# Patient Record
Sex: Female | Born: 1945 | Race: White | Hispanic: No | Marital: Married | Smoking: Former smoker
Health system: Southern US, Community
[De-identification: ages and names within clinical notes are randomized; demographics above are authoritative.]

## PROBLEM LIST (undated history)

## (undated) DIAGNOSIS — F419 Anxiety disorder, unspecified: Secondary | ICD-10-CM

## (undated) DIAGNOSIS — G4700 Insomnia, unspecified: Secondary | ICD-10-CM

## (undated) DIAGNOSIS — I214 Non-ST elevation (NSTEMI) myocardial infarction: Secondary | ICD-10-CM

## (undated) DIAGNOSIS — G4733 Obstructive sleep apnea (adult) (pediatric): Secondary | ICD-10-CM

## (undated) DIAGNOSIS — I472 Ventricular tachycardia: Secondary | ICD-10-CM

## (undated) DIAGNOSIS — Z87440 Personal history of urinary (tract) infections: Secondary | ICD-10-CM

## (undated) DIAGNOSIS — C50919 Malignant neoplasm of unspecified site of unspecified female breast: Secondary | ICD-10-CM

## (undated) DIAGNOSIS — R000 Tachycardia, unspecified: Secondary | ICD-10-CM

## (undated) DIAGNOSIS — R519 Headache, unspecified: Secondary | ICD-10-CM

## (undated) DIAGNOSIS — K5790 Diverticulosis of intestine, part unspecified, without perforation or abscess without bleeding: Secondary | ICD-10-CM

## (undated) DIAGNOSIS — I10 Essential (primary) hypertension: Secondary | ICD-10-CM

## (undated) DIAGNOSIS — M1990 Unspecified osteoarthritis, unspecified site: Secondary | ICD-10-CM

## (undated) DIAGNOSIS — R238 Other skin changes: Secondary | ICD-10-CM

## (undated) DIAGNOSIS — R112 Nausea with vomiting, unspecified: Secondary | ICD-10-CM

## (undated) DIAGNOSIS — R609 Edema, unspecified: Secondary | ICD-10-CM

## (undated) DIAGNOSIS — E785 Hyperlipidemia, unspecified: Secondary | ICD-10-CM

## (undated) DIAGNOSIS — Z8709 Personal history of other diseases of the respiratory system: Secondary | ICD-10-CM

## (undated) DIAGNOSIS — R600 Localized edema: Secondary | ICD-10-CM

## (undated) DIAGNOSIS — R0981 Nasal congestion: Secondary | ICD-10-CM

## (undated) DIAGNOSIS — Z79890 Hormone replacement therapy (postmenopausal): Secondary | ICD-10-CM

## (undated) DIAGNOSIS — K635 Polyp of colon: Secondary | ICD-10-CM

## (undated) DIAGNOSIS — J310 Chronic rhinitis: Secondary | ICD-10-CM

## (undated) DIAGNOSIS — R233 Spontaneous ecchymoses: Secondary | ICD-10-CM

## (undated) DIAGNOSIS — Z9889 Other specified postprocedural states: Secondary | ICD-10-CM

## (undated) DIAGNOSIS — T7840XA Allergy, unspecified, initial encounter: Secondary | ICD-10-CM

## (undated) DIAGNOSIS — K219 Gastro-esophageal reflux disease without esophagitis: Secondary | ICD-10-CM

## (undated) DIAGNOSIS — Z8719 Personal history of other diseases of the digestive system: Secondary | ICD-10-CM

## (undated) DIAGNOSIS — IMO0001 Reserved for inherently not codable concepts without codable children: Secondary | ICD-10-CM

## (undated) DIAGNOSIS — Z87448 Personal history of other diseases of urinary system: Secondary | ICD-10-CM

## (undated) DIAGNOSIS — J189 Pneumonia, unspecified organism: Secondary | ICD-10-CM

## (undated) DIAGNOSIS — Z9882 Breast implant status: Secondary | ICD-10-CM

## (undated) DIAGNOSIS — IMO0002 Reserved for concepts with insufficient information to code with codable children: Secondary | ICD-10-CM

## (undated) DIAGNOSIS — M109 Gout, unspecified: Secondary | ICD-10-CM

## (undated) DIAGNOSIS — I4729 Other ventricular tachycardia: Secondary | ICD-10-CM

## (undated) HISTORY — DX: Anxiety disorder, unspecified: F41.9

## (undated) HISTORY — DX: Gout, unspecified: M10.9

## (undated) HISTORY — DX: Ventricular tachycardia: I47.2

## (undated) HISTORY — PX: EXPLORATORY LAPAROTOMY: SUR591

## (undated) HISTORY — PX: ABDOMINAL HYSTERECTOMY: SHX81

## (undated) HISTORY — DX: Tachycardia, unspecified: R00.0

## (undated) HISTORY — PX: CHOLECYSTECTOMY: SHX55

## (undated) HISTORY — PX: OTHER SURGICAL HISTORY: SHX169

## (undated) HISTORY — PX: COSMETIC SURGERY: SHX468

## (undated) HISTORY — PX: APPENDECTOMY: SHX54

## (undated) HISTORY — PX: TONSILLECTOMY: SUR1361

## (undated) HISTORY — PX: CATARACT EXTRACTION, BILATERAL: SHX1313

## (undated) HISTORY — DX: Malignant neoplasm of unspecified site of unspecified female breast: C50.919

## (undated) HISTORY — DX: Essential (primary) hypertension: I10

## (undated) HISTORY — DX: Other ventricular tachycardia: I47.29

## (undated) HISTORY — DX: Reserved for inherently not codable concepts without codable children: IMO0001

## (undated) HISTORY — PX: COLONOSCOPY: SHX174

## (undated) HISTORY — DX: Breast implant status: Z98.82

## (undated) HISTORY — DX: Obstructive sleep apnea (adult) (pediatric): G47.33

## (undated) HISTORY — DX: Reserved for concepts with insufficient information to code with codable children: IMO0002

## (undated) HISTORY — DX: Polyp of colon: K63.5

## (undated) HISTORY — DX: Hyperlipidemia, unspecified: E78.5

## (undated) HISTORY — PX: GALLBLADDER SURGERY: SHX652

## (undated) HISTORY — DX: Unspecified osteoarthritis, unspecified site: M19.90

---

## 1997-08-17 HISTORY — PX: CARDIAC CATHETERIZATION: SHX172

## 2011-09-09 DIAGNOSIS — M719 Bursopathy, unspecified: Secondary | ICD-10-CM | POA: Diagnosis not present

## 2011-09-09 DIAGNOSIS — M67919 Unspecified disorder of synovium and tendon, unspecified shoulder: Secondary | ICD-10-CM | POA: Diagnosis not present

## 2011-09-09 DIAGNOSIS — M75120 Complete rotator cuff tear or rupture of unspecified shoulder, not specified as traumatic: Secondary | ICD-10-CM | POA: Diagnosis not present

## 2011-09-09 DIAGNOSIS — M79609 Pain in unspecified limb: Secondary | ICD-10-CM | POA: Diagnosis not present

## 2011-09-09 DIAGNOSIS — M25519 Pain in unspecified shoulder: Secondary | ICD-10-CM | POA: Diagnosis not present

## 2011-09-11 DIAGNOSIS — I472 Ventricular tachycardia: Secondary | ICD-10-CM | POA: Diagnosis not present

## 2011-09-11 DIAGNOSIS — J019 Acute sinusitis, unspecified: Secondary | ICD-10-CM | POA: Diagnosis not present

## 2011-09-11 DIAGNOSIS — G4733 Obstructive sleep apnea (adult) (pediatric): Secondary | ICD-10-CM | POA: Diagnosis not present

## 2011-09-11 DIAGNOSIS — I4729 Other ventricular tachycardia: Secondary | ICD-10-CM | POA: Diagnosis not present

## 2011-09-14 DIAGNOSIS — M25819 Other specified joint disorders, unspecified shoulder: Secondary | ICD-10-CM | POA: Diagnosis not present

## 2011-09-14 DIAGNOSIS — M6281 Muscle weakness (generalized): Secondary | ICD-10-CM | POA: Diagnosis not present

## 2011-09-14 DIAGNOSIS — M25619 Stiffness of unspecified shoulder, not elsewhere classified: Secondary | ICD-10-CM | POA: Diagnosis not present

## 2011-09-15 DIAGNOSIS — I472 Ventricular tachycardia: Secondary | ICD-10-CM | POA: Diagnosis not present

## 2011-09-15 DIAGNOSIS — I4729 Other ventricular tachycardia: Secondary | ICD-10-CM | POA: Diagnosis not present

## 2011-09-15 DIAGNOSIS — E7800 Pure hypercholesterolemia, unspecified: Secondary | ICD-10-CM | POA: Diagnosis not present

## 2011-09-15 DIAGNOSIS — G4733 Obstructive sleep apnea (adult) (pediatric): Secondary | ICD-10-CM | POA: Diagnosis not present

## 2011-09-15 DIAGNOSIS — R072 Precordial pain: Secondary | ICD-10-CM | POA: Diagnosis not present

## 2011-09-16 DIAGNOSIS — I472 Ventricular tachycardia: Secondary | ICD-10-CM | POA: Diagnosis not present

## 2011-09-16 DIAGNOSIS — M109 Gout, unspecified: Secondary | ICD-10-CM | POA: Diagnosis not present

## 2011-09-16 DIAGNOSIS — I4729 Other ventricular tachycardia: Secondary | ICD-10-CM | POA: Diagnosis not present

## 2011-09-16 DIAGNOSIS — R5381 Other malaise: Secondary | ICD-10-CM | POA: Diagnosis not present

## 2011-09-16 DIAGNOSIS — D510 Vitamin B12 deficiency anemia due to intrinsic factor deficiency: Secondary | ICD-10-CM | POA: Diagnosis not present

## 2011-09-16 DIAGNOSIS — R5383 Other fatigue: Secondary | ICD-10-CM | POA: Diagnosis not present

## 2011-09-16 DIAGNOSIS — E7800 Pure hypercholesterolemia, unspecified: Secondary | ICD-10-CM | POA: Diagnosis not present

## 2011-09-18 DIAGNOSIS — M6281 Muscle weakness (generalized): Secondary | ICD-10-CM | POA: Diagnosis not present

## 2011-09-18 DIAGNOSIS — M25819 Other specified joint disorders, unspecified shoulder: Secondary | ICD-10-CM | POA: Diagnosis not present

## 2011-09-18 DIAGNOSIS — G4733 Obstructive sleep apnea (adult) (pediatric): Secondary | ICD-10-CM | POA: Diagnosis not present

## 2011-09-18 DIAGNOSIS — I472 Ventricular tachycardia: Secondary | ICD-10-CM | POA: Diagnosis not present

## 2011-09-18 DIAGNOSIS — M25619 Stiffness of unspecified shoulder, not elsewhere classified: Secondary | ICD-10-CM | POA: Diagnosis not present

## 2011-09-18 DIAGNOSIS — R072 Precordial pain: Secondary | ICD-10-CM | POA: Diagnosis not present

## 2011-09-18 DIAGNOSIS — I4729 Other ventricular tachycardia: Secondary | ICD-10-CM | POA: Diagnosis not present

## 2011-09-20 DIAGNOSIS — I472 Ventricular tachycardia: Secondary | ICD-10-CM | POA: Diagnosis not present

## 2011-09-20 DIAGNOSIS — I4729 Other ventricular tachycardia: Secondary | ICD-10-CM | POA: Diagnosis not present

## 2011-09-21 DIAGNOSIS — M25619 Stiffness of unspecified shoulder, not elsewhere classified: Secondary | ICD-10-CM | POA: Diagnosis not present

## 2011-09-21 DIAGNOSIS — M25819 Other specified joint disorders, unspecified shoulder: Secondary | ICD-10-CM | POA: Diagnosis not present

## 2011-09-21 DIAGNOSIS — M6281 Muscle weakness (generalized): Secondary | ICD-10-CM | POA: Diagnosis not present

## 2011-09-22 DIAGNOSIS — I4729 Other ventricular tachycardia: Secondary | ICD-10-CM | POA: Diagnosis not present

## 2011-09-22 DIAGNOSIS — I472 Ventricular tachycardia: Secondary | ICD-10-CM | POA: Diagnosis not present

## 2011-09-22 DIAGNOSIS — R072 Precordial pain: Secondary | ICD-10-CM | POA: Diagnosis not present

## 2011-09-22 DIAGNOSIS — E7800 Pure hypercholesterolemia, unspecified: Secondary | ICD-10-CM | POA: Diagnosis not present

## 2011-09-25 DIAGNOSIS — M25619 Stiffness of unspecified shoulder, not elsewhere classified: Secondary | ICD-10-CM | POA: Diagnosis not present

## 2011-09-25 DIAGNOSIS — M6281 Muscle weakness (generalized): Secondary | ICD-10-CM | POA: Diagnosis not present

## 2011-09-25 DIAGNOSIS — M25819 Other specified joint disorders, unspecified shoulder: Secondary | ICD-10-CM | POA: Diagnosis not present

## 2011-09-30 DIAGNOSIS — M67919 Unspecified disorder of synovium and tendon, unspecified shoulder: Secondary | ICD-10-CM | POA: Diagnosis not present

## 2011-09-30 DIAGNOSIS — M719 Bursopathy, unspecified: Secondary | ICD-10-CM | POA: Diagnosis not present

## 2011-09-30 DIAGNOSIS — D485 Neoplasm of uncertain behavior of skin: Secondary | ICD-10-CM | POA: Diagnosis not present

## 2011-09-30 DIAGNOSIS — M25519 Pain in unspecified shoulder: Secondary | ICD-10-CM | POA: Diagnosis not present

## 2011-09-30 DIAGNOSIS — M25619 Stiffness of unspecified shoulder, not elsewhere classified: Secondary | ICD-10-CM | POA: Diagnosis not present

## 2011-09-30 DIAGNOSIS — M6281 Muscle weakness (generalized): Secondary | ICD-10-CM | POA: Diagnosis not present

## 2011-09-30 DIAGNOSIS — M25819 Other specified joint disorders, unspecified shoulder: Secondary | ICD-10-CM | POA: Diagnosis not present

## 2011-10-05 DIAGNOSIS — M25819 Other specified joint disorders, unspecified shoulder: Secondary | ICD-10-CM | POA: Diagnosis not present

## 2011-10-05 DIAGNOSIS — M25619 Stiffness of unspecified shoulder, not elsewhere classified: Secondary | ICD-10-CM | POA: Diagnosis not present

## 2011-10-05 DIAGNOSIS — M6281 Muscle weakness (generalized): Secondary | ICD-10-CM | POA: Diagnosis not present

## 2011-10-16 DIAGNOSIS — L905 Scar conditions and fibrosis of skin: Secondary | ICD-10-CM | POA: Diagnosis not present

## 2011-10-16 DIAGNOSIS — D485 Neoplasm of uncertain behavior of skin: Secondary | ICD-10-CM | POA: Diagnosis not present

## 2011-10-29 DIAGNOSIS — L723 Sebaceous cyst: Secondary | ICD-10-CM | POA: Diagnosis not present

## 2011-10-29 DIAGNOSIS — D485 Neoplasm of uncertain behavior of skin: Secondary | ICD-10-CM | POA: Diagnosis not present

## 2011-11-03 DIAGNOSIS — M25819 Other specified joint disorders, unspecified shoulder: Secondary | ICD-10-CM | POA: Diagnosis not present

## 2011-11-03 DIAGNOSIS — M25619 Stiffness of unspecified shoulder, not elsewhere classified: Secondary | ICD-10-CM | POA: Diagnosis not present

## 2011-11-03 DIAGNOSIS — M6281 Muscle weakness (generalized): Secondary | ICD-10-CM | POA: Diagnosis not present

## 2011-11-06 DIAGNOSIS — M6281 Muscle weakness (generalized): Secondary | ICD-10-CM | POA: Diagnosis not present

## 2011-11-06 DIAGNOSIS — M25819 Other specified joint disorders, unspecified shoulder: Secondary | ICD-10-CM | POA: Diagnosis not present

## 2011-11-06 DIAGNOSIS — M25619 Stiffness of unspecified shoulder, not elsewhere classified: Secondary | ICD-10-CM | POA: Diagnosis not present

## 2011-11-11 DIAGNOSIS — M25819 Other specified joint disorders, unspecified shoulder: Secondary | ICD-10-CM | POA: Diagnosis not present

## 2011-11-11 DIAGNOSIS — M25619 Stiffness of unspecified shoulder, not elsewhere classified: Secondary | ICD-10-CM | POA: Diagnosis not present

## 2011-11-11 DIAGNOSIS — M6281 Muscle weakness (generalized): Secondary | ICD-10-CM | POA: Diagnosis not present

## 2011-11-16 DIAGNOSIS — E7800 Pure hypercholesterolemia, unspecified: Secondary | ICD-10-CM | POA: Diagnosis not present

## 2011-11-16 DIAGNOSIS — I10 Essential (primary) hypertension: Secondary | ICD-10-CM | POA: Diagnosis not present

## 2011-11-16 DIAGNOSIS — M109 Gout, unspecified: Secondary | ICD-10-CM | POA: Diagnosis not present

## 2011-11-16 DIAGNOSIS — J019 Acute sinusitis, unspecified: Secondary | ICD-10-CM | POA: Diagnosis not present

## 2011-11-24 DIAGNOSIS — L820 Inflamed seborrheic keratosis: Secondary | ICD-10-CM | POA: Diagnosis not present

## 2011-11-26 DIAGNOSIS — M25819 Other specified joint disorders, unspecified shoulder: Secondary | ICD-10-CM | POA: Diagnosis not present

## 2011-11-26 DIAGNOSIS — M25619 Stiffness of unspecified shoulder, not elsewhere classified: Secondary | ICD-10-CM | POA: Diagnosis not present

## 2011-11-26 DIAGNOSIS — M6281 Muscle weakness (generalized): Secondary | ICD-10-CM | POA: Diagnosis not present

## 2011-11-27 DIAGNOSIS — M25819 Other specified joint disorders, unspecified shoulder: Secondary | ICD-10-CM | POA: Diagnosis not present

## 2011-11-27 DIAGNOSIS — M6281 Muscle weakness (generalized): Secondary | ICD-10-CM | POA: Diagnosis not present

## 2011-11-27 DIAGNOSIS — M25619 Stiffness of unspecified shoulder, not elsewhere classified: Secondary | ICD-10-CM | POA: Diagnosis not present

## 2011-12-01 DIAGNOSIS — R7401 Elevation of levels of liver transaminase levels: Secondary | ICD-10-CM | POA: Diagnosis not present

## 2011-12-01 DIAGNOSIS — R7402 Elevation of levels of lactic acid dehydrogenase (LDH): Secondary | ICD-10-CM | POA: Diagnosis not present

## 2012-01-19 DIAGNOSIS — Z7189 Other specified counseling: Secondary | ICD-10-CM | POA: Diagnosis not present

## 2012-01-19 DIAGNOSIS — E7800 Pure hypercholesterolemia, unspecified: Secondary | ICD-10-CM | POA: Diagnosis not present

## 2012-01-20 DIAGNOSIS — H26499 Other secondary cataract, unspecified eye: Secondary | ICD-10-CM | POA: Diagnosis not present

## 2012-01-20 DIAGNOSIS — H04129 Dry eye syndrome of unspecified lacrimal gland: Secondary | ICD-10-CM | POA: Diagnosis not present

## 2012-01-20 DIAGNOSIS — H43819 Vitreous degeneration, unspecified eye: Secondary | ICD-10-CM | POA: Diagnosis not present

## 2012-02-24 DIAGNOSIS — Z124 Encounter for screening for malignant neoplasm of cervix: Secondary | ICD-10-CM | POA: Diagnosis not present

## 2012-02-24 DIAGNOSIS — H26499 Other secondary cataract, unspecified eye: Secondary | ICD-10-CM | POA: Diagnosis not present

## 2012-02-24 DIAGNOSIS — Z1231 Encounter for screening mammogram for malignant neoplasm of breast: Secondary | ICD-10-CM | POA: Diagnosis not present

## 2012-02-24 DIAGNOSIS — M9981 Other biomechanical lesions of cervical region: Secondary | ICD-10-CM | POA: Diagnosis not present

## 2012-02-24 DIAGNOSIS — M542 Cervicalgia: Secondary | ICD-10-CM | POA: Diagnosis not present

## 2012-02-24 DIAGNOSIS — M25559 Pain in unspecified hip: Secondary | ICD-10-CM | POA: Diagnosis not present

## 2012-02-24 DIAGNOSIS — M999 Biomechanical lesion, unspecified: Secondary | ICD-10-CM | POA: Diagnosis not present

## 2012-02-28 DIAGNOSIS — IMO0001 Reserved for inherently not codable concepts without codable children: Secondary | ICD-10-CM | POA: Diagnosis not present

## 2012-04-11 DIAGNOSIS — H26499 Other secondary cataract, unspecified eye: Secondary | ICD-10-CM | POA: Diagnosis not present

## 2012-04-11 DIAGNOSIS — H43399 Other vitreous opacities, unspecified eye: Secondary | ICD-10-CM | POA: Diagnosis not present

## 2012-04-12 DIAGNOSIS — Z23 Encounter for immunization: Secondary | ICD-10-CM | POA: Diagnosis not present

## 2012-04-20 DIAGNOSIS — H04129 Dry eye syndrome of unspecified lacrimal gland: Secondary | ICD-10-CM | POA: Diagnosis not present

## 2012-06-09 ENCOUNTER — Ambulatory Visit (INDEPENDENT_AMBULATORY_CARE_PROVIDER_SITE_OTHER): Payer: Medicare Other | Admitting: Family Medicine

## 2012-06-09 VITALS — BP 136/74 | HR 73 | Temp 97.6°F | Resp 16

## 2012-06-09 DIAGNOSIS — J329 Chronic sinusitis, unspecified: Secondary | ICD-10-CM

## 2012-06-09 DIAGNOSIS — J310 Chronic rhinitis: Secondary | ICD-10-CM

## 2012-06-09 DIAGNOSIS — J029 Acute pharyngitis, unspecified: Secondary | ICD-10-CM | POA: Diagnosis not present

## 2012-06-09 DIAGNOSIS — M109 Gout, unspecified: Secondary | ICD-10-CM | POA: Insufficient documentation

## 2012-06-09 DIAGNOSIS — R000 Tachycardia, unspecified: Secondary | ICD-10-CM | POA: Insufficient documentation

## 2012-06-09 LAB — POCT RAPID STREP A (OFFICE): Rapid Strep A Screen: NEGATIVE

## 2012-06-09 MED ORDER — AMOXICILLIN 875 MG PO TABS: 875.0000 mg | ORAL_TABLET | Freq: Two times a day (BID) | ORAL | Status: DC

## 2012-06-09 MED ORDER — FLUTICASONE PROPIONATE 50 MCG/ACT NA SUSP: 2.0000 | Freq: Every day | NASAL | Status: DC

## 2012-06-09 NOTE — Progress Notes (Signed)
[facility]:  Office Visit  Chief Complaint:  Chief Complaint  Patient presents with  . Sore Throat    x 1 month  grandchildren have strep throat  . Adenopathy  . Headache    HPI: Tracey [name] is a 66 y.o. female who complains of  1 month h/o of sore throat, sinus tenderness, nasal congestion and HA. She ahs had strep exposure from grandchildren. Denies allergies or asthma. Recently moved here from [place]. They are living half time in [place] and also [place] so they can see their granchildren who are here.   Past Medical History  Diagnosis Date  . Arthritis   . Cataract   . Gout   . Tachycardia   . Hyperlipidemia   . Gout    Past Surgical History  Procedure Date  . Appendectomy   . Cosmetic surgery   . Gallbladder surgery   . Abdominal hysterectomy    History   Social History  . Marital Status: Married    Spouse Name: N/A    Number of Children: N/A  . Years of Education: N/A   Social History Main Topics  . Smoking status: Former Smoker    Start date: 06/09/1992  . Smokeless tobacco: None  . Alcohol Use: 3.0 oz/week    5 Glasses of wine per week  . Drug Use: No  . Sexually Active: Yes    Birth Control/ Protection: None   Other Topics Concern  . None   Social History Narrative  . None   Family History  Problem Relation Age of Onset  . Breast cancer Mother   . Cancer Mother   . Heart attack Father   . Heart disease Father   . ALS Sister   . Breast cancer Maternal Grandmother   . Heart attack Maternal Grandfather   . Heart attack Paternal Grandfather    Allergies  Allergen Reactions  . Ivp Dye (Iodinated Diagnostic Agents) Hives  . Sulfa Antibiotics Swelling   Prior to Admission medications   Medication Sig Start Date End Date Taking? Authorizing Provider  ALLOPURINOL PO Take 1 tablet by mouth daily.   Yes Historical Provider, MD  celecoxib (CELEBREX) 50 MG capsule Take 50 mg by mouth daily.   Yes Historical  Provider, MD  METOPROLOL TARTRATE PO Take 1 tablet by mouth daily.   Yes Historical Provider, MD  PRAVASTATIN SODIUM PO Take 1 tablet by mouth daily.   Yes Historical Provider, MD     ROS: The patient denies fevers, chills, night sweats, unintentional weight loss, chest pain, palpitations, wheezing, dyspnea on exertion, nausea, vomiting, abdominal pain, dysuria, hematuria, melena, numbness, weakness, or tingling.   All other systems have been reviewed and were otherwise negative with the exception of those mentioned in the HPI and as above.    PHYSICAL EXAM: Filed Vitals:   06/09/12 1120  BP: 136/74  Pulse: 73  Temp: 97.6 F (36.4 C)  Resp: 16   There were no vitals filed for this visit. There is no height or weight on file to calculate BMI.  General: Alert, no acute distress HEENT:  Normocephalic, atraumatic, oropharynx patent. Erythematous throat, + righ frontal sinus tenderness, Tm nl. No exudates Cardiovascular:  Regular rate and rhythm, no rubs murmurs or gallops.  No Carotid bruits, radial pulse intact. No pedal edema.  Respiratory: Clear to auscultation bilaterally.  No wheezes, rales, or rhonchi.  No cyanosis, no use of accessory musculature GI: No organomegaly, abdomen is soft  and non-tender, positive bowel sounds.  No masses. Skin: No rashes. Neurologic: Facial musculature symmetric. Psychiatric: Patient is appropriate throughout our interaction. Lymphatic: No cervical lymphadenopathy Musculoskeletal: Gait intact.   LABS: Results for orders placed in visit on 06/09/12  POCT RAPID STREP A (OFFICE)      Component Value Range   Rapid Strep A Screen Negative  Negative     EKG/XRAY:   Primary read interpreted by Dr. Conley Rolls at [facility].   ASSESSMENT/PLAN: Encounter Diagnoses  Name Primary?  . Pharyngitis Yes  . Sinusitis   . Rhinitis    Rx Amoxacillin 875 mg BID x 10 days for strep exposure and sinusitis Rx Flonase Rx Hydromet syrup F/u prn    Aneliz Carbary PHUONG,  DO 06/09/2012 12:54 PM

## 2012-07-22 DIAGNOSIS — G4733 Obstructive sleep apnea (adult) (pediatric): Secondary | ICD-10-CM | POA: Diagnosis not present

## 2012-07-22 DIAGNOSIS — K2900 Acute gastritis without bleeding: Secondary | ICD-10-CM | POA: Diagnosis not present

## 2012-07-22 DIAGNOSIS — M159 Polyosteoarthritis, unspecified: Secondary | ICD-10-CM | POA: Diagnosis not present

## 2012-07-22 DIAGNOSIS — I10 Essential (primary) hypertension: Secondary | ICD-10-CM | POA: Diagnosis not present

## 2012-07-22 DIAGNOSIS — E7800 Pure hypercholesterolemia, unspecified: Secondary | ICD-10-CM | POA: Diagnosis not present

## 2012-07-22 DIAGNOSIS — J019 Acute sinusitis, unspecified: Secondary | ICD-10-CM | POA: Diagnosis not present

## 2012-07-22 DIAGNOSIS — M109 Gout, unspecified: Secondary | ICD-10-CM | POA: Diagnosis not present

## 2012-07-28 DIAGNOSIS — J301 Allergic rhinitis due to pollen: Secondary | ICD-10-CM | POA: Diagnosis not present

## 2012-07-28 DIAGNOSIS — I10 Essential (primary) hypertension: Secondary | ICD-10-CM | POA: Diagnosis not present

## 2012-07-28 DIAGNOSIS — R7402 Elevation of levels of lactic acid dehydrogenase (LDH): Secondary | ICD-10-CM | POA: Diagnosis not present

## 2012-07-28 DIAGNOSIS — G4733 Obstructive sleep apnea (adult) (pediatric): Secondary | ICD-10-CM | POA: Diagnosis not present

## 2012-07-28 DIAGNOSIS — R7401 Elevation of levels of liver transaminase levels: Secondary | ICD-10-CM | POA: Diagnosis not present

## 2012-08-15 DIAGNOSIS — M659 Synovitis and tenosynovitis, unspecified: Secondary | ICD-10-CM | POA: Diagnosis not present

## 2012-08-15 DIAGNOSIS — M6530 Trigger finger, unspecified finger: Secondary | ICD-10-CM | POA: Diagnosis not present

## 2012-08-17 DIAGNOSIS — C50919 Malignant neoplasm of unspecified site of unspecified female breast: Secondary | ICD-10-CM

## 2012-08-17 HISTORY — DX: Malignant neoplasm of unspecified site of unspecified female breast: C50.919

## 2012-08-22 DIAGNOSIS — J019 Acute sinusitis, unspecified: Secondary | ICD-10-CM | POA: Diagnosis not present

## 2012-08-29 DIAGNOSIS — I789 Disease of capillaries, unspecified: Secondary | ICD-10-CM | POA: Diagnosis not present

## 2012-08-29 DIAGNOSIS — L659 Nonscarring hair loss, unspecified: Secondary | ICD-10-CM | POA: Diagnosis not present

## 2012-08-29 DIAGNOSIS — D485 Neoplasm of uncertain behavior of skin: Secondary | ICD-10-CM | POA: Diagnosis not present

## 2012-09-01 DIAGNOSIS — M25559 Pain in unspecified hip: Secondary | ICD-10-CM | POA: Diagnosis not present

## 2012-09-01 DIAGNOSIS — M25569 Pain in unspecified knee: Secondary | ICD-10-CM | POA: Diagnosis not present

## 2012-09-02 DIAGNOSIS — M25569 Pain in unspecified knee: Secondary | ICD-10-CM | POA: Diagnosis not present

## 2012-09-02 DIAGNOSIS — M1710 Unilateral primary osteoarthritis, unspecified knee: Secondary | ICD-10-CM | POA: Diagnosis not present

## 2012-09-02 DIAGNOSIS — M25469 Effusion, unspecified knee: Secondary | ICD-10-CM | POA: Diagnosis not present

## 2012-09-02 DIAGNOSIS — IMO0002 Reserved for concepts with insufficient information to code with codable children: Secondary | ICD-10-CM | POA: Diagnosis not present

## 2012-09-14 DIAGNOSIS — I789 Disease of capillaries, unspecified: Secondary | ICD-10-CM | POA: Diagnosis not present

## 2012-09-14 DIAGNOSIS — L678 Other hair color and hair shaft abnormalities: Secondary | ICD-10-CM | POA: Diagnosis not present

## 2012-09-14 DIAGNOSIS — D485 Neoplasm of uncertain behavior of skin: Secondary | ICD-10-CM | POA: Diagnosis not present

## 2012-09-14 DIAGNOSIS — L738 Other specified follicular disorders: Secondary | ICD-10-CM | POA: Diagnosis not present

## 2012-09-14 DIAGNOSIS — D2330 Other benign neoplasm of skin of unspecified part of face: Secondary | ICD-10-CM | POA: Diagnosis not present

## 2012-09-14 DIAGNOSIS — L259 Unspecified contact dermatitis, unspecified cause: Secondary | ICD-10-CM | POA: Diagnosis not present

## 2012-09-14 DIAGNOSIS — L650 Telogen effluvium: Secondary | ICD-10-CM | POA: Diagnosis not present

## 2012-11-10 DIAGNOSIS — L719 Rosacea, unspecified: Secondary | ICD-10-CM | POA: Diagnosis not present

## 2012-11-10 DIAGNOSIS — L820 Inflamed seborrheic keratosis: Secondary | ICD-10-CM | POA: Diagnosis not present

## 2012-11-10 DIAGNOSIS — D485 Neoplasm of uncertain behavior of skin: Secondary | ICD-10-CM | POA: Diagnosis not present

## 2012-11-25 DIAGNOSIS — R5382 Chronic fatigue, unspecified: Secondary | ICD-10-CM | POA: Diagnosis not present

## 2012-11-25 DIAGNOSIS — G9332 Myalgic encephalomyelitis/chronic fatigue syndrome: Secondary | ICD-10-CM | POA: Diagnosis not present

## 2013-01-25 ENCOUNTER — Ambulatory Visit: Payer: Medicare Other

## 2013-01-25 ENCOUNTER — Ambulatory Visit (INDEPENDENT_AMBULATORY_CARE_PROVIDER_SITE_OTHER): Payer: Medicare Other | Admitting: Family Medicine

## 2013-01-25 VITALS — BP 144/76 | HR 72 | Temp 98.0°F | Resp 17 | Ht 63.0 in | Wt 149.0 lb

## 2013-01-25 DIAGNOSIS — M19049 Primary osteoarthritis, unspecified hand: Secondary | ICD-10-CM | POA: Diagnosis not present

## 2013-01-25 DIAGNOSIS — M79609 Pain in unspecified limb: Secondary | ICD-10-CM

## 2013-01-25 DIAGNOSIS — M79641 Pain in right hand: Secondary | ICD-10-CM

## 2013-01-25 IMAGING — CR DG HAND COMPLETE 3+V*R*
3 series · 3 of 3 positions shown · non-contrast
Comparison: None

CLINICAL DATA: Right hand pain

RIGHT HAND - COMPLETE 3+ VIEW

[PA]
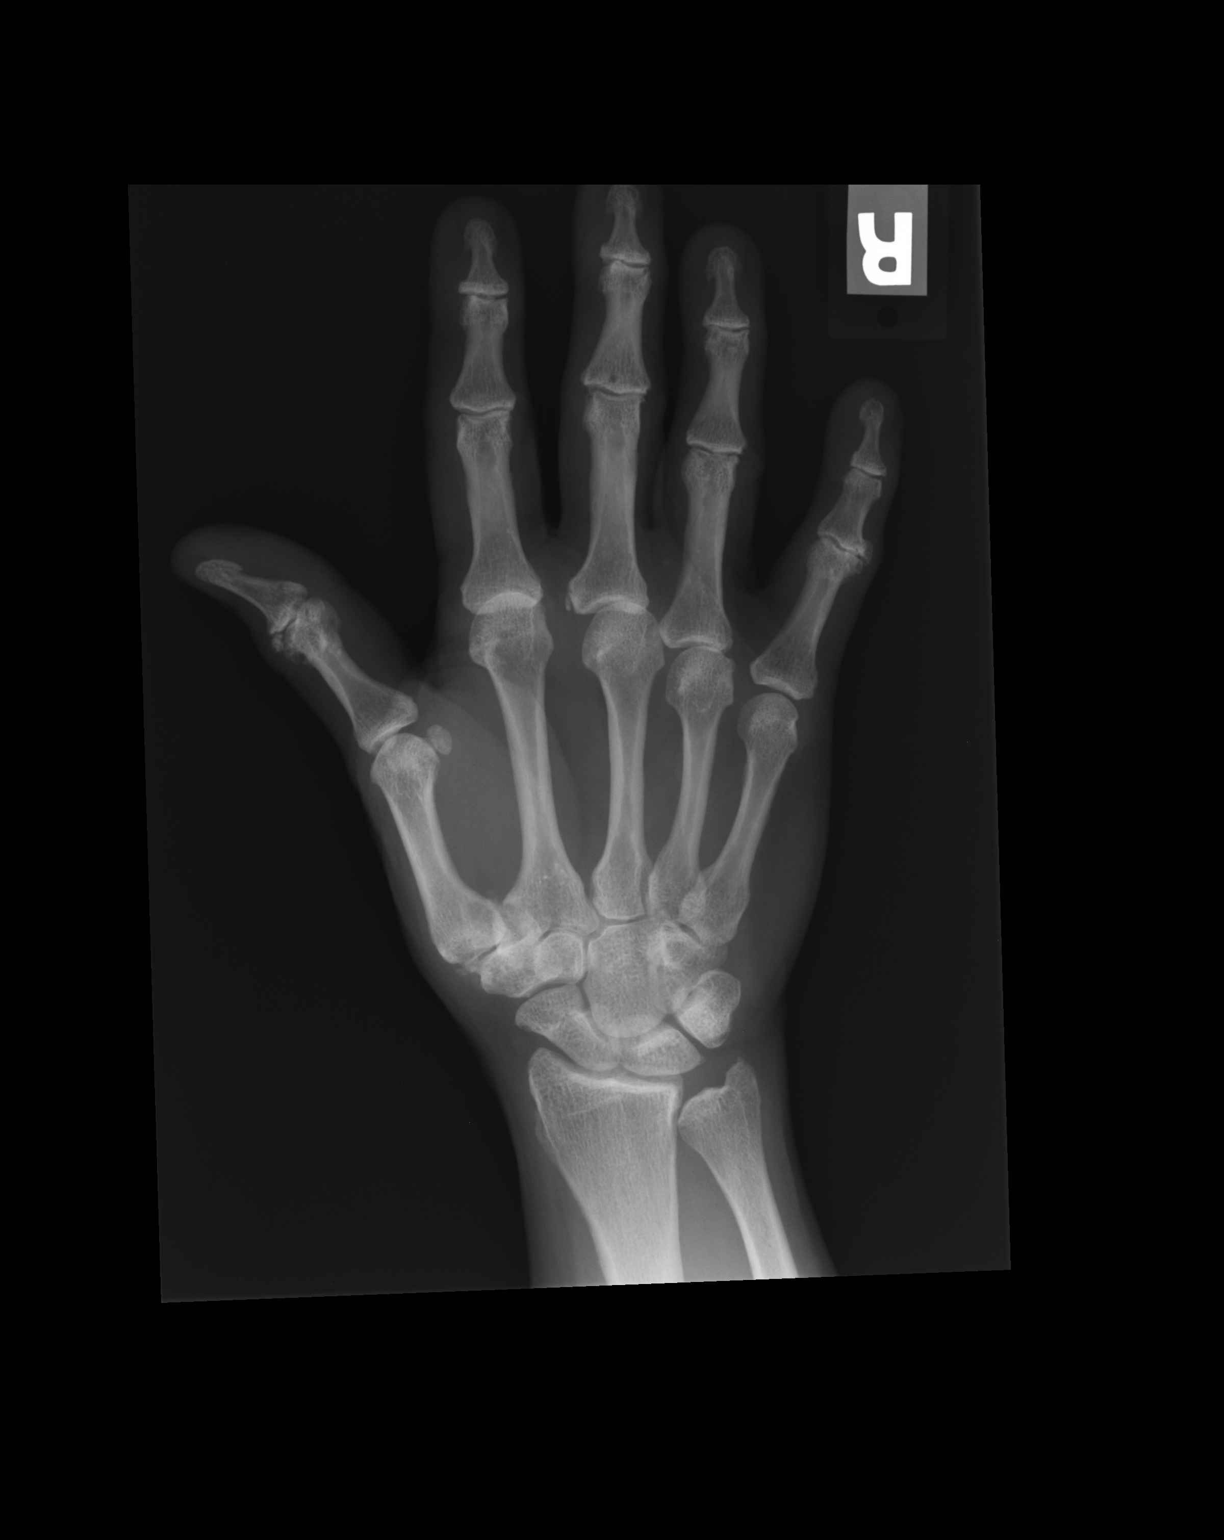

[lateral]
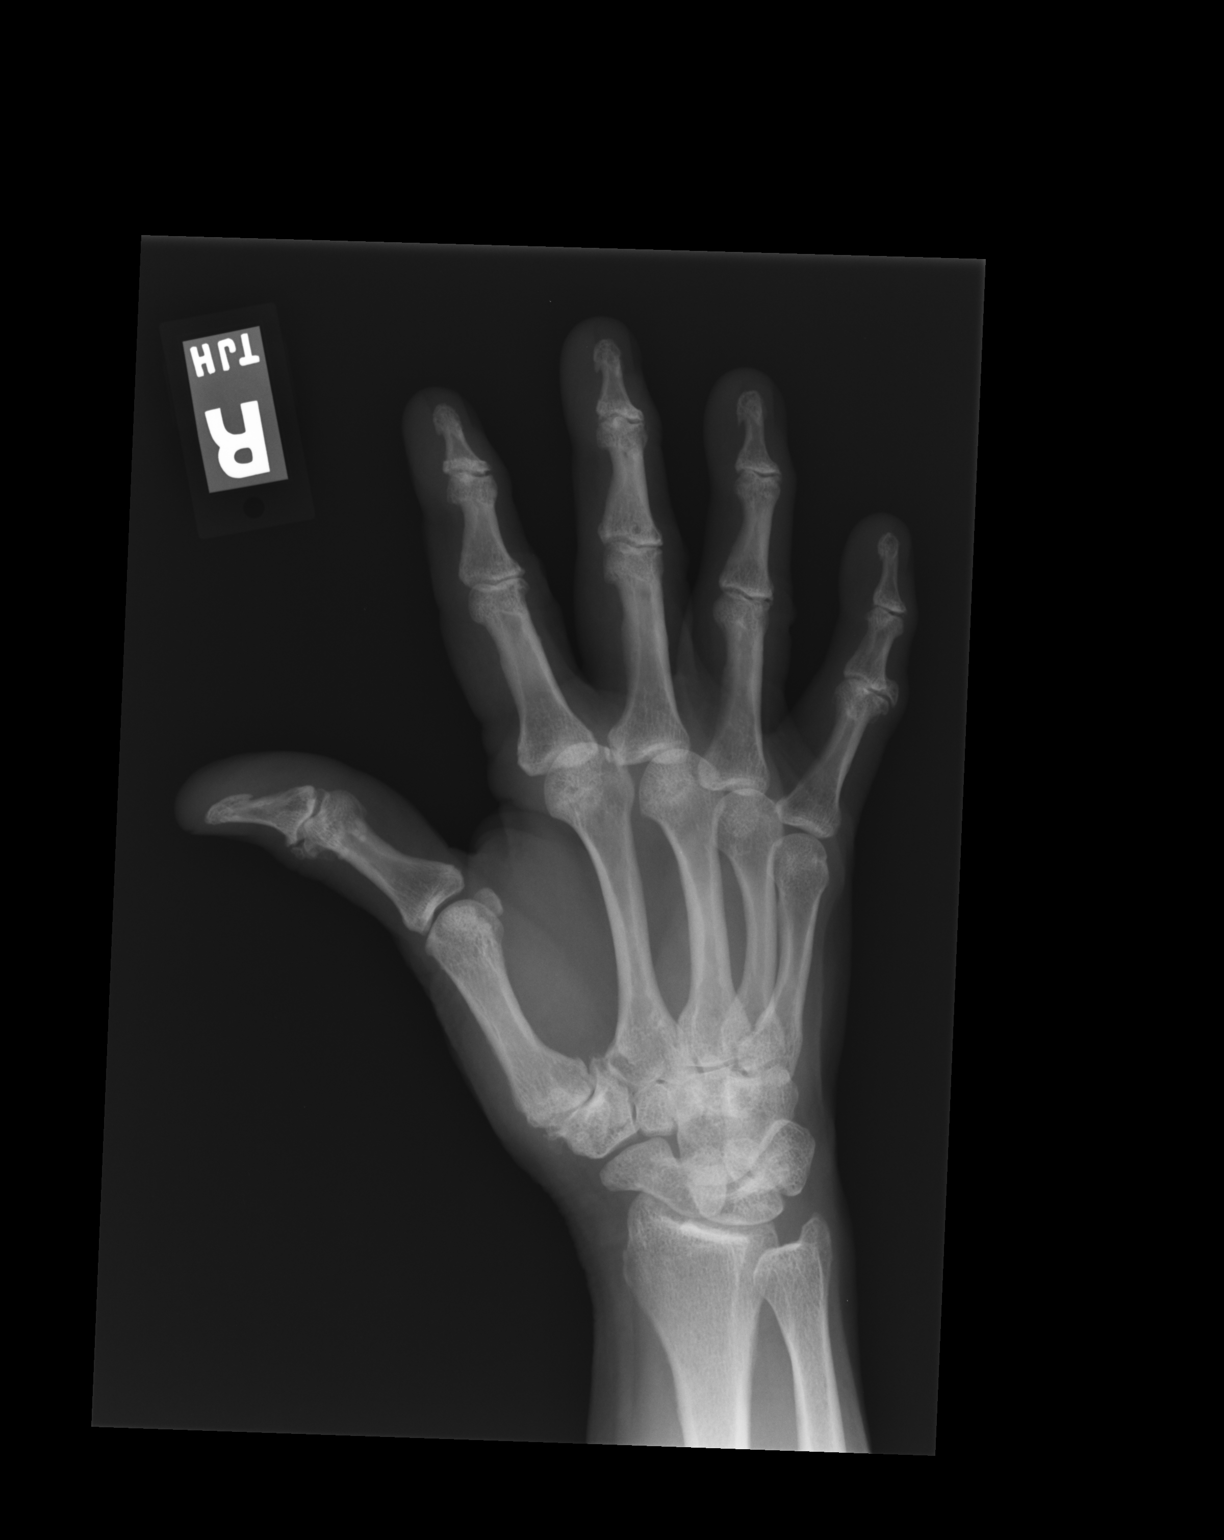

[pa obl]
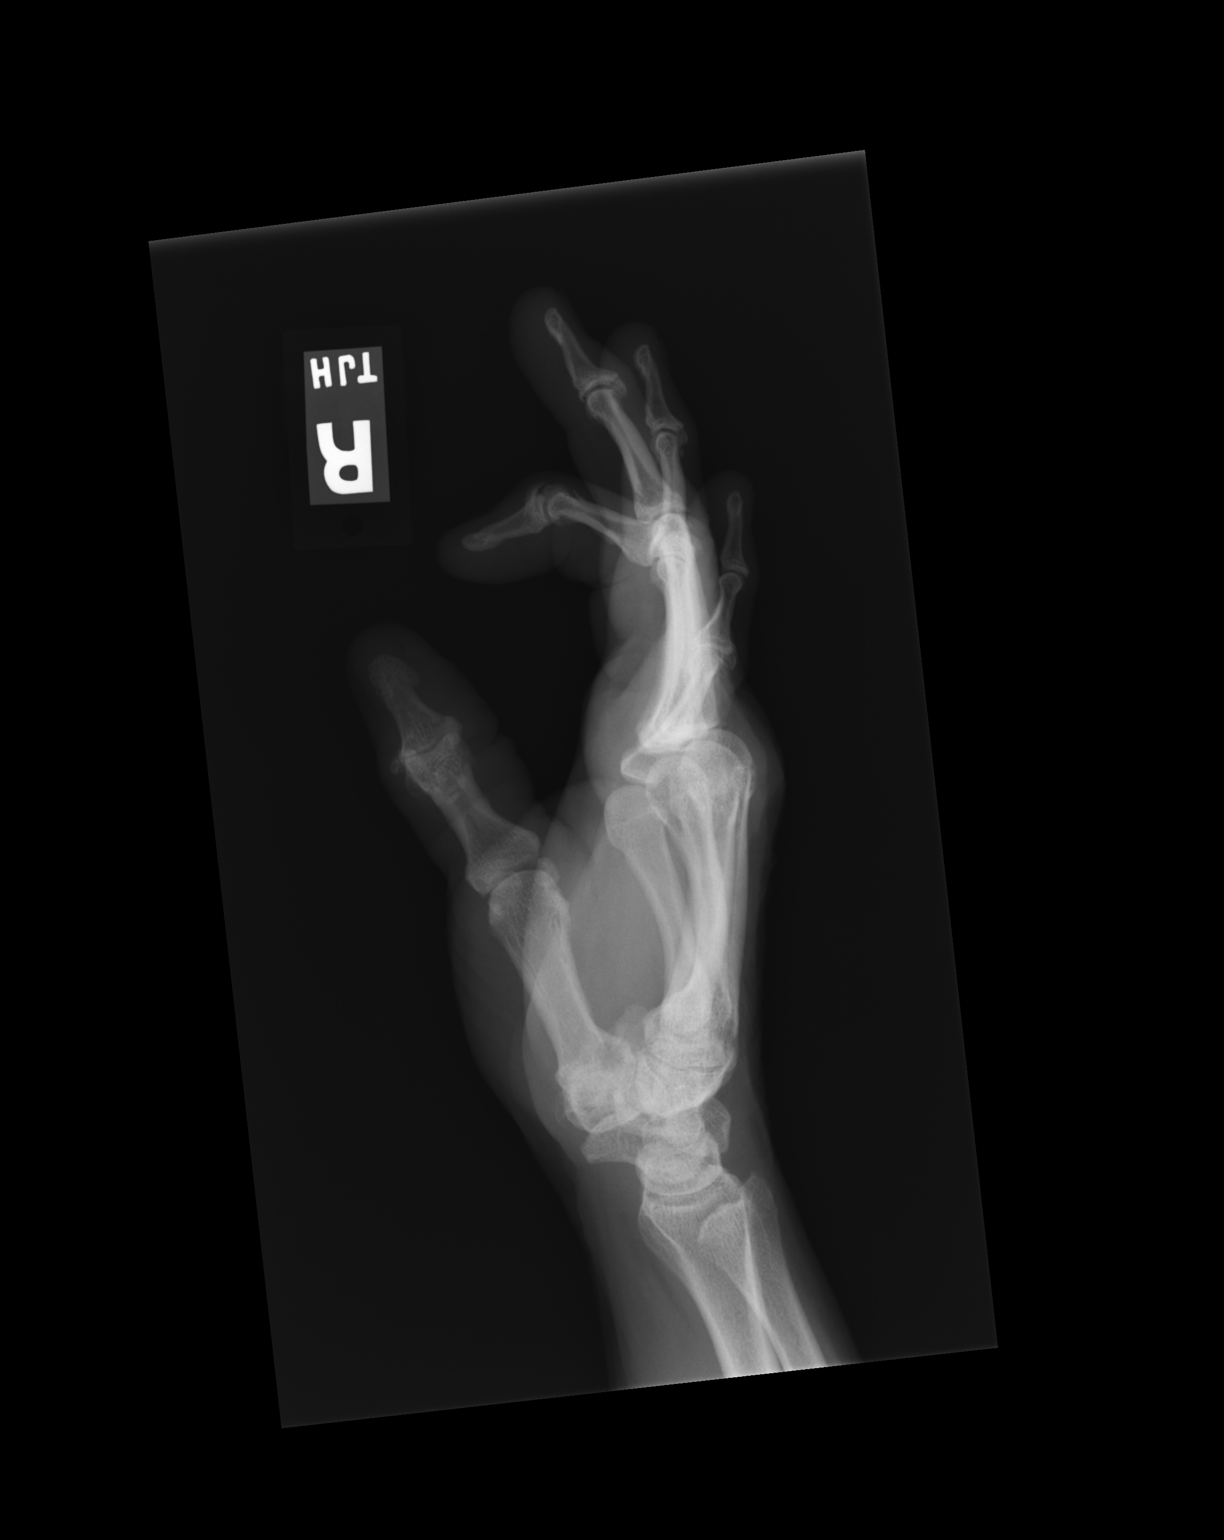

[3 of 3 positions shown; findings below may reference images not displayed]

FINDINGS: The soft tissues appear normal.  Bone mineralization is
within normal limits.  Mild osteoarthritis is noted involving the
DIP joints as well as the fifth PIP joint and the third and fourth
MCP joints.  There is also mild narrowing of the radiocarpal joint.
No acute fractures or subluxations identified.  No radiopaque
foreign bodies or soft tissue calcifications.  No focal bony
erosions.
IMPRESSION: 1.  Osteoarthritis.
2.  No acute findings.

Clinically significant discrepancy from primary report, if
provided: None

## 2013-01-25 NOTE — Progress Notes (Signed)
[facility] [address], [place] [address] [phone]  Date:  01/25/2013   Name:  Tracey [name]   DOB:  Jan 25, 1946   MRN:  [id number]  PCP:  No primary provider on file.    Chief Complaint: Hand Pain   History of Present Illness:  Tracey [name] is a 67 y.o. very pleasant female patient who presents with the following:  She is here today to evaluate right hand pain.   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    She notes pain in her right first MCP and right thumb- this seemed to occur after her husband helped her up from the floor by pulling on her right hand about one week ago.  It reminds her of when she had skier's thumb in the past.  It is difficult for her to turn a doorknob or otherwise rotate the wrist.   The hand will swell up and throb- the swelling comes and goes, generally worst over the thenar eminence and 2nd MCP joints.   She will be on a golf trip next week and is concerned that her hand pain will not allow her to pl She first had this problem in December of last year- she was given a cortisone shot in her hand.    Patient Active Problem List   Diagnosis Date Noted  . Gout   . Tachycardia     Past Medical History  Diagnosis Date  . Arthritis   . Cataract   . Gout   . Tachycardia   . Hyperlipidemia   . Gout     Past Surgical History  Procedure Laterality Date  . Appendectomy    . Cosmetic surgery    . Gallbladder surgery    . Abdominal hysterectomy    . Cholecystectomy      History  Substance Use Topics  . Smoking status: Former Smoker    Start date: 06/09/1992  . Smokeless tobacco: Not on file  . Alcohol Use: 3.0 oz/week    5 Glasses of wine per week    Family History  Problem Relation Age of Onset  . Breast cancer Mother   . Cancer Mother   . Heart attack Father   . Heart disease Father   . ALS Sister   . Breast cancer Maternal Grandmother   . Heart attack  Maternal Grandfather   . Heart attack Paternal Grandfather     Allergies  Allergen Reactions  . Ivp Dye (Iodinated Diagnostic Agents) Hives  . Sulfa Antibiotics Swelling    Medication list has been reviewed and updated.  Current Outpatient Prescriptions on File Prior to Visit  Medication Sig Dispense Refill  . ALLOPURINOL PO Take 1 tablet by mouth daily.      . celecoxib (CELEBREX) 50 MG capsule Take 50 mg by mouth daily.      . fluticasone (FLONASE) 50 MCG/ACT nasal spray Place 2 sprays into the nose daily.  16 g  0  . METOPROLOL TARTRATE PO Take 1 tablet by mouth daily.      Marland Kitchen PRAVASTATIN SODIUM PO Take 1 tablet by mouth daily.       No current facility-administered medications on file prior to visit.    Review of Systems:  As per HPI- otherwise negative.   Physical Examination: Filed Vitals:   01/25/13 0920  BP: 144/76  Pulse: 72  Temp: 98 F (36.7 C)  Resp:  17   Filed Vitals:   01/25/13 0920  Height: 5\' 3"  (1.6 m)  Weight: 149 lb (67.586 kg)   Body mass index is 26.4 kg/(m^2). Ideal Body Weight: Weight in (lb) to have BMI = 25: 140.8  GEN: WDWN, NAD, Non-toxic, A & O x 3, looks well HEENT: Atraumatic, Normocephalic. Neck supple. No masses, No LAD. Ears and Nose: No external deformity. CV: RRR, No M/G/R. No JVD. No thrill. No extra heart sounds. PULM: CTA B, no wheezes, crackles, rhonchi. No retractions. No resp. distress. No accessory muscle use. EXTR: No c/c/e NEURO Normal gait.  PSYCH: Normally interactive. Conversant. Not depressed or anxious appearing.  Calm demeanor.  Right hand: she has hypertrophy of the 2nd MCP joint. OA changes of the DIP joints diffusely with reduced flexion.  Slight ulnar deviation of the fingers.  Wrist is normal.    [facility] reading (PRIMARY) by  Dr. Patsy Lager. Right hand: degenerative changes at the 2nd MCP, and the IP joints especailly at the thumb Assessment and Plan: Hand pain, right - Plan: DG Hand Complete Right  Tracey [name] is  here with right hand pain today.  Suspect she would improve with rest and splinting, but she is eager to be able to play golf next week if possible. She will be seen by Dr. Merlyn Lot this afternoon at the [facility]- appreciate his kind consultation regarding this patient today.    Signed Abbe Amsterdam, MD

## 2013-01-25 NOTE — Patient Instructions (Addendum)
You will be seen at the [facility]- please arrive at 12:30 for a 1pm appt  [facility] Address: [address] [place], [place] [address] [phone]   You will see Dr. Betha Loa

## 2013-03-24 DIAGNOSIS — R928 Other abnormal and inconclusive findings on diagnostic imaging of breast: Secondary | ICD-10-CM | POA: Diagnosis not present

## 2013-03-24 DIAGNOSIS — Z124 Encounter for screening for malignant neoplasm of cervix: Secondary | ICD-10-CM | POA: Diagnosis not present

## 2013-03-24 DIAGNOSIS — Z1231 Encounter for screening mammogram for malignant neoplasm of breast: Secondary | ICD-10-CM | POA: Diagnosis not present

## 2013-03-24 DIAGNOSIS — C50919 Malignant neoplasm of unspecified site of unspecified female breast: Secondary | ICD-10-CM | POA: Diagnosis not present

## 2013-03-27 DIAGNOSIS — C50919 Malignant neoplasm of unspecified site of unspecified female breast: Secondary | ICD-10-CM | POA: Diagnosis not present

## 2013-03-29 DIAGNOSIS — C50919 Malignant neoplasm of unspecified site of unspecified female breast: Secondary | ICD-10-CM | POA: Diagnosis not present

## 2013-03-30 ENCOUNTER — Telehealth: Payer: Self-pay | Admitting: *Deleted

## 2013-03-30 DIAGNOSIS — C50111 Malignant neoplasm of central portion of right female breast: Secondary | ICD-10-CM

## 2013-03-30 DIAGNOSIS — C50119 Malignant neoplasm of central portion of unspecified female breast: Secondary | ICD-10-CM | POA: Insufficient documentation

## 2013-03-30 NOTE — Telephone Encounter (Signed)
Received message from patient stating to call her daughter [name] with her appt.  Called and spoke with [name] and confirmed [facility] appt.for 04/05/13 at 0800.  Instructions and contact information given.  Emailed intake form and before appt. Letter to her daughter [name].  I also have requested pathology slides and imaging from [facility] in [place].

## 2013-04-03 ENCOUNTER — Other Ambulatory Visit: Payer: Self-pay

## 2013-04-03 DIAGNOSIS — C50919 Malignant neoplasm of unspecified site of unspecified female breast: Secondary | ICD-10-CM | POA: Diagnosis not present

## 2013-04-05 ENCOUNTER — Other Ambulatory Visit (HOSPITAL_BASED_OUTPATIENT_CLINIC_OR_DEPARTMENT_OTHER): Payer: Medicare Other | Admitting: Lab

## 2013-04-05 ENCOUNTER — Ambulatory Visit
Admission: RE | Admit: 2013-04-05 | Discharge: 2013-04-05 | Disposition: A | Discharge status: Home / Self Care | Payer: Medicare Other | Source: Ambulatory Visit | Attending: Radiation Oncology | Admitting: Radiation Oncology

## 2013-04-05 ENCOUNTER — Encounter: Payer: Self-pay | Admitting: Oncology

## 2013-04-05 ENCOUNTER — Ambulatory Visit (HOSPITAL_BASED_OUTPATIENT_CLINIC_OR_DEPARTMENT_OTHER): Payer: Medicare Other | Admitting: General Surgery

## 2013-04-05 ENCOUNTER — Telehealth: Payer: Self-pay | Admitting: Oncology

## 2013-04-05 ENCOUNTER — Encounter: Payer: Self-pay | Admitting: *Deleted

## 2013-04-05 ENCOUNTER — Encounter (INDEPENDENT_AMBULATORY_CARE_PROVIDER_SITE_OTHER): Payer: Self-pay | Admitting: General Surgery

## 2013-04-05 ENCOUNTER — Ambulatory Visit: Payer: Medicare Other | Attending: General Surgery | Admitting: Physical Therapy

## 2013-04-05 ENCOUNTER — Ambulatory Visit (HOSPITAL_BASED_OUTPATIENT_CLINIC_OR_DEPARTMENT_OTHER): Payer: Medicare Other | Admitting: Oncology

## 2013-04-05 ENCOUNTER — Ambulatory Visit: Payer: Medicare Other

## 2013-04-05 VITALS — BP 147/80 | HR 76 | Temp 97.6°F | Resp 20 | Ht 63.0 in | Wt 152.1 lb

## 2013-04-05 DIAGNOSIS — C50919 Malignant neoplasm of unspecified site of unspecified female breast: Secondary | ICD-10-CM | POA: Diagnosis not present

## 2013-04-05 DIAGNOSIS — Z170 Estrogen receptor positive status [ER+]: Secondary | ICD-10-CM | POA: Diagnosis not present

## 2013-04-05 DIAGNOSIS — C50911 Malignant neoplasm of unspecified site of right female breast: Secondary | ICD-10-CM

## 2013-04-05 DIAGNOSIS — C50419 Malignant neoplasm of upper-outer quadrant of unspecified female breast: Secondary | ICD-10-CM

## 2013-04-05 DIAGNOSIS — IMO0001 Reserved for inherently not codable concepts without codable children: Secondary | ICD-10-CM | POA: Insufficient documentation

## 2013-04-05 DIAGNOSIS — R293 Abnormal posture: Secondary | ICD-10-CM | POA: Diagnosis not present

## 2013-04-05 DIAGNOSIS — C50111 Malignant neoplasm of central portion of right female breast: Secondary | ICD-10-CM

## 2013-04-05 DIAGNOSIS — C50119 Malignant neoplasm of central portion of unspecified female breast: Secondary | ICD-10-CM

## 2013-04-05 LAB — COMPREHENSIVE METABOLIC PANEL (CC13)
Albumin: 3.9 g/dL (ref 3.5–5.0)
CO2: 23 mEq/L (ref 22–29)
Calcium: 9.5 mg/dL (ref 8.4–10.4)
Glucose: 115 mg/dl (ref 70–140)
Potassium: 4.1 mEq/L (ref 3.5–5.1)
Sodium: 141 mEq/L (ref 136–145)
Total Bilirubin: 0.5 mg/dL (ref 0.20–1.20)
Total Protein: 7.4 g/dL (ref 6.4–8.3)

## 2013-04-05 LAB — CBC WITH DIFFERENTIAL/PLATELET
Eosinophils Absolute: 0.1 10*3/uL (ref 0.0–0.5)
LYMPH%: 39.3 % (ref 14.0–49.7)
MONO#: 0.4 10*3/uL (ref 0.1–0.9)
NEUT#: 2.4 10*3/uL (ref 1.5–6.5)
Platelets: 181 10*3/uL (ref 145–400)
RBC: 5.07 10*6/uL (ref 3.70–5.45)
WBC: 4.9 10*3/uL (ref 3.9–10.3)

## 2013-04-05 NOTE — Progress Notes (Signed)
Checked in new pt with no financial concerns. °

## 2013-04-05 NOTE — Progress Notes (Signed)
I met with the patient and her family today. She is interested in bilateral mastectomies. She understands the role of radiation after mastectomy is reserved for patients with tumor larger than 5 cm or positive lymph nodes none of which he has. She has no indications for radiation at this time. We'll be happy to meet with her postsurgically with her pathologic stage changes. It would be fine for her to proceed forward with immediate reconstruction.

## 2013-04-05 NOTE — Progress Notes (Signed)
Patient ID: Tracey [name], female   DOB: 1946/06/02, 67 y.o.   MRN: [id number]  No chief complaint on file.   HPI Tracey [name] is a 67 y.o. female.   HPI  She is self-referred. She has a new diagnosis of invasive ductal carcinoma of the right breast. She had screening detected abnormality in the right breast. She normally gets her mammograms in [place] and she was there for her mammogram. Biopsy was performed under image guidance and demonstrated invasive ductal carcinoma.  It is estrogen receptor positive. It is a grade 1. It is in the upper outer quadrant of the right breast it measures approximately 4 mm by ultrasound. She is a family history of breast cancer in her mother, grandmother, great grandmother, and maternal aunt. She does have some intermittent left breast pain and no nipple discharge. She has been on hormone replacement therapy for over 30 years. Age at first mesh. 12. Age of first rubber 24. She had a hysterectomy at age 16.  Past Medical History  Diagnosis Date  . Arthritis   . Cataract   . Gout   . Tachycardia   . Hyperlipidemia   . Gout     Past Surgical History  Procedure Laterality Date  . Appendectomy    . Gallbladder surgery    . Abdominal hysterectomy    . Cholecystectomy    . Cosmetic surgery      Family History  Problem Relation Age of Onset  . Breast cancer Mother   . Cancer Mother   . Heart attack Father   . Heart disease Father   . ALS Sister   . Breast cancer Maternal Grandmother   . Heart attack Maternal Grandfather   . Heart attack Paternal Grandfather     Social History History  Substance Use Topics  . Smoking status: Former Smoker    Start date: 06/09/1992  . Smokeless tobacco: Not on file  . Alcohol Use: 3.0 oz/week    5 Glasses of wine per week    Allergies  Allergen Reactions  . Ivp Dye [Iodinated Diagnostic Agents] Hives  . Sulfa Antibiotics Swelling    Current Outpatient Prescriptions  Medication Sig Dispense  Refill  . ALLOPURINOL PO Take 1 tablet by mouth daily.      . fluticasone (FLONASE) 50 MCG/ACT nasal spray Place 2 sprays into the nose daily.  16 g  0  . METOPROLOL TARTRATE PO Take 1 tablet by mouth daily.       No current facility-administered medications for this visit.    Review of Systems Review of Systems  Constitutional: Negative.   HENT: Negative.   Eyes: Negative.   Respiratory: Negative.   Cardiovascular: Positive for palpitations and leg swelling.  Gastrointestinal:       Heartburn.  Genitourinary: Negative.   Musculoskeletal: Positive for arthralgias.  Skin: Negative.        Bruise easily.  Neurological: Negative.   Hematological: Bruises/bleeds easily.  Psychiatric/Behavioral:       Anxious.    There were no vitals taken for this visit.  Physical Exam Physical Exam  Constitutional: No distress.  Overweight.  HENT:  Head: Normocephalic and atraumatic.  Eyes: No scleral icterus.  Neck: Neck supple.  No supraclavicular adenopathy.  Cardiovascular: Normal rate and regular rhythm.   Pulmonary/Chest: Effort normal and breath sounds normal.  Small healing wound upper-outer quadrant right breast. No palpable masses in the right breast. No palpable masses in the left breast. No nipple  discharge present.  Abdominal: Soft. She exhibits no distension and no mass. There is no tenderness.  Lower midline scar.  Musculoskeletal: She exhibits no edema.  Lymphadenopathy:    She has no cervical adenopathy.  Neurological: She is alert.  Skin: Skin is warm and dry.    Data Reviewed Mammogram, [place], Pathology.  Assessment    Invasive right breast cancer, T1 lesion. We discussed surgical options including breast conservation as well as mastectomy.  These operations were discussed with her as well as need for a sentinel lymph node biopsy.   She had discussed this previously with her family. She is leaning toward having bilateral mastectomies and is interested in  reconstruction. I explained to her she would need a right axilla sentinel lymph node biopsy as well.     Plan    Bilateral mastectomies, right axillary sentinel lymph node biopsy. She's also interested in reconstruction. We'll make a referral to a Engineer, petroleum.   I have explained the procedure, risks, and aftercare to her.  Risks include but are not limited to bleeding, infection, wound problems, anesthesia, chronic chest wall pain, nerve injury, seroma formation, lymphedema.  She seems to understand and agrees with the plan.       Kawthar Ennen J 04/05/2013, 10:06 AM

## 2013-04-05 NOTE — Progress Notes (Signed)
ID: Tracey [name] OB: 28-Jun-1946  MR#: [id number]  WUX#:[id number]  PCP: No PCP Per Patient GYN:   SU: Avel Peace OTHER MD: Lurline Hare, Larwance Sachs (FAX [phone])   HISTORY OF PRESENT ILLNESS: [name] had routine screening mammography July of 2014 showing a suspicious mass in her right breast. Additional views confirmed a mass in the upper outer quadrant of the right breast, which by ultrasound measured 4 mm. Biopsy of this mass was obtained in [place], at [facility]. It showed (accession number [id number]) an invasive ductal carcinoma measuring 6 mm on the biopsy, grade 1, strongly estrogen and progesterone receptor positive, HER-2 nonamplified.  The patient's subsequent history is as detailed below  INTERVAL HISTORY: Tracey [name] was seen at the multidisciplinary breast cancer clinic 04/05/2013 accompanied by her husband Tracey [name] and her daughter Tracey [name]  REVIEW OF SYSTEMS: There were no specific symptoms leading to the initial mammogram, which was routine. The patient plays golf twice a week has remained formal exercise. She does have a history of palpitations, including a round of V. tach which was noted during a sleep apnea study. She also gets palpitations with anxiety. She has had no further problems since starting metoprolol. She has heartburn issues, feels bloated, has occasional loose bowel movements, as scattered arthritic pains which are not more frequent or intense than usual, and has a history of gout him a controlled on allopurinol. Otherwise it detailed review of systems today was noncontributory  PAST MEDICAL HISTORY: Past Medical History  Diagnosis Date  . Arthritis   . Cataract   . Gout   . Tachycardia   . Hyperlipidemia   . Gout   . Anxiety     PAST SURGICAL HISTORY: Past Surgical History  Procedure Laterality Date  . Appendectomy    . Gallbladder surgery    . Abdominal hysterectomy    . Cholecystectomy    . Cosmetic surgery      FAMILY  HISTORY Family History  Problem Relation Age of Onset  . Breast cancer Mother   . Cancer Mother   . Heart attack Father   . Heart disease Father   . ALS Sister   . Breast cancer Maternal Grandmother   . Heart attack Maternal Grandfather   . Heart attack Paternal Grandfather    the patient's father died from heart disease at the age of 6. The patient's mother died from breast cancer at the age of 14. It is not clear when she was diagnosed since she "dictated and". The patient had no brothers. One sister died from ALS at the age of 85 in addition, one of the patient's mother is sisters was diagnosed with breast cancer in her 68s. The patient's mother is mother, was also diagnosed with breast cancer, at the age of 98. The patient's daughter, Tracey [name], has been tested for the BRCA genes and was negative.  GYNECOLOGIC HISTORY:  Menarche age 75, first live birth age 81, the patient is GX P2. She underwent total abdominal hysterectomy and bilateral salpingo-oophorectomy at the age of 60 for endometriosis. She took hormone replacement until August of 2014.  SOCIAL HISTORY:  Tracey [name] recently moved to [facility] and is planning to make this area her "home-based", although they also have a home in [place] where to go for the winter to ski. 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 They are now enjoying her retirement. Daughter Tracey [name] lives in [place] where  she is a younger in structure daughter Tracey [name] is a homemaker in [place]. The patient has 3 grandchildren. She is a Investment banker, operational.    ADVANCED DIRECTIVES: In place   HEALTH MAINTENANCE: History  Substance Use Topics  . Smoking status: Former Smoker    Start date: 06/09/1992  . Smokeless tobacco: Not on file  . Alcohol Use: 3.0 oz/week    5 Glasses of wine per week     Colonoscopy: 2010  PAP: August 2014  Bone density: 2010, normal per patient  Lipid  panel:  Allergies  Allergen Reactions  . Ivp Dye [Iodinated Diagnostic Agents] Hives  . Sulfa Antibiotics Swelling    Current Outpatient Prescriptions  Medication Sig Dispense Refill  . allopurinol (ZYLOPRIM) 100 MG tablet Take 100 mg by mouth daily.      . celecoxib (CELEBREX) 200 MG capsule Take 200 mg by mouth daily.      . colchicine (COLCRYS) 0.6 MG tablet Take 0.6 mg by mouth as needed.      . Cyanocobalamin (VITAMIN B-12 IJ) Inject as directed every 30 (thirty) days.      . Esomeprazole Magnesium (NEXIUM PO) Take by mouth as needed.      Marland Kitchen LORazepam (ATIVAN) 0.5 MG tablet Take 0.5 mg by mouth every 8 (eight) hours as needed for anxiety.      . metoprolol succinate (TOPROL-XL) 25 MG 24 hr tablet Take 25 mg by mouth daily.      . pravastatin (PRAVACHOL) 40 MG tablet Take 40 mg by mouth daily.      Marland Kitchen zolpidem (AMBIEN) 5 MG tablet Take 5 mg by mouth at bedtime as needed for sleep.       No current facility-administered medications for this visit.    OBJECTIVE: Middle-aged white woman in no acute distress Filed Vitals:   04/05/13 0845  BP: 147/80  Pulse: 76  Temp: 97.6 F (36.4 C)  Resp: 20     Body mass index is 26.95 kg/(m^2).    ECOG FS:0 - Asymptomatic  Sclerae unicteric Oropharynx clear No cervical or supraclavicular adenopathy Lungs no rales or rhonchi Heart regular rate and rhythm Abd obese, benign MSK kyphosis but no focal spinal tenderness, no peripheral edema Neuro: non-focal, well-oriented, appropriate affect Breasts: The right breast does not show a palpable mass, or any skin change or nipple retraction of concern. The right axilla is benign.   LAB RESULTS:  CMP     Component Value Date/Time   NA 141 04/05/2013 0803   K 4.1 04/05/2013 0803   CO2 23 04/05/2013 0803   GLUCOSE 115 04/05/2013 0803   BUN 18.8 04/05/2013 0803   CREATININE 1.0 04/05/2013 0803   CALCIUM 9.5 04/05/2013 0803   PROT 7.4 04/05/2013 0803   ALBUMIN 3.9 04/05/2013 0803   AST 21  04/05/2013 0803   ALT 25 04/05/2013 0803   ALKPHOS 67 04/05/2013 0803   BILITOT 0.50 04/05/2013 0803    I No results found for this basename: SPEP, UPEP,  kappa and lambda light chains    Lab Results  Component Value Date   WBC 4.9 04/05/2013   NEUTROABS 2.4 04/05/2013   HGB 15.5 04/05/2013   HCT 46.1 04/05/2013   MCV 91.1 04/05/2013   PLT 181 04/05/2013      Chemistry      Component Value Date/Time   NA 141 04/05/2013 0803   K 4.1 04/05/2013 0803   CO2 23 04/05/2013 0803   BUN 18.8 04/05/2013 0803   CREATININE  1.0 04/05/2013 0803      Component Value Date/Time   CALCIUM 9.5 04/05/2013 0803   ALKPHOS 67 04/05/2013 0803   AST 21 04/05/2013 0803   ALT 25 04/05/2013 0803   BILITOT 0.50 04/05/2013 0803       No results found for this basename: LABCA2    No components found with this basename: LABCA125    No results found for this basename: INR,  in the last 168 hours  Urinalysis No results found for this basename: colorurine, appearanceur, labspec, phurine, glucoseu, hgbur, bilirubinur, ketonesur, proteinur, urobilinogen, nitrite, leukocytesur    STUDIES: No results found.  ASSESSMENT: 67 y.o. [place] woman status post right breast upper outer quadrant biopsy 03/24/2013 for a pT1b cN0, stage IA invasive ductal carcinoma, grade 1, strongly estrogen and progesterone receptor positive, HER-2 nonamplified  PLAN: We spent the better part of today's hour-long visit discussing the biology of breast cancer in general and the specifics of the patient's tumor in particular. Suriah understands that there is no survival benefit to mastectomy as compared to lumpectomy. Nevertheless, she is very motivated to undergo bilateral mastectomies because she does not want to deal with future breast cancers and she does not want the anxiety and attention of future mammographies, and other radiologic studies. She understands she will benefit from genetic counseling and this is being  operationalized.  Depending on the size of her tumor, she may benefit from antiestrogens sufficiently to warrant her receiving them. She is unlikely to benefit from chemotherapy, and even though her tumor is 6 mm on the original core, I am not sure we will need an Oncotype to make the chemotherapy decision, although that is what would be suggested by a mechanical application of NCCN guidelines. We will discuss that further once we have the final pathology results.  Estephani has a very good understanding of the fact that she has an excellent prognosis and that this tumor is very unlikely to take her life. She will return to see me late October, by which time she will have completed her radiation treatments. She knows to call for any problems that may develop before next visit here.  Lowella Dell, MD   04/05/2013 1:31 PM

## 2013-04-05 NOTE — Progress Notes (Signed)
[facility] Psychosocial Distress Screening Clinical Social Work  Patient completed distress screening protocol, and scored a 8 on the Psychosocial Distress Thermometer which indicates high distress. Clinical Social Worker met with pt in [facility] to assess for distress and other psychosocial needs.  Pt expressed feeling overwhelmed, but felt "better" having more information and a treatment plan.  CSW provided pt with information on the support team and resources at [facility].  Pt was open to support services and agreeable an alight guide referral.  CSW provided pt with a patient and family support calendar and encouraged her to call with any questions or concerns.    Tamala Julian, MSW, LCSW Clinical Social Worker [facility] [phone]

## 2013-04-05 NOTE — Patient Instructions (Addendum)
Will schedule your surgery after he had seen a Engineer, petroleum.

## 2013-04-07 ENCOUNTER — Other Ambulatory Visit: Payer: Self-pay | Admitting: Physician Assistant

## 2013-04-07 ENCOUNTER — Telehealth (INDEPENDENT_AMBULATORY_CARE_PROVIDER_SITE_OTHER): Payer: Self-pay | Admitting: *Deleted

## 2013-04-07 DIAGNOSIS — C50919 Malignant neoplasm of unspecified site of unspecified female breast: Secondary | ICD-10-CM | POA: Diagnosis not present

## 2013-04-07 NOTE — Telephone Encounter (Signed)
Patient's husband called to state that they meet with Dr. Maxcine Ham this morning to discuss the reconstruction.  Husband wants to know if patient would be a candidate for Nipple Sparing Mastectomy.  They are hoping to schedule the surgery as soon after September 16th as possible.

## 2013-04-07 NOTE — Telephone Encounter (Signed)
Pt would like surgery scheduled after 05/02/13.

## 2013-04-07 NOTE — Telephone Encounter (Signed)
Noted  

## 2013-04-07 NOTE — Telephone Encounter (Signed)
LMOV pt to call to discuss her questions.  Please ask for Tracey [name].

## 2013-04-07 NOTE — Telephone Encounter (Signed)
I spoke with the Tracey [name] and her husband.  I explained that Dr. Abbey Chatters does not perform nipple sparing surgery.  I offered to refer the Tracey [name] to Dr. Carolynne Edouard for another consult.  They both expressed the desire to remain with Dr. Abbey Chatters.  They will wait to hear from our surgery schedulers.

## 2013-04-10 ENCOUNTER — Other Ambulatory Visit: Payer: Medicare Other | Admitting: Lab

## 2013-04-10 ENCOUNTER — Ambulatory Visit (HOSPITAL_BASED_OUTPATIENT_CLINIC_OR_DEPARTMENT_OTHER): Payer: Medicare Other | Admitting: Genetic Counselor

## 2013-04-10 ENCOUNTER — Encounter: Payer: Self-pay | Admitting: Genetic Counselor

## 2013-04-10 DIAGNOSIS — C50419 Malignant neoplasm of upper-outer quadrant of unspecified female breast: Secondary | ICD-10-CM | POA: Diagnosis not present

## 2013-04-10 DIAGNOSIS — IMO0002 Reserved for concepts with insufficient information to code with codable children: Secondary | ICD-10-CM | POA: Diagnosis not present

## 2013-04-10 DIAGNOSIS — C50111 Malignant neoplasm of central portion of right female breast: Secondary | ICD-10-CM

## 2013-04-10 NOTE — Progress Notes (Signed)
Dr.  Raymond Gurney Magrinat requested a consultation for genetic counseling and risk assessment for Tracey [name], a 67 y.o. female, for discussion of her personal and fmaily history of breast cancer.  She presents to clinic today to discuss the possibility of a genetic predisposition to cancer, and to further clarify her risks, as well as her family members' risks for cancer.   HISTORY OF PRESENT ILLNESS: In August 2014, at the age of 79, Tracey [name] was diagnosed with invasive ductal carcinoma of the right breast. This will be treated with bilateral mastectomy in September and possible radiation.  The tumor is ER+/PR+/Her2-.  The patient has had a colonoscopy in the past and was found to have 2 polyps.  Her daughter has been tested for BRCA mutations and was negative.    Past Medical History  Diagnosis Date  . Arthritis   . Cataract   . Gout   . Tachycardia   . Hyperlipidemia   . Gout   . Anxiety   . Breast cancer 2014    ER+/PR+/HEr2-,   . Colon polyps     2 polyps by report    Past Surgical History  Procedure Laterality Date  . Appendectomy    . Gallbladder surgery    . Abdominal hysterectomy    . Cholecystectomy    . Cosmetic surgery      History   Social History  . Marital Status: Married    Spouse Name: N/A    Number of Children: 2  . Years of Education: N/A   Social History Main Topics  . Smoking status: Former Smoker    Start date: 06/09/1992  . Smokeless tobacco: None  . Alcohol Use: 3.0 oz/week    5 Glasses of wine per week  . Drug Use: No  . Sexual Activity: Yes    Birth Control/ Protection: None   Other Topics Concern  . None   Social History Narrative  . None    REPRODUCTIVE HISTORY AND PERSONAL RISK ASSESSMENT FACTORS: Menarche was at age 56.   postmenopausal Uterus Intact: no, the patient had a complete hysterectomy in her 45s because of severe endometriosis. Ovaries Intact: no G2P2A0, first live birth at age 19  She has not previously  undergone treatment for infertility.   Oral Contraceptive use: 0 years   She has not used HRT in the past.    FAMILY HISTORY:  We obtained a detailed, 4-generation family history.  Significant diagnoses are listed below: Family History  Problem Relation Age of Onset  . Breast cancer Mother     possible inflammatory breast cancer  . Heart attack Father   . Heart disease Father   . ALS Sister   . Breast cancer Maternal Grandmother 68  . Heart attack Maternal Grandfather   . Heart attack Paternal Grandfather   . Breast cancer Maternal Aunt     dx in her 77s  . Breast cancer Other     maternal great grandmother; dx in her 62s   Patient's maternal ancestors are of [place] and [place] descent, and paternal ancestors are of [place] descent. There is no reported Ashkenazi Jewish ancestry. There is no known consanguinity.  GENETIC COUNSELING ASSESSMENT: Tracey [name] is a 67 y.o. female with a personal history of breast cancer and a family history of breast cancer which somewhat suggestive of a hereditary breast cancer syndrome and predisposition to cancer. We, therefore, discussed and recommended the following at today's visit.   DISCUSSION: We reviewed  the characteristics, features and inheritance patterns of hereditary cancer syndromes. We also discussed genetic testing, including the appropriate family members to test, the process of testing, insurance coverage and turn-around-time for results.   In order to estimate her chance of having a BRCA mutation, we used statistical models (Penn II) and laboratory data that take into account her personal medical history, family history and ancestry.  Because each model is different, there can be a lot of variability in the risks they give.  Therefore, these numbers must be considered a rough range and not a precise risk of having a BRCA mutation.  These models estimate that she has approximately a 9% chance of having a mutation. Based on this  assessment of her family and personal history, genetic testing is recommended.  PLAN: After considering the risks, benefits, and limitations, Tracey [name] provided informed consent to pursue genetic testing and the blood sample will be sent to [facility] for analysis of the Breast/Ovarian cancer panel. We discussed the implications of a positive, negative and/ or variant of uncertain significance genetic test result. Results should be available within approximately 3-4 weeks' time, at which point they will be disclosed by telephone to Tracey [name], as will any additional recommendations warranted by these results. Tracey [name] will receive a summary of her genetic counseling visit and a copy of her results once available. This information will also be available in Epic. We encouraged Tracey [name] to remain in contact with cancer genetics annually so that we can continuously update the family history and inform her of any changes in cancer genetics and testing that may be of benefit for her family. Tracey [name]'s questions were answered to her satisfaction today. Our contact information was provided should additional questions or concerns arise.  The patient was seen for a total of 60 minutes, greater than 50% of which was spent face-to-face counseling.  This plan is being carried out per Dr. Feliz Beam recommendations.  This note will also be sent to the referring provider via the electronic medical record. The patient will be supplied with a summary of this genetic counseling discussion as well as educational information on the discussed hereditary cancer syndromes following the conclusion of their visit.   Patient was discussed with Dr. Drue Second.   _______________________________________________________________________ For Office Staff:  Number of people involved in session: 2 Was an Intern/ student involved with case: yes

## 2013-04-11 DIAGNOSIS — C50919 Malignant neoplasm of unspecified site of unspecified female breast: Secondary | ICD-10-CM | POA: Diagnosis not present

## 2013-04-13 ENCOUNTER — Other Ambulatory Visit: Payer: Self-pay | Admitting: *Deleted

## 2013-04-13 DIAGNOSIS — C50911 Malignant neoplasm of unspecified site of right female breast: Secondary | ICD-10-CM

## 2013-04-13 MED ORDER — VENLAFAXINE HCL ER 37.5 MG PO CP24: 37.5000 mg | ORAL_CAPSULE | Freq: Every day | ORAL | Status: DC

## 2013-04-13 MED ORDER — LORAZEPAM 0.5 MG PO TABS: ORAL_TABLET | ORAL | Status: DC

## 2013-04-13 NOTE — Telephone Encounter (Signed)
Received call from daughter [name] who is concerned since her mother has stopped her estrogen, she has been having a lot of hot flashes and also anxiety about her diagnosis, surgery, etc.  She was wandering if she could take some type of estrogen.  I informed her that she should not but I could talk with Dr. Darnelle Catalan about starting some Effexor XR and maybe taking some Lorazepam.  Per Dr. Darnelle Catalan this is ok but to schedule a follow up with Amy Berry,PA to see how Effexor is working for her.  Patient's daughter does state she has some Lorazepam at home but her mother is not taking it.  Informed she could try it at night to see if this will relax her and let her get some sleep.  Informed her to call if she needed anything.

## 2013-04-25 ENCOUNTER — Encounter: Payer: Self-pay | Admitting: Physician Assistant

## 2013-04-25 ENCOUNTER — Telehealth: Payer: Self-pay | Admitting: Oncology

## 2013-04-25 ENCOUNTER — Ambulatory Visit (HOSPITAL_BASED_OUTPATIENT_CLINIC_OR_DEPARTMENT_OTHER): Payer: Medicare Other | Admitting: Physician Assistant

## 2013-04-25 ENCOUNTER — Telehealth: Payer: Self-pay | Admitting: Genetic Counselor

## 2013-04-25 ENCOUNTER — Encounter: Payer: Self-pay | Admitting: Genetic Counselor

## 2013-04-25 VITALS — BP 157/74 | HR 63 | Temp 98.1°F | Resp 20 | Ht 63.0 in | Wt 148.5 lb

## 2013-04-25 DIAGNOSIS — Z170 Estrogen receptor positive status [ER+]: Secondary | ICD-10-CM

## 2013-04-25 DIAGNOSIS — C50419 Malignant neoplasm of upper-outer quadrant of unspecified female breast: Secondary | ICD-10-CM

## 2013-04-25 DIAGNOSIS — C50111 Malignant neoplasm of central portion of right female breast: Secondary | ICD-10-CM

## 2013-04-25 MED ORDER — ANASTROZOLE 1 MG PO TABS: 1.0000 mg | ORAL_TABLET | Freq: Every day | ORAL | Status: DC

## 2013-04-25 NOTE — Progress Notes (Signed)
ID: [name] OB: 10/23/1945  MR#: [id number]  JYN#:[id number]  PCP: No PCP Per Patient GYN:   SU: Avel Peace OTHER MD: Lurline Hare, Larwance Sachs (FAX [phone])   HISTORY OF PRESENT ILLNESS: [name] had routine screening mammography July of 2014 showing a suspicious mass in her right breast. Additional views confirmed a mass in the upper outer quadrant of the right breast, which by ultrasound measured 4 mm. Biopsy of this mass [place] at [facility]. It showed (accession number [id number]) an invasive ductal carcinoma measuring 6 mm on the biopsy, grade 1, strongly estrogen and progesterone receptor positive, HER-2 nonamplified.  The patient's subsequent history is as detailed below  INTERVAL HISTORY: Tracey [name] returns alone today for followup of her right breast carcinoma. She was seen here during our clinic on 04/05/2013. The original plan was to proceed with surgery, but that has not yet been done, and she is not scheduled for bilateral mastectomies until 05/23/2013.   Overall, Tracey [name] is feeling well. She was recently treated for sinusitis with a Z-Pak, now resolved. She's getting ready to leave tomorrow to go to [place] for 5 days with her grandchildren which she is looking forward to.  REVIEW OF SYSTEMS: Tracey [name] has had no fevers or chills. She continues to have hot flashes, and was recently started on venlafaxine, 37.5 mg daily. She's been taking the medication now for approximately one week with good tolerance. She's eating and drinking well denies any nausea, emesis, or change in bowel or bladder habits. She's had no cough, shortness of breath, or chest pain. No abnormal headaches, change in vision, or dizziness. She has some arthritic joint pain which is chronic, but she has no new or unusual myalgias, arthralgias, or bony pain.  A detailed review of systems is otherwise noncontributory.   PAST MEDICAL HISTORY: Past Medical History  Diagnosis  Date  . Arthritis   . Cataract   . Gout   . Tachycardia   . Hyperlipidemia   . Gout   . Anxiety   . Breast cancer 2014    ER+/PR+/HEr2-,   . Colon polyps     2 polyps by report    PAST SURGICAL HISTORY: Past Surgical History  Procedure Laterality Date  . Appendectomy    . Gallbladder surgery    . Abdominal hysterectomy    . Cholecystectomy    . Cosmetic surgery      FAMILY HISTORY Family History  Problem Relation Age of Onset  . Breast cancer Mother     possible inflammatory breast cancer  . Heart attack Father   . Heart disease Father   . ALS Sister   . Breast cancer Maternal Grandmother 30  . Heart attack Maternal Grandfather   . Heart attack Paternal Grandfather   . Breast cancer Maternal Aunt     dx in her 47s  . Breast cancer Other     maternal great grandmother; dx in her 45s   the patient's father died from heart disease at the age of 31. The patient's mother died from breast cancer at the age of 34. It is not clear when she was diagnosed since she "dictated and". The patient had no brothers. One sister died from ALS at the age of 60 in addition, one of the patient's mother is sisters was diagnosed with breast cancer in her 45s. The patient's mother is mother, was also diagnosed with breast cancer, at the age of 52. The patient's daughter, Tracey [name], has  been tested for the BRCA genes and was negative.  GYNECOLOGIC HISTORY:  Menarche age 47, first live birth age 6, the patient is GX P2. She underwent total abdominal hysterectomy and bilateral salpingo-oophorectomy at the age of 28 for endometriosis. She took hormone replacement until August of 2014.  SOCIAL HISTORY:  Tracey [name] recently moved to [facility] and is planning to make this area her "home-based", although they also have a home in [place] where to go for the winter to ski. 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 They are now enjoying her retirement.  Daughter Tracey [name] lives in [place] where she is a younger in structure daughter Tracey [name] is a homemaker in [place]. The patient has 3 grandchildren. She is a Investment banker, operational.    ADVANCED DIRECTIVES: In place   HEALTH MAINTENANCE: History  Substance Use Topics  . Smoking status: Former Smoker    Start date: 06/09/1992  . Smokeless tobacco: Never Used  . Alcohol Use: 3.0 oz/week    5 Glasses of wine per week     Colonoscopy: 2010  PAP: August 2014  Bone density: 2010, normal per patient  Lipid panel:  Allergies  Allergen Reactions  . Amoxicillin Nausea And Vomiting  . Ivp Dye [Iodinated Diagnostic Agents] Hives  . Sulfa Antibiotics Swelling    Current Outpatient Prescriptions  Medication Sig Dispense Refill  . allopurinol (ZYLOPRIM) 100 MG tablet Take 100 mg by mouth daily.      . celecoxib (CELEBREX) 200 MG capsule Take 200 mg by mouth daily.      . fluticasone (FLONASE) 50 MCG/ACT nasal spray       . metoprolol succinate (TOPROL-XL) 25 MG 24 hr tablet Take 25 mg by mouth daily.      . pravastatin (PRAVACHOL) 40 MG tablet Take 40 mg by mouth daily.      Marland Kitchen venlafaxine XR (EFFEXOR-XR) 37.5 MG 24 hr capsule Take 1 capsule (37.5 mg total) by mouth daily.  30 capsule  4  . VOLTAREN 1 % GEL       . anastrozole (ARIMIDEX) 1 MG tablet Take 1 tablet (1 mg total) by mouth daily.  30 tablet  5  . colchicine (COLCRYS) 0.6 MG tablet Take 0.6 mg by mouth as needed.      . Cyanocobalamin (VITAMIN B-12 IJ) Inject as directed every 30 (thirty) days.      . furosemide (LASIX) 40 MG tablet       . LORazepam (ATIVAN) 0.5 MG tablet Take 1 tablet by mouth at night as needed.  20 tablet  1  . zolpidem (AMBIEN) 5 MG tablet Take 5 mg by mouth at bedtime as needed for sleep.       No current facility-administered medications for this visit.    OBJECTIVE: Middle-aged white woman in no acute distress Filed Vitals:   04/25/13 1610  BP: 157/74  Pulse: 63  Temp: 98.1 F (36.7  C)  Resp: 20     Body mass index is 26.31 kg/(m^2).    ECOG FS:0 - Asymptomatic Filed Weights   04/25/13 1610  Weight: 148 lb 8 oz (67.359 kg)   Sclerae unicteric Oropharynx clear No cervical or supraclavicular adenopathy Lungs clear to auscultation bilaterally, no wheezes, no rales or rhonchi Heart regular rate and rhythm Abd soft, nontender to palpation, positive bowel sounds MSK mild kyphosis but no focal spinal tenderness, no peripheral edema Neuro: non-focal, well-oriented, appropriate affect Breasts:  No palpable mass noted in the right breast. No skin changes, nipple inversion, or nipple discharge. Left breast is unremarkable. Axillae are benign bilaterally, no palpable adenopathy noted.   LAB RESULTS:   Lab Results  Component Value Date   WBC 4.9 04/05/2013   NEUTROABS 2.4 04/05/2013   HGB 15.5 04/05/2013   HCT 46.1 04/05/2013   MCV 91.1 04/05/2013   PLT 181 04/05/2013      Chemistry      Component Value Date/Time   NA 141 04/05/2013 0803   K 4.1 04/05/2013 0803   CO2 23 04/05/2013 0803   BUN 18.8 04/05/2013 0803   CREATININE 1.0 04/05/2013 0803      Component Value Date/Time   CALCIUM 9.5 04/05/2013 0803   ALKPHOS 67 04/05/2013 0803   AST 21 04/05/2013 0803   ALT 25 04/05/2013 0803   BILITOT 0.50 04/05/2013 0803       STUDIES: No results found.    ASSESSMENT: 67 y.o. BRCA 1 & 2 negative [place] woman   (1)  status post right breast upper outer quadrant biopsy 03/24/2013 for a pT1b cN0, stage IA invasive ductal carcinoma, grade 1, strongly estrogen and progesterone receptor positive, HER-2 negative  (2)  pending bilateral mastectomies on 05/23/2013  (3)  Being started on anastrozole, mid September 2014, prior to definitive surgery.    PLAN: Over half of our 40 minute appointment today was spent reviewing Tracey [name]'s diagnosis, discussing treatment options along with her upcoming surgery, and coordinating care.  Since it will be will be approximately 3 months  between her diagnosis in July and her surgery in October, Dr. Darnelle Catalan feels it would be helpful to go ahead and start Tracey [name] on her antiestrogen therapy, specifically anastrozole. Hopefully this will prevent any further growth of the mass in the right breast pending surgery. She can continue the anastrozole right through surgery, and our plan will be to treat for total of 5 years.  We reviewed all possible side effects and toxicities associated with the anastrozole and she was given all this information in writing today. Of course she is already having hot flashes and we may need to eventually increase her venlafaxine to 75 mg daily.  She will start the anastrozole in 5 days when she returns from her trip to [place]. We'll plan on seeing her back in late October, approximately 2-3 weeks after her bilateral mastectomies which are scheduled for 05/23/2013. At that time we will assess her tolerance of the anastrozole and discuss long-term followup. I will try to obtain a baseline bone density prior to that appointment. She voices understanding and agreement with this plan, and will call any changes or problems.    Demitria Hay, PA-C   04/25/2013 4:11 PM

## 2013-04-25 NOTE — Telephone Encounter (Signed)
, °

## 2013-04-25 NOTE — Telephone Encounter (Signed)
Revealed negative genetic test results 

## 2013-05-12 ENCOUNTER — Encounter (HOSPITAL_COMMUNITY): Payer: Self-pay | Admitting: Pharmacy Technician

## 2013-05-16 ENCOUNTER — Encounter (INDEPENDENT_AMBULATORY_CARE_PROVIDER_SITE_OTHER): Payer: Self-pay

## 2013-05-16 ENCOUNTER — Encounter (HOSPITAL_COMMUNITY)
Admission: RE | Admit: 2013-05-16 | Discharge: 2013-05-16 | Disposition: A | Discharge status: Home / Self Care | Payer: Medicare Other | Source: Ambulatory Visit | Attending: General Surgery | Admitting: General Surgery

## 2013-05-16 ENCOUNTER — Encounter (HOSPITAL_COMMUNITY): Payer: Self-pay

## 2013-05-16 DIAGNOSIS — Z01812 Encounter for preprocedural laboratory examination: Secondary | ICD-10-CM | POA: Insufficient documentation

## 2013-05-16 DIAGNOSIS — Z01810 Encounter for preprocedural cardiovascular examination: Secondary | ICD-10-CM | POA: Diagnosis not present

## 2013-05-16 DIAGNOSIS — Z01818 Encounter for other preprocedural examination: Secondary | ICD-10-CM | POA: Diagnosis not present

## 2013-05-16 DIAGNOSIS — J40 Bronchitis, not specified as acute or chronic: Secondary | ICD-10-CM | POA: Diagnosis not present

## 2013-05-16 HISTORY — DX: Personal history of other diseases of the respiratory system: Z87.09

## 2013-05-16 HISTORY — DX: Unspecified osteoarthritis, unspecified site: M19.90

## 2013-05-16 HISTORY — DX: Other skin changes: R23.8

## 2013-05-16 HISTORY — DX: Insomnia, unspecified: G47.00

## 2013-05-16 HISTORY — DX: Gastro-esophageal reflux disease without esophagitis: K21.9

## 2013-05-16 HISTORY — DX: Chronic rhinitis: J31.0

## 2013-05-16 HISTORY — DX: Localized edema: R60.0

## 2013-05-16 HISTORY — DX: Diverticulosis of intestine, part unspecified, without perforation or abscess without bleeding: K57.90

## 2013-05-16 HISTORY — DX: Spontaneous ecchymoses: R23.3

## 2013-05-16 HISTORY — DX: Nausea with vomiting, unspecified: R11.2

## 2013-05-16 HISTORY — DX: Edema, unspecified: R60.9

## 2013-05-16 HISTORY — DX: Personal history of urinary (tract) infections: Z87.440

## 2013-05-16 HISTORY — DX: Other specified postprocedural states: Z98.890

## 2013-05-16 LAB — COMPREHENSIVE METABOLIC PANEL
ALT: 22 U/L (ref 0–35)
AST: 23 U/L (ref 0–37)
Albumin: 4.3 g/dL (ref 3.5–5.2)
Alkaline Phosphatase: 71 U/L (ref 39–117)
BUN: 21 mg/dL (ref 6–23)
Chloride: 101 mEq/L (ref 96–112)
GFR calc non Af Amer: 55 mL/min — ABNORMAL LOW (ref >90)
Potassium: 4.3 mEq/L (ref 3.5–5.1)
Sodium: 137 mEq/L (ref 135–145)
Total Bilirubin: 0.4 mg/dL (ref 0.3–1.2)

## 2013-05-16 LAB — TYPE AND SCREEN: Antibody Screen: NEGATIVE

## 2013-05-16 LAB — CBC WITH DIFFERENTIAL/PLATELET
Basophils Absolute: 0 10*3/uL (ref 0.0–0.1)
Basophils Relative: 0 % (ref 0–1)
Hemoglobin: 15.4 g/dL — ABNORMAL HIGH (ref 12.0–15.0)
MCHC: 35.2 g/dL (ref 30.0–36.0)
MCV: 89 fL (ref 78.0–100.0)
Monocytes Relative: 8 % (ref 3–12)
Neutro Abs: 3 10*3/uL (ref 1.7–7.7)
Neutrophils Relative %: 50 % (ref 43–77)
Platelets: 174 10*3/uL (ref 150–400)
RDW: 14.2 % (ref 11.5–15.5)

## 2013-05-16 LAB — PROTIME-INR: Prothrombin Time: 11.9 seconds (ref 11.6–15.2)

## 2013-05-16 LAB — ABO/RH: ABO/RH(D): B POS

## 2013-05-16 IMAGING — CR DG CHEST 2V
2 series · 2 of 2 positions shown · non-contrast
Comparison: [DATE]

CLINICAL DATA: Right breast cancer. Pre operative respiratory exam.

EXAM:
CHEST  2 VIEW

[w chest pa]
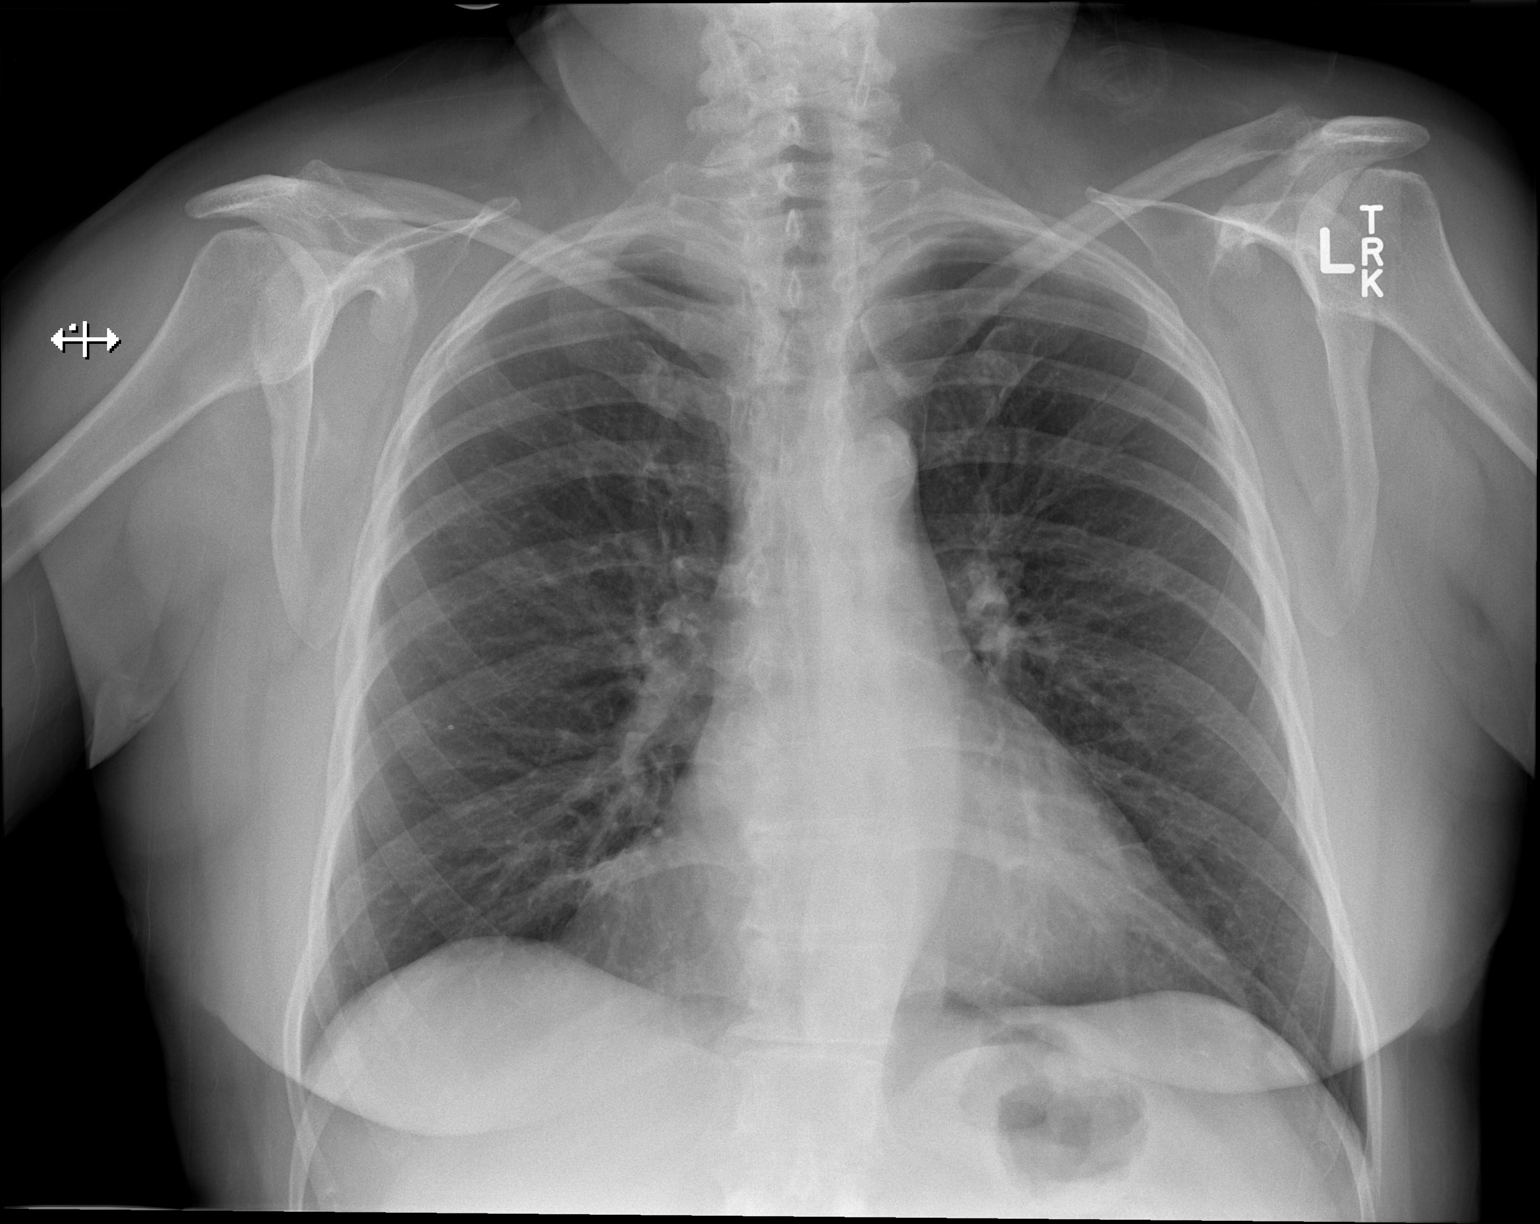

[w chest lat]
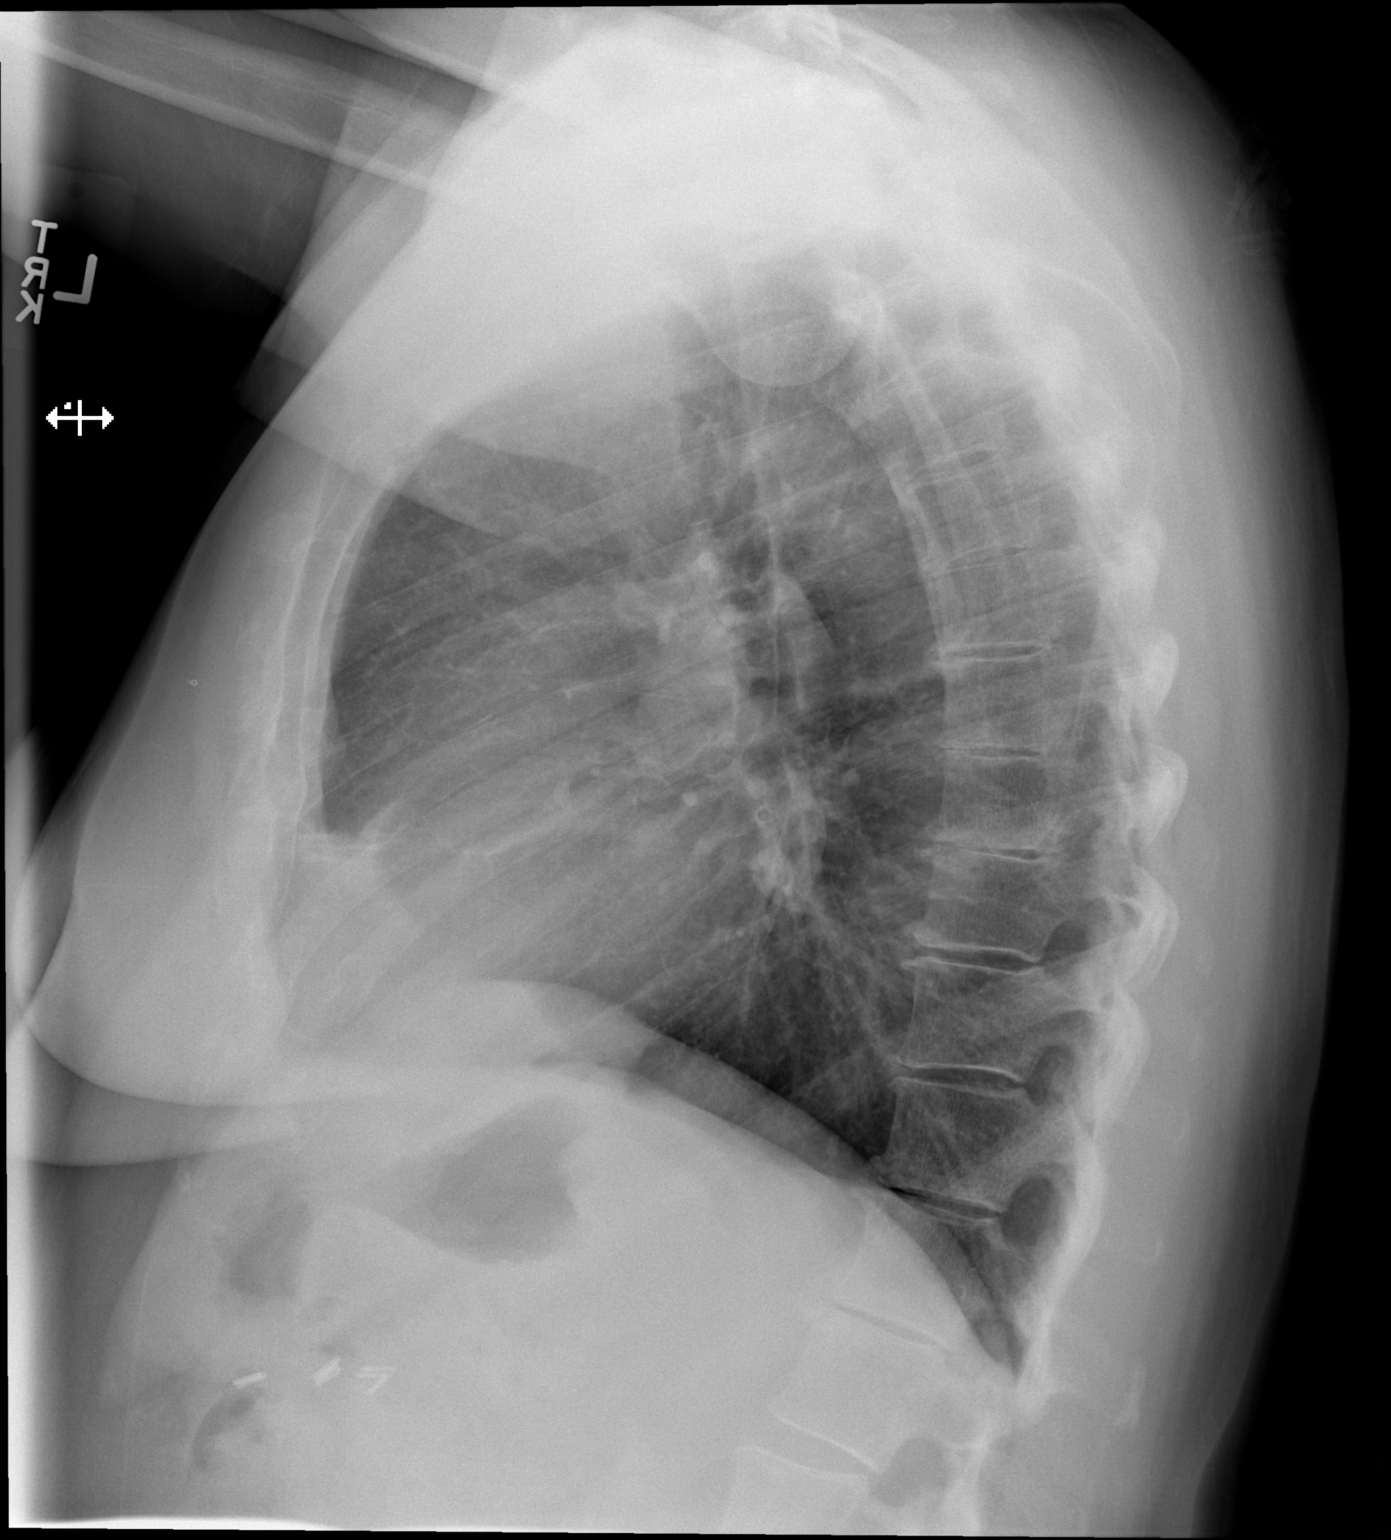

[2 of 2 positions shown; findings below may reference images not displayed]

FINDINGS: Heart size and pulmonary vascularity are normal the lungs are clear
except for slight peribronchial thickening which could be seen with
acute or chronic bronchitis. No infiltrates or effusions. No acute
osseous abnormality.
IMPRESSION: Slight bronchitic changes which could be acute or chronic.

## 2013-05-16 NOTE — Progress Notes (Signed)
Pt doesn't have a cardiologist  Heart cath in epic from 1999  Echo and stress test done around 2000  Sleep study report in epic from 2009   Denies ekg or cxr in past yr-really not sure how longs its been

## 2013-05-16 NOTE — Pre-Procedure Instructions (Signed)
Tracey [name]  05/16/2013   Your procedure is scheduled on:  Tues, Oct 7 @ 7:30 AM  Report to [facility]  2 * 3 at 5:30 AM.  Call this number if you have problems the morning of surgery: [phone]   Remember:   Do not eat food or drink liquids after midnight.   Take these medicines the morning of surgery with A SIP OF WATER: Allopurinol(Zyloprim),Arimidex(Anastrozole),Colchicine(Colcrys),Fluticasone(Flonae-if needed),Claritin(if needed),Ativan(Lorazepam),Metoprolol(Toprol),and Effexor(Venlafaxine)               Stop taking your Celebrex.No Goody's,BC's,Aleve,Ibuprofen,Aspirin,Fish Oil,or any Herbal Medications   Do not wear jewelry, make-up or nail polish.  Do not wear lotions, powders, or perfumes. You may wear deodorant.  Do not shave 48 hours prior to surgery.   Do not bring valuables to the hospital.  [facility] is not responsible                  for any belongings or valuables.               Contacts, dentures or bridgework may not be worn into surgery.  Leave suitcase in the car. After surgery it may be brought to your room.  For patients admitted to the hospital, discharge time is determined by your                treatment team.              Special Instructions: Shower using CHG 2 nights before surgery and the night before surgery.  If you shower the day of surgery use CHG.  Use special wash - you have one bottle of CHG for all showers.  You should use approximately 1/3 of the bottle for each shower.   Please read over the following fact sheets that you were given: Pain Booklet, Coughing and Deep Breathing, Blood Transfusion Information and Surgical Site Infection Prevention

## 2013-05-17 ENCOUNTER — Other Ambulatory Visit: Payer: Self-pay | Admitting: Plastic Surgery

## 2013-05-17 DIAGNOSIS — C50919 Malignant neoplasm of unspecified site of unspecified female breast: Secondary | ICD-10-CM | POA: Diagnosis not present

## 2013-05-17 NOTE — Op Note (Signed)
NAME:  [name], Tracey                 ACCOUNT NO.:  [id number]  MEDICAL RECORD NO.:  [id number]  LOCATION:                               FACILITY:  [facility]  PHYSICIAN:  Amantha Sklar G. Dorla Guizar, M.D.DATE OF BIRTH:  06/11/1959  DATE OF PROCEDURE:  05/16/2013 DATE OF DISCHARGE:  05/16/2013                              OPERATIVE REPORT   PREOPERATIVE DIAGNOSIS:  Left shoulder adhesive capsulitis.  POSTOPERATIVE DIAGNOSIS:  Left shoulder adhesive capsulitis.  PROCEDURES: 1. Left shoulder arthroscopic debridement. 2. Left shoulder manipulation.  ANESTHESIA:  General.  ATTENDING SURGEON:  Katryna Tschirhart G. Earsel Shouse, M.D.  ASSISTANT:  Michael Carnaghi, P.A.  INDICATION FOR PROCEDURE:  The patient is a 67-year-old man who had 2 arthroscopies of the shoulder about 2 years ago.  He did well, but over the last 6 months he has experienced recurrent stiffness and terrible pain.  This has persisted despite various injections and therapies.  He is offered a repeat manipulation and debridement.  Informed operative consent was obtained after discussion of possible complications including reaction to anesthesia and infection.  SUMMARY OF FINDINGS AND PROCEDURE:  Under general anesthesia through his old portals, a left shoulder arthroscopy was performed.  Glenohumeral joint showed no degenerative change and the biceps tendon looked normal. Had complete scar tissue obliteration of the interval and this was addressed with resection of the scar.  The subscapularis tendon was freed completely and we did improve his external rotation.  In the subacromial space, he had some adhesions between the cuff and the acromion and resection was done here as well, but no tear worthy of repair was found.  He was manipulated and closed and discharged home.  DESCRIPTION OF PROCEDURE:  The patient was taken to the operating suite where general anesthetic was applied without difficulty.  He was positioned in a beach-chair position  and prepped and draped in normal sterile fashion.  After the administration of IV vancomycin and appropriate time-out, an arthroscopy of the left shoulder was performed through total of 3 old portals.  Findings were as noted above and procedure consisted of resection of scar tissue, and release of adhesions followed by manipulation.  We did improve his forward flexion to full and external rotation from neutral to about 30 degrees at least. The shoulder was irrigated followed by reapproximation of portals loosely with nylon.  Adaptic was applied followed by dry gauze and tape.  Estimated blood loss and intraoperative fluids can be obtained from anesthesia records.  DISPOSITION:  The patient was extubated in the operating room and taken to recovery room in stable condition.  Plans were for him to go home same-day and follow up in the office in one week.  I will contact him by phone tonight.     Amberrose Friebel G. Robyn Galati, M.D.     PGD/MEDQ  D:  05/16/2013  T:  05/17/2013  Job:  [id number] 

## 2013-05-22 MED ORDER — CEFAZOLIN SODIUM-DEXTROSE 2-3 GM-% IV SOLR
2.0000 g | INTRAVENOUS | Status: AC
  Administered 2013-05-23: 2 g via INTRAVENOUS
  Filled 2013-05-22: qty 50

## 2013-05-23 ENCOUNTER — Encounter (HOSPITAL_COMMUNITY): Payer: Self-pay | Admitting: *Deleted

## 2013-05-23 ENCOUNTER — Inpatient Hospital Stay (HOSPITAL_COMMUNITY)
Admission: RE | Admit: 2013-05-23 | Discharge: 2013-05-26 | DRG: 581 | Disposition: A | Discharge status: Home / Self Care | Payer: Medicare Other | Source: Ambulatory Visit | Attending: General Surgery | Admitting: General Surgery

## 2013-05-23 ENCOUNTER — Encounter (HOSPITAL_COMMUNITY)
Admission: RE | Admit: 2013-05-23 | Discharge: 2013-05-23 | Disposition: A | Discharge status: Home / Self Care | Payer: Medicare Other | Source: Ambulatory Visit | Attending: General Surgery | Admitting: General Surgery

## 2013-05-23 ENCOUNTER — Encounter (HOSPITAL_COMMUNITY): Payer: Self-pay | Admitting: Critical Care Medicine

## 2013-05-23 ENCOUNTER — Encounter (HOSPITAL_COMMUNITY)
Admission: RE | Disposition: A | Discharge status: Home / Self Care | Payer: Self-pay | Source: Ambulatory Visit | Attending: General Surgery

## 2013-05-23 ENCOUNTER — Inpatient Hospital Stay (HOSPITAL_COMMUNITY): Payer: Medicare Other | Admitting: Critical Care Medicine

## 2013-05-23 DIAGNOSIS — N6019 Diffuse cystic mastopathy of unspecified breast: Secondary | ICD-10-CM | POA: Diagnosis not present

## 2013-05-23 DIAGNOSIS — C50119 Malignant neoplasm of central portion of unspecified female breast: Secondary | ICD-10-CM | POA: Diagnosis present

## 2013-05-23 DIAGNOSIS — Z882 Allergy status to sulfonamides status: Secondary | ICD-10-CM | POA: Diagnosis not present

## 2013-05-23 DIAGNOSIS — C50919 Malignant neoplasm of unspecified site of unspecified female breast: Secondary | ICD-10-CM

## 2013-05-23 DIAGNOSIS — M1990 Unspecified osteoarthritis, unspecified site: Secondary | ICD-10-CM | POA: Diagnosis not present

## 2013-05-23 DIAGNOSIS — Z853 Personal history of malignant neoplasm of breast: Secondary | ICD-10-CM

## 2013-05-23 DIAGNOSIS — M109 Gout, unspecified: Secondary | ICD-10-CM | POA: Diagnosis present

## 2013-05-23 DIAGNOSIS — F411 Generalized anxiety disorder: Secondary | ICD-10-CM | POA: Diagnosis present

## 2013-05-23 DIAGNOSIS — G8918 Other acute postprocedural pain: Secondary | ICD-10-CM | POA: Diagnosis not present

## 2013-05-23 DIAGNOSIS — Z87891 Personal history of nicotine dependence: Secondary | ICD-10-CM | POA: Diagnosis not present

## 2013-05-23 DIAGNOSIS — D126 Benign neoplasm of colon, unspecified: Secondary | ICD-10-CM | POA: Diagnosis not present

## 2013-05-23 DIAGNOSIS — E785 Hyperlipidemia, unspecified: Secondary | ICD-10-CM | POA: Diagnosis not present

## 2013-05-23 DIAGNOSIS — R609 Edema, unspecified: Secondary | ICD-10-CM | POA: Diagnosis present

## 2013-05-23 DIAGNOSIS — R000 Tachycardia, unspecified: Secondary | ICD-10-CM | POA: Diagnosis present

## 2013-05-23 DIAGNOSIS — K219 Gastro-esophageal reflux disease without esophagitis: Secondary | ICD-10-CM | POA: Diagnosis present

## 2013-05-23 DIAGNOSIS — Z881 Allergy status to other antibiotic agents status: Secondary | ICD-10-CM | POA: Diagnosis not present

## 2013-05-23 DIAGNOSIS — F329 Major depressive disorder, single episode, unspecified: Secondary | ICD-10-CM | POA: Diagnosis present

## 2013-05-23 DIAGNOSIS — F3289 Other specified depressive episodes: Secondary | ICD-10-CM | POA: Diagnosis present

## 2013-05-23 DIAGNOSIS — C50419 Malignant neoplasm of upper-outer quadrant of unspecified female breast: Principal | ICD-10-CM | POA: Diagnosis present

## 2013-05-23 DIAGNOSIS — Z170 Estrogen receptor positive status [ER+]: Secondary | ICD-10-CM

## 2013-05-23 DIAGNOSIS — C50911 Malignant neoplasm of unspecified site of right female breast: Secondary | ICD-10-CM

## 2013-05-23 HISTORY — PX: AXILLARY SENTINEL NODE BIOPSY: SHX5738

## 2013-05-23 HISTORY — PX: BREAST RECONSTRUCTION WITH PLACEMENT OF TISSUE EXPANDER AND FLEX HD (ACELLULAR HYDRATED DERMIS): SHX6295

## 2013-05-23 HISTORY — PX: TOTAL MASTECTOMY: SHX6129

## 2013-05-23 SURGERY — MASTECTOMY, SIMPLE
Anesthesia: General | Site: Chest | Laterality: Right | Wound class: Clean

## 2013-05-23 MED ORDER — ALLOPURINOL 100 MG PO TABS
100.0000 mg | ORAL_TABLET | Freq: Every day | ORAL | Status: DC
  Administered 2013-05-24 – 2013-05-26 (×3): 100 mg via ORAL
  Filled 2013-05-23 (×5): qty 1

## 2013-05-23 MED ORDER — SODIUM CHLORIDE 0.9 % IJ SOLN
INTRAMUSCULAR | Status: AC
  Filled 2013-05-23: qty 10

## 2013-05-23 MED ORDER — FLUTICASONE PROPIONATE 50 MCG/ACT NA SUSP: 1.0000 | Freq: Every day | NASAL | Status: DC | PRN

## 2013-05-23 MED ORDER — SUCCINYLCHOLINE CHLORIDE 20 MG/ML IJ SOLN
INTRAMUSCULAR | Status: DC | PRN
  Administered 2013-05-23: 100 mg via INTRAVENOUS

## 2013-05-23 MED ORDER — VENLAFAXINE HCL ER 37.5 MG PO CP24
37.5000 mg | ORAL_CAPSULE | Freq: Every day | ORAL | Status: DC
  Administered 2013-05-24 – 2013-05-26 (×3): 37.5 mg via ORAL
  Filled 2013-05-23 (×3): qty 1

## 2013-05-23 MED ORDER — DEXTROSE-NACL 5-0.45 % IV SOLN
INTRAVENOUS | Status: DC
  Administered 2013-05-23 – 2013-05-26 (×6): via INTRAVENOUS

## 2013-05-23 MED ORDER — NEOSTIGMINE METHYLSULFATE 1 MG/ML IJ SOLN
INTRAMUSCULAR | Status: DC | PRN
  Administered 2013-05-23: 4 mg via INTRAVENOUS

## 2013-05-23 MED ORDER — DIPHENHYDRAMINE HCL 12.5 MG/5ML PO ELIX: 12.5000 mg | ORAL_SOLUTION | Freq: Four times a day (QID) | ORAL | Status: DC | PRN

## 2013-05-23 MED ORDER — FENTANYL CITRATE 0.05 MG/ML IJ SOLN
INTRAMUSCULAR | Status: DC | PRN
  Administered 2013-05-23 (×8): 50 ug via INTRAVENOUS

## 2013-05-23 MED ORDER — 0.9 % SODIUM CHLORIDE (POUR BTL) OPTIME
TOPICAL | Status: DC | PRN
  Administered 2013-05-23: 4000 mL

## 2013-05-23 MED ORDER — HYDROMORPHONE 0.3 MG/ML IV SOLN
INTRAVENOUS | Status: DC
  Administered 2013-05-23: 14:00:00 via INTRAVENOUS
  Administered 2013-05-23: 2.1 mg via INTRAVENOUS
  Administered 2013-05-24: 0.799 mg via INTRAVENOUS
  Administered 2013-05-24: 1.2 mg via INTRAVENOUS
  Administered 2013-05-24: 1.07 mg via INTRAVENOUS
  Filled 2013-05-23: qty 25

## 2013-05-23 MED ORDER — INFLUENZA VAC SPLIT QUAD 0.5 ML IM SUSP
0.5000 mL | INTRAMUSCULAR | Status: AC
  Administered 2013-05-24: 0.5 mL via INTRAMUSCULAR
  Filled 2013-05-23: qty 0.5

## 2013-05-23 MED ORDER — HEPARIN SODIUM (PORCINE) 5000 UNIT/ML IJ SOLN
5000.0000 [IU] | Freq: Once | INTRAMUSCULAR | Status: AC
  Administered 2013-05-23: 5000 [IU] via SUBCUTANEOUS

## 2013-05-23 MED ORDER — DIPHENHYDRAMINE HCL 50 MG/ML IJ SOLN
12.5000 mg | Freq: Four times a day (QID) | INTRAMUSCULAR | Status: DC | PRN
  Administered 2013-05-23 – 2013-05-24 (×2): 12.5 mg via INTRAVENOUS
  Filled 2013-05-23: qty 1

## 2013-05-23 MED ORDER — HEPARIN SODIUM (PORCINE) 5000 UNIT/ML IJ SOLN
5000.0000 [IU] | Freq: Three times a day (TID) | INTRAMUSCULAR | Status: DC
  Administered 2013-05-24 – 2013-05-26 (×6): 5000 [IU] via SUBCUTANEOUS
  Filled 2013-05-23 (×9): qty 1

## 2013-05-23 MED ORDER — LIDOCAINE HCL (CARDIAC) 20 MG/ML IV SOLN
INTRAVENOUS | Status: DC | PRN
  Administered 2013-05-23: 70 mg via INTRAVENOUS

## 2013-05-23 MED ORDER — DEXAMETHASONE SODIUM PHOSPHATE 4 MG/ML IJ SOLN
INTRAMUSCULAR | Status: DC | PRN
  Administered 2013-05-23: 4 mg via INTRAVENOUS

## 2013-05-23 MED ORDER — ANASTROZOLE 1 MG PO TABS
1.0000 mg | ORAL_TABLET | Freq: Every day | ORAL | Status: DC
  Administered 2013-05-23 – 2013-05-26 (×4): 1 mg via ORAL
  Filled 2013-05-23 (×4): qty 1

## 2013-05-23 MED ORDER — HEPARIN SODIUM (PORCINE) 5000 UNIT/ML IJ SOLN
INTRAMUSCULAR | Status: AC
  Filled 2013-05-23: qty 1

## 2013-05-23 MED ORDER — LORAZEPAM 0.5 MG PO TABS
0.5000 mg | ORAL_TABLET | Freq: Every evening | ORAL | Status: DC | PRN
  Administered 2013-05-24: 0.5 mg via ORAL
  Filled 2013-05-23: qty 1

## 2013-05-23 MED ORDER — TECHNETIUM TC 99M SULFUR COLLOID FILTERED
1.0000 | Freq: Once | INTRAVENOUS | Status: AC | PRN
  Administered 2013-05-23: 1 via INTRADERMAL

## 2013-05-23 MED ORDER — METHYLENE BLUE 1 % INJ SOLN
INTRAMUSCULAR | Status: AC
  Filled 2013-05-23: qty 10

## 2013-05-23 MED ORDER — HYDROMORPHONE 0.3 MG/ML IV SOLN
INTRAVENOUS | Status: AC
  Filled 2013-05-23: qty 25

## 2013-05-23 MED ORDER — SODIUM CHLORIDE 0.9 % IR SOLN
Status: DC | PRN
  Administered 2013-05-23 (×2)

## 2013-05-23 MED ORDER — DOCUSATE SODIUM 100 MG PO CAPS
100.0000 mg | ORAL_CAPSULE | Freq: Every day | ORAL | Status: DC
  Administered 2013-05-23 – 2013-05-26 (×4): 100 mg via ORAL
  Filled 2013-05-23 (×4): qty 1

## 2013-05-23 MED ORDER — CEFAZOLIN SODIUM-DEXTROSE 2-3 GM-% IV SOLR
2.0000 g | Freq: Four times a day (QID) | INTRAVENOUS | Status: DC
  Administered 2013-05-23 – 2013-05-26 (×11): 2 g via INTRAVENOUS
  Filled 2013-05-23 (×14): qty 50

## 2013-05-23 MED ORDER — COLCHICINE 0.6 MG PO TABS: 0.6000 mg | ORAL_TABLET | ORAL | Status: DC | PRN

## 2013-05-23 MED ORDER — MIDAZOLAM HCL 5 MG/5ML IJ SOLN
INTRAMUSCULAR | Status: DC | PRN
  Administered 2013-05-23: 1 mg via INTRAVENOUS

## 2013-05-23 MED ORDER — BUPIVACAINE-EPINEPHRINE PF 0.25-1:200000 % IJ SOLN
INTRAMUSCULAR | Status: AC
  Filled 2013-05-23: qty 30

## 2013-05-23 MED ORDER — NALOXONE HCL 0.4 MG/ML IJ SOLN: 0.4000 mg | INTRAMUSCULAR | Status: DC | PRN

## 2013-05-23 MED ORDER — METOPROLOL SUCCINATE ER 25 MG PO TB24
25.0000 mg | ORAL_TABLET | Freq: Every day | ORAL | Status: DC
  Administered 2013-05-24 – 2013-05-26 (×3): 25 mg via ORAL
  Filled 2013-05-23 (×5): qty 1

## 2013-05-23 MED ORDER — FUROSEMIDE 40 MG PO TABS: 40.0000 mg | ORAL_TABLET | Freq: Every day | ORAL | Status: DC | PRN

## 2013-05-23 MED ORDER — GLYCOPYRROLATE 0.2 MG/ML IJ SOLN
INTRAMUSCULAR | Status: DC | PRN
  Administered 2013-05-23: .6 mg via INTRAVENOUS

## 2013-05-23 MED ORDER — METHOCARBAMOL 500 MG PO TABS: 500.0000 mg | ORAL_TABLET | Freq: Four times a day (QID) | ORAL | Status: DC | PRN

## 2013-05-23 MED ORDER — LACTATED RINGERS IV SOLN
INTRAVENOUS | Status: DC | PRN
  Administered 2013-05-23 (×3): via INTRAVENOUS

## 2013-05-23 MED ORDER — EPHEDRINE SULFATE 50 MG/ML IJ SOLN
INTRAMUSCULAR | Status: DC | PRN
  Administered 2013-05-23 (×5): 5 mg via INTRAVENOUS

## 2013-05-23 MED ORDER — ONDANSETRON HCL 4 MG/2ML IJ SOLN: 4.0000 mg | Freq: Four times a day (QID) | INTRAMUSCULAR | Status: DC | PRN

## 2013-05-23 MED ORDER — ONDANSETRON HCL 4 MG/2ML IJ SOLN
INTRAMUSCULAR | Status: DC | PRN
  Administered 2013-05-23: 4 mg via INTRAMUSCULAR

## 2013-05-23 MED ORDER — PROPOFOL 10 MG/ML IV BOLUS
INTRAVENOUS | Status: DC | PRN
  Administered 2013-05-23: 150 mg via INTRAVENOUS

## 2013-05-23 MED ORDER — SIMVASTATIN 20 MG PO TABS
20.0000 mg | ORAL_TABLET | Freq: Every day | ORAL | Status: DC
  Administered 2013-05-23 – 2013-05-25 (×3): 20 mg via ORAL
  Filled 2013-05-23 (×4): qty 1

## 2013-05-23 MED ORDER — ONDANSETRON HCL 4 MG/2ML IJ SOLN: 4.0000 mg | Freq: Once | INTRAMUSCULAR | Status: DC | PRN

## 2013-05-23 MED ORDER — HYDROMORPHONE HCL PF 1 MG/ML IJ SOLN
0.2500 mg | INTRAMUSCULAR | Status: DC | PRN
  Administered 2013-05-23 (×2): 0.5 mg via INTRAVENOUS

## 2013-05-23 MED ORDER — ROCURONIUM BROMIDE 100 MG/10ML IV SOLN
INTRAVENOUS | Status: DC | PRN
  Administered 2013-05-23: 20 mg via INTRAVENOUS
  Administered 2013-05-23: 5 mg via INTRAVENOUS
  Administered 2013-05-23 (×4): 10 mg via INTRAVENOUS
  Administered 2013-05-23 (×2): 5 mg via INTRAVENOUS

## 2013-05-23 MED ORDER — SODIUM CHLORIDE 0.9 % IJ SOLN: 9.0000 mL | INTRAMUSCULAR | Status: DC | PRN

## 2013-05-23 MED ORDER — HYDROMORPHONE HCL PF 1 MG/ML IJ SOLN
INTRAMUSCULAR | Status: AC
  Filled 2013-05-23: qty 1

## 2013-05-23 MED ORDER — PROMETHAZINE HCL 25 MG/ML IJ SOLN: 6.2500 mg | INTRAMUSCULAR | Status: DC | PRN

## 2013-05-23 SURGICAL SUPPLY — 76 items
APPLIER CLIP 9.375 MED OPEN (MISCELLANEOUS)
ATCH SMKEVC FLXB CAUT HNDSWH (FILTER) ×3 IMPLANT
BAG DECANTER FOR FLEXI CONT (MISCELLANEOUS) ×4 IMPLANT
BENZOIN TINCTURE PRP APPL 2/3 (GAUZE/BANDAGES/DRESSINGS) IMPLANT
BINDER BREAST LRG (GAUZE/BANDAGES/DRESSINGS) ×4 IMPLANT
BINDER BREAST XLRG (GAUZE/BANDAGES/DRESSINGS) ×4 IMPLANT
BIOPATCH RED 1 DISK 7.0 (GAUZE/BANDAGES/DRESSINGS) ×16 IMPLANT
BLADE SURG 15 STRL LF DISP TIS (BLADE) ×3 IMPLANT
BLADE SURG 15 STRL SS (BLADE) ×1
CANISTER SUCTION 2500CC (MISCELLANEOUS) ×4 IMPLANT
CHLORAPREP W/TINT 26ML (MISCELLANEOUS) ×4 IMPLANT
CLIP APPLIE 9.375 MED OPEN (MISCELLANEOUS) IMPLANT
CLOTH BEACON ORANGE TIMEOUT ST (SAFETY) ×8 IMPLANT
CONT SPEC 4OZ CLIKSEAL STRL BL (MISCELLANEOUS) ×16 IMPLANT
COVER PROBE W GEL 5X96 (DRAPES) ×4 IMPLANT
COVER SURGICAL LIGHT HANDLE (MISCELLANEOUS) ×8 IMPLANT
DECANTER SPIKE VIAL GLASS SM (MISCELLANEOUS) ×4 IMPLANT
DERMABOND ADVANCED (GAUZE/BANDAGES/DRESSINGS) ×3
DERMABOND ADVANCED .7 DNX12 (GAUZE/BANDAGES/DRESSINGS) ×9 IMPLANT
DRAIN CHANNEL 19F RND (DRAIN) ×12 IMPLANT
DRAPE CHEST BREAST 15X10 FENES (DRAPES) ×4 IMPLANT
DRAPE ORTHO SPLIT 77X108 STRL (DRAPES) ×2
DRAPE PROXIMA HALF (DRAPES) ×12 IMPLANT
DRAPE SURG 17X23 STRL (DRAPES) ×8 IMPLANT
DRAPE SURG ORHT 6 SPLT 77X108 (DRAPES) ×6 IMPLANT
DRAPE UTILITY 15X26 W/TAPE STR (DRAPE) ×8 IMPLANT
DRAPE WARM FLUID 44X44 (DRAPE) ×4 IMPLANT
DRSG OPSITE 4X5.5 SM (GAUZE/BANDAGES/DRESSINGS) IMPLANT
DRSG PAD ABDOMINAL 8X10 ST (GAUZE/BANDAGES/DRESSINGS) ×16 IMPLANT
DRSG TEGADERM 4X4.75 (GAUZE/BANDAGES/DRESSINGS) ×16 IMPLANT
ELECT BLADE 6.5 EXT (BLADE) IMPLANT
ELECT CAUTERY BLADE 6.4 (BLADE) ×8 IMPLANT
ELECT REM PT RETURN 9FT ADLT (ELECTROSURGICAL) ×8
ELECTRODE REM PT RTRN 9FT ADLT (ELECTROSURGICAL) ×6 IMPLANT
EVACUATOR SILICONE 100CC (DRAIN) ×12 IMPLANT
EVACUATOR SMOKE ACCUVAC VALLEY (FILTER) ×1
GLOVE BIO SURGEON STRL SZ7.5 (GLOVE) ×4 IMPLANT
GLOVE BIOGEL PI IND STRL 8 (GLOVE) ×6 IMPLANT
GLOVE BIOGEL PI INDICATOR 8 (GLOVE) ×2
GLOVE ECLIPSE 8.0 STRL XLNG CF (GLOVE) ×4 IMPLANT
GOWN STRL NON-REIN LRG LVL3 (GOWN DISPOSABLE) ×24 IMPLANT
GOWN STRL REIN XL XLG (GOWN DISPOSABLE) ×4 IMPLANT
GRAFT FLEX HD 12X12 THICK (Tissue Mesh) ×4 IMPLANT
IMPL BREAST TIS EXP M 450CC (Breast) ×6 IMPLANT
IMPLANT BREAST TIS EXP M 450CC (Breast) ×8 IMPLANT
KIT BASIN OR (CUSTOM PROCEDURE TRAY) ×8 IMPLANT
KIT ROOM TURNOVER OR (KITS) ×8 IMPLANT
MARKER SKIN DUAL TIP RULER LAB (MISCELLANEOUS) ×4 IMPLANT
NEEDLE 18GX1X1/2 (RX/OR ONLY) (NEEDLE) ×4 IMPLANT
NEEDLE 21 GA WING INFUSION (NEEDLE) ×4 IMPLANT
NEEDLE HYPO 25GX1X1/2 BEV (NEEDLE) ×4 IMPLANT
NS IRRIG 1000ML POUR BTL (IV SOLUTION) ×16 IMPLANT
PACK GENERAL/GYN (CUSTOM PROCEDURE TRAY) ×8 IMPLANT
PACK SURGICAL SETUP 50X90 (CUSTOM PROCEDURE TRAY) ×4 IMPLANT
PAD ARMBOARD 7.5X6 YLW CONV (MISCELLANEOUS) ×8 IMPLANT
PENCIL BUTTON HOLSTER BLD 10FT (ELECTRODE) ×4 IMPLANT
PREFILTER EVAC NS 1 1/3-3/8IN (MISCELLANEOUS) ×4 IMPLANT
SET ASEPTIC TRANSFER (MISCELLANEOUS) ×4 IMPLANT
SPONGE GAUZE 4X4 12PLY (GAUZE/BANDAGES/DRESSINGS) ×8 IMPLANT
SPONGE LAP 18X18 X RAY DECT (DISPOSABLE) ×8 IMPLANT
SPONGE LAP 4X18 X RAY DECT (DISPOSABLE) ×4 IMPLANT
STRIP CLOSURE SKIN 1/2X4 (GAUZE/BANDAGES/DRESSINGS) IMPLANT
SUT ETHILON 2 0 FS 18 (SUTURE) ×4 IMPLANT
SUT MNCRL AB 3-0 PS2 18 (SUTURE) ×16 IMPLANT
SUT MNCRL AB 4-0 PS2 18 (SUTURE) ×8 IMPLANT
SUT MON AB 4-0 PC3 18 (SUTURE) ×4 IMPLANT
SUT PDS AB 3-0 SH 27 (SUTURE) ×8 IMPLANT
SUT PROLENE 3 0 PS 2 (SUTURE) ×8 IMPLANT
SUT VIC AB 3-0 SH 18 (SUTURE) ×12 IMPLANT
SYR BULB IRRIGATION 50ML (SYRINGE) ×4 IMPLANT
SYR CONTROL 10ML LL (SYRINGE) ×4 IMPLANT
TOWEL OR 17X24 6PK STRL BLUE (TOWEL DISPOSABLE) ×8 IMPLANT
TOWEL OR 17X26 10 PK STRL BLUE (TOWEL DISPOSABLE) ×8 IMPLANT
TRAY FOLEY CATH 14FRSI W/METER (CATHETERS) ×4 IMPLANT
TUBE CONNECTING 12X1/4 (SUCTIONS) ×8 IMPLANT
YANKAUER SUCT BULB TIP NO VENT (SUCTIONS) ×4 IMPLANT

## 2013-05-23 NOTE — Progress Notes (Signed)
UR completed.  Jaray Boliver, RN BSN MHA CCM Trauma/Neuro ICU Case Manager [phone]  

## 2013-05-23 NOTE — Anesthesia Procedure Notes (Addendum)
Procedure Name: Intubation Date/Time: 05/23/2013 7:47 AM Performed by: Elon Alas Pre-anesthesia Checklist: Patient identified, Emergency Drugs available, Suction available and Patient being monitored Patient Re-evaluated:Patient Re-evaluated prior to inductionOxygen Delivery Method: Circle system utilized Preoxygenation: Pre-oxygenation with 100% oxygen Intubation Type: IV induction Ventilation: Mask ventilation without difficulty Laryngoscope Size: Miller and 2 Grade View: Grade III Tube type: Oral Tube size: 7.0 mm Number of attempts: 2 Airway Equipment and Method: Bougie stylet,  Stylet and LTA kit utilized Placement Confirmation: ETT inserted through vocal cords under direct vision,  positive ETCO2 and breath sounds checked- equal and bilateral Secured at: 22 cm Tube secured with: Tape Dental Injury: Teeth and Oropharynx as per pre-operative assessment  Comments: Intubation by Vita Barley CRNA

## 2013-05-23 NOTE — Transfer of Care (Signed)
Immediate Anesthesia Transfer of Care Note  Patient: Tracey [name]  Procedure(s) Performed: Procedure(s) with comments: TOTAL MASTECTOMY (Bilateral) AXILLARY SENTINEL NODE BIOPSY (Right) - nuc med injection 7:00 BILATERAL BREAST RECONSTRUCTION WITH PLACEMENT OF TISSUE EXPANDER AND FLEX HD (Bilateral)  Patient Location: PACU  Anesthesia Type:General  Level of Consciousness: awake and alert   Airway & Oxygen Therapy: Patient Spontanous Breathing and Patient connected to nasal cannula oxygen  Post-op Assessment: Report given to PACU RN, Post -op Vital signs reviewed and stable and Patient moving all extremities X 4  Post vital signs: Reviewed and stable  Complications: No apparent anesthesia complications

## 2013-05-23 NOTE — Addendum Note (Signed)
Addendum created 05/23/13 1324 by Rivka Barbara, MD   Modules edited: Anesthesia Events

## 2013-05-23 NOTE — Brief Op Note (Signed)
05/23/2013  12:55 PM  PATIENT:  Tracey [name]  67 y.o. female  PRE-OPERATIVE DIAGNOSIS:  right breast cancer   POST-OPERATIVE DIAGNOSIS:  right breast cancer   PROCEDURE:  Procedure(s) with comments: TOTAL MASTECTOMY (Bilateral) AXILLARY SENTINEL NODE BIOPSY (Right) - nuc med injection 7:00 BILATERAL BREAST RECONSTRUCTION WITH PLACEMENT OF TISSUE EXPANDER AND FLEX HD (Bilateral)  SURGEON:  Surgeon(s) and Role: Panel 1:    * Adolph Pollack, MD - Primary  Panel 2:    * Etter Sjogren, MD - Primary  PHYSICIAN ASSISTANT:   ASSISTANTS: none   ANESTHESIA:   general  EBL:  Total I/O In: 2400 [I.V.:2400] Out: 615 [Urine:465; Blood:150]  BLOOD ADMINISTERED:none  DRAINS: (4) Jackson-Pratt drain(s) with closed bulb suction in the left chest (2) and right chest (2)   LOCAL MEDICATIONS USED:  NONE  SPECIMEN:  No Specimen  DISPOSITION OF SPECIMEN:  N/A  COUNTS:  YES  TOURNIQUET:  * No tourniquets in log *  DICTATION: .Other Dictation: Dictation Number [id number]  PLAN OF CARE: Admit to inpatient   PATIENT DISPOSITION:  PACU - hemodynamically stable.   Delay start of Pharmacological VTE agent (>24hrs) due to surgical blood loss or risk of bleeding: no

## 2013-05-23 NOTE — Brief Op Note (Signed)
05/23/2013  9:41 AM  PATIENT:  [name]  67 y.o. female  PRE-OPERATIVE DIAGNOSIS:  right breast cancer   POST-OPERATIVE DIAGNOSIS:  right breast cancer   PROCEDURE:  Procedure(s) with comments: TOTAL MASTECTOMY (Bilateral) AXILLARY SENTINEL NODE BIOPSY (Right) - nuc med injection 7:00 BILATERAL BREAST RECONSTRUCTION WITH PLACEMENT OF TISSUE EXPANDER/POSSIBLE USE OF FLEX HD (Bilateral)  SURGEON:  Surgeon(s) and Role: Panel 1:    * Adolph Pollack, MD - Primary  Panel 2:    * Etter Sjogren, MD - Primary  PHYSICIAN ASSISTANT:   ASSISTANTS: none   ANESTHESIA:   general  EBL:  Total I/O In: 1000 [I.V.:1000] Out: 300 [Urine:225; Blood:75]  BLOOD ADMINISTERED:none  DRAINS: Per Dr. Odis Luster   LOCAL MEDICATIONS USED:  NONE  SPECIMEN:  Simple Mastectomy bilateral with right axillary sentinel lymph nodes  DISPOSITION OF SPECIMEN:  PATHOLOGY   TOURNIQUET:  * No tourniquets in log *  DICTATION: .Reubin Milan Dictation

## 2013-05-23 NOTE — Op Note (Signed)
Operative Note  Tracey [name] female 67 y.o. 05/23/2013  PREOPERATIVE DX:  Invasive right breast cancer  POSTOPERATIVE DX:  Same  PROCEDURE:  Right axillary lymphatic mapping. Right axillary sentinel lymph node biopsy. Bilateral mastectomies.         Surgeon: Adolph Pollack   Assistants: None  Anesthesia: General endotracheal anesthesia  Indications:  This is a 67 year old female with recently diagnosed invasive right breast cancer. After being given all the options, she has chosen bilateral mastectomies with reconstruction as well as right axillary sentinel lymph node biopsy.     Procedure Detail:  She was seen in the holding area in the right axillary area marked my initials. Injection of radioactive material was performed in the right breast. She is brought to the operating room placed supine on the operating table and a general anesthetic was given. A Foley catheter was inserted. The chest upper arms and axillary areas were widely sterilely prepped and draped.  Beginning on the left side, an elliptical incision was made in the skin of the left breast to include the nipple areolar complex. Subcutaneous flaps were then raised superiorly to the clavicle, medially to the lateral border of the sternum, inferiorly to the anterior rectus sheath, and laterally to the latissimus dorsi muscle using electrocautery. Bleeding was controlled with cautery. The left breast was then dissected free from the underlying pectoralis major muscle fascia using electrocautery. The medial aspect was marked and it was sent to pathology. The wound is irrigated and hemostasis was adequate. Warm laparotomy pads were then placed in the balloon and the skin was closed over these with staples.  Next, the right axilla was approached. Using the neoprobe axillary mapping was performed and an area of increased counts was noted in the inferior right axillary area. A transverse incision was made through the skin and  subcutaneous tissue. Using the neoprobe I identified a lymph node with increased counts. This was in the 4000 range. This lymph node was removed as sentinel lymph node #1. I subsequently identified 3 other lymph nodes with counts greater than or equal to 400 and removed these as well. Following this no other significant counts were noted in the right axilla. These lymph nodes were sent to pathology. The wound was inspected and hemostasis was adequate. The right axillary wound was then closed by approximating the subcutaneous tissue with interrupted 3-0 Vicryl sutures. The skin was closed with a 4 Monocryl subcuticular stitch.  Next, an elliptical incision was made in the skin of the right breast to include the nipple areolar complex. Subcutaneous flaps were superiorly to the clavicle, medially to the lateral border of the sternum, inferiorly to the anterior rectus sheath, and laterally to the latissimus dorsi muscle. The breast tissues and dissected free from the pectoralis major muscle and fascia using electrocautery. The medial aspect of the breast was marked with a suture and was handed off the field. The was inspected and bleeding was controlled electrocautery and hemoclips. Once hemostasis was adequate the wound is irrigated. Warm moist laparotomy pads were then placed in the wound and the skin was closed with staples.  Dr. Odis Luster is going to follow with reconstruction. She tolerated this part of the procedure well without any apparent complications.   Estimated Blood Loss:  300 mL  Blood Given: none          Specimens: Right axillary sentinel lymph nodes. Right breast. The left breast.        Complications:  * No complications entered  in OR log *

## 2013-05-23 NOTE — H&P (Signed)
Tracey [name] is an 67 y.o. female.   Chief Complaint:   Here for elective surgery for breast cancer HPI:   She has a new diagnosis of invasive ductal carcinoma of the right breast. She had screening detected abnormality in the right breast. She normally gets her mammograms in [place] and she was there for her mammogram. Biopsy was performed under image guidance and demonstrated invasive ductal carcinoma. It is estrogen receptor positive. It is a grade 1. It is in the upper outer quadrant of the right breast and it measures approximately 4 mm by ultrasound.    Past Medical History  Diagnosis Date  . Arthritis   . Gout     takes Allopurinol daily and Colchicine daily prn;last attack 19yrs ago  . Tachycardia     takes Metoprolol daily  . Hyperlipidemia     takes Pravastatin daily  . Gout   . Breast cancer 2014    ER+/PR+/HEr2-,   . Colon polyps     2 polyps by report  . Rhinitis     uses Flonase prn  . Insomnia     takes Ambien nightly prn  . Anxiety     takes Ativan daily prn  . Peripheral edema     takes Furosemide daily prn  . PONV (postoperative nausea and vomiting)   . History of bronchitis     last time at least 62yrs ago  . Osteoarthritis   . Bruises easily   . GERD (gastroesophageal reflux disease)     occasionally takes Nexium   . Diverticulosis   . History of bladder infections     many yrs ago  . Depression     takes Effexor daily    Past Surgical History  Procedure Laterality Date  . Appendectomy    . Gallbladder surgery    . Cholecystectomy    . Cosmetic surgery    . Tonsillectomy    . Abdominal hysterectomy    . Cardiac catheterization  1999  . Colonoscopy    . Bilateral cataract surgery    . Urinary tightening      d/t urinary incontinence    Family History  Problem Relation Age of Onset  . Breast cancer Mother     possible inflammatory breast cancer  . Heart attack Father   . Heart disease Father   . ALS Sister   . Breast cancer  Maternal Grandmother 67  . Heart attack Maternal Grandfather   . Heart attack Paternal Grandfather   . Breast cancer Maternal Aunt     dx in her 41s  . Breast cancer Other     maternal great grandmother; dx in her 64s   Social History:  reports that she has quit smoking. She started smoking about 20 years ago. She has never used smokeless tobacco. She reports that she drinks about 3.0 ounces of alcohol per week. She reports that she does not use illicit drugs.  Allergies:  Allergies  Allergen Reactions  . Amoxicillin Nausea And Vomiting  . Ivp Dye [Iodinated Diagnostic Agents] Hives  . Sulfa Antibiotics Swelling    Medications Prior to Admission  Medication Sig Dispense Refill  . allopurinol (ZYLOPRIM) 100 MG tablet Take 100 mg by mouth daily.      Marland Kitchen anastrozole (ARIMIDEX) 1 MG tablet Take 1 tablet (1 mg total) by mouth daily.  30 tablet  5  . celecoxib (CELEBREX) 200 MG capsule Take 200 mg by mouth daily.      Marland Kitchen  colchicine (COLCRYS) 0.6 MG tablet Take 0.6 mg by mouth as needed (gout).       . Cyanocobalamin (VITAMIN B-12 IJ) Inject as directed every 30 (thirty) days.      . fluticasone (FLONASE) 50 MCG/ACT nasal spray Place 1 spray into the nose daily as needed for rhinitis.       Marland Kitchen loratadine-pseudoephedrine (CLARITIN-D 12-HOUR) 5-120 MG per tablet Take 1 tablet by mouth 2 (two) times daily as needed for allergies.      . metoprolol succinate (TOPROL-XL) 25 MG 24 hr tablet Take 25 mg by mouth daily.      . pravastatin (PRAVACHOL) 40 MG tablet Take 40 mg by mouth daily.      Marland Kitchen venlafaxine XR (EFFEXOR-XR) 37.5 MG 24 hr capsule Take 1 capsule (37.5 mg total) by mouth daily.  30 capsule  4  . zolpidem (AMBIEN) 5 MG tablet Take 5 mg by mouth at bedtime as needed for sleep.      . furosemide (LASIX) 40 MG tablet Take 40 mg by mouth daily as needed for fluid.       Marland Kitchen LORazepam (ATIVAN) 0.5 MG tablet Take 0.5 mg by mouth at bedtime as needed for anxiety (or sleep).        No results  found for this or any previous visit (from the past 48 hour(s)). No results found.  Review of Systems  Constitutional: Negative for fever and chills.  Gastrointestinal: Negative for nausea, vomiting and abdominal pain.    Blood pressure 162/66, pulse 62, temperature 97.1 F (36.2 C), temperature source Oral, resp. rate 20, SpO2 99.00%. Physical Exam  Constitutional: She appears well-developed and well-nourished. No distress.  HENT:  Head: Normocephalic and atraumatic.  Neck: Neck supple.  Cardiovascular: Normal rate and regular rhythm.   Respiratory: Effort normal and breath sounds normal.  No palpable breast masses.  GI: Soft. There is no tenderness.  Musculoskeletal: She exhibits no edema.  Lymphadenopathy:    She has no cervical adenopathy.  Neurological: She is alert.  Skin: Skin is warm and dry.     Assessment/Plan Right breast cancer  Plan:  Bilateral mastectomies, right axillary sentinel LN biopsy, reconstruction.  Beniah Magnan J 05/23/2013, 7:24 AM

## 2013-05-23 NOTE — Anesthesia Postprocedure Evaluation (Signed)
  Anesthesia Post-op Note  Patient: Tracey [name]  Procedure(s) Performed: Procedure(s) with comments: TOTAL MASTECTOMY (Bilateral) AXILLARY SENTINEL NODE BIOPSY (Right) - nuc med injection 7:00 BILATERAL BREAST RECONSTRUCTION WITH PLACEMENT OF TISSUE EXPANDER AND FLEX HD (Bilateral)  Patient Location: PACU  Anesthesia Type:General  Level of Consciousness: awake, oriented, sedated and patient cooperative  Airway and Oxygen Therapy: Patient Spontanous Breathing  Post-op Pain: mild  Post-op Assessment: Post-op Vital signs reviewed, Patient's Cardiovascular Status Stable, Respiratory Function Stable, Patent Airway, No signs of Nausea or vomiting and Pain level controlled  Post-op Vital Signs: stable  Complications: No apparent anesthesia complications

## 2013-05-23 NOTE — Preoperative (Signed)
Beta Blockers   Reason not to administer Beta Blockers:Not Applicable, patient took metoprolol 05/23/13.

## 2013-05-23 NOTE — Anesthesia Preprocedure Evaluation (Addendum)
Anesthesia Evaluation  Patient identified by MRN, date of birth, ID band Patient awake    Reviewed: Allergy & Precautions, H&P , NPO status , Patient's Chart, lab work & pertinent test results, reviewed documented beta blocker date and time   History of Anesthesia Complications (+) PONV  Airway Mallampati: II TM Distance: >3 FB Neck ROM: Full  Mouth opening: Limited Mouth Opening  Dental  (+) Teeth Intact and Dental Advisory Given   Pulmonary sleep apnea and Continuous Positive Airway Pressure Ventilation ,          Cardiovascular     Neuro/Psych PSYCHIATRIC DISORDERS Anxiety Depression    GI/Hepatic GERD-  ,  Endo/Other    Renal/GU      Musculoskeletal  (+) Arthritis -,   Abdominal   Peds  Hematology   Anesthesia Other Findings   Reproductive/Obstetrics                         Anesthesia Physical Anesthesia Plan  ASA: II  Anesthesia Plan: General   Post-op Pain Management:    Induction: Intravenous  Airway Management Planned: Oral ETT  Additional Equipment:   Intra-op Plan:   Post-operative Plan: Extubation in OR  Informed Consent: I have reviewed the patients History and Physical, chart, labs and discussed the procedure including the risks, benefits and alternatives for the proposed anesthesia with the patient or authorized representative who has indicated his/her understanding and acceptance.     Plan Discussed with: CRNA, Anesthesiologist and Surgeon  Anesthesia Plan Comments:         Anesthesia Quick Evaluation

## 2013-05-24 MED ORDER — HYDROMORPHONE HCL PF 1 MG/ML IJ SOLN
0.5000 mg | INTRAMUSCULAR | Status: DC | PRN
  Administered 2013-05-24 (×2): 1 mg via INTRAVENOUS
  Filled 2013-05-24: qty 1

## 2013-05-24 MED ORDER — HYDROMORPHONE HCL 2 MG PO TABS
2.0000 mg | ORAL_TABLET | ORAL | Status: DC | PRN
  Administered 2013-05-24: 2 mg via ORAL
  Filled 2013-05-24: qty 1

## 2013-05-24 MED ORDER — HYDROMORPHONE HCL PF 1 MG/ML IJ SOLN
INTRAMUSCULAR | Status: AC
  Administered 2013-05-24: 1 mg via INTRAVENOUS
  Filled 2013-05-24: qty 1

## 2013-05-24 MED ORDER — METHOCARBAMOL 500 MG PO TABS
500.0000 mg | ORAL_TABLET | Freq: Three times a day (TID) | ORAL | Status: DC
  Administered 2013-05-24 – 2013-05-26 (×9): 500 mg via ORAL
  Filled 2013-05-24 (×14): qty 1

## 2013-05-24 MED ORDER — HYDROMORPHONE HCL 2 MG PO TABS
4.0000 mg | ORAL_TABLET | ORAL | Status: DC | PRN
  Administered 2013-05-24: 4 mg via ORAL
  Filled 2013-05-24: qty 2

## 2013-05-24 MED ORDER — HYDROMORPHONE HCL 2 MG PO TABS
2.0000 mg | ORAL_TABLET | ORAL | Status: DC | PRN
  Administered 2013-05-24 – 2013-05-25 (×3): 4 mg via ORAL
  Filled 2013-05-24 (×4): qty 2

## 2013-05-24 NOTE — Op Note (Signed)
NAMEELODIE, PANAMENO NO.:  [id number]  MEDICAL RECORD NO.:  [id number]  LOCATION:                                 FACILITY:  PHYSICIAN:  Etter Sjogren, M.D.     DATE OF BIRTH:  Feb 17, 1946  DATE OF PROCEDURE:  05/23/2013 DATE OF DISCHARGE:  05/23/2013                              OPERATIVE REPORT   PREOPERATIVE DIAGNOSIS:  Breast cancer.  POSTOPERATIVE DIAGNOSIS:  Breast cancer.  PROCEDURE PERFORMED: 1. Left breast reconstruction with tissue expander. 2. Right breast reconstruction with tissue expander. 3. Right chest wall reconstruction with placement of acellular dermal     matrix 50 cm2 for inadequate muscle and setting of inframammary     fold.  SURGEON:  Etter Sjogren, M.D.  ANESTHESIA:  General.  ESTIMATED BLOOD LOSS:  50 mL.  DRAINS:  Two 19-French on each side.  CLINICAL NOTE:  A 67 year old woman who has breast cancer and is having bilateral mastectomy and desires reconstruction.  Options were discussed and she selected placement of tissue expanders immediate as a planned stage procedure for eventual placement of her implants.  The nature of the procedure and risks plus complications were discussed with her in detail.  These risks include, but not limited to, bleeding, infection, healing problems, scarring, loss of sensation, fluid accumulations, anesthesia complications, asymmetry, contour deformities, contour deformities at the periphery of the reconstruction, pneumothorax, DVT, pulmonary embolism, failure of device, capsular contracture, displacement device, wrinkles, ripples, and overall disappointment as well as chronic pain.  She understood all this and wished to proceed. She also understood the possible use of acellular dermal matrix and the rationale for its use.  DESCRIPTION OF PROCEDURE:  The patient was in the operating room and the bilateral mastectomy completed.  The skin flaps were inspected and found to have excellent color  and it was felt that they were viable.  Thorough irrigation with saline and meticulous hemostasis with electrocautery. The dissection then carried deep to the pectoralis major muscles and submuscular space was developed.  The serratus anterior was elevated lateral to very, very short distance.  Dissection was continued down to the inframammary crease.  On the right side, there was a little bit of inadequacy of the muscle and was felt that this area would have to be supplemented by acellular dermal matrix (flex HD).  Thorough irrigation with saline and antibiotic solution and then hemostasis with electrocautery.  After thoroughly cleaning gloves, the expanders were prepared.  These were mentor 450 mL moderate height tissue expanders.  A 100 mL sterile saline placed using a closed filling system and all the air removed.  The expanders were soaked in the antibiotic solution as was the wound itself.  After soaking for 5 minutes, the expanders were positioned.  On the left side, the muscle closed very nicely using the 3- 0 Vicryl interrupted figure-of-eight sutures with great care taken to avoid underlying tissue expander which was kept under direct vision all times. On the right side, there was an area inferolateral that did not have adequate muscle twitch and it was felt that this area would need to be septum and side of the acellular dermal matrix.  The flex HD was brought into the field and it was soaked in saline for greater than 10 minutes and then antibiotic solution for greater than 10 minutes.  Care was taken to make sure that it was positioned with the dermal side up and facing the overlying mastectomy flaps with the epidermal side down towards the tissue expander.  This was secured in place with running 3-0 PDS around the periphery.  Again, great care taken to avoid damage to the underlying tissue expander which was kept under direct vision all times.  The superolateral muscle  closure with 3-0 Vicryl interrupted figure-of-eight sutures.  Antibiotic solution was again placed in the spaces.  Meticulous hemostasis was achieved using electrocautery.  Two 19-French drains were positioned on each side, brought through separate stab wounds inferiorly and secured with a 3-0 Prolene suture.  Care was taken to make sure that the subcutaneous tunnel for the drains was greater than 5 cm.  The skin closure with 3-0 Monocryl interrupted inverted deep dermal sutures, and a short run of 4-0 Monocryl running subcuticular suture only were needed in order to avoiding any devascularization of the skin flaps.  The skin flaps had excellent color and bright red bleeding all the way at the very periphery consistent with viability.  An additional 80 mL was placed on the left side and the skin continued to have excellent color.  On the right side, it was felt that no further fill should be performed at this time.  Dermabond was used to seal the wounds and Biopatch with Tegaderm around the drains and the dry sterile dressings placed in the chest vest, and she was transferred to the recovery room in stable, having tolerated the procedure well.     Etter Sjogren, M.D.   ______________________________ Etter Sjogren, M.D.    DB/MEDQ  D:  05/23/2013  T:  05/24/2013  Job:  [id number]

## 2013-05-24 NOTE — Progress Notes (Signed)
1 Day Post-Op  Subjective: Sore.  Eating breakfast.  Objective: Vital signs in last 24 hours: Temp:  [97.7 F (36.5 C)-98.7 F (37.1 C)] 98.2 F (36.8 C) (10/08 0532) Pulse Rate:  [71-90] 82 (10/08 0532) Resp:  [11-20] 16 (10/08 0538) BP: (99-159)/(44-75) 127/47 mmHg (10/08 0532) SpO2:  [98 %-100 %] 100 % (10/08 0538) FiO2 (%):  [38 %-40 %] 40 % (10/08 0538) Weight:  [161 lb 13.1 oz (73.4 kg)] 161 lb 13.1 oz (73.4 kg) (10/07 1524) Last BM Date: 05/22/13  Intake/Output from previous day: 10/07 0701 - 10/08 0700 In: 4000 [I.V.:4000] Out: 2800 [Urine:2315; Drains:335; Blood:150] Intake/Output this shift:    PE: General- In NAD Chest- incisions are clean, dry and intact  Lab Results:  No results found for this basename: WBC, HGB, HCT, PLT,  in the last 72 hours BMET No results found for this basename: NA, K, CL, CO2, GLUCOSE, BUN, CREATININE, CALCIUM,  in the last 72 hours PT/INR No results found for this basename: LABPROT, INR,  in the last 72 hours Comprehensive Metabolic Panel:    Component Value Date/Time   NA 137 05/16/2013 1540   NA 141 04/05/2013 0803   K 4.3 05/16/2013 1540   K 4.1 04/05/2013 0803   CL 101 05/16/2013 1540   CO2 24 05/16/2013 1540   CO2 23 04/05/2013 0803   BUN 21 05/16/2013 1540   BUN 18.8 04/05/2013 0803   CREATININE 1.03 05/16/2013 1540   CREATININE 1.0 04/05/2013 0803   GLUCOSE 90 05/16/2013 1540   GLUCOSE 115 04/05/2013 0803   CALCIUM 9.9 05/16/2013 1540   CALCIUM 9.5 04/05/2013 0803   AST 23 05/16/2013 1540   AST 21 04/05/2013 0803   ALT 22 05/16/2013 1540   ALT 25 04/05/2013 0803   ALKPHOS 71 05/16/2013 1540   ALKPHOS 67 04/05/2013 0803   BILITOT 0.4 05/16/2013 1540   BILITOT 0.50 04/05/2013 0803   PROT 7.5 05/16/2013 1540   PROT 7.4 04/05/2013 0803   ALBUMIN 4.3 05/16/2013 1540   ALBUMIN 3.9 04/05/2013 0803     Studies/Results: Nm Sentinel Node Inj-no Rpt (breast)  05/23/2013   CLINICAL DATA: Right breast cancer   Sulfur colloid was injected  intradermally by the nuclear medicine  technologist for breast cancer sentinel node localization.     Anti-infectives: Anti-infectives   Start     Dose/Rate Route Frequency Ordered Stop   05/23/13 1600  ceFAZolin (ANCEF) IVPB 2 g/50 mL premix     2 g 100 mL/hr over 30 Minutes Intravenous 4 times per day 05/23/13 1515     05/23/13 1152  polymyxin B 500,000 Units, bacitracin 50,000 Units in sodium chloride irrigation 0.9 % 500 mL irrigation  Status:  Discontinued       As needed 05/23/13 1152 05/23/13 1242   05/23/13 0600  ceFAZolin (ANCEF) IVPB 2 g/50 mL premix     2 g 100 mL/hr over 30 Minutes Intravenous On call to O.R. 05/22/13 1316 05/23/13 0748      Assessment Active Problems:   Cancer of central portion of female breast s/p bilateral mastectomies and right axillary SLNBx with reconstruction on 05/23/13-doing well thus far.    LOS: 1 day   Plan: Hopefully home tomorrow.   Tracey [name] J 05/24/2013

## 2013-05-24 NOTE — Progress Notes (Signed)
Subjective: Sore. Good pain control. Tolerating pos.  Objective: Vital signs in last 24 hours: Temp:  [97.7 F (36.5 C)-98.7 F (37.1 C)] 98.2 F (36.8 C) (10/08 0532) Pulse Rate:  [71-90] 82 (10/08 0532) Resp:  [11-20] 16 (10/08 0538) BP: (99-159)/(44-75) 127/47 mmHg (10/08 0532) SpO2:  [98 %-100 %] 100 % (10/08 0538) FiO2 (%):  [38 %-40 %] 40 % (10/08 0538) Weight:  [161 lb 13.1 oz (73.4 kg)] 161 lb 13.1 oz (73.4 kg) (10/07 1524)  Intake/Output from previous day: 10/07 0701 - 10/08 0700 In: 4000 [I.V.:4000] Out: 2800 [Urine:2315; Drains:335; Blood:150] Intake/Output this shift:    Operative sites: Mastectomy flaps viable. Excellent color throughout. Tissue expanders appear in good position. Drains functioning. Drainage thin.  No results found for this basename: WBC, HGB, HCT, PLATELETS, NA, K, CL, CO2, BUN, CREATININE, GLU,  in the last 72 hours  Studies/Results: Nm Sentinel Node Inj-no Rpt (breast)  05/23/2013   CLINICAL DATA: Right breast cancer   Sulfur colloid was injected intradermally by the nuclear medicine  technologist for breast cancer sentinel node localization.     Assessment/Plan: Ambulate. Advance diet. Encourage spirometry. Continue antibiotic.   LOS: 1 day    Etter Sjogren M 05/24/2013 7:39 AM

## 2013-05-25 MED ORDER — ACETAMINOPHEN 325 MG PO TABS
650.0000 mg | ORAL_TABLET | Freq: Four times a day (QID) | ORAL | Status: DC
  Administered 2013-05-25 – 2013-05-26 (×5): 650 mg via ORAL
  Filled 2013-05-25 (×5): qty 2

## 2013-05-25 MED ORDER — HYDROMORPHONE HCL 2 MG PO TABS
2.0000 mg | ORAL_TABLET | ORAL | Status: DC | PRN
  Administered 2013-05-25: 2 mg via ORAL
  Administered 2013-05-25 – 2013-05-26 (×4): 4 mg via ORAL
  Filled 2013-05-25 (×2): qty 2
  Filled 2013-05-25 (×2): qty 1
  Filled 2013-05-25 (×2): qty 2
  Filled 2013-05-25: qty 1

## 2013-05-25 MED ORDER — HYDROMORPHONE HCL PF 1 MG/ML IJ SOLN
0.5000 mg | INTRAMUSCULAR | Status: DC | PRN
  Administered 2013-05-25: 1 mg via INTRAVENOUS
  Filled 2013-05-25 (×2): qty 1

## 2013-05-25 NOTE — Progress Notes (Signed)
2 Days Post-Op  Subjective: Very sore.  Not able to get out of bed without assistance.  Voiding a lot.  Objective: Vital signs in last 24 hours: Temp:  [97.4 F (36.3 C)-98 F (36.7 C)] 97.4 F (36.3 C) (10/08 1330) Pulse Rate:  [84-88] 88 (10/08 1330) Resp:  [16-18] 16 (10/08 1330) BP: (123-124)/(52-56) 123/56 mmHg (10/08 1330) SpO2:  [98 %-100 %] 98 % (10/08 1330) Last BM Date: 05/22/13  Intake/Output from previous day: 10/08 0701 - 10/09 0700 In: 2886 [P.O.:240; I.V.:2546] Out: 365 [Drains:365] Intake/Output this shift:    PE: General- In NAD Chest- incisions are clean, dry and intact  Lab Results:  No results found for this basename: WBC, HGB, HCT, PLT,  in the last 72 hours BMET No results found for this basename: NA, K, CL, CO2, GLUCOSE, BUN, CREATININE, CALCIUM,  in the last 72 hours PT/INR No results found for this basename: LABPROT, INR,  in the last 72 hours Comprehensive Metabolic Panel:    Component Value Date/Time   NA 137 05/16/2013 1540   NA 141 04/05/2013 0803   K 4.3 05/16/2013 1540   K 4.1 04/05/2013 0803   CL 101 05/16/2013 1540   CO2 24 05/16/2013 1540   CO2 23 04/05/2013 0803   BUN 21 05/16/2013 1540   BUN 18.8 04/05/2013 0803   CREATININE 1.03 05/16/2013 1540   CREATININE 1.0 04/05/2013 0803   GLUCOSE 90 05/16/2013 1540   GLUCOSE 115 04/05/2013 0803   CALCIUM 9.9 05/16/2013 1540   CALCIUM 9.5 04/05/2013 0803   AST 23 05/16/2013 1540   AST 21 04/05/2013 0803   ALT 22 05/16/2013 1540   ALT 25 04/05/2013 0803   ALKPHOS 71 05/16/2013 1540   ALKPHOS 67 04/05/2013 0803   BILITOT 0.4 05/16/2013 1540   BILITOT 0.50 04/05/2013 0803   PROT 7.5 05/16/2013 1540   PROT 7.4 04/05/2013 0803   ALBUMIN 4.3 05/16/2013 1540   ALBUMIN 3.9 04/05/2013 0803     Studies/Results: No results found.  Anti-infectives: Anti-infectives   Start     Dose/Rate Route Frequency Ordered Stop   05/23/13 1600  ceFAZolin (ANCEF) IVPB 2 g/50 mL premix     2 g 100 mL/hr over 30 Minutes  Intravenous 4 times per day 05/23/13 1515     05/23/13 1152  polymyxin B 500,000 Units, bacitracin 50,000 Units in sodium chloride irrigation 0.9 % 500 mL irrigation  Status:  Discontinued       As needed 05/23/13 1152 05/23/13 1242   05/23/13 0600  ceFAZolin (ANCEF) IVPB 2 g/50 mL premix     2 g 100 mL/hr over 30 Minutes Intravenous On call to O.R. 05/22/13 1316 05/23/13 0748      Assessment Active Problems:   Cancer of central portion of female breast s/p bilateral mastectomies and right axillary SLNBx with reconstruction on 05/23/13-poor pain control at this time; not able to get OOB without assistance    LOS: 2 days   Plan: Heplock IV.  Add Tylenol.  Change frequency of pain meds.  Home when she is more independent and pain is under better control.   Tracey [name] 05/25/2013

## 2013-05-25 NOTE — Progress Notes (Signed)
Subjective: Continues to be very sore. Having difficulty ambulating. Tolerating diet.  Objective: Vital signs in last 24 hours: Temp:  [97.4 F (36.3 C)-98.4 F (36.9 C)] 98.4 F (36.9 C) (10/09 0926) Pulse Rate:  [87-88] 87 (10/09 0926) Resp:  [16-18] 17 (10/09 0926) BP: (123-132)/(56-64) 132/64 mmHg (10/09 0926) SpO2:  [98 %-99 %] 98 % (10/09 0926)  Intake/Output from previous day: 10/08 0701 - 10/09 0700 In: 2886 [P.O.:240; I.V.:2546] Out: 365 [Drains:365] Intake/Output this shift: Total I/O In: 648.3 [P.O.:240; I.V.:408.3] Out: -   Operative sites: Mastectomy flaps viable. Narrow (2 mm wide) area epidermolysis bilateral. Tissue expanders appear in good position. Drains functioning. Drainage thin. No evidence of bleeding or infection either side..  No results found for this basename: WBC, HGB, HCT, PLATELETS, NA, K, CL, CO2, BUN, CREATININE, GLU,  in the last 72 hours  Studies/Results: No results found.  Assessment/Plan: Encourage ambulation. I have asked her to try to rely more on the PO pain med as opposed to the IV form.   LOS: 2 days    Odis Luster, Lorrena Goranson M 05/25/2013 11:46 AM

## 2013-05-26 ENCOUNTER — Encounter (HOSPITAL_COMMUNITY): Payer: Self-pay | Admitting: General Surgery

## 2013-05-26 ENCOUNTER — Other Ambulatory Visit: Payer: Self-pay | Admitting: Oncology

## 2013-05-26 MED ORDER — METHOCARBAMOL 500 MG PO TABS: 500.0000 mg | ORAL_TABLET | Freq: Three times a day (TID) | ORAL | Status: DC

## 2013-05-26 MED ORDER — ENOXAPARIN SODIUM 40 MG/0.4ML ~~LOC~~ SOLN: 40.0000 mg | SUBCUTANEOUS | Status: DC

## 2013-05-26 MED ORDER — ENOXAPARIN SODIUM 40 MG/0.4ML ~~LOC~~ SOLN: 40.0000 mg | Freq: Every day | SUBCUTANEOUS | Status: DC

## 2013-05-26 MED ORDER — HYDROMORPHONE HCL 2 MG PO TABS: 2.0000 mg | ORAL_TABLET | ORAL | Status: DC | PRN

## 2013-05-26 MED ORDER — DSS 100 MG PO CAPS: 100.0000 mg | ORAL_CAPSULE | Freq: Every day | ORAL | Status: DC

## 2013-05-26 MED ORDER — CEPHALEXIN 500 MG PO CAPS: 500.0000 mg | ORAL_CAPSULE | Freq: Two times a day (BID) | ORAL | Status: DC

## 2013-05-26 NOTE — Progress Notes (Signed)
3 Days Post-Op  Subjective: Feeling much better.  Moving independently.  Objective: Vital signs in last 24 hours: Temp:  [98 F (36.7 C)-99 F (37.2 C)] 98.7 F (37.1 C) (10/10 0614) Pulse Rate:  [63-87] 73 (10/10 0614) Resp:  [17-19] 17 (10/10 0614) BP: (111-142)/(50-64) 139/55 mmHg (10/10 0614) SpO2:  [92 %-98 %] 95 % (10/10 0614) Last BM Date: 05/22/13  Intake/Output from previous day: 10/09 0701 - 10/10 0700 In: 798.3 [P.O.:240; I.V.:408.3; IV Piggyback:150] Out: 295 [Drains:295] Intake/Output this shift:    PE: General- In NAD Chest- incisions are clean, dry and intact  Lab Results:  No results found for this basename: WBC, HGB, HCT, PLT,  in the last 72 hours BMET No results found for this basename: NA, K, CL, CO2, GLUCOSE, BUN, CREATININE, CALCIUM,  in the last 72 hours PT/INR No results found for this basename: LABPROT, INR,  in the last 72 hours Comprehensive Metabolic Panel:    Component Value Date/Time   NA 137 05/16/2013 1540   NA 141 04/05/2013 0803   K 4.3 05/16/2013 1540   K 4.1 04/05/2013 0803   CL 101 05/16/2013 1540   CO2 24 05/16/2013 1540   CO2 23 04/05/2013 0803   BUN 21 05/16/2013 1540   BUN 18.8 04/05/2013 0803   CREATININE 1.03 05/16/2013 1540   CREATININE 1.0 04/05/2013 0803   GLUCOSE 90 05/16/2013 1540   GLUCOSE 115 04/05/2013 0803   CALCIUM 9.9 05/16/2013 1540   CALCIUM 9.5 04/05/2013 0803   AST 23 05/16/2013 1540   AST 21 04/05/2013 0803   ALT 22 05/16/2013 1540   ALT 25 04/05/2013 0803   ALKPHOS 71 05/16/2013 1540   ALKPHOS 67 04/05/2013 0803   BILITOT 0.4 05/16/2013 1540   BILITOT 0.50 04/05/2013 0803   PROT 7.5 05/16/2013 1540   PROT 7.4 04/05/2013 0803   ALBUMIN 4.3 05/16/2013 1540   ALBUMIN 3.9 04/05/2013 0803   1. Breast, simple mastectomy, Left - FIBROCYSTIC CHANGES. - NO EVIDENCE OF MALIGNANCY. 2. Lymph node, sentinel, biopsy, Right axillary #1 - ONE BENIGN LYMPH NODE (0/1). 3. Lymph node, sentinel, biopsy, Right axillary #1 - ONE BENIGN  LYMPH NODE (0/1). 4. Lymph node, sentinel, biopsy, Right axillary #2 - ONE BENIGN LYMPH NODE (0/1). 5. Lymph node, sentinel, biopsy, Right axillary #3 - ONE BENIGN LYMPH NODE (0/1). 6. Lymph node, sentinel, biopsy, Right axillary #4 - ONE BENIGN LYMPH NODE (0/1). 7. Breast, simple mastectomy, Right - INVASIVE DUCTAL CARCINOMA, 0.8 CM. - ANTERIOR SOFT TISSUE MARGINS SUSPICIOUS FOR INVOLVEMENT. - DEEP MARGIN FREE OF TUMOR. Microscopic Comment  Studies/Results: No results found.  Anti-infectives: Anti-infectives   Start     Dose/Rate Route Frequency Ordered Stop   05/26/13 0000  cephALEXin (KEFLEX) 500 MG capsule     500 mg Oral 2 times daily 05/26/13 0844     05/23/13 1600  ceFAZolin (ANCEF) IVPB 2 g/50 mL premix     2 g 100 mL/hr over 30 Minutes Intravenous 4 times per day 05/23/13 1515     05/23/13 1152  polymyxin B 500,000 Units, bacitracin 50,000 Units in sodium chloride irrigation 0.9 % 500 mL irrigation  Status:  Discontinued       As needed 05/23/13 1152 05/23/13 1242   05/23/13 0600  ceFAZolin (ANCEF) IVPB 2 g/50 mL premix     2 g 100 mL/hr over 30 Minutes Intravenous On call to O.R. 05/22/13 1316 05/23/13 0748      Assessment Active Problems:   Cancer of central  portion of female breast s/p bilateral mastectomies and right axillary SLNBx with reconstruction on 05/23/13-she is doing much better; pathology (Stage 1) discussed with them.  I spoke with Dr. Luisa Hart, the pathologist about the suspicious margin which is in the upper central portion.  There appears to be a microscopic focus of tumor that extends up to the cauterize edge.  The thickness of the cauterized area is 2mm (which wound be similar to the thickness of the cauterized area on the flap).  He cannot definitely say the margin is involved nor can he say it is clear.    LOS: 3 days   Plan: Home today.  Will see her in office later this month.  Will forward the pathology report and Dr. Rosezena Sensor comment to Drs.  Magrinat and Michell Heinrich.  Suella Cogar J 05/26/2013

## 2013-05-26 NOTE — Discharge Summary (Signed)
Physician Discharge Summary  Patient ID: Tracey [name] MRN: [id number] DOB/AGE: March 13, 1946 67 y.o.  Admit date: 05/23/2013 Discharge date: 05/26/2013  Admission Diagnoses:Breast cancer  Discharge Diagnoses: Same Active Problems:   Cancer of central portion of female breast s/p bilateral mastectomies and right axillary SLNBx with reconstruction on 05/23/13.   Discharged Condition: good  Hospital Course: On the day of admission the patient was taken to surgery and had bilateral mastectomy and reconstruction tissue expandersd. The patient tolerated the procedures well. Postoperatively, the flap maintained excellent color and capillary refill. The patient was ambulatory and tolerating diet on the first postoperative day. She was slow to ambulate due to pain but has made remarkable progress since yesterday. She did receive DVT prophylaxis pre-op and post-op.  Treatments: antibiotics: Ancef, anticoagulation: heparin and surgery: bilateral mastectomy and reconstruction  Discharge Exam: Blood pressure 139/55, pulse 73, temperature 98.7 F (37.1 C), temperature source Oral, resp. rate 17, height 5\' 3"  (1.6 m), weight 161 lb 13.1 oz (73.4 kg), SpO2 95.00%.  Operative sites: Mastectomy flaps viable. Thin line of epidermolysis (2mm) bilateral. Tissue expanders appear to be OK. Drains functioning. Drainage thin.  Disposition: Discharge home. Patient states she is ready. Follow-up with Dr. Odis Luster next week.   Future Appointments Provider Department Dept Phone   06/09/2013 8:30 AM Dava Najjar [facility]elle [facility] [phone]   06/09/2013 9:00 AM Lowella Dell, MD [facility] MEDICAL ONCOLOGY [phone]       Medication List    STOP taking these medications       celecoxib 200 MG capsule  Commonly known as:  CELEBREX     COLCRYS 0.6 MG tablet  Generic drug:  colchicine     loratadine-pseudoephedrine 5-120 MG per tablet  Commonly known as:   CLARITIN-D 12-hour     zolpidem 5 MG tablet  Commonly known as:  AMBIEN      TAKE these medications       allopurinol 100 MG tablet  Commonly known as:  ZYLOPRIM  Take 100 mg by mouth daily.     anastrozole 1 MG tablet  Commonly known as:  ARIMIDEX  Take 1 tablet (1 mg total) by mouth daily.     cephALEXin 500 MG capsule  Commonly known as:  KEFLEX  Take 1 capsule (500 mg total) by mouth 2 (two) times daily.     DSS 100 MG Caps  Take 100 mg by mouth daily.     enoxaparin 40 MG/0.4ML injection  Commonly known as:  LOVENOX  Inject 0.4 mLs (40 mg total) into the skin daily.  Start taking on:  05/27/2013     fluticasone 50 MCG/ACT nasal spray  Commonly known as:  FLONASE  Place 1 spray into the nose daily as needed for rhinitis.     furosemide 40 MG tablet  Commonly known as:  LASIX  Take 40 mg by mouth daily as needed for fluid.     HYDROmorphone 2 MG tablet  Commonly known as:  DILAUDID  Take 1-2 tablets (2-4 mg total) by mouth every 3 (three) hours as needed for pain.     LORazepam 0.5 MG tablet  Commonly known as:  ATIVAN  Take 0.5 mg by mouth at bedtime as needed for anxiety (or sleep).     methocarbamol 500 MG tablet  Commonly known as:  ROBAXIN  Take 1 tablet (500 mg total) by mouth 4 (four) times daily -  before meals and at bedtime.  metoprolol succinate 25 MG 24 hr tablet  Commonly known as:  TOPROL-XL  Take 25 mg by mouth daily.     pravastatin 40 MG tablet  Commonly known as:  PRAVACHOL  Take 40 mg by mouth daily.     venlafaxine XR 37.5 MG 24 hr capsule  Commonly known as:  EFFEXOR-XR  Take 1 capsule (37.5 mg total) by mouth daily.     VITAMIN B-12 IJ  Inject as directed every 30 (thirty) days.         SignedOdis Luster, Lulubelle Simcoe M 05/26/2013, 8:48 AM

## 2013-05-29 ENCOUNTER — Telehealth: Payer: Self-pay | Admitting: *Deleted

## 2013-05-29 NOTE — Telephone Encounter (Signed)
Spoke to pt about oncotype testing.  Discussed Dr. Darrall Dears reasoning and when we will be sending it out.  R/s pt f/u appt.  Confirmed new appt date and time for 06/28/13 at 1030/1100.  Pt request to be called after discussion of case at tumor board on Wed.  She is in question when she should get reconstruction.  She is concerned about small focus of invasion at margin.  Informed pt that she will receive call from one of her physicians to discuss her next steps.  Pt denies further needs or concerns.  Contact information given.

## 2013-05-30 ENCOUNTER — Other Ambulatory Visit: Payer: Self-pay | Admitting: Oncology

## 2013-06-02 ENCOUNTER — Ambulatory Visit (INDEPENDENT_AMBULATORY_CARE_PROVIDER_SITE_OTHER): Payer: Medicare Other | Admitting: General Surgery

## 2013-06-02 VITALS — Ht 63.0 in | Wt 147.4 lb

## 2013-06-02 DIAGNOSIS — Z4889 Encounter for other specified surgical aftercare: Secondary | ICD-10-CM | POA: Insufficient documentation

## 2013-06-02 NOTE — Patient Instructions (Signed)
We'll see you back in about 3-4 weeks.

## 2013-06-02 NOTE — Progress Notes (Signed)
Procedure:  Bilateral mastectomies, right axillary sentinel lymph node biopsy, reconstruction with tissue expanders.  Date:  05/23/2013  Pathology:  Invasive right breast cancer extending to one of the anterior tissue margins in the upper flap region, precise site unable to be determined. Sentinel lymph nodes negative for tumor. Estrogen receptor positive.  History:  She is here for her first postoperative visit and is getting better. Dr. Odis Luster has removed all but one of her drains.  The positive margin in the superior flap on the right side was discussed at the multidisciplinary breast cancer conference. Radiation was discussed as a high likelihood for this. An oncotype has been sent and is pending.  Exam: General- Is in NAD. Chest wall incisions are clean and intact. Drain present on the right side.  Assessment:  Wounds are healing well. She is aware of the pathology report and need for potential further treatment.  Plan:  Return visit 4 weeks.

## 2013-06-06 ENCOUNTER — Other Ambulatory Visit: Payer: Self-pay | Admitting: Oncology

## 2013-06-08 ENCOUNTER — Encounter: Payer: Self-pay | Admitting: *Deleted

## 2013-06-08 NOTE — Progress Notes (Signed)
Late entry 06/07/13- Oncotype ordered per Dr. Darnelle Catalan.  Order sent to pathology-received by Beverely Low.

## 2013-06-09 ENCOUNTER — Ambulatory Visit: Payer: Medicare Other | Admitting: Oncology

## 2013-06-09 ENCOUNTER — Other Ambulatory Visit: Payer: Medicare Other | Admitting: Lab

## 2013-06-12 ENCOUNTER — Ambulatory Visit: Payer: Medicare Other | Attending: Plastic Surgery | Admitting: Physical Therapy

## 2013-06-12 DIAGNOSIS — R293 Abnormal posture: Secondary | ICD-10-CM | POA: Diagnosis not present

## 2013-06-12 DIAGNOSIS — C50919 Malignant neoplasm of unspecified site of unspecified female breast: Secondary | ICD-10-CM | POA: Diagnosis not present

## 2013-06-12 DIAGNOSIS — IMO0001 Reserved for inherently not codable concepts without codable children: Secondary | ICD-10-CM | POA: Diagnosis not present

## 2013-06-12 NOTE — Progress Notes (Signed)
Location of Breast Cancer:Right upper outer quadrant mass measuring 6 mm. Grade 1  Histology per Pathology Report: Diagnosis 1. Breast, simple mastectomy, Left - FIBROCYSTIC CHANGES. - NO EVIDENCE OF MALIGNANCY. 2. Lymph node, sentinel, biopsy, Right axillary #1 - ONE BENIGN LYMPH NODE (0/1). 3. Lymph node, sentinel, biopsy, Right axillary #1 - ONE BENIGN LYMPH NODE (0/1). 4. Lymph node, sentinel, biopsy, Right axillary #2 - ONE BENIGN LYMPH NODE (0/1). 5. Lymph node, sentinel, biopsy, Right axillary #3 - ONE BENIGN LYMPH NODE (0/1). 6. Lymph node, sentinel, biopsy, Right axillary #4 - ONE BENIGN LYMPH NODE (0/1). 7. Breast, simple mastectomy, Right - INVASIVE DUCTAL CARCINOMA, 0.8 CM. - ANTERIOR SOFT TISSUE MARGINS SUSPICIOUS FOR INVOLVEMENT. - DEEP MARGIN FREE OF TUMOR.  Receptor Status: ER(+), PR (+), Her2-neu no amplification.  Did patient present with symptoms:Suspicious mass of right breast found on screening mammogram July 2014. Biopsy of 6 mm mass found to be invasive ductal carcinoma  Was obtained at [facility] at [facility].  Past/Anticipated interventions by surgeon, if ZOX:[id number] mastectomies, right  axillary sentinel lymph node reconstruction with tissue expanders on 05/23/13 by Dr.Rosenbower.  Past/Anticipated interventions by medical oncology, if any: Chemotherapy:Oncotype ordered by Dr.Magrinat.Chemotherapy not reccommended.May benefit from antiestrogen.  Lymphedema issues, if any:    SAFETY ISSUES:  Prior radiation? No  Pacemaker/ICD: No  Possible current pregnancy?No  Is the patient on methotrexate:No  Current Complaints / other details:Patient is married and has 2 daughters and 3 grandchildren.Menarche at age 70, first full-term birth at age 29.Took HRT until August 2014.    Tessa Lerner, RN 06/12/2013,2:53 PM

## 2013-06-13 ENCOUNTER — Other Ambulatory Visit (INDEPENDENT_AMBULATORY_CARE_PROVIDER_SITE_OTHER): Payer: Self-pay | Admitting: General Surgery

## 2013-06-13 DIAGNOSIS — C50911 Malignant neoplasm of unspecified site of right female breast: Secondary | ICD-10-CM

## 2013-06-15 ENCOUNTER — Ambulatory Visit
Admission: RE | Admit: 2013-06-15 | Discharge: 2013-06-15 | Disposition: A | Discharge status: Home / Self Care | Payer: Medicare Other | Source: Ambulatory Visit | Attending: Radiation Oncology | Admitting: Radiation Oncology

## 2013-06-15 ENCOUNTER — Encounter: Payer: Self-pay | Admitting: *Deleted

## 2013-06-15 ENCOUNTER — Encounter: Payer: Self-pay | Admitting: Radiation Oncology

## 2013-06-15 VITALS — BP 122/71 | HR 76 | Temp 98.1°F | Resp 20 | Wt 147.4 lb

## 2013-06-15 DIAGNOSIS — C50919 Malignant neoplasm of unspecified site of unspecified female breast: Secondary | ICD-10-CM | POA: Diagnosis not present

## 2013-06-15 DIAGNOSIS — C50419 Malignant neoplasm of upper-outer quadrant of unspecified female breast: Secondary | ICD-10-CM | POA: Diagnosis not present

## 2013-06-15 DIAGNOSIS — Z9010 Acquired absence of unspecified breast and nipple: Secondary | ICD-10-CM | POA: Diagnosis not present

## 2013-06-15 DIAGNOSIS — C50111 Malignant neoplasm of central portion of right female breast: Secondary | ICD-10-CM

## 2013-06-15 DIAGNOSIS — Z170 Estrogen receptor positive status [ER+]: Secondary | ICD-10-CM | POA: Insufficient documentation

## 2013-06-15 HISTORY — DX: Allergy, unspecified, initial encounter: T78.40XA

## 2013-06-15 NOTE — Progress Notes (Signed)
[facility] Clinical Social Work  Pt was referred by radiation oncologist for assessment of psychosocial needs.  Clinical Social Worker phoned patient to offer support and assess for needs. Pt appears to have a good understanding of her current condition and need for treatment. Pt trying to process possible treatment options and may do her radiation out in [place]. Pt seemed in a place to be able to make these decisions and reports to have family members that can assist her with her decision making. CSW provided supportive listening, support and Pt is aware and agrees to seek out further assistance as needed.     Doreen Salvage, LCSW Clinical Social Worker [facility] Wednesday, Thursday and Friday Phone: [phone] Fax: [phone]

## 2013-06-15 NOTE — Progress Notes (Signed)
Radiation Oncology         [phone]) [phone] ________________________________  Initial outpatient Consultation - Date: 06/15/2013   Name: Tracey [name] MRN: [id number]   DOB: Sep 16, 1945  REFERRING PHYSICIAN: Adolph Pollack, MD  DIAGNOSIS:  1. 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 female  whom I briefly saw in multidisciplinary breast clinic in August. She had undergone routine screening mammograms in July showing a suspicious mass in the right breast. Additional views confirmed a mass in the upper outer quadrant which by ultrasound measured 4 mm. This workup was completed in [place] at [facility]. 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 She then moved to [facility] to be closer to her children and was seen in multidisciplinary clinic. At that point she had already decided on bilateral mastectomies.  She had her surgery on October 7. The left breast was negative for malignancy. The right breast showed a 0.8 cm invasive ductal carcinoma 5 lymph nodes were negative for malignancy. Unfortunately the anterior soft tissue margin showed a microscopic focus which was altered by cautery effect and suspicious for a positive margin. After review at our multidisciplinary tumor Board it was felt she would benefit from adjuvant radiation to this area due to the uncertainty of her margin status. She was therefore referred to me for consideration of adjuvant radiation. An Oncotype has been set by medical oncology to determine her need for chemotherapy. Estrogen and progesterone receptors were not repeated on her surgical specimen but were strongly positive from her previous biopsy. HER-2 was negative. Her Ki-67 was 11%. She had immediate reconstruction and expander placement at the time of her surgery. She has expanders in place and a sore from this. He has been referred to physical  therapy. Her drains are out. She is tearful today. She is accompanied by her husband and daughter. She and her husband have a second home in [place] and usually leave to spend the winter there in November. She is interested in pursuing radiation there if at all possible. From a gynecological standpoint she had menses at 51 with her first full-term birth at 14. She is GX P2. She was on hormone replacement until August of this year and has recently discontinued that.  PREVIOUS RADIATION THERAPY: No  PAST MEDICAL HISTORY:  has a past medical history of Arthritis; Gout; Tachycardia; Hyperlipidemia; Gout; Breast cancer (2014); Colon polyps; Rhinitis; Insomnia; Anxiety; Peripheral edema; PONV (postoperative nausea and vomiting); History of bronchitis; Osteoarthritis; Bruises easily; GERD (gastroesophageal reflux disease); Diverticulosis; History of bladder infections; Depression; and Allergy.    PAST SURGICAL HISTORY: Past Surgical History  Procedure Laterality Date  . Appendectomy    . Gallbladder surgery    . Cholecystectomy    . Cosmetic surgery    . Tonsillectomy    . Abdominal hysterectomy    . Cardiac catheterization  1999  . Colonoscopy    . Bilateral cataract surgery    . Urinary tightening      d/t urinary incontinence  . Total mastectomy Bilateral 05/23/2013    WITH RECONSTRUCTION  . Total mastectomy Bilateral 05/23/2013    Procedure: TOTAL MASTECTOMY;  Surgeon: Adolph Pollack, MD;  Location: [facility] OR;  Service: General;  Laterality: Bilateral;  . Axillary sentinel node biopsy Right 05/23/2013    Procedure: AXILLARY SENTINEL NODE BIOPSY;  Surgeon: Adolph Pollack, MD;  Location: [facility]  OR;  Service: General;  Laterality: Right;  nuc med injection 7:00  . Breast reconstruction with placement of tissue expander and flex hd (acellular hydrated dermis) Bilateral 05/23/2013    Procedure: BILATERAL BREAST RECONSTRUCTION WITH PLACEMENT OF TISSUE EXPANDER AND FLEX HD;  Surgeon: Etter Sjogren, MD;  Location: [facility] OR;  Service: Plastics;  Laterality: Bilateral;    FAMILY HISTORY:  Family History  Problem Relation Age of Onset  . Breast cancer Mother     possible inflammatory breast cancer  . Heart attack Father   . Heart disease Father   . ALS Sister   . Breast cancer Maternal Grandmother 57  . Heart attack Maternal Grandfather   . Heart attack Paternal Grandfather   . Breast cancer Maternal Aunt     dx in her 72s  . Breast cancer Other     maternal great grandmother; dx in her 15s    SOCIAL HISTORY:  History  Substance Use Topics  . Smoking status: Former Smoker -- 1.00 packs/day for 20 years    Start date: 06/09/1992  . Smokeless tobacco: Never Used     Comment: quit at age 49  . Alcohol Use: 3.0 oz/week    5 Glasses of wine per week     Comment: nightly    ALLERGIES: Amoxicillin; Ivp dye; and Sulfa antibiotics  MEDICATIONS:  Current Outpatient Prescriptions  Medication Sig Dispense Refill  . allopurinol (ZYLOPRIM) 100 MG tablet Take 100 mg by mouth daily.      Marland Kitchen anastrozole (ARIMIDEX) 1 MG tablet Take 1 tablet (1 mg total) by mouth daily.  30 tablet  5  . calcium carbonate 200 MG capsule Take 250 mg by mouth 2 (two) times daily with a meal.      . Cyanocobalamin (VITAMIN B-12 IJ) Inject as directed every 30 (thirty) days.      Marland Kitchen docusate sodium 100 MG CAPS Take 100 mg by mouth daily.  10 capsule  0  . fluticasone (FLONASE) 50 MCG/ACT nasal spray Place 1 spray into the nose daily as needed for rhinitis.       . furosemide (LASIX) 40 MG tablet Take 40 mg by mouth daily as needed for fluid.       Marland Kitchen LORazepam (ATIVAN) 0.5 MG tablet Take 0.5 mg by mouth at bedtime as needed for anxiety (or sleep).      . metoprolol succinate (TOPROL-XL) 25 MG 24 hr tablet Take 25 mg by mouth daily.      . pravastatin (PRAVACHOL) 40 MG tablet Take 40 mg by mouth daily.      Marland Kitchen venlafaxine XR (EFFEXOR-XR) 37.5 MG 24 hr capsule Take 1 capsule (37.5 mg total) by mouth daily.   30 capsule  4  . methocarbamol (ROBAXIN) 500 MG tablet Take 1 tablet (500 mg total) by mouth 4 (four) times daily -  before meals and at bedtime.  30 tablet  1   No current facility-administered medications for this encounter.    REVIEW OF SYSTEMS:  A 15 point review of systems is documented in the electronic medical record. This was obtained by the nursing staff. However, I reviewed this with the patient to discuss relevant findings and make appropriate changes.  Pertinent items are noted in HPI.  PHYSICAL EXAM:  Filed Vitals:   06/15/13 0809  BP: 122/71  Pulse: 76  Temp: 98.1 F (36.7 C)  Resp: 20  .147 lb 6.4 oz (66.86 kg). She is a pleasant female in no distress sitting comfortably  on examining room table. She has no palpable cervical or supra-particular adenopathy. She has bilateral expanders in place. Her scars are healing well. She is alert and oriented x3. She has good range of motion of both arms. She is alert and oriented x3. ECoG performance status 1.  LABORATORY DATA:  Lab Results  Component Value Date   WBC 5.9 05/16/2013   HGB 15.4* 05/16/2013   HCT 43.8 05/16/2013   MCV 89.0 05/16/2013   PLT 174 05/16/2013   Lab Results  Component Value Date   NA 137 05/16/2013   K 4.3 05/16/2013   CL 101 05/16/2013   CO2 24 05/16/2013   Lab Results  Component Value Date   ALT 22 05/16/2013   AST 23 05/16/2013   ALKPHOS 71 05/16/2013   BILITOT 0.4 05/16/2013     RADIOGRAPHY: Nm Sentinel Node Inj-no Rpt (breast)  05/23/2013   CLINICAL DATA: Right breast cancer   Sulfur colloid was injected intradermally by the nuclear medicine  technologist for breast cancer sentinel node localization.       IMPRESSION: T1N0 Right Breast Cancer   PLAN: I spoke to the is wearing family today regarding her diagnosis and options for treatment. We discussed that the usual indications for post mastectomy radiation include positive margins, multiple positive lymph nodes, and tumor size greater than 5 cm. We  discussed the historical data looking at high recurrence rate in patients with positive margins were with women who had a larger tumors and did not receive neoadjuvant chemotherapy prior to mastectomy. The uncertainty at this margin does Bonita Quin some risk for local recurrence and as she is otherwise healthy and the morbidity of radiation is relatively low I think it would be reasonable to proceed on with adjuvant radiation. Her greatest risk of recurrence is in the area where her tumor was and therefore I don't believe she would benefit from full chest wall radiation and certainly not regional nodal radiation. She is currently on [place].6 and Dr. Odis Luster, her plastic surgeon, would like to fully expand her before proceeding on with radiation if at all possible. He believes this will be complete somewhere at the beginning of January. That point she could return to be of her treatments for radiation. Her husband is contacted the breast cancer navigator at the cancer Center at [facility] and I will contact the radiation oncologist there to explain this case and see if he or she would be willing to accept Tracey [name] as a patient. I would like to see her before she leaves and when she is fully expanded to evaluate her expanders status and determine whether her contralateral expander will need to be deflated some in order to facilitate radiation treatments. I also think would be beneficial for Dr. Abbey Chatters to draw the area at risk on her skin and we could send a picture of that with her to [place]. This in combination with the mammogram and MRI will give a good estimate of where she remains at risk for local recurrence. We discussed the process of simulation and the placement tattoos. We discussed 6 weeks of treatment as an outpatient. We discussed skin redness and fatigue as the most common side effects. We discussed a symptomatic lung damage. We discussed the decreased cosmetic outcome and implant failure as possible  side effects of treatment. I will get in touch with Dr. Abbey Chatters and Dr. Odis Luster as well as the radiation oncologist in [place] and contact her. I spent 60 minutes  face to face  with the patient and more than 50% of that time was spent in counseling and/or coordination of care.   ------------------------------------------------  Lurline Hare, MD

## 2013-06-15 NOTE — Progress Notes (Signed)
Please see the Nurse Progress Note in the MD Initial Consult Encounter for this patient. 

## 2013-06-16 ENCOUNTER — Telehealth: Payer: Self-pay | Admitting: *Deleted

## 2013-06-16 ENCOUNTER — Telehealth: Payer: Self-pay | Admitting: Radiation Oncology

## 2013-06-16 DIAGNOSIS — C50911 Malignant neoplasm of unspecified site of right female breast: Secondary | ICD-10-CM

## 2013-06-16 MED ORDER — VENLAFAXINE HCL ER 37.5 MG PO CP24: 75.0000 mg | ORAL_CAPSULE | Freq: Every day | ORAL | Status: DC

## 2013-06-16 NOTE — Telephone Encounter (Signed)
Per SW, faxed chart to [facility], [phone].  Received confirmation.

## 2013-06-16 NOTE — Telephone Encounter (Signed)
This RN received request to contact pt's daughter due to concern with noted depressive symptoms in pt and inquiring per her mother's request to increase the effexor to 75mg  a day.  Per discussion with pt's daughter, Tracey [name], with mother present- states overall mother is having increased crying spells and frustration with decision making- feeling overwhelmed at times. [name] stated with mother present that patient was not suicideal.  Plan per discussion is to increase her dose of effexor XR to 75mg  daily. Pt is scheduled for follow up with MD on 11/12 and above can be further discussed at that time.  Pt and mother understand increase dose may take up to a week for notable improvement and to call if symptoms worsen.

## 2013-06-17 ENCOUNTER — Other Ambulatory Visit: Payer: Self-pay | Admitting: Oncology

## 2013-06-19 ENCOUNTER — Ambulatory Visit: Payer: Medicare Other | Attending: Plastic Surgery | Admitting: Physical Therapy

## 2013-06-19 DIAGNOSIS — R293 Abnormal posture: Secondary | ICD-10-CM | POA: Diagnosis not present

## 2013-06-19 DIAGNOSIS — IMO0001 Reserved for inherently not codable concepts without codable children: Secondary | ICD-10-CM | POA: Insufficient documentation

## 2013-06-19 DIAGNOSIS — C50919 Malignant neoplasm of unspecified site of unspecified female breast: Secondary | ICD-10-CM | POA: Insufficient documentation

## 2013-06-20 DIAGNOSIS — C50919 Malignant neoplasm of unspecified site of unspecified female breast: Secondary | ICD-10-CM | POA: Diagnosis not present

## 2013-06-21 ENCOUNTER — Encounter: Payer: Self-pay | Admitting: *Deleted

## 2013-06-21 NOTE — Progress Notes (Signed)
Received Oncotype Dx results of 20.  Gave copy to MD. Emailed results to MD.  Rochele Pages copy to Med Rec to scan.

## 2013-06-23 ENCOUNTER — Telehealth: Payer: Self-pay | Admitting: *Deleted

## 2013-06-23 NOTE — Telephone Encounter (Signed)
This RN called pt per MD review of Oncotype with noted result of 13%- which with AI lowers more.  Informed pt and her husband.  Questions answered and pt is scheduled for MD appt next Wednesday.

## 2013-06-26 ENCOUNTER — Ambulatory Visit: Payer: Medicare Other | Admitting: Physical Therapy

## 2013-06-28 ENCOUNTER — Encounter (INDEPENDENT_AMBULATORY_CARE_PROVIDER_SITE_OTHER): Payer: Self-pay | Admitting: General Surgery

## 2013-06-28 ENCOUNTER — Ambulatory Visit (INDEPENDENT_AMBULATORY_CARE_PROVIDER_SITE_OTHER): Payer: Medicare Other | Admitting: General Surgery

## 2013-06-28 ENCOUNTER — Other Ambulatory Visit (HOSPITAL_BASED_OUTPATIENT_CLINIC_OR_DEPARTMENT_OTHER): Payer: Medicare Other | Admitting: Lab

## 2013-06-28 ENCOUNTER — Ambulatory Visit (HOSPITAL_BASED_OUTPATIENT_CLINIC_OR_DEPARTMENT_OTHER): Payer: Medicare Other | Admitting: Oncology

## 2013-06-28 VITALS — BP 122/80 | HR 74 | Temp 97.8°F | Resp 16 | Ht 63.0 in | Wt 148.8 lb

## 2013-06-28 VITALS — BP 132/80 | HR 66 | Temp 98.2°F | Resp 18 | Ht 63.0 in | Wt 149.1 lb

## 2013-06-28 DIAGNOSIS — Z4889 Encounter for other specified surgical aftercare: Secondary | ICD-10-CM

## 2013-06-28 DIAGNOSIS — C50411 Malignant neoplasm of upper-outer quadrant of right female breast: Secondary | ICD-10-CM | POA: Insufficient documentation

## 2013-06-28 DIAGNOSIS — Z170 Estrogen receptor positive status [ER+]: Secondary | ICD-10-CM

## 2013-06-28 DIAGNOSIS — C50111 Malignant neoplasm of central portion of right female breast: Secondary | ICD-10-CM

## 2013-06-28 DIAGNOSIS — M8580 Other specified disorders of bone density and structure, unspecified site: Secondary | ICD-10-CM

## 2013-06-28 DIAGNOSIS — C50419 Malignant neoplasm of upper-outer quadrant of unspecified female breast: Secondary | ICD-10-CM | POA: Diagnosis not present

## 2013-06-28 LAB — CBC WITH DIFFERENTIAL/PLATELET
BASO%: 0.6 % (ref 0.0–2.0)
EOS%: 8 % — ABNORMAL HIGH (ref 0.0–7.0)
Eosinophils Absolute: 0.4 10*3/uL (ref 0.0–0.5)
LYMPH%: 35.3 % (ref 14.0–49.7)
MCH: 30.1 pg (ref 25.1–34.0)
MCHC: 33.1 g/dL (ref 31.5–36.0)
MCV: 90.8 fL (ref 79.5–101.0)
MONO#: 0.3 10*3/uL (ref 0.1–0.9)
MONO%: 6.9 % (ref 0.0–14.0)
NEUT#: 2.5 10*3/uL (ref 1.5–6.5)
Platelets: 173 10*3/uL (ref 145–400)
RBC: 4.48 10*6/uL (ref 3.70–5.45)
RDW: 14.7 % — ABNORMAL HIGH (ref 11.2–14.5)

## 2013-06-28 LAB — COMPREHENSIVE METABOLIC PANEL (CC13)
ALT: 18 U/L (ref 0–55)
AST: 20 U/L (ref 5–34)
Alkaline Phosphatase: 87 U/L (ref 40–150)
BUN: 16.8 mg/dL (ref 7.0–26.0)
CO2: 22 mEq/L (ref 22–29)
Calcium: 10 mg/dL (ref 8.4–10.4)
Glucose: 91 mg/dl (ref 70–140)
Potassium: 4.2 mEq/L (ref 3.5–5.1)
Sodium: 139 mEq/L (ref 136–145)
Total Bilirubin: 0.49 mg/dL (ref 0.20–1.20)
Total Protein: 7.2 g/dL (ref 6.4–8.3)

## 2013-06-28 NOTE — Telephone Encounter (Signed)
appts made and printed...td 

## 2013-06-28 NOTE — Progress Notes (Signed)
Procedure:  Bilateral mastectomies, right axillary sentinel lymph node biopsy, reconstruction with tissue expanders.  Date:  05/23/2013  Pathology:  Invasive right breast cancer extending to one of the anterior tissue margins in the upper flap region, precise site unable to be determined. Sentinel lymph nodes negative for tumor. Estrogen receptor positive.  History:  She is here for her second postoperative visit.  She's undergone an another expansion of her tissue expanders. She has seen radiation oncology. She is going to [place] and is going to have her radiation treatments there. Exam: General- Is in NAD. Chest wall incisions are clean and intact. Right axillary incision is clean and intact.  Assessment: Invasive right breast cancer with close or positive superior skin flap margin. Wounds are healing well.  Plan:  Radiation should be directed to the superior skin flap above the incision. Since she's going to [place] for the winter, I will see her back in April 2015.

## 2013-06-28 NOTE — Progress Notes (Signed)
ID: Tracey [name] OB: 12/25/1945  MR#: [id number]  WGN#:[id number]  PCP: No PCP Per Patient GYN:   SU: Avel Peace OTHER MD: Luanna Cole (FAX [phone])  PCP: No PCP Per Patient GYN: SU: Avel Peace OTHER MD: Etter Sjogren    HISTORY OF PRESENT ILLNESS: [name] had routine screening mammography July of 2014 showing a suspicious mass in her right breast. Additional views confirmed a mass in the upper outer quadrant of the right breast, which by ultrasound measured 4 mm. Biopsy of this mass was obtained in [place], at [facility]. 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  The patient's subsequent history is as detailed below  INTERVAL HISTORY: Tracey [name] returns today for followup of her breast cancer accompanied by her husband Tracey [name] and their daughter Tracey [name]. Since her last visit here she had bilateral mastectomies and right sentinel lymph node sampling. This showed ([phone]) no malignancy in the left breast. On the right she had an 8 mm invasive ductal carcinoma, grade 1, with all 5 sentinel lymph nodes clear. Unfortunately in the anterior soft tissue resection margin there was a microscopic focus at the margin altered by cautery artifact. This was felt to be suspicious for local margin involvement. The patient underwent expander placement at the time of her surgery.  REVIEW OF SYSTEMS: Tracey [name] had significant pain after the surgery but that has now cleared and she has been off pain medication at least a week. She has not had any bleeding or fever problems. There has not been any dehiscence. She is very dissatisfied with the way the reconstruction looks at present, because it is not symmetrical. She has discussed this with Dr. Odis Luster. She sleeps poorly. She has mild sinus problems. She has a history of palpitations. She has  some arthritis particularly involving the hands. None of these symptoms are more frequent or intense than before. None of them are new. She does have more hot flashes than before. The Effexor has helped to "quiet those down". Otherwise a detailed review of systems today was noncontributory  PAST MEDICAL HISTORY: Past Medical History  Diagnosis Date  . Arthritis   . Gout     takes Allopurinol daily and Colchicine daily prn;last attack 65yrs ago  . Tachycardia     takes Metoprolol daily  . Hyperlipidemia     takes Pravastatin daily  . Gout   . Breast cancer 2014    ER+/PR+/HEr2-,   . Colon polyps     2 polyps by report  . Rhinitis     uses Flonase prn  . Insomnia     takes Ambien nightly prn  . Anxiety     takes Ativan daily prn  . Peripheral edema     takes Furosemide daily prn  . PONV (postoperative nausea and vomiting)   . History of bronchitis     last time at least 26yrs ago  . Osteoarthritis   . Bruises easily   . GERD (gastroesophageal reflux disease)     occasionally takes Nexium   . Diverticulosis   . History of bladder infections     many yrs ago  . Depression     takes Effexor daily  . Allergy     PAST SURGICAL HISTORY: Past Surgical History  Procedure Laterality Date  . Appendectomy    . Gallbladder surgery    . Cholecystectomy    .  Cosmetic surgery    . Tonsillectomy    . Abdominal hysterectomy    . Cardiac catheterization  1999  . Colonoscopy    . Bilateral cataract surgery    . Urinary tightening      d/t urinary incontinence  . Total mastectomy Bilateral 05/23/2013    WITH RECONSTRUCTION  . Total mastectomy Bilateral 05/23/2013    Procedure: TOTAL MASTECTOMY;  Surgeon: Adolph Pollack, MD;  Location: [facility] OR;  Service: General;  Laterality: Bilateral;  . Axillary sentinel node biopsy Right 05/23/2013    Procedure: AXILLARY SENTINEL NODE BIOPSY;  Surgeon: Adolph Pollack, MD;  Location: [facility] OR;  Service: General;  Laterality: Right;  nuc med  injection 7:00  . Breast reconstruction with placement of tissue expander and flex hd (acellular hydrated dermis) Bilateral 05/23/2013    Procedure: BILATERAL BREAST RECONSTRUCTION WITH PLACEMENT OF TISSUE EXPANDER AND FLEX HD;  Surgeon: Etter Sjogren, MD;  Location: [facility] OR;  Service: Plastics;  Laterality: Bilateral;    FAMILY HISTORY Family History  Problem Relation Age of Onset  . Breast cancer Mother     possible inflammatory breast cancer  . Heart attack Father   . Heart disease Father   . ALS Sister   . Breast cancer Maternal Grandmother 37  . Heart attack Maternal Grandfather   . Heart attack Paternal Grandfather   . Breast cancer Maternal Aunt     dx in her 35s  . Breast cancer Other     maternal great grandmother; dx in her 76s   the patient's father died from heart disease at the age of 78. The patient's mother died from breast cancer at the age of 40. It is not clear when she was diagnosed since she "dictated and". The patient had no brothers. One sister died from ALS at the age of 69 in addition, one of the patient's mother is sisters was diagnosed with breast cancer in her 29s. The patient's mother is mother, was also diagnosed with breast cancer, at the age of 64. The patient's daughter, Tracey [name], has been tested for the BRCA genes and was negative.  GYNECOLOGIC HISTORY:  Menarche age 18, first live birth age 42, the patient is GX P2. She underwent total abdominal hysterectomy and bilateral salpingo-oophorectomy at the age of 6 for endometriosis. She took hormone replacement until August of 2014.  SOCIAL HISTORY:  Tracey [name] recently moved to [facility] and is planning to make this area her "home-base", although they also have a home in [place] where they go for the winter to ski. 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 They are now enjoying their retirement. Daughter Tracey [name] lives in [place]. A  younger daughter Tracey [name] is a homemaker in [place]. The patient has 3 grandchildren. She is a Investment banker, operational.    ADVANCED DIRECTIVES: In place   HEALTH MAINTENANCE: History  Substance Use Topics  . Smoking status: Former Smoker -- 1.00 packs/day for 20 years    Start date: 06/09/1992  . Smokeless tobacco: Never Used     Comment: quit at age 65  . Alcohol Use: 3.0 oz/week    5 Glasses of wine per week     Comment: nightly     Colonoscopy: 2010  PAP: August 2014  Bone density: 2010, normal per patient  Lipid panel:  Allergies  Allergen Reactions  . Amoxicillin Nausea And Vomiting  . Ivp Dye [Iodinated Diagnostic Agents] Hives  .  Sulfa Antibiotics Swelling    Current Outpatient Prescriptions  Medication Sig Dispense Refill  . allopurinol (ZYLOPRIM) 100 MG tablet Take 100 mg by mouth daily.      Marland Kitchen anastrozole (ARIMIDEX) 1 MG tablet Take 1 tablet (1 mg total) by mouth daily.  30 tablet  5  . calcium carbonate 200 MG capsule Take 250 mg by mouth 2 (two) times daily with a meal.      . Cyanocobalamin (VITAMIN B-12 IJ) Inject as directed every 30 (thirty) days.      Marland Kitchen docusate sodium 100 MG CAPS Take 100 mg by mouth daily.  10 capsule  0  . fluticasone (FLONASE) 50 MCG/ACT nasal spray Place 1 spray into the nose daily as needed for rhinitis.       . furosemide (LASIX) 40 MG tablet Take 40 mg by mouth daily as needed for fluid.       Marland Kitchen LORazepam (ATIVAN) 0.5 MG tablet Take 0.5 mg by mouth at bedtime as needed for anxiety (or sleep).      . methocarbamol (ROBAXIN) 500 MG tablet Take 1 tablet (500 mg total) by mouth 4 (four) times daily -  before meals and at bedtime.  30 tablet  1  . metoprolol succinate (TOPROL-XL) 25 MG 24 hr tablet Take 25 mg by mouth daily.      . pravastatin (PRAVACHOL) 40 MG tablet Take 40 mg by mouth daily.      Marland Kitchen venlafaxine XR (EFFEXOR-XR) 37.5 MG 24 hr capsule Take 2 capsules (75 mg total) by mouth daily.  60 capsule  4   No current  facility-administered medications for this visit.    OBJECTIVE: Middle-aged white woman in no acute distress Filed Vitals:   06/28/13 1040  BP: 132/80  Pulse: 66  Temp: 98.2 F (36.8 C)  Resp: 18     Body mass index is 26.42 kg/(m^2).    ECOG FS:1 - Symptomatic but completely ambulatory Filed Weights   06/28/13 1040  Weight: 149 lb 2 oz (67.643 kg)   Sclerae unicteric, status post remote cataract surgery Oropharynx clear, good dentition No cervical or supraclavicular adenopathy Lungs clear to auscultation, good excursion bilaterally Heart regular rate and rhythm Abd soft, nontender, positive bowel sounds MSK mild kyphosis but no focal spinal tenderness, no upper extremity lymphedema Neuro: non-focal, well-oriented, appropriate affect Breasts: Status post bilateral mastectomies with expander placement. The left breast area is considerably more superior than the right and very different in shape. The incisions are healing nicely. Both axillae are benign  LAB RESULTS:   Lab Results  Component Value Date   WBC 5.0 06/28/2013   NEUTROABS 2.5 06/28/2013   HGB 13.5 06/28/2013   HCT 40.7 06/28/2013   MCV 90.8 06/28/2013   PLT 173 06/28/2013      Chemistry      Component Value Date/Time   NA 139 06/28/2013 1012   NA 137 05/16/2013 1540   K 4.2 06/28/2013 1012   K 4.3 05/16/2013 1540   CL 101 05/16/2013 1540   CO2 22 06/28/2013 1012   CO2 24 05/16/2013 1540   BUN 16.8 06/28/2013 1012   BUN 21 05/16/2013 1540   CREATININE 0.9 06/28/2013 1012   CREATININE 1.03 05/16/2013 1540      Component Value Date/Time   CALCIUM 10.0 06/28/2013 1012   CALCIUM 9.9 05/16/2013 1540   ALKPHOS 87 06/28/2013 1012   ALKPHOS 71 05/16/2013 1540   AST 20 06/28/2013 1012   AST 23 05/16/2013 1540  ALT 18 06/28/2013 1012   ALT 22 05/16/2013 1540   BILITOT 0.49 06/28/2013 1012   BILITOT 0.4 05/16/2013 1540       STUDIES: No results found.  ASSESSMENT: 67 y.o. BRCA 1 & 2 negative [place]  woman   (1)  status post right breast upper outer quadrant biopsy 03/24/2013 for a pT1b cN0, stage IA invasive ductal carcinoma, grade 1, strongly estrogen and progesterone receptor positive, HER-2 negative  (2) status post bilateral mastectomies 05/23/2013 showing:  (a) on the right, a pT1b pN0, stage invasive ductal carcinoma, grade 1, again HER-2 negative  (b) on the left, no evidence of malignancy  (3) Oncotype DX score of 20 predicted a rate of distant recurrence within 10 years of 13% if the patient's only systemic treatment was tamoxifen for 5 years.  (4) a positive margin will require limited radiation, which is being planned in [place]  (5) she is undergoing bilateral expander stretching with a view to definitive implant placement sometime January 20 15th.  (6)  started on anastrozole, mid September 2014, prior to definitive surgery; continued postop    PLAN: From a systemic point of view, Deeanna is doing very well, and is tolerating the anastrozole much better with the help of venlafaxine. The plan is to continue anastrozole for 5 years counting from the time of her surgery. She has not had a bone density for several years, and we will obtain one prior to her return visit here.  Currently she is very dissatisfied with the way her reconstructed breasts are looking, and she is discussing this further with Dr. Odis Luster. She understands and wants to definitive implants were placed, thinks may look considerably better.  She is going to be moving to [place] in the late winter and plans to receive her radiation treatments there. This is being arranged through Dr. Michell Heinrich.  Audria will return to see me in 6 months. She knows to call for any problems that may develop before next visit here.   Lowella Dell, MD   06/28/2013 11:19 AM

## 2013-06-28 NOTE — Patient Instructions (Signed)
Call if you find a new hard nodule in the chest wall or under your arm.

## 2013-06-29 ENCOUNTER — Encounter (HOSPITAL_COMMUNITY): Payer: Self-pay

## 2013-06-29 ENCOUNTER — Ambulatory Visit: Payer: Medicare Other | Admitting: Physical Therapy

## 2013-07-03 ENCOUNTER — Ambulatory Visit: Payer: Medicare Other | Admitting: Physical Therapy

## 2013-07-05 ENCOUNTER — Ambulatory Visit: Payer: Medicare Other

## 2013-07-06 NOTE — Addendum Note (Signed)
Addended by: Etter Sjogren on: 07/06/2013 08:21 AM   Modules accepted: Orders

## 2013-07-10 ENCOUNTER — Ambulatory Visit: Payer: Medicare Other | Admitting: Physical Therapy

## 2013-07-12 ENCOUNTER — Encounter: Payer: Self-pay | Admitting: Radiation Oncology

## 2013-07-12 NOTE — Progress Notes (Signed)
Location of Breast Cancer: Invasive Ductal Carcinoma of the Right Upper outer Quadrant  Histology per Pathology Report:  Diagnosis 04/03/13 Consult Slide , Right breast - INVASIVE DUCTAL CARCINOMA  05/23/13 1. Breast, simple mastectomy, Left - FIBROCYSTIC CHANGES. - NO EVIDENCE OF MALIGNANCY. 2. Lymph node, sentinel, biopsy, Right axillary #1 - ONE BENIGN LYMPH NODE (0/1). 3. Lymph node, sentinel, biopsy, Right axillary #1 - ONE BENIGN LYMPH NODE (0/1). 4. Lymph node, sentinel, biopsy, Right axillary #2 - ONE BENIGN LYMPH NODE (0/1). 5. Lymph node, sentinel, biopsy, Right axillary #3 - ONE BENIGN LYMPH NODE (0/1). 6. Lymph node, sentinel, biopsy, Right axillary #4 - ONE BENIGN LYMPH NODE (0/1). 7. Breast, simple mastectomy, Right - INVASIVE DUCTAL CARCINOMA, 0.8 CM. - ANTERIOR SOFT TISSUE MARGINS SUSPICIOUS FOR INVOLVEMENT. - DEEP MARGIN FREE OF TUMOR. Microscopic Comment  Receptor Status: ER(Pos), PR (Pos), Her2-neu (No Amplification), Ki-67 (11%), Oncotype Recurrence sore (20)  Did patient present with symptoms (if so, please note symptoms) or was this found on screening mammography?:   "routine screening mammography July of 2014 showing a suspicious mass in her right breast. Additional views confirmed a mass in the upper outer quadrant of the right breast, which by ultrasound measured 4 mm. Biopsy of this mass was obtained in [place], at [facility]. 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  No  Pacemaker/ICD? no  Possible current pregnancy? No  Is the patient on methotrexate? no  Current Complaints / other details:  Dr Odis Luster did final tissue expander on right chest wall side and deflated left chest wall expander on 07/19/13.

## 2013-07-17 ENCOUNTER — Ambulatory Visit: Payer: Medicare Other | Attending: Plastic Surgery | Admitting: Physical Therapy

## 2013-07-17 DIAGNOSIS — C50919 Malignant neoplasm of unspecified site of unspecified female breast: Secondary | ICD-10-CM | POA: Diagnosis not present

## 2013-07-17 DIAGNOSIS — IMO0001 Reserved for inherently not codable concepts without codable children: Secondary | ICD-10-CM | POA: Diagnosis not present

## 2013-07-17 DIAGNOSIS — R293 Abnormal posture: Secondary | ICD-10-CM | POA: Diagnosis not present

## 2013-07-20 ENCOUNTER — Encounter: Payer: Self-pay | Admitting: Radiation Oncology

## 2013-07-20 ENCOUNTER — Ambulatory Visit
Admission: RE | Admit: 2013-07-20 | Discharge: 2013-07-20 | Disposition: A | Discharge status: Home / Self Care | Payer: Medicare Other | Source: Ambulatory Visit | Attending: Radiation Oncology | Admitting: Radiation Oncology

## 2013-07-20 ENCOUNTER — Ambulatory Visit: Payer: Medicare Other | Admitting: Physical Therapy

## 2013-07-20 VITALS — BP 127/75 | HR 76 | Temp 98.1°F | Resp 20 | Wt 148.0 lb

## 2013-07-20 DIAGNOSIS — C50919 Malignant neoplasm of unspecified site of unspecified female breast: Secondary | ICD-10-CM | POA: Diagnosis not present

## 2013-07-20 DIAGNOSIS — L259 Unspecified contact dermatitis, unspecified cause: Secondary | ICD-10-CM | POA: Insufficient documentation

## 2013-07-20 DIAGNOSIS — Z510 Encounter for antineoplastic radiation therapy: Secondary | ICD-10-CM | POA: Insufficient documentation

## 2013-07-20 DIAGNOSIS — C50411 Malignant neoplasm of upper-outer quadrant of right female breast: Secondary | ICD-10-CM

## 2013-07-20 DIAGNOSIS — C50119 Malignant neoplasm of central portion of unspecified female breast: Secondary | ICD-10-CM | POA: Diagnosis not present

## 2013-07-20 DIAGNOSIS — Z9010 Acquired absence of unspecified breast and nipple: Secondary | ICD-10-CM | POA: Insufficient documentation

## 2013-07-20 DIAGNOSIS — C50419 Malignant neoplasm of upper-outer quadrant of unspecified female breast: Secondary | ICD-10-CM | POA: Diagnosis not present

## 2013-07-20 NOTE — Progress Notes (Signed)
Name: Tracey [name]   MRN: [id number]  Date:  07/20/2013  DOB: 04/27/1946  Status:outpatient    DIAGNOSIS: Breast cancer.  CONSENT VERIFIED: yes   SET UP: Patient is setup supine   IMMOBILIZATION:  The following immobilization was used:Custom Moldable Pillow, breast board.   NARRATIVE: [name] was brought to the CT Simulation planning suite.  Identity was confirmed.  All relevant records and images related to the planned course of therapy were reviewed.  Then, the patient was positioned in a stable reproducible clinical set-up for radiation therapy.  Wires were placed to delineate the clinical are at risk.  After discussion with Dr. Abbey Chatters we determined that the area most at risk for recurrence is superior to her scar. A wire was placed on the scar.  CT images were obtained.  An isocenter was placed. Skin markings were placed.  The CT images were loaded into the planning software where the target and avoidance structures were contoured.  The radiation prescription was entered and confirmed. The patient was discharged in stable condition and tolerated simulation well.    TREATMENT PLANNING NOTE:  Treatment planning then occurred. I have requested : MLC's, isodose plan, basic dose calculation  I personally designed and supervised the construction of 3 medically necessary complex treatment devices for the protection of critical normal structures including the lungs and contralateral breast as well as the immobilization device which is necessary for set up certainty.

## 2013-07-20 NOTE — Addendum Note (Signed)
Encounter addended by: Delynn Flavin, RN on: 07/20/2013 12:05 PM<BR>     Documentation filed: Charges VN

## 2013-07-20 NOTE — Progress Notes (Signed)
   Department of Radiation Oncology  Phone:  [phone] Fax:        [phone]   Name: Tracey [name] MRN: [id number]  DOB: [birth year]  Date: 07/20/2013  Follow Up Visit Note  Diagnosis: T1bN0 Invasive Ductal Carcinoma of the right breast  Interval History: Tracey [name] presents today for routine followup.  And fully expanded. She saw Dr. Odis Luster yesterday who deflated her left side and fully expanded her right side. She has decided to pursue radiation here in [place]. She is accompanied by her daughter.  Allergies:  Allergies  Allergen Reactions  . Amoxicillin Nausea And Vomiting  . Ivp Dye [Iodinated Diagnostic Agents] Hives  . Sulfa Antibiotics Swelling    Medications:  Current Outpatient Prescriptions  Medication Sig Dispense Refill  . allopurinol (ZYLOPRIM) 100 MG tablet Take 100 mg by mouth daily.      Marland Kitchen anastrozole (ARIMIDEX) 1 MG tablet Take 1 tablet (1 mg total) by mouth daily.  30 tablet  5  . calcium carbonate 200 MG capsule Take 250 mg by mouth 2 (two) times daily with a meal.      . Cyanocobalamin (VITAMIN B-12 IJ) Inject as directed every 30 (thirty) days.      Marland Kitchen docusate sodium 100 MG CAPS Take 100 mg by mouth daily.  10 capsule  0  . fluticasone (FLONASE) 50 MCG/ACT nasal spray Place 1 spray into the nose daily as needed for rhinitis.       . furosemide (LASIX) 40 MG tablet Take 40 mg by mouth daily as needed for fluid.       Marland Kitchen LORazepam (ATIVAN) 0.5 MG tablet Take 0.5 mg by mouth at bedtime as needed for anxiety (or sleep).      . methocarbamol (ROBAXIN) 500 MG tablet Take 1 tablet (500 mg total) by mouth 4 (four) times daily -  before meals and at bedtime.  30 tablet  1  . metoprolol succinate (TOPROL-XL) 25 MG 24 hr tablet Take 25 mg by mouth daily.      . pravastatin (PRAVACHOL) 40 MG tablet Take 40 mg by mouth daily.      Marland Kitchen venlafaxine XR (EFFEXOR-XR) 37.5 MG 24 hr capsule Take 2 capsules (75 mg total) by mouth daily.  60 capsule  4   No current  facility-administered medications for this encounter.    Physical Exam:  Filed Vitals:   07/20/13 0804  BP: 127/75  Pulse: 76  Temp: 98.1 F (36.7 C)  Resp: 20   she has well-healed bilateral mastectomy scars with an expander in place on the right. No palpable or visible signs of tumor recurrence.  IMPRESSION: Tracey [name] is a 67 y.o. female status post bilateral mastectomies with a unknown/close/possibly positive margin status on the right.  PLAN:  I spoke with Erskine Squibb and her daughter again today about radiation. She would like to proceed forward. We discussed the process of simulation the placement tattoos. We discussed 5 and half weeks of treatment as an outpatient. We discussed skin redness and fatigue as possible side effects. We discussed the use of 3-D planning to decrease lung dose. She has signed informed consent and agree to proceed forward. I was able to schedule her for simulation later today.    Lurline Hare, MD

## 2013-07-21 DIAGNOSIS — C50119 Malignant neoplasm of central portion of unspecified female breast: Secondary | ICD-10-CM | POA: Diagnosis not present

## 2013-07-24 ENCOUNTER — Ambulatory Visit: Payer: Medicare Other

## 2013-07-26 ENCOUNTER — Ambulatory Visit
Admission: RE | Admit: 2013-07-26 | Discharge: 2013-07-26 | Disposition: A | Discharge status: Home / Self Care | Payer: Medicare Other | Source: Ambulatory Visit | Attending: Radiation Oncology | Admitting: Radiation Oncology

## 2013-07-26 DIAGNOSIS — C50411 Malignant neoplasm of upper-outer quadrant of right female breast: Secondary | ICD-10-CM

## 2013-07-26 DIAGNOSIS — C50119 Malignant neoplasm of central portion of unspecified female breast: Secondary | ICD-10-CM | POA: Diagnosis not present

## 2013-07-26 NOTE — Progress Notes (Signed)
  Radiation Oncology         [phone] ________________________________  Name: Tracey [name] MRN: [id number]  Date: 07/26/2013  DOB: [birth year]  Simulation Verification Note  Status: outpatient  NARRATIVE: The patient was brought to the treatment unit and placed in the planned treatment position. The clinical setup was verified. Then port films were obtained and uploaded to the radiation oncology medical record software.  The treatment beams were carefully compared against the planned radiation fields. The position location and shape of the radiation fields was reviewed. The targeted volume of tissue appears appropriately covered by the radiation beams. Organs at risk appear to be excluded as planned.  Based on my personal review, I approved the simulation verification. The patient's treatment will proceed as planned.  ------------------------------------------------  Lurline Hare, MD

## 2013-07-27 ENCOUNTER — Ambulatory Visit: Payer: Medicare Other | Admitting: Physical Therapy

## 2013-07-27 ENCOUNTER — Ambulatory Visit
Admission: RE | Admit: 2013-07-27 | Discharge: 2013-07-27 | Disposition: A | Discharge status: Home / Self Care | Payer: Medicare Other | Source: Ambulatory Visit | Attending: Radiation Oncology | Admitting: Radiation Oncology

## 2013-07-27 DIAGNOSIS — C50119 Malignant neoplasm of central portion of unspecified female breast: Secondary | ICD-10-CM | POA: Diagnosis not present

## 2013-07-28 ENCOUNTER — Ambulatory Visit
Admission: RE | Admit: 2013-07-28 | Discharge: 2013-07-28 | Disposition: A | Discharge status: Home / Self Care | Payer: Medicare Other | Source: Ambulatory Visit | Attending: Radiation Oncology | Admitting: Radiation Oncology

## 2013-07-31 ENCOUNTER — Ambulatory Visit
Admission: RE | Admit: 2013-07-31 | Discharge: 2013-07-31 | Disposition: A | Discharge status: Home / Self Care | Payer: Medicare Other | Source: Ambulatory Visit | Attending: Radiation Oncology | Admitting: Radiation Oncology

## 2013-07-31 ENCOUNTER — Ambulatory Visit
Admission: RE | Admit: 2013-07-31 | Discharge: 2013-07-31 | Disposition: A | Discharge status: Home / Self Care | Payer: Medicare Other | Source: Ambulatory Visit | Attending: Oncology | Admitting: Oncology

## 2013-07-31 ENCOUNTER — Ambulatory Visit: Payer: Medicare Other | Admitting: Physical Therapy

## 2013-07-31 DIAGNOSIS — C50411 Malignant neoplasm of upper-outer quadrant of right female breast: Secondary | ICD-10-CM

## 2013-07-31 DIAGNOSIS — Z780 Asymptomatic menopausal state: Secondary | ICD-10-CM | POA: Diagnosis not present

## 2013-07-31 DIAGNOSIS — M8580 Other specified disorders of bone density and structure, unspecified site: Secondary | ICD-10-CM

## 2013-08-01 ENCOUNTER — Ambulatory Visit
Admission: RE | Admit: 2013-08-01 | Discharge: 2013-08-01 | Disposition: A | Discharge status: Home / Self Care | Payer: Medicare Other | Source: Ambulatory Visit | Attending: Radiation Oncology | Admitting: Radiation Oncology

## 2013-08-01 ENCOUNTER — Encounter: Payer: Self-pay | Admitting: Radiation Oncology

## 2013-08-01 VITALS — BP 143/80 | HR 84 | Temp 97.9°F | Ht 63.0 in | Wt 149.3 lb

## 2013-08-01 DIAGNOSIS — C50411 Malignant neoplasm of upper-outer quadrant of right female breast: Secondary | ICD-10-CM

## 2013-08-01 MED ORDER — RADIAPLEXRX EX GEL
Freq: Once | CUTANEOUS | Status: AC
  Administered 2013-08-01: 14:00:00 via TOPICAL

## 2013-08-01 MED ORDER — ALRA NON-METALLIC DEODORANT (RAD-ONC)
1.0000 "application " | Freq: Once | TOPICAL | Status: AC
  Administered 2013-08-01: 1 via TOPICAL

## 2013-08-01 NOTE — Addendum Note (Signed)
Encounter addended by: Delynn Flavin, RN on: 08/01/2013  2:35 PM<BR>     Documentation filed: Inpatient MAR

## 2013-08-01 NOTE — Addendum Note (Signed)
Encounter addended by: Delynn Flavin, RN on: 08/01/2013  2:43 PM<BR>     Documentation filed: Inpatient Patient Education, Inpatient MAR, Notes Section

## 2013-08-01 NOTE — Progress Notes (Addendum)
Tracey [name] has received 4 fractions to her right breast.  She has not voiced concern, but admits to being somewhat nervous and having difficulty remembering information.  She reported rash in the upper inner chest area, but without any itching.  Education today with Ms. Din and her daughter regarding side-effect management to include, skin care, fatigue and pain. Given Radiaplex Gel with instructions to apply BID, after treatment and at bedtime.  Encouraged her to inform Dr. Michell Heinrich and or the  nurses of any concerns, inclusive of pain during the treatment phase.  Given the Radiation Therapy and You booklet with appropriate pages marked.  Informed that Dr. Michell Heinrich sees her patients every Tuesday following her treatment, but if this changes she will be notified in advance.

## 2013-08-01 NOTE — Addendum Note (Signed)
Encounter addended by: Delynn Flavin, RN on: 08/01/2013  2:38 PM<BR>     Documentation filed: Notes Section

## 2013-08-02 ENCOUNTER — Ambulatory Visit
Admission: RE | Admit: 2013-08-02 | Discharge: 2013-08-02 | Disposition: A | Discharge status: Home / Self Care | Payer: Medicare Other | Source: Ambulatory Visit | Attending: Radiation Oncology | Admitting: Radiation Oncology

## 2013-08-03 ENCOUNTER — Ambulatory Visit
Admission: RE | Admit: 2013-08-03 | Discharge: 2013-08-03 | Disposition: A | Discharge status: Home / Self Care | Payer: Medicare Other | Source: Ambulatory Visit | Attending: Radiation Oncology | Admitting: Radiation Oncology

## 2013-08-03 ENCOUNTER — Ambulatory Visit: Payer: Medicare Other | Admitting: Physical Therapy

## 2013-08-03 DIAGNOSIS — C50119 Malignant neoplasm of central portion of unspecified female breast: Secondary | ICD-10-CM | POA: Diagnosis not present

## 2013-08-04 ENCOUNTER — Ambulatory Visit
Admission: RE | Admit: 2013-08-04 | Discharge: 2013-08-04 | Disposition: A | Discharge status: Home / Self Care | Payer: Medicare Other | Source: Ambulatory Visit | Attending: Radiation Oncology | Admitting: Radiation Oncology

## 2013-08-04 NOTE — Addendum Note (Signed)
Encounter addended by: Delynn Flavin, RN on: 08/04/2013  3:12 PM<BR>     Documentation filed: Charges VN

## 2013-08-07 ENCOUNTER — Ambulatory Visit
Admission: RE | Admit: 2013-08-07 | Discharge: 2013-08-07 | Disposition: A | Discharge status: Home / Self Care | Payer: Medicare Other | Source: Ambulatory Visit | Attending: Radiation Oncology | Admitting: Radiation Oncology

## 2013-08-07 ENCOUNTER — Ambulatory Visit: Payer: Medicare Other | Admitting: Physical Therapy

## 2013-08-08 ENCOUNTER — Ambulatory Visit
Admission: RE | Admit: 2013-08-08 | Discharge: 2013-08-08 | Disposition: A | Discharge status: Home / Self Care | Payer: Medicare Other | Source: Ambulatory Visit | Attending: Radiation Oncology | Admitting: Radiation Oncology

## 2013-08-09 ENCOUNTER — Ambulatory Visit
Admission: RE | Admit: 2013-08-09 | Discharge: 2013-08-09 | Disposition: A | Discharge status: Home / Self Care | Payer: Medicare Other | Source: Ambulatory Visit | Attending: Radiation Oncology | Admitting: Radiation Oncology

## 2013-08-09 ENCOUNTER — Encounter: Payer: Self-pay | Admitting: Radiation Oncology

## 2013-08-09 VITALS — BP 124/78 | HR 109 | Resp 16 | Wt 151.1 lb

## 2013-08-09 DIAGNOSIS — C50411 Malignant neoplasm of upper-outer quadrant of right female breast: Secondary | ICD-10-CM

## 2013-08-09 NOTE — Progress Notes (Signed)
Denies pain at this time. Denies skin changes within treatment. Reports using radiaplex as directed. Denies fatigue.

## 2013-08-09 NOTE — Progress Notes (Signed)
Weekly Management Note Current Dose:  18 Gy  Projected Dose: 60.4 Gy   Narrative:  The patient presents for routine under treatment assessment.  CBCT/MVCT images/Port film x-rays were reviewed.  The chart was checked. Doing well. In good spirits.   Physical Findings: Weight: 151 lb 1.6 oz (68.539 kg). Pink left chest wall.  Impression:  The patient is tolerating radiation.  Plan:  Continue treatment as planned. Continue radiaplex.

## 2013-08-11 ENCOUNTER — Ambulatory Visit
Admission: RE | Admit: 2013-08-11 | Discharge: 2013-08-11 | Disposition: A | Discharge status: Home / Self Care | Payer: Medicare Other | Source: Ambulatory Visit | Attending: Radiation Oncology | Admitting: Radiation Oncology

## 2013-08-11 DIAGNOSIS — C50119 Malignant neoplasm of central portion of unspecified female breast: Secondary | ICD-10-CM | POA: Diagnosis not present

## 2013-08-14 ENCOUNTER — Ambulatory Visit
Admission: RE | Admit: 2013-08-14 | Discharge: 2013-08-14 | Disposition: A | Discharge status: Home / Self Care | Payer: Medicare Other | Source: Ambulatory Visit | Attending: Radiation Oncology | Admitting: Radiation Oncology

## 2013-08-14 ENCOUNTER — Encounter: Payer: Medicare Other | Admitting: Physical Therapy

## 2013-08-15 ENCOUNTER — Ambulatory Visit
Admission: RE | Admit: 2013-08-15 | Discharge: 2013-08-15 | Disposition: A | Discharge status: Home / Self Care | Payer: Medicare Other | Source: Ambulatory Visit | Attending: Radiation Oncology | Admitting: Radiation Oncology

## 2013-08-15 ENCOUNTER — Encounter: Payer: Self-pay | Admitting: Radiation Oncology

## 2013-08-15 VITALS — BP 129/66 | HR 66 | Temp 98.7°F | Resp 16 | Wt 152.3 lb

## 2013-08-15 DIAGNOSIS — C50411 Malignant neoplasm of upper-outer quadrant of right female breast: Secondary | ICD-10-CM

## 2013-08-15 NOTE — Progress Notes (Signed)
Weekly Management Note Current Dose:  23.4 Gy  Projected Dose:60.4  Gy   Narrative:  The patient presents for routine under treatment assessment.  CBCT/MVCT images/Port film x-rays were reviewed.  The chart was checked. Doing well. Using radiaplex.  Physical Findings: Weight: 152 lb 4.8 oz (69.083 kg). Unchanged  Impression:  The patient is tolerating radiation.  Plan:  Continue treatment as planned. Continue RT. Needs to finish by 1/23

## 2013-08-15 NOTE — Progress Notes (Signed)
Weekly rad txs, 13 completed so far, erythema ,skinintact,using radiap;lex tid, c/o itchiness no pain, eating and drinking well, slight fatigue,:I get real sleepy" 12:44 PM

## 2013-08-16 ENCOUNTER — Ambulatory Visit
Admission: RE | Admit: 2013-08-16 | Discharge: 2013-08-16 | Disposition: A | Discharge status: Home / Self Care | Payer: Medicare Other | Source: Ambulatory Visit | Attending: Radiation Oncology | Admitting: Radiation Oncology

## 2013-08-18 ENCOUNTER — Ambulatory Visit
Admission: RE | Admit: 2013-08-18 | Discharge: 2013-08-18 | Disposition: A | Discharge status: Home / Self Care | Payer: Medicare Other | Source: Ambulatory Visit | Attending: Radiation Oncology | Admitting: Radiation Oncology

## 2013-08-18 DIAGNOSIS — Z510 Encounter for antineoplastic radiation therapy: Secondary | ICD-10-CM | POA: Diagnosis not present

## 2013-08-18 DIAGNOSIS — C50919 Malignant neoplasm of unspecified site of unspecified female breast: Secondary | ICD-10-CM | POA: Diagnosis not present

## 2013-08-18 DIAGNOSIS — L259 Unspecified contact dermatitis, unspecified cause: Secondary | ICD-10-CM | POA: Diagnosis not present

## 2013-08-21 ENCOUNTER — Ambulatory Visit
Admission: RE | Admit: 2013-08-21 | Discharge: 2013-08-21 | Disposition: A | Discharge status: Home / Self Care | Payer: Medicare Other | Source: Ambulatory Visit | Attending: Radiation Oncology | Admitting: Radiation Oncology

## 2013-08-21 ENCOUNTER — Ambulatory Visit: Payer: Medicare Other | Attending: General Surgery | Admitting: Physical Therapy

## 2013-08-21 DIAGNOSIS — R293 Abnormal posture: Secondary | ICD-10-CM | POA: Insufficient documentation

## 2013-08-21 DIAGNOSIS — L259 Unspecified contact dermatitis, unspecified cause: Secondary | ICD-10-CM | POA: Diagnosis not present

## 2013-08-21 DIAGNOSIS — C50919 Malignant neoplasm of unspecified site of unspecified female breast: Secondary | ICD-10-CM | POA: Insufficient documentation

## 2013-08-21 DIAGNOSIS — C50119 Malignant neoplasm of central portion of unspecified female breast: Secondary | ICD-10-CM | POA: Diagnosis not present

## 2013-08-21 DIAGNOSIS — Z510 Encounter for antineoplastic radiation therapy: Secondary | ICD-10-CM | POA: Diagnosis not present

## 2013-08-21 DIAGNOSIS — IMO0001 Reserved for inherently not codable concepts without codable children: Secondary | ICD-10-CM | POA: Diagnosis not present

## 2013-08-22 ENCOUNTER — Ambulatory Visit
Admission: RE | Admit: 2013-08-22 | Discharge: 2013-08-22 | Disposition: A | Discharge status: Home / Self Care | Payer: Medicare Other | Source: Ambulatory Visit | Attending: Radiation Oncology | Admitting: Radiation Oncology

## 2013-08-22 ENCOUNTER — Ambulatory Visit: Admission: RE | Admit: 2013-08-22 | Payer: Medicare Other | Source: Ambulatory Visit | Admitting: Radiation Oncology

## 2013-08-22 VITALS — BP 126/65 | HR 69 | Temp 98.1°F | Wt 151.3 lb

## 2013-08-22 DIAGNOSIS — Z510 Encounter for antineoplastic radiation therapy: Secondary | ICD-10-CM | POA: Diagnosis not present

## 2013-08-22 DIAGNOSIS — L259 Unspecified contact dermatitis, unspecified cause: Secondary | ICD-10-CM | POA: Diagnosis not present

## 2013-08-22 DIAGNOSIS — C50411 Malignant neoplasm of upper-outer quadrant of right female breast: Secondary | ICD-10-CM

## 2013-08-22 DIAGNOSIS — C50919 Malignant neoplasm of unspecified site of unspecified female breast: Secondary | ICD-10-CM | POA: Diagnosis not present

## 2013-08-22 MED ORDER — RADIAPLEXRX EX GEL
Freq: Once | CUTANEOUS | Status: AC
  Administered 2013-08-22: 1 via TOPICAL

## 2013-08-22 NOTE — Progress Notes (Signed)
Weekly Management Note Current Dose: 30.6  Gy  Projected Dose:  50.4 Gy   Narrative:  The patient presents for routine under treatment assessment.  CBCT/MVCT images/Port film x-rays were reviewed.  The chart was checked. Doing well. Attempted ebeam markout but her expander will not allow [place] to have an enface direction.  Physical Findings: Weight: 151 lb 4.8 oz (68.629 kg). Dermatitis medially.  Impression:  The patient is tolerating radiation.  Plan:  Continue treatment as planned. Continue radiaplex/hydrocortisone.

## 2013-08-22 NOTE — Progress Notes (Signed)
Weekly assessment of radiation to right breast.Skin red with follicular rash.Using hydrocortisone 1% and given another tube of radiaplex.Generalized fatigue.

## 2013-08-23 ENCOUNTER — Ambulatory Visit
Admission: RE | Admit: 2013-08-23 | Discharge: 2013-08-23 | Disposition: A | Discharge status: Home / Self Care | Payer: Medicare Other | Source: Ambulatory Visit | Attending: Radiation Oncology | Admitting: Radiation Oncology

## 2013-08-23 DIAGNOSIS — L259 Unspecified contact dermatitis, unspecified cause: Secondary | ICD-10-CM | POA: Diagnosis not present

## 2013-08-23 DIAGNOSIS — Z510 Encounter for antineoplastic radiation therapy: Secondary | ICD-10-CM | POA: Diagnosis not present

## 2013-08-23 DIAGNOSIS — C50919 Malignant neoplasm of unspecified site of unspecified female breast: Secondary | ICD-10-CM | POA: Diagnosis not present

## 2013-08-24 ENCOUNTER — Ambulatory Visit
Admission: RE | Admit: 2013-08-24 | Discharge: 2013-08-24 | Disposition: A | Discharge status: Home / Self Care | Payer: Medicare Other | Source: Ambulatory Visit | Attending: Radiation Oncology | Admitting: Radiation Oncology

## 2013-08-24 DIAGNOSIS — Z510 Encounter for antineoplastic radiation therapy: Secondary | ICD-10-CM | POA: Diagnosis not present

## 2013-08-24 DIAGNOSIS — L259 Unspecified contact dermatitis, unspecified cause: Secondary | ICD-10-CM | POA: Diagnosis not present

## 2013-08-24 DIAGNOSIS — C50919 Malignant neoplasm of unspecified site of unspecified female breast: Secondary | ICD-10-CM | POA: Diagnosis not present

## 2013-08-25 ENCOUNTER — Ambulatory Visit
Admission: RE | Admit: 2013-08-25 | Discharge: 2013-08-25 | Disposition: A | Discharge status: Home / Self Care | Payer: Medicare Other | Source: Ambulatory Visit | Attending: Radiation Oncology | Admitting: Radiation Oncology

## 2013-08-25 DIAGNOSIS — Z510 Encounter for antineoplastic radiation therapy: Secondary | ICD-10-CM | POA: Diagnosis not present

## 2013-08-25 DIAGNOSIS — L259 Unspecified contact dermatitis, unspecified cause: Secondary | ICD-10-CM | POA: Diagnosis not present

## 2013-08-25 DIAGNOSIS — C50919 Malignant neoplasm of unspecified site of unspecified female breast: Secondary | ICD-10-CM | POA: Diagnosis not present

## 2013-08-28 ENCOUNTER — Encounter: Payer: Medicare Other | Admitting: Physical Therapy

## 2013-08-28 ENCOUNTER — Ambulatory Visit
Admission: RE | Admit: 2013-08-28 | Discharge: 2013-08-28 | Disposition: A | Discharge status: Home / Self Care | Payer: Medicare Other | Source: Ambulatory Visit | Attending: Radiation Oncology | Admitting: Radiation Oncology

## 2013-08-28 DIAGNOSIS — L259 Unspecified contact dermatitis, unspecified cause: Secondary | ICD-10-CM | POA: Diagnosis not present

## 2013-08-28 DIAGNOSIS — C50119 Malignant neoplasm of central portion of unspecified female breast: Secondary | ICD-10-CM | POA: Diagnosis not present

## 2013-08-28 DIAGNOSIS — Z510 Encounter for antineoplastic radiation therapy: Secondary | ICD-10-CM | POA: Diagnosis not present

## 2013-08-28 DIAGNOSIS — C50919 Malignant neoplasm of unspecified site of unspecified female breast: Secondary | ICD-10-CM | POA: Diagnosis not present

## 2013-08-29 ENCOUNTER — Ambulatory Visit
Admission: RE | Admit: 2013-08-29 | Discharge: 2013-08-29 | Disposition: A | Discharge status: Home / Self Care | Payer: Medicare Other | Source: Ambulatory Visit | Attending: Radiation Oncology | Admitting: Radiation Oncology

## 2013-08-29 ENCOUNTER — Encounter: Payer: Self-pay | Admitting: Radiation Oncology

## 2013-08-29 VITALS — BP 138/77 | HR 68 | Temp 97.8°F | Ht 63.0 in | Wt 152.8 lb

## 2013-08-29 DIAGNOSIS — L259 Unspecified contact dermatitis, unspecified cause: Secondary | ICD-10-CM | POA: Diagnosis not present

## 2013-08-29 DIAGNOSIS — C50411 Malignant neoplasm of upper-outer quadrant of right female breast: Secondary | ICD-10-CM

## 2013-08-29 DIAGNOSIS — C50919 Malignant neoplasm of unspecified site of unspecified female breast: Secondary | ICD-10-CM | POA: Diagnosis not present

## 2013-08-29 DIAGNOSIS — Z510 Encounter for antineoplastic radiation therapy: Secondary | ICD-10-CM | POA: Diagnosis not present

## 2013-08-29 MED ORDER — BIAFINE EX EMUL
CUTANEOUS | Status: DC | PRN
  Administered 2013-08-29: 10:00:00 via TOPICAL

## 2013-08-29 NOTE — Progress Notes (Signed)
Weekly Management Note Current Dose: 19.8  Gy  Projected Dose: 60.4 Gy   Narrative:  The patient presents for routine under treatment assessment.  CBCT/MVCT images/Port film x-rays were reviewed.  The chart was checked. Doing well. Minor irritation in medial aspect of right chest wall.  Physical Findings: Weight: 152 lb 12.8 oz (69.31 kg). Dermatitis medially. Bright pink breast skin around expander.   Impression:  The patient is tolerating radiation.  Plan:  Continue treatment as planned. Switch to biafene. OK to BID boost if needed. Pt has contacted Dr. Harlow Mares.

## 2013-08-29 NOTE — Addendum Note (Signed)
Encounter addended by: Deirdre Evener, RN on: 08/29/2013 11:36 AM<BR>     Documentation filed: Inpatient MAR

## 2013-08-29 NOTE — Progress Notes (Addendum)
Ms. Ende has bright erythema on the right breast with rash-like appearance in the upper inner portion of her tx field with itching, but skin remains intact  She c/o soreness in the right axilla.   She notes increased tightness of the right breast.  She reports feeling more "sleepy than fatigued" and is taking naps during the day.

## 2013-08-29 NOTE — Addendum Note (Signed)
Encounter addended by: Deirdre Evener, RN on: 08/29/2013 11:26 AM<BR>     Documentation filed: Orders

## 2013-08-29 NOTE — Addendum Note (Signed)
Encounter addended by: Deirdre Evener, RN on: 08/29/2013 11:40 AM<BR>     Documentation filed: Notes Section

## 2013-08-29 NOTE — Progress Notes (Signed)
0945 Given Biafine to replace RAdiaplex Gel as ordered by Dr. Pablo Ledger for the presentation of 'radiation dermatitis to the right upper, inner portion of the right breast.  Instructions to use BID, after treatment and at bedtime.

## 2013-08-30 ENCOUNTER — Ambulatory Visit
Admission: RE | Admit: 2013-08-30 | Discharge: 2013-08-30 | Disposition: A | Discharge status: Home / Self Care | Payer: Medicare Other | Source: Ambulatory Visit | Attending: Radiation Oncology | Admitting: Radiation Oncology

## 2013-08-30 DIAGNOSIS — Z510 Encounter for antineoplastic radiation therapy: Secondary | ICD-10-CM | POA: Diagnosis not present

## 2013-08-30 DIAGNOSIS — L259 Unspecified contact dermatitis, unspecified cause: Secondary | ICD-10-CM | POA: Diagnosis not present

## 2013-08-30 DIAGNOSIS — C50919 Malignant neoplasm of unspecified site of unspecified female breast: Secondary | ICD-10-CM | POA: Diagnosis not present

## 2013-08-31 ENCOUNTER — Ambulatory Visit
Admission: RE | Admit: 2013-08-31 | Discharge: 2013-08-31 | Disposition: A | Discharge status: Home / Self Care | Payer: Medicare Other | Source: Ambulatory Visit | Attending: Radiation Oncology | Admitting: Radiation Oncology

## 2013-08-31 DIAGNOSIS — Z510 Encounter for antineoplastic radiation therapy: Secondary | ICD-10-CM | POA: Diagnosis not present

## 2013-08-31 DIAGNOSIS — C50919 Malignant neoplasm of unspecified site of unspecified female breast: Secondary | ICD-10-CM | POA: Diagnosis not present

## 2013-08-31 DIAGNOSIS — L259 Unspecified contact dermatitis, unspecified cause: Secondary | ICD-10-CM | POA: Diagnosis not present

## 2013-09-01 ENCOUNTER — Telehealth: Payer: Self-pay | Admitting: Oncology

## 2013-09-01 ENCOUNTER — Ambulatory Visit
Admission: RE | Admit: 2013-09-01 | Discharge: 2013-09-01 | Disposition: A | Discharge status: Home / Self Care | Payer: Medicare Other | Source: Ambulatory Visit | Attending: Radiation Oncology | Admitting: Radiation Oncology

## 2013-09-01 ENCOUNTER — Ambulatory Visit (HOSPITAL_BASED_OUTPATIENT_CLINIC_OR_DEPARTMENT_OTHER): Payer: Medicare Other | Admitting: Oncology

## 2013-09-01 VITALS — BP 133/76 | HR 79 | Temp 97.8°F | Resp 18 | Ht 63.0 in | Wt 152.3 lb

## 2013-09-01 DIAGNOSIS — C50119 Malignant neoplasm of central portion of unspecified female breast: Secondary | ICD-10-CM | POA: Diagnosis not present

## 2013-09-01 DIAGNOSIS — Z170 Estrogen receptor positive status [ER+]: Secondary | ICD-10-CM

## 2013-09-01 DIAGNOSIS — C50411 Malignant neoplasm of upper-outer quadrant of right female breast: Secondary | ICD-10-CM

## 2013-09-01 DIAGNOSIS — Z510 Encounter for antineoplastic radiation therapy: Secondary | ICD-10-CM | POA: Diagnosis not present

## 2013-09-01 DIAGNOSIS — N899 Noninflammatory disorder of vagina, unspecified: Secondary | ICD-10-CM

## 2013-09-01 DIAGNOSIS — C50919 Malignant neoplasm of unspecified site of unspecified female breast: Secondary | ICD-10-CM | POA: Diagnosis not present

## 2013-09-01 DIAGNOSIS — L259 Unspecified contact dermatitis, unspecified cause: Secondary | ICD-10-CM | POA: Diagnosis not present

## 2013-09-01 DIAGNOSIS — C50419 Malignant neoplasm of upper-outer quadrant of unspecified female breast: Secondary | ICD-10-CM

## 2013-09-01 MED ORDER — PRAVASTATIN SODIUM 40 MG PO TABS: 40.0000 mg | ORAL_TABLET | Freq: Every day | ORAL | Status: DC

## 2013-09-01 MED ORDER — ALLOPURINOL 100 MG PO TABS: 100.0000 mg | ORAL_TABLET | Freq: Every day | ORAL | Status: DC

## 2013-09-01 NOTE — Progress Notes (Signed)
ID: [name] OB: 03-Nov-1945  MR#: [id number]  RDE#:[id number]  PCP: No PCP Per Patient GYN:   SU: [name] OTHER MD: [name] [phone])  PCP: No PCP Per Patient GYN: SU: [name] OTHER MD: [name]    HISTORY OF PRESENT ILLNESS: [name] had routine screening mammography July of 2014 showing a suspicious mass in her right breast. Additional views confirmed a mass in the upper outer quadrant of the right breast, which by ultrasound measured 4 mm. Biopsy of this mass was obtained in [place], at [facility]. 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  The patient's subsequent history is as detailed below  INTERVAL HISTORY: Tracey [name] returns today for followup of her breast cancer accompanied by her husband [name] and their daughter [name]. After her last visit here she decided against going to [place] to get her radiation and instead has been receiving her radiation treatments here in [place]. She will complete those next week. She has continued on anastrozole. She also participated in the "pelvic health" physical therapy program here. She is planning to go back to [place] later this month  REVIEW OF SYSTEMS: Tracey [name] is tolerating the radiation well, with some skin itching but no peeling. She does feel a little bit more fatigued, in that she goes to bed earlier at night and gets up a little later in the morning, but otherwise her activities are within normal activities for her this time of year. She is tolerating the anastrozole well, with vaginal dryness as the main problem. At the class she learned about lubricants and is using Replens and another lubricant with success. There is no dyspareunia. As part of that program she found something the size of a "pea" in her in [place] this and she wanted me to  examine that today. She has had a mild left-sided headache, with mild sinus symptoms. She has been sticking cotton in her right ear and wanted me to take a look in there to make sure no clubbing was left behind. She is concerned about how to examine her breasts at present. A detailed review of systems otherwise was noncontributory  PAST MEDICAL HISTORY: Past Medical History  Diagnosis Date  . Arthritis   . Gout     takes Allopurinol daily and Colchicine daily prn;last attack 72yr ago  . Tachycardia     takes Metoprolol daily  . Hyperlipidemia     takes Pravastatin daily  . Gout   . Breast cancer 2014    ER+/PR+/HEr2-,   . Colon polyps     2 polyps by report  . Rhinitis     uses Flonase prn  . Insomnia     takes Ambien nightly prn  . Anxiety     takes Ativan daily prn  . Peripheral edema     takes Furosemide daily prn  . PONV (postoperative nausea and vomiting)   . History of bronchitis     last time at least 847yrago  . Osteoarthritis   . Bruises easily   . GERD (gastroesophageal reflux disease)     occasionally takes Nexium   . Diverticulosis   . History of bladder infections     many yrs ago  . Depression     takes Effexor daily  . Allergy     PAST SURGICAL HISTORY: Past Surgical History  Procedure Laterality Date  .  Appendectomy    . Gallbladder surgery    . Cholecystectomy    . Cosmetic surgery    . Tonsillectomy    . Abdominal hysterectomy    . Cardiac catheterization  1999  . Colonoscopy    . Bilateral cataract surgery    . Urinary tightening      d/t urinary incontinence  . Total mastectomy Bilateral 05/23/2013    WITH RECONSTRUCTION  . Total mastectomy Bilateral 05/23/2013    Procedure: TOTAL MASTECTOMY;  Surgeon: [name], MD;  Location: [facility];  Service: General;  Laterality: Bilateral;  . Axillary sentinel node biopsy Right 05/23/2013    Procedure: AXILLARY SENTINEL NODE BIOPSY;  Surgeon: [name], MD;  Location: [facility];  Service:  General;  Laterality: Right;  nuc med injection 7:00  . Breast reconstruction with placement of tissue expander and flex hd (acellular hydrated dermis) Bilateral 05/23/2013    Procedure: BILATERAL BREAST RECONSTRUCTION WITH PLACEMENT OF TISSUE EXPANDER AND FLEX HD;  Surgeon: [name], MD;  Location: [facility];  Service: Plastics;  Laterality: Bilateral;    FAMILY HISTORY Family History  Problem Relation Age of Onset  . Breast cancer Mother     possible inflammatory breast cancer  . Heart attack Father   . Heart disease Father   . ALS Sister   . Breast cancer Maternal Grandmother 88  . Heart attack Maternal Grandfather   . Heart attack Paternal Grandfather   . Breast cancer Maternal Aunt     dx in her 55s  . Breast cancer Other     maternal great grandmother; dx in her 66s   the patient's father died from heart disease at the age of 60. The patient's mother died from breast cancer at the age of 79. It is not clear when she was diagnosed since she "dictated and". The patient had no brothers. One sister died from [place] at the age of 22 in addition, one of the patient's mother is sisters was diagnosed with breast cancer in her 28s. The patient's mother is mother, was also diagnosed with breast cancer, at the age of 44. The patient's daughter, Tracey [name], has been tested for the BRCA genes and was negative.  GYNECOLOGIC HISTORY:  Menarche age 49, first live birth age 46, the patient is Tracey [name] P2. She underwent total abdominal hysterectomy and bilateral salpingo-oophorectomy at the age of 39 for endometriosis. She took hormone replacement until August of 2014.  SOCIAL HISTORY:  Tracey [name] recently moved to [facility] and is planning to make this area her "home-base", although they also have a home in [place] where they go for the winter to ski. 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 They are now enjoying their retirement. Daughter [name] lives in [place]. A younger daughter [name] is a homemaker in [place]. The patient has 3 grandchildren. She is a Furniture conservator/restorer.    ADVANCED DIRECTIVES: In place   HEALTH MAINTENANCE: History  Substance Use Topics  . Smoking status: Former Smoker -- 1.00 packs/day for 20 years    Start date: 06/09/1992  . Smokeless tobacco: Never Used     Comment: quit at age 40  . Alcohol Use: 3.0 oz/week    5 Glasses of wine per week     Comment: nightly     Colonoscopy: 2010  PAP: August 2014  Bone density: 2010, normal per patient  Lipid panel:  Allergies  Allergen Reactions  . Amoxicillin Nausea And Vomiting  . Ivp Dye [Iodinated Diagnostic Agents] Hives  . Sulfa Antibiotics Swelling    Current Outpatient Prescriptions  Medication Sig Dispense Refill  . allopurinol (ZYLOPRIM) 100 MG tablet Take 100 mg by mouth daily.      Marland Kitchen anastrozole (ARIMIDEX) 1 MG tablet Take 1 tablet (1 mg total) by mouth daily.  30 tablet  5  . calcium carbonate 200 MG capsule Take 250 mg by mouth 2 (two) times daily with a meal.      . Cyanocobalamin (VITAMIN B-12 IJ) Inject as directed every 30 (thirty) days.      Marland Kitchen docusate sodium 100 MG CAPS Take 100 mg by mouth daily.  10 capsule  0  . fluticasone (FLONASE) 50 MCG/ACT nasal spray Place 1 spray into the nose daily as needed for rhinitis.       . furosemide (LASIX) 40 MG tablet Take 40 mg by mouth daily as needed for fluid.       . hyaluronate sodium (RADIAPLEXRX) GEL Apply 1 application topically 2 (two) times daily.      Marland Kitchen LORazepam (ATIVAN) 0.5 MG tablet Take 0.5 mg by mouth at bedtime as needed for anxiety (or sleep).      . methocarbamol (ROBAXIN) 500 MG tablet Take 1 tablet (500 mg total) by mouth 4 (four) times daily -  before meals and at bedtime.  30 tablet  1  . metoprolol succinate (TOPROL-XL) 25 MG 24 hr tablet Take 25 mg by mouth daily.      . non-metallic deodorant Jethro Poling) MISC Apply 1 application topically daily as  needed.      . pravastatin (PRAVACHOL) 40 MG tablet Take 40 mg by mouth daily.      Marland Kitchen venlafaxine XR (EFFEXOR-XR) 37.5 MG 24 hr capsule Take 2 capsules (75 mg total) by mouth daily.  60 capsule  4   No current facility-administered medications for this visit.    OBJECTIVE: Middle-aged white woman who appears well Filed Vitals:   09/01/13 1026  BP: 133/76  Pulse: 79  Temp: 97.8 F (36.6 C)  Resp: 18     Body mass index is 26.99 kg/(m^2).    ECOG FS:1 - Symptomatic but completely ambulatory Filed Weights   09/01/13 1026  Weight: 152 lb 4.8 oz (69.083 kg)   Sclerae unicteric, status post remote cataract surgery Oropharynx clear, good dentition Right external uric canal is clear, tympanic membrane is pearly with normal light reflex No cervical or supraclavicular adenopathy Lungs clear to auscultation, good excursion bilaterally Heart regular rate and rhythm Abd soft, nontender, positive bowel sounds Pelvic exam shows no abnormality to inspection. By palpation there is a minimal irregularity at the introitus measuring perhaps 2 mm, with no erosion, erythema, or tenderness. This is likely to be scar tissue. It only warrants followup. MSK mild kyphosis but no focal spinal tenderness, no upper extremity lymphedema Neuro: non-focal, well-oriented, appropriate affect Breasts: Status post bilateral mastectomies with expander placement. The left breast area is considerably more superior than the right and very different in shape. The incisions are healing nicely. Both axillae are benign  LAB RESULTS:   Lab Results  Component Value Date   WBC 5.0 06/28/2013   NEUTROABS 2.5 06/28/2013   HGB 13.5 06/28/2013   HCT 40.7 06/28/2013   MCV 90.8 06/28/2013   PLT 173 06/28/2013      Chemistry      Component Value Date/Time   NA 139 06/28/2013 1012  NA 137 05/16/2013 1540   K 4.2 06/28/2013 1012   K 4.3 05/16/2013 1540   CL 101 05/16/2013 1540   CO2 22 06/28/2013 1012   CO2 24 05/16/2013  1540   BUN 16.8 06/28/2013 1012   BUN 21 05/16/2013 1540   CREATININE 0.9 06/28/2013 1012   CREATININE 1.03 05/16/2013 1540      Component Value Date/Time   CALCIUM 10.0 06/28/2013 1012   CALCIUM 9.9 05/16/2013 1540   ALKPHOS 87 06/28/2013 1012   ALKPHOS 71 05/16/2013 1540   AST 20 06/28/2013 1012   AST 23 05/16/2013 1540   ALT 18 06/28/2013 1012   ALT 22 05/16/2013 1540   BILITOT 0.49 06/28/2013 1012   BILITOT 0.4 05/16/2013 1540       STUDIES: CLINICAL DATA: 68 year old postmenopausal female and with history  of breast cancer. Patient takes calcium with vitamin D and a mass  resolve. Patient stopped taking hormones in October 2014.  EXAM:  DUAL X-RAY ABSORPTIOMETRY (DXA) FOR BONE MINERAL DENSITY  COMPARISON: None.  FINDINGS:  AP LUMBAR SPINE (L1-L4)  Bone Mineral Density (BMD): 1.139 g/cm2  Young Adult T Score: 0.8  Z Score: 2.9  LEFT FEMUR (NECK)  Bone Mineral Density (BMD): 0.808 g/cm2  Young Adult T Score: -0.4  Z Score: 1.3  ASSESSMENT: Patient's diagnostic category is NORMAL by WHO Criteria.  FRACTURE RISK: NOT INCREASED   ASSESSMENT: 68 y.o. BRCA 1 & 2 negative [place] woman   (1)  status post right breast upper outer quadrant biopsy 03/24/2013 for a pT1b cN0, stage IA invasive ductal carcinoma, grade 1, strongly estrogen and progesterone receptor positive, HER-2 negative  (2) status post bilateral mastectomies 05/23/2013 showing:  (a) on the right, a pT1b pN0, stage invasive ductal carcinoma, grade 1, again HER-2 negative  (b) on the left, no evidence of malignancy  (3) Oncotype DX score of 20 predicted a rate of distant recurrence within 10 years of 13% if the patient's only systemic treatment was tamoxifen for 5 years.  (4) a positive margin will require limited radiation, which is being planned in [place]  (5) she is undergoing bilateral expander stretching with a view to definitive implant placement sometime January 20 15th.  (6)  started on  anastrozole, mid September 2014, prior to definitive surgery; continued postop; bone density 07/31/2013 was normal    PLAN: We spent the better part on our today going over Cayleigh's concerns. I demonstrated how to self exam and her breast and I suggested that she make a drawing of the places where she has a couple of Ross spots. If these were to change of course she would want to let us know. However right now this is simply due to irregularities in her expander and these are likely to resolve when she has the final implant placed.  The "pea" size spot that she notices in her introitus seems to me benign. This can be followed by future exams, but she does not have a gynecologist. When she returns to [facility] in April we will refer her to a gynecologist for routine examination, Pap, etc. In addition she and her husband will need an internist here in town. Today we discussed several possibilities and she will make a phone call for an appointment in May. She will let me know she is a difficulty obtaining that.  I refilled her allopurinol and Pravachol. She will let us know if she needs any other medications refilled before she returns in April.  Overall Rudene is  doing very well. She greatly appreciated the "pelvic health" program. The plan is for 2 years of anastrozole to be followed by 3 years of tamoxifen, and as soon as we start on the tamoxifen she can start Vagifem suppositories, which will likely take care of the issue.   Deeanne is a good understanding of the overall plan. She agrees with it. She knows to call for any problems that may develop before next visit.  Chauncey Cruel, MD   09/01/2013 10:33 AM

## 2013-09-04 ENCOUNTER — Ambulatory Visit
Admission: RE | Admit: 2013-09-04 | Discharge: 2013-09-04 | Disposition: A | Discharge status: Home / Self Care | Payer: Medicare Other | Source: Ambulatory Visit | Attending: Radiation Oncology | Admitting: Radiation Oncology

## 2013-09-04 DIAGNOSIS — C50919 Malignant neoplasm of unspecified site of unspecified female breast: Secondary | ICD-10-CM | POA: Diagnosis not present

## 2013-09-04 DIAGNOSIS — Z510 Encounter for antineoplastic radiation therapy: Secondary | ICD-10-CM | POA: Diagnosis not present

## 2013-09-04 DIAGNOSIS — C50119 Malignant neoplasm of central portion of unspecified female breast: Secondary | ICD-10-CM | POA: Diagnosis not present

## 2013-09-04 DIAGNOSIS — L259 Unspecified contact dermatitis, unspecified cause: Secondary | ICD-10-CM | POA: Diagnosis not present

## 2013-09-04 NOTE — Addendum Note (Signed)
Addended by: Laureen Abrahams on: 09/04/2013 05:58 PM   Modules accepted: Orders, Medications

## 2013-09-05 ENCOUNTER — Ambulatory Visit
Admission: RE | Admit: 2013-09-05 | Discharge: 2013-09-05 | Disposition: A | Discharge status: Home / Self Care | Payer: Medicare Other | Source: Ambulatory Visit | Attending: Radiation Oncology | Admitting: Radiation Oncology

## 2013-09-05 ENCOUNTER — Encounter: Payer: Self-pay | Admitting: Radiation Oncology

## 2013-09-05 VITALS — BP 143/81 | HR 86 | Resp 16 | Wt 150.6 lb

## 2013-09-05 DIAGNOSIS — L259 Unspecified contact dermatitis, unspecified cause: Secondary | ICD-10-CM | POA: Diagnosis not present

## 2013-09-05 DIAGNOSIS — C50411 Malignant neoplasm of upper-outer quadrant of right female breast: Secondary | ICD-10-CM

## 2013-09-05 DIAGNOSIS — Z510 Encounter for antineoplastic radiation therapy: Secondary | ICD-10-CM | POA: Diagnosis not present

## 2013-09-05 DIAGNOSIS — C50919 Malignant neoplasm of unspecified site of unspecified female breast: Secondary | ICD-10-CM | POA: Diagnosis not present

## 2013-09-05 NOTE — Progress Notes (Signed)
Hyperpigmentation of right axilla noted without breaks in the skin. Reports fatigue. Patient states, "I am weepy today." Reports using radiaplex and biafine bid as directed. Provided non adherent dressings to act as a barrier under right axilla. Also, encouraged patient to put a small pillow under her right arm for relief. Patient verbalized understanding of all reviewed.

## 2013-09-05 NOTE — Progress Notes (Signed)
Weekly Management Note Current Dose: 48.6  Gy  Projected Dose: 60.4 Gy   Narrative:  The patient presents for routine under treatment assessment.  CBCT/MVCT images/Port film x-rays were reviewed.  The chart was checked. More irritation medially and under arm. Itching medially. Discomfort under arm. Seeing [employer].  Physical Findings: Weight: 150 lb 9.6 oz (68.312 kg). Dermatitis medially. Pink skin over chest wall and under arm  Impression:  The patient is tolerating radiation.  Plan:  Continue treatment as planned. Continue biafene. Recheck skin on Thursday.

## 2013-09-06 ENCOUNTER — Ambulatory Visit
Admission: RE | Admit: 2013-09-06 | Discharge: 2013-09-06 | Disposition: A | Discharge status: Home / Self Care | Payer: Medicare Other | Source: Ambulatory Visit | Attending: Radiation Oncology | Admitting: Radiation Oncology

## 2013-09-06 ENCOUNTER — Encounter: Payer: Self-pay | Admitting: Radiation Oncology

## 2013-09-06 DIAGNOSIS — C50919 Malignant neoplasm of unspecified site of unspecified female breast: Secondary | ICD-10-CM | POA: Diagnosis not present

## 2013-09-06 DIAGNOSIS — C50119 Malignant neoplasm of central portion of unspecified female breast: Secondary | ICD-10-CM | POA: Diagnosis not present

## 2013-09-06 DIAGNOSIS — L259 Unspecified contact dermatitis, unspecified cause: Secondary | ICD-10-CM | POA: Diagnosis not present

## 2013-09-06 DIAGNOSIS — Z510 Encounter for antineoplastic radiation therapy: Secondary | ICD-10-CM | POA: Diagnosis not present

## 2013-09-07 ENCOUNTER — Ambulatory Visit
Admission: RE | Admit: 2013-09-07 | Discharge: 2013-09-07 | Disposition: A | Discharge status: Home / Self Care | Payer: Medicare Other | Source: Ambulatory Visit | Attending: Radiation Oncology | Admitting: Radiation Oncology

## 2013-09-07 ENCOUNTER — Encounter: Payer: Self-pay | Admitting: Radiation Oncology

## 2013-09-07 ENCOUNTER — Ambulatory Visit: Payer: Medicare Other

## 2013-09-07 VITALS — BP 134/71 | HR 79 | Temp 98.4°F

## 2013-09-07 DIAGNOSIS — C50919 Malignant neoplasm of unspecified site of unspecified female breast: Secondary | ICD-10-CM | POA: Diagnosis not present

## 2013-09-07 DIAGNOSIS — C50411 Malignant neoplasm of upper-outer quadrant of right female breast: Secondary | ICD-10-CM

## 2013-09-07 DIAGNOSIS — L259 Unspecified contact dermatitis, unspecified cause: Secondary | ICD-10-CM | POA: Diagnosis not present

## 2013-09-07 DIAGNOSIS — Z510 Encounter for antineoplastic radiation therapy: Secondary | ICD-10-CM | POA: Diagnosis not present

## 2013-09-07 NOTE — Progress Notes (Signed)
Patient for skin check to determine whether to continue with radiation.Skin is red.Discomfort of right axilla.

## 2013-09-07 NOTE — Progress Notes (Signed)
Weekly Management Note Current Dose: 54.4  Gy  Projected Dose: 56.4 Gy   Narrative:  The patient presents for routine under treatment assessment.  CBCT/MVCT images/Port film x-rays were reviewed.  The chart was checked. Doing well. Skin itching and red. Under right axilla is sore.   Physical Findings: Bright pink, dry desquamation over right chest wall. Skin intact. No moist desquamation.   Impression:  The patient is tolerating radiation.  Plan:  Continue treatment as planned this afternoon. No treatments tomorrow. Continue biafene. Then start lotion with vit e and massage. Add neosporin if needed for blistering.

## 2013-09-08 ENCOUNTER — Ambulatory Visit: Payer: Medicare Other

## 2013-09-08 NOTE — Progress Notes (Signed)
°  Radiation Oncology         (336) [phone] ________________________________  Name: Tracey [name] MRN: [id number]  Date: 09/07/2013  DOB: [birth year]  End of Treatment Note  Diagnosis:   T1bN0 Invasive Ductal Carcinoma of the Right breast (s/p bilateral mastectomies with positive margin on the right)     Indication for treatment:  Curative       Radiation treatment dates:   07/27/2013-09/07/2013  Site/dose:   Upper right chest wall - 50.4 Gy at 1.8 Gray per fraction x 28 fractions followed by a boost to 6 Gy at 2 Gy per fraction x 3 fractions  Beams/energy:   Opposed tangents with reduced fields with 6 MV photons were used for both fields. A modification of the MLCs of the tangent fields to cone down on the particular area of concern was made for the boost.   Narrative: The patient tolerated radiation treatment relatively well.   She had the expected skin dermatitis and irritation.  We had planned for 2 more boost treatments but she had reached maximum dry desquamation after 56.4 Gy so I cancelled her final 2 treatments.  She was treated BID for her boost at her request so that she could leave and meet her family in [place].  Plan: The patient has completed radiation treatment. The patient will return to radiation oncology clinic for routine followup in March when she is back in [place]. She has follow up with medical oncology then and plastic surgery.  She knows she can call if her skin worsens while she is out of town.   ------------------------------------------------  [name], MD

## 2013-09-20 NOTE — Progress Notes (Deleted)
Name: Tracey [name]   MRN: [id number]  Date:  07/21/14   DOB: Mar 20, 1946  Status:outpatient    DIAGNOSIS: Breast cancer.  CONSENT VERIFIED: yes   SET UP: Patient is setup supine   IMMOBILIZATION:  The following immobilization was used:Custom Moldable Pillow, breast board.   NARRATIVE: Tracey [name] underwent complex simulation and treatment planning for her boost treatment today.  Her tumor volume was outlined on the planning CT scan.  Due to the complexity of her contour with the expander in place, electrons were not appropriate and a photon plan of "mini tangents was developed  I personally supervised and approved the creation of 2 unique MLCs comprising 2 treatment devices.

## 2013-09-21 NOTE — Progress Notes (Signed)
   Name: MARIAHA ELLINGTON MRN: [id number]  Date: 09/01/13 DOB: [birth year]  Status:outpatient  DIAGNOSIS: Breast cancer.  CONSENT VERIFIED: yes  SET UP: Patient is setup supine  IMMOBILIZATION: The following immobilization was used:Custom Moldable Pillow, breast board.  NARRATIVE: Ranae Pila Renshaw underwent complex simulation and treatment planning for her boost treatment today. Her tumor volume was outlined on the planning CT scan. Due to the complexity of her contour with the expander in place, electrons were not appropriate and a photon plan of "mini tangents was developed  I personally supervised and approved the creation of 2 unique MLCs comprising 2 treatment devices.

## 2013-09-21 NOTE — Progress Notes (Signed)
  Radiation Oncology         (336) [phone] ________________________________  Name: Tracey [name] MRN: [id number]  Date: 09/06/2013  DOB: 07/03/1946  Simulation Verification Note  Status: outpatient  NARRATIVE: The patient was brought to the treatment unit and placed in the planned treatment position. The clinical setup was verified. Then port films were obtained and uploaded to the radiation oncology medical record software.  The treatment beams were carefully compared against the planned radiation fields. The position location and shape of the radiation fields was reviewed. The targeted volume of tissue appears appropriately covered by the radiation beams. Organs at risk appear to be excluded as planned.  Based on my personal review, I approved the simulation verification. The patient's treatment will proceed as planned.  ------------------------------------------------  [name], MD

## 2013-09-25 ENCOUNTER — Telehealth: Payer: Self-pay | Admitting: *Deleted

## 2013-09-25 NOTE — Telephone Encounter (Signed)
Pt called stating " I am crying a lot and just feel overall weak- is this normal ?"  Per further discussion and inquiry by RNMarcie [name] states she cries " all day and at night, when I am alone I think about my mother and my sister ( both passed away ),   Tracey [name] states she is able to " go out and do things but I really have to be careful not to cry- no one wants to hear about this, when I get home I just cry".  Pt states she uses benadryl for sleep " about every night ".  Tracey [name] is presently in [place]-   This RN discussed with pt above is normal for her be processing life situations but need for additional support and interventions. Discussed probable need for serontonin replacement with use of antidepressants, exercise and support groups.  " it would be easier to talk about these things with people I don't know "  Pt states she is presently on effexor for hot flashes at low dose of 37.5mg  bid. She has ativan 0.5mg  in her home for history of " panic " feelings post deaths of her mother and sister ( occurred within 2 years of each other ).  Per conversation, Tracey [name] states " they were who I would talk with about these kind of things and now they are not here " " I am usually the happy person around people ".  Plan per call is this RN will discuss above with MD for additional recommendation for antidepressants. Pt will continue with exercise per this discussion.  Per support groups pt states there is a cancer center in [facility] " and  Maybe I can call them"  This RN agreed with above except offered to contact the [facility] for her- which pt was very appreciative.  Pt uses the [facility] in [place].  Return call number given as [phone].

## 2013-09-27 ENCOUNTER — Other Ambulatory Visit: Payer: Self-pay | Admitting: *Deleted

## 2013-09-27 ENCOUNTER — Telehealth: Payer: Self-pay | Admitting: *Deleted

## 2013-09-27 NOTE — Progress Notes (Signed)
This RN called to [employer] [facility] per MD to make a referral for primary care with Dr Lutricia Feil.  Was transferred to a VM for Mechele Claude - new pt scheduler and message left with pt information, this RN contact numbers and MD request.

## 2013-09-27 NOTE — Telephone Encounter (Signed)
Per MD review this RN spoke with pt per his recommendation to increase current effexor of 37.5mg  bid to 2 tablets bid. This RN also informed pt that message was left with the SW at [facility] regarding referring pt for support services.  Discussed with Tracey [name]- per website noted surviviorship clinic and this could be of benefit for her. Pt gave verbal ok for this RN to make referral and give her information to above medical facility.  [name] verbalized understanding of medication increase and will call this RN for refill for appropriate change in dispense amount. She was also very appreciative of discussion stating " just talking with you helps me so much ".  No other needs at this time.

## 2013-10-09 DIAGNOSIS — M6530 Trigger finger, unspecified finger: Secondary | ICD-10-CM | POA: Diagnosis not present

## 2013-10-16 ENCOUNTER — Other Ambulatory Visit: Payer: Self-pay | Admitting: *Deleted

## 2013-10-16 DIAGNOSIS — C50911 Malignant neoplasm of unspecified site of right female breast: Secondary | ICD-10-CM

## 2013-10-16 MED ORDER — VENLAFAXINE HCL ER 37.5 MG PO CP24: ORAL_CAPSULE | ORAL | Status: DC

## 2013-10-20 ENCOUNTER — Ambulatory Visit
Admission: RE | Admit: 2013-10-20 | Discharge: 2013-10-20 | Disposition: A | Discharge status: Home / Self Care | Payer: Medicare Other | Source: Ambulatory Visit | Attending: Radiation Oncology | Admitting: Radiation Oncology

## 2013-10-20 VITALS — BP 137/69 | HR 81 | Temp 98.3°F | Wt 153.4 lb

## 2013-10-20 DIAGNOSIS — C50919 Malignant neoplasm of unspecified site of unspecified female breast: Secondary | ICD-10-CM | POA: Diagnosis not present

## 2013-10-20 DIAGNOSIS — C50411 Malignant neoplasm of upper-outer quadrant of right female breast: Secondary | ICD-10-CM

## 2013-10-20 MED ORDER — CEPHALEXIN 500 MG PO CAPS: 500.0000 mg | ORAL_CAPSULE | Freq: Two times a day (BID) | ORAL | Status: DC

## 2013-10-20 NOTE — Progress Notes (Signed)
Patient for routine one month follow up completion of radiation to right upper chest wall on 09/07/2013.Takes effexor for crying which has helped.has new onset of pain and swelling of right axilla and arm started about one week ago, pain level "4".Continues on arimidex.

## 2013-10-23 NOTE — Progress Notes (Signed)
Department of Radiation Oncology  Phone:  [phone] Fax:        [phone]   Name: Tracey [name] MRN: [id number]  DOB: 1946/03/06  Date: 10/20/2013  Follow Up Visit Note  Diagnosis: T1bN0 Invasive Ductal Carcinoma of the Right breast (s/p bilateral mastectomies with positive margin on the right)   Summary and Interval since last radiation: 2 months from 56.4 Gy to the right chest completed 09/07/13  Interval History: Tracey [name] presents today for routine followup.  She is feeling well and doing well. She has enjoyed her time in [place]. She is tolerating her Arimidex well and Effexor has helped her labile emotions.  She has an appointment with Dr. Harlow Mares and Dr. Zella Richer while she is here.  She saw physical therapy who gave her a pad to place under her arm to help press some of the fluid out but she has not started using that yet today. She is concerned about a "bump" she felt on her expander on the right and some swelling under her arm.  Her feeling is returning under her arm and across her chest on the right. She is massaging her expander and using lotion with vitamin E.   Allergies:  Allergies  Allergen Reactions  . Amoxicillin Nausea And Vomiting  . Ivp Dye [Iodinated Diagnostic Agents] Hives  . Sulfa Antibiotics Swelling    Medications:  Current Outpatient Prescriptions  Medication Sig Dispense Refill  . anastrozole (ARIMIDEX) 1 MG tablet Take 1 tablet (1 mg total) by mouth daily.  30 tablet  5  . calcium-vitamin D (OSCAL WITH D) 250-125 MG-UNIT per tablet Take 1 tablet by mouth daily.      . Cyanocobalamin (VITAMIN B-12 IJ) Inject as directed every 30 (thirty) days.      Marland Kitchen docusate sodium 100 MG CAPS Take 100 mg by mouth daily.  10 capsule  0  . emollient (BIAFINE) cream Apply topically as needed.      . fluticasone (FLONASE) 50 MCG/ACT nasal spray Place 1 spray into the nose daily as needed for rhinitis.       . furosemide (LASIX) 40 MG tablet Take 40 mg by mouth daily  as needed for fluid.       . hyaluronate sodium (RADIAPLEXRX) GEL Apply 1 application topically 2 (two) times daily.      Marland Kitchen LORazepam (ATIVAN) 0.5 MG tablet Take 0.5 mg by mouth at bedtime as needed for anxiety (or sleep).      . methocarbamol (ROBAXIN) 500 MG tablet Take 1 tablet (500 mg total) by mouth 4 (four) times daily -  before meals and at bedtime.  30 tablet  1  . metoprolol succinate (TOPROL-XL) 25 MG 24 hr tablet Take 25 mg by mouth daily.      . non-metallic deodorant Jethro Poling) MISC Apply 1 application topically daily as needed.      . pravastatin (PRAVACHOL) 40 MG tablet Take 1 tablet (40 mg total) by mouth daily.  90 tablet  4  . venlafaxine XR (EFFEXOR-XR) 37.5 MG 24 hr capsule Take 2 tablets twice daily  120 capsule  1  . cephALEXin (KEFLEX) 500 MG capsule Take 1 capsule (500 mg total) by mouth 2 (two) times daily.  14 capsule  0   No current facility-administered medications for this encounter.    Physical Exam:  Filed Vitals:   10/20/13 1506  BP: 137/69  Pulse: 81  Temp: 98.3 F (36.8 C)  Weight: 153 lb 6.4 oz (69.582 kg)  Some hyperpigmentation laterally. The palpable areas of concern are a portion of the exapner and then some scar tissue. I do not feel anything along the scar concerning for recurrent disease.Her axilla looks and feels normal to me.  The area she is concerned about is directly below her SLNBx scar.   IMPRESSION: Tracey [name] is a 68 y.o. female s/p mastectomies and RT with resolving acute effects of treatment  PLAN:  Doing well. I do not feel a recurrence but encouraged her to continue monitoring this area.  She is going to point it out to Dr. Zella Richer as well. She will continue on her Arimidex.  I think the "fullness" she is feeling is actually just the sensation coming back underneath her arm.  She will use the pillow from PT and I will see her back prn. She has follow up with Dr. Zella Richer, Dr. Jana Hakim and Dr. Harlow Mares.     [name], MD

## 2013-10-24 ENCOUNTER — Encounter (INDEPENDENT_AMBULATORY_CARE_PROVIDER_SITE_OTHER): Payer: Self-pay | Admitting: General Surgery

## 2013-10-24 ENCOUNTER — Ambulatory Visit (INDEPENDENT_AMBULATORY_CARE_PROVIDER_SITE_OTHER): Payer: Medicare Other | Admitting: General Surgery

## 2013-10-24 VITALS — BP 118/70 | HR 92 | Resp 14 | Ht 63.0 in | Wt 150.2 lb

## 2013-10-24 DIAGNOSIS — C50919 Malignant neoplasm of unspecified site of unspecified female breast: Secondary | ICD-10-CM | POA: Diagnosis not present

## 2013-10-24 NOTE — Progress Notes (Signed)
Procedure:  Bilateral mastectomies, right axillary sentinel lymph node biopsy, reconstruction with tissue expanders.  Date:  05/23/2013  Pathology:  Invasive right breast cancer extending to one of the anterior tissue margins in the upper flap region, precise site unable to be determined. Sentinel lymph nodes negative for tumor. Estrogen receptor positive.  History:  She is back from [place]. She noticed a little "bump" in the right upper outer quadrant area at the start of her radiation. This has not changed. She thinks she may also feel another area just superior to her incision. She otherwise is doing well. Exam: General- Is in NAD. Chest wall-Unless side there are no chest wall nodularities. On the right side there is a 0.8 cm very smooth nodule in upper-outer quadrant. There is a 5 mm area of irregularity just superior to the incision which smooths out with pressure.  Assessment: Invasive right breast cancer with close or positive superior skin flap margin-She has completed her radiation therapy. She is noted the area in the upper-outer quadrant at the beginning of radiation therapy. It has not changed. He feels very smooth and well circumscribed. The other area feels like an irregularity of the expander.  Plan: Currently, I do not feel this area and the upper-outer quadrant is consistent with recurrence. We'll see her back again in one month for short interval followup.

## 2013-10-24 NOTE — Patient Instructions (Signed)
Call if he either of the areas starts getting larger.

## 2013-11-23 ENCOUNTER — Other Ambulatory Visit: Payer: Self-pay | Admitting: Physician Assistant

## 2013-11-23 DIAGNOSIS — C50411 Malignant neoplasm of upper-outer quadrant of right female breast: Secondary | ICD-10-CM

## 2013-11-23 DIAGNOSIS — C50919 Malignant neoplasm of unspecified site of unspecified female breast: Secondary | ICD-10-CM | POA: Diagnosis not present

## 2013-11-27 ENCOUNTER — Other Ambulatory Visit: Payer: Medicare Other

## 2013-11-27 ENCOUNTER — Ambulatory Visit: Payer: Medicare Other | Admitting: Oncology

## 2013-12-05 ENCOUNTER — Encounter (INDEPENDENT_AMBULATORY_CARE_PROVIDER_SITE_OTHER): Payer: Self-pay | Admitting: General Surgery

## 2013-12-05 ENCOUNTER — Ambulatory Visit (INDEPENDENT_AMBULATORY_CARE_PROVIDER_SITE_OTHER): Payer: Medicare Other | Admitting: General Surgery

## 2013-12-05 VITALS — BP 114/70 | HR 82 | Temp 97.3°F | Resp 16 | Wt 151.2 lb

## 2013-12-05 DIAGNOSIS — C50919 Malignant neoplasm of unspecified site of unspecified female breast: Secondary | ICD-10-CM

## 2013-12-05 NOTE — Patient Instructions (Signed)
Call if you find any new lumps in your breasts or chest wall. 

## 2013-12-05 NOTE — Progress Notes (Signed)
Procedure:  Bilateral mastectomies, right axillary sentinel lymph node biopsy, reconstruction with tissue expanders.  Date:  05/23/2013  Pathology:  Invasive right breast cancer extending to one of the anterior tissue margins in the upper flap region, precise site unable to be determined. Sentinel lymph nodes negative for tumor. Estrogen receptor positive.  History:  She is here for a followup visit for breast cancer. She notices a little asymmetry with respect to her expanders. She wants to get permanent implants placed after golf season. No chest wall nodules. No adenopathy. She has some sensitivity of the skin on the left side. Exam: General- Is in NAD. Chest wall-Back no palpable chest wall nodules on the right or left. Tissue expanders are in place. There is a small amount of asymmetry between the left and right side.  Lymph nodes-no palpable cervical, supraclavicular, or axillary adenopathy  Assessment: Invasive right breast cancer with close or positive superior skin flap margin-There is no clinical evidence of recurrence. No chest wall nodules  Plan: Return visit in 3 months.  I told her I could alternate with Dr. Jana Hakim and he could see her in October and then she wouldn't need to be seen by me in January 2016.

## 2013-12-11 ENCOUNTER — Encounter: Payer: Self-pay | Admitting: Gastroenterology

## 2013-12-11 DIAGNOSIS — Z9010 Acquired absence of unspecified breast and nipple: Secondary | ICD-10-CM | POA: Diagnosis not present

## 2013-12-11 DIAGNOSIS — R0789 Other chest pain: Secondary | ICD-10-CM | POA: Diagnosis not present

## 2013-12-11 DIAGNOSIS — I4729 Other ventricular tachycardia: Secondary | ICD-10-CM | POA: Diagnosis not present

## 2013-12-11 DIAGNOSIS — M109 Gout, unspecified: Secondary | ICD-10-CM | POA: Diagnosis not present

## 2013-12-11 DIAGNOSIS — F411 Generalized anxiety disorder: Secondary | ICD-10-CM | POA: Diagnosis not present

## 2013-12-11 DIAGNOSIS — E538 Deficiency of other specified B group vitamins: Secondary | ICD-10-CM | POA: Diagnosis not present

## 2013-12-11 DIAGNOSIS — I10 Essential (primary) hypertension: Secondary | ICD-10-CM | POA: Diagnosis not present

## 2013-12-11 DIAGNOSIS — I472 Ventricular tachycardia: Secondary | ICD-10-CM | POA: Diagnosis not present

## 2013-12-11 DIAGNOSIS — E785 Hyperlipidemia, unspecified: Secondary | ICD-10-CM | POA: Diagnosis not present

## 2013-12-11 DIAGNOSIS — C50919 Malignant neoplasm of unspecified site of unspecified female breast: Secondary | ICD-10-CM | POA: Diagnosis not present

## 2013-12-14 ENCOUNTER — Other Ambulatory Visit (HOSPITAL_BASED_OUTPATIENT_CLINIC_OR_DEPARTMENT_OTHER): Payer: Medicare Other

## 2013-12-14 ENCOUNTER — Ambulatory Visit (HOSPITAL_BASED_OUTPATIENT_CLINIC_OR_DEPARTMENT_OTHER): Payer: Medicare Other | Admitting: Oncology

## 2013-12-14 ENCOUNTER — Telehealth: Payer: Self-pay | Admitting: Oncology

## 2013-12-14 VITALS — BP 146/79 | HR 77 | Temp 98.3°F | Resp 20 | Ht 63.0 in | Wt 152.4 lb

## 2013-12-14 DIAGNOSIS — Z170 Estrogen receptor positive status [ER+]: Secondary | ICD-10-CM

## 2013-12-14 DIAGNOSIS — M8580 Other specified disorders of bone density and structure, unspecified site: Secondary | ICD-10-CM

## 2013-12-14 DIAGNOSIS — C50419 Malignant neoplasm of upper-outer quadrant of unspecified female breast: Secondary | ICD-10-CM

## 2013-12-14 DIAGNOSIS — C50411 Malignant neoplasm of upper-outer quadrant of right female breast: Secondary | ICD-10-CM

## 2013-12-14 LAB — COMPREHENSIVE METABOLIC PANEL (CC13)
ALK PHOS: 75 U/L (ref 40–150)
ALT: 25 U/L (ref 0–55)
AST: 22 U/L (ref 5–34)
Albumin: 3.8 g/dL (ref 3.5–5.0)
Anion Gap: 12 mEq/L — ABNORMAL HIGH (ref 3–11)
BUN: 16.4 mg/dL (ref 7.0–26.0)
CO2: 21 mEq/L — ABNORMAL LOW (ref 22–29)
Calcium: 9.7 mg/dL (ref 8.4–10.4)
Chloride: 108 mEq/L (ref 98–109)
Creatinine: 1 mg/dL (ref 0.6–1.1)
Glucose: 110 mg/dl (ref 70–140)
Potassium: 4.2 mEq/L (ref 3.5–5.1)
SODIUM: 141 meq/L (ref 136–145)
Total Bilirubin: 0.46 mg/dL (ref 0.20–1.20)
Total Protein: 6.7 g/dL (ref 6.4–8.3)

## 2013-12-14 LAB — CBC WITH DIFFERENTIAL/PLATELET
BASO%: 0.3 % (ref 0.0–2.0)
BASOS ABS: 0 10*3/uL (ref 0.0–0.1)
EOS ABS: 0.1 10*3/uL (ref 0.0–0.5)
EOS%: 3.1 % (ref 0.0–7.0)
HCT: 40.1 % (ref 34.8–46.6)
HEMOGLOBIN: 13.1 g/dL (ref 11.6–15.9)
LYMPH#: 1 10*3/uL (ref 0.9–3.3)
LYMPH%: 28.9 % (ref 14.0–49.7)
MCH: 29.8 pg (ref 25.1–34.0)
MCHC: 32.7 g/dL (ref 31.5–36.0)
MCV: 91.1 fL (ref 79.5–101.0)
MONO#: 0.2 10*3/uL (ref 0.1–0.9)
MONO%: 6.4 % (ref 0.0–14.0)
NEUT#: 2.2 10*3/uL (ref 1.5–6.5)
NEUT%: 61.3 % (ref 38.4–76.8)
PLATELETS: 156 10*3/uL (ref 145–400)
RBC: 4.4 10*6/uL (ref 3.70–5.45)
RDW: 14.3 % (ref 11.2–14.5)
WBC: 3.6 10*3/uL — AB (ref 3.9–10.3)

## 2013-12-14 MED ORDER — VENLAFAXINE HCL ER 150 MG PO CP24: 150.0000 mg | ORAL_CAPSULE | Freq: Every day | ORAL | Status: DC

## 2013-12-14 NOTE — Telephone Encounter (Signed)
, °

## 2013-12-14 NOTE — Progress Notes (Signed)
ID: Tracey [name] OB: 1945-12-18  MR#: [id number]  PIR#:[id number]  PCP: No PCP Per Patient/ [name] GYN:   SU: [name] OTHER MD: [name] [phone])  PCP: No PCP Per Patient GYN: SU: [name] OTHER MD: [name]    HISTORY OF PRESENT ILLNESS: An Kynnedi had routine screening mammography July of 2014 showing a suspicious mass in her right breast. Additional views confirmed a mass in the upper outer quadrant of the right breast, which by ultrasound measured 4 mm. Biopsy of this mass was obtained in [place], at [facility]. It showed (accession number [id number]) an invasive ductal carcinoma measuring 6 mm on the biopsy, grade 1, strongly estrogen and progesterone receptor positive, HER-2 nonamplified.  The patient's subsequent history is as detailed below  INTERVAL HISTORY: Tracey [name] returns today for followup of her breast cancer. Since her last visit here she completed her radiation treatments (in [place], 6-1/2 weeks, to January of this year) and also had her definitive implants placed. She continues on tamoxifen which she is tolerating well, specifically with some hot flashes but no significant vaginal dryness and no problems with arthralgias/myalgias as can occur with this agent. Since her last visit here she is also establish yourself with Dr. Almon Register. she tells me he is setting her up with cardiology, GI, an ophthalmology to "get my health up to date".  REVIEW OF SYSTEMS: Ricquel does complain of a dry mouth. She feels tired. She describes that problem is mild. Sometimes her vision is blurred. She has seasonal allergies including a runny nose and a little bit of difficulty swallowing. She can feel short of breath but only when exerting herself is some climbing stairs or walking up a slope. Her heartburn is well controlled. She feels forgetful, but admits to a great deal of stress and she had a "melt down" while in the grocery store a  few months ago, at which time we suggested she increase her venlafaxine. She is now taking 37.5 tablets, 4 tablets daily. That is "really helped a lot". A detailed review of systems was otherwise noncontributory  PAST MEDICAL HISTORY: Past Medical History  Diagnosis Date  . Arthritis   . Gout     takes Allopurinol daily and Colchicine daily prn;last attack 87yr ago  . Tachycardia     takes Metoprolol daily  . Hyperlipidemia     takes Pravastatin daily  . Gout   . Breast cancer 2014    ER+/PR+/HEr2-,   . Colon polyps     2 polyps by report  . Rhinitis     uses Flonase prn  . Insomnia     takes Ambien nightly prn  . Anxiety     takes Ativan daily prn  . Peripheral edema     takes Furosemide daily prn  . PONV (postoperative nausea and vomiting)   . History of bronchitis     last time at least 879yrago  . Osteoarthritis   . Bruises easily   . GERD (gastroesophageal reflux disease)     occasionally takes Nexium   . Diverticulosis   . History of bladder infections     many yrs ago  . Depression     takes Effexor daily  . Allergy   . Radiation 07/27/13-09/07/13    Right Breast x 31 treatments    PAST SURGICAL HISTORY: Past Surgical History  Procedure Laterality Date  . Appendectomy    . Gallbladder surgery    . Cholecystectomy    .  Cosmetic surgery    . Tonsillectomy    . Abdominal hysterectomy    . Cardiac catheterization  1999  . Colonoscopy    . Bilateral cataract surgery    . Urinary tightening      d/t urinary incontinence  . Total mastectomy Bilateral 05/23/2013    WITH RECONSTRUCTION  . Total mastectomy Bilateral 05/23/2013    Procedure: TOTAL MASTECTOMY;  Surgeon: [name], MD;  Location: [place] [facility];  Service: General;  Laterality: Bilateral;  . Axillary sentinel node biopsy Right 05/23/2013    Procedure: AXILLARY SENTINEL NODE BIOPSY;  Surgeon: [name], MD;  Location: [place];  Service: General;  Laterality: Right;  nuc med injection 7:00  .  Breast reconstruction with placement of tissue expander and flex hd (acellular hydrated dermis) Bilateral 05/23/2013    Procedure: BILATERAL BREAST RECONSTRUCTION WITH PLACEMENT OF TISSUE EXPANDER AND FLEX HD;  Surgeon: [name], MD;  Location: [facility];  Service: Plastics;  Laterality: Bilateral;    FAMILY HISTORY Family History  Problem Relation Age of Onset  . Breast cancer Mother     possible inflammatory breast cancer  . Heart attack Father   . Heart disease Father   . ALS Sister   . Breast cancer Maternal Grandmother 33  . Heart attack Maternal Grandfather   . Heart attack Paternal Grandfather   . Breast cancer Maternal Aunt     dx in her 59s  . Breast cancer Other     maternal great grandmother; dx in her 46s   the patient's father died from heart disease at the age of 63. The patient's mother died from breast cancer at the age of 69. It is not clear when she was diagnosed since she "dictated and". The patient had no brothers. One sister died from [place] at the age of 43 in addition, one of the patient's mother is sisters was diagnosed with breast cancer in her 23s. The patient's mother is mother, was also diagnosed with breast cancer, at the age of 55. The patient's daughter, Tracey [name], has been tested for the BRCA genes and was negative.  GYNECOLOGIC HISTORY:  Menarche age 42, first live birth age 19, the patient is Van P2. She underwent total abdominal hysterectomy and bilateral salpingo-oophorectomy at the age of 54 for endometriosis. She took hormone replacement until August of 2014.  SOCIAL HISTORY:  Tracey [name] recently moved to [facility] and is planning to make this area her "home-base", although they also have a home in [place] where they go for the winter to ski. 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 They are now enjoying their retirement. Daughter Tracey [name] lives in [place]. A younger daughter Tracey [name] is a homemaker in [place]. The patient has 3 grandchildren. She is a Furniture conservator/restorer.    ADVANCED DIRECTIVES: In place   HEALTH MAINTENANCE: History  Substance Use Topics  . Smoking status: Former Smoker -- 1.00 packs/day for 20 years    Start date: 06/09/1992  . Smokeless tobacco: Never Used     Comment: quit at age 34  . Alcohol Use: 3.0 oz/week    5 Glasses of wine per week     Comment: nightly     Colonoscopy: 2010  PAP: August 2014  Bone density: 2010, normal per patient  Lipid panel:  Allergies  Allergen Reactions  . Amoxicillin Nausea And Vomiting  . Ivp Dye [Iodinated Diagnostic Agents] Hives  .  Sulfa Antibiotics Swelling    Current Outpatient Prescriptions  Medication Sig Dispense Refill  . allopurinol (ZYLOPRIM) 100 MG tablet       . anastrozole (ARIMIDEX) 1 MG tablet TAKE 1 TABLET BY MOUTH EVERY DAY  30 tablet  5  . calcium-vitamin D (OSCAL WITH D) 250-125 MG-UNIT per tablet Take 1 tablet by mouth daily.      . celecoxib (CELEBREX) 200 MG capsule       . Cyanocobalamin (VITAMIN B-12 IJ) Inject as directed every 30 (thirty) days.      Marland Kitchen docusate sodium 100 MG CAPS Take 100 mg by mouth daily.  10 capsule  0  . emollient (BIAFINE) cream Apply topically as needed.      . fluticasone (FLONASE) 50 MCG/ACT nasal spray Place 1 spray into the nose daily as needed for rhinitis.       . furosemide (LASIX) 40 MG tablet Take 40 mg by mouth daily as needed for fluid.       . hyaluronate sodium (RADIAPLEXRX) GEL Apply 1 application topically 2 (two) times daily.      Marland Kitchen LORazepam (ATIVAN) 0.5 MG tablet Take 0.5 mg by mouth at bedtime as needed for anxiety (or sleep).      . methocarbamol (ROBAXIN) 500 MG tablet Take 1 tablet (500 mg total) by mouth 4 (four) times daily -  before meals and at bedtime.  30 tablet  1  . metoprolol succinate (TOPROL-XL) 25 MG 24 hr tablet Take 25 mg by mouth daily.      . non-metallic deodorant Jethro Poling) MISC Apply 1 application topically  daily as needed.      . pravastatin (PRAVACHOL) 40 MG tablet Take 1 tablet (40 mg total) by mouth daily.  90 tablet  4  . testosterone cypionate (DEPOTESTOTERONE CYPIONATE) 200 MG/ML injection       . venlafaxine XR (EFFEXOR-XR) 37.5 MG 24 hr capsule Take 2 tablets twice daily  120 capsule  1   No current facility-administered medications for this visit.    OBJECTIVE: Middle-aged white woman who appears  stated age  7 Vitals:   12/14/13 1147  BP: 146/79  Pulse: 77  Temp: 98.3 F (36.8 C)  Resp: 20     Body mass index is 27 kg/(m^2).    ECOG FS:1 - Symptomatic but completely ambulatory Filed Weights   12/14/13 1147  Weight: 152 lb 6.4 oz (69.128 kg)   Sclerae unicteric, status post remote cataract surgery, EOMs intact  Oropharynx clear,  teeth in good repair  No cervical or supraclavicular adenopathy Lungs clear bilaterally Heart regular rate and rhythm Abd soft, nontender, positive bowel sounds MSK mild kyphosis but no focal spinal tenderness, no upper extremity lymphedema Neuro: non-focal, well-oriented, appropriate affect Breasts: Status post bilateral mastectomies with  implants in place.  there is no evidence of local recurrence.  Both axillae are benign  LAB RESULTS:   Lab Results  Component Value Date   WBC 3.6* 12/14/2013   NEUTROABS 2.2 12/14/2013   HGB 13.1 12/14/2013   HCT 40.1 12/14/2013   MCV 91.1 12/14/2013   PLT 156 12/14/2013      Chemistry      Component Value Date/Time   NA 139 06/28/2013 1012   NA 137 05/16/2013 1540   K 4.2 06/28/2013 1012   K 4.3 05/16/2013 1540   CL 101 05/16/2013 1540   CO2 22 06/28/2013 1012   CO2 24 05/16/2013 1540   BUN 16.8 06/28/2013 1012   BUN  21 05/16/2013 1540   CREATININE 0.9 06/28/2013 1012   CREATININE 1.03 05/16/2013 1540      Component Value Date/Time   CALCIUM 10.0 06/28/2013 1012   CALCIUM 9.9 05/16/2013 1540   ALKPHOS 87 06/28/2013 1012   ALKPHOS 71 05/16/2013 1540   AST 20 06/28/2013 1012   AST 23 05/16/2013  1540   ALT 18 06/28/2013 1012   ALT 22 05/16/2013 1540   BILITOT 0.49 06/28/2013 1012   BILITOT 0.4 05/16/2013 1540       STUDIES: CLINICAL DATA: 68 year old postmenopausal female and with history  of breast cancer. Patient takes calcium with vitamin D and a mass  resolve. Patient stopped taking hormones in October 2014.  EXAM:  DUAL X-RAY ABSORPTIOMETRY (DXA) FOR BONE MINERAL DENSITY DECEMBER 2014  COMPARISON: None.  FINDINGS:  AP LUMBAR SPINE (L1-L4)  Bone Mineral Density (BMD): 1.139 g/cm2  Young Adult T Score: 0.8  Z Score: 2.9  LEFT FEMUR (NECK)  Bone Mineral Density (BMD): 0.808 g/cm2  Young Adult T Score: -0.4  Z Score: 1.3  ASSESSMENT: Patient's diagnostic category is NORMAL by WHO Criteria.  FRACTURE RISK: NOT INCREASED   ASSESSMENT: 68 y.o. BRCA 1 & 2 negative [place] woman   (1)  status post right breast upper outer quadrant biopsy 03/24/2013 for a pT1b cN0, stage IA invasive ductal carcinoma, grade 1, strongly estrogen and progesterone receptor positive, HER-2 negative  (2) status post bilateral mastectomies 05/23/2013 showing:  (a) on the right, a pT1b pN0, stage invasive ductal carcinoma, grade 1, again HER-2 negative  (b) on the left, no evidence of malignancy  (3) Oncotype DX score of 20 predicted a rate of distant recurrence within 10 years of 13% if the patient's only systemic treatment was tamoxifen for 5 years.  (4) a positive margin required adjuvant radiation, completed January 2015 in [place]  (5) status post bilateral implant placement January 2015  (6)  started on anastrozole, mid September 2014, prior to definitive surgery; continued postop; bone density 07/31/2013 was normal    PLAN: Raizel is tolerating the anastrozole without any apparent side effects. She had a normal bone density December 2014. The plan is going to be to continue the anastrozole likely for 5 years, but we will obtain a repeat bone density December of 2016 to make sure  there has not been significant bone loss in the interim. Of course she knows she needs to continue calcium and vitamin D supplementation and continue a walking program.   She understands after implant reconstruction physical exam is a sensitive as mammography and no imaging is planned. If there is a recurrence it should be visible and palpable early in its course, but of course we do not anticipate that.   She is going to be seeing a lot of physicians (cardiology, GI, ophthalmology) in the next few months as Dr. Ardeth Perfect updates her health maintenance. Accordingly she will return to see me in October. She usually returns to [place] in November . She is already scheduled, she tells me, to see Dr. Meyer Cory January of next year.  Today I changed her venlafaxine to 150 mg (this is the dose she has been taking as 437.5 tablets daily). Possibly we may be able to drop the dose to 75 at the next visit. I reassured her that her forgetfulness is not a sign of Alzheimer's. It is very common with posttraumatic stress as well as related to some of the medications she is on, including the venlafaxine.  Kaleea has a  good understanding of the overall plan. She agrees with it. She knows the goal of treatment in her cases cure. She will call with any problems that may develop before next visit here.  Chauncey Cruel, MD   12/14/2013 12:01 PM

## 2013-12-14 NOTE — Addendum Note (Signed)
Addended by: Laureen Abrahams on: 12/14/2013 05:34 PM   Modules accepted: Orders

## 2013-12-18 ENCOUNTER — Telehealth: Payer: Self-pay | Admitting: Oncology

## 2013-12-18 NOTE — Telephone Encounter (Signed)
Faxed pt medical records to [facility] °

## 2013-12-21 DIAGNOSIS — Z961 Presence of intraocular lens: Secondary | ICD-10-CM | POA: Diagnosis not present

## 2014-01-01 ENCOUNTER — Encounter: Payer: Self-pay | Admitting: General Surgery

## 2014-01-01 ENCOUNTER — Ambulatory Visit (INDEPENDENT_AMBULATORY_CARE_PROVIDER_SITE_OTHER): Payer: Medicare Other | Admitting: Cardiology

## 2014-01-01 ENCOUNTER — Encounter: Payer: Self-pay | Admitting: Cardiology

## 2014-01-01 VITALS — BP 124/70 | HR 70 | Ht 63.0 in | Wt 150.0 lb

## 2014-01-01 DIAGNOSIS — I25119 Atherosclerotic heart disease of native coronary artery with unspecified angina pectoris: Secondary | ICD-10-CM | POA: Insufficient documentation

## 2014-01-01 DIAGNOSIS — I10 Essential (primary) hypertension: Secondary | ICD-10-CM | POA: Insufficient documentation

## 2014-01-01 DIAGNOSIS — R000 Tachycardia, unspecified: Secondary | ICD-10-CM | POA: Diagnosis not present

## 2014-01-01 DIAGNOSIS — I4729 Other ventricular tachycardia: Secondary | ICD-10-CM | POA: Insufficient documentation

## 2014-01-01 DIAGNOSIS — R079 Chest pain, unspecified: Secondary | ICD-10-CM | POA: Insufficient documentation

## 2014-01-01 DIAGNOSIS — I2510 Atherosclerotic heart disease of native coronary artery without angina pectoris: Secondary | ICD-10-CM | POA: Diagnosis not present

## 2014-01-01 DIAGNOSIS — G4733 Obstructive sleep apnea (adult) (pediatric): Secondary | ICD-10-CM | POA: Insufficient documentation

## 2014-01-01 DIAGNOSIS — I472 Ventricular tachycardia: Secondary | ICD-10-CM | POA: Insufficient documentation

## 2014-01-01 HISTORY — DX: Atherosclerotic heart disease of native coronary artery without angina pectoris: I25.10

## 2014-01-01 MED ORDER — ESOMEPRAZOLE MAGNESIUM 40 MG PO CPDR: 40.0000 mg | DELAYED_RELEASE_CAPSULE | Freq: Every day | ORAL | Status: DC

## 2014-01-01 NOTE — Patient Instructions (Signed)
Your physician has recommended you make the following change in your medication:   1. Start Nexium 40 MG 1 tablet daily  Your physician has requested that you have an echocardiogram. Echocardiography is a painless test that uses sound waves to create images of your heart. It provides your doctor with information about the size and shape of your heart and how well your heart's chambers and valves are working. This procedure takes approximately one hour. There are no restrictions for this procedure.  Your physician has requested that you have an exercise stress myoview. For further information please visit [email or web address]. Please follow instruction sheet, as given.  Your physician recommends that you schedule a follow-up appointment 2 weeks after studies are done with Dr Radford Pax

## 2014-01-01 NOTE — Progress Notes (Addendum)
[place], [place], [place]  [address] Phone: [phone] Fax:  [phone]  Date:  01/01/2014   ID:  Tracey [name], DOB 04-17-1946, MRN [id number]  PCP:  [name], MD  Cardiologist:  Fransico Him, MD     History of Present Illness: Tracey [name] is a 68 y.o. female with a history of tachycardia and recent diagnosis of breast cancer and underwent bilateral mastectomy who present with complaints of chest pain.  She has a history of 27 beats of ventricular tachycardia during a sleep study in 2009 and cardiac workup was normal.  She has a history of 40% diagonal on a cath in 1999 with normal LVF that was done for SOB. Recently she has noticed pain that starts in her left wrist and travels up her arm or it starts in her shoulder and travels down her arm and sometimes into her chest.  This mainly occurs while she is in bed in the am.  She has a lot of belching and abdominal bloating.  She takes Nexium PRN.  She describes the chest discomfort as very bad heartburn but cannot describe what the pain feels like.  She has not tried any antacids for it.  It is mainly nonexertional but occasionally she will get discomfort in her arm when walking.  She goes to [facility] for a few months an year and the last trip she noticed SOB that she has not had before.  She has a feeling that she is off balance with the room at times.  She has also noticed that she is sleepy some.  She continues to use CPAP.  Of note she has had this CP and SOB for several years with same symptoms as now and stress test was normal in the past.  Wt Readings from Last 3 Encounters:  01/01/14 150 lb (68.04 kg)  12/14/13 152 lb 6.4 oz (69.128 kg)  12/05/13 151 lb 3.2 oz (68.584 kg)     Past Medical History  Diagnosis Date  . Arthritis   . Gout     takes Allopurinol daily and Colchicine daily prn;last attack 4yr ago  . Tachycardia     takes Metoprolol daily  . Hyperlipidemia     takes Pravastatin daily  . Gout     . Breast cancer 2014    ER+/PR+/HEr2-,   . Colon polyps     2 polyps by report  . Rhinitis     uses Flonase prn  . Insomnia     takes Ambien nightly prn  . Anxiety     takes Ativan daily prn  . Peripheral edema     takes Furosemide daily prn  . PONV (postoperative nausea and vomiting)   . History of bronchitis     last time at least 84yrago  . Osteoarthritis   . Bruises easily   . GERD (gastroesophageal reflux disease)     occasionally takes Nexium   . Diverticulosis   . History of bladder infections     many yrs ago  . Depression     takes Effexor daily  . Allergy   . Radiation 07/27/13-09/07/13    Right Breast x 31 treatments    Current Outpatient Prescriptions  Medication Sig Dispense Refill  . allopurinol (ZYLOPRIM) 100 MG tablet       . anastrozole (ARIMIDEX) 1 MG tablet TAKE 1 TABLET BY MOUTH EVERY DAY  30 tablet  5  . calcium-vitamin D (OSCAL WITH D) 250-125 MG-UNIT  per tablet Take 1 tablet by mouth daily.      . celecoxib (CELEBREX) 200 MG capsule       . Cyanocobalamin (VITAMIN B-12 IJ) Inject as directed every 30 (thirty) days.      Marland Kitchen docusate sodium 100 MG CAPS Take 100 mg by mouth daily.  10 capsule  0  . fluticasone (FLONASE) 50 MCG/ACT nasal spray Place 1 spray into the nose daily as needed for rhinitis.       . furosemide (LASIX) 40 MG tablet Take 40 mg by mouth daily as needed for fluid.       Marland Kitchen LORazepam (ATIVAN) 0.5 MG tablet Take 0.5 mg by mouth at bedtime as needed for anxiety (or sleep).      . metoprolol succinate (TOPROL-XL) 25 MG 24 hr tablet Take 25 mg by mouth daily.      . pravastatin (PRAVACHOL) 40 MG tablet Take 1 tablet (40 mg total) by mouth daily.  90 tablet  4  . testosterone cypionate (DEPOTESTOTERONE CYPIONATE) 200 MG/ML injection       . venlafaxine XR (EFFEXOR-XR) 150 MG 24 hr capsule Take 1 capsule (150 mg total) by mouth daily with breakfast.  90 capsule  4   No current facility-administered medications for this visit.     Allergies:    Allergies  Allergen Reactions  . Amoxicillin Nausea And Vomiting  . Ivp Dye [Iodinated Diagnostic Agents] Hives  . Sulfa Antibiotics Swelling    Social History:  The patient  reports that she has quit smoking. She started smoking about 21 years ago. She has never used smokeless tobacco. She reports that she drinks about 3 ounces of alcohol per week. She reports that she does not use illicit drugs.   Family History:  The patient's family history includes ALS in her sister; Breast cancer in her maternal aunt, mother, and other; Breast cancer (age of onset: 63) in her maternal grandmother; Heart attack in her father, maternal grandfather, and paternal grandfather; Heart disease in her father.   ROS:  Please see the history of present illness.      All other systems reviewed and negative.   PHYSICAL EXAM: VS:  BP 124/70  Pulse 70  Ht 5' 3"  (1.6 m)  Wt 150 lb (68.04 kg)  BMI 26.58 kg/m2 Well nourished, well developed, in no acute distress HEENT: normal Neck: no JVD Cardiac:  normal S1, S2; RRR; no murmur Lungs:  clear to auscultation bilaterally, no wheezing, rhonchi or rales Abd: soft, nontender, no hepatomegaly Ext: no edema Skin: warm and dry Neuro:  CNs 2-12 intact, no focal abnormalities noted  EKG:  NSR with no ST changes     ASSESSMENT AND PLAN:  1. 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 Dyslipidemia - continue Pravastatin 3. Remote history of nonsustained ventricular tachycardia in 2009 at time of sleep study with normal cardiac workup 4. GERD 5. Tachycardia with panic attacks- rate controlled on beta blockers 6. Chest pain - she has typical and atypical symptoms some of which may be due to GERD with esophageal spasm.  I have asked her to start taking her Nexium daily and try mylanta or Maalox.  She has several CRF and known branch vessel CAD by cath in 1999. - increase Nexium to 75m daily - Stress myoview to rule out ischemia - 2D  echo to assess LVF 7.  DOE  Followup 1-2 weeks after studies are completed  Signed, TFransico Him MD 01/01/2014 1:32 PM

## 2014-01-15 DIAGNOSIS — E785 Hyperlipidemia, unspecified: Secondary | ICD-10-CM | POA: Diagnosis not present

## 2014-01-15 DIAGNOSIS — M109 Gout, unspecified: Secondary | ICD-10-CM | POA: Diagnosis not present

## 2014-01-15 DIAGNOSIS — E538 Deficiency of other specified B group vitamins: Secondary | ICD-10-CM | POA: Diagnosis not present

## 2014-01-16 ENCOUNTER — Ambulatory Visit (HOSPITAL_BASED_OUTPATIENT_CLINIC_OR_DEPARTMENT_OTHER): Payer: Medicare Other | Admitting: Radiology

## 2014-01-16 ENCOUNTER — Ambulatory Visit (HOSPITAL_COMMUNITY): Payer: Medicare Other | Attending: Cardiology | Admitting: Radiology

## 2014-01-16 VITALS — BP 145/72 | Ht 63.0 in | Wt 150.0 lb

## 2014-01-16 DIAGNOSIS — I2510 Atherosclerotic heart disease of native coronary artery without angina pectoris: Secondary | ICD-10-CM | POA: Diagnosis not present

## 2014-01-16 DIAGNOSIS — R079 Chest pain, unspecified: Secondary | ICD-10-CM | POA: Diagnosis not present

## 2014-01-16 DIAGNOSIS — R002 Palpitations: Secondary | ICD-10-CM | POA: Diagnosis not present

## 2014-01-16 DIAGNOSIS — R5381 Other malaise: Secondary | ICD-10-CM | POA: Diagnosis not present

## 2014-01-16 DIAGNOSIS — R0602 Shortness of breath: Secondary | ICD-10-CM | POA: Diagnosis not present

## 2014-01-16 DIAGNOSIS — R5383 Other fatigue: Secondary | ICD-10-CM | POA: Insufficient documentation

## 2014-01-16 DIAGNOSIS — R072 Precordial pain: Secondary | ICD-10-CM

## 2014-01-16 DIAGNOSIS — I4891 Unspecified atrial fibrillation: Secondary | ICD-10-CM | POA: Diagnosis not present

## 2014-01-16 DIAGNOSIS — R42 Dizziness and giddiness: Secondary | ICD-10-CM | POA: Diagnosis not present

## 2014-01-16 MED ORDER — TECHNETIUM TC 99M SESTAMIBI GENERIC - CARDIOLITE
30.0000 | Freq: Once | INTRAVENOUS | Status: AC | PRN
  Administered 2014-01-16: 30 via INTRAVENOUS

## 2014-01-16 MED ORDER — TECHNETIUM TC 99M SESTAMIBI GENERIC - CARDIOLITE
10.0000 | Freq: Once | INTRAVENOUS | Status: AC | PRN
  Administered 2014-01-16: 10 via INTRAVENOUS

## 2014-01-16 NOTE — Progress Notes (Signed)
Called patient and reviewed findings of nuclear stress test with patient and reassured her that her echo was fine with normal LVF and mild stiffness of heart muscle which was unlikely to cause fatigue.  Will await results of stress test

## 2014-01-16 NOTE — Progress Notes (Signed)
Echocardiogram performed.  

## 2014-01-16 NOTE — Progress Notes (Signed)
Tracey [name] [address] [place], [place] [address] [phone]    Cardiology Nuclear Med Study  Tracey [name] is a 68 y.o. female     MRN : [id number]     DOB: 03/11/1946  Procedure Date: 01/16/2014  Nuclear Med Background Indication for Stress Test:  Evaluation for Ischemia History:  CAD-CATH-AFIB-Breast CA Cardiac Risk Factors: Family History - CAD, History of Smoking and Lipids  Symptoms:  Chest Pain, Dizziness, DOE, Fatigue and Palpitations   Nuclear Pre-Procedure Caffeine/Decaff Intake:  None NPO After: 8:00pm   Lungs:  clear O2 Sat: 98% on room air. IV 0.9% NS with Angio Cath:  24g  IV Site: L Hand , tolerated well IV Started by:  Irven Baltimore, RN  Chest Size (in):  38 Cup Size: B, Bilateral Mastectomy with expanders  Height: 5\' 3"  (1.6 m)  Weight:  150 lb (68.04 kg)  BMI:  Body mass index is 26.58 kg/(m^2). Tech Comments:  Patient held Toprol x 24 hrs    Nuclear Med Study 1 or 2 day study: 1 day  Stress Test Type:  Stress  Reading MD: N/A  Order Authorizing Provider:  Fransico Him, MD  Resting Radionuclide: Technetium 22m Sestamibi  Resting Radionuclide Dose: 11.0 mCi   Stress Radionuclide:  Technetium 50m Sestamibi  Stress Radionuclide Dose: 33.0 mCi           Stress Protocol Rest HR: 67 Stress HR: 139  Rest BP: 145/72 Stress BP: 187/72  Exercise Time (min): 4:32 METS: 6.40   Predicted Max HR: 151 bpm % Max HR: 92.05 bpm Rate Pressure Product: [phone]   Dose of Adenosine (mg):  n/a Dose of Lexiscan: n/a mg  Dose of Atropine (mg): n/a Dose of Dobutamine: n/a mcg/kg/min (at max HR)  Stress Test Technologist: Ileene Hutchinson, EMT-P  Nuclear Technologist:  Vedia Pereyra, CNMT     Rest Procedure:  Myocardial perfusion imaging was performed at rest 45 minutes following the intravenous administration of Technetium 59m Sestamibi. Rest ECG: NSR anterolateral T wave changes  Stress Procedure:  The patient received IV Lexiscan 0.4  mg over 15-seconds.  Technetium 77m Sestamibi injected at 30-seconds. This patient had sob and chest tightness with exercise. Quantitative spect images were obtained after a 45 minute delay. Stress ECG: No significant change from baseline ECG  QPS Raw Data Images:  Normal; no motion artifact; normal heart/lung ratio. Stress Images:  Normal homogeneous uptake in all areas of the myocardium. Rest Images:  Normal homogeneous uptake in all areas of the myocardium. Subtraction (SDS):  Normal Transient Ischemic Dilatation (Normal <1.22):  0.90 Lung/Heart Ratio (Normal <0.45):  0.42  Quantitative Gated Spect Images QGS EDV:  50 ml QGS ESV:  14 ml  Impression Exercise Capacity:  Poor exercise capacity. BP Response:  Normal blood pressure response. Clinical Symptoms:  Mild chest pain/dyspnea. ECG Impression:  Nondiagnostic due to baseline changes Comparison with Prior Nuclear Study: No images to compare  Overall Impression:  Normal stress nuclear study.  LV Ejection Fraction: 72%.  LV Wall Motion:  NL LV Function; NL Wall Motion   Tracey [name]

## 2014-01-16 NOTE — Progress Notes (Signed)
Quick Note:  Patient notified of ECHO results. Education provided regarding results. She verbalized understanding and indicated that she would like to discuss results directly with Dr. Radford Pax at her earliest convenience. Routed to Dr. Radford Pax. ______

## 2014-01-18 DIAGNOSIS — Z0000 Encounter for general adult medical examination without abnormal findings: Secondary | ICD-10-CM | POA: Diagnosis not present

## 2014-01-18 DIAGNOSIS — R141 Gas pain: Secondary | ICD-10-CM | POA: Diagnosis not present

## 2014-01-18 DIAGNOSIS — E538 Deficiency of other specified B group vitamins: Secondary | ICD-10-CM | POA: Diagnosis not present

## 2014-01-18 DIAGNOSIS — M109 Gout, unspecified: Secondary | ICD-10-CM | POA: Diagnosis not present

## 2014-01-18 DIAGNOSIS — R0789 Other chest pain: Secondary | ICD-10-CM | POA: Diagnosis not present

## 2014-01-18 DIAGNOSIS — I10 Essential (primary) hypertension: Secondary | ICD-10-CM | POA: Diagnosis not present

## 2014-01-18 DIAGNOSIS — E785 Hyperlipidemia, unspecified: Secondary | ICD-10-CM | POA: Diagnosis not present

## 2014-01-18 DIAGNOSIS — Z23 Encounter for immunization: Secondary | ICD-10-CM | POA: Diagnosis not present

## 2014-01-18 DIAGNOSIS — F411 Generalized anxiety disorder: Secondary | ICD-10-CM | POA: Diagnosis not present

## 2014-01-18 NOTE — Progress Notes (Signed)
Quick Note:  LMTCB 6/4 ______ 

## 2014-01-19 ENCOUNTER — Telehealth: Payer: Self-pay | Admitting: Cardiology

## 2014-01-19 NOTE — Telephone Encounter (Signed)
Follow up    Pt returning this office's call please call her back.

## 2014-01-19 NOTE — Telephone Encounter (Signed)
Calling back re: stress test.  Advised that study was normal.

## 2014-01-31 ENCOUNTER — Ambulatory Visit (INDEPENDENT_AMBULATORY_CARE_PROVIDER_SITE_OTHER): Payer: Medicare Other | Admitting: Gastroenterology

## 2014-01-31 ENCOUNTER — Encounter: Payer: Self-pay | Admitting: Gastroenterology

## 2014-01-31 VITALS — BP 122/72 | HR 83 | Ht 63.5 in | Wt 147.0 lb

## 2014-01-31 DIAGNOSIS — R141 Gas pain: Secondary | ICD-10-CM

## 2014-01-31 DIAGNOSIS — R143 Flatulence: Secondary | ICD-10-CM

## 2014-01-31 DIAGNOSIS — I2510 Atherosclerotic heart disease of native coronary artery without angina pectoris: Secondary | ICD-10-CM

## 2014-01-31 DIAGNOSIS — R140 Abdominal distension (gaseous): Secondary | ICD-10-CM

## 2014-01-31 DIAGNOSIS — R142 Eructation: Secondary | ICD-10-CM

## 2014-01-31 DIAGNOSIS — Z86010 Personal history of colonic polyps: Secondary | ICD-10-CM

## 2014-01-31 NOTE — Progress Notes (Signed)
HPI: This is a    very pleasant 69 year old woman whom I am meeting for the first time today.  New in town: Colonoscopy in [place] 3-4 years ago, was told she needed repeat colonoscopy in 3 years.  She has had several colonoscopies before that as well.  She has problems with bloating, upper abdomen.  Usually after eating.  Most days this occurs. This has been going on for many years.  She isn't sure if abdominal girth is from "fat".  Underwent a breath test for this same symptom, told she had what sounds like H. Pylori and was put on antibiotic.  She thinks she felt better after the antibiotic.  Occasionally pyrosis.  Had a chest pain episode, chest pressure in past few months, worked up by cardiology.  Her father had a hiatal hernia.  She has been taking nexium, previously PRN. Started taking it every day for about 2 weeks.    Review of systems: Pertinent positive and negative review of systems were noted in the above HPI section. Complete review of systems was performed and was otherwise normal.    Past Medical History  Diagnosis Date  . Arthritis   . Gout     takes Allopurinol daily and Colchicine daily prn;last attack 65yr ago  . Tachycardia     takes Metoprolol daily  . Hyperlipidemia     takes Pravastatin daily  . Gout   . Breast cancer 2014    ER+/PR+/HEr2-,   . Colon polyps     2 polyps by report  . Rhinitis     uses Flonase prn  . Insomnia     takes Ambien nightly prn  . Anxiety     takes Ativan daily prn  . Peripheral edema     takes Furosemide daily prn  . PONV (postoperative nausea and vomiting)   . History of bronchitis     last time at least 813yrago  . Osteoarthritis   . Bruises easily   . GERD (gastroesophageal reflux disease)     occasionally takes Nexium   . Diverticulosis   . History of bladder infections     many yrs ago  . Depression     takes Effexor daily  . Allergy   . Radiation 07/27/13-09/07/13    Right Breast x 31 treatments  .  Hypertension   . Ventricular tachycardia, non-sustained     during sleep study 2009 with normal cardiac workup  . OSA (obstructive sleep apnea)     on CPAP    Past Surgical History  Procedure Laterality Date  . Appendectomy    . Gallbladder surgery    . Cholecystectomy    . Cosmetic surgery    . Tonsillectomy    . Abdominal hysterectomy    . Cardiac catheterization  1999  . Colonoscopy    . Bilateral cataract surgery    . Urinary tightening      d/t urinary incontinence  . Total mastectomy Bilateral 05/23/2013    WITH RECONSTRUCTION  . Total mastectomy Bilateral 05/23/2013    Procedure: TOTAL MASTECTOMY;  Surgeon: ToOdis HollingsheadMD;  [facility];  Laterality: Bilateral;  . Axillary sentinel node biopsy Right 05/23/2013    Procedure: AXILLARY SENTINEL NODE BIOPSY;  Surgeon: ToOdis HollingsheadMD;  [facility] Service: General;  Laterality: Right;  nuc med injection 7:00  . Breast reconstruction with placement of tissue expander and flex hd (acellular hydrated dermis) Bilateral 05/23/2013  Procedure: BILATERAL BREAST RECONSTRUCTION WITH PLACEMENT OF TISSUE EXPANDER AND FLEX HD;  Surgeon: [name], MD;  Location: [facility];  Service: Plastics;  Laterality: Bilateral;    Current Outpatient Prescriptions  Medication Sig Dispense Refill  . allopurinol (ZYLOPRIM) 100 MG tablet       . anastrozole (ARIMIDEX) 1 MG tablet TAKE 1 TABLET BY MOUTH EVERY DAY  30 tablet  5  . calcium-vitamin D (OSCAL WITH D) 250-125 MG-UNIT per tablet Take 1 tablet by mouth daily.      . celecoxib (CELEBREX) 200 MG capsule       . Cyanocobalamin (VITAMIN B-12 IJ) Inject as directed every 30 (thirty) days.      Marland Kitchen esomeprazole (NEXIUM) 40 MG capsule Take 1 capsule (40 mg total) by mouth daily at 12 noon.  30 capsule  6  . fluticasone (FLONASE) 50 MCG/ACT nasal spray Place 1 spray into the nose daily as needed for rhinitis.       . furosemide (LASIX) 40 MG tablet Take 40 mg by  mouth daily as needed for fluid.       Marland Kitchen LORazepam (ATIVAN) 0.5 MG tablet Take 0.5 mg by mouth at bedtime as needed for anxiety (or sleep).      . metoprolol succinate (TOPROL-XL) 25 MG 24 hr tablet Take 25 mg by mouth daily.      . pravastatin (PRAVACHOL) 40 MG tablet Take 1 tablet (40 mg total) by mouth daily.  90 tablet  4  . testosterone cypionate (DEPOTESTOTERONE CYPIONATE) 200 MG/ML injection       . venlafaxine XR (EFFEXOR-XR) 150 MG 24 hr capsule Take 1 capsule (150 mg total) by mouth daily with breakfast.  90 capsule  4   No current facility-administered medications for this visit.    Allergies as of 01/31/2014 - Review Complete 01/31/2014  Allergen Reaction Noted  . Amoxicillin Nausea And Vomiting 04/25/2013  . Ivp dye [iodinated diagnostic agents] Hives 06/09/2012  . Sulfa antibiotics Swelling 06/09/2012    Family History  Problem Relation Age of Onset  . Breast cancer Mother     possible inflammatory breast cancer  . Heart attack Father   . Heart disease Father   . ALS Sister   . Breast cancer Maternal Grandmother 59  . Heart attack Maternal Grandfather   . Heart attack Paternal Grandfather   . Breast cancer Maternal Aunt     dx in her 40s  . Breast cancer Other     maternal great grandmother; dx in her 14s    History   Social History  . Marital Status: Married    Spouse Name: N/A    Number of Children: 2  . Years of Education: N/A   Occupational History  . Not on file.   Social History Main Topics  . Smoking status: Former Smoker -- 1.00 packs/day for 20 years    Start date: 06/09/1992  . Smokeless tobacco: Never Used     Comment: quit at age 57  . Alcohol Use: 3.0 oz/week    5 Glasses of wine per week     Comment: nightly  . Drug Use: No     Comment: quit 24 years ago  . Sexual Activity: Yes    Birth Control/ Protection: Surgical   Other Topics Concern  . Not on file   Social History Narrative  . No narrative on file       Physical  Exam: BP 122/72  Pulse 83  Ht 5' 3.5" (1.613  m)  Wt 147 lb (66.679 kg)  BMI 25.63 kg/m2  SpO2 99% Constitutional: generally well-appearing Psychiatric: alert and oriented x3 Eyes: extraocular movements intact Mouth: oral pharynx moist, no lesions Neck: supple no lymphadenopathy Cardiovascular: heart regular rate and rhythm Lungs: clear to auscultation bilaterally Abdomen: soft, nontender, nondistended, no obvious ascites, no peritoneal signs, normal bowel sounds Extremities: no lower extremity edema bilaterally Skin: no lesions on visible extremities    Assessment and plan: 68 y.o. female with  personal history of colon polyps, bloating, epigastric discomfort  I do not have records of her previous colonoscopies done in [place]. We will track down those records to see exactly when she is due for surveillance examinations. She has bloating, epigastric discomfort. A lot of this has improved since she started taking proton pump inhibitor on a daily basis rather than when necessary and so I suspect this is somewhat related to GERD or acid. She will continue on her Nexium once daily and she will start taking Gas-X pill with every meal. She will need an EGD but I want to wait before scheduling that until we decide on the timing of her next surveillance colonoscopy.

## 2014-01-31 NOTE — Patient Instructions (Addendum)
We will get records sent from your previous gastroenterologist (3-4 years ago [place] Dr. Frutoso Chase, at [facility]) for review.  This will include any endoscopic (colonoscopies, she has had many of them) procedures and any associated pathology reports.   You should change the way you are taking your antiacid medicine (nexium) so that you are taking it 20-30 minutes prior to a decent meal as that is the way the pill is designed to work most effectively. Start taking one gas ex before every meal. You will need EGD, will decide on timing of next colonoscopy when records reviewed.

## 2014-02-07 ENCOUNTER — Ambulatory Visit (INDEPENDENT_AMBULATORY_CARE_PROVIDER_SITE_OTHER): Payer: Medicare Other | Admitting: Cardiology

## 2014-02-07 VITALS — BP 138/80 | HR 82 | Ht 63.0 in | Wt 148.8 lb

## 2014-02-07 DIAGNOSIS — I10 Essential (primary) hypertension: Secondary | ICD-10-CM

## 2014-02-07 DIAGNOSIS — R079 Chest pain, unspecified: Secondary | ICD-10-CM

## 2014-02-07 DIAGNOSIS — I2510 Atherosclerotic heart disease of native coronary artery without angina pectoris: Secondary | ICD-10-CM | POA: Diagnosis not present

## 2014-02-07 NOTE — Progress Notes (Signed)
[place], [address] [place], [place]  [address] Phone: [phone] Fax:  [phone]  Date:  02/07/2014   ID:  Tracey [name], DOB December 07, 1945, MRN [id number]  PCP:  [name], MD  Cardiologist:  Fransico Him, MD   History of Present Illness:  Tracey [name] is a 68 y.o. female with a history of tachycardia and recent diagnosis of breast cancer and underwent bilateral mastectomy who present with complaints of chest pain. She has a history of 27 beats of ventricular tachycardia during a sleep study in 2009 and cardiac workup was normal. She has a history of 40% diagonal on a cath in 1999 with normal LVF that was done for SOB. Recently she has noticed pain that starts in her left wrist and travels up her arm or it starts in her shoulder and travels down her arm and sometimes into her chest. This mainly occurs while she is in bed in the am. She has a lot of belching and abdominal bloating. She takes Nexium PRN. She describes the chest discomfort as very bad heartburn but cannot describe what the pain feels like.  It is mainly nonexertional but occasionally she will get discomfort in her arm when walking. She goes to [facility] for a few months a year and the last trip she noticed SOB that she has not had before. She has a feeling that she is off balance with the room at times. Of note she has had this CP and SOB for several years with same symptoms as now and stress test was normal in the past.  She recently underwent nuclear stress test showing no ischemia and 2D echo showed normal LVF.  She says that since starting the Nexium daily and taking Mylanta PRN, her chest pain has resolved.  She is now seeing a GI MD and is going to have an endoscopy.  She stopped taking her metoprolol which she had taken for tachycardia with panic attacks when her sister was diagnosed with ALS and her fatigue has improved.    Wt Readings from Last 3 Encounters:  02/07/14 148 lb 12.8 oz (67.495 kg)  01/31/14 147 lb  (66.679 kg)  01/16/14 150 lb (68.04 kg)     Past Medical History  Diagnosis Date  . Arthritis   . Gout     takes Allopurinol daily and Colchicine daily prn;last attack 24yr ago  . Tachycardia     takes Metoprolol daily  . Hyperlipidemia     takes Pravastatin daily  . Gout   . Breast cancer 2014    ER+/PR+/HEr2-,   . Colon polyps     2 polyps by report  . Rhinitis     uses Flonase prn  . Insomnia     takes Ambien nightly prn  . Anxiety     takes Ativan daily prn  . Peripheral edema     takes Furosemide daily prn  . PONV (postoperative nausea and vomiting)   . History of bronchitis     last time at least 851yrago  . Osteoarthritis   . Bruises easily   . GERD (gastroesophageal reflux disease)     occasionally takes Nexium   . Diverticulosis   . History of bladder infections     many yrs ago  . Depression     takes Effexor daily  . Allergy   . Radiation 07/27/13-09/07/13    Right Breast x 31 treatments  . Hypertension   . Ventricular tachycardia, non-sustained  during sleep study 2009 with normal cardiac workup  . OSA (obstructive sleep apnea)     on CPAP    Current Outpatient Prescriptions  Medication Sig Dispense Refill  . allopurinol (ZYLOPRIM) 100 MG tablet       . anastrozole (ARIMIDEX) 1 MG tablet TAKE 1 TABLET BY MOUTH EVERY DAY  30 tablet  5  . calcium-vitamin D (OSCAL WITH D) 250-125 MG-UNIT per tablet Take 1 tablet by mouth daily.      . celecoxib (CELEBREX) 200 MG capsule       . Cyanocobalamin (VITAMIN B-12 IJ) Inject as directed every 30 (thirty) days.      Marland Kitchen esomeprazole (NEXIUM) 40 MG capsule Take 1 capsule (40 mg total) by mouth daily at 12 noon.  30 capsule  6  . fluticasone (FLONASE) 50 MCG/ACT nasal spray Place 1 spray into the nose daily as needed for rhinitis.       . furosemide (LASIX) 40 MG tablet Take 40 mg by mouth daily as needed for fluid.       Marland Kitchen LORazepam (ATIVAN) 0.5 MG tablet Take 0.5 mg by mouth at bedtime as needed for anxiety  (or sleep).      . pravastatin (PRAVACHOL) 40 MG tablet Take 1 tablet (40 mg total) by mouth daily.  90 tablet  4  . testosterone cypionate (DEPOTESTOTERONE CYPIONATE) 200 MG/ML injection       . venlafaxine XR (EFFEXOR-XR) 150 MG 24 hr capsule Take 1 capsule (150 mg total) by mouth daily with breakfast.  90 capsule  4   No current facility-administered medications for this visit.    Allergies:    Allergies  Allergen Reactions  . Amoxicillin Nausea And Vomiting  . Ivp Dye [Iodinated Diagnostic Agents] Hives  . Sulfa Antibiotics Swelling    Social History:  The patient  reports that she has quit smoking. She started smoking about 21 years ago. She has never used smokeless tobacco. She reports that she drinks about 3 ounces of alcohol per week. She reports that she does not use illicit drugs.   Family History:  The patient's family history includes ALS in her sister; Breast cancer in her maternal aunt, mother, and other; Breast cancer (age of onset: 44) in her maternal grandmother; Heart attack in her father, maternal grandfather, and paternal grandfather; Heart disease in her father.   ROS:  Please see the history of present illness.      All other systems reviewed and negative.   PHYSICAL EXAM: VS:  BP 138/80  Pulse 82  Ht 5' 3"  (1.6 m)  Wt 148 lb 12.8 oz (67.495 kg)  BMI 26.37 kg/m2 Well nourished, well developed, in no acute distress HEENT: normal Neck: no JVD Cardiac:  normal S1, S2; RRR; no murmur Lungs:  clear to auscultation bilaterally, no wheezing, rhonchi or rales Abd: soft, nontender, no hepatomegaly Ext: no edema Skin: warm and dry Neuro:  CNs 2-12 intact, no focal abnormalities noted  ASSESSMENT AND PLAN:  1. 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 Dyslipidemia - continue Pravastatin  3. Remote history of nonsustained ventricular tachycardia in 2009 at time of sleep study with normal cardiac workup 4. GERD 5. Chest pain - no ischemia on recent  nuclear stress test and most likely related to GERD with esophageal spasm. - continue Nexium to 29m daily   Followup with me in 1 year  Signed, TFransico Him MD 02/07/2014 8:07 AM

## 2014-02-07 NOTE — Patient Instructions (Signed)
Your physician recommends that you continue on your current medications as directed. Please refer to the Current Medication list given to you today.  Your physician wants you to follow-up in: 1 year with Dr. Turner. You will receive a reminder letter in the mail two months in advance. If you don't receive a letter, please call our office to schedule the follow-up appointment.  

## 2014-02-14 ENCOUNTER — Encounter (INDEPENDENT_AMBULATORY_CARE_PROVIDER_SITE_OTHER): Payer: Self-pay | Admitting: General Surgery

## 2014-02-14 ENCOUNTER — Ambulatory Visit (INDEPENDENT_AMBULATORY_CARE_PROVIDER_SITE_OTHER): Payer: Medicare Other | Admitting: General Surgery

## 2014-02-14 VITALS — BP 128/72 | HR 68 | Temp 97.1°F | Ht 63.0 in | Wt 148.0 lb

## 2014-02-14 DIAGNOSIS — C50919 Malignant neoplasm of unspecified site of unspecified female breast: Secondary | ICD-10-CM

## 2014-02-14 DIAGNOSIS — C50911 Malignant neoplasm of unspecified site of right female breast: Secondary | ICD-10-CM

## 2014-02-14 NOTE — Progress Notes (Signed)
Procedure:  Bilateral mastectomies, right axillary sentinel lymph node biopsy, reconstruction with tissue expanders.  Date:  05/23/2013  Pathology:  Invasive right breast cancer extending to one of the anterior tissue margins in the upper flap region, precise site unable to be determined. Sentinel lymph nodes negative for tumor. Estrogen receptor positive.  History:  She is here for a followup visit for breast cancer. Radiation treatments are complete. She has some tightness in her right pectoralis muscle tendon area. No chest wall nodules. She is due to see Dr. Harlow Mares this afternoon to talk about her second stage of the reconstructive surgery.  She is taking Anastrozole.  She has an appointment with Dr. Jana Hakim in October. Exam: General- Is in NAD. Chest wall-Back no palpable chest wall nodules on the right or left. Tissue expanders are in place. There is a small amount of asymmetry between the left and right side.  Lymph nodes-no palpable cervical, supraclavicular, or axillary adenopathy  Assessment: Invasive right breast cancer with close or positive superior skin flap margin-There is no clinical evidence of recurrence. No chest wall nodules  Plan: Return visit in 6 months.

## 2014-02-14 NOTE — Patient Instructions (Signed)
Call if you find any new lumps in your breasts or chest wall. 

## 2014-03-12 ENCOUNTER — Telehealth: Payer: Self-pay | Admitting: Gastroenterology

## 2014-03-12 NOTE — Telephone Encounter (Signed)
The pt will call the previous office and try and have the records sent to [place] I refaxed the release as well

## 2014-03-12 NOTE — Telephone Encounter (Signed)
Left message on machine to call back  

## 2014-03-24 ENCOUNTER — Ambulatory Visit (INDEPENDENT_AMBULATORY_CARE_PROVIDER_SITE_OTHER): Payer: Medicare Other | Admitting: Emergency Medicine

## 2014-03-24 VITALS — BP 110/58 | HR 97 | Temp 98.3°F | Resp 20 | Ht 63.0 in | Wt 151.4 lb

## 2014-03-24 DIAGNOSIS — J018 Other acute sinusitis: Secondary | ICD-10-CM

## 2014-03-24 DIAGNOSIS — I2510 Atherosclerotic heart disease of native coronary artery without angina pectoris: Secondary | ICD-10-CM | POA: Diagnosis not present

## 2014-03-24 DIAGNOSIS — K120 Recurrent oral aphthae: Secondary | ICD-10-CM

## 2014-03-24 MED ORDER — AMOXICILLIN 875 MG PO TABS: 875.0000 mg | ORAL_TABLET | Freq: Two times a day (BID) | ORAL | Status: DC

## 2014-03-24 NOTE — Progress Notes (Signed)
[facility] [address], [place] [address] [phone]  Date:  03/24/2014   Name:  Tracey [name]   DOB:  [birth year]   MRN:  [id number]  PCP:  [name], MD    Chief Complaint: Sore Throat   History of Present Illness:  Tracey [name] is a 68 y.o. very pleasant female patient who presents with the following:  Has a sore throat.  Nasal congestion and post nasal drainage.  Exposed to strep  No cough or coryza.  No nausea or vomiting.  Exposed to child with strep. No rash No improvement with over the counter medications or other home remedies. Denies other complaint or health concern today.   Patient Active Problem List   Diagnosis Date Noted  . CAD (coronary artery disease), native coronary artery 01/01/2014  . Chest pain 01/01/2014  . Hypertension   . Ventricular tachycardia, non-sustained   . OSA (obstructive sleep apnea)   . Breast cancer of upper-outer quadrant of right female breast 06/28/2013  . Postoperative visit 06/02/2013  . Gout   . Tachycardia     Past Medical History  Diagnosis Date  . Arthritis   . Gout     takes Allopurinol daily and Colchicine daily prn;last attack 34yr ago  . Tachycardia     takes Metoprolol daily  . Hyperlipidemia     takes Pravastatin daily  . Gout   . Breast cancer 2014    ER+/PR+/HEr2-,   . Colon polyps     2 polyps by report  . Rhinitis     uses Flonase prn  . Insomnia     takes Ambien nightly prn  . Anxiety     takes Ativan daily prn  . Peripheral edema     takes Furosemide daily prn  . PONV (postoperative nausea and vomiting)   . History of bronchitis     last time at least 872yrago  . Osteoarthritis   . Bruises easily   . GERD (gastroesophageal reflux disease)     occasionally takes Nexium   . Diverticulosis   . History of bladder infections     many yrs ago  . Depression     takes Effexor daily  . Allergy   . Radiation 07/27/13-09/07/13    Right Breast x 31 treatments  .  Hypertension   . Ventricular tachycardia, non-sustained     during sleep study 2009 with normal cardiac workup  . OSA (obstructive sleep apnea)     on CPAP    Past Surgical History  Procedure Laterality Date  . Appendectomy    . Gallbladder surgery    . Cholecystectomy    . Cosmetic surgery    . Tonsillectomy    . Abdominal hysterectomy    . Cardiac catheterization  1999  . Colonoscopy    . Bilateral cataract surgery    . Urinary tightening      d/t urinary incontinence  . Total mastectomy Bilateral 05/23/2013    WITH RECONSTRUCTION  . Total mastectomy Bilateral 05/23/2013    Procedure: TOTAL MASTECTOMY;  Surgeon: ToOdis HollingsheadMD;  [facility] Service: General;  Laterality: Bilateral;  . Axillary sentinel node biopsy Right 05/23/2013    Procedure: AXILLARY SENTINEL NODE BIOPSY;  Surgeon: ToOdis HollingsheadMD;  [facility] Service: General;  Laterality: Right;  nuc med injection 7:00  . Breast reconstruction with placement of tissue expander and flex hd (acellular hydrated dermis) Bilateral 05/23/2013  Procedure: BILATERAL BREAST RECONSTRUCTION WITH PLACEMENT OF TISSUE EXPANDER AND FLEX HD;  Surgeon: [name], MD;  Location: [facility];  Service: Plastics;  Laterality: Bilateral;    History  Substance Use Topics  . Smoking status: Former Smoker -- 1.00 packs/day for 20 years    Start date: 06/09/1992  . Smokeless tobacco: Never Used     Comment: quit at age 44  . Alcohol Use: 3.0 oz/week    5 Glasses of wine per week     Comment: nightly    Family History  Problem Relation Age of Onset  . Breast cancer Mother     possible inflammatory breast cancer  . Heart attack Father   . Heart disease Father   . ALS Sister   . Breast cancer Maternal Grandmother 5  . Heart attack Maternal Grandfather   . Heart attack Paternal Grandfather   . Breast cancer Maternal Aunt     dx in her 6s  . Breast cancer Other     maternal great grandmother; dx in her 90s     Allergies  Allergen Reactions  . Amoxicillin Nausea And Vomiting  . Ivp Dye [Iodinated Diagnostic Agents] Hives  . Sulfa Antibiotics Swelling    Medication list has been reviewed and updated.  Current Outpatient Prescriptions on File Prior to Visit  Medication Sig Dispense Refill  . allopurinol (ZYLOPRIM) 100 MG tablet       . anastrozole (ARIMIDEX) 1 MG tablet TAKE 1 TABLET BY MOUTH EVERY DAY  30 tablet  5  . calcium-vitamin D (OSCAL WITH D) 250-125 MG-UNIT per tablet Take 1 tablet by mouth daily.      . celecoxib (CELEBREX) 200 MG capsule       . Cyanocobalamin (VITAMIN B-12 IJ) Inject as directed every 30 (thirty) days.      Marland Kitchen esomeprazole (NEXIUM) 40 MG capsule Take 1 capsule (40 mg total) by mouth daily at 12 noon.  30 capsule  6  . fluticasone (FLONASE) 50 MCG/ACT nasal spray Place 1 spray into the nose daily as needed for rhinitis.       . furosemide (LASIX) 40 MG tablet Take 40 mg by mouth daily as needed for fluid.       Marland Kitchen LORazepam (ATIVAN) 0.5 MG tablet Take 0.5 mg by mouth at bedtime as needed for anxiety (or sleep).      . pravastatin (PRAVACHOL) 40 MG tablet Take 1 tablet (40 mg total) by mouth daily.  90 tablet  4  . testosterone cypionate (DEPOTESTOTERONE CYPIONATE) 200 MG/ML injection       . venlafaxine XR (EFFEXOR-XR) 150 MG 24 hr capsule Take 1 capsule (150 mg total) by mouth daily with breakfast.  90 capsule  4   No current facility-administered medications on file prior to visit.    Review of Systems:  As per HPI, otherwise negative.    Physical Examination: Filed Vitals:   03/24/14 1310  BP: 110/58  Pulse: 97  Temp: 98.3 F (36.8 C)  Resp: 20   Filed Vitals:   03/24/14 1310  Height: _0  (1.6 m)  Weight: 151 lb 6.4 oz (68.675 kg)   Body mass index is 26.83 kg/(m^2). Ideal Body Weight: Weight in (lb) to have BMI = 25: 140.8  GEN: WDWN, NAD, Non-toxic, A & O x 3 HEENT: Atraumatic, Normocephalic. Neck supple. No masses, No LAD.  Aphthous  ulcer palate Ears and Nose: No external deformity.  Purulent nasal drainage CV: RRR, No M/G/R. No JVD. No  thrill. No extra heart sounds. PULM: CTA B, no wheezes, crackles, rhonchi. No retractions. No resp. distress. No accessory muscle use. ABD: S, NT, ND, +BS. No rebound. No HSM. EXTR: No c/c/e NEURO Normal gait.  PSYCH: Normally interactive. Conversant. Not depressed or anxious appearing.  Calm demeanor.    Assessment and Plan: Sinusitis Aphthous ulceration Amoxicillin mucinex   Signed,  Ellison Carwin, MD

## 2014-03-24 NOTE — Patient Instructions (Signed)

## 2014-03-27 ENCOUNTER — Telehealth: Payer: Self-pay | Admitting: Gastroenterology

## 2014-03-27 NOTE — Telephone Encounter (Signed)
Left message on machine to call back we need pt to have records sent to Dr Ardis Hughs for review, message for pt to call back if she can not have the records sent to our office

## 2014-03-28 ENCOUNTER — Telehealth: Payer: Self-pay | Admitting: Gastroenterology

## 2014-03-28 NOTE — Telephone Encounter (Signed)
Colonoscopy 04/2010 Dr. Margart Sickles, [facility], done for "prior history of colonic adenomas: findings 1cm of slightly irregular mucosa at hep flexure, biopsied and then obliterated with hot forceps, polyps in rectum; were sessile serated polyps (adenomas).  Please call her.  I have reviewed the 2011 colonoscopy.  She IS due for repeat colonoscopy around now.  Also needs EGD for the bloating, dyspepsia that I saw her for ([facility] colonoscopy and EGD)  Thanks

## 2014-03-28 NOTE — Telephone Encounter (Signed)
The pt will call her surgeon who is doing her upcoming breast reconstruction and see how long she needs to wait to schedule the procedures and call back to schedule

## 2014-04-04 ENCOUNTER — Telehealth: Payer: Self-pay | Admitting: Gastroenterology

## 2014-04-04 NOTE — Telephone Encounter (Signed)
Needs Endo colon

## 2014-04-04 NOTE — Telephone Encounter (Signed)
Pt aware that she is on the wait list and will be called if a cx comes up.

## 2014-04-25 DIAGNOSIS — C50919 Malignant neoplasm of unspecified site of unspecified female breast: Secondary | ICD-10-CM | POA: Diagnosis not present

## 2014-04-27 ENCOUNTER — Other Ambulatory Visit: Payer: Self-pay | Admitting: Plastic Surgery

## 2014-05-09 ENCOUNTER — Encounter (HOSPITAL_COMMUNITY): Payer: Self-pay | Admitting: Pharmacy Technician

## 2014-05-09 ENCOUNTER — Encounter (HOSPITAL_COMMUNITY)
Admission: RE | Admit: 2014-05-09 | Discharge: 2014-05-09 | Disposition: A | Discharge status: Home / Self Care | Payer: Medicare Other | Source: Ambulatory Visit | Attending: Plastic Surgery | Admitting: Plastic Surgery

## 2014-05-09 ENCOUNTER — Encounter (HOSPITAL_COMMUNITY): Payer: Self-pay

## 2014-05-09 DIAGNOSIS — Z01812 Encounter for preprocedural laboratory examination: Secondary | ICD-10-CM | POA: Diagnosis not present

## 2014-05-09 DIAGNOSIS — C50419 Malignant neoplasm of upper-outer quadrant of unspecified female breast: Secondary | ICD-10-CM | POA: Diagnosis not present

## 2014-05-09 HISTORY — DX: Hormone replacement therapy: Z79.890

## 2014-05-09 HISTORY — DX: Personal history of other diseases of urinary system: Z87.448

## 2014-05-09 HISTORY — DX: Nasal congestion: R09.81

## 2014-05-09 LAB — CBC
HEMATOCRIT: 40.9 % (ref 36.0–46.0)
Hemoglobin: 13.9 g/dL (ref 12.0–15.0)
MCH: 30.7 pg (ref 26.0–34.0)
MCHC: 34 g/dL (ref 30.0–36.0)
MCV: 90.3 fL (ref 78.0–100.0)
Platelets: 168 10*3/uL (ref 150–400)
RBC: 4.53 MIL/uL (ref 3.87–5.11)
RDW: 13.9 % (ref 11.5–15.5)
WBC: 3.6 10*3/uL — ABNORMAL LOW (ref 4.0–10.5)

## 2014-05-09 LAB — BASIC METABOLIC PANEL
ANION GAP: 18 — AB (ref 5–15)
BUN: 14 mg/dL (ref 6–23)
CO2: 20 mEq/L (ref 19–32)
CREATININE: 0.82 mg/dL (ref 0.50–1.10)
Calcium: 9.5 mg/dL (ref 8.4–10.5)
Chloride: 99 mEq/L (ref 96–112)
GFR calc non Af Amer: 71 mL/min — ABNORMAL LOW (ref >90)
GFR, EST AFRICAN AMERICAN: 83 mL/min — AB (ref >90)
Glucose, Bld: 131 mg/dL — ABNORMAL HIGH (ref 70–99)
Potassium: 4 mEq/L (ref 3.7–5.3)
Sodium: 137 mEq/L (ref 137–147)

## 2014-05-09 NOTE — Pre-Procedure Instructions (Signed)
Tracey [name]  05/09/2014   Your procedure is scheduled on:  Thursday, October 1  Report to [facility] Admitting at 0530 AM.  Call this number if you have problems the morning of surgery: [phone]   Remember:   Do not eat food or drink liquids after midnight.Wednesday night   Take these medicines the morning of surgery with A SIP OF WATER: Nexium, allopurinol, anastrozole, venlafaxine (Effexor)   Do not wear jewelry, make-up or nail polish.  Do not wear lotions, powders, or perfumes. You may wear deodorant.  Do not shave 48 hours prior to surgery. Men may shave face and neck.  Do not bring valuables to the hospital.  [facility] is not responsible   for any belongings or valuables.               Contacts, dentures or bridgework may not be worn into surgery.  Leave suitcase in the car. After surgery it may be brought to your room.  For patients admitted to the hospital, discharge time is determined by your   treatment team.                 Special Instructions: [place] - Preparing for Surgery  Before surgery, you can play an important role.  Because skin is not sterile, your skin needs to be as free of germs as possible.  You can reduce the number of germs on you skin by washing with CHG (chlorahexidine gluconate) soap before surgery.  CHG is an antiseptic cleaner which kills germs and bonds with the skin to continue killing germs even after washing.  Please DO NOT use if you have an allergy to CHG or antibacterial soaps.  If your skin becomes reddened/irritated stop using the CHG and inform your nurse when you arrive at Short Stay.  Do not shave (including legs and underarms) for at least 48 hours prior to the first CHG shower.  You may shave your face.  Please follow these instructions carefully:   1.  Shower with CHG Soap the night before surgery and the   morning of Surgery.  2.  If you choose to wash your hair, wash your hair first as usual with your   normal  shampoo.  3.  After you shampoo, rinse your hair and body thoroughly to remove the   Shampoo.  4.  Use CHG as you would any other liquid soap.  You can apply chg directly   to the skin and wash gently with scrungie or a clean washcloth.  5.  Apply the CHG Soap to your body ONLY FROM THE NECK DOWN.     Do not use on open wounds or open sores.  Avoid contact with your eyes,   ears, mouth and genitals (private parts).  Wash genitals (private parts)  with your normal soap.  6.  Wash thoroughly, paying special attention to the area where your surgery   will be performed.  7.  Thoroughly rinse your body with warm water from the neck down.  8.  DO NOT shower/wash with your normal soap after using and rinsing off  the CHG Soap.  9.  Pat yourself dry with a clean towel.            10.  Wear clean pajamas.            11.  Place clean sheets on your bed the night of your first shower and do not   sleep with pets.  Day of Surgery  Do not apply any lotions/deoderants the morning of surgery.  Please wear clean clothes to the hospital/surgery center.     Please read over the following fact sheets that you were given: Pain Booklet, Coughing and Deep Breathing and Surgical Site Infection Prevention

## 2014-05-17 ENCOUNTER — Ambulatory Visit (HOSPITAL_COMMUNITY): Payer: Medicare Other | Admitting: Anesthesiology

## 2014-05-17 ENCOUNTER — Ambulatory Visit (HOSPITAL_COMMUNITY)
Admission: RE | Admit: 2014-05-17 | Discharge: 2014-05-18 | Disposition: A | Discharge status: Home / Self Care | Payer: Medicare Other | Source: Ambulatory Visit | Attending: Plastic Surgery | Admitting: Plastic Surgery

## 2014-05-17 ENCOUNTER — Encounter (HOSPITAL_COMMUNITY): Payer: Self-pay | Admitting: Surgery

## 2014-05-17 ENCOUNTER — Encounter (HOSPITAL_COMMUNITY)
Admission: RE | Disposition: A | Discharge status: Home / Self Care | Payer: Self-pay | Source: Ambulatory Visit | Attending: Plastic Surgery

## 2014-05-17 ENCOUNTER — Encounter (HOSPITAL_COMMUNITY): Payer: Medicare Other | Admitting: Anesthesiology

## 2014-05-17 DIAGNOSIS — C50919 Malignant neoplasm of unspecified site of unspecified female breast: Secondary | ICD-10-CM | POA: Diagnosis not present

## 2014-05-17 DIAGNOSIS — G4730 Sleep apnea, unspecified: Secondary | ICD-10-CM | POA: Insufficient documentation

## 2014-05-17 DIAGNOSIS — Z87891 Personal history of nicotine dependence: Secondary | ICD-10-CM | POA: Insufficient documentation

## 2014-05-17 DIAGNOSIS — Z421 Encounter for breast reconstruction following mastectomy: Secondary | ICD-10-CM | POA: Diagnosis not present

## 2014-05-17 DIAGNOSIS — Z9013 Acquired absence of bilateral breasts and nipples: Secondary | ICD-10-CM | POA: Diagnosis not present

## 2014-05-17 DIAGNOSIS — Z853 Personal history of malignant neoplasm of breast: Secondary | ICD-10-CM | POA: Diagnosis not present

## 2014-05-17 DIAGNOSIS — Z9989 Dependence on other enabling machines and devices: Secondary | ICD-10-CM | POA: Insufficient documentation

## 2014-05-17 DIAGNOSIS — C50411 Malignant neoplasm of upper-outer quadrant of right female breast: Secondary | ICD-10-CM | POA: Diagnosis not present

## 2014-05-17 DIAGNOSIS — I10 Essential (primary) hypertension: Secondary | ICD-10-CM | POA: Diagnosis not present

## 2014-05-17 DIAGNOSIS — K219 Gastro-esophageal reflux disease without esophagitis: Secondary | ICD-10-CM | POA: Diagnosis not present

## 2014-05-17 HISTORY — PX: REMOVAL OF BILATERAL TISSUE EXPANDERS WITH PLACEMENT OF BILATERAL BREAST IMPLANTS: SHX6431

## 2014-05-17 HISTORY — PX: BREAST RECONSTRUCTION: SHX9

## 2014-05-17 SURGERY — REMOVAL, TISSUE EXPANDER, BREAST, BILATERAL, WITH BILATERAL IMPLANT IMPLANT INSERTION
Anesthesia: General | Site: Breast | Laterality: Bilateral

## 2014-05-17 MED ORDER — SODIUM CHLORIDE 0.9 % IV SOLN
INTRAVENOUS | Status: DC
  Filled 2014-05-17: qty 1

## 2014-05-17 MED ORDER — HEPARIN SODIUM (PORCINE) 5000 UNIT/ML IJ SOLN
5000.0000 [IU] | Freq: Three times a day (TID) | INTRAMUSCULAR | Status: DC
  Administered 2014-05-17 – 2014-05-18 (×3): 5000 [IU] via SUBCUTANEOUS
  Filled 2014-05-17 (×4): qty 1

## 2014-05-17 MED ORDER — MIDAZOLAM HCL 5 MG/5ML IJ SOLN
INTRAMUSCULAR | Status: DC | PRN
  Administered 2014-05-17: 2 mg via INTRAVENOUS

## 2014-05-17 MED ORDER — FENTANYL CITRATE 0.05 MG/ML IJ SOLN
INTRAMUSCULAR | Status: DC | PRN
  Administered 2014-05-17: 50 ug via INTRAVENOUS
  Administered 2014-05-17: 100 ug via INTRAVENOUS
  Administered 2014-05-17 (×2): 50 ug via INTRAVENOUS

## 2014-05-17 MED ORDER — VECURONIUM BROMIDE 10 MG IV SOLR
INTRAVENOUS | Status: DC | PRN
  Administered 2014-05-17: 2 mg via INTRAVENOUS
  Administered 2014-05-17: 3 mg via INTRAVENOUS

## 2014-05-17 MED ORDER — MIDAZOLAM HCL 2 MG/2ML IJ SOLN
INTRAMUSCULAR | Status: AC
  Filled 2014-05-17: qty 2

## 2014-05-17 MED ORDER — PROMETHAZINE HCL 25 MG/ML IJ SOLN: 6.2500 mg | INTRAMUSCULAR | Status: DC | PRN

## 2014-05-17 MED ORDER — DEXTROSE-NACL 5-0.45 % IV SOLN
INTRAVENOUS | Status: DC
  Administered 2014-05-17 – 2014-05-18 (×2): via INTRAVENOUS

## 2014-05-17 MED ORDER — CEFAZOLIN SODIUM 1-5 GM-% IV SOLN
1.0000 g | Freq: Four times a day (QID) | INTRAVENOUS | Status: DC
  Administered 2014-05-17 – 2014-05-18 (×3): 1 g via INTRAVENOUS
  Filled 2014-05-17 (×5): qty 50

## 2014-05-17 MED ORDER — FENTANYL CITRATE 0.05 MG/ML IJ SOLN
INTRAMUSCULAR | Status: AC
  Filled 2014-05-17: qty 5

## 2014-05-17 MED ORDER — EPHEDRINE SULFATE 50 MG/ML IJ SOLN
INTRAMUSCULAR | Status: AC
  Filled 2014-05-17: qty 1

## 2014-05-17 MED ORDER — PANTOPRAZOLE SODIUM 40 MG PO TBEC: 80.0000 mg | DELAYED_RELEASE_TABLET | Freq: Every day | ORAL | Status: DC

## 2014-05-17 MED ORDER — ONDANSETRON HCL 4 MG/2ML IJ SOLN
INTRAMUSCULAR | Status: AC
  Filled 2014-05-17: qty 4

## 2014-05-17 MED ORDER — SIMVASTATIN 20 MG PO TABS
20.0000 mg | ORAL_TABLET | Freq: Every day | ORAL | Status: DC
  Administered 2014-05-17: 20 mg via ORAL
  Filled 2014-05-17 (×2): qty 1

## 2014-05-17 MED ORDER — MEPERIDINE HCL 25 MG/ML IJ SOLN: 6.2500 mg | INTRAMUSCULAR | Status: DC | PRN

## 2014-05-17 MED ORDER — SODIUM CHLORIDE 0.9 % IR SOLN
Status: DC | PRN
  Administered 2014-05-17: 07:00:00

## 2014-05-17 MED ORDER — OXYCODONE HCL 5 MG PO TABS: 5.0000 mg | ORAL_TABLET | Freq: Once | ORAL | Status: DC | PRN

## 2014-05-17 MED ORDER — ONDANSETRON HCL 4 MG/2ML IJ SOLN: 4.0000 mg | Freq: Once | INTRAMUSCULAR | Status: DC | PRN

## 2014-05-17 MED ORDER — HYDROMORPHONE HCL 1 MG/ML IJ SOLN: 0.2500 mg | INTRAMUSCULAR | Status: DC | PRN

## 2014-05-17 MED ORDER — PROPOFOL 10 MG/ML IV BOLUS
INTRAVENOUS | Status: AC
  Filled 2014-05-17: qty 20

## 2014-05-17 MED ORDER — NEOSTIGMINE METHYLSULFATE 10 MG/10ML IV SOLN
INTRAVENOUS | Status: AC
  Filled 2014-05-17: qty 1

## 2014-05-17 MED ORDER — CEFAZOLIN SODIUM-DEXTROSE 2-3 GM-% IV SOLR
2.0000 g | Freq: Once | INTRAVENOUS | Status: AC
  Administered 2014-05-17: 2 g via INTRAVENOUS

## 2014-05-17 MED ORDER — METHOCARBAMOL 500 MG PO TABS
500.0000 mg | ORAL_TABLET | Freq: Four times a day (QID) | ORAL | Status: DC | PRN
  Administered 2014-05-17 – 2014-05-18 (×2): 500 mg via ORAL
  Filled 2014-05-17 (×2): qty 1

## 2014-05-17 MED ORDER — CHLORHEXIDINE GLUCONATE 4 % EX LIQD
1.0000 | Freq: Once | CUTANEOUS | Status: DC
Start: 2014-05-17 — End: 2014-05-17
  Filled 2014-05-17: qty 15

## 2014-05-17 MED ORDER — ROCURONIUM BROMIDE 100 MG/10ML IV SOLN
INTRAVENOUS | Status: DC | PRN
  Administered 2014-05-17: 50 mg via INTRAVENOUS

## 2014-05-17 MED ORDER — GLYCOPYRROLATE 0.2 MG/ML IJ SOLN
INTRAMUSCULAR | Status: AC
  Filled 2014-05-17: qty 1

## 2014-05-17 MED ORDER — 0.9 % SODIUM CHLORIDE (POUR BTL) OPTIME
TOPICAL | Status: DC | PRN
  Administered 2014-05-17: 1000 mL

## 2014-05-17 MED ORDER — DOCUSATE SODIUM 100 MG PO CAPS
100.0000 mg | ORAL_CAPSULE | Freq: Every day | ORAL | Status: DC
  Administered 2014-05-17: 100 mg via ORAL
  Filled 2014-05-17 (×2): qty 1

## 2014-05-17 MED ORDER — PHENYLEPHRINE 40 MCG/ML (10ML) SYRINGE FOR IV PUSH (FOR BLOOD PRESSURE SUPPORT)
PREFILLED_SYRINGE | INTRAVENOUS | Status: AC
  Filled 2014-05-17: qty 10

## 2014-05-17 MED ORDER — VENLAFAXINE HCL ER 150 MG PO CP24
150.0000 mg | ORAL_CAPSULE | Freq: Every day | ORAL | Status: DC
  Administered 2014-05-18: 150 mg via ORAL
  Filled 2014-05-17 (×2): qty 1

## 2014-05-17 MED ORDER — GLYCOPYRROLATE 0.2 MG/ML IJ SOLN
INTRAMUSCULAR | Status: AC
  Filled 2014-05-17: qty 3

## 2014-05-17 MED ORDER — LIDOCAINE HCL (CARDIAC) 20 MG/ML IV SOLN
INTRAVENOUS | Status: AC
Start: 2014-05-17 — End: 2014-05-17
  Filled 2014-05-17: qty 5

## 2014-05-17 MED ORDER — LIDOCAINE HCL (CARDIAC) 20 MG/ML IV SOLN
INTRAVENOUS | Status: AC
  Filled 2014-05-17: qty 5

## 2014-05-17 MED ORDER — OXYCODONE HCL 5 MG/5ML PO SOLN: 5.0000 mg | Freq: Once | ORAL | Status: DC | PRN

## 2014-05-17 MED ORDER — GLYCOPYRROLATE 0.2 MG/ML IJ SOLN
INTRAMUSCULAR | Status: DC | PRN
  Administered 2014-05-17: 0.6 mg via INTRAVENOUS

## 2014-05-17 MED ORDER — LACTATED RINGERS IV SOLN
INTRAVENOUS | Status: DC | PRN
  Administered 2014-05-17 (×2): via INTRAVENOUS

## 2014-05-17 MED ORDER — DEXAMETHASONE SODIUM PHOSPHATE 4 MG/ML IJ SOLN
INTRAMUSCULAR | Status: DC | PRN
  Administered 2014-05-17: 8 mg via INTRAVENOUS

## 2014-05-17 MED ORDER — STERILE WATER FOR INJECTION IJ SOLN
INTRAMUSCULAR | Status: AC
  Filled 2014-05-17: qty 10

## 2014-05-17 MED ORDER — NEOSTIGMINE METHYLSULFATE 10 MG/10ML IV SOLN
INTRAVENOUS | Status: DC | PRN
  Administered 2014-05-17: 5 mg via INTRAVENOUS

## 2014-05-17 MED ORDER — LIDOCAINE HCL (CARDIAC) 20 MG/ML IV SOLN
INTRAVENOUS | Status: DC | PRN
  Administered 2014-05-17: 100 mg via INTRAVENOUS
  Administered 2014-05-17: 80 mg via INTRAVENOUS

## 2014-05-17 MED ORDER — LORAZEPAM 0.5 MG PO TABS: 0.5000 mg | ORAL_TABLET | Freq: Every evening | ORAL | Status: DC | PRN

## 2014-05-17 MED ORDER — FUROSEMIDE 40 MG PO TABS: 40.0000 mg | ORAL_TABLET | Freq: Every day | ORAL | Status: DC | PRN

## 2014-05-17 MED ORDER — BACITRACIN ZINC 500 UNIT/GM EX OINT
TOPICAL_OINTMENT | CUTANEOUS | Status: AC
  Filled 2014-05-17: qty 15

## 2014-05-17 MED ORDER — HYDROMORPHONE HCL 2 MG PO TABS
2.0000 mg | ORAL_TABLET | ORAL | Status: DC | PRN
  Administered 2014-05-17 – 2014-05-18 (×4): 2 mg via ORAL
  Filled 2014-05-17 (×4): qty 1

## 2014-05-17 MED ORDER — SUCCINYLCHOLINE CHLORIDE 20 MG/ML IJ SOLN
INTRAMUSCULAR | Status: AC
  Filled 2014-05-17: qty 1

## 2014-05-17 MED ORDER — SODIUM CHLORIDE 0.9 % IJ SOLN
INTRAMUSCULAR | Status: AC
  Filled 2014-05-17: qty 10

## 2014-05-17 MED ORDER — INFLUENZA VAC SPLIT QUAD 0.5 ML IM SUSY
0.5000 mL | PREFILLED_SYRINGE | INTRAMUSCULAR | Status: DC
  Filled 2014-05-17: qty 0.5

## 2014-05-17 MED ORDER — DEXAMETHASONE SODIUM PHOSPHATE 4 MG/ML IJ SOLN
INTRAMUSCULAR | Status: AC
  Filled 2014-05-17: qty 2

## 2014-05-17 MED ORDER — ALLOPURINOL 100 MG PO TABS
100.0000 mg | ORAL_TABLET | Freq: Every day | ORAL | Status: DC
  Filled 2014-05-17: qty 1

## 2014-05-17 MED ORDER — PROPOFOL 10 MG/ML IV BOLUS
INTRAVENOUS | Status: DC | PRN
  Administered 2014-05-17: 150 mg via INTRAVENOUS

## 2014-05-17 MED ORDER — VECURONIUM BROMIDE 10 MG IV SOLR
INTRAVENOUS | Status: AC
  Filled 2014-05-17: qty 10

## 2014-05-17 MED ORDER — MENTHOL 3 MG MT LOZG
1.0000 | LOZENGE | OROMUCOSAL | Status: DC | PRN
  Administered 2014-05-17: 3 mg via ORAL
  Filled 2014-05-17: qty 9

## 2014-05-17 MED ORDER — ROCURONIUM BROMIDE 50 MG/5ML IV SOLN
INTRAVENOUS | Status: AC
  Filled 2014-05-17: qty 1

## 2014-05-17 MED ORDER — BACITRACIN ZINC 500 UNIT/GM EX OINT
TOPICAL_OINTMENT | CUTANEOUS | Status: DC | PRN
  Administered 2014-05-17: 1 via TOPICAL

## 2014-05-17 MED ORDER — ONDANSETRON HCL 4 MG/2ML IJ SOLN
INTRAMUSCULAR | Status: DC | PRN
  Administered 2014-05-17 (×2): 4 mg via INTRAVENOUS

## 2014-05-17 MED ORDER — EPHEDRINE SULFATE 50 MG/ML IJ SOLN
INTRAMUSCULAR | Status: DC | PRN
  Administered 2014-05-17: 5 mg via INTRAVENOUS
  Administered 2014-05-17: 10 mg via INTRAVENOUS

## 2014-05-17 SURGICAL SUPPLY — 55 items
ATCH SMKEVC FLXB CAUT HNDSWH (FILTER) ×1 IMPLANT
BAG DECANTER FOR FLEXI CONT (MISCELLANEOUS) ×2 IMPLANT
BANDAGE ELASTIC 6 VELCRO ST LF (GAUZE/BANDAGES/DRESSINGS) ×4 IMPLANT
BINDER BREAST LRG (GAUZE/BANDAGES/DRESSINGS) ×2 IMPLANT
BIOPATCH RED 1 DISK 7.0 (GAUZE/BANDAGES/DRESSINGS) ×4 IMPLANT
BLADE SURG 10 STRL SS (BLADE) ×2 IMPLANT
CANISTER SUCTION 2500CC (MISCELLANEOUS) ×2 IMPLANT
CHLORAPREP W/TINT 26ML (MISCELLANEOUS) ×2 IMPLANT
COVER SURGICAL LIGHT HANDLE (MISCELLANEOUS) ×2 IMPLANT
DERMABOND ADVANCED (GAUZE/BANDAGES/DRESSINGS)
DERMABOND ADVANCED .7 DNX12 (GAUZE/BANDAGES/DRESSINGS) IMPLANT
DRAIN CHANNEL 19F RND (DRAIN) ×4 IMPLANT
DRAPE ORTHO SPLIT 77X108 STRL (DRAPES) ×2
DRAPE PROXIMA HALF (DRAPES) ×4 IMPLANT
DRAPE SURG 17X23 STRL (DRAPES) IMPLANT
DRAPE SURG ORHT 6 SPLT 77X108 (DRAPES) ×2 IMPLANT
DRAPE WARM FLUID 44X44 (DRAPE) ×2 IMPLANT
DRSG PAD ABDOMINAL 8X10 ST (GAUZE/BANDAGES/DRESSINGS) ×2 IMPLANT
DRSG SORBAVIEW 3.5X5-5/16 MED (GAUZE/BANDAGES/DRESSINGS) ×4 IMPLANT
DRSG TEGADERM 4X4.75 (GAUZE/BANDAGES/DRESSINGS) ×2 IMPLANT
DRSG TELFA 3X8 NADH (GAUZE/BANDAGES/DRESSINGS) ×4 IMPLANT
ELECT BLADE 6.5 EXT (BLADE) ×2 IMPLANT
ELECT CAUTERY BLADE 6.4 (BLADE) ×2 IMPLANT
ELECT REM PT RETURN 9FT ADLT (ELECTROSURGICAL) ×2
ELECTRODE REM PT RTRN 9FT ADLT (ELECTROSURGICAL) ×1 IMPLANT
EVACUATOR SILICONE 100CC (DRAIN) ×4 IMPLANT
EVACUATOR SMOKE ACCUVAC VALLEY (FILTER) ×1
GLOVE BIO SURGEON STRL SZ7 (GLOVE) ×2 IMPLANT
GLOVE BIO SURGEON STRL SZ7.5 (GLOVE) ×2 IMPLANT
GLOVE BIOGEL PI IND STRL 7.0 (GLOVE) ×1 IMPLANT
GLOVE BIOGEL PI IND STRL 8 (GLOVE) ×1 IMPLANT
GLOVE BIOGEL PI INDICATOR 7.0 (GLOVE) ×1
GLOVE BIOGEL PI INDICATOR 8 (GLOVE) ×1
GLOVE SURG SS PI 7.0 STRL IVOR (GLOVE) ×2 IMPLANT
GOWN STRL REUS W/ TWL LRG LVL3 (GOWN DISPOSABLE) ×1 IMPLANT
GOWN STRL REUS W/ TWL XL LVL3 (GOWN DISPOSABLE) ×1 IMPLANT
GOWN STRL REUS W/TWL LRG LVL3 (GOWN DISPOSABLE) ×1
GOWN STRL REUS W/TWL XL LVL3 (GOWN DISPOSABLE) ×1
IMPLANT BREAST GEL 440CC (Breast) ×4 IMPLANT
KIT BASIN OR (CUSTOM PROCEDURE TRAY) ×2 IMPLANT
KIT ROOM TURNOVER OR (KITS) ×2 IMPLANT
MARKER SKIN DUAL TIP RULER LAB (MISCELLANEOUS) ×2 IMPLANT
NS IRRIG 1000ML POUR BTL (IV SOLUTION) ×4 IMPLANT
PACK GENERAL/GYN (CUSTOM PROCEDURE TRAY) ×2 IMPLANT
PAD ABD 8X10 STRL (GAUZE/BANDAGES/DRESSINGS) ×2 IMPLANT
PAD ARMBOARD 7.5X6 YLW CONV (MISCELLANEOUS) ×2 IMPLANT
PREFILTER EVAC NS 1 1/3-3/8IN (MISCELLANEOUS) ×2 IMPLANT
SUT MNCRL AB 3-0 PS2 18 (SUTURE) ×8 IMPLANT
SUT PROLENE 3 0 PS 2 (SUTURE) ×10 IMPLANT
SUT VIC AB 2-0 SH 27 (SUTURE) ×2
SUT VIC AB 2-0 SH 27X BRD (SUTURE) ×2 IMPLANT
SUT VIC AB 3-0 SH 18 (SUTURE) ×2 IMPLANT
SYR BULB IRRIGATION 50ML (SYRINGE) ×2 IMPLANT
TOWEL OR 17X26 10 PK STRL BLUE (TOWEL DISPOSABLE) ×2 IMPLANT
TUBE CONNECTING 12X1/4 (SUCTIONS) ×2 IMPLANT

## 2014-05-17 NOTE — Op Note (Signed)
Tracey [name], Tracey [name] NO.:  [id number]  MEDICAL RECORD NO.:  [id number]  LOCATION:  [facility]                         FACILITY:  [facility]  PHYSICIAN:  [name], M.D.     DATE OF BIRTH:  04-02-1946  DATE OF PROCEDURE:  05/17/2014 DATE OF DISCHARGE:                              OPERATIVE REPORT   PREOPERATIVE DIAGNOSES: 1. Right side breast cancer. 2. Left side personal history of breast cancer.  POSTOPERATIVE DIAGNOSES: 1. Right side breast cancer. 2. Left side personal history of breast cancer.  PROCEDURE:  Bilateral removal of tissue expanders and delayed breast reconstruction with mammary shaped silicone gel implants.  SURGEON:  [name], MD  ANESTHESIA:  General.  ESTIMATED BLOOD LOSS:  10 mL.  DRAINS:  One 19-French on each side.  CLINICAL NOTE:  This is a 68 year old woman who has had breast cancer and has had bilateral mastectomy and had placement of tissue expanders. Unfortunately, she had had radiation on the right side and has had significant discrepancy between the 2 sides.  She presents for her delayed breast reconstruction with removal of tissue expanders and placement of implants.  She selected silicone implants and specifically preferred a mammary shaped implant rather than a round implant.  The nature of procedure and risks plus complications were discussed with her in detail.  She understood the FDA recommendation for an MRI every 2 years with silicone gel in place.  She was also given the FDA recommended silicone gel booklet.  Risks were discussed with her and they include but not limited to bleeding, infection, healing problems, scarring, loss of sensation, fluid accumulations, anesthesia complications pneumothorax, DVT, PE, failure of device, capsular contracture, displacement of device, wrinkles and ripples, asymmetry (there would certainly be asymmetry between the 2 sides given the previous radiation on the right side), chronic  pain and contour deformities at the periphery of the reconstruction and overall disappointment.  I had discussed all this, she understood these risks, plus complications and wished to proceed.  DESCRIPTION OF PROCEDURE:  The patient was taken to the operating room after having been marked in the holding area in a full upright position. She had noted in the holding area during the marking that she wanted to have the left side lowered somewhat in a similar position to the right side if possible.  She was taken to the operating room and placed supine.  After successful induction of general anesthesia, she was prepped with ChloraPrep, after waiting for 3 minutes for drying, she was draped with sterile drapes including impervious strip drapes (utility drapes).  The old mastectomy scars were utilized.  Dissection carried through subcutaneous tissue and the underlying tissue expanders were then identified and deflated.  The spaces were inspected and found to be in good condition.  The capsulotomies were performed as indicated first on the left side  inferiorly in order to achieve a more inferior position for her implant.  After thorough irrigation with saline, antibiotic solution was placed and allowed to dwell in the spaces.  A sizer 628 mL silicone gel sizer of mammary shaped was used on the right side that being the radiated side and the more difficult  side 4 in terms of spacing for the implant.  The positions felt that this would permit closure that would be a sound closure.  It was therefore removed and again antibiotic solution placed in the space.  The implants were 440 mL of medium height, high-profile silicone gel implants.  These were soaked in antibiotic solution.  19-French drains were positioned one on each side and brought through separate stab wounds inferiorly and secured with 3-0 Prolene suture.  After thorough cleaning gloves and Betadine prep to the skin, the implants were  positioned and great care was taken to make sure that the orientation stripe was located at the 6 o'clock position on each side.  Antibiotic solution was placed in the space just prior to placement of implants and then placed on the implant prior to muscle closure.  The muscle closures with 3-0 and 2-0 Vicryl simple interrupted sutures with great care taken to avoid damage to underlying implants which were kept under direct vision all times.  The wound irrigated with antibiotic solution and then 3-0 Monocryl interrupted inverted deep dermal sutures, and 3-0 Prolene simple interrupted sutures for reinforcement.  Antibiotic ointment, Telfa, and dry sterile dressings applied.  Biopatch with occlusive sterile dressings applied around the drains.  A circumferential Ace wrap was placed and wrapped in superiorly in order to stabilize the superior poles of the implants. There was a minimal wrap inferiorly.  She was transferred to recovery room stable having tolerated the procedure well.  DISPOSITION:  She will be observed overnight as an outpatient.     [name], M.D.     DB/MEDQ  D:  05/17/2014  T:  05/17/2014  Job:  [id number]

## 2014-05-17 NOTE — Anesthesia Postprocedure Evaluation (Signed)
Anesthesia Post Note  Patient: Tracey [name]  Procedure(s) Performed: Procedure(s) (LRB): REMOVAL OF BILATERAL TISSUE EXPANDERS WITH PLACEMENT OF BILATERAL BREAST IMPLANTS FOR RECONSTRUCTION (Bilateral)  Anesthesia type: general  Patient location: PACU  Post pain: Pain level controlled  Post assessment: Patient's Cardiovascular Status Stable  Last Vitals:  Filed Vitals:   05/17/14 1200  BP: 143/78  Pulse:   Temp:   Resp:     Post vital signs: Reviewed and stable  Level of consciousness: sedated  Complications: No apparent anesthesia complications

## 2014-05-17 NOTE — Anesthesia Procedure Notes (Signed)
Procedure Name: Intubation Date/Time: 05/17/2014 7:54 AM Performed by: Jenne Campus Pre-anesthesia Checklist: Patient identified, Emergency Drugs available, Suction available, Patient being monitored and Timeout performed Patient Re-evaluated:Patient Re-evaluated prior to inductionOxygen Delivery Method: Circle system utilized Preoxygenation: Pre-oxygenation with 100% oxygen Intubation Type: IV induction Ventilation: Mask ventilation without difficulty Laryngoscope Size: Miller and 2 Grade View: Grade III Tube type: Oral Tube size: 7.0 mm Number of attempts: 2 Airway Equipment and Method: Stylet and Video-laryngoscopy Placement Confirmation: positive ETCO2,  CO2 detector and breath sounds checked- equal and bilateral Secured at: 21 cm Tube secured with: Tape Dental Injury: Teeth and Oropharynx as per pre-operative assessment  Difficulty Due To: Difficulty was anticipated, Difficult Airway- due to reduced neck mobility and Difficult Airway- due to limited oral opening Comments: Smooth IV induction. EZ mask. DL x 1 Mil 2 by [facility] CRNA. Grade 3 view. LTA placed with ease. Unable to pass ETT through cords. DL x 1 Dr. Conrad Coeburn. Esophageal intubation immediately recognized. DLx1 Dr. Conrad Soudan video-glide. Grade 2 view. 7.0 ETT passed easily. Atrau OI. +ETCO2 bbse.

## 2014-05-17 NOTE — Transfer of Care (Signed)
Immediate Anesthesia Transfer of Care Note  Patient: Tracey [name]  Procedure(s) Performed: Procedure(s): REMOVAL OF BILATERAL TISSUE EXPANDERS WITH PLACEMENT OF BILATERAL BREAST IMPLANTS FOR RECONSTRUCTION (Bilateral)  Patient Location: PACU  Anesthesia Type:General  Level of Consciousness: awake, oriented and patient cooperative  Airway & Oxygen Therapy: Patient Spontanous Breathing and Patient connected to face mask oxygen  Post-op Assessment: Report given to PACU RN and Post -op Vital signs reviewed and stable  Post vital signs: Reviewed  Complications: No apparent anesthesia complications

## 2014-05-17 NOTE — Brief Op Note (Signed)
05/17/2014  10:00 AM  PATIENT:  Tracey [name]  68 y.o. female  PRE-OPERATIVE DIAGNOSIS:  BREAST CANCER  POST-OPERATIVE DIAGNOSIS:  BREAST CANCER  PROCEDURE:  Procedure(s): REMOVAL OF BILATERAL TISSUE EXPANDERS WITH PLACEMENT OF BILATERAL BREAST IMPLANTS FOR RECONSTRUCTION (Bilateral)  SURGEON:  Surgeon(s) and Role:    * [name], MD - Primary  PHYSICIAN ASSISTANT:   ASSISTANTS: none   ANESTHESIA:   general  EBL:  Total I/O In: 1400 [I.V.:1400] Out: -   BLOOD ADMINISTERED:none  DRAINS: (2) Jackson-Pratt drain(s) with closed bulb suction in the right chest (1) and left chest (1)   LOCAL MEDICATIONS USED:  NONE  SPECIMEN:  No Specimen  DISPOSITION OF SPECIMEN:  N/A  COUNTS:  YES  TOURNIQUET:  * No tourniquets in log *  DICTATION: .Other Dictation: Dictation Number [id number]  PLAN OF CARE: Admit for overnight observation  PATIENT DISPOSITION:  PACU - hemodynamically stable.   Delay start of Pharmacological VTE agent (>24hrs) due to surgical blood loss or risk of bleeding: no

## 2014-05-17 NOTE — H&P (Signed)
I have re-examined and re-evaluated the patient and there are no changes.   See office notes in paper chart for H&P.  Planned procedure: Bilateral removal of tissue expanders and delayed breast reconstruction with silicone gel implants.

## 2014-05-17 NOTE — Anesthesia Preprocedure Evaluation (Addendum)
Anesthesia Evaluation  Patient identified by MRN, date of birth, ID band Patient awake    Reviewed: Allergy & Precautions, H&P , NPO status , Patient's Chart, lab work & pertinent test results  History of Anesthesia Complications (+) PONV and history of anesthetic complications  Airway Mallampati: III TM Distance: <3 FB Neck ROM: Limited    Dental  (+) Teeth Intact, Dental Advisory Given   Pulmonary sleep apnea and Continuous Positive Airway Pressure Ventilation , former smoker,  breath sounds clear to auscultation        Cardiovascular hypertension, Rhythm:Regular Rate:Normal     Neuro/Psych negative neurological ROS     GI/Hepatic Neg liver ROS, GERD-  Medicated and Controlled,  Endo/Other  negative endocrine ROS  Renal/GU negative Renal ROS     Musculoskeletal   Abdominal   Peds  Hematology negative hematology ROS (+)   Anesthesia Other Findings   Reproductive/Obstetrics negative OB ROS                         Anesthesia Physical Anesthesia Plan  ASA: II  Anesthesia Plan: General   Post-op Pain Management:    Induction: Intravenous  Airway Management Planned: Oral ETT  Additional Equipment:   Intra-op Plan:   Post-operative Plan: Extubation in OR  Informed Consent: I have reviewed the patients History and Physical, chart, labs and discussed the procedure including the risks, benefits and alternatives for the proposed anesthesia with the patient or authorized representative who has indicated his/her understanding and acceptance.   Dental advisory given  Plan Discussed with: CRNA, Surgeon and Anesthesiologist  Anesthesia Plan Comments:         Anesthesia Quick Evaluation

## 2014-05-18 DIAGNOSIS — Z421 Encounter for breast reconstruction following mastectomy: Secondary | ICD-10-CM | POA: Diagnosis not present

## 2014-05-18 MED ORDER — METHOCARBAMOL 500 MG PO TABS: 500.0000 mg | ORAL_TABLET | Freq: Four times a day (QID) | ORAL | Status: DC | PRN

## 2014-05-18 MED ORDER — MENTHOL 3 MG MT LOZG: 1.0000 | LOZENGE | OROMUCOSAL | Status: DC | PRN

## 2014-05-18 MED ORDER — HYDROMORPHONE HCL 2 MG PO TABS: 2.0000 mg | ORAL_TABLET | ORAL | Status: DC | PRN

## 2014-05-18 MED ORDER — CEPHALEXIN 500 MG PO CAPS
500.0000 mg | ORAL_CAPSULE | Freq: Three times a day (TID) | ORAL | Status: DC
  Administered 2014-05-18: 500 mg via ORAL
  Filled 2014-05-18: qty 1

## 2014-05-18 MED ORDER — ENOXAPARIN SODIUM 40 MG/0.4ML ~~LOC~~ SOLN: 40.0000 mg | SUBCUTANEOUS | Status: DC

## 2014-05-18 MED ORDER — CEPHALEXIN 500 MG PO CAPS: 500.0000 mg | ORAL_CAPSULE | Freq: Three times a day (TID) | ORAL | Status: DC

## 2014-05-18 NOTE — Discharge Instructions (Addendum)
No lifting for 6 weeks °No vigorous activity for 6 weeks (including outdoor walks) °No driving for 4 weeks °OK to walk up stairs slowly °Stay propped up °Use incentive spirometer at home every hour while awake °No shower while drains are in place °Empty drains at least three times a day and record the amounts separately °Change drain dressings every third day if instructed to do so by Dr. Ladonte Verstraete ° Apply Bacitracin antibiotic ointment to the drain sites ° Place gauze dressing over drains ° Secure the gauze with tape °Take an over-the-counter Probiotic while on antibiotics °Take an over-the-counter stool softener (such as Colace) while on pain medication °See Dr. Itzael Liptak in office next week °For questions call [phone] or [phone] °

## 2014-05-18 NOTE — Discharge Summary (Signed)
Physician Discharge Summary  Patient ID: Tracey [name] MRN: [id number] DOB/AGE: 1945-10-07 68 y.o.  Admit date: 05/17/2014 Discharge date: 05/18/2014  Admission Diagnoses: Right breast cancer and left breast personal history of breast cancer  Discharge Diagnoses: Same Active Problems:   Breast cancer   Discharged Condition: good  Hospital Course: On the day of admission the patient was taken to surgery and had bilateral breast reconstruction with silicone implants. The patient tolerated the procedures well. Postoperatively, the patient was ambulatory and tolerating diet on the first postoperative day. She had prophylactic antibiotics IV and DVT prophylaxis.  Treatments: antibiotics: Ancef, anticoagulation: heparin and surgery: bilateral breast reconstruction with implants  Discharge Exam: Blood pressure 123/63, pulse 72, temperature 98 F (36.7 C), temperature source Oral, resp. rate 17, height 5\' 3"  (1.6 m), weight 149 lb 11.2 oz (67.903 kg), SpO2 98.00%.  Operative sites: Mastectomy flaps viable. Implants in good position at inframammary creases. Left side has a little more fulness than right in the superior pole due to no radiation that side . Drains functioning. Drainage thin.  Disposition: 01-Home or Self Care     Medication List    STOP taking these medications       celecoxib 200 MG capsule  Commonly known as:  CELEBREX     phentermine 37.5 MG capsule      TAKE these medications       allopurinol 100 MG tablet  Commonly known as:  ZYLOPRIM  Take 100 mg by mouth daily.     anastrozole 1 MG tablet  Commonly known as:  ARIMIDEX  TAKE 1 TABLET BY MOUTH EVERY DAY     calcium-vitamin D 250-125 MG-UNIT per tablet  Commonly known as:  OSCAL WITH D  Take 2 tablets by mouth at bedtime.     cephALEXin 500 MG capsule  Commonly known as:  KEFLEX  Take 1 capsule (500 mg total) by mouth every 8 (eight) hours.     enoxaparin 40 MG/0.4ML injection  Commonly known as:   LOVENOX  Inject 0.4 mLs (40 mg total) into the skin daily.     esomeprazole 40 MG capsule  Commonly known as:  NEXIUM  Take 1 capsule (40 mg total) by mouth daily at 12 noon.     fluticasone 50 MCG/ACT nasal spray  Commonly known as:  FLONASE  Place 1 spray into the nose daily as needed for rhinitis.     furosemide 40 MG tablet  Commonly known as:  LASIX  Take 40 mg by mouth daily as needed for fluid.     HYDROmorphone 2 MG tablet  Commonly known as:  DILAUDID  Take 1 tablet (2 mg total) by mouth every 4 (four) hours as needed for moderate pain.     LORazepam 0.5 MG tablet  Commonly known as:  ATIVAN  Take 0.5 mg by mouth at bedtime as needed for anxiety (or sleep).     menthol-cetylpyridinium 3 MG lozenge  Commonly known as:  CEPACOL  Take 1 lozenge (3 mg total) by mouth as needed for sore throat.     methocarbamol 500 MG tablet  Commonly known as:  ROBAXIN  Take 1 tablet (500 mg total) by mouth every 6 (six) hours as needed for muscle spasms.     pravastatin 40 MG tablet  Commonly known as:  PRAVACHOL  Take 1 tablet (40 mg total) by mouth daily.     testosterone cypionate 200 MG/ML injection  Commonly known as:  DEPOTESTOTERONE CYPIONATE  Inject 200  mg into the muscle every 30 (thirty) days. Normally 1st of the month     venlafaxine XR 150 MG 24 hr capsule  Commonly known as:  EFFEXOR-XR  Take 1 capsule (150 mg total) by mouth daily with breakfast.     VITAMIN B-12 IJ  Inject 1,000 mcg as directed every 30 (thirty) days. Normally 1st of the month         Signed: Parris Signer M 05/18/2014, 8:18 AM

## 2014-05-18 NOTE — Progress Notes (Signed)
D/c to home with husband.  Patient and husband demonstrated understanding of instruction and given rx's.  Pain denied breathing regular and unlabored on room air.

## 2014-05-22 ENCOUNTER — Encounter (HOSPITAL_COMMUNITY): Payer: Self-pay | Admitting: Plastic Surgery

## 2014-05-29 NOTE — Telephone Encounter (Signed)
NONE 

## 2014-06-01 ENCOUNTER — Other Ambulatory Visit (HOSPITAL_BASED_OUTPATIENT_CLINIC_OR_DEPARTMENT_OTHER): Payer: Medicare Other

## 2014-06-01 DIAGNOSIS — C50411 Malignant neoplasm of upper-outer quadrant of right female breast: Secondary | ICD-10-CM

## 2014-06-01 LAB — CBC WITH DIFFERENTIAL/PLATELET
BASO%: 0.2 % (ref 0.0–2.0)
Basophils Absolute: 0 10*3/uL (ref 0.0–0.1)
EOS%: 4 % (ref 0.0–7.0)
Eosinophils Absolute: 0.2 10*3/uL (ref 0.0–0.5)
HCT: 40.2 % (ref 34.8–46.6)
HGB: 13.6 g/dL (ref 11.6–15.9)
LYMPH%: 24.2 % (ref 14.0–49.7)
MCH: 30.4 pg (ref 25.1–34.0)
MCHC: 33.8 g/dL (ref 31.5–36.0)
MCV: 89.7 fL (ref 79.5–101.0)
MONO#: 0.2 10*3/uL (ref 0.1–0.9)
MONO%: 4.7 % (ref 0.0–14.0)
NEUT%: 66.9 % (ref 38.4–76.8)
NEUTROS ABS: 3.2 10*3/uL (ref 1.5–6.5)
NRBC: 0 % (ref 0–0)
PLATELETS: 212 10*3/uL (ref 145–400)
RBC: 4.48 10*6/uL (ref 3.70–5.45)
RDW: 13.9 % (ref 11.2–14.5)
WBC: 4.7 10*3/uL (ref 3.9–10.3)
lymph#: 1.1 10*3/uL (ref 0.9–3.3)

## 2014-06-01 LAB — COMPREHENSIVE METABOLIC PANEL (CC13)
ALK PHOS: 109 U/L (ref 40–150)
ALT: 52 U/L (ref 0–55)
ANION GAP: 12 meq/L — AB (ref 3–11)
AST: 32 U/L (ref 5–34)
Albumin: 3.7 g/dL (ref 3.5–5.0)
BILIRUBIN TOTAL: 0.32 mg/dL (ref 0.20–1.20)
BUN: 16.3 mg/dL (ref 7.0–26.0)
CO2: 23 mEq/L (ref 22–29)
Calcium: 9.9 mg/dL (ref 8.4–10.4)
Chloride: 105 mEq/L (ref 98–109)
Creatinine: 1 mg/dL (ref 0.6–1.1)
Glucose: 113 mg/dl (ref 70–140)
Potassium: 3.9 mEq/L (ref 3.5–5.1)
SODIUM: 140 meq/L (ref 136–145)
TOTAL PROTEIN: 6.7 g/dL (ref 6.4–8.3)

## 2014-06-04 ENCOUNTER — Telehealth: Payer: Self-pay | Admitting: Oncology

## 2014-06-04 ENCOUNTER — Ambulatory Visit (HOSPITAL_BASED_OUTPATIENT_CLINIC_OR_DEPARTMENT_OTHER): Payer: Medicare Other | Admitting: Oncology

## 2014-06-04 VITALS — BP 134/64 | HR 98 | Temp 98.0°F | Resp 18 | Ht 63.0 in | Wt 153.0 lb

## 2014-06-04 DIAGNOSIS — Z170 Estrogen receptor positive status [ER+]: Secondary | ICD-10-CM

## 2014-06-04 DIAGNOSIS — C50411 Malignant neoplasm of upper-outer quadrant of right female breast: Secondary | ICD-10-CM | POA: Diagnosis not present

## 2014-06-04 MED ORDER — KETOCONAZOLE 2 % EX CREA
1.0000 | TOPICAL_CREAM | Freq: Every day | CUTANEOUS | Status: DC
Start: 2014-06-04 — End: 2015-04-02

## 2014-06-04 NOTE — Addendum Note (Signed)
Addended by: Renford Dills on: 06/04/2014 03:00 PM   Modules accepted: Medications

## 2014-06-04 NOTE — Telephone Encounter (Signed)
per pof to sch pt appt-gave pt copy of sch °

## 2014-06-04 NOTE — Progress Notes (Signed)
ID: Tracey [name] OB: 02-15-1946  MR#: [id number]  XBD#:[id number]  PCP: Tracey Hatchet, MD/ Tracey [name] GYN:   SU: Tracey [name] OTHER MD: Tracey [name], Tracey [name] [phone])  PCP: Tracey Hatchet, MD GYN: SU: Tracey [name] OTHER MD: Tracey [name]    HISTORY OF PRESENT ILLNESS: From the original intent note:  Tracey [name] had routine screening mammography July of 2014 showing a suspicious mass in her right breast. Additional views confirmed a mass in the upper outer quadrant of the right breast, which by ultrasound measured 4 mm. Biopsy of this mass was obtained in [place], at [facility]. 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  The patient's subsequent history is as detailed below  INTERVAL HISTORY: Tracey [name] returns today for followup of her breast cancer Accompanied by her husband and daughter. the interval history is significant for her having had her permanent implants placed October 1 under Dr. Harlow [name]. She still has her drains in place and they are really bothering her. On the plus side, she got 2 dogs (she had not told her husband about them but he has grown to light them) and they have really "turned her life around."   REVIEW OF SYSTEMS: Tracey [name] wants to know why all her friends say that they are cancer free and wants to know she is cancer free. She wonders if she can use Tracey estrogen cream for vaginal dryness issues. She wonders if calcium would be helpful for various aches and pains she has Tracey area in the right axilla that is painful and she wonders if that could be cancer. She feels forgetful. Hot flashes are a little bit better than before. Otherwise a detailed review of systems today was stable   PAST MEDICAL HISTORY: Past Medical History  Diagnosis Date  . Arthritis   . Gout     takes Allopurinol daily and Colchicine  daily prn;last attack 21yr ago  . Tachycardia     takes Metoprolol daily  . Hyperlipidemia     takes Pravastatin daily  . Gout   . Breast cancer 2014    ER+/PR+/HEr2-,   . Colon polyps     2 polyps by report  . Rhinitis     uses Flonase prn  . Insomnia     takes Ambien nightly prn  . Anxiety     takes Ativan daily prn  . Peripheral edema     takes Furosemide daily prn  . PONV (postoperative nausea and vomiting)   . History of bronchitis     last time at least 845yrago  . Osteoarthritis   . Bruises easily   . GERD (gastroesophageal reflux disease)     occasionally takes Nexium   . Diverticulosis   . History of bladder infections     many yrs ago  . Allergy   . Radiation 07/27/13-09/07/13    Right Breast x 31 treatments  . Ventricular tachycardia, non-sustained     during sleep study 2009 with normal cardiac workup  . OSA (obstructive sleep apnea)     on CPAP  . Sinus congestion   . Postmenopausal hormone therapy   . History of stress incontinence     PAST SURGICAL HISTORY: Past Surgical History  Procedure Laterality Date  . Appendectomy    . Gallbladder surgery    . Cholecystectomy    . Cosmetic surgery    . Tonsillectomy    .  Abdominal hysterectomy    . Cardiac catheterization  1999  . Colonoscopy    . Bilateral cataract surgery    . Urinary tightening      d/t urinary incontinence  . Total mastectomy Bilateral 05/23/2013    WITH RECONSTRUCTION  . Total mastectomy Bilateral 05/23/2013    Procedure: TOTAL MASTECTOMY;  Surgeon: Tracey Hollingshead, MD;  Location: [place];  Service: General;  Laterality: Bilateral;  . Axillary sentinel node biopsy Right 05/23/2013    Procedure: AXILLARY SENTINEL NODE BIOPSY;  Surgeon: Tracey Hollingshead, MD;  Location: [place];  Service: General;  Laterality: Right;  nuc med injection 7:00  . Breast reconstruction with placement of tissue expander and flex hd (acellular hydrated dermis) Bilateral 05/23/2013    Procedure: BILATERAL  BREAST RECONSTRUCTION WITH PLACEMENT OF TISSUE EXPANDER AND FLEX HD;  Surgeon: Tracey Reese, MD;  Location: [place];  Service: Plastics;  Laterality: Bilateral;  . Breast reconstruction Bilateral 05/17/2014    W  . Removal of bilateral tissue expanders with placement of bilateral breast implants Bilateral 05/17/2014    Procedure: REMOVAL OF BILATERAL TISSUE EXPANDERS WITH PLACEMENT OF BILATERAL BREAST IMPLANTS FOR RECONSTRUCTION;  Surgeon: Tracey Reese, MD;  Location: [place];  Service: Plastics;  Laterality: Bilateral;    FAMILY HISTORY Family History  Problem Relation Age of Onset  . Breast cancer Mother     possible inflammatory breast cancer  . Heart attack Father   . Heart disease Father   . ALS Sister   . Breast cancer Maternal Grandmother 101  . Heart attack Maternal Grandfather   . Heart attack Paternal Grandfather   . Breast cancer Maternal Aunt     dx in her 63s  . Breast cancer Other     maternal great grandmother; dx in her 33s   the patient's father died from heart disease at the age of 19. The patient's mother died from breast cancer at the age of 73. It is not clear when she was diagnosed since she "dictated and". The patient had no brothers. One sister died from [place] at the age of 4 in addition, one of the patient's mother is sisters was diagnosed with breast cancer in her 57s. The patient's mother is mother, was also diagnosed with breast cancer, at the age of 15. The patient's daughter, Tracey [name], has been tested for the BRCA genes and was negative.  GYNECOLOGIC HISTORY:  Menarche age 89, first live birth age 52, the patient is [place] P2. She underwent total abdominal hysterectomy and bilateral salpingo-oophorectomy at the age of 26 for endometriosis. She took hormone replacement until August of 2014.  SOCIAL HISTORY:  Tracey [name] recently moved to [facility] and is planning to make this area her "home-base", although they also have a home in [place] where they go for the winter to ski. 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 They are now enjoying their retirement. Daughter Tracey [name] lives in [place]. A younger daughter Tracey [name] is a homemaker in [place]. The patient has 3 grandchildren. She is a Furniture conservator/restorer.    ADVANCED DIRECTIVES: In place   HEALTH MAINTENANCE: History  Substance Use Topics  . Smoking status: Former Smoker -- 1.00 packs/day for 20 years    Start date: 06/09/1992  . Smokeless tobacco: Never Used     Comment: quit at age 66  . Alcohol Use: 3.0 oz/week    5 Glasses of wine per  week     Comment: nightly     Colonoscopy: 2010  PAP: August 2014  Bone density: 2010, normal per patient  Lipid panel:  Allergies  Allergen Reactions  . Ivp Dye [Iodinated Diagnostic Agents] Hives  . Sulfa Antibiotics Swelling    Current Outpatient Prescriptions  Medication Sig Dispense Refill  . allopurinol (ZYLOPRIM) 100 MG tablet Take 100 mg by mouth daily.       Marland Kitchen anastrozole (ARIMIDEX) 1 MG tablet TAKE 1 TABLET BY MOUTH EVERY DAY  30 tablet  5  . calcium-vitamin D (OSCAL WITH D) 250-125 MG-UNIT per tablet Take 2 tablets by mouth at bedtime.       . cephALEXin (KEFLEX) 500 MG capsule Take 1 capsule (500 mg total) by mouth every 8 (eight) hours.  30 capsule  0  . Cyanocobalamin (VITAMIN B-12 IJ) Inject 1,000 mcg as directed every 30 (thirty) days. Normally 1st of the month      . enoxaparin (LOVENOX) 40 MG/0.4ML injection Inject 0.4 mLs (40 mg total) into the skin daily.  12 Syringe  0  . esomeprazole (NEXIUM) 40 MG capsule Take 1 capsule (40 mg total) by mouth daily at 12 noon.  30 capsule  6  . fluticasone (FLONASE) 50 MCG/ACT nasal spray Place 1 spray into the nose daily as needed for rhinitis.       . furosemide (LASIX) 40 MG tablet Take 40 mg by mouth daily as needed for fluid.       Marland Kitchen HYDROmorphone (DILAUDID) 2 MG tablet Take 1 tablet (2 mg total) by mouth every 4 (four)  hours as needed for moderate pain.  30 tablet  0  . LORazepam (ATIVAN) 0.5 MG tablet Take 0.5 mg by mouth at bedtime as needed for anxiety (or sleep).      . menthol-cetylpyridinium (CEPACOL) 3 MG lozenge Take 1 lozenge (3 mg total) by mouth as needed for sore throat.  100 tablet  12  . methocarbamol (ROBAXIN) 500 MG tablet Take 1 tablet (500 mg total) by mouth every 6 (six) hours as needed for muscle spasms.  30 tablet  1  . pravastatin (PRAVACHOL) 40 MG tablet Take 1 tablet (40 mg total) by mouth daily.  90 tablet  4  . testosterone cypionate (DEPOTESTOTERONE CYPIONATE) 200 MG/ML injection Inject 200 mg into the muscle every 30 (thirty) days. Normally 1st of the month      . venlafaxine XR (EFFEXOR-XR) 150 MG 24 hr capsule Take 1 capsule (150 mg total) by mouth daily with breakfast.  90 capsule  4   No current facility-administered medications for this visit.    OBJECTIVE: Middle-aged white woman  who appears uncomfortable  Filed Vitals:   06/04/14 1207  BP: 134/64  Pulse: 98  Temp: 98 F (36.7 C)  Resp: 18     Body mass index is 27.11 kg/(m^2).    ECOG FS:2 - Symptomatic, <50% confined to bed [facility] Weights   06/04/14 1207  Weight: 153 lb (69.4 kg)   Sclerae unicteric,  EOMs intact  Oropharynx clear and moist No cervical or supraclavicular adenopathy Lungs  no rales or rhonchi  Heart regular rate and rhythm Abd soft, nontender, positive bowel sounds MSK no focal spinal tenderness, no upper extremity lymphedema Neuro: non-focal, well-oriented,  positive  affect Breasts: Status post bilateral mastectomies with  implants in place.  the cosmetic result is good. She is concerned because the right breast is a little flatter and smaller than the left. The area in  the right axillae that she is concerned about is very likely a dilated lymph vessel. It is elongated and painful to palpation. It is it is clearly not a mass. This requires only followup.   LAB RESULTS:   Lab Results   Component Value Date   WBC 4.7 06/01/2014   NEUTROABS 3.2 06/01/2014   HGB 13.6 06/01/2014   HCT 40.2 06/01/2014   MCV 89.7 06/01/2014   PLT 212 06/01/2014      Chemistry      Component Value Date/Time   NA 140 06/01/2014 1107   NA 137 05/09/2014 1231   K 3.9 06/01/2014 1107   K 4.0 05/09/2014 1231   CL 99 05/09/2014 1231   CO2 23 06/01/2014 1107   CO2 20 05/09/2014 1231   BUN 16.3 06/01/2014 1107   BUN 14 05/09/2014 1231   CREATININE 1.0 06/01/2014 1107   CREATININE 0.82 05/09/2014 1231      Component Value Date/Time   CALCIUM 9.9 06/01/2014 1107   CALCIUM 9.5 05/09/2014 1231   ALKPHOS 109 06/01/2014 1107   ALKPHOS 71 05/16/2013 1540   AST 32 06/01/2014 1107   AST 23 05/16/2013 1540   ALT 52 06/01/2014 1107   ALT 22 05/16/2013 1540   BILITOT 0.32 06/01/2014 1107   BILITOT 0.4 05/16/2013 1540       STUDIES: No results found.  ASSESSMENT: 68 y.o. BRCA 1 & 2 negative [place] woman   (1)  status post right breast upper outer quadrant biopsy 03/24/2013 for a pT1b cN0, stage IA invasive ductal carcinoma, grade 1, strongly estrogen and progesterone receptor positive, HER-2 negative  (2) status post bilateral mastectomies 05/23/2013 showing:  (a) on the right, a pT1b pN0, stage invasive ductal carcinoma, grade 1, again HER-2 negative  (b) on the left, no evidence of malignancy  (3) Oncotype DX score of 20 predicted a rate of distant recurrence within 10 years of 13% if the patient's only systemic treatment was tamoxifen for 5 years.  (4) a positive margin required adjuvant radiation, completed January 2015 in [place]  (5) status post bilateral implant placement January 2015  (6)  started on anastrozole, mid September 2014, prior to definitive surgery; continued postop; bone density 07/31/2013 was normal    PLAN: Tracey [name] is doing fine as far as her anastrozole is concerned and the plan will be to continue that for 5 years.  She gets a little confused, as so many people  do, with the phrase "cancer free". If all that means is that we don't have evidence of active cancer then that's fine. If it means there is no cancer cells in her body then obviously no one can know that as there is no test for that. If I knew that she was cancer free of course we will be treating her. We clarify that term and also to term survivor and remission, and she understands that she is a cancer survivor and she is in remission. Hopefully this will get the "numbing plate your" cleared up.  She is not satisfied with her reconstruction but she hasn't quite finished it yet and that can be "touched up" afterwards so I recommended patience. In the meantime she cannot wait to get those drains out and now that she is a little bit more compliant and is not so active the drainage appears to be a rapidly decreasing and hopefully she can get the drains out this week.   Otherwise she is going to be leaving for [place] shortly. She  will be back in 6 months. I will see her again in April. I will check a vitamin D level together with her baseline labs at that time but I explained to her that the current data suggests taking a calcium is not really helpful in terms of osteoporosis prevention and since she is having significant side effects from those medications she can stop it.  She knows to call for any problems that may develop before her next visit here.  Chauncey Cruel, MD   06/04/2014 12:18 PM

## 2014-06-12 ENCOUNTER — Telehealth: Payer: Self-pay | Admitting: Gastroenterology

## 2014-06-12 NOTE — Telephone Encounter (Signed)
Left message on machine to call back  

## 2014-06-13 NOTE — Telephone Encounter (Signed)
The pt returned call and was offered all available appt spots and declined any opening.  She states she goes to [place] for the winter and will not be back until April.  I have sent a reminder to call the pt to set that up in Feb.

## 2014-06-13 NOTE — Telephone Encounter (Signed)
Left message on machine to call back  

## 2014-06-18 ENCOUNTER — Encounter (HOSPITAL_COMMUNITY): Payer: Self-pay | Admitting: Plastic Surgery

## 2014-06-18 DIAGNOSIS — Z86010 Personal history of colonic polyps: Secondary | ICD-10-CM | POA: Diagnosis not present

## 2014-06-18 DIAGNOSIS — K219 Gastro-esophageal reflux disease without esophagitis: Secondary | ICD-10-CM | POA: Diagnosis not present

## 2014-06-18 DIAGNOSIS — R140 Abdominal distension (gaseous): Secondary | ICD-10-CM | POA: Diagnosis not present

## 2014-06-18 DIAGNOSIS — Z853 Personal history of malignant neoplasm of breast: Secondary | ICD-10-CM | POA: Diagnosis not present

## 2014-06-22 ENCOUNTER — Other Ambulatory Visit: Payer: Self-pay | Admitting: *Deleted

## 2014-06-22 DIAGNOSIS — C50411 Malignant neoplasm of upper-outer quadrant of right female breast: Secondary | ICD-10-CM

## 2014-06-22 MED ORDER — ANASTROZOLE 1 MG PO TABS: ORAL_TABLET | ORAL | Status: DC

## 2014-06-28 ENCOUNTER — Other Ambulatory Visit: Payer: Self-pay | Admitting: *Deleted

## 2014-06-28 DIAGNOSIS — C50411 Malignant neoplasm of upper-outer quadrant of right female breast: Secondary | ICD-10-CM

## 2014-06-28 MED ORDER — COLCHICINE 0.6 MG PO TABS: 0.6000 mg | ORAL_TABLET | ORAL | Status: DC | PRN

## 2014-06-28 MED ORDER — ANASTROZOLE 1 MG PO TABS: ORAL_TABLET | ORAL | Status: DC

## 2014-07-05 DIAGNOSIS — K2970 Gastritis, unspecified, without bleeding: Secondary | ICD-10-CM | POA: Diagnosis not present

## 2014-07-05 DIAGNOSIS — K449 Diaphragmatic hernia without obstruction or gangrene: Secondary | ICD-10-CM | POA: Diagnosis not present

## 2014-07-05 DIAGNOSIS — K219 Gastro-esophageal reflux disease without esophagitis: Secondary | ICD-10-CM | POA: Diagnosis not present

## 2014-07-05 DIAGNOSIS — Z1211 Encounter for screening for malignant neoplasm of colon: Secondary | ICD-10-CM | POA: Diagnosis not present

## 2014-07-05 DIAGNOSIS — D120 Benign neoplasm of cecum: Secondary | ICD-10-CM | POA: Diagnosis not present

## 2014-07-05 DIAGNOSIS — K319 Disease of stomach and duodenum, unspecified: Secondary | ICD-10-CM | POA: Diagnosis not present

## 2014-07-05 DIAGNOSIS — K635 Polyp of colon: Secondary | ICD-10-CM | POA: Diagnosis not present

## 2014-07-05 DIAGNOSIS — K5730 Diverticulosis of large intestine without perforation or abscess without bleeding: Secondary | ICD-10-CM | POA: Diagnosis not present

## 2014-07-05 DIAGNOSIS — Z86010 Personal history of colonic polyps: Secondary | ICD-10-CM | POA: Diagnosis not present

## 2014-07-05 DIAGNOSIS — D122 Benign neoplasm of ascending colon: Secondary | ICD-10-CM | POA: Diagnosis not present

## 2014-07-05 DIAGNOSIS — K2980 Duodenitis without bleeding: Secondary | ICD-10-CM | POA: Diagnosis not present

## 2014-07-16 DIAGNOSIS — R5382 Chronic fatigue, unspecified: Secondary | ICD-10-CM | POA: Diagnosis not present

## 2014-07-25 DIAGNOSIS — Z23 Encounter for immunization: Secondary | ICD-10-CM | POA: Diagnosis not present

## 2014-07-28 NOTE — Telephone Encounter (Signed)
none

## 2014-09-06 DIAGNOSIS — J40 Bronchitis, not specified as acute or chronic: Secondary | ICD-10-CM | POA: Diagnosis not present

## 2014-09-06 DIAGNOSIS — J329 Chronic sinusitis, unspecified: Secondary | ICD-10-CM | POA: Diagnosis not present

## 2014-09-20 ENCOUNTER — Other Ambulatory Visit: Payer: Self-pay | Admitting: Cardiology

## 2014-09-24 DIAGNOSIS — Z9010 Acquired absence of unspecified breast and nipple: Secondary | ICD-10-CM | POA: Diagnosis not present

## 2014-09-24 DIAGNOSIS — Z853 Personal history of malignant neoplasm of breast: Secondary | ICD-10-CM | POA: Diagnosis not present

## 2014-09-24 DIAGNOSIS — M954 Acquired deformity of chest and rib: Secondary | ICD-10-CM | POA: Diagnosis not present

## 2014-09-24 DIAGNOSIS — T8544XA Capsular contracture of breast implant, initial encounter: Secondary | ICD-10-CM | POA: Diagnosis not present

## 2014-09-25 DIAGNOSIS — Z9011 Acquired absence of right breast and nipple: Secondary | ICD-10-CM | POA: Diagnosis not present

## 2014-09-26 ENCOUNTER — Other Ambulatory Visit: Payer: Self-pay | Admitting: Oncology

## 2014-09-27 ENCOUNTER — Other Ambulatory Visit: Payer: Self-pay | Admitting: *Deleted

## 2014-09-27 DIAGNOSIS — C50411 Malignant neoplasm of upper-outer quadrant of right female breast: Secondary | ICD-10-CM

## 2014-09-27 DIAGNOSIS — M109 Gout, unspecified: Secondary | ICD-10-CM

## 2014-09-27 MED ORDER — ALLOPURINOL 100 MG PO TABS: 100.0000 mg | ORAL_TABLET | Freq: Every day | ORAL | Status: DC

## 2014-09-28 DIAGNOSIS — Z01811 Encounter for preprocedural respiratory examination: Secondary | ICD-10-CM | POA: Diagnosis not present

## 2014-09-28 DIAGNOSIS — C50919 Malignant neoplasm of unspecified site of unspecified female breast: Secondary | ICD-10-CM | POA: Diagnosis not present

## 2014-09-28 DIAGNOSIS — K760 Fatty (change of) liver, not elsewhere classified: Secondary | ICD-10-CM | POA: Diagnosis not present

## 2014-09-28 DIAGNOSIS — Z421 Encounter for breast reconstruction following mastectomy: Secondary | ICD-10-CM | POA: Diagnosis not present

## 2014-09-28 DIAGNOSIS — Z01818 Encounter for other preprocedural examination: Secondary | ICD-10-CM | POA: Diagnosis not present

## 2014-09-28 DIAGNOSIS — Z853 Personal history of malignant neoplasm of breast: Secondary | ICD-10-CM | POA: Diagnosis not present

## 2014-09-28 DIAGNOSIS — Z9013 Acquired absence of bilateral breasts and nipples: Secondary | ICD-10-CM | POA: Diagnosis not present

## 2014-10-10 DIAGNOSIS — Z9011 Acquired absence of right breast and nipple: Secondary | ICD-10-CM | POA: Diagnosis not present

## 2014-10-15 ENCOUNTER — Telehealth: Payer: Self-pay

## 2014-10-15 DIAGNOSIS — N651 Disproportion of reconstructed breast: Secondary | ICD-10-CM | POA: Diagnosis not present

## 2014-10-15 DIAGNOSIS — Z90710 Acquired absence of both cervix and uterus: Secondary | ICD-10-CM | POA: Diagnosis not present

## 2014-10-15 DIAGNOSIS — Z9889 Other specified postprocedural states: Secondary | ICD-10-CM

## 2014-10-15 DIAGNOSIS — T8589XA Other specified complication of internal prosthetic devices, implants and grafts, not elsewhere classified, initial encounter: Secondary | ICD-10-CM | POA: Diagnosis present

## 2014-10-15 DIAGNOSIS — G4730 Sleep apnea, unspecified: Secondary | ICD-10-CM | POA: Diagnosis present

## 2014-10-15 DIAGNOSIS — N650 Deformity of reconstructed breast: Secondary | ICD-10-CM | POA: Diagnosis not present

## 2014-10-15 DIAGNOSIS — Z9011 Acquired absence of right breast and nipple: Secondary | ICD-10-CM | POA: Diagnosis not present

## 2014-10-15 DIAGNOSIS — Z888 Allergy status to other drugs, medicaments and biological substances status: Secondary | ICD-10-CM | POA: Diagnosis not present

## 2014-10-15 DIAGNOSIS — K219 Gastro-esophageal reflux disease without esophagitis: Secondary | ICD-10-CM | POA: Diagnosis present

## 2014-10-15 DIAGNOSIS — Z853 Personal history of malignant neoplasm of breast: Secondary | ICD-10-CM | POA: Diagnosis not present

## 2014-10-15 DIAGNOSIS — Z9013 Acquired absence of bilateral breasts and nipples: Secondary | ICD-10-CM | POA: Diagnosis not present

## 2014-10-15 DIAGNOSIS — Z882 Allergy status to sulfonamides status: Secondary | ICD-10-CM | POA: Diagnosis not present

## 2014-10-15 DIAGNOSIS — Z87891 Personal history of nicotine dependence: Secondary | ICD-10-CM | POA: Diagnosis not present

## 2014-10-15 HISTORY — DX: Other specified postprocedural states: Z98.890

## 2014-10-15 NOTE — Telephone Encounter (Signed)
-----   Message from Barron Alvine, [place] sent at 06/13/2014  9:55 AM EDT ----- Pt needs endo colon, would like to schedule in April

## 2014-10-16 NOTE — Telephone Encounter (Signed)
Pt states she had the procedure done at another facility.

## 2014-10-29 ENCOUNTER — Encounter: Payer: Self-pay | Admitting: Cardiology

## 2014-11-01 ENCOUNTER — Other Ambulatory Visit: Payer: Self-pay | Admitting: Oncology

## 2014-11-02 NOTE — Telephone Encounter (Signed)
Please send future refills to primary MD - Dr [name] at [facility]- thank you

## 2014-11-21 DIAGNOSIS — Z9013 Acquired absence of bilateral breasts and nipples: Secondary | ICD-10-CM | POA: Diagnosis not present

## 2014-11-21 DIAGNOSIS — Z853 Personal history of malignant neoplasm of breast: Secondary | ICD-10-CM | POA: Diagnosis not present

## 2014-11-21 DIAGNOSIS — N650 Deformity of reconstructed breast: Secondary | ICD-10-CM | POA: Diagnosis not present

## 2014-11-27 ENCOUNTER — Telehealth: Payer: Self-pay | Admitting: *Deleted

## 2014-11-27 NOTE — Telephone Encounter (Signed)
Patient called reporting she is in [place] and will not return until after Dec 18, 2014.  Needs to reschedule lab and F/U visit.  Message sent to scheduler.  Patient's return mobile number is [phone].

## 2014-11-28 ENCOUNTER — Telehealth: Payer: Self-pay | Admitting: Oncology

## 2014-11-28 NOTE — Telephone Encounter (Signed)
Left message to confirm appointment for May. Mailed calendar.

## 2014-11-30 DIAGNOSIS — M71341 Other bursal cyst, right hand: Secondary | ICD-10-CM | POA: Diagnosis not present

## 2014-11-30 DIAGNOSIS — L570 Actinic keratosis: Secondary | ICD-10-CM | POA: Diagnosis not present

## 2014-11-30 DIAGNOSIS — D225 Melanocytic nevi of trunk: Secondary | ICD-10-CM | POA: Diagnosis not present

## 2014-11-30 DIAGNOSIS — L578 Other skin changes due to chronic exposure to nonionizing radiation: Secondary | ICD-10-CM | POA: Diagnosis not present

## 2014-11-30 DIAGNOSIS — L821 Other seborrheic keratosis: Secondary | ICD-10-CM | POA: Diagnosis not present

## 2014-12-04 ENCOUNTER — Other Ambulatory Visit: Payer: Medicare Other

## 2014-12-06 DIAGNOSIS — Z853 Personal history of malignant neoplasm of breast: Secondary | ICD-10-CM | POA: Diagnosis not present

## 2014-12-06 DIAGNOSIS — Z9013 Acquired absence of bilateral breasts and nipples: Secondary | ICD-10-CM | POA: Diagnosis not present

## 2014-12-06 DIAGNOSIS — N650 Deformity of reconstructed breast: Secondary | ICD-10-CM | POA: Diagnosis not present

## 2014-12-06 DIAGNOSIS — Z923 Personal history of irradiation: Secondary | ICD-10-CM | POA: Diagnosis not present

## 2014-12-06 DIAGNOSIS — N651 Disproportion of reconstructed breast: Secondary | ICD-10-CM | POA: Diagnosis not present

## 2014-12-06 DIAGNOSIS — Z87891 Personal history of nicotine dependence: Secondary | ICD-10-CM | POA: Diagnosis not present

## 2014-12-07 DIAGNOSIS — L570 Actinic keratosis: Secondary | ICD-10-CM | POA: Diagnosis not present

## 2014-12-07 DIAGNOSIS — D229 Melanocytic nevi, unspecified: Secondary | ICD-10-CM | POA: Diagnosis not present

## 2014-12-07 DIAGNOSIS — L821 Other seborrheic keratosis: Secondary | ICD-10-CM | POA: Diagnosis not present

## 2014-12-10 ENCOUNTER — Ambulatory Visit: Payer: Medicare Other | Admitting: Oncology

## 2014-12-20 ENCOUNTER — Ambulatory Visit: Payer: Medicare Other | Attending: Plastic and Reconstructive Surgery | Admitting: Physical Therapy

## 2014-12-20 ENCOUNTER — Encounter: Payer: Self-pay | Admitting: Physical Therapy

## 2014-12-20 DIAGNOSIS — M25511 Pain in right shoulder: Secondary | ICD-10-CM | POA: Diagnosis not present

## 2014-12-20 DIAGNOSIS — Z9889 Other specified postprocedural states: Secondary | ICD-10-CM

## 2014-12-20 DIAGNOSIS — R222 Localized swelling, mass and lump, trunk: Secondary | ICD-10-CM | POA: Diagnosis not present

## 2014-12-20 DIAGNOSIS — M25611 Stiffness of right shoulder, not elsewhere classified: Secondary | ICD-10-CM

## 2014-12-20 NOTE — Therapy (Signed)
[place], [place], [address] Phone: [phone]   Fax:  [phone]  Physical Therapy Evaluation  Patient Details  Name: Tracey [name] MRN: [id number] Date of Birth: September 08, 1945 Referring Provider:  Chauncey Cruel, MD  Encounter Date: 12/20/2014      PT End of Session - 12/20/14 1058    Visit Number 1   Number of Visits 16   Date for PT Re-Evaluation 02/14/15   PT Start Time 1018   PT Stop Time 1100   PT Time Calculation (min) 42 min   Activity Tolerance Patient tolerated treatment well   Behavior During Therapy [facility] for tasks assessed/performed      Past Medical History  Diagnosis Date  . Arthritis   . Gout     takes Allopurinol daily and Colchicine daily prn;last attack 69yr ago  . Tachycardia     takes Metoprolol daily  . Hyperlipidemia     takes Pravastatin daily  . Gout   . Breast cancer 2014    ER+/PR+/HEr2-,   . Colon polyps     2 polyps by report  . Rhinitis     uses Flonase prn  . Insomnia     takes Ambien nightly prn  . Anxiety     takes Ativan daily prn  . Peripheral edema     takes Furosemide daily prn  . PONV (postoperative nausea and vomiting)   . History of bronchitis     last time at least 870yrago  . Osteoarthritis   . Bruises easily   . GERD (gastroesophageal reflux disease)     occasionally takes Nexium   . Diverticulosis   . History of bladder infections     many yrs ago  . Allergy   . Radiation 07/27/13-09/07/13    Right Breast x 31 treatments  . Ventricular tachycardia, non-sustained     during sleep study 2009 with normal cardiac workup  . OSA (obstructive sleep apnea)     on CPAP  . Sinus congestion   . Postmenopausal hormone therapy   . History of stress incontinence   . Status post breast reconstruction 10/15/14    Bilateral implant removal and DIEP performed in [place], [place]    Past Surgical History  Procedure Laterality Date  . Appendectomy     . Gallbladder surgery    . Cholecystectomy    . Cosmetic surgery    . Tonsillectomy    . Abdominal hysterectomy    . Cardiac catheterization  1999  . Colonoscopy    . Bilateral cataract surgery    . Urinary tightening      d/t urinary incontinence  . Total mastectomy Bilateral 05/23/2013    WITH RECONSTRUCTION  . Total mastectomy Bilateral 05/23/2013    Procedure: TOTAL MASTECTOMY;  Surgeon: ToOdis HollingsheadMD;  [facility]: General;  Laterality: Bilateral;  . Axillary sentinel node biopsy Right 05/23/2013    Procedure: AXILLARY SENTINEL NODE BIOPSY;  Surgeon: ToOdis HollingsheadMD;  [facility] Service: General;  Laterality: Right;  nuc med injection 7:00  . Breast reconstruction with placement of tissue expander and flex hd (acellular hydrated dermis) Bilateral 05/23/2013    Procedure: BILATERAL BREAST RECONSTRUCTION WITH PLACEMENT OF TISSUE EXPANDER AND FLEX HD;  Surgeon: DaCrissie ReeseMD;  [facility] Service: Plastics;  Laterality: Bilateral;  . Breast reconstruction Bilateral 05/17/2014    W  . Removal of bilateral tissue expanders with placement  of bilateral breast implants Bilateral 05/17/2014    Procedure: REMOVAL OF BILATERAL TISSUE EXPANDERS WITH PLACEMENT OF BILATERAL BREAST IMPLANTS FOR RECONSTRUCTION;  Surgeon: [name], MD;  Location: [place];  Service: Plastics;  Laterality: Bilateral;    There were no vitals filed for this visit.  Visit Diagnosis:  Status post bilateral breast reconstruction  Decreased right shoulder range of motion  Swelling in chest      Subjective Assessment - 12/20/14 1025    Subjective Patient underwent a bilateral mastectomy for right breast cancer on 05/23/13 with expanders placed for immediate reconstruction.  She had bilateral implants placed 1 year later after radiation.  She had multiple problems with that reconstruction including poor cosmetic outcomes and right implant was "coming out" and skin was  seperating.  She underwent a DIEP TRAM reconstruction bilaterally in February 2016.  She reports since that time, she has had difficulty moving her right shoulder.  She had nipple reconstruction on 12/11/14 and she reports some fat was added around her breasts.  She will have nipple tatooing done in November 2016.   Pertinent History See above.   Patient Stated Goals Be able to move her right arm with greater ease.  Return to full normal golf game.   Currently in Pain? No/denies            [facility] PT Assessment - 12/20/14 0001    Assessment   Medical Diagnosis s/p bilateral breast reconstruction with DIEP   Onset Date 10/15/14   Next MD Visit 12/24/14  Dr. Zella Richer surgeon in [place] & Dr. Jana Hakim 01/09/15   Precautions   Precautions Other (comment)  Prior history of right breast cancer; rt UE lymphedema risk   Restrictions   Weight Bearing Restrictions No   Balance Screen   Has the patient fallen in the past 6 months No   Has the patient had a decrease in activity level because of a fear of falling?  No   Is the patient reluctant to leave their home because of a fear of falling?  No   Home Environment   Living Enviornment Private residence   Living Arrangements Spouse/significant other   Available Help at Discharge Family   Prior Function   Level of Independence Independent with basic ADLs   Vocation Retired   Leisure Has not returned t exercise   Cognition   Overall Cognitive Status Within Functional Limits for tasks assessed   Posture/Postural Control   Posture/Postural Control Postural limitations   Postural Limitations Rounded Shoulders;Forward head   ROM / Strength   AROM / PROM / Strength AROM;Strength   AROM   AROM Assessment Site Shoulder   Right/Left Shoulder Right;Left   Right Shoulder Extension 43 Degrees   Right Shoulder Flexion 140 Degrees   Right Shoulder ABduction 153 Degrees   Right Shoulder Internal Rotation 26 Degrees   Right Shoulder External  Rotation 64 Degrees   Left Shoulder Extension 65 Degrees   Left Shoulder Flexion 152 Degrees   Left Shoulder ABduction 160 Degrees   Left Shoulder Internal Rotation 70 Degrees   Left Shoulder External Rotation 90 Degrees   Strength   Strength Assessment Site Shoulder   Right/Left Shoulder Right;Left   Right Shoulder Flexion 4/5  painful   Right Shoulder Extension 5/5   Right Shoulder ABduction 5/5   Right Shoulder Internal Rotation 5/5   Right Shoulder External Rotation 4/5  Painful   Left Shoulder Flexion 5/5   Left Shoulder Extension 5/5   Left Shoulder  ABduction 5/5   Left Shoulder Internal Rotation 5/5   Left Shoulder External Rotation 5/5   Palpation   Palpation Tender to palpation right upper chest at lateral region; fluid pocket visible near right pectoralis insertion           LYMPHEDEMA/ONCOLOGY QUESTIONNAIRE - 12/20/14 1049    Type   Cancer Type Right breast   Surgeries   Mastectomy Date 05/23/13   Tram Date 10/15/14   Saline Implant Reconstruction Date 05/23/14   Sentinel Lymph Node Biopsy Date 05/23/13   Number Lymph Nodes Removed 5   Treatment   Past Radiation Treatment Yes  completed on this date   Date 09/08/13   Body Site right breast   Current Hormone Treatment Yes   Drug Name Arimidex   What other symptoms do you have   Are you Having Heaviness or Tightness Yes   Are you having Pain No  Some tenderness in right pectoralis insertion region   Are you having pitting edema No   Is it Hard or Difficult finding clothes that fit No   Do you have infections No   Is there Decreased scar mobility Yes   Stemmer Sign No   Lymphedema Assessments   Lymphedema Assessments Upper extremities   Right Upper Extremity Lymphedema   10 cm Proximal to Olecranon Process 25.1 cm   Olecranon Process 23.1 cm   10 cm Proximal to Ulnar Styloid Process 21.5 cm   Just Proximal to Ulnar Styloid Process 13.9 cm   Across Hand at [employer] 18.3 cm   At [place] of 2nd  Digit 6.9 cm   Left Upper Extremity Lymphedema   10 cm Proximal to Olecranon Process 25.8 cm   Olecranon Process 22.8 cm   10 cm Proximal to Ulnar Styloid Process 20.5 cm   Just Proximal to Ulnar Styloid Process 13.4 cm   Across Hand at [employer] 17.4 cm   At [place] of 2nd Digit 6 cm           Quick Dash - 12/20/14 0001    Open a tight or new jar Severe difficulty   Do heavy household chores (wash walls, wash floors) Moderate difficulty   Carry a shopping bag or briefcase Mild difficulty   Wash your back Mild difficulty   Use a knife to cut food No difficulty   Recreational activities in which you take some force or impact through your arm, shoulder, or hand (golf, hammering, tennis) Mild difficulty   During the past week, to what extent has your arm, shoulder or hand problem interfered with your normal social activities with family, friends, neighbors, or groups? Slightly   During the past week, to what extent has your arm, shoulder or hand problem limited your work or other regular daily activities Slightly   Arm, shoulder, or hand pain. Moderate   Tingling (pins and needles) in your arm, shoulder, or hand Moderate   Difficulty Sleeping Moderate difficulty   DASH Score 36.36 %            PT Education - 12/20/14 1200    Education provided Yes   Education Details Shoulder ROM HEP   Person(s) Educated Patient   Methods Explanation;Handout   Comprehension Verbalized understanding           Short Term Clinic Goals - 12/20/14 1211    CC Short Term Goal  #1   Title Patient will be able to demonstrate initial home exercise program safely  Time 4   Period Weeks   Status New   CC Short Term Goal  #2   Title Patient will be able to increase right shoulder active flexion to >/= 145 degrees for increased ease with reaching   Time 4   Period Weeks   Status New   CC Short Term Goal  #3   Title Patient will be able to increase right shoulder active internal rotation  to >/= 50 degrees for increased ease scratching her back   Time 4   Period Weeks   Status New   CC Short Term Goal  #4   Title Patient will be able to report >/= 25% improvement in right chest swelling for increased comfort and improved cosmesis   Time 4   Period Weeks   Status New             Long Term Clinic Goals - 2015-01-16 1214    CC Long Term Goal  #1   Title Patient will be able to increase right shoulder active flexion to >/= 155 degrees for increased ease withr reaching   Time 8   Period Weeks   Status New   CC Long Term Goal  #2   Title Patient will be able to increase right shoulder active internal rotation to >/= 60 degrees for increased ease scratching her back   Time 8   Period Weeks   Status New   CC Long Term Goal  #3   Title Patient will be able to report >/= 50% less swelling present in her right chest for incerased comfort and improved cosmesis.   Time 8   Period Weeks   Status New            Plan - Jan 16, 2015 1200/10/13    Clinical Impression Statement Patient is a pleasant 69 y.o. woman who was treated here previously s/p bilalteral mastectomies with reconstruction.  She was displeased with her cosmetic outcome and seeked a second opinion in [place].  They decided to remove the implants (she reports the left one was put in upside down and the right one was coming through her skin in the front of her breast).  She had DIEP flap reconstructions performed bilaterally and is very pleased with her outcomes.  Her primary complaints at this point are difficulty moving her right shoulder and a pocket of fluid present in her right pectoralis insertion area.    Pt will benefit from skilled therapeutic intervention in order to improve on the following deficits Decreased strength;Impaired UE functional use;Increased edema;Decreased range of motion   Rehab Potential Excellent   Clinical Impairments Affecting Rehab Potential none   PT Frequency 3x / week   PT Duration 8  weeks   PT Next Visit Plan PROM right shoulder; manual lymph drainage to address right upper chest swelling   Consulted and Agree with Plan of Care Patient          G-Codes - 2015/01/16 1215-10-14    Functional Assessment Tool Used Quick DASH - scored 36.36   Functional Limitation Carrying, moving and handling objects   Carrying, Moving and Handling Objects Current Status (Z3007) At least 20 percent but less than 40 percent impaired, limited or restricted   Carrying, Moving and Handling Objects Goal Status (M2263) 0 percent impaired, limited or restricted       Problem List Patient Active Problem List   Diagnosis Date Noted  . CAD (coronary artery disease), native coronary artery 01/01/2014  . Chest  pain 01/01/2014  . Hypertension   . Ventricular tachycardia, non-sustained   . OSA (obstructive sleep apnea)   . Breast cancer of upper-outer quadrant of right female breast 06/28/2013  . Postoperative visit 06/02/2013  . Gout   . Tachycardia     ,MARTI COOPER, PT 12/20/2014, 12:20 PM  [place], [place], [address] Phone: [phone]   Fax:  [phone]

## 2014-12-20 NOTE — Patient Instructions (Signed)
Reissued original HEP given for post-op after mastectomy since pt reported losing the exercises (bilateral AAROM shoulder flexion, scapular retraction, walking walking into abduction, ER hands behind head.

## 2014-12-24 ENCOUNTER — Encounter: Payer: Self-pay | Admitting: General Surgery

## 2014-12-24 ENCOUNTER — Ambulatory Visit: Payer: Medicare Other

## 2014-12-24 DIAGNOSIS — R222 Localized swelling, mass and lump, trunk: Secondary | ICD-10-CM | POA: Diagnosis not present

## 2014-12-24 DIAGNOSIS — M25611 Stiffness of right shoulder, not elsewhere classified: Secondary | ICD-10-CM

## 2014-12-24 DIAGNOSIS — M25511 Pain in right shoulder: Secondary | ICD-10-CM | POA: Diagnosis not present

## 2014-12-24 DIAGNOSIS — Z9889 Other specified postprocedural states: Secondary | ICD-10-CM | POA: Diagnosis not present

## 2014-12-24 DIAGNOSIS — Z853 Personal history of malignant neoplasm of breast: Secondary | ICD-10-CM | POA: Diagnosis not present

## 2014-12-24 NOTE — Progress Notes (Unsigned)
Tracey [name] 12/24/2014 12:33 PM Location: [facility] Patient #: [id number] DOB: 1945/12/27 Married / Language: Tracey [name] / Race: White Female History of Present Illness Tracey [name]; 12/24/2014 3:04 PM) Patient words: 6 mo f/u breast cancer.  The patient is a 69 year old female    Note:Procedure: Bilateral mastectomies, right axillary sentinel lymph node biopsy, reconstruction with tissue expanders.  Date: 05/23/2013  Pathology: Invasive right breast cancer extending to one of the anterior tissue margins in the upper flap region, precise site unable to be determined. Sentinel lymph nodes negative for tumor. Estrogen receptor positive.  History: She is here for a followup visit for breast cancer. Since I saw her last she had her tissue expanders removed and silicon implants placed in October 2015. She had problems with these and underwent surgery in [facility] 10/15/14. The operation included bilateral deep inferior epigastric artery perforator flap, bilateral implant removal, bilateral capsulectomy, bilateral shoulder muscle advancement for a threatened implant exposure on the right side. She also reports that she had asymmetry of her left implant. I have a copy of the operative notes. Reconstruction was done with autologous abdominal wall tissue. She's had nipple and areola reconstruction as well. She is very happy with this result thus far. Exam: General- Is in NAD.  Chest wall- no palpable chest wall nodules on the right or left. Right-sided nipple reconstruction looks good. Left side has a little bit of scab inferiorly but has no drainage or redness.  Abdomen-soft, 1 cm subcutaneous nodule in the right mid abdomen. Lower transverse incision is clean.  Lymph nodes-no palpable cervical, supraclavicular, or axillary adenopathy    Other Problems Tracey [name], CMA; 12/24/2014 12:33 PM) Arthritis Breast Cancer Gastroesophageal Reflux  Disease General anesthesia - complications Sleep Apnea  Past Surgical History Tracey [name], [place]; 12/24/2014 12:33 PM) Appendectomy Breast Reconstruction Bilateral. Cataract Surgery Bilateral. Gallbladder Surgery - Laparoscopic Hysterectomy (not due to cancer) - Complete Mastectomy Bilateral. Resection of Stomach Sentinel Lymph Node Biopsy Tonsillectomy  Diagnostic Studies History Tracey [name], CMA; 12/24/2014 12:33 PM) Colonoscopy within last year Pap Smear 1-5 years ago  Allergies Tracey [name], CMA; 12/24/2014 1:42 PM) Sulfabenzamide *CHEMICALS* Swelling.  Medication History Tracey [name], CMA; 12/24/2014 1:32 PM) Zyloprim (100MG  Tablet, 1 Oral daily) Active. Arimidex (1MG  Tablet, 1 Oral daily) Active. Keflex (500MG  Capsule, Oral as directed) Active. Colchicine (0.6MG  Capsule, Oral prn) Active. NexIUM (40MG  Capsule DR, Oral prn) Active. Flonase (50MCG/DOSE Inhaler, Nasal as directed) Active. Ativan (0.5MG  Tablet, Oral as directed) Active. Pravachol (40MG  Tablet, Oral as directed) Active. Testosterone Cypionate (200MG /ML Solution, Intramuscular as directed) Active. Lasix (40MG  Tablet, Oral as directed) Active.  Social History Tracey [name], [place]; 12/24/2014 12:33 PM) Alcohol use Occasional alcohol use. Caffeine use Coffee. No drug use Tobacco use Former smoker.  Family History Tracey [name], [place]; 12/24/2014 12:33 PM) Breast Cancer Mother. Heart Disease Father. Thyroid problems Daughter.  Pregnancy / Birth History Tracey [name], [place]; 12/24/2014 12:33 PM) Age at menarche 2 years. Gravida 2 Maternal age 70-25 Para 2     Review of Systems (Tracey [name]; 12/24/2014 12:33 PM) General Not Present- Appetite Loss, Chills, Fatigue, Fever, Night Sweats, Weight Gain and Weight Loss. Skin Not Present- Change in Wart/Mole, Dryness, Hives, Jaundice, New Lesions, Non-Healing Wounds, Rash and Ulcer. HEENT Not Present- Earache, Hearing Loss,  Hoarseness, Nose Bleed, Oral Ulcers, Ringing in the Ears, Seasonal Allergies, Sinus Pain, Sore Throat, Visual Disturbances, Wears glasses/contact lenses and Yellow Eyes. Respiratory Not Present- Bloody sputum, Chronic Cough, Difficulty Breathing, Snoring and  Wheezing. Gastrointestinal Not Present- Abdominal Pain, Bloating, Bloody Stool, Change in Bowel Habits, Chronic diarrhea, Constipation, Difficulty Swallowing, Excessive gas, Gets full quickly at meals, Hemorrhoids, Indigestion, Nausea, Rectal Pain and Vomiting. Female Genitourinary Not Present- Frequency, Nocturia, Painful Urination, Pelvic Pain and Urgency. Musculoskeletal Present- Joint Stiffness. Not Present- Back Pain, Joint Pain, Muscle Pain, Muscle Weakness and Swelling of Extremities. Neurological Not Present- Decreased Memory, Fainting, Headaches, Numbness, Seizures, Tingling, Tremor, Trouble walking and Weakness. Psychiatric Not Present- Anxiety, Bipolar, Change in Sleep Pattern, Depression, Fearful and Frequent crying. Endocrine Not Present- Cold Intolerance, Excessive Hunger, Hair Changes, Heat Intolerance, Hot flashes and New Diabetes. Hematology Not Present- Easy Bruising, Excessive bleeding, Gland problems, HIV and Persistent Infections.  Vitals Tracey [name] CMA; 12/24/2014 1:42 PM) 12/24/2014 1:42 PM Weight: 141.4 lb Height: 63in Body Surface Area: 1.69 m Body Mass Index: 25.05 kg/m Temp.: 97.67F(Oral)  Pulse: 84 (Regular)  Resp.: 18 (Unlabored)  BP: 162/90 (Sitting, Left Arm, Standard)     Assessment & Plan Tracey [name]; 12/24/2014 3:05 PM)  HISTORY OF CANCER OF RIGHT BREAST (V10.3  Z85.3) Impression: Assessment: Invasive right breast cancer with close or positive superior skin flap margin status post bilateral mastectomies and XRT-There is no clinical evidence of recurrence. No chest wall nodules. She has a 1 cm abdominal wall subcutaneous nodule but this may be related to scar tissue.  Plan:  Return visit in 6 months. Call if the abdominal wall nodule gets larger.  Tracey [name]

## 2014-12-24 NOTE — Therapy (Signed)
[place], [place], [address] Phone: [phone]   Fax:  [phone]  Physical Therapy Treatment  Patient Details  Name: Tracey [name] MRN: [id number] Date of Birth: 04-Oct-1945 Referring Provider:  [name], MD  Encounter Date: 12/24/2014      PT End of Session - 12/24/14 1105    PT Start Time 1028   PT Stop Time 1107   PT Time Calculation (min) 39 min      Past Medical History  Diagnosis Date  . Arthritis   . Gout     takes Allopurinol daily and Colchicine daily prn;last attack 54yr ago  . Tachycardia     takes Metoprolol daily  . Hyperlipidemia     takes Pravastatin daily  . Gout   . Breast cancer 2014    ER+/PR+/HEr2-,   . Colon polyps     2 polyps by report  . Rhinitis     uses Flonase prn  . Insomnia     takes Ambien nightly prn  . Anxiety     takes Ativan daily prn  . Peripheral edema     takes Furosemide daily prn  . PONV (postoperative nausea and vomiting)   . History of bronchitis     last time at least 89yrago  . Osteoarthritis   . Bruises easily   . GERD (gastroesophageal reflux disease)     occasionally takes Nexium   . Diverticulosis   . History of bladder infections     many yrs ago  . Allergy   . Radiation 07/27/13-09/07/13    Right Breast x 31 treatments  . Ventricular tachycardia, non-sustained     during sleep study 2009 with normal cardiac workup  . OSA (obstructive sleep apnea)     on CPAP  . Sinus congestion   . Postmenopausal hormone therapy   . History of stress incontinence   . Status post breast reconstruction 10/15/14    Bilateral implant removal and DIEP performed in [place]    Past Surgical History  Procedure Laterality Date  . Appendectomy    . Gallbladder surgery    . Cholecystectomy    . Cosmetic surgery    . Tonsillectomy    . Abdominal hysterectomy    . Cardiac catheterization  1999  . Colonoscopy    . Bilateral cataract  surgery    . Urinary tightening      d/t urinary incontinence  . Total mastectomy Bilateral 05/23/2013    WITH RECONSTRUCTION  . Total mastectomy Bilateral 05/23/2013    Procedure: TOTAL MASTECTOMY;  Surgeon: ToOdis HollingsheadMD;  [facility] [facility];  Laterality: Bilateral;  . Axillary sentinel node biopsy Right 05/23/2013    Procedure: AXILLARY SENTINEL NODE BIOPSY;  Surgeon: ToOdis HollingsheadMD;  [facility]: General;  Laterality: Right;  nuc med injection 7:00  . Breast reconstruction with placement of tissue expander and flex hd (acellular hydrated dermis) Bilateral 05/23/2013    Procedure: BILATERAL BREAST RECONSTRUCTION WITH PLACEMENT OF TISSUE EXPANDER AND FLEX HD;  Surgeon: DaCrissie ReeseMD;  [facility]: [facility];  Laterality: Bilateral;  . Breast reconstruction Bilateral 05/17/2014    W  . Removal of bilateral tissue expanders with placement of bilateral breast implants Bilateral 05/17/2014    Procedure: REMOVAL OF BILATERAL TISSUE EXPANDERS WITH PLACEMENT OF BILATERAL BREAST IMPLANTS FOR RECONSTRUCTION;  Surgeon: DaCrissie ReeseMD;  [facility] Service: Plastics;  Laterality: Bilateral;    There were no vitals filed for this visit.  Visit Diagnosis:  Status post bilateral breast reconstruction  Decreased right shoulder range of motion  Swelling in chest      Subjective Assessment - 12/24/14 1031    Subjective Been doing the HEP from last time and I've been gardening more. The swelling feels the same, hasnt gotten worse with my increased activity.    Currently in Pain? No/denies                         [facility] Adult PT Treatment/Exercise - 12/24/14 0001    Manual Therapy   Myofascial Release To axilla and UE pulling both throughout PROM.   Manual Lymphatic Drainage (MLD) In Supine: Short neck, diphragmatic breathing, Rtinguinal and Lt axilla nodes, Rt axillo-inguinal and anterior inter-anastomosis and then  focused on Rt upper chest swelling instructing pt in this throuhgout.   Passive ROM In Supine to Rt shoulder into flexion and abduction all to pts tolerance.                PT Education - 12/24/14 1105    Education provided Yes   Education Details Self manual lymph drainage   Person(s) Educated Patient   Methods Explanation;Demonstration;Handout   Comprehension Verbalized understanding;Returned demonstration;Need further instruction           Short Term Clinic Goals - 12/20/14 1211    CC Short Term Goal  #1   Title Patient will be able to demonstrate initial home exercise program safely    Time 4   Period Weeks   Status New   CC Short Term Goal  #2   Title Patient will be able to increase right shoulder active flexion to >/= 145 degrees for increased ease with reaching   Time 4   Period Weeks   Status New   CC Short Term Goal  #3   Title Patient will be able to increase right shoulder active internal rotation to >/= 50 degrees for increased ease scratching her back   Time 4   Period Weeks   Status New   CC Short Term Goal  #4   Title Patient will be able to report >/= 25% improvement in right chest swelling for increased comfort and improved cosmesis   Time 4   Period Weeks   Status New             Long Term Clinic Goals - 12/20/14 1214    CC Long Term Goal  #1   Title Patient will be able to increase right shoulder active flexion to >/= 155 degrees for increased ease withr reaching   Time 8   Period Weeks   Status New   CC Long Term Goal  #2   Title Patient will be able to increase right shoulder active internal rotation to >/= 60 degrees for increased ease scratching her back   Time 8   Period Weeks   Status New   CC Long Term Goal  #3   Title Patient will be able to report >/= 50% less swelling present in her right chest for incerased comfort and improved cosmesis.   Time 8   Period Weeks   Status New            Plan - 12/24/14 1108     Clinical Impression Statement Pt tolerated stretching really well today and she reported feeling good after. Pt had a good understanding  of manual lymph drainage after instructing pt today.    Pt will benefit from skilled therapeutic intervention in order to improve on the following deficits Decreased strength;Impaired UE functional use;Increased edema;Decreased range of motion   Rehab Potential Excellent   Clinical Impairments Affecting Rehab Potential none   PT Frequency 3x / week   PT Duration 8 weeks   PT Treatment/Interventions Manual techniques;Passive range of motion;Therapeutic exercise;Patient/family education;Manual lymph drainage   PT Next Visit Plan Continue PROM right shoulder and add neural tension stretch; cont manual lymph drainage to address right upper chest swelling/review with pt.   PT Home Exercise Plan Self manual lymph drainage and myofascial release at axilla   Consulted and Agree with Plan of Care Patient        Problem List Patient Active Problem List   Diagnosis Date Noted  . CAD (coronary artery disease), native coronary artery 01/01/2014  . Chest pain 01/01/2014  . Hypertension   . Ventricular tachycardia, non-sustained   . OSA (obstructive sleep apnea)   . Breast cancer of upper-outer quadrant of right female breast 06/28/2013  . Postoperative visit 06/02/2013  . Gout   . Tachycardia     Otelia Limes, PTA 12/24/2014, 11:17 AM  [place], [place], [address] Phone: [phone]   Fax:  [phone]

## 2014-12-24 NOTE — Patient Instructions (Signed)

## 2014-12-26 ENCOUNTER — Ambulatory Visit: Payer: Medicare Other

## 2014-12-26 DIAGNOSIS — M25611 Stiffness of right shoulder, not elsewhere classified: Secondary | ICD-10-CM

## 2014-12-26 DIAGNOSIS — Z9889 Other specified postprocedural states: Secondary | ICD-10-CM | POA: Diagnosis not present

## 2014-12-26 DIAGNOSIS — M25511 Pain in right shoulder: Secondary | ICD-10-CM | POA: Diagnosis not present

## 2014-12-26 DIAGNOSIS — Z961 Presence of intraocular lens: Secondary | ICD-10-CM | POA: Diagnosis not present

## 2014-12-26 DIAGNOSIS — R222 Localized swelling, mass and lump, trunk: Secondary | ICD-10-CM | POA: Diagnosis not present

## 2014-12-26 NOTE — Therapy (Signed)
[place], [place], [address] Phone: [phone]   Fax:  [phone]  Physical Therapy Treatment  Patient Details  Name: Tracey [name] MRN: [id number] Date of Birth: 18-Jan-1946 Referring Provider:  Chauncey Cruel, MD  Encounter Date: 12/26/2014      PT End of Session - 12/26/14 1434    Visit Number 3   Number of Visits 16   Date for PT Re-Evaluation 02/14/15   PT Start Time 5400   PT Stop Time 1433   PT Time Calculation (min) 40 min      Past Medical History  Diagnosis Date  . Arthritis   . Gout     takes Allopurinol daily and Colchicine daily prn;last attack 23yr ago  . Tachycardia     takes Metoprolol daily  . Hyperlipidemia     takes Pravastatin daily  . Gout   . Breast cancer 2014    ER+/PR+/HEr2-,   . Colon polyps     2 polyps by report  . Rhinitis     uses Flonase prn  . Insomnia     takes Ambien nightly prn  . Anxiety     takes Ativan daily prn  . Peripheral edema     takes Furosemide daily prn  . PONV (postoperative nausea and vomiting)   . History of bronchitis     last time at least 872yrago  . Osteoarthritis   . Bruises easily   . GERD (gastroesophageal reflux disease)     occasionally takes Nexium   . Diverticulosis   . History of bladder infections     many yrs ago  . Allergy   . Radiation 07/27/13-09/07/13    Right Breast x 31 treatments  . Ventricular tachycardia, non-sustained     during sleep study 2009 with normal cardiac workup  . OSA (obstructive sleep apnea)     on CPAP  . Sinus congestion   . Postmenopausal hormone therapy   . History of stress incontinence   . Status post breast reconstruction 10/15/14    Bilateral implant removal and DIEP performed in [place]    Past Surgical History  Procedure Laterality Date  . Appendectomy    . Gallbladder surgery    . Cholecystectomy    . Cosmetic surgery    . Tonsillectomy    . Abdominal  hysterectomy    . Cardiac catheterization  1999  . Colonoscopy    . Bilateral cataract surgery    . Urinary tightening      d/t urinary incontinence  . Total mastectomy Bilateral 05/23/2013    WITH RECONSTRUCTION  . Total mastectomy Bilateral 05/23/2013    Procedure: TOTAL MASTECTOMY;  Surgeon: ToOdis HollingsheadMD;  [facility]: General;  Laterality: Bilateral;  . Axillary sentinel node biopsy Right 05/23/2013    Procedure: AXILLARY SENTINEL NODE BIOPSY;  Surgeon: ToOdis HollingsheadMD;  [facility] Service: General;  Laterality: Right;  nuc med injection 7:00  . Breast reconstruction with placement of tissue expander and flex hd (acellular hydrated dermis) Bilateral 05/23/2013    Procedure: BILATERAL BREAST RECONSTRUCTION WITH PLACEMENT OF TISSUE EXPANDER AND FLEX HD;  Surgeon: DaCrissie ReeseMD;  [facility] Service: Plastics;  Laterality: Bilateral;  . Breast reconstruction Bilateral 05/17/2014    W  . Removal of bilateral tissue expanders with placement of bilateral breast implants Bilateral 05/17/2014    Procedure: REMOVAL OF BILATERAL TISSUE EXPANDERS WITH PLACEMENT  OF BILATERAL BREAST IMPLANTS FOR RECONSTRUCTION;  Surgeon: David Bowers, MD;  [facility];  Service: Plastics;  Laterality: Bilateral;    There were no vitals filed for this visit.  Visit Diagnosis:  Status post bilateral breast reconstruction  Decreased right shoulder range of motion  Swelling in chest      Subjective Assessment - 12/26/14 1355    Subjective Felt great after last visit, did have some throbbing that woke me up that night in my Rt upper arm and that was new, but it was gone in the morning. Want yo uto review with me what you taught me last visit.    Currently in Pain? No/denies                         [facility] Adult PT Treatment/Exercise - 12/26/14 0001    Manual Therapy   Myofascial Release To axilla and UE pulling both throughout PROM.   Manual  Lymphatic Drainage (MLD) In Supine: Short neck, diphragmatic breathing 5 reps, Rtinguinal and Lt axilla nodes, Rt axillo-inguinal and anterior inter-anastomosis and then focused on Rt upper chest swelling reviewing with pt in this throuhgout.   Passive ROM In Supine to Rt shoulder into flexion and abduction and neural tension stretch all to pts tolerance.      Soft tissue work to Rt upper arm where pt was c/o intermittent sharp pain.          PT Education - 12/26/14 1436    Education provided Yes   Education Details Reviewed manual lymph drainage   Person(s) Educated Patient   Methods Explanation;Demonstration;Tactile cues   Comprehension Verbalized understanding;Returned demonstration;Need further instruction           Short Term Clinic Goals - 12/20/14 1211    CC Short Term Goal  #1   Title Patient will be able to demonstrate initial home exercise program safely    Time 4   Period Weeks   Status New   CC Short Term Goal  #2   Title Patient will be able to increase right shoulder active flexion to >/= 145 degrees for increased ease with reaching   Time 4   Period Weeks   Status New   CC Short Term Goal  #3   Title Patient will be able to increase right shoulder active internal rotation to >/= 50 degrees for increased ease scratching her back   Time 4   Period Weeks   Status New   CC Short Term Goal  #4   Title Patient will be able to report >/= 25% improvement in right chest swelling for increased comfort and improved cosmesis   Time 4   Period Weeks   Status New             Long Term Clinic Goals - 12/20/14 1214    CC Long Term Goal  #1   Title Patient will be able to increase right shoulder active flexion to >/= 155 degrees for increased ease withr reaching   Time 8   Period Weeks   Status New   CC Long Term Goal  #2   Title Patient will be able to increase right shoulder active internal rotation to >/= 60 degrees for increased ease scratching her back    Time 8   Period Weeks   Status New   CC Long Term Goal  #3   Title Patient will be able to report >/= 50% less swelling present in   her right chest for incerased comfort and improved cosmesis.   Time 8   Period Weeks   Status New            Plan - 12/26/14 1435    Clinical Impression Statement Noticeable softening of fluid at anterior Rt chest wall today and pts PROM seemed improved as well as her tolerance to it, though she was experiencing intermittent sharp pains at upper arm that eased off throughout treatment.    Pt will benefit from skilled therapeutic intervention in order to improve on the following deficits Decreased strength;Impaired UE functional use;Increased edema;Decreased range of motion   Rehab Potential Excellent   Clinical Impairments Affecting Rehab Potential none   PT Frequency 3x / week   PT Duration 8 weeks   PT Treatment/Interventions Manual techniques;Passive range of motion;Therapeutic exercise;Patient/family education;Manual lymph drainage   PT Next Visit Plan Continue PROM right shoulder and add neural tension stretch to HEP; cont manual lymph drainage to address right upper chest swelling/review with pt.   Consulted and Agree with Plan of Care Patient        Problem List Patient Active Problem List   Diagnosis Date Noted  . CAD (coronary artery disease), native coronary artery 01/01/2014  . Chest pain 01/01/2014  . Hypertension   . Ventricular tachycardia, non-sustained   . OSA (obstructive sleep apnea)   . Breast cancer of upper-outer quadrant of right female breast 06/28/2013  . Postoperative visit 06/02/2013  . Gout   . Tachycardia     Otelia Limes, PTA 12/26/2014, 2:38 PM  [place], [place], [address] Phone: [phone]   Fax:  [phone]

## 2014-12-27 ENCOUNTER — Encounter: Payer: Self-pay | Admitting: Physical Therapy

## 2014-12-28 ENCOUNTER — Ambulatory Visit: Payer: Medicare Other | Admitting: Physical Therapy

## 2014-12-28 DIAGNOSIS — R222 Localized swelling, mass and lump, trunk: Secondary | ICD-10-CM

## 2014-12-28 DIAGNOSIS — M25511 Pain in right shoulder: Secondary | ICD-10-CM | POA: Diagnosis not present

## 2014-12-28 DIAGNOSIS — M25611 Stiffness of right shoulder, not elsewhere classified: Secondary | ICD-10-CM

## 2014-12-28 DIAGNOSIS — Z9889 Other specified postprocedural states: Secondary | ICD-10-CM | POA: Diagnosis not present

## 2014-12-28 NOTE — Therapy (Signed)
[place], [place], [address] Phone: [phone]   Fax:  [phone]  Physical Therapy Treatment  Patient Details  Name: Tracey [name] MRN: [id number] Date of Birth: 05/03/1946 Referring Provider:  [name], MD  Encounter Date: 12/28/2014      PT End of Session - 12/28/14 1050    Visit Number 4   Number of Visits 16   Date for PT Re-Evaluation 02/14/15   PT Start Time 0854   PT Stop Time 0939   PT Time Calculation (min) 45 min   Activity Tolerance Patient tolerated treatment well   Behavior During Therapy [facility] for tasks assessed/performed      Past Medical History  Diagnosis Date  . Arthritis   . Gout     takes Allopurinol daily and Colchicine daily prn;last attack 75yr ago  . Tachycardia     takes Metoprolol daily  . Hyperlipidemia     takes Pravastatin daily  . Gout   . Breast cancer 2014    ER+/PR+/HEr2-,   . Colon polyps     2 polyps by report  . Rhinitis     uses Flonase prn  . Insomnia     takes Ambien nightly prn  . Anxiety     takes Ativan daily prn  . Peripheral edema     takes Furosemide daily prn  . PONV (postoperative nausea and vomiting)   . History of bronchitis     last time at least 857yrago  . Osteoarthritis   . Bruises easily   . GERD (gastroesophageal reflux disease)     occasionally takes Nexium   . Diverticulosis   . History of bladder infections     many yrs ago  . Allergy   . Radiation 07/27/13-09/07/13    Right Breast x 31 treatments  . Ventricular tachycardia, non-sustained     during sleep study 2009 with normal cardiac workup  . OSA (obstructive sleep apnea)     on CPAP  . Sinus congestion   . Postmenopausal hormone therapy   . History of stress incontinence   . Status post breast reconstruction 10/15/14    Bilateral implant removal and DIEP performed in [place]    Past Surgical History  Procedure Laterality Date  . Appendectomy     . Gallbladder surgery    . Cholecystectomy    . Cosmetic surgery    . Tonsillectomy    . Abdominal hysterectomy    . Cardiac catheterization  1999  . Colonoscopy    . Bilateral cataract surgery    . Urinary tightening      d/t urinary incontinence  . Total mastectomy Bilateral 05/23/2013    WITH RECONSTRUCTION  . Total mastectomy Bilateral 05/23/2013    Procedure: TOTAL MASTECTOMY;  Surgeon: ToOdis HollingsheadMD;  [facility] [facility]: General;  Laterality: Bilateral;  . Axillary sentinel node biopsy Right 05/23/2013    Procedure: AXILLARY SENTINEL NODE BIOPSY;  Surgeon: ToOdis HollingsheadMD;  [facility]: General;  Laterality: Right;  nuc med injection 7:00  . Breast reconstruction with placement of tissue expander and flex hd (acellular hydrated dermis) Bilateral 05/23/2013    Procedure: BILATERAL BREAST RECONSTRUCTION WITH PLACEMENT OF TISSUE EXPANDER AND FLEX HD;  Surgeon: DaCrissie ReeseMD;  [place] [facility] Service: Plastics;  Laterality: Bilateral;  . Breast reconstruction Bilateral 05/17/2014    W  . Removal of bilateral tissue expanders with placement of  bilateral breast implants Bilateral 05/17/2014    Procedure: REMOVAL OF BILATERAL TISSUE EXPANDERS WITH PLACEMENT OF BILATERAL BREAST IMPLANTS FOR RECONSTRUCTION;  Surgeon: [name], MD;  Location: [place];  Service: Plastics;  Laterality: Bilateral;    There were no vitals filed for this visit.  Visit Diagnosis:  Decreased right shoulder range of motion  Swelling in chest      Subjective Assessment - 12/28/14 0855    Subjective Swollen part at anterior right chest near shoulder has really come down.   Currently in Pain? Yes   Pain Score 6    Pain Location Arm   Pain Orientation Right;Upper   Aggravating Factors  raising arm in to abduction, taking top off   Pain Relieving Factors rubbing it            [facility] PT Assessment - 12/28/14 0001    Special Tests    Special Tests Rotator Cuff  Impingement   Rotator Cuff Impingment tests Painful Arc of Motion;Empty Can test;Neer impingement test   Neer Impingement test    Findings Negative   Empty Can test   Findings Positive   Side Right   Comment painful   Painful Arc of Motion   Findings Negative   Comments painful with elevating right arm in abduction with elbow bent                     [facility] Adult PT Treatment/Exercise - 12/28/14 0001    Exercises   Exercises Shoulder   Shoulder Exercises: Standing   External Rotation AAROM  door jamb stretch instructed   Manual Therapy   Manual Therapy Other (comment)   Manual Lymphatic Drainage (MLD) In Supine: Short neck, diphragmatic breathing 5 reps, Rtinguinal and Lt axilla nodes, Rt axillo-inguinal and anterior inter-anastomosis and then focused on Rt upper chest swelling reviewing with pt in this throuhgout.   Passive ROM In supine to tolerance for ER, abduction and flexion.     Other Manual Therapy cross friction massage to right bicep tendon in supine; soft tissue work with Biotone to right upper arm in supine                PT Education - 12/28/14 1048    Education provided Yes   Education Details Do right shoulder ER stretch with hand on door jamb, elbow bent, and turn body away to feel a stretch at shoulder.   Person(s) Educated Patient   Methods Explanation;Demonstration   Comprehension Returned demonstration           Short Term Clinic Goals - 12/20/14 1211    CC Short Term Goal  #1   Title Patient will be able to demonstrate initial home exercise program safely    Time 4   Period Weeks   Status New   CC Short Term Goal  #2   Title Patient will be able to increase right shoulder active flexion to >/= 145 degrees for increased ease with reaching   Time 4   Period Weeks   Status New   CC Short Term Goal  #3   Title Patient will be able to increase right shoulder active internal rotation to >/= 50 degrees for increased ease scratching  her back   Time 4   Period Weeks   Status New   CC Short Term Goal  #4   Title Patient will be able to report >/= 25% improvement in right chest swelling for increased comfort and improved cosmesis  Time 4   Period Weeks   Status New             Long Term Clinic Goals - 12/20/14 1214    CC Long Term Goal  #1   Title Patient will be able to increase right shoulder active flexion to >/= 155 degrees for increased ease withr reaching   Time 8   Period Weeks   Status New   CC Long Term Goal  #2   Title Patient will be able to increase right shoulder active internal rotation to >/= 60 degrees for increased ease scratching her back   Time 8   Period Weeks   Status New   CC Long Term Goal  #3   Title Patient will be able to report >/= 50% less swelling present in her right chest for incerased comfort and improved cosmesis.   Time 8   Period Weeks   Status New            Plan - 12/28/14 1050    Clinical Impression Statement Patient c/o right upper arm soreness today; felt better after treatment including soft tissue work to that area (deltoid) as well as cross friction massage, manual lymph drainage, and stretching.  Is benefiting from therapy, but still with complaints of discomfort and still with tightness that needs further stretching. Patient likes explanation of why specific treatments are done.                                               Pt will benefit from skilled therapeutic intervention in order to improve on the following deficits Decreased strength;Impaired UE functional use;Increased edema;Decreased range of motion   PT Treatment/Interventions Manual techniques;Passive range of motion;Therapeutic exercise;Patient/family education;Manual lymph drainage   PT Next Visit Plan Remeasure ROM.  Continue soft tissue work with Biotone to right upper arm; continue PROM and manual lymph drainage; add neural tension stretch to HEP.   PT Home Exercise Plan Add shoulder ER  stretch at door jamb.   Consulted and Agree with Plan of Care Patient        Problem List Patient Active Problem List   Diagnosis Date Noted  . CAD (coronary artery disease), native coronary artery 01/01/2014  . Chest pain 01/01/2014  . Hypertension   . Ventricular tachycardia, non-sustained   . OSA (obstructive sleep apnea)   . Breast cancer of upper-outer quadrant of right female breast 06/28/2013  . Postoperative visit 06/02/2013  . Gout   . Tachycardia     Margrett Kalb 12/28/2014, 10:56 AM  [place], [place], [address] Phone: [phone]   Fax:  [place], PT 12/28/2014 10:56 AM

## 2014-12-31 ENCOUNTER — Ambulatory Visit: Payer: Medicare Other | Admitting: Physical Therapy

## 2014-12-31 ENCOUNTER — Other Ambulatory Visit: Payer: Self-pay | Admitting: *Deleted

## 2014-12-31 DIAGNOSIS — M25611 Stiffness of right shoulder, not elsewhere classified: Secondary | ICD-10-CM

## 2014-12-31 DIAGNOSIS — M25511 Pain in right shoulder: Secondary | ICD-10-CM | POA: Diagnosis not present

## 2014-12-31 DIAGNOSIS — Z9889 Other specified postprocedural states: Secondary | ICD-10-CM | POA: Diagnosis not present

## 2014-12-31 DIAGNOSIS — R222 Localized swelling, mass and lump, trunk: Secondary | ICD-10-CM | POA: Diagnosis not present

## 2014-12-31 DIAGNOSIS — C50411 Malignant neoplasm of upper-outer quadrant of right female breast: Secondary | ICD-10-CM

## 2014-12-31 NOTE — Therapy (Signed)
[place], [place], [address] Phone: [phone]   Fax:  [phone]  Physical Therapy Treatment  Patient Details  Name: Tracey [name] MRN: [id number] Date of Birth: 11/13/1945 Referring Provider:  Kandyce Rud, MD  Encounter Date: 12/31/2014      PT End of Session - 12/31/14 1125    Visit Number 5   Number of Visits 16   Date for PT Re-Evaluation 02/14/15   PT Start Time 0940   PT Stop Time 1020   PT Time Calculation (min) 40 min      Past Medical History  Diagnosis Date  . Arthritis   . Gout     takes Allopurinol daily and Colchicine daily prn;last attack 59yr ago  . Tachycardia     takes Metoprolol daily  . Hyperlipidemia     takes Pravastatin daily  . Gout   . Breast cancer 2014    ER+/PR+/HEr2-,   . Colon polyps     2 polyps by report  . Rhinitis     uses Flonase prn  . Insomnia     takes Ambien nightly prn  . Anxiety     takes Ativan daily prn  . Peripheral edema     takes Furosemide daily prn  . PONV (postoperative nausea and vomiting)   . History of bronchitis     last time at least 835yrago  . Osteoarthritis   . Bruises easily   . GERD (gastroesophageal reflux disease)     occasionally takes Nexium   . Diverticulosis   . History of bladder infections     many yrs ago  . Allergy   . Radiation 07/27/13-09/07/13    Right Breast x 31 treatments  . Ventricular tachycardia, non-sustained     during sleep study 2009 with normal cardiac workup  . OSA (obstructive sleep apnea)     on CPAP  . Sinus congestion   . Postmenopausal hormone therapy   . History of stress incontinence   . Status post breast reconstruction 10/15/14    Bilateral implant removal and DIEP performed in [place]    Past Surgical History  Procedure Laterality Date  . Appendectomy    . Gallbladder surgery    . Cholecystectomy    . Cosmetic surgery    . Tonsillectomy    . Abdominal  hysterectomy    . Cardiac catheterization  1999  . Colonoscopy    . Bilateral cataract surgery    . Urinary tightening      d/t urinary incontinence  . Total mastectomy Bilateral 05/23/2013    WITH RECONSTRUCTION  . Total mastectomy Bilateral 05/23/2013    Procedure: TOTAL MASTECTOMY;  Surgeon: ToOdis HollingsheadMD;  [facility]: [facility];  Laterality: Bilateral;  . Axillary sentinel node biopsy Right 05/23/2013    Procedure: AXILLARY SENTINEL NODE BIOPSY;  Surgeon: ToOdis HollingsheadMD;  [facility]: General;  Laterality: Right;  nuc med injection 7:00  . Breast reconstruction with placement of tissue expander and flex hd (acellular hydrated dermis) Bilateral 05/23/2013    Procedure: BILATERAL BREAST RECONSTRUCTION WITH PLACEMENT OF TISSUE EXPANDER AND FLEX HD;  Surgeon: DaCrissie ReeseMD;  [facility] Service: Plastics;  Laterality: Bilateral;  . Breast reconstruction Bilateral 05/17/2014    W  . Removal of bilateral tissue expanders with placement of bilateral breast implants Bilateral 05/17/2014    Procedure: REMOVAL OF BILATERAL TISSUE EXPANDERS WITH PLACEMENT OF  BILATERAL BREAST IMPLANTS FOR RECONSTRUCTION;  Surgeon: [name], MD;  Location: [facility];  Service: Plastics;  Laterality: Bilateral;    There were no vitals filed for this visit.  Visit Diagnosis:  Decreased right shoulder range of motion - Plan: PT plan of care cert/re-cert  Swelling in chest - Plan: PT plan of care cert/re-cert  Status post bilateral breast reconstruction - Plan: PT plan of care cert/re-cert      Subjective Assessment - 12/31/14 0943    Subjective pt wants to get back to golfing and just be done with everything   Currently in Pain? Yes  pain depends on the position of shoulder   Pain Score 4    Pain Location Arm   Pain Orientation Right;Upper   Pain Descriptors / Indicators Aching                         [facility] Adult PT Treatment/Exercise -  12/31/14 0001    Manual Therapy   Myofascial Release right axilla around scar   Scapular Mobilization in left sidelying to right scapula especially in inferior glide    Manual Lymphatic Drainage (MLD) In Supine: Short neck, diphragmatic breathing 5 reps, Rtinguinal and Lt axilla nodes, Rt axillo-inguinal and anterior inter-anastomosis right chest shoulder to hand and return with extra time spent around axiallary scar   Passive ROM PROM/ AAROM, AROM to rigfht shouler felxion in supine and sidelying, with stretching to lateral  trunk and axilla   Other Manual Therapy cross friction massage to right bicep tendon in supine; soft tissue work with Biotone to right upper arm in supine                   Short Term Clinic Goals - 12/31/14 1225    CC Short Term Goal  #1   Title Patient will be able to demonstrate initial home exercise program safely    Status On-going   CC Short Term Goal  #2   Title Patient will be able to increase right shoulder active flexion to >/= 145 degrees for increased ease with reaching   Status On-going   CC Short Term Goal  #3   Title Patient will be able to increase right shoulder active internal rotation to >/= 50 degrees for increased ease scratching her back   Status On-going   CC Short Term Goal  #4   Title Patient will be able to report >/= 25% improvement in right chest swelling for increased comfort and improved cosmesis   Status On-going             [facility] Goals - 12/31/14 1225    CC Long Term Goal  #1   Title Patient will be able to increase right shoulder active flexion to >/= 155 degrees for increased ease withr reaching   Status On-going   CC Long Term Goal  #2   Title Patient will be able to increase right shoulder active internal rotation to >/= 60 degrees for increased ease scratching her back   Status On-going   CC Long Term Goal  #3   Title Patient will be able to report >/= 50% less swelling present in her right chest  for incerased comfort and improved cosmesis.   Status On-going            Plan - 12/31/14 1125    Clinical Impression Statement pt continues to have ache in right upper arm near area of deltiod  insertion.  She asked about  a "patch" to help with pain control.  She responded well to stretching and manual lymph draiange today.   PT Next Visit Plan Remeasure ROM.  Continue soft tissue work with Biotone to right upper arm; continue PROM and manual lymph drainage; add neural tension stretch to HEP. check for into order. Check to see if ionto order is back, if so, apply it.        Problem List Patient Active Problem List   Diagnosis Date Noted  . CAD (coronary artery disease), native coronary artery 01/01/2014  . Chest pain 01/01/2014  . Hypertension   . Ventricular tachycardia, non-sustained   . OSA (obstructive sleep apnea)   . Breast cancer of upper-outer quadrant of right female breast 06/28/2013  . Postoperative visit 06/02/2013  . Gout   . Tachycardia    Donato Heinz. Owens Shark, PT  12/31/2014, 12:26 PM  [place], [place], [address] Phone: [phone]   Fax:  [phone]

## 2014-12-31 NOTE — Patient Instructions (Signed)
  Cane Overhead - Supine  Hold cane at thighs with both hands, extend arms straight over head. Hold ___ seconds. Repeat ___ times. Do ___ times per day.  External Rotation (Eccentric), Active-Assist - Supine (Cane)  Lie on back, affected arm out from side, elbow at 90, forearm forward. Use cane to assist in lifting forearm of affected arm to neutral. Slowly lower for 3-5 seconds. ___ reps per set, ___ sets per day, ___ days per week. Add ___ lbs when you achieve ___ repetitions.  Copyright  VHI. All rights reserved.

## 2015-01-01 ENCOUNTER — Other Ambulatory Visit (HOSPITAL_BASED_OUTPATIENT_CLINIC_OR_DEPARTMENT_OTHER): Payer: Medicare Other

## 2015-01-01 ENCOUNTER — Ambulatory Visit: Payer: Medicare Other

## 2015-01-01 DIAGNOSIS — Z9889 Other specified postprocedural states: Secondary | ICD-10-CM

## 2015-01-01 DIAGNOSIS — C50411 Malignant neoplasm of upper-outer quadrant of right female breast: Secondary | ICD-10-CM | POA: Diagnosis present

## 2015-01-01 DIAGNOSIS — M25611 Stiffness of right shoulder, not elsewhere classified: Secondary | ICD-10-CM

## 2015-01-01 DIAGNOSIS — E559 Vitamin D deficiency, unspecified: Secondary | ICD-10-CM | POA: Diagnosis not present

## 2015-01-01 DIAGNOSIS — R222 Localized swelling, mass and lump, trunk: Secondary | ICD-10-CM | POA: Diagnosis not present

## 2015-01-01 DIAGNOSIS — M25511 Pain in right shoulder: Secondary | ICD-10-CM | POA: Diagnosis not present

## 2015-01-01 LAB — COMPREHENSIVE METABOLIC PANEL (CC13)
ALT: 22 U/L (ref 0–55)
AST: 21 U/L (ref 5–34)
Albumin: 3.9 g/dL (ref 3.5–5.0)
Alkaline Phosphatase: 94 U/L (ref 40–150)
Anion Gap: 11 mEq/L (ref 3–11)
BUN: 20.6 mg/dL (ref 7.0–26.0)
CALCIUM: 9.8 mg/dL (ref 8.4–10.4)
CHLORIDE: 105 meq/L (ref 98–109)
CO2: 26 meq/L (ref 22–29)
CREATININE: 1 mg/dL (ref 0.6–1.1)
EGFR: 56 mL/min/{1.73_m2} — ABNORMAL LOW (ref >90)
Glucose: 116 mg/dl (ref 70–140)
Potassium: 4.5 mEq/L (ref 3.5–5.1)
Sodium: 142 mEq/L (ref 136–145)
Total Bilirubin: 0.4 mg/dL (ref 0.20–1.20)
Total Protein: 6.8 g/dL (ref 6.4–8.3)

## 2015-01-01 LAB — CBC WITH DIFFERENTIAL/PLATELET
BASO%: 0.4 % (ref 0.0–2.0)
Basophils Absolute: 0 10*3/uL (ref 0.0–0.1)
EOS%: 4.6 % (ref 0.0–7.0)
Eosinophils Absolute: 0.2 10*3/uL (ref 0.0–0.5)
HCT: 40.5 % (ref 34.8–46.6)
HGB: 13.4 g/dL (ref 11.6–15.9)
LYMPH%: 33.3 % (ref 14.0–49.7)
MCH: 29.5 pg (ref 25.1–34.0)
MCHC: 33.1 g/dL (ref 31.5–36.0)
MCV: 89 fL (ref 79.5–101.0)
MONO#: 0.3 10*3/uL (ref 0.1–0.9)
MONO%: 6.1 % (ref 0.0–14.0)
NEUT%: 55.6 % (ref 38.4–76.8)
NEUTROS ABS: 2.8 10*3/uL (ref 1.5–6.5)
Platelets: 206 10*3/uL (ref 145–400)
RBC: 4.55 10*6/uL (ref 3.70–5.45)
RDW: 14.3 % (ref 11.2–14.5)
WBC: 5.1 10*3/uL (ref 3.9–10.3)
lymph#: 1.7 10*3/uL (ref 0.9–3.3)

## 2015-01-01 NOTE — Therapy (Signed)
[place], [place], [address] Phone: [phone]   Fax:  [phone]  Physical Therapy Treatment  Patient Details  Name: Tracey [name] MRN: [id number] Date of Birth: [birth year] Referring Provider:  Kandyce Rud, MD  Encounter Date: 01/01/2015      PT End of Session - 01/01/15 1125    Visit Number 6   Number of Visits 16   Date for PT Re-Evaluation 02/14/15   PT Start Time 1027   PT Stop Time 1121   PT Time Calculation (min) 54 min      Past Medical History  Diagnosis Date  . Arthritis   . Gout     takes Allopurinol daily and Colchicine daily prn;last attack 70yr ago  . Tachycardia     takes Metoprolol daily  . Hyperlipidemia     takes Pravastatin daily  . Gout   . Breast cancer 2014    ER+/PR+/HEr2-,   . Colon polyps     2 polyps by report  . Rhinitis     uses Flonase prn  . Insomnia     takes Ambien nightly prn  . Anxiety     takes Ativan daily prn  . Peripheral edema     takes Furosemide daily prn  . PONV (postoperative nausea and vomiting)   . History of bronchitis     last time at least 862yrago  . Osteoarthritis   . Bruises easily   . GERD (gastroesophageal reflux disease)     occasionally takes Nexium   . Diverticulosis   . History of bladder infections     many yrs ago  . Allergy   . Radiation 07/27/13-09/07/13    Right Breast x 31 treatments  . Ventricular tachycardia, non-sustained     during sleep study 2009 with normal cardiac workup  . OSA (obstructive sleep apnea)     on CPAP  . Sinus congestion   . Postmenopausal hormone therapy   . History of stress incontinence   . Status post breast reconstruction 10/15/14    Bilateral implant removal and DIEP performed in [place]    Past Surgical History  Procedure Laterality Date  . Appendectomy    . Gallbladder surgery    . Cholecystectomy    . Cosmetic surgery    . Tonsillectomy    . Abdominal  hysterectomy    . Cardiac catheterization  1999  . Colonoscopy    . Bilateral cataract surgery    . Urinary tightening      d/t urinary incontinence  . Total mastectomy Bilateral 05/23/2013    WITH RECONSTRUCTION  . Total mastectomy Bilateral 05/23/2013    Procedure: TOTAL MASTECTOMY;  Surgeon: ToOdis HollingsheadMD;  [facility] Service: General;  Laterality: Bilateral;  . Axillary sentinel node biopsy Right 05/23/2013    Procedure: AXILLARY SENTINEL NODE BIOPSY;  Surgeon: ToOdis HollingsheadMD;  [facility]: [facility];  Laterality: Right;  nuc med injection 7:00  . Breast reconstruction with placement of tissue expander and flex hd (acellular hydrated dermis) Bilateral 05/23/2013    Procedure: BILATERAL BREAST RECONSTRUCTION WITH PLACEMENT OF TISSUE EXPANDER AND FLEX HD;  Surgeon: DaCrissie ReeseMD;  [facility] Service: Plastics;  Laterality: Bilateral;  . Breast reconstruction Bilateral 05/17/2014    W  . Removal of bilateral tissue expanders with placement of bilateral breast implants Bilateral 05/17/2014    Procedure: REMOVAL OF BILATERAL TISSUE EXPANDERS WITH PLACEMENT OF  BILATERAL BREAST IMPLANTS FOR RECONSTRUCTION;  Surgeon: [name], MD;  Location: [facility];  Service: Plastics;  Laterality: Bilateral;    There were no vitals filed for this visit.  Visit Diagnosis:  Decreased right shoulder range of motion  Swelling in chest  Status post bilateral breast reconstruction      Subjective Assessment - 01/01/15 1033    Subjective The work you guys have been doing on my upper arm has really been helping, and the "lump" near my axilla is finally softening too.    Currently in Pain? No/denies                         [facility] Adult PT Treatment/Exercise - 01/01/15 0001    Manual Therapy   Myofascial Release right axilla around scar   Scapular Mobilization in left sidelying to right scapula especially in inferior glide    Manual Lymphatic  Drainage (MLD) In Supine: Short neck, diphragmatic breathing 5 reps, Rtinguinal and Lt axilla nodes, Rt axillo-inguinal and anterior inter-anastomosis right chest shoulder to hand and return with extra time spent around axiallary scar   Passive ROM PROM to right shouler flexion in supine and sidelying, with stretching to lateral  trunk and axilla   Other Manual Therapy cross friction massage to right bicep tendon in supine; soft tissue work with Biotone to right upper arm in supine                   Short Term Clinic Goals - 12/31/14 1225    CC Short Term Goal  #1   Title Patient will be able to demonstrate initial home exercise program safely    Status On-going   CC Short Term Goal  #2   Title Patient will be able to increase right shoulder active flexion to >/= 145 degrees for increased ease with reaching   Status On-going   CC Short Term Goal  #3   Title Patient will be able to increase right shoulder active internal rotation to >/= 50 degrees for increased ease scratching her back   Status On-going   CC Short Term Goal  #4   Title Patient will be able to report >/= 25% improvement in right chest swelling for increased comfort and improved cosmesis   Status On-going             [facility] Goals - 12/31/14 1225    CC Long Term Goal  #1   Title Patient will be able to increase right shoulder active flexion to >/= 155 degrees for increased ease withr reaching   Status On-going   CC Long Term Goal  #2   Title Patient will be able to increase right shoulder active internal rotation to >/= 60 degrees for increased ease scratching her back   Status On-going   CC Long Term Goal  #3   Title Patient will be able to report >/= 50% less swelling present in her right chest for incerased comfort and improved cosmesis.   Status On-going            Plan - 01/01/15 1126    Clinical Impression Statement Pt has had definite decrease of fluid at Rt chest near axilla and  PROM has increased. Pt also continues to report benefit of treatment noticing some increase AROM with ADLs and less pain/tightness.    Pt will benefit from skilled therapeutic intervention in order to improve on the following deficits Decreased strength;Impaired UE  functional use;Increased edema;Decreased range of motion   Rehab Potential Excellent   Clinical Impairments Affecting Rehab Potential none   PT Frequency 3x / week   PT Duration 8 weeks   PT Treatment/Interventions Manual techniques;Passive range of motion;Therapeutic exercise;Patient/family education;Manual lymph drainage   PT Next Visit Plan Remeasure ROM.  Continue soft tissue work with Biotone to right upper arm; continue PROM and manual lymph drainage; add neural tension stretch to HEP. Check to see if ionto order is back, if so, apply it.   Consulted and Agree with Plan of Care Patient        Problem List Patient Active Problem List   Diagnosis Date Noted  . CAD (coronary artery disease), native coronary artery 01/01/2014  . Chest pain 01/01/2014  . Hypertension   . Ventricular tachycardia, non-sustained   . OSA (obstructive sleep apnea)   . Breast cancer of upper-outer quadrant of right female breast 06/28/2013  . Postoperative visit 06/02/2013  . Gout   . Tachycardia     Otelia Limes, PTA 01/01/2015, 11:33 AM  [place], [place], [address] Phone: [phone]   Fax:  [phone]

## 2015-01-02 ENCOUNTER — Ambulatory Visit: Payer: Medicare Other

## 2015-01-02 DIAGNOSIS — M25511 Pain in right shoulder: Secondary | ICD-10-CM | POA: Diagnosis not present

## 2015-01-02 DIAGNOSIS — Z9889 Other specified postprocedural states: Secondary | ICD-10-CM | POA: Diagnosis not present

## 2015-01-02 DIAGNOSIS — R222 Localized swelling, mass and lump, trunk: Secondary | ICD-10-CM

## 2015-01-02 DIAGNOSIS — M25611 Stiffness of right shoulder, not elsewhere classified: Secondary | ICD-10-CM

## 2015-01-02 LAB — VITAMIN D 25 HYDROXY (VIT D DEFICIENCY, FRACTURES): VIT D 25 HYDROXY: 97 ng/mL (ref 30–100)

## 2015-01-02 NOTE — Therapy (Signed)
[place], [place], [address] Phone: [phone]   Fax:  [phone]  Physical Therapy Treatment  Patient Details  Name: Tracey [name] MRN: [id number] Date of Birth: 08-01-1946 Referring Provider:  Kandyce Rud, MD  Encounter Date: 01/02/2015      PT End of Session - 01/02/15 1528    Visit Number 7   Number of Visits 16   Date for PT Re-Evaluation 02/14/15   PT Start Time 2355   PT Stop Time 1525   PT Time Calculation (min) 42 min      Past Medical History  Diagnosis Date  . Arthritis   . Gout     takes Allopurinol daily and Colchicine daily prn;last attack 51yr ago  . Tachycardia     takes Metoprolol daily  . Hyperlipidemia     takes Pravastatin daily  . Gout   . Breast cancer 2014    ER+/PR+/HEr2-,   . Colon polyps     2 polyps by report  . Rhinitis     uses Flonase prn  . Insomnia     takes Ambien nightly prn  . Anxiety     takes Ativan daily prn  . Peripheral edema     takes Furosemide daily prn  . PONV (postoperative nausea and vomiting)   . History of bronchitis     last time at least 85yrago  . Osteoarthritis   . Bruises easily   . GERD (gastroesophageal reflux disease)     occasionally takes Nexium   . Diverticulosis   . History of bladder infections     many yrs ago  . Allergy   . Radiation 07/27/13-09/07/13    Right Breast x 31 treatments  . Ventricular tachycardia, non-sustained     during sleep study 2009 with normal cardiac workup  . OSA (obstructive sleep apnea)     on CPAP  . Sinus congestion   . Postmenopausal hormone therapy   . History of stress incontinence   . Status post breast reconstruction 10/15/14    Bilateral implant removal and DIEP performed in [place]    Past Surgical History  Procedure Laterality Date  . Appendectomy    . Gallbladder surgery    . Cholecystectomy    . Cosmetic surgery    . Tonsillectomy    . Abdominal  hysterectomy    . Cardiac catheterization  1999  . Colonoscopy    . Bilateral cataract surgery    . Urinary tightening      d/t urinary incontinence  . Total mastectomy Bilateral 05/23/2013    WITH RECONSTRUCTION  . Total mastectomy Bilateral 05/23/2013    Procedure: TOTAL MASTECTOMY;  Surgeon: ToOdis HollingsheadMD;  [facility] Service: General;  Laterality: Bilateral;  . Axillary sentinel node biopsy Right 05/23/2013    Procedure: AXILLARY SENTINEL NODE BIOPSY;  Surgeon: ToOdis HollingsheadMD;  [facility] Service: General;  Laterality: Right;  nuc med injection 7:00  . Breast reconstruction with placement of tissue expander and flex hd (acellular hydrated dermis) Bilateral 05/23/2013    Procedure: BILATERAL BREAST RECONSTRUCTION WITH PLACEMENT OF TISSUE EXPANDER AND FLEX HD;  Surgeon: DaCrissie ReeseMD;  [facility] Service: Plastics;  Laterality: Bilateral;  . Breast reconstruction Bilateral 05/17/2014    W  . Removal of bilateral tissue expanders with placement of bilateral breast implants Bilateral 05/17/2014    Procedure: REMOVAL OF BILATERAL TISSUE EXPANDERS WITH PLACEMENT OF  BILATERAL BREAST IMPLANTS FOR RECONSTRUCTION;  Surgeon: [name], MD;  Location: [facility];  Service: Plastics;  Laterality: Bilateral;    There were no vitals filed for this visit.  Visit Diagnosis:  Decreased right shoulder range of motion  Swelling in chest  Status post bilateral breast reconstruction      Subjective Assessment - 01/02/15 1513    Subjective My arm is feeling a little better every day.    Currently in Pain? No/denies                         [facility] Adult PT Treatment/Exercise - 01/02/15 0001    Manual Therapy   Myofascial Release right axilla around scar   Manual Lymphatic Drainage (MLD) In Supine: Short neck, diphragmatic breathing 5 reps, Rtinguinal and Lt axilla nodes, Rt axillo-inguinal and anterior inter-anastomosis right chest shoulder to hand  and return with extra time spent around axiallary scar   Passive ROM PROM to right shouler flexion in supine with stretching to lateral  trunk and axilla   Other Manual Therapy cross friction massage to right bicep tendon in supine; soft tissue work with Biotone to right upper arm in supine                   Short Term Clinic Goals - 12/31/14 1225    CC Short Term Goal  #1   Title Patient will be able to demonstrate initial home exercise program safely    Status On-going   CC Short Term Goal  #2   Title Patient will be able to increase right shoulder active flexion to >/= 145 degrees for increased ease with reaching   Status On-going   CC Short Term Goal  #3   Title Patient will be able to increase right shoulder active internal rotation to >/= 50 degrees for increased ease scratching her back   Status On-going   CC Short Term Goal  #4   Title Patient will be able to report >/= 25% improvement in right chest swelling for increased comfort and improved cosmesis   Status On-going             [facility] Goals - 12/31/14 1225    CC Long Term Goal  #1   Title Patient will be able to increase right shoulder active flexion to >/= 155 degrees for increased ease withr reaching   Status On-going   CC Long Term Goal  #2   Title Patient will be able to increase right shoulder active internal rotation to >/= 60 degrees for increased ease scratching her back   Status On-going   CC Long Term Goal  #3   Title Patient will be able to report >/= 50% less swelling present in her right chest for incerased comfort and improved cosmesis.   Status On-going            Plan - 01/02/15 1530    Clinical Impression Statement Pt continues to notice benefit from treatment and increase ROM noticed with ADLs. Pt is making good progress with goals.    Pt will benefit from skilled therapeutic intervention in order to improve on the following deficits Decreased strength;Impaired UE  functional use;Increased edema;Decreased range of motion   Rehab Potential Excellent   Clinical Impairments Affecting Rehab Potential none   PT Frequency 3x / week   PT Duration 8 weeks   PT Treatment/Interventions Manual techniques;Passive range of motion;Therapeutic exercise;Patient/family education;Manual lymph drainage  PT Next Visit Plan Remeasure ROM.  Continue soft tissue work with Biotone to right upper arm; continue PROM and manual lymph drainage; add neural tension stretch to HEP. Check to see if ionto order is back, if so, apply it.   Consulted and Agree with Plan of Care Patient        Problem List Patient Active Problem List   Diagnosis Date Noted  . CAD (coronary artery disease), native coronary artery 01/01/2014  . Chest pain 01/01/2014  . Hypertension   . Ventricular tachycardia, non-sustained   . OSA (obstructive sleep apnea)   . Breast cancer of upper-outer quadrant of right female breast 06/28/2013  . Postoperative visit 06/02/2013  . Gout   . Tachycardia     Otelia Limes, PTA 01/02/2015, 3:35 PM  [place], [place], [address] Phone: [phone]   Fax:  [phone]

## 2015-01-07 ENCOUNTER — Ambulatory Visit: Payer: Medicare Other | Admitting: Physical Therapy

## 2015-01-07 DIAGNOSIS — Z9889 Other specified postprocedural states: Secondary | ICD-10-CM | POA: Diagnosis not present

## 2015-01-07 DIAGNOSIS — M25511 Pain in right shoulder: Secondary | ICD-10-CM

## 2015-01-07 DIAGNOSIS — M25611 Stiffness of right shoulder, not elsewhere classified: Secondary | ICD-10-CM

## 2015-01-07 DIAGNOSIS — R222 Localized swelling, mass and lump, trunk: Secondary | ICD-10-CM

## 2015-01-07 NOTE — Therapy (Signed)
[place], [place], [address] Phone: [phone]   Fax:  [phone]  Physical Therapy Treatment  Patient Details  Name: Tracey [name] MRN: [id number] Date of Birth: 1946-08-13 Referring Provider:  Kandyce Rud, MD  Encounter Date: 01/07/2015      PT End of Session - 01/07/15 1248    Visit Number 8   Number of Visits 16   Date for PT Re-Evaluation 02/14/15   PT Start Time 0938   PT Stop Time 1022   PT Time Calculation (min) 44 min   Activity Tolerance Patient tolerated treatment well   Behavior During Therapy [facility] for tasks assessed/performed      Past Medical History  Diagnosis Date  . Arthritis   . Gout     takes Allopurinol daily and Colchicine daily prn;last attack 36yr ago  . Tachycardia     takes Metoprolol daily  . Hyperlipidemia     takes Pravastatin daily  . Gout   . Breast cancer 2014    ER+/PR+/HEr2-,   . Colon polyps     2 polyps by report  . Rhinitis     uses Flonase prn  . Insomnia     takes Ambien nightly prn  . Anxiety     takes Ativan daily prn  . Peripheral edema     takes Furosemide daily prn  . PONV (postoperative nausea and vomiting)   . History of bronchitis     last time at least 858yrago  . Osteoarthritis   . Bruises easily   . GERD (gastroesophageal reflux disease)     occasionally takes Nexium   . Diverticulosis   . History of bladder infections     many yrs ago  . Allergy   . Radiation 07/27/13-09/07/13    Right Breast x 31 treatments  . Ventricular tachycardia, non-sustained     during sleep study 2009 with normal cardiac workup  . OSA (obstructive sleep apnea)     on CPAP  . Sinus congestion   . Postmenopausal hormone therapy   . History of stress incontinence   . Status post breast reconstruction 10/15/14    Bilateral implant removal and DIEP performed in [place], [place]    Past Surgical History  Procedure Laterality Date  . Appendectomy     . Gallbladder surgery    . Cholecystectomy    . Cosmetic surgery    . Tonsillectomy    . Abdominal hysterectomy    . Cardiac catheterization  1999  . Colonoscopy    . Bilateral cataract surgery    . Urinary tightening      d/t urinary incontinence  . Total mastectomy Bilateral 05/23/2013    WITH RECONSTRUCTION  . Total mastectomy Bilateral 05/23/2013    Procedure: TOTAL MASTECTOMY;  Surgeon: ToOdis HollingsheadMD;  [facility] Service: General;  Laterality: Bilateral;  . Axillary sentinel node biopsy Right 05/23/2013    Procedure: AXILLARY SENTINEL NODE BIOPSY;  Surgeon: ToOdis HollingsheadMD;  [facility] Service: General;  Laterality: Right;  nuc med injection 7:00  . Breast reconstruction with placement of tissue expander and flex hd (acellular hydrated dermis) Bilateral 05/23/2013    Procedure: BILATERAL BREAST RECONSTRUCTION WITH PLACEMENT OF TISSUE EXPANDER AND FLEX HD;  Surgeon: DaCrissie ReeseMD;  [facility] Service: Plastics;  Laterality: Bilateral;  . Breast reconstruction Bilateral 05/17/2014    W  . Removal of bilateral tissue expanders with placement of  bilateral breast implants Bilateral 05/17/2014    Procedure: REMOVAL OF BILATERAL TISSUE EXPANDERS WITH PLACEMENT OF BILATERAL BREAST IMPLANTS FOR RECONSTRUCTION;  Surgeon: [name], MD;  Location: [facility];  Service: Plastics;  Laterality: Bilateral;    There were no vitals filed for this visit.  Visit Diagnosis:  Decreased right shoulder range of motion  Swelling in chest  Pain in joint, shoulder region, right      Subjective Assessment - 01/07/15 0937    Subjective "My battery went dead."  "I'm loving that this part is going down (at right lateral chest).'  Upper arm ache is not preventing me from doing anything, but it aches. Did yardwork all day on Saturday and didn't bother me until afterwards.   Currently in Pain? Yes   Pain Score 3    Pain Location Arm   Pain Orientation Right;Upper   Pain  Descriptors / Indicators Aching   Aggravating Factors  certain positions   Pain Relieving Factors rubbing it                         [facility] Adult PT Treatment/Exercise - 01/07/15 0001    Shoulder Exercises: ROM/Strengthening   Other ROM/Strengthening Exercises Rockwood 4 exercises vs. yellow Theraband x 10 each   Modalities   Modalities Iontophoresis   Iontophoresis   Type of Iontophoresis Dexamethasone   Location right shoulder from subacromial space toward biceps tendon insertion   Dose 1 ml   Time STAT patch x 6 hours   Manual Therapy   Manual Lymphatic Drainage (MLD) In supine, short neck, left axilla and anterior interaxillary anastomosis, right groin and axillo-inguinal anastomosis; in sidelying, posterior interaxillary anastomosis and right axillo-inguinal anastomosis.   Passive ROM right shoulder in supine for flexion, abduction, and ER                PT Education - 01/07/15 1248    Education provided Yes   Education Details Rockwood 4 with yellow Theraband for right shoulder; iontophoresis wear instructions and what to expect; also advised to avoid activity that aggravates right shoulder pain.   Person(s) Educated Patient   Methods Explanation;Demonstration;Handout   Comprehension Verbalized understanding;Returned demonstration           Short Term Clinic Goals - 12/31/14 1225    CC Short Term Goal  #1   Title Patient will be able to demonstrate initial home exercise program safely    Status On-going   CC Short Term Goal  #2   Title Patient will be able to increase right shoulder active flexion to >/= 145 degrees for increased ease with reaching   Status On-going   CC Short Term Goal  #3   Title Patient will be able to increase right shoulder active internal rotation to >/= 50 degrees for increased ease scratching her back   Status On-going   CC Short Term Goal  #4   Title Patient will be able to report >/= 25% improvement in right chest  swelling for increased comfort and improved cosmesis   Status On-going             Long Term Clinic Goals - 12/31/14 1225    CC Long Term Goal  #1   Title Patient will be able to increase right shoulder active flexion to >/= 155 degrees for increased ease withr reaching   Status On-going   CC Long Term Goal  #2   Title Patient will be able to increase  right shoulder active internal rotation to >/= 60 degrees for increased ease scratching her back   Status On-going   CC Long Term Goal  #3   Title Patient will be able to report >/= 50% less swelling present in her right chest for incerased comfort and improved cosmesis.   Status On-going            Plan - 01/07/15 1249    Clinical Impression Statement Patient pleased with reduction in right axillary swelling but reporting ongoing discomfort at right shoulder/upper arm, so iontophoresis was started today as order has been received.    Pt will benefit from skilled therapeutic intervention in order to improve on the following deficits Decreased strength;Impaired UE functional use;Increased edema;Decreased range of motion   PT Treatment/Interventions Manual techniques;Passive range of motion;Therapeutic exercise;Patient/family education;Manual lymph drainage   PT Next Visit Plan Remeasure ROM.  Continue manual, PROM, exercise instruction, iontophoresis.   Consulted and Agree with Plan of Care Patient        Problem List Patient Active Problem List   Diagnosis Date Noted  . CAD (coronary artery disease), native coronary artery 01/01/2014  . Chest pain 01/01/2014  . Hypertension   . Ventricular tachycardia, non-sustained   . OSA (obstructive sleep apnea)   . Breast cancer of upper-outer quadrant of right female breast 06/28/2013  . Postoperative visit 06/02/2013  . Gout   . Tachycardia     SALIS[place]Y,DONNA 01/07/2015, 12:52 PM  [place], [place], [address] Phone: [phone]   Fax:  [place], PT 01/07/2015 12:52 PM

## 2015-01-07 NOTE — Patient Instructions (Addendum)
Do these daily.  Do in a range of motion and position that does NOT CAUSE PAIN!! Start with yellow Theraband and progress to red when yellow is easy at 20 repetitions.  Strengthening: Resisted Internal Rotation   Hold tubing in left hand, elbow at side and forearm out. Rotate forearm in across body. Repeat __10-20__ times per set. Do __1__ sets per session. Do ___1_ sessions per day.  http://orth.exer.us/830   Copyright  VHI. All rights reserved.  Strengthening: Resisted External Rotation   Hold tubing in right hand, elbow at side and forearm across body. Rotate forearm out. Repeat __10-20__ times per set. Do __1__ sets per session. Do __1__ sessions per day.  http://orth.exer.us/828   Copyright  VHI. All rights reserved.  Strengthening: Resisted Flexion   Hold tubing with left arm at side. Pull forward and up. Move shoulder through pain-free range of motion. Repeat __10-20__ times per set. Do _1___ sets per session. Do ___1_ sessions per day.  http://orth.exer.us/824   Copyright  VHI. All rights reserved.  Strengthening: Resisted Extension   Hold tubing in right hand, arm forward. Pull arm back, elbow straight. Repeat _10-20___ times per set. Do __1__ sets per session. Do ___1_ sessions per day.  http://orth.exer.us/832   Copyright  VHI. All rights reserved.   IONTOPHORESIS PATIENT PRECAUTIONS & CONTRAINDICATIONS:  . Redness under one or both electrodes can occur.  This characterized by a uniform redness that usually disappears within 12 hours of treatment. . Small pinhead size blisters may result in response to the drug.  Contact your physician if the problem persists more than 24 hours. . On rare occasions, iontophoresis therapy can result in temporary skin reactions such as rash, inflammation, irritation or burns.  The skin reactions may be the result of individual sensitivity to the ionic solution used, the condition of the skin at the start of treatment, reaction  to the materials in the electrodes, allergies or sensitivity to dexamethasone, or a poor connection between the patch and your skin.  Discontinue using iontophoresis if you have any of these reactions and report to your therapist. . Remove the Patch or electrodes if you have any undue sensation of pain or burning during the treatment and report discomfort to your therapist. . Tell your Therapist if you have had known adverse reactions to the application of electrical current. . If using the Patch, the LED light will turn off when treatment is complete and the patch can be removed.  Approximate treatment time is 1-3 hours.  Remove the patch when light goes off or after 6 hours. . The Patch can be worn during normal activity, however excessive motion where the electrodes have been placed can cause poor contact between the skin and the electrode or uneven electrical current resulting in greater risk of skin irritation. Marland Kitchen Keep out of the reach of children.   . DO NOT use if you have a cardiac pacemaker or any other electrically sensitive implanted device. . DO NOT use if you have a known sensitivity to dexamethasone. . DO NOT use during Magnetic Resonance Imaging (MRI). . DO NOT use over broken or compromised skin (e.g. sunburn, cuts, or acne) due to the increased risk of skin reaction. . DO NOT SHAVE over the area to be treated:  To establish good contact between the Patch and the skin, excessive hair may be clipped. . DO NOT place the Patch or electrodes on or over your eyes, directly over your heart, or brain. . DO NOT reuse the  Patch or electrodes as this may cause burns to occur.

## 2015-01-09 ENCOUNTER — Telehealth: Payer: Self-pay | Admitting: Oncology

## 2015-01-09 ENCOUNTER — Ambulatory Visit: Payer: Medicare Other

## 2015-01-09 ENCOUNTER — Ambulatory Visit (HOSPITAL_BASED_OUTPATIENT_CLINIC_OR_DEPARTMENT_OTHER): Payer: Medicare Other | Admitting: Oncology

## 2015-01-09 ENCOUNTER — Other Ambulatory Visit: Payer: Self-pay | Admitting: Oncology

## 2015-01-09 ENCOUNTER — Other Ambulatory Visit: Payer: Self-pay | Admitting: *Deleted

## 2015-01-09 VITALS — BP 145/66 | HR 96 | Temp 97.5°F | Resp 18 | Ht 63.0 in | Wt 142.3 lb

## 2015-01-09 DIAGNOSIS — C50411 Malignant neoplasm of upper-outer quadrant of right female breast: Secondary | ICD-10-CM

## 2015-01-09 DIAGNOSIS — R222 Localized swelling, mass and lump, trunk: Secondary | ICD-10-CM | POA: Diagnosis not present

## 2015-01-09 DIAGNOSIS — M25611 Stiffness of right shoulder, not elsewhere classified: Secondary | ICD-10-CM

## 2015-01-09 DIAGNOSIS — Z79811 Long term (current) use of aromatase inhibitors: Secondary | ICD-10-CM

## 2015-01-09 DIAGNOSIS — M25511 Pain in right shoulder: Secondary | ICD-10-CM

## 2015-01-09 DIAGNOSIS — M810 Age-related osteoporosis without current pathological fracture: Secondary | ICD-10-CM

## 2015-01-09 DIAGNOSIS — E2839 Other primary ovarian failure: Secondary | ICD-10-CM

## 2015-01-09 DIAGNOSIS — Z9889 Other specified postprocedural states: Secondary | ICD-10-CM

## 2015-01-09 MED ORDER — CHOLECALCIFEROL 125 MCG (5000 UT) PO TABS: 1000.0000 [IU] | ORAL_TABLET | Freq: Every day | ORAL | Status: DC

## 2015-01-09 NOTE — Progress Notes (Signed)
ID: [name] OB: 03/22/1946  MR#: [id number]  PPJ#:[id number]  PCP: [name], MD/ [name] GYN:   SU: [name] OTHER MD: [name], William Hamburger [phone])  PCP: [name], MD GYN: SU: [name] OTHER MD: [name], Kandyce Rud    HISTORY OF PRESENT ILLNESS: From the original intent note:  An Earlyne had routine screening mammography July of 2014 showing a suspicious mass in her right breast. Additional views confirmed a mass in the upper outer quadrant of the right breast, which by ultrasound measured 4 mm. Biopsy of this mass was obtained in [place], at [facility]. 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  The patient's subsequent history is as detailed below  INTERVAL HISTORY: Tityana returns today for followup of her breast cancer accompanied by her daughter. Tawanna tells me she developed a "line" across her right breast sometime in February of this year while in [place]. She brought it to the attention of a surgeon there (actually what she wanted to do was to have the left side redone) and he felt this was an urgent problem. He set her up with Drs. Jimmye Norman and Arnold and she proceeded to have both her implants removed (she had never been satisfied with the left side) and proceeded to bilateral Diep flaps, which she tolerated well. She had nipple reconstruction in March. She is very satisfied with the cosmetic result at present. She had very little pain from the surgery, no significant problems with bleeding, or fever.  She continues on anastrozole. Hot flashes are not a major issue. She is managing vaginal dryness concerns. She has some "gel phenomenon" type aches and pains, but these are not more persistent or intense than prior. She is paying approximately $100 a month for the  anastrozole (the more common cost is approximately $12 a month).  REVIEW OF SYSTEMS: Rhonna feels a little bit forgetful. It is true she has had 3 surgeries in the last few months. She has some right upper extremity range of motion limitations that she is working on with physical therapy. She has mild gastritis. She is concerned about her right nipple area and once been to look at that. She has a spot on the upper back that she also want to look at. Otherwise a detailed review of systems today was stable  PAST MEDICAL HISTORY: Past Medical History  Diagnosis Date  . Arthritis   . Gout     takes Allopurinol daily and Colchicine daily prn;last attack 23yr ago  . Tachycardia     takes Metoprolol daily  . Hyperlipidemia     takes Pravastatin daily  . Gout   . Breast cancer 2014    ER+/PR+/HEr2-,   . Colon polyps     2 polyps by report  . Rhinitis     uses Flonase prn  . Insomnia     takes Ambien nightly prn  . Anxiety     takes Ativan daily prn  . Peripheral edema     takes Furosemide daily prn  . PONV (postoperative nausea and vomiting)   . History of bronchitis     last time at least 854yrago  . Osteoarthritis   . Bruises easily   . GERD (gastroesophageal reflux disease)     occasionally takes Nexium   . Diverticulosis   . History of bladder infections  many yrs ago  . Allergy   . Radiation 07/27/13-09/07/13    Right Breast x 31 treatments  . Ventricular tachycardia, non-sustained     during sleep study 2009 with normal cardiac workup  . OSA (obstructive sleep apnea)     on CPAP  . Sinus congestion   . Postmenopausal hormone therapy   . History of stress incontinence   . Status post breast reconstruction 10/15/14    Bilateral implant removal and DIEP performed in [place], [place]    PAST SURGICAL HISTORY: Past Surgical History  Procedure Laterality Date  . Appendectomy    . Gallbladder surgery    . Cholecystectomy    . Cosmetic surgery    . Tonsillectomy    .  Abdominal hysterectomy    . Cardiac catheterization  1999  . Colonoscopy    . Bilateral cataract surgery    . Urinary tightening      d/t urinary incontinence  . Total mastectomy Bilateral 05/23/2013    WITH RECONSTRUCTION  . Total mastectomy Bilateral 05/23/2013    Procedure: TOTAL MASTECTOMY;  Surgeon: [name], MD;  Location: [place];  Service: General;  Laterality: Bilateral;  . Axillary sentinel node biopsy Right 05/23/2013    Procedure: AXILLARY SENTINEL NODE BIOPSY;  Surgeon: [name], MD;  Location: [place];  Service: General;  Laterality: Right;  nuc med injection 7:00  . Breast reconstruction with placement of tissue expander and flex hd (acellular hydrated dermis) Bilateral 05/23/2013    Procedure: BILATERAL BREAST RECONSTRUCTION WITH PLACEMENT OF TISSUE EXPANDER AND FLEX HD;  Surgeon: [name], MD;  Location: [place];  Service: Plastics;  Laterality: Bilateral;  . Breast reconstruction Bilateral 05/17/2014    W  . Removal of bilateral tissue expanders with placement of bilateral breast implants Bilateral 05/17/2014    Procedure: REMOVAL OF BILATERAL TISSUE EXPANDERS WITH PLACEMENT OF BILATERAL BREAST IMPLANTS FOR RECONSTRUCTION;  Surgeon: [name], MD;  Location: [place];  Service: Plastics;  Laterality: Bilateral;    FAMILY HISTORY Family History  Problem Relation Age of Onset  . Breast cancer Mother     possible inflammatory breast cancer  . Heart attack Father   . Heart disease Father   . ALS Sister   . Breast cancer Maternal Grandmother 8  . Heart attack Maternal Grandfather   . Heart attack Paternal Grandfather   . Breast cancer Maternal Aunt     dx in her 65s  . Breast cancer Other     maternal great grandmother; dx in her 77s   the patient's father died from heart disease at the age of 20. The patient's mother died from breast cancer at the age of 30. It is not clear when she was diagnosed since she "dictated and". The patient had no brothers. One  sister died from [place] at the age of 59 in addition, one of the patient's mother is sisters was diagnosed with breast cancer in her 65s. The patient's mother is mother, was also diagnosed with breast cancer, at the age of 48. The patient's daughter, [name], has been tested for the BRCA genes and was negative.  GYNECOLOGIC HISTORY:  Menarche age 54, first live birth age 80, the patient is [place] P2. She underwent total abdominal hysterectomy and bilateral salpingo-oophorectomy at the age of 69 for endometriosis. She took hormone replacement until August of 2014.  SOCIAL HISTORY:  Gayatri recently moved to [facility] and is planning to make this area her "home-base", although they also have a  home in [place] where they go for the winter to ski. 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 They are now enjoying their retirement. Daughter [name] lives in [place]. A younger daughter [name] is a homemaker in [place]. The patient has 3 grandchildren. She is a Furniture conservator/restorer.    ADVANCED DIRECTIVES: In place   HEALTH MAINTENANCE: History  Substance Use Topics  . Smoking status: Former Smoker -- 1.00 packs/day for 20 years    Start date: 06/09/1992  . Smokeless tobacco: Never Used     Comment: quit at age 30  . Alcohol Use: 3.0 oz/week    5 Glasses of wine per week     Comment: nightly     Colonoscopy: 2010  PAP: August 2014  Bone density: 2010, normal per patient  Lipid panel:  Allergies  Allergen Reactions  . Ivp Dye [Iodinated Diagnostic Agents] Hives  . Sulfa Antibiotics Swelling    Current Outpatient Prescriptions  Medication Sig Dispense Refill  . allopurinol (ZYLOPRIM) 100 MG tablet TAKE 1 TABLET BY MOUTH DAILY 90 tablet 0  . allopurinol (ZYLOPRIM) 100 MG tablet Take 1 tablet (100 mg total) by mouth daily. 90 tablet 3  . anastrozole (ARIMIDEX) 1 MG tablet TAKE 1 TABLET BY MOUTH EVERY DAY 90 tablet  3  . calcium-vitamin D (OSCAL WITH D) 250-125 MG-UNIT per tablet Take 2 tablets by mouth at bedtime.     . cephALEXin (KEFLEX) 500 MG capsule Take 1 capsule (500 mg total) by mouth every 8 (eight) hours. 30 capsule 0  . colchicine 0.6 MG tablet Take 1 tablet (0.6 mg total) by mouth as needed. 30 tablet 0  . Cyanocobalamin (VITAMIN B-12 IJ) Inject 1,000 mcg as directed every 30 (thirty) days. Normally 1st of the month    . enoxaparin (LOVENOX) 40 MG/0.4ML injection Inject 0.4 mLs (40 mg total) into the skin daily. 12 Syringe 0  . esomeprazole (NEXIUM) 40 MG capsule TAKE 1 CAPSULE BY MOUTH EVERY DAY AT 12 NOON 30 capsule 6  . fluticasone (FLONASE) 50 MCG/ACT nasal spray Place 1 spray into the nose daily as needed for rhinitis.     . furosemide (LASIX) 40 MG tablet Take 40 mg by mouth daily as needed for fluid.     Marland Kitchen HYDROmorphone (DILAUDID) 2 MG tablet Take 1 tablet (2 mg total) by mouth every 4 (four) hours as needed for moderate pain. 30 tablet 0  . ketoconazole (NIZORAL) 2 % cream Apply 1 application topically daily. 15 g 0  . LORazepam (ATIVAN) 0.5 MG tablet Take 0.5 mg by mouth at bedtime as needed for anxiety (or sleep).    . menthol-cetylpyridinium (CEPACOL) 3 MG lozenge Take 1 lozenge (3 mg total) by mouth as needed for sore throat. 100 tablet 12  . methocarbamol (ROBAXIN) 500 MG tablet Take 1 tablet (500 mg total) by mouth every 6 (six) hours as needed for muscle spasms. 30 tablet 1  . pravastatin (PRAVACHOL) 40 MG tablet TAKE 1 TABLET BY MOUTH DAILY 90 tablet 0  . testosterone cypionate (DEPOTESTOTERONE CYPIONATE) 200 MG/ML injection Inject 200 mg into the muscle every 30 (thirty) days. Normally 1st of the month    . venlafaxine XR (EFFEXOR-XR) 150 MG 24 hr capsule Take 1 capsule (150 mg total) by mouth daily with breakfast. 90 capsule 4   No current facility-administered medications for this visit.    OBJECTIVE: Middle-aged white woman who appears  stated age 69 Vitals:   01/09/15  1402  BP: 145/66  Pulse: 96  Temp: 97.5 F (36.4 C)  Resp: 18     Body mass index is 25.21 kg/(m^2).    ECOG FS:1 - Symptomatic but completely ambulatory Filed Weights   01/09/15 1402  Weight: 142 lb 4.8 oz (64.547 kg)   Sclerae unicteric, EOMs intact Oropharynx clear, dentition in good repair No cervical or supraclavicular adenopathy Lungs no rales or rhonchi Heart regular rate and rhythm Abd soft, nontender, positive bowel sounds MSK no focal spinal tenderness, no upper extremity lymphedema Neuro: nonfocal, well oriented, appropriate affect Breasts: Status post bilateral DI AP reconstruction. The cosmetic result is excellent. The incisions are healing very nicely, with some eschar in the underside of the left nipple, photographed below. There is no evidence of disease recurrence. Both axillae are benign Skin: There is a 3 mm hard subcutaneous mass in the right medial posterior shoulder. There is no associated erythema tenderness or swelling  Photo 01/09/2015, head to R of photo    LAB RESULTS:   Lab Results  Component Value Date   WBC 5.1 01/01/2015   NEUTROABS 2.8 01/01/2015   HGB 13.4 01/01/2015   HCT 40.5 01/01/2015   MCV 89.0 01/01/2015   PLT 206 01/01/2015      Chemistry      Component Value Date/Time   NA 142 01/01/2015 1422   NA 137 05/09/2014 1231   K 4.5 01/01/2015 1422   K 4.0 05/09/2014 1231   CL 99 05/09/2014 1231   CO2 26 01/01/2015 1422   CO2 20 05/09/2014 1231   BUN 20.6 01/01/2015 1422   BUN 14 05/09/2014 1231   CREATININE 1.0 01/01/2015 1422   CREATININE 0.82 05/09/2014 1231      Component Value Date/Time   CALCIUM 9.8 01/01/2015 1422   CALCIUM 9.5 05/09/2014 1231   ALKPHOS 94 01/01/2015 1422   ALKPHOS 71 05/16/2013 1540   AST 21 01/01/2015 1422   AST 23 05/16/2013 1540   ALT 22 01/01/2015 1422   ALT 22 05/16/2013 1540   BILITOT 0.40 01/01/2015 1422   BILITOT 0.4 05/16/2013 1540       STUDIES: No results found.   ASSESSMENT:  69 y.o. BRCA 1 & 2 negative  woman   (1)  status post right breast upper outer quadrant biopsy 03/24/2013 for a pT1b cN0, stage IA invasive ductal carcinoma, grade 1, strongly estrogen and progesterone receptor positive, HER-2 negative  (2) status post bilateral mastectomies 05/23/2013 showing:  (a) on the right, a pT1b pN0, stage invasive ductal carcinoma, grade 1, again HER-2 negative  (b) on the left, no evidence of malignancy  (3) Oncotype DX score of 20 predicted a rate of distant recurrence within 10 years of 13% if the patient's only systemic treatment was tamoxifen for 5 years.  (4) a positive margin required adjuvant radiation, completed January 2015 in [place]  (5) status post bilateral implant placement January 2015  (a) with impending right implant rupture February 2016 underwent bilateral DIEP reconstruction under Dr. Kandyce Rud in [place] (faxed [phone])  (6)  started on anastrozole, mid September 2014, prior to definitive surgery; continued postop; bone density 07/31/2013 was normal    PLAN: Kaiden is very pleased with her Diep flap results. She has an area of incomplete healing in the inferior aspect of the left nipple. This does not look devascularized but it is struggling a little. Possibly with a little bit more time this will heal over,  but we are sending a photo (with this dictation) to Dr. Jimmye Norman in [place] for his comments  I think the small subcutaneous 3 mm lesion in the upper right posterior back is likely a benign acanthoma, but I am sending Su to Dr. Clarise Cruz for further evaluation and possible excision.  We normally would repeat a bone density this December but she will be in [place] at the time and she would prefer to have it done here. Accordingly she'll have her next bone density in May 2017 and I will see her at that time.  We discussed the cost of anastrozole and it should be approximately 1/10 of what she currently has been  painting. I went ahead and wrote her a handwritten prescription so she can shop it. She will let me know she has difficulty finding this drug for 10 or $12 a month at most. The plan continues to be to continue anastrozole for a total of 5 years  Otherwise she will see me again next may. She knows to call for any problems that may develop before that She knows to call for any problems that may develop before her next visit here.  Chauncey Cruel, MD   01/09/2015 2:12 PM

## 2015-01-09 NOTE — Therapy (Signed)
[place], [place], [address] Phone: [phone]   Fax:  [phone]  Physical Therapy Treatment  Patient Details  Name: Tracey [name] MRN: [id number] Date of Birth: 1946/03/05 Referring Provider:  Kandyce Rud, MD  Encounter Date: 01/09/2015      PT End of Session - 01/09/15 1710    Visit Number 9   Number of Visits 16   Date for PT Re-Evaluation 02/14/15   PT Start Time 1532   PT Stop Time 1606   PT Time Calculation (min) 34 min      Past Medical History  Diagnosis Date  . Arthritis   . Gout     takes Allopurinol daily and Colchicine daily prn;last attack 75yr ago  . Tachycardia     takes Metoprolol daily  . Hyperlipidemia     takes Pravastatin daily  . Gout   . Breast cancer 2014    ER+/PR+/HEr2-,   . Colon polyps     2 polyps by report  . Rhinitis     uses Flonase prn  . Insomnia     takes Ambien nightly prn  . Anxiety     takes Ativan daily prn  . Peripheral edema     takes Furosemide daily prn  . PONV (postoperative nausea and vomiting)   . History of bronchitis     last time at least 842yrago  . Osteoarthritis   . Bruises easily   . GERD (gastroesophageal reflux disease)     occasionally takes Nexium   . Diverticulosis   . History of bladder infections     many yrs ago  . Allergy   . Radiation 07/27/13-09/07/13    Right Breast x 31 treatments  . Ventricular tachycardia, non-sustained     during sleep study 2009 with normal cardiac workup  . OSA (obstructive sleep apnea)     on CPAP  . Sinus congestion   . Postmenopausal hormone therapy   . History of stress incontinence   . Status post breast reconstruction 10/15/14    Bilateral implant removal and DIEP performed in [place]    Past Surgical History  Procedure Laterality Date  . Appendectomy    . Gallbladder surgery    . Cholecystectomy    . Cosmetic surgery    . Tonsillectomy    . Abdominal  hysterectomy    . Cardiac catheterization  1999  . Colonoscopy    . Bilateral cataract surgery    . Urinary tightening      d/t urinary incontinence  . Total mastectomy Bilateral 05/23/2013    WITH RECONSTRUCTION  . Total mastectomy Bilateral 05/23/2013    Procedure: TOTAL MASTECTOMY;  Surgeon: ToOdis HollingsheadMD;  [facility] Service: General;  Laterality: Bilateral;  . Axillary sentinel node biopsy Right 05/23/2013    Procedure: AXILLARY SENTINEL NODE BIOPSY;  Surgeon: ToOdis HollingsheadMD;  [facility] Service: General;  Laterality: Right;  nuc med injection 7:00  . Breast reconstruction with placement of tissue expander and flex hd (acellular hydrated dermis) Bilateral 05/23/2013    Procedure: BILATERAL BREAST RECONSTRUCTION WITH PLACEMENT OF TISSUE EXPANDER AND FLEX HD;  Surgeon: DaCrissie ReeseMD;  [facility] Service: Plastics;  Laterality: Bilateral;  . Breast reconstruction Bilateral 05/17/2014    W  . Removal of bilateral tissue expanders with placement of bilateral breast implants Bilateral 05/17/2014    Procedure: REMOVAL OF BILATERAL TISSUE EXPANDERS WITH PLACEMENT OF  BILATERAL BREAST IMPLANTS FOR RECONSTRUCTION;  Surgeon: [name], MD;  Location: [place];  Service: Plastics;  Laterality: Bilateral;    There were no vitals filed for this visit.  Visit Diagnosis:  Decreased right shoulder range of motion  Swelling in chest  Pain in joint, shoulder region, right  Status post bilateral breast reconstruction      Subjective Assessment - 01/09/15 1632    Subjective Running late from 6 month check up with Dr. Jana Hakim, everything looks great and dont have to go back for a year! The patch really helped my pain, took it off yesterday morning.    Currently in Pain? Yes   Pain Score 5    Pain Location Shoulder   Pain Orientation Right   Pain Descriptors / Indicators Aching   Aggravating Factors  laying on it; certain positions   Pain Relieving Factors  iontophoresis                         [facility] Adult PT Treatment/Exercise - 01/09/15 0001    Iontophoresis   Type of Iontophoresis Dexamethasone   Location right shoulder from subacromial space toward biceps tendon insertion   Dose 1 ml   Time STAT patch x 6 hours   Manual Therapy   Myofascial Release At Rt axilla during PROM and UE pulling at end PROM's   Manual Lymphatic Drainage (MLD) In supine, short neck, left axilla and anterior interaxillary anastomosis, right groin and axillo-inguinal anastomosis focusing near axilla at area of most swelling.   Passive ROM right shoulder in supine for flexion, abduction, and ER                   Short Term Clinic Goals - 12/31/14 1225    CC Short Term Goal  #1   Title Patient will be able to demonstrate initial home exercise program safely    Status On-going   CC Short Term Goal  #2   Title Patient will be able to increase right shoulder active flexion to >/= 145 degrees for increased ease with reaching   Status On-going   CC Short Term Goal  #3   Title Patient will be able to increase right shoulder active internal rotation to >/= 50 degrees for increased ease scratching her back   Status On-going   CC Short Term Goal  #4   Title Patient will be able to report >/= 25% improvement in right chest swelling for increased comfort and improved cosmesis   Status On-going             Long Term Clinic Goals - 12/31/14 1225    CC Long Term Goal  #1   Title Patient will be able to increase right shoulder active flexion to >/= 155 degrees for increased ease withr reaching   Status On-going   CC Long Term Goal  #2   Title Patient will be able to increase right shoulder active internal rotation to >/= 60 degrees for increased ease scratching her back   Status On-going   CC Long Term Goal  #3   Title Patient will be able to report >/= 50% less swelling present in her right chest for incerased comfort and improved  cosmesis.   Status On-going            Plan - 01/09/15 1711    Clinical Impression Statement Pt was pleased with initial results of iontophoresis and would like to continue use of patch.  She was running late from her doctor appointment today so did not get to assess goals, though pts reports of swelling at Rt axilla/chest swelling continue to improve as does her functional ROM.   Pt will benefit from skilled therapeutic intervention in order to improve on the following deficits Decreased strength;Impaired UE functional use;Increased edema;Decreased range of motion   Rehab Potential Excellent   Clinical Impairments Affecting Rehab Potential none   PT Frequency 3x / week   PT Duration 8 weeks   PT Treatment/Interventions Manual techniques;Passive range of motion;Therapeutic exercise;Patient/family education;Manual lymph drainage   PT Next Visit Plan Remeasure ROM.  Continue manual, PROM, exercise instruction, iontophoresis.   Consulted and Agree with Plan of Care Patient        Problem List Patient Active Problem List   Diagnosis Date Noted  . CAD (coronary artery disease), native coronary artery 01/01/2014  . Chest pain 01/01/2014  . Hypertension   . Ventricular tachycardia, non-sustained   . OSA (obstructive sleep apnea)   . Breast cancer of upper-outer quadrant of right female breast 06/28/2013  . Postoperative visit 06/02/2013  . Gout   . Tachycardia     Otelia Limes, PTA 01/09/2015, 5:14 PM  [place], [place], [address] Phone: [phone]   Fax:  [phone]

## 2015-01-09 NOTE — Telephone Encounter (Signed)
Mailed calendar for December 2016 & May 2017.

## 2015-01-11 ENCOUNTER — Ambulatory Visit: Payer: Medicare Other | Admitting: Physical Therapy

## 2015-01-11 DIAGNOSIS — R222 Localized swelling, mass and lump, trunk: Secondary | ICD-10-CM

## 2015-01-11 DIAGNOSIS — M25511 Pain in right shoulder: Secondary | ICD-10-CM

## 2015-01-11 DIAGNOSIS — M25611 Stiffness of right shoulder, not elsewhere classified: Secondary | ICD-10-CM

## 2015-01-11 DIAGNOSIS — Z9889 Other specified postprocedural states: Secondary | ICD-10-CM | POA: Diagnosis not present

## 2015-01-11 NOTE — Therapy (Signed)
[place], [place], [address] Phone: [phone]   Fax:  [phone]  Physical Therapy Treatment  Patient Details  Name: Tracey [name] MRN: [id number] Date of Birth: 12-11-1945 Referring Provider:  Kandyce Rud, MD  Encounter Date: 01/11/2015      PT End of Session - 01/11/15 1253    Visit Number 10   Number of Visits 16   Date for PT Re-Evaluation 02/14/15   PT Start Time 0932   PT Stop Time 1015   PT Time Calculation (min) 43 min      Past Medical History  Diagnosis Date  . Arthritis   . Gout     takes Allopurinol daily and Colchicine daily prn;last attack 3yr ago  . Tachycardia     takes Metoprolol daily  . Hyperlipidemia     takes Pravastatin daily  . Gout   . Breast cancer 2014    ER+/PR+/HEr2-,   . Colon polyps     2 polyps by report  . Rhinitis     uses Flonase prn  . Insomnia     takes Ambien nightly prn  . Anxiety     takes Ativan daily prn  . Peripheral edema     takes Furosemide daily prn  . PONV (postoperative nausea and vomiting)   . History of bronchitis     last time at least 864yrago  . Osteoarthritis   . Bruises easily   . GERD (gastroesophageal reflux disease)     occasionally takes Nexium   . Diverticulosis   . History of bladder infections     many yrs ago  . Allergy   . Radiation 07/27/13-09/07/13    Right Breast x 31 treatments  . Ventricular tachycardia, non-sustained     during sleep study 2009 with normal cardiac workup  . OSA (obstructive sleep apnea)     on CPAP  . Sinus congestion   . Postmenopausal hormone therapy   . History of stress incontinence   . Status post breast reconstruction 10/15/14    Bilateral implant removal and DIEP performed in [place]    Past Surgical History  Procedure Laterality Date  . Appendectomy    . Gallbladder surgery    . Cholecystectomy    . Cosmetic surgery    . Tonsillectomy    . Abdominal  hysterectomy    . Cardiac catheterization  1999  . Colonoscopy    . Bilateral cataract surgery    . Urinary tightening      d/t urinary incontinence  . Total mastectomy Bilateral 05/23/2013    WITH RECONSTRUCTION  . Total mastectomy Bilateral 05/23/2013    Procedure: TOTAL MASTECTOMY;  Surgeon: ToOdis HollingsheadMD;  [facility]: General;  Laterality: Bilateral;  . Axillary sentinel node biopsy Right 05/23/2013    Procedure: AXILLARY SENTINEL NODE BIOPSY;  Surgeon: ToOdis HollingsheadMD;  [facility] Service: General;  Laterality: Right;  nuc med injection 7:00  . Breast reconstruction with placement of tissue expander and flex hd (acellular hydrated dermis) Bilateral 05/23/2013    Procedure: BILATERAL BREAST RECONSTRUCTION WITH PLACEMENT OF TISSUE EXPANDER AND FLEX HD;  Surgeon: DaCrissie ReeseMD;  [facility] Service: Plastics;  Laterality: Bilateral;  . Breast reconstruction Bilateral 05/17/2014    W  . Removal of bilateral tissue expanders with placement of bilateral breast implants Bilateral 05/17/2014    Procedure: REMOVAL OF BILATERAL TISSUE EXPANDERS WITH PLACEMENT OF  BILATERAL BREAST IMPLANTS FOR RECONSTRUCTION;  Surgeon: [name], MD;  Location: [facility];  Service: Plastics;  Laterality: Bilateral;    There were no vitals filed for this visit.  Visit Diagnosis:  Decreased right shoulder range of motion  Swelling in chest  Pain in joint, shoulder region, right  Status post bilateral breast reconstruction      Subjective Assessment - 01/11/15 1251    Subjective pt reports she played golf yesterday and her shoulder did not bother her. she had trouble when she was shopping taking clothes up and down from a rack   Currently in Pain? Yes   Pain Score 5   with palpation   Pain Location Shoulder   Pain Orientation Right   Pain Descriptors / Indicators Aching                         [facility] Adult PT Treatment/Exercise - 01/11/15 0001     Shoulder Exercises: Seated   Other Seated Exercises neck and upper thoractic lymphatic flow series    Iontophoresis   Type of Iontophoresis Dexamethasone   Location right shoulder at tender spot at deltoid bursa   Dose 1 ml   Time STAT patch x 6 hours   Manual Therapy   Manual therapy comments cross friction massage at deltoid bursa   Manual Lymphatic Drainage (MLD) In supine, short neck, left axilla and anterior interaxillary anastomosis, right groin and axillo-inguinal anastomosis focusing near axilla at area of most swelling. Also in left sidelying to lateral chest and posterior interaxillary anastamosis                PT Education - 01/11/15 1252    Education provided Yes   Education Details lympmhatic flow series [email or web address]   Person(s) Educated Patient   Methods Explanation;Handout;Demonstration   Comprehension Verbalized understanding;Returned demonstration           Short Term Clinic Goals - 01/11/15 1255    CC Short Term Goal  #1   Title Patient will be able to demonstrate initial home exercise program safely    Status On-going   CC Short Term Goal  #2   Title Patient will be able to increase right shoulder active flexion to >/= 145 degrees for increased ease with reaching   Status On-going   CC Short Term Goal  #3   Title Patient will be able to increase right shoulder active internal rotation to >/= 50 degrees for increased ease scratching her back   Status On-going   CC Short Term Goal  #4   Title Patient will be able to report >/= 25% improvement in right chest swelling for increased comfort and improved cosmesis   Status On-going             Long Term Clinic Goals - 12/31/14 1225    CC Long Term Goal  #1   Title Patient will be able to increase right shoulder active flexion to >/= 155 degrees for increased ease withr reaching   Status On-going   CC Long Term Goal  #2   Title Patient will be able to increase right shoulder active internal  rotation to >/= 60 degrees for increased ease scratching her back   Status On-going   CC Long Term Goal  #3   Title Patient will be able to report >/= 50% less swelling present in her right chest for incerased comfort and improved cosmesis.   Status On-going  Plan - 01/23/15 1253    Clinical Impression Statement Pt pleased with her progress and that she is able to play golf, but she is still has tender area at shoulder with pain upon elevation. Edema at axilla appears better.     PT Next Visit Plan  Remeasure sholder ROM UE ranger next session and progress shoulder exercsie.  continue with ionto.  manual lymph drainage if needed          G-Codes - 2015/01/23 1256    Functional Assessment Tool Used clincial judgement    Functional Limitation Carrying, moving and handling objects   Carrying, Moving and Handling Objects Current Status [phone]) At least 1 percent but less than 20 percent impaired, limited or restricted   Carrying, Moving and Handling Objects Goal Status ([id number]) 0 percent impaired, limited or restricted      Problem List Patient Active Problem List   Diagnosis Date Noted  . CAD (coronary artery disease), native coronary artery 01/01/2014  . Chest pain 01/01/2014  . Hypertension   . Ventricular tachycardia, non-sustained   . OSA (obstructive sleep apnea)   . Breast cancer of upper-outer quadrant of right female breast 06/28/2013  . Postoperative visit 06/02/2013  . Gout   . Tachycardia    Donato Heinz. Owens Shark, PT  01/23/2015, 12:58 PM  [place], [place], [address] Phone: [phone]   Fax:  [phone]

## 2015-01-11 NOTE — Patient Instructions (Signed)
[email or web address] Lymphatic flow series with shoosh lettick crotzer

## 2015-01-15 ENCOUNTER — Ambulatory Visit: Payer: Medicare Other | Admitting: Physical Therapy

## 2015-01-15 DIAGNOSIS — R222 Localized swelling, mass and lump, trunk: Secondary | ICD-10-CM | POA: Diagnosis not present

## 2015-01-15 DIAGNOSIS — Z9889 Other specified postprocedural states: Secondary | ICD-10-CM

## 2015-01-15 DIAGNOSIS — M25511 Pain in right shoulder: Secondary | ICD-10-CM | POA: Diagnosis not present

## 2015-01-15 DIAGNOSIS — M25611 Stiffness of right shoulder, not elsewhere classified: Secondary | ICD-10-CM

## 2015-01-15 NOTE — Therapy (Signed)
[place], [place], [address] Phone: [phone]   Fax:  [phone]  Physical Therapy Treatment  Patient Details  Name: Tracey [name] MRN: [id number] Date of Birth: 07/28/1946 Referring Provider:  Kandyce Rud, MD  Encounter Date: 01/15/2015      PT End of Session - 01/15/15 1743    Visit Number 11   Number of Visits 16   Date for PT Re-Evaluation 02/14/15   PT Start Time 1430   PT Stop Time 1515   PT Time Calculation (min) 45 min      Past Medical History  Diagnosis Date  . Arthritis   . Gout     takes Allopurinol daily and Colchicine daily prn;last attack 57yr ago  . Tachycardia     takes Metoprolol daily  . Hyperlipidemia     takes Pravastatin daily  . Gout   . Breast cancer 2014    ER+/PR+/HEr2-,   . Colon polyps     2 polyps by report  . Rhinitis     uses Flonase prn  . Insomnia     takes Ambien nightly prn  . Anxiety     takes Ativan daily prn  . Peripheral edema     takes Furosemide daily prn  . PONV (postoperative nausea and vomiting)   . History of bronchitis     last time at least 868yrago  . Osteoarthritis   . Bruises easily   . GERD (gastroesophageal reflux disease)     occasionally takes Nexium   . Diverticulosis   . History of bladder infections     many yrs ago  . Allergy   . Radiation 07/27/13-09/07/13    Right Breast x 31 treatments  . Ventricular tachycardia, non-sustained     during sleep study 2009 with normal cardiac workup  . OSA (obstructive sleep apnea)     on CPAP  . Sinus congestion   . Postmenopausal hormone therapy   . History of stress incontinence   . Status post breast reconstruction 10/15/14    Bilateral implant removal and DIEP performed in [place]    Past Surgical History  Procedure Laterality Date  . Appendectomy    . Gallbladder surgery    . Cholecystectomy    . Cosmetic surgery    . Tonsillectomy    . Abdominal  hysterectomy    . Cardiac catheterization  1999  . Colonoscopy    . Bilateral cataract surgery    . Urinary tightening      d/t urinary incontinence  . Total mastectomy Bilateral 05/23/2013    WITH RECONSTRUCTION  . Total mastectomy Bilateral 05/23/2013    Procedure: TOTAL MASTECTOMY;  Surgeon: ToOdis HollingsheadMD;  [facility] Service: General;  Laterality: Bilateral;  . Axillary sentinel node biopsy Right 05/23/2013    Procedure: AXILLARY SENTINEL NODE BIOPSY;  Surgeon: ToOdis HollingsheadMD;  [facility] Service: General;  Laterality: Right;  nuc med injection 7:00  . Breast reconstruction with placement of tissue expander and flex hd (acellular hydrated dermis) Bilateral 05/23/2013    Procedure: BILATERAL BREAST RECONSTRUCTION WITH PLACEMENT OF TISSUE EXPANDER AND FLEX HD;  Surgeon: DaCrissie ReeseMD;  [facility] Service: Plastics;  Laterality: Bilateral;  . Breast reconstruction Bilateral 05/17/2014    W  . Removal of bilateral tissue expanders with placement of bilateral breast implants Bilateral 05/17/2014    Procedure: REMOVAL OF BILATERAL TISSUE EXPANDERS WITH PLACEMENT OF  BILATERAL BREAST IMPLANTS FOR RECONSTRUCTION;  Surgeon: [name], MD;  Location: [facility];  Service: Plastics;  Laterality: Bilateral;    There were no vitals filed for this visit.  Visit Diagnosis:  Decreased right shoulder range of motion  Swelling in chest  Pain in joint, shoulder region, right  Status post bilateral breast reconstruction      Subjective Assessment - 01/15/15 1438    Subjective only had one bad day .Marland KitchenMarland KitchenI just couldn't get in a comfortable  position,. She feels that her swelling is better. She is doing self cross friction massage with some relief   Patient Stated Goals Be able to move her right arm with greater ease.  Return to full normal golf game.                         [facility] Adult PT Treatment/Exercise - 01/15/15 0001    Shoulder Exercises:  Seated   Other Seated Exercises neck and upper thoractic lymphatic flow series    Other Seated Exercises UE ranger.  Pt was able to do exercise below shoulder level, but had pain with shoulder flexion,so treatment was stopped   Cryotherapy   Number Minutes Cryotherapy 10 Minutes   Cryotherapy Location Upper arm  right deltoid bursa   Type of Cryotherapy Ice massage   Iontophoresis   Type of Iontophoresis Dexamethasone   Location right shoulder at tender spot at deltoid bursa   Dose 1 ml   Time STAT patch x 6 hours   Manual Therapy   Manual therapy comments cross friction massage at deltoid bursa                   Short Term Clinic Goals - 01/15/15 1751    CC Short Term Goal  #1   Title Patient will be able to demonstrate initial home exercise program safely    Status On-going   CC Short Term Goal  #2   Title Patient will be able to increase right shoulder active flexion to >/= 145 degrees for increased ease with reaching   Status On-going   CC Short Term Goal  #3   Title Patient will be able to increase right shoulder active internal rotation to >/= 50 degrees for increased ease scratching her back   Status On-going   CC Short Term Goal  #4   Title Patient will be able to report >/= 25% improvement in right chest swelling for increased comfort and improved cosmesis   Status On-going             Long Term Clinic Goals - 01/15/15 1751    CC Long Term Goal  #1   Title Patient will be able to increase right shoulder active flexion to >/= 155 degrees for increased ease withr reaching   Status On-going   CC Long Term Goal  #2   Title Patient will be able to increase right shoulder active internal rotation to >/= 60 degrees for increased ease scratching her back   Status On-going   CC Long Term Goal  #3   Title Patient will be able to report >/= 50% less swelling present in her right chest for incerased comfort and improved cosmesis.   Status On-going             Plan - 01/15/15 1748    Clinical Impression Statement  pt feels that edema is improved, but she is distressed that she has pain in her arm  that keeps her from reaching overhead and interferes with activities Painful arc is present    PT Next Visit Plan  Remeasure sholder ROM progress shoulder closed chain exercsie.  continue with ionto.  manual lymph drainage if needed        Problem List Patient Active Problem List   Diagnosis Date Noted  . CAD (coronary artery disease), native coronary artery 01/01/2014  . Chest pain 01/01/2014  . Hypertension   . Ventricular tachycardia, non-sustained   . OSA (obstructive sleep apnea)   . Breast cancer of upper-outer quadrant of right female breast 06/28/2013  . Postoperative visit 06/02/2013  . Gout   . Tachycardia    Donato Heinz. Owens Shark, PT   01/15/2015, 5:53 PM  [place], [place], [address] Phone: [phone]   Fax:  [phone]

## 2015-01-16 ENCOUNTER — Other Ambulatory Visit: Payer: Self-pay | Admitting: Oncology

## 2015-01-16 NOTE — Telephone Encounter (Signed)
Last OV 01/09/15.  Next OV 01/15/16.  Chart reviewed.

## 2015-01-17 ENCOUNTER — Ambulatory Visit: Payer: Medicare Other | Attending: Oncology | Admitting: Physical Therapy

## 2015-01-17 DIAGNOSIS — M25611 Stiffness of right shoulder, not elsewhere classified: Secondary | ICD-10-CM

## 2015-01-17 DIAGNOSIS — Z9889 Other specified postprocedural states: Secondary | ICD-10-CM | POA: Insufficient documentation

## 2015-01-17 DIAGNOSIS — M25511 Pain in right shoulder: Secondary | ICD-10-CM | POA: Diagnosis not present

## 2015-01-17 DIAGNOSIS — R222 Localized swelling, mass and lump, trunk: Secondary | ICD-10-CM

## 2015-01-17 NOTE — Therapy (Signed)
[place], [place], [address] Phone: [phone]   Fax:  [phone]  Physical Therapy Treatment  Patient Details  Name: Tracey [name] MRN: [id number] Date of Birth: 08-Jan-1946 Referring Provider:  [name], MD  Encounter Date: 01/17/2015      PT End of Session - 01/17/15 1437    Visit Number 12   Number of Visits 16   Date for PT Re-Evaluation 02/14/15   PT Start Time 1346   PT Stop Time 1434   PT Time Calculation (min) 48 min      Past Medical History  Diagnosis Date  . Arthritis   . Gout     takes Allopurinol daily and Colchicine daily prn;last attack 20yr ago  . Tachycardia     takes Metoprolol daily  . Hyperlipidemia     takes Pravastatin daily  . Gout   . Breast cancer 2014    ER+/PR+/HEr2-,   . Colon polyps     2 polyps by report  . Rhinitis     uses Flonase prn  . Insomnia     takes Ambien nightly prn  . Anxiety     takes Ativan daily prn  . Peripheral edema     takes Furosemide daily prn  . PONV (postoperative nausea and vomiting)   . History of bronchitis     last time at least 833yrago  . Osteoarthritis   . Bruises easily   . GERD (gastroesophageal reflux disease)     occasionally takes Nexium   . Diverticulosis   . History of bladder infections     many yrs ago  . Allergy   . Radiation 07/27/13-09/07/13    Right Breast x 31 treatments  . Ventricular tachycardia, non-sustained     during sleep study 2009 with normal cardiac workup  . OSA (obstructive sleep apnea)     on CPAP  . Sinus congestion   . Postmenopausal hormone therapy   . History of stress incontinence   . Status post breast reconstruction 10/15/14    Bilateral implant removal and DIEP performed in [place]    Past Surgical History  Procedure Laterality Date  . Appendectomy    . Gallbladder surgery    . Cholecystectomy    . Cosmetic surgery    . Tonsillectomy    . Abdominal  hysterectomy    . Cardiac catheterization  1999  . Colonoscopy    . Bilateral cataract surgery    . Urinary tightening      d/t urinary incontinence  . Total mastectomy Bilateral 05/23/2013    WITH RECONSTRUCTION  . Total mastectomy Bilateral 05/23/2013    Procedure: TOTAL MASTECTOMY;  Surgeon: ToOdis HollingsheadMD;  [facility] Service: General;  Laterality: Bilateral;  . Axillary sentinel node biopsy Right 05/23/2013    Procedure: AXILLARY SENTINEL NODE BIOPSY;  Surgeon: ToOdis HollingsheadMD;  [facility] Service: General;  Laterality: Right;  nuc med injection 7:00  . Breast reconstruction with placement of tissue expander and flex hd (acellular hydrated dermis) Bilateral 05/23/2013    Procedure: BILATERAL BREAST RECONSTRUCTION WITH PLACEMENT OF TISSUE EXPANDER AND FLEX HD;  Surgeon: DaCrissie ReeseMD;  [facility] Service: Plastics;  Laterality: Bilateral;  . Breast reconstruction Bilateral 05/17/2014    W  . Removal of bilateral tissue expanders with placement of bilateral breast implants Bilateral 05/17/2014    Procedure: REMOVAL OF BILATERAL TISSUE EXPANDERS WITH PLACEMENT OF  BILATERAL BREAST IMPLANTS FOR RECONSTRUCTION;  Surgeon: [name], MD;  Location: [facility];  Service: Plastics;  Laterality: Bilateral;    There were no vitals filed for this visit.  Visit Diagnosis:  Decreased right shoulder range of motion  Swelling in chest  Pain in joint, shoulder region, right      Subjective Assessment - 01/17/15 1430    Subjective It getting worse  It feels "inflamed"    Currently in Pain? Yes   Pain Score 8    Pain Location Shoulder   Pain Orientation Right   Pain Descriptors / Indicators Aching   Aggravating Factors  hanging up something and taking it down, sometimes at rest                          [facility] Adult PT Treatment/Exercise - 01/17/15 0001    Iontophoresis   Type of Iontophoresis Dexamethasone   Location right shoulder at  subacomium   Dose 1 ml   Time STAT patch x 6 hours   Manual Therapy   Manual Lymphatic Drainage (MLD) In supine, short neck, left axilla and anterior interaxillary anastomosis, right groin and axillo-inguinal anastomosis focusing near axilla at area of most swelling.    Passive ROM right shoulder in supine for flexion, abduction, and ER                   Short Term Clinic Goals - 01/15/15 1751    CC Short Term Goal  #1   Title Patient will be able to demonstrate initial home exercise program safely    Status On-going   CC Short Term Goal  #2   Title Patient will be able to increase right shoulder active flexion to >/= 145 degrees for increased ease with reaching   Status On-going   CC Short Term Goal  #3   Title Patient will be able to increase right shoulder active internal rotation to >/= 50 degrees for increased ease scratching her back   Status On-going   CC Short Term Goal  #4   Title Patient will be able to report >/= 25% improvement in right chest swelling for increased comfort and improved cosmesis   Status On-going             Long Term Clinic Goals - 01/15/15 1751    CC Long Term Goal  #1   Title Patient will be able to increase right shoulder active flexion to >/= 155 degrees for increased ease withr reaching   Status On-going   CC Long Term Goal  #2   Title Patient will be able to increase right shoulder active internal rotation to >/= 60 degrees for increased ease scratching her back   Status On-going   CC Long Term Goal  #3   Title Patient will be able to report >/= 50% less swelling present in her right chest for incerased comfort and improved cosmesis.   Status On-going            Plan - 01/17/15 1438    Clinical Impression Statement Shoulder pain is much worse.  Pt planning to call her MD for [facility] and possibly ortho consult today.  Held off on exercise as this seems to have been exacerbating her pain. She feels relieft with stretching  and felt better at end of session   PT Next Visit Plan  Remeasure sholder ROM progress with manual stretching  continue with one more ionto manual lymph  drainage if needed        Problem List Patient Active Problem List   Diagnosis Date Noted  . CAD (coronary artery disease), native coronary artery 01/01/2014  . Chest pain 01/01/2014  . Hypertension   . Ventricular tachycardia, non-sustained   . OSA (obstructive sleep apnea)   . Breast cancer of upper-outer quadrant of right female breast 06/28/2013  . Postoperative visit 06/02/2013  . Gout   . Tachycardia    Donato Heinz. Owens Shark, PT  01/17/2015, 2:42 PM  [place], [place], [address] Phone: [phone]   Fax:  [phone]

## 2015-01-21 DIAGNOSIS — E785 Hyperlipidemia, unspecified: Secondary | ICD-10-CM | POA: Diagnosis not present

## 2015-01-21 DIAGNOSIS — M109 Gout, unspecified: Secondary | ICD-10-CM | POA: Diagnosis not present

## 2015-01-21 DIAGNOSIS — M7541 Impingement syndrome of right shoulder: Secondary | ICD-10-CM | POA: Diagnosis not present

## 2015-01-21 DIAGNOSIS — R8290 Unspecified abnormal findings in urine: Secondary | ICD-10-CM | POA: Diagnosis not present

## 2015-01-21 DIAGNOSIS — M7551 Bursitis of right shoulder: Secondary | ICD-10-CM | POA: Diagnosis not present

## 2015-01-21 DIAGNOSIS — E538 Deficiency of other specified B group vitamins: Secondary | ICD-10-CM | POA: Diagnosis not present

## 2015-01-21 DIAGNOSIS — I10 Essential (primary) hypertension: Secondary | ICD-10-CM | POA: Diagnosis not present

## 2015-01-23 ENCOUNTER — Ambulatory Visit: Payer: Medicare Other

## 2015-01-23 DIAGNOSIS — M25511 Pain in right shoulder: Secondary | ICD-10-CM

## 2015-01-23 DIAGNOSIS — R222 Localized swelling, mass and lump, trunk: Secondary | ICD-10-CM

## 2015-01-23 DIAGNOSIS — M25611 Stiffness of right shoulder, not elsewhere classified: Secondary | ICD-10-CM

## 2015-01-23 DIAGNOSIS — Z9889 Other specified postprocedural states: Secondary | ICD-10-CM

## 2015-01-23 NOTE — Therapy (Signed)
[place], [place], [address] Phone: [phone]   Fax:  [phone]  Physical Therapy Treatment  Patient Details  Name: Tracey [name] MRN: [id number] Date of Birth: 11-07-1945 Referring Provider:  Kandyce Rud, MD  Encounter Date: 01/23/2015      PT End of Session - 01/23/15 1156    Visit Number 13   Number of Visits 16   Date for PT Re-Evaluation 02/14/15   PT Start Time 1108   PT Stop Time 1202   PT Time Calculation (min) 54 min   Activity Tolerance Patient tolerated treatment well   Behavior During Therapy [facility] for tasks assessed/performed      Past Medical History  Diagnosis Date  . Arthritis   . Gout     takes Allopurinol daily and Colchicine daily prn;last attack 3yr ago  . Tachycardia     takes Metoprolol daily  . Hyperlipidemia     takes Pravastatin daily  . Gout   . Breast cancer 2014    ER+/PR+/HEr2-,   . Colon polyps     2 polyps by report  . Rhinitis     uses Flonase prn  . Insomnia     takes Ambien nightly prn  . Anxiety     takes Ativan daily prn  . Peripheral edema     takes Furosemide daily prn  . PONV (postoperative nausea and vomiting)   . History of bronchitis     last time at least 880yrago  . Osteoarthritis   . Bruises easily   . GERD (gastroesophageal reflux disease)     occasionally takes Nexium   . Diverticulosis   . History of bladder infections     many yrs ago  . Allergy   . Radiation 07/27/13-09/07/13    Right Breast x 31 treatments  . Ventricular tachycardia, non-sustained     during sleep study 2009 with normal cardiac workup  . OSA (obstructive sleep apnea)     on CPAP  . Sinus congestion   . Postmenopausal hormone therapy   . History of stress incontinence   . Status post breast reconstruction 10/15/14    Bilateral implant removal and DIEP performed in [place]    Past Surgical History  Procedure Laterality Date  . Appendectomy     . Gallbladder surgery    . Cholecystectomy    . Cosmetic surgery    . Tonsillectomy    . Abdominal hysterectomy    . Cardiac catheterization  1999  . Colonoscopy    . Bilateral cataract surgery    . Urinary tightening      d/t urinary incontinence  . Total mastectomy Bilateral 05/23/2013    WITH RECONSTRUCTION  . Total mastectomy Bilateral 05/23/2013    Procedure: TOTAL MASTECTOMY;  Surgeon: ToOdis HollingsheadMD;  [facility] Service: General;  Laterality: Bilateral;  . Axillary sentinel node biopsy Right 05/23/2013    Procedure: AXILLARY SENTINEL NODE BIOPSY;  Surgeon: ToOdis HollingsheadMD;  [facility] Service: General;  Laterality: Right;  nuc med injection 7:00  . Breast reconstruction with placement of tissue expander and flex hd (acellular hydrated dermis) Bilateral 05/23/2013    Procedure: BILATERAL BREAST RECONSTRUCTION WITH PLACEMENT OF TISSUE EXPANDER AND FLEX HD;  Surgeon: DaCrissie ReeseMD;  [facility] [facility] Service: Plastics;  Laterality: Bilateral;  . Breast reconstruction Bilateral 05/17/2014    W  . Removal of bilateral tissue expanders with placement of  bilateral breast implants Bilateral 05/17/2014    Procedure: REMOVAL OF BILATERAL TISSUE EXPANDERS WITH PLACEMENT OF BILATERAL BREAST IMPLANTS FOR RECONSTRUCTION;  Surgeon: [name], MD;  Location: [place];  Service: Plastics;  Laterality: Bilateral;    There were no vitals filed for this visit.  Visit Diagnosis:  Decreased right shoulder range of motion  Swelling in chest  Pain in joint, shoulder region, right  Status post bilateral breast reconstruction      Subjective Assessment - 01/23/15 1113    Subjective Saw Dr. Onnie Graham for my Rt shoulder on Monday and he gave me an injection and it totally helped! I can lift my arm up now and we felt a pop when he was doing the injection. He wants me to also try electrical stimulation.    Currently in Pain? No/denies   Pain Score 4    Pain Location Shoulder    Pain Orientation Right   Pain Descriptors / Indicators Aching   Aggravating Factors  nothing in the last 2 days   Pain Relieving Factors The shot            [facility] PT Assessment - 01/23/15 0001    AROM   Right Shoulder Flexion 150 Degrees   Right Shoulder ABduction 174 Degrees                     [facility] Adult PT Treatment/Exercise - 01/23/15 0001    Modalities   Modalities Iontophoresis;Retail buyer Location Rt anterior and posterior shoulder   Electrical Stimulation Action Interferential x 15 minutes   Electrical Stimulation Parameters 80-150 Hz   Electrical Stimulation Goals Pain   Manual Therapy   Myofascial Release Rt UE pulling throughout PROM and at axilla    Manual Lymphatic Drainage (MLD) In supine, short neck, left axilla and anterior interaxillary anastomosis, right groin and axillo-inguinal anastomosis focusing near axilla at area of most swelling.    Passive ROM right shoulder in supine for flexion, abduction, and ER                   Short Term Clinic Goals - 01/15/15 1751    CC Short Term Goal  #1   Title Patient will be able to demonstrate initial home exercise program safely    Status On-going   CC Short Term Goal  #2   Title Patient will be able to increase right shoulder active flexion to >/= 145 degrees for increased ease with reaching   Status On-going   CC Short Term Goal  #3   Title Patient will be able to increase right shoulder active internal rotation to >/= 50 degrees for increased ease scratching her back   Status On-going   CC Short Term Goal  #4   Title Patient will be able to report >/= 25% improvement in right chest swelling for increased comfort and improved cosmesis   Status On-going             Long Term Clinic Goals - 01/23/15 1215    CC Long Term Goal  #1   Title Patient will be able to increase right shoulder active flexion to >/= 155 degrees  for increased ease withr reaching  150 degrees attained 01/23/15   Status On-going   CC Long Term Goal  #2   Title Patient will be able to increase right shoulder active internal rotation to >/= 60 degrees for increased ease scratching her back  Status On-going   CC Long Term Goal  #3   Title Patient will be able to report >/= 50% less swelling present in her right chest for incerased comfort and improved cosmesis.   Status On-going            Plan - 01/23/15 1212    Clinical Impression Statement Rt shoulder pain is much improived and pts AROM wsa close to pain free today. Did not do iontophoresis ass eh had a cortisone injection on Monday and isnt having any pain today. Pt tolerated PROM well without any pain today and noticeable increase of end ROM.   Pt will benefit from skilled therapeutic intervention in order to improve on the following deficits Decreased strength;Impaired UE functional use;Increased edema;Decreased range of motion   Rehab Potential Excellent   Clinical Impairments Affecting Rehab Potential none   PT Frequency 3x / week   PT Duration 8 weeks   PT Treatment/Interventions Manual techniques;Passive range of motion;Therapeutic exercise;Patient/family education;Manual lymph drainage;Electrical Stimulation   PT Next Visit Plan  Continue with manual stretching and manual lymph drainage if needed.    Consulted and Agree with Plan of Care Patient        Problem List Patient Active Problem List   Diagnosis Date Noted  . CAD (coronary artery disease), native coronary artery 01/01/2014  . Chest pain 01/01/2014  . Hypertension   . Ventricular tachycardia, non-sustained   . OSA (obstructive sleep apnea)   . Breast cancer of upper-outer quadrant of right female breast 06/28/2013  . Postoperative visit 06/02/2013  . Gout   . Tachycardia     Otelia Limes, PTA 01/23/2015, 12:17 PM  [place], [place], [address] Phone: [phone]   Fax:  [phone]

## 2015-01-23 NOTE — Therapy (Signed)
[place], [place], [address] Phone: [phone]   Fax:  [phone]  Physical Therapy Treatment  Patient Details  Name: Tracey [name] MRN: [id number] Date of Birth: [birth year] Referring Provider:  Kandyce Rud, MD  Encounter Date: 01/23/2015      PT End of Session - 01/23/15 1156    Visit Number 13   Number of Visits 16   Date for PT Re-Evaluation 02/14/15   PT Start Time 1108   PT Stop Time 1202   PT Time Calculation (min) 54 min   Activity Tolerance Patient tolerated treatment well   Behavior During Therapy [facility] for tasks assessed/performed      Past Medical History  Diagnosis Date  . Arthritis   . Gout     takes Allopurinol daily and Colchicine daily prn;last attack 31yr ago  . Tachycardia     takes Metoprolol daily  . Hyperlipidemia     takes Pravastatin daily  . Gout   . Breast cancer 2014    ER+/PR+/HEr2-,   . Colon polyps     2 polyps by report  . Rhinitis     uses Flonase prn  . Insomnia     takes Ambien nightly prn  . Anxiety     takes Ativan daily prn  . Peripheral edema     takes Furosemide daily prn  . PONV (postoperative nausea and vomiting)   . History of bronchitis     last time at least 878yrago  . Osteoarthritis   . Bruises easily   . GERD (gastroesophageal reflux disease)     occasionally takes Nexium   . Diverticulosis   . History of bladder infections     many yrs ago  . Allergy   . Radiation 07/27/13-09/07/13    Right Breast x 31 treatments  . Ventricular tachycardia, non-sustained     during sleep study 2009 with normal cardiac workup  . OSA (obstructive sleep apnea)     on CPAP  . Sinus congestion   . Postmenopausal hormone therapy   . History of stress incontinence   . Status post breast reconstruction 10/15/14    Bilateral implant removal and DIEP performed in [place]    Past Surgical History  Procedure Laterality Date  . Appendectomy     . Gallbladder surgery    . Cholecystectomy    . Cosmetic surgery    . Tonsillectomy    . Abdominal hysterectomy    . Cardiac catheterization  1999  . Colonoscopy    . Bilateral cataract surgery    . Urinary tightening      d/t urinary incontinence  . Total mastectomy Bilateral 05/23/2013    WITH RECONSTRUCTION  . Total mastectomy Bilateral 05/23/2013    Procedure: TOTAL MASTECTOMY;  Surgeon: ToOdis HollingsheadMD;  [facility] Service: General;  Laterality: Bilateral;  . Axillary sentinel node biopsy Right 05/23/2013    Procedure: AXILLARY SENTINEL NODE BIOPSY;  Surgeon: ToOdis HollingsheadMD;  [place] [facility] Service: General;  Laterality: Right;  nuc med injection 7:00  . Breast reconstruction with placement of tissue expander and flex hd (acellular hydrated dermis) Bilateral 05/23/2013    Procedure: BILATERAL BREAST RECONSTRUCTION WITH PLACEMENT OF TISSUE EXPANDER AND FLEX HD;  Surgeon: DaCrissie ReeseMD;  [place] [facility]: Plastics;  Laterality: Bilateral;  . Breast reconstruction Bilateral 05/17/2014    W  . Removal of bilateral tissue expanders with placement of  bilateral breast implants Bilateral 05/17/2014    Procedure: REMOVAL OF BILATERAL TISSUE EXPANDERS WITH PLACEMENT OF BILATERAL BREAST IMPLANTS FOR RECONSTRUCTION;  Surgeon: [name], MD;  Location: [place];  Service: Plastics;  Laterality: Bilateral;    There were no vitals filed for this visit.  Visit Diagnosis:  Decreased right shoulder range of motion - Plan: PT plan of care cert/re-cert  Swelling in chest - Plan: PT plan of care cert/re-cert  Pain in joint, shoulder region, right - Plan: PT plan of care cert/re-cert  Status post bilateral breast reconstruction - Plan: PT plan of care cert/re-cert      Subjective Assessment - 01/23/15 1113    Subjective Saw Dr. Onnie Graham for my Rt shoulder on Monday and he gave me an injection and it totally helped! I can lift my arm up now and we felt a pop when he  was doing the injection. He wants me to also try electrical stimulation.    Currently in Pain? No/denies   Pain Score 4    Pain Location Shoulder   Pain Orientation Right   Pain Descriptors / Indicators Aching   Aggravating Factors  nothing in the last 2 days   Pain Relieving Factors The shot            [facility] PT Assessment - 01/23/15 0001    AROM   Right Shoulder Flexion 150 Degrees   Right Shoulder ABduction 174 Degrees                     [facility] Adult PT Treatment/Exercise - 01/23/15 0001    Modalities   Modalities Iontophoresis;Retail buyer Location Rt anterior and posterior shoulder   Electrical Stimulation Action Interferential x 15 minutes   Electrical Stimulation Parameters 80-150 Hz   Electrical Stimulation Goals Pain   Manual Therapy   Myofascial Release Rt UE pulling throughout PROM and at axilla    Manual Lymphatic Drainage (MLD) In supine, short neck, left axilla and anterior interaxillary anastomosis, right groin and axillo-inguinal anastomosis focusing near axilla at area of most swelling.    Passive ROM right shoulder in supine for flexion, abduction, and ER                   Short Term Clinic Goals - 01/15/15 1751    CC Short Term Goal  #1   Title Patient will be able to demonstrate initial home exercise program safely    Status On-going   CC Short Term Goal  #2   Title Patient will be able to increase right shoulder active flexion to >/= 145 degrees for increased ease with reaching   Status On-going   CC Short Term Goal  #3   Title Patient will be able to increase right shoulder active internal rotation to >/= 50 degrees for increased ease scratching her back   Status On-going   CC Short Term Goal  #4   Title Patient will be able to report >/= 25% improvement in right chest swelling for increased comfort and improved cosmesis   Status On-going             Long  Term Clinic Goals - 01/23/15 1215    CC Long Term Goal  #1   Title Patient will be able to increase right shoulder active flexion to >/= 155 degrees for increased ease withr reaching  150 degrees attained 01/23/15   Status On-going   CC Long Term  Goal  #2   Title Patient will be able to increase right shoulder active internal rotation to >/= 60 degrees for increased ease scratching her back   Status On-going   CC Long Term Goal  #3   Title Patient will be able to report >/= 50% less swelling present in her right chest for incerased comfort and improved cosmesis.   Status On-going            Plan - 01/23/15 1212    Clinical Impression Statement Rt shoulder pain is much improived and pts AROM wsa close to pain free today. Did not do iontophoresis ass eh had a cortisone injection on Monday and isnt having any pain today. Pt tolerated PROM well without any pain today and noticeable increase of end ROM.   Pt will benefit from skilled therapeutic intervention in order to improve on the following deficits Decreased strength;Impaired UE functional use;Increased edema;Decreased range of motion   Rehab Potential Excellent   Clinical Impairments Affecting Rehab Potential none   PT Frequency 3x / week   PT Duration 8 weeks   PT Treatment/Interventions Manual techniques;Passive range of motion;Therapeutic exercise;Patient/family education;Manual lymph drainage;Electrical Stimulation   PT Next Visit Plan  Continue with manual stretching and manual lymph drainage if needed.    Consulted and Agree with Plan of Care Patient        Problem List Patient Active Problem List   Diagnosis Date Noted  . CAD (coronary artery disease), native coronary artery 01/01/2014  . Chest pain 01/01/2014  . Hypertension   . Ventricular tachycardia, non-sustained   . OSA (obstructive sleep apnea)   . Breast cancer of upper-outer quadrant of right female breast 06/28/2013  . Postoperative visit 06/02/2013  . Gout    . Tachycardia     Luddie Boghosian 01/23/2015, 12:30 PM  [place], [place], [address] Phone: [phone]   Fax:  [place], PT 01/23/2015 12:31 PM

## 2015-01-25 ENCOUNTER — Ambulatory Visit: Payer: Medicare Other | Admitting: Physical Therapy

## 2015-01-28 DIAGNOSIS — I890 Lymphedema, not elsewhere classified: Secondary | ICD-10-CM | POA: Diagnosis not present

## 2015-01-28 DIAGNOSIS — Z0000 Encounter for general adult medical examination without abnormal findings: Secondary | ICD-10-CM | POA: Diagnosis not present

## 2015-01-28 DIAGNOSIS — D179 Benign lipomatous neoplasm, unspecified: Secondary | ICD-10-CM | POA: Diagnosis not present

## 2015-01-28 DIAGNOSIS — I10 Essential (primary) hypertension: Secondary | ICD-10-CM | POA: Diagnosis not present

## 2015-01-28 DIAGNOSIS — Z1389 Encounter for screening for other disorder: Secondary | ICD-10-CM | POA: Diagnosis not present

## 2015-01-28 DIAGNOSIS — I2510 Atherosclerotic heart disease of native coronary artery without angina pectoris: Secondary | ICD-10-CM | POA: Diagnosis not present

## 2015-01-28 DIAGNOSIS — E785 Hyperlipidemia, unspecified: Secondary | ICD-10-CM | POA: Diagnosis not present

## 2015-01-28 DIAGNOSIS — G4733 Obstructive sleep apnea (adult) (pediatric): Secondary | ICD-10-CM | POA: Diagnosis not present

## 2015-01-28 DIAGNOSIS — Z23 Encounter for immunization: Secondary | ICD-10-CM | POA: Diagnosis not present

## 2015-01-30 ENCOUNTER — Ambulatory Visit: Payer: Medicare Other

## 2015-01-30 DIAGNOSIS — M25611 Stiffness of right shoulder, not elsewhere classified: Secondary | ICD-10-CM

## 2015-01-30 DIAGNOSIS — Z9889 Other specified postprocedural states: Secondary | ICD-10-CM

## 2015-01-30 DIAGNOSIS — M25511 Pain in right shoulder: Secondary | ICD-10-CM | POA: Diagnosis not present

## 2015-01-30 DIAGNOSIS — R222 Localized swelling, mass and lump, trunk: Secondary | ICD-10-CM

## 2015-01-30 NOTE — Therapy (Signed)
[place], [place], [address] Phone: [phone]   Fax:  [phone]  Physical Therapy Treatment  Patient Details  Name: Tracey [name] MRN: [id number] Date of Birth: [birth year] Referring Provider:  Kandyce Rud, MD  Encounter Date: 01/30/2015      PT End of Session - 01/30/15 1054    Visit Number 14   Number of Visits 16   Date for PT Re-Evaluation 02/14/15   PT Start Time 1020   PT Stop Time 1100   PT Time Calculation (min) 40 min   Activity Tolerance Patient tolerated treatment well   Behavior During Therapy [facility] for tasks assessed/performed      Past Medical History  Diagnosis Date  . Arthritis   . Gout     takes Allopurinol daily and Colchicine daily prn;last attack 63yr ago  . Tachycardia     takes Metoprolol daily  . Hyperlipidemia     takes Pravastatin daily  . Gout   . Breast cancer 2014    ER+/PR+/HEr2-,   . Colon polyps     2 polyps by report  . Rhinitis     uses Flonase prn  . Insomnia     takes Ambien nightly prn  . Anxiety     takes Ativan daily prn  . Peripheral edema     takes Furosemide daily prn  . PONV (postoperative nausea and vomiting)   . History of bronchitis     last time at least 815yrago  . Osteoarthritis   . Bruises easily   . GERD (gastroesophageal reflux disease)     occasionally takes Nexium   . Diverticulosis   . History of bladder infections     many yrs ago  . Allergy   . Radiation 07/27/13-09/07/13    Right Breast x 31 treatments  . Ventricular tachycardia, non-sustained     during sleep study 2009 with normal cardiac workup  . OSA (obstructive sleep apnea)     on CPAP  . Sinus congestion   . Postmenopausal hormone therapy   . History of stress incontinence   . Status post breast reconstruction 10/15/14    Bilateral implant removal and DIEP performed in [place]    Past Surgical History  Procedure Laterality Date  . Appendectomy     . Gallbladder surgery    . Cholecystectomy    . Cosmetic surgery    . Tonsillectomy    . Abdominal hysterectomy    . Cardiac catheterization  1999  . Colonoscopy    . Bilateral cataract surgery    . Urinary tightening      d/t urinary incontinence  . Total mastectomy Bilateral 05/23/2013    WITH RECONSTRUCTION  . Total mastectomy Bilateral 05/23/2013    Procedure: TOTAL MASTECTOMY;  Surgeon: ToOdis HollingsheadMD;  [facility] Service: General;  Laterality: Bilateral;  . Axillary sentinel node biopsy Right 05/23/2013    Procedure: AXILLARY SENTINEL NODE BIOPSY;  Surgeon: ToOdis HollingsheadMD;  [place] [facility] Service: General;  Laterality: Right;  nuc med injection 7:00  . Breast reconstruction with placement of tissue expander and flex hd (acellular hydrated dermis) Bilateral 05/23/2013    Procedure: BILATERAL BREAST RECONSTRUCTION WITH PLACEMENT OF TISSUE EXPANDER AND FLEX HD;  Surgeon: DaCrissie ReeseMD;  [place] [facility] Service: Plastics;  Laterality: Bilateral;  . Breast reconstruction Bilateral 05/17/2014    W  . Removal of bilateral tissue expanders with placement of  bilateral breast implants Bilateral 05/17/2014    Procedure: REMOVAL OF BILATERAL TISSUE EXPANDERS WITH PLACEMENT OF BILATERAL BREAST IMPLANTS FOR RECONSTRUCTION;  Surgeon: [name], MD;  Location: [place];  Service: Plastics;  Laterality: Bilateral;    There were no vitals filed for this visit.  Visit Diagnosis:  Decreased right shoulder range of motion  Swelling in chest  Pain in joint, shoulder region, right  Status post bilateral breast reconstruction      Subjective Assessment - 01/30/15 1026    Subjective I feel great!! The shot really helped, I've been golfing with no problems. The only time I have a little pain is when I reach up high with weight like putting away dishes. I need to leave right at 11 today.   Currently in Pain? No/denies                         [facility]  Adult PT Treatment/Exercise - 01/30/15 0001    Electrical Stimulation   Electrical Stimulation Location Rt anterior and posterior shoulder/upper arm at point of tenderness   Electrical Stimulation Action Interferential x15 minutes   Electrical Stimulation Parameters 80-150 Hz   Electrical Stimulation Goals Pain   Manual Therapy   Myofascial Release Rt UE pulling throughout PROM and at axilla    Manual Lymphatic Drainage (MLD) In supine, short neck, left axilla and anterior interaxillary anastomosis, right groin and axillo-inguinal anastomosis focusing near axilla at area of most swelling.    Passive ROM right shoulder in supine for flexion, abduction, and ER                   Short Term Clinic Goals - 01/15/15 1751    CC Short Term Goal  #1   Title Patient will be able to demonstrate initial home exercise program safely    Status On-going   CC Short Term Goal  #2   Title Patient will be able to increase right shoulder active flexion to >/= 145 degrees for increased ease with reaching   Status On-going   CC Short Term Goal  #3   Title Patient will be able to increase right shoulder active internal rotation to >/= 50 degrees for increased ease scratching her back   Status On-going   CC Short Term Goal  #4   Title Patient will be able to report >/= 25% improvement in right chest swelling for increased comfort and improved cosmesis   Status On-going             Long Term Clinic Goals - 01/23/15 1215    CC Long Term Goal  #1   Title Patient will be able to increase right shoulder active flexion to >/= 155 degrees for increased ease withr reaching  150 degrees attained 01/23/15   Status On-going   CC Long Term Goal  #2   Title Patient will be able to increase right shoulder active internal rotation to >/= 60 degrees for increased ease scratching her back   Status On-going   CC Long Term Goal  #3   Title Patient will be able to report >/= 50% less swelling present in her  right chest for incerased comfort and improved cosmesis.   Status On-going            Plan - 01/30/15 1055    Clinical Impression Statement Pts pain continues to improve and reports increase functional gains at home with reaching to higher shelves with overall less pain,  though still does have some.    Pt will benefit from skilled therapeutic intervention in order to improve on the following deficits Decreased strength;Impaired UE functional use;Increased edema;Decreased range of motion   Rehab Potential Excellent   Clinical Impairments Affecting Rehab Potential none   PT Frequency 3x / week   PT Duration 8 weeks   PT Treatment/Interventions Manual techniques;Passive range of motion;Therapeutic exercise;Patient/family education;Manual lymph drainage;Electrical Stimulation   PT Next Visit Plan Measure ROM/assess goals. Continue with manual stretching and manual lymph drainage prn, use pts 6th/last iontophoresis tomorrow per her request for golf tournament this weekend she has to help with decreasing pain at end ROM's   Consulted and Agree with Plan of Care Patient        Problem List Patient Active Problem List   Diagnosis Date Noted  . CAD (coronary artery disease), native coronary artery 01/01/2014  . Chest pain 01/01/2014  . Hypertension   . Ventricular tachycardia, non-sustained   . OSA (obstructive sleep apnea)   . Breast cancer of upper-outer quadrant of right female breast 06/28/2013  . Postoperative visit 06/02/2013  . Gout   . Tachycardia     Otelia Limes, PTA 01/30/2015, 10:59 AM  [place], [place], [address] Phone: [phone]   Fax:  [phone]

## 2015-01-31 ENCOUNTER — Ambulatory Visit: Payer: Medicare Other

## 2015-01-31 DIAGNOSIS — M25511 Pain in right shoulder: Secondary | ICD-10-CM

## 2015-01-31 DIAGNOSIS — Z9889 Other specified postprocedural states: Secondary | ICD-10-CM

## 2015-01-31 DIAGNOSIS — R222 Localized swelling, mass and lump, trunk: Secondary | ICD-10-CM

## 2015-01-31 DIAGNOSIS — M25611 Stiffness of right shoulder, not elsewhere classified: Secondary | ICD-10-CM

## 2015-01-31 NOTE — Therapy (Signed)
[place], [place], [address] Phone: [phone]   Fax:  [phone]  Physical Therapy Treatment  Patient Details  Name: Tracey [name] MRN: [id number] Date of Birth: August 30, 1945 Referring Provider:  Justice Britain, MD  Encounter Date: 01/31/2015      PT End of Session - 01/31/15 1055    Visit Number 15   Number of Visits 16   Date for PT Re-Evaluation 02/14/15   PT Start Time 1020   PT Stop Time 1107   PT Time Calculation (min) 47 min   Activity Tolerance Patient tolerated treatment well   Behavior During Therapy [facility] for tasks assessed/performed      Past Medical History  Diagnosis Date  . Arthritis   . Gout     takes Allopurinol daily and Colchicine daily prn;last attack 29yr ago  . Tachycardia     takes Metoprolol daily  . Hyperlipidemia     takes Pravastatin daily  . Gout   . Breast cancer 2014    ER+/PR+/HEr2-,   . Colon polyps     2 polyps by report  . Rhinitis     uses Flonase prn  . Insomnia     takes Ambien nightly prn  . Anxiety     takes Ativan daily prn  . Peripheral edema     takes Furosemide daily prn  . PONV (postoperative nausea and vomiting)   . History of bronchitis     last time at least 818yrago  . Osteoarthritis   . Bruises easily   . GERD (gastroesophageal reflux disease)     occasionally takes Nexium   . Diverticulosis   . History of bladder infections     many yrs ago  . Allergy   . Radiation 07/27/13-09/07/13    Right Breast x 31 treatments  . Ventricular tachycardia, non-sustained     during sleep study 2009 with normal cardiac workup  . OSA (obstructive sleep apnea)     on CPAP  . Sinus congestion   . Postmenopausal hormone therapy   . History of stress incontinence   . Status post breast reconstruction 10/15/14    Bilateral implant removal and DIEP performed in [place]    Past Surgical History  Procedure Laterality Date  . Appendectomy     . Gallbladder surgery    . Cholecystectomy    . Cosmetic surgery    . Tonsillectomy    . Abdominal hysterectomy    . Cardiac catheterization  1999  . Colonoscopy    . Bilateral cataract surgery    . Urinary tightening      d/t urinary incontinence  . Total mastectomy Bilateral 05/23/2013    WITH RECONSTRUCTION  . Total mastectomy Bilateral 05/23/2013    Procedure: TOTAL MASTECTOMY;  Surgeon: ToOdis HollingsheadMD;  [facility] Service: General;  Laterality: Bilateral;  . Axillary sentinel node biopsy Right 05/23/2013    Procedure: AXILLARY SENTINEL NODE BIOPSY;  Surgeon: ToOdis HollingsheadMD;  [facility] Service: General;  Laterality: Right;  nuc med injection 7:00  . Breast reconstruction with placement of tissue expander and flex hd (acellular hydrated dermis) Bilateral 05/23/2013    Procedure: BILATERAL BREAST RECONSTRUCTION WITH PLACEMENT OF TISSUE EXPANDER AND FLEX HD;  Surgeon: DaCrissie ReeseMD;  [facility] Service: Plastics;  Laterality: Bilateral;  . Breast reconstruction Bilateral 05/17/2014    W  . Removal of bilateral tissue expanders with placement of  bilateral breast implants Bilateral 05/17/2014    Procedure: REMOVAL OF BILATERAL TISSUE EXPANDERS WITH PLACEMENT OF BILATERAL BREAST IMPLANTS FOR RECONSTRUCTION;  Surgeon: [name], MD;  Location: [facility];  Service: Plastics;  Laterality: Bilateral;    There were no vitals filed for this visit.  Visit Diagnosis:  Decreased right shoulder range of motion  Swelling in chest  Pain in joint, shoulder region, right  Status post bilateral breast reconstruction      Subjective Assessment - 01/31/15 1027    Subjective Felt great after last visit. Do what you did last time! Leaving for my golf trip tomorrow.    Currently in Pain? No/denies            [facility] PT Assessment - 01/31/15 0001    AROM   Right Shoulder Flexion 159 Degrees  Supine   Right Shoulder Internal Rotation 71 Degrees                      [facility] Adult PT Treatment/Exercise - 01/31/15 0001    Electrical Stimulation   Electrical Stimulation Location Rt anterior and posterior shoulder/upper arm at point of tenderness   Electrical Stimulation Action Interferential x15 minutes   Electrical Stimulation Parameters 80-150 Hz   Electrical Stimulation Goals Pain   Iontophoresis   Type of Iontophoresis Dexamethasone   Location right shoulder at subacomium   Dose 1 ml   Time STAT patch x 6 hours   Manual Therapy   Myofascial Release Rt UE pulling throughout PROM and at axilla    Manual Lymphatic Drainage (MLD) In supine, short neck, left axilla and anterior interaxillary anastomosis, right groin and axillo-inguinal anastomosis focusing near axilla at area of most swelling.    Passive ROM right shoulder in supine for flexion, abduction, and ER                   Short Term Clinic Goals - 01/15/15 1751    CC Short Term Goal  #1   Title Patient will be able to demonstrate initial home exercise program safely    Status On-going   CC Short Term Goal  #2   Title Patient will be able to increase right shoulder active flexion to >/= 145 degrees for increased ease with reaching   Status On-going   CC Short Term Goal  #3   Title Patient will be able to increase right shoulder active internal rotation to >/= 50 degrees for increased ease scratching her back   Status On-going   CC Short Term Goal  #4   Title Patient will be able to report >/= 25% improvement in right chest swelling for increased comfort and improved cosmesis   Status On-going             Long Term Clinic Goals - 01/31/15 1100    CC Long Term Goal  #1   Title Patient will be able to increase right shoulder active flexion to >/= 155 degrees for increased ease withr reaching  159 degrees 01/31/15   Status Achieved   CC Long Term Goal  #2   Title Patient will be able to increase right shoulder active internal rotation to >/=  60 degrees for increased ease scratching her back  71 degees 01/31/15   Status Achieved   CC Long Term Goal  #3   Title Patient will be able to report >/= 50% less swelling present in her right chest for incerased comfort and improved cosmesis.  Pt reports  75% improvement 01/31/15   Status Achieved            Plan - 01/31/15 1105    Clinical Impression Statement Pts AROM has improved and she continues to do well and progress her ADLs and golf which she reports is helping with her recovery. She is ready for dishcarge in next visit or 2 per primary poc.   Pt will benefit from skilled therapeutic intervention in order to improve on the following deficits Decreased strength;Impaired UE functional use;Increased edema;Decreased range of motion   Rehab Potential Excellent   Clinical Impairments Affecting Rehab Potential none   PT Frequency 3x / week   PT Duration 8 weeks   PT Treatment/Interventions Manual techniques;Passive range of motion;Therapeutic exercise;Patient/family education;Manual lymph drainage;Electrical Stimulation   PT Next Visit Plan Pt on hold until after her trip. May discharge after that if no flare up after golfing multiple games, but will call and let us know.    Consulted and Agree with Plan of Care Patient        Problem List Patient Active Problem List   Diagnosis Date Noted  . CAD (coronary artery disease), native coronary artery 01/01/2014  . Chest pain 01/01/2014  . Hypertension   . Ventricular tachycardia, non-sustained   . OSA (obstructive sleep apnea)   . Breast cancer of upper-outer quadrant of right female breast 06/28/2013  . Postoperative visit 06/02/2013  . Gout   . Tachycardia     Otelia Limes, PTA 01/31/2015, 11:10 AM  [place], [place], [address] Phone: [phone]   Fax:  [phone]

## 2015-02-10 ENCOUNTER — Ambulatory Visit (INDEPENDENT_AMBULATORY_CARE_PROVIDER_SITE_OTHER): Payer: Medicare Other | Admitting: Internal Medicine

## 2015-02-10 VITALS — BP 124/62 | HR 64 | Temp 97.6°F | Resp 16 | Ht 62.0 in | Wt 142.0 lb

## 2015-02-10 DIAGNOSIS — L03818 Cellulitis of other sites: Secondary | ICD-10-CM | POA: Diagnosis not present

## 2015-02-10 MED ORDER — DOXYCYCLINE HYCLATE 100 MG PO TABS: 100.0000 mg | ORAL_TABLET | Freq: Two times a day (BID) | ORAL | Status: DC

## 2015-02-10 NOTE — Progress Notes (Signed)
   Subjective:    Patient ID: Tracey [name], female    DOB: 02-08-1946, 69 y.o.   MRN: [id number] This chart was scribed for Tami Lin, MD by Zola Button, Medical Scribe. This patient was seen in Room 13 and the patient's care was started at 12:40 PM.   HPI  HPI Comments: Tracey [name] is a 69 y.o. female with a hx of breast cancer who presents to the [facility] complaining of an area of redness and swelling to her right lower leg that started 2 weeks ago. The area started as a circular area with a white, central area. Last night, her daughter tried to lance the area, not finding pus, but did not go very deep. The area has not bothered her much. Patient notes that she walks in the woods every day with her dog and may have been bit by some kind of bug. She does not remember scratching at the area. Patient has been taking Z-pak for a sinus infection.  Patient has been going through treatment for breast cancer for the past 2 years.  PCP: Dr. Ardeth Perfect  Review of Systems Noncontributory    Objective:   Physical Exam  Constitutional: She is oriented to person, place, and time. She appears well-developed and well-nourished. No distress.  HENT:  Head: Normocephalic and atraumatic.  Eyes: Pupils are equal, round, and reactive to light.  Neck: Neck supple.  Cardiovascular: Normal rate.   Pulmonary/Chest: Effort normal.  Musculoskeletal: She exhibits no edema.  Neurological: She is alert and oriented to person, place, and time. No cranial nerve deficit.  Skin: Skin is warm and dry.  There is an area on her right anterior shin of redness 2 cm with central scabbing. There is no fluctuance to suggest abscess formation. There is some irritation of the skin above this wound in a linear fashion like a scrape or a resolving contact-as it is palpable and rough rather than cellulitic. This area is not very tender. 20-gauge needle was used to lancets and only blood was present.  This was cultured.  Psychiatric: She has a normal mood and affect. Her behavior is normal.  Vitals reviewed.      Assessment & Plan:  ? Insect bite with secondary infection and partial response to Zithromax which she is on for sinusitis  Change to doxycycline and if this does not resolve the skin rash/lesion then I would suggest biopsy  I have completed the patient encounter in its entirety as documented by the scribe, with editing by me where necessary. Indie Nickerson P. Laney Pastor, M.D.

## 2015-02-13 LAB — WOUND CULTURE
Gram Stain: NONE SEEN
Organism ID, Bacteria: NO GROWTH

## 2015-02-15 ENCOUNTER — Ambulatory Visit: Payer: Medicare Other | Admitting: Physical Therapy

## 2015-02-15 ENCOUNTER — Telehealth: Payer: Self-pay | Admitting: Radiology

## 2015-02-15 NOTE — Telephone Encounter (Signed)
Pt called about lab results. lmom of nl labs.

## 2015-02-25 DIAGNOSIS — M7551 Bursitis of right shoulder: Secondary | ICD-10-CM | POA: Diagnosis not present

## 2015-02-25 DIAGNOSIS — M7541 Impingement syndrome of right shoulder: Secondary | ICD-10-CM | POA: Diagnosis not present

## 2015-02-27 ENCOUNTER — Ambulatory Visit: Payer: Medicare Other | Admitting: Physical Therapy

## 2015-03-07 DIAGNOSIS — M7541 Impingement syndrome of right shoulder: Secondary | ICD-10-CM | POA: Diagnosis not present

## 2015-03-25 DIAGNOSIS — M7541 Impingement syndrome of right shoulder: Secondary | ICD-10-CM | POA: Diagnosis not present

## 2015-04-01 ENCOUNTER — Other Ambulatory Visit: Payer: Self-pay | Admitting: Oncology

## 2015-04-01 ENCOUNTER — Other Ambulatory Visit: Payer: Self-pay | Admitting: Cardiology

## 2015-04-01 NOTE — Telephone Encounter (Signed)
Further refills to be obtained by primary MD- per chart Dr [name]

## 2015-04-02 ENCOUNTER — Encounter: Payer: Self-pay | Admitting: Neurology

## 2015-04-02 ENCOUNTER — Ambulatory Visit (INDEPENDENT_AMBULATORY_CARE_PROVIDER_SITE_OTHER): Payer: Medicare Other | Admitting: Neurology

## 2015-04-02 ENCOUNTER — Other Ambulatory Visit: Payer: Self-pay | Admitting: Cardiology

## 2015-04-02 VITALS — BP 132/78 | HR 94 | Resp 20 | Ht 62.0 in | Wt 148.0 lb

## 2015-04-02 DIAGNOSIS — G4733 Obstructive sleep apnea (adult) (pediatric): Secondary | ICD-10-CM

## 2015-04-02 DIAGNOSIS — Z1501 Genetic susceptibility to malignant neoplasm of breast: Secondary | ICD-10-CM | POA: Diagnosis not present

## 2015-04-02 DIAGNOSIS — G4737 Central sleep apnea in conditions classified elsewhere: Secondary | ICD-10-CM | POA: Diagnosis not present

## 2015-04-02 DIAGNOSIS — G4731 Primary central sleep apnea: Secondary | ICD-10-CM

## 2015-04-02 DIAGNOSIS — Z9989 Dependence on other enabling machines and devices: Secondary | ICD-10-CM

## 2015-04-02 NOTE — Progress Notes (Addendum)
SLEEP MEDICINE CLINIC   Provider:  Larey Seat, M D  Referring Provider: [name], MD Primary Care Physician:  [name], MD  Chief Complaint  Patient presents with  . sleep consult    rm 10, alone, already on cpap    HPI:  Tracey [name] is a 69 y.o. female , seen here as a referral from Tracey [name] for a transfer of sleep care.  Chief complaint according to patient : " re evaluate my apnea and care."  The patient reports that she moved to the [place] area about 3 years ago and still gets supplies for her established CPAP per Mail. She was diagnosed with obstructive sleep apnea in [place], in [place]. The [facility] posted the sleep center. The patient was diagnosed on 05/02/2008 the study revealed an AHI of 23 was mild decreases in oxygen level as well as continuous snoring. Then CPAP was used for the second night of the split night polysomnography and her oxygen level improved to normal her snoring resolved and the breathing was totally regulated according to Dr. Shelton Silvas note. Concerning was that there was a ventricular tachycardia as of 27 beats. The patient was evaluated by cardiology afterwards the finding could never be reciprocated. She continued to use CPAP for the last 7 years compliantly but she is a little tired of using a nasal mask that has become uncomfortable and his pressure marks on her face. She is also not sure to what degree apnea still present at this time. The patient stays about 7 months of the year here in [place], and 5 months in [place]. Usually they travel from Thanksgiving to Easter. Her husband witnesses snoring when she naps, but not while on CPAP. Her insomnia improved drastically after CPAP.     Sleep habits are as follows: The patient goes to bed between 9.30 and 10 PM, usually falls asleep promptly. She prefers the left side to sleep , but wakes sometimes up on her back.  She learned to sleep on her back  after breast cancer surgery ( diagnosed Aug 8th 2014 ). The bedroom is described as core, quiet and dark. The patient shares a bedroom with her husband. They're also 2 dogs in the bedroom on the bed. She rises usually once to go to the bathroom sometimes twice. She can fall asleep again fairly promptly. She is not a restless sleeper not kicking or moving or not excessively at night. She rises in the morning at 5 sometimes even 8. She usually wakes spontaneously not based on an alarm setting. She averages 7-8 hours of nocturnal sleep.  She reports vivid dreams, "repetitive and in full color".  Some mornings she feels refreshed and restored but not all the time. She is still recovering from her breast reconstruction surgery. She endorsed some snoring, joint pain, aching muscles and even some dizziness. She also has right shoulder bursitis which limits her ability to sleep on the right. She naps 3 out of 7 days, once  daily, a nap will last  2 hours unless she sets an alarm.   [name] this past medical history includes gout, hyperlipidemia, hypertension, palpitations probably due to nonsustained ventricular tachycardia. In addition breast cancer of which her mother maternal aunt maternal grandmother and maternal great-grandmother were also affected. She is a history of allergic rhinitis her sister died in 10-14-11 of ALS.   Sleep medical history and family sleep history: Her father was diagnosed with OSA never used CPAP ,  had an MI at age 24.  Social history: non smoker, quit at age 23. ETOH , 1-2 at night. Caffeine use : none. Decaffeinated tea and coffee.  Average and regular golf player. She likes winter sports, especially skiing but uses the left dangerous slope these days.  Review of Systems: Out of a complete 14 system review, the patient complains of only the following symptoms, and all other reviewed systems are negative. Snoring.   Epworth score 9 , Fatigue severity score 41  , depression score  3    Social History   Social History  . Marital Status: Married    Spouse Name: N/A  . Number of Children: 2  . Years of Education: N/A   Occupational History  . Not on file.   Social History Main Topics  . Smoking status: Former Smoker -- 1.00 packs/day for 20 years    Start date: 06/09/1992  . Smokeless tobacco: Never Used     Comment: quit at age 56  . Alcohol Use: 3.0 oz/week    5 Glasses of wine per week     Comment: nightly  . Drug Use: No     Comment: quit 24 years ago  . Sexual Activity: Yes    Birth Control/ Protection: Surgical   Other Topics Concern  . Not on file   Social History Narrative    Family History  Problem Relation Age of Onset  . Breast cancer Mother     possible inflammatory breast cancer  . Heart attack Father   . Heart disease Father   . ALS Sister   . Breast cancer Maternal Grandmother 47  . Heart attack Maternal Grandfather   . Heart attack Paternal Grandfather   . Breast cancer Maternal Aunt     dx in her 19s  . Breast cancer Other     maternal great grandmother; dx in her 7s    Past Medical History  Diagnosis Date  . Arthritis   . Gout     takes Allopurinol daily and Colchicine daily prn;last attack 71yr ago  . Tachycardia     takes Metoprolol daily  . Hyperlipidemia     takes Pravastatin daily  . Gout   . Breast cancer 2014    ER+/PR+/HEr2-,   . Colon polyps     2 polyps by report  . Rhinitis     uses Flonase prn  . Insomnia     takes Ambien nightly prn  . Anxiety     takes Ativan daily prn  . Peripheral edema     takes Furosemide daily prn  . PONV (postoperative nausea and vomiting)   . History of bronchitis     last time at least 891yrago  . Osteoarthritis   . Bruises easily   . GERD (gastroesophageal reflux disease)     occasionally takes Nexium   . Diverticulosis   . History of bladder infections     many yrs ago  . Allergy   . Radiation 07/27/13-09/07/13    Right Breast x 31 treatments  .  Ventricular tachycardia, non-sustained     during sleep study 2009 with normal cardiac workup  . OSA (obstructive sleep apnea)     on CPAP  . Sinus congestion   . Postmenopausal hormone therapy   . History of stress incontinence   . Status post breast reconstruction 10/15/14    Bilateral implant removal and DIEP performed in [place]    Past Surgical History  Procedure Laterality  Date  . Appendectomy    . Gallbladder surgery    . Cholecystectomy    . Cosmetic surgery    . Tonsillectomy    . Abdominal hysterectomy    . Cardiac catheterization  1999  . Colonoscopy    . Bilateral cataract surgery    . Urinary tightening      d/t urinary incontinence  . Total mastectomy Bilateral 05/23/2013    WITH RECONSTRUCTION  . Total mastectomy Bilateral 05/23/2013    Procedure: TOTAL MASTECTOMY;  Surgeon: [name], MD;  Location: [facility];  Service: General;  Laterality: Bilateral;  . Axillary sentinel node biopsy Right 05/23/2013    Procedure: AXILLARY SENTINEL NODE BIOPSY;  Surgeon: [name], MD;  Location: [place] [facility];  Service: General;  Laterality: Right;  nuc med injection 7:00  . Breast reconstruction with placement of tissue expander and flex hd (acellular hydrated dermis) Bilateral 05/23/2013    Procedure: BILATERAL BREAST RECONSTRUCTION WITH PLACEMENT OF TISSUE EXPANDER AND FLEX HD;  Surgeon: [name], MD;  Location: [facility];  Service: Plastics;  Laterality: Bilateral;  . Breast reconstruction Bilateral 05/17/2014    W  . Removal of bilateral tissue expanders with placement of bilateral breast implants Bilateral 05/17/2014    Procedure: REMOVAL OF BILATERAL TISSUE EXPANDERS WITH PLACEMENT OF BILATERAL BREAST IMPLANTS FOR RECONSTRUCTION;  Surgeon: [name], MD;  Location: [facility];  Service: Plastics;  Laterality: Bilateral;    Current Outpatient Prescriptions  Medication Sig Dispense Refill  . allopurinol (ZYLOPRIM) 100 MG tablet TAKE 1 TABLET BY MOUTH DAILY 90 tablet 0    . anastrozole (ARIMIDEX) 1 MG tablet TAKE 1 TABLET BY MOUTH EVERY DAY 90 tablet 3  . cholecalciferol 5000 UNITS TABS Take 0.2 tablets (1,000 Units total) by mouth daily. 100 tablet 3  . colchicine 0.6 MG tablet TAKE 1 TABLET BY MOUTH EVERY DAY OR AS NEEDED 30 tablet 0  . Cyanocobalamin (VITAMIN B-12 IJ) Inject 1,000 mcg as directed every 30 (thirty) days. Normally 1st of the month    . esomeprazole (NEXIUM) 40 MG capsule TAKE 1 CAPSULE BY MOUTH EVERY DAY AT NOON 30 capsule 0  . fluticasone (FLONASE) 50 MCG/ACT nasal spray Place 1 spray into the nose daily as needed for rhinitis.     . furosemide (LASIX) 40 MG tablet Take 40 mg by mouth daily as needed for fluid.     Marland Kitchen LORazepam (ATIVAN) 0.5 MG tablet Take 0.5 mg by mouth at bedtime as needed for anxiety (or sleep).    . menthol-cetylpyridinium (CEPACOL) 3 MG lozenge Take 1 lozenge (3 mg total) by mouth as needed for sore throat. 100 tablet 12  . pravastatin (PRAVACHOL) 40 MG tablet TAKE 1 TABLET BY MOUTH DAILY 90 tablet 0  . testosterone cypionate (DEPOTESTOTERONE CYPIONATE) 200 MG/ML injection Inject 200 mg into the muscle every 30 (thirty) days. Normally 1st of the month    . venlafaxine XR (EFFEXOR-XR) 150 MG 24 hr capsule TAKE ONE CAPSULE BY MOUTH DAILY WITH BREAKFAST 90 capsule 3   No current facility-administered medications for this visit.    Allergies as of 04/02/2015 - Review Complete 04/02/2015  Allergen Reaction Noted  . Ivp dye [iodinated diagnostic agents] Hives 06/09/2012  . Sulfa antibiotics Swelling 06/09/2012    Vitals: BP 132/78 mmHg  Pulse 94  Resp 20  Ht 5' 2"  (1.575 m)  Wt 148 lb (67.132 kg)  BMI 27.06 kg/m2 Last Weight:  Wt Readings from Last 1 Encounters:  04/02/15 148 lb (67.132  kg)   ZOX:WRUE mass index is 27.06 kg/(m^2).     Last Height:   Ht Readings from Last 1 Encounters:  04/02/15 5' 2"  (1.575 m)    Physical exam:  General: The patient is awake, alert and appears not in acute distress. The  patient is well groomed. Head: Normocephalic, atraumatic. Neck is supple. Mallampati 4,  neck circumference: 16.25   Nasal airflow unrestricted , TMJ is not  evident. Retrognathia is not seen. She has a crowded lower jar. Cardiovascular:  Regular rate and rhythm , without  murmurs or carotid bruit, and without distended neck veins. Respiratory: Lungs are clear to auscultation. Skin:  Without evidence of edema, or rash Trunk: BMI is 27. The patient's posture is erect.   Neurologic exam : The patient is awake and alert, oriented to place and time.   Memory subjective described as intact.     Attention span & concentration ability appears normal.  Speech is fluent,  without  dysarthria, dysphonia or aphasia.  Mood and affect are appropriate.  Cranial nerves: Pupils are equal and briskly reactive to light. Funduscopic exam without  evidence of pallor or edema. Extraocular movements  in vertical and horizontal planes intact and without nystagmus. Visual fields by finger perimetry are intact. Hearing to finger rub intact.   Facial sensation intact to fine touch.  Facial motor strength is symmetric and tongue and uvula move midline. Shoulder shrug was symmetrical. She is very tense over the shoulder area.   Motor exam:   Normal tone, muscle bulk and symmetric strength in all extremities.  Sensory:  Fine touch, pinprick and vibration were tested in all extremities. Proprioception tested in the upper extremities was normal.  Coordination: Rapid alternating movements in the fingers/hands was normal. Finger-to-nose maneuver  normal without evidence of ataxia, dysmetria or tremor.  Gait and station: Patient walks without assistive device and is able unassisted to climb up to the exam table. Strength within normal limits.  Stance is stable and normal.  Toe and hell stand were tested .Tandem gait is unfragmented. Turns with 4 Steps. Romberg testing is  negative.  Deep tendon reflexes: in the upper  and lower extremities are symmetric and intact. Babinski maneuver response is downgoing.  The patient was advised of the nature of the diagnosed sleep disorder , the treatment options and risks for general a health and wellness arising from not treating the condition.  I spent more than 45 minutes of face to face time with the patient. Greater than 50% of time was spent in counseling and coordination of care. We have discussed the diagnosis and differential and I answered the patient's questions.     Assessment:  After physical and neurologic examination, review of laboratory studies,  Personal review of imaging studies, reports of other /same  Imaging studies, results of polysomnography/ neurophysiology testing and pre-existing records as far as provided in visit,  My Assessment is that of a patient with oxygen need when travelling to her winter home in [place]. She was diagnosed with OSA in [place] , in 2009. She snores when she naps and doesn't use CPAP.  I was able to obtain a compliance report during her office visit. This is dated from 04-01-15, and shows that the patient has used the machine 87% of all nights with over 4 hours of continued use. And 90% of all nights. She has a CPAP EPR setting of 1 cm with a baseline setting of 8 cm water a ramp time of 30 minutes  and a ramp start pressure of only 4 cm water unfortunately it does not allow me to look at an AHI of residual apnea count and this is why I will order a sleep study to reassess the degree of apnea and type of apnea the patient has.  1) likely continues to have OSA. I will order a test to establish her current degree and type of apnea. She may need a special setting that would help her in [place], with altitude induced CSA.Marland Kitchen    2) chest wall pain and tenderness, but not longer on narcotic pain medications. May breath shallow.   3) weight is not a factor - 148 pounds.     Plan:  Treatment plan and additional workup :  SPLIT night  polysomnography, split at [facility] 20 and score at 4 % no CO2 Needed . Follow up with me and we will discuss her memory complaint at that time MOCA  30 minutes.     Asencion Partridge Mckinleigh Schuchart MD  04/02/2015   CC: [name], [address] [place], [place] [address]

## 2015-04-02 NOTE — Patient Instructions (Signed)
Place sleep apnea patient instructions here.

## 2015-04-05 DIAGNOSIS — Z853 Personal history of malignant neoplasm of breast: Secondary | ICD-10-CM | POA: Diagnosis not present

## 2015-04-05 DIAGNOSIS — R221 Localized swelling, mass and lump, neck: Secondary | ICD-10-CM | POA: Diagnosis not present

## 2015-04-09 ENCOUNTER — Ambulatory Visit (INDEPENDENT_AMBULATORY_CARE_PROVIDER_SITE_OTHER): Payer: Medicare Other | Admitting: Neurology

## 2015-04-09 DIAGNOSIS — G4737 Central sleep apnea in conditions classified elsewhere: Secondary | ICD-10-CM | POA: Diagnosis not present

## 2015-04-09 DIAGNOSIS — G4733 Obstructive sleep apnea (adult) (pediatric): Secondary | ICD-10-CM | POA: Diagnosis not present

## 2015-04-09 DIAGNOSIS — G4731 Primary central sleep apnea: Secondary | ICD-10-CM

## 2015-04-09 NOTE — Sleep Study (Signed)
Please see the scanned sleep study interpretation located in the procedure tab in the chart view section.  

## 2015-04-11 ENCOUNTER — Telehealth: Payer: Self-pay

## 2015-04-11 DIAGNOSIS — G4733 Obstructive sleep apnea (adult) (pediatric): Secondary | ICD-10-CM

## 2015-04-11 DIAGNOSIS — Z9989 Dependence on other enabling machines and devices: Secondary | ICD-10-CM

## 2015-04-11 NOTE — Telephone Encounter (Signed)
Called pt with results of sleep study. Advised pt that he study revealed such a low degree of osa that cpap treatment is not the only option. We could treat snoring and mild apnea with a dental device and that the study did not reveal significant PLMS. Pt said she just wants to continue using her cpap. She just wants an order for a dreamwear mask sent to [facility] [facility]. Pt wishes to have Dr. Brett Fairy order a study in the future to test her oxygen in [place]. I advised the pt that this might not be possible for Dr. Brett Fairy to order since it is in [facility]. Pt said she would call back in the winter and discuss with Dr. Brett Fairy. Pt declined a f/u appt at this time.

## 2015-04-25 ENCOUNTER — Telehealth: Payer: Self-pay

## 2015-04-25 NOTE — Addendum Note (Signed)
Addended by: Larey Seat on: 04/25/2015 05:34 PM   Modules accepted: Orders

## 2015-04-25 NOTE — Telephone Encounter (Signed)
Patient called inquiring when the order for dreamwear mask will be placed. Please call and advise. Patient can be reached at [phone].

## 2015-04-25 NOTE — Telephone Encounter (Signed)
The order is for several days  the system - check please why her dream wear mask is not yet issued. Needs to give [place] the name of her DME to check. CD

## 2015-04-26 ENCOUNTER — Telehealth: Payer: Self-pay

## 2015-04-26 NOTE — Telephone Encounter (Signed)
She gets supplies by mail, needs a local DME or the order can be send to her mail order supplier-

## 2015-04-26 NOTE — Telephone Encounter (Signed)
See previous note

## 2015-04-26 NOTE — Telephone Encounter (Signed)
Rn left vm for patient. Rn explain on vm we need to know what company she is using for DME. Rn gave number for [facility] to call back on Monday.Rn left message stating order is in for mask.

## 2015-04-26 NOTE — Telephone Encounter (Signed)
Rn left vm for patient. Rn explain on vm we need to know what company she is using for  DME. Rn gave number for [facility] to call back on Monday.Rn left message stating order is in for mask.

## 2015-04-29 ENCOUNTER — Other Ambulatory Visit: Payer: Self-pay | Admitting: General Surgery

## 2015-04-29 DIAGNOSIS — D170 Benign lipomatous neoplasm of skin and subcutaneous tissue of head, face and neck: Secondary | ICD-10-CM | POA: Diagnosis not present

## 2015-04-29 DIAGNOSIS — M7989 Other specified soft tissue disorders: Secondary | ICD-10-CM | POA: Diagnosis not present

## 2015-04-29 DIAGNOSIS — D1739 Benign lipomatous neoplasm of skin and subcutaneous tissue of other sites: Secondary | ICD-10-CM | POA: Diagnosis not present

## 2015-04-29 NOTE — Telephone Encounter (Signed)
I sent to order to the [facility] on 04/11/15 and she acknowledged then that the order was received and being processed.   I spoke to pt and advised her of this and that I would contact [place] again to see what the hold up is. Pt verbalized understanding.

## 2015-04-29 NOTE — Telephone Encounter (Signed)
Called pt to tell her that [place] informed me that they did not have the mask in stock so they had to order it. They apologized for not informing the pt and they would try and expedite the order.  No answer, left a message asking her to call me back.

## 2015-04-29 NOTE — Telephone Encounter (Signed)
Patient returned call. Per Cyril Mourning, I advised patient that [employer] and is having her mask expedited. Cyril Mourning will have [facility] give patient a call.

## 2015-05-17 ENCOUNTER — Telehealth: Payer: Self-pay | Admitting: Nurse Practitioner

## 2015-05-17 ENCOUNTER — Encounter: Payer: Self-pay | Admitting: Nurse Practitioner

## 2015-05-17 ENCOUNTER — Telehealth: Payer: Self-pay | Admitting: *Deleted

## 2015-05-17 ENCOUNTER — Ambulatory Visit (HOSPITAL_COMMUNITY)
Admission: RE | Admit: 2015-05-17 | Discharge: 2015-05-17 | Disposition: A | Discharge status: Home / Self Care | Payer: Medicare Other | Source: Ambulatory Visit | Attending: Nurse Practitioner | Admitting: Nurse Practitioner

## 2015-05-17 ENCOUNTER — Ambulatory Visit (HOSPITAL_BASED_OUTPATIENT_CLINIC_OR_DEPARTMENT_OTHER): Payer: Medicare Other | Admitting: Nurse Practitioner

## 2015-05-17 VITALS — BP 145/71 | HR 84 | Temp 98.0°F | Resp 18 | Ht 63.0 in | Wt 149.0 lb

## 2015-05-17 DIAGNOSIS — C50919 Malignant neoplasm of unspecified site of unspecified female breast: Secondary | ICD-10-CM | POA: Insufficient documentation

## 2015-05-17 DIAGNOSIS — Z79811 Long term (current) use of aromatase inhibitors: Secondary | ICD-10-CM

## 2015-05-17 DIAGNOSIS — R609 Edema, unspecified: Secondary | ICD-10-CM | POA: Diagnosis not present

## 2015-05-17 DIAGNOSIS — I890 Lymphedema, not elsewhere classified: Secondary | ICD-10-CM | POA: Insufficient documentation

## 2015-05-17 DIAGNOSIS — C50411 Malignant neoplasm of upper-outer quadrant of right female breast: Secondary | ICD-10-CM | POA: Diagnosis not present

## 2015-05-17 DIAGNOSIS — M79601 Pain in right arm: Secondary | ICD-10-CM | POA: Insufficient documentation

## 2015-05-17 DIAGNOSIS — M7989 Other specified soft tissue disorders: Secondary | ICD-10-CM | POA: Diagnosis not present

## 2015-05-17 MED ORDER — HYDROCODONE-ACETAMINOPHEN 5-325 MG PO TABS: 1.0000 | ORAL_TABLET | Freq: Four times a day (QID) | ORAL | Status: DC | PRN

## 2015-05-17 NOTE — Telephone Encounter (Signed)
Patient called and stated,"my arm is swollen and I'm having pain. This happened overnight. It's on the side where I had my mastectomy." Instructed her to come in today to see Selena Lesser, NP, at 10:30 am. POF sent to scheduling.

## 2015-05-17 NOTE — Telephone Encounter (Signed)
Tracey [name] was informed while in radiology receiving her Doppler ultrasound of her right upper extremity that she was negative for DVT.  Attempted to call Tracey [name] to review these results with her as well.  There was no answer; and this provider left a message with the Tracey [name] stating that would proceed with right breast/axilla diagnostic ultrasound.  Advised Tracey [name] someone would be calling her to schedule this appointment.

## 2015-05-17 NOTE — Progress Notes (Signed)
SYMPTOM MANAGEMENT CLINIC   HPI: Tracey [name] 69 y.o. female diagnosed with breast cancer. Patient is status post bilateral mastectomy and reconstruction; as well as radiation therapy.  Patient is currently undergoing anastrozole oral therapy.  Patient has a history of intermittent, chronic lymphedema to her right upper extremity.  She typically does not wear her compression sleeve on a regular basis.  However, patient reports developing acute onset right axilla region pain and worsening edema to her right upper extremity within the past 24 hours.  Patient denies any known injury or trauma to the area.  Patient also denies any chest pain, chest pressure, shortness of breath, or pain with inspiration.  Patient denies any recent fevers or chills.   HPI  ROS  Past Medical History  Diagnosis Date  . Arthritis   . Gout     takes Allopurinol daily and Colchicine daily prn;last attack 33yr ago  . Tachycardia     takes Metoprolol daily  . Hyperlipidemia     takes Pravastatin daily  . Gout   . Breast cancer 2014    ER+/PR+/HEr2-,   . Colon polyps     2 polyps by report  . Rhinitis     uses Flonase prn  . Insomnia     takes Ambien nightly prn  . Anxiety     takes Ativan daily prn  . Peripheral edema     takes Furosemide daily prn  . PONV (postoperative nausea and vomiting)   . History of bronchitis     last time at least 865yrago  . Osteoarthritis   . Bruises easily   . GERD (gastroesophageal reflux disease)     occasionally takes Nexium   . Diverticulosis   . History of bladder infections     many yrs ago  . Allergy   . Radiation 07/27/13-09/07/13    Right Breast x 31 treatments  . Ventricular tachycardia, non-sustained     during sleep study 2009 with normal cardiac workup  . OSA (obstructive sleep apnea)     on CPAP  . Sinus congestion   . Postmenopausal hormone therapy   . History of stress incontinence   . Status post breast reconstruction 10/15/14    Bilateral  implant removal and DIEP performed in [place], [place]    Past Surgical History  Procedure Laterality Date  . Appendectomy    . Gallbladder surgery    . Cholecystectomy    . Cosmetic surgery    . Tonsillectomy    . Abdominal hysterectomy    . Cardiac catheterization  1999  . Colonoscopy    . Bilateral cataract surgery    . Urinary tightening      d/t urinary incontinence  . Total mastectomy Bilateral 05/23/2013    WITH RECONSTRUCTION  . Total mastectomy Bilateral 05/23/2013    Procedure: TOTAL MASTECTOMY;  Surgeon: ToOdis HollingsheadMD;  [facility];  Laterality: Bilateral;  . Axillary sentinel node biopsy Right 05/23/2013    Procedure: AXILLARY SENTINEL NODE BIOPSY;  Surgeon: ToOdis HollingsheadMD;  [facility];  Laterality: Right;  nuc med injection 7:00  . Breast reconstruction with placement of tissue expander and flex hd (acellular hydrated dermis) Bilateral 05/23/2013    Procedure: BILATERAL BREAST RECONSTRUCTION WITH PLACEMENT OF TISSUE EXPANDER AND FLEX HD;  Surgeon: DaCrissie ReeseMD;  [facility]: Pl[facility];  Laterality: Bilateral;  . Breast reconstruction Bilateral 05/17/2014    W  .  Removal of bilateral tissue expanders with placement of bilateral breast implants Bilateral 05/17/2014    Procedure: REMOVAL OF BILATERAL TISSUE EXPANDERS WITH PLACEMENT OF BILATERAL BREAST IMPLANTS FOR RECONSTRUCTION;  Surgeon: [name], MD;  Location: [facility];  Service: Plastics;  Laterality: Bilateral;    has Gout; Tachycardia; Postoperative visit; Breast cancer of upper-outer quadrant of right female breast; CAD (coronary artery disease), native coronary artery; Chest pain; Hypertension; Ventricular tachycardia, non-sustained; OSA (obstructive sleep apnea); Complex sleep apnea syndrome; Breast cancer genetic susceptibility; OSA on CPAP; and Edema on her problem list.    is allergic to ivp dye and sulfa antibiotics.    Medication List         This list is accurate as of: 05/17/15  5:30 PM.  Always use your most recent med list.               allopurinol 100 MG tablet  Commonly known as:  ZYLOPRIM  TAKE 1 TABLET BY MOUTH DAILY     anastrozole 1 MG tablet  Commonly known as:  ARIMIDEX  TAKE 1 TABLET BY MOUTH EVERY DAY     Cholecalciferol 5000 UNITS Tabs  Take 0.2 tablets (1,000 Units total) by mouth daily.     colchicine 0.6 MG tablet  TAKE 1 TABLET BY MOUTH EVERY DAY OR AS NEEDED     cyclobenzaprine 10 MG tablet  Commonly known as:  FLEXERIL  TK 1 T PO Q 8 H PRF MUSCLE SPASMS     esomeprazole 40 MG capsule  Commonly known as:  NEXIUM  TAKE 1 CAPSULE BY MOUTH EVERY DAY AT NOON     fluticasone 50 MCG/ACT nasal spray  Commonly known as:  FLONASE  Place 1 spray into the nose daily as needed for rhinitis.     furosemide 40 MG tablet  Commonly known as:  LASIX  Take 40 mg by mouth daily as needed for fluid.     HYDROcodone-acetaminophen 5-325 MG tablet  Commonly known as:  NORCO/VICODIN  Take 1-2 tablets by mouth every 6 (six) hours as needed for moderate pain.     LORazepam 0.5 MG tablet  Commonly known as:  ATIVAN  Take 0.5 mg by mouth at bedtime as needed for anxiety (or sleep).     menthol-cetylpyridinium 3 MG lozenge  Commonly known as:  CEPACOL  Take 1 lozenge (3 mg total) by mouth as needed for sore throat.     pravastatin 40 MG tablet  Commonly known as:  PRAVACHOL  TAKE 1 TABLET BY MOUTH DAILY     testosterone cypionate 200 MG/ML injection  Commonly known as:  DEPOTESTOSTERONE CYPIONATE  Inject 200 mg into the muscle every 30 (thirty) days. Normally 1st of the month     venlafaxine XR 150 MG 24 hr capsule  Commonly known as:  EFFEXOR-XR  TAKE ONE CAPSULE BY MOUTH DAILY WITH BREAKFAST     VITAMIN B-12 IJ  Inject 1,000 mcg as directed every 30 (thirty) days. Normally 1st of the month         PHYSICAL EXAMINATION  Oncology Vitals 05/17/2015 04/02/2015 02/10/2015 01/09/2015 06/04/2014  05/18/2014 05/18/2014  Height 160 cm 158 cm 158 cm 160 cm 160 cm - -  Weight 67.586 kg 67.132 kg 64.411 kg 64.547 kg 69.4 kg - -  Weight (lbs) 149 lbs 148 lbs 142 lbs 142 lbs 5 oz 153 lbs - -  BMI (kg/m2) 26.39 kg/m2 27.07 kg/m2 25.97 kg/m2 25.21 kg/m2 27.1 kg/m2 - -  Temp 98 -  97.6 97.5 98 98 98.2  Pulse 84 94 64 96 98 72 76  Resp 18 20 16 18 18 17 18   SpO2 100 - 99 100 - 98 99  BSA (m2) 1.73 m2 1.71 m2 1.68 m2 1.69 m2 1.76 m2 - -   BP Readings from Last 3 Encounters:  05/17/15 145/71  04/02/15 132/78  02/10/15 124/62    Physical Exam  Constitutional: She is oriented to person, place, and time and well-developed, well-nourished, and in no distress.  HENT:  Head: Normocephalic and atraumatic.  Eyes: Conjunctivae and EOM are normal. Pupils are equal, round, and reactive to light. Right eye exhibits no discharge. Left eye exhibits no discharge. No scleral icterus.  Neck: Normal range of motion. Neck supple.  Pulmonary/Chest: Effort normal. No respiratory distress.  Musculoskeletal: Normal range of motion. She exhibits edema. She exhibits no tenderness.  Patient with bilateral mastectomy and reconstruction.  Surgical scars well-healed; with no evidence of infection.  Patient is noted to have some mild lymphedema to the right upper extremity; with specific increased edema to the right axilla region in her right hand.  Patient was wearing a compression sleeve today.  Patient had no specific tenderness with palpation to the right axilla region.  Also, there was no palpable masses to the right axilla region.  No evidence of active infection.  Patient was observed with full range of motion.  Neurological: She is alert and oriented to person, place, and time. Gait normal.  Skin: Skin is warm and dry. No rash noted. No erythema. No pallor.  Psychiatric:  Patient appeared anxious and tearful  Nursing note and vitals reviewed.   LABORATORY DATA:. No visits with results within 3 Day(s) from  this visit. Latest known visit with results is:  Office Visit on 02/10/2015  Component Date Value Ref Range Status  . Gram Stain 02/10/2015 Rare   Final  . Gram Stain 02/10/2015 WBC present-predominately Mononuclear   Final  . Gram Stain 02/10/2015 Rare Squamous Epithelial Cells Present   Final  . Gram Stain 02/10/2015 No Organisms Seen   Final  . Organism ID, Bacteria 02/10/2015 NO GROWTH 2 DAYS   Final     RADIOGRAPHIC STUDIES: No results found.  ASSESSMENT/PLAN:    Edema Patient has a history of intermittent, chronic lymphedema to her right upper extremity.  She typically does not wear her compression sleeve on a regular basis.  However, patient reports developing acute onset right axilla region pain and worsening edema to her right upper extremity within the past 24 hours.  Patient denies any known injury or trauma to the area.  Patient also denies any chest pain, chest pressure, shortness of breath, or pain with inspiration.  On exam.-It does appear the patient has some mild, obvious lymphedema to her right upper extremity; specifically to the right axilla region and her right hand.  Patient is wearing her lymphedema team a compression sleeve today.  Patient observed with full range of motion and no tenderness to arm on exam.  There is also no erythema, warmth, or red streaks to the arm.  Doppler ultrasound of the right upper extremity obtained at this afternoon was negative for DVT.  Advice patient would order a right breast/axilla ultrasound for further evaluation.  Also, gave patient a refill of her pain medication per her request.  Advised patient to go directly to the emergency department over the weekend if she develops any worsening symptoms whatsoever.  Breast cancer of upper-outer quadrant of right female  breast Patient is status post radiation therapy in January 2015; and underwent her last breast surgery in March 2016.  She currently is undergoing anastrozole  therapy.  Patient is scheduled to return on 01/08/2016 for labs and a follow-up visit.  Patient stated understanding of all instructions; and was in agreement with this plan of care. The patient knows to call the clinic with any problems, questions or concerns.   Review/collaboration with Dr. Jana Hakim regarding all aspects of patient's visit today.   Total time spent with patient was 40 minutes;  with greater than 75 percent of that time spent in face to face counseling regarding patient's symptoms,  and coordination of care and follow up.  Disclaimer:This dictation was prepared with Dragon/digital dictation along with [employer]. Any transcriptional errors that result from this process are unintentional.  Drue Second, NP 05/17/2015

## 2015-05-17 NOTE — Assessment & Plan Note (Signed)
Patient is status post radiation therapy in January 2015; and underwent her last breast surgery in March 2016.  She currently is undergoing anastrozole therapy.  Patient is scheduled to return on 01/08/2016 for labs and a follow-up visit.

## 2015-05-17 NOTE — Assessment & Plan Note (Signed)
Patient has a history of intermittent, chronic lymphedema to her right upper extremity.  She typically does not wear her compression sleeve on a regular basis.  However, patient reports developing acute onset right axilla region pain and worsening edema to her right upper extremity within the past 24 hours.  Patient denies any known injury or trauma to the area.  Patient also denies any chest pain, chest pressure, shortness of breath, or pain with inspiration.  On exam.-It does appear the patient has some mild, obvious lymphedema to her right upper extremity; specifically to the right axilla region and her right hand.  Patient is wearing her lymphedema team a compression sleeve today.  Patient observed with full range of motion and no tenderness to arm on exam.  There is also no erythema, warmth, or red streaks to the arm.  Doppler ultrasound of the right upper extremity obtained at this afternoon was negative for DVT.  Advice patient would order a right breast/axilla ultrasound for further evaluation.  Also, gave patient a refill of her pain medication per her request.  Advised patient to go directly to the emergency department over the weekend if she develops any worsening symptoms whatsoever.

## 2015-05-17 NOTE — Progress Notes (Signed)
*  Preliminary Results* Right upper extremity venous duplex completed. Right upper extremity is negative for deep and superficial vein thrombosis.  Preliminary results discussed with Drue Second, NP.  05/17/2015 12:39 PM  Maudry Mayhew, RVT, RDCS, RDMS

## 2015-05-20 ENCOUNTER — Encounter: Payer: Self-pay | Admitting: Neurology

## 2015-05-20 ENCOUNTER — Ambulatory Visit (INDEPENDENT_AMBULATORY_CARE_PROVIDER_SITE_OTHER): Payer: Medicare Other | Admitting: Neurology

## 2015-05-20 VITALS — BP 110/60 | HR 78 | Resp 20 | Ht 62.0 in | Wt 148.0 lb

## 2015-05-20 DIAGNOSIS — G4733 Obstructive sleep apnea (adult) (pediatric): Secondary | ICD-10-CM

## 2015-05-20 DIAGNOSIS — Z9989 Dependence on other enabling machines and devices: Secondary | ICD-10-CM

## 2015-05-20 DIAGNOSIS — T466X5A Adverse effect of antihyperlipidemic and antiarteriosclerotic drugs, initial encounter: Secondary | ICD-10-CM

## 2015-05-20 DIAGNOSIS — G3184 Mild cognitive impairment, so stated: Secondary | ICD-10-CM

## 2015-05-20 DIAGNOSIS — G720 Drug-induced myopathy: Secondary | ICD-10-CM | POA: Diagnosis not present

## 2015-05-20 NOTE — Patient Instructions (Signed)

## 2015-05-20 NOTE — Progress Notes (Signed)
SLEEP MEDICINE CLINIC   Provider:  Larey Seat, M D  Referring Provider: [name], MD Primary Care Physician:  [name], MD  Chief Complaint  Patient presents with  . Follow-up    follow up sleep study results, PT REFUSED MOCA, rm 11 alone    HPI:  Tracey [name] is a 69 y.o. female , seen here as a referral from Dr. Ardeth Perfect for a transfer of sleep care.  Chief complaint according to patient : " re evaluate my apnea and care."  The patient reports that she moved to the [place] area about 3 years ago and still gets supplies for her established CPAP per Mail. She was diagnosed with obstructive sleep apnea in [place], in [place]. The [facility] posted the sleep center. The patient was diagnosed on 05/02/2008 the study revealed an AHI of 23 was mild decreases in oxygen level as well as continuous snoring. Then CPAP was used for the second night of the split night polysomnography and her oxygen level improved to normal her snoring resolved and the breathing was totally regulated according to Dr. Shelton Silvas note. Concerning was that there was a ventricular tachycardia as of 27 beats. The patient was evaluated by cardiology afterwards the finding could never be reciprocated. She continued to use CPAP for the last 7 years compliantly but she is a little tired of using a nasal mask that has become uncomfortable and his pressure marks on her face. She is also not sure to what degree apnea still present at this time. The patient stays about 7 months of the year here in [place], and 5 months in [place]. Usually they travel from Thanksgiving to Easter. Her husband witnesses snoring when she naps, but not while on CPAP. Her insomnia improved drastically after CPAP.   Sleep habits are as follows: The patient goes to bed between 9.30 and 10 PM, usually falls asleep promptly. She prefers the left side to sleep , but wakes sometimes up on her back.  She learned to  sleep on her back after breast cancer surgery ( diagnosed Aug 8th 2014 ). The bedroom is described as core, quiet and dark. The patient shares a bedroom with her husband. They're also 2 dogs in the bedroom on the bed. She rises usually once to go to the bathroom sometimes twice. She can fall asleep again fairly promptly. She is not a restless sleeper not kicking or moving or not excessively at night. She rises in the morning at 5 sometimes even 8. She usually wakes spontaneously not based on an alarm setting. She averages 7-8 hours of nocturnal sleep.  She reports vivid dreams, "repetitive and in full color".  Some mornings she feels refreshed and restored but not all the time. She is still recovering from her breast reconstruction surgery. She endorsed some snoring, joint pain, aching muscles and even some dizziness. She also has right shoulder bursitis which limits her ability to sleep on the right. She naps 3 out of 7 days, once  daily, a nap will last  2 hours unless she sets an alarm.   Tracey [name] this past medical history includes gout, hyperlipidemia, hypertension, palpitations probably due to nonsustained ventricular tachycardia. In addition breast cancer of which her mother maternal aunt maternal grandmother and maternal great-grandmother were also affected. She is a history of allergic rhinitis her sister died in 11-08-2011 of ALS.  Sleep medical history and family sleep history:  Her father was diagnosed with OSA never used CPAP ,  had an MI at age 70.  Social history: non smoker, quit at age 32. ETOH , 1-2 at night. Caffeine use : none. Decaffeinated tea and coffee.  Average and regular golf player. She likes winter sports, especially skiing but uses the left dangerous slope these days.  Interval history from 05-20-15. Hearing has been willing to undergo a new sleep study and attended nocturnal polysomnography on 04-09-15. She was diagnosed with a very mild AHI of 5.2 but in supine sleep her AHI  became 12.4 and it was further evident that the patient did not get into  REM sleep. For this reason I feel it is much more important that the patient stays on CPAP given that her apnea was mild and she was fitted with a new mask. The new mass does not cause pressure marks around her nose and does not entangle her hair. She is using a dream wear interface, which [employer] had to explicitly order for her.  Review of Systems: Out of a complete 14 system review, the patient complains of only the following symptoms, and all other reviewed systems are negative. Snoring.   Epworth score  6 from 9 , Fatigue severity score 32 from 41  , geriatric depression score 3 ,    Social History   Social History  . Marital Status: Married    Spouse Name: N/A  . Number of Children: 2  . Years of Education: N/A   Occupational History  . Not on file.   Social History Main Topics  . Smoking status: Former Smoker -- 1.00 packs/day for 20 years    Start date: 06/09/1992  . Smokeless tobacco: Never Used     Comment: quit at age 89  . Alcohol Use: 3.0 oz/week    5 Glasses of wine per week     Comment: nightly  . Drug Use: No     Comment: quit 24 years ago  . Sexual Activity: Yes    Birth Control/ Protection: Surgical   Other Topics Concern  . Not on file   Social History Narrative    Family History  Problem Relation Age of Onset  . Breast cancer Mother     possible inflammatory breast cancer  . Heart attack Father   . Heart disease Father   . ALS Sister   . Breast cancer Maternal Grandmother 55  . Heart attack Maternal Grandfather   . Heart attack Paternal Grandfather   . Breast cancer Maternal Aunt     dx in her 110s  . Breast cancer Other     maternal great grandmother; dx in her 40s    Past Medical History  Diagnosis Date  . Arthritis   . Gout     takes Allopurinol daily and Colchicine daily prn;last attack 42yr ago  . Tachycardia     takes Metoprolol daily  . Hyperlipidemia      takes Pravastatin daily  . Gout   . Breast cancer ([place] 2014    ER+/PR+/HEr2-,   . Colon polyps     2 polyps by report  . Rhinitis     uses Flonase prn  . Insomnia     takes Ambien nightly prn  . Anxiety     takes Ativan daily prn  . Peripheral edema     takes Furosemide daily prn  . PONV (postoperative nausea and vomiting)   . History of bronchitis     last time at least 82yrago  . Osteoarthritis   . Bruises  easily   . GERD (gastroesophageal reflux disease)     occasionally takes Nexium   . Diverticulosis   . History of bladder infections     many yrs ago  . Allergy   . Radiation 07/27/13-09/07/13    Right Breast x 31 treatments  . Ventricular tachycardia, non-sustained ([place])     during sleep study 2009 with normal cardiac workup  . OSA (obstructive sleep apnea)     on CPAP  . Sinus congestion   . Postmenopausal hormone therapy   . History of stress incontinence   . Status post breast reconstruction 10/15/14    Bilateral implant removal and DIEP performed in [place], [place]    Past Surgical History  Procedure Laterality Date  . Appendectomy    . Gallbladder surgery    . Cholecystectomy    . Cosmetic surgery    . Tonsillectomy    . Abdominal hysterectomy    . Cardiac catheterization  1999  . Colonoscopy    . Bilateral cataract surgery    . Urinary tightening      d/t urinary incontinence  . Total mastectomy Bilateral 05/23/2013    WITH RECONSTRUCTION  . Total mastectomy Bilateral 05/23/2013    Procedure: TOTAL MASTECTOMY;  Surgeon: [name], MD;  Location: [facility];  Service: General;  Laterality: Bilateral;  . Axillary sentinel node biopsy Right 05/23/2013    Procedure: AXILLARY SENTINEL NODE BIOPSY;  Surgeon: [name], MD;  Location: [facility];  Service: General;  Laterality: Right;  nuc med injection 7:00  . Breast reconstruction with placement of tissue expander and flex hd (acellular hydrated dermis) Bilateral 05/23/2013    Procedure: BILATERAL BREAST  RECONSTRUCTION WITH PLACEMENT OF TISSUE EXPANDER AND FLEX HD;  Surgeon: [name], MD;  Location: [facility];  Service: Plastics;  Laterality: Bilateral;  . Breast reconstruction Bilateral 05/17/2014    W  . Removal of bilateral tissue expanders with placement of bilateral breast implants Bilateral 05/17/2014    Procedure: REMOVAL OF BILATERAL TISSUE EXPANDERS WITH PLACEMENT OF BILATERAL BREAST IMPLANTS FOR RECONSTRUCTION;  Surgeon: [name], MD;  Location: [facility];  Service: Plastics;  Laterality: Bilateral;    Current Outpatient Prescriptions  Medication Sig Dispense Refill  . allopurinol (ZYLOPRIM) 100 MG tablet TAKE 1 TABLET BY MOUTH DAILY 90 tablet 0  . anastrozole (ARIMIDEX) 1 MG tablet TAKE 1 TABLET BY MOUTH EVERY DAY 90 tablet 3  . cholecalciferol 5000 UNITS TABS Take 0.2 tablets (1,000 Units total) by mouth daily. 100 tablet 3  . colchicine 0.6 MG tablet TAKE 1 TABLET BY MOUTH EVERY DAY OR AS NEEDED 30 tablet 0  . Cyanocobalamin (VITAMIN B-12 IJ) Inject 1,000 mcg as directed every 30 (thirty) days. Normally 1st of the month    . cyclobenzaprine (FLEXERIL) 10 MG tablet TK 1 T PO Q 8 H PRF MUSCLE SPASMS  0  . esomeprazole (NEXIUM) 40 MG capsule TAKE 1 CAPSULE BY MOUTH EVERY DAY AT NOON 30 capsule 0  . fluticasone (FLONASE) 50 MCG/ACT nasal spray Place 1 spray into the nose daily as needed for rhinitis.     . furosemide (LASIX) 40 MG tablet Take 40 mg by mouth daily as needed for fluid.     Marland Kitchen HYDROcodone-acetaminophen (NORCO/VICODIN) 5-325 MG tablet Take 1-2 tablets by mouth every 6 (six) hours as needed for moderate pain. 30 tablet 0  . LORazepam (ATIVAN) 0.5 MG tablet Take 0.5 mg by mouth at bedtime as needed for anxiety (or sleep).    Marland Kitchen  menthol-cetylpyridinium (CEPACOL) 3 MG lozenge Take 1 lozenge (3 mg total) by mouth as needed for sore throat. 100 tablet 12  . pravastatin (PRAVACHOL) 40 MG tablet TAKE 1 TABLET BY MOUTH DAILY 90 tablet 0  . testosterone cypionate (DEPOTESTOTERONE  CYPIONATE) 200 MG/ML injection Inject 200 mg into the muscle every 30 (thirty) days. Normally 1st of the month    . venlafaxine XR (EFFEXOR-XR) 150 MG 24 hr capsule TAKE ONE CAPSULE BY MOUTH DAILY WITH BREAKFAST 90 capsule 3   No current facility-administered medications for this visit.    Allergies as of 05/20/2015 - Review Complete 05/20/2015  Allergen Reaction Noted  . Ivp dye [iodinated diagnostic agents] Hives 06/09/2012  . Sulfa antibiotics Swelling 06/09/2012    Vitals: BP 110/60 mmHg  Pulse 78  Resp 20  Ht 5' 2"  (1.575 m)  Wt 148 lb (67.132 kg)  BMI 27.06 kg/m2 Last Weight:  Wt Readings from Last 1 Encounters:  05/20/15 148 lb (67.132 kg)   KAJ:GOTL mass index is 27.06 kg/(m^2).     Last Height:   Ht Readings from Last 1 Encounters:  05/20/15 5' 2"  (1.575 m)    Physical exam:  General: The patient is awake, alert and appears not in acute distress. The patient is well groomed. Head: Normocephalic, atraumatic. Neck is supple. Mallampati 4,  neck circumference: 16.25   Nasal airflow unrestricted , TMJ is not  evident. Retrognathia is not seen. She has a crowded lower jar. Cardiovascular:  Regular rate and rhythm , without  murmurs or carotid bruit, and without distended neck veins. Respiratory: Lungs are clear to auscultation. Skin:  Without evidence of edema, or rash Trunk: BMI is 27. The patient's posture is erect.   Neurologic exam : The patient is awake and alert, oriented to place and time.   Memory subjective described as intact.     Attention span & concentration ability appears normal.  Speech is fluent,  without  dysarthria, dysphonia or aphasia.  Mood and affect are appropriate.  Cranial nerves: Pupils are equal and briskly reactive to light. Funduscopic exam without  evidence of pallor or edema. Extraocular movements  in vertical and horizontal planes intact and without nystagmus. Visual fields by finger perimetry are intact. Hearing to finger rub  intact.   Facial sensation intact to fine touch.  Facial motor strength is symmetric and tongue and uvula move midline. Shoulder shrug was symmetrical. She is very tense over the shoulder area.    The patient was advised of the nature of the diagnosed sleep disorder , the treatment options and risks for general a health and wellness arising from not treating the condition.  I spent more than 15 minutes of face to face time with the patient. Greater than 50% of time was spent in counseling and coordination of care. We have discussed the diagnosis and differential and I answered the patient's questions.     Assessment:  After physical and neurologic examination, review of laboratory studies,  Personal review of imaging studies, reports of other /same  Imaging studies, results of polysomnography/ neurophysiology testing and pre-existing records as far as provided in visit,  My Assessment is that of a patient with oxygen need when travelling to her winter home in [place]. She was diagnosed with OSA in [place] , in 2009. She snores when she naps and doesn't use CPAP.  I was able to obtain a compliance report during her office visit. This is dated from 04-01-15, and shows that the patient has used  the machine 87% of all nights with over 4 hours of continued use. And 90% of all nights.  She has a CPAP and should now with a new interface  Continue using it.  EPR setting of 1 cm with a baseline setting of 8 cm water a ramp time of 30 minutes and a ramp start pressure of only 4 cm water . 1)  continues to have mild OSA. weight is not a factor - 148 pounds.  2) MCI,  per subjective report.  Patient refuses MOCA/    Plan:  Treatment plan and additional workup :  Follow up with me in 6 moth with CPAP download and we will discuss her memory complaint at that time by a MOCA ,  30 minutes.     Asencion Partridge Latonia Conrow MD  05/20/2015   CC: [name], [address], [place]  [id number]

## 2015-05-22 ENCOUNTER — Other Ambulatory Visit: Payer: Self-pay | Admitting: Nurse Practitioner

## 2015-05-22 ENCOUNTER — Other Ambulatory Visit: Payer: Self-pay

## 2015-05-22 ENCOUNTER — Ambulatory Visit
Admission: RE | Admit: 2015-05-22 | Discharge: 2015-05-22 | Disposition: A | Discharge status: Home / Self Care | Payer: Medicare Other | Source: Ambulatory Visit | Attending: Nurse Practitioner | Admitting: Nurse Practitioner

## 2015-05-22 ENCOUNTER — Telehealth: Payer: Self-pay | Admitting: *Deleted

## 2015-05-22 DIAGNOSIS — C50411 Malignant neoplasm of upper-outer quadrant of right female breast: Secondary | ICD-10-CM

## 2015-05-22 DIAGNOSIS — N644 Mastodynia: Secondary | ICD-10-CM

## 2015-05-22 DIAGNOSIS — R600 Localized edema: Secondary | ICD-10-CM

## 2015-05-22 DIAGNOSIS — R928 Other abnormal and inconclusive findings on diagnostic imaging of breast: Secondary | ICD-10-CM | POA: Diagnosis not present

## 2015-05-22 DIAGNOSIS — I890 Lymphedema, not elsewhere classified: Secondary | ICD-10-CM

## 2015-05-22 DIAGNOSIS — M79621 Pain in right upper arm: Secondary | ICD-10-CM | POA: Diagnosis not present

## 2015-05-22 DIAGNOSIS — N6489 Other specified disorders of breast: Secondary | ICD-10-CM | POA: Diagnosis not present

## 2015-05-22 DIAGNOSIS — Z853 Personal history of malignant neoplasm of breast: Secondary | ICD-10-CM | POA: Diagnosis not present

## 2015-05-22 NOTE — Telephone Encounter (Addendum)
Writer spoke with [facility] regarding patient's mammo and US of the breast scheduled for Monday.  Patient does not need to have previous 2014 mammo images just the report from [facility] for the tests on Monday however she will need to get them sent here at some point for comparison. LVM for patient to call back.  Writer will speak with her about obtaining the 2014 scan. Spoke with patient finally.  She will call [facility] and have them fax the 2014 report  to [facility] directly to [employer] this morning.  Patient will also request a disc to be sent of the mammo images. Patient to have mammo and [place] today at 1:30.

## 2015-05-22 NOTE — Telephone Encounter (Signed)
FYI Forwarded the following voicemail: "I had an appointment Friday with [facility] and was to receive a call Friday or Monday.  No call received.  I'm calling today to schedule a deep tissue sonogram.  I am in pain.  Please call [phone]."

## 2015-05-22 NOTE — Telephone Encounter (Signed)
Called [name] to schedule US Breast. Advised pt needs to have a Diagnostic mammo before [place] can be completed. Appt would be cancelled if the previous scans were not received. Diagnostic Mammo ordered. Advised Dr. Virgie Dad colab nurse of situation. Pt is tentatively scheduled for mammo, [place] on Monday 10/10 at 0730.

## 2015-05-27 ENCOUNTER — Other Ambulatory Visit: Payer: Self-pay

## 2015-05-29 ENCOUNTER — Ambulatory Visit: Payer: Medicare Other | Attending: Nurse Practitioner | Admitting: Physical Therapy

## 2015-05-29 ENCOUNTER — Ambulatory Visit: Payer: Medicare Other | Admitting: Physical Therapy

## 2015-05-29 DIAGNOSIS — I890 Lymphedema, not elsewhere classified: Secondary | ICD-10-CM | POA: Diagnosis not present

## 2015-05-29 DIAGNOSIS — R222 Localized swelling, mass and lump, trunk: Secondary | ICD-10-CM | POA: Diagnosis not present

## 2015-05-29 NOTE — Patient Instructions (Signed)

## 2015-05-29 NOTE — Therapy (Signed)
[place], [place], [address] Phone: [phone]   Fax:  [phone]  Physical Therapy Evaluation  Patient Details  Name: Tracey [name] MRN: [id number] Date of Birth: 1946/06/04 Referring Provider:  Laurie Panda, NP  Encounter Date: 05/29/2015    Past Medical History  Diagnosis Date  . Arthritis   . Gout     takes Allopurinol daily and Colchicine daily prn;last attack 57yr ago  . Tachycardia     takes Metoprolol daily  . Hyperlipidemia     takes Pravastatin daily  . Gout   . Breast cancer ([facility] 2014    ER+/PR+/HEr2-,   . Colon polyps     2 polyps by report  . Rhinitis     uses Flonase prn  . Insomnia     takes Ambien nightly prn  . Anxiety     takes Ativan daily prn  . Peripheral edema     takes Furosemide daily prn  . PONV (postoperative nausea and vomiting)   . History of bronchitis     last time at least 875yrago  . Osteoarthritis   . Bruises easily   . GERD (gastroesophageal reflux disease)     occasionally takes Nexium   . Diverticulosis   . History of bladder infections     many yrs ago  . Allergy   . Radiation 07/27/13-09/07/13    Right Breast x 31 treatments  . Ventricular tachycardia, non-sustained ([facility]    during sleep study 2009 with normal cardiac workup  . OSA (obstructive sleep apnea)     on CPAP  . Sinus congestion   . Postmenopausal hormone therapy   . History of stress incontinence   . Status post breast reconstruction 10/15/14    Bilateral implant removal and DIEP performed in [place]    Past Surgical History  Procedure Laterality Date  . Appendectomy    . Gallbladder surgery    . Cholecystectomy    . Cosmetic surgery    . Tonsillectomy    . Abdominal hysterectomy    . Cardiac catheterization  1999  . Colonoscopy    . Bilateral cataract surgery    . Urinary tightening      d/t urinary incontinence  . Total mastectomy Bilateral 05/23/2013     WITH RECONSTRUCTION  . Total mastectomy Bilateral 05/23/2013    Procedure: TOTAL MASTECTOMY;  Surgeon: ToOdis HollingsheadMD;  Location: [facility] Service: General;  Laterality: Bilateral;  . Axillary sentinel node biopsy Right 05/23/2013    Procedure: AXILLARY SENTINEL NODE BIOPSY;  Surgeon: ToOdis HollingsheadMD;  Location: [facility] Service: General;  Laterality: Right;  nuc med injection 7:00  . Breast reconstruction with placement of tissue expander and flex hd (acellular hydrated dermis) Bilateral 05/23/2013    Procedure: BILATERAL BREAST RECONSTRUCTION WITH PLACEMENT OF TISSUE EXPANDER AND FLEX HD;  Surgeon: DaCrissie ReeseMD;  [facility]: [facility] Service: Plastics;  Laterality: Bilateral;  . Breast reconstruction Bilateral 05/17/2014    W  . Removal of bilateral tissue expanders with placement of bilateral breast implants Bilateral 05/17/2014    Procedure: REMOVAL OF BILATERAL TISSUE EXPANDERS WITH PLACEMENT OF BILATERAL BREAST IMPLANTS FOR RECONSTRUCTION;  Surgeon: DaCrissie ReeseMD;  Location: [facility] Service: Plastics;  Laterality: Bilateral;    There were no vitals filed for this visit.  Visit Diagnosis:  Swelling in chest - Plan: PT plan of care cert/re-cert  Lymphedema of upper extremity -  Plan: PT plan of care cert/re-cert      Subjective Assessment - 05/29/15 1617    Subjective Started with right axillary discomfort a couple weeks ago, then hand puffed way up.     Pertinent History Had Doppler for right arm, which was negative.  Right breast cancer with double mastectomy 05/23/13 with 5 lymph nodes removed, then radiation (no chemo).  Later had reconstruction in October 2015 and in January it started to come out (the implant); and saw a doctor in [place] and had TRAM flap reconstruction.  h/o right shoulder bursitis in the last year or so.                                                              Patient Stated Goals make sure I know how to self-drain with written instructions    Currently in Pain? Yes   Pain Score 5    Pain Location Leg   Pain Orientation Right;Left   Pain Descriptors / Indicators Aching;Other (Comment)  osteoarthritis   Aggravating Factors  opening a bottle or can   Pain Relieving Factors celebrex            [facility] PT Assessment - 05/29/15 0001    Assessment   Medical Diagnosis lymphedema of right chest and arm   Precautions   Precautions Other (comment)   Precaution Comments cancer precautions   Restrictions   Weight Bearing Restrictions No   Balance Screen   Has the patient fallen in the past 6 months No   Has the patient had a decrease in activity level because of a fear of falling?  No   Is the patient reluctant to leave their home because of a fear of falling?  No   Home Environment   Living Environment Private residence   Living Arrangements Spouse/significant other   Type of [place] Two level   Additional Comments difficulty with stairs first thing in the morning   Prior Function   Level of Independence Independent   Vocation Retired   Associate Professor   Overall Cognitive Status Within Functional Limits for tasks assessed   Observation/Other Assessments   Observations well-looking woman of 69   Posture/Postural Control   Postural Limitations Rounded Shoulders;Forward head   AROM   Overall AROM  --  both shoulders slightly limited but functional, right tight           LYMPHEDEMA/ONCOLOGY QUESTIONNAIRE - 05/29/15 2253    Type   Cancer Type Right breast   Surgeries   Mastectomy Date 05/23/13   Tram Date 10/15/14   Saline Implant Reconstruction Date 05/23/14   Sentinel Lymph Node Biopsy Date 05/23/13   Number Lymph Nodes Removed 5   Treatment   Past Radiation Treatment Yes  completed on this date   Date 09/08/13   Body Site right breast   Current Hormone Treatment Yes   Right Upper Extremity Lymphedema   10 cm Proximal to Olecranon Process 26.8 cm   Olecranon Process 24 cm   10 cm Proximal to  Ulnar Styloid Process 22 cm   Just Proximal to Ulnar Styloid Process 14.7 cm   Across Hand at [employer] 18.8 cm   At [place] of 2nd Digit 6.5 cm   Left Upper Extremity  Lymphedema   10 cm Proximal to Olecranon Process 27.3 cm   Olecranon Process 23.8 cm   10 cm Proximal to Ulnar Styloid Process 21.4 cm   Just Proximal to Ulnar Styloid Process 14.1 cm   Across Hand at [employer] 18.1 cm   At [place] of 2nd Digit 5.9 cm           Quick Dash - 05/29/15 0001    Open a tight or new jar Severe difficulty   Do heavy household chores (wash walls, wash floors) Moderate difficulty   Carry a shopping bag or briefcase Moderate difficulty   Wash your back Moderate difficulty   Use a knife to cut food Mild difficulty   Recreational activities in which you take some force or impact through your arm, shoulder, or hand (golf, hammering, tennis) Mild difficulty   During the past week, to what extent has your arm, shoulder or hand problem interfered with your normal social activities with family, friends, neighbors, or groups? Slightly   During the past week, to what extent has your arm, shoulder or hand problem limited your work or other regular daily activities Modererately   Arm, shoulder, or hand pain. Moderate   Tingling (pins and needles) in your arm, shoulder, or hand Moderate   Difficulty Sleeping Moderate difficulty   DASH Score 45.45 %             [facility] Adult PT Treatment/Exercise - 05/29/15 0001    Manual Therapy   Manual Lymphatic Drainage (MLD) Instructed patient in the following as it was performed, patient in supine:  diaphragmatic breathing, short neck, left axilary nodes and anterior interaxillary anastomosis, right groin and axillo-inguinal anastomosis, and right UE from fingers to hand.                   Short Term Clinic Goals - 01/15/15 1751    CC Short Term Goal  #1   Title Patient will be able to demonstrate initial home exercise program safely     Status On-going   CC Short Term Goal  #2   Title Patient will be able to increase right shoulder active flexion to >/= 145 degrees for increased ease with reaching   Status On-going   CC Short Term Goal  #3   Title Patient will be able to increase right shoulder active internal rotation to >/= 50 degrees for increased ease scratching her back   Status On-going   CC Short Term Goal  #4   Title Patient will be able to report >/= 25% improvement in right chest swelling for increased comfort and improved cosmesis   Status On-going             Long Term Clinic Goals - 05/29/15 2304    CC Long Term Goal  #1   Title Patient will be independent in performing self-manual lymph drainage, or patient's husband will be.   Time 2   Period Weeks   Status New   CC Long Term Goal  #2   Title Patient will know where and how to obtain a compression garment for nighttime use, if desired.   Time 4   Period Weeks   Status New            Plan - 05/29/15 2252    Clinical Impression Statement Patient known to this clinic returns now with c/o right axilla/chest/arm swelling.  Measurements show increases in circumferences on both sides compared to May.  Right forearm  is slightly bigger than left today; upper arm is smaller on right.   Pt will benefit from skilled therapeutic intervention in order to improve on the following deficits Increased edema;Decreased knowledge of use of DME   Rehab Potential Good   PT Frequency --  1-2 additional visits   PT Duration 4 weeks   PT Treatment/Interventions Manual lymph drainage;Patient/family education   PT Next Visit Plan Review self-manual lymph drainage further and/or teach patient's husband.  Discuss nighttime compression garments further.   Consulted and Agree with Plan of Care Patient          G-Codes - 06-27-2015 10-21-04    Functional Assessment Tool Used quick DASH   Functional Limitation Carrying, moving and handling objects   Carrying, Moving and  Handling Objects Current Status [phone]) At least 40 percent but less than 60 percent impaired, limited or restricted   Carrying, Moving and Handling Objects Goal Status ([id number]) At least 1 percent but less than 20 percent impaired, limited or restricted       Problem List Patient Active Problem List   Diagnosis Date Noted  . Statin myopathy 05/20/2015  . MCI (mild cognitive impairment) 05/20/2015  . Edema 05/17/2015  . 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 Breast cancer genetic susceptibility 04/02/2015  . OSA on CPAP 04/02/2015  . CAD (coronary artery disease), native coronary artery 01/01/2014  . Chest pain 01/01/2014  . Hypertension   . Ventricular tachycardia, non-sustained ([place])   . OSA (obstructive sleep apnea)   . Breast cancer of upper-outer quadrant of right female breast ([place]) 06/28/2013  . Postoperative visit 06/02/2013  . Gout   . Tachycardia     Rahul Malinak 2015/06/27, 11:12 PM  [place], [place], [address] Phone: [phone]   Fax:  [place], PT 2015/06/27 11:12 PM

## 2015-06-17 ENCOUNTER — Ambulatory Visit: Payer: Medicare Other | Admitting: Physical Therapy

## 2015-06-17 DIAGNOSIS — R222 Localized swelling, mass and lump, trunk: Secondary | ICD-10-CM | POA: Diagnosis not present

## 2015-06-17 DIAGNOSIS — I890 Lymphedema, not elsewhere classified: Secondary | ICD-10-CM

## 2015-06-17 NOTE — Therapy (Signed)
[place], [place], [address] Phone: [phone]   Fax:  [phone]  Physical Therapy Treatment  Patient Details  Name: Tracey [name] MRN: [id number] Date of Birth: 01/01/1946 No Data Recorded  Encounter Date: 06/17/2015      PT End of Session - 06/17/15 1736    Visit Number 2   Number of Visits 2   PT Start Time 1350   PT Stop Time 1440   PT Time Calculation (min) 50 min   Activity Tolerance Patient tolerated treatment well   Behavior During Therapy [facility] for tasks assessed/performed      Past Medical History  Diagnosis Date  . Arthritis   . Gout     takes Allopurinol daily and Colchicine daily prn;last attack 8yr ago  . Tachycardia     takes Metoprolol daily  . Hyperlipidemia     takes Pravastatin daily  . Gout   . Breast cancer ([facility] 2014    ER+/PR+/HEr2-,   . Colon polyps     2 polyps by report  . Rhinitis     uses Flonase prn  . Insomnia     takes Ambien nightly prn  . Anxiety     takes Ativan daily prn  . Peripheral edema     takes Furosemide daily prn  . PONV (postoperative nausea and vomiting)   . History of bronchitis     last time at least 868yrago  . Osteoarthritis   . Bruises easily   . GERD (gastroesophageal reflux disease)     occasionally takes Nexium   . Diverticulosis   . History of bladder infections     many yrs ago  . Allergy   . Radiation 07/27/13-09/07/13    Right Breast x 31 treatments  . Ventricular tachycardia, non-sustained ([facility]    during sleep study 2009 with normal cardiac workup  . OSA (obstructive sleep apnea)     on CPAP  . Sinus congestion   . Postmenopausal hormone therapy   . History of stress incontinence   . Status post breast reconstruction 10/15/14    Bilateral implant removal and DIEP performed in [place]    Past Surgical History  Procedure Laterality Date  . Appendectomy    . Gallbladder surgery    . Cholecystectomy     . Cosmetic surgery    . Tonsillectomy    . Abdominal hysterectomy    . Cardiac catheterization  1999  . Colonoscopy    . Bilateral cataract surgery    . Urinary tightening      d/t urinary incontinence  . Total mastectomy Bilateral 05/23/2013    WITH RECONSTRUCTION  . Total mastectomy Bilateral 05/23/2013    Procedure: TOTAL MASTECTOMY;  Surgeon: ToOdis HollingsheadMD;  Location: [facility] Service: General;  Laterality: Bilateral;  . Axillary sentinel node biopsy Right 05/23/2013    Procedure: AXILLARY SENTINEL NODE BIOPSY;  Surgeon: ToOdis HollingsheadMD;  Location: [facility] Service: General;  Laterality: Right;  nuc med injection 7:00  . Breast reconstruction with placement of tissue expander and flex hd (acellular hydrated dermis) Bilateral 05/23/2013    Procedure: BILATERAL BREAST RECONSTRUCTION WITH PLACEMENT OF TISSUE EXPANDER AND FLEX HD;  Surgeon: DaCrissie ReeseMD;  Location: [facility] Service: Plastics;  Laterality: Bilateral;  . Breast reconstruction Bilateral 05/17/2014    W  . Removal of bilateral tissue expanders with placement of bilateral breast implants Bilateral 05/17/2014  Procedure: REMOVAL OF BILATERAL TISSUE EXPANDERS WITH PLACEMENT OF BILATERAL BREAST IMPLANTS FOR RECONSTRUCTION;  Surgeon: [name], MD;  Location: [place];  Service: Plastics;  Laterality: Bilateral;    There were no vitals filed for this visit.  Visit Diagnosis:  Swelling in chest  Lymphedema of upper extremity      Subjective Assessment - 2015/07/17 1352    Subjective Nothing new.                         [place] Adult PT Treatment/Exercise - 17-Jul-2015 0001    Self-Care   Self-Care Other Self-Care Comments   Other Self-Care Comments  showed patient nighttime compression garment samples that we have, discussing options.  Wrote down brand names and gave a website for her to look at to investigate online.  Gave patient info about obtaining a new compression sleeve.  She has class I  currently so she can get this without a prescription; she has a Medi, which fits okay, though it is a little loose right at the wrist.  Suggested that vendor needs to remeasure her, and to consider a [employer], for medium containment.   Manual Therapy   Manual Lymphatic Drainage (MLD) patient in supine:  diaphragmatic breathing, short neck, left axilary nodes and anterior interaxillary anastomosis, right groin and axillo-inguinal anastomosis, and right UE from fingers to hand.                        [place] [facility] Goals - 07-17-2015 1738    CC Long Term Goal  #1   Title Patient will be independent in performing self-manual lymph drainage, or patient's husband will be.   Status Achieved   CC Long Term Goal  #2   Title Patient will know where and how to obtain a compression garment for nighttime use, if desired.   Status Achieved            Plan - Jul 17, 2015 1736    Clinical Impression Statement Patient is doing well and today got the additional information she came in for and is ready for discharge.  She was given info about nighttime compression sleeves; she may feel a need to call back for more input from [place] if she does not feel able to make a decision about this independently.   Pt will benefit from skilled therapeutic intervention in order to improve on the following deficits Increased edema;Decreased knowledge of use of DME   Rehab Potential Good   PT Treatment/Interventions Manual lymph drainage;Patient/family education;ADLs/Self Care Home Management   PT Next Visit Plan None; discharge today.   Recommended Other Services vendor for daytime and nighttime compression garments fittings; patient may go to [facility] but was also given a business card for [facility], particularly for the nighttime sleeve.   Consulted and Agree with Plan of Care Patient          G-Codes - 2015/07/17 1739    Functional Assessment Tool Used clnical judgement   Functional  Limitation Carrying, moving and handling objects   Carrying, Moving and Handling Objects Goal Status ([id number]) At least 1 percent but less than 20 percent impaired, limited or restricted   Carrying, Moving and Handling Objects Discharge Status [phone]) At least 20 percent but less than 40 percent impaired, limited or restricted      Problem List Patient Active Problem List   Diagnosis Date Noted  . Statin myopathy 05/20/2015  .  MCI (mild cognitive impairment) 05/20/2015  . Edema 05/17/2015  . 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 Breast cancer genetic susceptibility 04/02/2015  . OSA on CPAP 04/02/2015  . CAD (coronary artery disease), native coronary artery 01/01/2014  . Chest pain 01/01/2014  . Hypertension   . Ventricular tachycardia, non-sustained ([place])   . OSA (obstructive sleep apnea)   . Breast cancer of upper-outer quadrant of right female breast ([place]) 06/28/2013  . Postoperative visit 06/02/2013  . Gout   . Tachycardia     Xander Jutras 06/17/2015, 5:41 PM  [place], [place], [address] Phone: [phone]   Fax:  [phone]  Name: Tracey [name] MRN: [id number] Date of Birth: 1945-09-01    PHYSICAL THERAPY DISCHARGE SUMMARY  Visits from Start of Care: 2  Current functional level related to goals / functional outcomes: See above.   Remaining deficits: Swelling will continue to require management. Patient has info about how to obtain a new daytime and a nighttime compression sleeve, but hasn't obtained these yet.   Education / Equipment: Self-manual lymph drainage review; obtaining new garments. Plan: Patient agrees to discharge.  Patient goals were met. Patient is being discharged due to meeting the stated rehab goals.  ?????  Serafina Royals, PT 06/17/2015 5:41 PM

## 2015-06-21 ENCOUNTER — Telehealth: Payer: Self-pay | Admitting: Family Medicine

## 2015-06-21 NOTE — Telephone Encounter (Signed)
Spoke with patient and her pcp is Dr. [name].

## 2015-07-12 ENCOUNTER — Other Ambulatory Visit: Payer: Self-pay | Admitting: Cardiology

## 2015-07-15 ENCOUNTER — Other Ambulatory Visit: Payer: Self-pay | Admitting: *Deleted

## 2015-07-17 DIAGNOSIS — Z124 Encounter for screening for malignant neoplasm of cervix: Secondary | ICD-10-CM | POA: Diagnosis not present

## 2015-07-17 DIAGNOSIS — Z1289 Encounter for screening for malignant neoplasm of other sites: Secondary | ICD-10-CM | POA: Diagnosis not present

## 2015-07-24 DIAGNOSIS — L820 Inflamed seborrheic keratosis: Secondary | ICD-10-CM | POA: Diagnosis not present

## 2015-07-24 DIAGNOSIS — L821 Other seborrheic keratosis: Secondary | ICD-10-CM | POA: Diagnosis not present

## 2015-07-24 DIAGNOSIS — D225 Melanocytic nevi of trunk: Secondary | ICD-10-CM | POA: Diagnosis not present

## 2015-07-24 DIAGNOSIS — L578 Other skin changes due to chronic exposure to nonionizing radiation: Secondary | ICD-10-CM | POA: Diagnosis not present

## 2015-08-02 ENCOUNTER — Inpatient Hospital Stay: Admission: RE | Admit: 2015-08-02 | Payer: Self-pay | Source: Ambulatory Visit

## 2015-08-02 ENCOUNTER — Other Ambulatory Visit: Payer: Self-pay | Admitting: Cardiology

## 2015-08-02 ENCOUNTER — Other Ambulatory Visit: Payer: Self-pay | Admitting: Oncology

## 2015-08-02 DIAGNOSIS — Z23 Encounter for immunization: Secondary | ICD-10-CM | POA: Diagnosis not present

## 2015-08-22 DIAGNOSIS — J209 Acute bronchitis, unspecified: Secondary | ICD-10-CM | POA: Diagnosis not present

## 2015-08-22 DIAGNOSIS — N951 Menopausal and female climacteric states: Secondary | ICD-10-CM | POA: Diagnosis not present

## 2015-08-22 DIAGNOSIS — R05 Cough: Secondary | ICD-10-CM | POA: Diagnosis not present

## 2015-09-03 DIAGNOSIS — Z9109 Other allergy status, other than to drugs and biological substances: Secondary | ICD-10-CM | POA: Diagnosis not present

## 2015-09-03 DIAGNOSIS — J45909 Unspecified asthma, uncomplicated: Secondary | ICD-10-CM | POA: Diagnosis not present

## 2015-09-03 DIAGNOSIS — R05 Cough: Secondary | ICD-10-CM | POA: Diagnosis not present

## 2015-09-12 ENCOUNTER — Other Ambulatory Visit: Payer: Self-pay | Admitting: Oncology

## 2015-09-13 NOTE — Telephone Encounter (Signed)
Chart reviewed.

## 2015-10-15 DIAGNOSIS — Z9013 Acquired absence of bilateral breasts and nipples: Secondary | ICD-10-CM | POA: Diagnosis not present

## 2015-10-15 DIAGNOSIS — Z853 Personal history of malignant neoplasm of breast: Secondary | ICD-10-CM | POA: Diagnosis not present

## 2015-10-18 ENCOUNTER — Other Ambulatory Visit: Payer: Self-pay | Admitting: Oncology

## 2015-10-18 DIAGNOSIS — Z9013 Acquired absence of bilateral breasts and nipples: Secondary | ICD-10-CM | POA: Diagnosis not present

## 2015-10-18 DIAGNOSIS — Z853 Personal history of malignant neoplasm of breast: Secondary | ICD-10-CM | POA: Diagnosis not present

## 2015-10-18 DIAGNOSIS — L818 Other specified disorders of pigmentation: Secondary | ICD-10-CM | POA: Diagnosis not present

## 2015-10-22 ENCOUNTER — Other Ambulatory Visit: Payer: Self-pay | Admitting: *Deleted

## 2015-10-22 MED ORDER — COLCHICINE 0.6 MG PO TABS: ORAL_TABLET | ORAL | Status: DC

## 2015-10-30 ENCOUNTER — Telehealth: Payer: Self-pay | Admitting: *Deleted

## 2015-10-30 NOTE — Telephone Encounter (Signed)
Pt left VM stating " I am having periods of crying and am wondering if we could increase my dose of venlafaxine ?"  Return call number given as [phone].  This RN returned call to above and obtained identified VM - message left to return call to this RN.  Note per med list pt is on venlafaxine 150 mg daily- need to discuss moving appointment to earlier date to discuss.

## 2015-10-31 ENCOUNTER — Other Ambulatory Visit: Payer: Self-pay | Admitting: *Deleted

## 2015-10-31 MED ORDER — VENLAFAXINE HCL ER 37.5 MG PO CP24: 37.5000 mg | ORAL_CAPSULE | Freq: Every day | ORAL | Status: DC

## 2015-10-31 MED ORDER — VENLAFAXINE HCL ER 37.5 MG PO CP24: ORAL_CAPSULE | ORAL | Status: DC

## 2015-10-31 NOTE — Telephone Encounter (Signed)
This RN spoke with pt per her call stating she has had increased episodes of crying.  " just kinda happens - and it is embarrassing ".  Per discussion, Tracey [name] states she is currently in her winter home in [place]. She denies any feelings of being overwhelmed nor does she have suicidal ideas.  Pt is requesting an increase in dose of venlafaxine.  " I think I just think about the loss of my mom and my sister and then I miss my daughter and the grand kids back in [place]"  Pt did begin to cry during conversation but was able to maintain discussion and focus.  This RN discussed above issues - including emotional impact. Ideally it would be best to see the patient so we could evaluate her to increase dose. Pt is willing to come in upon return to [facility] in April.  Per review with HB/NP 37.5mg  dose sent to her local [facility] in [place].  Pt aware of above and will call with any further concerns.

## 2015-11-03 DIAGNOSIS — Z9013 Acquired absence of bilateral breasts and nipples: Secondary | ICD-10-CM | POA: Diagnosis not present

## 2015-11-03 DIAGNOSIS — Z853 Personal history of malignant neoplasm of breast: Secondary | ICD-10-CM | POA: Diagnosis not present

## 2015-11-03 DIAGNOSIS — Z01812 Encounter for preprocedural laboratory examination: Secondary | ICD-10-CM | POA: Diagnosis not present

## 2015-11-07 ENCOUNTER — Other Ambulatory Visit: Payer: Self-pay | Admitting: Cardiology

## 2015-11-07 ENCOUNTER — Other Ambulatory Visit: Payer: Self-pay | Admitting: Oncology

## 2015-11-20 DIAGNOSIS — Z853 Personal history of malignant neoplasm of breast: Secondary | ICD-10-CM | POA: Diagnosis not present

## 2015-11-20 DIAGNOSIS — Z9013 Acquired absence of bilateral breasts and nipples: Secondary | ICD-10-CM | POA: Diagnosis not present

## 2015-11-21 ENCOUNTER — Telehealth: Payer: Self-pay | Admitting: Oncology

## 2015-11-21 DIAGNOSIS — Z9013 Acquired absence of bilateral breasts and nipples: Secondary | ICD-10-CM | POA: Diagnosis not present

## 2015-11-21 DIAGNOSIS — N650 Deformity of reconstructed breast: Secondary | ICD-10-CM | POA: Diagnosis not present

## 2015-11-21 DIAGNOSIS — Z853 Personal history of malignant neoplasm of breast: Secondary | ICD-10-CM | POA: Diagnosis not present

## 2015-11-21 DIAGNOSIS — Z9011 Acquired absence of right breast and nipple: Secondary | ICD-10-CM | POA: Diagnosis not present

## 2015-11-21 NOTE — Telephone Encounter (Signed)
Lvm advising appt chg on 5/31 from GM at 2.30 to HB at 2.15.

## 2015-12-11 DIAGNOSIS — Z853 Personal history of malignant neoplasm of breast: Secondary | ICD-10-CM | POA: Diagnosis not present

## 2015-12-23 ENCOUNTER — Encounter: Payer: Self-pay | Admitting: Neurology

## 2015-12-23 ENCOUNTER — Ambulatory Visit (INDEPENDENT_AMBULATORY_CARE_PROVIDER_SITE_OTHER): Payer: Medicare Other | Admitting: Neurology

## 2015-12-23 VITALS — BP 128/84 | HR 88 | Resp 20 | Ht 62.0 in | Wt 144.0 lb

## 2015-12-23 DIAGNOSIS — G4733 Obstructive sleep apnea (adult) (pediatric): Secondary | ICD-10-CM

## 2015-12-23 DIAGNOSIS — Z9989 Dependence on other enabling machines and devices: Secondary | ICD-10-CM

## 2015-12-23 DIAGNOSIS — R0902 Hypoxemia: Secondary | ICD-10-CM

## 2015-12-23 NOTE — Progress Notes (Signed)
SLEEP MEDICINE CLINIC   Provider:  Larey Seat, M D  Referring Provider: [name], MD Primary Care Physician:  [name], MD  Chief Complaint  Patient presents with  . Follow-up    PT REFUSED MOCA, cpap, rm 1, alone    HPI:  Tracey [name] is a 70 y.o. female , seen here as a referral from Dr. Ardeth Perfect for a transfer of sleep care.  Chief complaint according to patient : " re evaluate my apnea and care."  The patient reports that she moved to the [place] area about 3 years ago and still gets supplies for her established CPAP per Mail. She was diagnosed with obstructive sleep apnea in [place], in [place]. [facility] posted the sleep center. The patient was diagnosed on 05/02/2008 the study revealed an AHI of 23 was mild decreases in oxygen level as well as continuous snoring. Then CPAP was used for the second night of the split night polysomnography and her oxygen level improved to normal her snoring resolved and the breathing was totally regulated according to Dr. Shelton Silvas note. Concerning was that there was a ventricular tachycardia as of 27 beats. The patient was evaluated by cardiology afterwards the finding could never be reciprocated. She continued to use CPAP for the last 7 years compliantly but she is a little tired of using a nasal mask that has become uncomfortable and his pressure marks on her face. She is also not sure to what degree apnea still present at this time. The patient stays about 7 months of the year here in [place], and 5 months in [place]. Usually they travel from Thanksgiving to Easter. Her husband witnesses snoring when she naps, but not while on CPAP. Her insomnia improved drastically after CPAP.   Sleep habits are as follows: The patient goes to bed between 9.30 and 10 PM, usually falls asleep promptly. She prefers the left side to sleep , but wakes sometimes up on her back.  She learned to sleep on her back after  breast cancer surgery ( diagnosed Aug 8th 2014 ). The bedroom is described as core, quiet and dark. The patient shares a bedroom with her husband. They're also 2 dogs in the bedroom on the bed. She rises usually once to go to the bathroom sometimes twice. She can fall asleep again fairly promptly. She is not a restless sleeper not kicking or moving or not excessively at night. She rises in the morning at 5 sometimes even 8. She usually wakes spontaneously not based on an alarm setting. She averages 7-8 hours of nocturnal sleep.  She reports vivid dreams, "repetitive and in full color".  Some mornings she feels refreshed and restored but not all the time. She is still recovering from her breast reconstruction surgery. She endorsed some snoring, joint pain, aching muscles and even some dizziness. She also has right shoulder bursitis which limits her ability to sleep on the right. She naps 3 out of 7 days, once  daily, a nap will last  2 hours unless she sets an alarm.   Tracey [name] this past medical history includes gout, hyperlipidemia, hypertension, palpitations probably due to nonsustained ventricular tachycardia. In addition breast cancer of which her mother maternal aunt maternal grandmother and maternal great-grandmother were also affected. She is a history of allergic rhinitis her sister died in 16-Oct-2011 of ALS. Sleep medical history and family sleep history: Her father was diagnosed with OSA never used CPAP ,  had an MI at  age 3. Social history: non smoker, quit at age 24. ETOH , 1-2 at night. Caffeine use : none. Decaffeinated tea and coffee.  Average and regular golf player. She likes winter sports, especially skiing but uses the left dangerous slope these days.  Interval history from 05-20-15. Hearing has been willing to undergo a new sleep study and attended nocturnal polysomnography on 04-09-15. She was diagnosed with a very mild AHI of 5.2 , supine sleep  AHI became 12.4  and it was further evident  that the patient did not get into REM sleep. For this reason I feel it is much more important that the patient stays on CPAP given that her apnea was mild and she was fitted with a new mask. The new mass does not cause pressure marks around her nose and does not entangle her hair. She is using a dream wear interface, which [facility] had to explicitly order for her.  Interval history from 12/23/2015. Tracey [name] has happily returned from [place], I have the pleasure today to see her CPAP download which confirms and 96.7% compliance average user time 6 hours and 39 minutes of CPAP.  The device is a C flex set at 8 cm water pressure with a ramp time of 30 minutes. The REM time is a little too long and we will reduce it to 10 minutes. Her husband has made her aware that she still snores when she doesn't use a CPAP. She has become more compliant she endorsed only 2 points today on the geriatric depression store, 7 points on the Epworth sleepiness score and the fatigue severity at 29.   Review of Systems: Out of a complete 14 system review, the patient complains of only the following symptoms, and all other reviewed systems are negative. Snoring.   Epworth score  7 , Fatigue severity score 29 from 41  , geriatric depression score 3 , her machine is 71 years old , she needs a new one.    Social History   Social History  . Marital Status: Married    Spouse Name: N/A  . Number of Children: 2  . Years of Education: N/A   Occupational History  . Not on file.   Social History Main Topics  . Smoking status: Former Smoker -- 1.00 packs/day for 20 years    Start date: 06/09/1992  . Smokeless tobacco: Never Used     Comment: quit at age 5  . Alcohol Use: 3.0 oz/week    5 Glasses of wine per week     Comment: nightly  . Drug Use: No     Comment: quit 24 years ago  . Sexual Activity: Yes    Birth Control/ Protection: Surgical   Other Topics Concern  . Not on file   Social History Narrative      Family History  Problem Relation Age of Onset  . Breast cancer Mother     possible inflammatory breast cancer  . Heart attack Father   . Heart disease Father   . ALS Sister   . Breast cancer Maternal Grandmother 65  . Heart attack Maternal Grandfather   . Heart attack Paternal Grandfather   . Breast cancer Maternal Aunt     dx in her 16s  . Breast cancer Other     maternal great grandmother; dx in her 23s    Past Medical History  Diagnosis Date  . Arthritis   . Gout     takes Allopurinol daily and Colchicine daily prn;last  attack 50yr ago  . Tachycardia     takes Metoprolol daily  . Hyperlipidemia     takes Pravastatin daily  . Gout   . Breast cancer ([facility] 2014    ER+/PR+/HEr2-,   . Colon polyps     2 polyps by report  . Rhinitis     uses Flonase prn  . Insomnia     takes Ambien nightly prn  . Anxiety     takes Ativan daily prn  . Peripheral edema     takes Furosemide daily prn  . PONV (postoperative nausea and vomiting)   . History of bronchitis     last time at least 832yrago  . Osteoarthritis   . Bruises easily   . GERD (gastroesophageal reflux disease)     occasionally takes Nexium   . Diverticulosis   . History of bladder infections     many yrs ago  . Allergy   . Radiation 07/27/13-09/07/13    Right Breast x 31 treatments  . Ventricular tachycardia, non-sustained ([facility]    during sleep study 2009 with normal cardiac workup  . OSA (obstructive sleep apnea)     on CPAP  . Sinus congestion   . Postmenopausal hormone therapy   . History of stress incontinence   . Status post breast reconstruction 10/15/14    Bilateral implant removal and DIEP performed in [place], [place]    Past Surgical History  Procedure Laterality Date  . Appendectomy    . Gallbladder surgery    . Cholecystectomy    . Cosmetic surgery    . Tonsillectomy    . Abdominal hysterectomy    . Cardiac catheterization  1999  . Colonoscopy    . Bilateral cataract surgery    . Urinary  tightening      d/t urinary incontinence  . Total mastectomy Bilateral 05/23/2013    WITH RECONSTRUCTION  . Total mastectomy Bilateral 05/23/2013    Procedure: TOTAL MASTECTOMY;  Surgeon: ToOdis HollingsheadMD;  [facility]: General;  Laterality: Bilateral;  . Axillary sentinel node biopsy Right 05/23/2013    Procedure: AXILLARY SENTINEL NODE BIOPSY;  Surgeon: ToOdis HollingsheadMD;  [facility]: General;  Laterality: Right;  nuc med injection 7:00  . Breast reconstruction with placement of tissue expander and flex hd (acellular hydrated dermis) Bilateral 05/23/2013    Procedure: BILATERAL BREAST RECONSTRUCTION WITH PLACEMENT OF TISSUE EXPANDER AND FLEX HD;  Surgeon: DaCrissie ReeseMD;  [facility] Service: Plastics;  Laterality: Bilateral;  . Breast reconstruction Bilateral 05/17/2014    W  . Removal of bilateral tissue expanders with placement of bilateral breast implants Bilateral 05/17/2014    Procedure: REMOVAL OF BILATERAL TISSUE EXPANDERS WITH PLACEMENT OF BILATERAL BREAST IMPLANTS FOR RECONSTRUCTION;  Surgeon: DaCrissie ReeseMD;  [facility]: Pl[facility];  Laterality: Bilateral;    Current Outpatient Prescriptions  Medication Sig Dispense Refill  . allopurinol (ZYLOPRIM) 100 MG tablet TAKE 1 TABLET BY MOUTH DAILY 90 tablet 0  . anastrozole (ARIMIDEX) 1 MG tablet TAKE 1 TABLET BY MOUTH EVERY DAY 90 tablet 3  . celecoxib (CELEBREX) 200 MG capsule TK ONE C PO QD  3  . cholecalciferol 5000 UNITS TABS Take 0.2 tablets (1,000 Units total) by mouth daily. 100 tablet 3  . colchicine 0.6 MG tablet TAKE 1 TABLET BY MOUTH EVERY DAY OR AS NEEDED 30 tablet 0  . Cyanocobalamin (VITAMIN B-12 IJ) Inject 1,000 mcg as directed  every 30 (thirty) days. Normally 1st of the month    . cyclobenzaprine (FLEXERIL) 10 MG tablet TK 1 T PO Q 8 H PRF MUSCLE SPASMS  0  . esomeprazole (NEXIUM) 40 MG capsule TAKE ONE CAPSULE BY MOUTH EVERY DAY AT NOON 14 capsule 0  .  fluticasone (FLONASE) 50 MCG/ACT nasal spray Place 1 spray into the nose daily as needed for rhinitis.     . furosemide (LASIX) 40 MG tablet Take 40 mg by mouth daily as needed for fluid.     Marland Kitchen LORazepam (ATIVAN) 0.5 MG tablet Take 0.5 mg by mouth at bedtime as needed for anxiety (or sleep).    . menthol-cetylpyridinium (CEPACOL) 3 MG lozenge Take 1 lozenge (3 mg total) by mouth as needed for sore throat. 100 tablet 12  . pravastatin (PRAVACHOL) 40 MG tablet TAKE 1 TABLET BY MOUTH ONCE DAILY 90 tablet 0  . venlafaxine XR (EFFEXOR-XR) 37.5 MG 24 hr capsule Take in addition to current dose of 150 mg 30 capsule 0   No current facility-administered medications for this visit.    Allergies as of 12/23/2015 - Review Complete 12/23/2015  Allergen Reaction Noted  . Ivp dye [iodinated diagnostic agents] Hives 06/09/2012  . Sulfa antibiotics Swelling 06/09/2012    Vitals: BP 128/84 mmHg  Pulse 88  Resp 20  Ht 5' 2"  (1.575 m)  Wt 144 lb (65.318 kg)  BMI 26.33 kg/m2 Last Weight:  Wt Readings from Last 1 Encounters:  12/23/15 144 lb (65.318 kg)   IZT:IWPY mass index is 26.33 kg/(m^2).     Last Height:   Ht Readings from Last 1 Encounters:  12/23/15 5' 2"  (1.575 m)    Physical exam:  General: The patient is awake, alert and appears not in acute distress. The patient is well groomed. Head: Normocephalic, atraumatic. Neck is supple. Mallampati 4,  neck circumference: 16.25   Nasal airflow unrestricted , TMJ is not  evident. Retrognathia is not seen. She has a crowded lower jar. Cardiovascular:  Regular rate and rhythm , without  murmurs or carotid bruit, and without distended neck veins. Respiratory: Lungs are clear to auscultation. Skin:  Without evidence of edema, or rash Trunk: BMI is 27. The patient's posture is erect.   Neurologic exam : Speech is fluent,  without  dysarthria, dysphonia or aphasia.  Mood and affect are appropriate.  Cranial nerves: Pupils are equal and briskly  reactive to light. Funduscopic exam without  evidence of pallor or edema. Extraocular movements  in vertical and horizontal planes intact and without nystagmus. Visual fields by finger perimetry are intact. Hearing to finger rub intact.   Facial sensation intact to fine touch.  Facial motor strength is symmetric and tongue and uvula move midline. Shoulder shrug was symmetrical. She is very tense over the shoulder area.    The patient was advised of the nature of the diagnosed sleep disorder , the treatment options and risks for general a health and wellness arising from not treating the condition.  I spent more than 25 minutes of face to face time with the patient. Greater than 50% of time was spent in counseling and coordination of care. We have discussed the diagnosis and differential and I answered the patient's questions.   The patient had suffered 1 run of ventricular tachycardia documented and captured in her sleep study. She has not been placed on any medications after she had a cardiology follow-up. She is no longer excessively fatigued or sleepy and when she uses  her machine, she doesn't snore which was confirmed by her husband. She did best with a dream wear nasal pillow, but it still sometimes dislodges at night. She uses a small size and I will investigate if there is an extra small or perhaps a small or headgear that would help her. The headgear is sized medium I don't think that fits her very well. I'm most happy with her CPAP compliance but I will change the ramp time from 30 minutes down. She does not have trouble sleeping and she does not have trouble tolerating higher pressures so I will reduce the ramp time to 10 minutes. She will continue to use CPAP and return in 12 month.    Assessment:  After physical and neurologic examination, review of laboratory studies,  Personal review of imaging studies, reports of other /same  Imaging studies, results of polysomnography/ neurophysiology  testing and pre-existing records as far as provided in visit,  My Assessment is that of a patient with oxygen need when travelling to her winter home in [place]. She was diagnosed with OSA in [place] , in 2009.  She snores when she naps and doesn't use CPAP.  I was able to obtain a compliance report during her office visit. This is dated from 04-01-15, and shows that the patient has used the machine 87% of all nights with over 4 hours of continued use. And 90% of all nights.  She has a CPAP and should now with a new interface  Continue using it.  EPR setting of 1 cm with a baseline setting of 8 cm water a ramp time of 30 minutes and a ramp start pressure of only 4 cm water . She now has gotten used to the CPAP and wishes to reduce the RAMP time.  1)  continues to have mild OSA. weight is not a factor - 148 pounds.  2)  [facility],  per subjective report.  Patient refused [place] last visit. 3) She had a re-do of her breast implant after breast cancer. Had a flap and cosmetic results are very satisfying. [place], [place]- Dr. Jimmye Norman.      Plan:  Treatment plan and additional workup : No MOCA test, she is not willing.  Continued Compliance. Needs a newer machine. DME is [facility].      Asencion Partridge Hanae Waiters MD  12/23/2015   CC: [name], [address], [place] [id number]

## 2015-12-23 NOTE — Patient Instructions (Signed)
I instructed Mrs. Tracey [name] but if she spends the next winter again in [place] and feels that the CPAP is not providing enough pressure or oxygen, we can ask local [place] DME to perform a pulse oximetry while on CPAP. If she remains hypoxic for over 30 minutes or reaches a nadir of 84%, she should be qualifying  for oxygen to be bled into her CPAP.

## 2016-01-01 ENCOUNTER — Telehealth: Payer: Self-pay | Admitting: Neurology

## 2016-01-01 NOTE — Telephone Encounter (Signed)
Pt has an appt tomorrow with [facility] to get her cpap. Pt needs a follow up per Medicare policy. I spoke to pt and she is agreeable to 02/26/16 at 9:00 appt. Pt was reminded to arrive 15 mins early and to bring her cpap. Pt verbalized understanding.

## 2016-01-01 NOTE — Telephone Encounter (Signed)
Spoke to [employer] at [employer]. She has tried contacting the pt but the pt never calls her back, but she will try again this afternoon. I called pt to advise her of this. Pt says she has not gotten a call. I gave her Heather's work number and asked her to call that number if she has not heard anything by this afternoon. Pt verbalized understanding.

## 2016-01-01 NOTE — Telephone Encounter (Signed)
Pt called said at last OV she thought CPAP order was to be faxed to DME [place]. Said she has not heard back from clinic. Please call

## 2016-01-02 ENCOUNTER — Other Ambulatory Visit: Payer: Self-pay | Admitting: Nurse Practitioner

## 2016-01-02 ENCOUNTER — Other Ambulatory Visit: Payer: Self-pay | Admitting: Oncology

## 2016-01-02 DIAGNOSIS — C50411 Malignant neoplasm of upper-outer quadrant of right female breast: Secondary | ICD-10-CM

## 2016-01-05 ENCOUNTER — Other Ambulatory Visit: Payer: Self-pay | Admitting: Oncology

## 2016-01-07 ENCOUNTER — Other Ambulatory Visit: Payer: Self-pay | Admitting: *Deleted

## 2016-01-07 DIAGNOSIS — C50411 Malignant neoplasm of upper-outer quadrant of right female breast: Secondary | ICD-10-CM

## 2016-01-08 ENCOUNTER — Other Ambulatory Visit (HOSPITAL_BASED_OUTPATIENT_CLINIC_OR_DEPARTMENT_OTHER): Payer: Medicare Other

## 2016-01-08 DIAGNOSIS — C50411 Malignant neoplasm of upper-outer quadrant of right female breast: Secondary | ICD-10-CM

## 2016-01-08 LAB — COMPREHENSIVE METABOLIC PANEL
ALT: 30 U/L (ref 0–55)
AST: 24 U/L (ref 5–34)
Albumin: 4.1 g/dL (ref 3.5–5.0)
Alkaline Phosphatase: 100 U/L (ref 40–150)
Anion Gap: 9 mEq/L (ref 3–11)
BUN: 18.1 mg/dL (ref 7.0–26.0)
CHLORIDE: 105 meq/L (ref 98–109)
CO2: 27 meq/L (ref 22–29)
CREATININE: 0.9 mg/dL (ref 0.6–1.1)
Calcium: 9.6 mg/dL (ref 8.4–10.4)
EGFR: 62 mL/min/{1.73_m2} — ABNORMAL LOW (ref >90)
Glucose: 69 mg/dl — ABNORMAL LOW (ref 70–140)
POTASSIUM: 4 meq/L (ref 3.5–5.1)
Sodium: 141 mEq/L (ref 136–145)
Total Bilirubin: 0.3 mg/dL (ref 0.20–1.20)
Total Protein: 7.1 g/dL (ref 6.4–8.3)

## 2016-01-08 LAB — CBC WITH DIFFERENTIAL/PLATELET
BASO%: 0.7 % (ref 0.0–2.0)
Basophils Absolute: 0 10*3/uL (ref 0.0–0.1)
EOS%: 2.5 % (ref 0.0–7.0)
Eosinophils Absolute: 0.1 10*3/uL (ref 0.0–0.5)
HCT: 39.5 % (ref 34.8–46.6)
HGB: 13 g/dL (ref 11.6–15.9)
LYMPH%: 33.1 % (ref 14.0–49.7)
MCH: 29.8 pg (ref 25.1–34.0)
MCHC: 33 g/dL (ref 31.5–36.0)
MCV: 90.3 fL (ref 79.5–101.0)
MONO#: 0.5 10*3/uL (ref 0.1–0.9)
MONO%: 11 % (ref 0.0–14.0)
NEUT#: 2.4 10*3/uL (ref 1.5–6.5)
NEUT%: 52.7 % (ref 38.4–76.8)
Platelets: 189 10*3/uL (ref 145–400)
RBC: 4.37 10*6/uL (ref 3.70–5.45)
RDW: 14.5 % (ref 11.2–14.5)
WBC: 4.6 10*3/uL (ref 3.9–10.3)
lymph#: 1.5 10*3/uL (ref 0.9–3.3)

## 2016-01-15 ENCOUNTER — Telehealth: Payer: Self-pay | Admitting: Nurse Practitioner

## 2016-01-15 ENCOUNTER — Ambulatory Visit (HOSPITAL_BASED_OUTPATIENT_CLINIC_OR_DEPARTMENT_OTHER): Payer: Medicare Other | Admitting: Nurse Practitioner

## 2016-01-15 ENCOUNTER — Encounter: Payer: Self-pay | Admitting: Nurse Practitioner

## 2016-01-15 VITALS — BP 133/72 | HR 94 | Temp 98.3°F | Resp 18 | Ht 62.0 in | Wt 146.8 lb

## 2016-01-15 DIAGNOSIS — C50411 Malignant neoplasm of upper-outer quadrant of right female breast: Secondary | ICD-10-CM | POA: Diagnosis not present

## 2016-01-15 DIAGNOSIS — Z79811 Long term (current) use of aromatase inhibitors: Secondary | ICD-10-CM | POA: Diagnosis not present

## 2016-01-15 DIAGNOSIS — E2839 Other primary ovarian failure: Secondary | ICD-10-CM

## 2016-01-15 NOTE — Progress Notes (Signed)
ID: Tracey [name] OB: [birth year]  MR#: [id number]  WVP#:[id number]  PCP: [name], MD/ [name] GYN:   SU: Tracey [name] OTHER MD: [name], William Hamburger [phone])  PCP: [name], MD GYN: SU: Tracey [name] OTHER MD: Tracey [name], Tracey [name]  CHIEF COMPLAINT:right breast cancer  CURRENT TREATMENT: anastrozole daily.  From the original intent note:  [name] had routine screening mammography July of 2014 showing a suspicious mass in her right breast. Additional views confirmed a mass in the upper outer quadrant of the right breast, which by ultrasound measured 4 mm. Biopsy of this mass was obtained in Tracey [name], at [facility]. 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  The patient's subsequent history is as detailed below  INTERVAL HISTORY: Tracey [name] returns today for followup of her breast cancer, alone. She continues on anastrozole daily and tolerates this well with no side effects that she is aware of. The interval history is generally unremarkable.   REVIEW OF SYSTEMS: Tracey [name] has few physical complaints today. She has right upper extremity lymphedema, but does not wear her sleeve during the day. She sleeps with a cpap at night. She continues to golf, despite a slight decreased range of motion to the right arm. She is in no pain. She had crying spells for a time, but venlafaxine seems to manage these well. She has some heartburn. A detailed review of systems is otherwise stable.   PAST MEDICAL HISTORY: Past Medical History  Diagnosis Date  . Arthritis   . Gout     takes Allopurinol daily and Colchicine daily prn;last attack 35yr ago  . Tachycardia     takes Metoprolol daily  . Hyperlipidemia     takes Pravastatin daily  . Gout   . Breast cancer ([facility] 2014    ER+/PR+/HEr2-,   . Colon polyps    2 polyps by report  . Rhinitis     uses Flonase prn  . Insomnia     takes Ambien nightly prn  . Anxiety     takes Ativan daily prn  . Peripheral edema     takes Furosemide daily prn  . PONV (postoperative nausea and vomiting)   . History of bronchitis     last time at least 825yrago  . Osteoarthritis   . Bruises easily   . GERD (gastroesophageal reflux disease)     occasionally takes Nexium   . Diverticulosis   . History of bladder infections     many yrs ago  . Allergy   . Radiation 07/27/13-09/07/13    Right Breast x 31 treatments  . Ventricular tachycardia, non-sustained ([facility]    during sleep study 2009 with normal cardiac workup  . OSA (obstructive sleep apnea)     on CPAP  . Sinus congestion   . Postmenopausal hormone therapy   . History of stress incontinence   . Status post breast reconstruction 10/15/14    Bilateral implant removal and DIEP performed in [place], [place]    PAST SURGICAL HISTORY: Past Surgical History  Procedure Laterality Date  . Appendectomy    . Gallbladder surgery    . Cholecystectomy    . Cosmetic surgery    . Tonsillectomy    . Abdominal hysterectomy    . Cardiac catheterization  1999  . Colonoscopy    . Bilateral cataract surgery    . Urinary tightening  d/t urinary incontinence  . Total mastectomy Bilateral 05/23/2013    WITH RECONSTRUCTION  . Total mastectomy Bilateral 05/23/2013    Procedure: TOTAL MASTECTOMY;  Surgeon: [name], MD;  Location: [facility];  Service: General;  Laterality: Bilateral;  . Axillary sentinel node biopsy Right 05/23/2013    Procedure: AXILLARY SENTINEL NODE BIOPSY;  Surgeon: [name], MD;  Location: [facility];  Service: General;  Laterality: Right;  nuc med injection 7:00  . Breast reconstruction with placement of tissue expander and flex hd (acellular hydrated dermis) Bilateral 05/23/2013    Procedure: BILATERAL BREAST RECONSTRUCTION WITH PLACEMENT OF TISSUE EXPANDER AND FLEX HD;  Surgeon: Tracey Reese, MD;  Location: [facility];  Service: Plastics;  Laterality: Bilateral;  . Breast reconstruction Bilateral 05/17/2014    W  . Removal of bilateral tissue expanders with placement of bilateral breast implants Bilateral 05/17/2014    Procedure: REMOVAL OF BILATERAL TISSUE EXPANDERS WITH PLACEMENT OF BILATERAL BREAST IMPLANTS FOR RECONSTRUCTION;  Surgeon: Tracey Reese, MD;  Location: [facility];  Service: Plastics;  Laterality: Bilateral;    FAMILY HISTORY Family History  Problem Relation Age of Onset  . Breast cancer Mother     possible inflammatory breast cancer  . Heart attack Father   . Heart disease Father   . ALS Sister   . Breast cancer Maternal Grandmother 13  . Heart attack Maternal Grandfather   . Heart attack Paternal Grandfather   . Breast cancer Maternal Aunt     dx in her 7s  . Breast cancer Other     maternal great grandmother; dx in her 71s   the patient's father died from heart disease at the age of 5. The patient's mother died from breast cancer at the age of 34. It is not clear when she was diagnosed since she "dictated and". The patient had no brothers. One sister died from [place] at the age of 89 in addition, one of the patient's mother is sisters was diagnosed with breast cancer in her 64s. The patient's mother is mother, was also diagnosed with breast cancer, at the age of 51. The patient's daughter, Tracey [name], has been tested for the BRCA genes and was negative.  GYNECOLOGIC HISTORY:  Menarche age 45, first live birth age 31, the patient is [place] P2. She underwent total abdominal hysterectomy and bilateral salpingo-oophorectomy at the age of 15 for endometriosis. She took hormone replacement until August of 2014.  SOCIAL HISTORY:  [name] recently moved to [facility] and is planning to make this area her "home-base", although they also have a home in [place] where they go for the winter to ski. 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 They are now enjoying their retirement. Daughter Tracey [name] lives in [place]. A younger daughter Tracey [name] is a homemaker in [place]. The patient has 3 grandchildren. She is a Furniture conservator/restorer.    ADVANCED DIRECTIVES: In place   HEALTH MAINTENANCE: Social History  Substance Use Topics  . Smoking status: Former Smoker -- 1.00 packs/day for 20 years    Start date: 06/09/1992  . Smokeless tobacco: Never Used     Comment: quit at age 54  . Alcohol Use: 3.0 oz/week    5 Glasses of wine per week     Comment: nightly     Colonoscopy: 2010  PAP: August 2014  Bone density: 2010, normal per patient  Lipid panel:  Allergies  Allergen Reactions  . Ivp Dye [Iodinated Diagnostic Agents] Hives  . Sulfa Antibiotics Swelling    Current Outpatient Prescriptions  Medication Sig Dispense Refill  . allopurinol (ZYLOPRIM) 100 MG tablet TAKE 1 TABLET BY MOUTH DAILY 90 tablet 0  . anastrozole (ARIMIDEX) 1 MG tablet TAKE 1 TABLET BY MOUTH EVERY DAY 30 tablet 0  . celecoxib (CELEBREX) 200 MG capsule TK ONE C PO QD  3  . cholecalciferol 5000 UNITS TABS Take 0.2 tablets (1,000 Units total) by mouth daily. 100 tablet 3  . colchicine 0.6 MG tablet TAKE 1 TABLET BY MOUTH EVERY DAY OR AS NEEDED 30 tablet 0  . Cyanocobalamin (VITAMIN B-12 IJ) Inject 1,000 mcg as directed every 30 (thirty) days. Normally 1st of the month    . cyclobenzaprine (FLEXERIL) 10 MG tablet TK 1 T PO Q 8 H PRF MUSCLE SPASMS  0  . esomeprazole (NEXIUM) 40 MG capsule TAKE ONE CAPSULE BY MOUTH EVERY DAY AT NOON 14 capsule 0  . fluticasone (FLONASE) 50 MCG/ACT nasal spray Place 1 spray into the nose daily as needed for rhinitis.     . furosemide (LASIX) 40 MG tablet Take 40 mg by mouth daily as needed for fluid.     . LORazepam (ATIVAN) 0.5 MG tablet Take 0.5 mg by mouth at bedtime as needed for anxiety (or sleep).    . menthol-cetylpyridinium (CEPACOL) 3 MG lozenge Take 1 lozenge (3 mg total) by  mouth as needed for sore throat. 100 tablet 12  . pravastatin (PRAVACHOL) 40 MG tablet TAKE 1 TABLET BY MOUTH ONCE DAILY 90 tablet 0  . venlafaxine XR (EFFEXOR-XR) 37.5 MG 24 hr capsule TAKE 1 CAPSULE BY MOUTH ONCE DAILY IN ADDITION TO CURRENT DOSE OF 150MG 30 capsule 1   No current facility-administered medications for this visit.    OBJECTIVE: Middle-aged white woman who appears stated age Filed Vitals:   01/15/16 1417  BP: 133/72  Pulse: 94  Temp: 98.3 F (36.8 C)  Resp: 18     Body mass index is 26.84 kg/(m^2).    ECOG FS:1 - Symptomatic but completely ambulatory Filed Weights   01/15/16 1417  Weight: 146 lb 12.8 oz (66.588 kg)    Skin: warm, dry  HEENT: sclerae anicteric, conjunctivae pink, oropharynx clear. No thrush or mucositis.  Lymph Nodes: No cervical or supraclavicular lymphadenopathy  Lungs: clear to auscultation bilaterally, no rales, wheezes, or rhonci  Heart: regular rate and rhythm  Abdomen: round, soft, non tender, positive bowel sounds  Musculoskeletal: No focal spinal tenderness, no peripheral edema  Neuro: non focal, well oriented, positive affect  Breasts: bilateral breasts status post DIAP reconstruction. No evidence of recurrent disease. Bilateral axillae benign.   LAB RESULTS:   Lab Results  Component Value Date   WBC 4.6 01/08/2016   NEUTROABS 2.4 01/08/2016   HGB 13.0 01/08/2016   HCT 39.5 01/08/2016   MCV 90.3 01/08/2016   PLT 189 01/08/2016      Chemistry      Component Value Date/Time   NA 141 01/08/2016 1425   NA 137 05/09/2014 1231   K 4.0 01/08/2016 1425   K 4.0 05/09/2014 1231   CL 99 05/09/2014 1231   CO2 27 01/08/2016 1425   CO2 20 05/09/2014 1231   BUN 18.1 01/08/2016 1425   BUN 14 05/09/2014 1231   CREATININE 0.9 01/08/2016 1425   CREATININE 0.82 05/09/2014 1231      Component Value Date/Time   CALCIUM 9.6 01/08/2016 1425     CALCIUM 9.5 05/09/2014 1231   ALKPHOS 100 01/08/2016 1425   ALKPHOS 71 05/16/2013 1540   AST  24 01/08/2016 1425   AST 23 05/16/2013 1540   ALT 30 01/08/2016 1425   ALT 22 05/16/2013 1540   BILITOT 0.30 01/08/2016 1425   BILITOT 0.4 05/16/2013 1540       STUDIES: No results found.   ASSESSMENT: 70 y.o. BRCA 1 & 2 negative [place] woman   (1)  status post right breast upper outer quadrant biopsy 03/24/2013 for a pT1b cN0, stage IA invasive ductal carcinoma, grade 1, strongly estrogen and progesterone receptor positive, HER-2 negative  (2) status post bilateral mastectomies 05/23/2013 showing:  (a) on the right, a pT1b pN0, stage invasive ductal carcinoma, grade 1, again HER-2 negative  (b) on the left, no evidence of malignancy  (3) Oncotype DX score of 20 predicted a rate of distant recurrence within 10 years of 13% if the patient's only systemic treatment was tamoxifen for 5 years.  (4) a positive margin required adjuvant radiation, completed January 2015 in [place]  (5) status post bilateral implant placement January 2015  (a) with impending right implant rupture February 2016 underwent bilateral DIEP reconstruction under Dr. Kandyce [name] in [place] (faxed [phone])  (6)  started on anastrozole, mid September 2014, prior to definitive surgery; continued postop; bone density 07/31/2013 was normal    PLAN: Ndidi is doing well as far as her breast cancer is concerned. She is now 2.5 years out from her bilateral mastectomies with no evidence of recurrent disease. The labs were reviewed in detail and were stable. She is tolerating the anastrozole well and will continue this drug for 5 years of antiestrogen therapy.   She is overdue for a repeat bone density scan. [facility]   Chela will return in 1 year for labs and a follow up visit. She understands and agrees with this plan. She knows the goal of treatment in her case is cure. She has been encouraged to call with any issues that might arise  before her next visit here.   Laurie Panda, NP   01/15/2016 2:43 PM

## 2016-01-15 NOTE — Telephone Encounter (Signed)
appt made and avs printed °

## 2016-01-24 DIAGNOSIS — M109 Gout, unspecified: Secondary | ICD-10-CM | POA: Diagnosis not present

## 2016-01-24 DIAGNOSIS — I10 Essential (primary) hypertension: Secondary | ICD-10-CM | POA: Diagnosis not present

## 2016-01-24 DIAGNOSIS — E784 Other hyperlipidemia: Secondary | ICD-10-CM | POA: Diagnosis not present

## 2016-01-24 DIAGNOSIS — E538 Deficiency of other specified B group vitamins: Secondary | ICD-10-CM | POA: Diagnosis not present

## 2016-02-03 ENCOUNTER — Ambulatory Visit
Admission: RE | Admit: 2016-02-03 | Discharge: 2016-02-03 | Disposition: A | Discharge status: Home / Self Care | Payer: Medicare Other | Source: Ambulatory Visit | Attending: Nurse Practitioner | Admitting: Nurse Practitioner

## 2016-02-03 DIAGNOSIS — Z13820 Encounter for screening for osteoporosis: Secondary | ICD-10-CM | POA: Diagnosis not present

## 2016-02-03 DIAGNOSIS — M25562 Pain in left knee: Secondary | ICD-10-CM | POA: Diagnosis not present

## 2016-02-03 DIAGNOSIS — Z1389 Encounter for screening for other disorder: Secondary | ICD-10-CM | POA: Diagnosis not present

## 2016-02-03 DIAGNOSIS — E2839 Other primary ovarian failure: Secondary | ICD-10-CM

## 2016-02-03 DIAGNOSIS — C50911 Malignant neoplasm of unspecified site of right female breast: Secondary | ICD-10-CM | POA: Diagnosis not present

## 2016-02-03 DIAGNOSIS — I25118 Atherosclerotic heart disease of native coronary artery with other forms of angina pectoris: Secondary | ICD-10-CM | POA: Diagnosis not present

## 2016-02-03 DIAGNOSIS — I10 Essential (primary) hypertension: Secondary | ICD-10-CM | POA: Diagnosis not present

## 2016-02-03 DIAGNOSIS — T148 Other injury of unspecified body region: Secondary | ICD-10-CM | POA: Diagnosis not present

## 2016-02-03 DIAGNOSIS — Z0000 Encounter for general adult medical examination without abnormal findings: Secondary | ICD-10-CM | POA: Diagnosis not present

## 2016-02-03 DIAGNOSIS — J329 Chronic sinusitis, unspecified: Secondary | ICD-10-CM | POA: Diagnosis not present

## 2016-02-03 DIAGNOSIS — Z23 Encounter for immunization: Secondary | ICD-10-CM | POA: Diagnosis not present

## 2016-02-03 DIAGNOSIS — F418 Other specified anxiety disorders: Secondary | ICD-10-CM | POA: Diagnosis not present

## 2016-02-13 ENCOUNTER — Other Ambulatory Visit: Payer: Self-pay | Admitting: Oncology

## 2016-02-26 ENCOUNTER — Ambulatory Visit (INDEPENDENT_AMBULATORY_CARE_PROVIDER_SITE_OTHER): Payer: Medicare Other | Admitting: Neurology

## 2016-02-26 ENCOUNTER — Encounter: Payer: Self-pay | Admitting: Neurology

## 2016-02-26 VITALS — BP 142/82 | HR 86 | Resp 20 | Ht 62.0 in | Wt 150.0 lb

## 2016-02-26 DIAGNOSIS — G4733 Obstructive sleep apnea (adult) (pediatric): Secondary | ICD-10-CM | POA: Diagnosis not present

## 2016-02-26 DIAGNOSIS — Z9989 Dependence on other enabling machines and devices: Secondary | ICD-10-CM

## 2016-02-26 NOTE — Progress Notes (Signed)
SLEEP MEDICINE CLINIC   Provider:  Larey Seat, M D  Referring Provider: [name], MD Primary Care Physician:  [name], MD  Chief Complaint  Patient presents with  . Follow-up    recevied new cpap and sleeps a lot better    HPI:  Tracey [name] is a 70 y.o. female , seen here as a referral from Dr. Ardeth Perfect for a transfer of sleep care.  Chief complaint according to patient : " re evaluate my apnea and care."  The patient reports that she moved to the [place] area about 3 years ago and still gets supplies for her established CPAP per Mail. She was diagnosed with obstructive sleep apnea in [place], in [place]. The [facility] posted the sleep center. The patient was diagnosed on 05/02/2008 the study revealed an AHI of 23 was mild decreases in oxygen level as well as continuous snoring. Then CPAP was used for the second night of the split night polysomnography and her oxygen level improved to normal her snoring resolved and the breathing was totally regulated according to Dr. Shelton Silvas note. Concerning was that there was a ventricular tachycardia as of 27 beats. The patient was evaluated by cardiology afterwards the finding could never be reciprocated. She continued to use CPAP for the last 7 years compliantly but she is a little tired of using a nasal mask that has become uncomfortable and his pressure marks on her face. She is also not sure to what degree apnea still present at this time. The patient stays about 7 months of the year here in [place], and 5 months in [place]. Usually they travel from Thanksgiving to Easter. Her husband witnesses snoring when she naps, but not while on CPAP. Her insomnia improved drastically after CPAP.   Sleep habits are as follows: The patient goes to bed between 9.30 and 10 PM, usually falls asleep promptly. She prefers the left side to sleep , but wakes sometimes up on her back.  She learned to sleep on her back  after breast cancer surgery ( diagnosed Aug 8th 2014 ). The bedroom is described as core, quiet and dark. The patient shares a bedroom with her husband. They're also 2 dogs in the bedroom on the bed. She rises usually once to go to the bathroom sometimes twice. She can fall asleep again fairly promptly. She is not a restless sleeper not kicking or moving or not excessively at night. She rises in the morning at 5 sometimes even 8. She usually wakes spontaneously not based on an alarm setting. She averages 7-8 hours of nocturnal sleep.  She reports vivid dreams, "repetitive and in full color".  Some mornings she feels refreshed and restored but not all the time. She is still recovering from her breast reconstruction surgery. She endorsed some snoring, joint pain, aching muscles and even some dizziness. She also has right shoulder bursitis which limits her ability to sleep on the right. She naps 3 out of 7 days, once  daily, a nap will last  2 hours unless she sets an alarm.   [name] this past medical history includes gout, hyperlipidemia, hypertension, palpitations probably due to nonsustained ventricular tachycardia. In addition breast cancer of which her mother maternal aunt maternal grandmother and maternal great-grandmother were also affected. She is a history of allergic rhinitis her sister died in 09-29-2011 of ALS. Sleep medical history and family sleep history: Her father was diagnosed with OSA never used CPAP ,  had an MI  at age 21. Social history: non smoker, quit at age 56. ETOH , 1-2 at night. Caffeine use : none. Decaffeinated tea and coffee.  Average and regular golf player. She likes winter sports, especially skiing but uses the left dangerous slope these days.  Interval history from 05-20-15. Hearing has been willing to undergo a new sleep study and attended nocturnal polysomnography on 04-09-15. She was diagnosed with a very mild AHI of 5.2 , supine sleep  AHI became 12.4  and it was further  evident that the patient did not get into REM sleep. For this reason I feel it is much more important that the patient stays on CPAP given that her apnea was mild and she was fitted with a new mask. The new mass does not cause pressure marks around her nose and does not entangle her hair. She is using a dream wear interface, which [employer] had to explicitly order for her.  Interval history from 12/23/2015. [name] has happily returned from [place], I have the pleasure today to see her CPAP download which confirms and 96.7% compliance average user time 6 hours and 39 minutes of CPAP. The device is a C flex set at 8 cm water pressure with a ramp time of 30 minutes. The REM time is a little too long and we will reduce it to 10 minutes. Her husband has made her aware that she still snores when she doesn't use a CPAP. She has become more compliant she endorsed only 2 points today on the geriatric depression store, 7 points on the Epworth sleepiness score and the fatigue severity at 29.  Interval history from 02/26/2016. [name] is here today relaxed after a week vacation. She reports sleeping so much better with the new CPAP machine. Her sleep is deeper but she does not recall having nightmares she has fallen out of bed 1 night it was actually the third night she used the new CPAP. She feels her sleep is less fragmented, more sound, and more refreshing and restorative. Her download revealed 100% compliance over the last 30 days was 97% compliance 4 hours of use, average user time is 7 hours and 41 minutes. This is excellent compliance with CPAP is set at 8 cm water we did  use a 10 minute ramp function. Her snoring has of course been alleviated using CPAP, which pleases her husband. She has a residual AHI of 8.1 but given her decrease and sleepiness/ fatigue I would not want to change the settings.   Review of Systems: Out of a complete 14 system review, the patient complains of only the following  symptoms, and all other reviewed systems are negative. Snoring.   Epworth score  6 , Fatigue severity score 29 from 41  , geriatric depression score 2 , her machine is new. Nasal pillow. No air-leak data .    Social History   Social History  . Marital Status: Married    Spouse Name: N/A  . Number of Children: 2  . Years of Education: N/A   Occupational History  . Not on file.   Social History Main Topics  . Smoking status: Former Smoker -- 1.00 packs/day for 20 years    Start date: 06/09/1992  . Smokeless tobacco: Never Used     Comment: quit at age 38  . Alcohol Use: 3.0 oz/week    5 Glasses of wine per week     Comment: nightly  . Drug Use: No     Comment: quit  24 years ago  . Sexual Activity: Yes    Birth Control/ Protection: Surgical   Other Topics Concern  . Not on file   Social History Narrative    Family History  Problem Relation Age of Onset  . Breast cancer Mother     possible inflammatory breast cancer  . Heart attack Father   . Heart disease Father   . ALS Sister   . Breast cancer Maternal Grandmother 12  . Heart attack Maternal Grandfather   . Heart attack Paternal Grandfather   . Breast cancer Maternal Aunt     dx in her 40s  . Breast cancer Other     maternal great grandmother; dx in her 18s    Past Medical History  Diagnosis Date  . Arthritis   . Gout     takes Allopurinol daily and Colchicine daily prn;last attack 35yr ago  . Tachycardia     takes Metoprolol daily  . Hyperlipidemia     takes Pravastatin daily  . Gout   . Breast cancer ([facility] 2014    ER+/PR+/HEr2-,   . Colon polyps     2 polyps by report  . Rhinitis     uses Flonase prn  . Insomnia     takes Ambien nightly prn  . Anxiety     takes Ativan daily prn  . Peripheral edema     takes Furosemide daily prn  . PONV (postoperative nausea and vomiting)   . History of bronchitis     last time at least 838yrago  . Osteoarthritis   . Bruises easily   . GERD  (gastroesophageal reflux disease)     occasionally takes Nexium   . Diverticulosis   . History of bladder infections     many yrs ago  . Allergy   . Radiation 07/27/13-09/07/13    Right Breast x 31 treatments  . Ventricular tachycardia, non-sustained ([facility]    during sleep study 2009 with normal cardiac workup  . OSA (obstructive sleep apnea)     on CPAP  . Sinus congestion   . Postmenopausal hormone therapy   . History of stress incontinence   . Status post breast reconstruction 10/15/14    Bilateral implant removal and DIEP performed in [place], [place]    Past Surgical History  Procedure Laterality Date  . Appendectomy    . Gallbladder surgery    . Cholecystectomy    . Cosmetic surgery    . Tonsillectomy    . Abdominal hysterectomy    . Cardiac catheterization  1999  . Colonoscopy    . Bilateral cataract surgery    . Urinary tightening      d/t urinary incontinence  . Total mastectomy Bilateral 05/23/2013    WITH RECONSTRUCTION  . Total mastectomy Bilateral 05/23/2013    Procedure: TOTAL MASTECTOMY;  Surgeon: ToOdis HollingsheadMD;  [facility] Service: General;  Laterality: Bilateral;  . Axillary sentinel node biopsy Right 05/23/2013    Procedure: AXILLARY SENTINEL NODE BIOPSY;  Surgeon: ToOdis HollingsheadMD;  [facility] Service: General;  Laterality: Right;  nuc med injection 7:00  . Breast reconstruction with placement of tissue expander and flex hd (acellular hydrated dermis) Bilateral 05/23/2013    Procedure: BILATERAL BREAST RECONSTRUCTION WITH PLACEMENT OF TISSUE EXPANDER AND FLEX HD;  Surgeon: DaCrissie ReeseMD;  [facility] Service: Plastics;  Laterality: Bilateral;  . Breast reconstruction Bilateral 05/17/2014    W  . Removal of  bilateral tissue expanders with placement of bilateral breast implants Bilateral 05/17/2014    Procedure: REMOVAL OF BILATERAL TISSUE EXPANDERS WITH PLACEMENT OF BILATERAL BREAST IMPLANTS FOR RECONSTRUCTION;  Surgeon: [name], MD;  Location: [facility];  Service: Plastics;  Laterality: Bilateral;    Current Outpatient Prescriptions  Medication Sig Dispense Refill  . allopurinol (ZYLOPRIM) 100 MG tablet TAKE 1 TABLET BY MOUTH DAILY 90 tablet 0  . anastrozole (ARIMIDEX) 1 MG tablet TAKE 1 TABLET BY MOUTH EVERY DAY 30 tablet 0  . celecoxib (CELEBREX) 200 MG capsule TK ONE C PO QD  3  . cholecalciferol 5000 UNITS TABS Take 0.2 tablets (1,000 Units total) by mouth daily. 100 tablet 3  . colchicine 0.6 MG tablet TAKE 1 TABLET BY MOUTH EVERY DAY OR AS NEEDED 30 tablet 0  . Cyanocobalamin (VITAMIN B-12 IJ) Inject 1,000 mcg as directed every 30 (thirty) days. Normally 1st of the month    . cyclobenzaprine (FLEXERIL) 10 MG tablet TK 1 T PO Q 8 H PRF MUSCLE SPASMS  0  . esomeprazole (NEXIUM) 40 MG capsule TAKE ONE CAPSULE BY MOUTH EVERY DAY AT NOON 14 capsule 0  . fluticasone (FLONASE) 50 MCG/ACT nasal spray Place 1 spray into the nose daily as needed for rhinitis.     . furosemide (LASIX) 40 MG tablet Take 40 mg by mouth daily as needed for fluid.     Marland Kitchen LORazepam (ATIVAN) 0.5 MG tablet Take 0.5 mg by mouth at bedtime as needed for anxiety (or sleep).    . menthol-cetylpyridinium (CEPACOL) 3 MG lozenge Take 1 lozenge (3 mg total) by mouth as needed for sore throat. 100 tablet 12  . pravastatin (PRAVACHOL) 40 MG tablet TAKE 1 TABLET BY MOUTH ONCE DAILY 90 tablet 0  . venlafaxine XR (EFFEXOR-XR) 150 MG 24 hr capsule TAKE 1 CAPSULE BY MOUTH DAILY WITH BREAKFAST 90 capsule 0  . venlafaxine XR (EFFEXOR-XR) 37.5 MG 24 hr capsule TAKE 1 CAPSULE BY MOUTH ONCE DAILY IN ADDITION TO CURRENT DOSE OF 150MG 30 capsule 1   No current facility-administered medications for this visit.    Allergies as of 02/26/2016 - Review Complete 02/26/2016  Allergen Reaction Noted  . Ivp dye [iodinated diagnostic agents] Hives 06/09/2012  . Sulfa antibiotics Swelling 06/09/2012    Vitals: BP 142/82 mmHg  Pulse 86  Resp 20  Ht 5' 2" (1.575 m)   Wt 150 lb (68.04 kg)  BMI 27.43 kg/m2 Last Weight:  Wt Readings from Last 1 Encounters:  02/26/16 150 lb (68.04 kg)   JOA:CZYS mass index is 27.43 kg/(m^2).     Last Height:   Ht Readings from Last 1 Encounters:  02/26/16 5' 2" (1.575 m)    Physical exam:  General: The patient is awake, alert and appears not in acute distress. The patient is well groomed. Head: Normocephalic, atraumatic. Neck is supple. Mallampati 4,  neck circumference: 16.25   Nasal airflow unrestricted , TMJ is not  evident. Retrognathia is not seen. She has a crowded lower jar. Cardiovascular:  Regular rate and rhythm , without  murmurs or carotid bruit, and without distended neck veins. Respiratory: Lungs are clear to auscultation. Skin:  Without evidence of edema, or rash Trunk: BMI is 27. The patient's posture is erect.   Neurologic exam : Speech is fluent,  without  dysarthria, dysphonia or aphasia.  Mood and affect are appropriate.  Cranial nerves: Pupils are equal and briskly reactive to light. Extraocular movement are intact. Hearing to finger rub  intact. Facial sensation intact to fine touch.Facial motor strength is symmetric and tongue and uvula move midline. Shoulder shrug was symmetrical. She is very tense over the shoulder area.   The patient was advised of the nature of the diagnosed sleep disorder , the treatment options and risks for general a health and wellness arising from not treating the condition.  I spent more than 25 minutes of face to face time with the patient. Greater than 50% of time was spent in counseling and coordination of care. We have discussed the diagnosis and differential and I answered the patient's questions.   The patient had suffered 1 run of ventricular tachycardia documented and captured in her sleep study. She has not been placed on any medications after she had a cardiology follow-up. She is no longer excessively fatigued or sleepy and when she uses her machine, she  doesn't snore which was confirmed by her husband. She did best with a dream wear nasal pillow, but it still sometimes dislodges at night. She uses a small size and I will investigate if there is an extra small or perhaps a smaller headgear that would help her. The headgear is sized medium I don't think that fits her very well. She has used the dream ware under theinterface. She has woken up from air leaks. I'm most happy with her CPAP compliance ,the ramp time went from 30 minutes down to 10 minutes.  She does not have trouble sleeping and she does not have trouble tolerating higher pressures so I will reduce the ramp time further to 5 minutes. She will continue to use CPAP and return in 12 month.    Assessment:  After physical and neurologic examination, review of laboratory studies,  Personal review of imaging studies, reports of other /same  Imaging studies, results of polysomnography/ neurophysiology testing and pre-existing records as far as provided in visit,  My Assessment is that of a patient with oxygen need when travelling to her winter home in [place]. She was diagnosed with OSA in [place] , in 2009.  She snores when she naps and doesn't use CPAP.  I was able to obtain a compliance report during her office visit. This is dated from 04-01-15, and shows that the patient has used the machine 87% of all nights with over 4 hours of continued use. And 90% of all nights.    Dear Dr. Ardeth Perfect,  Mrs. Lemaster reports being very happy with the  new durable medical equipment Company ( [employer] ) which has served her well. I would like for her to reduce the ramp time of 5 minutes, she is still complaining of some air leakage and I will have Melissa at our [facility]ero care office evaluated if the headgear is too large. In addition I will ask if he can further specify a residual AHI by central or obstructive apneas and also for documentation of the air leaks could be part of the download. I will see the patient  in 12 months.    Asencion Partridge Brondon Wann MD  02/26/2016   CC: [name], [place], [place] [address]

## 2016-02-27 ENCOUNTER — Other Ambulatory Visit: Payer: Self-pay | Admitting: Internal Medicine

## 2016-02-27 DIAGNOSIS — M26602 Left temporomandibular joint disorder, unspecified: Secondary | ICD-10-CM

## 2016-02-28 ENCOUNTER — Telehealth: Payer: Self-pay

## 2016-02-28 DIAGNOSIS — Z1212 Encounter for screening for malignant neoplasm of rectum: Secondary | ICD-10-CM | POA: Diagnosis not present

## 2016-02-28 NOTE — Telephone Encounter (Signed)
LMOVM - identified voice mail.  Bone density results normal.  Pt to call clinic if she has any questions.

## 2016-03-16 DIAGNOSIS — R509 Fever, unspecified: Secondary | ICD-10-CM | POA: Diagnosis not present

## 2016-03-16 DIAGNOSIS — J329 Chronic sinusitis, unspecified: Secondary | ICD-10-CM | POA: Diagnosis not present

## 2016-03-16 DIAGNOSIS — R591 Generalized enlarged lymph nodes: Secondary | ICD-10-CM | POA: Diagnosis not present

## 2016-03-16 DIAGNOSIS — R350 Frequency of micturition: Secondary | ICD-10-CM | POA: Diagnosis not present

## 2016-03-16 DIAGNOSIS — J029 Acute pharyngitis, unspecified: Secondary | ICD-10-CM | POA: Diagnosis not present

## 2016-03-16 DIAGNOSIS — Z6826 Body mass index (BMI) 26.0-26.9, adult: Secondary | ICD-10-CM | POA: Diagnosis not present

## 2016-03-17 ENCOUNTER — Other Ambulatory Visit: Payer: Self-pay | Admitting: Internal Medicine

## 2016-03-17 DIAGNOSIS — J329 Chronic sinusitis, unspecified: Secondary | ICD-10-CM

## 2016-03-17 DIAGNOSIS — R591 Generalized enlarged lymph nodes: Secondary | ICD-10-CM

## 2016-03-20 ENCOUNTER — Other Ambulatory Visit: Payer: Self-pay | Admitting: Oncology

## 2016-03-23 ENCOUNTER — Other Ambulatory Visit: Payer: Self-pay | Admitting: *Deleted

## 2016-03-23 ENCOUNTER — Ambulatory Visit
Admission: RE | Admit: 2016-03-23 | Discharge: 2016-03-23 | Disposition: A | Discharge status: Home / Self Care | Payer: Medicare Other | Source: Ambulatory Visit | Attending: Internal Medicine | Admitting: Internal Medicine

## 2016-03-23 DIAGNOSIS — J329 Chronic sinusitis, unspecified: Secondary | ICD-10-CM

## 2016-03-23 DIAGNOSIS — J0101 Acute recurrent maxillary sinusitis: Secondary | ICD-10-CM | POA: Diagnosis not present

## 2016-03-23 DIAGNOSIS — R591 Generalized enlarged lymph nodes: Secondary | ICD-10-CM

## 2016-03-23 DIAGNOSIS — R599 Enlarged lymph nodes, unspecified: Secondary | ICD-10-CM | POA: Diagnosis not present

## 2016-03-23 IMAGING — CT CT MAXILLOFACIAL W/O CM
3 of 4 series · 14 of 47 positions shown, 17 images · non-contrast
Comparison: None.

CLINICAL DATA: 70-year-old female with recurrent sinusitis, fever
and headaches with difficulty swallowing for 2 months. Breast cancer
post radiation therapy and mastectomy [WR]. Initial encounter.

EXAM:
CT MAXILLOFACIAL WITHOUT CONTRAST
TECHNIQUE: Multidetector CT imaging of the maxillofacial structures was
performed. Multiplanar CT image reconstructions were also generated.
A small metallic BB was placed on the right temple in order to
reliably differentiate right from left.

[Series 3: maxofacial soft · axial · 0.32mm/px · z∈[-128,-44]mm · 10 of 34 slices shown, 13 images]
[im 3/34  brain]
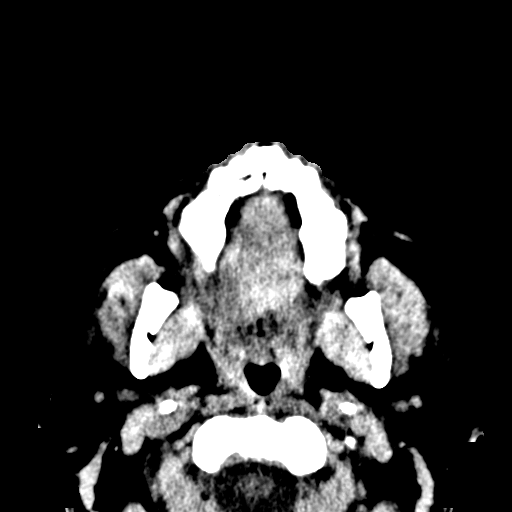
[im 3/34  bone]
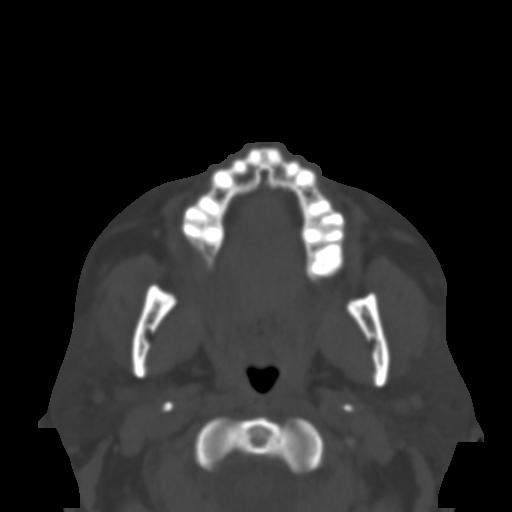
[im 6/34  bone]
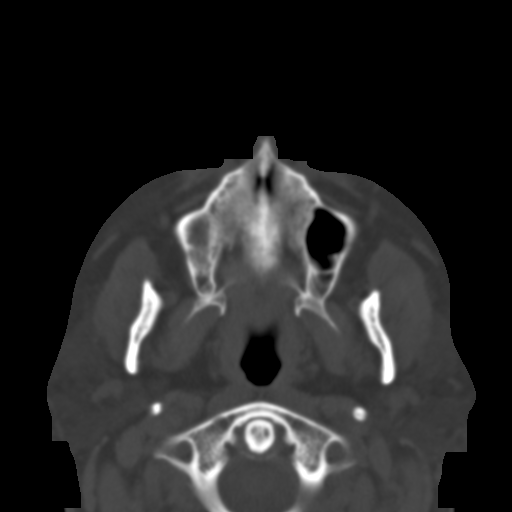
[im 10/34  bone]
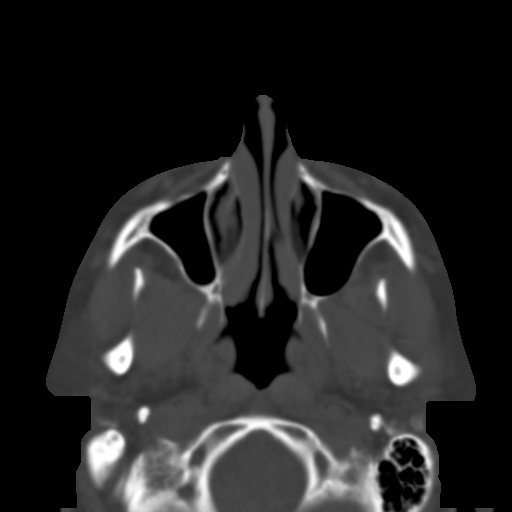
[im 12/34  bone]
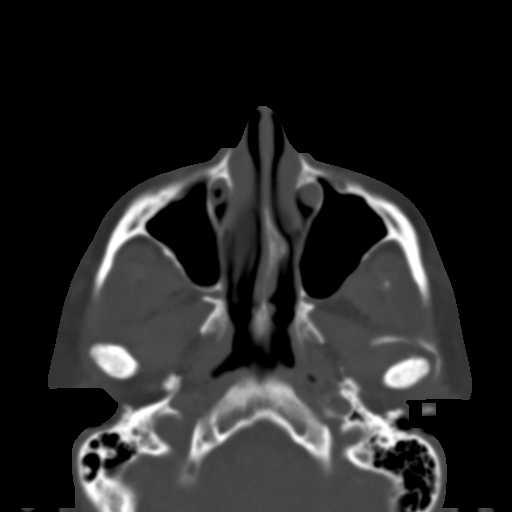
[im 15/34  brain]
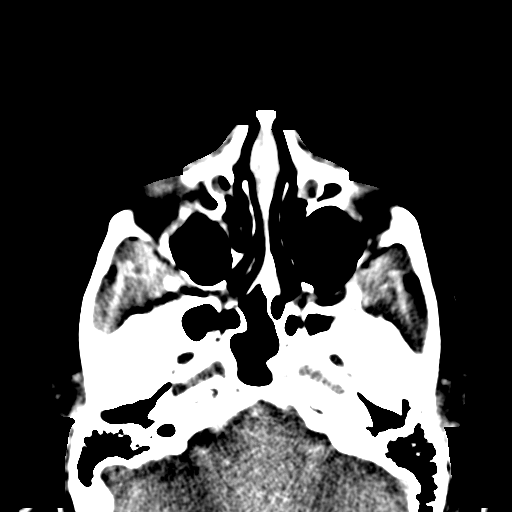
[im 15/34  bone]
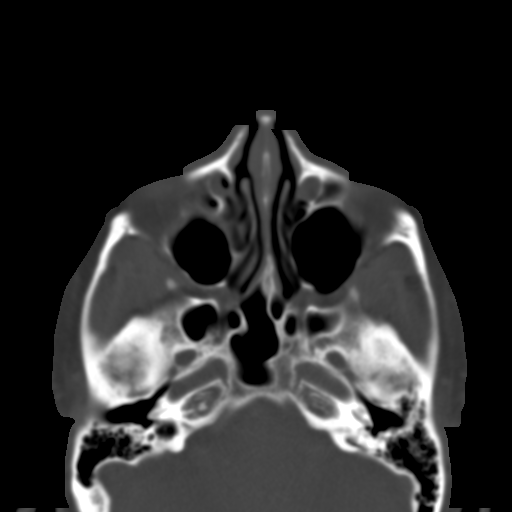
[im 19/34  bone]
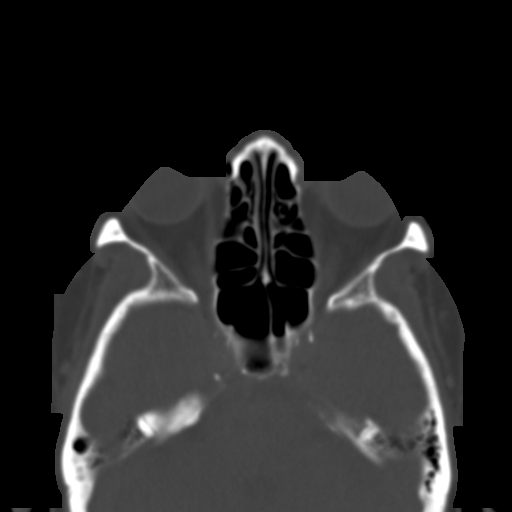
[im 22/34  bone]
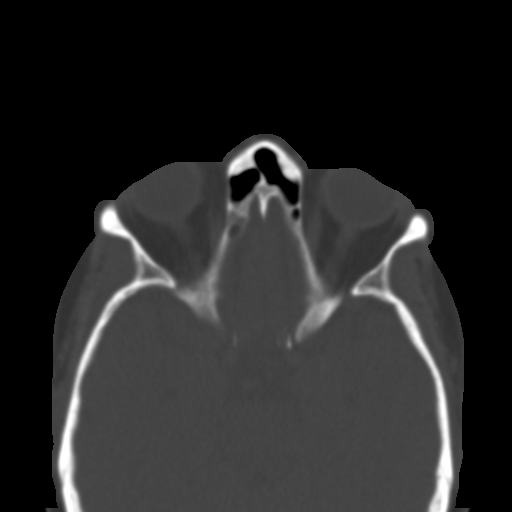
[im 26/34  bone]
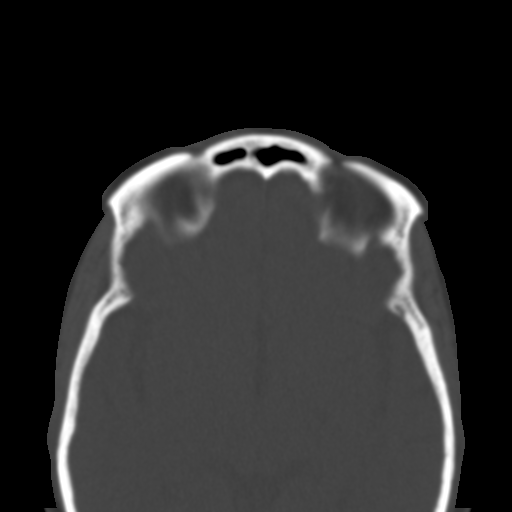
[im 28/34  brain]
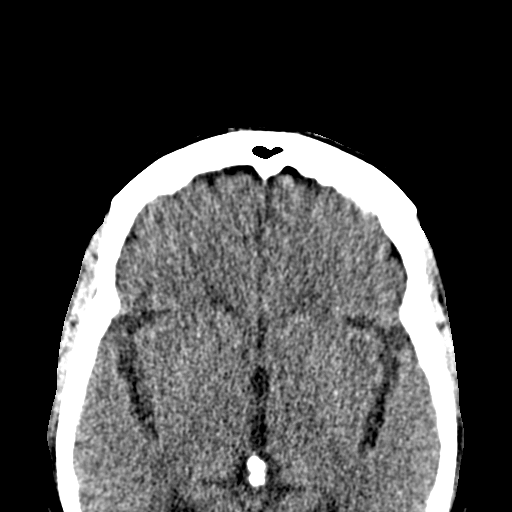
[im 28/34  bone]
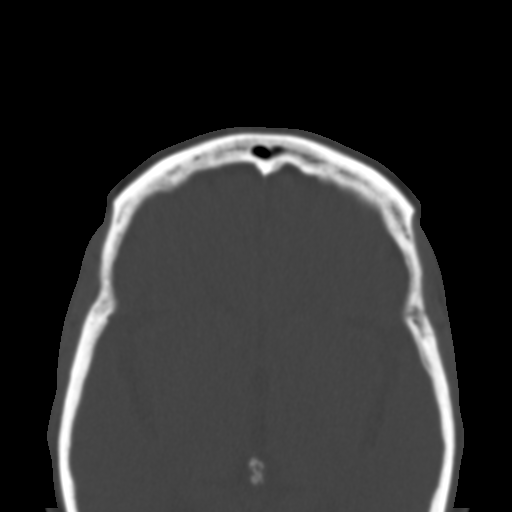
[im 31/34  bone]
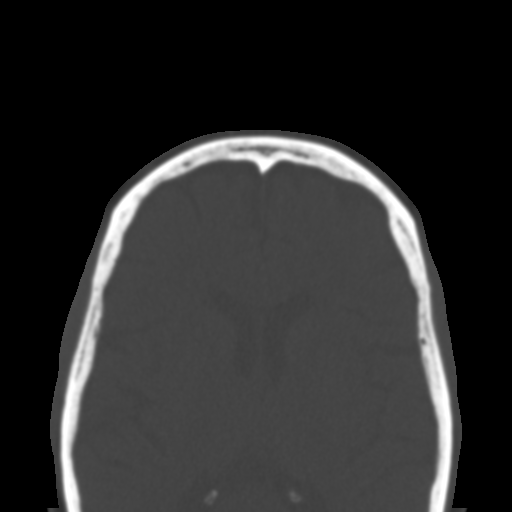

[Series 5: sag bone · sagittal · 0.21mm/px · 1 of 81 slices shown]
[im 41/81  bone]
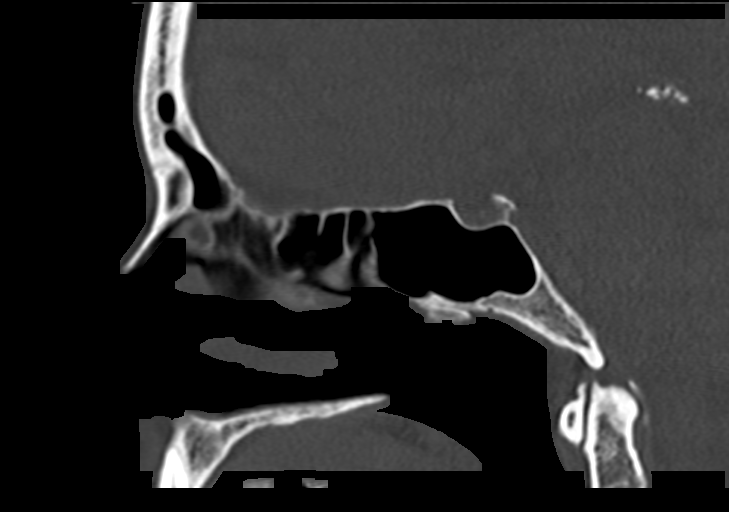

[Series 6: cor soft · coronal · 0.20mm/px · 3 of 72 slices shown]
[im 24/72  bone]
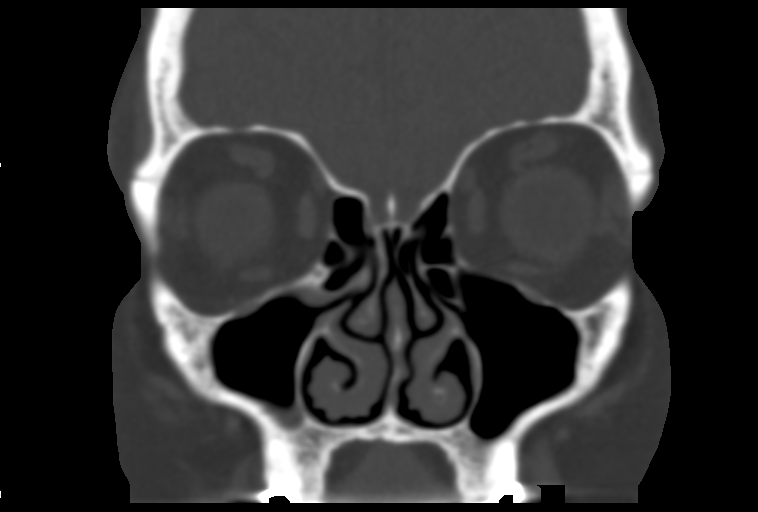
[im 32/72  bone]
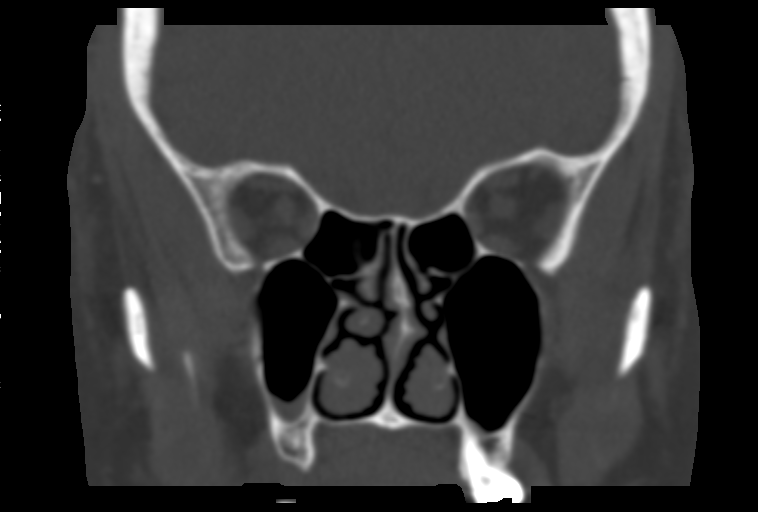
[im 40/72  bone]
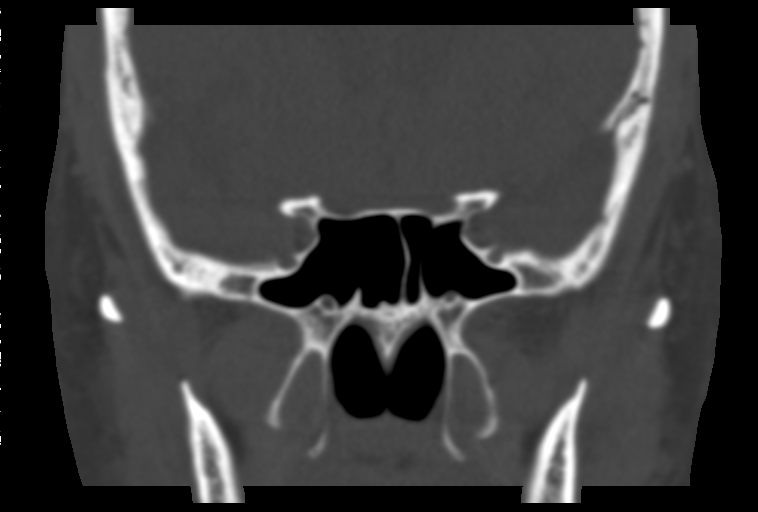

[14 of 47 positions shown; findings below may reference images not displayed]

FINDINGS: Frontal sinuses:  Clear.

Ethmoid sinus air cells:  Clear.  Keros 2 configuration bilaterally.

Sphenoid sinus air cells: Clear. Posterior lateral and lateral wall
partially formed by carotid canal bilateral. Optic nerve courses
along the superior lateral aspect of the sphenoid sinus with bony
cover thin or dehiscent particularly on the right. Aeration extends
into the upper aspect of the pterygoid plates bilaterally.

Maxillary sinuses: Mucosal thickening right maxillary sinus
measuring up to 4.3 mm inferiorly. Mucosal thickening with narrowed
but patent right infundibulum. Left maxillary sinus with minimal
mucosal thickening posterior aspect measuring up to 2.2 mm. Left
infundibulum is patent.

Nasal vault:  Septal spur to left.

Partial opacification anterior superior right mastoid air cells.
Left mastoid air cells are clear. Middle ear cavity clear
bilaterally.

Post lens replacement without acute orbital abnormality.

Intracranial atrophy.

Cavernous segment internal carotid artery calcification.
IMPRESSION: Mucosal thickening right maxillary sinus measuring up to 4.3 mm
inferiorly. Mucosal thickening with narrowed but patent right
infundibulum.

Left maxillary sinus with minimal mucosal thickening posterior
aspect measuring up to 2.2 mm. Left infundibulum is patent.

Remainder of paranasal sinuses are clear.

Partial opacification anterior superior right mastoid air cells.

Important bony landmarks as noted above.

## 2016-03-23 IMAGING — CT CT NECK W/ CM
4 of 5 series · 15 of 33 positions shown, 18 images · IV contrast (iopamidol)
Comparison: None.

CLINICAL DATA: Lymphadenopathy

Creatinine was obtained on site at [HOSPITAL] at [HOSPITAL].Results: Creatinine 0.8 mg/dL.
EXAM:
CT NECK WITH CONTRAST
TECHNIQUE: Multidetector CT imaging of the neck was performed using the
standard protocol following the bolus administration of intravenous
contrast.
CONTRAST:  75mL [3Z] IOPAMIDOL ([3Z]) INJECTION 61%

[Series 2: neck · axial · 0.42mm/px · z∈[-250,-208]mm · 2 of 73 slices shown]
[im 15/73  bone]
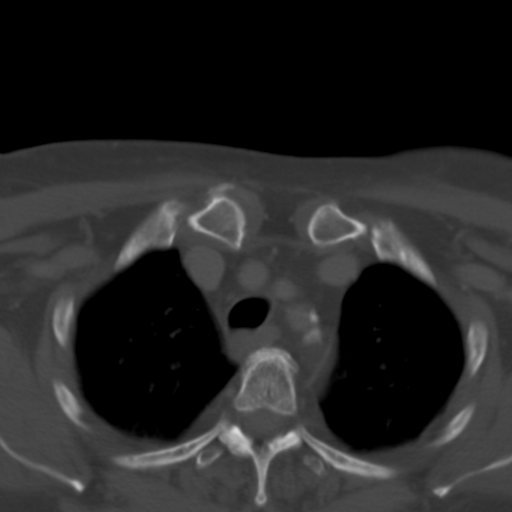
[im 29/73  bone]
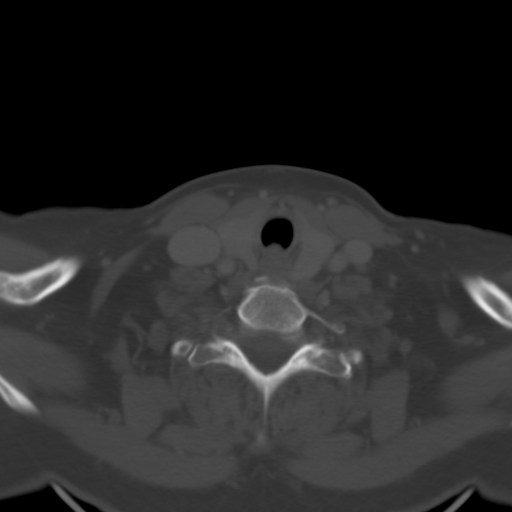

[Series 4: cor neck · coronal · 0.42mm/px · 3 of 70 slices shown]
[im 17/70  bone]
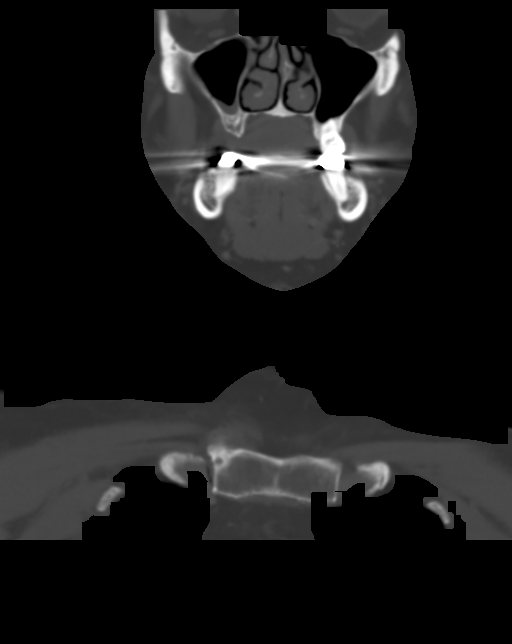
[im 29/70  bone]
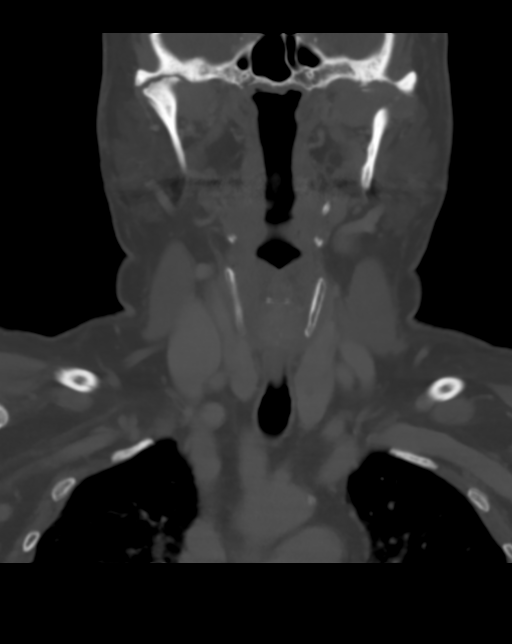
[im 41/70  bone]
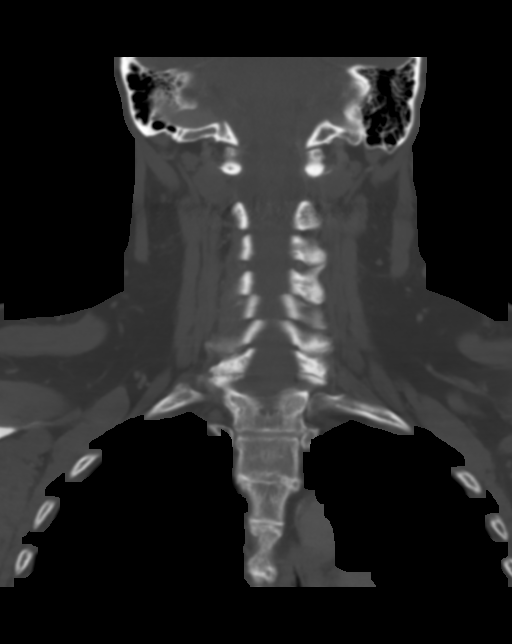

[Series 5: sag neck · sagittal · 0.41mm/px · 5 of 70 slices shown, 6 images]
[im 24/70  bone]
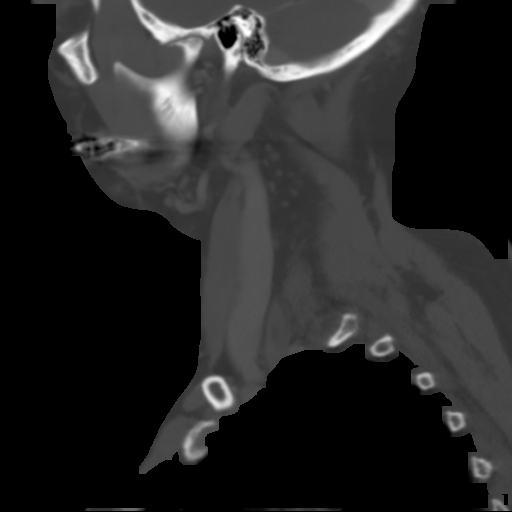
[im 29/70  bone]
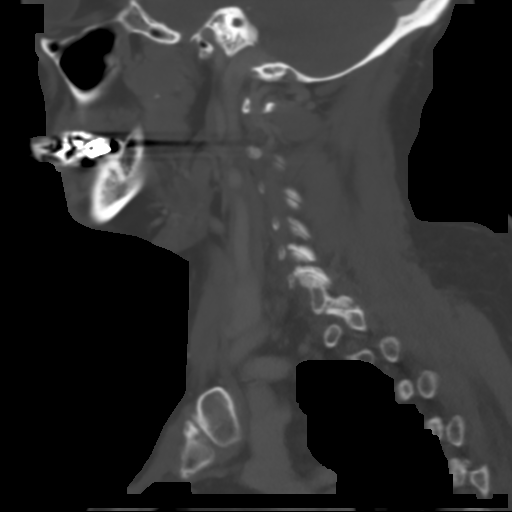
[im 35/70  soft-tissue]
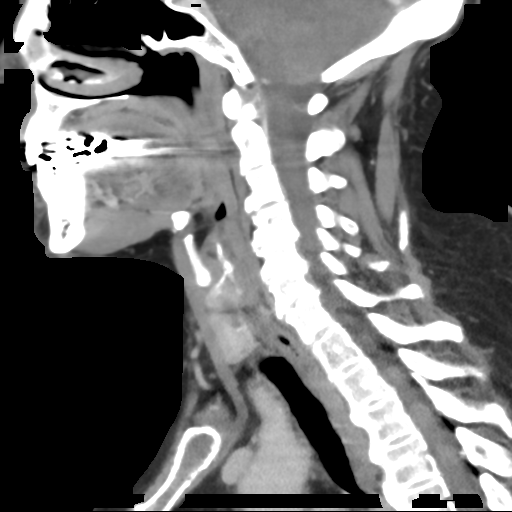
[im 35/70  bone]
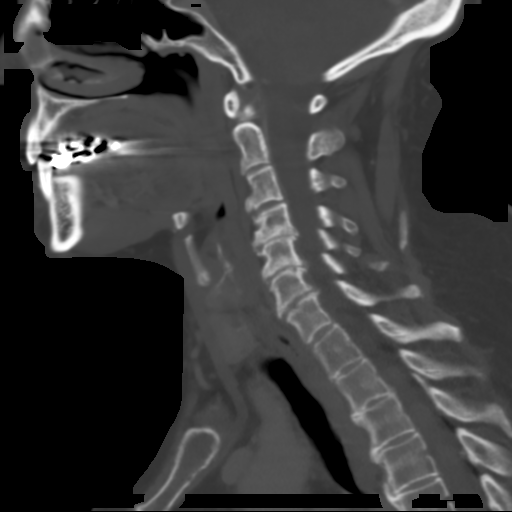
[im 41/70  bone]
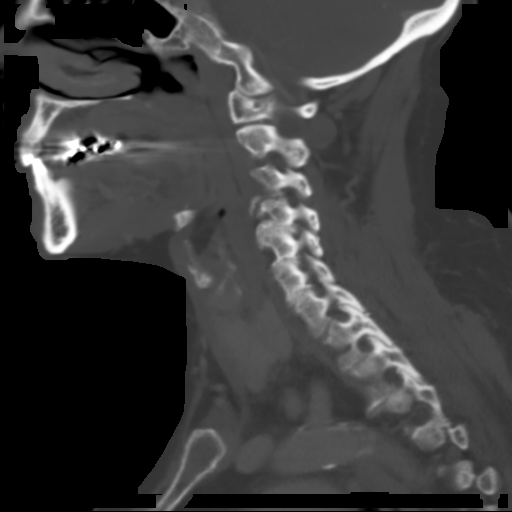
[im 47/70  bone]
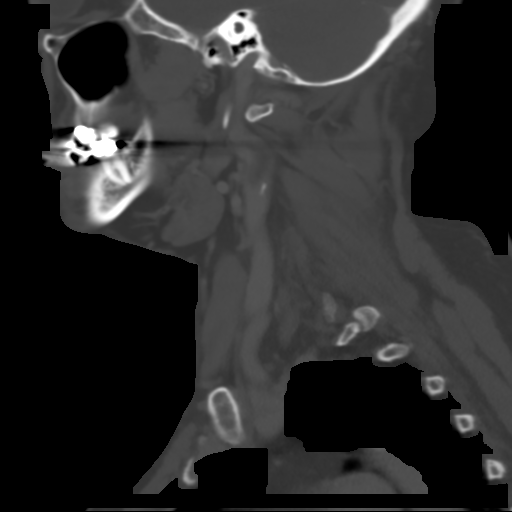

[Series 6: angled axial neck · axial · 0.39mm/px · z∈[-291,-137]mm · 5 of 82 slices shown, 7 images]
[im 14/82  soft-tissue]
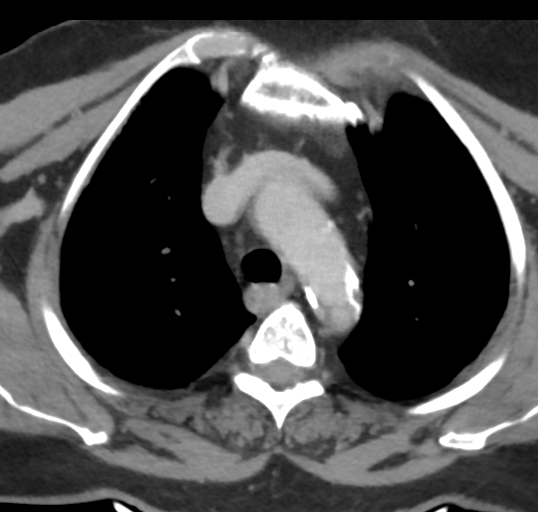
[im 14/82  bone]
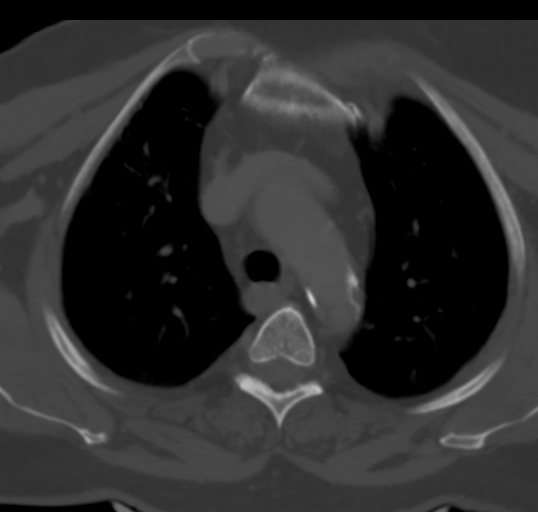
[im 28/82  bone]
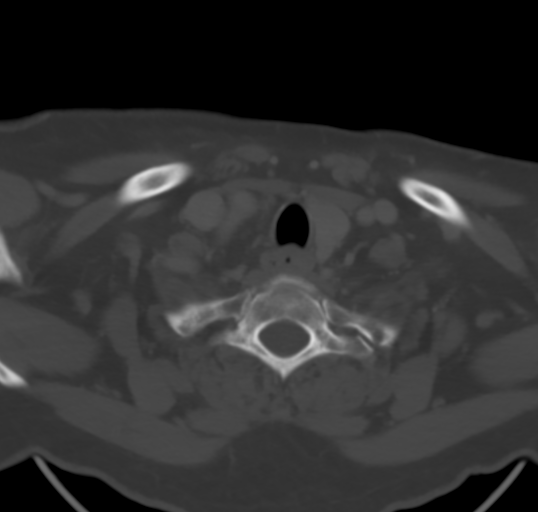
[im 41/82  bone]
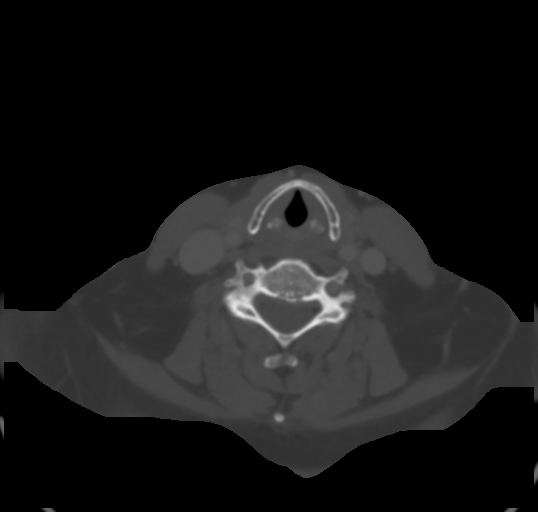
[im 55/82  bone]
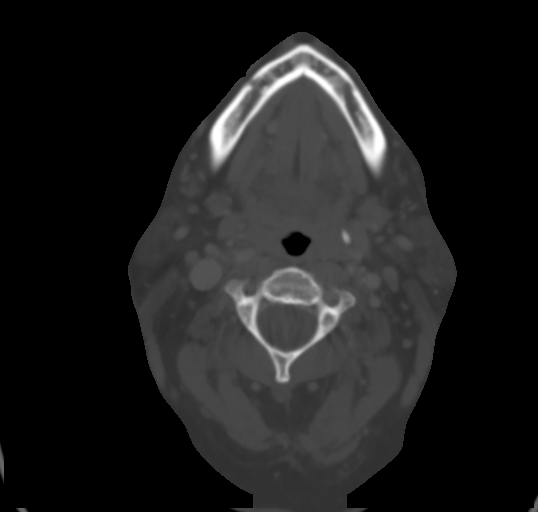
[im 68/82  soft-tissue]
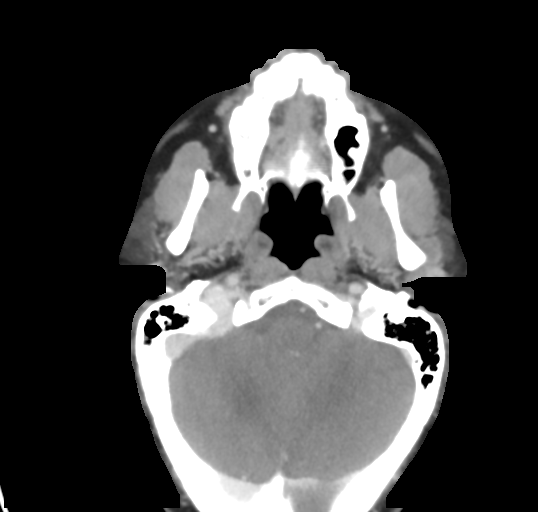
[im 68/82  bone]
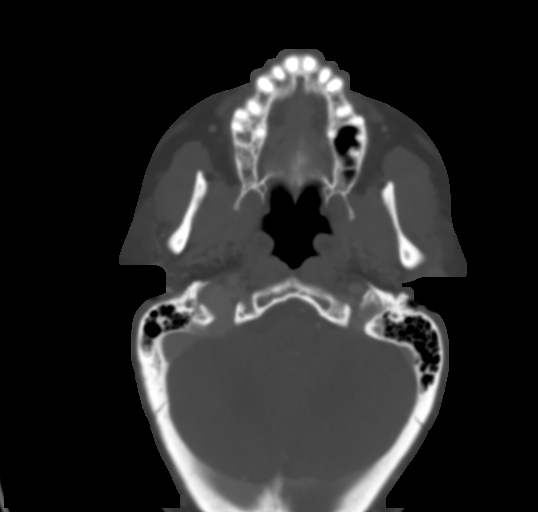

[15 of 33 positions shown; findings below may reference images not displayed]

FINDINGS: Pharynx and larynx: Normal pharynx. Tongue and tonsils normal.
Epiglottis and vocal cords normal.

Salivary glands: Normal submandibular and parotid glands bilaterally

Thyroid: 5 mm left thyroid nodule.  Otherwise negative.

Lymph nodes: No enlarged lymph nodes in the neck.

Vascular: Mild atherosclerotic disease at the carotid bifurcation.
Carotid artery and jugular vein patent bilaterally.

Limited intracranial: Negative

Visualized orbits: Bilateral cataract extraction.  No orbital mass.

Mastoids and visualized paranasal sinuses: Negative

Skeleton: Cervical disc degeneration and spurring. Advanced
degenerative change in the TMJ bilaterally. No fracture and no acute
bony abnormality.

Upper chest: Negative
IMPRESSION: Negative for mass or adenopathy in the neck

5 mm left thyroid nodule

## 2016-03-23 MED ORDER — IOPAMIDOL (ISOVUE-300) INJECTION 61%
75.0000 mL | Freq: Once | INTRAVENOUS | Status: AC | PRN
  Administered 2016-03-23: 75 mL via INTRAVENOUS

## 2016-03-24 ENCOUNTER — Other Ambulatory Visit: Payer: Self-pay | Admitting: *Deleted

## 2016-03-24 ENCOUNTER — Encounter: Payer: Self-pay | Admitting: *Deleted

## 2016-03-24 DIAGNOSIS — C50411 Malignant neoplasm of upper-outer quadrant of right female breast: Secondary | ICD-10-CM

## 2016-03-24 MED ORDER — VENLAFAXINE HCL ER 37.5 MG PO CP24: 37.5000 mg | ORAL_CAPSULE | Freq: Every day | ORAL | 3 refills | Status: DC

## 2016-03-30 ENCOUNTER — Ambulatory Visit (INDEPENDENT_AMBULATORY_CARE_PROVIDER_SITE_OTHER): Payer: Medicare Other | Admitting: Family Medicine

## 2016-03-30 VITALS — BP 122/70 | HR 91 | Temp 97.4°F | Resp 16 | Ht 62.75 in | Wt 150.0 lb

## 2016-03-30 DIAGNOSIS — L309 Dermatitis, unspecified: Secondary | ICD-10-CM

## 2016-03-30 MED ORDER — HYDROXYZINE HCL 25 MG PO TABS: 12.5000 mg | ORAL_TABLET | Freq: Three times a day (TID) | ORAL | 0 refills | Status: DC | PRN

## 2016-03-30 MED ORDER — TRIAMCINOLONE 0.1 % CREAM:EUCERIN CREAM 1:1: 1.0000 "application " | TOPICAL_CREAM | Freq: Three times a day (TID) | CUTANEOUS | 1 refills | Status: DC

## 2016-03-30 MED ORDER — PREDNISONE 20 MG PO TABS: ORAL_TABLET | ORAL | 0 refills | Status: DC

## 2016-03-30 NOTE — Progress Notes (Signed)
Patient ID: Tracey [name], female    DOB: [birth year], 70 y.o.   MRN: [id number]  PCP: [name], MD  Chief Complaint  Patient presents with  . Rash    Subjective:   HPI Presents for evaluation of rash with itching x 1 days in morning.  She initially thought she was bitten by insect on her lower pelvic region while walking her dog early Monday morning.  She began to itch and noticed red large bumps on her lower pelvic region, and right inner thigh.  Today, she notice diffuse closed papules on her abdomen, right hip, and upper chest. Patient describes lesions as severely itchy.  She feels that she possibly came in contact with poison ivy as she was walking the dog in a [place]ed area.  She hasn't taken any medication.    . Social History   Social History  . Marital status: Married    Spouse name: N/A  . Number of children: 2  . Years of education: N/A   Occupational History  . Not on file.   Social History Main Topics  . Smoking status: Former Smoker    Packs/day: 1.00    Years: 20.00    Start date: 06/09/1992  . Smokeless tobacco: Never Used     Comment: quit at age 62  . Alcohol use 3.0 oz/week    5 Glasses of wine per week     Comment: nightly  . Drug use: No     Comment: quit 24 years ago  . Sexual activity: Yes    Birth control/ protection: Surgical   Other Topics Concern  . Not on file   Social History Narrative  . No narrative on file   . Family History  Problem Relation Age of Onset  . Breast cancer Mother     possible inflammatory breast cancer  . Heart attack Father   . Heart disease Father   . ALS Sister   . Breast cancer Maternal Grandmother 78  . Heart attack Maternal Grandfather   . Heart attack Paternal Grandfather   . Breast cancer Maternal Aunt     dx in her 72s  . Breast cancer Other     maternal great grandmother; dx in her 41s    Review of Systems  Constitutional: Negative.   HENT: Negative.   Respiratory: Negative.     Cardiovascular: Negative.   Skin: Positive for color change and rash.       See HPI   Patient Active Problem List   Diagnosis Date Noted  . Hypoxemia 12/23/2015  . Statin myopathy 05/20/2015  . MCI (mild cognitive impairment) 05/20/2015  . Edema 05/17/2015  . 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 Breast cancer genetic susceptibility 04/02/2015  . OSA on CPAP 04/02/2015  . CAD (coronary artery disease), native coronary artery 01/01/2014  . Chest pain 01/01/2014  . Hypertension   . Ventricular tachycardia, non-sustained ([place])   . OSA (obstructive sleep apnea)   . Breast cancer of upper-outer quadrant of right female breast ([place]) 06/28/2013  . Postoperative visit 06/02/2013  . Gout   . Tachycardia      Prior to Admission medications   Medication Sig Start Date End Date Taking? Authorizing Provider  allopurinol (ZYLOPRIM) 100 MG tablet TAKE 1 TABLET BY MOUTH DAILY 09/27/14  Yes Chauncey Cruel, MD  allopurinol (ZYLOPRIM) 100 MG tablet TAKE 1 TABLET BY MOUTH ONCE DAILY 03/23/16  Yes Chauncey Cruel, MD  anastrozole (ARIMIDEX) 1 MG tablet  TAKE 1 TABLET BY MOUTH EVERY DAY 01/02/16  Yes Chauncey Cruel, MD  celecoxib (CELEBREX) 200 MG capsule TK ONE C PO QD 12/16/15  Yes Historical Provider, MD  cholecalciferol 5000 UNITS TABS Take 0.2 tablets (1,000 Units total) by mouth daily. 01/09/15  Yes Chauncey Cruel, MD  colchicine 0.6 MG tablet TAKE 1 TABLET BY MOUTH EVERY DAY OR AS NEEDED 10/22/15  Yes Chauncey Cruel, MD  Cyanocobalamin (VITAMIN B-12 IJ) Inject 1,000 mcg as directed every 30 (thirty) days. Normally 1st of the month   Yes Historical Provider, MD  cyclobenzaprine (FLEXERIL) 10 MG tablet TK 1 T PO Q 8 H PRF MUSCLE SPASMS 04/25/15  Yes Historical Provider, MD  esomeprazole (NEXIUM) 40 MG capsule TAKE ONE CAPSULE BY MOUTH EVERY DAY AT NOON 08/02/15  Yes Sueanne Margarita, MD  fluticasone (FLONASE) 50 MCG/ACT nasal spray Place 1 spray into the nose daily as needed for rhinitis.   01/18/13  Yes Historical Provider, MD  furosemide (LASIX) 40 MG tablet Take 40 mg by mouth daily as needed for fluid.  04/01/13  Yes Historical Provider, MD  LORazepam (ATIVAN) 0.5 MG tablet Take 0.5 mg by mouth at bedtime as needed for anxiety (or sleep).   Yes Historical Provider, MD  menthol-cetylpyridinium (CEPACOL) 3 MG lozenge Take 1 lozenge (3 mg total) by mouth as needed for sore throat. 05/18/14  Yes [name], MD  pravastatin (PRAVACHOL) 40 MG tablet TAKE 1 TABLET BY MOUTH ONCE DAILY 02/13/16  Yes Chauncey Cruel, MD  venlafaxine XR (EFFEXOR-XR) 150 MG 24 hr capsule TAKE 1 CAPSULE BY MOUTH DAILY WITH BREAKFAST 02/13/16  Yes Chauncey Cruel, MD  venlafaxine XR (EFFEXOR-XR) 37.5 MG 24 hr capsule Take 1 capsule (37.5 mg total) by mouth daily. 03/24/16  Yes Chauncey Cruel, MD     Allergies  Allergen Reactions  . Ivp Dye [Iodinated Diagnostic Agents] Hives  . Sulfa Antibiotics Swelling       Objective:  Physical Exam  Constitutional: She is oriented to person, place, and time. She appears well-developed and well-nourished.  HENT:  Head: Normocephalic and atraumatic.  Right Ear: External ear normal.  Left Ear: External ear normal.  Eyes: Pupils are equal, round, and reactive to light.  Neck: Normal range of motion.  Cardiovascular: Normal rate, regular rhythm, normal heart sounds and intact distal pulses.   Pulmonary/Chest: Effort normal and breath sounds normal.  Musculoskeletal: Normal range of motion.  Neurological: She is alert and oriented to person, place, and time.  Skin: Rash noted. There is erythema.  Closed papules on her abdomen, patch containing multiple papules on right hip, pelvic region three distinct small closed papules,  Diffuse red papules on lower abdomen and upper chest immediate below the neck.  Two papules on left lower forearm. Skin temperature warm bilaterally.  No exudate draining from any lesions.  Psychiatric: She has a normal mood and affect. Her  behavior is normal. Judgment and thought content normal.     Vitals:   03/30/16 1530  BP: 122/70  Pulse: 91  Resp: 16  Temp: 97.4 F (36.3 C)   Assessment & Plan:  1. Dermatitis, likely contact. Start Prednisone 20 mg 3 PO QAM x3days, 2 PO QAM x3days, 1 PO QAM  Take hydroxyzine 12.5-25 mg, every 8 hours, as needed. Triamcinolone/Eucerin 1:1 Cream 1 application 3 times daily as needed to affected areas.  Follow-up as needed.  Carroll Sage. Kenton Kingfisher, MSN, FNP-C [facility]

## 2016-03-30 NOTE — Patient Instructions (Addendum)
Take hydroxyzine 12.5-25 mg  as needed for itching every 8 hours as needed.  Start Prednisone 03/31/16 Take as follows:   Take 3 pill in morning x3days, 2 pill morning x3days, 1 pill morning  X3days.  If rash worsens or doesn't improve, return for follow-up.  IF you received an x-ray today, you will receive an invoice from [facility]. Please contact [facility] at [phone] with questions or concerns regarding your invoice.   IF you received labwork today, you will receive an invoice from [employer]. Please contact [facility] at [phone] with questions or concerns regarding your invoice.   Our billing staff will not be able to assist you with questions regarding bills from these companies.  You will be contacted with the lab results as soon as they are available. The fastest way to get your results is to activate your My Chart account. Instructions are located on the last page of this paperwork. If you have not heard from [place] regarding the results in 2 weeks, please contact this office.      Contact Dermatitis Dermatitis is redness, soreness, and swelling (inflammation) of the skin. Contact dermatitis is a reaction to certain substances that touch the skin. You either touched something that irritated your skin, or you have allergies to something you touched.  HOME CARE  Skin Care  Moisturize your skin as needed.  Apply cool compresses to the affected areas.   Try taking a bath with:   Epsom salts. Follow the instructions on the package. You can get these at a pharmacy or grocery store.   Baking soda. Pour a small amount into the bath as told by your doctor.   Colloidal oatmeal. Follow the instructions on the package. You can get this at a pharmacy or grocery store.   Try applying baking soda paste to your skin. Stir water into baking soda until it looks like paste.  Do not scratch your skin.   Bathe less often.  Bathe in  lukewarm water. Avoid using hot water.  Medicines  Take or apply over-the-counter and prescription medicines only as told by your doctor.   If you were prescribed an antibiotic medicine, take or apply your antibiotic as told by your doctor. Do not stop taking the antibiotic even if your condition starts to get better. General Instructions  Keep all follow-up visits as told by your doctor. This is important.   Avoid the substance that caused your reaction. If you do not know what caused it, keep a journal to try to track what caused it. Write down:   What you eat.   What cosmetic products you use.   What you drink.   What you wear in the affected area. This includes jewelry.   If you were given a bandage (dressing), take care of it as told by your doctor. This includes when to change and remove it.  GET HELP IF:   You do not get better with treatment.   Your condition gets worse.   You have signs of infection such as:  Swelling.  Tenderness.  Redness.  Soreness.  Warmth.   You have a fever.   You have new symptoms.  GET HELP RIGHT AWAY IF:   You have a very bad headache.  You have neck pain.  Your neck is stiff.   You throw up (vomit).   You feel very sleepy.   You see red streaks coming from the affected area.   Your bone or joint  underneath the affected area becomes painful after the skin has healed.   The affected area turns darker.   You have trouble breathing.    This information is not intended to replace advice given to you by your health care provider. Make sure you discuss any questions you have with your health care provider.   Document Released: 05/31/2009 Document Revised: 04/24/2015 Document Reviewed: 12/19/2014 Elsevier Interactive Patient Education [employer].

## 2016-04-21 ENCOUNTER — Other Ambulatory Visit: Payer: Self-pay | Admitting: Oncology

## 2016-04-23 DIAGNOSIS — R0982 Postnasal drip: Secondary | ICD-10-CM | POA: Diagnosis not present

## 2016-04-23 DIAGNOSIS — J329 Chronic sinusitis, unspecified: Secondary | ICD-10-CM | POA: Diagnosis not present

## 2016-05-05 ENCOUNTER — Other Ambulatory Visit: Payer: Self-pay | Admitting: Oncology

## 2016-05-18 ENCOUNTER — Other Ambulatory Visit: Payer: Self-pay | Admitting: Oncology

## 2016-06-03 ENCOUNTER — Other Ambulatory Visit: Payer: Self-pay | Admitting: Oncology

## 2016-06-05 DIAGNOSIS — R51 Headache: Secondary | ICD-10-CM | POA: Diagnosis not present

## 2016-06-05 DIAGNOSIS — J329 Chronic sinusitis, unspecified: Secondary | ICD-10-CM | POA: Diagnosis not present

## 2016-06-24 DIAGNOSIS — Z853 Personal history of malignant neoplasm of breast: Secondary | ICD-10-CM | POA: Diagnosis not present

## 2016-06-25 ENCOUNTER — Other Ambulatory Visit: Payer: Self-pay | Admitting: Oncology

## 2016-07-04 ENCOUNTER — Other Ambulatory Visit: Payer: Self-pay | Admitting: Oncology

## 2016-07-06 ENCOUNTER — Other Ambulatory Visit: Payer: Self-pay | Admitting: *Deleted

## 2016-07-06 DIAGNOSIS — C50411 Malignant neoplasm of upper-outer quadrant of right female breast: Secondary | ICD-10-CM

## 2016-07-06 DIAGNOSIS — Z170 Estrogen receptor positive status [ER+]: Secondary | ICD-10-CM

## 2016-07-06 MED ORDER — ANASTROZOLE 1 MG PO TABS: 1.0000 mg | ORAL_TABLET | Freq: Every day | ORAL | 3 refills | Status: DC

## 2016-07-06 MED ORDER — COLCHICINE 0.6 MG PO TABS: ORAL_TABLET | ORAL | 0 refills | Status: DC

## 2016-07-15 DIAGNOSIS — Z1289 Encounter for screening for malignant neoplasm of other sites: Secondary | ICD-10-CM | POA: Diagnosis not present

## 2016-07-22 DIAGNOSIS — L570 Actinic keratosis: Secondary | ICD-10-CM | POA: Diagnosis not present

## 2016-07-22 DIAGNOSIS — R202 Paresthesia of skin: Secondary | ICD-10-CM | POA: Diagnosis not present

## 2016-07-22 DIAGNOSIS — D225 Melanocytic nevi of trunk: Secondary | ICD-10-CM | POA: Diagnosis not present

## 2016-07-22 DIAGNOSIS — L821 Other seborrheic keratosis: Secondary | ICD-10-CM | POA: Diagnosis not present

## 2016-07-22 DIAGNOSIS — Z23 Encounter for immunization: Secondary | ICD-10-CM | POA: Diagnosis not present

## 2016-07-22 DIAGNOSIS — L578 Other skin changes due to chronic exposure to nonionizing radiation: Secondary | ICD-10-CM | POA: Diagnosis not present

## 2016-07-22 DIAGNOSIS — I788 Other diseases of capillaries: Secondary | ICD-10-CM | POA: Diagnosis not present

## 2016-07-22 DIAGNOSIS — D485 Neoplasm of uncertain behavior of skin: Secondary | ICD-10-CM | POA: Diagnosis not present

## 2016-07-28 DIAGNOSIS — G4733 Obstructive sleep apnea (adult) (pediatric): Secondary | ICD-10-CM | POA: Diagnosis not present

## 2016-07-28 DIAGNOSIS — R52 Pain, unspecified: Secondary | ICD-10-CM | POA: Diagnosis not present

## 2016-07-28 DIAGNOSIS — R030 Elevated blood-pressure reading, without diagnosis of hypertension: Secondary | ICD-10-CM | POA: Diagnosis not present

## 2016-07-28 DIAGNOSIS — R42 Dizziness and giddiness: Secondary | ICD-10-CM | POA: Diagnosis not present

## 2016-07-28 DIAGNOSIS — Z743 Need for continuous supervision: Secondary | ICD-10-CM | POA: Diagnosis not present

## 2016-07-28 DIAGNOSIS — Z87891 Personal history of nicotine dependence: Secondary | ICD-10-CM | POA: Diagnosis not present

## 2016-07-28 DIAGNOSIS — R7989 Other specified abnormal findings of blood chemistry: Secondary | ICD-10-CM | POA: Diagnosis not present

## 2016-07-28 DIAGNOSIS — R4589 Other symptoms and signs involving emotional state: Secondary | ICD-10-CM | POA: Diagnosis not present

## 2016-07-28 DIAGNOSIS — R079 Chest pain, unspecified: Secondary | ICD-10-CM | POA: Diagnosis not present

## 2016-07-28 DIAGNOSIS — E785 Hyperlipidemia, unspecified: Secondary | ICD-10-CM | POA: Diagnosis not present

## 2016-07-28 DIAGNOSIS — Z853 Personal history of malignant neoplasm of breast: Secondary | ICD-10-CM | POA: Diagnosis not present

## 2016-07-28 DIAGNOSIS — M109 Gout, unspecified: Secondary | ICD-10-CM | POA: Diagnosis not present

## 2016-07-28 DIAGNOSIS — R197 Diarrhea, unspecified: Secondary | ICD-10-CM | POA: Diagnosis not present

## 2016-07-28 DIAGNOSIS — R072 Precordial pain: Secondary | ICD-10-CM | POA: Diagnosis not present

## 2016-07-29 DIAGNOSIS — R030 Elevated blood-pressure reading, without diagnosis of hypertension: Secondary | ICD-10-CM | POA: Diagnosis not present

## 2016-07-29 DIAGNOSIS — M109 Gout, unspecified: Secondary | ICD-10-CM | POA: Diagnosis not present

## 2016-07-29 DIAGNOSIS — R079 Chest pain, unspecified: Secondary | ICD-10-CM | POA: Diagnosis not present

## 2016-07-29 DIAGNOSIS — Z853 Personal history of malignant neoplasm of breast: Secondary | ICD-10-CM | POA: Diagnosis not present

## 2016-07-29 DIAGNOSIS — E785 Hyperlipidemia, unspecified: Secondary | ICD-10-CM | POA: Diagnosis not present

## 2016-07-29 DIAGNOSIS — R197 Diarrhea, unspecified: Secondary | ICD-10-CM | POA: Diagnosis not present

## 2016-07-29 DIAGNOSIS — G4733 Obstructive sleep apnea (adult) (pediatric): Secondary | ICD-10-CM | POA: Diagnosis not present

## 2016-07-29 DIAGNOSIS — Z87891 Personal history of nicotine dependence: Secondary | ICD-10-CM | POA: Diagnosis not present

## 2016-08-03 DIAGNOSIS — K219 Gastro-esophageal reflux disease without esophagitis: Secondary | ICD-10-CM | POA: Diagnosis not present

## 2016-08-03 DIAGNOSIS — I10 Essential (primary) hypertension: Secondary | ICD-10-CM | POA: Diagnosis not present

## 2016-08-03 DIAGNOSIS — R0789 Other chest pain: Secondary | ICD-10-CM | POA: Diagnosis not present

## 2016-08-24 DIAGNOSIS — I10 Essential (primary) hypertension: Secondary | ICD-10-CM | POA: Diagnosis not present

## 2016-08-24 DIAGNOSIS — J209 Acute bronchitis, unspecified: Secondary | ICD-10-CM | POA: Diagnosis not present

## 2016-08-24 DIAGNOSIS — R05 Cough: Secondary | ICD-10-CM | POA: Diagnosis not present

## 2016-09-18 ENCOUNTER — Other Ambulatory Visit: Payer: Self-pay | Admitting: Oncology

## 2016-09-21 DIAGNOSIS — I10 Essential (primary) hypertension: Secondary | ICD-10-CM | POA: Diagnosis not present

## 2016-09-21 DIAGNOSIS — J018 Other acute sinusitis: Secondary | ICD-10-CM | POA: Diagnosis not present

## 2016-09-21 DIAGNOSIS — J029 Acute pharyngitis, unspecified: Secondary | ICD-10-CM | POA: Diagnosis not present

## 2016-09-21 DIAGNOSIS — H579 Unspecified disorder of eye and adnexa: Secondary | ICD-10-CM | POA: Diagnosis not present

## 2016-09-21 DIAGNOSIS — R509 Fever, unspecified: Secondary | ICD-10-CM | POA: Diagnosis not present

## 2016-09-22 DIAGNOSIS — H43813 Vitreous degeneration, bilateral: Secondary | ICD-10-CM | POA: Diagnosis not present

## 2016-09-22 DIAGNOSIS — G43101 Migraine with aura, not intractable, with status migrainosus: Secondary | ICD-10-CM | POA: Diagnosis not present

## 2016-09-22 DIAGNOSIS — Z961 Presence of intraocular lens: Secondary | ICD-10-CM | POA: Diagnosis not present

## 2016-10-05 DIAGNOSIS — J209 Acute bronchitis, unspecified: Secondary | ICD-10-CM | POA: Diagnosis not present

## 2016-10-05 DIAGNOSIS — J018 Other acute sinusitis: Secondary | ICD-10-CM | POA: Diagnosis not present

## 2016-10-05 DIAGNOSIS — I10 Essential (primary) hypertension: Secondary | ICD-10-CM | POA: Diagnosis not present

## 2016-10-05 DIAGNOSIS — R0602 Shortness of breath: Secondary | ICD-10-CM | POA: Diagnosis not present

## 2016-10-09 DIAGNOSIS — J018 Other acute sinusitis: Secondary | ICD-10-CM | POA: Diagnosis not present

## 2016-10-09 DIAGNOSIS — M2669 Other specified disorders of temporomandibular joint: Secondary | ICD-10-CM | POA: Diagnosis not present

## 2016-10-09 DIAGNOSIS — J342 Deviated nasal septum: Secondary | ICD-10-CM | POA: Diagnosis not present

## 2016-10-14 DIAGNOSIS — D485 Neoplasm of uncertain behavior of skin: Secondary | ICD-10-CM | POA: Diagnosis not present

## 2016-10-14 DIAGNOSIS — L570 Actinic keratosis: Secondary | ICD-10-CM | POA: Diagnosis not present

## 2016-10-14 DIAGNOSIS — D2271 Melanocytic nevi of right lower limb, including hip: Secondary | ICD-10-CM | POA: Diagnosis not present

## 2016-10-14 DIAGNOSIS — L905 Scar conditions and fibrosis of skin: Secondary | ICD-10-CM | POA: Diagnosis not present

## 2016-10-19 DIAGNOSIS — R05 Cough: Secondary | ICD-10-CM | POA: Diagnosis not present

## 2016-10-19 DIAGNOSIS — I10 Essential (primary) hypertension: Secondary | ICD-10-CM | POA: Diagnosis not present

## 2016-10-19 DIAGNOSIS — K219 Gastro-esophageal reflux disease without esophagitis: Secondary | ICD-10-CM | POA: Diagnosis not present

## 2016-10-19 DIAGNOSIS — J018 Other acute sinusitis: Secondary | ICD-10-CM | POA: Diagnosis not present

## 2016-10-30 DIAGNOSIS — R05 Cough: Secondary | ICD-10-CM | POA: Diagnosis not present

## 2016-10-30 DIAGNOSIS — R0602 Shortness of breath: Secondary | ICD-10-CM | POA: Diagnosis not present

## 2016-10-30 DIAGNOSIS — G4733 Obstructive sleep apnea (adult) (pediatric): Secondary | ICD-10-CM | POA: Diagnosis not present

## 2016-11-13 DIAGNOSIS — Z01812 Encounter for preprocedural laboratory examination: Secondary | ICD-10-CM | POA: Diagnosis not present

## 2016-11-13 DIAGNOSIS — R05 Cough: Secondary | ICD-10-CM | POA: Diagnosis not present

## 2016-11-13 DIAGNOSIS — R0602 Shortness of breath: Secondary | ICD-10-CM | POA: Diagnosis not present

## 2016-11-16 DIAGNOSIS — R0602 Shortness of breath: Secondary | ICD-10-CM | POA: Diagnosis not present

## 2016-11-16 DIAGNOSIS — G4733 Obstructive sleep apnea (adult) (pediatric): Secondary | ICD-10-CM | POA: Diagnosis not present

## 2016-11-16 DIAGNOSIS — R05 Cough: Secondary | ICD-10-CM | POA: Diagnosis not present

## 2016-12-09 DIAGNOSIS — R05 Cough: Secondary | ICD-10-CM | POA: Diagnosis not present

## 2016-12-21 ENCOUNTER — Ambulatory Visit (INDEPENDENT_AMBULATORY_CARE_PROVIDER_SITE_OTHER): Payer: Medicare Other | Admitting: Neurology

## 2016-12-21 ENCOUNTER — Encounter: Payer: Self-pay | Admitting: Neurology

## 2016-12-21 VITALS — BP 141/79 | HR 91 | Ht 62.0 in | Wt 151.0 lb

## 2016-12-21 DIAGNOSIS — Z9989 Dependence on other enabling machines and devices: Secondary | ICD-10-CM | POA: Diagnosis not present

## 2016-12-21 DIAGNOSIS — G4733 Obstructive sleep apnea (adult) (pediatric): Secondary | ICD-10-CM

## 2016-12-21 DIAGNOSIS — J441 Chronic obstructive pulmonary disease with (acute) exacerbation: Secondary | ICD-10-CM | POA: Diagnosis not present

## 2016-12-21 DIAGNOSIS — J101 Influenza due to other identified influenza virus with other respiratory manifestations: Secondary | ICD-10-CM | POA: Diagnosis not present

## 2016-12-21 NOTE — Progress Notes (Signed)
CPAP and ONO orders sent to [facility].

## 2016-12-21 NOTE — Patient Instructions (Signed)
Chronic Obstructive Pulmonary Disease Exacerbation  Chronic obstructive pulmonary disease (COPD) is a common lung problem. In COPD, the flow of air from the lungs is limited. COPD exacerbations are times that breathing gets worse and you need extra treatment. Without treatment they can be life threatening. If they happen often, your lungs can become more damaged. If your COPD gets worse, your doctor may treat you with:  ? Medicines.  ? Oxygen.  ? Different ways to clear your airway, such as using a mask.    Follow these instructions at home:  ? Do not smoke.  ? Avoid tobacco smoke and other things that bother your lungs.  ? If given, take your antibiotic medicine as told. Finish the medicine even if you start to feel better.  ? Only take medicines as told by your doctor.  ? Drink enough fluids to keep your pee (urine) clear or pale yellow (unless your doctor has told you not to).  ? Use a cool mist machine (vaporizer).  ? If you use oxygen or a machine that turns liquid medicine into a mist (nebulizer), continue to use them as told.  ? Keep up with shots (vaccinations) as told by your doctor.  ? Exercise regularly.  ? Eat healthy foods.  ? Keep all doctor visits as told.  Get help right away if:  ? You are very short of breath and it gets worse.  ? You have trouble talking.  ? You have bad chest pain.  ? You have blood in your spit (sputum).  ? You have a fever.  ? You keep throwing up (vomiting).  ? You feel weak, or you pass out (faint).  ? You feel confused.  ? You keep getting worse.  This information is not intended to replace advice given to you by your health care provider. Make sure you discuss any questions you have with your health care provider.  Document Released: 07/23/2011 Document Revised: 01/09/2016 Document Reviewed: 04/07/2013  Elsevier Interactive Patient Education ? 2017 [employer]

## 2016-12-21 NOTE — Progress Notes (Signed)
SLEEP MEDICINE CLINIC   Provider:  Larey [name], M D  Referring Provider: [name], MD Primary Care Physician:  Tracey Hatchet, MD  Chief Complaint  Patient presents with  . Follow-up    cpap    HPI:  Tracey [name] is a 71 y.o. female , seen here as a referral from Dr. Ardeth [name] for a transfer of sleep care.  Chief complaint according to patient : " re evaluate my apnea and care."  The patient reports that she moved to the [place] area about 3 years ago and still gets supplies for her established CPAP per Mail. She was diagnosed with obstructive sleep apnea in [place], in [place]. The [facility] posted the sleep center. The patient was diagnosed on 05/02/2008 the study revealed an AHI of 23 was mild decreases in oxygen level as well as continuous snoring. Then CPAP was used for the second night of the split night polysomnography and her oxygen level improved to normal her snoring resolved and the breathing was totally regulated according to Dr. Shelton [name] note. Concerning was that there was a ventricular tachycardia as of 27 beats. The patient was evaluated by cardiology afterwards the finding could never be reciprocated. She continued to use CPAP for the last 7 years compliantly but she is a little tired of using a nasal mask that has become uncomfortable and his pressure marks on her face. She is also not sure to what degree apnea still present at this time. The patient stays about 7 months of the year here in [place], and 5 months in [place]. Usually they travel from Thanksgiving to Easter. Her husband witnesses snoring when she naps, but not while on CPAP. Her insomnia improved drastically after CPAP.   Sleep habits are as follows: The patient goes to bed between 9.30 and 10 PM, usually falls asleep promptly. She prefers the left side to sleep , but wakes sometimes up on her back.  She learned to sleep on her back after breast cancer surgery (  diagnosed Aug 8th 2014 ). The bedroom is described as core, quiet and dark. The patient shares a bedroom with her husband. They're also 2 dogs in the bedroom on the bed. She rises usually once to go to the bathroom sometimes twice. She can fall asleep again fairly promptly. She is not a restless sleeper not kicking or moving or not excessively at night. She rises in the morning at 5 sometimes even 8. She usually wakes spontaneously not based on an alarm setting. She averages 7-8 hours of nocturnal sleep.  She reports vivid dreams, "repetitive and in full color".  Some mornings she feels refreshed and restored but not all the time. She is still recovering from her breast reconstruction surgery. She endorsed some snoring, joint pain, aching muscles and even some dizziness. She also has right shoulder bursitis which limits her ability to sleep on the right. She naps 3 out of 7 days, once  daily, a nap will last  2 hours unless she sets an alarm.   Mrs. Tracey [name] this past medical history includes gout, hyperlipidemia, hypertension, palpitations probably due to nonsustained ventricular tachycardia. In addition breast cancer of which her mother maternal aunt maternal grandmother and maternal great-grandmother were also affected. She is a history of allergic rhinitis her sister died in 2011-11-07 of ALS. Sleep medical history and family sleep history: Her father was diagnosed with OSA never used CPAP ,  had an MI at age 50. Social history: non smoker,  quit at age 50. ETOH , 1-2 at night. Caffeine use : none. Decaffeinated tea and coffee.  Average and regular golf player. She likes winter sports, especially skiing but uses the left dangerous slope these days.  Interval history from 05-20-15. Hearing has been willing to undergo a new sleep study and attended nocturnal polysomnography on 04-09-15. She was diagnosed with a very mild AHI of 5.2 , supine sleep  AHI became 12.4  and it was further evident that the patient did not  get into REM sleep. For this reason I feel it is much more important that the patient stays on CPAP given that her apnea was mild and she was fitted with a new mask. The new mass does not cause pressure marks around her nose and does not entangle her hair. She is using a dream wear interface, which [facility] had to explicitly order for her.  Interval history from 12/23/2015. Tracey [name] has happily returned from [place], I have the pleasure today to see her CPAP download which confirms and 96.7% compliance average user time 6 hours and 39 minutes of CPAP.The device is a C flex set at 8 cm water pressure with a ramp time of 30 minutes. The REM time is a little too long and we will reduce it to 10 minutes. Her husband has made her aware that she still snores when she doesn't use a CPAP. She has become more compliant she endorsed only 2 points today on the geriatric depression store, 7 points on the Epworth sleepiness score and the fatigue severity at 29.  Interval history from 02/26/2016. Mrs. Tracey [name] is here today relaxed after a week vacation. She reports sleeping so much better with the new CPAP machine. Her sleep is deeper but she does not recall having nightmares she has fallen out of bed 1 night it was actually the third night she used the new CPAP. She feels her sleep is less fragmented, more sound, and more refreshing and restorative. Her download revealed 100% compliance over the last 30 days was 97% compliance 4 hours of use, average user time is 7 hours and 41 minutes. This is excellent compliance with CPAP is set at 8 cm water we did  use a 10 minute ramp function. Her snoring has of course been alleviated using CPAP, which pleases her husband. She has a residual AHI of 8.1 but given her decrease and sleepiness/ fatigue I would not want to change the settings.  Interval history from 12/21/2016, Mrs. Tracey [name] has been to winter in [place] and had a additional medication in [place]. She is now  doing well but during her winter stay in [place] she developed shortness of breath and apparently more severe hypoxemia and apnea. She saw a pulmonologist at the location would tested her oxygen levels while walking and also diagnosed her with type a flu. She had been coughing for over 6 weeks when the flu finally converted to bronchitis and then resolved. She remained air hungry and short of breath for much of her winter time. I'm able to state today that she feels back to her baseline. But we need to discuss how we could prevent altitude related apneas which can often be central in nature and if she should use an AutoPap with an automatic pressure window. The patient is a very highly compliant CPAP user her compliance rate was 93.3% with an average user time of 7 hours and 6 minutes. She has some air leaks but not significant once CPAP is set  at 8 cm water pressure with a residual AHI of 17.4. She has a short ramp time. Most of the AHI is related to shallow breathing. Her average central apnea index is 1.1 and obstructive apnea index is 3.3. Our goal is is to have an AHI of below 5. I will ask [facility] to change her to an auto titration from 5-10 cm water. She continues to use a dream wear interface. She would like the non-prong version of the dream wear interface. We will also meet again in late September or October to discuss if she needs additional oxygen at night during her stays at high altitudes.   Review of Systems: Out of a complete 14 system review, the patient complains of only the following symptoms, and all other reviewed systems are negative. Snoring.  Grieving - lost mother and sister to breast cancer  And was diagnosed with breast cancer herself 2 years ago.  Epworth score 10 , Fatigue severity score 31 from 41  , geriatric depression score 2 , her machine is newish. Nasal pillow. No air-leak data.    Social History   Social History  . Marital status: Married    Spouse name: N/A  .  Number of children: 2  . Years of education: N/A   Occupational History  . Not on file.   Social History Main Topics  . Smoking status: Former Smoker    Packs/day: 1.00    Years: 20.00    Start date: 06/09/1992  . Smokeless tobacco: Never Used     Comment: quit at age 9  . Alcohol use 3.0 oz/week    5 Glasses of wine per week     Comment: nightly  . Drug use: No     Comment: quit 24 years ago  . Sexual activity: Yes    Birth control/ protection: Surgical   Other Topics Concern  . Not on file   Social History Narrative  . No narrative on file    Family History  Problem Relation Age of Onset  . Breast cancer Mother     possible inflammatory breast cancer  . Heart attack Father   . Heart disease Father   . ALS Sister   . Breast cancer Maternal Grandmother 50  . Heart attack Maternal Grandfather   . Heart attack Paternal Grandfather   . Breast cancer Maternal Aunt     dx in her 61s  . Breast cancer Other     maternal great grandmother; dx in her 56s    Past Medical History:  Diagnosis Date  . Allergy   . Anxiety    takes Ativan daily prn  . Arthritis   . Breast cancer ([place]) 2014   ER+/PR+/HEr2-,   . Bruises easily   . Colon polyps    2 polyps by report  . Diverticulosis   . GERD (gastroesophageal reflux disease)    occasionally takes Nexium   . Gout    takes Allopurinol daily and Colchicine daily prn;last attack 16yr ago  . Gout   . History of bladder infections    many yrs ago  . History of bronchitis    last time at least 862yrago  . History of stress incontinence   . Hyperlipidemia    takes Pravastatin daily  . Insomnia    takes Ambien nightly prn  . OSA (obstructive sleep apnea)    on CPAP  . Osteoarthritis   . Peripheral edema    takes Furosemide daily prn  .  PONV (postoperative nausea and vomiting)   . Postmenopausal hormone therapy   . Radiation 07/27/13-09/07/13   Right Breast x 31 treatments  . Rhinitis    uses Flonase prn  . Sinus  congestion   . Status post breast reconstruction 10/15/14   Bilateral implant removal and DIEP performed in [place], [place]  . Tachycardia    takes Metoprolol daily  . Ventricular tachycardia, non-sustained ([place])    during sleep study 2009 with normal cardiac workup    Past Surgical History:  Procedure Laterality Date  . ABDOMINAL HYSTERECTOMY    . APPENDECTOMY    . AXILLARY SENTINEL NODE BIOPSY Right 05/23/2013   Procedure: AXILLARY SENTINEL NODE BIOPSY;  Surgeon: [name], MD;  Location: [facility];  Service: General;  Laterality: Right;  nuc med injection 7:00  . bilateral cataract surgery    . BREAST RECONSTRUCTION Bilateral 05/17/2014   W  . BREAST RECONSTRUCTION WITH PLACEMENT OF TISSUE EXPANDER AND FLEX HD (ACELLULAR HYDRATED DERMIS) Bilateral 05/23/2013   Procedure: BILATERAL BREAST RECONSTRUCTION WITH PLACEMENT OF TISSUE EXPANDER AND FLEX HD;  Surgeon: [name], MD;  Location: [facility];  Service: Plastics;  Laterality: Bilateral;  . CARDIAC CATHETERIZATION  1999  . CHOLECYSTECTOMY    . COLONOSCOPY    . COSMETIC SURGERY    . GALLBLADDER SURGERY    . REMOVAL OF BILATERAL TISSUE EXPANDERS WITH PLACEMENT OF BILATERAL BREAST IMPLANTS Bilateral 05/17/2014   Procedure: REMOVAL OF BILATERAL TISSUE EXPANDERS WITH PLACEMENT OF BILATERAL BREAST IMPLANTS FOR RECONSTRUCTION;  Surgeon: [name], MD;  Location: [facility];  Service: Plastics;  Laterality: Bilateral;  . TONSILLECTOMY    . TOTAL MASTECTOMY Bilateral 05/23/2013   WITH RECONSTRUCTION  . TOTAL MASTECTOMY Bilateral 05/23/2013   Procedure: TOTAL MASTECTOMY;  Surgeon: [name], MD;  Location: [facility];  Service: General;  Laterality: Bilateral;  . urinary tightening     d/t urinary incontinence    Current Outpatient Prescriptions  Medication Sig Dispense Refill  . allopurinol (ZYLOPRIM) 100 MG tablet TAKE 1 TABLET BY MOUTH DAILY 90 tablet 0  . allopurinol (ZYLOPRIM) 100 MG tablet TAKE 1 TABLET BY MOUTH ONCE DAILY 90 tablet 0   . anastrozole (ARIMIDEX) 1 MG tablet Take 1 tablet (1 mg total) by mouth daily. 90 tablet 3  . celecoxib (CELEBREX) 200 MG capsule TK ONE C PO QD  3  . cholecalciferol 5000 UNITS TABS Take 0.2 tablets (1,000 Units total) by mouth daily. 100 tablet 3  . colchicine 0.6 MG tablet TAKE 1 TABLET BY MOUTH EVERY DAY OR AS NEEDED 30 tablet 0  . Cyanocobalamin (VITAMIN B-12 IJ) Inject 1,000 mcg as directed every 30 (thirty) days. Normally 1st of the month    . cyclobenzaprine (FLEXERIL) 10 MG tablet TK 1 T PO Q 8 H PRF MUSCLE SPASMS  0  . esomeprazole (NEXIUM) 40 MG capsule TAKE ONE CAPSULE BY MOUTH EVERY DAY AT NOON 14 capsule 0  . fluticasone (FLONASE) 50 MCG/ACT nasal spray Place 1 spray into the nose daily as needed for rhinitis.     . furosemide (LASIX) 40 MG tablet Take 40 mg by mouth daily as needed for fluid.     . hydrOXYzine (ATARAX/VISTARIL) 25 MG tablet Take 0.5-1 tablets (12.5-25 mg total) by mouth every 8 (eight) hours as needed for itching. 30 tablet 0  . LORazepam (ATIVAN) 0.5 MG tablet Take 0.5 mg by mouth at bedtime as needed for anxiety (or sleep).    . menthol-cetylpyridinium (CEPACOL) 3 MG lozenge  Take 1 lozenge (3 mg total) by mouth as needed for sore throat. 100 tablet 12  . pravastatin (PRAVACHOL) 40 MG tablet TAKE 1 TABLET BY MOUTH ONCE DAILY 90 tablet 0  . Triamcinolone Acetonide (TRIAMCINOLONE 0.1 % CREAM : EUCERIN) CREA Apply 1 application topically 3 (three) times daily. 1 each 1  . venlafaxine XR (EFFEXOR-XR) 150 MG 24 hr capsule TAKE 1 CAPSULE BY MOUTH DAILY WITH BREAKFAST 90 capsule 0  . venlafaxine XR (EFFEXOR-XR) 37.5 MG 24 hr capsule Take 1 capsule (37.5 mg total) by mouth daily. 90 capsule 3   No current facility-administered medications for this visit.     Allergies as of 12/21/2016 - Review Complete 12/21/2016  Allergen Reaction Noted  . Ivp dye [iodinated diagnostic agents] Hives 06/09/2012  . Sulfa antibiotics Swelling 06/09/2012    Vitals: BP (!) 141/79    Pulse 91   Ht 5' 2"  (1.575 m)   Wt 151 lb (68.5 kg)   BMI 27.62 kg/m  Last Weight:  Wt Readings from Last 1 Encounters:  12/21/16 151 lb (68.5 kg)   YJE:HUDJ mass index is 27.62 kg/m.     Last Height:   Ht Readings from Last 1 Encounters:  12/21/16 5' 2"  (1.575 m)    Physical exam:  General: The patient is awake, alert and appears not in acute distress. The patient is well groomed. Head: Normocephalic, atraumatic. Neck is supple. Mallampati 4,  neck circumference: 16.25  Nasal airflow unrestricted , TMJ is not  evident. Retrognathia is not seen. She has a crowded lower jar. Cardiovascular:  Regular rate and rhythm , without  murmurs or carotid bruit, and without distended neck veins. Respiratory: Lungs are clear to auscultation. Skin:  Without evidence of edema, or rash Trunk: BMI is 28. The patient's posture is erect.   Neurologic exam : Speech is fluent,  without  dysarthria, dysphonia or aphasia.  Mood and affect are appropriate.  Cranial nerves: Pupils are equal and briskly reactive to light. Extraocular movement are intact. Hearing to finger rub intact. Facial sensation intact to fine touch.Facial motor strength is symmetric and tongue and uvula moves midline. Shoulder shrug was symmetrical. She is very tense over the shoulder area.  The patient was advised of the nature of the diagnosed sleep disorder , the treatment options and risks for general a health and wellness arising from not treating the condition.   I spent more than 25 minutes of face to face time with the patient. Greater than 50% of time was spent in counseling and coordination of care. We have discussed the diagnosis and differential and I answered the patient's questions.   The patient had suffered 1 run of ventricular tachycardia documented and captured in her sleep study. She has not been placed on any medications after she had a cardiology follow-up.She is no longer excessively fatigued or sleepy and when  she uses her machine, she doesn't snore which was confirmed by her husband. She did best with a dream wear nasal pillow, but it still sometimes dislodges at night. She uses a small size and I will investigate if there is an extra small or perhaps a smaller headgear that would help her. The headgear is sized medium I don't think that fits her very well. She has used the dream ware interface.I'm most happy with her CPAP compliance ,the ramp time went from 30 minutes down to 10 minutes.  She does not have trouble sleeping and she does not have trouble tolerating higher pressures so  I will reduce the ramp time further to 5 minutes. She will continue to use CPAP now on Auto 5-10 cm water and will need 02 during her snow birding in [place], [place]  and return in 12 month.    Assessment:  After physical and neurologic examination, review of laboratory studies,  Personal review of imaging studies, reports of other /same  Imaging studies, results of polysomnography/ neurophysiology testing and pre-existing records as far as provided in visit,  My Assessment is that of a patient with oxygen need when travelling to her winter home in [place]. She was diagnosed with OSA in [place] , in 2009.  She snores when she naps and doesn't use CPAP.  I was able to obtain a compliance report during her office visit. This is dated from 04-01-15, and shows that the patient has used the machine 87% of all nights with over 4 hours of continued use. And 90% of all nights.    Dear Dr. Ardeth [name],  Mrs. Defrank has returned from her extensive travels, and during her stay in [place], [place] , she has seen a pulmonologist . She had 6 weeks of coughing and was diagnosed with type a flu, which let to bronchitis superinfection and shortness of breath. She will need oxygen in addition to CPAP, bled into the machine at night.  I will change the setting from 8 cm to an auto-setting for whenever she is at higher altitudes.   I send the order to [facility].      Asencion Partridge Zakyla Tonche MD  12/21/2016   CC: Tracey [name], [place], [place] [address]

## 2017-01-05 ENCOUNTER — Telehealth: Payer: Self-pay

## 2017-01-05 ENCOUNTER — Other Ambulatory Visit: Payer: Self-pay | Admitting: *Deleted

## 2017-01-05 DIAGNOSIS — C50411 Malignant neoplasm of upper-outer quadrant of right female breast: Secondary | ICD-10-CM

## 2017-01-05 NOTE — Telephone Encounter (Signed)
Called and left a message with a new appt due to bmdc

## 2017-01-06 ENCOUNTER — Ambulatory Visit (INDEPENDENT_AMBULATORY_CARE_PROVIDER_SITE_OTHER): Payer: Medicare Other | Admitting: Emergency Medicine

## 2017-01-06 ENCOUNTER — Ambulatory Visit (INDEPENDENT_AMBULATORY_CARE_PROVIDER_SITE_OTHER): Payer: Medicare Other

## 2017-01-06 ENCOUNTER — Encounter: Payer: Self-pay | Admitting: Emergency Medicine

## 2017-01-06 ENCOUNTER — Other Ambulatory Visit (HOSPITAL_BASED_OUTPATIENT_CLINIC_OR_DEPARTMENT_OTHER): Payer: Medicare Other

## 2017-01-06 VITALS — BP 148/72 | HR 73 | Temp 97.6°F | Resp 16 | Ht 62.0 in | Wt 148.0 lb

## 2017-01-06 DIAGNOSIS — Z23 Encounter for immunization: Secondary | ICD-10-CM | POA: Diagnosis not present

## 2017-01-06 DIAGNOSIS — S8011XA Contusion of right lower leg, initial encounter: Secondary | ICD-10-CM | POA: Insufficient documentation

## 2017-01-06 DIAGNOSIS — S80811A Abrasion, right lower leg, initial encounter: Secondary | ICD-10-CM

## 2017-01-06 DIAGNOSIS — C50411 Malignant neoplasm of upper-outer quadrant of right female breast: Secondary | ICD-10-CM

## 2017-01-06 DIAGNOSIS — S8991XA Unspecified injury of right lower leg, initial encounter: Secondary | ICD-10-CM

## 2017-01-06 DIAGNOSIS — M79604 Pain in right leg: Secondary | ICD-10-CM | POA: Diagnosis not present

## 2017-01-06 LAB — CBC WITH DIFFERENTIAL/PLATELET
BASO%: 0.2 % (ref 0.0–2.0)
Basophils Absolute: 0 10*3/uL (ref 0.0–0.1)
EOS ABS: 0.1 10*3/uL (ref 0.0–0.5)
EOS%: 2.1 % (ref 0.0–7.0)
HCT: 41.4 % (ref 34.8–46.6)
HEMOGLOBIN: 13.5 g/dL (ref 11.6–15.9)
LYMPH%: 32.1 % (ref 14.0–49.7)
MCH: 30.5 pg (ref 25.1–34.0)
MCHC: 32.6 g/dL (ref 31.5–36.0)
MCV: 93.7 fL (ref 79.5–101.0)
MONO#: 0.4 10*3/uL (ref 0.1–0.9)
MONO%: 7.7 % (ref 0.0–14.0)
NEUT%: 57.9 % (ref 38.4–76.8)
NEUTROS ABS: 3.1 10*3/uL (ref 1.5–6.5)
PLATELETS: 162 10*3/uL (ref 145–400)
RBC: 4.42 10*6/uL (ref 3.70–5.45)
RDW: 14 % (ref 11.2–14.5)
WBC: 5.3 10*3/uL (ref 3.9–10.3)
lymph#: 1.7 10*3/uL (ref 0.9–3.3)

## 2017-01-06 LAB — COMPREHENSIVE METABOLIC PANEL
ALT: 40 U/L (ref 0–55)
AST: 27 U/L (ref 5–34)
Albumin: 4.3 g/dL (ref 3.5–5.0)
Alkaline Phosphatase: 85 U/L (ref 40–150)
Anion Gap: 11 mEq/L (ref 3–11)
BILIRUBIN TOTAL: 0.47 mg/dL (ref 0.20–1.20)
BUN: 23.3 mg/dL (ref 7.0–26.0)
CO2: 23 meq/L (ref 22–29)
CREATININE: 0.9 mg/dL (ref 0.6–1.1)
Calcium: 10 mg/dL (ref 8.4–10.4)
Chloride: 107 mEq/L (ref 98–109)
EGFR: 66 mL/min/{1.73_m2} — AB (ref >90)
GLUCOSE: 84 mg/dL (ref 70–140)
Potassium: 4.4 mEq/L (ref 3.5–5.1)
SODIUM: 141 meq/L (ref 136–145)
TOTAL PROTEIN: 7.2 g/dL (ref 6.4–8.3)

## 2017-01-06 IMAGING — DX DG TIBIA/FIBULA 2V*R*
2 series · 2 of 2 positions shown · non-contrast
Comparison: None.

CLINICAL DATA: Right leg pain, initial encounter

EXAM:
RIGHT TIBIA AND FIBULA - 2 VIEW

[tibia ap]
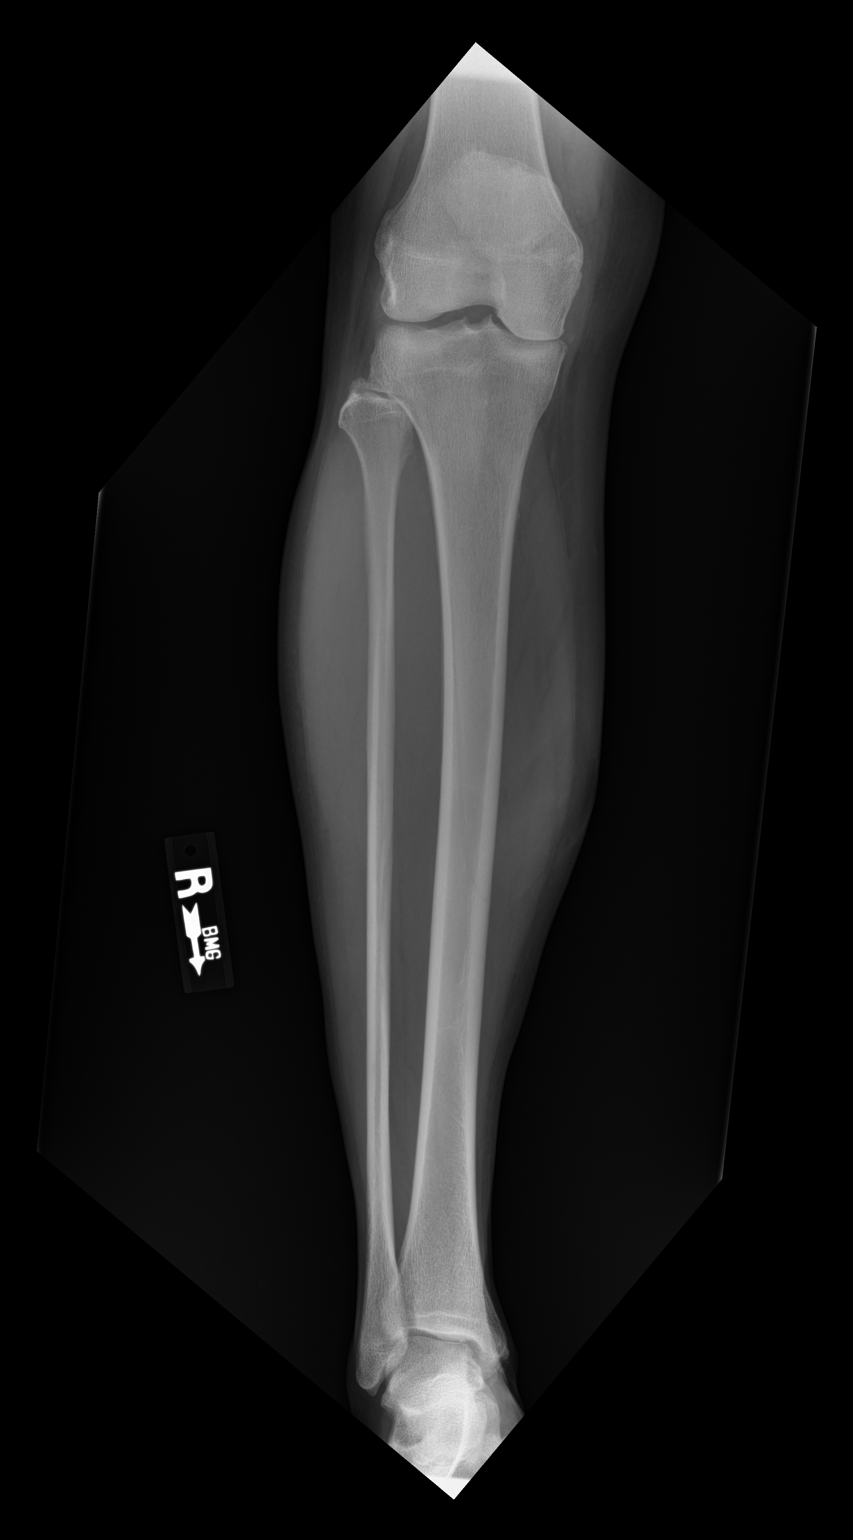

[tibia lat]
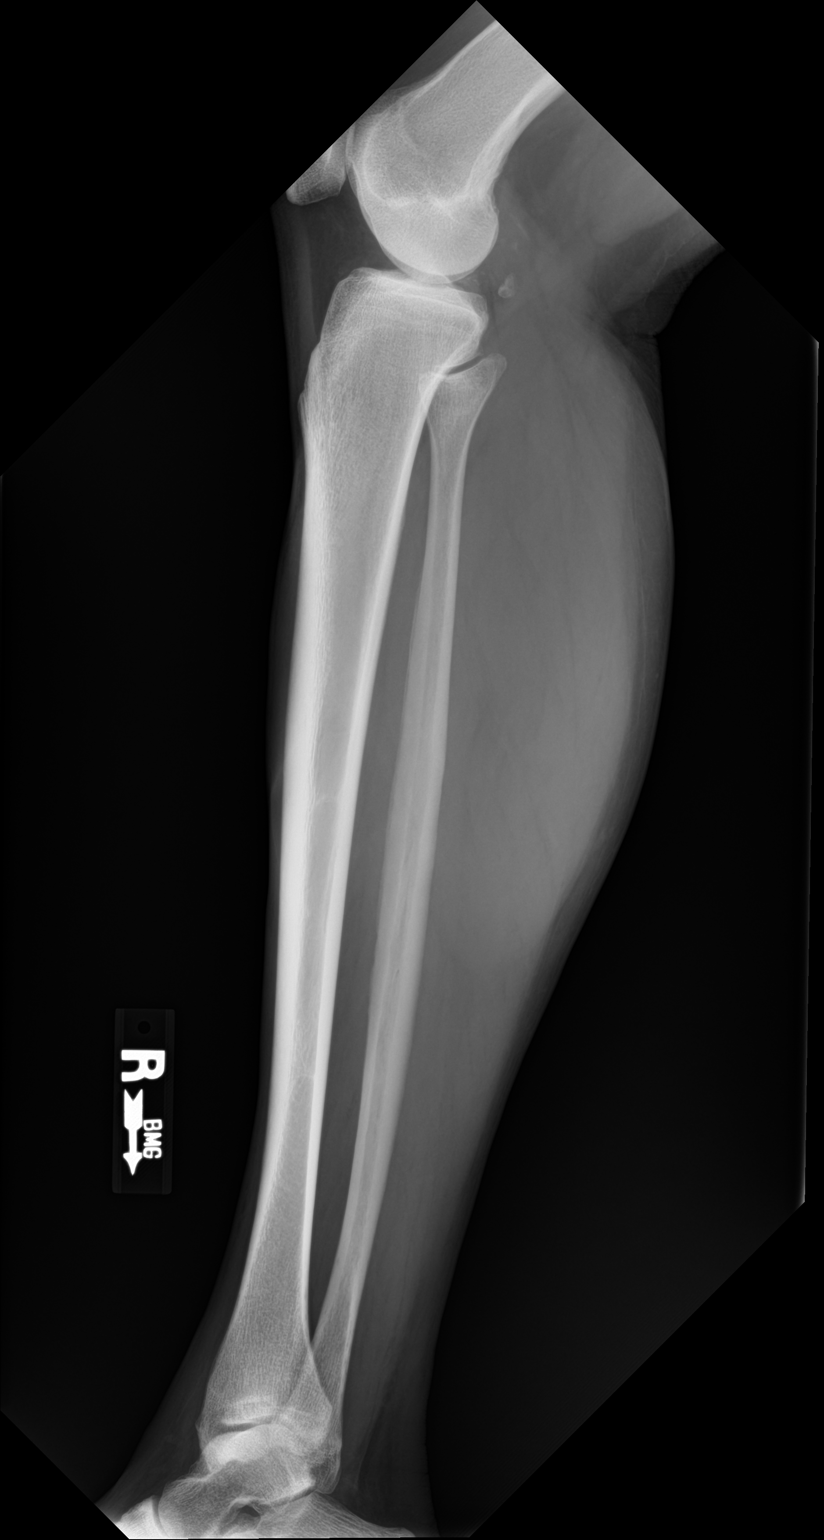

[2 of 2 positions shown; findings below may reference images not displayed]

FINDINGS: There is no evidence of fracture or other focal bone lesions. Soft
tissues are unremarkable.
IMPRESSION: No acute abnormality noted.

## 2017-01-06 NOTE — Patient Instructions (Addendum)
   IF you received an x-ray today, you will receive an invoice from [facility]. Please contact [facility] at [phone] with questions or concerns regarding your invoice.   IF you received labwork today, you will receive an invoice from [facility]. Please contact [facility] at [phone] with questions or concerns regarding your invoice.   Our billing staff will not be able to assist you with questions regarding bills from these companies.  You will be contacted with the lab results as soon as they are available. The fastest way to get your results is to activate your My Chart account. Instructions are located on the last page of this paperwork. If you have not heard from us regarding the results in 2 weeks, please contact this office.     Wound Care, Adult Taking care of your wound properly can help to prevent pain and infection. It can also help your wound to heal more quickly. How is this treated? Wound care  Follow instructions from your health care provider about how to take care of your wound. Make sure you: ? Wash your hands with soap and water before you change the bandage (dressing). If soap and water are not available, use hand sanitizer. ? Change your dressing as told by your health care provider. ? Leave stitches (sutures), skin glue, or adhesive strips in place. These skin closures may need to stay in place for 2 weeks or longer. If adhesive strip edges start to loosen and curl up, you may trim the loose edges. Do not remove adhesive strips completely unless your health care provider tells you to do that.  Check your wound area every day for signs of infection. Check for: ? More redness, swelling, or pain. ? More fluid or blood. ? Warmth. ? Pus or a bad smell.  Ask your health care provider if you should clean the wound with mild soap and water. Doing this may include: ? Using a clean towel to pat the wound dry after cleaning it. Do not rub or scrub  the wound. ? Applying a cream or ointment. Do this only as told by your health care provider. ? Covering the incision with a clean dressing.  Ask your health care provider when you can leave the wound uncovered. Medicines   If you were prescribed an antibiotic medicine, cream, or ointment, take or use the antibiotic as told by your health care provider. Do not stop taking or using the antibiotic even if your condition improves.  Take over-the-counter and prescription medicines only as told by your health care provider. If you were prescribed pain medicine, take it at least 30 minutes before doing any wound care or as told by your health care provider. General instructions  Return to your normal activities as told by your health care provider. Ask your health care provider what activities are safe.  Do not scratch or pick at the wound.  Keep all follow-up visits as told by your health care provider. This is important.  Eat a diet that includes protein, vitamin A, vitamin C, and other nutrient-rich foods. These help the wound heal: ? Protein-rich foods include meat, dairy, beans, nuts, and other sources. ? Vitamin A-rich foods include carrots and dark green, leafy vegetables. ? Vitamin C-rich foods include citrus, tomatoes, and other fruits and vegetables. ? Nutrient-rich foods have protein, carbohydrates, fat, vitamins, or minerals. Eat a variety of healthy foods including vegetables, fruits, and whole grains. Contact a health care provider if:  You received a   You received a tetanus shot and you have swelling, severe pain, redness, or bleeding at the injection site.  Your pain is not controlled with medicine.  You have more redness, swelling, or pain around the wound.  You have more fluid or blood coming from the wound.  Your wound feels warm to the touch.  You have pus or a bad smell coming from the wound.  You have a fever or chills.  You are nauseous or you vomit.  You are dizzy. Get  help right away if:  You have a red streak going away from your wound.  The edges of the wound open up and separate.  Your wound is bleeding and the bleeding does not stop with gentle pressure.  You have a rash.  You faint.  You have trouble breathing. This information is not intended to replace advice given to you by your health care provider. Make sure you discuss any questions you have with your health care provider. Document Released: 05/12/2008 Document Revised: 04/01/2016 Document Reviewed: 02/18/2016 Elsevier Interactive Patient Education  2017 [place] A contusion is a deep bruise. Contusions happen when an injury causes bleeding under the skin. Symptoms of bruising include pain, swelling, and discolored skin. The skin may turn blue, purple, or yellow. Follow these instructions at home:  Rest the injured area.  If told, put ice on the injured area.  Put ice in a plastic bag.  Place a towel between your skin and the bag.  Leave the ice on for 20 minutes, 2-3 times per day.  If told, put light pressure (compression) on the injured area using an elastic bandage. Make sure the bandage is not too tight. Remove it and put it back on as told by your doctor.  If possible, raise (elevate) the injured area above the level of your heart while you are sitting or lying down.  Take over-the-counter and prescription medicines only as told by your doctor. Contact a doctor if:  Your symptoms do not get better after several days of treatment.  Your symptoms get worse.  You have trouble moving the injured area. Get help right away if:  You have very bad pain.  You have a loss of feeling (numbness) in a hand or foot.  Your hand or foot turns pale or cold. This information is not intended to replace advice given to you by your health care provider. Make sure you discuss any questions you have with your health care provider. Document Released: 01/20/2008 Document  Revised: 01/09/2016 Document Reviewed: 12/19/2014 Elsevier Interactive Patient Education  2017 [employer].

## 2017-01-06 NOTE — Progress Notes (Signed)
Tracey [name] 71 y.o.   Chief Complaint  Patient presents with  . Leg Injury    tripped and fell in pool right leg hurts and is cut x1day     HISTORY OF PRESENT ILLNESS: This is a 71 y.o. female complaining of right leg injury sustained yesterday; sustained abrasion to right inner mid lower leg. HPI   Prior to Admission medications   Medication Sig Start Date End Date Taking? Authorizing Provider  allopurinol (ZYLOPRIM) 100 MG tablet TAKE 1 TABLET BY MOUTH DAILY 09/27/14  Yes Magrinat, Virgie Dad, MD  allopurinol (ZYLOPRIM) 100 MG tablet TAKE 1 TABLET BY MOUTH ONCE DAILY 06/25/16  Yes Magrinat, Virgie Dad, MD  anastrozole (ARIMIDEX) 1 MG tablet Take 1 tablet (1 mg total) by mouth daily. 07/06/16  Yes Magrinat, Virgie Dad, MD  celecoxib (CELEBREX) 200 MG capsule TK ONE C PO QD 12/16/15  Yes [provider]  cholecalciferol 5000 UNITS TABS Take 0.2 tablets (1,000 Units total) by mouth daily. 01/09/15  Yes Magrinat, Virgie Dad, MD  colchicine 0.6 MG tablet TAKE 1 TABLET BY MOUTH EVERY DAY OR AS NEEDED 07/06/16  Yes Magrinat, Virgie Dad, MD  Cyanocobalamin (VITAMIN B-12 IJ) Inject 1,000 mcg as directed every 30 (thirty) days. Normally 1st of the month   Yes [provider]  cyclobenzaprine (FLEXERIL) 10 MG tablet TK 1 T PO Q 8 H PRF MUSCLE SPASMS 04/25/15  Yes [provider]  esomeprazole (NEXIUM) 40 MG capsule TAKE ONE CAPSULE BY MOUTH EVERY DAY AT NOON 08/02/15  Yes Turner, Eber Hong, MD  fluticasone (FLONASE) 50 MCG/ACT nasal spray Place 1 spray into the nose daily as needed for rhinitis.  01/18/13  Yes [provider]  furosemide (LASIX) 40 MG tablet Take 40 mg by mouth daily as needed for fluid.  04/01/13  Yes [provider]  hydrOXYzine (ATARAX/VISTARIL) 25 MG tablet Take 0.5-1 tablets (12.5-25 mg total) by mouth every 8 (eight) hours as needed for itching. 03/30/16  Yes Scot Jun, FNP  LORazepam (ATIVAN) 0.5 MG tablet Take 0.5 mg by mouth at bedtime  as needed for anxiety (or sleep).   Yes [provider]  menthol-cetylpyridinium (CEPACOL) 3 MG lozenge Take 1 lozenge (3 mg total) by mouth as needed for sore throat. 05/18/14  Yes [name], MD  pravastatin (PRAVACHOL) 40 MG tablet TAKE 1 TABLET BY MOUTH ONCE DAILY 05/19/16  Yes Magrinat, Virgie Dad, MD  Triamcinolone Acetonide (TRIAMCINOLONE 0.1 % CREAM : EUCERIN) CREA Apply 1 application topically 3 (three) times daily. 03/30/16  Yes Scot Jun, FNP  venlafaxine XR (EFFEXOR-XR) 150 MG 24 hr capsule TAKE 1 CAPSULE BY MOUTH DAILY WITH BREAKFAST 09/22/16  Yes Magrinat, Virgie Dad, MD  venlafaxine XR (EFFEXOR-XR) 37.5 MG 24 hr capsule Take 1 capsule (37.5 mg total) by mouth daily. 03/24/16  Yes Magrinat, Virgie Dad, MD    Allergies  Allergen Reactions  . Ivp Dye [Iodinated Diagnostic Agents] Hives  . Sulfa Antibiotics Swelling    Patient Active Problem List   Diagnosis Date Noted  . Hypoxemia 12/23/2015  . Statin myopathy 05/20/2015  . MCI (mild cognitive impairment) 05/20/2015  . Edema 05/17/2015  . 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 Breast cancer genetic susceptibility 04/02/2015  . OSA on CPAP 04/02/2015  . CAD (coronary artery disease), native coronary artery 01/01/2014  . Chest pain 01/01/2014  . Hypertension   . Ventricular tachycardia, non-sustained ([place])   . OSA (obstructive sleep apnea)   . Breast cancer  of upper-outer quadrant of right female breast ([place]) 06/28/2013  . Postoperative visit 06/02/2013  . Gout   . Tachycardia     Past Medical History:  Diagnosis Date  . Allergy   . Anxiety    takes Ativan daily prn  . Arthritis   . Breast cancer ([place]) 2014   ER+/PR+/HEr2-,   . Bruises easily   . Colon polyps    2 polyps by report  . Diverticulosis   . GERD (gastroesophageal reflux disease)    occasionally takes Nexium   . Gout    takes Allopurinol daily and Colchicine daily prn;last attack 75yr ago  . Gout   . History of bladder infections     many yrs ago  . History of bronchitis    last time at least 89yrago  . History of stress incontinence   . Hyperlipidemia    takes Pravastatin daily  . Insomnia    takes Ambien nightly prn  . OSA (obstructive sleep apnea)    on CPAP  . Osteoarthritis   . Peripheral edema    takes Furosemide daily prn  . PONV (postoperative nausea and vomiting)   . Postmenopausal hormone therapy   . Radiation 07/27/13-09/07/13   Right Breast x 31 treatments  . Rhinitis    uses Flonase prn  . Sinus congestion   . Status post breast reconstruction 10/15/14   [place]  . Tachycardia    takes Metoprolol daily  . Ventricular tachycardia, non-sustained ([facility]   during sleep study 2009 with normal cardiac workup    Past Surgical History:  Procedure Laterality Date  . ABDOMINAL HYSTERECTOMY    . APPENDECTOMY    . AXILLARY SENTINEL NODE BIOPSY Right 05/23/2013   Procedure: AXILLARY SENTINEL NODE BIOPSY;  Surgeon: ToOdis HollingsheadMD;  [facility];  Laterality: Right;  nuc med injection 7:00  . bilateral cataract surgery    . BREAST RECONSTRUCTION Bilateral 05/17/2014   W  . BREAST RECONSTRUCTION WITH PLACEMENT OF TISSUE EXPANDER AND FLEX HD (ACELLULAR HYDRATED DERMIS) Bilateral 05/23/2013   Procedure: BILATERAL BREAST RECONSTRUCTION WITH PLACEMENT OF TISSUE EXPANDER AND FLEX HD;  Surgeon: DaCrissie ReeseMD;  [facility];  Laterality: Bilateral;  . CARDIAC CATHETERIZATION  1999  . CHOLECYSTECTOMY    . COLONOSCOPY    . COSMETIC SURGERY    . GALLBLADDER SURGERY    . REMOVAL OF BILATERAL TISSUE EXPANDERS WITH PLACEMENT OF BILATERAL BREAST IMPLANTS Bilateral 05/17/2014   Procedure: REMOVAL OF BILATERAL TISSUE EXPANDERS WITH PLACEMENT OF BILATERAL BREAST IMPLANTS FOR RECONSTRUCTION;  Surgeon: DaCrissie ReeseMD;  [facility] Service: [facility];  Laterality: Bilateral;  . TONSILLECTOMY    . TOTAL MASTECTOMY  Bilateral 05/23/2013   WITH RECONSTRUCTION  . TOTAL MASTECTOMY Bilateral 05/23/2013   Procedure: TOTAL MASTECTOMY;  Surgeon: ToOdis HollingsheadMD;  [facility] Service: General;  Laterality: Bilateral;  . urinary tightening     d/t urinary incontinence    Social History   Social History  . Marital status: Married    Spouse name: N/A  . Number of children: 2  . Years of education: N/A   Occupational History  . Not on file.   Social History Main Topics  . Smoking status: Former Smoker    Packs/day: 1.00    Years: 20.00    Start date: 06/09/1992  . Smokeless tobacco: Never Used  Comment: quit at age 4  . Alcohol use 3.0 oz/week    5 Glasses of wine per week     Comment: nightly  . Drug use: No     Comment: quit 24 years ago  . Sexual activity: Yes    Birth control/ protection: Surgical   Other Topics Concern  . Not on file   Social History Narrative  . No narrative on file    Family History  Problem Relation Age of Onset  . Breast cancer Mother        possible inflammatory breast cancer  . Heart attack Father   . Heart disease Father   . ALS Sister   . Breast cancer Maternal Grandmother 82  . Heart attack Maternal Grandfather   . Heart attack Paternal Grandfather   . Breast cancer Maternal Aunt        dx in her 47s  . Breast cancer Other        maternal great grandmother; dx in her 14s     Review of Systems  Constitutional: Negative.  Negative for chills and fever.  HENT: Negative.   Eyes: Negative.   Respiratory: Negative for cough and shortness of breath.   Cardiovascular: Negative.   Gastrointestinal: Negative for abdominal pain, diarrhea, nausea and vomiting.  Musculoskeletal:       Right lower leg pain  Skin: Negative for rash.  Neurological: Negative.  Negative for dizziness, sensory change and headaches.  Endo/Heme/Allergies: Negative.   All other systems reviewed and are negative.  Vitals:   01/06/17 1130  BP: (!) 148/72    Pulse: 73  Resp: 16  Temp: 97.6 F (36.4 C)     Physical Exam  Constitutional: She is oriented to person, place, and time. She appears well-developed and well-nourished.  HENT:  Head: Normocephalic and atraumatic.  Mouth/Throat: No oropharyngeal exudate.  Eyes: EOM are normal. Pupils are equal, round, and reactive to light.  Neck: Normal range of motion. Neck supple.  Cardiovascular: Normal rate and regular rhythm.   Pulmonary/Chest: Effort normal.  Musculoskeletal:  RLE: mid lower inner shin shows +abrasion with surrounding tender and swollen tissue  Neurological: She is alert and oriented to person, place, and time. No sensory deficit. She exhibits normal muscle tone.  Skin: Skin is warm and dry. Capillary refill takes less than 2 seconds. No rash noted.  Psychiatric: She has a normal mood and affect. Her behavior is normal.  Vitals reviewed.  Dg Tibia/fibula Right  Result Date: 01/06/2017 CLINICAL DATA:  Right leg pain, initial encounter EXAM: RIGHT TIBIA AND FIBULA - 2 VIEW COMPARISON:  None. FINDINGS: There is no evidence of fracture or other focal bone lesions. Soft tissues are unremarkable. IMPRESSION: No acute abnormality noted. Electronically Signed   By: Inez Catalina M.D.   On: 01/06/2017 12:22     ASSESSMENT & PLAN: Tracey [name] was seen today for leg injury.  Diagnoses and all orders for this visit:  Leg injury, right, initial encounter -     DG Tibia/Fibula Right; Future  Contusion of right lower leg, initial encounter  Abrasion of right lower extremity, initial encounter  Other orders -     Tdap vaccine greater than or equal to 7yo IM    Patient Instructions       IF you received an x-ray today, you will receive an invoice from [facility]. Please contact [facility] at [phone] with questions or concerns regarding your invoice.   IF you received labwork today, you will  receive an invoice from [facility]. Please contact [facility] at  [phone] with questions or concerns regarding your invoice.   Our billing staff will not be able to assist you with questions regarding bills from these companies.  You will be contacted with the lab results as soon as they are available. The fastest way to get your results is to activate your My Chart account. Instructions are located on the last page of this paperwork. If you have not heard from [place] regarding the results in 2 weeks, please contact this office.      Wound Care, Adult Taking care of your wound properly can help to prevent pain and infection. It can also help your wound to heal more quickly. How is this treated? Wound care   Follow instructions from your health care provider about how to take care of your wound. Make sure you:  Wash your hands with soap and water before you change the bandage (dressing). If soap and water are not available, use hand sanitizer.  Change your dressing as told by your health care provider.  Leave stitches (sutures), skin glue, or adhesive strips in place. These skin closures may need to stay in place for 2 weeks or longer. If adhesive strip edges start to loosen and curl up, you may trim the loose edges. Do not remove adhesive strips completely unless your health care provider tells you to do that.  Check your wound area every day for signs of infection. Check for:  More redness, swelling, or pain.  More fluid or blood.  Warmth.  Pus or a bad smell.  Ask your health care provider if you should clean the wound with mild soap and water. Doing this may include:  Using a clean towel to pat the wound dry after cleaning it. Do not rub or scrub the wound.  Applying a cream or ointment. Do this only as told by your health care provider.  Covering the incision with a clean dressing.  Ask your health care provider when you can leave the wound uncovered. Medicines    If you were prescribed an antibiotic medicine, cream, or ointment,  take or use the antibiotic as told by your health care provider. Do not stop taking or using the antibiotic even if your condition improves.  Take over-the-counter and prescription medicines only as told by your health care provider. If you were prescribed pain medicine, take it at least 30 minutes before doing any wound care or as told by your health care provider. General instructions   Return to your normal activities as told by your health care provider. Ask your health care provider what activities are safe.  Do not scratch or pick at the wound.  Keep all follow-up visits as told by your health care provider. This is important.  Eat a diet that includes protein, vitamin A, vitamin C, and other nutrient-rich foods. These help the wound heal:  Protein-rich foods include meat, dairy, beans, nuts, and other sources.  Vitamin A-rich foods include carrots and dark green, leafy vegetables.  Vitamin C-rich foods include citrus, tomatoes, and other fruits and vegetables.  Nutrient-rich foods have protein, carbohydrates, fat, vitamins, or minerals. Eat a variety of healthy foods including vegetables, fruits, and whole grains. Contact a health care provider if:  You received a tetanus shot and you have swelling, severe pain, redness, or bleeding at the injection site.  Your pain is not controlled with medicine.  You have more redness, swelling, or pain around the wound.  You have more fluid or blood coming from the wound.  Your wound feels warm to the touch.  You have pus or a bad smell coming from the wound.  You have a fever or chills.  You are nauseous or you vomit.  You are dizzy. Get help right away if:  You have a red streak going away from your wound.  The edges of the wound open up and separate.  Your wound is bleeding and the bleeding does not stop with gentle pressure.  You have a rash.  You faint.  You have trouble breathing. This information is not intended  to replace advice given to you by your health care provider. Make sure you discuss any questions you have with your health care provider. Document Released: 05/12/2008 Document Revised: 04/01/2016 Document Reviewed: 02/18/2016 Elsevier Interactive Patient Education  2017 [place] A contusion is a deep bruise. Contusions happen when an injury causes bleeding under the skin. Symptoms of bruising include pain, swelling, and discolored skin. The skin may turn blue, purple, or yellow. Follow these instructions at home:  Rest the injured area.  If told, put ice on the injured area.  Put ice in a plastic bag.  Place a towel between your skin and the bag.  Leave the ice on for 20 minutes, 2-3 times per day.  If told, put light pressure (compression) on the injured area using an elastic bandage. Make sure the bandage is not too tight. Remove it and put it back on as told by your doctor.  If possible, raise (elevate) the injured area above the level of your heart while you are sitting or lying down.  Take over-the-counter and prescription medicines only as told by your doctor. Contact a doctor if:  Your symptoms do not get better after several days of treatment.  Your symptoms get worse.  You have trouble moving the injured area. Get help right away if:  You have very bad pain.  You have a loss of feeling (numbness) in a hand or foot.  Your hand or foot turns pale or cold. This information is not intended to replace advice given to you by your health care provider. Make sure you discuss any questions you have with your health care provider. Document Released: 01/20/2008 Document Revised: 01/09/2016 Document Reviewed: 12/19/2014 Elsevier Interactive Patient Education  2017 [employer]      Agustina Caroli, MD [facility]

## 2017-01-13 ENCOUNTER — Ambulatory Visit: Payer: Self-pay | Admitting: Oncology

## 2017-01-14 ENCOUNTER — Telehealth: Payer: Self-pay | Admitting: Oncology

## 2017-01-14 NOTE — Telephone Encounter (Signed)
Done

## 2017-01-14 NOTE — Telephone Encounter (Signed)
Can you please reschedule her 1230pm with Dr.Magrinat 6/25. Thank you.

## 2017-01-14 NOTE — Telephone Encounter (Signed)
Patient called and cancelled her appointment on 6/11 she will be out of town the next available is 6/25 at 12 noon.

## 2017-01-19 ENCOUNTER — Other Ambulatory Visit: Payer: Self-pay | Admitting: Oncology

## 2017-01-20 DIAGNOSIS — Z853 Personal history of malignant neoplasm of breast: Secondary | ICD-10-CM | POA: Diagnosis not present

## 2017-01-22 DIAGNOSIS — E538 Deficiency of other specified B group vitamins: Secondary | ICD-10-CM | POA: Diagnosis not present

## 2017-01-22 DIAGNOSIS — I10 Essential (primary) hypertension: Secondary | ICD-10-CM | POA: Diagnosis not present

## 2017-01-22 DIAGNOSIS — M109 Gout, unspecified: Secondary | ICD-10-CM | POA: Diagnosis not present

## 2017-01-22 DIAGNOSIS — E784 Other hyperlipidemia: Secondary | ICD-10-CM | POA: Diagnosis not present

## 2017-01-25 ENCOUNTER — Ambulatory Visit: Payer: Self-pay | Admitting: Oncology

## 2017-02-03 DIAGNOSIS — M109 Gout, unspecified: Secondary | ICD-10-CM | POA: Diagnosis not present

## 2017-02-03 DIAGNOSIS — R591 Generalized enlarged lymph nodes: Secondary | ICD-10-CM | POA: Diagnosis not present

## 2017-02-03 DIAGNOSIS — C50911 Malignant neoplasm of unspecified site of right female breast: Secondary | ICD-10-CM | POA: Diagnosis not present

## 2017-02-03 DIAGNOSIS — I25118 Atherosclerotic heart disease of native coronary artery with other forms of angina pectoris: Secondary | ICD-10-CM | POA: Diagnosis not present

## 2017-02-03 DIAGNOSIS — E538 Deficiency of other specified B group vitamins: Secondary | ICD-10-CM | POA: Diagnosis not present

## 2017-02-03 DIAGNOSIS — F418 Other specified anxiety disorders: Secondary | ICD-10-CM | POA: Diagnosis not present

## 2017-02-03 DIAGNOSIS — G43109 Migraine with aura, not intractable, without status migrainosus: Secondary | ICD-10-CM | POA: Diagnosis not present

## 2017-02-03 DIAGNOSIS — I10 Essential (primary) hypertension: Secondary | ICD-10-CM | POA: Diagnosis not present

## 2017-02-03 DIAGNOSIS — Z0000 Encounter for general adult medical examination without abnormal findings: Secondary | ICD-10-CM | POA: Diagnosis not present

## 2017-02-03 DIAGNOSIS — Z6826 Body mass index (BMI) 26.0-26.9, adult: Secondary | ICD-10-CM | POA: Diagnosis not present

## 2017-02-03 DIAGNOSIS — Z1389 Encounter for screening for other disorder: Secondary | ICD-10-CM | POA: Diagnosis not present

## 2017-02-03 DIAGNOSIS — E784 Other hyperlipidemia: Secondary | ICD-10-CM | POA: Diagnosis not present

## 2017-02-08 ENCOUNTER — Ambulatory Visit: Payer: Self-pay | Admitting: Oncology

## 2017-02-08 ENCOUNTER — Ambulatory Visit (HOSPITAL_BASED_OUTPATIENT_CLINIC_OR_DEPARTMENT_OTHER): Payer: Medicare Other | Admitting: Oncology

## 2017-02-08 VITALS — BP 147/52 | HR 63 | Temp 98.0°F | Resp 18 | Ht 62.0 in | Wt 149.9 lb

## 2017-02-08 DIAGNOSIS — C50411 Malignant neoplasm of upper-outer quadrant of right female breast: Secondary | ICD-10-CM

## 2017-02-08 DIAGNOSIS — Z79811 Long term (current) use of aromatase inhibitors: Secondary | ICD-10-CM | POA: Diagnosis not present

## 2017-02-08 DIAGNOSIS — Z170 Estrogen receptor positive status [ER+]: Secondary | ICD-10-CM

## 2017-02-08 NOTE — Progress Notes (Signed)
ID: THELIA TANKSLEY OB: [birth year]  MR#: [id number]  TGG#:[id number]  PCP: [name], MD/ [name] GYN:   SU: [name] OTHER MD: [name], William Hamburger [phone])  PCP: [name], MD GYN: SU: [name] OTHER MD: [name], Kandyce Rud  CHIEF COMPLAINT:right breast cancer  CURRENT TREATMENT: anastrozole daily.  From the original intakenote:  Tracey [name] had routine screening mammography July of 2014 showing a suspicious mass in her right breast. Additional views confirmed a mass in the upper outer quadrant of the right breast, which by ultrasound measured 4 mm. Biopsy of this mass was obtained in [place], at [facility]. 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  The patient's subsequent history is as detailed below  INTERVAL HISTORY: Tracey [name] returns today for follow-up and treatment of her estrogen receptor positive breast cancer. She continues on anastrozole, generally with good tolerance. Hot flashes have diminished. Vaginal dryness is not a major issue. She is having more aches and pains. She is not sure whether this is due to anastrozole, to a recent tick bite for which she is being treated with doxycycline, or to her statin.   REVIEW OF SYSTEMS: Tracey [name] is not exercising but "is going to start". She has a large family reunion coming up next week which she greatly looks forward 2 every year. Generally she feels things are going well for her and she is very thankful. A detailed review of systems was stable except as noted above  PAST MEDICAL HISTORY: Past Medical History:  Diagnosis Date  . Allergy   . Anxiety    takes Ativan daily prn  . Arthritis   . Breast cancer ([place]) 2014   ER+/PR+/HEr2-,   . Bruises easily   . Colon polyps    2 polyps by report  . Diverticulosis   . GERD  (gastroesophageal reflux disease)    occasionally takes Nexium   . Gout    takes Allopurinol daily and Colchicine daily prn;last attack 55yr ago  . Gout   . History of bladder infections    many yrs ago  . History of bronchitis    last time at least 822yrago  . History of stress incontinence   . Hyperlipidemia    takes Pravastatin daily  . Insomnia    takes Ambien nightly prn  . OSA (obstructive sleep apnea)    on CPAP  . Osteoarthritis   . Peripheral edema    takes Furosemide daily prn  . PONV (postoperative nausea and vomiting)   . Postmenopausal hormone therapy   . Radiation 07/27/13-09/07/13   Right Breast x 31 treatments  . Rhinitis    uses Flonase prn  . Sinus congestion   . Status post breast reconstruction 10/15/14   [place]  . Tachycardia    takes Metoprolol daily  . Ventricular tachycardia, non-sustained ([place]   during sleep study 2009 with normal cardiac workup    PAST SURGICAL HISTORY: Past Surgical History:  Procedure Laterality Date  . ABDOMINAL HYSTERECTOMY    . APPENDECTOMY    . AXILLARY SENTINEL NODE BIOPSY Right 05/23/2013   Procedure: AXILLARY SENTINEL NODE BIOPSY;  Surgeon: ToOdis HollingsheadMD;  [facility] Service: General;  Laterality: Right;  nuc med injection 7:00  . bilateral cataract surgery    . BREAST RECONSTRUCTION Bilateral 05/17/2014  W  . BREAST RECONSTRUCTION WITH PLACEMENT OF TISSUE EXPANDER AND FLEX HD (ACELLULAR HYDRATED DERMIS) Bilateral 05/23/2013   Procedure: BILATERAL BREAST RECONSTRUCTION WITH PLACEMENT OF TISSUE EXPANDER AND FLEX HD;  Surgeon: [name], MD;  Location: [facility];  Service: Plastics;  Laterality: Bilateral;  . CARDIAC CATHETERIZATION  1999  . CHOLECYSTECTOMY    . COLONOSCOPY    . COSMETIC SURGERY    . GALLBLADDER SURGERY    . REMOVAL OF BILATERAL TISSUE EXPANDERS WITH PLACEMENT OF BILATERAL BREAST IMPLANTS Bilateral 05/17/2014   Procedure: REMOVAL OF  BILATERAL TISSUE EXPANDERS WITH PLACEMENT OF BILATERAL BREAST IMPLANTS FOR RECONSTRUCTION;  Surgeon: [name], MD;  Location: [facility];  Service: Plastics;  Laterality: Bilateral;  . TONSILLECTOMY    . TOTAL MASTECTOMY Bilateral 05/23/2013   WITH RECONSTRUCTION  . TOTAL MASTECTOMY Bilateral 05/23/2013   Procedure: TOTAL MASTECTOMY;  Surgeon: [name], MD;  Location: [facility];  Service: General;  Laterality: Bilateral;  . urinary tightening     d/t urinary incontinence    FAMILY HISTORY Family History  Problem Relation Age of Onset  . Breast cancer Mother        possible inflammatory breast cancer  . Heart attack Father   . Heart disease Father   . ALS Sister   . Breast cancer Maternal Grandmother 50  . Heart attack Maternal Grandfather   . Heart attack Paternal Grandfather   . Breast cancer Maternal Aunt        dx in her 8s  . Breast cancer Other        maternal great grandmother; dx in her 75s   the patient's father died from heart disease at the age of 76. The patient's mother died from breast cancer at the age of 29. It is not clear when she was diagnosed since she "dictated and". The patient had no brothers. One sister died from [place] at the age of 49 in addition, one of the patient's mother is sisters was diagnosed with breast cancer in her 42s. The patient's mother is mother, was also diagnosed with breast cancer, at the age of 58. The patient's daughter, [name], has been tested for the BRCA genes and was negative.  GYNECOLOGIC HISTORY:  Menarche age 69, first live birth age 38, the patient is [place] P2. She underwent total abdominal hysterectomy and bilateral salpingo-oophorectomy at the age of 45 for endometriosis. She took hormone replacement until August of 2014.  SOCIAL HISTORY:  Amarra moved to [place] 4 years ago and is planning to make this area her "home-base", although they also have a home in [place] where they go for the winter to ski. 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 They are now enjoying their retirement. Daughter [name] lives in [place]. A younger daughter [name] is a homemaker in [place]. The patient has 3 grandchildren. She is a Furniture conservator/restorer.    ADVANCED DIRECTIVES: In place   HEALTH MAINTENANCE: Social History  Substance Use Topics  . Smoking status: Former Smoker    Packs/day: 1.00    Years: 20.00    Start date: 06/09/1992  . Smokeless tobacco: Never Used     Comment: quit at age 24  . Alcohol use 3.0 oz/week    5 Glasses of wine per week     Comment: nightly     Colonoscopy: 2010  PAP: August 2014  Bone density: 2010, normal per patient  Lipid panel:  Allergies  Allergen Reactions  . Ivp Dye [Iodinated Diagnostic Agents] Hives  . Sulfa Antibiotics Swelling    Current Outpatient Prescriptions  Medication Sig Dispense Refill  . allopurinol (ZYLOPRIM) 100 MG tablet TAKE 1 TABLET BY MOUTH DAILY 90 tablet 0  . allopurinol (ZYLOPRIM) 100 MG tablet TAKE 1 TABLET BY MOUTH ONCE DAILY 90 tablet 0  . anastrozole (ARIMIDEX) 1 MG tablet Take 1 tablet (1 mg total) by mouth daily. 90 tablet 3  . celecoxib (CELEBREX) 200 MG capsule TK ONE C PO QD  3  . cholecalciferol 5000 UNITS TABS Take 0.2 tablets (1,000 Units total) by mouth daily. 100 tablet 3  . colchicine 0.6 MG tablet TAKE 1 TABLET BY MOUTH EVERY DAY OR AS NEEDED 30 tablet 0  . Cyanocobalamin (VITAMIN B-12 IJ) Inject 1,000 mcg as directed every 30 (thirty) days. Normally 1st of the month    . cyclobenzaprine (FLEXERIL) 10 MG tablet TK 1 T PO Q 8 H PRF MUSCLE SPASMS  0  . esomeprazole (NEXIUM) 40 MG capsule TAKE ONE CAPSULE BY MOUTH EVERY DAY AT NOON 14 capsule 0  . fluticasone (FLONASE) 50 MCG/ACT nasal spray Place 1 spray into the nose daily as needed for rhinitis.     . furosemide (LASIX) 40 MG tablet Take 40 mg by mouth daily as needed for fluid.     . hydrOXYzine  (ATARAX/VISTARIL) 25 MG tablet Take 0.5-1 tablets (12.5-25 mg total) by mouth every 8 (eight) hours as needed for itching. 30 tablet 0  . LORazepam (ATIVAN) 0.5 MG tablet Take 0.5 mg by mouth at bedtime as needed for anxiety (or sleep).    . menthol-cetylpyridinium (CEPACOL) 3 MG lozenge Take 1 lozenge (3 mg total) by mouth as needed for sore throat. 100 tablet 12  . pravastatin (PRAVACHOL) 40 MG tablet TAKE 1 TABLET BY MOUTH ONCE DAILY 90 tablet 0  . Triamcinolone Acetonide (TRIAMCINOLONE 0.1 % CREAM : EUCERIN) CREA Apply 1 application topically 3 (three) times daily. 1 each 1  . venlafaxine XR (EFFEXOR-XR) 150 MG 24 hr capsule TAKE 1 CAPSULE BY MOUTH DAILY WITH BREAKFAST 90 capsule 0  . venlafaxine XR (EFFEXOR-XR) 37.5 MG 24 hr capsule Take 1 capsule (37.5 mg total) by mouth daily. 90 capsule 3   No current facility-administered medications for this visit.     OBJECTIVE: Middle-aged white woman In no acute distress Vitals:   02/08/17 1251  BP: (!) 147/52  Pulse: 63  Resp: 18  Temp: 98 F (36.7 C)     Body mass index is 27.42 kg/m.    ECOG FS:1 - Symptomatic but completely ambulatory Filed Weights   02/08/17 1251  Weight: 149 lb 14.4 oz (68 kg)    Sclerae unicteric, pupils round and equal Oropharynx clear and moist No cervical or supraclavicular adenopathy Lungs no rales or rhonchi Heart regular rate and rhythm Abd soft, nontender, positive bowel sounds MSK no focal spinal tenderness, no upper extremity lymphedema Neuro: nonfocal, well oriented, appropriate affect Breasts: Status post bilateral mastectomies with bilateral D IEP reconstruction. The cosmetic result is good. There is no evidence of local recurrence. Both axillae are benign.    LAB RESULTS:   Lab Results  Component Value Date   WBC 5.3 01/06/2017   NEUTROABS 3.1 01/06/2017   HGB 13.5 01/06/2017   HCT 41.4 01/06/2017   MCV 93.7 01/06/2017   PLT 162 01/06/2017      Chemistry      Component Value  Date/Time  NA 141 01/06/2017 1316   K 4.4 01/06/2017 1316   CL 99 05/09/2014 1231   CO2 23 01/06/2017 1316   BUN 23.3 01/06/2017 1316   CREATININE 0.9 01/06/2017 1316      Component Value Date/Time   CALCIUM 10.0 01/06/2017 1316   ALKPHOS 85 01/06/2017 1316   AST 27 01/06/2017 1316   ALT 40 01/06/2017 1316   BILITOT 0.47 01/06/2017 1316       STUDIES: Plain films of the right tibia and fibula 01/06/2017 showed no acute abnormality  ASSESSMENT: 71 y.o. BRCA 1 & 2 negative [place] woman   (1)  status post right breast upper outer quadrant biopsy 03/24/2013 for a pT1b cN0, stage IA invasive ductal carcinoma, grade 1, strongly estrogen and progesterone receptor positive, HER-2 negative  (2) status post bilateral mastectomies 05/23/2013 showing:  (a) on the right, a pT1b pN0, stage invasive ductal carcinoma, grade 1, again HER-2 negative  (b) on the left, no evidence of malignancy  (3) Oncotype DX score of 20 predicted a rate of distant recurrence within 10 years of 13% if the patient's only systemic treatment was tamoxifen for 5 years.  (4) a positive margin required adjuvant radiation, completed January 2015 in [place]  (5) status post bilateral implant placement January 2015  (a) with impending right implant rupture February 2016 underwent bilateral DIEP reconstruction under Dr. Kandyce Rud in [place] (fax [phone])  (6)  started on anastrozole, mid September 2014, prior to definitive surgery; continued postop; bone density 07/31/2013 was normal  (a) repeat bone density 02/03/2016 showed a T score of -0.1 (normal  PLAN: Lorien is now approximately 3-1/2 years out from definitive surgery with no evidence of disease recurrence. This is very favorable.  She is having some arthralgias and myalgias of unclear etiology. She is currently being treated for a tick bite. If that doesn't clear the problem she tells me she will be stopping her statin for some weeks to see if  that helps or not.  Accordingly she will call me in about 2 months. If those 2 maneuvers did not take care of the problem we will stop the anastrozole for 2 months and reassess. I reassured her that I would not expect this to place her in any major risk of breast cancer recurrence. It takes about 2 months in any case for anastrozole to be completely cleared from the system.  Assuming all goes well, she will have a repeat bone density after June 2019 and she will see me again in July of that year. At this point she is considering staying on anastrozole for the full 7 years which are currently considered the optimal duration of treatment  She knows to call for any other issues that may develop before her next visit.   Chauncey Cruel, MD   02/08/2017 6:27 PM

## 2017-02-24 ENCOUNTER — Other Ambulatory Visit: Payer: Self-pay | Admitting: Oncology

## 2017-03-01 ENCOUNTER — Ambulatory Visit: Payer: Medicare Other | Admitting: Neurology

## 2017-04-07 DIAGNOSIS — Z961 Presence of intraocular lens: Secondary | ICD-10-CM | POA: Diagnosis not present

## 2017-04-07 DIAGNOSIS — H524 Presbyopia: Secondary | ICD-10-CM | POA: Diagnosis not present

## 2017-04-07 DIAGNOSIS — G43909 Migraine, unspecified, not intractable, without status migrainosus: Secondary | ICD-10-CM | POA: Diagnosis not present

## 2017-04-19 ENCOUNTER — Other Ambulatory Visit: Payer: Self-pay | Admitting: Oncology

## 2017-04-23 DIAGNOSIS — Z419 Encounter for procedure for purposes other than remedying health state, unspecified: Secondary | ICD-10-CM | POA: Diagnosis not present

## 2017-04-23 DIAGNOSIS — D225 Melanocytic nevi of trunk: Secondary | ICD-10-CM | POA: Diagnosis not present

## 2017-04-23 DIAGNOSIS — D485 Neoplasm of uncertain behavior of skin: Secondary | ICD-10-CM | POA: Diagnosis not present

## 2017-05-24 ENCOUNTER — Encounter: Payer: Self-pay | Admitting: Neurology

## 2017-05-24 ENCOUNTER — Ambulatory Visit (INDEPENDENT_AMBULATORY_CARE_PROVIDER_SITE_OTHER): Payer: Medicare Other | Admitting: Neurology

## 2017-05-24 VITALS — BP 138/84 | HR 70 | Ht 62.0 in | Wt 152.0 lb

## 2017-05-24 DIAGNOSIS — Z9989 Dependence on other enabling machines and devices: Secondary | ICD-10-CM

## 2017-05-24 DIAGNOSIS — G4731 Primary central sleep apnea: Secondary | ICD-10-CM

## 2017-05-24 DIAGNOSIS — G4733 Obstructive sleep apnea (adult) (pediatric): Secondary | ICD-10-CM

## 2017-05-24 NOTE — Progress Notes (Signed)
SLEEP MEDICINE CLINIC   Provider:  Larey Seat, M D  Referring Provider: [name], MD Primary Care Physician:  [name], MD  Chief Complaint  Patient presents with  . Follow-up    pt alone, rm 10. cpap is working well    HPI:  Tracey [name] is a 71 y.o. female , seen here as a referral from Dr. Ardeth Perfect for a transfer of sleep care.  Chief complaint according to patient : " re evaluate my apnea and care."  The patient reports that she moved to the [place] area about 3 years ago and still gets supplies for her established CPAP per Mail. She was diagnosed with obstructive sleep apnea in [place], in [place]. The [facility] posted the sleep center. The patient was diagnosed on 05/02/2008 the study revealed an AHI of 23 was mild decreases in oxygen level as well as continuous snoring. Then CPAP was used for the second night of the split night polysomnography and her oxygen level improved to normal her snoring resolved and the breathing was totally regulated according to Dr. Shelton Silvas note. Concerning was that there was a ventricular tachycardia as of 27 beats. The patient was evaluated by cardiology afterwards the finding could never be reciprocated. She continued to use CPAP for the last 7 years compliantly but she is a little tired of using a nasal mask that has become uncomfortable and his pressure marks on her face. She is also not sure to what degree apnea still present at this time. The patient stays about 7 months of the year here in [place], and 5 months in [place]. Usually they travel from Thanksgiving to Easter. Her husband witnesses snoring when she naps, but not while on CPAP. Her insomnia improved drastically after CPAP.   Sleep habits are as follows: The patient goes to bed between 9.30 and 10 PM, usually falls asleep promptly. She prefers the left side to sleep , but wakes sometimes up on her back.  She learned to sleep on her back  after breast cancer surgery ( diagnosed Aug 8th 2014 ). The bedroom is described as core, quiet and dark. The patient shares a bedroom with her husband. They're also 2 dogs in the bedroom on the bed. She rises usually once to go to the bathroom sometimes twice. She can fall asleep again fairly promptly. She is not a restless sleeper not kicking or moving or not excessively at night. She rises in the morning at 5 sometimes even 8. She usually wakes spontaneously not based on an alarm setting. She averages 7-8 hours of nocturnal sleep.  She reports vivid dreams, "repetitive and in full color".  Some mornings she feels refreshed and restored but not all the time. She is still recovering from her breast reconstruction surgery. She endorsed some snoring, joint pain, aching muscles and even some dizziness. She also has right shoulder bursitis which limits her ability to sleep on the right. She naps 3 out of 7 days, once  daily, a nap will last  2 hours unless she sets an alarm.   Tracey [name] this past medical history includes gout, hyperlipidemia, hypertension, palpitations probably due to nonsustained ventricular tachycardia. In addition breast cancer of which her mother maternal aunt maternal grandmother and maternal great-grandmother were also affected. She is a history of allergic rhinitis her sister died in 10-24-11 of ALS. Sleep medical history and family sleep history: Her father was diagnosed with OSA never used CPAP ,  had an MI  at age 90. Social history: non smoker, quit at age 61. ETOH , 1-2 at night. Caffeine use : none. Decaffeinated tea and coffee.  Average and regular golf player. She likes winter sports, especially skiing but uses the left dangerous slope these days.  Interval history from 05-20-15. Hearing has been willing to undergo a new sleep study and attended nocturnal polysomnography on 04-09-15. She was diagnosed with a very mild AHI of 5.2 , supine sleep  AHI became 12.4  and it was further  evident that the patient did not get into REM sleep. For this reason I feel it is much more important that the patient stays on CPAP given that her apnea was mild and she was fitted with a new mask. The new mass does not cause pressure marks around her nose and does not entangle her hair. She is using a dream wear interface, which [employer] had to explicitly order for her.  Interval history from 12/23/2015. Tracey [name] has happily returned from [place], I have the pleasure today to see her CPAP download which confirms and 96.7% compliance average user time 6 hours and 39 minutes of CPAP.The device is a C flex set at 8 cm water pressure with a ramp time of 30 minutes. The REM time is a little too long and we will reduce it to 10 minutes. Her husband has made her aware that she still snores when she doesn't use a CPAP. She has become more compliant she endorsed only 2 points today on the geriatric depression store, 7 points on the Epworth sleepiness score and the fatigue severity at 29.  Interval history from 02/26/2016. Tracey [name] is here today relaxed after a week vacation. She reports sleeping so much better with the new CPAP machine. Her sleep is deeper but she does not recall having nightmares she has fallen out of bed 1 night it was actually the third night she used the new CPAP. She feels her sleep is less fragmented, more sound, and more refreshing and restorative. Her download revealed 100% compliance over the last 30 days was 97% compliance 4 hours of use, average user time is 7 hours and 41 minutes. This is excellent compliance with CPAP is set at 8 cm water we did  use a 10 minute ramp function. Her snoring has of course been alleviated using CPAP, which pleases her husband. She has a residual AHI of 8.1 but given her decrease and sleepiness/ fatigue I would not want to change the settings.  Interval history from 12/21/2016, Tracey [name] has been to winter in [place] and had a additional  medication in [place]. She is now doing well but during her winter stay in [place] she developed shortness of breath and apparently more severe hypoxemia and apnea. She saw a pulmonologist at the location would tested her oxygen levels while walking and also diagnosed her with type a flu. She had been coughing for over 6 weeks when the flu finally converted to bronchitis and then resolved. She remained air hungry and short of breath for much of her winter time. I'm able to state today that she feels back to her baseline. But we need to discuss how we could prevent altitude related apneas which can often be central in nature and if she should use an AutoPap with an automatic pressure window. The patient is a very highly compliant CPAP user her compliance rate was 93.3% with an average user time of 7 hours and 6 minutes. She has some air leaks  but not significant once CPAP is set at 8 cm water pressure with a residual AHI of 17.4. She has a short ramp time. Most of the AHI is related to shallow breathing. Her average central apnea index is 1.1 and obstructive apnea index is 3.3. Our goal is is to have an AHI of below 5. I will ask [employer] to change her to an auto titration from 5-10 cm water. She continues to use a dream wear interface. She would like the non-prong version of the dream wear interface. We will also meet again in late September or October to discuss if she needs additional oxygen at night during her stays at high altitudes.  05-24-2017, Tracey [name] is planning again to spend the winter in [place]. She asked me to make sure that she will have oxygen available for nightly use in the upper inner air. Last year she stated she was miserable on CPAP alone. She is a very compliant CPAP use of his 100% compliance and an average user time of 9 hours and 2 minutes, uses the machine as an auto CPAP between 5 and 15 cm water, average pressure is 9.9 cm water and the average residual AHI is 5.1. The  machine allows between 5 and 10 cm water pressure only, given that the patient does not have central apneas I will increase the upper limits to 12 cm water. I would also investigate is able care can help [place] with oxygen in [place] ( she knows an oxygen supplier in [place]- "[employer]" ) .   Review of Systems: Out of a complete 14 system review, the patient complains of only the following symptoms, and all other reviewed systems are negative. Snoring.  Grieving - lost mother and sister to breast cancer in a short time and was diagnosed with breast cancer herself 3.5 years ago.  On antihormone therapy. She takes statins.  Epworth score 10 , Fatigue severity score 31 from 41, geriatric depression score 2 , her machine is newish. Nasal pillow. No air-leak data.    Social History   Social History  . Marital status: Married    Spouse name: N/A  . Number of children: 2  . Years of education: N/A   Occupational History  . Not on file.   Social History Main Topics  . Smoking status: Former Smoker    Packs/day: 1.00    Years: 20.00    Start date: 06/09/1992  . Smokeless tobacco: Never Used     Comment: quit at age 31  . Alcohol use 3.0 oz/week    5 Glasses of wine per week     Comment: nightly  . Drug use: No     Comment: quit 24 years ago  . Sexual activity: Yes    Birth control/ protection: Surgical   Other Topics Concern  . Not on file   Social History Narrative  . No narrative on file    Family History  Problem Relation Age of Onset  . Breast cancer Mother        possible inflammatory breast cancer  . Heart attack Father   . Heart disease Father   . ALS Sister   . Breast cancer Maternal Grandmother 65  . Heart attack Maternal Grandfather   . Heart attack Paternal Grandfather   . Breast cancer Maternal Aunt        dx in her 18s  . Breast cancer Other        maternal great grandmother; dx  in her 8s    Past Medical History:  Diagnosis Date  . Allergy   .  Anxiety    takes Ativan daily prn  . Arthritis   . Breast cancer ([place]) 2014   ER+/PR+/HEr2-,   . Bruises easily   . Colon polyps    2 polyps by report  . Diverticulosis   . GERD (gastroesophageal reflux disease)    occasionally takes Nexium   . Gout    takes Allopurinol daily and Colchicine daily prn;last attack 18yr ago  . Gout   . History of bladder infections    many yrs ago  . History of bronchitis    last time at least [id number]  . History of stress incontinence   . Hyperlipidemia    takes Pravastatin daily  . Insomnia    takes Ambien nightly prn  . OSA (obstructive sleep apnea)    on CPAP  . Osteoarthritis   . Peripheral edema    takes Furosemide daily prn  . PONV (postoperative nausea and vomiting)   . Postmenopausal hormone therapy   . Radiation 07/27/13-09/07/13   Right Breast x 31 treatments  . Rhinitis    uses Flonase prn  . Sinus congestion   . Status post breast reconstruction 10/15/14   [place]  . Tachycardia    takes Metoprolol daily  . Ventricular tachycardia, non-sustained ([place]   during sleep study 2009 with normal cardiac workup    Past Surgical History:  Procedure Laterality Date  . ABDOMINAL HYSTERECTOMY    . APPENDECTOMY    . AXILLARY SENTINEL NODE BIOPSY Right 05/23/2013   Procedure: AXILLARY SENTINEL NODE BIOPSY;  Surgeon: ToOdis HollingsheadMD;  [facility]: [facility];  Laterality: Right;  nuc med injection 7:00  . bilateral cataract surgery    . BREAST RECONSTRUCTION Bilateral 05/17/2014   W  . BREAST RECONSTRUCTION WITH PLACEMENT OF TISSUE EXPANDER AND FLEX HD (ACELLULAR HYDRATED DERMIS) Bilateral 05/23/2013   Procedure: BILATERAL BREAST RECONSTRUCTION WITH PLACEMENT OF TISSUE EXPANDER AND FLEX HD;  Surgeon: DaCrissie ReeseMD;  [facility]: [facility];  Laterality: Bilateral;  . CARDIAC CATHETERIZATION  1999  . CHOLECYSTECTOMY    . COLONOSCOPY    . COSMETIC SURGERY     . GALLBLADDER SURGERY    . REMOVAL OF BILATERAL TISSUE EXPANDERS WITH PLACEMENT OF BILATERAL BREAST IMPLANTS Bilateral 05/17/2014   Procedure: REMOVAL OF BILATERAL TISSUE EXPANDERS WITH PLACEMENT OF BILATERAL BREAST IMPLANTS FOR RECONSTRUCTION;  Surgeon: DaCrissie ReeseMD;  [facility];  Laterality: Bilateral;  . TONSILLECTOMY    . TOTAL MASTECTOMY Bilateral 05/23/2013   WITH RECONSTRUCTION  . TOTAL MASTECTOMY Bilateral 05/23/2013   Procedure: TOTAL MASTECTOMY;  Surgeon: ToOdis HollingsheadMD;  [facility]: [facility];  Laterality: Bilateral;  . urinary tightening     d/t urinary incontinence    Current Outpatient Prescriptions  Medication Sig Dispense Refill  . allopurinol (ZYLOPRIM) 100 MG tablet TAKE 1 TABLET BY MOUTH DAILY 90 tablet 0  . allopurinol (ZYLOPRIM) 100 MG tablet TAKE 1 TABLET BY MOUTH ONCE DAILY 90 tablet 0  . anastrozole (ARIMIDEX) 1 MG tablet Take 1 tablet (1 mg total) by mouth daily. 90 tablet 3  . celecoxib (CELEBREX) 200 MG capsule TK ONE C PO QD  3  . cholecalciferol 5000 UNITS TABS Take 0.2 tablets (1,000 Units total) by mouth daily. 100 tablet 3  . colchicine  0.6 MG tablet TAKE 1 TABLET BY MOUTH EVERY DAY OR AS NEEDED 30 tablet 0  . Cyanocobalamin (VITAMIN B-12 IJ) Inject 1,000 mcg as directed every 30 (thirty) days. Normally 1st of the month    . cyclobenzaprine (FLEXERIL) 10 MG tablet TK 1 T PO Q 8 H PRF MUSCLE SPASMS  0  . esomeprazole (NEXIUM) 40 MG capsule TAKE ONE CAPSULE BY MOUTH EVERY DAY AT NOON 14 capsule 0  . fluticasone (FLONASE) 50 MCG/ACT nasal spray Place 1 spray into the nose daily as needed for rhinitis.     . furosemide (LASIX) 40 MG tablet Take 40 mg by mouth daily as needed for fluid.     Marland Kitchen LORazepam (ATIVAN) 0.5 MG tablet Take 0.5 mg by mouth at bedtime as needed for anxiety (or sleep).    . menthol-cetylpyridinium (CEPACOL) 3 MG lozenge Take 1 lozenge (3 mg total) by mouth as needed for sore throat. 100 tablet  12  . pravastatin (PRAVACHOL) 40 MG tablet TAKE 1 TABLET BY MOUTH ONCE DAILY 90 tablet 0  . Triamcinolone Acetonide (TRIAMCINOLONE 0.1 % CREAM : EUCERIN) CREA Apply 1 application topically 3 (three) times daily. 1 each 1  . venlafaxine XR (EFFEXOR-XR) 150 MG 24 hr capsule TAKE 1 CAPSULE BY MOUTH DAILY WITH BREAKFAST 90 capsule 0  . venlafaxine XR (EFFEXOR-XR) 37.5 MG 24 hr capsule Take 1 capsule (37.5 mg total) by mouth daily. 90 capsule 3   No current facility-administered medications for this visit.     Allergies as of 05/24/2017 - Review Complete 05/24/2017  Allergen Reaction Noted  . Ivp dye [iodinated diagnostic agents] Hives 06/09/2012  . Sulfa antibiotics Swelling 06/09/2012    Vitals: BP 138/84   Pulse 70   Ht 5' 2"  (1.575 m)   Wt 152 lb (68.9 kg)   BMI 27.80 kg/m  Last Weight:  Wt Readings from Last 1 Encounters:  05/24/17 152 lb (68.9 kg)   MEB:RAXE mass index is 27.8 kg/m.     Last Height:   Ht Readings from Last 1 Encounters:  05/24/17 5' 2"  (1.575 m)    Physical exam:  General: The patient is awake, alert and appears not in acute distress. The patient is well groomed. She reports sweating a lot.  Head: Normocephalic, atraumatic. Neck is supple. Mallampati 4,  neck circumference: 16.25  Nasal airflow unrestricted, TMJ is not evident. Retrognathia is not seen. She has a crowded lower jar. Cardiovascular:  Regular rate and rhythm , without  murmurs or carotid bruit, and without distended neck veins. Respiratory: Lungs are clear to auscultation.Skin:  Without evidence of edema, or rashTrunk: BMI is 28. The patient's posture is erect.   Neurologic exam :Speech is fluent,  without  dysarthria, dysphonia or aphasia. Mood and affect are appropriate.  Cranial nerves:Pupils are equal and briskly reactive to light. Extraocular movement are intact. Hearing to finger rub intact. Facial sensation intact to fine touch.Facial motor strength is symmetric and tongue and uvula  moves midline. Shoulder shrug was symmetrical. She is very tense over the shoulder area. The patient was advised of the nature of the diagnosed sleep disorder , the treatment options and risks for general a health and wellness arising from not treating the condition.   I spent more than 25 minutes of face to face time with the patient. Greater than 50% of time was spent in counseling and coordination of care. We have discussed the diagnosis and differential and I answered the patient's questions.   She would  like to have oxygen in [place].   She is not longer excessively fatigued or sleepy and when she uses her machine, she doesn't snore- which was confirmed by her husband. She does not have trouble sleeping and she does not have trouble tolerating higher pressures She will continue to use CPAP now to Auto 5-12 cm water and will need 02 during her snow birding in [place], [place]  and return in 12 month. My Assessment is that of a patient with oxygen need when travelling to her winter home in [place]. She was diagnosed with OSA in [place] , in 2009.    Asencion Partridge Ry Moody MD  05/24/2017   CC: [name], [address] [place], [place] [address]

## 2017-05-26 ENCOUNTER — Telehealth: Payer: Self-pay | Admitting: *Deleted

## 2017-05-26 NOTE — Telephone Encounter (Signed)
This RN spoke with pt per her call stating she is following up with Dr Jannifer Rodney per her last visit and issues of arthralgias -   " he had me stop the pravachol and told me to call with an update in 2 months "  Merridith states arthralgias are no better " so it's not the pravachol "  Kalah also states " it seems my hot flashes are more intense and I don't understand why and what I can do for them "  This RN discussed hot flashes with pt and that during antiestrogen use hot flashes can wax and wan- and may also be affected by other issues ( stress, diet, etc ).  This RN verified pt is continuing on venlafaxine ( verified ).  Informed pt that there are other medications for hot flashes - but these too have side effects which may not be beneficial. Discussed with pt known benefit with yoga and " paced breathing ( respirations )- Shatarra seemed interested in these choices.  This RN informed pt above would be discussed with MD for further recommendations.  Per MD dictation from last visit he stated plan to stop the pravachol - monitor and if symptoms did not improve to proceed with stopping the anastrazole for 2 months.  This RN returned call and obtained an identified VM- above stated as well as appointment for MD follow up will be made for mid December so MD can discuss medication changes.

## 2017-05-27 ENCOUNTER — Other Ambulatory Visit: Payer: Self-pay | Admitting: Oncology

## 2017-05-28 ENCOUNTER — Telehealth: Payer: Self-pay | Admitting: Oncology

## 2017-05-28 DIAGNOSIS — Z23 Encounter for immunization: Secondary | ICD-10-CM | POA: Diagnosis not present

## 2017-05-28 NOTE — Telephone Encounter (Signed)
Scheduled appt per 10/10 sch message - unable to leave message - sent reminder letter in the mail. F/u mid December

## 2017-06-02 ENCOUNTER — Telehealth: Payer: Self-pay

## 2017-06-02 ENCOUNTER — Other Ambulatory Visit: Payer: Self-pay

## 2017-06-02 DIAGNOSIS — C50411 Malignant neoplasm of upper-outer quadrant of right female breast: Secondary | ICD-10-CM

## 2017-06-02 DIAGNOSIS — Z170 Estrogen receptor positive status [ER+]: Secondary | ICD-10-CM

## 2017-06-02 NOTE — Telephone Encounter (Signed)
Pt called stating she is going out of town for 6 months after Thanksgiving and wants to reschedule her Dec appt with Dr Jana Hakim.  Per Dr Jana Hakim, [place] for pt to be seen by Wilber Bihari, NP.  Appt has been rescheduled for 07/02/17 at 10:30am.  Pt is aware of new date/time.    Pt will also need a breast MRI prior to appt.  Order has been placed for MRI at [facility].  Pt states she prefers to  Make the appt herself.

## 2017-06-22 ENCOUNTER — Ambulatory Visit
Admission: RE | Admit: 2017-06-22 | Discharge: 2017-06-22 | Disposition: A | Discharge status: Home / Self Care | Payer: Medicare Other | Source: Ambulatory Visit | Attending: Oncology | Admitting: Oncology

## 2017-06-22 DIAGNOSIS — Z170 Estrogen receptor positive status [ER+]: Secondary | ICD-10-CM

## 2017-06-22 DIAGNOSIS — N6489 Other specified disorders of breast: Secondary | ICD-10-CM | POA: Diagnosis not present

## 2017-06-22 DIAGNOSIS — C50411 Malignant neoplasm of upper-outer quadrant of right female breast: Secondary | ICD-10-CM

## 2017-06-22 MED ORDER — GADOBENATE DIMEGLUMINE 529 MG/ML IV SOLN
14.0000 mL | Freq: Once | INTRAVENOUS | Status: AC | PRN
  Administered 2017-06-22: 14 mL via INTRAVENOUS

## 2017-07-02 ENCOUNTER — Ambulatory Visit (HOSPITAL_BASED_OUTPATIENT_CLINIC_OR_DEPARTMENT_OTHER): Payer: Medicare Other | Admitting: Adult Health

## 2017-07-02 ENCOUNTER — Encounter: Payer: Self-pay | Admitting: Adult Health

## 2017-07-02 ENCOUNTER — Telehealth: Payer: Self-pay | Admitting: Oncology

## 2017-07-02 VITALS — BP 154/77 | HR 100 | Temp 98.0°F | Resp 18 | Ht 62.0 in | Wt 154.3 lb

## 2017-07-02 DIAGNOSIS — C50411 Malignant neoplasm of upper-outer quadrant of right female breast: Secondary | ICD-10-CM

## 2017-07-02 DIAGNOSIS — Z170 Estrogen receptor positive status [ER+]: Secondary | ICD-10-CM

## 2017-07-02 DIAGNOSIS — Z923 Personal history of irradiation: Secondary | ICD-10-CM | POA: Diagnosis not present

## 2017-07-02 DIAGNOSIS — Z79811 Long term (current) use of aromatase inhibitors: Secondary | ICD-10-CM | POA: Diagnosis not present

## 2017-07-02 MED ORDER — TAMOXIFEN CITRATE 20 MG PO TABS: 20.0000 mg | ORAL_TABLET | Freq: Every day | ORAL | 5 refills | Status: DC

## 2017-07-02 NOTE — Progress Notes (Signed)
CLINIC:  Survivorship   REASON FOR VISIT:  Routine follow-up for history of breast cancer.   BRIEF ONCOLOGIC HISTORY:  Per Dr. Virgie [name] note from 02/08/2017   BRCA 1 & 2 negative [name] woman   (1)  status post right breast upper outer quadrant biopsy 03/24/2013 for a pT1b cN0, stage IA invasive ductal carcinoma, grade 1, strongly estrogen and progesterone receptor positive, HER-2 negative  (2) status post bilateral mastectomies 05/23/2013 showing:             (a) on the right, a pT1b pN0, stage invasive ductal carcinoma, grade 1, again HER-2 negative             (b) on the left, no evidence of malignancy  (3) Oncotype DX score of 20 predicted a rate of distant recurrence within 10 years of 13% if the patient's only systemic treatment was tamoxifen for 5 years.  (4) a positive margin required adjuvant radiation, completed January 2015 in [place]  (5) status post bilateral implant placement January 2015             (a) with impending right implant rupture February 2016 underwent bilateral DIEP reconstruction under Dr. Kandyce [name] in [place] (fax [phone])  (6)  started on anastrozole, mid September 2014, prior to definitive surgery; continued postop; bone density 07/31/2013 was normal             (a) repeat bone density 02/03/2016 showed a T score of -0.1 (normal  INTERVAL HISTORY:  Tracey [name] presents to the [facility] today for routine follow-up for her history of breast cancer.  Overall, she reports feeling quite well. She was last seen by Dr. Jana [name] in 01/2017 and was having aches and pains.  Due to these issues she stopped Pravachol to see if that would help.  It did not, so she was instructed to stop the anastrozole x 2 months.  She says she feels tremendously better.  She is no longer sweating, her nightmares are gone, she says she is overall a much happier person.  She says she didn't realize how poorly she felt on the Anastrozole until she  stopped it.      REVIEW OF SYSTEMS:  Review of Systems  Constitutional: Negative for appetite change, chills, fatigue, fever and unexpected weight change.  HENT:   Negative for hearing loss and lump/mass.   Eyes: Negative for eye problems and icterus.  Respiratory: Positive for cough (related to post nasal drip). Negative for chest tightness and shortness of breath.   Cardiovascular: Negative for chest pain, leg swelling and palpitations.  Gastrointestinal: Negative for abdominal distention, abdominal pain, constipation, diarrhea, nausea and vomiting.  Endocrine: Negative for hot flashes.  Genitourinary: Negative for difficulty urinating.   Musculoskeletal: Negative for arthralgias.  Skin: Negative for itching and rash.  Neurological: Negative for dizziness, extremity weakness, headaches and numbness.  Hematological: Negative for adenopathy. Does not bruise/bleed easily.  Psychiatric/Behavioral: Negative for depression. The patient is not nervous/anxious.   Breast: Denies any new nodularity, masses, tenderness, nipple changes, or nipple discharge.       PAST MEDICAL/SURGICAL HISTORY:  Past Medical History:  Diagnosis Date  . Allergy   . Anxiety    takes Ativan daily prn  . Arthritis   . Breast cancer ([place]) 2014   ER+/PR+/HEr2-,   . Bruises easily   . Colon polyps    2 polyps by report  . Diverticulosis   . GERD (gastroesophageal reflux disease)    occasionally takes Nexium   .  Gout    takes Allopurinol daily and Colchicine daily prn;last attack 91yr ago  . Gout   . History of bladder infections    many yrs ago  . History of bronchitis    last time at least 881yrago  . History of stress incontinence   . Hyperlipidemia    takes Pravastatin daily  . Insomnia    takes Ambien nightly prn  . OSA (obstructive sleep apnea)    on CPAP  . Osteoarthritis   . Peripheral edema    takes Furosemide daily prn  . PONV (postoperative nausea and vomiting)   . Postmenopausal  hormone therapy   . Radiation 07/27/13-09/07/13   Right Breast x 31 treatments  . Rhinitis    uses Flonase prn  . Sinus congestion   . Status post breast reconstruction 10/15/14   [place]  . Tachycardia    takes Metoprolol daily  . Ventricular tachycardia, non-sustained ([facility]   during sleep study 2009 with normal cardiac workup   Past Surgical History:  Procedure Laterality Date  . ABDOMINAL HYSTERECTOMY    . APPENDECTOMY    . AXILLARY SENTINEL NODE BIOPSY Right 05/23/2013   [facility] BILATERAL BREAST RECONSTRUCTION WITH PLACEMENT OF TISSUE EXPANDER AND FLEX HD Bilateral 05/23/2013   [facility]. bilateral [facility]    . BREAST RECONSTRUCTION Bilateral 05/17/2014   W  . CARDIAC CATHETERIZATION  1999  . CHOLECYSTECTOMY    . COLONOSCOPY    . COSMETIC SURGERY    . GALLBLADDER SURGERY    . REMOVAL OF BILATERAL TISSUE EXPANDERS WITH PLACEMENT OF BILATERAL BREAST IMPLANTS FOR RECONSTRUCTION Bilateral 05/17/2014   [facility] TONSILLECTOMY    . TOTAL MASTECTOMY Bilateral 05/23/2013   WITH RECONSTRUCTION  . TOTAL MASTECTOMY Bilateral 05/23/2013   [facility]  . urinary tightening     d/t urinary incontinence     ALLERGIES:  Allergies  Allergen Reactions  . Ivp Dye [Iodinated Diagnostic Agents] Hives  . Sulfa Antibiotics Swelling     CURRENT MEDICATIONS:  Outpatient Encounter Medications as of 07/02/2017  Medication Sig Note  . allopurinol (ZYLOPRIM) 100 MG tablet TAKE 1 TABLET BY MOUTH DAILY   . allopurinol (ZYLOPRIM) 100 MG tablet TAKE 1 TABLET BY MOUTH ONCE DAILY   . anastrozole (ARIMIDEX) 1 MG tablet Take 1 tablet (1 mg total) by mouth daily.   . celecoxib (CELEBREX) 200 MG capsule TK ONE C PO QD 12/23/2015: Received from: External Pharmacy  . cholecalciferol 5000 UNITS TABS Take 0.2  tablets (1,000 Units total) by mouth daily.   . colchicine 0.6 MG tablet TAKE 1 TABLET BY MOUTH EVERY DAY OR AS NEEDED   . Cyanocobalamin (VITAMIN B-12 IJ) Inject 1,000 mcg as directed every 30 (thirty) days. Normally 1st of the month   . cyclobenzaprine (FLEXERIL) 10 MG tablet TK 1 T PO Q 8 H PRF MUSCLE SPASMS 05/17/2015: Received from: External Pharmacy  . esomeprazole (NEXIUM) 40 MG capsule TAKE ONE CAPSULE BY MOUTH EVERY DAY AT NOON   . fluticasone (FLONASE) 50 MCG/ACT nasal spray Place 1 spray into the nose daily as needed for rhinitis.    . furosemide (LASIX) 40 MG tablet Take 40 mg by mouth daily as needed for fluid.    . Marland KitchenORazepam (ATIVAN) 0.5 MG tablet  Take 0.5 mg by mouth at bedtime as needed for anxiety (or sleep).   . menthol-cetylpyridinium (CEPACOL) 3 MG lozenge Take 1 lozenge (3 mg total) by mouth as needed for sore throat.   . pravastatin (PRAVACHOL) 40 MG tablet TAKE 1 TABLET BY MOUTH ONCE DAILY   . Triamcinolone Acetonide (TRIAMCINOLONE 0.1 % CREAM : EUCERIN) CREA Apply 1 application topically 3 (three) times daily.   Marland Kitchen venlafaxine XR (EFFEXOR-XR) 150 MG 24 hr capsule TAKE 1 CAPSULE BY MOUTH DAILY WITH BREAKFAST   . venlafaxine XR (EFFEXOR-XR) 37.5 MG 24 hr capsule Take 1 capsule (37.5 mg total) by mouth daily.   . tamoxifen (NOLVADEX) 20 MG tablet Take 1 tablet (20 mg total) daily by mouth.    No facility-administered encounter medications on file as of 07/02/2017.      ONCOLOGIC FAMILY HISTORY:  Family History  Problem Relation Age of Onset  . Breast cancer Mother        possible inflammatory breast cancer  . Heart attack Father   . Heart disease Father   . ALS Sister   . Breast cancer Maternal Grandmother 1  . Heart attack Maternal Grandfather   . Heart attack Paternal Grandfather   . Breast cancer Maternal Aunt        dx in her 46s  . Breast cancer Other        maternal great grandmother; dx in her 62s    GENETIC COUNSELING/TESTING: BRCA 1 and 2  negative    PHYSICAL EXAMINATION:  Vital Signs: Vitals:   07/02/17 1026  BP: (!) 154/77  Pulse: 100  Resp: 18  Temp: 98 F (36.7 C)  SpO2: 100%   Filed Weights   07/02/17 1026  Weight: 154 lb 4.8 oz (70 kg)   General: Well-nourished, well-appearing female in no acute distress.  Unaccompanied today.   HEENT: Head is normocephalic.  Pupils equal and reactive to light. Conjunctivae clear without exudate.  Sclerae anicteric. Oral mucosa is pink, moist.  Oropharynx is pink without lesions or erythema.  Lymph: No cervical, supraclavicular, or infraclavicular lymphadenopathy noted on palpation.  Cardiovascular: Regular rate and rhythm.Marland Kitchen Respiratory: Clear to auscultation bilaterally. Chest expansion symmetric; breathing non-labored.  Breast Exam:  S/p bilateral mastectomy and reconstruction, no swelling nodules masses noted, just above the right breast there is a slight difference in fullness, recommended massage and to notify me if it gets worse and we will refer to PT -Axilla: No axillary adenopathy bilaterally.  GI: Abdomen soft and round; non-tender, non-distended. Bowel sounds normoactive. No hepatosplenomegaly.   GU: Deferred.  Neuro: No focal deficits. Steady gait.  Psych: Mood and affect normal and appropriate for situation.  MSK: No focal spinal tenderness to palpation, full range of motion in bilateral upper extremities Extremities: No edema. Skin: Warm and dry.  LABORATORY DATA:  None for this visit   DIAGNOSTIC IMAGING:  Breast MRI:     ASSESSMENT AND PLAN:  Tracey [name] is a pleasant 71 y.o. female with history of Stage IA right breast invasive ductal carcinoma, ER+/PR+/HER2-, diagnosed in 2014, treated with mastectomies, adjuvant radiation therapy, and anti-estrogen therapy with Anastrozole x 4 years.  She presents to the [facility] for surveillance and routine follow-up.   1. History of breast cancer:  Tracey [name] is currently clinically and  radiographically without evidence of disease or recurrence of breast cancer.  Her MRI on 11/7 showed no concern for recurrence.  She will return to the cancer center to see her medical oncologist, Dr.  Magrinat in 6 months.  She will be in [place] until 11/2017.  I encouraged her to call me with any questions or concerns before her next visit at the cancer center, and I would be happy to see her sooner, if needed.    2. Difficulty with Anastrozole: She has had a tremendous difficulty with Anastrozole, due to this she stopped and was much better.  I reviewed her case with Dr.Magrinat, and talked through risks and benefits of Tamoxifen and Letrozole.  We all agreed that Tamoxifen is the best choice for her.  I discussed this with her and sent in a months supply.    3. Bone health:  Given Tracey [name] age, history of breast cancer, and her current anti-estrogen therapy with Anastrozole, she is at risk for bone demineralization. Her last DEXA scan was on 02/03/2016 and was normal.  She will be due for repeat bone density in 2 years. She was given education on specific food and activities to promote bone health.  4. Cancer screening:  Due to Tracey [name] history and her age, she should receive screening for skin cancers, colon cancer, and gynecologic cancers. She was encouraged to follow-up with her PCP for appropriate cancer screenings.   5. Health maintenance and wellness promotion: Tracey [name] was encouraged to consume 5-7 servings of fruits and vegetables per day. She was also encouraged to engage in moderate to vigorous exercise for 30 minutes per day most days of the week. She was instructed to limit her alcohol consumption and continue to abstain from tobacco use.      Dispo:  -Return to cancer center in 6 months for follow up with Dr. Jana [name]   A total of (30) minutes of face-to-face time was spent with this patient with greater than 50% of that time in counseling and care-coordination.   Gardenia Phlegm, NP Survivorship Program [facility] [phone]   Note: PRIMARY CARE PROVIDER [name], [place] [phone]

## 2017-07-02 NOTE — Telephone Encounter (Signed)
Gave patient calendar with appts per 11/16 los.

## 2017-07-14 DIAGNOSIS — Z853 Personal history of malignant neoplasm of breast: Secondary | ICD-10-CM | POA: Diagnosis not present

## 2017-07-14 DIAGNOSIS — Z79890 Hormone replacement therapy (postmenopausal): Secondary | ICD-10-CM | POA: Diagnosis not present

## 2017-07-19 ENCOUNTER — Telehealth: Payer: Self-pay | Admitting: Neurology

## 2017-07-19 DIAGNOSIS — R0902 Hypoxemia: Secondary | ICD-10-CM

## 2017-07-19 NOTE — Telephone Encounter (Signed)
Called the patient to make her aware that Dr Brett Fairy is personally reaching out to [place] to make sure this is getting handled.  After speaking with the [employer] they have instructed [place] to place orders and they will take of this for her. Pt verbalized understanding.

## 2017-07-19 NOTE — Telephone Encounter (Signed)
Pt called said she now in [facility]. She said last OV Dr D discussed adding oxygen to CPAP since elevation is higher there. She said some other cities close by are [place] and [place], [place] is 100 miles away so she would not want anything called there. She had it before thru a place called [facility], [place] maybe around there. Please call to advise.

## 2017-07-28 ENCOUNTER — Other Ambulatory Visit: Payer: Self-pay | Admitting: Oncology

## 2017-07-28 NOTE — Telephone Encounter (Signed)
I have contacted Jonni Sanger with [employer] to get this set up for the patient. I have forwarded this message with the contact people she mentioned to help with getting her set up

## 2017-07-28 NOTE — Telephone Encounter (Signed)
Pt called she has not heard from [place]. She has used [employer], [place] (p) [phone] in the past. She has contacted them and they are requesting an order to be sent to (f) [phone]. When calling please ask for Aldona Bar or Corene Cornea, they are her contacts.

## 2017-08-02 NOTE — Telephone Encounter (Signed)
Called the patient and made her aware that I have contacted Corene Cornea he is with [employer]. This is the number the patient has provided for [place] and the people that she has dealt with in the past that have helped her. I have sent the orders to them and they will contact her. Pt verbalized understanding and was appreciative.

## 2017-08-02 NOTE — Telephone Encounter (Signed)
Pt has called to inform that she has not heard from anyone at [facility] nor has [facility] been contacted by anyone.  Pt states they ([facility] )are just waiting to be contacted so that they can take over for her.  Pt is asking to be called please

## 2017-08-03 ENCOUNTER — Ambulatory Visit: Payer: Self-pay | Admitting: Oncology

## 2017-08-17 DIAGNOSIS — I214 Non-ST elevation (NSTEMI) myocardial infarction: Secondary | ICD-10-CM

## 2017-08-17 HISTORY — DX: Non-ST elevation (NSTEMI) myocardial infarction: I21.4

## 2017-08-17 HISTORY — PX: PERCUTANEOUS CORONARY STENT INTERVENTION (PCI-S): SHX6016

## 2017-09-06 DIAGNOSIS — M1711 Unilateral primary osteoarthritis, right knee: Secondary | ICD-10-CM | POA: Diagnosis not present

## 2017-09-06 DIAGNOSIS — M25562 Pain in left knee: Secondary | ICD-10-CM | POA: Diagnosis not present

## 2017-09-06 DIAGNOSIS — M5441 Lumbago with sciatica, right side: Secondary | ICD-10-CM | POA: Diagnosis not present

## 2017-09-06 DIAGNOSIS — M4726 Other spondylosis with radiculopathy, lumbar region: Secondary | ICD-10-CM | POA: Diagnosis not present

## 2017-09-06 DIAGNOSIS — M1712 Unilateral primary osteoarthritis, left knee: Secondary | ICD-10-CM | POA: Diagnosis not present

## 2017-09-06 DIAGNOSIS — M4316 Spondylolisthesis, lumbar region: Secondary | ICD-10-CM | POA: Diagnosis not present

## 2017-09-06 DIAGNOSIS — M25561 Pain in right knee: Secondary | ICD-10-CM | POA: Diagnosis not present

## 2017-09-08 ENCOUNTER — Other Ambulatory Visit: Payer: Self-pay | Admitting: Oncology

## 2017-09-08 DIAGNOSIS — M5116 Intervertebral disc disorders with radiculopathy, lumbar region: Secondary | ICD-10-CM | POA: Diagnosis not present

## 2017-09-08 DIAGNOSIS — M4316 Spondylolisthesis, lumbar region: Secondary | ICD-10-CM | POA: Diagnosis not present

## 2017-09-08 DIAGNOSIS — M1711 Unilateral primary osteoarthritis, right knee: Secondary | ICD-10-CM | POA: Diagnosis not present

## 2017-09-08 DIAGNOSIS — M1712 Unilateral primary osteoarthritis, left knee: Secondary | ICD-10-CM | POA: Diagnosis not present

## 2017-09-08 DIAGNOSIS — R262 Difficulty in walking, not elsewhere classified: Secondary | ICD-10-CM | POA: Diagnosis not present

## 2017-09-09 DIAGNOSIS — H02403 Unspecified ptosis of bilateral eyelids: Secondary | ICD-10-CM | POA: Diagnosis not present

## 2017-09-09 DIAGNOSIS — L578 Other skin changes due to chronic exposure to nonionizing radiation: Secondary | ICD-10-CM | POA: Diagnosis not present

## 2017-09-09 DIAGNOSIS — L738 Other specified follicular disorders: Secondary | ICD-10-CM | POA: Diagnosis not present

## 2017-09-09 DIAGNOSIS — D492 Neoplasm of unspecified behavior of bone, soft tissue, and skin: Secondary | ICD-10-CM | POA: Diagnosis not present

## 2017-09-09 DIAGNOSIS — D225 Melanocytic nevi of trunk: Secondary | ICD-10-CM | POA: Diagnosis not present

## 2017-09-10 DIAGNOSIS — M1712 Unilateral primary osteoarthritis, left knee: Secondary | ICD-10-CM | POA: Diagnosis not present

## 2017-09-10 DIAGNOSIS — M1711 Unilateral primary osteoarthritis, right knee: Secondary | ICD-10-CM | POA: Diagnosis not present

## 2017-09-10 DIAGNOSIS — R262 Difficulty in walking, not elsewhere classified: Secondary | ICD-10-CM | POA: Diagnosis not present

## 2017-09-13 DIAGNOSIS — R262 Difficulty in walking, not elsewhere classified: Secondary | ICD-10-CM | POA: Diagnosis not present

## 2017-09-13 DIAGNOSIS — M1712 Unilateral primary osteoarthritis, left knee: Secondary | ICD-10-CM | POA: Diagnosis not present

## 2017-09-13 DIAGNOSIS — M1711 Unilateral primary osteoarthritis, right knee: Secondary | ICD-10-CM | POA: Diagnosis not present

## 2017-09-15 DIAGNOSIS — Z79811 Long term (current) use of aromatase inhibitors: Secondary | ICD-10-CM | POA: Diagnosis not present

## 2017-09-15 DIAGNOSIS — I213 ST elevation (STEMI) myocardial infarction of unspecified site: Secondary | ICD-10-CM | POA: Diagnosis not present

## 2017-09-15 DIAGNOSIS — M109 Gout, unspecified: Secondary | ICD-10-CM | POA: Diagnosis not present

## 2017-09-15 DIAGNOSIS — Z87891 Personal history of nicotine dependence: Secondary | ICD-10-CM | POA: Diagnosis not present

## 2017-09-15 DIAGNOSIS — G4733 Obstructive sleep apnea (adult) (pediatric): Secondary | ICD-10-CM | POA: Diagnosis not present

## 2017-09-15 DIAGNOSIS — I25119 Atherosclerotic heart disease of native coronary artery with unspecified angina pectoris: Secondary | ICD-10-CM | POA: Diagnosis not present

## 2017-09-15 DIAGNOSIS — J069 Acute upper respiratory infection, unspecified: Secondary | ICD-10-CM | POA: Diagnosis not present

## 2017-09-15 DIAGNOSIS — R0789 Other chest pain: Secondary | ICD-10-CM | POA: Diagnosis not present

## 2017-09-15 DIAGNOSIS — I2510 Atherosclerotic heart disease of native coronary artery without angina pectoris: Secondary | ICD-10-CM | POA: Diagnosis not present

## 2017-09-15 DIAGNOSIS — I10 Essential (primary) hypertension: Secondary | ICD-10-CM | POA: Diagnosis not present

## 2017-09-15 DIAGNOSIS — Z743 Need for continuous supervision: Secondary | ICD-10-CM | POA: Diagnosis not present

## 2017-09-15 DIAGNOSIS — I4891 Unspecified atrial fibrillation: Secondary | ICD-10-CM | POA: Diagnosis not present

## 2017-09-15 DIAGNOSIS — Z8249 Family history of ischemic heart disease and other diseases of the circulatory system: Secondary | ICD-10-CM | POA: Diagnosis not present

## 2017-09-15 DIAGNOSIS — E870 Hyperosmolality and hypernatremia: Secondary | ICD-10-CM | POA: Diagnosis not present

## 2017-09-15 DIAGNOSIS — R079 Chest pain, unspecified: Secondary | ICD-10-CM | POA: Diagnosis not present

## 2017-09-15 DIAGNOSIS — M1990 Unspecified osteoarthritis, unspecified site: Secondary | ICD-10-CM | POA: Diagnosis not present

## 2017-09-15 DIAGNOSIS — I2102 ST elevation (STEMI) myocardial infarction involving left anterior descending coronary artery: Secondary | ICD-10-CM | POA: Diagnosis not present

## 2017-09-15 DIAGNOSIS — K210 Gastro-esophageal reflux disease with esophagitis: Secondary | ICD-10-CM | POA: Diagnosis not present

## 2017-09-15 DIAGNOSIS — R51 Headache: Secondary | ICD-10-CM | POA: Diagnosis not present

## 2017-09-15 DIAGNOSIS — I499 Cardiac arrhythmia, unspecified: Secondary | ICD-10-CM | POA: Diagnosis not present

## 2017-09-15 DIAGNOSIS — Z9013 Acquired absence of bilateral breasts and nipples: Secondary | ICD-10-CM | POA: Diagnosis not present

## 2017-09-15 DIAGNOSIS — Z923 Personal history of irradiation: Secondary | ICD-10-CM | POA: Diagnosis not present

## 2017-09-15 DIAGNOSIS — R110 Nausea: Secondary | ICD-10-CM | POA: Diagnosis not present

## 2017-09-15 DIAGNOSIS — I214 Non-ST elevation (NSTEMI) myocardial infarction: Secondary | ICD-10-CM | POA: Diagnosis not present

## 2017-09-15 DIAGNOSIS — K219 Gastro-esophageal reflux disease without esophagitis: Secondary | ICD-10-CM | POA: Diagnosis not present

## 2017-09-15 DIAGNOSIS — E785 Hyperlipidemia, unspecified: Secondary | ICD-10-CM | POA: Diagnosis not present

## 2017-09-15 DIAGNOSIS — Z853 Personal history of malignant neoplasm of breast: Secondary | ICD-10-CM | POA: Diagnosis not present

## 2017-09-15 DIAGNOSIS — R05 Cough: Secondary | ICD-10-CM | POA: Diagnosis not present

## 2017-09-15 DIAGNOSIS — Z79899 Other long term (current) drug therapy: Secondary | ICD-10-CM | POA: Diagnosis not present

## 2017-09-16 DIAGNOSIS — M109 Gout, unspecified: Secondary | ICD-10-CM | POA: Diagnosis not present

## 2017-09-16 DIAGNOSIS — Z9013 Acquired absence of bilateral breasts and nipples: Secondary | ICD-10-CM | POA: Diagnosis not present

## 2017-09-16 DIAGNOSIS — Z853 Personal history of malignant neoplasm of breast: Secondary | ICD-10-CM | POA: Diagnosis not present

## 2017-09-16 DIAGNOSIS — E785 Hyperlipidemia, unspecified: Secondary | ICD-10-CM | POA: Diagnosis not present

## 2017-09-16 DIAGNOSIS — I2510 Atherosclerotic heart disease of native coronary artery without angina pectoris: Secondary | ICD-10-CM | POA: Diagnosis present

## 2017-09-16 DIAGNOSIS — Z882 Allergy status to sulfonamides status: Secondary | ICD-10-CM | POA: Diagnosis not present

## 2017-09-16 DIAGNOSIS — Z91041 Radiographic dye allergy status: Secondary | ICD-10-CM | POA: Diagnosis not present

## 2017-09-16 DIAGNOSIS — E870 Hyperosmolality and hypernatremia: Secondary | ICD-10-CM | POA: Diagnosis not present

## 2017-09-16 DIAGNOSIS — R0600 Dyspnea, unspecified: Secondary | ICD-10-CM | POA: Diagnosis not present

## 2017-09-16 DIAGNOSIS — R600 Localized edema: Secondary | ICD-10-CM | POA: Diagnosis present

## 2017-09-16 DIAGNOSIS — M1990 Unspecified osteoarthritis, unspecified site: Secondary | ICD-10-CM | POA: Diagnosis present

## 2017-09-16 DIAGNOSIS — K219 Gastro-esophageal reflux disease without esophagitis: Secondary | ICD-10-CM | POA: Diagnosis present

## 2017-09-16 DIAGNOSIS — E876 Hypokalemia: Secondary | ICD-10-CM | POA: Diagnosis present

## 2017-09-16 DIAGNOSIS — I213 ST elevation (STEMI) myocardial infarction of unspecified site: Secondary | ICD-10-CM | POA: Diagnosis not present

## 2017-09-16 DIAGNOSIS — G4733 Obstructive sleep apnea (adult) (pediatric): Secondary | ICD-10-CM | POA: Diagnosis present

## 2017-09-16 DIAGNOSIS — F419 Anxiety disorder, unspecified: Secondary | ICD-10-CM | POA: Diagnosis present

## 2017-09-16 DIAGNOSIS — I214 Non-ST elevation (NSTEMI) myocardial infarction: Secondary | ICD-10-CM | POA: Diagnosis not present

## 2017-09-16 DIAGNOSIS — F329 Major depressive disorder, single episode, unspecified: Secondary | ICD-10-CM | POA: Diagnosis present

## 2017-09-17 HISTORY — PX: OTHER SURGICAL HISTORY: SHX169

## 2017-09-17 HISTORY — PX: CYSTECTOMY: SUR359

## 2017-09-23 DIAGNOSIS — I214 Non-ST elevation (NSTEMI) myocardial infarction: Secondary | ICD-10-CM | POA: Diagnosis not present

## 2017-09-24 DIAGNOSIS — Z01810 Encounter for preprocedural cardiovascular examination: Secondary | ICD-10-CM | POA: Diagnosis not present

## 2017-09-24 DIAGNOSIS — Z7982 Long term (current) use of aspirin: Secondary | ICD-10-CM | POA: Diagnosis not present

## 2017-09-24 DIAGNOSIS — Z87891 Personal history of nicotine dependence: Secondary | ICD-10-CM | POA: Diagnosis not present

## 2017-09-24 DIAGNOSIS — I10 Essential (primary) hypertension: Secondary | ICD-10-CM | POA: Diagnosis not present

## 2017-09-24 DIAGNOSIS — I2510 Atherosclerotic heart disease of native coronary artery without angina pectoris: Secondary | ICD-10-CM | POA: Diagnosis not present

## 2017-09-24 DIAGNOSIS — E785 Hyperlipidemia, unspecified: Secondary | ICD-10-CM | POA: Diagnosis not present

## 2017-09-24 DIAGNOSIS — Z79899 Other long term (current) drug therapy: Secondary | ICD-10-CM | POA: Diagnosis not present

## 2017-09-24 DIAGNOSIS — G4733 Obstructive sleep apnea (adult) (pediatric): Secondary | ICD-10-CM | POA: Diagnosis not present

## 2017-09-24 DIAGNOSIS — I4891 Unspecified atrial fibrillation: Secondary | ICD-10-CM | POA: Diagnosis not present

## 2017-09-27 DIAGNOSIS — J029 Acute pharyngitis, unspecified: Secondary | ICD-10-CM | POA: Diagnosis not present

## 2017-09-27 DIAGNOSIS — R05 Cough: Secondary | ICD-10-CM | POA: Diagnosis not present

## 2017-09-27 DIAGNOSIS — R000 Tachycardia, unspecified: Secondary | ICD-10-CM | POA: Diagnosis not present

## 2017-09-27 DIAGNOSIS — R509 Fever, unspecified: Secondary | ICD-10-CM | POA: Diagnosis not present

## 2017-09-27 DIAGNOSIS — F329 Major depressive disorder, single episode, unspecified: Secondary | ICD-10-CM | POA: Diagnosis not present

## 2017-09-27 DIAGNOSIS — R51 Headache: Secondary | ICD-10-CM | POA: Diagnosis not present

## 2017-09-27 DIAGNOSIS — J018 Other acute sinusitis: Secondary | ICD-10-CM | POA: Diagnosis not present

## 2017-09-30 DIAGNOSIS — Z79899 Other long term (current) drug therapy: Secondary | ICD-10-CM | POA: Diagnosis not present

## 2017-09-30 DIAGNOSIS — Z87891 Personal history of nicotine dependence: Secondary | ICD-10-CM | POA: Diagnosis not present

## 2017-09-30 DIAGNOSIS — Z7982 Long term (current) use of aspirin: Secondary | ICD-10-CM | POA: Diagnosis not present

## 2017-09-30 DIAGNOSIS — Z7902 Long term (current) use of antithrombotics/antiplatelets: Secondary | ICD-10-CM | POA: Diagnosis not present

## 2017-09-30 DIAGNOSIS — I214 Non-ST elevation (NSTEMI) myocardial infarction: Secondary | ICD-10-CM | POA: Diagnosis not present

## 2017-09-30 DIAGNOSIS — E785 Hyperlipidemia, unspecified: Secondary | ICD-10-CM | POA: Diagnosis not present

## 2017-10-12 DIAGNOSIS — F329 Major depressive disorder, single episode, unspecified: Secondary | ICD-10-CM | POA: Diagnosis not present

## 2017-10-12 DIAGNOSIS — M5416 Radiculopathy, lumbar region: Secondary | ICD-10-CM | POA: Diagnosis not present

## 2017-10-12 DIAGNOSIS — K219 Gastro-esophageal reflux disease without esophagitis: Secondary | ICD-10-CM | POA: Diagnosis not present

## 2017-10-13 DIAGNOSIS — I214 Non-ST elevation (NSTEMI) myocardial infarction: Secondary | ICD-10-CM | POA: Diagnosis not present

## 2017-10-13 DIAGNOSIS — M109 Gout, unspecified: Secondary | ICD-10-CM | POA: Diagnosis not present

## 2017-10-15 DIAGNOSIS — M109 Gout, unspecified: Secondary | ICD-10-CM | POA: Diagnosis not present

## 2017-10-15 DIAGNOSIS — M5116 Intervertebral disc disorders with radiculopathy, lumbar region: Secondary | ICD-10-CM | POA: Diagnosis not present

## 2017-10-15 DIAGNOSIS — I214 Non-ST elevation (NSTEMI) myocardial infarction: Secondary | ICD-10-CM | POA: Diagnosis not present

## 2017-10-15 DIAGNOSIS — M48061 Spinal stenosis, lumbar region without neurogenic claudication: Secondary | ICD-10-CM | POA: Diagnosis not present

## 2017-10-18 DIAGNOSIS — Z79899 Other long term (current) drug therapy: Secondary | ICD-10-CM | POA: Diagnosis not present

## 2017-10-18 DIAGNOSIS — Z7982 Long term (current) use of aspirin: Secondary | ICD-10-CM | POA: Diagnosis not present

## 2017-10-18 DIAGNOSIS — Z7902 Long term (current) use of antithrombotics/antiplatelets: Secondary | ICD-10-CM | POA: Diagnosis not present

## 2017-10-18 DIAGNOSIS — I2510 Atherosclerotic heart disease of native coronary artery without angina pectoris: Secondary | ICD-10-CM | POA: Diagnosis not present

## 2017-10-18 DIAGNOSIS — E785 Hyperlipidemia, unspecified: Secondary | ICD-10-CM | POA: Diagnosis not present

## 2017-10-18 DIAGNOSIS — I252 Old myocardial infarction: Secondary | ICD-10-CM | POA: Diagnosis not present

## 2017-10-18 DIAGNOSIS — I10 Essential (primary) hypertension: Secondary | ICD-10-CM | POA: Diagnosis not present

## 2017-10-18 DIAGNOSIS — Z87891 Personal history of nicotine dependence: Secondary | ICD-10-CM | POA: Diagnosis not present

## 2017-10-20 DIAGNOSIS — I214 Non-ST elevation (NSTEMI) myocardial infarction: Secondary | ICD-10-CM | POA: Diagnosis not present

## 2017-10-20 DIAGNOSIS — M109 Gout, unspecified: Secondary | ICD-10-CM | POA: Diagnosis not present

## 2017-10-21 DIAGNOSIS — M5416 Radiculopathy, lumbar region: Secondary | ICD-10-CM | POA: Diagnosis not present

## 2017-10-21 DIAGNOSIS — M5441 Lumbago with sciatica, right side: Secondary | ICD-10-CM | POA: Diagnosis not present

## 2017-10-21 DIAGNOSIS — M4316 Spondylolisthesis, lumbar region: Secondary | ICD-10-CM | POA: Diagnosis not present

## 2017-10-21 DIAGNOSIS — M47817 Spondylosis without myelopathy or radiculopathy, lumbosacral region: Secondary | ICD-10-CM | POA: Diagnosis not present

## 2017-10-21 DIAGNOSIS — M48061 Spinal stenosis, lumbar region without neurogenic claudication: Secondary | ICD-10-CM | POA: Diagnosis not present

## 2017-10-22 DIAGNOSIS — I214 Non-ST elevation (NSTEMI) myocardial infarction: Secondary | ICD-10-CM | POA: Diagnosis not present

## 2017-10-22 DIAGNOSIS — M109 Gout, unspecified: Secondary | ICD-10-CM | POA: Diagnosis not present

## 2017-10-25 DIAGNOSIS — M48061 Spinal stenosis, lumbar region without neurogenic claudication: Secondary | ICD-10-CM | POA: Diagnosis not present

## 2017-10-25 DIAGNOSIS — I10 Essential (primary) hypertension: Secondary | ICD-10-CM | POA: Diagnosis not present

## 2017-10-25 DIAGNOSIS — M5416 Radiculopathy, lumbar region: Secondary | ICD-10-CM | POA: Diagnosis not present

## 2017-10-25 DIAGNOSIS — M1990 Unspecified osteoarthritis, unspecified site: Secondary | ICD-10-CM | POA: Diagnosis not present

## 2017-10-25 DIAGNOSIS — M5417 Radiculopathy, lumbosacral region: Secondary | ICD-10-CM | POA: Diagnosis not present

## 2017-10-25 DIAGNOSIS — M4316 Spondylolisthesis, lumbar region: Secondary | ICD-10-CM | POA: Diagnosis not present

## 2017-10-25 DIAGNOSIS — M4727 Other spondylosis with radiculopathy, lumbosacral region: Secondary | ICD-10-CM | POA: Diagnosis not present

## 2017-10-25 DIAGNOSIS — M7138 Other bursal cyst, other site: Secondary | ICD-10-CM | POA: Diagnosis not present

## 2017-10-25 DIAGNOSIS — E7800 Pure hypercholesterolemia, unspecified: Secondary | ICD-10-CM | POA: Diagnosis not present

## 2017-10-25 DIAGNOSIS — M47817 Spondylosis without myelopathy or radiculopathy, lumbosacral region: Secondary | ICD-10-CM | POA: Diagnosis not present

## 2017-11-01 DIAGNOSIS — R079 Chest pain, unspecified: Secondary | ICD-10-CM | POA: Diagnosis not present

## 2017-11-03 DIAGNOSIS — I214 Non-ST elevation (NSTEMI) myocardial infarction: Secondary | ICD-10-CM | POA: Diagnosis not present

## 2017-11-03 DIAGNOSIS — M109 Gout, unspecified: Secondary | ICD-10-CM | POA: Diagnosis not present

## 2017-11-05 DIAGNOSIS — M109 Gout, unspecified: Secondary | ICD-10-CM | POA: Diagnosis not present

## 2017-11-05 DIAGNOSIS — I214 Non-ST elevation (NSTEMI) myocardial infarction: Secondary | ICD-10-CM | POA: Diagnosis not present

## 2017-11-08 DIAGNOSIS — I214 Non-ST elevation (NSTEMI) myocardial infarction: Secondary | ICD-10-CM | POA: Diagnosis not present

## 2017-11-08 DIAGNOSIS — M109 Gout, unspecified: Secondary | ICD-10-CM | POA: Diagnosis not present

## 2017-11-10 ENCOUNTER — Telehealth (HOSPITAL_COMMUNITY): Payer: Self-pay

## 2017-11-10 DIAGNOSIS — J069 Acute upper respiratory infection, unspecified: Secondary | ICD-10-CM | POA: Diagnosis not present

## 2017-11-10 DIAGNOSIS — R197 Diarrhea, unspecified: Secondary | ICD-10-CM | POA: Diagnosis not present

## 2017-11-10 NOTE — Telephone Encounter (Signed)
Patients insurance is active and benefits verified through Medicare A/B - No co-pay, deductible amount of $185.00/$185.00 has been met, no out of pocket, 20% co-insurance, and no pre-authorization is required. Passport/reference [id number]  Patients insurance is active and benefits verified through [employer] [place] - No co-pay, no deductible, no out of pocket, no co-insurance, and no pre-authorization is required. Passport/reference [id number]

## 2017-11-11 DIAGNOSIS — M47817 Spondylosis without myelopathy or radiculopathy, lumbosacral region: Secondary | ICD-10-CM | POA: Diagnosis not present

## 2017-11-11 DIAGNOSIS — M4316 Spondylolisthesis, lumbar region: Secondary | ICD-10-CM | POA: Diagnosis not present

## 2017-11-11 DIAGNOSIS — M5441 Lumbago with sciatica, right side: Secondary | ICD-10-CM | POA: Diagnosis not present

## 2017-11-11 DIAGNOSIS — M5416 Radiculopathy, lumbar region: Secondary | ICD-10-CM | POA: Diagnosis not present

## 2017-11-11 DIAGNOSIS — M48061 Spinal stenosis, lumbar region without neurogenic claudication: Secondary | ICD-10-CM | POA: Diagnosis not present

## 2017-11-15 DIAGNOSIS — M109 Gout, unspecified: Secondary | ICD-10-CM | POA: Diagnosis not present

## 2017-11-15 DIAGNOSIS — I214 Non-ST elevation (NSTEMI) myocardial infarction: Secondary | ICD-10-CM | POA: Diagnosis not present

## 2017-11-17 DIAGNOSIS — I214 Non-ST elevation (NSTEMI) myocardial infarction: Secondary | ICD-10-CM | POA: Diagnosis not present

## 2017-11-17 DIAGNOSIS — L239 Allergic contact dermatitis, unspecified cause: Secondary | ICD-10-CM | POA: Diagnosis not present

## 2017-11-17 DIAGNOSIS — M109 Gout, unspecified: Secondary | ICD-10-CM | POA: Diagnosis not present

## 2017-11-18 DIAGNOSIS — I10 Essential (primary) hypertension: Secondary | ICD-10-CM | POA: Diagnosis not present

## 2017-11-18 DIAGNOSIS — Z79899 Other long term (current) drug therapy: Secondary | ICD-10-CM | POA: Diagnosis not present

## 2017-11-18 DIAGNOSIS — M1712 Unilateral primary osteoarthritis, left knee: Secondary | ICD-10-CM | POA: Diagnosis not present

## 2017-11-18 DIAGNOSIS — M1711 Unilateral primary osteoarthritis, right knee: Secondary | ICD-10-CM | POA: Diagnosis not present

## 2017-11-18 DIAGNOSIS — E785 Hyperlipidemia, unspecified: Secondary | ICD-10-CM | POA: Diagnosis not present

## 2017-11-18 DIAGNOSIS — Z7982 Long term (current) use of aspirin: Secondary | ICD-10-CM | POA: Diagnosis not present

## 2017-11-18 DIAGNOSIS — I2510 Atherosclerotic heart disease of native coronary artery without angina pectoris: Secondary | ICD-10-CM | POA: Diagnosis not present

## 2017-11-18 DIAGNOSIS — Z87891 Personal history of nicotine dependence: Secondary | ICD-10-CM | POA: Diagnosis not present

## 2017-11-19 DIAGNOSIS — I214 Non-ST elevation (NSTEMI) myocardial infarction: Secondary | ICD-10-CM | POA: Diagnosis not present

## 2017-11-19 DIAGNOSIS — M109 Gout, unspecified: Secondary | ICD-10-CM | POA: Diagnosis not present

## 2017-12-03 ENCOUNTER — Other Ambulatory Visit: Payer: Self-pay

## 2017-12-03 DIAGNOSIS — C50411 Malignant neoplasm of upper-outer quadrant of right female breast: Secondary | ICD-10-CM

## 2017-12-06 ENCOUNTER — Inpatient Hospital Stay: Payer: Medicare Other | Attending: Oncology

## 2017-12-06 DIAGNOSIS — Z170 Estrogen receptor positive status [ER+]: Secondary | ICD-10-CM | POA: Insufficient documentation

## 2017-12-06 DIAGNOSIS — Z923 Personal history of irradiation: Secondary | ICD-10-CM | POA: Insufficient documentation

## 2017-12-06 DIAGNOSIS — C50411 Malignant neoplasm of upper-outer quadrant of right female breast: Secondary | ICD-10-CM | POA: Insufficient documentation

## 2017-12-06 DIAGNOSIS — Z79899 Other long term (current) drug therapy: Secondary | ICD-10-CM | POA: Diagnosis not present

## 2017-12-06 DIAGNOSIS — Z79811 Long term (current) use of aromatase inhibitors: Secondary | ICD-10-CM | POA: Diagnosis not present

## 2017-12-06 DIAGNOSIS — Z87891 Personal history of nicotine dependence: Secondary | ICD-10-CM | POA: Diagnosis not present

## 2017-12-06 DIAGNOSIS — Z803 Family history of malignant neoplasm of breast: Secondary | ICD-10-CM | POA: Insufficient documentation

## 2017-12-06 LAB — CMP (CANCER CENTER ONLY)
ALK PHOS: 57 U/L (ref 40–150)
ALT: 45 U/L (ref 0–55)
ANION GAP: 13 — AB (ref 3–11)
AST: 29 U/L (ref 5–34)
Albumin: 3.7 g/dL (ref 3.5–5.0)
BILIRUBIN TOTAL: 0.4 mg/dL (ref 0.2–1.2)
BUN: 27 mg/dL — ABNORMAL HIGH (ref 7–26)
CALCIUM: 9.3 mg/dL (ref 8.4–10.4)
CO2: 17 mmol/L — AB (ref 22–29)
CREATININE: 1.29 mg/dL — AB (ref 0.60–1.10)
Chloride: 110 mmol/L — ABNORMAL HIGH (ref 98–109)
GFR, EST AFRICAN AMERICAN: 46 mL/min — AB (ref >60)
GFR, Estimated: 40 mL/min — ABNORMAL LOW (ref >60)
GLUCOSE: 114 mg/dL (ref 70–140)
Potassium: 4 mmol/L (ref 3.5–5.1)
Sodium: 140 mmol/L (ref 136–145)
TOTAL PROTEIN: 6.5 g/dL (ref 6.4–8.3)

## 2017-12-06 LAB — CBC WITH DIFFERENTIAL (CANCER CENTER ONLY)
BASOS ABS: 0 10*3/uL (ref 0.0–0.1)
BASOS PCT: 1 %
EOS ABS: 0.1 10*3/uL (ref 0.0–0.5)
Eosinophils Relative: 2 %
HEMATOCRIT: 39.1 % (ref 34.8–46.6)
Hemoglobin: 12.7 g/dL (ref 11.6–15.9)
Lymphocytes Relative: 23 %
Lymphs Abs: 1.3 10*3/uL (ref 0.9–3.3)
MCH: 30.5 pg (ref 25.1–34.0)
MCHC: 32.5 g/dL (ref 31.5–36.0)
MCV: 93.9 fL (ref 79.5–101.0)
MONO ABS: 0.5 10*3/uL (ref 0.1–0.9)
MONOS PCT: 9 %
NEUTROS ABS: 3.8 10*3/uL (ref 1.5–6.5)
Neutrophils Relative %: 65 %
PLATELETS: 155 10*3/uL (ref 145–400)
RBC: 4.16 MIL/uL (ref 3.70–5.45)
RDW: 16.8 % — ABNORMAL HIGH (ref 11.2–14.5)
WBC Count: 5.8 10*3/uL (ref 3.9–10.3)

## 2017-12-10 NOTE — Progress Notes (Signed)
ID: Tracey [name] OB: 1945/10/22  MR#: [id number]  TIW#:[id number]  PCP: [name], MD/ [name] GYN:   SU: [name] OTHER MD: [name], William Hamburger [phone])  Patient Care Team: [name], MD as PCP - General (Internal Medicine) Sherren Mocha, MD as Consulting Physician (Cardiology) Margarette Canada (Inactive) , Virgie Dad, MD as Consulting Physician (Oncology)   CHIEF COMPLAINT:right breast cancer  CURRENT TREATMENT: exemestane From the original intakenote:  Tracey [name] had routine screening mammography July of 2014 showing a suspicious mass in her right breast. Additional views confirmed a mass in the upper outer quadrant of the right breast, which by ultrasound measured 4 mm. Biopsy of this mass was obtained in [place], at [facility]. 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  The patient's subsequent history is as detailed below  INTERVAL HISTORY: Tracey [name] returns today for follow-up and treatment of her estrogen receptor positive breast cancer. She continues on tamoxifen, with good tolernace Initially, she had frequent hot flashes, but this has improved over time. For the most part, her arthralgias and myalgias are gone. She denies issues with vaginal dryness.  Since her last visit, she completed a bilateral breast MRI on 06/22/2017 showing: breast density category A. There was no MRI evidence of malignancy.    REVIEW OF SYSTEMS: Tracey [name] reports that she had a heart attack at the end of January 2019. She was in [place], [place] at the time. She had 2 stents placed. She is taking Plavix, aspirin, metoprolol, lipitor, and lisinopril. She has established herself locally with Dr. Sherren Mocha. She started cardiac rehabilitation. She denies unusual headaches, visual changes, nausea, vomiting, or dizziness.  There has been no unusual cough, phlegm production, or pleurisy. This been no change in bowel or bladder habits. She denies unexplained fatigue or unexplained weight loss, bleeding, rash, or fever. A detailed review of systems was otherwise stable.    PAST MEDICAL HISTORY: Past Medical History:  Diagnosis Date  . Allergy   . Anxiety    takes Ativan daily prn  . Arthritis   . Breast cancer ([place]) 2014   ER+/PR+/HEr2-,   . Bruises easily   . Colon polyps    2 polyps by report  . Diverticulosis   . GERD (gastroesophageal reflux disease)    occasionally takes Nexium   . Gout    takes Allopurinol daily and Colchicine daily prn;last attack 89yr ago  . Gout   . History of bladder infections    many yrs ago  . History of bronchitis    last time at least 87yrago  . History of stress incontinence   . Hyperlipidemia    takes Pravastatin daily  . Insomnia    takes Ambien nightly prn  . OSA (obstructive sleep apnea)    on CPAP  . Osteoarthritis   . Peripheral edema    takes Furosemide daily prn  . PONV (postoperative nausea and vomiting)   . Postmenopausal hormone therapy   . Radiation 07/27/13-09/07/13   Right Breast x 31 treatments  . Rhinitis    uses Flonase prn  . Sinus congestion   . Status post breast reconstruction 10/15/14   [place]  . Tachycardia    takes Metoprolol daily  . Ventricular tachycardia, non-sustained ([place]   during sleep study 2009 with normal cardiac workup  PAST SURGICAL HISTORY: Past Surgical History:  Procedure Laterality Date  . ABDOMINAL HYSTERECTOMY    . APPENDECTOMY    . AXILLARY SENTINEL NODE BIOPSY Right 05/23/2013   Procedure: AXILLARY SENTINEL NODE BIOPSY;  Surgeon: [name], MD;  Location: [facility];  Service: General;  Laterality: Right;  nuc med injection 7:00  . bilateral cataract surgery    . BREAST RECONSTRUCTION Bilateral 05/17/2014   W  . BREAST RECONSTRUCTION WITH  PLACEMENT OF TISSUE EXPANDER AND FLEX HD (ACELLULAR HYDRATED DERMIS) Bilateral 05/23/2013   Procedure: BILATERAL BREAST RECONSTRUCTION WITH PLACEMENT OF TISSUE EXPANDER AND FLEX HD;  Surgeon: [name], MD;  Location: [facility];  Service: Plastics;  Laterality: Bilateral;  . CARDIAC CATHETERIZATION  1999  . CHOLECYSTECTOMY    . COLONOSCOPY    . COSMETIC SURGERY    . GALLBLADDER SURGERY    . REMOVAL OF BILATERAL TISSUE EXPANDERS WITH PLACEMENT OF BILATERAL BREAST IMPLANTS Bilateral 05/17/2014   Procedure: REMOVAL OF BILATERAL TISSUE EXPANDERS WITH PLACEMENT OF BILATERAL BREAST IMPLANTS FOR RECONSTRUCTION;  Surgeon: [name], MD;  Location: [facility];  Service: Plastics;  Laterality: Bilateral;  . TONSILLECTOMY    . TOTAL MASTECTOMY Bilateral 05/23/2013   WITH RECONSTRUCTION  . TOTAL MASTECTOMY Bilateral 05/23/2013   Procedure: TOTAL MASTECTOMY;  Surgeon: [name], MD;  Location: [facility];  Service: General;  Laterality: Bilateral;  . urinary tightening     d/t urinary incontinence    FAMILY HISTORY Family History  Problem Relation Age of Onset  . Breast cancer Mother        possible inflammatory breast cancer  . Heart attack Father   . Heart disease Father   . ALS Sister   . Breast cancer Maternal Grandmother 12  . Heart attack Maternal Grandfather   . Heart attack Paternal Grandfather   . Breast cancer Maternal Aunt        dx in her 76s  . Breast cancer Other        maternal great grandmother; dx in her 25s   the patient's father died from heart disease at the age of 70. The patient's mother died from breast cancer at the age of 38. It is not clear when she was diagnosed since she "dictated and". The patient had no brothers. One sister died from [place] at the age of 58 in addition, one of the patient's mother is sisters was diagnosed with breast cancer in her 49s. The patient's mother is mother, was also diagnosed with breast cancer, at the age of 42. The patient's daughter, Tracey [name], has  been tested for the BRCA genes and was negative.  GYNECOLOGIC HISTORY:  Menarche age 45, first live birth age 89, the patient is Tracey [name]. She underwent total abdominal hysterectomy and bilateral salpingo-oophorectomy at the age of 45 for endometriosis. She took hormone replacement until August of 2014.  SOCIAL HISTORY:  Tracey [name] moved to [place] 4 years ago and is planning to make this area her "home-base", although they also have a home in [place] where they go for the winter to ski. 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 They are now enjoying their retirement. Daughter [name] lives in [place]. A younger daughter [name] is a homemaker in [place]. The patient has 3 grandchildren. She is a Furniture conservator/restorer.    ADVANCED DIRECTIVES: In place   HEALTH MAINTENANCE: Social History   Tobacco Use  . Smoking  status: Former Smoker    Packs/day: 1.00    Years: 20.00    Pack years: 20.00    Start date: 06/09/1992  . Smokeless tobacco: Never Used  . Tobacco comment: quit at age 32  Substance Use Topics  . Alcohol use: Yes    Alcohol/week: 3.0 oz    Types: 5 Glasses of wine per week    Comment: nightly  . Drug use: No    Comment: quit 24 years ago     Colonoscopy: 2010  PAP: August 2014  Bone density: 2010, normal per patient  Lipid panel:  Allergies  Allergen Reactions  . Ivp Dye [Iodinated Diagnostic Agents] Hives  . Sulfa Antibiotics Swelling    Current Outpatient Medications  Medication Sig Dispense Refill  . allopurinol (ZYLOPRIM) 100 MG tablet TAKE 1 TABLET BY MOUTH DAILY 90 tablet 0  . allopurinol (ZYLOPRIM) 100 MG tablet TAKE 1 TABLET BY MOUTH ONCE DAILY 90 tablet 0  . celecoxib (CELEBREX) 200 MG capsule TK ONE C PO QD  3  . cholecalciferol 5000 UNITS TABS Take 0.2 tablets (1,000 Units total) by mouth daily. 100 tablet 3  . colchicine 0.6 MG tablet TAKE 1 TABLET BY MOUTH  EVERY DAY OR AS NEEDED 30 tablet 0  . Cyanocobalamin (VITAMIN B-12 IJ) Inject 1,000 mcg as directed every 30 (thirty) days. Normally 1st of the month    . cyclobenzaprine (FLEXERIL) 10 MG tablet TK 1 T PO Q 8 H PRF MUSCLE SPASMS  0  . esomeprazole (NEXIUM) 40 MG capsule TAKE ONE CAPSULE BY MOUTH EVERY DAY AT NOON 14 capsule 0  . exemestane (AROMASIN) 25 MG tablet Take 1 tablet (25 mg total) by mouth daily after breakfast. 90 tablet 4  . fluticasone (FLONASE) 50 MCG/ACT nasal spray Place 1 spray into the nose daily as needed for rhinitis.     . furosemide (LASIX) 40 MG tablet Take 40 mg by mouth daily as needed for fluid.     Marland Kitchen LORazepam (ATIVAN) 0.5 MG tablet Take 0.5 mg by mouth at bedtime as needed for anxiety (or sleep).    . menthol-cetylpyridinium (CEPACOL) 3 MG lozenge Take 1 lozenge (3 mg total) by mouth as needed for sore throat. 100 tablet 12  . pravastatin (PRAVACHOL) 40 MG tablet TAKE 1 TABLET BY MOUTH ONCE DAILY 90 tablet 0  . Triamcinolone Acetonide (TRIAMCINOLONE 0.1 % CREAM : EUCERIN) CREA Apply 1 application topically 3 (three) times daily. 1 each 1  . venlafaxine XR (EFFEXOR-XR) 150 MG 24 hr capsule TAKE 1 CAPSULE BY MOUTH DAILY WITH BREAKFAST 90 capsule 0  . venlafaxine XR (EFFEXOR-XR) 37.5 MG 24 hr capsule Take 1 capsule (37.5 mg total) by mouth daily. 90 capsule 3   No current facility-administered medications for this visit.     OBJECTIVE: Middle-aged white [name] who appears well  Vitals:   12/13/17 1010  BP: 119/66  Pulse: 62  Resp: 18  Temp: 98.4 F (36.9 C)  SpO2: 100%     Body mass index is 25.95 kg/m.    ECOG FS:0 - Asymptomatic Filed Weights   12/13/17 1010  Weight: 141 lb 14.4 oz (64.4 kg)    Sclerae unicteric, EOMs intact Oropharynx clear and moist No cervical or supraclavicular adenopathy Lungs no rales or rhonchi Heart regular rate and rhythm, no murmur appreciated Abd soft, nontender, positive bowel sounds MSK no focal spinal tenderness, no  upper extremity lymphedema Neuro: nonfocal, well oriented, appropriate affect Breasts: She is status post bilateral mastectomies with  D IEP reconstruction; he also underwent radiation on the right side, resulting in telangiectasias.  No evidence of local recurrence.  Both axillae are benign  LAB RESULTS:   Lab Results  Component Value Date   WBC 5.8 12/06/2017   NEUTROABS 3.8 12/06/2017   HGB 12.7 12/06/2017   HCT 39.1 12/06/2017   MCV 93.9 12/06/2017   PLT 155 12/06/2017      Chemistry      Component Value Date/Time   NA 140 12/06/2017 1016   NA 141 01/06/2017 1316   K 4.0 12/06/2017 1016   K 4.4 01/06/2017 1316   CL 110 (H) 12/06/2017 1016   CO2 17 (L) 12/06/2017 1016   CO2 23 01/06/2017 1316   BUN 27 (H) 12/06/2017 1016   BUN 23.3 01/06/2017 1316   CREATININE 1.29 (H) 12/06/2017 1016   CREATININE 0.9 01/06/2017 1316      Component Value Date/Time   CALCIUM 9.3 12/06/2017 1016   CALCIUM 10.0 01/06/2017 1316   ALKPHOS 57 12/06/2017 1016   ALKPHOS 85 01/06/2017 1316   AST 29 12/06/2017 1016   AST 27 01/06/2017 1316   ALT 45 12/06/2017 1016   ALT 40 01/06/2017 1316   BILITOT 0.4 12/06/2017 1016   BILITOT 0.47 01/06/2017 1316       STUDIES: Since her last visit, she completed a bilateral breast MRI on 06/22/2017 showing: breast density category A. There was no MRI evidence of malignancy.   ASSESSMENT: 72 y.o. BRCA 1 & 2 negative Tracey [name]   (1)  status post right breast upper outer quadrant biopsy 03/24/2013 for a pT1b cN0, stage IA invasive ductal carcinoma, grade 1, strongly estrogen and progesterone receptor positive, HER-2 negative  (2) status post bilateral mastectomies 05/23/2013 showing:  (a) on the right, a pT1b pN0, stage invasive ductal carcinoma, grade 1, again HER-2 negative  (b) on the left, no evidence of malignancy  (3) Oncotype DX score of 20 predicted a rate of distant recurrence within 10 years of 13% if the patient's only systemic  treatment was tamoxifen for 5 years.  (4) a positive margin required adjuvant radiation, completed January 2015 in [place]  (5) status post bilateral implant placement January 2015  (a) with impending right implant rupture February 2016 underwent bilateral DIEP reconstruction under Dr. Kandyce Rud in [place] (fax [phone])  (6)  started on anastrozole, mid September 2014, prior to definitive surgery; continued postop; bone density 07/31/2013 was normal  (a) repeat bone density 02/03/2016 showed a T score of -0.1 (normal  (b) switched to tamoxifen November 2018 because of arthralgias secondary to the anastrozole  (c) switched to exemestane as of 12/13/2017  PLAN: I spent approximately 30 minutes with Tracey [name] with most of that time spent discussing her new problems.   Alejandrina is now 4-1/2 years out from definitive surgery for her breast cancer with no evidence of disease recurrence.  This is very favorable.  The plan has been to continue antiestrogens for a total of 7 years.  She was originally on anastrozole, but had some arthralgias and myalgias and we switched her to tamoxifen, which she tolerated well.  However in the interim she was found to have significant coronary artery disease, and tamoxifen of course is a procoagulant, very much like estrogen.  Accordingly I am taking her off the tamoxifen.  We are starting exemestane.  This is Tracey aromatase inhibitor and aromatase inhibitors do not increase clotting issues as compared to placebo.  The issue with exemestane is that  it can cause the same type of arthralgias she had with anastrozole.  Also because this is the last of the aromatase inhibitors to go generic it can be expensive.  She will let me know if she has either cost or symptomatic problems from the exemestane.  Otherwise the plan is to see her again July 2020.  Before that visit she will have a repeat DEXA scan  I am delighted that she is doing so well as far as her heart is  concerned  She knows to call for any other issues that may develop before the next visit.  , Virgie Dad, MD  12/13/17 10:56 AM Medical Oncology and Hematology [facility] [address] [place], [place] [address] Tel. [phone]    Fax. [phone]  This document serves as a record of services personally performed by Lurline Del, MD. It was created on his behalf by Sheron Nightingale, a trained medical scribe. The creation of this record is based on the scribe's personal observations and the provider's statements to them.   I have reviewed the above documentation for accuracy and completeness, and I agree with the above.

## 2017-12-13 ENCOUNTER — Inpatient Hospital Stay (HOSPITAL_BASED_OUTPATIENT_CLINIC_OR_DEPARTMENT_OTHER): Payer: Medicare Other | Admitting: Oncology

## 2017-12-13 VITALS — BP 119/66 | HR 62 | Temp 98.4°F | Resp 18 | Ht 62.0 in | Wt 141.9 lb

## 2017-12-13 DIAGNOSIS — Z79899 Other long term (current) drug therapy: Secondary | ICD-10-CM

## 2017-12-13 DIAGNOSIS — Z803 Family history of malignant neoplasm of breast: Secondary | ICD-10-CM | POA: Diagnosis not present

## 2017-12-13 DIAGNOSIS — Z87891 Personal history of nicotine dependence: Secondary | ICD-10-CM

## 2017-12-13 DIAGNOSIS — Z79811 Long term (current) use of aromatase inhibitors: Secondary | ICD-10-CM | POA: Diagnosis not present

## 2017-12-13 DIAGNOSIS — C50411 Malignant neoplasm of upper-outer quadrant of right female breast: Secondary | ICD-10-CM

## 2017-12-13 DIAGNOSIS — Z170 Estrogen receptor positive status [ER+]: Secondary | ICD-10-CM

## 2017-12-13 DIAGNOSIS — Z923 Personal history of irradiation: Secondary | ICD-10-CM | POA: Diagnosis not present

## 2017-12-13 MED ORDER — EXEMESTANE 25 MG PO TABS: 25.0000 mg | ORAL_TABLET | Freq: Every day | ORAL | 4 refills | Status: DC

## 2017-12-13 NOTE — Addendum Note (Signed)
Addended by: Juliane Poot on: 12/13/2017 11:27 AM   Modules accepted: Orders

## 2017-12-16 ENCOUNTER — Telehealth: Payer: Self-pay | Admitting: *Deleted

## 2017-12-16 NOTE — Telephone Encounter (Signed)
Pt left message stating she would like to proceed with prescription for antiestrogen and requested for this office to notify her pharmacy for fill.  Note pt left message with [employer] stating either prescription for aromatase inhibitor that was sent to her pharmacy cost more then $200 ( 90 day supply ).  Prescription for exemestane verified at pharmacy and available for pick up.  This RN called to pt- discussed above as well as request sent to managed care for resources that could be assist with co pay.

## 2017-12-17 ENCOUNTER — Encounter: Payer: Self-pay | Admitting: Physical Therapy

## 2017-12-20 ENCOUNTER — Telehealth: Payer: Self-pay

## 2017-12-20 ENCOUNTER — Encounter: Payer: Self-pay | Admitting: Oncology

## 2017-12-20 NOTE — Telephone Encounter (Signed)
Records received.  Given to chart prep.

## 2017-12-20 NOTE — Telephone Encounter (Signed)
Per Dr. Burt Knack, scheduled patient for NP OV 5/31. Will have Medical Records request records from Dr. Mauricio Po in [place], [place] prior to visit (the patient spends 6 months of the year there). Instructed patient to bring insurance cards and medications to visit. She was grateful for call and agrees with treatment plan.

## 2017-12-20 NOTE — Progress Notes (Signed)
Left msg for pt requesting she return my call to discuss copay assistance for Exemestane.

## 2017-12-22 ENCOUNTER — Telehealth: Payer: Self-pay | Admitting: *Deleted

## 2017-12-22 NOTE — Telephone Encounter (Signed)
This RN returned call to per her VM- no answer - message left on pt's answering machine to call again.

## 2017-12-24 DIAGNOSIS — M5136 Other intervertebral disc degeneration, lumbar region: Secondary | ICD-10-CM | POA: Diagnosis not present

## 2017-12-24 DIAGNOSIS — M419 Scoliosis, unspecified: Secondary | ICD-10-CM | POA: Diagnosis not present

## 2017-12-24 DIAGNOSIS — M4696 Unspecified inflammatory spondylopathy, lumbar region: Secondary | ICD-10-CM | POA: Diagnosis not present

## 2017-12-24 DIAGNOSIS — M5416 Radiculopathy, lumbar region: Secondary | ICD-10-CM | POA: Diagnosis not present

## 2017-12-24 DIAGNOSIS — M4310 Spondylolisthesis, site unspecified: Secondary | ICD-10-CM | POA: Diagnosis not present

## 2017-12-24 DIAGNOSIS — I252 Old myocardial infarction: Secondary | ICD-10-CM | POA: Diagnosis not present

## 2017-12-27 ENCOUNTER — Encounter: Payer: Self-pay | Admitting: Oncology

## 2017-12-27 NOTE — Progress Notes (Signed)
Pt returned my call and I explained I received a msg from [place] stating she requested assistance for Aromasin.  Pt stated she can pay for it and she doesn't want to take from someone that could be in greater need.  I explained at the time I initially called there was funding available but as of today the grant has closed.  She sd if her situation changes she will call me in the future and we will see if funding becomes available at that time.

## 2018-01-06 ENCOUNTER — Encounter (HOSPITAL_COMMUNITY): Payer: Self-pay

## 2018-01-06 ENCOUNTER — Encounter (HOSPITAL_COMMUNITY)
Admission: RE | Admit: 2018-01-06 | Discharge: 2018-01-06 | Disposition: A | Discharge status: Home / Self Care | Payer: Medicare Other | Source: Ambulatory Visit | Attending: Cardiovascular Disease | Admitting: Cardiovascular Disease

## 2018-01-06 VITALS — Ht 61.5 in | Wt 144.8 lb

## 2018-01-06 DIAGNOSIS — Z79899 Other long term (current) drug therapy: Secondary | ICD-10-CM | POA: Diagnosis not present

## 2018-01-06 DIAGNOSIS — I214 Non-ST elevation (NSTEMI) myocardial infarction: Secondary | ICD-10-CM | POA: Diagnosis not present

## 2018-01-06 DIAGNOSIS — E785 Hyperlipidemia, unspecified: Secondary | ICD-10-CM | POA: Insufficient documentation

## 2018-01-06 DIAGNOSIS — Z853 Personal history of malignant neoplasm of breast: Secondary | ICD-10-CM | POA: Insufficient documentation

## 2018-01-06 DIAGNOSIS — G4733 Obstructive sleep apnea (adult) (pediatric): Secondary | ICD-10-CM | POA: Insufficient documentation

## 2018-01-06 DIAGNOSIS — Z87891 Personal history of nicotine dependence: Secondary | ICD-10-CM | POA: Diagnosis not present

## 2018-01-06 DIAGNOSIS — Z955 Presence of coronary angioplasty implant and graft: Secondary | ICD-10-CM | POA: Diagnosis not present

## 2018-01-06 DIAGNOSIS — Z7982 Long term (current) use of aspirin: Secondary | ICD-10-CM | POA: Diagnosis not present

## 2018-01-06 NOTE — Progress Notes (Signed)
Cardiac Individual Treatment Plan  Patient Details  Name: Tracey [name] MRN: [id number] Date of Birth: 17-May-1946 Referring Provider:     CARDIAC REHAB PHASE II ORIENTATION from 01/06/2018 in [place]  Referring Provider  Sherren Mocha MD      Initial Encounter Date:    CARDIAC REHAB PHASE II ORIENTATION from 01/06/2018 in [place]  Date  01/06/18  Referring Provider  Sherren Mocha MD      Visit Diagnosis: NSTEMI (non-ST elevated myocardial infarction) [facility])  Status post coronary artery stent placement  Patient's Home Medications on Admission:  Current Outpatient Medications:  .  allopurinol (ZYLOPRIM) 100 MG tablet, TAKE 1 TABLET BY MOUTH ONCE DAILY, Disp: 90 tablet, Rfl: 0 .  ASPIRIN LOW DOSE 81 MG chewable tablet, CSW 1 T PO D, Disp: , Rfl: 0 .  atorvastatin (LIPITOR) 80 MG tablet, TK 1 T PO QD, Disp: , Rfl: 11 .  celecoxib (CELEBREX) 200 MG capsule, TK ONE C PO QD, Disp: , Rfl: 3 .  cholecalciferol 5000 UNITS TABS, Take 0.2 tablets (1,000 Units total) by mouth daily., Disp: 100 tablet, Rfl: 3 .  clopidogrel (PLAVIX) 75 MG tablet, TK 1 T PO QD  TK 300 MG 4 T PO 1 TIME ON FIRST DAY THEN 75 MG QD, Disp: , Rfl: 0 .  colchicine 0.6 MG tablet, TAKE 1 TABLET BY MOUTH EVERY DAY OR AS NEEDED, Disp: 30 tablet, Rfl: 0 .  Cyanocobalamin (VITAMIN B-12 IJ), Inject 1,000 mcg as directed every 30 (thirty) days. Normally 1st of the month, Disp: , Rfl:  .  cyclobenzaprine (FLEXERIL) 10 MG tablet, TK 1 T PO Q 8 H PRF MUSCLE SPASMS, Disp: , Rfl: 0 .  esomeprazole (NEXIUM) 40 MG capsule, TAKE ONE CAPSULE BY MOUTH EVERY DAY AT NOON, Disp: 14 capsule, Rfl: 0 .  exemestane (AROMASIN) 25 MG tablet, Take 1 tablet (25 mg total) by mouth daily after breakfast., Disp: 90 tablet, Rfl: 4 .  fluticasone (FLONASE) 50 MCG/ACT nasal spray, Place 1 spray into the nose daily as needed for rhinitis. , Disp: , Rfl:  .  furosemide (LASIX) 40 MG  tablet, Take 40 mg by mouth daily as needed for fluid. , Disp: , Rfl:  .  lisinopril (PRINIVIL,ZESTRIL) 10 MG tablet, TK 1 T PO QD, Disp: , Rfl: 11 .  LORazepam (ATIVAN) 0.5 MG tablet, Take 0.5 mg by mouth at bedtime as needed for anxiety (or sleep)., Disp: , Rfl:  .  menthol-cetylpyridinium (CEPACOL) 3 MG lozenge, Take 1 lozenge (3 mg total) by mouth as needed for sore throat., Disp: 100 tablet, Rfl: 12 .  metoprolol tartrate (LOPRESSOR) 25 MG tablet, TK 1 T PO BID, Disp: , Rfl: 11 .  Triamcinolone Acetonide (TRIAMCINOLONE 0.1 % CREAM : EUCERIN) CREA, Apply 1 application topically 3 (three) times daily., Disp: 1 each, Rfl: 1 .  venlafaxine XR (EFFEXOR-XR) 150 MG 24 hr capsule, TAKE 1 CAPSULE BY MOUTH DAILY WITH BREAKFAST, Disp: 90 capsule, Rfl: 0 .  venlafaxine XR (EFFEXOR-XR) 37.5 MG 24 hr capsule, Take 1 capsule (37.5 mg total) by mouth daily., Disp: 90 capsule, Rfl: 3  Past Medical History: Past Medical History:  Diagnosis Date  . Allergy   . Anxiety    takes Ativan daily prn  . Arthritis   . Breast cancer ([place]) 2014   ER+/PR+/HEr2-,   . Bruises easily   . Colon polyps    2 polyps by report  . Diverticulosis   .  GERD (gastroesophageal reflux disease)    occasionally takes Nexium   . Gout    takes Allopurinol daily and Colchicine daily prn;last attack 15yr ago  . Gout   . History of bladder infections    many yrs ago  . History of bronchitis    last time at least 816yrago  . History of stress incontinence   . Hyperlipidemia    takes Pravastatin daily  . Insomnia    takes Ambien nightly prn  . OSA (obstructive sleep apnea)    on CPAP  . Osteoarthritis   . Peripheral edema    takes Furosemide daily prn  . PONV (postoperative nausea and vomiting)   . Postmenopausal hormone therapy   . Radiation 07/27/13-09/07/13   Right Breast x 31 treatments  . Rhinitis    uses Flonase prn  . Sinus congestion   . Status post breast reconstruction 10/15/14   Bilateral implant removal  and [place]  . Tachycardia    takes Metoprolol daily  . Ventricular tachycardia, non-sustained ([place]   during sleep study 2009 with normal cardiac workup    Tobacco Use: Social History   Tobacco Use  Smoking Status Former Smoker  . Packs/day: 1.00  . Years: 20.00  . Pack years: 20.00  . Start date: 06/09/1992  Smokeless Tobacco Never Used  Tobacco Comment   quit at age 72  Labs: Recent Review Flowsheet Data    There is no flowsheet data to display.      Capillary Blood Glucose: No results found for: GLUCAP   Exercise Target Goals: Date: 01/06/18  Exercise Program Goal: Individual exercise prescription set using results from initial 6 min walk test and THRR while considering  patient's activity barriers and safety.   Exercise Prescription Goal: Initial exercise prescription builds to 30-45 minutes a day of aerobic activity, 2-3 days per week.  Home exercise guidelines will be given to patient during program as part of exercise prescription that the participant will acknowledge.  Activity Barriers & Risk Stratification: Activity Barriers & Cardiac Risk Stratification - 01/06/18 1511      Activity Barriers & Cardiac Risk Stratification   Activity Barriers  Back Problems;Balance Concerns;Other (comment)    Comments  Bilateral Knee Pain    Cardiac Risk Stratification  High       6 Minute Walk: 6 Minute Walk    Row Name 01/06/18 1507         6 Minute Walk   Phase  Initial     Distance  1238 feet     Walk Time  6 minutes     # of Rest Breaks  0     MPH  2.34     METS  2.4     RPE  11     Perceived Dyspnea   0     VO2 Peak  8.42     Symptoms  No     Resting HR  69 bpm     Resting BP  102/62     Resting Oxygen Saturation   100 %     Exercise Oxygen Saturation  during 6 min walk  100 %     Max Ex. HR  93 bpm     Max Ex. BP  120/60     2 Minute Post BP  102/70        Oxygen Initial Assessment:   Oxygen  Re-Evaluation:   Oxygen Discharge (Final Oxygen Re-Evaluation):  Initial Exercise Prescription: Initial Exercise Prescription - 01/06/18 1500      Date of Initial Exercise RX and Referring Provider   Date  01/06/18    Referring Provider  Sherren Mocha MD      Recumbant Bike   Level  1.2    Watts  10    Minutes  10    METs  2.4      NuStep   Level  2    SPM  80    Minutes  10    METs  2.2      Arm Ergometer   Level  1    Watts  15    Minutes  10    METs  2.3      Prescription Details   Frequency (times per week)  3x    Duration  Progress to 30 minutes of continuous aerobic without signs/symptoms of physical distress      Intensity   THRR 40-80% of Max Heartrate  59-118    Ratings of Perceived Exertion  11-13    Perceived Dyspnea  0-4      Progression   Progression  Continue progressive overload as per policy without signs/symptoms or physical distress.      Resistance Training   Training Prescription  Yes    Weight  3lbs    Reps  10-15       Perform Capillary Blood Glucose checks as needed.  Exercise Prescription Changes:   Exercise Comments:   Exercise Goals and Review: Exercise Goals    Row Name 01/06/18 1511             Exercise Goals   Increase Physical Activity  Yes       Intervention  Provide advice, education, support and counseling about physical activity/exercise needs.;Develop an individualized exercise prescription for aerobic and resistive training based on initial evaluation findings, risk stratification, comorbidities and participant's personal goals.       Expected Outcomes  Short Term: Attend rehab on a regular basis to increase amount of physical activity.;Long Term: Add in home exercise to make exercise part of routine and to increase amount of physical activity.;Long Term: Exercising regularly at least 3-5 days a week.       Increase Strength and Stamina  Yes       Intervention  Provide advice, education, support and  counseling about physical activity/exercise needs.;Develop an individualized exercise prescription for aerobic and resistive training based on initial evaluation findings, risk stratification, comorbidities and participant's personal goals.       Expected Outcomes  Short Term: Increase workloads from initial exercise prescription for resistance, speed, and METs.;Short Term: Perform resistance training exercises routinely during rehab and add in resistance training at home;Long Term: Improve cardiorespiratory fitness, muscular endurance and strength as measured by increased METs and functional capacity (6MWT)       Able to understand and use rate of perceived exertion (RPE) scale  Yes       Intervention  Provide education and explanation on how to use RPE scale       Expected Outcomes  Short Term: Able to use RPE daily in rehab to express subjective intensity level;Long Term:  Able to use RPE to guide intensity level when exercising independently       Knowledge and understanding of Target Heart Rate Range (THRR)  Yes       Intervention  Provide education and explanation of THRR including how the numbers were predicted and where they are  located for reference       Expected Outcomes  Short Term: Able to state/look up THRR;Short Term: Able to use daily as guideline for intensity in rehab;Long Term: Able to use THRR to govern intensity when exercising independently       Able to check pulse independently  Yes       Intervention  Provide education and demonstration on how to check pulse in carotid and radial arteries.;Review the importance of being able to check your own pulse for safety during independent exercise       Expected Outcomes  Long Term: Able to check pulse independently and accurately;Short Term: Able to explain why pulse checking is important during independent exercise       Understanding of Exercise Prescription  Yes       Intervention  Provide education, explanation, and written materials  on patient's individual exercise prescription       Expected Outcomes  Short Term: Able to explain program exercise prescription;Long Term: Able to explain home exercise prescription to exercise independently          Exercise Goals Re-Evaluation :    Discharge Exercise Prescription (Final Exercise Prescription Changes):   Nutrition:  Target Goals: Understanding of nutrition guidelines, daily intake of sodium <152m, cholesterol <2054m calories 30% from fat and 7% or less from saturated fats, daily to have 5 or more servings of fruits and vegetables.  Biometrics: Pre Biometrics - 01/06/18 1512      Pre Biometrics   Height  5' 1.5" (1.562 m)    Weight  144 lb 13.5 oz (65.7 kg)    Waist Circumference  38 inches    Hip Circumference  39.5 inches    Waist to Hip Ratio  0.96 %    BMI (Calculated)  26.93    Triceps Skinfold  27 mm    % Body Fat  40.3 %    Grip Strength  25 kg    Flexibility  25 in    Single Leg Stand  18.1 seconds        Nutrition Therapy Plan and Nutrition Goals:   Nutrition Assessments:   Nutrition Goals Re-Evaluation:   Nutrition Goals Re-Evaluation:   Nutrition Goals Discharge (Final Nutrition Goals Re-Evaluation):   Psychosocial: Target Goals: Acknowledge presence or absence of significant depression and/or stress, maximize coping skills, provide positive support system. Participant is able to verbalize types and ability to use techniques and skills needed for reducing stress and depression.  Initial Review & Psychosocial Screening: Initial Psych Review & Screening - 01/06/18 1610      Initial Review   Current issues with  Current Anxiety/Panic;Current Stress Concerns    Comments  JaJerrinexhibits stress related to her illness.  She is fearful and anxious after her CV event.       Family Dynamics   Good Support System?  Yes Pt reports that her husband and daughters are sources of support for her.      Barriers   Psychosocial barriers to  participate in program  The patient should benefit from training in stress management and relaxation.      Screening Interventions   Interventions  Encouraged to exercise    Expected Outcomes  Short Term goal: Utilizing psychosocial counselor, staff and physician to assist with identification of specific Stressors or current issues interfering with healing process. Setting desired goal for each stressor or current issue identified.;Long Term Goal: Stressors or current issues are controlled or eliminated.  Quality of Life Scores: Quality of Life - 01/06/18 1514      Quality of Life Scores   Health/Function Pre  20.57 %    Socioeconomic Pre  27 %    Psych/Spiritual Pre  25.5 %    Family Pre  26.38 %    GLOBAL Pre  23.78 %      Scores of 19 and below usually indicate a poorer quality of life in these areas.  A difference of  2-3 points is a clinically meaningful difference.  A difference of 2-3 points in the total score of the Quality of Life Index has been associated with significant improvement in overall quality of life, self-image, physical symptoms, and general health in studies assessing change in quality of life.  PHQ-9: Recent Review Flowsheet Data    Depression screen [facility] 2/9 01/06/2017 03/30/2016 02/10/2015   Decreased Interest 0 0 0   Down, Depressed, Hopeless 0 0 0   PHQ - 2 Score 0 0 0     Interpretation of Total Score  Total Score Depression Severity:  1-4 = Minimal depression, 5-9 = Mild depression, 10-14 = Moderate depression, 15-19 = Moderately severe depression, 20-27 = Severe depression   Psychosocial Evaluation and Intervention:   Psychosocial Re-Evaluation:   Psychosocial Discharge (Final Psychosocial Re-Evaluation):   Vocational Rehabilitation: Provide vocational rehab assistance to qualifying candidates.   Vocational Rehab Evaluation & Intervention: Vocational Rehab - 01/06/18 1610      Initial Vocational Rehab Evaluation & Intervention    Assessment shows need for Vocational Rehabilitation  No       Education: Education Goals: Education classes will be provided on a weekly basis, covering required topics. Participant will state understanding/return demonstration of topics presented.  Learning Barriers/Preferences: Learning Barriers/Preferences - 01/06/18 1514      Learning Barriers/Preferences   Learning Barriers  Sight    Learning Preferences  Skilled Demonstration;Pictoral;Verbal Instruction;Individual Instruction       Education Topics: Count Your Pulse:  -Group instruction provided by verbal instruction, demonstration, patient participation and written materials to support subject.  Instructors address importance of being able to find your pulse and how to count your pulse when at home without a heart monitor.  Patients get hands on experience counting their pulse with staff help and individually.   Heart Attack, Angina, and Risk Factor Modification:  -Group instruction provided by verbal instruction, video, and written materials to support subject.  Instructors address signs and symptoms of angina and heart attacks.    Also discuss risk factors for heart disease and how to make changes to improve heart health risk factors.   Functional Fitness:  -Group instruction provided by verbal instruction, demonstration, patient participation, and written materials to support subject.  Instructors address safety measures for doing things around the house.  Discuss how to get up and down off the floor, how to pick things up properly, how to safely get out of a chair without assistance, and balance training.   Meditation and Mindfulness:  -Group instruction provided by verbal instruction, patient participation, and written materials to support subject.  Instructor addresses importance of mindfulness and meditation practice to help reduce stress and improve awareness.  Instructor also leads participants through a meditation  exercise.    Stretching for Flexibility and Mobility:  -Group instruction provided by verbal instruction, patient participation, and written materials to support subject.  Instructors lead participants through series of stretches that are designed to increase flexibility thus improving mobility.  These stretches are additional  exercise for major muscle groups that are typically performed during regular warm up and cool down.   Hands Only CPR:  -Group verbal, video, and participation provides a basic overview of AHA guidelines for community CPR. Role-play of emergencies allow participants the opportunity to practice calling for help and chest compression technique with discussion of AED use.   Hypertension: -Group verbal and written instruction that provides a basic overview of hypertension including the most recent diagnostic guidelines, risk factor reduction with self-care instructions and medication management.    Nutrition I class: Heart Healthy Eating:  -Group instruction provided by PowerPoint slides, verbal discussion, and written materials to support subject matter. The instructor gives an explanation and review of the Therapeutic Lifestyle Changes diet recommendations, which includes a discussion on lipid goals, dietary fat, sodium, fiber, plant stanol/sterol esters, sugar, and the components of a well-balanced, healthy diet.   Nutrition II class: Lifestyle Skills:  -Group instruction provided by PowerPoint slides, verbal discussion, and written materials to support subject matter. The instructor gives an explanation and review of label reading, grocery shopping for heart health, heart healthy recipe modifications, and ways to make healthier choices when eating out.   Diabetes Question & Answer:  -Group instruction provided by PowerPoint slides, verbal discussion, and written materials to support subject matter. The instructor gives an explanation and review of diabetes  co-morbidities, pre- and post-prandial blood glucose goals, pre-exercise blood glucose goals, signs, symptoms, and treatment of hypoglycemia and hyperglycemia, and foot care basics.   Diabetes Blitz:  -Group instruction provided by PowerPoint slides, verbal discussion, and written materials to support subject matter. The instructor gives an explanation and review of the physiology behind type 1 and type 2 diabetes, diabetes medications and rational behind using different medications, pre- and post-prandial blood glucose recommendations and Hemoglobin A1c goals, diabetes diet, and exercise including blood glucose guidelines for exercising safely.    Portion Distortion:  -Group instruction provided by PowerPoint slides, verbal discussion, written materials, and food models to support subject matter. The instructor gives an explanation of serving size versus portion size, changes in portions sizes over the last 20 years, and what consists of a serving from each food group.   Stress Management:  -Group instruction provided by verbal instruction, video, and written materials to support subject matter.  Instructors review role of stress in heart disease and how to cope with stress positively.     Exercising on Your Own:  -Group instruction provided by verbal instruction, power point, and written materials to support subject.  Instructors discuss benefits of exercise, components of exercise, frequency and intensity of exercise, and end points for exercise.  Also discuss use of nitroglycerin and activating EMS.  Review options of places to exercise outside of rehab.  Review guidelines for sex with heart disease.   Cardiac Drugs I:  -Group instruction provided by verbal instruction and written materials to support subject.  Instructor reviews cardiac drug classes: antiplatelets, anticoagulants, beta blockers, and statins.  Instructor discusses reasons, side effects, and lifestyle considerations for each  drug class.   Cardiac Drugs II:  -Group instruction provided by verbal instruction and written materials to support subject.  Instructor reviews cardiac drug classes: angiotensin converting enzyme inhibitors (ACE-I), angiotensin II receptor blockers (ARBs), nitrates, and calcium channel blockers.  Instructor discusses reasons, side effects, and lifestyle considerations for each drug class.   Anatomy and Physiology of the Circulatory System:  Group verbal and written instruction and models provide basic cardiac anatomy and physiology, with the  coronary electrical and arterial systems. Review of: AMI, Angina, Valve disease, Heart Failure, Peripheral Artery Disease, Cardiac Arrhythmia, Pacemakers, and the ICD.   Other Education:  -Group or individual verbal, written, or video instructions that support the educational goals of the cardiac rehab program.   Holiday Eating Survival Tips:  -Group instruction provided by PowerPoint slides, verbal discussion, and written materials to support subject matter. The instructor gives patients tips, tricks, and techniques to help them not only survive but enjoy the holidays despite the onslaught of food that accompanies the holidays.   Knowledge Questionnaire Score: Knowledge Questionnaire Score - 01/06/18 1514      Knowledge Questionnaire Score   Pre Score  22/24       Core Components/Risk Factors/Patient Goals at Admission: Personal Goals and Risk Factors at Admission - 01/06/18 1506      Core Components/Risk Factors/Patient Goals on Admission    Weight Management  Yes;Weight Loss    Intervention  Weight Management: Develop a combined nutrition and exercise program designed to reach desired caloric intake, while maintaining appropriate intake of nutrient and fiber, sodium and fats, and appropriate energy expenditure required for the weight goal.;Weight Management: Provide education and appropriate resources to help participant work on and attain  dietary goals.    Admit Weight  144 lb 13.5 oz (65.7 kg)    Goal Weight: Short Term  139 lb (63 kg)    Goal Weight: Long Term  134 lb (60.8 kg)    Expected Outcomes  Long Term: Adherence to nutrition and physical activity/exercise program aimed toward attainment of established weight goal;Short Term: Continue to assess and modify interventions until short term weight is achieved;Weight Loss: Understanding of general recommendations for a balanced deficit meal plan, which promotes 1-2 lb weight loss per week and includes a negative energy balance of [phone] kcal/d;Understanding recommendations for meals to include 15-35% energy as protein, 25-35% energy from fat, 35-60% energy from carbohydrates, less than 239m of dietary cholesterol, 20-35 gm of total fiber daily;Understanding of distribution of calorie intake throughout the day with the consumption of 4-5 meals/snacks    Hypertension  Yes    Intervention  Provide education on lifestyle modifcations including regular physical activity/exercise, weight management, moderate sodium restriction and increased consumption of fresh fruit, vegetables, and low fat dairy, alcohol moderation, and smoking cessation.;Monitor prescription use compliance.    Expected Outcomes  Short Term: Continued assessment and intervention until BP is < 140/967mHG in hypertensive participants. < 130/8066mG in hypertensive participants with diabetes, heart failure or chronic kidney disease.;Long Term: Maintenance of blood pressure at goal levels.    Lipids  Yes    Intervention  Provide education and support for participant on nutrition & aerobic/resistive exercise along with prescribed medications to achieve LDL <28m25mDL >40mg5m Expected Outcomes  Short Term: Participant states understanding of desired cholesterol values and is compliant with medications prescribed. Participant is following exercise prescription and nutrition guidelines.;Long Term: Cholesterol controlled with  medications as prescribed, with individualized exercise RX and with personalized nutrition plan. Value goals: LDL < 28mg,80m > 40 mg.    Stress  Yes    Intervention  Offer individual and/or small group education and counseling on adjustment to heart disease, stress management and health-related lifestyle change. Teach and support self-help strategies.;Refer participants experiencing significant psychosocial distress to appropriate mental health specialists for further evaluation and treatment. When possible, include family members and significant others in education/counseling sessions.    Expected Outcomes  Short  Term: Participant demonstrates changes in health-related behavior, relaxation and other stress management skills, ability to obtain effective social support, and compliance with psychotropic medications if prescribed.;Long Term: Emotional wellbeing is indicated by absence of clinically significant psychosocial distress or social isolation.       Core Components/Risk Factors/Patient Goals Review:    Core Components/Risk Factors/Patient Goals at Discharge (Final Review):    ITP Comments: ITP Comments    Row Name 01/06/18 1454           ITP Comments  Dr. Fransico Him, Medical Director          Comments: Patient attended orientation from 1339 to 1457 to review rules and guidelines for program. Completed 6 minute walk test, Intitial ITP, and exercise prescription.  VSS. Telemetry-SR.  Asymptomatic.

## 2018-01-06 NOTE — Progress Notes (Signed)
Tracey [name] 72 y.o. female DOB: 1945/09/21 MRN: [id number]      Nutrition Note  1. NSTEMI (non-ST elevated myocardial infarction) ([place])   2. Status post coronary artery stent placement    Past Medical History:  Diagnosis Date  . Allergy   . Anxiety    takes Ativan daily prn  . Arthritis   . Breast cancer ([place]) 2014   ER+/PR+/HEr2-,   . Bruises easily   . Colon polyps    2 polyps by report  . Diverticulosis   . GERD (gastroesophageal reflux disease)    occasionally takes Nexium   . Gout    takes Allopurinol daily and Colchicine daily prn;last attack 51yr ago  . Gout   . History of bladder infections    many yrs ago  . History of bronchitis    last time at least 81yrago  . History of stress incontinence   . Hyperlipidemia    takes Pravastatin daily  . Insomnia    takes Ambien nightly prn  . OSA (obstructive sleep apnea)    on CPAP  . Osteoarthritis   . Peripheral edema    takes Furosemide daily prn  . PONV (postoperative nausea and vomiting)   . Postmenopausal hormone therapy   . Radiation 07/27/13-09/07/13   Right Breast x 31 treatments  . Rhinitis    uses Flonase prn  . Sinus congestion   . Status post breast reconstruction 10/15/14   [place]  . Tachycardia    takes Metoprolol daily  . Ventricular tachycardia, non-sustained ([facility]   during sleep study 2009 with normal cardiac workup   Meds reviewed.  HT: Ht Readings from Last 1 Encounters:  01/06/18 5' 1.5" (1.562 m)    WT: Wt Readings from Last 5 Encounters:  01/06/18 144 lb 13.5 oz (65.7 kg)  12/13/17 141 lb 14.4 oz (64.4 kg)  07/02/17 154 lb 4.8 oz (70 kg)  05/24/17 152 lb (68.9 kg)  02/08/17 149 lb 14.4 oz (68 kg)     Body mass index is 26.92 kg/m.   Current tobacco use? No   Labs:  Lipid Panel  No results found for: CHOL, TRIG, HDL, CHOLHDL, VLDL, LDLCALC, LDLDIRECT  No results found for: HGBA1C CBG (last 3)  No results for input(s):  GLUCAP in the last 72 hours.  Nutrition Note Spoke with pt. Nutrition plan and goals reviewed with pt. Pt is following Step 2 of the Therapeutic Lifestyle Changes diet. Pt wants to lose wt. Pt reports losing 8 lb after her MI and wants to lose 8 lb more. Wt loss tips reviewed.  Pt expressed understanding of the information reviewed. Pt aware of nutrition education classes offered.  Nutrition Diagnosis ? Food-and nutrition-related knowledge deficit related to lack of exposure to information as related to diagnosis of: ? CVD ? Overweight related to excessive energy intake as evidenced by a Body mass index is 26.92 kg/m.  Nutrition Intervention ? Pt's individual nutrition plan and goals reviewed with pt. ? Pt given handouts for: ? Nutrition I class ? Nutrition II class   Nutrition Goal(s):  ? Pt to identify food quantities necessary to achieve weight loss of 8 lb at graduation from cardiac rehab.  Plan:  Pt to attend nutrition classes ? Nutrition I ? Nutrition II ? Portion Distortion  Will provide client-centered nutrition education as part of interdisciplinary care.   Monitor and evaluate progress toward nutrition goal with team.  EdDerek Mound  M.Ed, RD, LDN, CDE 01/07/2018 7:54 AM

## 2018-01-12 ENCOUNTER — Encounter (HOSPITAL_COMMUNITY): Payer: Medicare Other

## 2018-01-12 ENCOUNTER — Other Ambulatory Visit: Payer: Self-pay | Admitting: Oncology

## 2018-01-12 ENCOUNTER — Encounter (HOSPITAL_COMMUNITY): Payer: Self-pay

## 2018-01-12 ENCOUNTER — Encounter (HOSPITAL_COMMUNITY)
Admission: RE | Admit: 2018-01-12 | Discharge: 2018-01-12 | Disposition: A | Discharge status: Home / Self Care | Payer: Medicare Other | Source: Ambulatory Visit | Attending: Cardiovascular Disease | Admitting: Cardiovascular Disease

## 2018-01-12 DIAGNOSIS — I214 Non-ST elevation (NSTEMI) myocardial infarction: Secondary | ICD-10-CM | POA: Diagnosis not present

## 2018-01-12 DIAGNOSIS — Z955 Presence of coronary angioplasty implant and graft: Secondary | ICD-10-CM

## 2018-01-12 DIAGNOSIS — G4733 Obstructive sleep apnea (adult) (pediatric): Secondary | ICD-10-CM | POA: Diagnosis not present

## 2018-01-12 DIAGNOSIS — Z7982 Long term (current) use of aspirin: Secondary | ICD-10-CM | POA: Diagnosis not present

## 2018-01-12 DIAGNOSIS — Z79899 Other long term (current) drug therapy: Secondary | ICD-10-CM | POA: Diagnosis not present

## 2018-01-12 DIAGNOSIS — Z853 Personal history of malignant neoplasm of breast: Secondary | ICD-10-CM | POA: Diagnosis not present

## 2018-01-12 NOTE — Progress Notes (Signed)
Daily Session Note  Patient Details  Name: Tracey [name] MRN: [id number] Date of Birth: Jan 11, 1946 Referring Provider:     CARDIAC REHAB PHASE II ORIENTATION from 01/06/2018 in [place]  Referring Provider  Sherren Mocha MD      Encounter Date: 01/12/2018  Check In: Session Check In - 01/12/18 1150      Check-In   Location  [facility]    Staff Present  Jiles Garter, RN BSN;Tyara Carol Ada, MS,ACSM CEP, Exercise Physiologist;Maria Venetia Maxon, RN, BSN    Supervising physician immediately available to respond to emergencies  [facility] immediately available    Physician(s)  Dr Maylene Roes    Medication changes reported      No    Fall or balance concerns reported     No    Tobacco Cessation  No Change    Warm-up and Cool-down  Performed as group-led instruction    Resistance Training Performed  No    VAD Patient?  No      Pain Assessment   Currently in Pain?  No/denies       Capillary Blood Glucose: No results found for this or any previous visit (from the past 24 hour(s)).  Exercise Prescription Changes - 01/12/18 1400      Response to Exercise   Blood Pressure (Admit)  116/64    Blood Pressure (Exercise)  128/68    Blood Pressure (Exit)  112/60    Heart Rate (Admit)  73 bpm    Heart Rate (Exercise)  77 bpm    Heart Rate (Exit)  57 bpm    Rating of Perceived Exertion (Exercise)  12    Perceived Dyspnea (Exercise)  0    Symptoms  None    Comments  Pt oriented to exericse equipment     Duration  Progress to 30 minutes of  aerobic without signs/symptoms of physical distress    Intensity  THRR unchanged      Progression   Progression  Continue to progress workloads to maintain intensity without signs/symptoms of physical distress.    Average METs  2.4      Resistance Training   Training Prescription  No      Interval Training   Interval Training  No      NuStep   Level  4    SPM  85    Minutes  15    METs  2.4       Arm Ergometer   Level  2    Watts  10    Minutes  15    METs  2.3       Social History   Tobacco Use  Smoking Status Former Smoker  . Packs/day: 1.00  . Years: 20.00  . Pack years: 20.00  . Start date: 06/09/1992  Smokeless Tobacco Never Used  Tobacco Comment   quit at age 21    Goals Met:  Exercise tolerated well  Goals Unmet:  Not Applicable  Comments: Pt started cardiac rehab today.  Pt tolerated light exercise without difficulty. VSS, telemetry-SR, asymptomatic.  Medication list reconciled. Pt denies barriers to medicaiton compliance.  PSYCHOSOCIAL ASSESSMENT:  PHQ-0. Pt reports having "crying episodes at times" when discussing CV event.  Pt declined meeting with spiritual care department at this time.  Currently supported on a medical regimen with medicine to take as needed for "crying spells."  She endorses that it helps.  Pt oriented to exercise equipment and routine.  Understanding verbalized.   Dr. Fransico Him is Medical Director for Cardiac Rehab at [facility].

## 2018-01-14 ENCOUNTER — Ambulatory Visit (INDEPENDENT_AMBULATORY_CARE_PROVIDER_SITE_OTHER): Payer: Medicare Other | Admitting: Cardiovascular Disease

## 2018-01-14 ENCOUNTER — Encounter: Payer: Self-pay | Admitting: Cardiovascular Disease

## 2018-01-14 ENCOUNTER — Encounter (HOSPITAL_COMMUNITY): Payer: Medicare Other

## 2018-01-14 ENCOUNTER — Encounter (INDEPENDENT_AMBULATORY_CARE_PROVIDER_SITE_OTHER): Payer: Self-pay

## 2018-01-14 VITALS — BP 126/70 | HR 71 | Ht 61.5 in | Wt 144.8 lb

## 2018-01-14 DIAGNOSIS — I2510 Atherosclerotic heart disease of native coronary artery without angina pectoris: Secondary | ICD-10-CM | POA: Diagnosis not present

## 2018-01-14 MED ORDER — METOPROLOL TARTRATE 25 MG PO TABS: 12.5000 mg | ORAL_TABLET | Freq: Two times a day (BID) | ORAL | 3 refills | Status: DC

## 2018-01-14 MED ORDER — PANTOPRAZOLE SODIUM 40 MG PO TBEC: 40.0000 mg | DELAYED_RELEASE_TABLET | Freq: Every day | ORAL | 3 refills | Status: DC

## 2018-01-14 NOTE — Progress Notes (Signed)
Cardiology Office Note Date:  01/14/2018   ID:  Tracey, [name] [birth year], MRN [id number]  PCP:  [name], MD  Cardiologist:  Sherren Mocha, MD    Chief Complaint  Patient presents with  . Coronary Artery Disease     History of Present Illness: Tracey [name] is a 72 y.o. female who presents for evaluation of CAD.  She presented in January 2019 with NSTEMI in [place], [place]. She underwent diagnostic cath demonstrating critical LAD/diagonal stenosis. She was transferred to [facility] and underwent PCI using drug eluting stents in the LAD. During the procedure she has actue stent thrombosis and required a second stent. She has done well since then and spends much of the year in [place], here to establish care today.  She describes a feeling of 'indigestion' in the chest, shortness of breath, and pain radiating to the left arm. She had similar symptoms a year earlier and was checked out in the ER and all testing was normal.   The patient is here with her husband and daughter today. She has had hyperlipidemia in the past, on and off of pravastatin. Now on atorvastatin.  She complains of generalized fatigue and associates this with her metoprolol.  When she delays her dose she has much more energy.  She denies any exertional chest pain or pressure.  She does not have symptoms like those occurring at the time of her MI.  However, she continues to complain of indigestion in the chest area, but no exertional component.  No shortness of breath, edema, or recent heart palpitations.   Past Medical History:  Diagnosis Date  . Allergy   . Anxiety    takes Ativan daily prn  . Arthritis   . Breast cancer ([place]) 2014   ER+/PR+/HEr2-,   . Bruises easily   . Colon polyps    2 polyps by report  . Diverticulosis   . GERD (gastroesophageal reflux disease)    occasionally takes Nexium   . Gout    takes Allopurinol daily and Colchicine daily prn;last attack 42yr ago  . Gout   .  History of bladder infections    many yrs ago  . History of bronchitis    last time at least 89yrago  . History of stress incontinence   . Hyperlipidemia    takes Pravastatin daily  . Insomnia    takes Ambien nightly prn  . OSA (obstructive sleep apnea)    on CPAP  . Osteoarthritis   . Peripheral edema    takes Furosemide daily prn  . PONV (postoperative nausea and vomiting)   . Postmenopausal hormone therapy   . Radiation 07/27/13-09/07/13   Right Breast x 31 treatments  . Rhinitis    uses Flonase prn  . Sinus congestion   . Status post breast reconstruction 10/15/14   [place]  . Tachycardia    takes Metoprolol daily  . Vent[place] tachycardia, non-sustained ([facility]   during sleep study 2009 with normal cardiac workup    Past Surgical History:  Procedure Laterality Date  . ABDOMINAL HYSTERECTOMY    . APPENDECTOMY    . AXILLARY SENTINEL NODE BIOPSY Right 05/23/2013   Procedure: AXILLARY SENTINEL NODE BIOPSY;  Surgeon: ToOdis HollingsheadMD;  [facility] Service: [facility];  Laterality: Right;  nuc med injection 7:00  . bilateral cataract surgery    . BREAST RECONSTRUCTION Bilateral 05/17/2014   W  . BREAST RECONSTRUCTION  WITH PLACEMENT OF TISSUE EXPANDER AND FLEX HD (ACELLULAR HYDRATED DERMIS) Bilateral 05/23/2013   Procedure: BILATERAL BREAST RECONSTRUCTION WITH PLACEMENT OF TISSUE EXPANDER AND FLEX HD;  Surgeon: [name], MD;  Location: [place];  Service: Plastics;  Laterality: Bilateral;  . CARDIAC CATHETERIZATION  1999  . CHOLECYSTECTOMY    . COLONOSCOPY    . COSMETIC SURGERY    . GALLBLADDER SURGERY    . REMOVAL OF BILATERAL TISSUE EXPANDERS WITH PLACEMENT OF BILATERAL BREAST IMPLANTS Bilateral 05/17/2014   Procedure: REMOVAL OF BILATERAL TISSUE EXPANDERS WITH PLACEMENT OF BILATERAL BREAST IMPLANTS FOR RECONSTRUCTION;  Surgeon: [name], MD;  Location: [place];  Service: Plastics;  Laterality: Bilateral;  .  TONSILLECTOMY    . TOTAL MASTECTOMY Bilateral 05/23/2013   WITH RECONSTRUCTION  . TOTAL MASTECTOMY Bilateral 05/23/2013   Procedure: TOTAL MASTECTOMY;  Surgeon: [name], MD;  Location: [place];  Service: General;  Laterality: Bilateral;  . urinary tightening     d/t urinary incontinence    Current Outpatient Medications  Medication Sig Dispense Refill  . allopurinol (ZYLOPRIM) 100 MG tablet TAKE 1 TABLET BY MOUTH ONCE DAILY 90 tablet 0  . ASPIRIN LOW DOSE 81 MG chewable tablet CSW 1 T PO D  0  . atorvastatin (LIPITOR) 80 MG tablet TK 1 T PO QD  11  . celecoxib (CELEBREX) 200 MG capsule TK ONE C PO QD  3  . cholecalciferol 5000 UNITS TABS Take 0.2 tablets (1,000 Units total) by mouth daily. 100 tablet 3  . clopidogrel (PLAVIX) 75 MG tablet TK 1 T PO QD  TK 300 MG 4 T PO 1 TIME ON FIRST DAY THEN 75 MG QD  0  . colchicine 0.6 MG tablet TAKE 1 TABLET BY MOUTH EVERY DAY OR AS NEEDED 30 tablet 0  . Cyanocobalamin (VITAMIN B-12 IJ) Inject 1,000 mcg as directed every 30 (thirty) days. Normally 1st of the month    . cyclobenzaprine (FLEXERIL) 10 MG tablet TK 1 T PO Q 8 H PRF MUSCLE SPASMS  0  . exemestane (AROMASIN) 25 MG tablet Take 1 tablet (25 mg total) by mouth daily after breakfast. 90 tablet 4  . fluticasone (FLONASE) 50 MCG/ACT nasal spray Place 1 spray into the nose daily as needed for rhinitis.     . furosemide (LASIX) 40 MG tablet Take 40 mg by mouth daily as needed for fluid.     Marland Kitchen lisinopril (PRINIVIL,ZESTRIL) 10 MG tablet TK 1 T PO QD  11  . LORazepam (ATIVAN) 0.5 MG tablet Take 0.5 mg by mouth at bedtime as needed for anxiety (or sleep).    . menthol-cetylpyridinium (CEPACOL) 3 MG lozenge Take 1 lozenge (3 mg total) by mouth as needed for sore throat. 100 tablet 12  . metoprolol tartrate (LOPRESSOR) 25 MG tablet Take 0.5 tablets (12.5 mg total) by mouth 2 (two) times daily. 90 tablet 3  . Triamcinolone Acetonide (TRIAMCINOLONE 0.1 % CREAM : EUCERIN) CREA Apply 1 application  topically 3 (three) times daily. 1 each 1  . venlafaxine XR (EFFEXOR-XR) 150 MG 24 hr capsule TAKE 1 CAPSULE BY MOUTH DAILY WITH BREAKFAST 90 capsule 0  . venlafaxine XR (EFFEXOR-XR) 37.5 MG 24 hr capsule Take 1 capsule (37.5 mg total) by mouth daily. 90 capsule 3  . pantoprazole (PROTONIX) 40 MG tablet Take 1 tablet (40 mg total) by mouth daily. 90 tablet 3   No current facility-administered medications for this visit.     Allergies:   Ivp dye [iodinated diagnostic  agents] and Sulfa antibiotics   Social History:  The patient  reports that she has quit smoking. She started smoking about 25 years ago. She has a 20.00 pack-year smoking history. She has never used smokeless tobacco. She reports that she drinks about 3.0 oz of alcohol per week. She reports that she does not use drugs.   Family History:  The patient's family history includes ALS in her sister; Breast cancer in her maternal aunt, mother, and other; Breast cancer (age of onset: 13) in her maternal grandmother; Heart attack in her father, maternal grandfather, and paternal grandfather; Heart disease in her father.    ROS:  Please see the history of present illness.  Otherwise, review of systems is positive for cough, shortness of breath, dizziness, excessive sweating, snoring, balance problems.  All other systems are reviewed and negative.    PHYSICAL EXAM: VS:  BP 126/70   Pulse 71   Ht 5' 1.5" (1.562 m)   Wt 144 lb 12.8 oz (65.7 kg)   SpO2 98%   BMI 26.92 kg/m  , BMI Body mass index is 26.92 kg/m. GEN: Well nourished, well developed, in no acute distress  HEENT: normal  Neck: no JVD, no masses. No carotid bruits Cardiac: RRR without murmur or gallop                Respiratory:  clear to auscultation bilaterally, normal work of breathing GI: soft, nontender, nondistended, + BS MS: no deformity or atrophy  Ext: no pretibial edema, pedal pulses 2+= bilaterally Skin: warm and dry, no rash Neuro:  Strength and sensation are  intact Psych: euthymic mood, full affect  EKG:  EKG is ordered today. The ekg ordered today shows NSR 71 bpm, within normal limits  Recent Labs: 12/06/2017: ALT 45; BUN 27; Creatinine 1.29; Hemoglobin 12.7; Platelet Count 155; Potassium 4.0; Sodium 140   Lipid Panel  No results found for: CHOL, TRIG, HDL, CHOLHDL, VLDL, LDLCALC, LDLDIRECT    Wt Readings from Last 3 Encounters:  01/14/18 144 lb 12.8 oz (65.7 kg)  01/06/18 144 lb 13.5 oz (65.7 kg)  12/13/17 141 lb 14.4 oz (64.4 kg)     Cardiac Studies Reviewed: The following studies are available for review: Cardiac catheterization dated 1/ 31/2019: Normal left main, normal left circumflex, normal RCA, severe LAD stenosis involving the first diagonal estimated at 95%  Nuclear stress test dated November 01, 2017 shows normal findings with normal perfusion and an LVEF of 81%  2D echocardiogram 09/16/2017 shows normal left ventricular size and systolic function, anteroseptal and apical hypokinesis, LVEF 16%, normal diastolic function, small pericardial effusion  CT dated 09/15/2017: No pulmonary embolus, mild diffuse edematous wall thickening of the esophagus, hepatic steatosis  ASSESSMENT AND PLAN: 1.  CAD, native vessel, without angina: The patient appears to be doing well on her current regimen.  She does have some fatigue that likely associated with metoprolol.  I asked her to cut her dose in half and take 12.5 mg twice daily.  Otherwise she will continue on her current medicines without change.  I am glad she is enrolled locally with phase 2 cardiac rehab and she will continue with this.  I reviewed her cardiac catheterization report and she brings in images of her interventional procedure that are also reviewed.  She appears to have isolated disease in the LAD that has been treated with stenting with a good result.  She should remain on dual antiplatelet therapy with aspirin and clopidogrel for at least 12  months.  She should continue with  an aggressive secondary risk reduction program that includes a high intensity statin drug.  Her LV function is preserved with a normal LVEF.  2.  Hyperlipidemia: Diet and lifestyle modification reviewed.  She is treated with a high intensity statin drug.  Will update lipids and LFTs.  3.  Hypertension: Well-controlled on beta-blocker and ACE inhibitor.  4.  Gastroesophageal reflux disease: I discussed the interaction of Nexium and Plavix.  We will change her to Protonix.  Current medicines are reviewed with the patient today.  The patient does not have concerns regarding medicines.  Labs/ tests ordered today include:   Orders Placed This Encounter  Procedures  . Comprehensive metabolic panel  . Lipid panel  . EKG 12-Lead    Disposition:   FU 3 months APP, 6 months with me  Signed, Sherren Mocha, MD  01/14/2018 1:21 PM    [facility] [place], [place], [place]  [address] Phone: [phone]; Fax: [phone]

## 2018-01-14 NOTE — Patient Instructions (Signed)
Medication Instructions:  1) STOP NEXIUM 2) START PROTONIX 40 mg daily  3) DECREASE METOPROLOL to 12.5 mg twice daily  Labwork: None  Testing/Procedures: None  Follow-Up: Your provider recommends that you schedule a follow-up appointment in 3 months with Dr. Antionette Char assistant, Richardson Dopp.\  Your provider wants you to follow-up in: 6 months with Dr. Burt Knack. You will receive a reminder letter in the mail two months in advance. If you don't receive a letter, please call our office to schedule the follow-up appointment.    Any Other Special Instructions Will Be Listed Below (If Applicable).     If you need a refill on your cardiac medications before your next appointment, please call your pharmacy.

## 2018-01-17 ENCOUNTER — Other Ambulatory Visit: Payer: Medicare Other

## 2018-01-17 ENCOUNTER — Encounter (HOSPITAL_COMMUNITY)
Admission: RE | Admit: 2018-01-17 | Discharge: 2018-01-17 | Disposition: A | Discharge status: Home / Self Care | Payer: Medicare Other | Source: Ambulatory Visit | Attending: Cardiovascular Disease | Admitting: Cardiovascular Disease

## 2018-01-17 ENCOUNTER — Encounter (HOSPITAL_COMMUNITY): Payer: Medicare Other

## 2018-01-17 DIAGNOSIS — Z955 Presence of coronary angioplasty implant and graft: Secondary | ICD-10-CM | POA: Insufficient documentation

## 2018-01-17 DIAGNOSIS — G4733 Obstructive sleep apnea (adult) (pediatric): Secondary | ICD-10-CM | POA: Diagnosis not present

## 2018-01-17 DIAGNOSIS — I214 Non-ST elevation (NSTEMI) myocardial infarction: Secondary | ICD-10-CM | POA: Insufficient documentation

## 2018-01-17 DIAGNOSIS — E785 Hyperlipidemia, unspecified: Secondary | ICD-10-CM | POA: Insufficient documentation

## 2018-01-17 DIAGNOSIS — Z7982 Long term (current) use of aspirin: Secondary | ICD-10-CM | POA: Diagnosis not present

## 2018-01-17 DIAGNOSIS — Z853 Personal history of malignant neoplasm of breast: Secondary | ICD-10-CM | POA: Insufficient documentation

## 2018-01-17 DIAGNOSIS — Z79899 Other long term (current) drug therapy: Secondary | ICD-10-CM | POA: Diagnosis not present

## 2018-01-17 DIAGNOSIS — Z87891 Personal history of nicotine dependence: Secondary | ICD-10-CM | POA: Diagnosis not present

## 2018-01-19 ENCOUNTER — Encounter (HOSPITAL_COMMUNITY): Payer: Medicare Other

## 2018-01-19 ENCOUNTER — Encounter (HOSPITAL_COMMUNITY)
Admission: RE | Admit: 2018-01-19 | Discharge: 2018-01-19 | Disposition: A | Discharge status: Home / Self Care | Payer: Medicare Other | Source: Ambulatory Visit | Attending: Cardiovascular Disease | Admitting: Cardiovascular Disease

## 2018-01-19 DIAGNOSIS — Z7982 Long term (current) use of aspirin: Secondary | ICD-10-CM | POA: Diagnosis not present

## 2018-01-19 DIAGNOSIS — G4733 Obstructive sleep apnea (adult) (pediatric): Secondary | ICD-10-CM | POA: Diagnosis not present

## 2018-01-19 DIAGNOSIS — Z955 Presence of coronary angioplasty implant and graft: Secondary | ICD-10-CM | POA: Diagnosis not present

## 2018-01-19 DIAGNOSIS — Z79899 Other long term (current) drug therapy: Secondary | ICD-10-CM | POA: Diagnosis not present

## 2018-01-19 DIAGNOSIS — I214 Non-ST elevation (NSTEMI) myocardial infarction: Secondary | ICD-10-CM | POA: Diagnosis not present

## 2018-01-19 DIAGNOSIS — Z853 Personal history of malignant neoplasm of breast: Secondary | ICD-10-CM | POA: Diagnosis not present

## 2018-01-20 ENCOUNTER — Other Ambulatory Visit: Payer: Medicare Other

## 2018-01-20 ENCOUNTER — Encounter (INDEPENDENT_AMBULATORY_CARE_PROVIDER_SITE_OTHER): Payer: Self-pay

## 2018-01-20 DIAGNOSIS — I2510 Atherosclerotic heart disease of native coronary artery without angina pectoris: Secondary | ICD-10-CM

## 2018-01-20 LAB — COMPREHENSIVE METABOLIC PANEL
ALBUMIN: 4.3 g/dL (ref 3.5–4.8)
ALK PHOS: 59 IU/L (ref 39–117)
ALT: 86 IU/L — ABNORMAL HIGH (ref 0–32)
AST: 63 IU/L — ABNORMAL HIGH (ref 0–40)
Albumin/Globulin Ratio: 1.9 (ref 1.2–2.2)
BILIRUBIN TOTAL: 0.3 mg/dL (ref 0.0–1.2)
BUN / CREAT RATIO: 17 (ref 12–28)
BUN: 18 mg/dL (ref 8–27)
CHLORIDE: 106 mmol/L (ref 96–106)
CO2: 19 mmol/L — ABNORMAL LOW (ref 20–29)
Calcium: 9.5 mg/dL (ref 8.7–10.3)
Creatinine, Ser: 1.07 mg/dL — ABNORMAL HIGH (ref 0.57–1.00)
GFR calc Af Amer: 60 mL/min/{1.73_m2} (ref >59)
GFR calc non Af Amer: 52 mL/min/{1.73_m2} — ABNORMAL LOW (ref >59)
Globulin, Total: 2.3 g/dL (ref 1.5–4.5)
Glucose: 84 mg/dL (ref 65–99)
Potassium: 4.5 mmol/L (ref 3.5–5.2)
SODIUM: 143 mmol/L (ref 134–144)
Total Protein: 6.6 g/dL (ref 6.0–8.5)

## 2018-01-20 LAB — LIPID PANEL
Chol/HDL Ratio: 2.3 ratio (ref 0.0–4.4)
Cholesterol, Total: 137 mg/dL (ref 100–199)
HDL: 59 mg/dL (ref >39)
LDL CALC: 64 mg/dL (ref 0–99)
Triglycerides: 70 mg/dL (ref 0–149)
VLDL CHOLESTEROL CAL: 14 mg/dL (ref 5–40)

## 2018-01-21 ENCOUNTER — Encounter (HOSPITAL_COMMUNITY): Payer: Medicare Other

## 2018-01-21 ENCOUNTER — Encounter (HOSPITAL_COMMUNITY)
Admission: RE | Admit: 2018-01-21 | Discharge: 2018-01-21 | Disposition: A | Discharge status: Home / Self Care | Payer: Medicare Other | Source: Ambulatory Visit | Attending: Cardiovascular Disease | Admitting: Cardiovascular Disease

## 2018-01-21 DIAGNOSIS — I214 Non-ST elevation (NSTEMI) myocardial infarction: Secondary | ICD-10-CM | POA: Diagnosis not present

## 2018-01-21 DIAGNOSIS — Z7982 Long term (current) use of aspirin: Secondary | ICD-10-CM | POA: Diagnosis not present

## 2018-01-21 DIAGNOSIS — Z79899 Other long term (current) drug therapy: Secondary | ICD-10-CM | POA: Diagnosis not present

## 2018-01-21 DIAGNOSIS — Z853 Personal history of malignant neoplasm of breast: Secondary | ICD-10-CM | POA: Diagnosis not present

## 2018-01-21 DIAGNOSIS — G4733 Obstructive sleep apnea (adult) (pediatric): Secondary | ICD-10-CM | POA: Diagnosis not present

## 2018-01-21 DIAGNOSIS — Z955 Presence of coronary angioplasty implant and graft: Secondary | ICD-10-CM | POA: Diagnosis not present

## 2018-01-24 ENCOUNTER — Encounter (HOSPITAL_COMMUNITY): Payer: Medicare Other

## 2018-01-25 ENCOUNTER — Telehealth: Payer: Self-pay

## 2018-01-25 DIAGNOSIS — R748 Abnormal levels of other serum enzymes: Secondary | ICD-10-CM

## 2018-01-25 MED ORDER — ROSUVASTATIN CALCIUM 20 MG PO TABS: 20.0000 mg | ORAL_TABLET | Freq: Every day | ORAL | 11 refills | Status: DC

## 2018-01-25 NOTE — Telephone Encounter (Signed)
-----   Message from Sherren Mocha, MD sent at 01/21/2018  5:53 AM EDT ----- Reviewed. Lipids excellent. LFT's increased. Hold statin x 1 week, change to rosuvastatin 20 mg, minimize alcohol. Repeat LFT's 4 weeks. thx

## 2018-01-25 NOTE — Telephone Encounter (Signed)
Informed patient of results and verbal understanding expressed.   Instructed patient to STOP LIPITOR. She will START CRESTOR 20mg  on June 19. LFTs scheduled July 17. She understands to limit alcohol. She was grateful for call and agrees with treatment plan.

## 2018-01-26 ENCOUNTER — Encounter (HOSPITAL_COMMUNITY): Payer: Medicare Other

## 2018-01-27 ENCOUNTER — Encounter (HOSPITAL_COMMUNITY): Payer: Self-pay

## 2018-01-27 NOTE — Progress Notes (Signed)
Cardiac Individual Treatment Plan  Patient Details  Name: Tracey [name] MRN: [id number] Date of Birth: 05-17-1946 Referring Provider:     CARDIAC REHAB PHASE II ORIENTATION from 01/06/2018 in [facility]  Referring Provider  Sherren Mocha MD      Initial Encounter Date:    CARDIAC REHAB PHASE II ORIENTATION from 01/06/2018 in [facility]  Date  01/06/18  Referring Provider  Sherren Mocha MD      Visit Diagnosis: No diagnosis found.  Patient's Home Medications on Admission:  Current Outpatient Medications:  .  allopurinol (ZYLOPRIM) 100 MG tablet, TAKE 1 TABLET BY MOUTH ONCE DAILY, Disp: 90 tablet, Rfl: 0 .  ASPIRIN LOW DOSE 81 MG chewable tablet, CSW 1 T PO D, Disp: , Rfl: 0 .  celecoxib (CELEBREX) 200 MG capsule, TK ONE C PO QD, Disp: , Rfl: 3 .  cholecalciferol 5000 UNITS TABS, Take 0.2 tablets (1,000 Units total) by mouth daily., Disp: 100 tablet, Rfl: 3 .  clopidogrel (PLAVIX) 75 MG tablet, TK 1 T PO QD  TK 300 MG 4 T PO 1 TIME ON FIRST DAY THEN 75 MG QD, Disp: , Rfl: 0 .  colchicine 0.6 MG tablet, TAKE 1 TABLET BY MOUTH EVERY DAY OR AS NEEDED, Disp: 30 tablet, Rfl: 0 .  Cyanocobalamin (VITAMIN B-12 IJ), Inject 1,000 mcg as directed every 30 (thirty) days. Normally 1st of the month, Disp: , Rfl:  .  cyclobenzaprine (FLEXERIL) 10 MG tablet, TK 1 T PO Q 8 H PRF MUSCLE SPASMS, Disp: , Rfl: 0 .  exemestane (AROMASIN) 25 MG tablet, Take 1 tablet (25 mg total) by mouth daily after breakfast., Disp: 90 tablet, Rfl: 4 .  fluticasone (FLONASE) 50 MCG/ACT nasal spray, Place 1 spray into the nose daily as needed for rhinitis. , Disp: , Rfl:  .  furosemide (LASIX) 40 MG tablet, Take 40 mg by mouth daily as needed for fluid. , Disp: , Rfl:  .  lisinopril (PRINIVIL,ZESTRIL) 10 MG tablet, TK 1 T PO QD, Disp: , Rfl: 11 .  LORazepam (ATIVAN) 0.5 MG tablet, Take 0.5 mg by mouth at bedtime as needed for anxiety (or sleep)., Disp: ,  Rfl:  .  menthol-cetylpyridinium (CEPACOL) 3 MG lozenge, Take 1 lozenge (3 mg total) by mouth as needed for sore throat., Disp: 100 tablet, Rfl: 12 .  metoprolol tartrate (LOPRESSOR) 25 MG tablet, Take 0.5 tablets (12.5 mg total) by mouth 2 (two) times daily., Disp: 90 tablet, Rfl: 3 .  pantoprazole (PROTONIX) 40 MG tablet, Take 1 tablet (40 mg total) by mouth daily., Disp: 90 tablet, Rfl: 3 .  rosuvastatin (CRESTOR) 20 MG tablet, Take 1 tablet (20 mg total) by mouth daily., Disp: 30 tablet, Rfl: 11 .  Triamcinolone Acetonide (TRIAMCINOLONE 0.1 % CREAM : EUCERIN) CREA, Apply 1 application topically 3 (three) times daily., Disp: 1 each, Rfl: 1 .  venlafaxine XR (EFFEXOR-XR) 150 MG 24 hr capsule, TAKE 1 CAPSULE BY MOUTH DAILY WITH BREAKFAST, Disp: 90 capsule, Rfl: 0 .  venlafaxine XR (EFFEXOR-XR) 37.5 MG 24 hr capsule, Take 1 capsule (37.5 mg total) by mouth daily., Disp: 90 capsule, Rfl: 3  Past Medical History: Past Medical History:  Diagnosis Date  . Allergy   . Anxiety    takes Ativan daily prn  . Arthritis   . Breast cancer ([place]) 2014   ER+/PR+/HEr2-,   . Bruises easily   . Colon polyps    2 polyps by report  .  Diverticulosis   . GERD (gastroesophageal reflux disease)    occasionally takes Nexium   . Gout    takes Allopurinol daily and Colchicine daily prn;last attack 31yr ago  . Gout   . History of bladder infections    many yrs ago  . History of bronchitis    last time at least 830yrago  . History of stress incontinence   . Hyperlipidemia    takes Pravastatin daily  . Insomnia    takes Ambien nightly prn  . OSA (obstructive sleep apnea)    on CPAP  . Osteoarthritis   . Peripheral edema    takes Furosemide daily prn  . PONV (postoperative nausea and vomiting)   . Postmenopausal hormone therapy   . Radiation 07/27/13-09/07/13   Right Breast x 31 treatments  . Rhinitis    uses Flonase prn  . Sinus congestion   . Status post breast reconstruction 10/15/14   Bilateral  [place]  . Tachycardia    takes Metoprolol daily  . [place] 2009 with normal cardiac workup    Tobacco Use: Social History   Tobacco Use  Smoking Status Former Smoker  . Packs/day: 1.00  . Years: 20.00  . Pack years: 20.00  . Start date: 06/09/1992  Smokeless Tobacco Never Used  Tobacco Comment   quit at age 72  Labs: Recent Review Flowsheet Data    Labs for ITP Cardiac and Pulmonary Rehab Latest Ref Rng & Units 01/20/2018   Cholestrol 100 - 199 mg/dL 137   LDLCALC 0 - 99 mg/dL 64   HDL >39 mg/dL 59   Trlycerides 0 - 149 mg/dL 70      Capillary Blood Glucose: No results found for: GLUCAP   Exercise Target Goals:    Exercise Program Goal: Individual exercise prescription set using results from initial 6 min walk test and THRR while considering  patient's activity barriers and safety.   Exercise Prescription Goal: Initial exercise prescription builds to 30-45 minutes a day of aerobic activity, 2-3 days per week.  Home exercise guidelines will be given to patient during program as part of exercise prescription that the participant will acknowledge.  Activity Barriers & Risk Stratification: Activity Barriers & Cardiac Risk Stratification - 01/06/18 1511      Activity Barriers & Cardiac Risk Stratification   Activity Barriers  Back Problems;Balance Concerns;Other (comment)    Comments  Bilateral Knee Pain    Cardiac Risk Stratification  High       6 Minute Walk: 6 Minute Walk    Row Name 01/06/18 1507         6 Minute Walk   Phase  Initial     Distance  1238 feet     Walk Time  6 minutes     # of Rest Breaks  0     MPH  2.34     METS  2.4     RPE  11     Perceived Dyspnea   0     VO2 Peak  8.42     Symptoms  No     Resting HR  69 bpm     Resting BP  102/62     Resting Oxygen Saturation   100 %     Exercise Oxygen Saturation  during 6 min walk  100 %      Max Ex. HR  93 bpm  Max Ex. BP  120/60     2 Minute Post BP  102/70        Oxygen Initial Assessment:   Oxygen Re-Evaluation:   Oxygen Discharge (Final Oxygen Re-Evaluation):   Initial Exercise Prescription: Initial Exercise Prescription - 01/06/18 1500      Date of Initial Exercise RX and Referring Provider   Date  01/06/18    Referring Provider  Sherren Mocha MD      Recumbant Bike   Level  1.2    Watts  10    Minutes  10    METs  2.4      NuStep   Level  2    SPM  80    Minutes  10    METs  2.2      Arm Ergometer   Level  1    Watts  15    Minutes  10    METs  2.3      Prescription Details   Frequency (times per week)  3x    Duration  Progress to 30 minutes of continuous aerobic without signs/symptoms of physical distress      Intensity   THRR 40-80% of Max Heartrate  59-118    Ratings of Perceived Exertion  11-13    Perceived Dyspnea  0-4      Progression   Progression  Continue progressive overload as per policy without signs/symptoms or physical distress.      Resistance Training   Training Prescription  Yes    Weight  3lbs    Reps  10-15       Perform Capillary Blood Glucose checks as needed.  Exercise Prescription Changes: Exercise Prescription Changes    Row Name 01/12/18 1400 01/17/18 1423 01/21/18 1417         Response to Exercise   Blood Pressure (Admit)  116/64  108/70  108/60     Blood Pressure (Exercise)  128/68  124/70  122/80     Blood Pressure (Exit)  112/60  120/70  118/70     Heart Rate (Admit)  73 bpm  73 bpm  71 bpm     Heart Rate (Exercise)  77 bpm  101 bpm  96 bpm     Heart Rate (Exit)  57 bpm  72 bpm  64 bpm     Rating of Perceived Exertion (Exercise)  12  12  12      Perceived Dyspnea (Exercise)  0  0  0     Symptoms  None  None   None     Comments  Pt oriented to exericse equipment   -  -     Duration  Progress to 30 minutes of  aerobic without signs/symptoms of physical distress  Progress to 30 minutes  of  aerobic without signs/symptoms of physical distress  Continue with 30 min of aerobic exercise without signs/symptoms of physical distress.     Intensity  THRR unchanged  THRR unchanged  THRR unchanged       Progression   Progression  Continue to progress workloads to maintain intensity without signs/symptoms of physical distress.  Continue to progress workloads to maintain intensity without signs/symptoms of physical distress.  Continue to progress workloads to maintain intensity without signs/symptoms of physical distress.     Average METs  2.4  2.6  2.96       Resistance Training   Training Prescription  No  Yes  Yes     Weight  -  3lbs  3lbs     Reps  -  10-15  10-15     Time  -  10 Minutes  10 Minutes       Interval Training   Interval Training  No  No  No       Recumbant Bike   Level  -  1.2  1.2     Watts  -  10  22     Minutes  -  10  10     METs  -  2  2.97       NuStep   Level  4  4  4      SPM  85  85  95     Minutes  15  10  10      METs  2.4  2.8  2.9       Arm Ergometer   Level  2  1  1      Watts  10  10  15      Minutes  15  10  10      METs  2.3  3  3.01        Exercise Comments: Exercise Comments    Row Name 01/12/18 1406 01/26/18 1422         Exercise Comments  Today was pt first day of exercise. Pt oriented to exercise equipment. Pt is off to a good start with exercise.   Reviewed METs with pt. Pt will be on vacation for two weeks. Will review Home Exercise Program and goals with pt upon return. Pt plans to walk and golf on vacation.          Exercise Goals and Review: Exercise Goals    Row Name 01/06/18 1511             Exercise Goals   Increase Physical Activity  Yes       Intervention  Provide advice, education, support and counseling about physical activity/exercise needs.;Develop an individualized exercise prescription for aerobic and resistive training based on initial evaluation findings, risk stratification, comorbidities and  participant's personal goals.       Expected Outcomes  Short Term: Attend rehab on a regular basis to increase amount of physical activity.;Long Term: Add in home exercise to make exercise part of routine and to increase amount of physical activity.;Long Term: Exercising regularly at least 3-5 days a week.       Increase Strength and Stamina  Yes       Intervention  Provide advice, education, support and counseling about physical activity/exercise needs.;Develop an individualized exercise prescription for aerobic and resistive training based on initial evaluation findings, risk stratification, comorbidities and participant's personal goals.       Expected Outcomes  Short Term: Increase workloads from initial exercise prescription for resistance, speed, and METs.;Short Term: Perform resistance training exercises routinely during rehab and add in resistance training at home;Long Term: Improve cardiorespiratory fitness, muscular endurance and strength as measured by increased METs and functional capacity (6MWT)       Able to understand and use rate of perceived exertion (RPE) scale  Yes       Intervention  Provide education and explanation on how to use RPE scale       Expected Outcomes  Short Term: Able to use RPE daily in rehab to express subjective intensity level;Long Term:  Able to use RPE to guide intensity level when exercising independently       Knowledge and understanding of  Target Heart Rate Range (THRR)  Yes       Intervention  Provide education and explanation of THRR including how the numbers were predicted and where they are located for reference       Expected Outcomes  Short Term: Able to state/look up THRR;Short Term: Able to use daily as guideline for intensity in rehab;Long Term: Able to use THRR to govern intensity when exercising independently       Able to check pulse independently  Yes       Intervention  Provide education and demonstration on how to check pulse in carotid and radial  arteries.;Review the importance of being able to check your own pulse for safety during independent exercise       Expected Outcomes  Long Term: Able to check pulse independently and accurately;Short Term: Able to explain why pulse checking is important during independent exercise       Understanding of Exercise Prescription  Yes       Intervention  Provide education, explanation, and written materials on patient's individual exercise prescription       Expected Outcomes  Short Term: Able to explain program exercise prescription;Long Term: Able to explain home exercise prescription to exercise independently          Exercise Goals Re-Evaluation :    Discharge Exercise Prescription (Final Exercise Prescription Changes): Exercise Prescription Changes - 01/21/18 1417      Response to Exercise   Blood Pressure (Admit)  108/60    Blood Pressure (Exercise)  122/80    Blood Pressure (Exit)  118/70    Heart Rate (Admit)  71 bpm    Heart Rate (Exercise)  96 bpm    Heart Rate (Exit)  64 bpm    Rating of Perceived Exertion (Exercise)  12    Perceived Dyspnea (Exercise)  0    Symptoms  None    Duration  Continue with 30 min of aerobic exercise without signs/symptoms of physical distress.    Intensity  THRR unchanged      Progression   Progression  Continue to progress workloads to maintain intensity without signs/symptoms of physical distress.    Average METs  2.96      Resistance Training   Training Prescription  Yes    Weight  3lbs    Reps  10-15    Time  10 Minutes      Interval Training   Interval Training  No      Recumbant Bike   Level  1.2    Watts  22    Minutes  10    METs  2.97      NuStep   Level  4    SPM  95    Minutes  10    METs  2.9      Arm Ergometer   Level  1    Watts  15    Minutes  10    METs  3.01       Nutrition:  Target Goals: Understanding of nutrition guidelines, daily intake of sodium <1562m, cholesterol <2062m calories 30% from fat and  7% or less from saturated fats, daily to have 5 or more servings of fruits and vegetables.  Biometrics: Pre Biometrics - 01/06/18 1512      Pre Biometrics   Height  5' 1.5" (1.562 m)    Weight  144 lb 13.5 oz (65.7 kg)    Waist Circumference  38 inches    Hip Circumference  39.5 inches    Waist to Hip Ratio  0.96 %    BMI (Calculated)  26.93    Triceps Skinfold  27 mm    % Body Fat  40.3 %    Grip Strength  25 kg    Flexibility  25 in    Single Leg Stand  18.1 seconds        Nutrition Therapy Plan and Nutrition Goals: Nutrition Therapy & Goals - 01/07/18 0800      Nutrition Therapy   Diet  Heart Healthy      Personal Nutrition Goals   Nutrition Goal  Pt to identify food quantities necessary to achieve weight loss of 8 lb at graduation from cardiac rehab.      Intervention Plan   Intervention  Prescribe, educate and counsel regarding individualized specific dietary modifications aiming towards targeted core components such as weight, hypertension, lipid management, diabetes, heart failure and other comorbidities.    Expected Outcomes  Short Term Goal: Understand basic principles of dietary content, such as calories, fat, sodium, cholesterol and nutrients.;Long Term Goal: Adherence to prescribed nutrition plan.       Nutrition Assessments: Nutrition Assessments - 01/07/18 0800      MEDFICTS Scores   Pre Score  18       Nutrition Goals Re-Evaluation:   Nutrition Goals Re-Evaluation:   Nutrition Goals Discharge (Final Nutrition Goals Re-Evaluation):   Psychosocial: Target Goals: Acknowledge presence or absence of significant depression and/or stress, maximize coping skills, provide positive support system. Participant is able to verbalize types and ability to use techniques and skills needed for reducing stress and depression.  Initial Review & Psychosocial Screening: Initial Psych Review & Screening - 01/06/18 1610      Initial Review   Current issues with   Current Anxiety/Panic;Current Stress Concerns    Comments  Elnora exhibits stress related to her illness.  She is fearful and anxious after her CV event.       Family Dynamics   Good Support System?  Yes Pt reports that her husband and daughters are sources of support for her.      Barriers   Psychosocial barriers to participate in program  The patient should benefit from training in stress management and relaxation.      Screening Interventions   Interventions  Encouraged to exercise    Expected Outcomes  Short Term goal: Utilizing psychosocial counselor, staff and physician to assist with identification of specific Stressors or current issues interfering with healing process. Setting desired goal for each stressor or current issue identified.;Long Term Goal: Stressors or current issues are controlled or eliminated.       Quality of Life Scores: Quality of Life - 01/06/18 1514      Quality of Life Scores   Health/Function Pre  20.57 %    Socioeconomic Pre  27 %    Psych/Spiritual Pre  25.5 %    Family Pre  26.38 %    GLOBAL Pre  23.78 %      Scores of 19 and below usually indicate a poorer quality of life in these areas.  A difference of  2-3 points is a clinically meaningful difference.  A difference of 2-3 points in the total score of the Quality of Life Index has been associated with significant improvement in overall quality of life, self-image, physical symptoms, and general health in studies assessing change in quality of life.  PHQ-9: Recent Review Flowsheet Data    Depression screen PHQ  2/9 01/12/2018 01/06/2017 03/30/2016 02/10/2015   Decreased Interest 0 0 0 0   Down, Depressed, Hopeless 0 0 0 0   PHQ - 2 Score 0 0 0 0     Interpretation of Total Score  Total Score Depression Severity:  1-4 = Minimal depression, 5-9 = Mild depression, 10-14 = Moderate depression, 15-19 = Moderately severe depression, 20-27 = Severe depression   Psychosocial Evaluation and  Intervention: Psychosocial Evaluation - 01/12/18 1705      Psychosocial Evaluation & Interventions   Interventions  Stress management education;Relaxation education;Encouraged to exercise with the program and follow exercise prescription    Comments  Debhora has stress r/t her CV event.  She declines utilizing the spiritual care department at this time. Will continue to monitor.  Pt enjoys golfing, gardening, and spending time with her grandkids.    Expected Outcomes  Pt will exhibit a positive outlook utilizing good coping skills.     Continue Psychosocial Services   Follow up required by staff       Psychosocial Re-Evaluation: Psychosocial Re-Evaluation    [place] Name 01/27/18 1125             Psychosocial Re-Evaluation   Current issues with  None Identified       Comments  Tonie feels that she is managing her stress r/t to her CV event well.  No intervention necessary at this time.       Expected Outcomes  Jeanna will exhibit a positive outlook with good coping skills.       Interventions  Encouraged to attend Cardiac Rehabilitation for the exercise       Continue Psychosocial Services   No Follow up required          Psychosocial Discharge (Final Psychosocial Re-Evaluation): Psychosocial Re-Evaluation - 01/27/18 1125      Psychosocial Re-Evaluation   Current issues with  None Identified    Comments  Victorina feels that she is managing her stress r/t to her CV event well.  No intervention necessary at this time.    Expected Outcomes  [place] will exhibit a positive outlook with good coping skills.    Interventions  Encouraged to attend Cardiac Rehabilitation for the exercise    Continue Psychosocial Services   No Follow up required       Vocational Rehabilitation: Provide vocational rehab assistance to qualifying candidates.   Vocational Rehab Evaluation & Intervention: Vocational Rehab - 01/06/18 1610      Initial Vocational Rehab Evaluation & Intervention   Assessment shows  need for Vocational Rehabilitation  No       Education: Education Goals: Education classes will be provided on a weekly basis, covering required topics. Participant will state understanding/return demonstration of topics presented.  Learning Barriers/Preferences: Learning Barriers/Preferences - 01/06/18 1514      Learning Barriers/Preferences   Learning Barriers  Sight    Learning Preferences  Skilled Demonstration;Pictoral;Verbal Instruction;Individual Instruction       Education Topics: Count Your Pulse:  -Group instruction provided by verbal instruction, demonstration, patient participation and written materials to support subject.  Instructors address importance of being able to find your pulse and how to count your pulse when at home without a heart monitor.  Patients get hands on experience counting their pulse with staff help and individually.   Heart Attack, Angina, and Risk Factor Modification:  -Group instruction provided by verbal instruction, video, and written materials to support subject.  Instructors address signs and symptoms of angina and heart attacks.  Also discuss risk factors for heart disease and how to make changes to improve heart health risk factors.   Functional Fitness:  -Group instruction provided by verbal instruction, demonstration, patient participation, and written materials to support subject.  Instructors address safety measures for doing things around the house.  Discuss how to get up and down off the floor, how to pick things up properly, how to safely get out of a chair without assistance, and balance training.   Meditation and Mindfulness:  -Group instruction provided by verbal instruction, patient participation, and written materials to support subject.  Instructor addresses importance of mindfulness and meditation practice to help reduce stress and improve awareness.  Instructor also leads participants through a meditation exercise.     Stretching for Flexibility and Mobility:  -Group instruction provided by verbal instruction, patient participation, and written materials to support subject.  Instructors lead participants through series of stretches that are designed to increase flexibility thus improving mobility.  These stretches are additional exercise for major muscle groups that are typically performed during regular warm up and cool down.   Hands Only CPR:  -Group verbal, video, and participation provides a basic overview of AHA guidelines for community CPR. Role-play of emergencies allow participants the opportunity to practice calling for help and chest compression technique with discussion of AED use.   Hypertension: -Group verbal and written instruction that provides a basic overview of hypertension including the most recent diagnostic guidelines, risk factor reduction with self-care instructions and medication management.    Nutrition I class: Heart Healthy Eating:  -Group instruction provided by PowerPoint slides, verbal discussion, and written materials to support subject matter. The instructor gives an explanation and review of the Therapeutic Lifestyle Changes diet recommendations, which includes a discussion on lipid goals, dietary fat, sodium, fiber, plant stanol/sterol esters, sugar, and the components of a well-balanced, healthy diet.   Nutrition II class: Lifestyle Skills:  -Group instruction provided by PowerPoint slides, verbal discussion, and written materials to support subject matter. The instructor gives an explanation and review of label reading, grocery shopping for heart health, heart healthy recipe modifications, and ways to make healthier choices when eating out.   Diabetes Question & Answer:  -Group instruction provided by PowerPoint slides, verbal discussion, and written materials to support subject matter. The instructor gives an explanation and review of diabetes co-morbidities, pre-  and post-prandial blood glucose goals, pre-exercise blood glucose goals, signs, symptoms, and treatment of hypoglycemia and hyperglycemia, and foot care basics.   Diabetes Blitz:  -Group instruction provided by PowerPoint slides, verbal discussion, and written materials to support subject matter. The instructor gives an explanation and review of the physiology behind type 1 and type 2 diabetes, diabetes medications and rational behind using different medications, pre- and post-prandial blood glucose recommendations and Hemoglobin A1c goals, diabetes diet, and exercise including blood glucose guidelines for exercising safely.    Portion Distortion:  -Group instruction provided by PowerPoint slides, verbal discussion, written materials, and food models to support subject matter. The instructor gives an explanation of serving size versus portion size, changes in portions sizes over the last 20 years, and what consists of a serving from each food group.   Stress Management:  -Group instruction provided by verbal instruction, video, and written materials to support subject matter.  Instructors review role of stress in heart disease and how to cope with stress positively.     CARDIAC REHAB PHASE II EXERCISE from 01/19/2018 in [place]  Date  01/12/18  Instruction Review Code  2- Demonstrated Understanding      Exercising on Your Own:  -Group instruction provided by verbal instruction, power point, and written materials to support subject.  Instructors discuss benefits of exercise, components of exercise, frequency and intensity of exercise, and end points for exercise.  Also discuss use of nitroglycerin and activating EMS.  Review options of places to exercise outside of rehab.  Review guidelines for sex with heart disease.   Cardiac Drugs I:  -Group instruction provided by verbal instruction and written materials to support subject.  Instructor reviews cardiac drug  classes: antiplatelets, anticoagulants, beta blockers, and statins.  Instructor discusses reasons, side effects, and lifestyle considerations for each drug class.   Cardiac Drugs II:  -Group instruction provided by verbal instruction and written materials to support subject.  Instructor reviews cardiac drug classes: angiotensin converting enzyme inhibitors (ACE-I), angiotensin II receptor blockers (ARBs), nitrates, and calcium channel blockers.  Instructor discusses reasons, side effects, and lifestyle considerations for each drug class.   Anatomy and Physiology of the Circulatory System:  Group verbal and written instruction and models provide basic cardiac anatomy and physiology, with the coronary electrical and arterial systems. Review of: AMI, Angina, Valve disease, Heart Failure, Peripheral Artery Disease, Cardiac Arrhythmia, Pacemakers, and the ICD.   CARDIAC REHAB PHASE II EXERCISE from 01/19/2018 in [place]  Date  01/19/18  Instruction Review Code  2- Demonstrated Understanding      Other Education:  -Group or individual verbal, written, or video instructions that support the educational goals of the cardiac rehab program.   Holiday Eating Survival Tips:  -Group instruction provided by PowerPoint slides, verbal discussion, and written materials to support subject matter. The instructor gives patients tips, tricks, and techniques to help them not only survive but enjoy the holidays despite the onslaught of food that accompanies the holidays.   Knowledge Questionnaire Score: Knowledge Questionnaire Score - 01/06/18 1514      Knowledge Questionnaire Score   Pre Score  22/24       Core Components/Risk Factors/Patient Goals at Admission: Personal Goals and Risk Factors at Admission - 01/06/18 1506      Core Components/Risk Factors/Patient Goals on Admission    Weight Management  Yes;Weight Loss    Intervention  Weight Management: Develop a  combined nutrition and exercise program designed to reach desired caloric intake, while maintaining appropriate intake of nutrient and fiber, sodium and fats, and appropriate energy expenditure required for the weight goal.;Weight Management: Provide education and appropriate resources to help participant work on and attain dietary goals.    Admit Weight  144 lb 13.5 oz (65.7 kg)    Goal Weight: Short Term  139 lb (63 kg)    Goal Weight: Long Term  134 lb (60.8 kg)    Expected Outcomes  Long Term: Adherence to nutrition and physical activity/exercise program aimed toward attainment of established weight goal;Short Term: Continue to assess and modify interventions until short term weight is achieved;Weight Loss: Understanding of general recommendations for a balanced deficit meal plan, which promotes 1-2 lb weight loss per week and includes a negative energy balance of [phone] kcal/d;Understanding recommendations for meals to include 15-35% energy as protein, 25-35% energy from fat, 35-60% energy from carbohydrates, less than 271m of dietary cholesterol, 20-35 gm of total fiber daily;Understanding of distribution of calorie intake throughout the day with the consumption of 4-5 meals/snacks    Hypertension  Yes    Intervention  Provide education on lifestyle modifcations including regular physical activity/exercise, weight management, moderate sodium restriction and increased consumption of fresh fruit, vegetables, and low fat dairy, alcohol moderation, and smoking cessation.;Monitor prescription use compliance.    Expected Outcomes  Short Term: Continued assessment and intervention until BP is < 140/60m HG in hypertensive participants. < 130/833mHG in hypertensive participants with diabetes, heart failure or chronic kidney disease.;Long Term: Maintenance of blood pressure at goal levels.    Lipids  Yes    Intervention  Provide education and support for participant on nutrition & aerobic/resistive  exercise along with prescribed medications to achieve LDL <7010mHDL >29m7m  Expected Outcomes  Short Term: Participant states understanding of desired cholesterol values and is compliant with medications prescribed. Participant is following exercise prescription and nutrition guidelines.;Long Term: Cholesterol controlled with medications as prescribed, with individualized exercise RX and with personalized nutrition plan. Value goals: LDL < 70mg63mL > 40 mg.    Stress  Yes    Intervention  Offer individual and/or small group education and counseling on adjustment to heart disease, stress management and health-related lifestyle change. Teach and support self-help strategies.;Refer participants experiencing significant psychosocial distress to appropriate mental health specialists for further evaluation and treatment. When possible, include family members and significant others in education/counseling sessions.    Expected Outcomes  Short Term: Participant demonstrates changes in health-related behavior, relaxation and other stress management skills, ability to obtain effective social support, and compliance with psychotropic medications if prescribed.;Long Term: Emotional wellbeing is indicated by absence of clinically significant psychosocial distress or social isolation.       Core Components/Risk Factors/Patient Goals Review:  Goals and Risk Factor Review    Row Name 01/12/18 1704 01/27/18 1124           Core Components/Risk Factors/Patient Goals Review   Personal Goals Review  Weight Management/Obesity;Lipids;Hypertension;Stress  Weight Management/Obesity;Lipids;Hypertension;Stress      Review  Pt with multiple CAD RF willing to resume CR exercise.  Pt is hoping to be able to get back to golfing 18 holes.  Pt with multiple CAD RF willing to resume CR exercise.  Pt is hoping to be able to get back to golfing 18 holes and will be walking and golfing during her upcoming vacation.      Expected  Outcomes  Pt with multiple CAD RF will participate in CR exercise, nutrtition, and lifestyle modification opportunities.   Pt with multiple CAD RF will participate in CR exercise, nutrtition, and lifestyle modification opportunities.          Core Components/Risk Factors/Patient Goals at Discharge (Final Review):  Goals and Risk Factor Review - 01/27/18 1124      Core Components/Risk Factors/Patient Goals Review   Personal Goals Review  Weight Management/Obesity;Lipids;Hypertension;Stress    Review  Pt with multiple CAD RF willing to resume CR exercise.  Pt is hoping to be able to get back to golfing 18 holes and will be walking and golfing during her upcoming vacation.    Expected Outcomes  Pt with multiple CAD RF will participate in CR exercise, nutrtition, and lifestyle modification opportunities.        ITP Comments: ITP Comments    Row Name 01/06/18 1454 01/27/18 1124         ITP Comments  Dr. TraciFransico Himical Director  30 Day ITP Review.  JanetBethzyolerating exercise well.  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  JanetKristelle be on  vacation and plans to exercise.         Comments: See ITP Comments

## 2018-01-28 ENCOUNTER — Encounter (HOSPITAL_COMMUNITY): Admission: RE | Admit: 2018-01-28 | Payer: Medicare Other | Source: Ambulatory Visit

## 2018-01-28 ENCOUNTER — Encounter (HOSPITAL_COMMUNITY): Payer: Medicare Other

## 2018-01-31 ENCOUNTER — Encounter (HOSPITAL_COMMUNITY): Payer: Medicare Other

## 2018-01-31 ENCOUNTER — Encounter (HOSPITAL_COMMUNITY)
Admission: RE | Admit: 2018-01-31 | Discharge: 2018-01-31 | Disposition: A | Discharge status: Home / Self Care | Payer: Medicare Other | Source: Ambulatory Visit | Attending: Cardiovascular Disease | Admitting: Cardiovascular Disease

## 2018-01-31 DIAGNOSIS — Z955 Presence of coronary angioplasty implant and graft: Secondary | ICD-10-CM | POA: Diagnosis not present

## 2018-01-31 DIAGNOSIS — Z7982 Long term (current) use of aspirin: Secondary | ICD-10-CM | POA: Diagnosis not present

## 2018-01-31 DIAGNOSIS — I214 Non-ST elevation (NSTEMI) myocardial infarction: Secondary | ICD-10-CM

## 2018-01-31 DIAGNOSIS — G4733 Obstructive sleep apnea (adult) (pediatric): Secondary | ICD-10-CM | POA: Diagnosis not present

## 2018-01-31 DIAGNOSIS — Z853 Personal history of malignant neoplasm of breast: Secondary | ICD-10-CM | POA: Diagnosis not present

## 2018-01-31 DIAGNOSIS — Z79899 Other long term (current) drug therapy: Secondary | ICD-10-CM | POA: Diagnosis not present

## 2018-01-31 NOTE — Progress Notes (Signed)
Tracey [name] 72 y.o. female Nutrition Note Spoke with pt. Nutrition Plan and Nutrition Survey goals reviewed with pt. Pt is following a Heart Healthy diet. Pt wants to lose wt, about 8 lbs, has lost 3 since starting. Reports a weight of 141 lbs today. Pt has been trying to lose wt by following a heart healthy diet, by limiting portion sizes, and making healthier food choices. Wt loss tips reviewed with patient at this session. Additionally distributed handouts for label reading, heart healthy nutrition therapy. Pt with dx of CHF. Per discussion, pt does not use canned/convenience foods often. Pt rarely adds salt to food. Pt eats out infrequently. Pt expressed understanding of the information reviewed. Pt aware of nutrition education classes offered.   Wt Readings from Last 10 Encounters:  01/14/18 144 lb 12.8 oz (65.7 kg)  01/06/18 144 lb 13.5 oz (65.7 kg)  12/13/17 141 lb 14.4 oz (64.4 kg)  07/02/17 154 lb 4.8 oz (70 kg)  05/24/17 152 lb (68.9 kg)  02/08/17 149 lb 14.4 oz (68 kg)  01/06/17 148 lb (67.1 kg)  12/21/16 151 lb (68.5 kg)  03/30/16 150 lb (68 kg)  02/26/16 150 lb (68 kg)     Nutrition Diagnosis ? Food-and nutrition-related knowledge deficit related to lack of exposure to information as related to diagnosis of: ? CVD  ? Overweight related to excessive energy intake as evidenced by a BMI of 26.2  Nutrition Intervention ? Pt's individual nutrition plan reviewed with pt.  Goal(s)  ? Pt to identify food quantities necessary to achieve weight loss of 8 lb at graduation from cardiac rehab.   Laurina Bustle, MS, RD, LDN 01/31/2018 12:39 PM

## 2018-02-01 DIAGNOSIS — R82998 Other abnormal findings in urine: Secondary | ICD-10-CM | POA: Diagnosis not present

## 2018-02-01 DIAGNOSIS — E538 Deficiency of other specified B group vitamins: Secondary | ICD-10-CM | POA: Diagnosis not present

## 2018-02-01 DIAGNOSIS — I10 Essential (primary) hypertension: Secondary | ICD-10-CM | POA: Diagnosis not present

## 2018-02-01 DIAGNOSIS — E7849 Other hyperlipidemia: Secondary | ICD-10-CM | POA: Diagnosis not present

## 2018-02-01 DIAGNOSIS — M109 Gout, unspecified: Secondary | ICD-10-CM | POA: Diagnosis not present

## 2018-02-02 ENCOUNTER — Encounter (HOSPITAL_COMMUNITY)
Admission: RE | Admit: 2018-02-02 | Discharge: 2018-02-02 | Disposition: A | Discharge status: Home / Self Care | Payer: Medicare Other | Source: Ambulatory Visit | Attending: Cardiovascular Disease | Admitting: Cardiovascular Disease

## 2018-02-02 ENCOUNTER — Encounter (HOSPITAL_COMMUNITY): Payer: Medicare Other

## 2018-02-02 DIAGNOSIS — I214 Non-ST elevation (NSTEMI) myocardial infarction: Secondary | ICD-10-CM | POA: Diagnosis not present

## 2018-02-02 DIAGNOSIS — Z7982 Long term (current) use of aspirin: Secondary | ICD-10-CM | POA: Diagnosis not present

## 2018-02-02 DIAGNOSIS — Z853 Personal history of malignant neoplasm of breast: Secondary | ICD-10-CM | POA: Diagnosis not present

## 2018-02-02 DIAGNOSIS — Z79899 Other long term (current) drug therapy: Secondary | ICD-10-CM | POA: Diagnosis not present

## 2018-02-02 DIAGNOSIS — Z955 Presence of coronary angioplasty implant and graft: Secondary | ICD-10-CM

## 2018-02-02 DIAGNOSIS — G4733 Obstructive sleep apnea (adult) (pediatric): Secondary | ICD-10-CM | POA: Diagnosis not present

## 2018-02-04 ENCOUNTER — Encounter (HOSPITAL_COMMUNITY)
Admission: RE | Admit: 2018-02-04 | Discharge: 2018-02-04 | Disposition: A | Discharge status: Home / Self Care | Payer: Medicare Other | Source: Ambulatory Visit | Attending: Cardiovascular Disease | Admitting: Cardiovascular Disease

## 2018-02-04 ENCOUNTER — Encounter (HOSPITAL_COMMUNITY): Payer: Medicare Other

## 2018-02-04 DIAGNOSIS — I214 Non-ST elevation (NSTEMI) myocardial infarction: Secondary | ICD-10-CM

## 2018-02-04 DIAGNOSIS — Z955 Presence of coronary angioplasty implant and graft: Secondary | ICD-10-CM

## 2018-02-04 NOTE — Progress Notes (Signed)
I have reviewed a Home Exercise Prescription with [name] . Sophiana is not currently exercising at home. The patient will plan to swim/water aerobics for exercise, 2-3 days a week for 30-45 minutes. Pt states she has nerve pain while walking and did not plan to walk for exercise.  Marcie Bal and I discussed how to progress their exercise prescription. The patient stated that they understand the exercise prescription. We reviewed exercise guidelines, target heart rate during exercise, RPE Scale, weather conditions, NTG use, endpoints for exercise, warmup and cool down. Patient is encouraged to come to me with any questions. I will continue to follow up with the patient to assist them with progression and safety.    Carma Lair MS, ACSM CEP 3:23 PM 02/04/2018

## 2018-02-07 ENCOUNTER — Encounter (HOSPITAL_COMMUNITY): Payer: Medicare Other

## 2018-02-07 DIAGNOSIS — Z6826 Body mass index (BMI) 26.0-26.9, adult: Secondary | ICD-10-CM | POA: Diagnosis not present

## 2018-02-07 DIAGNOSIS — M5416 Radiculopathy, lumbar region: Secondary | ICD-10-CM | POA: Diagnosis not present

## 2018-02-07 DIAGNOSIS — Z1389 Encounter for screening for other disorder: Secondary | ICD-10-CM | POA: Diagnosis not present

## 2018-02-07 DIAGNOSIS — C50911 Malignant neoplasm of unspecified site of right female breast: Secondary | ICD-10-CM | POA: Diagnosis not present

## 2018-02-07 DIAGNOSIS — M1712 Unilateral primary osteoarthritis, left knee: Secondary | ICD-10-CM | POA: Diagnosis not present

## 2018-02-07 DIAGNOSIS — M1711 Unilateral primary osteoarthritis, right knee: Secondary | ICD-10-CM | POA: Diagnosis not present

## 2018-02-07 DIAGNOSIS — R740 Nonspecific elevation of levels of transaminase and lactic acid dehydrogenase [LDH]: Secondary | ICD-10-CM | POA: Diagnosis not present

## 2018-02-07 DIAGNOSIS — Z0000 Encounter for general adult medical examination without abnormal findings: Secondary | ICD-10-CM | POA: Diagnosis not present

## 2018-02-07 DIAGNOSIS — I25118 Atherosclerotic heart disease of native coronary artery with other forms of angina pectoris: Secondary | ICD-10-CM | POA: Diagnosis not present

## 2018-02-07 DIAGNOSIS — I10 Essential (primary) hypertension: Secondary | ICD-10-CM | POA: Diagnosis not present

## 2018-02-07 DIAGNOSIS — F418 Other specified anxiety disorders: Secondary | ICD-10-CM | POA: Diagnosis not present

## 2018-02-07 DIAGNOSIS — E7849 Other hyperlipidemia: Secondary | ICD-10-CM | POA: Diagnosis not present

## 2018-02-08 ENCOUNTER — Other Ambulatory Visit: Payer: Self-pay

## 2018-02-08 DIAGNOSIS — C50411 Malignant neoplasm of upper-outer quadrant of right female breast: Secondary | ICD-10-CM

## 2018-02-08 MED ORDER — VENLAFAXINE HCL ER 37.5 MG PO CP24: 37.5000 mg | ORAL_CAPSULE | Freq: Every day | ORAL | 0 refills | Status: DC

## 2018-02-08 NOTE — Telephone Encounter (Signed)
  Pt calling to request refill on her effexor low dose 37.5. She states that she doesn't take this dose as much and it has been a while. 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 She currently takes 150mg  effexor daily for her hot flashes. Pt states that it helps with getting her emotions under control. Per Dr.Magrinat, okay to refill this today.

## 2018-02-09 ENCOUNTER — Encounter (HOSPITAL_COMMUNITY): Payer: Medicare Other

## 2018-02-09 ENCOUNTER — Encounter (HOSPITAL_COMMUNITY)
Admission: RE | Admit: 2018-02-09 | Discharge: 2018-02-09 | Disposition: A | Discharge status: Home / Self Care | Payer: Medicare Other | Source: Ambulatory Visit | Attending: Cardiovascular Disease | Admitting: Cardiovascular Disease

## 2018-02-09 DIAGNOSIS — Z955 Presence of coronary angioplasty implant and graft: Secondary | ICD-10-CM | POA: Diagnosis not present

## 2018-02-09 DIAGNOSIS — Z7982 Long term (current) use of aspirin: Secondary | ICD-10-CM | POA: Diagnosis not present

## 2018-02-09 DIAGNOSIS — I214 Non-ST elevation (NSTEMI) myocardial infarction: Secondary | ICD-10-CM

## 2018-02-09 DIAGNOSIS — Z853 Personal history of malignant neoplasm of breast: Secondary | ICD-10-CM | POA: Diagnosis not present

## 2018-02-09 DIAGNOSIS — Z79899 Other long term (current) drug therapy: Secondary | ICD-10-CM | POA: Diagnosis not present

## 2018-02-09 DIAGNOSIS — G4733 Obstructive sleep apnea (adult) (pediatric): Secondary | ICD-10-CM | POA: Diagnosis not present

## 2018-02-11 ENCOUNTER — Encounter (HOSPITAL_COMMUNITY): Payer: Medicare Other

## 2018-02-11 ENCOUNTER — Encounter (HOSPITAL_COMMUNITY)
Admission: RE | Admit: 2018-02-11 | Discharge: 2018-02-11 | Disposition: A | Discharge status: Home / Self Care | Payer: Medicare Other | Source: Ambulatory Visit | Attending: Cardiovascular Disease | Admitting: Cardiovascular Disease

## 2018-02-11 DIAGNOSIS — Z7982 Long term (current) use of aspirin: Secondary | ICD-10-CM | POA: Diagnosis not present

## 2018-02-11 DIAGNOSIS — Z853 Personal history of malignant neoplasm of breast: Secondary | ICD-10-CM | POA: Diagnosis not present

## 2018-02-11 DIAGNOSIS — Z79899 Other long term (current) drug therapy: Secondary | ICD-10-CM | POA: Diagnosis not present

## 2018-02-11 DIAGNOSIS — I214 Non-ST elevation (NSTEMI) myocardial infarction: Secondary | ICD-10-CM

## 2018-02-11 DIAGNOSIS — Z955 Presence of coronary angioplasty implant and graft: Secondary | ICD-10-CM

## 2018-02-11 DIAGNOSIS — G4733 Obstructive sleep apnea (adult) (pediatric): Secondary | ICD-10-CM | POA: Diagnosis not present

## 2018-02-14 ENCOUNTER — Encounter (HOSPITAL_COMMUNITY): Payer: Medicare Other

## 2018-02-16 ENCOUNTER — Encounter (HOSPITAL_COMMUNITY): Payer: Medicare Other

## 2018-02-18 ENCOUNTER — Encounter (HOSPITAL_COMMUNITY): Payer: Medicare Other

## 2018-02-21 ENCOUNTER — Encounter (HOSPITAL_COMMUNITY)
Admission: RE | Admit: 2018-02-21 | Discharge: 2018-02-21 | Disposition: A | Discharge status: Home / Self Care | Payer: Medicare Other | Source: Ambulatory Visit | Attending: Cardiovascular Disease | Admitting: Cardiovascular Disease

## 2018-02-21 ENCOUNTER — Encounter (HOSPITAL_COMMUNITY): Payer: Medicare Other

## 2018-02-21 DIAGNOSIS — I214 Non-ST elevation (NSTEMI) myocardial infarction: Secondary | ICD-10-CM

## 2018-02-21 DIAGNOSIS — Z87891 Personal history of nicotine dependence: Secondary | ICD-10-CM | POA: Insufficient documentation

## 2018-02-21 DIAGNOSIS — E785 Hyperlipidemia, unspecified: Secondary | ICD-10-CM | POA: Diagnosis not present

## 2018-02-21 DIAGNOSIS — Z79899 Other long term (current) drug therapy: Secondary | ICD-10-CM | POA: Insufficient documentation

## 2018-02-21 DIAGNOSIS — Z7982 Long term (current) use of aspirin: Secondary | ICD-10-CM | POA: Insufficient documentation

## 2018-02-21 DIAGNOSIS — Z853 Personal history of malignant neoplasm of breast: Secondary | ICD-10-CM | POA: Diagnosis not present

## 2018-02-21 DIAGNOSIS — Z955 Presence of coronary angioplasty implant and graft: Secondary | ICD-10-CM | POA: Insufficient documentation

## 2018-02-21 DIAGNOSIS — G4733 Obstructive sleep apnea (adult) (pediatric): Secondary | ICD-10-CM | POA: Insufficient documentation

## 2018-02-23 ENCOUNTER — Encounter (HOSPITAL_COMMUNITY)
Admission: RE | Admit: 2018-02-23 | Discharge: 2018-02-23 | Disposition: A | Discharge status: Home / Self Care | Payer: Medicare Other | Source: Ambulatory Visit | Attending: Cardiovascular Disease | Admitting: Cardiovascular Disease

## 2018-02-23 ENCOUNTER — Encounter (HOSPITAL_COMMUNITY): Payer: Self-pay

## 2018-02-23 ENCOUNTER — Encounter (HOSPITAL_COMMUNITY): Payer: Medicare Other

## 2018-02-23 DIAGNOSIS — I214 Non-ST elevation (NSTEMI) myocardial infarction: Secondary | ICD-10-CM | POA: Diagnosis not present

## 2018-02-23 DIAGNOSIS — Z7982 Long term (current) use of aspirin: Secondary | ICD-10-CM | POA: Diagnosis not present

## 2018-02-23 DIAGNOSIS — G4733 Obstructive sleep apnea (adult) (pediatric): Secondary | ICD-10-CM | POA: Diagnosis not present

## 2018-02-23 DIAGNOSIS — Z79899 Other long term (current) drug therapy: Secondary | ICD-10-CM | POA: Diagnosis not present

## 2018-02-23 DIAGNOSIS — Z955 Presence of coronary angioplasty implant and graft: Secondary | ICD-10-CM

## 2018-02-23 DIAGNOSIS — Z853 Personal history of malignant neoplasm of breast: Secondary | ICD-10-CM | POA: Diagnosis not present

## 2018-02-24 DIAGNOSIS — M25562 Pain in left knee: Secondary | ICD-10-CM | POA: Diagnosis not present

## 2018-02-24 DIAGNOSIS — M25561 Pain in right knee: Secondary | ICD-10-CM | POA: Diagnosis not present

## 2018-02-24 DIAGNOSIS — M1712 Unilateral primary osteoarthritis, left knee: Secondary | ICD-10-CM | POA: Diagnosis not present

## 2018-02-24 DIAGNOSIS — M1711 Unilateral primary osteoarthritis, right knee: Secondary | ICD-10-CM | POA: Diagnosis not present

## 2018-02-24 NOTE — Progress Notes (Signed)
Cardiac Individual Treatment Plan  Patient Details  Name: Tracey [name] MRN: [id number] Date of Birth: Dec 21, 1945 Referring Provider:     [place] from 01/06/2018 in [place]  Referring Provider  Sherren Mocha MD      Initial Encounter Date:    CARDIAC REHAB PHASE II ORIENTATION from 01/06/2018 in [place]  Date  01/06/18      Visit Diagnosis: NSTEMI (non-ST elevated myocardial infarction) [facility])  Status post coronary artery stent placement  Patient's Home Medications on Admission:  Current Outpatient Medications:  .  allopurinol (ZYLOPRIM) 100 MG tablet, TAKE 1 TABLET BY MOUTH ONCE DAILY, Disp: 90 tablet, Rfl: 0 .  ASPIRIN LOW DOSE 81 MG chewable tablet, CSW 1 T PO D, Disp: , Rfl: 0 .  celecoxib (CELEBREX) 200 MG capsule, TK ONE C PO QD, Disp: , Rfl: 3 .  cholecalciferol 5000 UNITS TABS, Take 0.2 tablets (1,000 Units total) by mouth daily., Disp: 100 tablet, Rfl: 3 .  clopidogrel (PLAVIX) 75 MG tablet, TK 1 T PO QD  TK 300 MG 4 T PO 1 TIME ON FIRST DAY THEN 75 MG QD, Disp: , Rfl: 0 .  colchicine 0.6 MG tablet, TAKE 1 TABLET BY MOUTH EVERY DAY OR AS NEEDED, Disp: 30 tablet, Rfl: 0 .  Cyanocobalamin (VITAMIN B-12 IJ), Inject 1,000 mcg as directed every 30 (thirty) days. Normally 1st of the month, Disp: , Rfl:  .  cyclobenzaprine (FLEXERIL) 10 MG tablet, TK 1 T PO Q 8 H PRF MUSCLE SPASMS, Disp: , Rfl: 0 .  exemestane (AROMASIN) 25 MG tablet, Take 1 tablet (25 mg total) by mouth daily after breakfast., Disp: 90 tablet, Rfl: 4 .  fluticasone (FLONASE) 50 MCG/ACT nasal spray, Place 1 spray into the nose daily as needed for rhinitis. , Disp: , Rfl:  .  furosemide (LASIX) 40 MG tablet, Take 40 mg by mouth daily as needed for fluid. , Disp: , Rfl:  .  lisinopril (PRINIVIL,ZESTRIL) 10 MG tablet, TK 1 T PO QD, Disp: , Rfl: 11 .  LORazepam (ATIVAN) 0.5 MG tablet, Take 0.5 mg by mouth at bedtime as  needed for anxiety (or sleep)., Disp: , Rfl:  .  menthol-cetylpyridinium (CEPACOL) 3 MG lozenge, Take 1 lozenge (3 mg total) by mouth as needed for sore throat., Disp: 100 tablet, Rfl: 12 .  metoprolol tartrate (LOPRESSOR) 25 MG tablet, Take 0.5 tablets (12.5 mg total) by mouth 2 (two) times daily., Disp: 90 tablet, Rfl: 3 .  pantoprazole (PROTONIX) 40 MG tablet, Take 1 tablet (40 mg total) by mouth daily., Disp: 90 tablet, Rfl: 3 .  rosuvastatin (CRESTOR) 20 MG tablet, Take 1 tablet (20 mg total) by mouth daily., Disp: 30 tablet, Rfl: 11 .  Triamcinolone Acetonide (TRIAMCINOLONE 0.1 % CREAM : EUCERIN) CREA, Apply 1 application topically 3 (three) times daily., Disp: 1 each, Rfl: 1 .  venlafaxine XR (EFFEXOR-XR) 150 MG 24 hr capsule, TAKE 1 CAPSULE BY MOUTH DAILY WITH BREAKFAST, Disp: 90 capsule, Rfl: 0 .  venlafaxine XR (EFFEXOR-XR) 37.5 MG 24 hr capsule, Take 1 capsule (37.5 mg total) by mouth daily., Disp: 90 capsule, Rfl: 0  Past Medical History: Past Medical History:  Diagnosis Date  . Allergy   . Anxiety    takes Ativan daily prn  . Arthritis   . Breast cancer ([place]) 2014   ER+/PR+/HEr2-,   . Bruises easily   . Colon polyps    2  polyps by report  . Diverticulosis   . GERD (gastroesophageal reflux disease)    occasionally takes Nexium   . Gout    takes Allopurinol daily and Colchicine daily prn;last attack 72yr ago  . Gout   . History of bladder infections    many yrs ago  . History of bronchitis    last time at least 72yrago  . History of stress incontinence   . Hyperlipidemia    takes Pravastatin daily  . Insomnia    takes Ambien nightly prn  . OSA (obstructive sleep apnea)    on CPAP  . Osteoarthritis   . Peripheral edema    takes Furosemide daily prn  . PONV (postoperative nausea and vomiting)   . Postmenopausal hormone therapy   . Radiation 07/27/13-09/07/13   Right Breast x 31 treatments  . Rhinitis    uses Flonase prn  . Sinus congestion   . Status post  breast reconstruction 10/15/14   Bilateral [place] and D[place]P [place]  . Tachycardia    takes Metoprolol daily  . [place]ust[place]   during sleep study 2009 with normal cardiac workup    Tobacco Use: Social History   Tobacco Use  Smoking Status Former Smoker  . Packs/day: 1.00  . Years: 20.00  . Pack years: 20.00  . Start date: 06/09/1992  Smokeless Tobacco Never Used  Tobacco Comment   quit at age 72  Labs: Recent Review Flowsheet Data    Labs for ITP Cardiac and Pulmonary Rehab Latest Ref Rng & Units 01/20/2018   Cholestrol 100 - 199 mg/dL 137   LDLCALC 0 - 99 mg/dL 64   HDL >39 mg/dL 59   Trlycerides 0 - 149 mg/dL 70      Capillary Blood Glucose: No results found for: GLUCAP   Exercise Target Goals:    Exercise Program Goal: Individual exercise prescription set using results from initial 6 min walk test and THRR while considering  patient's activity barriers and safety.   Exercise Prescription Goal: Initial exercise prescription builds to 30-45 minutes a day of aerobic activity, 2-3 days per week.  Home exercise guidelines will be given to patient during program as part of exercise prescription that the participant will acknowledge.  Activity Barriers & Risk Stratification: Activity Barriers & Cardiac Risk Stratification - 01/06/18 1511      Activity Barriers & Cardiac Risk Stratification   Activity Barriers  Back Problems;Balance Concerns;Other (comment)    Comments  Bilateral Knee Pain    Cardiac Risk Stratification  High       6 Minute Walk: 6 Minute Walk    Row Name 01/06/18 1507         6 Minute Walk   Phase  Initial     Distance  1238 feet     Walk Time  6 minutes     # of Rest Breaks  0     MPH  2.34     METS  2.4     RPE  11     Perceived Dyspnea   0     VO2 Peak  8.42     Symptoms  No     Resting HR  69 bpm     Resting BP  102/62     Resting Oxygen Saturation   100 %     Exercise  Oxygen Saturation  during 6 min walk  100 %     Max Ex.  HR  93 bpm     Max Ex. BP  120/60     2 Minute Post BP  102/70        Oxygen Initial Assessment:   Oxygen Re-Evaluation:   Oxygen Discharge (Final Oxygen Re-Evaluation):   Initial Exercise Prescription: Initial Exercise Prescription - 01/06/18 1500      Date of Initial Exercise RX and Referring Provider   Date  01/06/18    Referring Provider  Sherren Mocha MD      Recumbant Bike   Level  1.2    Watts  10    Minutes  10    METs  2.4      NuStep   Level  2    SPM  80    Minutes  10    METs  2.2      Arm Ergometer   Level  1    Watts  15    Minutes  10    METs  2.3      Prescription Details   Frequency (times per week)  3x    Duration  Progress to 30 minutes of continuous aerobic without signs/symptoms of physical distress      Intensity   THRR 40-80% of Max Heartrate  59-118    Ratings of Perceived Exertion  11-13    Perceived Dyspnea  0-4      Progression   Progression  Continue progressive overload as per policy without signs/symptoms or physical distress.      Resistance Training   Training Prescription  Yes    Weight  3lbs    Reps  10-15       Perform Capillary Blood Glucose checks as needed.  Exercise Prescription Changes: Exercise Prescription Changes    Row Name 01/12/18 1400 01/17/18 1423 01/21/18 1417 02/04/18 1712 02/11/18 1605     Response to Exercise   Blood Pressure (Admit)  116/64  108/70  108/60  138/62  138/82   Blood Pressure (Exercise)  128/68  124/70  122/80  122/74  138/72   Blood Pressure (Exit)  112/60  120/70  118/70  118/72  116/60   Heart Rate (Admit)  73 bpm  73 bpm  71 bpm  94 bpm  73 bpm   Heart Rate (Exercise)  77 bpm  101 bpm  96 bpm  123 bpm  109 bpm   Heart Rate (Exit)  57 bpm  72 bpm  64 bpm  98 bpm  73 bpm   Rating of Perceived Exertion (Exercise)  _0 Perceived Dyspnea (Exercise)  0  0  0  0  0   Symptoms  None  None   None  None  None     Comments  Pt oriented to exericse equipment   -  -  -  -   Duration  Progress to 30 minutes of  aerobic without signs/symptoms of physical distress  Progress to 30 minutes of  aerobic without signs/symptoms of physical distress  Continue with 30 min of aerobic exercise without signs/symptoms of physical distress.  Continue with 30 min of aerobic exercise without signs/symptoms of physical distress.  Continue with 30 min of aerobic exercise without signs/symptoms of physical distress.   Intensity  THRR unchanged  THRR unchanged  THRR unchanged  THRR unchanged  THRR unchanged     Progression   Progression  Continue to progress workloads to maintain intensity without signs/symptoms of physical distress.  Continue to progress workloads to maintain intensity without signs/symptoms of physical distress.  Continue to progress workloads to maintain intensity without signs/symptoms of physical distress.  Continue to progress workloads to maintain intensity without signs/symptoms of physical distress.  Continue to progress workloads to maintain intensity without signs/symptoms of physical distress.   Average METs  2.4  2.6  2.96  3.05  3.5     Resistance Training   Training Prescription  No  Yes  Yes  Yes  Yes   Weight  -  3lbs  3lbs  3lbs  3lbs   Reps  -  10-15  10-15  10-15  10-15   Time  -  10 Minutes  10 Minutes  10 Minutes  10 Minutes     Interval Training   Interval Training  No  No  No  No  No     Recumbant Bike   Level  -  1.2  1.2  1.23  2   Watts  -  _0 Minutes  -  _1 METs  -  2  2.97  2.97  2.97     NuStep   Level  _2 SPM  85  85  95  95  105   Minutes  _3 METs  2.4  2.8  2.9  3.2  2.8     Arm Ergometer   Level  _4 Watts  _5 52   Minutes  _6 METs  2.3  3  3.01  2.98  4.75     Home Exercise Plan   Plans to continue exercise at  -  -  -  [employer] (comment) [place] (comment) Sw[employer]ob[employer]   Frequency  -  -  -  Add 2 additional days to program exercise sessions.  Add 2 additional days to program exercise sessions.   Initial Home Exercises Provided  -  -  -  02/04/18  02/04/18   Row Name 02/23/18 1601             Response to Exercise   Blood Pressure (Admit)  124/78       Blood Pressure (Exercise)  124/82       Blood Pressure (Exit)  122/68       Heart Rate (Admit)  81 bpm       Heart Rate (Exercise)  98 bpm       Heart Rate (Exit)  69 bpm       Rating of Perceived Exertion (Exercise)  12       Perceived Dyspnea (Exercise)  0       Symptoms  None       Duration  Continue with 30 min of aerobic exercise without signs/symptoms of physical distress.       Intensity  THRR unchanged         Progression   Progression  Continue to progress workloads to maintain intensity without signs/symptoms of physical distress.       Average METs  2.84         Resistance Training   Training Prescription  No  Interval Training   Interval Training  No         NuStep   Level  5       SPM  105       Minutes  10       METs  2.7         Arm Ergometer   Level  3       Watts  35       Minutes  10       METs  2.96         Home Exercise Plan   Plans to continue exercise at  [employer] (comment) Swim/Water Aerobics       Frequency  Add 2 additional days to program exercise sessions.       Initial Home Exercises Provided  02/04/18          Exercise Comments: Exercise Comments    Row Name 01/12/18 1406 01/26/18 1422 02/04/18 1529       Exercise Comments  Today was pt first day of exercise. Pt oriented to exercise equipment. Pt is off to a good start with exercise.   Reviewed METs with pt. Pt will be on vacation for two weeks. Will review Home Exercise Program and goals with pt upon return. Pt plans to walk and golf on vacation.   Reviewed HEP with pt. Pt was very receptive to information. Pt plans to  exercise in local pool, swimming and water aerobics. Will continue to follow up with pt regarding home exericse program.         Exercise Goals and Review: Exercise Goals    Row Name 01/06/18 1511             Exercise Goals   Increase Physical Activity  Yes       Intervention  Provide advice, education, support and counseling about physical activity/exercise needs.;Develop an individualized exercise prescription for aerobic and resistive training based on initial evaluation findings, risk stratification, comorbidities and participant's personal goals.       Expected Outcomes  Short Term: Attend rehab on a regular basis to increase amount of physical activity.;Long Term: Add in home exercise to make exercise part of routine and to increase amount of physical activity.;Long Term: Exercising regularly at least 3-5 days a week.       Increase Strength and Stamina  Yes       Intervention  Provide advice, education, support and counseling about physical activity/exercise needs.;Develop an individualized exercise prescription for aerobic and resistive training based on initial evaluation findings, risk stratification, comorbidities and participant's personal goals.       Expected Outcomes  Short Term: Increase workloads from initial exercise prescription for resistance, speed, and METs.;Short Term: Perform resistance training exercises routinely during rehab and add in resistance training at home;Long Term: Improve cardiorespiratory fitness, muscular endurance and strength as measured by increased METs and functional capacity (6MWT)       Able to understand and use rate of perceived exertion (RPE) scale  Yes       Intervention  Provide education and explanation on how to use RPE scale       Expected Outcomes  Short Term: Able to use RPE daily in rehab to express subjective intensity level;Long Term:  Able to use RPE to guide intensity level when exercising independently       Knowledge and  understanding of Target Heart Rate Range (THRR)  Yes       Intervention  Provide education and explanation of THRR including how the numbers were predicted and where they are located for reference       Expected Outcomes  Short Term: Able to state/look up THRR;Short Term: Able to use daily as guideline for intensity in rehab;Long Term: Able to use THRR to govern intensity when exercising independently       Able to check pulse independently  Yes       Intervention  Provide education and demonstration on how to check pulse in carotid and radial arteries.;Review the importance of being able to check your own pulse for safety during independent exercise       Expected Outcomes  Long Term: Able to check pulse independently and accurately;Short Term: Able to explain why pulse checking is important during independent exercise       Understanding of Exercise Prescription  Yes       Intervention  Provide education, explanation, and written materials on patient's individual exercise prescription       Expected Outcomes  Short Term: Able to explain program exercise prescription;Long Term: Able to explain home exercise prescription to exercise independently          Exercise Goals Re-Evaluation : Exercise Goals Re-Evaluation    Row Name 02/04/18 1530             Exercise Goal Re-Evaluation   Exercise Goals Review  Increase Physical Activity;Able to understand and use rate of perceived exertion (RPE) scale;Knowledge and understanding of Target Heart Rate Range (THRR);Understanding of Exercise Prescription;Increase Strength and Stamina;Able to check pulse independently       Comments  Reviewed HEP with pt. Also reviewed THRR, RPE Scale, weather precautions, NTG use, endpoints of exercise, warmup and cool down.        Expected Outcomes  Pt will plan to exercise 2 additional days at home using a local gym's pool. Pt will continue to increase cardiorespiratory fitness. Will continue to monitor and progress pt  as tolerated.            Discharge Exercise Prescription (Final Exercise Prescription Changes): Exercise Prescription Changes - 02/23/18 1601      Response to Exercise   Blood Pressure (Admit)  124/78    Blood Pressure (Exercise)  124/82    Blood Pressure (Exit)  122/68    Heart Rate (Admit)  81 bpm    Heart Rate (Exercise)  98 bpm    Heart Rate (Exit)  69 bpm    Rating of Perceived Exertion (Exercise)  12    Perceived Dyspnea (Exercise)  0    Symptoms  None    Duration  Continue with 30 min of aerobic exercise without signs/symptoms of physical distress.    Intensity  THRR unchanged      Progression   Progression  Continue to progress workloads to maintain intensity without signs/symptoms of physical distress.    Average METs  2.84      Resistance Training   Training Prescription  No      Interval Training   Interval Training  No      NuStep   Level  5    SPM  105    Minutes  10    METs  2.7      Arm Ergometer   Level  3    Watts  35    Minutes  10    METs  2.96      Home Exercise Plan   Plans to continue exercise at  [employer] (comment) [employer]Water [employer]    Frequency  Add 2 additional days to program exercise sessions.    Initial Home Exercises Provided  02/04/18       Nutrition:  Target Goals: Understanding of nutrition guidelines, daily intake of sodium <1538m, cholesterol <2059m calories 30% from fat and 7% or less from saturated fats, daily to have 5 or more servings of fruits and vegetables.  Biometrics: Pre Biometrics - 01/06/18 1512      Pre Biometrics   Height  5' 1.5" (1.562 m)    Weight  144 lb 13.5 oz (65.7 kg)    Waist Circumference  38 inches    Hip Circumference  39.5 inches    Waist to Hip Ratio  0.96 %    BMI (Calculated)  26.93    Triceps Skinfold  27 mm    % Body Fat  40.3 %    Grip Strength  25 kg    Flexibility  25 in    Single Leg Stand  18.1 seconds        Nutrition Therapy Plan and Nutrition  Goals: Nutrition Therapy & Goals - 01/07/18 0800      Nutrition Therapy   Diet  Heart Healthy      Personal Nutrition Goals   Nutrition Goal  Pt to identify food quantities necessary to achieve weight loss of 8 lb at graduation from cardiac rehab.      Intervention Plan   Intervention  Prescribe, educate and counsel regarding individualized specific dietary modifications aiming towards targeted core components such as weight, hypertension, lipid management, diabetes, heart failure and other comorbidities.    Expected Outcomes  Short Term Goal: Understand basic principles of dietary content, such as calories, fat, sodium, cholesterol and nutrients.;Long Term Goal: Adherence to prescribed nutrition plan.       Nutrition Assessments: Nutrition Assessments - 01/07/18 0800      MEDFICTS Scores   Pre Score  18       Nutrition Goals Re-Evaluation: Nutrition Goals Re-Evaluation    [place] 01/31/18 1238             Goals   Current Weight  141 lb (64 kg) pt reported       Nutrition Goal  pt working towards weight loss goal, reports losing 3 lbs          Nutrition Goals Re-Evaluation: Nutrition Goals Re-Evaluation    [place] 01/31/18 1238             Goals   Current Weight  141 lb (64 kg) pt reported       Nutrition Goal  pt working towards weight loss goal, reports losing 3 lbs          Nutrition Goals Discharge (Final Nutrition Goals Re-Evaluation): Nutrition Goals Re-Evaluation - 01/31/18 1238      Goals   Current Weight  141 lb (64 kg) pt reported    Nutrition Goal  pt working towards weight loss goal, reports losing 3 lbs       Psychosocial: Target Goals: Acknowledge presence or absence of significant depression and/or stress, maximize coping skills, provide positive support system. Participant is able to verbalize types and ability to use techniques and skills needed for reducing stress and depression.  Initial Review & Psychosocial Screening: Initial  Psych Review & Screening - 01/06/18 1610      Initial Review   Current issues with  Current Anxiety/Panic;Current Stress Concerns    Comments  JaPiaxhibits  stress related to her illness.  She is fearful and anxious after her CV event.       Family Dynamics   Good Support System?  Yes Pt reports that her husband and daughters are sources of support for her.      Barriers   Psychosocial barriers to participate in program  The patient should benefit from training in stress management and relaxation.      Screening Interventions   Interventions  Encouraged to exercise    Expected Outcomes  Short Term goal: Utilizing psychosocial counselor, staff and physician to assist with identification of specific Stressors or current issues interfering with healing process. Setting desired goal for each stressor or current issue identified.;Long Term Goal: Stressors or current issues are controlled or eliminated.       Quality of Life Scores: Quality of Life - 01/06/18 1514      Quality of Life Scores   Health/Function Pre  20.57 %    Socioeconomic Pre  27 %    Psych/Spiritual Pre  25.5 %    Family Pre  26.38 %    GLOBAL Pre  23.78 %      Scores of 19 and below usually indicate a poorer quality of life in these areas.  A difference of  2-3 points is a clinically meaningful difference.  A difference of 2-3 points in the total score of the Quality of Life Index has been associated with significant improvement in overall quality of life, self-image, physical symptoms, and general health in studies assessing change in quality of life.  PHQ-9: Recent Review Flowsheet Data    Depression screen [facility] 2/9 01/12/2018 01/06/2017 03/30/2016 02/10/2015   Decreased Interest 0 0 0 0   Down, Depressed, Hopeless 0 0 0 0   PHQ - 2 Score 0 0 0 0     Interpretation of Total Score  Total Score Depression Severity:  1-4 = Minimal depression, 5-9 = Mild depression, 10-14 = Moderate depression, 15-19 = Moderately  severe depression, 20-27 = Severe depression   Psychosocial Evaluation and Intervention: Psychosocial Evaluation - 01/12/18 1705      Psychosocial Evaluation & Interventions   Interventions  Stress management education;Relaxation education;Encouraged to exercise with the program and follow exercise prescription    Comments  Haset has stress r/t her CV event.  She declines utilizing the spiritual care department at this time. Will continue to monitor.  Pt enjoys golfing, gardening, and spending time with her grandkids.    Expected Outcomes  Pt will exhibit a positive outlook utilizing good coping skills.     Continue Psychosocial Services   Follow up required by staff       Psychosocial Re-Evaluation: Psychosocial Re-Evaluation    [place] Name 01/27/18 1125 02/23/18 1639           Psychosocial Re-Evaluation   Current issues with  None Identified  None Identified      Comments  TRUE feels that she is managing her stress r/t to her CV event well.  No intervention necessary at this time.  Caramia continues to manage her stress well.  No intervention necessary at this time.      Expected Outcomes  Stephani will exhibit a positive outlook with good coping skills.  [place]over will exhibit a positive outlook with good coping skills.      Interventions  Encouraged to attend Cardiac Rehabilitation for the exercise  Encouraged to attend Cardiac Rehabilitation for the exercise      Continue Psychosocial Services  No Follow up required  No Follow up required         Psychosocial Discharge (Final Psychosocial Re-Evaluation): Psychosocial Re-Evaluation - 02/23/18 1639      Psychosocial Re-Evaluation   Current issues with  None Identified    Comments  Karmella continues to manage her stress well.  No intervention necessary at this time.    Expected Outcomes  Norvell will exhibit a positive outlook with good coping skills.    Interventions  Encouraged to attend Cardiac Rehabilitation for the exercise    Continue  Psychosocial Services   No Follow up required       Vocational Rehabilitation: Provide vocational rehab assistance to qualifying candidates.   Vocational Rehab Evaluation & Intervention: Vocational Rehab - 01/06/18 1610      Initial Vocational Rehab Evaluation & Intervention   Assessment shows need for Vocational Rehabilitation  No       Education: Education Goals: Education classes will be provided on a weekly basis, covering required topics. Participant will state understanding/return demonstration of topics presented.  Learning Barriers/Preferences: Learning Barriers/Preferences - 01/06/18 1514      Learning Barriers/Preferences   Learning Barriers  Sight    Learning Preferences  Skilled Demonstration;Pictoral;Verbal Instruction;Individual Instruction       Education Topics: Count Your Pulse:  -Group instruction provided by verbal instruction, demonstration, patient participation and written materials to support subject.  Instructors address importance of being able to find your pulse and how to count your pulse when at home without a heart monitor.  Patients get hands on experience counting their pulse with staff help and individually.   CARDIAC REHAB PHASE II EXERCISE from 02/23/2018 in [place]  Date  02/11/18  Educator  RN  Instruction Review Code  2- Demonstrated Understanding      Heart Attack, Angina, and Risk Factor Modification:  -Group instruction provided by verbal instruction, video, and written materials to support subject.  Instructors address signs and symptoms of angina and heart attacks.    Also discuss risk factors for heart disease and how to make changes to improve heart health risk factors.   CARDIAC REHAB PHASE II EXERCISE from 02/23/2018 in [place]  Date  02/02/18  Instruction Review Code  2- Demonstrated Understanding      Functional Fitness:  -Group instruction provided by  verbal instruction, demonstration, patient participation, and written materials to support subject.  Instructors address safety measures for doing things around the house.  Discuss how to get up and down off the floor, how to pick things up properly, how to safely get out of a chair without assistance, and balance training.   Meditation and Mindfulness:  -Group instruction provided by verbal instruction, patient participation, and written materials to support subject.  Instructor addresses importance of mindfulness and meditation practice to help reduce stress and improve awareness.  Instructor also leads participants through a meditation exercise.    Stretching for Flexibility and Mobility:  -Group instruction provided by verbal instruction, patient participation, and written materials to support subject.  Instructors lead participants through series of stretches that are designed to increase flexibility thus improving mobility.  These stretches are additional exercise for major muscle groups that are typically performed during regular warm up and cool down.   Hands Only CPR:  -Group verbal, video, and participation provides a basic overview of AHA guidelines for community CPR. Role-play of emergencies allow participants the opportunity to practice calling for help and chest  compression technique with discussion of AED use.   Hypertension: -Group verbal and written instruction that provides a basic overview of hypertension including the most recent diagnostic guidelines, risk factor reduction with self-care instructions and medication management.    Nutrition I class: Heart Healthy Eating:  -Group instruction provided by PowerPoint slides, verbal discussion, and written materials to support subject matter. The instructor gives an explanation and review of the Therapeutic Lifestyle Changes diet recommendations, which includes a discussion on lipid goals, dietary fat, sodium, fiber, plant  stanol/sterol esters, sugar, and the components of a well-balanced, healthy diet.   Nutrition II class: Lifestyle Skills:  -Group instruction provided by PowerPoint slides, verbal discussion, and written materials to support subject matter. The instructor gives an explanation and review of label reading, grocery shopping for heart health, heart healthy recipe modifications, and ways to make healthier choices when eating out.   Diabetes Question & Answer:  -Group instruction provided by PowerPoint slides, verbal discussion, and written materials to support subject matter. The instructor gives an explanation and review of diabetes co-morbidities, pre- and post-prandial blood glucose goals, pre-exercise blood glucose goals, signs, symptoms, and treatment of hypoglycemia and hyperglycemia, and foot care basics.   CARDIAC REHAB PHASE II EXERCISE from 02/23/2018 in [place]  Date  02/09/18  Educator  RD  Instruction Review Code  2- Demonstrated Understanding      Diabetes Blitz:  -Group instruction provided by PowerPoint slides, verbal discussion, and written materials to support subject matter. The instructor gives an explanation and review of the physiology behind type 1 and type 2 diabetes, diabetes medications and rational behind using different medications, pre- and post-prandial blood glucose recommendations and Hemoglobin A1c goals, diabetes diet, and exercise including blood glucose guidelines for exercising safely.    Portion Distortion:  -Group instruction provided by PowerPoint slides, verbal discussion, written materials, and food models to support subject matter. The instructor gives an explanation of serving size versus portion size, changes in portions sizes over the last 20 years, and what consists of a serving from each food group.   Stress Management:  -Group instruction provided by verbal instruction, video, and written materials to support  subject matter.  Instructors review role of stress in heart disease and how to cope with stress positively.     CARDIAC REHAB PHASE II EXERCISE from 02/23/2018 in [place]  Date  01/12/18  Instruction Review Code  2- Demonstrated Understanding      Exercising on Your Own:  -Group instruction provided by verbal instruction, power point, and written materials to support subject.  Instructors discuss benefits of exercise, components of exercise, frequency and intensity of exercise, and end points for exercise.  Also discuss use of nitroglycerin and activating EMS.  Review options of places to exercise outside of rehab.  Review guidelines for sex with heart disease.   CARDIAC REHAB PHASE II EXERCISE from 02/23/2018 in [place]  Date  02/04/18  Instruction Review Code  2- Demonstrated Understanding      Cardiac Drugs I:  -Group instruction provided by verbal instruction and written materials to support subject.  Instructor reviews cardiac drug classes: antiplatelets, anticoagulants, beta blockers, and statins.  Instructor discusses reasons, side effects, and lifestyle considerations for each drug class.   CARDIAC REHAB PHASE II EXERCISE from 02/23/2018 in [place]  Date  02/23/18  Instruction Review Code  2- Demonstrated Understanding  Cardiac Drugs II:  -Group instruction provided by verbal instruction and written materials to support subject.  Instructor reviews cardiac drug classes: angiotensin converting enzyme inhibitors (ACE-I), angiotensin II receptor blockers (ARBs), nitrates, and calcium channel blockers.  Instructor discusses reasons, side effects, and lifestyle considerations for each drug class.   Anatomy and Physiology of the Circulatory System:  Group verbal and written instruction and models provide basic cardiac anatomy and physiology, with the coronary electrical and arterial  systems. Review of: AMI, Angina, Valve disease, Heart Failure, Peripheral Artery Disease, Cardiac Arrhythmia, Pacemakers, and the ICD.   CARDIAC REHAB PHASE II EXERCISE from 02/23/2018 in [place]  Date  01/19/18  Instruction Review Code  2- Demonstrated Understanding      Other Education:  -Group or individual verbal, written, or video instructions that support the educational goals of the cardiac rehab program.   Holiday Eating Survival Tips:  -Group instruction provided by PowerPoint slides, verbal discussion, and written materials to support subject matter. The instructor gives patients tips, tricks, and techniques to help them not only survive but enjoy the holidays despite the onslaught of food that accompanies the holidays.   Knowledge Questionnaire Score: Knowledge Questionnaire Score - 01/06/18 1514      Knowledge Questionnaire Score   Pre Score  22/24       Core Components/Risk Factors/Patient Goals at Admission: Personal Goals and Risk Factors at Admission - 01/06/18 1506      Core Components/Risk Factors/Patient Goals on Admission    Weight Management  Yes;Weight Loss    Intervention  Weight Management: Develop a combined nutrition and exercise program designed to reach desired caloric intake, while maintaining appropriate intake of nutrient and fiber, sodium and fats, and appropriate energy expenditure required for the weight goal.;Weight Management: Provide education and appropriate resources to help participant work on and attain dietary goals.    Admit Weight  144 lb 13.5 oz (65.7 kg)    Goal Weight: Short Term  139 lb (63 kg)    Goal Weight: Long Term  134 lb (60.8 kg)    Expected Outcomes  Long Term: Adherence to nutrition and physical activity/exercise program aimed toward attainment of established weight goal;Short Term: Continue to assess and modify interventions until short term weight is achieved;Weight Loss: Understanding of  general recommendations for a balanced deficit meal plan, which promotes 1-2 lb weight loss per week and includes a negative energy balance of [phone] kcal/d;Understanding recommendations for meals to include 15-35% energy as protein, 25-35% energy from fat, 35-60% energy from carbohydrates, less than 225m of dietary cholesterol, 20-35 gm of total fiber daily;Understanding of distribution of calorie intake throughout the day with the consumption of 4-5 meals/snacks    Hypertension  Yes    Intervention  Provide education on lifestyle modifcations including regular physical activity/exercise, weight management, moderate sodium restriction and increased consumption of fresh fruit, vegetables, and low fat dairy, alcohol moderation, and smoking cessation.;Monitor prescription use compliance.    Expected Outcomes  Short Term: Continued assessment and intervention until BP is < 140/976mHG in hypertensive participants. < 130/8038mG in hypertensive participants with diabetes, heart failure or chronic kidney disease.;Long Term: Maintenance of blood pressure at goal levels.    Lipids  Yes    Intervention  Provide education and support for participant on nutrition & aerobic/resistive exercise along with prescribed medications to achieve LDL <49m54mDL >40mg34m Expected Outcomes  Short Term: Participant states understanding of desired cholesterol values and  is compliant with medications prescribed. Participant is following exercise prescription and nutrition guidelines.;Long Term: Cholesterol controlled with medications as prescribed, with individualized exercise RX and with personalized nutrition plan. Value goals: LDL < 9m, HDL > 40 mg.    Stress  Yes    Intervention  Offer individual and/or small group education and counseling on adjustment to heart disease, stress management and health-related lifestyle change. Teach and support self-help strategies.;Refer participants experiencing significant psychosocial  distress to appropriate mental health specialists for further evaluation and treatment. When possible, include family members and significant others in education/counseling sessions.    Expected Outcomes  Short Term: Participant demonstrates changes in health-related behavior, relaxation and other stress management skills, ability to obtain effective social support, and compliance with psychotropic medications if prescribed.;Long Term: Emotional wellbeing is indicated by absence of clinically significant psychosocial distress or social isolation.       Core Components/Risk Factors/Patient Goals Review:  Goals and Risk Factor Review    Row Name 01/12/18 1704 01/27/18 1124 02/23/18 1638         Core Components/Risk Factors/Patient Goals Review   Personal Goals Review  Weight Management/Obesity;Lipids;Hypertension;Stress  Weight Management/Obesity;Lipids;Hypertension;Stress  Weight Management/Obesity;Lipids;Hypertension;Stress     Review  Pt with multiple CAD RF willing to resume CR exercise.  Pt is hoping to be able to get back to golfing 18 holes.  Pt with multiple CAD RF willing to resume CR exercise.  Pt is hoping to be able to get back to golfing 18 holes and will be walking and golfing during her upcoming vacation.  Pt with multiple CAD RF willing to participate in CR exercise.  JCaralinawas able to golf 18 holes during vacation.  She feels that she has increased her strength.     Expected Outcomes  Pt with multiple CAD RF will participate in CR exercise, nutrtition, and lifestyle modification opportunities.   Pt with multiple CAD RF will participate in CR exercise, nutrtition, and lifestyle modification opportunities.   Pt with multiple CAD RF will participate in CR exercise, nutrtition, and lifestyle modification opportunities.         Core Components/Risk Factors/Patient Goals at Discharge (Final Review):  Goals and Risk Factor Review - 02/23/18 1638      Core Components/Risk Factors/Patient  Goals Review   Personal Goals Review  Weight Management/Obesity;Lipids;Hypertension;Stress    Review  Pt with multiple CAD RF willing to participate in CR exercise.  JArdellewas able to golf 18 holes during vacation.  She feels that she has increased her strength.    Expected Outcomes  Pt with multiple CAD RF will participate in CR exercise, nutrtition, and lifestyle modification opportunities.        ITP Comments: ITP Comments    Row Name 01/06/18 1454 01/27/18 1124 02/23/18 1635       ITP Comments  Dr. TFransico Him Medical Director  30 Day ITP Review.  JZonieis tolerating exercise well.  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  JAlmettawill be on vacation and plans to exercise.  30 Day ITP Review.  JKianiis tolerating exercise well after vacation.  She feels that she is increasing her stamina.         Comments: See ITP Comments

## 2018-02-25 ENCOUNTER — Other Ambulatory Visit: Payer: Self-pay

## 2018-02-25 ENCOUNTER — Encounter (HOSPITAL_COMMUNITY)
Admission: RE | Admit: 2018-02-25 | Discharge: 2018-02-25 | Disposition: A | Discharge status: Home / Self Care | Payer: Medicare Other | Source: Ambulatory Visit | Attending: Cardiovascular Disease | Admitting: Cardiovascular Disease

## 2018-02-25 ENCOUNTER — Encounter (HOSPITAL_COMMUNITY): Payer: Medicare Other

## 2018-02-25 DIAGNOSIS — Z1501 Genetic susceptibility to malignant neoplasm of breast: Secondary | ICD-10-CM

## 2018-02-25 DIAGNOSIS — Z79899 Other long term (current) drug therapy: Secondary | ICD-10-CM | POA: Diagnosis not present

## 2018-02-25 DIAGNOSIS — Z955 Presence of coronary angioplasty implant and graft: Secondary | ICD-10-CM | POA: Diagnosis not present

## 2018-02-25 DIAGNOSIS — G4733 Obstructive sleep apnea (adult) (pediatric): Secondary | ICD-10-CM | POA: Diagnosis not present

## 2018-02-25 DIAGNOSIS — I214 Non-ST elevation (NSTEMI) myocardial infarction: Secondary | ICD-10-CM

## 2018-02-25 DIAGNOSIS — Z853 Personal history of malignant neoplasm of breast: Secondary | ICD-10-CM | POA: Diagnosis not present

## 2018-02-25 DIAGNOSIS — Z7982 Long term (current) use of aspirin: Secondary | ICD-10-CM | POA: Diagnosis not present

## 2018-02-28 ENCOUNTER — Telehealth: Payer: Self-pay | Admitting: Cardiovascular Disease

## 2018-02-28 ENCOUNTER — Other Ambulatory Visit: Payer: Self-pay

## 2018-02-28 ENCOUNTER — Encounter (HOSPITAL_COMMUNITY)
Admission: RE | Admit: 2018-02-28 | Discharge: 2018-02-28 | Disposition: A | Discharge status: Home / Self Care | Payer: Medicare Other | Source: Ambulatory Visit | Attending: Cardiovascular Disease | Admitting: Cardiovascular Disease

## 2018-02-28 ENCOUNTER — Encounter (HOSPITAL_COMMUNITY): Payer: Medicare Other

## 2018-02-28 DIAGNOSIS — I214 Non-ST elevation (NSTEMI) myocardial infarction: Secondary | ICD-10-CM

## 2018-02-28 DIAGNOSIS — Z955 Presence of coronary angioplasty implant and graft: Secondary | ICD-10-CM

## 2018-02-28 DIAGNOSIS — G4733 Obstructive sleep apnea (adult) (pediatric): Secondary | ICD-10-CM | POA: Diagnosis not present

## 2018-02-28 DIAGNOSIS — Z7982 Long term (current) use of aspirin: Secondary | ICD-10-CM | POA: Diagnosis not present

## 2018-02-28 DIAGNOSIS — I2510 Atherosclerotic heart disease of native coronary artery without angina pectoris: Secondary | ICD-10-CM

## 2018-02-28 DIAGNOSIS — Z853 Personal history of malignant neoplasm of breast: Secondary | ICD-10-CM | POA: Diagnosis not present

## 2018-02-28 DIAGNOSIS — Z79899 Other long term (current) drug therapy: Secondary | ICD-10-CM | POA: Diagnosis not present

## 2018-02-28 MED ORDER — EZETIMIBE 10 MG PO TABS: 10.0000 mg | ORAL_TABLET | Freq: Every day | ORAL | 3 refills | Status: DC

## 2018-02-28 NOTE — Telephone Encounter (Signed)
Left message to call back  

## 2018-02-28 NOTE — Telephone Encounter (Signed)
Informed patient of results and verbal understanding expressed.  Instructed patient to START ZETIA 10 mg daily. Repeat fasting labs scheduled 04/29/18, prior to follow-up OV 9/16. She was grateful for call and agrees with treatment plan.

## 2018-02-28 NOTE — Telephone Encounter (Signed)
-----   Message from Sherren Mocha, MD sent at 02/20/2018  7:50 AM EDT ----- Labs reviewed. LFT's better, but LDL cholesterol up significantly from previous. Would add Zetia 10 mg and continue crestor at current dose. FU labs 8 weeks. thx

## 2018-02-28 NOTE — Telephone Encounter (Signed)
Follow Up:      Returning your call from today. 

## 2018-03-02 ENCOUNTER — Other Ambulatory Visit: Payer: Self-pay

## 2018-03-02 ENCOUNTER — Encounter (HOSPITAL_COMMUNITY)
Admission: RE | Admit: 2018-03-02 | Discharge: 2018-03-02 | Disposition: A | Discharge status: Home / Self Care | Payer: Medicare Other | Source: Ambulatory Visit | Attending: Cardiovascular Disease | Admitting: Cardiovascular Disease

## 2018-03-02 ENCOUNTER — Encounter (HOSPITAL_COMMUNITY): Payer: Medicare Other

## 2018-03-02 DIAGNOSIS — Z79899 Other long term (current) drug therapy: Secondary | ICD-10-CM | POA: Diagnosis not present

## 2018-03-02 DIAGNOSIS — I214 Non-ST elevation (NSTEMI) myocardial infarction: Secondary | ICD-10-CM

## 2018-03-02 DIAGNOSIS — Z955 Presence of coronary angioplasty implant and graft: Secondary | ICD-10-CM

## 2018-03-02 DIAGNOSIS — Z7982 Long term (current) use of aspirin: Secondary | ICD-10-CM | POA: Diagnosis not present

## 2018-03-02 DIAGNOSIS — G4733 Obstructive sleep apnea (adult) (pediatric): Secondary | ICD-10-CM | POA: Diagnosis not present

## 2018-03-02 DIAGNOSIS — Z853 Personal history of malignant neoplasm of breast: Secondary | ICD-10-CM | POA: Diagnosis not present

## 2018-03-04 ENCOUNTER — Telehealth (HOSPITAL_COMMUNITY): Payer: Self-pay | Admitting: Internal Medicine

## 2018-03-04 ENCOUNTER — Encounter (HOSPITAL_COMMUNITY): Payer: Medicare Other

## 2018-03-06 NOTE — Progress Notes (Signed)
ID: Tracey [name] OB: 04-Jul-1946  MR#: [id number]  TDD#:[id number]  PCP: [name], MD/ [name] GYN:   SU: [name] OTHER MD: [name], William Hamburger [phone])  Patient Care Team: [name], MD as PCP - General (Internal Medicine) Sherren Mocha, MD as Consulting Physician (Cardiology) Margarette Canada (Inactive) Daylee Delahoz, Virgie Dad, MD as Consulting Physician (Oncology)   CHIEF COMPLAINT:right breast cancer  CURRENT TREATMENT: exemestane  HISTORY OF PRESENT ILLNESS: From the original intakenote:  [name] had routine screening mammography July of 2014 showing a suspicious mass in her right breast. Additional views confirmed a mass in the upper outer quadrant of the right breast, which by ultrasound measured 4 mm. Biopsy of this mass was obtained in [place], at [facility]. 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  The patient's subsequent history is as detailed below  INTERVAL HISTORY: Tracey [name] returns today for follow-up and treatment of her estrogen receptor positive breast cancer. She continues on exemestane, with good tolerance. She has hot flashes.. She denies issues with vaginal dryness.  She is an expensive medicine and she paid $198 for a 29monthsupply.    REVIEW OF SYSTEMS: Tracey [name] that she in cardiac rehabilitation due to a MI in January 2019.  She notes that [place] She had 2 stents placed, but one collapsed and she had another heart attack in the course of the procedure. She had chest pain and was afraid that she was dying. She tries not to remember the details. She is taking blood thinners, and she has frequent bruising. She has a large bruise on her right arm. She denies unusual headaches, visual changes, nausea, vomiting, or dizziness. There has been  no unusual cough, phlegm production, or pleurisy. This been no change in bowel or bladder habits. She denies unexplained fatigue or unexplained weight loss or fever. A detailed review of systems was otherwise stable.    PAST MEDICAL HISTORY: Past Medical History:  Diagnosis Date  . Allergy   . Anxiety    takes Ativan daily prn  . Arthritis   . Breast cancer ([facility] 2014   ER+/PR+/HEr2-,   . Bruises easily   . Colon polyps    2 polyps by report  . Diverticulosis   . GERD (gastroesophageal reflux disease)    occasionally takes Nexium   . Gout    takes Allopurinol daily and Colchicine daily prn;last attack [id number]  . Gout   . History of bladder infections    many yrs ago  . History of bronchitis    last time at least [id number]  . History of stress incontinence   . Hyperlipidemia    takes Pravastatin daily  . Insomnia    takes Ambien nightly prn  . OSA (obstructive sleep apnea)    on CPAP  . Osteoarthritis   . Peripheral edema    takes Furosemide daily prn  . PONV (postoperative nausea and vomiting)   . Postmenopausal hormone therapy   . Radiation 07/27/13-09/07/13   Right Breast x 31 treatments  . Rhinitis    uses Flonase prn  . Sinus congestion   . Status post breast reconstruction 10/15/14   [place]  . Tachycardia    takes Metoprolol daily  . Ventricular tachycardia, non-sustained (HCC)    during sleep study  2009 with normal cardiac workup    PAST SURGICAL HISTORY: Past Surgical History:  Procedure Laterality Date  . ABDOMINAL HYSTERECTOMY    . APPENDECTOMY    . AXILLARY SENTINEL NODE BIOPSY Right 05/23/2013   Procedure: AXILLARY SENTINEL NODE BIOPSY;  Surgeon: [name], MD;  Location: [facility];  Service: General;  Laterality: Right;  nuc med injection 7:00  . bilateral cataract surgery    . BREAST RECONSTRUCTION Bilateral 05/17/2014   W  . BREAST RECONSTRUCTION WITH PLACEMENT OF TISSUE EXPANDER AND FLEX HD  (ACELLULAR HYDRATED DERMIS) Bilateral 05/23/2013   Procedure: BILATERAL BREAST RECONSTRUCTION WITH PLACEMENT OF TISSUE EXPANDER AND FLEX HD;  Surgeon: [name], MD;  Location: [place] [facility];  Service: Plastics;  Laterality: Bilateral;  . CARDIAC CATHETERIZATION  1999  . CHOLECYSTECTOMY    . COLONOSCOPY    . COSMETIC SURGERY    . GALLBLADDER SURGERY    . REMOVAL OF BILATERAL TISSUE EXPANDERS WITH PLACEMENT OF BILATERAL BREAST IMPLANTS Bilateral 05/17/2014   Procedure: REMOVAL OF BILATERAL TISSUE EXPANDERS WITH PLACEMENT OF BILATERAL BREAST IMPLANTS FOR RECONSTRUCTION;  Surgeon: [name], MD;  Location: [facility];  Service: Plastics;  Laterality: Bilateral;  . TONSILLECTOMY    . TOTAL MASTECTOMY Bilateral 05/23/2013   WITH RECONSTRUCTION  . TOTAL MASTECTOMY Bilateral 05/23/2013   Procedure: TOTAL MASTECTOMY;  Surgeon: [name], MD;  Location: [place];  Service: General;  Laterality: Bilateral;  . urinary tightening     d/t urinary incontinence    FAMILY HISTORY Family History  Problem Relation Age of Onset  . Breast cancer Mother        possible inflammatory breast cancer  . Heart attack Father   . Heart disease Father   . ALS Sister   . Breast cancer Maternal Grandmother 33  . Heart attack Maternal Grandfather   . Heart attack Paternal Grandfather   . Breast cancer Maternal Aunt        dx in her 67s  . Breast cancer Other        maternal great grandmother; dx in her 18s   the patient's father died from heart disease at the age of 28. The patient's mother died from breast cancer at the age of 73. It is not clear when she was diagnosed since she "dictated and". The patient had no brothers. One sister died from [place] at the age of 47 in addition, one of the patient's mother is sisters was diagnosed with breast cancer in her 69s. The patient's mother is mother, was also diagnosed with breast cancer, at the age of 34. The patient's daughter, [name], has been tested for the BRCA genes and was  negative.  GYNECOLOGIC HISTORY:  Menarche age 4, first live birth age 59, the patient is [place] P2. She underwent total abdominal hysterectomy and bilateral salpingo-oophorectomy at the age of 60 for endometriosis. She took hormone replacement until August of 2014.  SOCIAL HISTORY:  Brandis moved to [place] About 4 years ago and is planning to make this area her "home-base", although they also have a home in [place] where they go for the winter to ski. 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 They are now enjoying their retirement. Daughter [name] lives in [place]. A younger daughter [name] is a homemaker in [place]. The patient has 3 grandchildren. She is a Furniture conservator/restorer.    ADVANCED DIRECTIVES: In place   HEALTH MAINTENANCE:  Social History   Tobacco Use  . Smoking status: Former Smoker    Packs/day: 1.00    Years: 20.00    Pack years: 20.00    Start date: 06/09/1992  . Smokeless tobacco: Never Used  . Tobacco comment: quit at age 34  Substance Use Topics  . Alcohol use: Yes    Alcohol/week: 3.0 oz    Types: 5 Glasses of wine per week    Comment: nightly  . Drug use: No    Comment: quit 24 years ago     Colonoscopy: 2010  PAP: August 2014  Bone density: 2010, normal per patient  Lipid panel:  Allergies  Allergen Reactions  . Ivp Dye [Iodinated Diagnostic Agents] Hives  . Sulfa Antibiotics Swelling    Current Outpatient Medications  Medication Sig Dispense Refill  . allopurinol (ZYLOPRIM) 100 MG tablet TAKE 1 TABLET BY MOUTH ONCE DAILY 90 tablet 0  . ASPIRIN LOW DOSE 81 MG chewable tablet CSW 1 T PO D  0  . celecoxib (CELEBREX) 200 MG capsule TK ONE C PO QD  3  . cholecalciferol 5000 UNITS TABS Take 0.2 tablets (1,000 Units total) by mouth daily. 100 tablet 3  . clopidogrel (PLAVIX) 75 MG tablet TK 1 T PO QD  TK 300 MG 4 T PO 1 TIME ON FIRST DAY THEN 75 MG QD  0  .  colchicine 0.6 MG tablet TAKE 1 TABLET BY MOUTH EVERY DAY OR AS NEEDED 30 tablet 0  . Cyanocobalamin (VITAMIN B-12 IJ) Inject 1,000 mcg as directed every 30 (thirty) days. Normally 1st of the month    . cyclobenzaprine (FLEXERIL) 10 MG tablet TK 1 T PO Q 8 H PRF MUSCLE SPASMS  0  . exemestane (AROMASIN) 25 MG tablet Take 1 tablet (25 mg total) by mouth daily after breakfast. 90 tablet 4  . ezetimibe (ZETIA) 10 MG tablet Take 1 tablet (10 mg total) by mouth daily. 90 tablet 3  . fluticasone (FLONASE) 50 MCG/ACT nasal spray Place 1 spray into the nose daily as needed for rhinitis.     . furosemide (LASIX) 40 MG tablet Take 40 mg by mouth daily as needed for fluid.     Marland Kitchen lisinopril (PRINIVIL,ZESTRIL) 10 MG tablet TK 1 T PO QD  11  . LORazepam (ATIVAN) 0.5 MG tablet Take 0.5 mg by mouth at bedtime as needed for anxiety (or sleep).    . menthol-cetylpyridinium (CEPACOL) 3 MG lozenge Take 1 lozenge (3 mg total) by mouth as needed for sore throat. 100 tablet 12  . metoprolol tartrate (LOPRESSOR) 25 MG tablet Take 0.5 tablets (12.5 mg total) by mouth 2 (two) times daily. 90 tablet 3  . pantoprazole (PROTONIX) 40 MG tablet Take 1 tablet (40 mg total) by mouth daily. 90 tablet 3  . rosuvastatin (CRESTOR) 20 MG tablet Take 1 tablet (20 mg total) by mouth daily. 30 tablet 11  . Triamcinolone Acetonide (TRIAMCINOLONE 0.1 % CREAM : EUCERIN) CREA Apply 1 application topically 3 (three) times daily. 1 each 1  . venlafaxine XR (EFFEXOR-XR) 150 MG 24 hr capsule TAKE 1 CAPSULE BY MOUTH DAILY WITH BREAKFAST 90 capsule 0  . venlafaxine XR (EFFEXOR-XR) 37.5 MG 24 hr capsule Take 1 capsule (37.5 mg total) by mouth daily. 90 capsule 0   No current facility-administered medications for this visit.     OBJECTIVE: Middle-aged white woman in no acute distress  Vitals:   03/07/18 1300  BP: 132/69  Pulse: 67  Resp: 17  Temp: 98.3 F (36.8 C)  SpO2: 100%     Body mass index is 26.4 kg/m.    ECOG FS:1 - Symptomatic  but completely ambulatory Filed Weights   03/07/18 1300  Weight: 142 lb (64.4 kg)    Sclerae unicteric, pupils round and equal No cervical or supraclavicular adenopathy Lungs no rales or rhonchi Heart regular rate and rhythm Abd soft, nontender, positive bowel sounds MSK no focal spinal tenderness, no upper extremity lymphedema Neuro: nonfocal, well oriented, appropriate affect Breasts: Status post bilateral mastectomies.  She has undergone bilateral D IEP reconstruction.  There is no evidence of chest wall recurrence.  Both axillae are benign.   LAB RESULTS:   Lab Results  Component Value Date   WBC 5.8 12/06/2017   NEUTROABS 3.8 12/06/2017   HGB 12.7 12/06/2017   HCT 39.1 12/06/2017   MCV 93.9 12/06/2017   PLT 155 12/06/2017      Chemistry      Component Value Date/Time   NA 143 01/20/2018 0952   NA 141 01/06/2017 1316   K 4.5 01/20/2018 0952   K 4.4 01/06/2017 1316   CL 106 01/20/2018 0952   CO2 19 (L) 01/20/2018 0952   CO2 23 01/06/2017 1316   BUN 18 01/20/2018 0952   BUN 23.3 01/06/2017 1316   CREATININE 1.07 (H) 01/20/2018 0952   CREATININE 1.29 (H) 12/06/2017 1016   CREATININE 0.9 01/06/2017 1316      Component Value Date/Time   CALCIUM 9.5 01/20/2018 0952   CALCIUM 10.0 01/06/2017 1316   ALKPHOS 59 01/20/2018 0952   ALKPHOS 85 01/06/2017 1316   AST 63 (H) 01/20/2018 0952   AST 29 12/06/2017 1016   AST 27 01/06/2017 1316   ALT 86 (H) 01/20/2018 0952   ALT 45 12/06/2017 1016   ALT 40 01/06/2017 1316   BILITOT 0.3 01/20/2018 0952   BILITOT 0.4 12/06/2017 1016   BILITOT 0.47 01/06/2017 1316       STUDIES: She had a chest CT on 09/15/2017 for evaluation on of shortness of breath and left arm pain in [place] showing no evidence of PE, but diffuse wall thickening throughout the esophagus.  She also had hepatic steatosis  ASSESSMENT: 72 y.o. BRCA 1 & 2 negative [place] woman   (1)  status post right breast upper outer quadrant biopsy 03/24/2013  for a pT1b cN0, stage IA invasive ductal carcinoma, grade 1, strongly estrogen and progesterone receptor positive, HER-2 negative  (2) status post bilateral mastectomies 05/23/2013 showing:  (a) on the right, a pT1b pN0, stage invasive ductal carcinoma, grade 1, again HER-2 negative  (b) on the left, no evidence of malignancy  (3) Oncotype DX score of 20 predicted a rate of distant recurrence within 10 years of 13% if the patient's only systemic treatment was tamoxifen for 5 years.  (4) a positive margin required adjuvant radiation, completed January 2015 in [place]  (5) status post bilateral implant placement January 2015  (a) with impending right implant rupture February 2016 underwent bilateral DIEP reconstruction under Dr. Kandyce Rud in [place] (fax [phone])  (6)  started on anastrozole, mid September 2014, prior to definitive surgery; continued postop; bone density 07/31/2013 was normal  (a) repeat bone density 02/03/2016 showed a T score of -0.1 (normal  (b) switched to tamoxifen November 2018 because of arthralgias secondary to the anastrozole  (c) switched to exemestane as of 12/13/2017  (d) completing five years of anti-estrogens September 2019  PLAN: Tracey [name] is now just about 5 years  out from definitive surgery for her breast cancer with no evidence of disease recurrence.  This is very favorable.  She is tolerating exemestane generally well.  On the other hand she is having significant hot flashes from this medication.  We discussed the data showing that 7 years of an aromatase inhibitor are no worse than 10.  We also discussed the data comparing 5 and 10 years of an aromatase inhibitor.  The benefit in terms of distant recurrence is in the 1% range.  We reviewed also her Oncotype study.  This tells [place] that by taking an antiestrogen for 5 years her risk of recurrence over 10 years would be 13%.  This means now that 5 years of past her risk is in the 6% range.  She could  bring it down to 5% by taking 2 more years of exemestane.  This is not a motivator for her and I am comfortable with her going off exemestane once she completes the 5 years this September.  I offered her "graduation" but she derives some reassurance from being examined here on oh yearly basis and I am will be glad to see her again next August  She knows to call for any other issues that may develop before the next visit.   , Virgie Dad, MD  03/07/18 1:25 PM Medical Oncology and Hematology [facility] [address] [place],  [address] Tel. [phone]    Fax. [phone]  Alice Rieger, am acting as scribe for Chauncey Cruel MD.  I, Lurline Del MD, have reviewed the above documentation for accuracy and completeness, and I agree with the above.

## 2018-03-07 ENCOUNTER — Telehealth: Payer: Self-pay | Admitting: Oncology

## 2018-03-07 ENCOUNTER — Encounter (HOSPITAL_COMMUNITY)
Admission: RE | Admit: 2018-03-07 | Discharge: 2018-03-07 | Disposition: A | Discharge status: Home / Self Care | Payer: Medicare Other | Source: Ambulatory Visit | Attending: Cardiovascular Disease | Admitting: Cardiovascular Disease

## 2018-03-07 ENCOUNTER — Inpatient Hospital Stay: Payer: Medicare Other | Attending: Oncology | Admitting: Oncology

## 2018-03-07 ENCOUNTER — Encounter (HOSPITAL_COMMUNITY): Payer: Medicare Other

## 2018-03-07 VITALS — BP 132/69 | HR 67 | Temp 98.3°F | Resp 17 | Ht 61.5 in | Wt 142.0 lb

## 2018-03-07 DIAGNOSIS — Z87891 Personal history of nicotine dependence: Secondary | ICD-10-CM | POA: Diagnosis not present

## 2018-03-07 DIAGNOSIS — Z170 Estrogen receptor positive status [ER+]: Secondary | ICD-10-CM | POA: Diagnosis not present

## 2018-03-07 DIAGNOSIS — I214 Non-ST elevation (NSTEMI) myocardial infarction: Secondary | ICD-10-CM

## 2018-03-07 DIAGNOSIS — Z803 Family history of malignant neoplasm of breast: Secondary | ICD-10-CM | POA: Insufficient documentation

## 2018-03-07 DIAGNOSIS — Z79899 Other long term (current) drug therapy: Secondary | ICD-10-CM | POA: Insufficient documentation

## 2018-03-07 DIAGNOSIS — Z7982 Long term (current) use of aspirin: Secondary | ICD-10-CM | POA: Diagnosis not present

## 2018-03-07 DIAGNOSIS — Z853 Personal history of malignant neoplasm of breast: Secondary | ICD-10-CM | POA: Diagnosis not present

## 2018-03-07 DIAGNOSIS — C50411 Malignant neoplasm of upper-outer quadrant of right female breast: Secondary | ICD-10-CM

## 2018-03-07 DIAGNOSIS — Z79811 Long term (current) use of aromatase inhibitors: Secondary | ICD-10-CM | POA: Insufficient documentation

## 2018-03-07 DIAGNOSIS — N951 Menopausal and female climacteric states: Secondary | ICD-10-CM

## 2018-03-07 DIAGNOSIS — Z923 Personal history of irradiation: Secondary | ICD-10-CM | POA: Diagnosis not present

## 2018-03-07 DIAGNOSIS — G4733 Obstructive sleep apnea (adult) (pediatric): Secondary | ICD-10-CM | POA: Diagnosis not present

## 2018-03-07 DIAGNOSIS — Z955 Presence of coronary angioplasty implant and graft: Secondary | ICD-10-CM

## 2018-03-07 NOTE — Telephone Encounter (Signed)
Gave avs and calendar ° °

## 2018-03-08 DIAGNOSIS — Z1212 Encounter for screening for malignant neoplasm of rectum: Secondary | ICD-10-CM | POA: Diagnosis not present

## 2018-03-09 ENCOUNTER — Encounter (HOSPITAL_COMMUNITY): Payer: Medicare Other

## 2018-03-09 ENCOUNTER — Encounter (HOSPITAL_COMMUNITY)
Admission: RE | Admit: 2018-03-09 | Discharge: 2018-03-09 | Disposition: A | Discharge status: Home / Self Care | Payer: Medicare Other | Source: Ambulatory Visit | Attending: Cardiovascular Disease | Admitting: Cardiovascular Disease

## 2018-03-09 VITALS — Ht 61.5 in | Wt 141.5 lb

## 2018-03-09 DIAGNOSIS — G4733 Obstructive sleep apnea (adult) (pediatric): Secondary | ICD-10-CM | POA: Diagnosis not present

## 2018-03-09 DIAGNOSIS — Z955 Presence of coronary angioplasty implant and graft: Secondary | ICD-10-CM

## 2018-03-09 DIAGNOSIS — Z853 Personal history of malignant neoplasm of breast: Secondary | ICD-10-CM | POA: Diagnosis not present

## 2018-03-09 DIAGNOSIS — I214 Non-ST elevation (NSTEMI) myocardial infarction: Secondary | ICD-10-CM

## 2018-03-09 DIAGNOSIS — Z7982 Long term (current) use of aspirin: Secondary | ICD-10-CM | POA: Diagnosis not present

## 2018-03-09 DIAGNOSIS — Z79899 Other long term (current) drug therapy: Secondary | ICD-10-CM | POA: Diagnosis not present

## 2018-03-11 ENCOUNTER — Encounter (HOSPITAL_COMMUNITY): Payer: Medicare Other

## 2018-03-11 ENCOUNTER — Telehealth (HOSPITAL_COMMUNITY): Payer: Self-pay | Admitting: Internal Medicine

## 2018-03-14 ENCOUNTER — Encounter (HOSPITAL_COMMUNITY): Payer: Medicare Other

## 2018-03-14 ENCOUNTER — Encounter (HOSPITAL_COMMUNITY)
Admission: RE | Admit: 2018-03-14 | Discharge: 2018-03-14 | Disposition: A | Discharge status: Home / Self Care | Payer: Medicare Other | Source: Ambulatory Visit | Attending: Cardiovascular Disease | Admitting: Cardiovascular Disease

## 2018-03-14 DIAGNOSIS — Z955 Presence of coronary angioplasty implant and graft: Secondary | ICD-10-CM

## 2018-03-14 DIAGNOSIS — Z7982 Long term (current) use of aspirin: Secondary | ICD-10-CM | POA: Diagnosis not present

## 2018-03-14 DIAGNOSIS — I214 Non-ST elevation (NSTEMI) myocardial infarction: Secondary | ICD-10-CM

## 2018-03-14 DIAGNOSIS — G4733 Obstructive sleep apnea (adult) (pediatric): Secondary | ICD-10-CM | POA: Diagnosis not present

## 2018-03-14 DIAGNOSIS — Z79899 Other long term (current) drug therapy: Secondary | ICD-10-CM | POA: Diagnosis not present

## 2018-03-14 DIAGNOSIS — Z853 Personal history of malignant neoplasm of breast: Secondary | ICD-10-CM | POA: Diagnosis not present

## 2018-03-16 ENCOUNTER — Encounter (HOSPITAL_COMMUNITY): Payer: Medicare Other

## 2018-03-16 ENCOUNTER — Encounter (HOSPITAL_COMMUNITY)
Admission: RE | Admit: 2018-03-16 | Discharge: 2018-03-16 | Disposition: A | Discharge status: Home / Self Care | Payer: Medicare Other | Source: Ambulatory Visit | Attending: Cardiovascular Disease | Admitting: Cardiovascular Disease

## 2018-03-16 DIAGNOSIS — Z955 Presence of coronary angioplasty implant and graft: Secondary | ICD-10-CM

## 2018-03-16 DIAGNOSIS — Z853 Personal history of malignant neoplasm of breast: Secondary | ICD-10-CM | POA: Diagnosis not present

## 2018-03-16 DIAGNOSIS — Z7982 Long term (current) use of aspirin: Secondary | ICD-10-CM | POA: Diagnosis not present

## 2018-03-16 DIAGNOSIS — G4733 Obstructive sleep apnea (adult) (pediatric): Secondary | ICD-10-CM | POA: Diagnosis not present

## 2018-03-16 DIAGNOSIS — I214 Non-ST elevation (NSTEMI) myocardial infarction: Secondary | ICD-10-CM | POA: Diagnosis not present

## 2018-03-16 DIAGNOSIS — Z79899 Other long term (current) drug therapy: Secondary | ICD-10-CM | POA: Diagnosis not present

## 2018-03-18 ENCOUNTER — Encounter (HOSPITAL_COMMUNITY)
Admission: RE | Admit: 2018-03-18 | Discharge: 2018-03-18 | Disposition: A | Discharge status: Home / Self Care | Payer: Medicare Other | Source: Ambulatory Visit | Attending: Cardiovascular Disease | Admitting: Cardiovascular Disease

## 2018-03-18 ENCOUNTER — Encounter (HOSPITAL_COMMUNITY): Payer: Medicare Other

## 2018-03-18 DIAGNOSIS — Z7982 Long term (current) use of aspirin: Secondary | ICD-10-CM | POA: Diagnosis not present

## 2018-03-18 DIAGNOSIS — Z79899 Other long term (current) drug therapy: Secondary | ICD-10-CM | POA: Insufficient documentation

## 2018-03-18 DIAGNOSIS — Z955 Presence of coronary angioplasty implant and graft: Secondary | ICD-10-CM | POA: Diagnosis not present

## 2018-03-18 DIAGNOSIS — G4733 Obstructive sleep apnea (adult) (pediatric): Secondary | ICD-10-CM | POA: Diagnosis not present

## 2018-03-18 DIAGNOSIS — Z87891 Personal history of nicotine dependence: Secondary | ICD-10-CM | POA: Insufficient documentation

## 2018-03-18 DIAGNOSIS — I214 Non-ST elevation (NSTEMI) myocardial infarction: Secondary | ICD-10-CM | POA: Insufficient documentation

## 2018-03-18 DIAGNOSIS — E785 Hyperlipidemia, unspecified: Secondary | ICD-10-CM | POA: Insufficient documentation

## 2018-03-18 DIAGNOSIS — Z853 Personal history of malignant neoplasm of breast: Secondary | ICD-10-CM | POA: Insufficient documentation

## 2018-03-21 ENCOUNTER — Encounter (HOSPITAL_COMMUNITY)
Admission: RE | Admit: 2018-03-21 | Discharge: 2018-03-21 | Disposition: A | Discharge status: Home / Self Care | Payer: Medicare Other | Source: Ambulatory Visit | Attending: Cardiovascular Disease | Admitting: Cardiovascular Disease

## 2018-03-21 ENCOUNTER — Encounter (HOSPITAL_COMMUNITY): Payer: Medicare Other

## 2018-03-21 DIAGNOSIS — Z79899 Other long term (current) drug therapy: Secondary | ICD-10-CM | POA: Diagnosis not present

## 2018-03-21 DIAGNOSIS — Z955 Presence of coronary angioplasty implant and graft: Secondary | ICD-10-CM

## 2018-03-21 DIAGNOSIS — Z853 Personal history of malignant neoplasm of breast: Secondary | ICD-10-CM | POA: Diagnosis not present

## 2018-03-21 DIAGNOSIS — G4733 Obstructive sleep apnea (adult) (pediatric): Secondary | ICD-10-CM | POA: Diagnosis not present

## 2018-03-21 DIAGNOSIS — Z7982 Long term (current) use of aspirin: Secondary | ICD-10-CM | POA: Diagnosis not present

## 2018-03-21 DIAGNOSIS — I214 Non-ST elevation (NSTEMI) myocardial infarction: Secondary | ICD-10-CM

## 2018-03-23 ENCOUNTER — Encounter (HOSPITAL_COMMUNITY)
Admission: RE | Admit: 2018-03-23 | Discharge: 2018-03-23 | Disposition: A | Discharge status: Home / Self Care | Payer: Medicare Other | Source: Ambulatory Visit | Attending: Cardiovascular Disease | Admitting: Cardiovascular Disease

## 2018-03-23 ENCOUNTER — Encounter (HOSPITAL_COMMUNITY): Payer: Medicare Other

## 2018-03-23 DIAGNOSIS — I214 Non-ST elevation (NSTEMI) myocardial infarction: Secondary | ICD-10-CM

## 2018-03-23 DIAGNOSIS — Z79899 Other long term (current) drug therapy: Secondary | ICD-10-CM | POA: Diagnosis not present

## 2018-03-23 DIAGNOSIS — Z7982 Long term (current) use of aspirin: Secondary | ICD-10-CM | POA: Diagnosis not present

## 2018-03-23 DIAGNOSIS — G4733 Obstructive sleep apnea (adult) (pediatric): Secondary | ICD-10-CM | POA: Diagnosis not present

## 2018-03-23 DIAGNOSIS — Z955 Presence of coronary angioplasty implant and graft: Secondary | ICD-10-CM

## 2018-03-23 DIAGNOSIS — Z853 Personal history of malignant neoplasm of breast: Secondary | ICD-10-CM | POA: Diagnosis not present

## 2018-03-24 ENCOUNTER — Encounter (HOSPITAL_COMMUNITY): Payer: Self-pay

## 2018-03-24 NOTE — Progress Notes (Signed)
Cardiac Individual Treatment Plan  Patient Details  Name: Tracey [name] MRN: [id number] Date of Birth: 1945/11/21 Referring Provider:     CARDIAC REHAB PHASE II ORIENTATION from 01/06/2018 in [place]  Referring Provider  Sherren Mocha MD      Initial Encounter Date:    CARDIAC REHAB PHASE II ORIENTATION from 01/06/2018 in [place]  Date  01/06/18      Visit Diagnosis: Status post coronary artery stent placement  NSTEMI (non-ST elevated myocardial infarction) ([place])  Patient's Home Medications on Admission:  Current Outpatient Medications:  .  allopurinol (ZYLOPRIM) 100 MG tablet, TAKE 1 TABLET BY MOUTH ONCE DAILY, Disp: 90 tablet, Rfl: 0 .  ASPIRIN LOW DOSE 81 MG chewable tablet, CSW 1 T PO D, Disp: , Rfl: 0 .  celecoxib (CELEBREX) 200 MG capsule, TK ONE C PO QD, Disp: , Rfl: 3 .  cholecalciferol 5000 UNITS TABS, Take 0.2 tablets (1,000 Units total) by mouth daily., Disp: 100 tablet, Rfl: 3 .  clopidogrel (PLAVIX) 75 MG tablet, TK 1 T PO QD  TK 300 MG 4 T PO 1 TIME ON FIRST DAY THEN 75 MG QD, Disp: , Rfl: 0 .  colchicine 0.6 MG tablet, TAKE 1 TABLET BY MOUTH EVERY DAY OR AS NEEDED, Disp: 30 tablet, Rfl: 0 .  Cyanocobalamin (VITAMIN B-12 IJ), Inject 1,000 mcg as directed every 30 (thirty) days. Normally 1st of the month, Disp: , Rfl:  .  cyclobenzaprine (FLEXERIL) 10 MG tablet, TK 1 T PO Q 8 H PRF MUSCLE SPASMS, Disp: , Rfl: 0 .  exemestane (AROMASIN) 25 MG tablet, Take 1 tablet (25 mg total) by mouth daily after breakfast., Disp: 90 tablet, Rfl: 4 .  ezetimibe (ZETIA) 10 MG tablet, Take 1 tablet (10 mg total) by mouth daily., Disp: 90 tablet, Rfl: 3 .  fluticasone (FLONASE) 50 MCG/ACT nasal spray, Place 1 spray into the nose daily as needed for rhinitis. , Disp: , Rfl:  .  furosemide (LASIX) 40 MG tablet, Take 40 mg by mouth daily as needed for fluid. , Disp: , Rfl:  .  lisinopril (PRINIVIL,ZESTRIL) 10 MG tablet,  TK 1 T PO QD, Disp: , Rfl: 11 .  LORazepam (ATIVAN) 0.5 MG tablet, Take 0.5 mg by mouth at bedtime as needed for anxiety (or sleep)., Disp: , Rfl:  .  menthol-cetylpyridinium (CEPACOL) 3 MG lozenge, Take 1 lozenge (3 mg total) by mouth as needed for sore throat., Disp: 100 tablet, Rfl: 12 .  metoprolol tartrate (LOPRESSOR) 25 MG tablet, Take 0.5 tablets (12.5 mg total) by mouth 2 (two) times daily., Disp: 90 tablet, Rfl: 3 .  pantoprazole (PROTONIX) 40 MG tablet, Take 1 tablet (40 mg total) by mouth daily., Disp: 90 tablet, Rfl: 3 .  rosuvastatin (CRESTOR) 20 MG tablet, Take 1 tablet (20 mg total) by mouth daily., Disp: 30 tablet, Rfl: 11 .  Triamcinolone Acetonide (TRIAMCINOLONE 0.1 % CREAM : EUCERIN) CREA, Apply 1 application topically 3 (three) times daily., Disp: 1 each, Rfl: 1 .  venlafaxine XR (EFFEXOR-XR) 150 MG 24 hr capsule, TAKE 1 CAPSULE BY MOUTH DAILY WITH BREAKFAST, Disp: 90 capsule, Rfl: 0 .  venlafaxine XR (EFFEXOR-XR) 37.5 MG 24 hr capsule, Take 1 capsule (37.5 mg total) by mouth daily., Disp: 90 capsule, Rfl: 0  Past Medical History: Past Medical History:  Diagnosis Date  . Allergy   . Anxiety    takes Ativan daily prn  . Arthritis   .  Breast cancer ([place]) 2014   ER+/PR+/HEr2-,   . Bruises easily   . Colon polyps    2 polyps by report  . Diverticulosis   . GERD (gastroesophageal reflux disease)    occasionally takes Nexium   . Gout    takes Allopurinol daily and Colchicine daily prn;last attack 30yr ago  . Gout   . History of bladder infections    many yrs ago  . History of bronchitis    last time at least 851yrago  . History of stress incontinence   . Hyperlipidemia    takes Pravastatin daily  . Insomnia    takes Ambien nightly prn  . OSA (obstructive sleep apnea)    on CPAP  . Osteoarthritis   . Peripheral edema    takes Furosemide daily prn  . PONV (postoperative nausea and vomiting)   . Postmenopausal hormone therapy   . Radiation 07/27/13-09/07/13    Right Breast x 31 treatments  . Rhinitis    uses Flonase prn  . Sinus congestion   . Status post breast reconstruction 10/15/14   Bilateral implant removal and DIEP performed in [facility]  . Tachycardia    takes Metoprolol daily  . Ventricular tachycardia, non-sustained ([facility]   during sleep study 2009 with normal cardiac workup    Tobacco Use: Social History   Tobacco Use  Smoking Status Former Smoker  . Packs/day: 1.00  . Years: 20.00  . Pack years: 20.00  . Start date: 06/09/1992  Smokeless Tobacco Never Used  Tobacco Comment   quit at age 72  Labs: Recent Review Flowsheet Data    Labs for ITP Cardiac and Pulmonary Rehab Latest Ref Rng & Units 01/20/2018   Cholestrol 100 - 199 mg/dL 137   LDLCALC 0 - 99 mg/dL 64   HDL >39 mg/dL 59   Trlycerides 0 - 149 mg/dL 70      Capillary Blood Glucose: No results found for: GLUCAP   Exercise Target Goals:    Exercise Program Goal: Individual exercise prescription set using results from initial 6 min walk test and THRR while considering  patient's activity barriers and safety.   Exercise Prescription Goal: Initial exercise prescription builds to 30-45 minutes a day of aerobic activity, 2-3 days per week.  Home exercise guidelines will be given to patient during program as part of exercise prescription that the participant will acknowledge.  Activity Barriers & Risk Stratification: Activity Barriers & Cardiac Risk Stratification - 01/06/18 1511      Activity Barriers & Cardiac Risk Stratification   Activity Barriers  Back Problems;Balance Concerns;Other (comment)    Comments  Bilateral Knee Pain    Cardiac Risk Stratification  High       6 Minute Walk: 6 Minute Walk    Row Name 01/06/18 1507 03/09/18 1510       6 Minute Walk   Phase  Initial  Discharge    Distance  1238 feet  1283 feet    Distance % Change  -  3.63 %    Distance Feet Change  -  45 ft    Walk Time  6 minutes  6 minutes    # of Rest Breaks  0   0    MPH  2.34  2.43    METS  2.4  2.5    RPE  11  11    Perceived Dyspnea   0  0    VO2 Peak  8.42  8.74  Symptoms  No  No    Resting HR  69 bpm  75 bpm    Resting BP  102/62  108/66    Resting Oxygen Saturation   100 %  -    Exercise Oxygen Saturation  during 6 min walk  100 %  -    Max Ex. HR  93 bpm  96 bpm    Max Ex. BP  120/60  114/60    2 Minute Post BP  102/70  110/70       Oxygen Initial Assessment:   Oxygen Re-Evaluation:   Oxygen Discharge (Final Oxygen Re-Evaluation):   Initial Exercise Prescription: Initial Exercise Prescription - 01/06/18 1500      Date of Initial Exercise RX and Referring Provider   Date  01/06/18    Referring Provider  Sherren Mocha MD      Recumbant Bike   Level  1.2    Watts  10    Minutes  10    METs  2.4      NuStep   Level  2    SPM  80    Minutes  10    METs  2.2      Arm Ergometer   Level  1    Watts  15    Minutes  10    METs  2.3      Prescription Details   Frequency (times per week)  3x    Duration  Progress to 30 minutes of continuous aerobic without signs/symptoms of physical distress      Intensity   THRR 40-80% of Max Heartrate  59-118    Ratings of Perceived Exertion  11-13    Perceived Dyspnea  0-4      Progression   Progression  Continue progressive overload as per policy without signs/symptoms or physical distress.      Resistance Training   Training Prescription  Yes    Weight  3lbs    Reps  10-15       Perform Capillary Blood Glucose checks as needed.  Exercise Prescription Changes: Exercise Prescription Changes    Row Name 01/12/18 1400 01/17/18 1423 01/21/18 1417 02/04/18 1712 02/11/18 1605     Response to Exercise   Blood Pressure (Admit)  116/64  108/70  108/60  138/62  138/82   Blood Pressure (Exercise)  128/68  124/70  122/80  122/74  138/72   Blood Pressure (Exit)  112/60  120/70  118/70  118/72  116/60   Heart Rate (Admit)  73 bpm  73 bpm  71 bpm  94 bpm  73 bpm   Heart  Rate (Exercise)  77 bpm  101 bpm  96 bpm  123 bpm  109 bpm   Heart Rate (Exit)  57 bpm  72 bpm  64 bpm  98 bpm  73 bpm   Rating of Perceived Exertion (Exercise)  12  12  12  12  12    Perceived Dyspnea (Exercise)  0  0  0  0  0   Symptoms  None  None   None  None  None    Comments  Pt oriented to exericse equipment   -  -  -  -   Duration  Progress to 30 minutes of  aerobic without signs/symptoms of physical distress  Progress to 30 minutes of  aerobic without signs/symptoms of physical distress  Continue with 30 min of aerobic exercise without signs/symptoms of physical distress.  Continue with  30 min of aerobic exercise without signs/symptoms of physical distress.  Continue with 30 min of aerobic exercise without signs/symptoms of physical distress.   Intensity  THRR unchanged  THRR unchanged  THRR unchanged  THRR unchanged  THRR unchanged     Progression   Progression  Continue to progress workloads to maintain intensity without signs/symptoms of physical distress.  Continue to progress workloads to maintain intensity without signs/symptoms of physical distress.  Continue to progress workloads to maintain intensity without signs/symptoms of physical distress.  Continue to progress workloads to maintain intensity without signs/symptoms of physical distress.  Continue to progress workloads to maintain intensity without signs/symptoms of physical distress.   Average METs  2.4  2.6  2.96  3.05  3.5     Resistance Training   Training Prescription  No  Yes  Yes  Yes  Yes   Weight  -  3lbs  3lbs  3lbs  3lbs   Reps  -  10-15  10-15  10-15  10-15   Time  -  10 Minutes  10 Minutes  10 Minutes  10 Minutes     Interval Training   Interval Training  No  No  No  No  No     Recumbant Bike   Level  -  1.2  1.2  1.23  2   Watts  -  10  22  22  25    Minutes  -  10  10  10  10    METs  -  2  2.97  2.97  2.97     NuStep   Level  4  4  4  4  4    SPM  85  85  95  95  105   Minutes  15  10  10  10  10     METs  2.4  2.8  2.9  3.2  2.8     Arm Ergometer   Level  2  1  1  1  2    Watts  10  10  15  25   52   Minutes  15  10  10  10  10    METs  2.3  3  3.01  2.98  4.75     Home Exercise Plan   Plans to continue exercise at  -  -  -  [employer] [facility] [employer] (comment) [place] (comment) Swim/Water Aerobics   Frequency  -  -  -  Add 2 additional days to program exercise sessions.  Add 2 additional days to program exercise sessions.   Initial Home Exercises Provided  -  -  -  02/04/18  02/04/18   Row Name 02/23/18 1601 03/09/18 1502 03/21/18 1549         Response to Exercise   Blood Pressure (Admit)  124/78  108/66  132/78     Blood Pressure (Exercise)  124/82  114/60  132/76     Blood Pressure (Exit)  122/68  110/70  102/68     Heart Rate (Admit)  81 bpm  75 bpm  95 bpm     Heart Rate (Exercise)  98 bpm  96 bpm  114 bpm     Heart Rate (Exit)  69 bpm  75 bpm  89 bpm     Rating of Perceived Exertion (Exercise)  12  11  13      Perceived Dyspnea (Exercise)  0  0  0     Symptoms  None  Pt has reported bilateral knee pain. Recumbent Bike removed from prescription  None      Duration  Continue with 30 min of aerobic exercise without signs/symptoms of physical distress.  Progress to 45 minutes of aerobic exercise without signs/symptoms of physical distress  Continue with 45 min of aerobic exercise without signs/symptoms of physical distress.     Intensity  THRR unchanged  -  THRR unchanged       Progression   Progression  Continue to progress workloads to maintain intensity without signs/symptoms of physical distress.  Continue to progress workloads to maintain intensity without signs/symptoms of physical distress.  Continue to progress workloads to maintain intensity without signs/symptoms of physical distress.     Average METs  2.84  2.6  3.2       Resistance Training   Training Prescription  No  No  Yes     Weight  -  -  3lbs     Reps  -  -  10-15     Time  -  -  10  Minutes       Interval Training   Interval Training  No  No  No       Recumbant Bike   Level  -  -  2     Minutes  -  -  10     METs  -  -  2       NuStep   Level  5  5  5      SPM  105  105  110     Minutes  10  20  10      METs  2.7  2.7  3.4       Arm Ergometer   Level  3  3  3      Watts  35  35  35     Minutes  10  10  10      METs  2.96  2.96  2.9       Home Exercise Plan   Plans to continue exercise at  [employer] (comment) [facility] (comment) [place] (comment) Swim/ Water Aerobics     Frequency  Add 2 additional days to program exercise sessions.  Add 2 additional days to program exercise sessions.  Add 2 additional days to program exercise sessions.     Initial Home Exercises Provided  02/04/18  02/04/18  02/04/18        Exercise Comments: Exercise Comments    Row Name 01/12/18 1406 01/26/18 1422 02/04/18 1529 03/24/18 1554     Exercise Comments  Today was pt first day of exercise. Pt oriented to exercise equipment. Pt is off to a good start with exercise.   Reviewed METs with pt. Pt will be on vacation for two weeks. Will review Home Exercise Program and goals with pt upon return. Pt plans to walk and golf on vacation.   Reviewed HEP with pt. Pt was very receptive to information. Pt plans to exercise in local pool, swimming and water aerobics. Will continue to follow up with pt regarding home exericse program.   Pt will be graduating rehab within the next week. Pt is responding well to workload increases despite chronic knee pain. Will continue to work with pt regarding home exercise plan.        Exercise Goals and Review: Exercise Goals    Row Name 01/06/18 1511  Exercise Goals   Increase Physical Activity  Yes       Intervention  Provide advice, education, support and counseling about physical activity/exercise needs.;Develop an individualized exercise prescription for aerobic and  resistive training based on initial evaluation findings, risk stratification, comorbidities and participant's personal goals.       Expected Outcomes  Short Term: Attend rehab on a regular basis to increase amount of physical activity.;Long Term: Add in home exercise to make exercise part of routine and to increase amount of physical activity.;Long Term: Exercising regularly at least 3-5 days a week.       Increase Strength and Stamina  Yes       Intervention  Provide advice, education, support and counseling about physical activity/exercise needs.;Develop an individualized exercise prescription for aerobic and resistive training based on initial evaluation findings, risk stratification, comorbidities and participant's personal goals.       Expected Outcomes  Short Term: Increase workloads from initial exercise prescription for resistance, speed, and METs.;Short Term: Perform resistance training exercises routinely during rehab and add in resistance training at home;Long Term: Improve cardiorespiratory fitness, muscular endurance and strength as measured by increased METs and functional capacity (6MWT)       Able to understand and use rate of perceived exertion (RPE) scale  Yes       Intervention  Provide education and explanation on how to use RPE scale       Expected Outcomes  Short Term: Able to use RPE daily in rehab to express subjective intensity level;Long Term:  Able to use RPE to guide intensity level when exercising independently       Knowledge and understanding of Target Heart Rate Range (THRR)  Yes       Intervention  Provide education and explanation of THRR including how the numbers were predicted and where they are located for reference       Expected Outcomes  Short Term: Able to state/look up THRR;Short Term: Able to use daily as guideline for intensity in rehab;Long Term: Able to use THRR to govern intensity when exercising independently       Able to check pulse independently  Yes        Intervention  Provide education and demonstration on how to check pulse in carotid and radial arteries.;Review the importance of being able to check your own pulse for safety during independent exercise       Expected Outcomes  Long Term: Able to check pulse independently and accurately;Short Term: Able to explain why pulse checking is important during independent exercise       Understanding of Exercise Prescription  Yes       Intervention  Provide education, explanation, and written materials on patient's individual exercise prescription       Expected Outcomes  Short Term: Able to explain program exercise prescription;Long Term: Able to explain home exercise prescription to exercise independently          Exercise Goals Re-Evaluation : Exercise Goals Re-Evaluation    Row Name 02/04/18 1530 03/24/18 1551           Exercise Goal Re-Evaluation   Exercise Goals Review  Increase Physical Activity;Able to understand and use rate of perceived exertion (RPE) scale;Knowledge and understanding of Target Heart Rate Range (THRR);Understanding of Exercise Prescription;Increase Strength and Stamina;Able to check pulse independently  Understanding of Exercise Prescription;Increase Physical Activity      Comments  Reviewed HEP with pt. Also reviewed THRR, RPE Scale, weather precautions, NTG use,  endpoints of exercise, warmup and cool down.   Pt is responding well to exericse prescription despite limitations with knees. Pt has expressed interest in maintainence program upon graduating rehab. Pt will be graduating in a week.       Expected Outcomes  Pt will plan to exercise 2 additional days at home using a local gym's pool. Pt will continue to increase cardiorespiratory fitness. Will continue to monitor and progress pt as tolerated.   Pt will continue to exercise 2 days a week in addition to cardiac rehab exercise. Pt is learning to work within her limits. Will continue to monitor.           Discharge  Exercise Prescription (Final Exercise Prescription Changes): Exercise Prescription Changes - 03/21/18 1549      Response to Exercise   Blood Pressure (Admit)  132/78    Blood Pressure (Exercise)  132/76    Blood Pressure (Exit)  102/68    Heart Rate (Admit)  95 bpm    Heart Rate (Exercise)  114 bpm    Heart Rate (Exit)  89 bpm    Rating of Perceived Exertion (Exercise)  13    Perceived Dyspnea (Exercise)  0    Symptoms  None     Duration  Continue with 45 min of aerobic exercise without signs/symptoms of physical distress.    Intensity  THRR unchanged      Progression   Progression  Continue to progress workloads to maintain intensity without signs/symptoms of physical distress.    Average METs  3.2      Resistance Training   Training Prescription  Yes    Weight  3lbs    Reps  10-15    Time  10 Minutes      Interval Training   Interval Training  No      Recumbant Bike   Level  2    Minutes  10    METs  2      NuStep   Level  5    SPM  110    Minutes  10    METs  3.4      Arm Ergometer   Level  3    Watts  35    Minutes  10    METs  2.9      Home Exercise Plan   Plans to continue exercise at  [employer] [facility] [employer] (comment)   Swim/ Water Aerobics   Frequency  Add 2 additional days to program exercise sessions.    Initial Home Exercises Provided  02/04/18       Nutrition:  Target Goals: Understanding of nutrition guidelines, daily intake of sodium <1552m, cholesterol <2058m calories 30% from fat and 7% or less from saturated fats, daily to have 5 or more servings of fruits and vegetables.  Biometrics: Pre Biometrics - 01/06/18 1512      Pre Biometrics   Height  5' 1.5" (1.562 m)    Weight  65.7 kg    Waist Circumference  38 inches    Hip Circumference  39.5 inches    Waist to Hip Ratio  0.96 %    BMI (Calculated)  26.93    Triceps Skinfold  27 mm    % Body Fat  40.3 %    Grip Strength  25 kg    Flexibility  25 in    Single Leg Stand  18.1 seconds       Post Biometrics - 03/09/18 1511  Post  Biometrics   Height  5' 1.5" (1.562 m)    Weight  64.2 kg    Waist Circumference  37.5 inches    Hip Circumference  38 inches    Waist to Hip Ratio  0.99 %    BMI (Calculated)  26.31    Triceps Skinfold  25 mm    % Body Fat  39.4 %    Grip Strength  28 kg    Flexibility  14 in    Single Leg Stand  30 seconds       Nutrition Therapy Plan and Nutrition Goals: Nutrition Therapy & Goals - 01/07/18 0800      Nutrition Therapy   Diet  Heart Healthy      Personal Nutrition Goals   Nutrition Goal  Pt to identify food quantities necessary to achieve weight loss of 8 lb at graduation from cardiac rehab.      Intervention Plan   Intervention  Prescribe, educate and counsel regarding individualized specific dietary modifications aiming towards targeted core components such as weight, hypertension, lipid management, diabetes, heart failure and other comorbidities.    Expected Outcomes  Short Term Goal: Understand basic principles of dietary content, such as calories, fat, sodium, cholesterol and nutrients.;Long Term Goal: Adherence to prescribed nutrition plan.       Nutrition Assessments: Nutrition Assessments - 01/07/18 0800      MEDFICTS Scores   Pre Score  18       Nutrition Goals Re-Evaluation: Nutrition Goals Re-Evaluation    Tracey Dailey Name 01/31/18 1238             Goals   Current Weight  141 lb (64 kg) pt reported       Nutrition Goal  pt working towards weight loss goal, reports losing 3 lbs          Nutrition Goals Re-Evaluation: Nutrition Goals Re-Evaluation    [name] Name 01/31/18 1238             Goals   Current Weight  141 lb (64 kg) pt reported       Nutrition Goal  pt working towards weight loss goal, reports losing 3 lbs          Nutrition Goals Discharge (Final Nutrition Goals Re-Evaluation): Nutrition Goals Re-Evaluation - 01/31/18 1238      Goals   Current Weight  141 lb (64 kg)   pt reported    Nutrition Goal  pt working towards weight loss goal, reports losing 3 lbs       Psychosocial: Target Goals: Acknowledge presence or absence of significant depression and/or stress, maximize coping skills, provide positive support system. Participant is able to verbalize types and ability to use techniques and skills needed for reducing stress and depression.  Initial Review & Psychosocial Screening: Initial Psych Review & Screening - 01/06/18 1610      Initial Review   Current issues with  Current Anxiety/Panic;Current Stress Concerns    Comments  Tracey [name] exhibits stress related to her illness.  She is fearful and anxious after her CV event.       Family Dynamics   Good Support System?  Yes   Pt reports that her husband and daughters are sources of support for her.     Barriers   Psychosocial barriers to participate in program  The patient should benefit from training in stress management and relaxation.      Screening Interventions   Interventions  Encouraged to exercise  Expected Outcomes  Short Term goal: Utilizing psychosocial counselor, staff and physician to assist with identification of specific Stressors or current issues interfering with healing process. Setting desired goal for each stressor or current issue identified.;Long Term Goal: Stressors or current issues are controlled or eliminated.       Quality of Life Scores: Quality of Life - 01/06/18 1514      Quality of Life Scores   Health/Function Pre  20.57 %    Socioeconomic Pre  27 %    Psych/Spiritual Pre  25.5 %    Family Pre  26.38 %    GLOBAL Pre  23.78 %      Scores of 19 and below usually indicate a poorer quality of life in these areas.  A difference of  2-3 points is a clinically meaningful difference.  A difference of 2-3 points in the total score of the Quality of Life Index has been associated with significant improvement in overall quality of life, self-image, physical symptoms, and general health in  studies assessing change in quality of life.  PHQ-9: Recent Review Flowsheet Data    Depression screen [facility] 2/9 01/12/2018 01/06/2017 03/30/2016 02/10/2015   Decreased Interest 0 0 0 0   Down, Depressed, Hopeless 0 0 0 0   PHQ - 2 Score 0 0 0 0     Interpretation of Total Score  Total Score Depression Severity:  1-4 = Minimal depression, 5-9 = Mild depression, 10-14 = Moderate depression, 15-19 = Moderately severe depression, 20-27 = Severe depression   Psychosocial Evaluation and Intervention: Psychosocial Evaluation - 01/12/18 1705      Psychosocial Evaluation & Interventions   Interventions  Stress management education;Relaxation education;Encouraged to exercise with the program and follow exercise prescription    Comments  Tracey [name] has stress r/t her CV event.  She declines utilizing the spiritual care department at this time. Will continue to monitor.  Pt enjoys golfing, gardening, and spending time with her grandkids.    Expected Outcomes  Pt will exhibit a positive outlook utilizing good coping skills.     Continue Psychosocial Services   Follow up required by staff       Psychosocial Re-Evaluation: Psychosocial Re-Evaluation    [place] Name 01/27/18 1125 02/23/18 1639 03/24/18 0714         Psychosocial Re-Evaluation   Current issues with  None Identified  None Identified  None Identified     Comments  Tracey [name] feels that she is managing her stress r/t to her CV event well.  No intervention necessary at this time.  Tracey [name] continues to manage her stress well.  No intervention necessary at this time.  Tracey [name] is managing her stress well.  No intervention necessary at this time.     Expected Outcomes  Swan will exhibit a positive outlook with good coping skills.  Saydie will exhibit a positive outlook with good coping skills.  Claryssa will maintain a positive outlook with good coping skills.     Interventions  Encouraged to attend Cardiac Rehabilitation for the exercise  Encouraged to attend  Cardiac Rehabilitation for the exercise  Encouraged to attend Cardiac Rehabilitation for the exercise;Stress management education;Relaxation education     Continue Psychosocial Services   No Follow up required  No Follow up required  No Follow up required        Psychosocial Discharge (Final Psychosocial Re-Evaluation): Psychosocial Re-Evaluation - 03/24/18 6438      Psychosocial Re-Evaluation   Current issues with  None Identified  Comments  Tracey [name] is managing her stress well.  No intervention necessary at this time.    Expected Outcomes  Tracey [name] will maintain a positive outlook with good coping skills.    Interventions  Encouraged to attend Cardiac Rehabilitation for the exercise;Stress management education;Relaxation education    Continue Psychosocial Services   No Follow up required       Vocational Rehabilitation: Provide vocational rehab assistance to qualifying candidates.   Vocational Rehab Evaluation & Intervention: Vocational Rehab - 01/06/18 1610      Initial Vocational Rehab Evaluation & Intervention   Assessment shows need for Vocational Rehabilitation  No       Education: Education Goals: Education classes will be provided on a weekly basis, covering required topics. Participant will state understanding/return demonstration of topics presented.  Learning Barriers/Preferences: Learning Barriers/Preferences - 01/06/18 1514      Learning Barriers/Preferences   Learning Barriers  Sight    Learning Preferences  Skilled Demonstration;Pictoral;Verbal Instruction;Individual Instruction       Education Topics: Count Your Pulse:  -Group instruction provided by verbal instruction, demonstration, patient participation and written materials to support subject.  Instructors address importance of being able to find your pulse and how to count your pulse when at home without a heart monitor.  Patients get hands on experience counting their pulse with staff help and  individually.   CARDIAC REHAB PHASE II EXERCISE from 03/23/2018 in [place]  Date  02/11/18  Educator  RN  Instruction Review Code  2- Demonstrated Understanding      Heart Attack, Angina, and Risk Factor Modification:  -Group instruction provided by verbal instruction, video, and written materials to support subject.  Instructors address signs and symptoms of angina and heart attacks.    Also discuss risk factors for heart disease and how to make changes to improve heart health risk factors.   CARDIAC REHAB PHASE II EXERCISE from 03/23/2018 in [place]  Date  03/23/18  Instruction Review Code  2- Demonstrated Understanding      Functional Fitness:  -Group instruction provided by verbal instruction, demonstration, patient participation, and written materials to support subject.  Instructors address safety measures for doing things around the house.  Discuss how to get up and down off the floor, how to pick things up properly, how to safely get out of a chair without assistance, and balance training.   CARDIAC REHAB PHASE II EXERCISE from 03/23/2018 in [place]  Date  02/25/18  Instruction Review Code  2- Demonstrated Understanding      Meditation and Mindfulness:  -Group instruction provided by verbal instruction, patient participation, and written materials to support subject.  Instructor addresses importance of mindfulness and meditation practice to help reduce stress and improve awareness.  Instructor also leads participants through a meditation exercise.    CARDIAC REHAB PHASE II EXERCISE from 03/23/2018 in [place]  Date  03/02/18  Instruction Review Code  2- Demonstrated Understanding      Stretching for Flexibility and Mobility:  -Group instruction provided by verbal instruction, patient participation, and written materials to support subject.   Instructors lead participants through series of stretches that are designed to increase flexibility thus improving mobility.  These stretches are additional exercise for major muscle groups that are typically performed during regular warm up and cool down.   Hands Only CPR:  -Group verbal, video, and participation provides a basic overview of  AHA guidelines for community CPR. Role-play of emergencies allow participants the opportunity to practice calling for help and chest compression technique with discussion of AED use.   Hypertension: -Group verbal and written instruction that provides a basic overview of hypertension including the most recent diagnostic guidelines, risk factor reduction with self-care instructions and medication management.    Nutrition I class: Heart Healthy Eating:  -Group instruction provided by PowerPoint slides, verbal discussion, and written materials to support subject matter. The instructor gives an explanation and review of the Therapeutic Lifestyle Changes diet recommendations, which includes a discussion on lipid goals, dietary fat, sodium, fiber, plant stanol/sterol esters, sugar, and the components of a well-balanced, healthy diet.   Nutrition II class: Lifestyle Skills:  -Group instruction provided by PowerPoint slides, verbal discussion, and written materials to support subject matter. The instructor gives an explanation and review of label reading, grocery shopping for heart health, heart healthy recipe modifications, and ways to make healthier choices when eating out.   Diabetes Question & Answer:  -Group instruction provided by PowerPoint slides, verbal discussion, and written materials to support subject matter. The instructor gives an explanation and review of diabetes co-morbidities, pre- and post-prandial blood glucose goals, pre-exercise blood glucose goals, signs, symptoms, and treatment of hypoglycemia and hyperglycemia, and foot care basics.    CARDIAC REHAB PHASE II EXERCISE from 03/23/2018 in [place]  Date  02/09/18  Educator  RD  Instruction Review Code  2- Demonstrated Understanding      Diabetes Blitz:  -Group instruction provided by PowerPoint slides, verbal discussion, and written materials to support subject matter. The instructor gives an explanation and review of the physiology behind type 1 and type 2 diabetes, diabetes medications and rational behind using different medications, pre- and post-prandial blood glucose recommendations and Hemoglobin A1c goals, diabetes diet, and exercise including blood glucose guidelines for exercising safely.    Portion Distortion:  -Group instruction provided by PowerPoint slides, verbal discussion, written materials, and food models to support subject matter. The instructor gives an explanation of serving size versus portion size, changes in portions sizes over the last 20 years, and what consists of a serving from each food group.   Stress Management:  -Group instruction provided by verbal instruction, video, and written materials to support subject matter.  Instructors review role of stress in heart disease and how to cope with stress positively.     CARDIAC REHAB PHASE II EXERCISE from 03/23/2018 in [place]  Date  03/09/18  Educator  RN  Instruction Review Code  2- Demonstrated Understanding      Exercising on Your Own:  -Group instruction provided by verbal instruction, power point, and written materials to support subject.  Instructors discuss benefits of exercise, components of exercise, frequency and intensity of exercise, and end points for exercise.  Also discuss use of nitroglycerin and activating EMS.  Review options of places to exercise outside of rehab.  Review guidelines for sex with heart disease.   CARDIAC REHAB PHASE II EXERCISE from 03/23/2018 in [place]  Date   02/04/18  Instruction Review Code  2- Demonstrated Understanding      Cardiac Drugs I:  -Group instruction provided by verbal instruction and written materials to support subject.  Instructor reviews cardiac drug classes: antiplatelets, anticoagulants, beta blockers, and statins.  Instructor discusses reasons, side effects, and lifestyle considerations for each drug class.   CARDIAC REHAB PHASE II EXERCISE from  03/23/2018 in [place]  Date  02/23/18  Instruction Review Code  2- Demonstrated Understanding      Cardiac Drugs II:  -Group instruction provided by verbal instruction and written materials to support subject.  Instructor reviews cardiac drug classes: angiotensin converting enzyme inhibitors (ACE-I), angiotensin II receptor blockers (ARBs), nitrates, and calcium channel blockers.  Instructor discusses reasons, side effects, and lifestyle considerations for each drug class.   Anatomy and Physiology of the Circulatory System:  Group verbal and written instruction and models provide basic cardiac anatomy and physiology, with the coronary electrical and arterial systems. Review of: AMI, Angina, Valve disease, Heart Failure, Peripheral Artery Disease, Cardiac Arrhythmia, Pacemakers, and the ICD.   CARDIAC REHAB PHASE II EXERCISE from 03/23/2018 in [place]  Date  03/16/18  Instruction Review Code  2- Demonstrated Understanding      Other Education:  -Group or individual verbal, written, or video instructions that support the educational goals of the cardiac rehab program.   Holiday Eating Survival Tips:  -Group instruction provided by PowerPoint slides, verbal discussion, and written materials to support subject matter. The instructor gives patients tips, tricks, and techniques to help them not only survive but enjoy the holidays despite the onslaught of food that accompanies the holidays.   Knowledge Questionnaire  Score: Knowledge Questionnaire Score - 01/06/18 1514      Knowledge Questionnaire Score   Pre Score  22/24       Core Components/Risk Factors/Patient Goals at Admission: Personal Goals and Risk Factors at Admission - 01/06/18 1506      Core Components/Risk Factors/Patient Goals on Admission    Weight Management  Yes;Weight Loss    Intervention  Weight Management: Develop a combined nutrition and exercise program designed to reach desired caloric intake, while maintaining appropriate intake of nutrient and fiber, sodium and fats, and appropriate energy expenditure required for the weight goal.;Weight Management: Provide education and appropriate resources to help participant work on and attain dietary goals.    Admit Weight  144 lb 13.5 oz (65.7 kg)    Goal Weight: Short Term  139 lb (63 kg)    Goal Weight: Long Term  134 lb (60.8 kg)    Expected Outcomes  Long Term: Adherence to nutrition and physical activity/exercise program aimed toward attainment of established weight goal;Short Term: Continue to assess and modify interventions until short term weight is achieved;Weight Loss: Understanding of general recommendations for a balanced deficit meal plan, which promotes 1-2 lb weight loss per week and includes a negative energy balance of [phone] kcal/d;Understanding recommendations for meals to include 15-35% energy as protein, 25-35% energy from fat, 35-60% energy from carbohydrates, less than 297m of dietary cholesterol, 20-35 gm of total fiber daily;Understanding of distribution of calorie intake throughout the day with the consumption of 4-5 meals/snacks    Hypertension  Yes    Intervention  Provide education on lifestyle modifcations including regular physical activity/exercise, weight management, moderate sodium restriction and increased consumption of fresh fruit, vegetables, and low fat dairy, alcohol moderation, and smoking cessation.;Monitor prescription use compliance.    Expected  Outcomes  Short Term: Continued assessment and intervention until BP is < 140/943mHG in hypertensive participants. < 130/8036mG in hypertensive participants with diabetes, heart failure or chronic kidney disease.;Long Term: Maintenance of blood pressure at goal levels.    Lipids  Yes    Intervention  Provide education and support for participant on nutrition & aerobic/resistive exercise along  with prescribed medications to achieve LDL <69m, HDL >474m    Expected Outcomes  Short Term: Participant states understanding of desired cholesterol values and is compliant with medications prescribed. Participant is following exercise prescription and nutrition guidelines.;Long Term: Cholesterol controlled with medications as prescribed, with individualized exercise RX and with personalized nutrition plan. Value goals: LDL < 7065mHDL > 40 mg.    Stress  Yes    Intervention  Offer individual and/or small group education and counseling on adjustment to heart disease, stress management and health-related lifestyle change. Teach and support self-help strategies.;Refer participants experiencing significant psychosocial distress to appropriate mental health specialists for further evaluation and treatment. When possible, include family members and significant others in education/counseling sessions.    Expected Outcomes  Short Term: Participant demonstrates changes in health-related behavior, relaxation and other stress management skills, ability to obtain effective social support, and compliance with psychotropic medications if prescribed.;Long Term: Emotional wellbeing is indicated by absence of clinically significant psychosocial distress or social isolation.       Core Components/Risk Factors/Patient Goals Review:  Goals and Risk Factor Review    Row Name 01/12/18 1704 01/27/18 1124 02/23/18 1638 03/24/18 0716       Core Components/Risk Factors/Patient Goals Review   Personal Goals Review  Weight  Management/Obesity;Lipids;Hypertension;Stress  Weight Management/Obesity;Lipids;Hypertension;Stress  Weight Management/Obesity;Lipids;Hypertension;Stress  Weight Management/Obesity;Lipids;Hypertension;Stress    Review  Pt with multiple CAD RF willing to resume CR exercise.  Pt is hoping to be able to get back to golfing 18 holes.  Pt with multiple CAD RF willing to resume CR exercise.  Pt is hoping to be able to get back to golfing 18 holes and will be walking and golfing during her upcoming vacation.  Pt with multiple CAD RF willing to participate in CR exercise.  Tracey [name] able to golf 18 holes during vacation.  She feels that she has increased her strength.  Pt with multiple CAD RF willing to participate in CR exercise.  Tracey [name] enjoying Cardiac Rehab and has expressed interest in participating in maintenance program upon her upcoming graduation.  She feels that she has increased her strength and stamina to continue to allow her to play golf on Tuesdays.    Expected Outcomes  Pt with multiple CAD RF will participate in CR exercise, nutrtition, and lifestyle modification opportunities.   Pt with multiple CAD RF will participate in CR exercise, nutrtition, and lifestyle modification opportunities.   Pt with multiple CAD RF will participate in CR exercise, nutrtition, and lifestyle modification opportunities.   Pt with multiple CAD RF will participate in CR exercise, nutrtition, and lifestyle modification opportunities.        Core Components/Risk Factors/Patient Goals at Discharge (Final Review):  Goals and Risk Factor Review - 03/24/18 0716      Core Components/Risk Factors/Patient Goals Review   Personal Goals Review  Weight Management/Obesity;Lipids;Hypertension;Stress    Review  Pt with multiple CAD RF willing to participate in CR exercise.  Tracey [name] enjoying Cardiac Rehab and has expressed interest in participating in maintenance program upon her upcoming graduation.  She feels that she has  increased her strength and stamina to continue to allow her to play golf on Tuesdays.    Expected Outcomes  Pt with multiple CAD RF will participate in CR exercise, nutrtition, and lifestyle modification opportunities.        ITP Comments: ITP Comments    Row Name 01/06/18 1454 01/27/18 1124 02/23/18 1635 03/24/18 0717     ITP Comments  Dr. Fransico Him, Medical Director  30 Day ITP Review.  Abcde is tolerating exercise well.  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  Aliviyah will be on vacation and plans to exercise.  30 Day ITP Review.  Cherine is tolerating exercise well after vacation.  She feels that she is increasing her stamina.   30 Day ITP Review.  Erminia is continuing to do well in CR Program.  She will be graduating soon and has expressed interest in the maintenance program. She has enjoyed cardiac rehab thus far.       Comments: See ITP Comments.

## 2018-03-25 ENCOUNTER — Encounter (HOSPITAL_COMMUNITY): Payer: Medicare Other

## 2018-03-28 ENCOUNTER — Encounter (HOSPITAL_COMMUNITY)
Admission: RE | Admit: 2018-03-28 | Discharge: 2018-03-28 | Disposition: A | Discharge status: Home / Self Care | Payer: Medicare Other | Source: Ambulatory Visit | Attending: Cardiovascular Disease | Admitting: Cardiovascular Disease

## 2018-03-28 ENCOUNTER — Encounter (HOSPITAL_COMMUNITY): Payer: Medicare Other

## 2018-03-28 DIAGNOSIS — Z955 Presence of coronary angioplasty implant and graft: Secondary | ICD-10-CM

## 2018-03-28 DIAGNOSIS — Z7982 Long term (current) use of aspirin: Secondary | ICD-10-CM | POA: Diagnosis not present

## 2018-03-28 DIAGNOSIS — Z853 Personal history of malignant neoplasm of breast: Secondary | ICD-10-CM | POA: Diagnosis not present

## 2018-03-28 DIAGNOSIS — I214 Non-ST elevation (NSTEMI) myocardial infarction: Secondary | ICD-10-CM | POA: Diagnosis not present

## 2018-03-28 DIAGNOSIS — G4733 Obstructive sleep apnea (adult) (pediatric): Secondary | ICD-10-CM | POA: Diagnosis not present

## 2018-03-28 DIAGNOSIS — Z79899 Other long term (current) drug therapy: Secondary | ICD-10-CM | POA: Diagnosis not present

## 2018-03-30 ENCOUNTER — Encounter (HOSPITAL_COMMUNITY)
Admission: RE | Admit: 2018-03-30 | Discharge: 2018-03-30 | Disposition: A | Discharge status: Home / Self Care | Payer: Medicare Other | Source: Ambulatory Visit | Attending: Cardiovascular Disease | Admitting: Cardiovascular Disease

## 2018-03-30 ENCOUNTER — Encounter (HOSPITAL_COMMUNITY): Payer: Medicare Other

## 2018-03-30 DIAGNOSIS — Z853 Personal history of malignant neoplasm of breast: Secondary | ICD-10-CM | POA: Diagnosis not present

## 2018-03-30 DIAGNOSIS — G4733 Obstructive sleep apnea (adult) (pediatric): Secondary | ICD-10-CM | POA: Diagnosis not present

## 2018-03-30 DIAGNOSIS — Z955 Presence of coronary angioplasty implant and graft: Secondary | ICD-10-CM

## 2018-03-30 DIAGNOSIS — I214 Non-ST elevation (NSTEMI) myocardial infarction: Secondary | ICD-10-CM

## 2018-03-30 DIAGNOSIS — Z7982 Long term (current) use of aspirin: Secondary | ICD-10-CM | POA: Diagnosis not present

## 2018-03-30 DIAGNOSIS — Z79899 Other long term (current) drug therapy: Secondary | ICD-10-CM | POA: Diagnosis not present

## 2018-04-01 ENCOUNTER — Encounter (HOSPITAL_COMMUNITY)
Admission: RE | Admit: 2018-04-01 | Discharge: 2018-04-01 | Disposition: A | Discharge status: Home / Self Care | Payer: Medicare Other | Source: Ambulatory Visit | Attending: Cardiovascular Disease | Admitting: Cardiovascular Disease

## 2018-04-01 ENCOUNTER — Encounter (HOSPITAL_COMMUNITY): Payer: Medicare Other

## 2018-04-01 DIAGNOSIS — Z853 Personal history of malignant neoplasm of breast: Secondary | ICD-10-CM | POA: Diagnosis not present

## 2018-04-01 DIAGNOSIS — Z955 Presence of coronary angioplasty implant and graft: Secondary | ICD-10-CM | POA: Diagnosis not present

## 2018-04-01 DIAGNOSIS — Z79899 Other long term (current) drug therapy: Secondary | ICD-10-CM | POA: Diagnosis not present

## 2018-04-01 DIAGNOSIS — I214 Non-ST elevation (NSTEMI) myocardial infarction: Secondary | ICD-10-CM

## 2018-04-01 DIAGNOSIS — Z7982 Long term (current) use of aspirin: Secondary | ICD-10-CM | POA: Diagnosis not present

## 2018-04-01 DIAGNOSIS — G4733 Obstructive sleep apnea (adult) (pediatric): Secondary | ICD-10-CM | POA: Diagnosis not present

## 2018-04-01 DIAGNOSIS — N390 Urinary tract infection, site not specified: Secondary | ICD-10-CM | POA: Diagnosis not present

## 2018-04-04 ENCOUNTER — Encounter (HOSPITAL_COMMUNITY): Payer: Medicare Other

## 2018-04-04 ENCOUNTER — Encounter (HOSPITAL_COMMUNITY)
Admission: RE | Admit: 2018-04-04 | Discharge: 2018-04-04 | Disposition: A | Discharge status: Home / Self Care | Payer: Medicare Other | Source: Ambulatory Visit | Attending: Cardiovascular Disease | Admitting: Cardiovascular Disease

## 2018-04-04 DIAGNOSIS — Z955 Presence of coronary angioplasty implant and graft: Secondary | ICD-10-CM | POA: Diagnosis not present

## 2018-04-04 DIAGNOSIS — I214 Non-ST elevation (NSTEMI) myocardial infarction: Secondary | ICD-10-CM

## 2018-04-04 DIAGNOSIS — Z7982 Long term (current) use of aspirin: Secondary | ICD-10-CM | POA: Diagnosis not present

## 2018-04-04 DIAGNOSIS — Z853 Personal history of malignant neoplasm of breast: Secondary | ICD-10-CM | POA: Diagnosis not present

## 2018-04-04 DIAGNOSIS — G4733 Obstructive sleep apnea (adult) (pediatric): Secondary | ICD-10-CM | POA: Diagnosis not present

## 2018-04-04 DIAGNOSIS — Z79899 Other long term (current) drug therapy: Secondary | ICD-10-CM | POA: Diagnosis not present

## 2018-04-04 NOTE — Progress Notes (Signed)
Discharge Progress Report  Patient Details  Name: Tracey [name] MRN: [id number] Date of Birth: 11-04-1945 Referring Provider:     CARDIAC REHAB PHASE II ORIENTATION from 01/06/2018 in [place]  Referring Provider  Sherren Mocha MD       Number of Visits: 36  Reason for Discharge:  Patient reached a stable level of exercise. Patient independent in their exercise. Patient has met program and personal goals.  Smoking History:  Social History   Tobacco Use  Smoking Status Former Smoker  . Packs/day: 1.00  . Years: 20.00  . Pack years: 20.00  . Start date: 06/09/1992  Smokeless Tobacco Never Used  Tobacco Comment   quit at age 69    Diagnosis:  Status post coronary artery stent placement  NSTEMI (non-ST elevated myocardial infarction) ([place])  ADL [facility]:   Initial Exercise Prescription: Initial Exercise Prescription - 01/06/18 1500      Date of Initial Exercise RX and Referring Provider   Date  01/06/18    Referring Provider  Sherren Mocha MD      Recumbant Bike   Level  1.2    Watts  10    Minutes  10    METs  2.4      NuStep   Level  2    SPM  80    Minutes  10    METs  2.2      Arm Ergometer   Level  1    Watts  15    Minutes  10    METs  2.3      Prescription Details   Frequency (times per week)  3x    Duration  Progress to 30 minutes of continuous aerobic without signs/symptoms of physical distress      Intensity   THRR 40-80% of Max Heartrate  59-118    Ratings of Perceived Exertion  11-13    Perceived Dyspnea  0-4      Progression   Progression  Continue progressive overload as per policy without signs/symptoms or physical distress.      Resistance Training   Training Prescription  Yes    Weight  3lbs    Reps  10-15       Discharge Exercise Prescription (Final Exercise Prescription Changes): Exercise Prescription Changes - 04/04/18 1400      Response to Exercise   Blood Pressure (Admit)   126/80    Blood Pressure (Exercise)  126/80    Blood Pressure (Exit)  120/70    Heart Rate (Admit)  93 bpm    Heart Rate (Exercise)  104 bpm    Heart Rate (Exit)  89 bpm    Rating of Perceived Exertion (Exercise)  12    Perceived Dyspnea (Exercise)  0    Symptoms  None    Comments  Pt graduated from Cardiac Rehab     Duration  Continue with 45 min of aerobic exercise without signs/symptoms of physical distress.    Intensity  THRR unchanged      Progression   Progression  Continue to progress workloads to maintain intensity without signs/symptoms of physical distress.    Average METs  3      Resistance Training   Training Prescription  Yes    Weight  3lbs    Reps  10-15    Time  10 Minutes      Interval Training   Interval Training  No      NuStep  Level  5    SPM  110    Minutes  20    METs  2.6      Arm Ergometer   Level  3    Watts  18    Minutes  10    METs  2.9      Home Exercise Plan   Plans to continue exercise at  Maintenance    Frequency  Add 3 additional days to program exercise sessions.    Initial Home Exercises Provided  02/04/18       Functional Capacity: 6 Minute Walk    Row Name 01/06/18 1507 03/09/18 1510       6 Minute Walk   Phase  Initial  Discharge    Distance  1238 feet  1283 feet    Distance % Change  -  3.63 %    Distance Feet Change  -  45 ft    Walk Time  6 minutes  6 minutes    # of Rest Breaks  0  0    MPH  2.34  2.43    METS  2.4  2.5    RPE  11  11    Perceived Dyspnea   0  0    VO2 Peak  8.42  8.74    Symptoms  No  No    Resting HR  69 bpm  75 bpm    Resting BP  102/62  108/66    Resting Oxygen Saturation   100 %  -    Exercise Oxygen Saturation  during 6 min walk  100 %  -    Max Ex. HR  93 bpm  96 bpm    Max Ex. BP  120/60  114/60    2 Minute Post BP  102/70  110/70       Psychological, QOL, Others - Outcomes: PHQ 2/9: Depression screen [facility] 2/9 04/13/2018 01/12/2018 01/06/2017 03/30/2016 02/10/2015  Decreased  Interest 0 0 0 0 0  Down, Depressed, Hopeless 0 0 0 0 0  PHQ - 2 Score 0 0 0 0 0  Some recent data might be hidden    Quality of Life: Quality of Life - 04/04/18 1406      Quality of Life   Select  Quality of Life      Quality of Life Scores   Health/Function Pre  20.57 %    Health/Function Post  21.43 %    Health/Function % Change  4.18 %    Socioeconomic Pre  27 %    Socioeconomic Post  26.79 %    Socioeconomic % Change   -0.78 %    Psych/Spiritual Pre  25.5 %    Psych/Spiritual Post  26.43 %    Psych/Spiritual % Change  3.65 %    Family Pre  26.38 %    Family Post  27.1 %    Family % Change  2.73 %    GLOBAL Pre  23.78 %    GLOBAL Post  24.4 %    GLOBAL % Change  2.61 %       Personal Goals: Goals established at orientation with interventions provided to work toward goal. Personal Goals and Risk Factors at Admission - 01/06/18 1506      Core Components/Risk Factors/Patient Goals on Admission    Weight Management  Yes;Weight Loss    Intervention  Weight Management: Develop a combined nutrition and exercise program designed to reach desired caloric intake, while maintaining  appropriate intake of nutrient and fiber, sodium and fats, and appropriate energy expenditure required for the weight goal.;Weight Management: Provide education and appropriate resources to help participant work on and attain dietary goals.    Admit Weight  144 lb 13.5 oz (65.7 kg)    Goal Weight: Short Term  139 lb (63 kg)    Goal Weight: Long Term  134 lb (60.8 kg)    Expected Outcomes  Long Term: Adherence to nutrition and physical activity/exercise program aimed toward attainment of established weight goal;Short Term: Continue to assess and modify interventions until short term weight is achieved;Weight Loss: Understanding of general recommendations for a balanced deficit meal plan, which promotes 1-2 lb weight loss per week and includes a negative energy balance of [id number] kcal/d;Understanding  recommendations for meals to include 15-35% energy as protein, 25-35% energy from fat, 35-60% energy from carbohydrates, less than 291m of dietary cholesterol, 20-35 gm of total fiber daily;Understanding of distribution of calorie intake throughout the day with the consumption of 4-5 meals/snacks    Hypertension  Yes    Intervention  Provide education on lifestyle modifcations including regular physical activity/exercise, weight management, moderate sodium restriction and increased consumption of fresh fruit, vegetables, and low fat dairy, alcohol moderation, and smoking cessation.;Monitor prescription use compliance.    Expected Outcomes  Short Term: Continued assessment and intervention until BP is < 140/923mHG in hypertensive participants. < 130/8031mG in hypertensive participants with diabetes, heart failure or chronic kidney disease.;Long Term: Maintenance of blood pressure at goal levels.    Lipids  Yes    Intervention  Provide education and support for participant on nutrition & aerobic/resistive exercise along with prescribed medications to achieve LDL <16m60mDL >40mg75m Expected Outcomes  Short Term: Participant states understanding of desired cholesterol values and is compliant with medications prescribed. Participant is following exercise prescription and nutrition guidelines.;Long Term: Cholesterol controlled with medications as prescribed, with individualized exercise RX and with personalized nutrition plan. Value goals: LDL < 16mg,48m > 40 mg.    Stress  Yes    Intervention  Offer individual and/or small group education and counseling on adjustment to heart disease, stress management and health-related lifestyle change. Teach and support self-help strategies.;Refer participants experiencing significant psychosocial distress to appropriate mental health specialists for further evaluation and treatment. When possible, include family members and significant others in education/counseling  sessions.    Expected Outcomes  Short Term: Participant demonstrates changes in health-related behavior, relaxation and other stress management skills, ability to obtain effective social support, and compliance with psychotropic medications if prescribed.;Long Term: Emotional wellbeing is indicated by absence of clinically significant psychosocial distress or social isolation.        Personal Goals Discharge: Goals and Risk Factor Review    Row Name 01/12/18 1704 01/27/18 1124 02/23/18 1638 03/24/18 0716 04/13/18 1224     Core Components/Risk Factors/Patient Goals Review   Personal Goals Review  Weight Management/Obesity;Lipids;Hypertension;Stress  Weight Management/Obesity;Lipids;Hypertension;Stress  Weight Management/Obesity;Lipids;Hypertension;Stress  Weight Management/Obesity;Lipids;Hypertension;Stress  Weight Management/Obesity;Lipids;Hypertension;Stress   Review  Pt with multiple CAD RF willing to resume CR exercise.  Pt is hoping to be able to get back to golfing 18 holes.  Pt with multiple CAD RF willing to resume CR exercise.  Pt is hoping to be able to get back to golfing 18 holes and will be walking and golfing during her upcoming vacation.  Pt with multiple CAD RF willing to participate in CR exercise.  Jazzmin Zykiriable to golf 18  holes during vacation.  She feels that she has increased her strength.  Pt with multiple CAD RF willing to participate in CR exercise.  Laneah is enjoying Cardiac Rehab and has expressed interest in participating in maintenance program upon her upcoming graduation.  She feels that she has increased her strength and stamina to continue to allow her to play golf on Tuesdays.  Pt with multiple CAD RF willing to participate in CR exercise.  Jozelyn is enjoying Cardiac Rehab and has expressed interest in participating in maintenance program upon her upcoming graduation.  She feels that she has increased her strength and stamina to continue to allow her to play golf on  Tuesdays.   Expected Outcomes  Pt with multiple CAD RF will participate in CR exercise, nutrtition, and lifestyle modification opportunities.   Pt with multiple CAD RF will participate in CR exercise, nutrtition, and lifestyle modification opportunities.   Pt with multiple CAD RF will participate in CR exercise, nutrtition, and lifestyle modification opportunities.   Pt with multiple CAD RF will participate in CR exercise, nutrtition, and lifestyle modification opportunities.   Pt with multiple CAD RF will participate in CR maintenance upon graduation from phase 2  cardiac rehab., nutrtition, and lifestyle modification opportunities.       Exercise Goals and Review: Exercise Goals    Row Name 01/06/18 1511             Exercise Goals   Increase Physical Activity  Yes       Intervention  Provide advice, education, support and counseling about physical activity/exercise needs.;Develop an individualized exercise prescription for aerobic and resistive training based on initial evaluation findings, risk stratification, comorbidities and participant's personal goals.       Expected Outcomes  Short Term: Attend rehab on a regular basis to increase amount of physical activity.;Long Term: Add in home exercise to make exercise part of routine and to increase amount of physical activity.;Long Term: Exercising regularly at least 3-5 days a week.       Increase Strength and Stamina  Yes       Intervention  Provide advice, education, support and counseling about physical activity/exercise needs.;Develop an individualized exercise prescription for aerobic and resistive training based on initial evaluation findings, risk stratification, comorbidities and participant's personal goals.       Expected Outcomes  Short Term: Increase workloads from initial exercise prescription for resistance, speed, and METs.;Short Term: Perform resistance training exercises routinely during rehab and add in resistance training at  home;Long Term: Improve cardiorespiratory fitness, muscular endurance and strength as measured by increased METs and functional capacity (6MWT)       Able to understand and use rate of perceived exertion (RPE) scale  Yes       Intervention  Provide education and explanation on how to use RPE scale       Expected Outcomes  Short Term: Able to use RPE daily in rehab to express subjective intensity level;Long Term:  Able to use RPE to guide intensity level when exercising independently       Knowledge and understanding of Target Heart Rate Range (THRR)  Yes       Intervention  Provide education and explanation of THRR including how the numbers were predicted and where they are located for reference       Expected Outcomes  Short Term: Able to state/look up THRR;Short Term: Able to use daily as guideline for intensity in rehab;Long Term: Able to use THRR to govern intensity  when exercising independently       Able to check pulse independently  Yes       Intervention  Provide education and demonstration on how to check pulse in carotid and radial arteries.;Review the importance of being able to check your own pulse for safety during independent exercise       Expected Outcomes  Long Term: Able to check pulse independently and accurately;Short Term: Able to explain why pulse checking is important during independent exercise       Understanding of Exercise Prescription  Yes       Intervention  Provide education, explanation, and written materials on patient's individual exercise prescription       Expected Outcomes  Short Term: Able to explain program exercise prescription;Long Term: Able to explain home exercise prescription to exercise independently          Nutrition & Weight - Outcomes: Pre Biometrics - 01/06/18 1512      Pre Biometrics   Height  5' 1.5" (1.562 m)    Weight  65.7 kg    Waist Circumference  38 inches    Hip Circumference  39.5 inches    Waist to Hip Ratio  0.96 %    BMI  (Calculated)  26.93    Triceps Skinfold  27 mm    % Body Fat  40.3 %    Grip Strength  25 kg    Flexibility  25 in    Single Leg Stand  18.1 seconds      Post Biometrics - 03/09/18 1511       Post  Biometrics   Height  5' 1.5" (1.562 m)    Weight  64.2 kg    Waist Circumference  37.5 inches    Hip Circumference  38 inches    Waist to Hip Ratio  0.99 %    BMI (Calculated)  26.31    Triceps Skinfold  25 mm    % Body Fat  39.4 %    Grip Strength  28 kg    Flexibility  14 in    Single Leg Stand  30 seconds       Nutrition: Nutrition Therapy & Goals - 01/07/18 0800      Nutrition Therapy   Diet  Heart Healthy      Personal Nutrition Goals   Nutrition Goal  Pt to identify food quantities necessary to achieve weight loss of 8 lb at graduation from cardiac rehab.      Intervention Plan   Intervention  Prescribe, educate and counsel regarding individualized specific dietary modifications aiming towards targeted core components such as weight, hypertension, lipid management, diabetes, heart failure and other comorbidities.    Expected Outcomes  Short Term Goal: Understand basic principles of dietary content, such as calories, fat, sodium, cholesterol and nutrients.;Long Term Goal: Adherence to prescribed nutrition plan.       Nutrition Discharge: Nutrition Assessments - 04/07/18 1209      MEDFICTS Scores   Pre Score  18    Post Score  6    Score Difference  -12       Education Questionnaire Score: Knowledge Questionnaire Score - 04/04/18 1406      Knowledge Questionnaire Score   Pre Score  22/24    Post Score  20/24       Goals reviewed with patient; copy given to patient. Telecia graduated from cardiac rehab program on 04/04/18 with completion of 36 exercise sessions in Phase II. Pt maintained good  attendance and progressed nicely during her participation in rehab as evidenced by increased MET level.   Medication list reconciled. Repeat  PHQ score =0  .  Pt has made  significant lifestyle changes and should be commended for her success. Pt feels she has achieved her goals during cardiac rehab.  Ayaana increased her distance on her post exercise Pt plans to continue exercise in cardiac maintenance program. We are proud of Emalene's progress.Barnet Pall, RN,BSN 04/13/2018 12:43 PM

## 2018-04-06 ENCOUNTER — Encounter (HOSPITAL_COMMUNITY): Payer: Medicare Other

## 2018-04-07 ENCOUNTER — Encounter: Payer: Self-pay | Admitting: Physician Assistant

## 2018-04-08 ENCOUNTER — Encounter (HOSPITAL_COMMUNITY): Payer: Medicare Other

## 2018-04-10 ENCOUNTER — Other Ambulatory Visit: Payer: Self-pay | Admitting: Oncology

## 2018-04-11 ENCOUNTER — Encounter (HOSPITAL_COMMUNITY): Payer: Medicare Other

## 2018-04-13 ENCOUNTER — Encounter (HOSPITAL_COMMUNITY): Payer: Medicare Other

## 2018-04-15 ENCOUNTER — Encounter (HOSPITAL_COMMUNITY): Payer: Medicare Other

## 2018-04-29 ENCOUNTER — Other Ambulatory Visit: Payer: Medicare Other | Admitting: *Deleted

## 2018-04-29 ENCOUNTER — Telehealth: Payer: Self-pay | Admitting: Cardiovascular Disease

## 2018-04-29 DIAGNOSIS — R748 Abnormal levels of other serum enzymes: Secondary | ICD-10-CM

## 2018-04-29 DIAGNOSIS — I2510 Atherosclerotic heart disease of native coronary artery without angina pectoris: Secondary | ICD-10-CM

## 2018-04-29 LAB — LIPID PANEL
CHOL/HDL RATIO: 2 ratio (ref 0.0–4.4)
Cholesterol, Total: 127 mg/dL (ref 100–199)
HDL: 64 mg/dL (ref >39)
LDL Calculated: 42 mg/dL (ref 0–99)
Triglycerides: 107 mg/dL (ref 0–149)
VLDL Cholesterol Cal: 21 mg/dL (ref 5–40)

## 2018-04-29 LAB — HEPATIC FUNCTION PANEL
ALBUMIN: 4.7 g/dL (ref 3.5–4.8)
ALT: 39 IU/L — AB (ref 0–32)
AST: 34 IU/L (ref 0–40)
Alkaline Phosphatase: 79 IU/L (ref 39–117)
BILIRUBIN TOTAL: 0.4 mg/dL (ref 0.0–1.2)
BILIRUBIN, DIRECT: 0.16 mg/dL (ref 0.00–0.40)
Total Protein: 6.6 g/dL (ref 6.0–8.5)

## 2018-04-29 NOTE — Telephone Encounter (Signed)
Left message that paperwork was signed and faxed to Cardiac Rehab today. Instructed her to call with questions or concerns.

## 2018-04-29 NOTE — Telephone Encounter (Signed)
New message    Pt said she has been doing cardiac rehab and has completed it. She would like to do the program to maintain it. Their office has requested the ok twice but have not heard anything. Please contact the cardiac rehab.

## 2018-05-02 ENCOUNTER — Encounter: Payer: Self-pay | Admitting: Physician Assistant

## 2018-05-02 ENCOUNTER — Ambulatory Visit (INDEPENDENT_AMBULATORY_CARE_PROVIDER_SITE_OTHER): Payer: Medicare Other | Admitting: Physician Assistant

## 2018-05-02 VITALS — BP 138/74 | HR 62 | Ht 62.0 in | Wt 146.0 lb

## 2018-05-02 DIAGNOSIS — I2510 Atherosclerotic heart disease of native coronary artery without angina pectoris: Secondary | ICD-10-CM

## 2018-05-02 DIAGNOSIS — R002 Palpitations: Secondary | ICD-10-CM | POA: Diagnosis not present

## 2018-05-02 DIAGNOSIS — I10 Essential (primary) hypertension: Secondary | ICD-10-CM

## 2018-05-02 DIAGNOSIS — E785 Hyperlipidemia, unspecified: Secondary | ICD-10-CM | POA: Diagnosis not present

## 2018-05-02 MED ORDER — EZETIMIBE 10 MG PO TABS: 10.0000 mg | ORAL_TABLET | Freq: Every day | ORAL | 3 refills | Status: DC

## 2018-05-02 MED ORDER — METOPROLOL TARTRATE 25 MG PO TABS: 12.5000 mg | ORAL_TABLET | Freq: Every evening | ORAL | 3 refills | Status: DC

## 2018-05-02 NOTE — Progress Notes (Signed)
Cardiology Office Note:    Date:  05/02/2018   ID:  Tracey [name], DOB 1946-07-08, MRN [id number]  PCP:  Tracey Hatchet, MD  Cardiologist:  Tracey Mocha, MD  Electrophysiologist:  None   Referring MD: Tracey Hatchet, MD   Chief Complaint  Patient presents with  . Follow-up    CAD    History of Present Illness:    Tracey [name] is a 72 y.o. female with coronary artery disease s/p non-ST elevation myocardial infarction in January 2019 while in [place], [place].  She was transferred to [facility] and had PCI and DES to the LAD.  The procedure was c/b stent thrombosis requiring a second stent.  She established with Dr. Burt [name] in 12/2017.  She complained of fatigue and her beta-blocker dose was reduced.   Follow-up labs demonstrated optimal lipids.  However, her LFTs were elevated.  Atorvastatin was changed to rosuvastatin with improved LFTs.     [name] returns for follow-up on coronary disease.  Unfortunately, she just got off the phone with her son who has been diagnosed with advanced cirrhosis.  He is not expected to live much longer.  She continues to note issues with fatigue.  She feels that she has had some minimal improvement since decreasing her beta-blocker.  She had one episode of rapid palpitations.  This occurred while working outside in extreme heat.  She took nitroglycerin without much relief.  Her symptoms gradually resolved.  She has not had recurrent angina.  She does note occasional indigestion as well as dysphagia.  She denies significant shortness of breath, orthopnea, PND or leg swelling.  She denies syncope.  She has finished cardiac rehabilitation.  She would like to continue with the maintenance program.  Of note, she goes back to [place] in December.  Prior CV studies:   The following studies were reviewed today:  Nuclear stress test [facility] 11/01/2017 EF 81, no ischemia, submaximal heart rate (76% predicted maximal heart rate)  Echocardiogram  09/15/2017 [facility] EF 60, anteroseptal and apical hypokinesis, mild LVH  Cardiac catheterization 09/16/2017 [facility] LM normal LAD/diagonal 95% LCx normal RCA normal  Past Medical History:  Diagnosis Date  . Allergy   . Anxiety    takes Ativan daily prn  . Arthritis   . Breast cancer ([place]) 2014   ER+/PR+/HEr2-,   . Bruises easily   . Colon polyps    2 polyps by report  . Diverticulosis   . GERD (gastroesophageal reflux disease)    occasionally takes Nexium   . Gout    takes Allopurinol daily and Colchicine daily prn;last attack 70yr ago  . Gout   . History of bladder infections    many yrs ago  . History of bronchitis    last time at least 85yrago  . History of stress incontinence   . Hyperlipidemia    takes Pravastatin daily  . Insomnia    takes Ambien nightly prn  . OSA (obstructive sleep apnea)    on CPAP  . Osteoarthritis   . Peripheral edema    takes Furosemide daily prn  . PONV (postoperative nausea and vomiting)   . Postmenopausal hormone therapy   . Radiation 07/27/13-09/07/13   Right Breast x 31 treatments  . Rhinitis    uses Flonase prn  . Sinus congestion   . Status post breast reconstruction 10/15/14   Bilateral implant removal and DIEP [place]  . Tachycardia  takes Metoprolol daily  . Ventricular tachycardia, non-sustained ([place])    during sleep study 2009 with normal cardiac workup   Surgical Hx: The patient  has a past surgical history that includes Appendectomy; Gallbladder surgery; Cholecystectomy; Cosmetic surgery; Tonsillectomy; Abdominal hysterectomy; Cardiac catheterization (1999); Colonoscopy; bilateral cataract surgery; urinary tightening; Total mastectomy (Bilateral, 05/23/2013); Total mastectomy (Bilateral, 05/23/2013); Axillary sentinel node biopsy (Right, 05/23/2013); Breast reconstruction with placement of tissue expander and flex hd (acellular hydrated dermis) (Bilateral, 05/23/2013);  Breast reconstruction (Bilateral, 05/17/2014); and Removal of bilateral tissue expanders with placement of bilateral breast implants (Bilateral, 05/17/2014).   Current Medications: Current Meds  Medication Sig  . allopurinol (ZYLOPRIM) 100 MG tablet TAKE 1 TABLET BY MOUTH ONCE DAILY  . ASPIRIN LOW DOSE 81 MG chewable tablet Chew 81 mg by mouth daily.   . cholecalciferol 5000 UNITS TABS Take 0.2 tablets (1,000 Units total) by mouth daily.  . clopidogrel (PLAVIX) 75 MG tablet TK 1 T PO QD  TK 300 MG 4 T PO 1 TIME ON FIRST DAY THEN 75 MG QD  . colchicine 0.6 MG tablet TAKE 1 TABLET BY MOUTH EVERY DAY OR AS NEEDED  . Cyanocobalamin (VITAMIN B-12 IJ) Inject 1,000 mcg as directed every 30 (thirty) days. Normally 1st of the month  . cyclobenzaprine (FLEXERIL) 10 MG tablet TK 1 T PO Q 8 H PRF MUSCLE SPASMS  . exemestane (AROMASIN) 25 MG tablet Take 1 tablet (25 mg total) by mouth daily after breakfast.  . ezetimibe (ZETIA) 10 MG tablet Take 1 tablet (10 mg total) by mouth daily.  . fluticasone (FLONASE) 50 MCG/ACT nasal spray Place 1 spray into the nose daily as needed for rhinitis.   . furosemide (LASIX) 40 MG tablet Take 40 mg by mouth daily as needed for fluid.   Marland Kitchen lisinopril (PRINIVIL,ZESTRIL) 10 MG tablet TK 1 T PO QD  . LORazepam (ATIVAN) 0.5 MG tablet Take 0.5 mg by mouth at bedtime as needed for anxiety (or sleep).  . menthol-cetylpyridinium (CEPACOL) 3 MG lozenge Take 1 lozenge (3 mg total) by mouth as needed for sore throat.  . metoprolol tartrate (LOPRESSOR) 25 MG tablet Take 0.5 tablets (12.5 mg total) by mouth every evening.  . pantoprazole (PROTONIX) 40 MG tablet Take 1 tablet (40 mg total) by mouth daily.  . rosuvastatin (CRESTOR) 20 MG tablet Take 1 tablet (20 mg total) by mouth daily.  . Triamcinolone Acetonide (TRIAMCINOLONE 0.1 % CREAM : EUCERIN) CREA Apply 1 application topically 3 (three) times daily.  Marland Kitchen venlafaxine XR (EFFEXOR-XR) 150 MG 24 hr capsule TAKE 1 CAPSULE BY MOUTH DAILY  WITH BREAKFAST  . venlafaxine XR (EFFEXOR-XR) 37.5 MG 24 hr capsule Take 1 capsule (37.5 mg total) by mouth daily.  . [DISCONTINUED] ezetimibe (ZETIA) 10 MG tablet Take 1 tablet (10 mg total) by mouth daily.  . [DISCONTINUED] metoprolol tartrate (LOPRESSOR) 25 MG tablet Take 0.5 tablets (12.5 mg total) by mouth 2 (two) times daily.     Allergies:   Ivp dye [iodinated diagnostic agents] and Sulfa antibiotics   Social History   Tobacco Use  . Smoking status: Former Smoker    Packs/day: 1.00    Years: 20.00    Pack years: 20.00    Start date: 06/09/1992  . Smokeless tobacco: Never Used  . Tobacco comment: quit at age 52  Substance Use Topics  . Alcohol use: Yes    Alcohol/week: 5.0 standard drinks    Types: 5 Glasses of wine per week    Comment: nightly  .  Drug use: No    Comment: quit 24 years ago     Family Hx: The patient's family history includes ALS in her sister; Breast cancer in her maternal aunt, mother, and other; Breast cancer (age of onset: 16) in her maternal grandmother; Heart attack in her father, maternal grandfather, and paternal grandfather; Heart disease in her father.  ROS:   Please see the history of present illness.    Review of Systems  Cardiovascular: Positive for palpitations.  Respiratory: Positive for cough.   Hematologic/Lymphatic: Bruises/bleeds easily.  Neurological: Positive for dizziness and loss of balance.  Psychiatric/Behavioral: Positive for depression.   All other systems reviewed and are negative.   EKGs/Labs/Other Test Reviewed:    EKG:  EKG is   ordered today.  The ekg ordered today demonstrates normal sinus rhythm, heart rate 62, normal axis, no ST-T wave changes, QTC 397  Recent Labs: 12/06/2017: Hemoglobin 12.7; Platelet Count 155 01/20/2018: BUN 18; Creatinine, Ser 1.07; Potassium 4.5; Sodium 143 04/29/2018: ALT 39   Recent Lipid Panel Lab Results  Component Value Date/Time   CHOL 127 04/29/2018 11:29 AM   TRIG 107 04/29/2018  11:29 AM   HDL 64 04/29/2018 11:29 AM   CHOLHDL 2.0 04/29/2018 11:29 AM   LDLCALC 42 04/29/2018 11:29 AM    Physical Exam:    VS:  BP 138/74   Pulse 62   Ht 5' 2"  (1.575 m)   Wt 146 lb (66.2 kg)   BMI 26.70 kg/m     Wt Readings from Last 3 Encounters:  05/02/18 146 lb (66.2 kg)  03/09/18 141 lb 8.6 oz (64.2 kg)  03/07/18 142 lb (64.4 kg)     Physical Exam  Constitutional: She is oriented to person, place, and time. She appears well-developed and well-nourished. No distress.  HENT:  Head: Normocephalic and atraumatic.  Eyes: No scleral icterus.  Neck: No JVD present. No thyromegaly present.  Cardiovascular: Normal rate and regular rhythm.  No murmur heard. Pulmonary/Chest: Effort normal. She has no wheezes. She has no rales.  Abdominal: Soft. She exhibits no distension.  Musculoskeletal: She exhibits no edema.  Lymphadenopathy:    She has no cervical adenopathy.  Neurological: She is alert and oriented to person, place, and time.  Skin: Skin is warm and dry.  Psychiatric: She has a normal mood and affect.    ASSESSMENT & PLAN:    Coronary artery disease involving native coronary artery of native heart without angina pectoris History of MI in January 2019 while in [place] treated with drug-eluting stent to the LAD x2.  She has continued to have issues with fatigue since her PCI.  She typically likes to play golf but has not been able to do more than 9 holes since her event.  Some of her symptoms of fatigue have improved over time with reduction in her beta-blocker.  We discussed discontinuing this altogether.  She prefers to hold off on doing this for now.  She is willing to try taking metoprolol tartrate once daily.  She did have a nuclear stress test in [place] in March 2019.  She had submaximal exercise but normal perfusion.  If she continues to have fatigue, we may need to consider stress testing.  -Decrease metoprolol tartrate to 12.5 mg once daily  -Continue aspirin,  Plavix, Zetia, Crestor, lisinopril.   -Follow-up with Dr. Burt [name] in 06/2018  Essential hypertension Borderline control.  Continue to monitor for now.  Hyperlipidemia, unspecified hyperlipidemia type Continue ezetimibe, rosuvastatin.  Palpitations She had  an episode of rapid palpitations recently.  This occurred in the extreme heat.  Continue observation for now.  If she has recurrence, consider event monitor.   Dispo:  Return in about 2 months (around 07/02/2018) for Routine Follow Up, w/ Dr. Burt [name].   Medication Adjustments/Labs and Tests Ordered: Current medicines are reviewed at length with the patient today.  Concerns regarding medicines are outlined above.  Tests Ordered: No orders of the defined types were placed in this encounter.  Medication Changes: Meds ordered this encounter  Medications  . metoprolol tartrate (LOPRESSOR) 25 MG tablet    Sig: Take 0.5 tablets (12.5 mg total) by mouth every evening.    Dispense:  30 tablet    Refill:  3    Patient will call to fill  . ezetimibe (ZETIA) 10 MG tablet    Sig: Take 1 tablet (10 mg total) by mouth daily.    Dispense:  90 tablet    Refill:  3    Signed, Richardson Dopp, PA-C  05/02/2018 4:34 PM    [facility] [address], [place], [place]  [address] Phone: [phone]; Fax: [phone]

## 2018-05-02 NOTE — Patient Instructions (Addendum)
Medication Instructions:   START TAKING METOPROLOL  12.5 MG ONCE A DAY  IN THE PM   If you need a refill on your cardiac medications before your next appointment, please call your pharmacy.  Labwork: NONE ORDERED  TODAY    Testing/Procedures: NONE ORDERED  TODAY     Follow-Up: WITH DR Burt Knack IN November    Any Other Special Instructions Will Be Listed Below (If Applicable).

## 2018-05-03 NOTE — Addendum Note (Signed)
Addended by: Claude Manges on: 05/03/2018 10:56 AM   Modules accepted: Orders

## 2018-05-04 ENCOUNTER — Encounter (HOSPITAL_COMMUNITY): Payer: Self-pay | Attending: Cardiovascular Disease

## 2018-05-04 NOTE — Telephone Encounter (Signed)
Patient has been scheduled 11/18 at 0800.

## 2018-05-05 ENCOUNTER — Other Ambulatory Visit: Payer: Self-pay | Admitting: Oncology

## 2018-05-05 DIAGNOSIS — C50411 Malignant neoplasm of upper-outer quadrant of right female breast: Secondary | ICD-10-CM

## 2018-05-06 ENCOUNTER — Encounter (HOSPITAL_COMMUNITY): Payer: Self-pay

## 2018-05-11 ENCOUNTER — Telehealth (HOSPITAL_COMMUNITY): Payer: Self-pay | Admitting: Internal Medicine

## 2018-05-11 ENCOUNTER — Encounter (HOSPITAL_COMMUNITY): Payer: Self-pay

## 2018-05-13 ENCOUNTER — Encounter (HOSPITAL_COMMUNITY): Payer: Self-pay

## 2018-05-14 DIAGNOSIS — R42 Dizziness and giddiness: Secondary | ICD-10-CM | POA: Diagnosis not present

## 2018-05-14 DIAGNOSIS — Z7902 Long term (current) use of antithrombotics/antiplatelets: Secondary | ICD-10-CM | POA: Diagnosis not present

## 2018-05-14 DIAGNOSIS — R410 Disorientation, unspecified: Secondary | ICD-10-CM | POA: Diagnosis not present

## 2018-05-14 DIAGNOSIS — I472 Ventricular tachycardia: Secondary | ICD-10-CM | POA: Diagnosis not present

## 2018-05-14 DIAGNOSIS — E785 Hyperlipidemia, unspecified: Secondary | ICD-10-CM | POA: Diagnosis not present

## 2018-05-14 DIAGNOSIS — K219 Gastro-esophageal reflux disease without esophagitis: Secondary | ICD-10-CM | POA: Diagnosis not present

## 2018-05-14 DIAGNOSIS — Z853 Personal history of malignant neoplasm of breast: Secondary | ICD-10-CM | POA: Diagnosis not present

## 2018-05-14 DIAGNOSIS — E860 Dehydration: Secondary | ICD-10-CM | POA: Diagnosis not present

## 2018-05-14 DIAGNOSIS — R55 Syncope and collapse: Secondary | ICD-10-CM | POA: Diagnosis not present

## 2018-05-14 DIAGNOSIS — I2510 Atherosclerotic heart disease of native coronary artery without angina pectoris: Secondary | ICD-10-CM | POA: Diagnosis not present

## 2018-05-14 DIAGNOSIS — Z955 Presence of coronary angioplasty implant and graft: Secondary | ICD-10-CM | POA: Diagnosis not present

## 2018-05-14 DIAGNOSIS — I700 Atherosclerosis of aorta: Secondary | ICD-10-CM | POA: Diagnosis not present

## 2018-05-14 DIAGNOSIS — I252 Old myocardial infarction: Secondary | ICD-10-CM | POA: Diagnosis not present

## 2018-05-14 DIAGNOSIS — Z87891 Personal history of nicotine dependence: Secondary | ICD-10-CM | POA: Diagnosis not present

## 2018-05-18 ENCOUNTER — Encounter (HOSPITAL_COMMUNITY)
Admission: RE | Admit: 2018-05-18 | Discharge: 2018-05-18 | Disposition: A | Discharge status: Home / Self Care | Payer: Self-pay | Source: Ambulatory Visit | Attending: Cardiovascular Disease | Admitting: Cardiovascular Disease

## 2018-05-18 DIAGNOSIS — I252 Old myocardial infarction: Secondary | ICD-10-CM | POA: Insufficient documentation

## 2018-05-20 ENCOUNTER — Encounter (HOSPITAL_COMMUNITY)
Admission: RE | Admit: 2018-05-20 | Discharge: 2018-05-20 | Disposition: A | Discharge status: Home / Self Care | Payer: Self-pay | Source: Ambulatory Visit | Attending: Cardiovascular Disease | Admitting: Cardiovascular Disease

## 2018-05-24 DIAGNOSIS — N390 Urinary tract infection, site not specified: Secondary | ICD-10-CM | POA: Diagnosis not present

## 2018-05-25 ENCOUNTER — Encounter (HOSPITAL_COMMUNITY)
Admission: RE | Admit: 2018-05-25 | Discharge: 2018-05-25 | Disposition: A | Discharge status: Home / Self Care | Payer: Self-pay | Source: Ambulatory Visit | Attending: Cardiovascular Disease | Admitting: Cardiovascular Disease

## 2018-05-27 ENCOUNTER — Encounter (HOSPITAL_COMMUNITY)
Admission: RE | Admit: 2018-05-27 | Discharge: 2018-05-27 | Disposition: A | Discharge status: Home / Self Care | Payer: Self-pay | Source: Ambulatory Visit | Attending: Cardiovascular Disease | Admitting: Cardiovascular Disease

## 2018-05-28 ENCOUNTER — Encounter: Payer: Self-pay | Admitting: Neurology

## 2018-05-30 ENCOUNTER — Encounter: Payer: Self-pay | Admitting: Neurology

## 2018-05-30 ENCOUNTER — Ambulatory Visit (INDEPENDENT_AMBULATORY_CARE_PROVIDER_SITE_OTHER): Payer: Medicare Other | Admitting: Neurology

## 2018-05-30 VITALS — BP 107/71 | HR 77 | Ht 62.0 in | Wt 145.0 lb

## 2018-05-30 DIAGNOSIS — G478 Other sleep disorders: Secondary | ICD-10-CM | POA: Diagnosis not present

## 2018-05-30 DIAGNOSIS — Z9989 Dependence on other enabling machines and devices: Secondary | ICD-10-CM

## 2018-05-30 DIAGNOSIS — I2510 Atherosclerotic heart disease of native coronary artery without angina pectoris: Secondary | ICD-10-CM | POA: Diagnosis not present

## 2018-05-30 DIAGNOSIS — G4733 Obstructive sleep apnea (adult) (pediatric): Secondary | ICD-10-CM

## 2018-05-30 DIAGNOSIS — G4731 Primary central sleep apnea: Secondary | ICD-10-CM

## 2018-05-30 NOTE — Progress Notes (Signed)
SLEEP MEDICINE CLINIC   Provider:  Larey Seat, M D  Referring Provider: [name], MD Primary Care Physician:  [name], MD  Chief Complaint  Patient presents with  . Follow-up    pt alone, rm 11. pt states that she would like to discuss getting a different mask for her machine. DME [facility].    HPI:  RUWEYDA MACKNIGHT is a 72 y.o. female , seen here as a referral from Dr. Ardeth Perfect for a transfer of sleep care.  Chief complaint according to patient : " re evaluate my apnea and care."  The patient reports that she moved to the [place] area about 3 years ago and still gets supplies for her established CPAP per Mail. She was diagnosed with obstructive sleep apnea in [place], in [place]. The [facility] posted the sleep center. The patient was diagnosed on 05/02/2008 the study revealed an AHI of 23 was mild decreases in oxygen level as well as continuous snoring. Then CPAP was used for the second night of the split night polysomnography and her oxygen level improved to normal her snoring resolved and the breathing was totally regulated according to Dr. Shelton Silvas note. Concerning was that there was a ventricular tachycardia as of 27 beats. The patient was evaluated by cardiology afterwards the finding could never be reciprocated. She continued to use CPAP for the last 7 years compliantly but she is a little tired of using a nasal mask that has become uncomfortable and his pressure marks on her face. She is also not sure to what degree apnea still present at this time. The patient stays about 7 months of the year here in [place], and 5 months in [place]. Usually they travel from Thanksgiving to Easter. Her husband witnesses snoring when she naps, but not while on CPAP. Her insomnia improved drastically after CPAP.   Sleep habits are as follows: The patient goes to bed between 9.30 and 10 PM, usually falls asleep promptly. She prefers the left side to sleep  , but wakes sometimes up on her back.  She learned to sleep on her back after breast cancer surgery ( diagnosed Aug 8th 2014 ). The bedroom is described as core, quiet and dark. The patient shares a bedroom with her husband. They're also 2 dogs in the bedroom on the bed. She rises usually once to go to the bathroom sometimes twice. She can fall asleep again fairly promptly. She is not a restless sleeper not kicking or moving or not excessively at night. She rises in the morning at 5 sometimes even 8. She usually wakes spontaneously not based on an alarm setting. She averages 7-8 hours of nocturnal sleep.  She reports vivid dreams, "repetitive and in full color".  Some mornings she feels refreshed and restored but not all the time. She is still recovering from her breast reconstruction surgery. She endorsed some snoring, joint pain, aching muscles and even some dizziness. She also has right shoulder bursitis which limits her ability to sleep on the right. She naps 3 out of 7 days, once  daily, a nap will last  2 hours unless she sets an alarm.   Mrs. Neuwirth this past medical history includes gout, hyperlipidemia, hypertension, palpitations probably due to nonsustained ventricular tachycardia. In addition breast cancer of which her mother maternal aunt maternal grandmother and maternal great-grandmother were also affected. She is a history of allergic rhinitis her sister died in 2011/10/31 of ALS. Sleep medical history and family sleep history: Her  father was diagnosed with OSA never used CPAP ,  had an MI at age 46. Social history: non smoker, quit at age 38. ETOH , 1-2 at night. Caffeine use : none. Decaffeinated tea and coffee.  Average and regular golf player. She likes winter sports, especially skiing but uses the left dangerous slope these days.  Interval history from 05-20-15. Hearing has been willing to undergo a new sleep study and attended nocturnal polysomnography on 04-09-15. She was diagnosed with a very  mild AHI of 5.2 , supine sleep  AHI became 12.4  and it was further evident that the patient did not get into REM sleep. For this reason I feel it is much more important that the patient stays on CPAP given that her apnea was mild and she was fitted with a new mask. The new mass does not cause pressure marks around her nose and does not entangle her hair. She is using a dream wear interface, which [facility] had to explicitly order for her.  Interval history from 12/23/2015. Mrs. Freundlich has happily returned from [place], I have the pleasure today to see her CPAP download which confirms and 96.7% compliance average user time 6 hours and 39 minutes of CPAP.The device is a C flex set at 8 cm water pressure with a ramp time of 30 minutes. The REM time is a little too long and we will reduce it to 10 minutes. Her husband has made her aware that she still snores when she doesn't use a CPAP. She has become more compliant she endorsed only 2 points today on the geriatric depression store, 7 points on the Epworth sleepiness score and the fatigue severity at 29.  Interval history from 02/26/2016. [name] is here today relaxed after a week vacation. She reports sleeping so much better with the new CPAP machine. Her sleep is deeper but she does not recall having nightmares she has fallen out of bed 1 night it was actually the third night she used the new CPAP. She feels her sleep is less fragmented, more sound, and more refreshing and restorative. Her download revealed 100% compliance over the last 30 days was 97% compliance 4 hours of use, average user time is 7 hours and 41 minutes. This is excellent compliance with CPAP is set at 8 cm water we did  use a 10 minute ramp function. Her snoring has of course been alleviated using CPAP, which pleases her husband. She has a residual AHI of 8.1 but given her decrease and sleepiness/ fatigue I would not want to change the settings.  Interval history from 12/21/2016,  [name] has been to winter in [place] and had a additional medication in [place]. She is now doing well but during her winter stay in [place] she developed shortness of breath and apparently more severe hypoxemia and apnea. She saw a pulmonologist at the location would tested her oxygen levels while walking and also diagnosed her with type a flu. She had been coughing for over 6 weeks when the flu finally converted to bronchitis and then resolved. She remained air hungry and short of breath for much of her winter time. I'm able to state today that she feels back to her baseline. But we need to discuss how we could prevent altitude related apneas which can often be central in nature and if she should use an AutoPap with an automatic pressure window. The patient is a very highly compliant CPAP user her compliance rate was 93.3% with an average  user time of 7 hours and 6 minutes. She has some air leaks but not significant once CPAP is set at 8 cm water pressure with a residual AHI of 17.4. She has a short ramp time. Most of the AHI is related to shallow breathing. Her average central apnea index is 1.1 and obstructive apnea index is 3.3. Our goal is is to have an AHI of below 5. I will ask [facility] to change her to an auto titration from 5-10 cm water. She continues to use a dream wear interface. She would like the non-prong version of the dream wear interface. We will also meet again in late September or October to discuss if she needs additional oxygen at night during her stays at high altitudes.  05-24-2017, Mrs. Salvadore Dom is planning again to spend the winter in [place]. She asked me to make sure that she will have oxygen available for nightly use in the upper inner air. Last year she stated she was miserable on CPAP alone. She is a very compliant CPAP use of his 100% compliance and an average user time of 9 hours and 2 minutes, uses the machine as an auto CPAP between 5 and 15 cm water, average  pressure is 9.9 cm water and the average residual AHI is 5.1. The machine allows between 5 and 10 cm water pressure only, given that the patient does not have central apneas I will increase the upper limits to 12 cm water. I would also investigate is able care can help [place] with oxygen in [place] ( she knows an oxygen supplier in [place]- "[employer]" ) .   05-30-2018, The patient suffered a heart attack in January of this year, and had a rough year- needs bilateral knee replacements.  Her step- son died 58 days ago at age 12 years , with alcoholism and bipolar disease, died of multi organ -liver failure.  She brought her CPAP machine - and would like to change her interface .she wants to return to a pillairo or nasal pillow. Her machine was issued in  August 2016- after a AHI of only 5.2- but loud snoring and RERAS. UARS.  Residual AHI was 7/h at 90% pressure of 10.2 cm water.  Compliance was 87% for time, and 26/ 30 days.      Review of Systems: Out of a complete 14 system review, the patient complains of only the following symptoms, and all other reviewed systems are negative. Snoring.  Grieving - lost mother and sister to breast cancer in a short time and was diagnosed with breast cancer herself 3.5 years ago.  On antihormone therapy. She takes statins.  Epworth score 9/24  , Fatigue severity score again at  41/ 63, geriatric depression score 2 ,  her machine is 73 years old . Nasal pillow- dream wear causes hair trouble- will switch back to a nasal pillow.  Settings are AUTO on a dream station, settings are at 5-12 with 2 cm water EPR.    Social History   Socioeconomic History  . Marital status: Married    Spouse name: Not on file  . Number of children: 2  . Years of education: Not on file  . Highest education level: Not on file  Occupational History  . Not on file  Social Needs  . Financial resource strain: Not on file  . Food insecurity:    Worry: Not on file     Inability: Not on file  . Transportation needs:  Medical: Not on file    Non-medical: Not on file  Tobacco Use  . Smoking status: Former Smoker    Packs/day: 1.00    Years: 20.00    Pack years: 20.00    Start date: 06/09/1992  . Smokeless tobacco: Never Used  . Tobacco comment: quit at age 19  Substance and Sexual Activity  . Alcohol use: Yes    Alcohol/week: 5.0 standard drinks    Types: 5 Glasses of wine per week    Comment: nightly  . Drug use: No    Comment: quit 24 years ago  . Sexual activity: Yes    Birth control/protection: Surgical  Lifestyle  . Physical activity:    Days per week: Not on file    Minutes per session: Not on file  . Stress: Not on file  Relationships  . Social connections:    Talks on phone: Not on file    Gets together: Not on file    Attends religious service: Not on file    Active member of club or organization: Not on file    Attends meetings of clubs or organizations: Not on file    Relationship status: Not on file  . Intimate partner violence:    Fear of current or ex partner: Not on file    Emotionally abused: Not on file    Physically abused: Not on file    Forced sexual activity: Not on file  Other Topics Concern  . Not on file  Social History Narrative  . Not on file    Family History  Problem Relation Age of Onset  . Breast cancer Mother        possible inflammatory breast cancer  . Heart attack Father   . Heart disease Father   . ALS Sister   . Breast cancer Maternal Grandmother 65  . Heart attack Maternal Grandfather   . Heart attack Paternal Grandfather   . Breast cancer Maternal Aunt        dx in her 67s  . Breast cancer Other        maternal great grandmother; dx in her 89s    Past Medical History:  Diagnosis Date  . Allergy   . Anxiety    takes Ativan daily prn  . Arthritis   . Breast cancer ([place]) 2014   ER+/PR+/HEr2-,   . Bruises easily   . Colon polyps    2 polyps by report  . Diverticulosis   .  GERD (gastroesophageal reflux disease)    occasionally takes Nexium   . Gout    takes Allopurinol daily and Colchicine daily prn;last attack 75yr ago  . Gout   . History of bladder infections    many yrs ago  . History of bronchitis    last time at least 89yrago  . History of stress incontinence   . Hyperlipidemia    takes Pravastatin daily  . Insomnia    takes Ambien nightly prn  . OSA (obstructive sleep apnea)    on CPAP  . Osteoarthritis   . Peripheral edema    takes Furosemide daily prn  . PONV (postoperative nausea and vomiting)   . Postmenopausal hormone therapy   . Radiation 07/27/13-09/07/13   Right Breast x 31 treatments  . Rhinitis    uses Flonase prn  . Sinus congestion   . Status post breast reconstruction 10/15/14   Bilateral [place] and [place]P [place]  . Tachycardia    takes  Metoprolol daily  . Ventricular tachycardia, non-sustained ([place])    during sleep study 2009 with normal cardiac workup    Past Surgical History:  Procedure Laterality Date  . ABDOMINAL HYSTERECTOMY    . APPENDECTOMY    . AXILLARY SENTINEL NODE BIOPSY Right 05/23/2013   Procedure: AXILLARY SENTINEL NODE BIOPSY;  Surgeon: [name], MD;  Location: [facility];  Service: General;  Laterality: Right;  nuc med injection 7:00  . bilateral cataract surgery    . BREAST RECONSTRUCTION Bilateral 05/17/2014   W  . BREAST RECONSTRUCTION WITH PLACEMENT OF TISSUE EXPANDER AND FLEX HD (ACELLULAR HYDRATED DERMIS) Bilateral 05/23/2013   Procedure: BILATERAL BREAST RECONSTRUCTION WITH PLACEMENT OF TISSUE EXPANDER AND FLEX HD;  Surgeon: [name], MD;  Location: [facility];  Service: Plastics;  Laterality: Bilateral;  . CARDIAC CATHETERIZATION  1999  . CHOLECYSTECTOMY    . COLONOSCOPY    . COSMETIC SURGERY    . GALLBLADDER SURGERY    . REMOVAL OF BILATERAL TISSUE EXPANDERS WITH PLACEMENT OF BILATERAL BREAST IMPLANTS Bilateral 05/17/2014   Procedure: REMOVAL OF BILATERAL TISSUE  EXPANDERS WITH PLACEMENT OF BILATERAL BREAST IMPLANTS FOR RECONSTRUCTION;  Surgeon: [name], MD;  Location: [facility];  Service: Plastics;  Laterality: Bilateral;  . TONSILLECTOMY    . TOTAL MASTECTOMY Bilateral 05/23/2013   WITH RECONSTRUCTION  . TOTAL MASTECTOMY Bilateral 05/23/2013   Procedure: TOTAL MASTECTOMY;  Surgeon: [name], MD;  Location: [facility];  Service: General;  Laterality: Bilateral;  . urinary tightening     d/t urinary incontinence    Current Outpatient Medications  Medication Sig Dispense Refill  . allopurinol (ZYLOPRIM) 100 MG tablet TAKE 1 TABLET BY MOUTH ONCE DAILY 90 tablet 0  . ASPIRIN LOW DOSE 81 MG chewable tablet Chew 81 mg by mouth daily.   0  . cholecalciferol 5000 UNITS TABS Take 0.2 tablets (1,000 Units total) by mouth daily. 100 tablet 3  . clopidogrel (PLAVIX) 75 MG tablet TK 1 T PO QD  TK 300 MG 4 T PO 1 TIME ON FIRST DAY THEN 75 MG QD  0  . colchicine 0.6 MG tablet TAKE 1 TABLET BY MOUTH EVERY DAY OR AS NEEDED 30 tablet 0  . Cyanocobalamin (VITAMIN B-12 IJ) Inject 1,000 mcg as directed every 30 (thirty) days. Normally 1st of the month    . cyclobenzaprine (FLEXERIL) 10 MG tablet TK 1 T PO Q 8 H PRF MUSCLE SPASMS  0  . exemestane (AROMASIN) 25 MG tablet Take 1 tablet (25 mg total) by mouth daily after breakfast. 90 tablet 4  . ezetimibe (ZETIA) 10 MG tablet Take 1 tablet (10 mg total) by mouth daily. 90 tablet 3  . fluticasone (FLONASE) 50 MCG/ACT nasal spray Place 1 spray into the nose daily as needed for rhinitis.     . furosemide (LASIX) 40 MG tablet Take 40 mg by mouth daily as needed for fluid.     Marland Kitchen lisinopril (PRINIVIL,ZESTRIL) 10 MG tablet TK 1 T PO QD  11  . LORazepam (ATIVAN) 0.5 MG tablet Take 0.5 mg by mouth at bedtime as needed for anxiety (or sleep).    . menthol-cetylpyridinium (CEPACOL) 3 MG lozenge Take 1 lozenge (3 mg total) by mouth as needed for sore throat. 100 tablet 12  . metoprolol tartrate (LOPRESSOR) 25 MG tablet Take 0.5  tablets (12.5 mg total) by mouth every evening. 30 tablet 3  . pantoprazole (PROTONIX) 40 MG tablet Take 1 tablet (40 mg total) by mouth daily. [place]  tablet 3  . rosuvastatin (CRESTOR) 20 MG tablet Take 1 tablet (20 mg total) by mouth daily. 30 tablet 11  . Triamcinolone Acetonide (TRIAMCINOLONE 0.1 % CREAM : EUCERIN) CREA Apply 1 application topically 3 (three) times daily. 1 each 1  . venlafaxine XR (EFFEXOR-XR) 150 MG 24 hr capsule TAKE 1 CAPSULE BY MOUTH DAILY WITH BREAKFAST 90 capsule 0  . venlafaxine XR (EFFEXOR-XR) 37.5 MG 24 hr capsule TAKE 1 CAPSULE(37.5 MG) BY MOUTH DAILY 90 capsule 0   No current facility-administered medications for this visit.     Allergies as of 05/30/2018 - Review Complete 05/30/2018  Allergen Reaction Noted  . Ivp dye [iodinated diagnostic agents] Hives 06/09/2012  . Sulfa antibiotics Swelling 06/09/2012    Vitals: BP 107/71   Pulse 77   Ht 5' 2"  (1.575 m)   Wt 145 lb (65.8 kg)   BMI 26.52 kg/m  Last Weight:  Wt Readings from Last 1 Encounters:  05/30/18 145 lb (65.8 kg)   ZOX:WRUE mass index is 26.52 kg/m.     Last Height:   Ht Readings from Last 1 Encounters:  05/30/18 5' 2"  (1.575 m)    Physical exam:  General: The patient is awake, alert and appears not in acute distress. The patient is well groomed. She reports sweating a lot.  Head: Normocephalic, atraumatic. Neck is supple. Mallampati 4,  neck circumference: 16.25  Nasal airflow unrestricted, TMJ is not evident. Retrognathia is not seen. She has a crowded lower jar. Cardiovascular:  Regular rate and rhythm , without  murmurs or carotid bruit, and without distended neck veins. Respiratory: Lungs are clear to auscultation.Skin:  Without evidence of edema, or rashTrunk: BMI is 28. The patient's posture is erect.   Neurologic exam :Speech is fluent,  without  dysarthria, dysphonia or aphasia. Mood and affect are appropriate.  Cranial nerves:Pupils are equal and briskly reactive to light.  Extraocular movement are intact. Hearing to finger rub intact. Facial sensation intact to fine touch.Facial motor strength is symmetric and tongue and uvula moves midline. Shoulder shrug was symmetrical. She is very tense over the shoulder area. The patient was advised of the nature of the diagnosed sleep disorder , the treatment options and risks for general a health and wellness arising from not treating the condition.   I spent more than 25 minutes of face to face time with the patient. Greater than 50% of time was spent in counseling and coordination of care. We have discussed the diagnosis and differential and I answered the patient's questions.   She would like to have oxygen in [place]. I will  need to change her settings to a max pressure of  13 cm , only with nasal pillow interface.   She is not longer excessively fatigued or sleepy and when she uses her machine, she doesn't snore- which was confirmed by her husband. She does not have trouble sleeping and she does not have trouble tolerating higher pressures. She will continue to use CPAP now to Auto 5-13 cm water and will need 02 during her snow birding in [place], [place]  and return in 12 month.  My Assessment is that of a patient with oxygen need when travelling to her winter home in [place]. She was diagnosed with OSA in [place] , in 2009.    Asencion Partridge Oluwadamilare Tobler MD  05/30/2018   CC: [name], [address] [place], [place] [address]

## 2018-06-01 ENCOUNTER — Encounter (HOSPITAL_COMMUNITY): Payer: Self-pay

## 2018-06-01 DIAGNOSIS — Z6826 Body mass index (BMI) 26.0-26.9, adult: Secondary | ICD-10-CM | POA: Diagnosis not present

## 2018-06-01 DIAGNOSIS — M25561 Pain in right knee: Secondary | ICD-10-CM | POA: Diagnosis not present

## 2018-06-01 DIAGNOSIS — M25562 Pain in left knee: Secondary | ICD-10-CM | POA: Diagnosis not present

## 2018-06-01 DIAGNOSIS — M1712 Unilateral primary osteoarthritis, left knee: Secondary | ICD-10-CM | POA: Diagnosis not present

## 2018-06-01 DIAGNOSIS — R509 Fever, unspecified: Secondary | ICD-10-CM | POA: Diagnosis not present

## 2018-06-01 DIAGNOSIS — J029 Acute pharyngitis, unspecified: Secondary | ICD-10-CM | POA: Diagnosis not present

## 2018-06-01 DIAGNOSIS — M238X1 Other internal derangements of right knee: Secondary | ICD-10-CM | POA: Diagnosis not present

## 2018-06-01 DIAGNOSIS — R05 Cough: Secondary | ICD-10-CM | POA: Diagnosis not present

## 2018-06-03 ENCOUNTER — Encounter (HOSPITAL_COMMUNITY): Payer: Self-pay

## 2018-06-08 ENCOUNTER — Encounter (HOSPITAL_COMMUNITY)
Admission: RE | Admit: 2018-06-08 | Discharge: 2018-06-08 | Disposition: A | Discharge status: Home / Self Care | Payer: Self-pay | Source: Ambulatory Visit | Attending: Cardiovascular Disease | Admitting: Cardiovascular Disease

## 2018-06-10 ENCOUNTER — Telehealth: Payer: Self-pay | Admitting: Cardiovascular Disease

## 2018-06-10 ENCOUNTER — Encounter (HOSPITAL_COMMUNITY)
Admission: RE | Admit: 2018-06-10 | Discharge: 2018-06-10 | Disposition: A | Discharge status: Home / Self Care | Payer: Self-pay | Source: Ambulatory Visit | Attending: Cardiovascular Disease | Admitting: Cardiovascular Disease

## 2018-06-10 NOTE — Telephone Encounter (Signed)
Walk In Pt Form-pt has questions about upcoming Knee surgery. Placed in Dr.Coopers doc box.

## 2018-06-11 DIAGNOSIS — M25561 Pain in right knee: Secondary | ICD-10-CM | POA: Diagnosis not present

## 2018-06-15 ENCOUNTER — Encounter (HOSPITAL_COMMUNITY)
Admission: RE | Admit: 2018-06-15 | Discharge: 2018-06-15 | Disposition: A | Discharge status: Home / Self Care | Payer: Self-pay | Source: Ambulatory Visit | Attending: Cardiovascular Disease | Admitting: Cardiovascular Disease

## 2018-06-15 DIAGNOSIS — M1711 Unilateral primary osteoarthritis, right knee: Secondary | ICD-10-CM | POA: Diagnosis not present

## 2018-06-15 DIAGNOSIS — M25562 Pain in left knee: Secondary | ICD-10-CM | POA: Diagnosis not present

## 2018-06-15 DIAGNOSIS — M25561 Pain in right knee: Secondary | ICD-10-CM | POA: Diagnosis not present

## 2018-06-16 ENCOUNTER — Telehealth: Payer: Self-pay | Admitting: Cardiovascular Disease

## 2018-06-16 DIAGNOSIS — R3129 Other microscopic hematuria: Secondary | ICD-10-CM | POA: Diagnosis not present

## 2018-06-16 DIAGNOSIS — N390 Urinary tract infection, site not specified: Secondary | ICD-10-CM | POA: Diagnosis not present

## 2018-06-16 DIAGNOSIS — Z6826 Body mass index (BMI) 26.0-26.9, adult: Secondary | ICD-10-CM | POA: Diagnosis not present

## 2018-06-16 DIAGNOSIS — J019 Acute sinusitis, unspecified: Secondary | ICD-10-CM | POA: Diagnosis not present

## 2018-06-17 ENCOUNTER — Telehealth: Payer: Self-pay

## 2018-06-17 ENCOUNTER — Encounter (HOSPITAL_COMMUNITY): Payer: Self-pay

## 2018-06-17 DIAGNOSIS — I252 Old myocardial infarction: Secondary | ICD-10-CM | POA: Insufficient documentation

## 2018-06-17 NOTE — Telephone Encounter (Signed)
   [facility] Pre-operative Risk Assessment    Request for surgical clearance:  1. What type of surgery is being performed?  Right Total Knee   2. When is this surgery scheduled?  07/26/18   3. What type of clearance is required (medical clearance vs. Pharmacy clearance to hold med vs. Both)?  both  4. Are there any medications that need to be held prior to surgery and how long? Plavix/Aspirin 7 days   5. Practice name and name of physician performing surgery?  [facility]/ Dr Alvan Dame   6. What is your office phone number [phone]    7.   What is your office fax number [phone]  8.   Anesthesia type (None, local, MAC, general) ?  spinal   Tracey [name] 06/17/2018, 4:07 PM  _________________________________________________________________   (provider comments below)

## 2018-06-20 NOTE — Telephone Encounter (Signed)
Patient has an appointment on 07/04/2018 with Dr. Burt Knack.  Dr. Burt Knack please address surgical clearance and holding Plavix/aspirin for 7 days.  Preop call back please inform the requesting provider of patient's upcoming appointment.  I will remove this from the preop box.  Thanks Pecolia Ades, NP

## 2018-06-21 ENCOUNTER — Telehealth: Payer: Self-pay | Admitting: Cardiovascular Disease

## 2018-06-21 NOTE — Telephone Encounter (Signed)
New message:      Pt states we should be receiving a fax today from Dr. Alvan Dame for a clearance on this patient to do knee replacement surgery. Pt states she will be having this surgery on 07/26/18. Pt states if she needs to be seen please give her a call. She states to also let her know if we did not receive the fax.

## 2018-06-21 NOTE — Telephone Encounter (Signed)
Informed patient that the clearance from Dr. Aurea Graff office was received and at her appointment with Dr. Burt Knack on 07/04/18 she will be evaluated for clearance. Patient stated if she needs any testing she would like to have them done before 11/22 due to being out of town the last week of November. Will forward to Dr. Burt Knack to see if he wants any test ordered prior to appointment.

## 2018-06-22 ENCOUNTER — Encounter (HOSPITAL_COMMUNITY)
Admission: RE | Admit: 2018-06-22 | Discharge: 2018-06-22 | Disposition: A | Discharge status: Home / Self Care | Payer: Self-pay | Source: Ambulatory Visit | Attending: Cardiovascular Disease | Admitting: Cardiovascular Disease

## 2018-06-24 ENCOUNTER — Encounter (HOSPITAL_COMMUNITY): Payer: Self-pay

## 2018-06-24 NOTE — Telephone Encounter (Signed)
Pt with NSTEMI 09/16/2017 - per guidelines should continue uninterrupted DAPT x 12 months. Called and left message to discuss - she will call back.

## 2018-06-29 ENCOUNTER — Encounter (HOSPITAL_COMMUNITY)
Admission: RE | Admit: 2018-06-29 | Discharge: 2018-06-29 | Disposition: A | Discharge status: Home / Self Care | Payer: Self-pay | Source: Ambulatory Visit | Attending: Cardiovascular Disease | Admitting: Cardiovascular Disease

## 2018-07-01 ENCOUNTER — Encounter (HOSPITAL_COMMUNITY): Payer: Self-pay

## 2018-07-01 DIAGNOSIS — M25561 Pain in right knee: Secondary | ICD-10-CM | POA: Diagnosis not present

## 2018-07-04 ENCOUNTER — Encounter: Payer: Self-pay | Admitting: Cardiovascular Disease

## 2018-07-04 ENCOUNTER — Ambulatory Visit (INDEPENDENT_AMBULATORY_CARE_PROVIDER_SITE_OTHER): Payer: Medicare Other | Admitting: Cardiovascular Disease

## 2018-07-04 VITALS — BP 106/62 | HR 65 | Ht 62.0 in | Wt 147.4 lb

## 2018-07-04 DIAGNOSIS — I2510 Atherosclerotic heart disease of native coronary artery without angina pectoris: Secondary | ICD-10-CM

## 2018-07-04 DIAGNOSIS — E782 Mixed hyperlipidemia: Secondary | ICD-10-CM

## 2018-07-04 DIAGNOSIS — I10 Essential (primary) hypertension: Secondary | ICD-10-CM | POA: Diagnosis not present

## 2018-07-04 DIAGNOSIS — Z23 Encounter for immunization: Secondary | ICD-10-CM | POA: Diagnosis not present

## 2018-07-04 DIAGNOSIS — Z01810 Encounter for preprocedural cardiovascular examination: Secondary | ICD-10-CM | POA: Diagnosis not present

## 2018-07-04 MED ORDER — METOPROLOL SUCCINATE ER 25 MG PO TB24: 25.0000 mg | ORAL_TABLET | Freq: Every day | ORAL | 3 refills | Status: DC

## 2018-07-04 NOTE — Telephone Encounter (Signed)
Patient was seen in clinic today. See Dr. Antionette Char 11/18 OV note.

## 2018-07-04 NOTE — Progress Notes (Signed)
Cardiology Office Note:    Date:  07/04/2018   ID:  Tracey [name], DOB 25-Mar-1946, MRN [id number]  PCP:  [name], MD  Cardiologist:  Sherren Mocha, MD  Electrophysiologist:  None   Referring MD: [name], MD   Chief Complaint  Patient presents with  . Coronary Artery Disease   History of Present Illness:    Tracey [name] is a 72 y.o. female with a hx of coronary artery disease, presenting for preoperative evaluation today.  The patient presented with a non-STEMI in January 2019 and was treated with PCI of the LAD with overlapping drug-eluting stents.  She is done well since that time.  A stress nuclear scan in March 2019 demonstrated no ischemia.  She is participating in cardiac rehab and has no exertional angina or shortness of breath.  She specifically denies chest pain or pressure.  She has an upcoming knee replacement surgery scheduled.  She denies orthopnea, PND, or heart palpitations.  Past Medical History:  Diagnosis Date  . Allergy   . Anxiety    takes Ativan daily prn  . Arthritis   . Breast cancer ([place]) 2014   ER+/PR+/HEr2-,   . Bruises easily   . Colon polyps    2 polyps by report  . Diverticulosis   . GERD (gastroesophageal reflux disease)    occasionally takes Nexium   . Gout    takes Allopurinol daily and Colchicine daily prn;last attack 19yr ago  . Gout   . History of bladder infections    many yrs ago  . History of bronchitis    last time at least 858yrago  . History of stress incontinence   . Hyperlipidemia    takes Pravastatin daily  . Insomnia    takes Ambien nightly prn  . OSA (obstructive sleep apnea)    on CPAP  . Osteoarthritis   . Peripheral edema    takes Furosemide daily prn  . PONV (postoperative nausea and vomiting)   . Postmenopausal hormone therapy   . Radiation 07/27/13-09/07/13   Right Breast x 31 treatments  . Rhinitis    uses Flonase prn  . Sinus congestion   . Status post breast reconstruction 10/15/14   [place]  . Tachycardia    takes Metoprolol daily  . Ventricular tachycardia, non-sustained [place]   during sleep study 2009 with normal cardiac workup    Past Surgical History:  Procedure Laterality Date  . ABDOMINAL HYSTERECTOMY    . APPENDECTOMY    . AXILLARY SENTINEL NODE BIOPSY Right 05/23/2013   Procedure: AXILLARY SENTINEL NODE BIOPSY;  Surgeon: ToOdis HollingsheadMD;  [facility] Service: General;  Laterality: Right;  nuc med injection 7:00  . bilateral cataract surgery    . BREAST RECONSTRUCTION Bilateral 05/17/2014   W  . BREAST RECONSTRUCTION WITH PLACEMENT OF TISSUE EXPANDER AND FLEX HD (ACELLULAR HYDRATED DERMIS) Bilateral 05/23/2013   Procedure: BILATERAL BREAST RECONSTRUCTION WITH PLACEMENT OF TISSUE EXPANDER AND FLEX HD;  Surgeon: DaCrissie ReeseMD;  [place] [facility] Service: [place]astics;  Laterality: Bilateral;  . CARDIAC CATHETERIZATION  1999  . CHOLECYSTECTOMY    . COLONOSCOPY    . COSMETIC SURGERY    . GALLBLADDER SURGERY    . REMOVAL OF BILATERAL TISSUE EXPANDERS WITH PLACEMENT OF BILATERAL BREAST IMPLANTS Bilateral 05/17/2014   Procedure: REMOVAL OF BILATERAL TISSUE EXPANDERS WITH PLACEMENT OF BILATERAL BREAST IMPLANTS FOR RECONSTRUCTION;  Surgeon: DaCrissie ReeseMD;  Location: [facility];  Service: Plastics;  Laterality: Bilateral;  . TONSILLECTOMY    . TOTAL MASTECTOMY Bilateral 05/23/2013   WITH RECONSTRUCTION  . TOTAL MASTECTOMY Bilateral 05/23/2013   Procedure: TOTAL MASTECTOMY;  Surgeon: [name], MD;  Location: [facility];  Service: General;  Laterality: Bilateral;  . urinary tightening     d/t urinary incontinence    Current Medications: Current Meds  Medication Sig  . allopurinol (ZYLOPRIM) 100 MG tablet TAKE 1 TABLET BY MOUTH ONCE DAILY  . ASPIRIN LOW DOSE 81 MG chewable tablet Chew 81 mg by mouth daily.   . cholecalciferol 5000 UNITS TABS Take 0.2 tablets (1,000 Units total) by mouth daily.  .  clopidogrel (PLAVIX) 75 MG tablet TK 1 T PO QD  TK 300 MG 4 T PO 1 TIME ON FIRST DAY THEN 75 MG QD  . colchicine 0.6 MG tablet TAKE 1 TABLET BY MOUTH EVERY DAY OR AS NEEDED  . Cyanocobalamin (VITAMIN B-12 IJ) Inject 1,000 mcg as directed every 30 (thirty) days. Normally 1st of the month  . cyclobenzaprine (FLEXERIL) 10 MG tablet TK 1 T PO Q 8 H PRF MUSCLE SPASMS  . exemestane (AROMASIN) 25 MG tablet Take 1 tablet (25 mg total) by mouth daily after breakfast.  . ezetimibe (ZETIA) 10 MG tablet Take 1 tablet (10 mg total) by mouth daily.  . fluticasone (FLONASE) 50 MCG/ACT nasal spray Place 1 spray into the nose daily as needed for rhinitis.   . furosemide (LASIX) 40 MG tablet Take 40 mg by mouth daily as needed for fluid.   Marland Kitchen lisinopril (PRINIVIL,ZESTRIL) 10 MG tablet TK 1 T PO QD  . LORazepam (ATIVAN) 0.5 MG tablet Take 0.5 mg by mouth at bedtime as needed for anxiety (or sleep).  . menthol-cetylpyridinium (CEPACOL) 3 MG lozenge Take 1 lozenge (3 mg total) by mouth as needed for sore throat.  . pantoprazole (PROTONIX) 40 MG tablet Take 1 tablet (40 mg total) by mouth daily.  . rosuvastatin (CRESTOR) 20 MG tablet Take 1 tablet (20 mg total) by mouth daily.  . Triamcinolone Acetonide (TRIAMCINOLONE 0.1 % CREAM : EUCERIN) CREA Apply 1 application topically 3 (three) times daily.  Marland Kitchen venlafaxine XR (EFFEXOR-XR) 150 MG 24 hr capsule TAKE 1 CAPSULE BY MOUTH DAILY WITH BREAKFAST  . venlafaxine XR (EFFEXOR-XR) 37.5 MG 24 hr capsule TAKE 1 CAPSULE(37.5 MG) BY MOUTH DAILY  . [DISCONTINUED] metoprolol tartrate (LOPRESSOR) 25 MG tablet Take 0.5 tablets (12.5 mg total) by mouth every evening.     Allergies:   Ivp dye [iodinated diagnostic agents]; Sulfa antibiotics; and Tape   Social History   Socioeconomic History  . Marital status: Married    Spouse name: Not on file  . Number of children: 2  . Years of education: Not on file  . Highest education level: Not on file  Occupational History  . Not on  file  Social Needs  . Financial resource strain: Not on file  . Food insecurity:    Worry: Not on file    Inability: Not on file  . Transportation needs:    Medical: Not on file    Non-medical: Not on file  Tobacco Use  . Smoking status: Former Smoker    Packs/day: 1.00    Years: 20.00    Pack years: 20.00    Start date: 06/09/1992  . Smokeless tobacco: Never Used  . Tobacco comment: quit at age 32  Substance and Sexual Activity  . Alcohol use: Yes  Alcohol/week: 5.0 standard drinks    Types: 5 Glasses of wine per week    Comment: nightly  . Drug use: No    Comment: quit 24 years ago  . Sexual activity: Yes    Birth control/protection: Surgical  Lifestyle  . Physical activity:    Days per week: Not on file    Minutes per session: Not on file  . Stress: Not on file  Relationships  . Social connections:    Talks on phone: Not on file    Gets together: Not on file    Attends religious service: Not on file    Active member of club or organization: Not on file    Attends meetings of clubs or organizations: Not on file    Relationship status: Not on file  Other Topics Concern  . Not on file  Social History Narrative  . Not on file     Family History: The patient's family history includes ALS in her sister; Breast cancer in her maternal aunt, mother, and other; Breast cancer (age of onset: 70) in her maternal grandmother; Heart attack in her father, maternal grandfather, and paternal grandfather; Heart disease in her father.  ROS:   Please see the history of present illness.    Positive for knee pain, easy bruising.  All other systems reviewed and are negative.  EKGs/Labs/Other Studies Reviewed:    EKG:  EKG is not ordered today.   Recent Labs: 12/06/2017: Hemoglobin 12.7; Platelet Count 155 01/20/2018: BUN 18; Creatinine, Ser 1.07; Potassium 4.5; Sodium 143 04/29/2018: ALT 39  Recent Lipid Panel    Component Value Date/Time   CHOL 127 04/29/2018 1129   TRIG 107  04/29/2018 1129   HDL 64 04/29/2018 1129   CHOLHDL 2.0 04/29/2018 1129   LDLCALC 42 04/29/2018 1129    Physical Exam:    VS:  BP 106/62   Pulse 65   Ht 5' 2"  (1.575 m)   Wt 147 lb 6.4 oz (66.9 kg)   SpO2 98%   BMI 26.96 kg/m     Wt Readings from Last 3 Encounters:  07/04/18 147 lb 6.4 oz (66.9 kg)  05/30/18 145 lb (65.8 kg)  05/02/18 146 lb (66.2 kg)     GEN:  Well nourished, well developed in no acute distress HEENT: Normal NECK: No JVD; No carotid bruits LYMPHATICS: No lymphadenopathy CARDIAC: RRR, no murmurs, rubs, gallops RESPIRATORY:  Clear to auscultation without rales, wheezing or rhonchi  ABDOMEN: Soft, non-tender, non-distended MUSCULOSKELETAL:  No edema; No deformity  SKIN: Warm and dry NEUROLOGIC:  Alert and oriented x 3 PSYCHIATRIC:  Normal affect   ASSESSMENT:    1. Coronary artery disease involving native coronary artery of native heart without angina pectoris   2. Essential hypertension   3. Mixed hyperlipidemia   4. Preop cardiovascular exam    PLAN:    In order of problems listed above:  1. Currently stable without symptoms of angina.  Patient is 10 months out from ACS, treated with PCI of the LAD.  She has tolerated dual antiplatelet therapy without significant bleeding problems.  She had a follow-up myocardial perfusion scan in March 2019, demonstrating normal perfusion with no significant ischemia.  The study is reviewed today. 2. Blood pressure controlled on current therapy.  We will change her metoprolol to metoprolol succinate 25 mg daily.  She understands to continue her beta-blocker perioperatively. 3. Recent lipids reviewed with a cholesterol of 127, HDL 64, LDL 42, triglycerides 107.  LFTs have  been elevated in the past, but on recent labs they are improved with an AST of 34 and an ALT of 39.  Would continue to follow LFTs every 6 months. 4. Patient is approaching 1 year out from PCI.  She is scheduled for surgery next month.  I think it is  reasonable for her to hold antiplatelet therapy for knee replacement.  I recommended that she continue on aspirin 81 mg throughout the perioperative period.  She has discussed this with her orthopedic surgeon and this is an acceptable approach.  She will hold clopidogrel 5 to 7 days prior to surgery.  She should start back on clopidogrel as soon as possible after surgery.  She can achieve greater than 4 metabolic equivalents without any anginal symptoms and she had a stress Myoview scan after PCI that demonstrated normal perfusion (March 2019).  The patient can proceed with surgery at low cardiac risk without further testing.   Medication Adjustments/Labs and Tests Ordered: Current medicines are reviewed at length with the patient today.  Concerns regarding medicines are outlined above.  Orders Placed This Encounter  Procedures  . Lipid panel  . Hepatic function panel   Meds ordered this encounter  Medications  . metoprolol succinate (TOPROL XL) 25 MG 24 hr tablet    Sig: Take 1 tablet (25 mg total) by mouth daily.    Dispense:  90 tablet    Refill:  3    Patient Instructions  Medication Instructions:  1) STOP METOPROLOL TARTRATE 2) START TOPROL 25 mg daily  Labwork: Your provider recommends that you return for FASTING lab work 6 months, prior to your visit with Dr. Burt Knack.  Testing/Procedures: None  Follow-Up: Your provider wants you to follow-up in: 6 months with Dr. Burt Knack or his assistant.  You will receive a reminder letter in the mail two months in advance. If you don't receive a letter, please call our office to schedule the follow-up appointment.    Any Other Special Instructions Will Be Listed Below (If Applicable). You are cleared for your surgery! Stay on your Aspirin please. And good luck!    Signed, Sherren Mocha, MD  07/04/2018 9:12 AM    [place]

## 2018-07-04 NOTE — Patient Instructions (Addendum)
Medication Instructions:  1) STOP METOPROLOL TARTRATE 2) START TOPROL 25 mg daily  Labwork: Your provider recommends that you return for FASTING lab work 6 months, prior to your visit with Dr. Burt Knack.  Testing/Procedures: None  Follow-Up: Your provider wants you to follow-up in: 6 months with Dr. Burt Knack or his assistant.  You will receive a reminder letter in the mail two months in advance. If you don't receive a letter, please call our office to schedule the follow-up appointment.    Any Other Special Instructions Will Be Listed Below (If Applicable). You are cleared for your surgery! Stay on your Aspirin please. And good luck!

## 2018-07-05 ENCOUNTER — Telehealth: Payer: Self-pay | Admitting: Neurology

## 2018-07-05 DIAGNOSIS — G4733 Obstructive sleep apnea (adult) (pediatric): Secondary | ICD-10-CM

## 2018-07-05 DIAGNOSIS — G4731 Primary central sleep apnea: Secondary | ICD-10-CM

## 2018-07-05 DIAGNOSIS — Z9989 Dependence on other enabling machines and devices: Secondary | ICD-10-CM

## 2018-07-05 NOTE — Telephone Encounter (Signed)
Called the patient back and she states that the pressure is coming through is strong in her nasal mask and she is unable to get sleep with this pressure. Informed her Dr Brett Fairy looked over her data and was agreeable to lowering the pressure for her to auto cpap 5-12 cm water pressure and EPR 2 cm. I will send the updated order to [facility]. Pt verbalized understanding and was appreciative.

## 2018-07-05 NOTE — Telephone Encounter (Signed)
Pts called stating the pressure from her CPAP is is too high requesting a call to discuss decreasing it.

## 2018-07-06 ENCOUNTER — Encounter (HOSPITAL_COMMUNITY)
Admission: RE | Admit: 2018-07-06 | Discharge: 2018-07-06 | Disposition: A | Discharge status: Home / Self Care | Payer: Self-pay | Source: Ambulatory Visit | Attending: Cardiovascular Disease | Admitting: Cardiovascular Disease

## 2018-07-07 ENCOUNTER — Other Ambulatory Visit: Payer: Self-pay | Admitting: Oncology

## 2018-07-07 DIAGNOSIS — Z01818 Encounter for other preprocedural examination: Secondary | ICD-10-CM | POA: Diagnosis not present

## 2018-07-07 DIAGNOSIS — I10 Essential (primary) hypertension: Secondary | ICD-10-CM | POA: Diagnosis not present

## 2018-07-07 DIAGNOSIS — Z6827 Body mass index (BMI) 27.0-27.9, adult: Secondary | ICD-10-CM | POA: Diagnosis not present

## 2018-07-07 DIAGNOSIS — M179 Osteoarthritis of knee, unspecified: Secondary | ICD-10-CM | POA: Diagnosis not present

## 2018-07-07 DIAGNOSIS — E7849 Other hyperlipidemia: Secondary | ICD-10-CM | POA: Diagnosis not present

## 2018-07-07 DIAGNOSIS — E538 Deficiency of other specified B group vitamins: Secondary | ICD-10-CM | POA: Diagnosis not present

## 2018-07-07 DIAGNOSIS — I2510 Atherosclerotic heart disease of native coronary artery without angina pectoris: Secondary | ICD-10-CM | POA: Diagnosis not present

## 2018-07-08 ENCOUNTER — Encounter (HOSPITAL_COMMUNITY): Payer: Self-pay

## 2018-07-08 NOTE — H&P (Signed)
TOTAL KNEE ADMISSION H&P  Patient is being admitted for right total knee arthroplasty.  Subjective:  Chief Complaint:   Right knee primary OA / pain  HPI: Tracey [name], 72 y.o. female, has a history of pain and functional disability in the right knee due to arthritis and has failed non-surgical conservative treatments for greater than 12 weeks to include NSAID's and/or analgesics, corticosteriod injections and activity modification.  Onset of symptoms was gradual, starting  years ago with gradually worsening course since that time. The patient noted no past surgery on the right knee(s).  Patient currently rates pain in the right knee(s) at 8 out of 10 with activity. Patient has night pain, worsening of pain with activity and weight bearing, pain that interferes with activities of daily living, pain with passive range of motion, crepitus and joint swelling.  Patient has evidence of periarticular osteophytes and joint space narrowing by imaging studies. There is no active infection.  Risks, benefits and expectations were discussed with the patient.  Risks including but not limited to the risk of anesthesia, blood clots, nerve damage, blood vessel damage, failure of the prosthesis, infection and up to and including death.  Patient understand the risks, benefits and expectations and wishes to proceed with surgery.   PCP: [name], MD  D/C Plans:       Home   Post-op Meds:       No Rx given  Tranexamic Acid:      To be given - IV   Decadron:      Is to be given  FYI:     Plavix & ASA  Norco  CPAP  DME:   Pt already has equipment   PT:   OPPT Rx given   Patient Active Problem List   Diagnosis Date Noted  . UARS (upper airway resistance syndrome) 05/30/2018  . Hyperlipidemia 05/02/2018  . Leg injury, right, initial encounter 01/06/2017  . Contusion of right lower leg 01/06/2017  . Hypoxemia 12/23/2015  . Statin myopathy 05/20/2015  . MCI (mild cognitive impairment) 05/20/2015   . Edema 05/17/2015  . 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 Breast cancer genetic susceptibility 04/02/2015  . OSA on CPAP 04/02/2015  . CAD (coronary artery disease), native coronary artery 01/01/2014  . Chest pain 01/01/2014  . Hypertension   . Ventricular tachycardia, non-sustained ([place])   . OSA (obstructive sleep apnea)   . Breast cancer of upper-outer quadrant of right female breast ([place]) 06/28/2013  . Postoperative visit 06/02/2013  . Gout   . Tachycardia    Past Medical History:  Diagnosis Date  . Allergy   . Anxiety    takes Ativan daily prn  . Arthritis   . Breast cancer ([place]) 2014   ER+/PR+/HEr2-,   . Bruises easily   . Colon polyps    2 polyps by report  . Diverticulosis   . GERD (gastroesophageal reflux disease)    occasionally takes Nexium   . Gout    takes Allopurinol daily and Colchicine daily prn;last attack 108yr ago  . Gout   . History of bladder infections    many yrs ago  . History of bronchitis    last time at least 871yrago  . History of stress incontinence   . Hyperlipidemia    takes Pravastatin daily  . Insomnia    takes Ambien nightly prn  . OSA (obstructive sleep apnea)    on CPAP  . Osteoarthritis   . Peripheral edema  takes Furosemide daily prn  . PONV (postoperative nausea and vomiting)   . Postmenopausal hormone therapy   . Radiation 07/27/13-09/07/13   Right Breast x 31 treatments  . Rhinitis    uses Flonase prn  . Sinus congestion   . Status post breast reconstruction 10/15/14   Bilateral implant removal and DIEP performed in [place], [place]  . Tachycardia    takes Metoprolol daily  . Ventricular tachycardia, non-sustained ([place])    during sleep study 2009 with normal cardiac workup    Past Surgical History:  Procedure Laterality Date  . ABDOMINAL HYSTERECTOMY    . APPENDECTOMY    . AXILLARY SENTINEL NODE BIOPSY Right 05/23/2013   Procedure: AXILLARY SENTINEL NODE BIOPSY;  Surgeon: [name], MD;  Location: [facility];   Service: General;  Laterality: Right;  nuc med injection 7:00  . bilateral cataract surgery    . BREAST RECONSTRUCTION Bilateral 05/17/2014   W  . BREAST RECONSTRUCTION WITH PLACEMENT OF TISSUE EXPANDER AND FLEX HD (ACELLULAR HYDRATED DERMIS) Bilateral 05/23/2013   Procedure: BILATERAL BREAST RECONSTRUCTION WITH PLACEMENT OF TISSUE EXPANDER AND FLEX HD;  Surgeon: [name], MD;  Location: [facility];  Service: Plastics;  Laterality: Bilateral;  . CARDIAC CATHETERIZATION  1999  . CHOLECYSTECTOMY    . COLONOSCOPY    . COSMETIC SURGERY    . GALLBLADDER SURGERY    . REMOVAL OF BILATERAL TISSUE EXPANDERS WITH PLACEMENT OF BILATERAL BREAST IMPLANTS Bilateral 05/17/2014   Procedure: REMOVAL OF BILATERAL TISSUE EXPANDERS WITH PLACEMENT OF BILATERAL BREAST IMPLANTS FOR RECONSTRUCTION;  Surgeon: [name], MD;  Location: [facility];  Service: Plastics;  Laterality: Bilateral;  . TONSILLECTOMY    . TOTAL MASTECTOMY Bilateral 05/23/2013   WITH RECONSTRUCTION  . TOTAL MASTECTOMY Bilateral 05/23/2013   Procedure: TOTAL MASTECTOMY;  Surgeon: [name], MD;  Location: [facility];  Service: General;  Laterality: Bilateral;  . urinary tightening     d/t urinary incontinence    No current facility-administered medications for this encounter.    Current Outpatient Medications  Medication Sig Dispense Refill Last Dose  . allopurinol (ZYLOPRIM) 100 MG tablet TAKE 1 TABLET BY MOUTH ONCE DAILY 90 tablet 0 Taking  . ASPIRIN LOW DOSE 81 MG chewable tablet Chew 81 mg by mouth daily.   0 Taking  . cholecalciferol 5000 UNITS TABS Take 0.2 tablets (1,000 Units total) by mouth daily. 100 tablet 3 Taking  . clopidogrel (PLAVIX) 75 MG tablet TK 1 T PO QD  TK 300 MG 4 T PO 1 TIME ON FIRST DAY THEN 75 MG QD  0 Taking  . colchicine 0.6 MG tablet TAKE 1 TABLET BY MOUTH EVERY DAY OR AS NEEDED 30 tablet 0 Taking  . Cyanocobalamin (VITAMIN B-12 IJ) Inject 1,000 mcg as directed every 30 (thirty) days. Normally 1st of the month    Taking  . cyclobenzaprine (FLEXERIL) 10 MG tablet TK 1 T PO Q 8 H PRF MUSCLE SPASMS  0 Taking  . exemestane (AROMASIN) 25 MG tablet Take 1 tablet (25 mg total) by mouth daily after breakfast. 90 tablet 4 Taking  . ezetimibe (ZETIA) 10 MG tablet Take 1 tablet (10 mg total) by mouth daily. 90 tablet 3 Taking  . fluticasone (FLONASE) 50 MCG/ACT nasal spray Place 1 spray into the nose daily as needed for rhinitis.    Taking  . furosemide (LASIX) 40 MG tablet Take 40 mg by mouth daily as needed for fluid.    Taking  . lisinopril (PRINIVIL,ZESTRIL) 10 MG  tablet TK 1 T PO QD  11 Taking  . LORazepam (ATIVAN) 0.5 MG tablet Take 0.5 mg by mouth at bedtime as needed for anxiety (or sleep).   Taking  . menthol-cetylpyridinium (CEPACOL) 3 MG lozenge Take 1 lozenge (3 mg total) by mouth as needed for sore throat. 100 tablet 12 Taking  . metoprolol succinate (TOPROL XL) 25 MG 24 hr tablet Take 1 tablet (25 mg total) by mouth daily. 90 tablet 3   . pantoprazole (PROTONIX) 40 MG tablet Take 1 tablet (40 mg total) by mouth daily. 90 tablet 3 Taking  . rosuvastatin (CRESTOR) 20 MG tablet Take 1 tablet (20 mg total) by mouth daily. 30 tablet 11 Taking  . Triamcinolone Acetonide (TRIAMCINOLONE 0.1 % CREAM : EUCERIN) CREA Apply 1 application topically 3 (three) times daily. 1 each 1 Taking  . venlafaxine XR (EFFEXOR-XR) 150 MG 24 hr capsule TAKE 1 CAPSULE BY MOUTH DAILY WITH BREAKFAST 90 capsule 0 Taking  . venlafaxine XR (EFFEXOR-XR) 37.5 MG 24 hr capsule TAKE 1 CAPSULE(37.5 MG) BY MOUTH DAILY 90 capsule 0 Taking   Allergies  Allergen Reactions  . Ivp Dye [Iodinated Diagnostic Agents] Hives  . Sulfa Antibiotics Swelling  . Tape Other (See Comments)    Social History   Tobacco Use  . Smoking status: Former Smoker    Packs/day: 1.00    Years: 20.00    Pack years: 20.00    Start date: 06/09/1992  . Smokeless tobacco: Never Used  . Tobacco comment: quit at age 21  Substance Use Topics  . Alcohol use: Yes     Alcohol/week: 5.0 standard drinks    Types: 5 Glasses of wine per week    Comment: nightly    Family History  Problem Relation Age of Onset  . Breast cancer Mother        possible inflammatory breast cancer  . Heart attack Father   . Heart disease Father   . ALS Sister   . Breast cancer Maternal Grandmother 77  . Heart attack Maternal Grandfather   . Heart attack Paternal Grandfather   . Breast cancer Maternal Aunt        dx in her 69s  . Breast cancer Other        maternal great grandmother; dx in her 64s     Review of Systems  Constitutional: Negative.   HENT: Negative.   Eyes: Negative.   Respiratory: Negative.   Cardiovascular: Negative.   Gastrointestinal: Negative.   Genitourinary: Negative.   Musculoskeletal: Positive for joint pain.  Skin: Negative.   Neurological: Negative.   Endo/Heme/Allergies: Negative.   Psychiatric/Behavioral: Negative.     Objective:  Physical Exam  Constitutional: She is oriented to person, place, and time. She appears well-developed.  HENT:  Head: Normocephalic.  Eyes: Pupils are equal, round, and reactive to light.  Neck: Neck supple. No JVD present. No tracheal deviation present. No thyromegaly present.  Cardiovascular: Normal rate, regular rhythm and intact distal pulses.  Respiratory: Effort normal and breath sounds normal. No respiratory distress. She has no wheezes.  GI: Soft. There is no tenderness. There is no guarding.  Musculoskeletal:       Right knee: She exhibits decreased range of motion, swelling and bony tenderness. She exhibits no ecchymosis, no deformity, no laceration and no erythema. Tenderness found.  Lymphadenopathy:    She has no cervical adenopathy.  Neurological: She is alert and oriented to person, place, and time.  Skin: Skin is warm and dry.  Psychiatric: She has a normal mood and affect.      Labs:  Estimated body mass index is 26.96 kg/m as calculated from the following:   Height as of  07/04/18: _0  (1.575 m).   Weight as of 07/04/18: 66.9 kg.   Imaging Review Plain radiographs demonstrate severe degenerative joint disease of the right knee.  The bone quality appears to be good for age and reported activity level.   Preoperative templating of the joint replacement has been completed, documented, and submitted to the Operating Room personnel in order to optimize intra-operative equipment management.   Anticipated LOS equal to or greater than 2 midnights due to - Age 56 and older with one or more of the following:  - Obesity  - Expected need for hospital services (PT, OT, Nursing) required for safe  discharge  - Active co-morbidities: Coronary Artery Disease    Assessment/Plan:  End stage arthritis, right knee   The patient history, physical examination, clinical judgment of the provider and imaging studies are consistent with end stage degenerative joint disease of the right knee(s) and total knee arthroplasty is deemed medically necessary. The treatment options including medical management, injection therapy arthroscopy and arthroplasty were discussed at length. The risks and benefits of total knee arthroplasty were presented and reviewed. The risks due to aseptic loosening, infection, stiffness, patella tracking problems, thromboembolic complications and other imponderables were discussed. The patient acknowledged the explanation, agreed to proceed with the plan and consent was signed. Patient is being admitted for inpatient treatment for surgery, pain control, PT, OT, prophylactic antibiotics, VTE prophylaxis, progressive ambulation and ADL's and discharge planning. The patient is planning to be discharged home.      [place] Mattie Novosel   PA-C  07/08/2018, 3:06 PM

## 2018-07-13 ENCOUNTER — Encounter (HOSPITAL_COMMUNITY): Payer: Self-pay

## 2018-07-19 ENCOUNTER — Other Ambulatory Visit (HOSPITAL_COMMUNITY): Payer: Self-pay

## 2018-07-19 NOTE — Patient Instructions (Addendum)
Tracey [name]  07/19/2018   Your procedure is scheduled on: 07-26-18   Report to [facility] Main  Entrance    Report to admitting at 10:15AM    Call this number if you have problems the morning of surgery [phone]   PLEASE BRING CPAP MASK AND  TUBING ONLY. DEVICE WILL BE PROVIDED!     Remember: Do not eat food or drink liquids :After Midnight. BRUSH YOUR TEETH MORNING OF SURGERY AND RINSE YOUR MOUTH OUT, NO CHEWING GUM CANDY OR MINTS.     Take these medicines the morning of surgery with A SIP OF WATER: metoprolol, allopurinol, rosuvastatin, colchicine, venlafaxine, protonix, ezetimibe                                 You may not have any metal on your body including hair pins and              piercings  Do not wear jewelry, make-up, lotions, powders or perfumes, deodorant             Do not wear nail polish.  Do not shave  48 hours prior to surgery.              Do not bring valuables to the hospital. [place].  Contacts, dentures or bridgework may not be worn into surgery.  Leave suitcase in the car. After surgery it may be brought to your room.                Please read over the following fact sheets you were given: _____________________________________________________________________             [facility] - Preparing for Surgery Before surgery, you can play an important role.  Because skin is not sterile, your skin needs to be as free of germs as possible.  You can reduce the number of germs on your skin by washing with CHG (chlorahexidine gluconate) soap before surgery.  CHG is an antiseptic cleaner which kills germs and bonds with the skin to continue killing germs even after washing. Please DO NOT use if you have an allergy to CHG or antibacterial soaps.  If your skin becomes reddened/irritated stop using the CHG and inform your nurse when you arrive at Short Stay. Do not shave (including  legs and underarms) for at least 48 hours prior to the first CHG shower.  You may shave your face/neck. Please follow these instructions carefully:  1.  Shower with CHG Soap the night before surgery and the  morning of Surgery.  2.  If you choose to wash your hair, wash your hair first as usual with your  normal  shampoo.  3.  After you shampoo, rinse your hair and body thoroughly to remove the  shampoo.                           4.  Use CHG as you would any other liquid soap.  You can apply chg directly  to the skin and wash                       Gently with a scrungie or clean washcloth.  5.  Apply the CHG Soap to your body ONLY FROM THE NECK DOWN.   Do not use on face/ open                           Wound or open sores. Avoid contact with eyes, ears mouth and genitals (private parts).                       Wash face,  Genitals (private parts) with your normal soap.             6.  Wash thoroughly, paying special attention to the area where your surgery  will be performed.  7.  Thoroughly rinse your body with warm water from the neck down.  8.  DO NOT shower/wash with your normal soap after using and rinsing off  the CHG Soap.                9.  Pat yourself dry with a clean towel.            10.  Wear clean pajamas.            11.  Place clean sheets on your bed the night of your first shower and do not  sleep with pets. Day of Surgery : Do not apply any lotions/deodorants the morning of surgery.  Please wear clean clothes to the hospital/surgery center.  FAILURE TO FOLLOW THESE INSTRUCTIONS MAY RESULT IN THE CANCELLATION OF YOUR SURGERY PATIENT SIGNATURE_________________________________  NURSE SIGNATURE__________________________________  ________________________________________________________________________   Adam Phenix  An incentive spirometer is a tool that can help keep your lungs clear and active. This tool measures how well you are filling your lungs with each breath.  Taking long deep breaths may help reverse or decrease the chance of developing breathing (pulmonary) problems (especially infection) following:  A long period of time when you are unable to move or be active. BEFORE THE PROCEDURE   If the spirometer includes an indicator to show your best effort, your nurse or respiratory therapist will set it to a desired goal.  If possible, sit up straight or lean slightly forward. Try not to slouch.  Hold the incentive spirometer in an upright position. INSTRUCTIONS FOR USE  1. Sit on the edge of your bed if possible, or sit up as far as you can in bed or on a chair. 2. Hold the incentive spirometer in an upright position. 3. Breathe out normally. 4. Place the mouthpiece in your mouth and seal your lips tightly around it. 5. Breathe in slowly and as deeply as possible, raising the piston or the ball toward the top of the column. 6. Hold your breath for 3-5 seconds or for as long as possible. Allow the piston or ball to fall to the bottom of the column. 7. Remove the mouthpiece from your mouth and breathe out normally. 8. Rest for a few seconds and repeat Steps 1 through 7 at least 10 times every 1-2 hours when you are awake. Take your time and take a few normal breaths between deep breaths. 9. The spirometer may include an indicator to show your best effort. Use the indicator as a goal to work toward during each repetition. 10. After each set of 10 deep breaths, practice coughing to be sure your lungs are clear. If you have an incision (the cut made at the time of surgery), support your incision when coughing by placing a  pillow or rolled up towels firmly against it. Once you are able to get out of bed, walk around indoors and cough well. You may stop using the incentive spirometer when instructed by your caregiver.  RISKS AND COMPLICATIONS  Take your time so you do not get dizzy or light-headed.  If you are in pain, you may need to take or ask for pain  medication before doing incentive spirometry. It is harder to take a deep breath if you are having pain. AFTER USE  Rest and breathe slowly and easily.  It can be helpful to keep track of a log of your progress. Your caregiver can provide you with a simple table to help with this. If you are using the spirometer at home, follow these instructions: [place] IF:   You are having difficultly using the spirometer.  You have trouble using the spirometer as often as instructed.  Your pain medication is not giving enough relief while using the spirometer.  You develop fever of 100.5 F (38.1 C) or higher. SEEK IMMEDIATE MEDICAL CARE IF:   You cough up bloody sputum that had not been present before.  You develop fever of 102 F (38.9 C) or greater.  You develop worsening pain at or near the incision site. MAKE SURE YOU:   Understand these instructions.  Will watch your condition.  Will get help right away if you are not doing well or get worse. Document Released: 12/14/2006 Document Revised: 10/26/2011 Document Reviewed: 02/14/2007 ExitCare Patient Information 2014 [employer], [place].   ________________________________________________________________________  WHAT IS A BLOOD TRANSFUSION? Blood Transfusion Information  A transfusion is the replacement of blood or some of its parts. Blood is made up of multiple cells which provide different functions.  Red blood cells carry oxygen and are used for blood loss replacement.  White blood cells fight against infection.  Platelets control bleeding.  Plasma helps clot blood.  Other blood products are available for specialized needs, such as hemophilia or other clotting disorders. BEFORE THE TRANSFUSION  Who gives blood for transfusions?   Healthy volunteers who are fully evaluated to make sure their blood is safe. This is blood bank blood. Transfusion therapy is the safest it has ever been in the practice of medicine.  Before blood is taken from a donor, a complete history is taken to make sure that person has no history of diseases nor engages in risky social behavior (examples are intravenous drug use or sexual activity with multiple partners). The donor's travel history is screened to minimize risk of transmitting infections, such as malaria. The donated blood is tested for signs of infectious diseases, such as HIV and hepatitis. The blood is then tested to be sure it is compatible with you in order to minimize the chance of a transfusion reaction. If you or a relative donates blood, this is often done in anticipation of surgery and is not appropriate for emergency situations. It takes many days to process the donated blood. RISKS AND COMPLICATIONS Although transfusion therapy is very safe and saves many lives, the main dangers of transfusion include:   Getting an infectious disease.  Developing a transfusion reaction. This is an allergic reaction to something in the blood you were given. Every precaution is taken to prevent this. The decision to have a blood transfusion has been considered carefully by your caregiver before blood is given. Blood is not given unless the benefits outweigh the risks. AFTER THE TRANSFUSION  Right after receiving a blood transfusion, you  will usually feel much better and more energetic. This is especially true if your red blood cells have gotten low (anemic). The transfusion raises the level of the red blood cells which carry oxygen, and this usually causes an energy increase.  The nurse administering the transfusion will monitor you carefully for complications. HOME CARE INSTRUCTIONS  No special instructions are needed after a transfusion. You may find your energy is better. Speak with your caregiver about any limitations on activity for underlying diseases you may have. SEEK MEDICAL CARE IF:   Your condition is not improving after your transfusion.  You develop redness or  irritation at the intravenous (IV) site. SEEK IMMEDIATE MEDICAL CARE IF:  Any of the following symptoms occur over the next 12 hours:  Shaking chills.  You have a temperature by mouth above 102 F (38.9 C), not controlled by medicine.  Chest, back, or muscle pain.  People around you feel you are not acting correctly or are confused.  Shortness of breath or difficulty breathing.  Dizziness and fainting.  You get a rash or develop hives.  You have a decrease in urine output.  Your urine turns a dark color or changes to pink, red, or brown. Any of the following symptoms occur over the next 10 days:  You have a temperature by mouth above 102 F (38.9 C), not controlled by medicine.  Shortness of breath.  Weakness after normal activity.  The white part of the eye turns yellow (jaundice).  You have a decrease in the amount of urine or are urinating less often.  Your urine turns a dark color or changes to pink, red, or brown. Document Released: 07/31/2000 Document Revised: 10/26/2011 Document Reviewed: 03/19/2008 [facility] Patient Information 2014 [place], [place].  _______________________________________________________________________

## 2018-07-19 NOTE — Progress Notes (Signed)
Cardiac clearance Dr Sherren Mocha 07-07-18 epic  Medical clearance on chart   LOV Dr Ardeth Perfect on chart 07-07-18  ekg 05-02-18 epic   Stress test 11-01-17 epic   Urinalysis, 07-07-18 on chart

## 2018-07-20 ENCOUNTER — Encounter (HOSPITAL_COMMUNITY): Payer: Self-pay | Attending: Cardiovascular Disease

## 2018-07-20 DIAGNOSIS — I252 Old myocardial infarction: Secondary | ICD-10-CM | POA: Insufficient documentation

## 2018-07-21 ENCOUNTER — Encounter (HOSPITAL_COMMUNITY)
Admission: RE | Admit: 2018-07-21 | Discharge: 2018-07-21 | Disposition: A | Discharge status: Home / Self Care | Payer: Medicare Other | Source: Ambulatory Visit | Attending: Orthopedic Surgery | Admitting: Orthopedic Surgery

## 2018-07-21 ENCOUNTER — Encounter (HOSPITAL_COMMUNITY): Payer: Self-pay

## 2018-07-21 ENCOUNTER — Other Ambulatory Visit: Payer: Self-pay

## 2018-07-21 DIAGNOSIS — Z01812 Encounter for preprocedural laboratory examination: Secondary | ICD-10-CM | POA: Insufficient documentation

## 2018-07-21 DIAGNOSIS — M1711 Unilateral primary osteoarthritis, right knee: Secondary | ICD-10-CM | POA: Diagnosis not present

## 2018-07-21 HISTORY — DX: Non-ST elevation (NSTEMI) myocardial infarction: I21.4

## 2018-07-21 LAB — SURGICAL PCR SCREEN
MRSA, PCR: NEGATIVE
STAPHYLOCOCCUS AUREUS: NEGATIVE

## 2018-07-21 LAB — BASIC METABOLIC PANEL
ANION GAP: 9 (ref 5–15)
BUN: 20 mg/dL (ref 8–23)
CALCIUM: 9.5 mg/dL (ref 8.9–10.3)
CO2: 26 mmol/L (ref 22–32)
CREATININE: 1.14 mg/dL — AB (ref 0.44–1.00)
Chloride: 107 mmol/L (ref 98–111)
GFR calc Af Amer: 55 mL/min — ABNORMAL LOW (ref >60)
GFR, EST NON AFRICAN AMERICAN: 48 mL/min — AB (ref >60)
Glucose, Bld: 79 mg/dL (ref 70–99)
Potassium: 4.8 mmol/L (ref 3.5–5.1)
Sodium: 142 mmol/L (ref 135–145)

## 2018-07-21 LAB — CBC
HCT: 42.9 % (ref 36.0–46.0)
Hemoglobin: 13.6 g/dL (ref 12.0–15.0)
MCH: 31.1 pg (ref 26.0–34.0)
MCHC: 31.7 g/dL (ref 30.0–36.0)
MCV: 97.9 fL (ref 80.0–100.0)
PLATELETS: 230 10*3/uL (ref 150–400)
RBC: 4.38 MIL/uL (ref 3.87–5.11)
RDW: 14.6 % (ref 11.5–15.5)
WBC: 6.9 10*3/uL (ref 4.0–10.5)
nRBC: 0 % (ref 0.0–0.2)

## 2018-07-21 LAB — ABO/RH: ABO/RH(D): B POS

## 2018-07-22 ENCOUNTER — Encounter (HOSPITAL_COMMUNITY): Payer: Self-pay

## 2018-07-26 ENCOUNTER — Other Ambulatory Visit: Payer: Self-pay

## 2018-07-26 ENCOUNTER — Inpatient Hospital Stay (HOSPITAL_COMMUNITY): Payer: Medicare Other | Admitting: Certified Registered Nurse Anesthetist

## 2018-07-26 ENCOUNTER — Encounter (HOSPITAL_COMMUNITY): Payer: Self-pay | Admitting: *Deleted

## 2018-07-26 ENCOUNTER — Encounter (HOSPITAL_COMMUNITY)
Admission: RE | Disposition: A | Discharge status: Home / Self Care | Payer: Self-pay | Source: Home / Self Care | Attending: Orthopedic Surgery

## 2018-07-26 ENCOUNTER — Inpatient Hospital Stay (HOSPITAL_COMMUNITY)
Admission: RE | Admit: 2018-07-26 | Discharge: 2018-07-28 | DRG: 470 | Disposition: A | Discharge status: Home / Self Care | Payer: Medicare Other | Attending: Orthopedic Surgery | Admitting: Orthopedic Surgery

## 2018-07-26 DIAGNOSIS — I10 Essential (primary) hypertension: Secondary | ICD-10-CM | POA: Diagnosis not present

## 2018-07-26 DIAGNOSIS — M1711 Unilateral primary osteoarthritis, right knee: Secondary | ICD-10-CM | POA: Diagnosis not present

## 2018-07-26 DIAGNOSIS — F410 Panic disorder [episodic paroxysmal anxiety] without agoraphobia: Secondary | ICD-10-CM | POA: Diagnosis present

## 2018-07-26 DIAGNOSIS — E785 Hyperlipidemia, unspecified: Secondary | ICD-10-CM | POA: Diagnosis not present

## 2018-07-26 DIAGNOSIS — Z96652 Presence of left artificial knee joint: Secondary | ICD-10-CM

## 2018-07-26 DIAGNOSIS — Z79899 Other long term (current) drug therapy: Secondary | ICD-10-CM

## 2018-07-26 DIAGNOSIS — Z7951 Long term (current) use of inhaled steroids: Secondary | ICD-10-CM

## 2018-07-26 DIAGNOSIS — Z9013 Acquired absence of bilateral breasts and nipples: Secondary | ICD-10-CM

## 2018-07-26 DIAGNOSIS — M109 Gout, unspecified: Secondary | ICD-10-CM | POA: Diagnosis not present

## 2018-07-26 DIAGNOSIS — G4733 Obstructive sleep apnea (adult) (pediatric): Secondary | ICD-10-CM | POA: Diagnosis not present

## 2018-07-26 DIAGNOSIS — Z86010 Personal history of colonic polyps: Secondary | ICD-10-CM

## 2018-07-26 DIAGNOSIS — Z6825 Body mass index (BMI) 25.0-25.9, adult: Secondary | ICD-10-CM | POA: Diagnosis not present

## 2018-07-26 DIAGNOSIS — Z803 Family history of malignant neoplasm of breast: Secondary | ICD-10-CM

## 2018-07-26 DIAGNOSIS — I2510 Atherosclerotic heart disease of native coronary artery without angina pectoris: Secondary | ICD-10-CM | POA: Diagnosis present

## 2018-07-26 DIAGNOSIS — K219 Gastro-esophageal reflux disease without esophagitis: Secondary | ICD-10-CM | POA: Diagnosis not present

## 2018-07-26 DIAGNOSIS — I252 Old myocardial infarction: Secondary | ICD-10-CM

## 2018-07-26 DIAGNOSIS — E669 Obesity, unspecified: Secondary | ICD-10-CM | POA: Diagnosis present

## 2018-07-26 DIAGNOSIS — Z882 Allergy status to sulfonamides status: Secondary | ICD-10-CM

## 2018-07-26 DIAGNOSIS — Z96651 Presence of right artificial knee joint: Secondary | ICD-10-CM

## 2018-07-26 DIAGNOSIS — Z9049 Acquired absence of other specified parts of digestive tract: Secondary | ICD-10-CM

## 2018-07-26 DIAGNOSIS — Z87891 Personal history of nicotine dependence: Secondary | ICD-10-CM

## 2018-07-26 DIAGNOSIS — Z853 Personal history of malignant neoplasm of breast: Secondary | ICD-10-CM

## 2018-07-26 DIAGNOSIS — Z7982 Long term (current) use of aspirin: Secondary | ICD-10-CM

## 2018-07-26 DIAGNOSIS — Z96659 Presence of unspecified artificial knee joint: Secondary | ICD-10-CM

## 2018-07-26 DIAGNOSIS — G478 Other sleep disorders: Secondary | ICD-10-CM | POA: Diagnosis present

## 2018-07-26 DIAGNOSIS — Z7901 Long term (current) use of anticoagulants: Secondary | ICD-10-CM

## 2018-07-26 DIAGNOSIS — G8918 Other acute postprocedural pain: Secondary | ICD-10-CM | POA: Diagnosis not present

## 2018-07-26 DIAGNOSIS — Z1501 Genetic susceptibility to malignant neoplasm of breast: Secondary | ICD-10-CM

## 2018-07-26 DIAGNOSIS — Z90710 Acquired absence of both cervix and uterus: Secondary | ICD-10-CM

## 2018-07-26 DIAGNOSIS — Z91041 Radiographic dye allergy status: Secondary | ICD-10-CM

## 2018-07-26 DIAGNOSIS — Z91048 Other nonmedicinal substance allergy status: Secondary | ICD-10-CM

## 2018-07-26 DIAGNOSIS — Z8249 Family history of ischemic heart disease and other diseases of the circulatory system: Secondary | ICD-10-CM

## 2018-07-26 DIAGNOSIS — E663 Overweight: Secondary | ICD-10-CM | POA: Diagnosis present

## 2018-07-26 HISTORY — PX: TOTAL KNEE ARTHROPLASTY: SHX125

## 2018-07-26 LAB — TYPE AND SCREEN
ABO/RH(D): B POS
Antibody Screen: NEGATIVE

## 2018-07-26 SURGERY — ARTHROPLASTY, KNEE, TOTAL
Anesthesia: Spinal | Site: Knee | Laterality: Right

## 2018-07-26 MED ORDER — DEXAMETHASONE SODIUM PHOSPHATE 10 MG/ML IJ SOLN
10.0000 mg | Freq: Once | INTRAMUSCULAR | Status: AC
  Administered 2018-07-27: 10 mg via INTRAVENOUS
  Filled 2018-07-26: qty 1

## 2018-07-26 MED ORDER — PHENYLEPHRINE 40 MCG/ML (10ML) SYRINGE FOR IV PUSH (FOR BLOOD PRESSURE SUPPORT)
PREFILLED_SYRINGE | INTRAVENOUS | Status: AC
  Filled 2018-07-26: qty 10

## 2018-07-26 MED ORDER — DEXAMETHASONE SODIUM PHOSPHATE 10 MG/ML IJ SOLN
INTRAMUSCULAR | Status: AC
  Filled 2018-07-26: qty 1

## 2018-07-26 MED ORDER — HYDROMORPHONE HCL 1 MG/ML IJ SOLN
0.5000 mg | INTRAMUSCULAR | Status: DC | PRN
  Administered 2018-07-27: 1 mg via INTRAVENOUS
  Administered 2018-07-27: 0.5 mg via INTRAVENOUS
  Filled 2018-07-26 (×2): qty 1

## 2018-07-26 MED ORDER — HYDROCODONE-ACETAMINOPHEN 5-325 MG PO TABS
1.0000 | ORAL_TABLET | ORAL | Status: DC | PRN
  Administered 2018-07-26 (×2): 1 via ORAL
  Administered 2018-07-27 (×4): 2 via ORAL
  Filled 2018-07-26: qty 2
  Filled 2018-07-26: qty 1
  Filled 2018-07-26 (×2): qty 2
  Filled 2018-07-26: qty 1
  Filled 2018-07-26: qty 2

## 2018-07-26 MED ORDER — PROPOFOL 500 MG/50ML IV EMUL
INTRAVENOUS | Status: DC | PRN
  Administered 2018-07-26: 75 ug/kg/min via INTRAVENOUS

## 2018-07-26 MED ORDER — CLOPIDOGREL BISULFATE 75 MG PO TABS
75.0000 mg | ORAL_TABLET | Freq: Every day | ORAL | Status: DC
  Administered 2018-07-27 – 2018-07-28 (×2): 75 mg via ORAL
  Filled 2018-07-26 (×2): qty 1

## 2018-07-26 MED ORDER — PHENYLEPHRINE 40 MCG/ML (10ML) SYRINGE FOR IV PUSH (FOR BLOOD PRESSURE SUPPORT)
PREFILLED_SYRINGE | INTRAVENOUS | Status: DC | PRN
  Administered 2018-07-26 (×4): 80 ug via INTRAVENOUS

## 2018-07-26 MED ORDER — DOCUSATE SODIUM 100 MG PO CAPS
100.0000 mg | ORAL_CAPSULE | Freq: Two times a day (BID) | ORAL | Status: DC
  Administered 2018-07-26 – 2018-07-27 (×3): 100 mg via ORAL
  Filled 2018-07-26 (×3): qty 1

## 2018-07-26 MED ORDER — POLYETHYLENE GLYCOL 3350 17 G PO PACK
17.0000 g | PACK | Freq: Two times a day (BID) | ORAL | Status: DC
  Administered 2018-07-26 – 2018-07-27 (×3): 17 g via ORAL
  Filled 2018-07-26 (×3): qty 1

## 2018-07-26 MED ORDER — FENTANYL CITRATE (PF) 100 MCG/2ML IJ SOLN
50.0000 ug | Freq: Once | INTRAMUSCULAR | Status: AC
  Administered 2018-07-26: 75 ug via INTRAVENOUS
  Filled 2018-07-26: qty 2

## 2018-07-26 MED ORDER — ALUM & MAG HYDROXIDE-SIMETH 200-200-20 MG/5ML PO SUSP: 15.0000 mL | ORAL | Status: DC | PRN

## 2018-07-26 MED ORDER — FENTANYL CITRATE (PF) 100 MCG/2ML IJ SOLN: 25.0000 ug | INTRAMUSCULAR | Status: DC | PRN

## 2018-07-26 MED ORDER — ONDANSETRON HCL 4 MG/2ML IJ SOLN
INTRAMUSCULAR | Status: AC
  Filled 2018-07-26: qty 2

## 2018-07-26 MED ORDER — MAGNESIUM CITRATE PO SOLN: 1.0000 | Freq: Once | ORAL | Status: DC | PRN

## 2018-07-26 MED ORDER — CYCLOBENZAPRINE HCL 10 MG PO TABS: 10.0000 mg | ORAL_TABLET | Freq: Three times a day (TID) | ORAL | 0 refills | Status: DC | PRN

## 2018-07-26 MED ORDER — ONDANSETRON HCL 4 MG/2ML IJ SOLN
INTRAMUSCULAR | Status: DC | PRN
  Administered 2018-07-26: 4 mg via INTRAVENOUS

## 2018-07-26 MED ORDER — PHENOL 1.4 % MT LIQD: 1.0000 | OROMUCOSAL | Status: DC | PRN

## 2018-07-26 MED ORDER — METHOCARBAMOL 500 MG IVPB - SIMPLE MED
INTRAVENOUS | Status: AC
  Filled 2018-07-26: qty 50

## 2018-07-26 MED ORDER — ROSUVASTATIN CALCIUM 20 MG PO TABS
20.0000 mg | ORAL_TABLET | Freq: Every day | ORAL | Status: DC
  Administered 2018-07-27: 20 mg via ORAL
  Filled 2018-07-26: qty 1

## 2018-07-26 MED ORDER — BISACODYL 10 MG RE SUPP: 10.0000 mg | Freq: Every day | RECTAL | Status: DC | PRN

## 2018-07-26 MED ORDER — ASPIRIN 81 MG PO CHEW
81.0000 mg | CHEWABLE_TABLET | Freq: Every day | ORAL | Status: DC
  Administered 2018-07-27 – 2018-07-28 (×2): 81 mg via ORAL
  Filled 2018-07-26 (×2): qty 1

## 2018-07-26 MED ORDER — KETOROLAC TROMETHAMINE 30 MG/ML IJ SOLN
INTRAMUSCULAR | Status: AC
  Filled 2018-07-26: qty 1

## 2018-07-26 MED ORDER — BUPIVACAINE-EPINEPHRINE (PF) 0.25% -1:200000 IJ SOLN
INTRAMUSCULAR | Status: DC | PRN
  Administered 2018-07-26: 30 mL

## 2018-07-26 MED ORDER — METOPROLOL SUCCINATE ER 25 MG PO TB24
25.0000 mg | ORAL_TABLET | Freq: Every day | ORAL | Status: DC
  Administered 2018-07-27: 25 mg via ORAL
  Filled 2018-07-26: qty 1

## 2018-07-26 MED ORDER — METOCLOPRAMIDE HCL 5 MG PO TABS: 5.0000 mg | ORAL_TABLET | Freq: Three times a day (TID) | ORAL | Status: DC | PRN

## 2018-07-26 MED ORDER — PROPOFOL 10 MG/ML IV BOLUS
INTRAVENOUS | Status: DC | PRN
  Administered 2018-07-26: 30 mg via INTRAVENOUS

## 2018-07-26 MED ORDER — FUROSEMIDE 40 MG PO TABS: 40.0000 mg | ORAL_TABLET | Freq: Every day | ORAL | Status: DC | PRN

## 2018-07-26 MED ORDER — TRAMADOL HCL 50 MG PO TABS
50.0000 mg | ORAL_TABLET | Freq: Four times a day (QID) | ORAL | Status: DC
  Administered 2018-07-26 – 2018-07-27 (×5): 50 mg via ORAL
  Filled 2018-07-26 (×6): qty 1

## 2018-07-26 MED ORDER — MENTHOL 3 MG MT LOZG: 1.0000 | LOZENGE | OROMUCOSAL | Status: DC | PRN

## 2018-07-26 MED ORDER — PANTOPRAZOLE SODIUM 40 MG PO TBEC
40.0000 mg | DELAYED_RELEASE_TABLET | Freq: Every day | ORAL | Status: DC
  Administered 2018-07-27: 40 mg via ORAL
  Filled 2018-07-26: qty 1

## 2018-07-26 MED ORDER — HYDROCODONE-ACETAMINOPHEN 7.5-325 MG PO TABS: 1.0000 | ORAL_TABLET | ORAL | 0 refills | Status: DC | PRN

## 2018-07-26 MED ORDER — SODIUM CHLORIDE (PF) 0.9 % IJ SOLN
INTRAMUSCULAR | Status: DC | PRN
  Administered 2018-07-26: 30 mL via INTRAVENOUS

## 2018-07-26 MED ORDER — DIPHENHYDRAMINE HCL 12.5 MG/5ML PO ELIX: 12.5000 mg | ORAL_SOLUTION | ORAL | Status: DC | PRN

## 2018-07-26 MED ORDER — CEFAZOLIN SODIUM-DEXTROSE 2-4 GM/100ML-% IV SOLN
2.0000 g | INTRAVENOUS | Status: AC
  Administered 2018-07-26: 2 g via INTRAVENOUS
  Filled 2018-07-26: qty 100

## 2018-07-26 MED ORDER — SODIUM CHLORIDE (PF) 0.9 % IJ SOLN
INTRAMUSCULAR | Status: AC
  Filled 2018-07-26: qty 50

## 2018-07-26 MED ORDER — TRANEXAMIC ACID-NACL 1000-0.7 MG/100ML-% IV SOLN
1000.0000 mg | INTRAVENOUS | Status: AC
  Administered 2018-07-26: 1000 mg via INTRAVENOUS
  Filled 2018-07-26: qty 100

## 2018-07-26 MED ORDER — DEXAMETHASONE SODIUM PHOSPHATE 10 MG/ML IJ SOLN
10.0000 mg | Freq: Once | INTRAMUSCULAR | Status: AC
  Administered 2018-07-26: 10 mg via INTRAVENOUS

## 2018-07-26 MED ORDER — LACTATED RINGERS IV SOLN
INTRAVENOUS | Status: DC
  Administered 2018-07-26 (×3): via INTRAVENOUS

## 2018-07-26 MED ORDER — METHOCARBAMOL 500 MG IVPB - SIMPLE MED
500.0000 mg | Freq: Four times a day (QID) | INTRAVENOUS | Status: DC | PRN
  Filled 2018-07-26: qty 50

## 2018-07-26 MED ORDER — TRANEXAMIC ACID-NACL 1000-0.7 MG/100ML-% IV SOLN
1000.0000 mg | Freq: Once | INTRAVENOUS | Status: AC
  Administered 2018-07-26: 1000 mg via INTRAVENOUS
  Filled 2018-07-26: qty 100

## 2018-07-26 MED ORDER — EXEMESTANE 25 MG PO TABS
25.0000 mg | ORAL_TABLET | Freq: Every day | ORAL | Status: DC
  Filled 2018-07-26 (×2): qty 1

## 2018-07-26 MED ORDER — COLCHICINE 0.6 MG PO TABS: 0.6000 mg | ORAL_TABLET | Freq: Every day | ORAL | Status: DC | PRN

## 2018-07-26 MED ORDER — MIDAZOLAM HCL 2 MG/2ML IJ SOLN
1.0000 mg | Freq: Once | INTRAMUSCULAR | Status: AC
  Administered 2018-07-26: 1.5 mg via INTRAVENOUS
  Filled 2018-07-26: qty 2

## 2018-07-26 MED ORDER — CEFAZOLIN SODIUM-DEXTROSE 2-4 GM/100ML-% IV SOLN
2.0000 g | Freq: Four times a day (QID) | INTRAVENOUS | Status: AC
  Administered 2018-07-26 – 2018-07-27 (×2): 2 g via INTRAVENOUS
  Filled 2018-07-26 (×2): qty 100

## 2018-07-26 MED ORDER — BUPIVACAINE-EPINEPHRINE (PF) 0.25% -1:200000 IJ SOLN
INTRAMUSCULAR | Status: AC
  Filled 2018-07-26: qty 30

## 2018-07-26 MED ORDER — LORAZEPAM 0.5 MG PO TABS
0.5000 mg | ORAL_TABLET | Freq: Every evening | ORAL | Status: DC | PRN
  Administered 2018-07-27: 0.5 mg via ORAL
  Filled 2018-07-26 (×2): qty 1

## 2018-07-26 MED ORDER — SODIUM CHLORIDE 0.9 % IV SOLN
INTRAVENOUS | Status: DC
  Administered 2018-07-26: 17:00:00 via INTRAVENOUS

## 2018-07-26 MED ORDER — HYDROCODONE-ACETAMINOPHEN 7.5-325 MG PO TABS
1.0000 | ORAL_TABLET | ORAL | Status: DC | PRN
  Administered 2018-07-28 (×3): 2 via ORAL
  Filled 2018-07-26 (×3): qty 2

## 2018-07-26 MED ORDER — ALLOPURINOL 100 MG PO TABS
100.0000 mg | ORAL_TABLET | Freq: Every day | ORAL | Status: DC
  Administered 2018-07-27: 100 mg via ORAL
  Filled 2018-07-26 (×2): qty 1

## 2018-07-26 MED ORDER — EZETIMIBE 10 MG PO TABS
10.0000 mg | ORAL_TABLET | Freq: Every day | ORAL | Status: DC
  Administered 2018-07-27: 10 mg via ORAL
  Filled 2018-07-26: qty 1

## 2018-07-26 MED ORDER — ONDANSETRON HCL 4 MG PO TABS: 4.0000 mg | ORAL_TABLET | Freq: Four times a day (QID) | ORAL | Status: DC | PRN

## 2018-07-26 MED ORDER — METHOCARBAMOL 500 MG PO TABS
500.0000 mg | ORAL_TABLET | Freq: Four times a day (QID) | ORAL | Status: DC | PRN
  Administered 2018-07-26 – 2018-07-28 (×5): 500 mg via ORAL
  Filled 2018-07-26 (×5): qty 1

## 2018-07-26 MED ORDER — ACETAMINOPHEN 325 MG PO TABS: 325.0000 mg | ORAL_TABLET | Freq: Four times a day (QID) | ORAL | Status: DC | PRN

## 2018-07-26 MED ORDER — FERROUS SULFATE 325 (65 FE) MG PO TABS: 325.0000 mg | ORAL_TABLET | Freq: Three times a day (TID) | ORAL | 3 refills | Status: DC

## 2018-07-26 MED ORDER — FERROUS SULFATE 325 (65 FE) MG PO TABS
325.0000 mg | ORAL_TABLET | Freq: Two times a day (BID) | ORAL | Status: DC
  Administered 2018-07-26 – 2018-07-28 (×4): 325 mg via ORAL
  Filled 2018-07-26 (×4): qty 1

## 2018-07-26 MED ORDER — ONDANSETRON HCL 4 MG/2ML IJ SOLN: 4.0000 mg | Freq: Four times a day (QID) | INTRAMUSCULAR | Status: DC | PRN

## 2018-07-26 MED ORDER — METOCLOPRAMIDE HCL 5 MG/ML IJ SOLN: 5.0000 mg | Freq: Three times a day (TID) | INTRAMUSCULAR | Status: DC | PRN

## 2018-07-26 MED ORDER — PROPOFOL 10 MG/ML IV BOLUS
INTRAVENOUS | Status: AC
  Filled 2018-07-26: qty 60

## 2018-07-26 MED ORDER — POLYETHYLENE GLYCOL 3350 17 G PO PACK: 17.0000 g | PACK | Freq: Two times a day (BID) | ORAL | 0 refills | Status: DC

## 2018-07-26 MED ORDER — BUPIVACAINE IN DEXTROSE 0.75-8.25 % IT SOLN
INTRATHECAL | Status: DC | PRN
  Administered 2018-07-26: 1.7 mL via INTRATHECAL

## 2018-07-26 MED ORDER — FLUTICASONE PROPIONATE 50 MCG/ACT NA SUSP
1.0000 | Freq: Every day | NASAL | Status: DC
  Administered 2018-07-27: 1 via NASAL
  Filled 2018-07-26: qty 16

## 2018-07-26 MED ORDER — KETOROLAC TROMETHAMINE 30 MG/ML IJ SOLN
INTRAMUSCULAR | Status: DC | PRN
  Administered 2018-07-26: 30 mg via INTRA_ARTICULAR

## 2018-07-26 MED ORDER — VENLAFAXINE HCL ER 150 MG PO CP24
150.0000 mg | ORAL_CAPSULE | Freq: Every day | ORAL | Status: DC
  Administered 2018-07-27 – 2018-07-28 (×2): 150 mg via ORAL
  Filled 2018-07-26 (×2): qty 1

## 2018-07-26 MED ORDER — DOCUSATE SODIUM 100 MG PO CAPS: 100.0000 mg | ORAL_CAPSULE | Freq: Two times a day (BID) | ORAL | 0 refills | Status: DC

## 2018-07-26 MED ORDER — VENLAFAXINE HCL ER 37.5 MG PO CP24: 37.5000 mg | ORAL_CAPSULE | Freq: Every day | ORAL | Status: DC | PRN

## 2018-07-26 MED ORDER — CHLORHEXIDINE GLUCONATE 4 % EX LIQD: 60.0000 mL | Freq: Once | CUTANEOUS | Status: DC

## 2018-07-26 SURGICAL SUPPLY — 55 items
ATTUNE MED ANAT PAT 32 KNEE (Knees) ×2 IMPLANT
ATTUNE PSFEM RTSZ3 NARCEM KNEE (Femur) ×2 IMPLANT
ATTUNE PSRP INSR SZ3 7 KNEE (Insert) ×2 IMPLANT
BAG ZIPLOCK 12X15 (MISCELLANEOUS) IMPLANT
BANDAGE ACE 6X5 VEL STRL LF (GAUZE/BANDAGES/DRESSINGS) ×2 IMPLANT
BASE TIBIAL ROT PLAT SZ 3 KNEE (Knees) ×1 IMPLANT
BLADE SAW SGTL 11.0X1.19X90.0M (BLADE) IMPLANT
BLADE SAW SGTL 13.0X1.19X90.0M (BLADE) ×2 IMPLANT
BLADE SURG SZ10 CARB STEEL (BLADE) ×4 IMPLANT
BOWL SMART MIX CTS (DISPOSABLE) ×2 IMPLANT
CEMENT HV SMART SET (Cement) ×4 IMPLANT
COVER SURGICAL LIGHT HANDLE (MISCELLANEOUS) ×2 IMPLANT
COVER WAND RF STERILE (DRAPES) ×2 IMPLANT
CUFF TOURN SGL QUICK 34 (TOURNIQUET CUFF) ×1
CUFF TRNQT CYL 34X4X40X1 (TOURNIQUET CUFF) ×1 IMPLANT
DECANTER SPIKE VIAL GLASS SM (MISCELLANEOUS) ×2 IMPLANT
DERMABOND ADVANCED (GAUZE/BANDAGES/DRESSINGS) ×1
DERMABOND ADVANCED .7 DNX12 (GAUZE/BANDAGES/DRESSINGS) ×1 IMPLANT
DRAPE U-SHAPE 47X51 STRL (DRAPES) ×2 IMPLANT
DRESSING AQUACEL AG SP 3.5X10 (GAUZE/BANDAGES/DRESSINGS) ×1 IMPLANT
DRSG AQUACEL AG ADV 3.5X10 (GAUZE/BANDAGES/DRESSINGS) ×2 IMPLANT
DRSG AQUACEL AG SP 3.5X10 (GAUZE/BANDAGES/DRESSINGS) ×2
DURAPREP 26ML APPLICATOR (WOUND CARE) ×4 IMPLANT
ELECT REM PT RETURN 15FT ADLT (MISCELLANEOUS) ×2 IMPLANT
GLOVE BIOGEL M 7.0 STRL (GLOVE) IMPLANT
GLOVE BIOGEL PI IND STRL 7.5 (GLOVE) ×1 IMPLANT
GLOVE BIOGEL PI IND STRL 8.5 (GLOVE) ×1 IMPLANT
GLOVE BIOGEL PI INDICATOR 7.5 (GLOVE) ×1
GLOVE BIOGEL PI INDICATOR 8.5 (GLOVE) ×1
GLOVE ECLIPSE 8.0 STRL XLNG CF (GLOVE) ×2 IMPLANT
GLOVE ORTHO TXT STRL SZ7.5 (GLOVE) ×4 IMPLANT
GOWN STRL REUS W/TWL 2XL LVL3 (GOWN DISPOSABLE) ×2 IMPLANT
GOWN STRL REUS W/TWL LRG LVL3 (GOWN DISPOSABLE) ×2 IMPLANT
HANDPIECE INTERPULSE COAX TIP (DISPOSABLE) ×1
HOLDER FOLEY CATH W/STRAP (MISCELLANEOUS) IMPLANT
MANIFOLD NEPTUNE II (INSTRUMENTS) ×2 IMPLANT
NDL SAFETY ECLIPSE 18X1.5 (NEEDLE) IMPLANT
NEEDLE HYPO 18GX1.5 SHARP (NEEDLE)
PACK TOTAL KNEE CUSTOM (KITS) ×2 IMPLANT
PIN FIX SIGMA HP QUICK REL (PIN) ×2 IMPLANT
PROTECTOR NERVE ULNAR (MISCELLANEOUS) ×2 IMPLANT
SET HNDPC FAN SPRY TIP SCT (DISPOSABLE) ×1 IMPLANT
SET PAD KNEE POSITIONER (MISCELLANEOUS) ×2 IMPLANT
SUT MNCRL AB 4-0 PS2 18 (SUTURE) ×2 IMPLANT
SUT STRATAFIX PDS+ 0 24IN (SUTURE) ×2 IMPLANT
SUT VIC AB 1 CT1 36 (SUTURE) ×2 IMPLANT
SUT VIC AB 2-0 CT1 27 (SUTURE) ×3
SUT VIC AB 2-0 CT1 TAPERPNT 27 (SUTURE) ×3 IMPLANT
SYRINGE 3CC LL L/F (MISCELLANEOUS) ×2 IMPLANT
TIBIAL BASE ROT PLAT SZ 3 KNEE (Knees) ×2 IMPLANT
TRAY FOLEY CATH 14FRSI W/METER (CATHETERS) ×2 IMPLANT
TRAY FOLEY MTR SLVR 16FR STAT (SET/KITS/TRAYS/PACK) IMPLANT
WATER STERILE IRR 1000ML POUR (IV SOLUTION) ×2 IMPLANT
WRAP KNEE MAXI GEL POST OP (GAUZE/BANDAGES/DRESSINGS) ×2 IMPLANT
YANKAUER SUCT BULB TIP 10FT TU (MISCELLANEOUS) ×2 IMPLANT

## 2018-07-26 NOTE — Anesthesia Procedure Notes (Addendum)
Spinal  Start time: 07/26/2018 1:17 PM End time: 07/26/2018 1:25 PM Staffing Resident/CRNA: [place], Stephanie C, CRNA Performed: resident/CRNA  Preanesthetic Checklist Completed: patient identified, site marked, surgical consent, pre-op evaluation, timeout performed, IV checked, risks and benefits discussed and monitors and equipment checked Spinal Block Patient position: sitting Prep: site prepped and draped and DuraPrep Patient monitoring: heart rate, cardiac monitor, continuous pulse ox and blood pressure Approach: midline Location: L3-4 Injection technique: single-shot Needle Needle type: Pencan  Needle gauge: 24 G Assessment Sensory level: T6 Additional Notes Kit checked, expiration dates checked, time out performed, good CSF flow, pt tolerated well     

## 2018-07-26 NOTE — Transfer of Care (Signed)
Immediate Anesthesia Transfer of Care Note  Patient: Tracey [name]  Procedure(s) Performed: TOTAL KNEE ARTHROPLASTY (Right Knee)  Patient Location: PACU  Anesthesia Type:Spinal and MAC combined with regional for post-op pain  Level of Consciousness: awake, alert , oriented and patient cooperative  Airway & Oxygen Therapy: Patient Spontanous Breathing and Patient connected to face mask oxygen  Post-op Assessment: Report given to RN and Post -op Vital signs reviewed and stable  Post vital signs: Reviewed and stable  Last Vitals:  Vitals Value Taken Time  BP    Temp    Pulse    Resp 17 07/26/2018  3:06 PM  SpO2    Vitals shown include unvalidated device data.  Last Pain:  Vitals:   07/26/18 1028  TempSrc: Oral         Complications: No apparent anesthesia complications

## 2018-07-26 NOTE — Anesthesia Postprocedure Evaluation (Signed)
Anesthesia Post Note  Patient: Tracey [name]  Procedure(s) Performed: TOTAL KNEE ARTHROPLASTY (Right Knee)     Patient location during evaluation: PACU Anesthesia Type: Spinal Level of consciousness: awake Pain management: pain level controlled Vital Signs Assessment: post-procedure vital signs reviewed and stable Cardiovascular status: stable Postop Assessment: spinal receding Anesthetic complications: no    Last Vitals:  Vitals:   07/26/18 1714 07/26/18 1822  BP: 140/71 140/70  Pulse: 69 75  Resp: 16 16  Temp:  37.1 C  SpO2: 100% 100%    Last Pain:  Vitals:   07/26/18 1819  TempSrc:   PainSc: 0-No pain                 Shyvonne Chastang

## 2018-07-26 NOTE — Progress Notes (Signed)
AssistedDr. Oletta Lamas with right, ultrasound guided, adductor canal block. Side rails up, monitors on throughout procedure. See vital signs in flow sheet. Tolerated Procedure well.

## 2018-07-26 NOTE — Anesthesia Preprocedure Evaluation (Signed)
Anesthesia Evaluation  Patient identified by MRN, date of birth, ID band Patient awake    History of Anesthesia Complications (+) PONV  Airway Mallampati: II  TM Distance: >3 FB     Dental   Pulmonary sleep apnea , former smoker,    breath sounds clear to auscultation       Cardiovascular hypertension, + CAD and + Past MI   Rhythm:Regular Rate:Normal     Neuro/Psych    GI/Hepatic Neg liver ROS, GERD  ,  Endo/Other  negative endocrine ROS  Renal/GU negative Renal ROS     Musculoskeletal  (+) Arthritis ,   Abdominal   Peds  Hematology   Anesthesia Other Findings   Reproductive/Obstetrics                             Anesthesia Physical Anesthesia Plan  ASA: III  Anesthesia Plan: Spinal   Post-op Pain Management:    Induction: Intravenous  PONV Risk Score and Plan: 4 or greater and Ondansetron, Dexamethasone and Midazolam  Airway Management Planned: Nasal Cannula and Simple Face Mask  Additional Equipment:   Intra-op Plan:   Post-operative Plan:   Informed Consent: I have reviewed the patients History and Physical, chart, labs and discussed the procedure including the risks, benefits and alternatives for the proposed anesthesia with the patient or authorized representative who has indicated his/her understanding and acceptance.   Dental advisory given  Plan Discussed with: CRNA and Anesthesiologist  Anesthesia Plan Comments:         Anesthesia Quick Evaluation

## 2018-07-26 NOTE — Care Plan (Signed)
Ortho Bundle Case Management Note  Patient Details  Name: Tracey [name] MRN: [id number] Date of Birth: December 11, 1945  R TKA 07-26-18 DCP:  Home with spouse.  Lives in a 2 story home with 3 ste. DME:  RW and 3-in-1 ordered through [facility]. PT:  [facility].  PT eval scheduled on 08-01-18.                   DME Arranged:  3-N-1, Walker rolling DME Agency:  [employer]  HH Arranged:  NA HH Agency:  NA  Additional Comments: Please contact me with any questions of if this plan should need to change.  Marianne Sofia, RN,CCM [facility]  [phone] 07/26/2018, 10:47 AM

## 2018-07-26 NOTE — Interval H&P Note (Signed)
History and Physical Interval Note:  07/26/2018 12:03 PM  Tracey [name]  has presented today for surgery, with the diagnosis of Right knee osteoarthritis  The various methods of treatment have been discussed with the patient and family. After consideration of risks, benefits and other options for treatment, the patient has consented to  Procedure(s) with comments: TOTAL KNEE ARTHROPLASTY (Right) - 70 mins as a surgical intervention .  The patient's history has been reviewed, patient examined, no change in status, stable for surgery.  I have reviewed the patient's chart and labs.  Questions were answered to the patient's satisfaction.     [name]

## 2018-07-26 NOTE — Op Note (Signed)
NAME:  Tracey [name]                      MEDICAL RECORD NO.:  [id number]                             FACILITY:  [facility]      PHYSICIAN:  Pietro Cassis. Alvan Dame, M.D.  DATE OF BIRTH:  1945-08-18      DATE OF PROCEDURE:  07/26/2018                                     OPERATIVE REPORT         PREOPERATIVE DIAGNOSIS:  Right knee osteoarthritis.      POSTOPERATIVE DIAGNOSIS:  Right knee osteoarthritis.      FINDINGS:  The patient was noted to have complete loss of cartilage and   bone-on-bone arthritis with associated osteophytes in the medial and patellofemoral compartments of   the knee with a significant synovitis and associated effusion.  The patient had failed months of conservative treatment including medications, injection therapy, activity modification.     PROCEDURE:  Right total knee replacement.      COMPONENTS USED:  DePuy Attune rotating platform posterior stabilized knee   system, a size 3N femur, 3 tibia, size 7 mm PS AOX insert, and 32 anatomic patellar   button.      SURGEON:  Pietro Cassis. Alvan Dame, M.D.      ASSISTANT:  Danae Orleans, PA-C.      ANESTHESIA:  Regional and Spinal.      SPECIMENS:  None.      COMPLICATION:  None.      DRAINS:  None.  EBL: <100cc      TOURNIQUET TIME:   Total Tourniquet Time Documented: Thigh (Right) - 30 minutes Total: Thigh (Right) - 3o minutes  .      The patient was stable to the recovery room.      INDICATION FOR PROCEDURE:  Tracey [name] is a 72 y.o. female patient of   mine.  The patient had been seen, evaluated, and treated for months conservatively in the   office with medication, activity modification, and injections.  The patient had   radiographic changes of bone-on-bone arthritis with endplate sclerosis and osteophytes noted.  Based on the radiographic changes and failed conservative measures, the patient   decided to proceed with definitive treatment, total knee replacement.  Risks of infection, DVT, component failure,  need for revision surgery, neurovascular injury were reviewed in the office setting.  The postop course was reviewed stressing the efforts to maximize post-operative satisfaction and function.  Consent was obtained for benefit of pain   relief.      PROCEDURE IN DETAIL:  The patient was brought to the operative theater.   Once adequate anesthesia, preoperative antibiotics, 2 gm of Ancef,1 gm of Tranexamic Acid, and 10 mg of Decadron administered, the patient was positioned supine with a right thigh tourniquet placed.  The  right lower extremity was prepped and draped in sterile fashion.  A time-   out was performed identifying the patient, planned procedure, and the appropriate extremity.      The right lower extremity was placed in the [facility] leg holder.  The leg was   exsanguinated, tourniquet elevated to 250 mmHg.  A midline incision was   made  followed by median parapatellar arthrotomy.  Following initial   exposure, attention was first directed to the patella.  Precut   measurement was noted to be 22 mm.  I resected down to 13 mm and used a   32 anatomic patellar button to restore patellar height as well as cover the cut surface.      The lug holes were drilled and a metal shim was placed to protect the   patella from retractors and saw blade during the procedure.      At this point, attention was now directed to the femur.  The femoral   canal was opened with a drill, irrigated to try to prevent fat emboli.  An   intramedullary rod was passed at 3 degrees valgus, 9 mm of bone was   resected off the distal femur.  Following this resection, the tibia was   subluxated anteriorly.  Using the extramedullary guide, 2 mm of bone was resected off   the proximal medial tibia.  We confirmed the gap would be   stable medially and laterally with a size 6 spacer block as well as confirmed that the tibial cut was perpendicular in the coronal plane, checking with an alignment rod.      Once this was  done, I sized the femur to be a size 3 in the anterior-   posterior dimension, chose a narrow component based on medial and   lateral dimension.  The size 3 rotation block was then pinned in   position anterior referenced using the C-clamp to set rotation.  The   anterior, posterior, and  chamfer cuts were made without difficulty nor   notching making certain that I was along the anterior cortex to help   with flexion gap stability.      The final box cut was made off the lateral aspect of distal femur.      At this point, the tibia was sized to be a size 3.  The size 3 tray was   then pinned in position through the medial third of the tubercle,   drilled, and keel punched.  Trial reduction was now carried with a 3 femur,  3 tibia, a size 6 then 7 mm PS insert, and the 32 anatomic patella botton.  The knee was brought to full extension with good flexion stability with the patella   tracking through the trochlea without application of pressure.  Given   all these findings the trial components removed.  Final components were   opened and cement was mixed.  The knee was irrigated with normal saline solution and pulse lavage.  The synovial lining was   then injected with 30 cc of 0.25% Marcaine with epinephrine, 1 cc of Toradol and 30 cc of NS for a total of 61 cc.     Final implants were then cemented onto cleaned and dried cut surfaces of bone with the knee brought to extension with a size 7 mm PS trial insert.      Once the cement had fully cured, excess cement was removed   throughout the knee.  I confirmed that I was satisfied with the range of   motion and stability, and the final size 7 mm PS AOX insert was chosen.  It was   placed into the knee.      The tourniquet had been let down at 30 minutes.  No significant   hemostasis was required.  The extensor mechanism was then reapproximated using #1  Vicryl and #1 Stratafix sutures with the knee   in flexion.  The   remaining wound was  closed with 2-0 Vicryl and running 4-0 Monocryl.   The knee was cleaned, dried, dressed sterilely using Dermabond and   Aquacel dressing.  The patient was then   brought to recovery room in stable condition, tolerating the procedure   well.   Please note that Physician Assistant, Danae Orleans, PA-C was present for the entirety of the case, and was utilized for pre-operative positioning, peri-operative retractor management, general facilitation of the procedure and for primary wound closure at the end of the case.              Pietro Cassis Alvan Dame, M.D.    07/26/2018 2:37 PM

## 2018-07-26 NOTE — Anesthesia Procedure Notes (Signed)
Anesthesia Regional Block: Adductor canal block   Pre-Anesthetic Checklist: ,, timeout performed, Correct Patient, Correct Site, Correct Laterality, Correct Procedure, Correct Position, site marked, Risks and benefits discussed,  Surgical consent,  Pre-op evaluation,  At surgeon's request and post-op pain management  Laterality: Right  Prep: chloraprep       Needles:  Injection technique: Single-shot  Needle Type: Echogenic Stimulator Needle          Additional Needles:   Procedures: Doppler guided,,,, ultrasound used (permanent image in chart),,,,  Narrative:  Start time: 07/26/2018 12:40 PM End time: 07/26/2018 12:55 PM Injection made incrementally with aspirations every 5 mL.  Performed by: Personally  Anesthesiologist: Belinda Block, MD

## 2018-07-26 NOTE — Evaluation (Signed)
Physical Therapy Evaluation Patient Details Name: Tracey [name] MRN: [id number] DOB: 13-Dec-1945 Today's Date: 07/26/2018   History of Present Illness  72 yo female s/p R TKR on 07/26/18. PMH includes HLD, edema, sleep apnea, CAD, HTN, NSVT, breast cancer 2014 with radiation and total mastectomy/reconstruction, gout, tachycardia, anxiety, OA.   Clinical Impression   Pt presents with R knee pain, decreased R knee ROM, increased time and effort to perform mobility tasks, and fatigue with ambulation. Pt to benefit from acute PT to address deficits. Pt ambulated 40 ft with RW with min guard assist, no R knee instability noted. Pt educated on ankle pumps to increase circulation. PT to progress mobility as tolerated, and will continue to follow acutely.      Follow Up Recommendations Follow surgeon's recommendation for DC plan and follow-up therapies;Supervision for mobility/OOB    Equipment Recommendations  Rolling walker with 5" wheels    Recommendations for Other Services       Precautions / Restrictions Precautions Precautions: Fall Restrictions Weight Bearing Restrictions: No Other Position/Activity Restrictions: WBAT       Mobility  Bed Mobility Overal bed mobility: Needs Assistance Bed Mobility: Supine to Sit     Supine to sit: Min assist;HOB elevated     General bed mobility comments: Min assist for RLE management, cuing to scoot to EOB. Increased time and effort.   Transfers Overall transfer level: Needs assistance Equipment used: Rolling walker (2 wheeled) Transfers: Sit to/from Stand Sit to Stand: Min guard         General transfer comment: Min guard for safety. Pt requesting to stand from low bed height to simulate home environment. Pt with increased effort, but able to perform. VC for hand placement.   Ambulation/Gait Ambulation/Gait assistance: Min guard Gait Distance (Feet): 40 Feet Assistive device: Rolling walker (2 wheeled) Gait Pattern/deviations:  Step-to pattern;Decreased stance time - right;Decreased weight shift to right;Antalgic Gait velocity: decr    General Gait Details: Min guard for safety. No R knee instability noted. Verbal cuing for sequencing, turning. Pt with fatigue after 40 ft ambulation.   Stairs            Wheelchair Mobility    Modified Rankin (Stroke Patients Only)       Balance Overall balance assessment: Mild deficits observed, not formally tested                                           Pertinent Vitals/Pain Pain Assessment: 0-10 Pain Score: 2  Pain Location: R knee  Pain Descriptors / Indicators: Sore Pain Intervention(s): Repositioned;Limited activity within patient's tolerance;Monitored during session    Home Living Family/patient expects to be discharged to:: Private residence Living Arrangements: Spouse/significant other Available Help at Discharge: Family;Available PRN/intermittently Type of Home: House Home Access: Stairs to enter Entrance Stairs-Rails: None Entrance Stairs-Number of Steps: 3 in the garage no handrail, 3 on the back porch with no handrail  Home Layout: Two level;Able to live on main level with bedroom/bathroom Home Equipment: Bedside commode      Prior Function Level of Independence: Independent               Hand Dominance   Dominant Hand: Right    Extremity/Trunk Assessment   Upper Extremity Assessment Upper Extremity Assessment: Overall WFL for tasks assessed    Lower Extremity Assessment Lower Extremity Assessment: Overall WFL for tasks assessed;RLE  deficits/detail RLE Deficits / Details: suspected post-surgical weakness; able to perform quad set, SLR <10* quad lag, ankle pumps  RLE Sensation: WNL    Cervical / Trunk Assessment Cervical / Trunk Assessment: Normal  Communication   Communication: No difficulties  Cognition Arousal/Alertness: Awake/alert Behavior During Therapy: WFL for tasks assessed/performed Overall  Cognitive Status: Within Functional Limits for tasks assessed                                        General Comments      Exercises     Assessment/Plan    PT Assessment Patient needs continued PT services  PT Problem List Decreased strength;Pain;Decreased range of motion;Decreased activity tolerance;Decreased knowledge of use of DME;Decreased balance;Decreased safety awareness;Decreased mobility       PT Treatment Interventions DME instruction;Therapeutic activities;Gait training;Therapeutic exercise;Patient/family education;Stair training;Balance training;Functional mobility training    PT Goals (Current goals can be found in the Care Plan section)  Acute Rehab PT Goals 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 patient Time For Goal Achievement: 08/09/18 Potential to Achieve Goals: Good    Frequency 7X/week   Barriers to discharge        Co-evaluation               AM-PAC PT "6 Clicks" Mobility  Outcome Measure Help needed turning from your back to your side while in a flat bed without using bedrails?: A Little Help needed moving from lying on your back to sitting on the side of a flat bed without using bedrails?: A Little Help needed moving to and from a bed to a chair (including a wheelchair)?: A Little Help needed standing up from a chair using your arms (e.g., wheelchair or bedside chair)?: A Little Help needed to walk in hospital room?: A Little Help needed climbing 3-5 steps with a railing? : A Little 6 Click Score: 18    End of Session Equipment Utilized During Treatment: Gait belt Activity Tolerance: Patient tolerated treatment well;Patient limited by fatigue Patient left: in chair;with chair alarm set;with call bell/phone within reach;with SCD's reapplied Nurse Communication: Mobility status PT Visit Diagnosis: Other abnormalities of gait and mobility (R26.89);Difficulty in  walking, not elsewhere classified (R26.2)    Time: 3009-2330 PT Time Calculation (min) (ACUTE ONLY): 27 min   Charges:   PT Evaluation $PT Eval Low Complexity: 1 Low PT Treatments $Gait Training: 8-22 mins        Julien Girt, PT Acute Rehabilitation Services Pager [phone]  Office [phone]   Roxine Caddy D Elonda Husky 07/26/2018, 7:38 PM

## 2018-07-27 ENCOUNTER — Encounter (HOSPITAL_COMMUNITY): Payer: Self-pay

## 2018-07-27 ENCOUNTER — Encounter (HOSPITAL_COMMUNITY): Payer: Self-pay | Admitting: Orthopedic Surgery

## 2018-07-27 DIAGNOSIS — E785 Hyperlipidemia, unspecified: Secondary | ICD-10-CM | POA: Diagnosis not present

## 2018-07-27 DIAGNOSIS — Z86010 Personal history of colonic polyps: Secondary | ICD-10-CM | POA: Diagnosis not present

## 2018-07-27 DIAGNOSIS — M1711 Unilateral primary osteoarthritis, right knee: Secondary | ICD-10-CM | POA: Diagnosis not present

## 2018-07-27 DIAGNOSIS — G4733 Obstructive sleep apnea (adult) (pediatric): Secondary | ICD-10-CM | POA: Diagnosis not present

## 2018-07-27 DIAGNOSIS — Z96652 Presence of left artificial knee joint: Secondary | ICD-10-CM

## 2018-07-27 DIAGNOSIS — F410 Panic disorder [episodic paroxysmal anxiety] without agoraphobia: Secondary | ICD-10-CM | POA: Diagnosis not present

## 2018-07-27 DIAGNOSIS — Z803 Family history of malignant neoplasm of breast: Secondary | ICD-10-CM | POA: Diagnosis not present

## 2018-07-27 DIAGNOSIS — I252 Old myocardial infarction: Secondary | ICD-10-CM | POA: Diagnosis not present

## 2018-07-27 DIAGNOSIS — E663 Overweight: Secondary | ICD-10-CM | POA: Diagnosis present

## 2018-07-27 DIAGNOSIS — Z6825 Body mass index (BMI) 25.0-25.9, adult: Secondary | ICD-10-CM | POA: Diagnosis not present

## 2018-07-27 DIAGNOSIS — M109 Gout, unspecified: Secondary | ICD-10-CM | POA: Diagnosis not present

## 2018-07-27 DIAGNOSIS — Z7951 Long term (current) use of inhaled steroids: Secondary | ICD-10-CM | POA: Diagnosis not present

## 2018-07-27 DIAGNOSIS — I2510 Atherosclerotic heart disease of native coronary artery without angina pectoris: Secondary | ICD-10-CM | POA: Diagnosis not present

## 2018-07-27 DIAGNOSIS — Z853 Personal history of malignant neoplasm of breast: Secondary | ICD-10-CM | POA: Diagnosis not present

## 2018-07-27 DIAGNOSIS — I10 Essential (primary) hypertension: Secondary | ICD-10-CM | POA: Diagnosis not present

## 2018-07-27 DIAGNOSIS — Z9049 Acquired absence of other specified parts of digestive tract: Secondary | ICD-10-CM | POA: Diagnosis not present

## 2018-07-27 DIAGNOSIS — Z9013 Acquired absence of bilateral breasts and nipples: Secondary | ICD-10-CM | POA: Diagnosis not present

## 2018-07-27 DIAGNOSIS — K219 Gastro-esophageal reflux disease without esophagitis: Secondary | ICD-10-CM | POA: Diagnosis not present

## 2018-07-27 DIAGNOSIS — Z1501 Genetic susceptibility to malignant neoplasm of breast: Secondary | ICD-10-CM | POA: Diagnosis not present

## 2018-07-27 DIAGNOSIS — G478 Other sleep disorders: Secondary | ICD-10-CM | POA: Diagnosis not present

## 2018-07-27 DIAGNOSIS — Z90710 Acquired absence of both cervix and uterus: Secondary | ICD-10-CM | POA: Diagnosis not present

## 2018-07-27 DIAGNOSIS — E669 Obesity, unspecified: Secondary | ICD-10-CM | POA: Diagnosis not present

## 2018-07-27 DIAGNOSIS — Z7901 Long term (current) use of anticoagulants: Secondary | ICD-10-CM | POA: Diagnosis not present

## 2018-07-27 DIAGNOSIS — Z79899 Other long term (current) drug therapy: Secondary | ICD-10-CM | POA: Diagnosis not present

## 2018-07-27 DIAGNOSIS — Z7982 Long term (current) use of aspirin: Secondary | ICD-10-CM | POA: Diagnosis not present

## 2018-07-27 LAB — BASIC METABOLIC PANEL
Anion gap: 11 (ref 5–15)
BUN: 22 mg/dL (ref 8–23)
CO2: 21 mmol/L — ABNORMAL LOW (ref 22–32)
Calcium: 8.8 mg/dL — ABNORMAL LOW (ref 8.9–10.3)
Chloride: 108 mmol/L (ref 98–111)
Creatinine, Ser: 1.1 mg/dL — ABNORMAL HIGH (ref 0.44–1.00)
GFR calc Af Amer: 58 mL/min — ABNORMAL LOW (ref >60)
GFR calc non Af Amer: 50 mL/min — ABNORMAL LOW (ref >60)
Glucose, Bld: 130 mg/dL — ABNORMAL HIGH (ref 70–99)
Potassium: 4.8 mmol/L (ref 3.5–5.1)
Sodium: 140 mmol/L (ref 135–145)

## 2018-07-27 LAB — CBC
HEMATOCRIT: 39.8 % (ref 36.0–46.0)
Hemoglobin: 12.4 g/dL (ref 12.0–15.0)
MCH: 30.5 pg (ref 26.0–34.0)
MCHC: 31.2 g/dL (ref 30.0–36.0)
MCV: 97.8 fL (ref 80.0–100.0)
Platelets: 193 10*3/uL (ref 150–400)
RBC: 4.07 MIL/uL (ref 3.87–5.11)
RDW: 13.8 % (ref 11.5–15.5)
WBC: 9.2 10*3/uL (ref 4.0–10.5)
nRBC: 0 % (ref 0.0–0.2)

## 2018-07-27 NOTE — Progress Notes (Signed)
PT NOTE--  07/27/18 1400  PT Visit Information--exercise focused session, making excellent progress with strength and ROM; will see again inam, should be ready for d/c tomorrow  Assistance Needed +1  History of Present Illness 72 yo female s/p R TKR on 07/26/18. PMH includes HLD, edema, sleep apnea, CAD, HTN, NSVT, breast cancer 2014 with radiation and total mastectomy/reconstruction, gout, tachycardia, anxiety, OA.   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)   Precautions  Precautions Knee;Fall  Restrictions  Weight Bearing Restrictions No  Other Position/Activity Restrictions WBAT   Pain Assessment  Pain Assessment 0-10  Pain Score 5  Pain Location R knee   Pain Descriptors / Indicators Sore  Pain Intervention(s) Limited activity within patient's tolerance;Monitored during session;Premedicated before session  Cognition  Arousal/Alertness Awake/alert  Behavior During Therapy WFL for tasks assessed/performed  Overall Cognitive Status Within Functional Limits for tasks assessed  Transfers  General transfer comment  (pt fatigued deferred gait)  Balance  Overall balance assessment Mild deficits observed, not formally tested  Total Joint Exercises  Ankle Circles/Pumps AROM;Both;20 reps  Quad Sets AROM;Both;10 reps  Heel Slides AROM;AAROM;Right;15 reps  Hip ABduction/ADduction AROM;Right;AAROM;10 reps  Straight Leg Raises AROM;AAROM;Right;10 reps  Goniometric ROM grossly 5* to 65* AAROM right knee flexion  PT - End of Session  Equipment Utilized During Treatment Gait belt  Activity Tolerance Patient tolerated treatment well  Patient left with call bell/phone within reach;with family/visitor present;in bed;with bed alarm set  Nurse Communication Mobility status   PT - Assessment/Plan  PT Plan Current plan remains appropriate  PT Visit Diagnosis Other abnormalities of gait and mobility (R26.89);Difficulty in walking, not elsewhere  classified (R26.2)  PT Frequency (ACUTE ONLY) 7X/week  Follow Up Recommendations Follow surgeon's recommendation for DC plan and follow-up therapies;Supervision for mobility/OOB  PT equipment Rolling walker with 5" wheels  AM-PAC PT "6 Clicks" Mobility Outcome Measure (Version 2)  Help needed turning from your back to your side while in a flat bed without using bedrails? 3  Help needed moving from lying on your back to sitting on the side of a flat bed without using bedrails? 3  Help needed moving to and from a bed to a chair (including a wheelchair)? 3  Help needed standing up from a chair using your arms (e.g., wheelchair or bedside chair)? 3  Help needed to walk in hospital room? 3  Help needed climbing 3-5 steps with a railing?  3  6 Click Score 18  Consider Recommendation of Discharge To: Home with [facility]  Acute Rehab PT Goals  PT Goal Formulation With patient  Time For Goal Achievement 08/09/18  Potential to Achieve Goals Good  PT Time Calculation  PT Start Time (ACUTE ONLY) 1414  PT Stop Time (ACUTE ONLY) 1437  PT Time Calculation (min) (ACUTE ONLY) 23 min  PT General Charges  $$ ACUTE PT VISIT 1 Visit  PT Treatments  $Therapeutic Exercise 23-37 mins

## 2018-07-27 NOTE — Care Management Obs Status (Signed)
[facility] NOTIFICATION   Patient Details  Name: Tracey [name] MRN: [id number] Date of Birth: 1946-05-21   Medicare Observation Status Notification Given:  Yes    Guadalupe Maple, RN 07/27/2018, 2:27 PM

## 2018-07-27 NOTE — Progress Notes (Signed)
Physical Therapy Treatment Patient Details Name: Tracey [name] MRN: [id number] DOB: Dec 11, 1945 Today's Date: 07/27/2018    History of Present Illness 72 yo female s/p R TKR on 07/26/18. PMH includes HLD, edema, sleep apnea, CAD, HTN, NSVT, breast cancer 2014 with radiation and total mastectomy/reconstruction, gout, tachycardia, anxiety, OA.     PT Comments    Pt progressing toward goals, has multiple questions and concerns regarding mobility; reviewed stairs and gait with pt and spouse, will review HEP in pm; plan is for d/c tomorrow   Follow Up Recommendations  Follow surgeon's recommendation for DC plan and follow-up therapies;Supervision for mobility/OOB     Equipment Recommendations  Rolling walker with 5" wheels    Recommendations for Other Services       Precautions / Restrictions Precautions Precautions: Knee;Fall Restrictions Weight Bearing Restrictions: No Other Position/Activity Restrictions: WBAT     Mobility  Bed Mobility               General bed mobility comments: in chair on arrival  Transfers Overall transfer level: Needs assistance Equipment used: Rolling walker (2 wheeled) Transfers: Sit to/from Stand Sit to Stand: Min guard;Supervision         General transfer comment: cues for  hand placment  Ambulation/Gait Ambulation/Gait assistance: Min guard Gait Distance (Feet): 100 Feet Assistive device: Rolling walker (2 wheeled) Gait Pattern/deviations: Step-to pattern;Decreased weight shift to right Gait velocity: decr    General Gait Details: cues for sequence and RW safety   Stairs Stairs: Yes Stairs assistance: Min assist Stair Management: No rails;Backwards;With walker Number of Stairs: 2 General stair comments: cues for sequence   Wheelchair Mobility    Modified Rankin (Stroke Patients Only)       Balance Overall balance assessment: Mild deficits observed, not formally tested                                          Cognition Arousal/Alertness: Awake/alert Behavior During Therapy: WFL for tasks assessed/performed Overall Cognitive Status: Within Functional Limits for tasks assessed                                        Exercises Total Joint Exercises Ankle Circles/Pumps: AROM;Both;20 reps Quad Sets: AROM;Both;10 reps    General Comments        Pertinent Vitals/Pain Pain Assessment: 0-10 Pain Score: 3  Pain Location: R knee  Pain Descriptors / Indicators: Sore Pain Intervention(s): Limited activity within patient's tolerance;Monitored during session;Premedicated before session;Repositioned;Ice applied    Home Living                      Prior Function            PT Goals (current goals can now be found in the care plan section) Acute Rehab PT Goals 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 patient Time For Goal Achievement: 08/09/18 Potential to Achieve Goals: Good Progress towards PT goals: Progressing toward goals    Frequency    7X/week      PT Plan Current plan remains appropriate    Co-evaluation              AM-PAC PT "6 Clicks" Mobility   Outcome Measure  Help  needed turning from your back to your side while in a flat bed without using bedrails?: A Little Help needed moving from lying on your back to sitting on the side of a flat bed without using bedrails?: A Little Help needed moving to and from a bed to a chair (including a wheelchair)?: A Little Help needed standing up from a chair using your arms (e.g., wheelchair or bedside chair)?: A Little Help needed to walk in hospital room?: A Little Help needed climbing 3-5 steps with a railing? : A Little 6 Click Score: 18    End of Session Equipment Utilized During Treatment: Gait belt Activity Tolerance: Patient tolerated treatment well Patient left: in chair;with call bell/phone within reach;with  family/visitor present Nurse Communication: Mobility status PT Visit Diagnosis: Other abnormalities of gait and mobility (R26.89);Difficulty in walking, not elsewhere classified (R26.2)     Time: 4103-0131 PT Time Calculation (min) (ACUTE ONLY): 28 min  Charges:  $Gait Training: 23-37 mins                     Kenyon Ana, PT  Pager: [phone] Acute Rehab Dept [facility]): [phone]   07/27/2018    [facility] 07/27/2018, 11:01 AM

## 2018-07-27 NOTE — Progress Notes (Signed)
     Subjective: 1 Day Post-Op Procedure(s) (LRB): TOTAL KNEE ARTHROPLASTY (Right)   Patient reports pain as mild, pain controlled this morning.  States that pain was rough after the block wore off, until she received a shot of Dilaudid.  Dilaudid help quite a bit and she feels better this morning.   Plan for discharge tomorrow due to underlying medical co-morbidities, pain control and need for inpatient therapy to meet goal of being discharged home safely with family/caregiver.   Anticipated LOS equal to or greater than 2 midnights due to - Age 73 and older with one or more of the following:  - Obesity  - Expected need for hospital services (PT, OT, Nursing) required for safe  discharge  - Active co-morbidities: Coronary Artery Disease, Cardiac Arrhythmia and OSA    Objective:   VITALS:   Vitals:   07/27/18 0536 07/27/18 0908  BP: 119/63 (!) 117/59  Pulse: (!) 59 63  Resp: 16 16  Temp: 98 F (36.7 C) 97.9 F (36.6 C)  SpO2: 100% 99%    Dorsiflexion/Plantar flexion intact Incision: dressing C/D/I No cellulitis present Compartment soft  LABS Recent Labs    07/27/18 0424  HGB 12.4  HCT 39.8  WBC 9.2  PLT 193    Recent Labs    07/27/18 0424  NA 140  K 4.8  BUN 22  CREATININE 1.10*  GLUCOSE 130*     Assessment/Plan: 1 Day Post-Op Procedure(s) (LRB): TOTAL KNEE ARTHROPLASTY (Right) Foley cath d/c'ed Advance diet Up with therapy D/C IV fluids Discharge home Follow up in 2 weeks at [facility] ([place] Follow up with OLIN,Ab Leaming D in 2 weeks.  Contact information:  [facility]) [address], [place] [phone]    Overweight (BMI 25-29.9) Estimated body mass index is 26.16 kg/m as calculated from the following:   Height as of this encounter: 5\' 2"  (1.575 m).   Weight as of this encounter: 64.9 kg. Patient also counseled that weight may inhibit the healing  process Patient counseled that losing weight will help with future health issues         Tracey [name]. Tracey [name]   PAC  07/27/2018, 9:16 AM

## 2018-07-28 LAB — CBC
HCT: 37.7 % (ref 36.0–46.0)
Hemoglobin: 11.8 g/dL — ABNORMAL LOW (ref 12.0–15.0)
MCH: 29.9 pg (ref 26.0–34.0)
MCHC: 31.3 g/dL (ref 30.0–36.0)
MCV: 95.4 fL (ref 80.0–100.0)
Platelets: 212 10*3/uL (ref 150–400)
RBC: 3.95 MIL/uL (ref 3.87–5.11)
RDW: 14.1 % (ref 11.5–15.5)
WBC: 9.5 10*3/uL (ref 4.0–10.5)
nRBC: 0 % (ref 0.0–0.2)

## 2018-07-28 LAB — BASIC METABOLIC PANEL
Anion gap: 10 (ref 5–15)
BUN: 20 mg/dL (ref 8–23)
CO2: 23 mmol/L (ref 22–32)
Calcium: 9.2 mg/dL (ref 8.9–10.3)
Chloride: 108 mmol/L (ref 98–111)
Creatinine, Ser: 0.94 mg/dL (ref 0.44–1.00)
GFR calc non Af Amer: >60 mL/min (ref >60)
Glucose, Bld: 127 mg/dL — ABNORMAL HIGH (ref 70–99)
Potassium: 4.7 mmol/L (ref 3.5–5.1)
Sodium: 141 mmol/L (ref 135–145)

## 2018-07-28 NOTE — Plan of Care (Signed)
Patient discharged home in stable condition. IV dc'd, Rx given

## 2018-07-28 NOTE — Progress Notes (Signed)
Physical Therapy Treatment Patient Details Name: Tracey [name] MRN: [id number] DOB: 1946-05-16 Today's Date: 07/28/2018    History of Present Illness 72 yo female s/p R TKR on 07/26/18. PMH includes HLD, edema, sleep apnea, CAD, HTN, NSVT, breast cancer 2014 with radiation and total mastectomy/reconstruction, gout, tachycardia, anxiety, OA.     PT Comments    Pt progressing well; moving slowly but safely; pt husband present for session, ready for d/c today   Follow Up Recommendations  Follow surgeon's recommendation for DC plan and follow-up therapies;Supervision for mobility/OOB     Equipment Recommendations  Rolling walker with 5" wheels    Recommendations for Other Services       Precautions / Restrictions Precautions Precautions: Knee;Fall Restrictions Weight Bearing Restrictions: No Other Position/Activity Restrictions: WBAT     Mobility  Bed Mobility Overal bed mobility: Needs Assistance Bed Mobility: Supine to Sit;Sit to Supine     Supine to sit: Min assist Sit to supine: Min assist   General bed mobility comments: instructed in use of gait belt as leg lifter  Transfers Overall transfer level: Needs assistance Equipment used: Rolling walker (2 wheeled) Transfers: Sit to/from Stand Sit to Stand: Min guard;Supervision         General transfer comment: cues for  hand placment  Ambulation/Gait Ambulation/Gait assistance: Min guard Gait Distance (Feet): 70 Feet Assistive device: Rolling walker (2 wheeled) Gait Pattern/deviations: Step-to pattern;Decreased weight shift to right Gait velocity: decr    General Gait Details: cues for sequence and RW safety   Stairs             Wheelchair Mobility    Modified Rankin (Stroke Patients Only)       Balance                                            Cognition Arousal/Alertness: Awake/alert Behavior During Therapy: WFL for tasks assessed/performed Overall Cognitive Status:  Within Functional Limits for tasks assessed                                        Exercises Total Joint Exercises Ankle Circles/Pumps: AROM;Both;20 reps Quad Sets: AROM;Both;10 reps Heel Slides: AROM;AAROM;Right;15 reps Hip ABduction/ADduction: AROM;Right;AAROM;10 reps Straight Leg Raises: AROM;AAROM;Right;10 reps    General Comments        Pertinent Vitals/Pain Pain Assessment: 0-10 Pain Score: 6  Pain Location: R knee  Pain Descriptors / Indicators: Sore Pain Intervention(s): Limited activity within patient's tolerance;Monitored during session    Home Living                      Prior Function            PT Goals (current goals can now be found in the care plan section) Acute Rehab PT Goals 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 patient Time For Goal Achievement: 08/09/18 Potential to Achieve Goals: Good Progress towards PT goals: Progressing toward goals    Frequency    7X/week      PT Plan Current plan remains appropriate    Co-evaluation              AM-PAC PT "6 Clicks" Mobility   Outcome Measure  Help needed  turning from your back to your side while in a flat bed without using bedrails?: A Little Help needed moving from lying on your back to sitting on the side of a flat bed without using bedrails?: A Little Help needed moving to and from a bed to a chair (including a wheelchair)?: A Little Help needed standing up from a chair using your arms (e.g., wheelchair or bedside chair)?: A Little Help needed to walk in hospital room?: A Little Help needed climbing 3-5 steps with a railing? : A Little 6 Click Score: 18    End of Session   Activity Tolerance: Patient tolerated treatment well Patient left: in bed;with call bell/phone within reach;with family/visitor present Nurse Communication: Mobility status PT Visit Diagnosis: Other abnormalities of gait and  mobility (R26.89);Difficulty in walking, not elsewhere classified (R26.2)     Time: 2979-8921 PT Time Calculation (min) (ACUTE ONLY): 50 min  Charges:  $Gait Training: 8-22 mins $Therapeutic Exercise: 8-22 mins $Therapeutic Activity: 8-22 mins                     Kenyon Ana, PT  Pager: [phone] Acute Rehab Dept [facility]): [phone]   07/28/2018    [facility] 07/28/2018, 11:21 AM

## 2018-07-28 NOTE — Progress Notes (Signed)
Patient ID: Tracey [name], female   DOB: 1946-06-14, 72 y.o.   MRN: [id number] Subjective: 2 Days Post-Op Procedure(s) (LRB): TOTAL KNEE ARTHROPLASTY (Right)    Patient reports pain as moderate.  Was able to get some sleep after receiving IV meds at some point last night.  Felt she was having a panic attack  Objective:   VITALS:   Vitals:   07/27/18 2234 07/28/18 0519  BP: (!) 158/66 136/65  Pulse: 62 (!) 56  Resp: 17 15  Temp: 97.8 F (36.6 C) 97.7 F (36.5 C)  SpO2: 100% 100%    Neurovascular intact Incision: dressing C/D/I  LABS Recent Labs    07/27/18 0424 07/28/18 0435  HGB 12.4 11.8*  HCT 39.8 37.7  WBC 9.2 9.5  PLT 193 212    Recent Labs    07/27/18 0424 07/28/18 0435  NA 140 141  K 4.8 4.7  BUN 22 20  CREATININE 1.10* 0.94  GLUCOSE 130* 127*    No results for input(s): LABPT, INR in the last 72 hours.   Assessment/Plan: 2 Days Post-Op Procedure(s) (LRB): TOTAL KNEE ARTHROPLASTY (Right)   Up with therapy  Reviewed goals for post op Plan is for home discharge today after therapy  Pain med change discussed and would consider if has more challenges today with PT

## 2018-07-29 ENCOUNTER — Encounter (HOSPITAL_COMMUNITY): Payer: Self-pay

## 2018-08-01 DIAGNOSIS — M25561 Pain in right knee: Secondary | ICD-10-CM | POA: Diagnosis not present

## 2018-08-02 NOTE — Discharge Summary (Signed)
Physician Discharge Summary  Patient ID: IDABELL PICKING MRN: [id number] DOB/AGE: November 28, 1945 72 y.o.  Admit date: 07/26/2018 Discharge date: 07/28/2018   Procedures:  Procedure(s) (LRB): TOTAL KNEE ARTHROPLASTY (Right)  Attending Physician:  Dr. Paralee Cancel   Admission Diagnoses:   Right knee primary OA / pain  Discharge Diagnoses:  Principal Problem:   S/P right TKA Active Problems:   S/P knee replacement   Overweight (BMI 25.0-29.9)   Status post total left knee replacement  Past Medical History:  Diagnosis Date  . Allergy   . Anxiety    takes Ativan daily prn  . Arthritis   . Breast cancer ([place]) 2014   ER+/PR+/HEr2-,   . Bruises easily   . Colon polyps    2 polyps by report  . Diverticulosis   . GERD (gastroesophageal reflux disease)    occasionally takes Nexium   . Gout    takes Allopurinol daily and Colchicine daily prn;last attack 42yr ago  . Gout   . History of bladder infections    many yrs ago  . History of bronchitis    last time at least 875yrago  . History of stress incontinence   . Hyperlipidemia    takes Pravastatin daily  . Insomnia    takes Ambien nightly prn  . [place]STEM[place] (non[place]ST elevated [place]  . OSA (obstructive sleep apnea)    on CPAP  . Osteoarthritis   . Peripheral edema    takes Furosemide daily prn  . PONV (postoperative nausea and vomiting)   . Postmenopausal hormone therapy   . Radiation 07/27/13-09/07/13   Right Breast x 31 treatments  . Rhinitis    uses Flonase prn  . Sinus congestion   . Status post breast reconstruction 10/15/14   [place]  . Tachycardia    takes Metoprolol daily  . [place]sust[place]   during [place] study 2009 with normal cardiac workup    HPI:     Tracey Elliot[age].o. female, has a history of pain and functional disability in the right knee due to arthritis and has failed non-surgical  conservative treatments for greater than 12 weeks to include NSAID's and/or analgesics, corticosteriod injections and activity modification.  Onset of symptoms was gradual, starting  years ago with gradually worsening course since that time. The patient noted no past surgery on the right knee(s).  Patient currently rates pain in the right knee(s) at 8 out of 10 with activity. Patient has night pain, worsening of pain with activity and weight bearing, pain that interferes with activities of daily living, pain with passive range of motion, crepitus and joint swelling.  Patient has evidence of periarticular osteophytes and joint space narrowing by imaging studies. There is no active infection.  Risks, benefits and expectations were discussed with the patient.  Risks including but not limited to the risk of anesthesia, blood clots, nerve damage, blood vessel damage, failure of the prosthesis, infection and up to and including death.  Patient understand the risks, benefits and expectations and wishes to proceed with surgery.   PCP: HoVelna HatchetMD   Discharged Condition: good  Hospital Course:  Patient underwent the above stated procedure on 07/26/2018. Patient tolerated the procedure well and brought to the recovery room in good condition and subsequently to the floor.  POD #1 BP: 117/59 ; Pulse: 63 ; Temp: 97.9 F (36.6 C) ; Resp: 63 Patient reports pain as  mild, pain controlled this morning.  States that pain was rough after the block wore off, until she received a shot of Dilaudid.  Dilaudid help quite a bit and she feels better this morning.   Plan for discharge tomorrowdue to underlying medical co-morbidities, pain control and need for inpatient therapy to meet goal of being discharged home safely with family/caregiver. Dorsiflexion/plantar flexion intact, incision: dressing C/D/I, no cellulitis present and compartment soft.   LABS  Basename    HGB     12.4  HCT     39.8   POD #2  BP:  136/65 ; Pulse: 56 ; Temp: 97.7 F (36.5 C) ; Resp: 15 Patient reports pain as moderate.  Was able to get some sleep after receiving IV meds at some point last night.  Felt she was having a panic attack. Neurovascular intact and incision: dressing C/D/I.   LABS  Basename    HGB     11.8  HCT     37.7    Discharge Exam: General appearance: alert, cooperative and no distress Extremities: Homans sign is negative, no sign of DVT, no edema, redness or tenderness in the calves or thighs and no ulcers, gangrene or trophic changes  Disposition:  Home with follow up in 2 weeks   Follow-up Information    [facility]. Go on 08/01/2018.   Why:  You are scheduled for a physical therapy appointment on 08-01-18 at 11:00 am with Krista Herbst at [facility]. Contact information: [address] [place],  [address] [phone]       Paralee Cancel, MD. Go on 08/08/2018.   Specialty:  Orthopedic Surgery Why:  You are scheduled for a post-operative appointment with Dr. Alvan Dame on 08-08-18 at 10:45 am. Contact information: [address] [place] [address] [phone]           Discharge Instructions    Call MD / Call 911   Complete by:  As directed    If you experience chest pain or shortness of breath, CALL 911 and be transported to the hospital emergency room.  If you develope a fever above 101 F, pus (white drainage) or increased drainage or redness at the wound, or calf pain, call your surgeon's office.   Call MD / Call 911   Complete by:  As directed    If you experience chest pain or shortness of breath, CALL 911 and be transported to the hospital emergency room.  If you develope a fever above 101 F, pus (white drainage) or increased drainage or redness at the wound, or calf pain, call your surgeon's office.   Change dressing   Complete by:  As directed    Maintain surgical dressing until follow up in the clinic. If the edges start  to pull up, may reinforce with tape. If the dressing is no longer working, may remove and cover with gauze and tape, but must keep the area dry and clean.  Call with any questions or concerns.   Change dressing   Complete by:  As directed    Maintain surgical dressing until follow up in the clinic. If the edges start to pull up, may reinforce with tape. If the dressing is no longer working, may remove and cover with gauze and tape, but must keep the area dry and clean.  Call with any questions or concerns.   Constipation Prevention   Complete by:  As directed    Drink plenty of fluids.  Prune juice  may be helpful.  You may use a stool softener, such as Colace (over the counter) 100 mg twice a day.  Use MiraLax (over the counter) for constipation as needed.   Constipation Prevention   Complete by:  As directed    Drink plenty of fluids.  Prune juice may be helpful.  You may use a stool softener, such as Colace (over the counter) 100 mg twice a day.  Use MiraLax (over the counter) for constipation as needed.   Diet - low sodium heart healthy   Complete by:  As directed    Diet - low sodium heart healthy   Complete by:  As directed    Discharge instructions   Complete by:  As directed    Maintain surgical dressing until follow up in the clinic. If the edges start to pull up, may reinforce with tape. If the dressing is no longer working, may remove and cover with gauze and tape, but must keep the area dry and clean.  Follow up in 2 weeks at [facility]. Call with any questions or concerns.   Discharge instructions   Complete by:  As directed    Maintain surgical dressing until follow up in the clinic. If the edges start to pull up, may reinforce with tape. If the dressing is no longer working, may remove and cover with gauze and tape, but must keep the area dry and clean.  Follow up in 2 weeks at [facility]. Call with any questions or concerns.   Increase activity slowly as  tolerated   Complete by:  As directed    Weight bearing as tolerated with assist device (walker, cane, etc) as directed, use it as long as suggested by your surgeon or therapist, typically at least 4-6 weeks.   Increase activity slowly as tolerated   Complete by:  As directed    Weight bearing as tolerated with assist device (walker, cane, etc) as directed, use it as long as suggested by your surgeon or therapist, typically at least 4-6 weeks.   TED hose   Complete by:  As directed    Use stockings (TED hose) for 2 weeks on both leg(s).  You may remove them at night for sleeping.   TED hose   Complete by:  As directed    Use stockings (TED hose) for 2 weeks on both leg(s).  You may remove them at night for sleeping.      Allergies as of 07/28/2018      Reactions   Ivp Dye [iodinated Diagnostic Agents] Hives   Sulfa Antibiotics Swelling   Tape Other (See Comments)   Unknown; has paper thin skin ; ok to use paper tape       Medication List    TAKE these medications   allopurinol 100 MG tablet Commonly known as:  ZYLOPRIM TAKE 1 TABLET BY MOUTH ONCE DAILY   apraclonidine 0.5 % ophthalmic solution Commonly known as:  IOPIDINE Place 1 drop into the right eye daily as needed (for droopy eye).   ASPIRIN LOW DOSE 81 MG chewable tablet Generic drug:  aspirin Chew 81 mg by mouth daily.   clopidogrel 75 MG tablet Commonly known as:  PLAVIX Take 75 mg by mouth daily.   colchicine 0.6 MG tablet TAKE 1 TABLET BY MOUTH EVERY DAY OR AS NEEDED What changed:  See the new instructions.   cyanocobalamin 1000 MCG/ML injection Commonly known as:  (VITAMIN B-12) Inject 1,000 mcg into the muscle every 30 (thirty)  days.   cyclobenzaprine 10 MG tablet Commonly known as:  FLEXERIL Take 1 tablet (10 mg total) by mouth 3 (three) times daily as needed for muscle spasms.   docusate sodium 100 MG capsule Commonly known as:  COLACE Take 1 capsule (100 mg total) by mouth 2 (two) times daily.     exemestane 25 MG tablet Commonly known as:  AROMASIN Take 1 tablet (25 mg total) by mouth daily after breakfast.   ezetimibe 10 MG tablet Commonly known as:  ZETIA Take 1 tablet (10 mg total) by mouth daily.   ferrous sulfate 325 (65 FE) MG tablet Commonly known as:  FERROUSUL Take 1 tablet (325 mg total) by mouth 3 (three) times daily with meals.   fluticasone 50 MCG/ACT nasal spray Commonly known as:  FLONASE Place 1 spray into both nostrils daily.   furosemide 40 MG tablet Commonly known as:  LASIX Take 40 mg by mouth daily as needed for fluid.   HYDROcodone-acetaminophen 7.5-325 MG tablet Commonly known as:  NORCO Take 1-2 tablets by mouth every 4 (four) hours as needed for moderate pain.   HYDROcodone-acetaminophen 7.5-325 MG tablet Commonly known as:  NORCO Take 1-2 tablets by mouth every 4 (four) hours as needed for moderate pain.   lisinopril 10 MG tablet Commonly known as:  PRINIVIL,ZESTRIL Take 10 mg by mouth daily.   LORazepam 0.5 MG tablet Commonly known as:  ATIVAN Take 0.5 mg by mouth at bedtime as needed for anxiety or sleep.   metoprolol succinate 25 MG 24 hr tablet Commonly known as:  TOPROL XL Take 1 tablet (25 mg total) by mouth daily.   pantoprazole 40 MG tablet Commonly known as:  PROTONIX Take 1 tablet (40 mg total) by mouth daily.   polyethylene glycol packet Commonly known as:  MIRALAX / GLYCOLAX Take 17 g by mouth 2 (two) times daily.   rosuvastatin 20 MG tablet Commonly known as:  CRESTOR Take 1 tablet (20 mg total) by mouth daily.   venlafaxine XR 37.5 MG 24 hr capsule Commonly known as:  EFFEXOR-XR TAKE 1 CAPSULE(37.5 MG) BY MOUTH DAILY What changed:  See the new instructions.   venlafaxine XR 150 MG 24 hr capsule Commonly known as:  EFFEXOR-XR TAKE 1 CAPSULE BY MOUTH DAILY WITH BREAKFAST What changed:  See the new instructions.   Vitamin D3 50 MCG (2000 UT) Tabs Take 2,000 Units by mouth daily.            Discharge  Care Instructions  (From admission, onward)         Start     Ordered   07/28/18 0000  Change dressing    Comments:  Maintain surgical dressing until follow up in the clinic. If the edges start to pull up, may reinforce with tape. If the dressing is no longer working, may remove and cover with gauze and tape, but must keep the area dry and clean.  Call with any questions or concerns.   07/28/18 0959   07/27/18 0000  Change dressing    Comments:  Maintain surgical dressing until follow up in the clinic. If the edges start to pull up, may reinforce with tape. If the dressing is no longer working, may remove and cover with gauze and tape, but must keep the area dry and clean.  Call with any questions or concerns.   07/27/18 1856           Signed: [place]. Vianca Bracher   PA-C  08/02/2018, 3:48 PM

## 2018-08-03 ENCOUNTER — Encounter (HOSPITAL_COMMUNITY): Payer: Self-pay

## 2018-08-03 DIAGNOSIS — M25561 Pain in right knee: Secondary | ICD-10-CM | POA: Diagnosis not present

## 2018-08-05 ENCOUNTER — Encounter (HOSPITAL_COMMUNITY): Payer: Self-pay

## 2018-08-05 DIAGNOSIS — M25561 Pain in right knee: Secondary | ICD-10-CM | POA: Diagnosis not present

## 2018-08-08 DIAGNOSIS — M25561 Pain in right knee: Secondary | ICD-10-CM | POA: Diagnosis not present

## 2018-08-11 DIAGNOSIS — M25561 Pain in right knee: Secondary | ICD-10-CM | POA: Diagnosis not present

## 2018-08-12 ENCOUNTER — Encounter (HOSPITAL_COMMUNITY): Payer: Self-pay

## 2018-08-15 DIAGNOSIS — Z96651 Presence of right artificial knee joint: Secondary | ICD-10-CM | POA: Diagnosis not present

## 2018-08-15 DIAGNOSIS — Z471 Aftercare following joint replacement surgery: Secondary | ICD-10-CM | POA: Diagnosis not present

## 2018-08-16 ENCOUNTER — Inpatient Hospital Stay (HOSPITAL_COMMUNITY)
Admission: EM | Admit: 2018-08-16 | Discharge: 2018-08-18 | DRG: 194 | Disposition: A | Discharge status: Home / Self Care | Payer: Medicare Other | Attending: Family Medicine | Admitting: Family Medicine

## 2018-08-16 ENCOUNTER — Emergency Department (HOSPITAL_COMMUNITY): Payer: Medicare Other

## 2018-08-16 ENCOUNTER — Other Ambulatory Visit: Payer: Self-pay

## 2018-08-16 DIAGNOSIS — F419 Anxiety disorder, unspecified: Secondary | ICD-10-CM | POA: Diagnosis not present

## 2018-08-16 DIAGNOSIS — R739 Hyperglycemia, unspecified: Secondary | ICD-10-CM | POA: Diagnosis present

## 2018-08-16 DIAGNOSIS — Z923 Personal history of irradiation: Secondary | ICD-10-CM

## 2018-08-16 DIAGNOSIS — R52 Pain, unspecified: Secondary | ICD-10-CM

## 2018-08-16 DIAGNOSIS — C50411 Malignant neoplasm of upper-outer quadrant of right female breast: Secondary | ICD-10-CM | POA: Diagnosis not present

## 2018-08-16 DIAGNOSIS — Z9013 Acquired absence of bilateral breasts and nipples: Secondary | ICD-10-CM

## 2018-08-16 DIAGNOSIS — E872 Acidosis, unspecified: Secondary | ICD-10-CM | POA: Diagnosis present

## 2018-08-16 DIAGNOSIS — Z882 Allergy status to sulfonamides status: Secondary | ICD-10-CM

## 2018-08-16 DIAGNOSIS — N179 Acute kidney failure, unspecified: Secondary | ICD-10-CM | POA: Diagnosis not present

## 2018-08-16 DIAGNOSIS — Z7902 Long term (current) use of antithrombotics/antiplatelets: Secondary | ICD-10-CM

## 2018-08-16 DIAGNOSIS — R791 Abnormal coagulation profile: Secondary | ICD-10-CM | POA: Diagnosis present

## 2018-08-16 DIAGNOSIS — Z87891 Personal history of nicotine dependence: Secondary | ICD-10-CM

## 2018-08-16 DIAGNOSIS — R000 Tachycardia, unspecified: Secondary | ICD-10-CM

## 2018-08-16 DIAGNOSIS — K219 Gastro-esophageal reflux disease without esophagitis: Secondary | ICD-10-CM | POA: Diagnosis present

## 2018-08-16 DIAGNOSIS — R112 Nausea with vomiting, unspecified: Secondary | ICD-10-CM | POA: Diagnosis present

## 2018-08-16 DIAGNOSIS — I252 Old myocardial infarction: Secondary | ICD-10-CM

## 2018-08-16 DIAGNOSIS — Z7982 Long term (current) use of aspirin: Secondary | ICD-10-CM

## 2018-08-16 DIAGNOSIS — I10 Essential (primary) hypertension: Secondary | ICD-10-CM | POA: Diagnosis not present

## 2018-08-16 DIAGNOSIS — Z8249 Family history of ischemic heart disease and other diseases of the circulatory system: Secondary | ICD-10-CM

## 2018-08-16 DIAGNOSIS — I2510 Atherosclerotic heart disease of native coronary artery without angina pectoris: Secondary | ICD-10-CM | POA: Diagnosis not present

## 2018-08-16 DIAGNOSIS — Z79899 Other long term (current) drug therapy: Secondary | ICD-10-CM

## 2018-08-16 DIAGNOSIS — M7989 Other specified soft tissue disorders: Secondary | ICD-10-CM | POA: Diagnosis present

## 2018-08-16 DIAGNOSIS — Z91048 Other nonmedicinal substance allergy status: Secondary | ICD-10-CM

## 2018-08-16 DIAGNOSIS — R079 Chest pain, unspecified: Secondary | ICD-10-CM | POA: Diagnosis present

## 2018-08-16 DIAGNOSIS — R197 Diarrhea, unspecified: Secondary | ICD-10-CM | POA: Diagnosis not present

## 2018-08-16 DIAGNOSIS — Z96651 Presence of right artificial knee joint: Secondary | ICD-10-CM | POA: Diagnosis present

## 2018-08-16 DIAGNOSIS — E860 Dehydration: Secondary | ICD-10-CM | POA: Diagnosis not present

## 2018-08-16 DIAGNOSIS — J189 Pneumonia, unspecified organism: Secondary | ICD-10-CM | POA: Diagnosis not present

## 2018-08-16 DIAGNOSIS — Z853 Personal history of malignant neoplasm of breast: Secondary | ICD-10-CM

## 2018-08-16 DIAGNOSIS — Z79811 Long term (current) use of aromatase inhibitors: Secondary | ICD-10-CM

## 2018-08-16 DIAGNOSIS — Z91041 Radiographic dye allergy status: Secondary | ICD-10-CM

## 2018-08-16 DIAGNOSIS — I25119 Atherosclerotic heart disease of native coronary artery with unspecified angina pectoris: Secondary | ICD-10-CM | POA: Diagnosis present

## 2018-08-16 DIAGNOSIS — Z955 Presence of coronary angioplasty implant and graft: Secondary | ICD-10-CM

## 2018-08-16 DIAGNOSIS — Z9882 Breast implant status: Secondary | ICD-10-CM

## 2018-08-16 DIAGNOSIS — M109 Gout, unspecified: Secondary | ICD-10-CM | POA: Diagnosis present

## 2018-08-16 DIAGNOSIS — R0602 Shortness of breath: Secondary | ICD-10-CM | POA: Diagnosis not present

## 2018-08-16 DIAGNOSIS — G4733 Obstructive sleep apnea (adult) (pediatric): Secondary | ICD-10-CM | POA: Diagnosis present

## 2018-08-16 LAB — BASIC METABOLIC PANEL
Anion gap: 18 — ABNORMAL HIGH (ref 5–15)
BUN: 15 mg/dL (ref 8–23)
CHLORIDE: 106 mmol/L (ref 98–111)
CO2: 18 mmol/L — ABNORMAL LOW (ref 22–32)
CREATININE: 1.31 mg/dL — AB (ref 0.44–1.00)
Calcium: 10.2 mg/dL (ref 8.9–10.3)
GFR calc Af Amer: 47 mL/min — ABNORMAL LOW (ref >60)
GFR calc non Af Amer: 40 mL/min — ABNORMAL LOW (ref >60)
Glucose, Bld: 101 mg/dL — ABNORMAL HIGH (ref 70–99)
Potassium: 4.5 mmol/L (ref 3.5–5.1)
SODIUM: 142 mmol/L (ref 135–145)

## 2018-08-16 LAB — CBC
HCT: 38.6 % (ref 36.0–46.0)
Hemoglobin: 12.7 g/dL (ref 12.0–15.0)
MCH: 30 pg (ref 26.0–34.0)
MCHC: 32.9 g/dL (ref 30.0–36.0)
MCV: 91.3 fL (ref 80.0–100.0)
Platelets: 450 10*3/uL — ABNORMAL HIGH (ref 150–400)
RBC: 4.23 MIL/uL (ref 3.87–5.11)
RDW: 13.9 % (ref 11.5–15.5)
WBC: 9.8 10*3/uL (ref 4.0–10.5)
nRBC: 0 % (ref 0.0–0.2)

## 2018-08-16 LAB — I-STAT TROPONIN, ED: Troponin i, poc: 0 ng/mL (ref 0.00–0.08)

## 2018-08-16 LAB — D-DIMER, QUANTITATIVE: D-Dimer, Quant: 12.38 ug/mL-FEU — ABNORMAL HIGH (ref 0.00–0.50)

## 2018-08-16 IMAGING — DX DG CHEST 1V PORT
1 series · 1 of 1 positions shown · non-contrast
Comparison: [DATE]

CLINICAL DATA: Shortness of breath this evening

EXAM:
PORTABLE CHEST 1 VIEW

[chest ap]
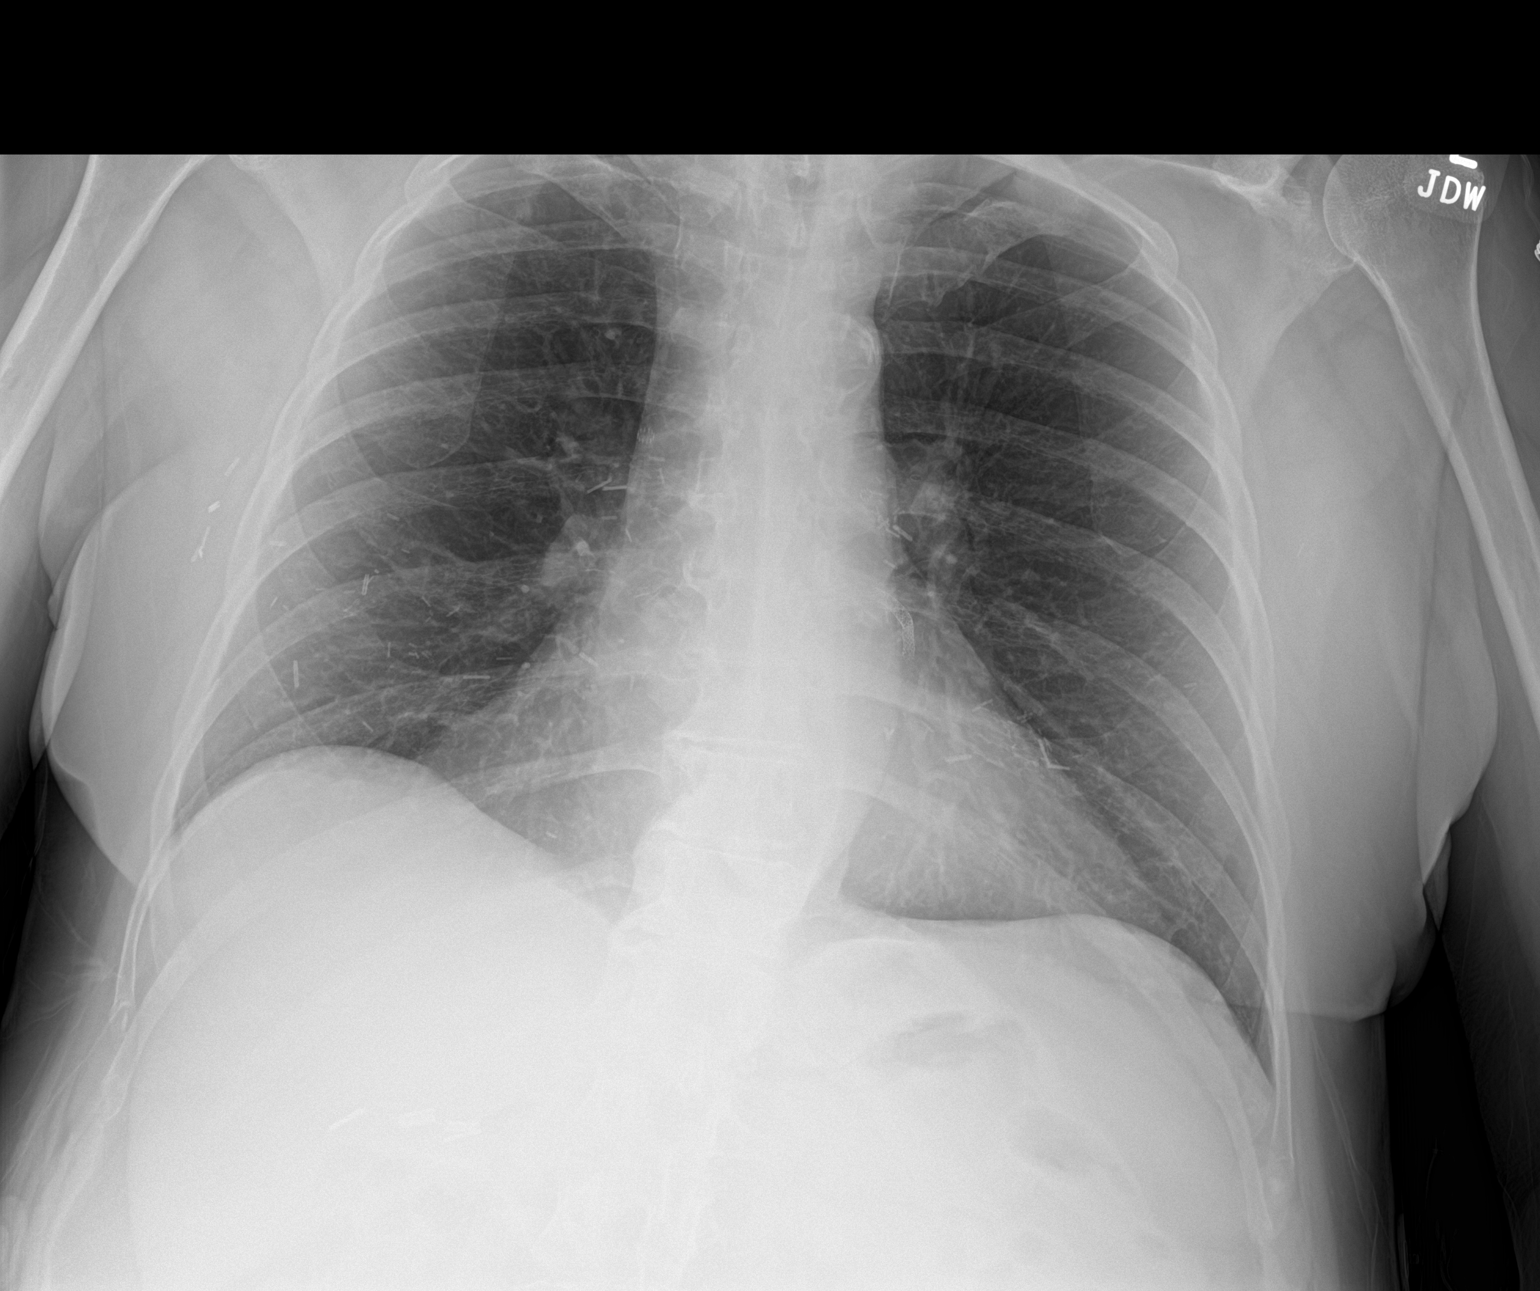

[1 of 1 positions shown; findings below may reference images not displayed]

FINDINGS: The heart size and mediastinal contours are stable. Coronary artery
stent is noted. Prominent right pericardial fat pad is unchanged
compared prior exam. Multiple surgical clips are projected over the
bilateral mid to lower chest. There is no focal infiltrate,
pulmonary edema, or pleural effusion. The visualized skeletal
structures are unremarkable.
IMPRESSION: No acute cardiopulmonary disease identified.

## 2018-08-16 MED ORDER — ONDANSETRON HCL 4 MG/2ML IJ SOLN
4.0000 mg | Freq: Once | INTRAMUSCULAR | Status: AC
  Administered 2018-08-16: 4 mg via INTRAVENOUS
  Filled 2018-08-16: qty 2

## 2018-08-16 MED ORDER — COLCHICINE 0.6 MG PO TABS: 0.6000 mg | ORAL_TABLET | Freq: Every day | ORAL | Status: DC | PRN

## 2018-08-16 MED ORDER — CLOPIDOGREL BISULFATE 75 MG PO TABS
75.0000 mg | ORAL_TABLET | Freq: Every day | ORAL | Status: DC
  Administered 2018-08-17 – 2018-08-18 (×2): 75 mg via ORAL
  Filled 2018-08-16 (×2): qty 1

## 2018-08-16 MED ORDER — DIPHENHYDRAMINE HCL 25 MG PO CAPS
50.0000 mg | ORAL_CAPSULE | Freq: Once | ORAL | Status: AC
  Administered 2018-08-17: 50 mg via ORAL
  Filled 2018-08-16: qty 2

## 2018-08-16 MED ORDER — EXEMESTANE 25 MG PO TABS
25.0000 mg | ORAL_TABLET | Freq: Every day | ORAL | Status: DC
  Filled 2018-08-16 (×2): qty 1

## 2018-08-16 MED ORDER — APRACLONIDINE HCL 0.5 % OP SOLN: 1.0000 [drp] | Freq: Every day | OPHTHALMIC | Status: DC | PRN

## 2018-08-16 MED ORDER — ENOXAPARIN SODIUM 80 MG/0.8ML ~~LOC~~ SOLN
65.0000 mg | Freq: Two times a day (BID) | SUBCUTANEOUS | Status: DC
  Filled 2018-08-16: qty 0.65

## 2018-08-16 MED ORDER — PREDNISONE 20 MG PO TABS
50.0000 mg | ORAL_TABLET | Freq: Four times a day (QID) | ORAL | Status: AC
  Administered 2018-08-17: 50 mg via ORAL
  Filled 2018-08-16 (×2): qty 3

## 2018-08-16 MED ORDER — EZETIMIBE 10 MG PO TABS
10.0000 mg | ORAL_TABLET | Freq: Every day | ORAL | Status: DC
  Administered 2018-08-17 – 2018-08-18 (×2): 10 mg via ORAL
  Filled 2018-08-16 (×2): qty 1

## 2018-08-16 MED ORDER — DIPHENHYDRAMINE HCL 50 MG/ML IJ SOLN: 50.0000 mg | Freq: Once | INTRAMUSCULAR | Status: AC

## 2018-08-16 MED ORDER — SODIUM CHLORIDE 0.9 % IV BOLUS
500.0000 mL | Freq: Once | INTRAVENOUS | Status: AC
  Administered 2018-08-16: 500 mL via INTRAVENOUS

## 2018-08-16 MED ORDER — ONDANSETRON HCL 4 MG/2ML IJ SOLN
4.0000 mg | Freq: Four times a day (QID) | INTRAMUSCULAR | Status: DC | PRN
  Administered 2018-08-16: 4 mg via INTRAVENOUS
  Filled 2018-08-16: qty 2

## 2018-08-16 MED ORDER — ASPIRIN 81 MG PO CHEW
81.0000 mg | CHEWABLE_TABLET | Freq: Every day | ORAL | Status: DC
  Administered 2018-08-17 – 2018-08-18 (×2): 81 mg via ORAL
  Filled 2018-08-16 (×2): qty 1

## 2018-08-16 MED ORDER — SODIUM CHLORIDE 0.9 % IV SOLN
INTRAVENOUS | Status: AC
  Administered 2018-08-17: 09:00:00 via INTRAVENOUS

## 2018-08-16 MED ORDER — ASPIRIN 81 MG PO CHEW
324.0000 mg | CHEWABLE_TABLET | Freq: Once | ORAL | Status: AC
  Administered 2018-08-16: 324 mg via ORAL
  Filled 2018-08-16: qty 4

## 2018-08-16 MED ORDER — VENLAFAXINE HCL ER 75 MG PO CP24
150.0000 mg | ORAL_CAPSULE | Freq: Every day | ORAL | Status: DC
  Administered 2018-08-17 – 2018-08-18 (×2): 150 mg via ORAL
  Filled 2018-08-16: qty 1
  Filled 2018-08-16: qty 2

## 2018-08-16 MED ORDER — ONDANSETRON HCL 4 MG PO TABS
4.0000 mg | ORAL_TABLET | Freq: Four times a day (QID) | ORAL | Status: DC | PRN
  Administered 2018-08-17: 4 mg via ORAL
  Filled 2018-08-16: qty 1

## 2018-08-16 MED ORDER — MORPHINE SULFATE (PF) 2 MG/ML IV SOLN: 2.0000 mg | INTRAVENOUS | Status: DC | PRN

## 2018-08-16 MED ORDER — ENOXAPARIN SODIUM 150 MG/ML ~~LOC~~ SOLN: 1.0000 mg/kg | Freq: Once | SUBCUTANEOUS | Status: DC

## 2018-08-16 MED ORDER — ROSUVASTATIN CALCIUM 20 MG PO TABS
20.0000 mg | ORAL_TABLET | Freq: Every day | ORAL | Status: DC
  Administered 2018-08-17 – 2018-08-18 (×2): 20 mg via ORAL
  Filled 2018-08-16 (×2): qty 1

## 2018-08-16 MED ORDER — ALBUTEROL SULFATE (2.5 MG/3ML) 0.083% IN NEBU: 2.5000 mg | INHALATION_SOLUTION | Freq: Four times a day (QID) | RESPIRATORY_TRACT | Status: DC | PRN

## 2018-08-16 MED ORDER — VENLAFAXINE HCL ER 37.5 MG PO CP24
37.5000 mg | ORAL_CAPSULE | Freq: Every day | ORAL | Status: DC | PRN
  Filled 2018-08-16: qty 1

## 2018-08-16 MED ORDER — SODIUM CHLORIDE 0.9% FLUSH
3.0000 mL | Freq: Two times a day (BID) | INTRAVENOUS | Status: DC
  Administered 2018-08-16 – 2018-08-17 (×2): 3 mL via INTRAVENOUS

## 2018-08-16 MED ORDER — METOPROLOL SUCCINATE ER 25 MG PO TB24
25.0000 mg | ORAL_TABLET | ORAL | Status: DC
  Administered 2018-08-17: 25 mg via ORAL
  Filled 2018-08-16 (×2): qty 1

## 2018-08-16 MED ORDER — ALLOPURINOL 100 MG PO TABS
100.0000 mg | ORAL_TABLET | Freq: Every day | ORAL | Status: DC
  Administered 2018-08-17 – 2018-08-18 (×2): 100 mg via ORAL
  Filled 2018-08-16 (×2): qty 1

## 2018-08-16 MED ORDER — ACETAMINOPHEN 650 MG RE SUPP: 650.0000 mg | Freq: Four times a day (QID) | RECTAL | Status: DC | PRN

## 2018-08-16 MED ORDER — ENOXAPARIN SODIUM 80 MG/0.8ML ~~LOC~~ SOLN
65.0000 mg | Freq: Once | SUBCUTANEOUS | Status: AC
  Administered 2018-08-16: 65 mg via SUBCUTANEOUS
  Filled 2018-08-16: qty 0.65

## 2018-08-16 MED ORDER — PANTOPRAZOLE SODIUM 40 MG PO TBEC
40.0000 mg | DELAYED_RELEASE_TABLET | Freq: Every day | ORAL | Status: DC
  Administered 2018-08-17 – 2018-08-18 (×2): 40 mg via ORAL
  Filled 2018-08-16 (×2): qty 1

## 2018-08-16 MED ORDER — ACETAMINOPHEN 325 MG PO TABS
650.0000 mg | ORAL_TABLET | Freq: Four times a day (QID) | ORAL | Status: DC | PRN
  Administered 2018-08-18: 650 mg via ORAL
  Filled 2018-08-16: qty 2

## 2018-08-16 NOTE — ED Triage Notes (Signed)
Patient c/o CP, dizziness and nausea that began this afternoon.

## 2018-08-16 NOTE — ED Provider Notes (Signed)
[facility] EMERGENCY DEPARTMENT Provider Note   CSN: [id number] Arrival date & time: 08/16/18  2008     History   Chief Complaint Chief Complaint  Patient presents with  . Chest Pain    HPI Tracey [name] is a 72 y.o. female.  HPI Patient with recent total right knee replacement.  Tracey [name] is had some swelling to the right lower extremity.  States that this afternoon while talking on the phone with her daughter Tracey [name] became anxious developed shortness of breath and chest pain.  Tracey [name] also had nausea with one episode of vomiting.  Denies cough, fever or chills.  Given Ativan prior to come to the emergency department with some improvement of her anxiety. Past Medical History:  Diagnosis Date  . Allergy   . Anxiety    takes Ativan daily prn  . Arthritis   . Breast cancer ([place]) 2014   ER+/PR+/HEr2-,   . Bruises easily   . Colon polyps    2 polyps by report  . Diverticulosis   . GERD (gastroesophageal reflux disease)    occasionally takes Nexium   . Gout    takes Allopurinol daily and Colchicine daily prn;last attack 33yr ago  . Gout   . History of bladder infections    many yrs ago  . History of bronchitis    last time at least 842yrago  . History of stress incontinence   . Hyperlipidemia    takes Pravastatin daily  . Insomnia    takes Ambien nightly prn  . NSTEMI (non-ST elevated myocardial infarction) ([facility]/[place]  . OSA (obstructive sleep apnea)    on CPAP  . Osteoarthritis   . Peripheral edema    takes Furosemide daily prn  . PONV (postoperative nausea and vomiting)   . Postmenopausal hormone therapy   . Radiation 07/27/13-09/07/13   Right Breast x 31 treatments  . Rhinitis    uses Flonase prn  . Sinus congestion   . Status post breast reconstruction 10/15/14   [place]  . Tachycardia    takes Metoprolol daily  . Ventricular tachycardia, non-sustained ([facility]   during sleep study 2009 with  normal cardiac workup    Patient Active Problem List   Diagnosis Date Noted  . Overweight (BMI 25.0-29.9) 07/27/2018  . 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/27/2018  . S/P right TKA 07/26/2018  . S/P knee replacement 07/26/2018  . UARS (upper airway resistance syndrome) 05/30/2018  . Hyperlipidemia 05/02/2018  . Leg injury, right, initial encounter 01/06/2017  . Contusion of right lower leg 01/06/2017  . Hypoxemia 12/23/2015  . Statin myopathy 05/20/2015  . MCI (mild cognitive impairment) 05/20/2015  . Edema 05/17/2015  . 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 Breast cancer genetic susceptibility 04/02/2015  . OSA on CPAP 04/02/2015  . CAD (coronary artery disease), native coronary artery 01/01/2014  . Chest pain 01/01/2014  . Hypertension   . Ventricular tachycardia, non-sustained ([facility]  . OSA (obstructive sleep apnea)   . Breast cancer of upper-outer quadrant of right female breast ([facility]/07/2013  . Postoperative visit 06/02/2013  . Gout   . Tachycardia     Past Surgical History:  Procedure Laterality Date  . ABDOMINAL HYSTERECTOMY    . APPENDECTOMY    . AXILLARY SENTINEL NODE BIOPSY Right 05/23/2013   Procedure: AXILLARY SENTINEL NODE BIOPSY;  Surgeon: ToOdis HollingsheadMD;  [place] [facility] Service: [facility];  Laterality:  Right;  nuc med injection 7:00  . bilateral cataract surgery    . BREAST RECONSTRUCTION Bilateral 05/17/2014   W  . BREAST RECONSTRUCTION WITH PLACEMENT OF TISSUE EXPANDER AND FLEX HD (ACELLULAR HYDRATED DERMIS) Bilateral 05/23/2013   Procedure: BILATERAL BREAST RECONSTRUCTION WITH PLACEMENT OF TISSUE EXPANDER AND FLEX HD;  Surgeon: [name], MD;  Location: [facility];  Service: Plastics;  Laterality: Bilateral;  . CARDIAC CATHETERIZATION  1999  . CHOLECYSTECTOMY    . COLONOSCOPY    . COSMETIC SURGERY    . CYSTECTOMY  09/2017   had cyst removal from spine   . GALLBLADDER SURGERY    . PERCUTANEOUS CORONARY STENT INTERVENTION (PCI-S)   08/2017   done in [place]    . REMOVAL OF BILATERAL TISSUE EXPANDERS WITH PLACEMENT OF BILATERAL BREAST IMPLANTS Bilateral 05/17/2014   Procedure: REMOVAL OF BILATERAL TISSUE EXPANDERS WITH PLACEMENT OF BILATERAL BREAST IMPLANTS FOR RECONSTRUCTION;  Surgeon: [name], MD;  Location: [facility];  Service: Plastics;  Laterality: Bilateral;  . TONSILLECTOMY    . TOTAL KNEE ARTHROPLASTY Right 07/26/2018   Procedure: TOTAL KNEE ARTHROPLASTY;  Surgeon: Paralee Cancel, MD;  Location: [facility];  Service: Orthopedics;  Laterality: Right;  70 mins  . TOTAL MASTECTOMY Bilateral 05/23/2013   WITH RECONSTRUCTION  . TOTAL MASTECTOMY Bilateral 05/23/2013   Procedure: TOTAL MASTECTOMY;  Surgeon: [name], MD;  Location: [facility];  Service: General;  Laterality: Bilateral;  . urinary tightening     d/t urinary incontinence     OB History    Gravida  2   Para  2   Term      Preterm      AB      Living        SAB      TAB      Ectopic      Multiple      Live Births           Obstetric Comments  Menarche age 61, first live birth age 82, the patient is [place] P2. Tracey [name] underwent total abdominal hysterectomy and bilateral salpingo-oophorectomy at the age of 73 for endometriosis. Tracey [name] took hormone replacement until August of 2014.         Home Medications    Prior to Admission medications   Medication Sig Start Date End Date Taking? Authorizing Provider  allopurinol (ZYLOPRIM) 100 MG tablet TAKE 1 TABLET BY MOUTH ONCE DAILY Patient taking differently: Take 100 mg by mouth daily.  06/25/16   Magrinat, Virgie Dad, MD  apraclonidine (IOPIDINE) 0.5 % ophthalmic solution Place 1 drop into the right eye daily as needed (for droopy eye).  07/13/18   [provider]  ASPIRIN LOW DOSE 81 MG chewable tablet Chew 81 mg by mouth daily.  09/19/17   [provider]  Cholecalciferol (VITAMIN D3) 50 MCG (2000 UT) TABS Take 2,000 Units by mouth daily.    [provider]  clopidogrel  (PLAVIX) 75 MG tablet Take 75 mg by mouth daily.  09/30/17   [provider]  colchicine 0.6 MG tablet TAKE 1 TABLET BY MOUTH EVERY DAY OR AS NEEDED Patient taking differently: Take 0.6 mg by mouth daily as needed (for gout flare up).  05/27/17   Magrinat, Virgie Dad, MD  cyanocobalamin (,VITAMIN B-12,) 1000 MCG/ML injection Inject 1,000 mcg into the muscle every 30 (thirty) days.    [provider]  cyclobenzaprine (FLEXERIL) 10 MG tablet Take 1 tablet (10 mg total) by mouth 3 (three)  times daily as needed for muscle spasms. 07/26/18   Danae Orleans, PA-C  docusate sodium (COLACE) 100 MG capsule Take 1 capsule (100 mg total) by mouth 2 (two) times daily. 07/26/18   Danae Orleans, PA-C  exemestane (AROMASIN) 25 MG tablet Take 1 tablet (25 mg total) by mouth daily after breakfast. 12/13/17   Magrinat, Virgie Dad, MD  ezetimibe (ZETIA) 10 MG tablet Take 1 tablet (10 mg total) by mouth daily. 05/02/18 04/27/19  Richardson Dopp T, PA-C  ferrous sulfate (FERROUSUL) 325 (65 FE) MG tablet Take 1 tablet (325 mg total) by mouth 3 (three) times daily with meals. 07/26/18   Danae Orleans, PA-C  fluticasone (FLONASE) 50 MCG/ACT nasal spray Place 1 spray into both nostrils daily.  01/18/13   [provider]  furosemide (LASIX) 40 MG tablet Take 40 mg by mouth daily as needed for fluid.  04/01/13   [provider]  HYDROcodone-acetaminophen (NORCO) 7.5-325 MG tablet Take 1-2 tablets by mouth every 4 (four) hours as needed for moderate pain. 07/26/18   Danae Orleans, PA-C  HYDROcodone-acetaminophen (NORCO) 7.5-325 MG tablet Take 1-2 tablets by mouth every 4 (four) hours as needed for moderate pain. 07/31/18   Danae Orleans, PA-C  lisinopril (PRINIVIL,ZESTRIL) 10 MG tablet Take 10 mg by mouth daily.  10/15/17   [provider]  LORazepam (ATIVAN) 0.5 MG tablet Take 0.5 mg by mouth at bedtime as needed for anxiety or sleep.     [provider]  metoprolol succinate  (TOPROL XL) 25 MG 24 hr tablet Take 1 tablet (25 mg total) by mouth daily. 07/04/18 06/29/19  Sherren Mocha, MD  pantoprazole (PROTONIX) 40 MG tablet Take 1 tablet (40 mg total) by mouth daily. 01/14/18 01/09/19  Sherren Mocha, MD  polyethylene glycol [facility] / Floria Raveling) packet Take 17 g by mouth 2 (two) times daily. 07/26/18   Danae Orleans, PA-C  rosuvastatin (CRESTOR) 20 MG tablet Take 1 tablet (20 mg total) by mouth daily. 01/25/18 01/20/19  Sherren Mocha, MD  venlafaxine XR (EFFEXOR-XR) 150 MG 24 hr capsule TAKE 1 CAPSULE BY MOUTH DAILY WITH BREAKFAST Patient taking differently: Take 150 mg by mouth daily with breakfast.  07/09/18   Magrinat, Virgie Dad, MD  venlafaxine XR (EFFEXOR-XR) 37.5 MG 24 hr capsule TAKE 1 CAPSULE(37.5 MG) BY MOUTH DAILY Patient taking differently: Take 37.5 mg by mouth daily as needed (for anxiety/hot flashes).  05/06/18   Magrinat, Virgie Dad, MD    Family History Family History  Problem Relation Age of Onset  . Breast cancer Mother        possible inflammatory breast cancer  . Heart attack Father   . Heart disease Father   . ALS Sister   . Breast cancer Maternal Grandmother 33  . Heart attack Maternal Grandfather   . Heart attack Paternal Grandfather   . Breast cancer Maternal Aunt        dx in her 51s  . Breast cancer Other        maternal great grandmother; dx in her 76s    Social History Social History   Tobacco Use  . Smoking status: Former Smoker    Packs/day: 1.00    Years: 20.00    Pack years: 20.00    Start date: 06/09/1992  . Smokeless tobacco: Never Used  . Tobacco comment: quit at age 70  Substance Use Topics  . Alcohol use: Yes    Alcohol/week: 5.0 standard drinks    Types: 5 Glasses of wine per week  Comment: nightly; 07-21-18 hasnt had any alcohol in a week   . Drug use: No    Comment: quit 24 years ago     Allergies   Ivp dye [iodinated diagnostic agents]; Sulfa antibiotics; and Tape   Review of Systems Review of  Systems  Constitutional: Negative for chills and fever.  Eyes: Negative for visual disturbance.  Respiratory: Positive for shortness of breath. Negative for cough and chest tightness.   Cardiovascular: Positive for chest pain and leg swelling. Negative for palpitations.  Gastrointestinal: Positive for nausea and vomiting. Negative for abdominal pain.  Genitourinary: Negative for dysuria, flank pain and frequency.  Musculoskeletal: Positive for arthralgias. Negative for back pain, neck pain and neck stiffness.  Skin: Positive for wound. Negative for rash.  Neurological: Negative for dizziness, weakness, light-headedness, numbness and headaches.  Psychiatric/Behavioral: The patient is nervous/anxious.   All other systems reviewed and are negative.    Physical Exam Updated Vital Signs BP 102/62   Pulse (!) 110   Temp 97.9 F (36.6 C) (Oral)   Resp 17   SpO2 100%   Physical Exam Vitals signs and nursing note reviewed.  Constitutional:      Appearance: Tracey [name] is well-developed.     Comments: Anxious appearing  HENT:     Head: Normocephalic and atraumatic.     Nose: Nose normal.     Mouth/Throat:     Mouth: Mucous membranes are moist.  Eyes:     Extraocular Movements: Extraocular movements intact.     Pupils: Pupils are equal, round, and reactive to light.  Neck:     Musculoskeletal: Normal range of motion and neck supple. No neck rigidity or muscular tenderness.  Cardiovascular:     Rate and Rhythm: Regular rhythm.     Comments: Tachycardia Pulmonary:     Effort: Pulmonary effort is normal. No respiratory distress.     Breath sounds: Normal breath sounds. No stridor. No wheezing or rales.  Abdominal:     General: Bowel sounds are normal.     Palpations: Abdomen is soft.     Tenderness: There is no abdominal tenderness. There is no guarding or rebound.  Musculoskeletal: Normal range of motion.        General: No tenderness.     Comments: Right knee surgical incision appears  well-healing.  No erythema or warmth.  Right lower extremity superficial veins appear more distended than left.  Mild dark discoloration of the skin of the right lower extremity compared to the left.  2+ dorsalis pedis and posterior tibial pulses.  No calf tenderness bilaterally.  Lymphadenopathy:     Cervical: No cervical adenopathy.  Skin:    General: Skin is warm and dry.     Findings: No erythema or rash.  Neurological:     General: No focal deficit present.     Mental Status: Tracey [name] is alert and oriented to person, place, and time.     Comments: Moving all extremities without focal deficit.  Sensation intact.  Psychiatric:        Behavior: Behavior normal.      ED Treatments / Results  Labs (all labs ordered are listed, but only abnormal results are displayed) Labs Reviewed  BASIC METABOLIC PANEL - Abnormal; Notable for the following components:      Result Value   CO2 18 (*)    Glucose, Bld 101 (*)    Creatinine, Ser 1.31 (*)    GFR calc non Af Amer 40 (*)    GFR  calc Af Amer 47 (*)    Anion gap 18 (*)    All other components within normal limits  CBC - Abnormal; Notable for the following components:   Platelets 450 (*)    All other components within normal limits  D-DIMER, QUANTITATIVE (NOT AT [facility])  CBC  BASIC METABOLIC PANEL  APTT  PROTIME-INR  I-STAT TROPONIN, ED    EKG EKG Interpretation  Date/Time:  Tuesday August 16 2018 20:15:08 EST Ventricular Rate:  128 PR Interval:  130 QRS Duration: 66 QT Interval:  300 QTC Calculation: 438 R Axis:   50 Text Interpretation:  Sinus tachycardia Possible Left atrial enlargement Borderline ECG Confirmed by Julianne Rice [phone]) on 08/16/2018 8:56:36 PM   Radiology Dg Chest Port 1 View  Result Date: 08/16/2018 CLINICAL DATA:  Shortness of breath this evening EXAM: PORTABLE CHEST 1 VIEW COMPARISON:  May 16, 2013 FINDINGS: The heart size and mediastinal contours are stable. Coronary artery stent is noted.  Prominent right pericardial fat pad is unchanged compared prior exam. Multiple surgical clips are projected over the bilateral mid to lower chest. There is no focal infiltrate, pulmonary edema, or pleural effusion. The visualized skeletal structures are unremarkable. IMPRESSION: No acute cardiopulmonary disease identified. Electronically Signed   By: Abelardo Diesel M.D.   On: 08/16/2018 21:11    Procedures Procedures (including critical care time)  Medications Ordered in ED Medications  ondansetron (ZOFRAN) injection 4 mg (has no administration in time range)  sodium chloride 0.9 % bolus 500 mL (has no administration in time range)  aspirin chewable tablet 324 mg (has no administration in time range)  enoxaparin (LOVENOX) injection 1 mg/kg (has no administration in time range)  sodium chloride flush (NS) 0.9 % injection 3 mL (has no administration in time range)  ondansetron (ZOFRAN) tablet 4 mg (has no administration in time range)    Or  ondansetron (ZOFRAN) injection 4 mg (has no administration in time range)  acetaminophen (TYLENOL) tablet 650 mg (has no administration in time range)    Or  acetaminophen (TYLENOL) suppository 650 mg (has no administration in time range)  albuterol (PROVENTIL) (2.5 MG/3ML) 0.083% nebulizer solution 2.5 mg (has no administration in time range)     Initial Impression / Assessment and Plan / ED Course  I have reviewed the triage vital signs and the nursing notes.  Pertinent labs & imaging results that were available during my care of the patient were reviewed by me and considered in my medical decision making (see chart for details).    Patient is high risk for DVT/PE.  Tracey [name] has an allergy to IV dye.  States Tracey [name] has a rash with this.  Chest x-ray without acute findings.  EKG demonstrates sinus tachycardia.  Troponins normal.  Discussed with hospitalist.  Will go ahead and start Lovenox and start on prophylaxis protocol for IV dye.  Plan on CT tomorrow or  ultrasound of the leg.  Patient's chest pain is improved.  Tachycardia has resolved.  Given full dose aspirin.   Final Clinical Impressions(s) / ED Diagnoses   Final diagnoses:  Nonspecific chest pain  Tachycardia    ED Discharge Orders    None       Julianne Rice, MD 08/16/18 2152

## 2018-08-16 NOTE — Progress Notes (Signed)
ANTICOAGULATION CONSULT NOTE - Initial Consult  Pharmacy Consult for Lovenox Indication: r/o VTE  Allergies  Allergen Reactions  . Ivp Dye [Iodinated Diagnostic Agents] Hives  . Sulfa Antibiotics Swelling  . Tape Other (See Comments)    Unknown; has paper thin skin ; ok to use paper tape     Patient Measurements:  62" 64.9 kg  Vital Signs: Temp: 97.9 F (36.6 C) (12/31 2018) Temp Source: Oral (12/31 2018) BP: 102/62 (12/31 2100) Pulse Rate: 110 (12/31 2100)  Labs: Recent Labs    08/16/18 2030  HGB 12.7  HCT 38.6  PLT 450*  CREATININE 1.31*    CrCl cannot be calculated (Unknown ideal weight.).   Medical History: Past Medical History:  Diagnosis Date  . Allergy   . Anxiety    takes Ativan daily prn  . Arthritis   . Breast cancer ([place]) 2014   ER+/PR+/HEr2-,   . Bruises easily   . Colon polyps    2 polyps by report  . Diverticulosis   . GERD (gastroesophageal reflux disease)    occasionally takes Nexium   . Gout    takes Allopurinol daily and Colchicine daily prn;last attack 33yr ago  . Gout   . History of bladder infections    many yrs ago  . History of bronchitis    last time at least 814yrago  . History of stress incontinence   . Hyperlipidemia    takes Pravastatin daily  . Insomnia    takes Ambien nightly prn  . NSTEMI (non-ST elevated myocardial infarction) ([facility]/2019  . OSA (obstructive sleep apnea)    on CPAP  . Osteoarthritis   . Peripheral edema    takes Furosemide daily prn  . PONV (postoperative nausea and vomiting)   . Postmenopausal hormone therapy   . Radiation 07/27/13-09/07/13   Right Breast x 31 treatments  . Rhinitis    uses Flonase prn  . Sinus congestion   . Status post breast reconstruction 10/15/14   [place]  . Tachycardia    takes Metoprolol daily  . Ventricular tachycardia, non-sustained ([facility]   during sleep study 2009 with normal cardiac workup    Medications:   See electronic list  Assessment: [age]/o female who presented to the ED with chest pain. Of note, she had recent knee surgery. Pharmacy consulted to begin Lovenox to r/o VTE. CTA chest and LE dopplers pending. No bleeding noted, CBC is normal. SCr 1.31, CrCl ~39 ml/min. No AC noted PTA.  Goal of Therapy:  Anti-Xa level 0.6-1 units/ml 4hrs after LMWH dose given Monitor platelets by anticoagulation protocol: Yes   Plan:  - Lovenox 65 mg SQ q12h - CBC q72h while on Lovenox - Monitor for s/sx of bleeding - F/U CTA chest and LE doppler results   JeRenold GentaPharmD, BCPS Clinical Pharmacist Clinical phone for 08/16/2018 until 10p is [phone] 08/16/2018 9:51 PM  **Pharmacist phone directory can now be found on [facility] listed under [facility]

## 2018-08-16 NOTE — H&P (Signed)
History and Physical    Tracey [name]:[id number] DOB: 1946/05/23 DOA: 08/16/2018  Referring MD/NP/PA: Julianne Rice, MD PCP: [name], MD  Patient coming from: home via EMS  Chief Complaint: Chest pain  I have personally briefly reviewed patient's old medical records in [place]   HPI: Tracey [name] is a 72 y.o. female with medical history significant of CAD s/p DES of LAD in 08/2017, breast cancer s/p radiation, GERD, gout, s/p total right knee replacement on 07/26/2018; who presents with complaints of acute onset of centralized chest pain.  She describes pain as similar to her heart attack earlier this year.  Noted associated symptoms heavy breathing, anxiety, nausea, several episodes of vomiting, diarrhea, right leg swelling, and complaints of fevers earlier in the day up to 100.6 F.  Patient has been taking Tylenol for pain symptoms.  Following her knee replacement she had been on hydrocodone, but it caused hallucinations.  She has been off this medication for 1 week and recently weaned herself off of tramadol over the weekend.  She reports otherwise being in her normal state of health prior to onset of symptoms and her right knee had been healing without any issues. Denies any recent sick contacts, blood in stool/emesis, loss of consciousness, or hemoptysis.  Following the procedure  ED Course: Upon admission into the emergency department patient was noted to be afebrile, heart rates 10 9-1 26, respirations 17-26, blood pressures maintained, O2 saturations are present on room air.  Labs revealed platelets 450, CO2 18, BUN 15, creatinine 1.31, glucose 101, anion gap 18, and trop 0.  EKG did not show significant ischemic changes.  Chest x-ray showed no acute abnormalities.  Patient had a contrast allergy and therefore CT angiogram was not readily able to be obtained.  Patient was given 324 mg of aspirin to chew and Zofran.  Review of Systems  Constitutional: Positive for  fever. Negative for weight loss.  HENT: Negative for congestion and nosebleeds.   Eyes: Negative for double vision and photophobia.  Respiratory: Positive for shortness of breath. Negative for hemoptysis.   Cardiovascular: Positive for chest pain and leg swelling.  Gastrointestinal: Positive for diarrhea, nausea and vomiting. Negative for blood in stool.  Genitourinary: Negative for dysuria and hematuria.  Musculoskeletal: Positive for joint pain. Negative for falls.  Skin: Negative for itching.  Neurological: Negative for focal weakness and loss of consciousness.  Psychiatric/Behavioral: Negative for substance abuse. The patient is nervous/anxious.     Past Medical History:  Diagnosis Date  . Allergy   . Anxiety    takes Ativan daily prn  . Arthritis   . Breast cancer ([place]) 2014   ER+/PR+/HEr2-,   . Bruises easily   . Colon polyps    2 polyps by report  . Diverticulosis   . GERD (gastroesophageal reflux disease)    occasionally takes Nexium   . Gout    takes Allopurinol daily and Colchicine daily prn;last attack 13yr ago  . Gout   . History of bladder infections    many yrs ago  . History of bronchitis    last time at least 869yrago  . History of stress incontinence   . Hyperlipidemia    takes Pravastatin daily  . Insomnia    takes Ambien nightly prn  . NSTEMI (non-ST elevated myocardial infarction) [place]  . OSA (obstructive sleep apnea)    on CPAP  . Osteoarthritis   . Peripheral edema    takes Furosemide daily  prn  . PONV (postoperative nausea and vomiting)   . Postmenopausal hormone therapy   . Radiation 07/27/13-09/07/13   Right Breast x 31 treatments  . Rhinitis    uses Flonase prn  . Sinus congestion   . Status post breast reconstruction 10/15/14   Bilateral implant removal and DIEP performed in [place], [place]  . Tachycardia    takes Metoprolol daily  . Ventricular tachycardia, non-sustained ([place])    during sleep study 2009 with normal cardiac workup      Past Surgical History:  Procedure Laterality Date  . ABDOMINAL HYSTERECTOMY    . APPENDECTOMY    . AXILLARY SENTINEL NODE BIOPSY Right 05/23/2013   Procedure: AXILLARY SENTINEL NODE BIOPSY;  Surgeon: [name], MD;  Location: [facility];  Service: General;  Laterality: Right;  nuc med injection 7:00  . bilateral cataract surgery    . BREAST RECONSTRUCTION Bilateral 05/17/2014   W  . BREAST RECONSTRUCTION WITH PLACEMENT OF TISSUE EXPANDER AND FLEX HD (ACELLULAR HYDRATED DERMIS) Bilateral 05/23/2013   Procedure: BILATERAL BREAST RECONSTRUCTION WITH PLACEMENT OF TISSUE EXPANDER AND FLEX HD;  Surgeon: [name], MD;  Location: [facility];  Service: Plastics;  Laterality: Bilateral;  . CARDIAC CATHETERIZATION  1999  . CHOLECYSTECTOMY    . COLONOSCOPY    . COSMETIC SURGERY    . CYSTECTOMY  09/2017   had cyst removal from spine   . GALLBLADDER SURGERY    . PERCUTANEOUS CORONARY STENT INTERVENTION (PCI-S)  08/2017   done in [place]    . REMOVAL OF BILATERAL TISSUE EXPANDERS WITH PLACEMENT OF BILATERAL BREAST IMPLANTS Bilateral 05/17/2014   Procedure: REMOVAL OF BILATERAL TISSUE EXPANDERS WITH PLACEMENT OF BILATERAL BREAST IMPLANTS FOR RECONSTRUCTION;  Surgeon: [name], MD;  Location: [facility];  Service: Plastics;  Laterality: Bilateral;  . TONSILLECTOMY    . TOTAL KNEE ARTHROPLASTY Right 07/26/2018   Procedure: TOTAL KNEE ARTHROPLASTY;  Surgeon: Paralee Cancel, MD;  Location: [facility];  Service: Orthopedics;  Laterality: Right;  70 mins  . TOTAL MASTECTOMY Bilateral 05/23/2013   WITH RECONSTRUCTION  . TOTAL MASTECTOMY Bilateral 05/23/2013   Procedure: TOTAL MASTECTOMY;  Surgeon: [name], MD;  Location: [facility];  Service: General;  Laterality: Bilateral;  . urinary tightening     d/t urinary incontinence     reports that she has quit smoking. She started smoking about 26 years ago. She has a 20.00 pack-year smoking history. She has never used smokeless tobacco. She reports current  alcohol use of about 5.0 standard drinks of alcohol per week. She reports that she does not use drugs.  Allergies  Allergen Reactions  . Ivp Dye [Iodinated Diagnostic Agents] Hives  . Sulfa Antibiotics Swelling  . Tape Other (See Comments)    Unknown; has paper thin skin ; ok to use paper tape     Family History  Problem Relation Age of Onset  . Breast cancer Mother        possible inflammatory breast cancer  . Heart attack Father   . Heart disease Father   . ALS Sister   . Breast cancer Maternal Grandmother 71  . Heart attack Maternal Grandfather   . Heart attack Paternal Grandfather   . Breast cancer Maternal Aunt        dx in her 6s  . Breast cancer Other        maternal great grandmother; dx in her 37s    Prior to Admission medications   Medication Sig Start Date End Date  Taking? Authorizing Provider  allopurinol (ZYLOPRIM) 100 MG tablet TAKE 1 TABLET BY MOUTH ONCE DAILY Patient taking differently: Take 100 mg by mouth daily.  06/25/16   Magrinat, Virgie Dad, MD  apraclonidine (IOPIDINE) 0.5 % ophthalmic solution Place 1 drop into the right eye daily as needed (for droopy eye).  07/13/18   [provider]  ASPIRIN LOW DOSE 81 MG chewable tablet Chew 81 mg by mouth daily.  09/19/17   [provider]  Cholecalciferol (VITAMIN D3) 50 MCG (2000 UT) TABS Take 2,000 Units by mouth daily.    [provider]  clopidogrel (PLAVIX) 75 MG tablet Take 75 mg by mouth daily.  09/30/17   [provider]  colchicine 0.6 MG tablet TAKE 1 TABLET BY MOUTH EVERY DAY OR AS NEEDED Patient taking differently: Take 0.6 mg by mouth daily as needed (for gout flare up).  05/27/17   Magrinat, Virgie Dad, MD  cyanocobalamin (,VITAMIN B-12,) 1000 MCG/ML injection Inject 1,000 mcg into the muscle every 30 (thirty) days.    [provider]  cyclobenzaprine (FLEXERIL) 10 MG tablet Take 1 tablet (10 mg total) by mouth 3 (three) times daily as needed for muscle spasms.  07/26/18   Danae Orleans, PA-C  docusate sodium (COLACE) 100 MG capsule Take 1 capsule (100 mg total) by mouth 2 (two) times daily. 07/26/18   Danae Orleans, PA-C  exemestane (AROMASIN) 25 MG tablet Take 1 tablet (25 mg total) by mouth daily after breakfast. 12/13/17   Magrinat, Virgie Dad, MD  ezetimibe (ZETIA) 10 MG tablet Take 1 tablet (10 mg total) by mouth daily. 05/02/18 04/27/19  Richardson Dopp T, PA-C  ferrous sulfate (FERROUSUL) 325 (65 FE) MG tablet Take 1 tablet (325 mg total) by mouth 3 (three) times daily with meals. 07/26/18   Danae Orleans, PA-C  fluticasone (FLONASE) 50 MCG/ACT nasal spray Place 1 spray into both nostrils daily.  01/18/13   [provider]  furosemide (LASIX) 40 MG tablet Take 40 mg by mouth daily as needed for fluid.  04/01/13   [provider]  HYDROcodone-acetaminophen (NORCO) 7.5-325 MG tablet Take 1-2 tablets by mouth every 4 (four) hours as needed for moderate pain. 07/26/18   Danae Orleans, PA-C  HYDROcodone-acetaminophen (NORCO) 7.5-325 MG tablet Take 1-2 tablets by mouth every 4 (four) hours as needed for moderate pain. 07/31/18   Danae Orleans, PA-C  lisinopril (PRINIVIL,ZESTRIL) 10 MG tablet Take 10 mg by mouth daily.  10/15/17   [provider]  LORazepam (ATIVAN) 0.5 MG tablet Take 0.5 mg by mouth at bedtime as needed for anxiety or sleep.     [provider]  metoprolol succinate (TOPROL XL) 25 MG 24 hr tablet Take 1 tablet (25 mg total) by mouth daily. 07/04/18 06/29/19  Sherren Mocha, MD  pantoprazole (PROTONIX) 40 MG tablet Take 1 tablet (40 mg total) by mouth daily. 01/14/18 01/09/19  Sherren Mocha, MD  polyethylene glycol [facility] / Floria Raveling) packet Take 17 g by mouth 2 (two) times daily. 07/26/18   Danae Orleans, PA-C  rosuvastatin (CRESTOR) 20 MG tablet Take 1 tablet (20 mg total) by mouth daily. 01/25/18 01/20/19  Sherren Mocha, MD  venlafaxine XR (EFFEXOR-XR) 150 MG 24 hr capsule TAKE 1 CAPSULE BY MOUTH DAILY  WITH BREAKFAST Patient taking differently: Take 150 mg by mouth daily with breakfast.  07/09/18   Magrinat, Virgie Dad, MD  venlafaxine XR (EFFEXOR-XR) 37.5 MG 24 hr capsule TAKE 1 CAPSULE(37.5 MG) BY MOUTH DAILY Patient taking differently: Take 37.5  mg by mouth daily as needed (for anxiety/hot flashes).  05/06/18   Magrinat, Virgie Dad, MD    Physical Exam:  Constitutional: Elderly female who appears to be no acute distress able to follow Vitals:   08/16/18 2018 08/16/18 2045 08/16/18 2046 08/16/18 2100  BP: (!) 109/53 (!) 139/55 116/62 102/62  Pulse: (!) 126 (!) 113 (!) 109 (!) 110  Resp: (!) _0 Temp: 97.9 F (36.6 C)     TempSrc: Oral     SpO2: 100% 100% 100% 100%   Eyes: PERRL, lids and conjunctivae normal ENMT: Mucous membranes are moist. Posterior pharynx clear of any exudate or lesions.  Neck: normal, supple, no masses, no thyromegaly Respiratory: clear to auscultation bilaterally, no wheezing, no crackles. Normal respiratory effort. No accessory muscle use.  Cardiovascular: Tachycardic, no murmurs / rubs / gallops.  Mild swelling of the right lower extremity. Abdomen: no tenderness, no masses palpated. No hepatosplenomegaly. Bowel sounds positive.  Musculoskeletal: no clubbing / cyanosis. No joint deformity upper and lower extremities. Good ROM, no contractures. Normal muscle tone.  Skin: Routine healing noted at the right knee. Neurologic: CN 2-12 grossly intact. Sensation intact, DTR normal. Strength 5/5 in all 4.  Psychiatric: Normal judgment and insight. Alert and oriented x 3.  Anxious mood.     Labs on Admission: I have personally reviewed following labs and imaging studies  CBC: Recent Labs  Lab 08/16/18 2030  WBC 9.8  HGB 12.7  HCT 38.6  MCV 91.3  PLT 009*   Basic Metabolic Panel: Recent Labs  Lab 08/16/18 2030  NA 142  K 4.5  CL 106  CO2 18*  GLUCOSE 101*  BUN 15  CREATININE 1.31*  CALCIUM 10.2   GFR: CrCl cannot be calculated (Unknown  ideal weight.). Liver Function Tests: No results for input(s): AST, ALT, ALKPHOS, BILITOT, PROT, ALBUMIN in the last 168 hours. No results for input(s): LIPASE, AMYLASE in the last 168 hours. No results for input(s): AMMONIA in the last 168 hours. Coagulation Profile: No results for input(s): INR, PROTIME in the last 168 hours. Cardiac Enzymes: No results for input(s): CKTOTAL, CKMB, CKMBINDEX, TROPONINI in the last 168 hours. BNP (last 3 results) No results for input(s): PROBNP in the last 8760 hours. HbA1C: No results for input(s): HGBA1C in the last 72 hours. CBG: No results for input(s): GLUCAP in the last 168 hours. Lipid Profile: No results for input(s): CHOL, HDL, LDLCALC, TRIG, CHOLHDL, LDLDIRECT in the last 72 hours. Thyroid Function Tests: No results for input(s): TSH, T4TOTAL, FREET4, T3FREE, THYROIDAB in the last 72 hours. Anemia Panel: No results for input(s): VITAMINB12, FOLATE, FERRITIN, TIBC, IRON, RETICCTPCT in the last 72 hours. Urine analysis: No results found for: COLORURINE, APPEARANCEUR, LABSPEC, PHURINE, GLUCOSEU, HGBUR, BILIRUBINUR, KETONESUR, PROTEINUR, UROBILINOGEN, NITRITE, LEUKOCYTESUR Sepsis Labs: No results found for this or any previous visit (from the past 240 hour(s)).   Radiological Exams on Admission: Dg Chest Port 1 View  Result Date: 08/16/2018 CLINICAL DATA:  Shortness of breath this evening EXAM: PORTABLE CHEST 1 VIEW COMPARISON:  May 16, 2013 FINDINGS: The heart size and mediastinal contours are stable. Coronary artery stent is noted. Prominent right pericardial fat pad is unchanged compared prior exam. Multiple surgical clips are projected over the bilateral mid to lower chest. There is no focal infiltrate, pulmonary edema, or pleural effusion. The visualized skeletal structures are unremarkable. IMPRESSION: No acute cardiopulmonary disease identified. Electronically Signed   By: Abelardo Diesel M.D.   On: 08/16/2018  21:11    EKG:  Independently reviewed.  Sinus tachycardia at 128 bpm signs of left atrial abnormality  Assessment/Plan Chest pain: Acute.  Patient presents with acute onset of chest pain with shortness of breath, nausea, vomiting, and diarrhea.  Initial vital signs noted tachycardia.  Physical exam reveals right leg swelling.  Given patient status post right knee surgery on 12/10, elevated d-dimer, and medication use have high suspicion for DVT/PE - Admit to a telemetry bed  - Continuous pulse oximetry with nasal cannula oxygen as needed - Strict bedrest - Lovenox VTE treatment dose per pharmacy - Check echocardiogram in a.m. - Check Doppler ultrasound of the lower extremities - Check CT angiogram chest with contrast allergy protocol - Consult cardiology if needed in a.m.  CAD: Patient with NSTEMI in 08/2017 status post drug-eluting stent to the LAD. - Continue Plavix and aspirin  Acute kidney injury: Patient baseline creatinine previously noted to be 0.94 on 12/12.  But presents with a creatinine of 1.31 with BUN 15. - IV fluids of normal saline at 75 mL/h as tolerated - Hold nephrotoxic agents - Recheck BMP in a.m.  Metabolic acidosis with elevated anion gap: Patient presents with CO2 18, initial glucose 101, and anion gap 18. - Follow-up repeat blood work in a.m.  Nausea, vomiting, diarrhea: Acute.  Unclear cause of symptoms at this time could be related to chest pain versus other. - Monitor intake and output - Check urinalysis - Symptomatic treatment with antiemetics   Thrombocytosis: Acute.  Initial platelet count mildly elevated at 450.  Suspect reactive in nature to above. - Recheck CBC in a.m.  History of breast cancer: Of note risk factors of medication include thromboembolism - Continue exemetane   Essential hypertension - Continue metoprolol  - Hold hydrochlorothiazide and lisinopril  DVT prophylaxis: Enoxaparin pharmacy Code Status: Full Family Communication: Discussed plan of  care with the patient and family at bedside Disposition Plan: Likely discharge home once medically stable Consults called: None Admission status: observation  Norval Morton MD [facility] Pager [phone]   If 7PM-7AM, please contact night-coverage [email or web address] Password [facility]  08/16/2018, 9:39 PM

## 2018-08-17 ENCOUNTER — Observation Stay (HOSPITAL_COMMUNITY): Payer: Medicare Other

## 2018-08-17 ENCOUNTER — Observation Stay (HOSPITAL_BASED_OUTPATIENT_CLINIC_OR_DEPARTMENT_OTHER): Payer: Medicare Other

## 2018-08-17 DIAGNOSIS — M25561 Pain in right knee: Secondary | ICD-10-CM | POA: Diagnosis not present

## 2018-08-17 DIAGNOSIS — Z96651 Presence of right artificial knee joint: Secondary | ICD-10-CM | POA: Diagnosis present

## 2018-08-17 DIAGNOSIS — I10 Essential (primary) hypertension: Secondary | ICD-10-CM | POA: Diagnosis present

## 2018-08-17 DIAGNOSIS — E872 Acidosis, unspecified: Secondary | ICD-10-CM | POA: Diagnosis present

## 2018-08-17 DIAGNOSIS — Z882 Allergy status to sulfonamides status: Secondary | ICD-10-CM | POA: Diagnosis not present

## 2018-08-17 DIAGNOSIS — I2510 Atherosclerotic heart disease of native coronary artery without angina pectoris: Secondary | ICD-10-CM | POA: Diagnosis not present

## 2018-08-17 DIAGNOSIS — Z923 Personal history of irradiation: Secondary | ICD-10-CM | POA: Diagnosis not present

## 2018-08-17 DIAGNOSIS — F419 Anxiety disorder, unspecified: Secondary | ICD-10-CM | POA: Diagnosis present

## 2018-08-17 DIAGNOSIS — R739 Hyperglycemia, unspecified: Secondary | ICD-10-CM | POA: Diagnosis present

## 2018-08-17 DIAGNOSIS — R079 Chest pain, unspecified: Secondary | ICD-10-CM | POA: Diagnosis not present

## 2018-08-17 DIAGNOSIS — Z7902 Long term (current) use of antithrombotics/antiplatelets: Secondary | ICD-10-CM | POA: Diagnosis not present

## 2018-08-17 DIAGNOSIS — Z87891 Personal history of nicotine dependence: Secondary | ICD-10-CM | POA: Diagnosis not present

## 2018-08-17 DIAGNOSIS — E860 Dehydration: Secondary | ICD-10-CM | POA: Diagnosis not present

## 2018-08-17 DIAGNOSIS — I252 Old myocardial infarction: Secondary | ICD-10-CM | POA: Diagnosis not present

## 2018-08-17 DIAGNOSIS — R112 Nausea with vomiting, unspecified: Secondary | ICD-10-CM | POA: Diagnosis present

## 2018-08-17 DIAGNOSIS — N179 Acute kidney failure, unspecified: Secondary | ICD-10-CM | POA: Diagnosis present

## 2018-08-17 DIAGNOSIS — R7301 Impaired fasting glucose: Secondary | ICD-10-CM | POA: Diagnosis not present

## 2018-08-17 DIAGNOSIS — J189 Pneumonia, unspecified organism: Secondary | ICD-10-CM | POA: Diagnosis not present

## 2018-08-17 DIAGNOSIS — Z91041 Radiographic dye allergy status: Secondary | ICD-10-CM | POA: Diagnosis not present

## 2018-08-17 DIAGNOSIS — M7989 Other specified soft tissue disorders: Secondary | ICD-10-CM

## 2018-08-17 DIAGNOSIS — Z955 Presence of coronary angioplasty implant and graft: Secondary | ICD-10-CM | POA: Diagnosis not present

## 2018-08-17 DIAGNOSIS — Z91048 Other nonmedicinal substance allergy status: Secondary | ICD-10-CM | POA: Diagnosis not present

## 2018-08-17 DIAGNOSIS — K219 Gastro-esophageal reflux disease without esophagitis: Secondary | ICD-10-CM | POA: Diagnosis present

## 2018-08-17 DIAGNOSIS — G4733 Obstructive sleep apnea (adult) (pediatric): Secondary | ICD-10-CM | POA: Diagnosis present

## 2018-08-17 DIAGNOSIS — R791 Abnormal coagulation profile: Secondary | ICD-10-CM | POA: Diagnosis present

## 2018-08-17 DIAGNOSIS — R197 Diarrhea, unspecified: Secondary | ICD-10-CM | POA: Diagnosis present

## 2018-08-17 DIAGNOSIS — Z9013 Acquired absence of bilateral breasts and nipples: Secondary | ICD-10-CM | POA: Diagnosis not present

## 2018-08-17 DIAGNOSIS — Z9882 Breast implant status: Secondary | ICD-10-CM | POA: Diagnosis not present

## 2018-08-17 DIAGNOSIS — M109 Gout, unspecified: Secondary | ICD-10-CM | POA: Diagnosis present

## 2018-08-17 LAB — CBC
HCT: 35.6 % — ABNORMAL LOW (ref 36.0–46.0)
Hemoglobin: 11.5 g/dL — ABNORMAL LOW (ref 12.0–15.0)
MCH: 30 pg (ref 26.0–34.0)
MCHC: 32.3 g/dL (ref 30.0–36.0)
MCV: 93 fL (ref 80.0–100.0)
PLATELETS: 381 10*3/uL (ref 150–400)
RBC: 3.83 MIL/uL — AB (ref 3.87–5.11)
RDW: 14.2 % (ref 11.5–15.5)
WBC: 7.4 10*3/uL (ref 4.0–10.5)
nRBC: 0 % (ref 0.0–0.2)

## 2018-08-17 LAB — BASIC METABOLIC PANEL
Anion gap: 14 (ref 5–15)
BUN: 16 mg/dL (ref 8–23)
CO2: 19 mmol/L — ABNORMAL LOW (ref 22–32)
Calcium: 9.6 mg/dL (ref 8.9–10.3)
Chloride: 107 mmol/L (ref 98–111)
Creatinine, Ser: 1.09 mg/dL — ABNORMAL HIGH (ref 0.44–1.00)
GFR calc Af Amer: 58 mL/min — ABNORMAL LOW (ref >60)
GFR, EST NON AFRICAN AMERICAN: 50 mL/min — AB (ref >60)
Glucose, Bld: 125 mg/dL — ABNORMAL HIGH (ref 70–99)
Potassium: 4.3 mmol/L (ref 3.5–5.1)
Sodium: 140 mmol/L (ref 135–145)

## 2018-08-17 LAB — PROTIME-INR
INR: 1.12
Prothrombin Time: 14.3 seconds (ref 11.4–15.2)

## 2018-08-17 LAB — STREP PNEUMONIAE URINARY ANTIGEN: Strep Pneumo Urinary Antigen: NEGATIVE

## 2018-08-17 LAB — URINALYSIS, ROUTINE W REFLEX MICROSCOPIC
BACTERIA UA: NONE SEEN
Bilirubin Urine: NEGATIVE
Glucose, UA: NEGATIVE mg/dL
Hgb urine dipstick: NEGATIVE
KETONES UR: NEGATIVE mg/dL
Nitrite: NEGATIVE
Protein, ur: NEGATIVE mg/dL
Specific Gravity, Urine: 1.023 (ref 1.005–1.030)
pH: 5 (ref 5.0–8.0)

## 2018-08-17 LAB — TROPONIN I: Troponin I: <0.03 ng/mL (ref <0.03)

## 2018-08-17 LAB — BRAIN NATRIURETIC PEPTIDE: B Natriuretic Peptide: 108.8 pg/mL — ABNORMAL HIGH (ref 0.0–100.0)

## 2018-08-17 LAB — APTT: APTT: 37 s — AB (ref 24–36)

## 2018-08-17 IMAGING — CT CT ANGIO CHEST
2 of 6 series · 18 of 36 positions shown · IV contrast (iopamidol)
Comparison: Chest radiograph performed [DATE]

CLINICAL DATA: Acute onset of generalized chest pain, dizziness and
nausea.

EXAM:
CT ANGIOGRAPHY CHEST WITH CONTRAST
TECHNIQUE: Multidetector CT imaging of the chest was performed using the
standard protocol during bolus administration of intravenous
contrast. Multiplanar CT image reconstructions and MIPs were
obtained to evaluate the vascular anatomy.
CONTRAST:  100mL [MG] IOPAMIDOL ([MG]) INJECTION 76%

[Series 7: pe thins · axial · 0.79mm/px · z∈[+1054,+1255]mm · 17 of 322 slices shown]
[im 17/322  lung]
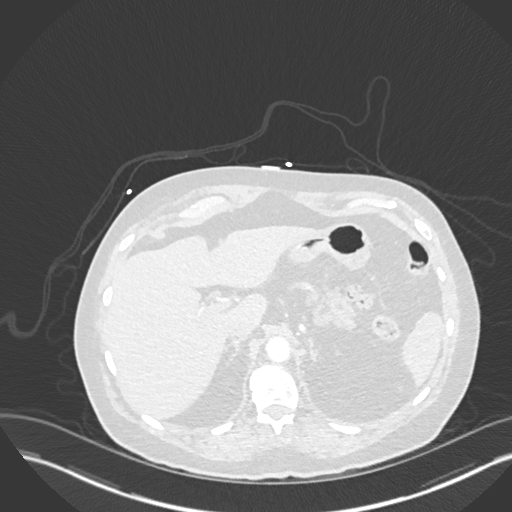
[im 33/322  mediastinal]
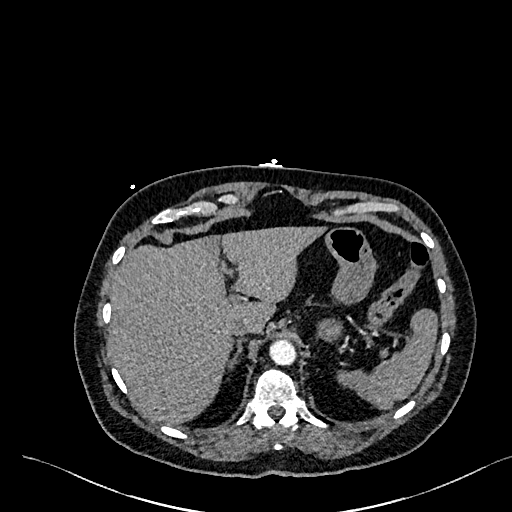
[im 49/322  lung]
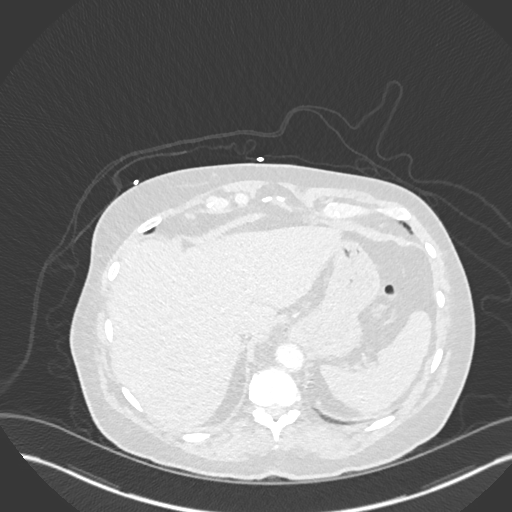
[im 65/322  mediastinal]
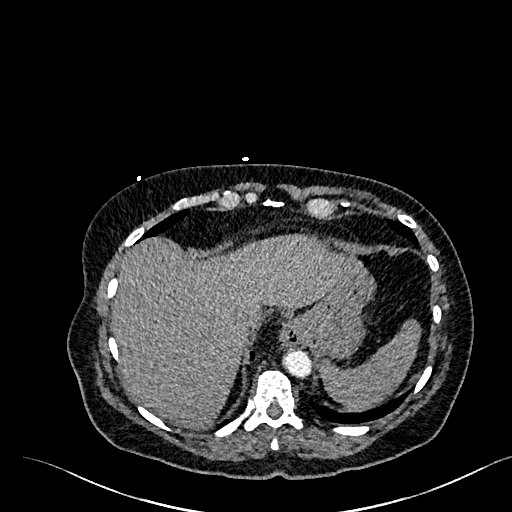
[im 97/322  lung]
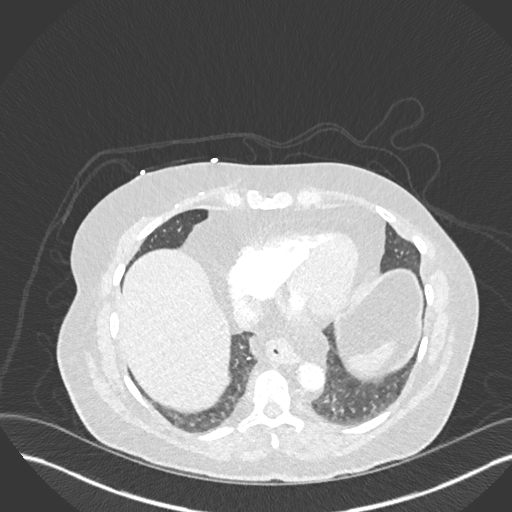
[im 113/322  mediastinal]
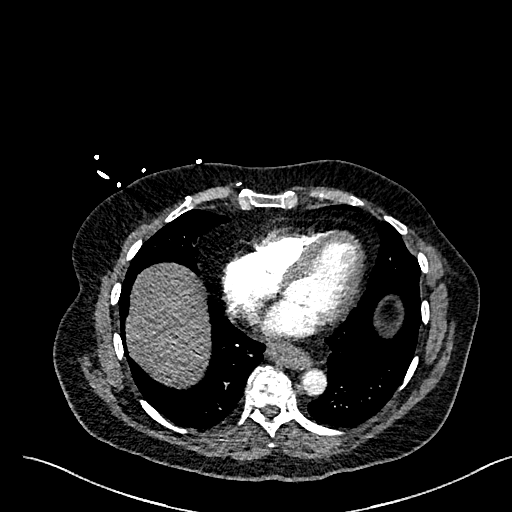
[im 129/322  lung]
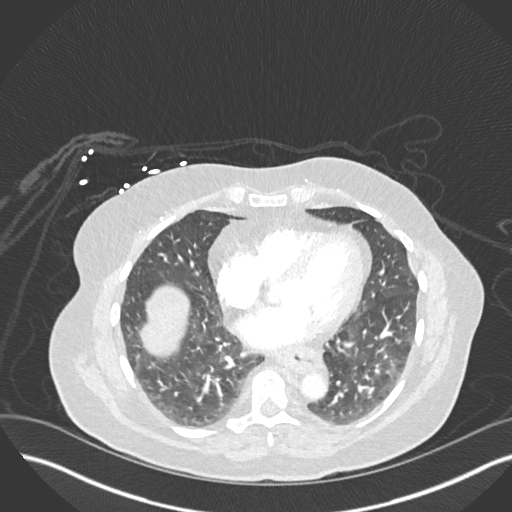
[im 145/322  mediastinal]
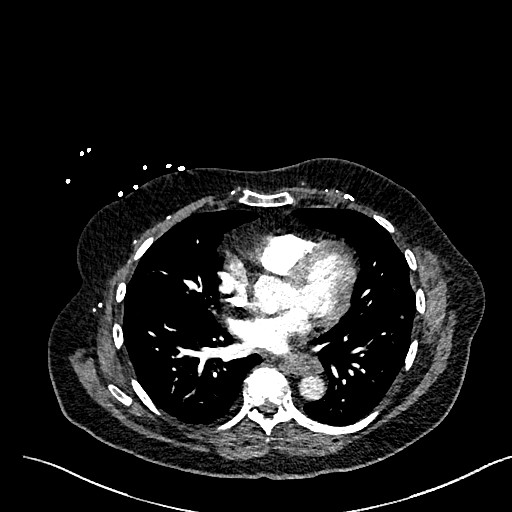
[im 161/322  lung]
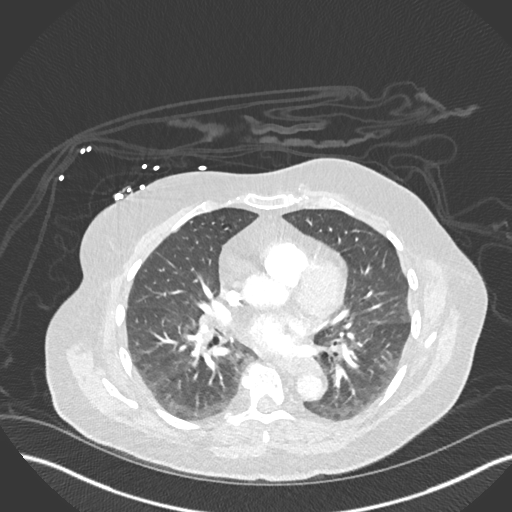
[im 177/322  mediastinal]
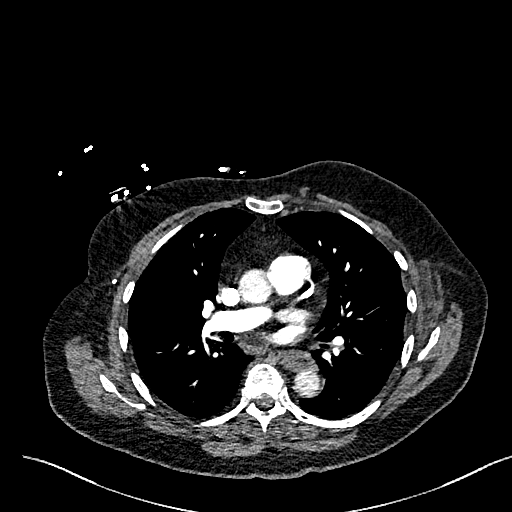
[im 193/322  lung]
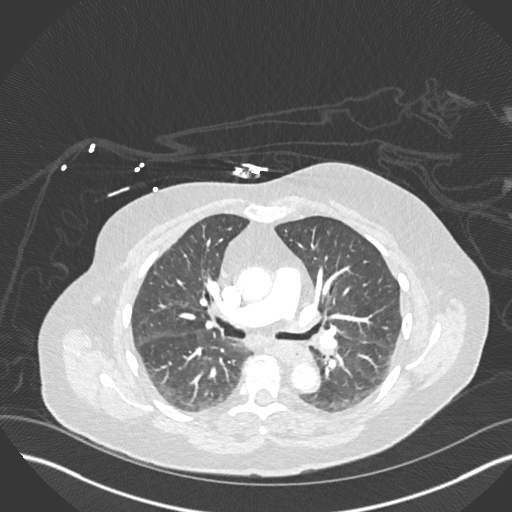
[im 209/322  mediastinal]
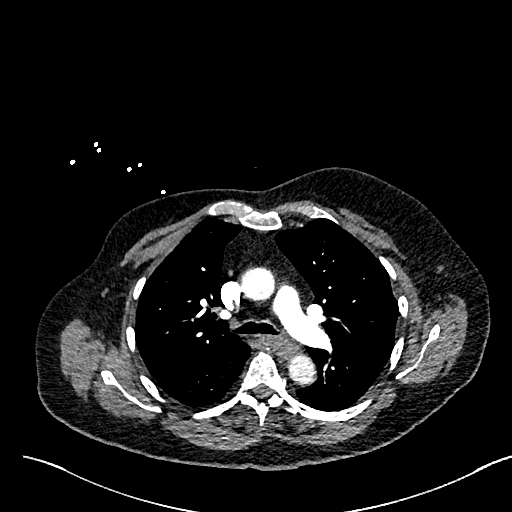
[im 225/322  lung]
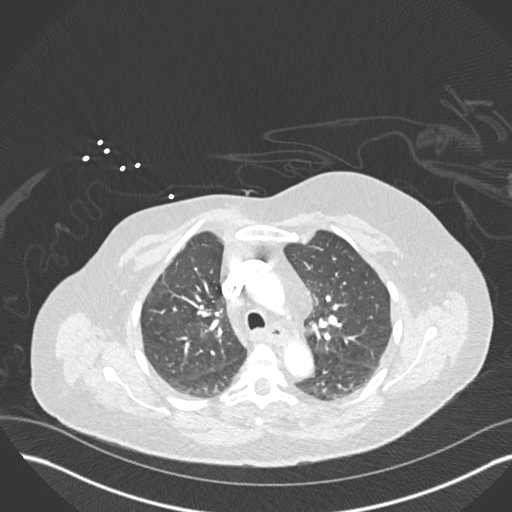
[im 257/322  mediastinal]
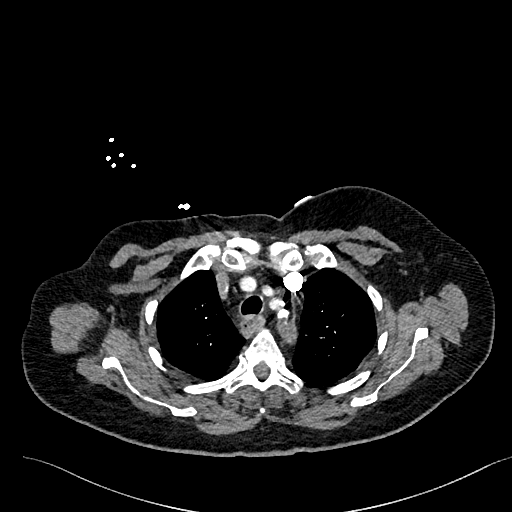
[im 273/322  lung]
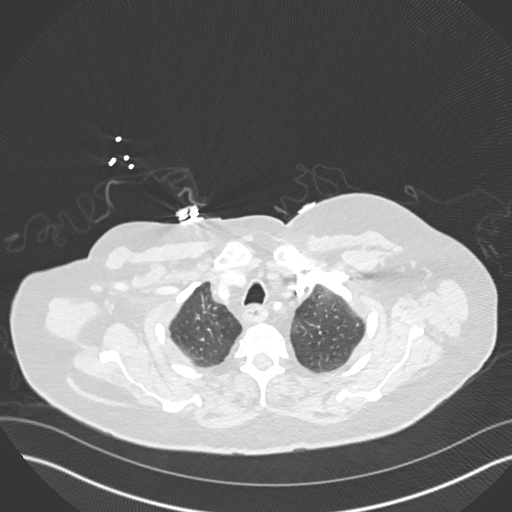
[im 289/322  mediastinal]
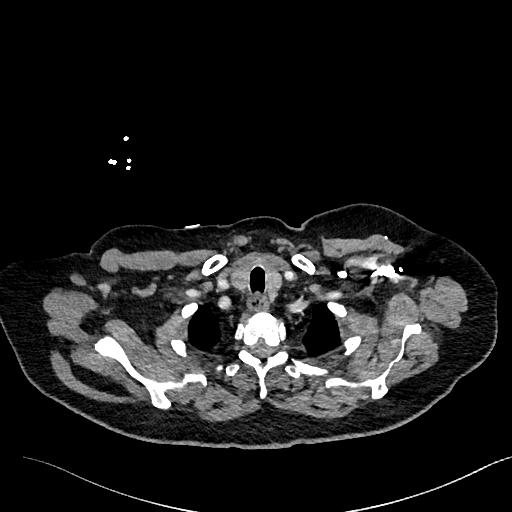
[im 305/322  lung]
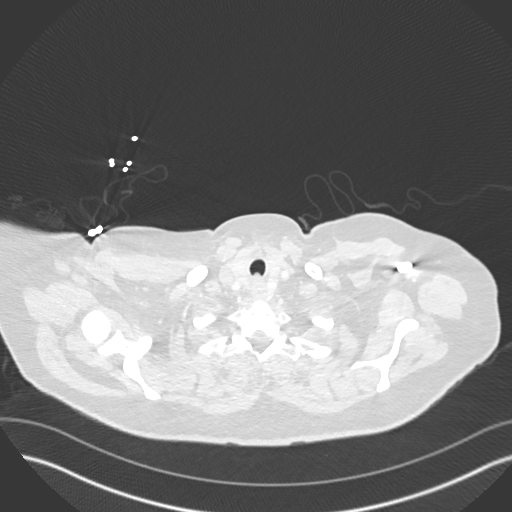

[Series 8: pe 2mm cor · coronal · 0.46mm/px · 1 of 122 slices shown]
[im 61/122  mediastinal]
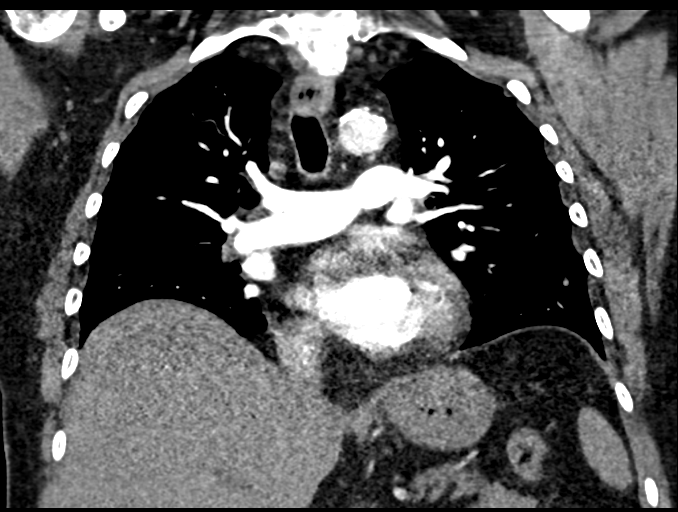

[18 of 36 positions shown; findings below may reference images not displayed]

FINDINGS: Cardiovascular:  There is no evidence of pulmonary embolus.

The heart is normal in size. Scattered coronary artery
calcifications are seen. Calcification is noted along the aortic
arch and proximal great vessels.

Mediastinum/Nodes: Visualized mediastinal nodes remain borderline
normal in size. No pericardial effusion is identified. The
visualized portions of the thyroid gland are unremarkable. No
axillary lymphadenopathy is appreciated.

Diffuse wall thickening is noted along the mid to distal esophagus,
concerning for esophagitis.

Lungs/Pleura: Minimal peripheral opacities are noted at the lung
bases. These may reflect atelectasis or possibly mild infection. No
pleural effusion or pneumothorax is seen. No masses are identified.

Upper Abdomen: The visualized portions of the liver and spleen are
unremarkable. The patient is status post cholecystectomy, with clips
noted at the gallbladder fossa.

Musculoskeletal: No acute osseous abnormalities are identified. The
visualized musculature is unremarkable in appearance.

Review of the MIP images confirms the above findings.
IMPRESSION: 1. No evidence of pulmonary embolus.
2. Minimal peripheral opacities at the lung bases. These may reflect
atelectasis or possibly mild infection.
3. Diffuse wall thickening along the mid to distal esophagus,
concerning for esophagitis.
4. Scattered coronary artery calcifications noted.

## 2018-08-17 MED ORDER — GUAIFENESIN-DM 100-10 MG/5ML PO SYRP: 5.0000 mL | ORAL_SOLUTION | ORAL | Status: DC | PRN

## 2018-08-17 MED ORDER — IOPAMIDOL (ISOVUE-370) INJECTION 76%
INTRAVENOUS | Status: AC
  Filled 2018-08-17: qty 100

## 2018-08-17 MED ORDER — IOPAMIDOL (ISOVUE-370) INJECTION 76%
100.0000 mL | Freq: Once | INTRAVENOUS | Status: AC | PRN
  Administered 2018-08-17: 100 mL via INTRAVENOUS

## 2018-08-17 MED ORDER — SODIUM CHLORIDE 0.9 % IV SOLN
1.0000 g | INTRAVENOUS | Status: DC
  Administered 2018-08-17 – 2018-08-18 (×2): 1 g via INTRAVENOUS
  Filled 2018-08-17 (×2): qty 10

## 2018-08-17 MED ORDER — SODIUM CHLORIDE 0.9 % IV SOLN
500.0000 mg | INTRAVENOUS | Status: DC
  Administered 2018-08-17 – 2018-08-18 (×2): 500 mg via INTRAVENOUS
  Filled 2018-08-17 (×2): qty 500

## 2018-08-17 MED ORDER — ENOXAPARIN SODIUM 40 MG/0.4ML ~~LOC~~ SOLN
40.0000 mg | SUBCUTANEOUS | Status: DC
  Administered 2018-08-17: 40 mg via SUBCUTANEOUS
  Filled 2018-08-17 (×3): qty 0.4

## 2018-08-17 MED ORDER — BENZONATATE 100 MG PO CAPS
100.0000 mg | ORAL_CAPSULE | Freq: Three times a day (TID) | ORAL | Status: DC | PRN
  Filled 2018-08-17 (×2): qty 1

## 2018-08-17 NOTE — ED Notes (Signed)
Patient transported to Ultrasound 

## 2018-08-17 NOTE — Progress Notes (Addendum)
Progress Note    Tracey [name]  KVQ:[id number] DOB: 1945/08/28  DOA: 08/16/2018 PCP: [name], MD    Brief Narrative:     Medical records reviewed and are as summarized below:  Tracey [name] is an 73 y.o. 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  CTA negative for PE but suggestive of possible PNA.  Assessment/Plan:   Principal Problem:   Chest pain Active Problems:   Breast cancer of upper-outer quadrant of right female breast (HCC)   CAD (coronary artery disease), native coronary artery   Hypertension   Metabolic acidosis with increased anion gap and accumulation of organic acids   Nausea, vomiting, and diarrhea   AKI (acute kidney injury) (HCC)  Cough and chest pain due to Pneumonia - status post right knee surgery on 12/10, elevated d-dimer-- duplex as well as CTA negative for PE -  Doppler ultrasound of the lower extremities negative - CT angiogram chest negative for PE -troponin negative- check BNP -strep pneumonia antigen pending  CAD: Patient with NSTEMI in 08/2017 status post drug-eluting stent to the LAD. - Continue Plavix and aspirin  Acute kidney injury: Patient baseline creatinine previously noted to be 0.94 on 12/12.  But presents with a creatinine of 1.31 with BUN 15. - IVF - Hold nephrotoxic agents - Cr improved overnight  Metabolic acidosis with elevated anion gap: Patient presents with CO2 18, initial glucose 101, and anion gap 18. -repeat BMP this AM with gap of 14  Thrombocytosis: Acute.  Initial platelet count mildly elevated at 450.  Suspect reactive in nature to above. - Resolved  History of breast cancer - Continue exemetane   Essential hypertension - Continue metoprolol  - Hold hydrochlorothiazide and lisinopril   Family  Communication/Anticipated D/C date and plan/Code Status   DVT prophylaxis: Lovenox ordered. Code Status: Full Code.  Family Communication: at bedside Disposition Plan: IV abx started today-- if much improved, may d/c in AM   Medical Consultants:    None.     Subjective:   + cough-- currently non-productive  Objective:    Vitals:   08/17/18 0011 08/17/18 0327 08/17/18 0430 08/17/18 0846  BP: (!) 136/58 (!) 146/70 (!) 147/84 (!) 148/61  Pulse: 85 84 88 100  Resp: 14 18 (!) 22 20  Temp:      TempSrc:      SpO2: 97% 100% 100% 100%    Intake/Output Summary (Last 24 hours) at 08/17/2018 1209 Last data filed at 08/16/2018 2301 Gross per 24 hour  Intake 500 ml  Output -  Net 500 ml   There were no vitals filed for this visit.  Exam: In bed, NAD rrr Diminished at bases b/l, no wheezing, good air movement throughout the rest of the lung fields +BS, soft, NT No LE edema No rashes or lesion  Data Reviewed:   I have personally reviewed following labs and imaging studies:  Labs: Labs show the following:   Basic Metabolic Panel: Recent Labs  Lab 08/16/18 2030 08/17/18 0339  NA 142 140  K 4.5 4.3  CL 106 107  CO2 18* 19*  GLUCOSE 101* 125*  BUN 15 16  CREATININE 1.31* 1.09*  CALCIUM 10.2 9.6   GFR CrCl cannot be calculated (Unknown ideal weight.). Liver Function Tests: No results for input(s): AST,  ALT, ALKPHOS, BILITOT, PROT, ALBUMIN in the last 168 hours. No results for input(s): LIPASE, AMYLASE in the last 168 hours. No results for input(s): AMMONIA in the last 168 hours. Coagulation profile Recent Labs  Lab 08/17/18 0339  INR 1.12    CBC: Recent Labs  Lab 08/16/18 2030 08/17/18 0339  WBC 9.8 7.4  HGB 12.7 11.5*  HCT 38.6 35.6*  MCV 91.3 93.0  PLT 450* 381   Cardiac Enzymes: No results for input(s): CKTOTAL, CKMB, CKMBINDEX, TROPONINI in the last 168 hours. BNP (last 3 results) No results for input(s): PROBNP in the last 8760  hours. CBG: No results for input(s): GLUCAP in the last 168 hours. D-Dimer: Recent Labs    08/16/18 2050  DDIMER 12.38*   Hgb A1c: No results for input(s): HGBA1C in the last 72 hours. Lipid Profile: No results for input(s): CHOL, HDL, LDLCALC, TRIG, CHOLHDL, LDLDIRECT in the last 72 hours. Thyroid function studies: No results for input(s): TSH, T4TOTAL, T3FREE, THYROIDAB in the last 72 hours.  Invalid input(s): FREET3 Anemia work up: No results for input(s): VITAMINB12, FOLATE, FERRITIN, TIBC, IRON, RETICCTPCT in the last 72 hours. Sepsis Labs: Recent Labs  Lab 08/16/18 2030 08/17/18 0339  WBC 9.8 7.4    Microbiology No results found for this or any previous visit (from the past 240 hour(s)).  Procedures and diagnostic studies:  Ct Angio Chest Pe W Or Wo Contrast  Result Date: 08/17/2018 CLINICAL DATA:  Acute onset of generalized chest pain, dizziness and nausea. EXAM: CT ANGIOGRAPHY CHEST WITH CONTRAST TECHNIQUE: Multidetector CT imaging of the chest was performed using the standard protocol during bolus administration of intravenous contrast. Multiplanar CT image reconstructions and MIPs were obtained to evaluate the vascular anatomy. CONTRAST:  140mL ISOVUE-370 IOPAMIDOL (ISOVUE-370) INJECTION 76% COMPARISON:  Chest radiograph performed 08/16/2018 FINDINGS: Cardiovascular:  There is no evidence of pulmonary embolus. The heart is normal in size. Scattered coronary artery calcifications are seen. Calcification is noted along the aortic arch and proximal great vessels. Mediastinum/Nodes: Visualized mediastinal nodes remain borderline normal in size. No pericardial effusion is identified. The visualized portions of the thyroid gland are unremarkable. No axillary lymphadenopathy is appreciated. Diffuse wall thickening is noted along the mid to distal esophagus, concerning for esophagitis. Lungs/Pleura: Minimal peripheral opacities are noted at the lung bases. These may reflect  atelectasis or possibly mild infection. No pleural effusion or pneumothorax is seen. No masses are identified. Upper Abdomen: The visualized portions of the liver and spleen are unremarkable. The patient is status post cholecystectomy, with clips noted at the gallbladder fossa. Musculoskeletal: No acute osseous abnormalities are identified. The visualized musculature is unremarkable in appearance. Review of the MIP images confirms the above findings. IMPRESSION: 1. No evidence of pulmonary embolus. 2. Minimal peripheral opacities at the lung bases. These may reflect atelectasis or possibly mild infection. 3. Diffuse wall thickening along the mid to distal esophagus, concerning for esophagitis. 4. Scattered coronary artery calcifications noted. Electronically Signed   By: Garald Balding M.D.   On: 08/17/2018 05:58   Dg Chest Port 1 View  Result Date: 08/16/2018 CLINICAL DATA:  Shortness of breath this evening EXAM: PORTABLE CHEST 1 VIEW COMPARISON:  May 16, 2013 FINDINGS: The heart size and mediastinal contours are stable. Coronary artery stent is noted. Prominent right pericardial fat pad is unchanged compared prior exam. Multiple surgical clips are projected over the bilateral mid to lower chest. There is no focal infiltrate, pulmonary edema, or pleural effusion. The visualized skeletal structures are  unremarkable. IMPRESSION: No acute cardiopulmonary disease identified. Electronically Signed   By: Abelardo Diesel M.D.   On: 08/16/2018 21:11   Vas [place] Lower Extremity Venous (dvt)  Result Date: 08/17/2018  Lower Venous Study Indications: Swelling.  Performing Technologist: Abram Sander RVS  Examination Guidelines: A complete evaluation includes B-mode imaging, spectral Doppler, color Doppler, and power Doppler as needed of all accessible portions of each vessel. Bilateral testing is considered an integral part of a complete examination. Limited examinations for reoccurring indications may be performed as  noted.  Right Venous Findings: +---------+---------------+---------+-----------+----------+-------+          CompressibilityPhasicitySpontaneityPropertiesSummary +---------+---------------+---------+-----------+----------+-------+ CFV      Full           Yes      Yes                          +---------+---------------+---------+-----------+----------+-------+ SFJ      Full                                                 +---------+---------------+---------+-----------+----------+-------+ FV Prox  Full                                                 +---------+---------------+---------+-----------+----------+-------+ FV Mid   Full                                                 +---------+---------------+---------+-----------+----------+-------+ FV DistalFull                                                 +---------+---------------+---------+-----------+----------+-------+ PFV      Full                                                 +---------+---------------+---------+-----------+----------+-------+ POP      Full           Yes      Yes                          +---------+---------------+---------+-----------+----------+-------+ PTV      Full                                                 +---------+---------------+---------+-----------+----------+-------+ PERO     Full                                                 +---------+---------------+---------+-----------+----------+-------+  Left Venous Findings: +---+---------------+---------+-----------+----------+-------+    CompressibilityPhasicitySpontaneityPropertiesSummary +---+---------------+---------+-----------+----------+-------+ CFVFull  Yes      Yes                          +---+---------------+---------+-----------+----------+-------+    Summary: Right: There is no evidence of deep vein thrombosis in the lower extremity. No cystic structure found in the popliteal  fossa. Left: No evidence of common femoral vein obstruction.  *See table(s) above for measurements and observations. Electronically signed by Servando Snare MD on 08/17/2018 at 11:16:35 AM.    Final     Medications:   . allopurinol  100 mg Oral Daily  . aspirin  81 mg Oral Daily  . clopidogrel  75 mg Oral Daily  . enoxaparin (LOVENOX) injection  40 mg Subcutaneous Q24H  . exemestane  25 mg Oral QPC breakfast  . ezetimibe  10 mg Oral Daily  . iopamidol      . metoprolol succinate  25 mg Oral Q24H  . pantoprazole  40 mg Oral Daily  . predniSONE  50 mg Oral Q6H  . rosuvastatin  20 mg Oral Daily  . sodium chloride flush  3 mL Intravenous Q12H  . venlafaxine XR  150 mg Oral Q breakfast   Continuous Infusions: . sodium chloride 75 mL/hr at 08/17/18 0838  . azithromycin 500 mg (08/17/18 1140)  . cefTRIAXone (ROCEPHIN)  IV 1 g (08/17/18 1143)     LOS: 0 days   Geradine Girt  [facility]   *Please refer to [email or web address], password [email or web address] to get updated schedule on who will round on this patient, as hospitalists switch teams weekly. If 7PM-7AM, please contact night-coverage at [email or web address], password [email or web address] for any overnight needs.  08/17/2018, 12:09 PM

## 2018-08-17 NOTE — ED Notes (Signed)
Dr Eliseo Squires at bedside

## 2018-08-17 NOTE — ED Notes (Signed)
Ordered breakfast tray, diet heart healthy  

## 2018-08-17 NOTE — ED Notes (Signed)
Breakfast at bedside.

## 2018-08-17 NOTE — Progress Notes (Signed)
*  Preliminary Results* Right lower extremity venous duplex completed. Right lower extremity is negative for deep vein thrombosis. There is no evidence of right Baker's cyst.  08/17/2018 9:20 AM  Abram Sander

## 2018-08-18 DIAGNOSIS — J189 Pneumonia, unspecified organism: Principal | ICD-10-CM

## 2018-08-18 DIAGNOSIS — R7301 Impaired fasting glucose: Secondary | ICD-10-CM

## 2018-08-18 DIAGNOSIS — E860 Dehydration: Secondary | ICD-10-CM

## 2018-08-18 DIAGNOSIS — M25561 Pain in right knee: Secondary | ICD-10-CM | POA: Diagnosis not present

## 2018-08-18 MED ORDER — BENZONATATE 100 MG PO CAPS: 100.0000 mg | ORAL_CAPSULE | Freq: Three times a day (TID) | ORAL | 0 refills | Status: DC | PRN

## 2018-08-18 MED ORDER — AZITHROMYCIN 500 MG PO TABS: 500.0000 mg | ORAL_TABLET | Freq: Every day | ORAL | 0 refills | Status: AC

## 2018-08-18 MED ORDER — CEFUROXIME AXETIL 500 MG PO TABS: 500.0000 mg | ORAL_TABLET | Freq: Two times a day (BID) | ORAL | 0 refills | Status: AC

## 2018-08-18 NOTE — Discharge Summary (Addendum)
Physician Discharge Summary  Tracey [name] ENI:[id number] DOB: [birth year] DOA: 08/16/2018  PCP: [name], MD  Admit date: 08/16/2018 Discharge date: 08/18/2018  Recommendations for Outpatient Follow-up:   Community-acquired pneumonia --Resolution of pneumonia  Fasting hyperglycemia in the context of acute pneumonia and recently surgery. --Minimal in nature.  Fasting glucose this morning 127.  Consider outpatient follow-up.  Follow-up Information    [name], MD. Schedule an appointment as soon as possible for a visit in 1 week(s).   Specialty:  Internal Medicine Contact information: [place] [address] [phone]        Sherren Mocha, MD .   Specialty:  Cardiology Contact information: [phone] [address] [phone]            Discharge Diagnoses: Principal diagnosis is #1 1. Community-acquired pneumonia 2. Elevated d-dimer, recent surgery 3. CAD PMH non-STEMI January 2019 4. Dehydration 5. Fasting hyperglycemia in the context of acute pneumonia and recently surgery.  Discharge Condition: improved Disposition: home  Diet recommendation: heart healthy  History of present illness:  73 year old woman PMH CAD, NSTEMI January 2019, breast cancer status post treatment, total right knee replacement July 26, 2018, presented with cough, chest pain, lightheadedness, reported fever.  Hospital Course:  Patient was admitted for further evaluation of chest pain.  Troponins were negative EKG nonacute.  Ruled out for ACS.  Further investigation with CT chest suggested pneumonia and this correlates with the patient's symptoms of fever and increased fatigue at home as well as cough.  She was treated empirically with antibiotics with clinical improvement.  No hypoxia.  Also reports some lightheadedness prior to admission, suggestive of dehydration, correlates with initial BMP.  Stable for  discharge.  Community-acquired pneumonia --Taking together CT findings and clinical history, continue to treat pneumonia, total 5 days.  Hospitalization was uncomplicated.  Elevated d-dimer, recent surgery --CTA chest was negative for PE and Doppler of the right lower extremity and left femoral vein was negative for DVT.  She is asymptomatic as far as her legs go.  Elevated d-dimer likely secondary to recent surgery and in the context of recent surgery is not useful.  CAD PMH non-STEMI January 2019 --ACS ruled out.  Continue Plavix, aspirin.  Dehydration.  Resolved with IV fluids.  This likely accounts for her lightheadedness as well.  Note ketones were present in urine.  Fasting hyperglycemia in the context of acute pneumonia and recently surgery. --Minimal in nature.  Fasting glucose this morning 127.  Suggest outpatient follow-up.  Today's assessment: S: Feels much better today.  Some cough.  Breathing better.  Dizziness seems to have be resolved at this point although she has not walked this morning.  She reports that she had some fever at home and felt more fatigued for 2 days prior to admission as well as having a cough. O: Vitals:  Vitals:   08/17/18 2035 08/18/18 0514  BP: (!) 141/65 (!) 130/59  Pulse: 83 79  Resp: 17   Temp: 98 F (36.7 C) 98 F (36.7 C)  SpO2: 100% 99%    Constitutional:  . Appears calm and comfortable lying flat in bed Eyes:  . pupils and irises appear normal . Normal lids  ENMT:  . grossly normal hearing  . Lips appear normal Respiratory:  . CTA bilaterally, no w/r/r.  . Respiratory effort normal.  Cardiovascular:  . RRR, no m/r/g . No LE extremity edema   Abdomen:  . Soft, nontender Musculoskeletal:  .  RLE, LLE   . strength and tone grossly normal Skin:  . Incision over right knee is healing well.  There is minimal edema of the right knee and right thigh.  There is no edema or pain with palpation of the left leg. Psychiatric:   . judgement and insight appear normal . Mental status o Mood, affect appropriate  Creatinine improved yesterday to 1.09 and potassium was within normal limits. Troponins were negative.  BNP unremarkable.  CBC unremarkable. Urinalysis was clear.  EKG showed sinus tachycardia.  Chest x-ray showed no acute disease.  CT chest reviewed.  Discharge Instructions  Discharge Instructions    Diet - low sodium heart healthy   Complete by:  As directed    Discharge instructions   Complete by:  As directed    Call your physician or seek immediate medical attention for fever, worsening pain, shortness of breath, vomiting or worsening of condition.   Increase activity slowly   Complete by:  As directed      Allergies as of 08/18/2018      Reactions   Ivp Dye [iodinated Diagnostic Agents] Hives   Sulfa Antibiotics Swelling   Tape Other (See Comments)   Unknown; has paper thin skin ; ok to use paper tape       Medication List    STOP taking these medications   cyclobenzaprine 10 MG tablet Commonly known as:  FLEXERIL   exemestane 25 MG tablet Commonly known as:  AROMASIN   ferrous sulfate 325 (65 FE) MG tablet Commonly known as:  FERROUSUL   HYDROcodone-acetaminophen 7.5-325 MG tablet Commonly known as:  NORCO   polyethylene glycol packet Commonly known as:  MIRALAX / GLYCOLAX     TAKE these medications   allopurinol 100 MG tablet Commonly known as:  ZYLOPRIM TAKE 1 TABLET BY MOUTH ONCE DAILY   apraclonidine 0.5 % ophthalmic solution Commonly known as:  IOPIDINE Place 1 drop into the right eye daily as needed (for droopy eye).   ASPIRIN LOW DOSE 81 MG chewable tablet Generic drug:  aspirin Chew 81 mg by mouth daily.   azithromycin 500 MG tablet Commonly known as:  ZITHROMAX Take 1 tablet (500 mg total) by mouth daily for 3 days. Take 1 tablet daily for 3 days. Start taking on:  August 19, 2018   benzonatate 100 MG capsule Commonly known as:  TESSALON Take 1 capsule  (100 mg total) by mouth 3 (three) times daily as needed for cough.   cefUROXime 500 MG tablet Commonly known as:  CEFTIN Take 1 tablet (500 mg total) by mouth 2 (two) times daily for 3 days. Start taking on:  August 19, 2018   clopidogrel 75 MG tablet Commonly known as:  PLAVIX Take 75 mg by mouth daily.   colchicine 0.6 MG tablet TAKE 1 TABLET BY MOUTH EVERY DAY OR AS NEEDED What changed:  See the new instructions.   cyanocobalamin 1000 MCG/ML injection Commonly known as:  (VITAMIN B-12) Inject 1,000 mcg into the muscle every 30 (thirty) days.   docusate sodium 100 MG capsule Commonly known as:  COLACE Take 1 capsule (100 mg total) by mouth 2 (two) times daily.   ezetimibe 10 MG tablet Commonly known as:  ZETIA Take 1 tablet (10 mg total) by mouth daily.   fluticasone 50 MCG/ACT nasal spray Commonly known as:  FLONASE Place 1 spray into both nostrils daily.   furosemide 40 MG tablet Commonly known as:  LASIX Take 40 mg by mouth daily  as needed for fluid.   lisinopril 10 MG tablet Commonly known as:  PRINIVIL,ZESTRIL Take 10 mg by mouth daily.   metoprolol succinate 25 MG 24 hr tablet Commonly known as:  TOPROL XL Take 1 tablet (25 mg total) by mouth daily.   pantoprazole 40 MG tablet Commonly known as:  PROTONIX Take 1 tablet (40 mg total) by mouth daily.   rosuvastatin 20 MG tablet Commonly known as:  CRESTOR Take 1 tablet (20 mg total) by mouth daily.   venlafaxine XR 37.5 MG 24 hr capsule Commonly known as:  EFFEXOR-XR TAKE 1 CAPSULE(37.5 MG) BY MOUTH DAILY What changed:  See the new instructions.   venlafaxine XR 150 MG 24 hr capsule Commonly known as:  EFFEXOR-XR TAKE 1 CAPSULE BY MOUTH DAILY WITH BREAKFAST What changed:  See the new instructions.   Vitamin D3 50 MCG (2000 UT) Tabs Take 2,000 Units by mouth daily.      Allergies  Allergen Reactions  . Ivp Dye [Iodinated Diagnostic Agents] Hives  . Sulfa Antibiotics Swelling  . Tape Other  (See Comments)    Unknown; has paper thin skin ; ok to use paper tape     The results of significant diagnostics from this hospitalization (including imaging, microbiology, ancillary and laboratory) are listed below for reference.    Significant Diagnostic Studies: Ct Angio Chest Pe W Or Wo Contrast  Result Date: 08/17/2018 CLINICAL DATA:  Acute onset of generalized chest pain, dizziness and nausea. EXAM: CT ANGIOGRAPHY CHEST WITH CONTRAST TECHNIQUE: Multidetector CT imaging of the chest was performed using the standard protocol during bolus administration of intravenous contrast. Multiplanar CT image reconstructions and MIPs were obtained to evaluate the vascular anatomy. CONTRAST:  141mL ISOVUE-370 IOPAMIDOL (ISOVUE-370) INJECTION 76% COMPARISON:  Chest radiograph performed 08/16/2018 FINDINGS: Cardiovascular:  There is no evidence of pulmonary embolus. The heart is normal in size. Scattered coronary artery calcifications are seen. Calcification is noted along the aortic arch and proximal great vessels. Mediastinum/Nodes: Visualized mediastinal nodes remain borderline normal in size. No pericardial effusion is identified. The visualized portions of the thyroid gland are unremarkable. No axillary lymphadenopathy is appreciated. Diffuse wall thickening is noted along the mid to distal esophagus, concerning for esophagitis. Lungs/Pleura: Minimal peripheral opacities are noted at the lung bases. These may reflect atelectasis or possibly mild infection. No pleural effusion or pneumothorax is seen. No masses are identified. Upper Abdomen: The visualized portions of the liver and spleen are unremarkable. The patient is status post cholecystectomy, with clips noted at the gallbladder fossa. Musculoskeletal: No acute osseous abnormalities are identified. The visualized musculature is unremarkable in appearance. Review of the MIP images confirms the above findings. IMPRESSION: 1. No evidence of pulmonary embolus. 2.  Minimal peripheral opacities at the lung bases. These may reflect atelectasis or possibly mild infection. 3. Diffuse wall thickening along the mid to distal esophagus, concerning for esophagitis. 4. Scattered coronary artery calcifications noted. Electronically Signed   By: Garald Balding M.D.   On: 08/17/2018 05:58   Dg Chest Port 1 View  Result Date: 08/16/2018 CLINICAL DATA:  Shortness of breath this evening EXAM: PORTABLE CHEST 1 VIEW COMPARISON:  May 16, 2013 FINDINGS: The heart size and mediastinal contours are stable. Coronary artery stent is noted. Prominent right pericardial fat pad is unchanged compared prior exam. Multiple surgical clips are projected over the bilateral mid to lower chest. There is no focal infiltrate, pulmonary edema, or pleural effusion. The visualized skeletal structures are unremarkable. IMPRESSION: No acute cardiopulmonary disease  identified. Electronically Signed   By: Abelardo Diesel M.D.   On: 08/16/2018 21:11   Vas [place] Lower Extremity Venous (dvt)  Result Date: 08/17/2018  Lower Venous Study Indications: Swelling.  Performing Technologist: Abram Sander RVS  Examination Guidelines: A complete evaluation includes B-mode imaging, spectral Doppler, color Doppler, and power Doppler as needed of all accessible portions of each vessel. Bilateral testing is considered an integral part of a complete examination. Limited examinations for reoccurring indications may be performed as noted.  Right Venous Findings: +---------+---------------+---------+-----------+----------+-------+          CompressibilityPhasicitySpontaneityPropertiesSummary +---------+---------------+---------+-----------+----------+-------+ CFV      Full           Yes      Yes                          +---------+---------------+---------+-----------+----------+-------+ SFJ      Full                                                  +---------+---------------+---------+-----------+----------+-------+ FV Prox  Full                                                 +---------+---------------+---------+-----------+----------+-------+ FV Mid   Full                                                 +---------+---------------+---------+-----------+----------+-------+ FV DistalFull                                                 +---------+---------------+---------+-----------+----------+-------+ PFV      Full                                                 +---------+---------------+---------+-----------+----------+-------+ POP      Full           Yes      Yes                          +---------+---------------+---------+-----------+----------+-------+ PTV      Full                                                 +---------+---------------+---------+-----------+----------+-------+ PERO     Full                                                 +---------+---------------+---------+-----------+----------+-------+  Left Venous Findings: +---+---------------+---------+-----------+----------+-------+    CompressibilityPhasicitySpontaneityPropertiesSummary +---+---------------+---------+-----------+----------+-------+ CFVFull  Yes      Yes                          +---+---------------+---------+-----------+----------+-------+    Summary: Right: There is no evidence of deep vein thrombosis in the lower extremity. No cystic structure found in the popliteal fossa. Left: No evidence of common femoral vein obstruction.  *See table(s) above for measurements and observations. Electronically signed by Servando Snare MD on 08/17/2018 at 11:16:35 AM.    Final    Labs: Basic Metabolic Panel: Recent Labs  Lab 08/16/18 2030 08/17/18 0339  NA 142 140  K 4.5 4.3  CL 106 107  CO2 18* 19*  GLUCOSE 101* 125*  BUN 15 16  CREATININE 1.31* 1.09*  CALCIUM 10.2 9.6   Recent Labs  Lab 08/16/18 2030  08/17/18 0339  WBC 9.8 7.4  HGB 12.7 11.5*  HCT 38.6 35.6*  MCV 91.3 93.0  PLT 450* 381   Cardiac Enzymes: Recent Labs  Lab 08/17/18 1342  TROPONINI <0.03   Recent Labs    08/17/18 1342  BNP 108.8*   Principal Problem:   Chest pain Active Problems:   Breast cancer of upper-outer quadrant of right female breast (HCC)   CAD (coronary artery disease), native coronary artery   Hypertension   Metabolic acidosis with increased anion gap and accumulation of organic acids   Nausea, vomiting, and diarrhea   AKI (acute kidney injury) ([place])   Time coordinating discharge: 40 minutes  Signed:  Murray Hodgkins, MD [facility] 08/18/2018, 11:09 AM

## 2018-08-19 ENCOUNTER — Encounter (HOSPITAL_COMMUNITY): Payer: Self-pay

## 2018-08-22 DIAGNOSIS — M25561 Pain in right knee: Secondary | ICD-10-CM | POA: Diagnosis not present

## 2018-08-23 DIAGNOSIS — K219 Gastro-esophageal reflux disease without esophagitis: Secondary | ICD-10-CM | POA: Diagnosis not present

## 2018-08-23 DIAGNOSIS — M25561 Pain in right knee: Secondary | ICD-10-CM | POA: Diagnosis not present

## 2018-08-23 DIAGNOSIS — I10 Essential (primary) hypertension: Secondary | ICD-10-CM | POA: Diagnosis not present

## 2018-08-23 DIAGNOSIS — I25118 Atherosclerotic heart disease of native coronary artery with other forms of angina pectoris: Secondary | ICD-10-CM | POA: Diagnosis not present

## 2018-08-23 DIAGNOSIS — J189 Pneumonia, unspecified organism: Secondary | ICD-10-CM | POA: Diagnosis not present

## 2018-08-23 DIAGNOSIS — M25562 Pain in left knee: Secondary | ICD-10-CM | POA: Diagnosis not present

## 2018-08-23 DIAGNOSIS — Z6824 Body mass index (BMI) 24.0-24.9, adult: Secondary | ICD-10-CM | POA: Diagnosis not present

## 2018-08-24 ENCOUNTER — Encounter (HOSPITAL_COMMUNITY): Payer: Self-pay

## 2018-08-24 DIAGNOSIS — M25561 Pain in right knee: Secondary | ICD-10-CM | POA: Diagnosis not present

## 2018-08-26 ENCOUNTER — Encounter (HOSPITAL_COMMUNITY): Payer: Self-pay

## 2018-08-26 DIAGNOSIS — M25561 Pain in right knee: Secondary | ICD-10-CM | POA: Diagnosis not present

## 2018-08-29 ENCOUNTER — Other Ambulatory Visit: Payer: Self-pay | Admitting: Oncology

## 2018-08-30 DIAGNOSIS — M25561 Pain in right knee: Secondary | ICD-10-CM | POA: Diagnosis not present

## 2018-08-31 ENCOUNTER — Encounter (HOSPITAL_COMMUNITY): Payer: Self-pay

## 2018-09-01 DIAGNOSIS — G5781 Other specified mononeuropathies of right lower limb: Secondary | ICD-10-CM | POA: Diagnosis not present

## 2018-09-01 DIAGNOSIS — Z471 Aftercare following joint replacement surgery: Secondary | ICD-10-CM | POA: Diagnosis not present

## 2018-09-01 DIAGNOSIS — M25562 Pain in left knee: Secondary | ICD-10-CM | POA: Diagnosis not present

## 2018-09-01 DIAGNOSIS — M1712 Unilateral primary osteoarthritis, left knee: Secondary | ICD-10-CM | POA: Diagnosis not present

## 2018-09-01 DIAGNOSIS — M25561 Pain in right knee: Secondary | ICD-10-CM | POA: Diagnosis not present

## 2018-09-02 ENCOUNTER — Encounter (HOSPITAL_COMMUNITY): Payer: Self-pay

## 2018-09-07 ENCOUNTER — Encounter (HOSPITAL_COMMUNITY): Payer: Self-pay

## 2018-09-08 DIAGNOSIS — Z96651 Presence of right artificial knee joint: Secondary | ICD-10-CM | POA: Diagnosis not present

## 2018-09-08 DIAGNOSIS — M1712 Unilateral primary osteoarthritis, left knee: Secondary | ICD-10-CM | POA: Diagnosis not present

## 2018-09-09 ENCOUNTER — Encounter (HOSPITAL_COMMUNITY): Payer: Self-pay

## 2018-09-12 DIAGNOSIS — Z471 Aftercare following joint replacement surgery: Secondary | ICD-10-CM | POA: Diagnosis not present

## 2018-09-12 DIAGNOSIS — Z96651 Presence of right artificial knee joint: Secondary | ICD-10-CM | POA: Diagnosis not present

## 2018-09-14 ENCOUNTER — Encounter (HOSPITAL_COMMUNITY): Payer: Self-pay

## 2018-09-15 DIAGNOSIS — Z471 Aftercare following joint replacement surgery: Secondary | ICD-10-CM | POA: Diagnosis not present

## 2018-09-15 DIAGNOSIS — Z96651 Presence of right artificial knee joint: Secondary | ICD-10-CM | POA: Diagnosis not present

## 2018-09-16 ENCOUNTER — Encounter (HOSPITAL_COMMUNITY): Payer: Self-pay

## 2018-09-21 ENCOUNTER — Encounter (HOSPITAL_COMMUNITY): Payer: Self-pay

## 2018-09-23 ENCOUNTER — Encounter (HOSPITAL_COMMUNITY): Payer: Self-pay

## 2018-09-28 ENCOUNTER — Encounter (HOSPITAL_COMMUNITY): Payer: Self-pay

## 2018-09-30 ENCOUNTER — Encounter (HOSPITAL_COMMUNITY): Payer: Self-pay

## 2018-10-05 ENCOUNTER — Encounter (HOSPITAL_COMMUNITY): Payer: Self-pay

## 2018-10-07 ENCOUNTER — Encounter (HOSPITAL_COMMUNITY): Payer: Self-pay

## 2018-10-12 ENCOUNTER — Encounter (HOSPITAL_COMMUNITY): Payer: Self-pay

## 2018-10-14 ENCOUNTER — Encounter (HOSPITAL_COMMUNITY): Payer: Self-pay

## 2018-10-19 ENCOUNTER — Encounter (HOSPITAL_COMMUNITY): Payer: Self-pay

## 2018-10-19 DIAGNOSIS — I10 Essential (primary) hypertension: Secondary | ICD-10-CM | POA: Diagnosis not present

## 2018-10-19 DIAGNOSIS — Z79899 Other long term (current) drug therapy: Secondary | ICD-10-CM | POA: Diagnosis not present

## 2018-10-19 DIAGNOSIS — I252 Old myocardial infarction: Secondary | ICD-10-CM | POA: Diagnosis not present

## 2018-10-19 DIAGNOSIS — I2510 Atherosclerotic heart disease of native coronary artery without angina pectoris: Secondary | ICD-10-CM | POA: Diagnosis not present

## 2018-10-19 DIAGNOSIS — Z955 Presence of coronary angioplasty implant and graft: Secondary | ICD-10-CM | POA: Diagnosis not present

## 2018-10-19 DIAGNOSIS — G4733 Obstructive sleep apnea (adult) (pediatric): Secondary | ICD-10-CM | POA: Diagnosis not present

## 2018-10-19 DIAGNOSIS — S8001XA Contusion of right knee, initial encounter: Secondary | ICD-10-CM | POA: Diagnosis not present

## 2018-10-19 DIAGNOSIS — Z791 Long term (current) use of non-steroidal anti-inflammatories (NSAID): Secondary | ICD-10-CM | POA: Diagnosis not present

## 2018-10-19 DIAGNOSIS — Z7982 Long term (current) use of aspirin: Secondary | ICD-10-CM | POA: Diagnosis not present

## 2018-10-19 DIAGNOSIS — Z87891 Personal history of nicotine dependence: Secondary | ICD-10-CM | POA: Diagnosis not present

## 2018-10-19 DIAGNOSIS — W06XXXA Fall from bed, initial encounter: Secondary | ICD-10-CM | POA: Diagnosis not present

## 2018-10-19 DIAGNOSIS — S0181XA Laceration without foreign body of other part of head, initial encounter: Secondary | ICD-10-CM | POA: Diagnosis not present

## 2018-10-20 DIAGNOSIS — M1711 Unilateral primary osteoarthritis, right knee: Secondary | ICD-10-CM | POA: Diagnosis not present

## 2018-10-20 DIAGNOSIS — Z96651 Presence of right artificial knee joint: Secondary | ICD-10-CM | POA: Diagnosis not present

## 2018-10-21 ENCOUNTER — Encounter (HOSPITAL_COMMUNITY): Payer: Self-pay

## 2018-10-26 ENCOUNTER — Encounter (HOSPITAL_COMMUNITY): Payer: Self-pay

## 2018-10-28 ENCOUNTER — Encounter (HOSPITAL_COMMUNITY): Payer: Self-pay

## 2018-11-02 ENCOUNTER — Encounter (HOSPITAL_COMMUNITY): Payer: Self-pay

## 2018-11-04 ENCOUNTER — Encounter (HOSPITAL_COMMUNITY): Payer: Self-pay

## 2018-11-09 ENCOUNTER — Encounter (HOSPITAL_COMMUNITY): Payer: Self-pay

## 2018-11-11 ENCOUNTER — Encounter (HOSPITAL_COMMUNITY): Payer: Self-pay

## 2018-12-05 DIAGNOSIS — M1712 Unilateral primary osteoarthritis, left knee: Secondary | ICD-10-CM | POA: Diagnosis not present

## 2018-12-05 DIAGNOSIS — M25562 Pain in left knee: Secondary | ICD-10-CM | POA: Diagnosis not present

## 2018-12-05 DIAGNOSIS — Z96651 Presence of right artificial knee joint: Secondary | ICD-10-CM | POA: Diagnosis not present

## 2018-12-07 DIAGNOSIS — N390 Urinary tract infection, site not specified: Secondary | ICD-10-CM | POA: Diagnosis not present

## 2018-12-14 ENCOUNTER — Other Ambulatory Visit: Payer: Self-pay | Admitting: Cardiovascular Disease

## 2018-12-27 DIAGNOSIS — N390 Urinary tract infection, site not specified: Secondary | ICD-10-CM | POA: Diagnosis not present

## 2019-01-13 DIAGNOSIS — N760 Acute vaginitis: Secondary | ICD-10-CM | POA: Diagnosis not present

## 2019-01-13 DIAGNOSIS — N3941 Urge incontinence: Secondary | ICD-10-CM | POA: Diagnosis not present

## 2019-01-13 DIAGNOSIS — R350 Frequency of micturition: Secondary | ICD-10-CM | POA: Diagnosis not present

## 2019-01-13 DIAGNOSIS — R351 Nocturia: Secondary | ICD-10-CM | POA: Diagnosis not present

## 2019-01-16 ENCOUNTER — Other Ambulatory Visit: Payer: Self-pay | Admitting: Oncology

## 2019-01-16 ENCOUNTER — Other Ambulatory Visit: Payer: Self-pay | Admitting: Cardiovascular Disease

## 2019-02-03 ENCOUNTER — Encounter: Payer: Self-pay | Admitting: Obstetrics & Gynecology

## 2019-02-03 ENCOUNTER — Ambulatory Visit (INDEPENDENT_AMBULATORY_CARE_PROVIDER_SITE_OTHER): Payer: Medicare Other | Admitting: Obstetrics & Gynecology

## 2019-02-03 ENCOUNTER — Other Ambulatory Visit: Payer: Self-pay

## 2019-02-03 VITALS — BP 132/70 | HR 88 | Temp 97.8°F | Ht 61.75 in | Wt 150.0 lb

## 2019-02-03 DIAGNOSIS — C50411 Malignant neoplasm of upper-outer quadrant of right female breast: Secondary | ICD-10-CM | POA: Diagnosis not present

## 2019-02-03 DIAGNOSIS — I10 Essential (primary) hypertension: Secondary | ICD-10-CM | POA: Diagnosis not present

## 2019-02-03 DIAGNOSIS — E538 Deficiency of other specified B group vitamins: Secondary | ICD-10-CM | POA: Diagnosis not present

## 2019-02-03 DIAGNOSIS — N9089 Other specified noninflammatory disorders of vulva and perineum: Secondary | ICD-10-CM | POA: Diagnosis not present

## 2019-02-03 DIAGNOSIS — M109 Gout, unspecified: Secondary | ICD-10-CM | POA: Diagnosis not present

## 2019-02-03 DIAGNOSIS — N898 Other specified noninflammatory disorders of vagina: Secondary | ICD-10-CM | POA: Diagnosis not present

## 2019-02-03 DIAGNOSIS — E785 Hyperlipidemia, unspecified: Secondary | ICD-10-CM | POA: Diagnosis not present

## 2019-02-03 MED ORDER — NONFORMULARY OR COMPOUNDED ITEM: 1 refills | Status: DC

## 2019-02-03 NOTE — Progress Notes (Signed)
73 y.o. G2P2 Married White or Caucasian female here for new patient exam for brown vaginal discharge x 1 month.  Referred from Dr. Matilde Sprang.  H/o hysterectomy at age 48.  She is not having any itching.  Denies odor as well.  She is sexually active.  She does have some low pelvic and lower back pain.    H/o breast cancer in 2014.  Did have genetic testing and it was negative.  Had initial tissue expanders which were rejected.  Then had tram flap and nipple tatoos.    Patient's last menstrual period was 08/17/1977 (approximate).          Sexually active: Yes.    The current method of family planning is status post hysterectomy.    Exercising: Yes.    golf Smoker:  no  Health Maintenance: Pap: ~2014 Normal  History of abnormal Pap:  Yes, remote hx  MMG:  2014 Bilateral mastectomy and reconstruction  Colonoscopy:  ~3-4 years ago. Needs f/u this year  BMD:   02/03/16 Normal  TDaP:  10/2018 Pneumonia vaccine(s): done Shingrix:   Done  Hep C testing: unsure  Screening Labs: PCP   reports that she has quit smoking. She started smoking about 26 years ago. She has a 20.00 pack-year smoking history. She has never used smokeless tobacco. She reports current alcohol use of about 7.0 - 14.0 standard drinks of alcohol per week. She reports that she does not use drugs.  Past Medical History:  Diagnosis Date  . Allergy   . Anxiety    takes Ativan daily prn  . Arthritis   . Breast cancer ([facility]) 2014   ER+/PR+/HEr2-,   . Bruises easily   . Colon polyps    2 polyps by report  . Diverticulosis   . GERD (gastroesophageal reflux disease)    occasionally takes Nexium   . Gout    takes Allopurinol daily and Colchicine daily prn;last attack 26yr ago  . Gout   . History of bladder infections    many yrs ago  . History of bronchitis    last time at least 867yrago  . History of stress incontinence   . Hyperlipidemia    takes Pravastatin daily  . Insomnia    takes Ambien nightly prn  . NSTEMI  (non-ST elevated myocardial infarction) ([facility]/[place]  . OSA (obstructive sleep apnea)    on CPAP  . Osteoarthritis   . Peripheral edema    takes Furosemide daily prn  . PONV (postoperative nausea and vomiting)   . Postmenopausal hormone therapy   . Radiation 07/27/13-09/07/13   Right Breast x 31 treatments  . Rhinitis    uses Flonase prn  . Sinus congestion   . Status post breast reconstruction 10/15/14   [place]  . Tachycardia    takes Metoprolol daily  . Ventricular tachycardia, non-sustained ([facility]   during sleep study 2009 with normal cardiac workup    Past Surgical History:  Procedure Laterality Date  . ABDOMINAL HYSTERECTOMY    . APPENDECTOMY    . AXILLARY SENTINEL NODE BIOPSY Right 05/23/2013   Procedure: AXILLARY SENTINEL NODE BIOPSY;  Surgeon: ToOdis HollingsheadMD;  [facility] Service: General;  Laterality: Right;  nuc med injection 7:00  . bilateral cataract surgery    . BREAST RECONSTRUCTION Bilateral 05/17/2014   W  . BREAST RECONSTRUCTION WITH PLACEMENT OF TISSUE EXPANDER AND FLEX HD (ACELLULAR HYDRATED DERMIS) Bilateral 05/23/2013  Procedure: BILATERAL BREAST RECONSTRUCTION WITH PLACEMENT OF TISSUE EXPANDER AND FLEX HD;  Surgeon: [name], MD;  Location: [facility];  Service: Plastics;  Laterality: Bilateral;  . CARDIAC CATHETERIZATION  1999  . CHOLECYSTECTOMY    . COLONOSCOPY    . COSMETIC SURGERY    . CYSTECTOMY  09/2017   had cyst removal from spine   . GALLBLADDER SURGERY    . PERCUTANEOUS CORONARY STENT INTERVENTION (PCI-S)  08/2017   done in [place]    . REMOVAL OF BILATERAL TISSUE EXPANDERS WITH PLACEMENT OF BILATERAL BREAST IMPLANTS Bilateral 05/17/2014   Procedure: REMOVAL OF BILATERAL TISSUE EXPANDERS WITH PLACEMENT OF BILATERAL BREAST IMPLANTS FOR RECONSTRUCTION;  Surgeon: [name], MD;  Location: [facility];  Service: Plastics;  Laterality: Bilateral;  . TONSILLECTOMY    . TOTAL KNEE  ARTHROPLASTY Right 07/26/2018   Procedure: TOTAL KNEE ARTHROPLASTY;  Surgeon: Paralee Cancel, MD;  Location: [facility];  Service: Orthopedics;  Laterality: Right;  70 mins  . TOTAL MASTECTOMY Bilateral 05/23/2013   WITH RECONSTRUCTION  . TOTAL MASTECTOMY Bilateral 05/23/2013   Procedure: TOTAL MASTECTOMY;  Surgeon: [name], MD;  Location: [facility];  Service: General;  Laterality: Bilateral;  . urinary tightening     d/t urinary incontinence    Current Outpatient Medications  Medication Sig Dispense Refill  . allopurinol (ZYLOPRIM) 100 MG tablet TAKE 1 TABLET BY MOUTH ONCE DAILY (Patient taking differently: Take 100 mg by mouth daily. ) 90 tablet 0  . apraclonidine (IOPIDINE) 0.5 % ophthalmic solution Place 1 drop into the right eye daily as needed (for droopy eye).   2  . ASPIRIN LOW DOSE 81 MG chewable tablet Chew 81 mg by mouth daily.   0  . celecoxib (CELEBREX) 200 MG capsule celecoxib 200 mg capsule  TAKE 1 CAPSULE BY MOUTH EVERY DAY    . Cholecalciferol (VITAMIN D3) 50 MCG (2000 UT) TABS Take 2,000 Units by mouth daily.    . clopidogrel (PLAVIX) 75 MG tablet Take 75 mg by mouth daily.   0  . colchicine 0.6 MG tablet TAKE 1 TABLET BY MOUTH EVERY DAY OR AS NEEDED (Patient taking differently: Take 0.6 mg by mouth daily as needed (for gout flare up). ) 30 tablet 0  . cyanocobalamin (,VITAMIN B-12,) 1000 MCG/ML injection Inject 1,000 mcg into the muscle every 30 (thirty) days.    Marland Kitchen docusate sodium (COLACE) 100 MG capsule Take 1 capsule (100 mg total) by mouth 2 (two) times daily. 10 capsule 0  . ezetimibe (ZETIA) 10 MG tablet Take 1 tablet (10 mg total) by mouth daily. 90 tablet 3  . fluticasone (FLONASE) 50 MCG/ACT nasal spray Place 1 spray into both nostrils daily.     . furosemide (LASIX) 40 MG tablet Take 40 mg by mouth daily as needed for fluid.     Marland Kitchen lisinopril (PRINIVIL,ZESTRIL) 10 MG tablet Take 10 mg by mouth daily.   11  . metoprolol succinate (TOPROL XL) 25 MG 24 hr tablet Take  1 tablet (25 mg total) by mouth daily. 90 tablet 3  . mometasone (ELOCON) 0.1 % cream APPLY A SMALL AMOUNT TO AFFECTED AREA ABOVE RIGHT EYE BID TO TID UNTIL RASH GONE    . Multiple Vitamin (MULTI-VITAMIN) tablet Take by mouth.    . pantoprazole (PROTONIX) 40 MG tablet TAKE 1 TABLET(40 MG) BY MOUTH DAILY 90 tablet 1  . PROAIR HFA 108 (90 Base) MCG/ACT inhaler INHALE 1 TO 2 PUFFS BY MOUTH EVERY 6 HOURS AS NEEDED FOR  30 DAYS.    Marland Kitchen rosuvastatin (CRESTOR) 20 MG tablet TAKE 1 TABLET(20 MG) BY MOUTH DAILY 30 tablet 2  . testosterone cypionate (DEPOTESTOSTERONE CYPIONATE) 200 MG/ML injection INJECT 1 ML INTO THE MUSCLE EVERY 28 DAYS. PATIENT IS DUE FOR APPOINTMENT    . venlafaxine XR (EFFEXOR-XR) 150 MG 24 hr capsule TAKE 1 CAPSULE BY MOUTH DAILY WITH BREAKFAST 90 capsule 0   No current facility-administered medications for this visit.     Family History  Problem Relation Age of Onset  . Breast cancer Mother        possible inflammatory breast cancer  . Heart attack Father   . Heart disease Father   . ALS Sister   . Breast cancer Maternal Grandmother 13  . Heart attack Maternal Grandfather   . Heart attack Paternal Grandfather   . Breast cancer Maternal Aunt        dx in her 39s  . Breast cancer Other        maternal great grandmother; dx in her 29s    Review of Systems  All other systems reviewed and are negative.   Exam:   BP 132/70   Pulse 88   Temp 97.8 F (36.6 C) (Temporal)   Ht 5' 1.75" (1.568 m)   Wt 150 lb (68 kg)   LMP 08/17/1977 (Approximate)   BMI 27.66 kg/m   Height: 5' 1.75" (156.8 cm)  Ht Readings from Last 3 Encounters:  02/03/19 5' 1.75" (1.568 m)  07/26/18 5' 2"  (1.575 m)  07/21/18 5' 2"  (1.575 m)    General appearance: alert, cooperative and appears stated age Head: Normocephalic, without obvious abnormality, atraumatic Neck: no adenopathy, supple, symmetrical, trachea midline and thyroid normal to inspection and palpation Breasts: bilateral  reconstruction with well healed scars and no LAD Heart: regular rate and rhythm Extremities: extremities normal, atraumatic, no cyanosis or edema Skin: Skin color, texture, turgor normal. No rashes or lesions Lymph nodes: Cervical, supraclavicular, and axillary nodes normal. No abnormal inguinal nodes palpated Neurologic: Grossly normal  Pelvic: External genitalia:  no lesions, enlarged clitoris noted              Urethra:  normal appearing urethra with no masses, tenderness or lesions              Bartholins and Skenes: normal                 Vagina: atrophic appearance, yellowish discharge present, no nodularity or masses              Cervix: absent              Pap taken: No. Bimanual Exam:  Uterus:  uterus absent              Adnexa: no mass, fullness, tenderness  Chaperone was present for exam.  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  Provider in [place] writes medication for her.  Recommended she stop.  States she won't.  ~30 minutes spent with patient >50% of time was in face to face discussion of above.

## 2019-02-03 NOTE — Patient Instructions (Signed)
[facility]   Address: [id number], [place], [place] [address]   Phone: [phone]

## 2019-02-09 DIAGNOSIS — R82998 Other abnormal findings in urine: Secondary | ICD-10-CM | POA: Diagnosis not present

## 2019-02-09 LAB — NUSWAB BV AND CANDIDA, NAA

## 2019-02-10 DIAGNOSIS — Z0000 Encounter for general adult medical examination without abnormal findings: Secondary | ICD-10-CM | POA: Diagnosis not present

## 2019-02-10 DIAGNOSIS — G4733 Obstructive sleep apnea (adult) (pediatric): Secondary | ICD-10-CM | POA: Diagnosis not present

## 2019-02-10 DIAGNOSIS — K219 Gastro-esophageal reflux disease without esophagitis: Secondary | ICD-10-CM | POA: Diagnosis not present

## 2019-02-10 DIAGNOSIS — F418 Other specified anxiety disorders: Secondary | ICD-10-CM | POA: Diagnosis not present

## 2019-02-10 DIAGNOSIS — C50911 Malignant neoplasm of unspecified site of right female breast: Secondary | ICD-10-CM | POA: Diagnosis not present

## 2019-02-10 DIAGNOSIS — N390 Urinary tract infection, site not specified: Secondary | ICD-10-CM | POA: Diagnosis not present

## 2019-02-10 DIAGNOSIS — E538 Deficiency of other specified B group vitamins: Secondary | ICD-10-CM | POA: Diagnosis not present

## 2019-02-10 DIAGNOSIS — I10 Essential (primary) hypertension: Secondary | ICD-10-CM | POA: Diagnosis not present

## 2019-02-10 DIAGNOSIS — E785 Hyperlipidemia, unspecified: Secondary | ICD-10-CM | POA: Diagnosis not present

## 2019-02-10 DIAGNOSIS — Z1331 Encounter for screening for depression: Secondary | ICD-10-CM | POA: Diagnosis not present

## 2019-02-10 DIAGNOSIS — I2510 Atherosclerotic heart disease of native coronary artery without angina pectoris: Secondary | ICD-10-CM | POA: Diagnosis not present

## 2019-02-10 DIAGNOSIS — M109 Gout, unspecified: Secondary | ICD-10-CM | POA: Diagnosis not present

## 2019-02-10 DIAGNOSIS — M179 Osteoarthritis of knee, unspecified: Secondary | ICD-10-CM | POA: Diagnosis not present

## 2019-03-06 ENCOUNTER — Telehealth: Payer: Self-pay | Admitting: Gastroenterology

## 2019-03-06 NOTE — Telephone Encounter (Signed)
ROI faxed to Dr. Benson Norway 7/20/20fbg

## 2019-03-10 ENCOUNTER — Telehealth: Payer: Self-pay | Admitting: Gastroenterology

## 2019-03-10 NOTE — Telephone Encounter (Signed)
Dr. Ardis Hughs, pt had EGD/col in 2015 with Dr. Benson Norway at [facility].  Records and associated path reports will be sent to you for review.  Will you accept this pt back?

## 2019-03-13 NOTE — Telephone Encounter (Signed)
Records will be shredded.  °

## 2019-03-13 NOTE — Telephone Encounter (Signed)
Pt reviewed records and declined to see pt. Per his notes: "Pt should stay with Dr. Benson Norway, pt declined several appts with me in the past." I spoke with pt and gave her Dr. Ardis Hughs' message.

## 2019-03-15 DIAGNOSIS — R635 Abnormal weight gain: Secondary | ICD-10-CM | POA: Diagnosis not present

## 2019-03-15 DIAGNOSIS — M549 Dorsalgia, unspecified: Secondary | ICD-10-CM | POA: Diagnosis not present

## 2019-03-15 DIAGNOSIS — R140 Abdominal distension (gaseous): Secondary | ICD-10-CM | POA: Diagnosis not present

## 2019-03-20 NOTE — Telephone Encounter (Signed)
Records rec'd bypass HIM Dept see note on file 7/24/20fbg

## 2019-03-27 ENCOUNTER — Other Ambulatory Visit: Payer: Self-pay

## 2019-03-27 DIAGNOSIS — H524 Presbyopia: Secondary | ICD-10-CM | POA: Diagnosis not present

## 2019-03-27 DIAGNOSIS — C50411 Malignant neoplasm of upper-outer quadrant of right female breast: Secondary | ICD-10-CM

## 2019-03-27 DIAGNOSIS — Z961 Presence of intraocular lens: Secondary | ICD-10-CM | POA: Diagnosis not present

## 2019-03-27 NOTE — Progress Notes (Signed)
ID: PAM VANALSTINE OB: [birth year]  MR#: [id number]  FUX#:[id number]  Patient Care Team: [name], MD as PCP - General (Internal Medicine) Sherren Mocha, MD as PCP - Cardiology (Cardiology) Magrinat, Virgie Dad, MD as Consulting Physician (Oncology) Megan Salon, MD as Consulting Physician (Gynecology) OTHER MD:   CHIEF COMPLAINT: right breast cancer (s/p bilateral mastectomies)  CURRENT TREATMENT: observation   INTERVAL HISTORY: Tracey [name] returns today for follow-up and treatment of her estrogen receptor positive breast cancer. She completed exemestane in 05/30/2018.  Since her last visit, her step-son passed away in 05-30-18. While visiting [place] for the funeral, she presented to the local ED with confusion, lightheadedness, and generalized weakness. She underwent chest x-ray and head CT, both of which were negative. She was diagnosed with vasovagal near-syncope and found to be dehydrated.  She also underwent right total knee arthroplasty on 07/26/2018 under Dr. Alvan Dame.  She presented to the ED on 08/16/2018 with acute chest pain, dizziness, and nausea. Chest x-ray performed that day was negative. This was followed by angio chest CT the next day, which showed: no evidence of pulmonary embolus; minimal peripheral opacities at the lung bases; diffuse wall thickening along the mid to distal esophagus; scattered coronary artery calcifications noted.   REVIEW OF SYSTEMS: Tobin reports "I don't have the energy I used to have." She states she used to be able to play 18 holes of golf, but now she cannot. She attributes this to her heart attack in January 2019. A detailed review of systems was otherwise stable.    HISTORY OF PRESENT ILLNESS: From the original intakenote:  [name] had routine screening mammography July of 2014 showing a suspicious mass in her right breast. Additional views confirmed a mass in the upper outer quadrant of the right breast, which by ultrasound measured 4 mm. Biopsy  of this mass was obtained in [place], at [facility]. 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  The patient's subsequent history is as detailed below   PAST MEDICAL HISTORY: Past Medical History:  Diagnosis Date  . Allergy   . Anxiety    takes Ativan daily prn  . Arthritis   . Breast cancer ([place]) 2014   ER+/PR+/HEr2-,   . Bruises easily   . Colon polyps    2 polyps by report  . Diverticulosis   . GERD (gastroesophageal reflux disease)    occasionally takes Nexium   . Gout    takes Allopurinol daily and Colchicine daily prn;last attack 80yr ago  . Gout   . History of bladder infections    many yrs ago  . History of bronchitis    last time at least 881yrago  . History of stress incontinence   . Hyperlipidemia    takes Pravastatin daily  . Insomnia    takes Ambien nightly prn  . NSTEMI (non-ST elevated myocardial infarction) ([place]/[place]  . OSA (obstructive sleep apnea)    on CPAP  . Osteoarthritis   . Peripheral edema    takes Furosemide daily prn  . PONV (postoperative nausea and vomiting)   . Postmenopausal hormone therapy   . Radiation 07/27/13-09/07/13   Right Breast x 31 treatments  . Rhinitis    uses Flonase prn  . Sinus congestion   . Status post breast reconstruction 10/15/14   Bilateral [place] and [place]  . Tachycardia  takes Metoprolol daily  . Ventricular tachycardia, non-sustained ([place])    during sleep study 2009 with normal cardiac workup    PAST SURGICAL HISTORY: Past Surgical History:  Procedure Laterality Date  . ABDOMINAL HYSTERECTOMY     with BSO  . APPENDECTOMY    . AXILLARY SENTINEL NODE BIOPSY Right 05/23/2013   Procedure: AXILLARY SENTINEL NODE BIOPSY;  Surgeon: [name], MD;  Location: [facility];  Service: General;  Laterality: Right;  nuc med injection  7:00  . BREAST RECONSTRUCTION WITH PLACEMENT OF TISSUE EXPANDER AND FLEX HD (ACELLULAR HYDRATED DERMIS) Bilateral 05/23/2013   Procedure: BILATERAL BREAST RECONSTRUCTION WITH PLACEMENT OF TISSUE EXPANDER AND FLEX HD;  Surgeon: [name], MD;  Location: [facility];  Service: Plastics;  Laterality: Bilateral;  . CARDIAC CATHETERIZATION  1999  . CATARACT EXTRACTION, BILATERAL Bilateral   . CHOLECYSTECTOMY    . COSMETIC SURGERY    . OTHER SURGICAL HISTORY  09/2017   had cyst removal from spine   . PERCUTANEOUS CORONARY STENT INTERVENTION (PCI-S)  08/2017   done in [place]    . REMOVAL OF BILATERAL TISSUE EXPANDERS WITH PLACEMENT OF BILATERAL BREAST IMPLANTS Bilateral 05/17/2014   Procedure: REMOVAL OF BILATERAL TISSUE EXPANDERS WITH PLACEMENT OF BILATERAL BREAST IMPLANTS FOR RECONSTRUCTION;  Surgeon: [name], MD;  Location: [facility];  Service: Plastics;  Laterality: Bilateral;  . TONSILLECTOMY    . TOTAL KNEE ARTHROPLASTY Right 07/26/2018   Procedure: TOTAL KNEE ARTHROPLASTY;  Surgeon: Paralee Cancel, MD;  Location: [facility];  Service: Orthopedics;  Laterality: Right;  70 mins  . TOTAL MASTECTOMY Bilateral 05/23/2013   Procedure: TOTAL MASTECTOMY;  Surgeon: [name], MD;  Location: [facility];  Service: General;  Laterality: Bilateral;    FAMILY HISTORY Family History  Problem Relation Age of Onset  . Breast cancer Mother        possible inflammatory breast cancer  . Heart attack Father   . Heart disease Father   . ALS Sister   . Breast cancer Maternal Grandmother 8  . Heart attack Maternal Grandfather   . Heart attack Paternal Grandfather   . Breast cancer Maternal Aunt        dx in her 68s  . Breast cancer Other        maternal great grandmother; dx in her 37s   the patient's father died from heart disease at the age of 57. The patient's mother died from breast cancer at the age of 29. It is not clear when she was diagnosed since she "dictated and". The patient had no brothers. One  sister died from [place] at the age of 89 in addition, one of the patient's mother is sisters was diagnosed with breast cancer in her 14s. The patient's mother is mother, was also diagnosed with breast cancer, at the age of 64. The patient's daughter, [name], has been tested for the BRCA genes and was negative.   GYNECOLOGIC HISTORY:  Menarche age 1, first live birth age 54, the patient is [place] P2. She underwent total abdominal hysterectomy and bilateral salpingo-oophorectomy at the age of 5 for endometriosis. She took hormone replacement until August of 2014.   SOCIAL HISTORY:  Tracey [name] moved to [place] About 4 years ago and is planning to make this area her "home-base", although they also have a home in [place] where they go for the winter to ski. 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 They are  now enjoying their retirement. Daughter [name] lives in [place]. A younger daughter [name] is a homemaker in [place]. Husband [name] also had a son, but unfortunately the son passed away in 10/04/2018. The patient has 3 grandchildren. She is a Furniture conservator/restorer.    ADVANCED DIRECTIVES: In place   HEALTH MAINTENANCE: Social History   Tobacco Use  . Smoking status: Former Smoker    Packs/day: 1.00    Years: 20.00    Pack years: 20.00    Start date: 06/09/1992  . Smokeless tobacco: Never Used  . Tobacco comment: quit at age 37  Substance Use Topics  . Alcohol use: Yes    Alcohol/week: 7.0 - 14.0 standard drinks    Types: 7 - 14 Glasses of wine per week  . Drug use: No    Comment: quit 24 years ago     Colonoscopy: October 04, 2008  PAP: August 2014  Bone density: 2008/10/04, normal per patient  Lipid panel:  Allergies  Allergen Reactions  . Pseudoeph-Hydrocodone-Gg Other (See Comments)  . Ivp Dye [Iodinated Diagnostic Agents] Hives  . Sulfa Antibiotics Swelling  . Tape Other (See Comments)    Unknown; has paper thin  skin ; ok to use paper tape     Current Outpatient Medications  Medication Sig Dispense Refill  . allopurinol (ZYLOPRIM) 100 MG tablet TAKE 1 TABLET BY MOUTH ONCE DAILY (Patient taking differently: Take 100 mg by mouth daily. ) 90 tablet 0  . apraclonidine (IOPIDINE) 0.5 % ophthalmic solution Place 1 drop into the right eye daily as needed (for droopy eye).   2  . ASPIRIN LOW DOSE 81 MG chewable tablet Chew 81 mg by mouth daily.   0  . celecoxib (CELEBREX) 200 MG capsule celecoxib 200 mg capsule  TAKE 1 CAPSULE BY MOUTH EVERY DAY    . Cholecalciferol (VITAMIN D3) 50 MCG (2000 UT) TABS Take 2,000 Units by mouth daily.    . clopidogrel (PLAVIX) 75 MG tablet Take 75 mg by mouth daily.   0  . colchicine 0.6 MG tablet TAKE 1 TABLET BY MOUTH EVERY DAY OR AS NEEDED (Patient taking differently: Take 0.6 mg by mouth daily as needed (for gout flare up). ) 30 tablet 0  . cyanocobalamin (,VITAMIN B-12,) 1000 MCG/ML injection Inject 1,000 mcg into the muscle every 30 (thirty) days.    Marland Kitchen docusate sodium (COLACE) 100 MG capsule Take 1 capsule (100 mg total) by mouth 2 (two) times daily. 10 capsule 0  . ezetimibe (ZETIA) 10 MG tablet Take 1 tablet (10 mg total) by mouth daily. 90 tablet 3  . fluticasone (FLONASE) 50 MCG/ACT nasal spray Place 1 spray into both nostrils daily.     . furosemide (LASIX) 40 MG tablet Take 40 mg by mouth daily as needed for fluid.     Marland Kitchen lisinopril (PRINIVIL,ZESTRIL) 10 MG tablet Take 10 mg by mouth daily.   11  . metoprolol succinate (TOPROL XL) 25 MG 24 hr tablet Take 1 tablet (25 mg total) by mouth daily. 90 tablet 3  . mometasone (ELOCON) 0.1 % cream APPLY A SMALL AMOUNT TO AFFECTED AREA ABOVE RIGHT EYE BID TO TID UNTIL RASH GONE    . Multiple Vitamin (MULTI-VITAMIN) tablet Take by mouth.    . NONFORMULARY OR COMPOUNDED ITEM Vitamin E vaginal suppositories 200u/ml.  One pv three times weekly. 36 each 1  . pantoprazole (PROTONIX) 40 MG tablet TAKE 1 TABLET(40 MG) BY MOUTH DAILY  90 tablet 1  . PROAIR HFA  108 (90 Base) MCG/ACT inhaler INHALE 1 TO 2 PUFFS BY MOUTH EVERY 6 HOURS AS NEEDED FOR 30 DAYS.    Marland Kitchen rosuvastatin (CRESTOR) 20 MG tablet TAKE 1 TABLET(20 MG) BY MOUTH DAILY 30 tablet 2  . testosterone cypionate (DEPOTESTOSTERONE CYPIONATE) 200 MG/ML injection INJECT 1 ML INTO THE MUSCLE EVERY 28 DAYS. PATIENT IS DUE FOR APPOINTMENT    . venlafaxine XR (EFFEXOR-XR) 150 MG 24 hr capsule TAKE 1 CAPSULE BY MOUTH DAILY WITH BREAKFAST 90 capsule 0   No current facility-administered medications for this visit.     OBJECTIVE: Middle-aged white woman who appears stated age  73:   03/28/19 1033  BP: (!) 144/59  Pulse: 71  Resp: 20  Temp: 97.8 F (36.6 C)  SpO2: 100%     Body mass index is 30.1 kg/m.    ECOG FS:1 - Symptomatic but completely ambulatory Filed Weights   03/28/19 1033  Weight: 159 lb 4.8 oz (72.3 kg)    Sclerae unicteric, EOMs intact Wearing a mask No cervical or supraclavicular adenopathy Lungs no rales or rhonchi Heart regular rate and rhythm Abd soft, nontender, positive bowel sounds MSK no focal spinal tenderness, no upper extremity lymphedema Neuro: nonfocal, well oriented, appropriate affect Breasts: Status post bilateral mastectomies with D IEP reconstruction as well as status post radiation on the right side.  She has telangiectasias on the right side secondary to the radiation.  Both axillae are benign.  LAB RESULTS:   Lab Results  Component Value Date   WBC 5.3 03/28/2019   NEUTROABS 3.3 03/28/2019   HGB 13.5 03/28/2019   HCT 42.3 03/28/2019   MCV 94.4 03/28/2019   PLT 180 03/28/2019      Chemistry      Component Value Date/Time   NA 140 08/17/2018 0339   NA 143 01/20/2018 0952   NA 141 01/06/2017 1316   K 4.3 08/17/2018 0339   K 4.4 01/06/2017 1316   CL 107 08/17/2018 0339   CO2 19 (L) 08/17/2018 0339   CO2 23 01/06/2017 1316   BUN 16 08/17/2018 0339   BUN 18 01/20/2018 0952   BUN 23.3 01/06/2017 1316    CREATININE 1.09 (H) 08/17/2018 0339   CREATININE 1.29 (H) 12/06/2017 1016   CREATININE 0.9 01/06/2017 1316      Component Value Date/Time   CALCIUM 9.6 08/17/2018 0339   CALCIUM 10.0 01/06/2017 1316   ALKPHOS 79 04/29/2018 1129   ALKPHOS 85 01/06/2017 1316   AST 34 04/29/2018 1129   AST 29 12/06/2017 1016   AST 27 01/06/2017 1316   ALT 39 (H) 04/29/2018 1129   ALT 45 12/06/2017 1016   ALT 40 01/06/2017 1316   BILITOT 0.4 04/29/2018 1129   BILITOT 0.4 12/06/2017 1016   BILITOT 0.47 01/06/2017 1316       STUDIES: No results found.   ASSESSMENT: 73 y.o. BRCA 1 & 2 negative [place] woman   (1)  status post right breast upper outer quadrant biopsy 03/24/2013 for a pT1b cN0, stage IA invasive ductal carcinoma, grade 1, strongly estrogen and progesterone receptor positive, HER-2 negative  (2) status post bilateral mastectomies 05/23/2013 showing:  (a) on the right, a pT1b pN0, stage invasive ductal carcinoma, grade 1, again HER-2 negative  (b) on the left, no evidence of malignancy  (3) Oncotype DX score of 20 predicted a rate of distant recurrence within 10 years of 13% if the patient's only systemic treatment was tamoxifen for 5 years.  (4) a positive margin required  adjuvant radiation, completed January 2015 in [place]  (5) status post bilateral implant placement January 2015  (a) with impending right implant rupture February 2016 underwent bilateral DIEP reconstruction under Dr. Kandyce Rud in [place] (fax [phone])  (6)  started on anastrozole, mid September 2014, prior to definitive surgery; continued postop; bone density 07/31/2013 was normal  (a) repeat bone density 02/03/2016 showed a T score of -0.1 (normal  (b) switched to tamoxifen November 2018 because of arthralgias secondary to the anastrozole  (c) switched to exemestane as of 12/13/2017  (d) completing five years of anti-estrogens September 2019  PLAN: Natisha is now just about 6 years out from  definitive surgery for her breast cancer with no evidence of disease recurrence.  This is very favorable.  We reviewed her breast exam in detail and she understands telangiectasias as she is developing on the right are very common after radiation.  The do not require any particular treatment or evaluation.  I think she would enjoy participating in our survivorship program so next year she will see my nurse practitioner.  She knows to call for any other issue that may develop before the next visit here.  Magrinat, Virgie Dad, MD  03/28/19 10:58 AM Medical Oncology and Hematology [facility] [address] [place], [place] [address] Tel. [phone]    Fax. [phone]   I, Wilburn Mylar, am acting as scribe for Dr. Virgie Dad. Magrinat.  I, Lurline Del MD, have reviewed the above documentation for accuracy and completeness, and I agree with the above.

## 2019-03-28 ENCOUNTER — Other Ambulatory Visit: Payer: Self-pay

## 2019-03-28 ENCOUNTER — Inpatient Hospital Stay (HOSPITAL_BASED_OUTPATIENT_CLINIC_OR_DEPARTMENT_OTHER): Payer: Medicare Other | Admitting: Oncology

## 2019-03-28 ENCOUNTER — Inpatient Hospital Stay: Payer: Medicare Other | Attending: Oncology

## 2019-03-28 VITALS — BP 144/59 | HR 71 | Temp 97.8°F | Resp 20 | Ht 61.0 in | Wt 159.3 lb

## 2019-03-28 DIAGNOSIS — R55 Syncope and collapse: Secondary | ICD-10-CM | POA: Diagnosis not present

## 2019-03-28 DIAGNOSIS — Z87891 Personal history of nicotine dependence: Secondary | ICD-10-CM | POA: Insufficient documentation

## 2019-03-28 DIAGNOSIS — G4733 Obstructive sleep apnea (adult) (pediatric): Secondary | ICD-10-CM | POA: Insufficient documentation

## 2019-03-28 DIAGNOSIS — M1990 Unspecified osteoarthritis, unspecified site: Secondary | ICD-10-CM | POA: Diagnosis not present

## 2019-03-28 DIAGNOSIS — Z96651 Presence of right artificial knee joint: Secondary | ICD-10-CM | POA: Insufficient documentation

## 2019-03-28 DIAGNOSIS — Z170 Estrogen receptor positive status [ER+]: Secondary | ICD-10-CM | POA: Diagnosis not present

## 2019-03-28 DIAGNOSIS — Z9013 Acquired absence of bilateral breasts and nipples: Secondary | ICD-10-CM | POA: Insufficient documentation

## 2019-03-28 DIAGNOSIS — Z79899 Other long term (current) drug therapy: Secondary | ICD-10-CM | POA: Diagnosis not present

## 2019-03-28 DIAGNOSIS — R918 Other nonspecific abnormal finding of lung field: Secondary | ICD-10-CM | POA: Insufficient documentation

## 2019-03-28 DIAGNOSIS — C50411 Malignant neoplasm of upper-outer quadrant of right female breast: Secondary | ICD-10-CM | POA: Diagnosis not present

## 2019-03-28 DIAGNOSIS — Z803 Family history of malignant neoplasm of breast: Secondary | ICD-10-CM | POA: Insufficient documentation

## 2019-03-28 DIAGNOSIS — Z888 Allergy status to other drugs, medicaments and biological substances status: Secondary | ICD-10-CM | POA: Insufficient documentation

## 2019-03-28 DIAGNOSIS — Z8249 Family history of ischemic heart disease and other diseases of the circulatory system: Secondary | ICD-10-CM | POA: Insufficient documentation

## 2019-03-28 DIAGNOSIS — Z923 Personal history of irradiation: Secondary | ICD-10-CM | POA: Insufficient documentation

## 2019-03-28 DIAGNOSIS — Z955 Presence of coronary angioplasty implant and graft: Secondary | ICD-10-CM | POA: Insufficient documentation

## 2019-03-28 DIAGNOSIS — M2550 Pain in unspecified joint: Secondary | ICD-10-CM | POA: Insufficient documentation

## 2019-03-28 DIAGNOSIS — Z882 Allergy status to sulfonamides status: Secondary | ICD-10-CM | POA: Diagnosis not present

## 2019-03-28 DIAGNOSIS — I252 Old myocardial infarction: Secondary | ICD-10-CM | POA: Insufficient documentation

## 2019-03-28 DIAGNOSIS — R600 Localized edema: Secondary | ICD-10-CM | POA: Insufficient documentation

## 2019-03-28 DIAGNOSIS — Z885 Allergy status to narcotic agent status: Secondary | ICD-10-CM | POA: Diagnosis not present

## 2019-03-28 DIAGNOSIS — Z7289 Other problems related to lifestyle: Secondary | ICD-10-CM | POA: Diagnosis not present

## 2019-03-28 DIAGNOSIS — G4700 Insomnia, unspecified: Secondary | ICD-10-CM | POA: Insufficient documentation

## 2019-03-28 DIAGNOSIS — Z8719 Personal history of other diseases of the digestive system: Secondary | ICD-10-CM | POA: Diagnosis not present

## 2019-03-28 LAB — CBC WITH DIFFERENTIAL (CANCER CENTER ONLY)
Abs Immature Granulocytes: 0.01 10*3/uL (ref 0.00–0.07)
Basophils Absolute: 0 10*3/uL (ref 0.0–0.1)
Basophils Relative: 1 %
Eosinophils Absolute: 0.2 10*3/uL (ref 0.0–0.5)
Eosinophils Relative: 3 %
HCT: 42.3 % (ref 36.0–46.0)
Hemoglobin: 13.5 g/dL (ref 12.0–15.0)
Immature Granulocytes: 0 %
Lymphocytes Relative: 26 %
Lymphs Abs: 1.4 10*3/uL (ref 0.7–4.0)
MCH: 30.1 pg (ref 26.0–34.0)
MCHC: 31.9 g/dL (ref 30.0–36.0)
MCV: 94.4 fL (ref 80.0–100.0)
Monocytes Absolute: 0.5 10*3/uL (ref 0.1–1.0)
Monocytes Relative: 9 %
Neutro Abs: 3.3 10*3/uL (ref 1.7–7.7)
Neutrophils Relative %: 61 %
Platelet Count: 180 10*3/uL (ref 150–400)
RBC: 4.48 MIL/uL (ref 3.87–5.11)
RDW: 14.7 % (ref 11.5–15.5)
WBC Count: 5.3 10*3/uL (ref 4.0–10.5)
nRBC: 0 % (ref 0.0–0.2)

## 2019-03-28 LAB — CMP (CANCER CENTER ONLY)
ALT: 25 U/L (ref 0–44)
AST: 30 U/L (ref 15–41)
Albumin: 4.3 g/dL (ref 3.5–5.0)
Alkaline Phosphatase: 74 U/L (ref 38–126)
Anion gap: 8 (ref 5–15)
BUN: 21 mg/dL (ref 8–23)
CO2: 24 mmol/L (ref 22–32)
Calcium: 9.2 mg/dL (ref 8.9–10.3)
Chloride: 105 mmol/L (ref 98–111)
Creatinine: 1.15 mg/dL — ABNORMAL HIGH (ref 0.44–1.00)
GFR, Est AFR Am: 54 mL/min — ABNORMAL LOW
GFR, Estimated: 47 mL/min — ABNORMAL LOW
Glucose, Bld: 113 mg/dL — ABNORMAL HIGH (ref 70–99)
Potassium: 5.1 mmol/L (ref 3.5–5.1)
Sodium: 137 mmol/L (ref 135–145)
Total Bilirubin: 0.7 mg/dL (ref 0.3–1.2)
Total Protein: 7.3 g/dL (ref 6.5–8.1)

## 2019-04-10 DIAGNOSIS — R140 Abdominal distension (gaseous): Secondary | ICD-10-CM | POA: Diagnosis not present

## 2019-04-10 DIAGNOSIS — M549 Dorsalgia, unspecified: Secondary | ICD-10-CM | POA: Diagnosis not present

## 2019-04-10 DIAGNOSIS — Z23 Encounter for immunization: Secondary | ICD-10-CM | POA: Diagnosis not present

## 2019-04-10 DIAGNOSIS — K580 Irritable bowel syndrome with diarrhea: Secondary | ICD-10-CM | POA: Diagnosis not present

## 2019-04-14 ENCOUNTER — Other Ambulatory Visit: Payer: Self-pay | Admitting: Oncology

## 2019-04-14 ENCOUNTER — Other Ambulatory Visit: Payer: Self-pay | Admitting: Cardiovascular Disease

## 2019-04-14 ENCOUNTER — Other Ambulatory Visit: Payer: Self-pay | Admitting: Physician Assistant

## 2019-04-14 MED ORDER — ROSUVASTATIN CALCIUM 20 MG PO TABS: 20.0000 mg | ORAL_TABLET | Freq: Every day | ORAL | 0 refills | Status: DC

## 2019-04-16 ENCOUNTER — Other Ambulatory Visit: Payer: Self-pay | Admitting: Cardiovascular Disease

## 2019-04-26 DIAGNOSIS — E538 Deficiency of other specified B group vitamins: Secondary | ICD-10-CM | POA: Diagnosis not present

## 2019-04-26 DIAGNOSIS — K219 Gastro-esophageal reflux disease without esophagitis: Secondary | ICD-10-CM | POA: Diagnosis not present

## 2019-04-26 DIAGNOSIS — R05 Cough: Secondary | ICD-10-CM | POA: Diagnosis not present

## 2019-04-26 DIAGNOSIS — R5383 Other fatigue: Secondary | ICD-10-CM | POA: Diagnosis not present

## 2019-04-26 DIAGNOSIS — Z20818 Contact with and (suspected) exposure to other bacterial communicable diseases: Secondary | ICD-10-CM | POA: Diagnosis not present

## 2019-04-26 DIAGNOSIS — I130 Hypertensive heart and chronic kidney disease with heart failure and stage 1 through stage 4 chronic kidney disease, or unspecified chronic kidney disease: Secondary | ICD-10-CM | POA: Insufficient documentation

## 2019-04-26 DIAGNOSIS — Z20828 Contact with and (suspected) exposure to other viral communicable diseases: Secondary | ICD-10-CM | POA: Diagnosis not present

## 2019-04-26 DIAGNOSIS — C50911 Malignant neoplasm of unspecified site of right female breast: Secondary | ICD-10-CM | POA: Diagnosis not present

## 2019-04-26 DIAGNOSIS — I119 Hypertensive heart disease without heart failure: Secondary | ICD-10-CM | POA: Diagnosis not present

## 2019-04-26 DIAGNOSIS — I2510 Atherosclerotic heart disease of native coronary artery without angina pectoris: Secondary | ICD-10-CM | POA: Diagnosis not present

## 2019-05-15 ENCOUNTER — Encounter: Payer: Self-pay | Admitting: Obstetrics & Gynecology

## 2019-05-15 ENCOUNTER — Ambulatory Visit (INDEPENDENT_AMBULATORY_CARE_PROVIDER_SITE_OTHER): Payer: Medicare Other | Admitting: Obstetrics & Gynecology

## 2019-05-15 ENCOUNTER — Other Ambulatory Visit: Payer: Self-pay

## 2019-05-15 VITALS — BP 136/76 | HR 92 | Temp 97.3°F | Ht 61.0 in | Wt 157.0 lb

## 2019-05-15 DIAGNOSIS — N952 Postmenopausal atrophic vaginitis: Secondary | ICD-10-CM

## 2019-05-15 NOTE — Progress Notes (Signed)
GYNECOLOGY  VISIT  CC:   Follow up after using Vit E vag suppositories.   HPI: 73 y.o. G75P2000 Married White or Caucasian female here for follow up after using Vit E vaginal suppositories.  Discharge has significantly decreased and this has been a positive change for her.  Does have some issues with placing vaginal Vit E suppository due to positioning and arthritis in hands.  Doesn't want to stop but interested in other options.  D/w pt could have this compounded as a cream for insertion vaginally with a syringe and basically the same dosage.  She is interested in this.  Will change rx to a cream to go to [facility].  Pt is using injectable testosterone and has been doing this for years.  Travels to [place] to do this and has lab work done there.  It past time for injection.  Brought medication bottle with her today and asks if I will refill this.  At last visit, she had clitoromegaly and I recommended a testosterone level.  Pt declined at that time.  Given this finding and h/o breast cancer in 2014, I feel uncomfortable with this medication and the dosage.  Pt willing to have testosterone tested today but as it is overdue for dosage, this will not be as useful as when there has been a recent dosage.  Due to the physical exam findings at last visit, I feel the dosage she is receiving and likely supra therapeutic.  Again, as it is overdue, this will likely look more normal today.  Pt really feels that the injectable testosterone is why she feels so good and looks so good.  Does not want to stop this.  Cries with visit when I declined to refill medication.  She feels this is her decision to make, not mine.  Reminded pt that, of course, it is her body and she decides what medications to use but that I must use my clinical judgment and no prescribe medications that I feel are incorrectly dosed or add significant risks.    There are likely integrative providers in the area who will feel differently  and may prescribe this.  Of course, I cannot be sure about this.  Discussed with pt possible options.  GYNECOLOGIC HISTORY: Patient's last menstrual period was 08/17/1977 (approximate). Contraception: PMP Menopausal hormone therapy:   Patient Active Problem List   Diagnosis Date Noted  . Metabolic acidosis with increased anion gap and accumulation of organic acids 08/17/2018  . Nausea, vomiting, and diarrhea 08/17/2018  . AKI (acute kidney injury) ([place]) 08/17/2018  . Overweight (BMI 25.0-29.9) 07/27/2018  . 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/27/2018  . S/P right TKA 07/26/2018  . S/P knee replacement 07/26/2018  . UARS (upper airway resistance syndrome) 05/30/2018  . Hyperlipidemia 05/02/2018  . Leg injury, right, initial encounter 01/06/2017  . Contusion of right lower leg 01/06/2017  . Hypoxemia 12/23/2015  . Statin myopathy 05/20/2015  . MCI (mild cognitive impairment) 05/20/2015  . Edema 05/17/2015  . 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 Breast cancer genetic susceptibility 04/02/2015  . OSA on CPAP 04/02/2015  . CAD (coronary artery disease), native coronary artery 01/01/2014  . Chest pain 01/01/2014  . Hypertension   . Ventricular tachycardia, non-sustained ([place])   . Breast cancer of upper-outer quadrant of right female breast ([place]) 06/28/2013  . Postoperative visit 06/02/2013  . Gout   . Tachycardia     Past Medical History:  Diagnosis Date  . Allergy   .  Anxiety    takes Ativan daily prn  . Arthritis   . Breast cancer ([place]) 2014   ER+/PR+/HEr2-,   . Bruises easily   . Colon polyps    2 polyps by report  . Diverticulosis   . GERD (gastroesophageal reflux disease)    occasionally takes Nexium   . Gout    takes Allopurinol daily and Colchicine daily prn;last attack 80yr ago  . Gout   . History of bladder infections    many yrs ago  . History of bronchitis    last time at least 88yrago  . History of stress incontinence   . Hyperlipidemia     takes Pravastatin daily  . Insomnia    takes Ambien nightly prn  . NSTEMI (non-ST elevated myocardial infarction) [place]  . OSA (obstructive sleep apnea)    on CPAP  . Osteoarthritis   . Peripheral edema    takes Furosemide daily prn  . PONV (postoperative nausea and vomiting)   . Postmenopausal hormone therapy   . Radiation 07/27/13-09/07/13   Right Breast x 31 treatments  . Rhinitis    uses Flonase prn  . Sinus congestion   . Status post breast reconstruction 10/15/14   [place]  . Tachycardia    takes Metoprolol daily  . Ventricular tachycardia, non-sustained ([facility]   during sleep study 2009 with normal cardiac workup    Past Surgical History:  Procedure Laterality Date  . ABDOMINAL HYSTERECTOMY     with BSO  . APPENDECTOMY    . AXILLARY SENTINEL NODE BIOPSY Right 05/23/2013   Procedure: AXILLARY SENTINEL NODE BIOPSY;  Surgeon: ToOdis HollingsheadMD;  [facility] Service: General;  Laterality: Right;  nuc med injection 7:00  . BREAST RECONSTRUCTION WITH PLACEMENT OF TISSUE EXPANDER AND FLEX HD (ACELLULAR HYDRATED DERMIS) Bilateral 05/23/2013   Procedure: BILATERAL BREAST RECONSTRUCTION WITH PLACEMENT OF TISSUE EXPANDER AND FLEX HD;  Surgeon: DaCrissie ReeseMD;  [facility] Service: Plastics;  Laterality: Bilateral;  . CARDIAC CATHETERIZATION  1999  . CATARACT EXTRACTION, BILATERAL Bilateral   . CHOLECYSTECTOMY    . COSMETIC SURGERY    . OTHER SURGICAL HISTORY  09/2017   had cyst removal from spine   . PERCUTANEOUS CORONARY STENT INTERVENTION (PCI-S)  [place]2017   [place]  . REMOVAL OF BILATERAL TISSUE EXPANDERS WITH PLACEMENT OF BILATERAL BREAST IMPLANTS Bilateral 05/17/2014   Procedure: REMOVAL OF BILATERAL TISSUE EXPANDERS WITH PLACEMENT OF BILATERAL BREAST IMPLANTS FOR RECONSTRUCTION;  Surgeon: DaCrissie ReeseMD;  [facility] Service: Plastics;  Laterality: Bilateral;  . TONSILLECTOMY    .  TOTAL KNEE ARTHROPLASTY Right 07/26/2018   Procedure: TOTAL KNEE ARTHROPLASTY;  Surgeon: OlParalee CancelMD;  [facility];  Service: Orthopedics;  Laterality: Right;  70 mins  . TOTAL MASTECTOMY Bilateral 05/23/2013   Procedure: TOTAL MASTECTOMY;  Surgeon: ToOdis HollingsheadMD;  [facility] Service: General;  Laterality: Bilateral;    MEDS:   Current Outpatient Medications on File Prior to Visit  Medication Sig Dispense Refill  . allopurinol (ZYLOPRIM) 100 MG tablet TAKE 1 TABLET BY MOUTH ONCE DAILY (Patient taking differently: Take 100 mg by mouth daily. ) 90 tablet 0  . apraclonidine (IOPIDINE) 0.5 % ophthalmic solution Place 1 drop into the right eye daily as needed (for droopy eye).   2  . ASPIRIN LOW DOSE 81 MG chewable tablet Chew 81 mg  by mouth daily.   0  . celecoxib (CELEBREX) 200 MG capsule celecoxib 200 mg capsule  TAKE 1 CAPSULE BY MOUTH EVERY DAY    . Cholecalciferol (VITAMIN D3) 50 MCG (2000 UT) TABS Take 2,000 Units by mouth daily.    . clopidogrel (PLAVIX) 75 MG tablet Take 75 mg by mouth daily.   0  . colchicine 0.6 MG tablet TAKE 1 TABLET BY MOUTH EVERY DAY OR AS NEEDED (Patient taking differently: Take 0.6 mg by mouth daily as needed (for gout flare up). ) 30 tablet 0  . cyanocobalamin (,VITAMIN B-12,) 1000 MCG/ML injection Inject 1,000 mcg into the muscle every 30 (thirty) days.    Marland Kitchen docusate sodium (COLACE) 100 MG capsule Take 1 capsule (100 mg total) by mouth 2 (two) times daily. 10 capsule 0  . ezetimibe (ZETIA) 10 MG tablet TAKE 1 TABLET(10 MG) BY MOUTH DAILY 90 tablet 0  . fluticasone (FLONASE) 50 MCG/ACT nasal spray Place 1 spray into both nostrils daily.     . furosemide (LASIX) 40 MG tablet Take 40 mg by mouth daily as needed for fluid.     Marland Kitchen lisinopril (PRINIVIL,ZESTRIL) 10 MG tablet Take 10 mg by mouth daily.   11  . metoprolol succinate (TOPROL XL) 25 MG 24 hr tablet Take 1 tablet (25 mg total) by mouth daily. 90 tablet 3  . Multiple Vitamin  (MULTI-VITAMIN) tablet Take by mouth.    . NONFORMULARY OR COMPOUNDED ITEM Vitamin E vaginal suppositories 200u/ml.  One pv three times weekly. 36 each 1  . pantoprazole (PROTONIX) 40 MG tablet TAKE 1 TABLET(40 MG) BY MOUTH DAILY 90 tablet 1  . PROAIR HFA 108 (90 Base) MCG/ACT inhaler INHALE 1 TO 2 PUFFS BY MOUTH EVERY 6 HOURS AS NEEDED FOR 30 DAYS.    Marland Kitchen rosuvastatin (CRESTOR) 20 MG tablet Take 1 tablet (20 mg total) by mouth daily. 90 tablet 0  . testosterone cypionate (DEPOTESTOSTERONE CYPIONATE) 200 MG/ML injection INJECT 1 ML INTO THE MUSCLE EVERY 28 DAYS. PATIENT IS DUE FOR APPOINTMENT    . venlafaxine XR (EFFEXOR-XR) 150 MG 24 hr capsule TAKE 1 CAPSULE BY MOUTH DAILY WITH BREAKFAST 90 capsule 1   No current facility-administered medications on file prior to visit.     ALLERGIES: Pseudoeph-hydrocodone-gg, Ivp dye [iodinated diagnostic agents], Sulfa antibiotics, and Tape  Family History  Problem Relation Age of Onset  . Breast cancer Mother        possible inflammatory breast cancer  . Heart attack Father   . Heart disease Father   . ALS Sister   . Breast cancer Maternal Grandmother 85  . Heart attack Maternal Grandfather   . Heart attack Paternal Grandfather   . Breast cancer Maternal Aunt        dx in her 31s  . Breast cancer Other        maternal great grandmother; dx in her 50s    SH:  Married, non smoker  Review of Systems  All other systems reviewed and are negative.   PHYSICAL EXAMINATION:    BP 136/76   Pulse 92   Temp (!) 97.3 F (36.3 C) (Temporal)   Ht _0  (1.549 m)   Wt 157 lb (71.2 kg)   LMP 08/17/1977 (Approximate)   BMI 29.66 kg/m     General appearance: alert, cooperative and appears stated age Lymph:  no inguinal LAD noted   Pelvic: External genitalia:  no lesions, clitoris is much smaller today than compared to prior visit  Urethra:  normal appearing urethra with no masses, tenderness or lesions              Bartholins and  Skenes: normal                 Vagina: normal appearing vagina with normal color and discharge, no lesions              Cervix: absent              Bimanual Exam:  Uterus:  uterus absent              Adnexa: no mass, fullness, tenderness  Chaperone was present for exam.  Assessment: Vaginal atrophic changes with hx of yellowish discharge that is much improved with Vit E vaginal suppositories H/o long term injectable testosterone use H/o breast cancer 2014 with bilateral mastectomy and subsequent reconstruction   Plan: Rx for vit E vaginal cream 200u/ml, one ml pv twice weekly.  #3 month supply/4RF Integrative provider options discussed with tp   ~15 minutes spent with patient >50% of time was in face to face discussion of above.

## 2019-05-17 MED ORDER — NONFORMULARY OR COMPOUNDED ITEM: 1 refills | Status: DC

## 2019-05-22 ENCOUNTER — Encounter: Payer: Self-pay | Admitting: Cardiovascular Disease

## 2019-05-22 ENCOUNTER — Other Ambulatory Visit: Payer: Self-pay

## 2019-05-22 ENCOUNTER — Other Ambulatory Visit: Payer: Medicare Other | Admitting: *Deleted

## 2019-05-22 ENCOUNTER — Ambulatory Visit (INDEPENDENT_AMBULATORY_CARE_PROVIDER_SITE_OTHER): Payer: Medicare Other | Admitting: Cardiovascular Disease

## 2019-05-22 VITALS — BP 110/68 | HR 68 | Ht 62.0 in | Wt 159.6 lb

## 2019-05-22 DIAGNOSIS — I10 Essential (primary) hypertension: Secondary | ICD-10-CM

## 2019-05-22 DIAGNOSIS — I25119 Atherosclerotic heart disease of native coronary artery with unspecified angina pectoris: Secondary | ICD-10-CM

## 2019-05-22 DIAGNOSIS — R0602 Shortness of breath: Secondary | ICD-10-CM | POA: Diagnosis not present

## 2019-05-22 DIAGNOSIS — I2510 Atherosclerotic heart disease of native coronary artery without angina pectoris: Secondary | ICD-10-CM | POA: Diagnosis not present

## 2019-05-22 DIAGNOSIS — E782 Mixed hyperlipidemia: Secondary | ICD-10-CM

## 2019-05-22 LAB — HEPATIC FUNCTION PANEL
ALT: 26 IU/L (ref 0–32)
AST: 35 IU/L (ref 0–40)
Albumin: 4.5 g/dL (ref 3.7–4.7)
Alkaline Phosphatase: 92 IU/L (ref 39–117)
Bilirubin Total: 0.3 mg/dL (ref 0.0–1.2)
Bilirubin, Direct: 0.08 mg/dL (ref 0.00–0.40)
Total Protein: 6.6 g/dL (ref 6.0–8.5)

## 2019-05-22 LAB — LIPID PANEL
Chol/HDL Ratio: 2.2 ratio (ref 0.0–4.4)
Cholesterol, Total: 128 mg/dL (ref 100–199)
HDL: 58 mg/dL (ref >39)
LDL Chol Calc (NIH): 49 mg/dL (ref 0–99)
Triglycerides: 121 mg/dL (ref 0–149)
VLDL Cholesterol Cal: 21 mg/dL (ref 5–40)

## 2019-05-22 NOTE — Patient Instructions (Signed)
Medication Instructions:  Your provider recommends that you continue on your current medications as directed. Please refer to the Current Medication list given to you today.    Labwork: You will have labs drawn when you return for your stress test.  Testing/Procedures: Your provider has requested that you have an echocardiogram. Echocardiography is a painless test that uses sound waves to create images of your heart. It provides your doctor with information about the size and shape of your heart and how well your heart's chambers and valves are working. This procedure takes approximately one hour. There are no restrictions for this procedure.    Your provider has requested that you have a lexiscan myoview. For further information please visit [email or web address]. Please follow instruction sheet, as given.  Follow-Up: Your provider recommends that you schedule a follow-up appointment in May, 2021 with Dr. Burt Knack.

## 2019-05-22 NOTE — Progress Notes (Signed)
Cardiology Office Note:    Date:  05/23/2019   ID:  Tracey [name], DOB [birth year], MRN [id number]  PCP:  [name], MD  Cardiologist:  Sherren Mocha, MD  Electrophysiologist:  None   Referring MD: [name], MD   Chief Complaint  Patient presents with  . Coronary Artery Disease    History of Present Illness:    Tracey [name] is a 73 y.o. female with a hx of coronary artery disease, presenting for follow-up evaluation.  The patient initially presented with a non-ST elevation MI in January 2019 when she was in [place].  She was treated with PCI of the LAD with overlapping drug-eluting stents.  A follow-up nuclear stress test in March 2019 demonstrated no ischemia.  Comorbid medical conditions include hypertension, mixed hyperlipidemia, osteoarthritis with history of knee replacement, and history of breast cancer.  She has had a history of mildly elevated liver function tests, but her most recent labs in August 2020 demonstrated normal findings with an AST of 30 and ALT of 25.  She is here alone today. She is short of breath with activity. Feels like she can't get through 9 holes of golf anymore. She had a chest XRay by her PCP and this showed no acute abnormalities. She plans to go back to [place] in December and she's concerned about her breathing when she returns to [place]. No orthopnea or PND. She has not had any exertional chest pain or pressure. When she had her NSTEMI, she describes her symptoms as a feeling of indigestion, severe heartburn, that radiated to her left shoulder.  She recently has had some episodic chest pain.  Symptoms have been self-limited and not related to physical exertion.  She has had pressure-like sensation in the left chest but this is been fairly transient without an exertional component.  Past Medical History:  Diagnosis Date  . Allergy   . Anxiety    takes Ativan daily prn  . Arthritis   . Breast cancer ([place]) 2014   ER+/PR+/HEr2-,    . Bruises easily   . Colon polyps    2 polyps by report  . Diverticulosis   . GERD (gastroesophageal reflux disease)    occasionally takes Nexium   . Gout    takes Allopurinol daily and Colchicine daily prn;last attack 9yr ago  . Gout   . History of bladder infections    many yrs ago  . History of bronchitis    last time at least 828yrago  . History of stress incontinence   . Hyperlipidemia    takes Pravastatin daily  . Insomnia    takes Ambien nightly prn  . NSTEMI (non-ST elevated myocardial infarction) ([facility]  . OSA (obstructive sleep apnea)    on CPAP  . Osteoarthritis   . Peripheral edema    takes Furosemide daily prn  . PONV (postoperative nausea and vomiting)   . Postmenopausal hormone therapy   . Radiation 07/27/13-09/07/13   Right Breast x 31 treatments  . Rhinitis    uses Flonase prn  . Sinus congestion   . Status post breast reconstruction 10/15/14   [place]  . Tachycardia    takes Metoprolol daily  . Ventricular tachycardia, non-sustained ([facility]   during sleep study 2009 with normal cardiac workup    Past Surgical History:  Procedure Laterality Date  . ABDOMINAL HYSTERECTOMY     with BSO  . APPENDECTOMY    .  AXILLARY SENTINEL NODE BIOPSY Right 05/23/2013   Procedure: AXILLARY SENTINEL NODE BIOPSY;  Surgeon: [name], MD;  Location: [facility];  Service: General;  Laterality: Right;  nuc med injection 7:00  . BREAST RECONSTRUCTION WITH PLACEMENT OF TISSUE EXPANDER AND FLEX HD (ACELLULAR HYDRATED DERMIS) Bilateral 05/23/2013   Procedure: BILATERAL BREAST RECONSTRUCTION WITH PLACEMENT OF TISSUE EXPANDER AND FLEX HD;  Surgeon: [name], MD;  Location: [facility];  Service: Plastics;  Laterality: Bilateral;  . CARDIAC CATHETERIZATION  1999  . CATARACT EXTRACTION, BILATERAL Bilateral   . CHOLECYSTECTOMY    . COSMETIC SURGERY    . OTHER SURGICAL HISTORY  09/2017   had cyst removal from spine   .  PERCUTANEOUS CORONARY STENT INTERVENTION (PCI-S)  08/2017   done in [place]    . REMOVAL OF BILATERAL TISSUE EXPANDERS WITH PLACEMENT OF BILATERAL BREAST IMPLANTS Bilateral 05/17/2014   Procedure: REMOVAL OF BILATERAL TISSUE EXPANDERS WITH PLACEMENT OF BILATERAL BREAST IMPLANTS FOR RECONSTRUCTION;  Surgeon: [name], MD;  Location: [facility];  Service: Plastics;  Laterality: Bilateral;  . TONSILLECTOMY    . TOTAL KNEE ARTHROPLASTY Right 07/26/2018   Procedure: TOTAL KNEE ARTHROPLASTY;  Surgeon: Paralee Cancel, MD;  [facility];  Service: Orthopedics;  Laterality: Right;  70 mins  . TOTAL MASTECTOMY Bilateral 05/23/2013   Procedure: TOTAL MASTECTOMY;  Surgeon: [name], MD;  Location: [facility];  Service: General;  Laterality: Bilateral;    Current Medications: Current Meds  Medication Sig  . allopurinol (ZYLOPRIM) 100 MG tablet TAKE 1 TABLET BY MOUTH ONCE DAILY  . apraclonidine (IOPIDINE) 0.5 % ophthalmic solution Place 1 drop into the right eye daily as needed (for droopy eye).   . ASPIRIN LOW DOSE 81 MG chewable tablet Chew 81 mg by mouth daily.   . celecoxib (CELEBREX) 200 MG capsule celecoxib 200 mg capsule  TAKE 1 CAPSULE BY MOUTH EVERY DAY  . Cholecalciferol (VITAMIN D3) 50 MCG (2000 UT) TABS Take 2,000 Units by mouth daily.  . clopidogrel (PLAVIX) 75 MG tablet Take 75 mg by mouth daily.   . colchicine 0.6 MG tablet TAKE 1 TABLET BY MOUTH EVERY DAY OR AS NEEDED  . cyanocobalamin (,VITAMIN B-12,) 1000 MCG/ML injection Inject 1,000 mcg into the muscle every 30 (thirty) days.  Marland Kitchen docusate sodium (COLACE) 100 MG capsule Take 1 capsule (100 mg total) by mouth 2 (two) times daily.  Marland Kitchen ezetimibe (ZETIA) 10 MG tablet TAKE 1 TABLET(10 MG) BY MOUTH DAILY  . fluticasone (FLONASE) 50 MCG/ACT nasal spray Place 1 spray into both nostrils daily.   . furosemide (LASIX) 40 MG tablet Take 40 mg by mouth daily as needed for fluid.   Marland Kitchen lisinopril (PRINIVIL,ZESTRIL) 10 MG tablet Take 10 mg by mouth  daily.   . metoprolol succinate (TOPROL XL) 25 MG 24 hr tablet Take 1 tablet (25 mg total) by mouth daily.  . Multiple Vitamin (MULTI-VITAMIN) tablet Take by mouth.  . NONFORMULARY OR COMPOUNDED ITEM Vitamin E vaginal cream 200u/ml.  One ml pv three times weekly.  . pantoprazole (PROTONIX) 40 MG tablet TAKE 1 TABLET(40 MG) BY MOUTH DAILY  . PROAIR HFA 108 (90 Base) MCG/ACT inhaler INHALE 1 TO 2 PUFFS BY MOUTH EVERY 6 HOURS AS NEEDED FOR 30 DAYS.  Marland Kitchen rosuvastatin (CRESTOR) 20 MG tablet Take 1 tablet (20 mg total) by mouth daily.  Marland Kitchen testosterone cypionate (DEPOTESTOSTERONE CYPIONATE) 200 MG/ML injection INJECT 1 ML INTO THE MUSCLE EVERY 28 DAYS. PATIENT IS DUE FOR APPOINTMENT  . venlafaxine  XR (EFFEXOR-XR) 150 MG 24 hr capsule TAKE 1 CAPSULE BY MOUTH DAILY WITH BREAKFAST     Allergies:   Pseudoeph-hydrocodone-gg, Ivp dye [iodinated diagnostic agents], Sulfa antibiotics, and Tape   Social History   Socioeconomic History  . Marital status: Married    Spouse name: Not on file  . Number of children: 2  . Years of education: Not on file  . Highest education level: Not on file  Occupational History  . Not on file  Social Needs  . Financial resource strain: Not on file  . Food insecurity    Worry: Not on file    Inability: Not on file  . Transportation needs    Medical: Not on file    Non-medical: Not on file  Tobacco Use  . Smoking status: Former Smoker    Packs/day: 1.00    Years: 20.00    Pack years: 20.00    Start date: 06/09/1992  . Smokeless tobacco: Never Used  . Tobacco comment: quit at age 74  Substance and Sexual Activity  . Alcohol use: Yes    Alcohol/week: 7.0 - 14.0 standard drinks    Types: 7 - 14 Glasses of wine per week  . Drug use: No    Comment: quit 24 years ago  . Sexual activity: Yes    Birth control/protection: Surgical  Lifestyle  . Physical activity    Days per week: Not on file    Minutes per session: Not on file  . Stress: Not on file  Relationships   . Social Herbalist on phone: Not on file    Gets together: Not on file    Attends religious service: Not on file    Active member of club or organization: Not on file    Attends meetings of clubs or organizations: Not on file    Relationship status: Not on file  Other Topics Concern  . Not on file  Social History Narrative  . Not on file     Family History: The patient's family history includes ALS in her sister; Breast cancer in her maternal aunt, mother, and another family member; Breast cancer (age of onset: 9) in her maternal grandmother; Heart attack in her father, maternal grandfather, and paternal grandfather; Heart disease in her father.  ROS:   Please see the history of present illness.    All other systems reviewed and are negative.  EKGs/Labs/Other Studies Reviewed:    Recent Labs: 08/17/2018: B Natriuretic Peptide 108.8 03/28/2019: BUN 21; Creatinine 1.15; Hemoglobin 13.5; Platelet Count 180; Potassium 5.1; Sodium 137 05/22/2019: ALT 26  Recent Lipid Panel    Component Value Date/Time   CHOL 128 05/22/2019 0756   TRIG 121 05/22/2019 0756   HDL 58 05/22/2019 0756   CHOLHDL 2.2 05/22/2019 0756   LDLCALC 49 05/22/2019 0756    Physical Exam:    VS:  BP 110/68   Pulse 68   Ht _0  (1.575 m)   Wt 159 lb 9.6 oz (72.4 kg)   LMP 08/17/1977 (Approximate)   SpO2 99%   BMI 29.19 kg/m     Wt Readings from Last 3 Encounters:  05/22/19 159 lb 9.6 oz (72.4 kg)  05/15/19 157 lb (71.2 kg)  03/28/19 159 lb 4.8 oz (72.3 kg)     GEN:  Well nourished, well developed in no acute distress HEENT: Normal NECK: No JVD; No carotid bruits LYMPHATICS: No lymphadenopathy CARDIAC: RRR, no murmurs, rubs, gallops RESPIRATORY:  Clear to auscultation  without rales, wheezing or rhonchi  ABDOMEN: Soft, non-tender, non-distended MUSCULOSKELETAL:  No edema; No deformity  SKIN: Warm and dry NEUROLOGIC:  Alert and oriented x 3 PSYCHIATRIC:  Normal affect   ASSESSMENT:     1. Essential hypertension   2. Mixed hyperlipidemia   3. Coronary artery disease involving native coronary artery of native heart with angina pectoris ([place])   4. Shortness of breath    PLAN:    In order of problems listed above:  1. Blood pressure is well controlled.  Current medical program will be continued. 2. Lipids are at goal with labs drawn this morning.  Last LDL cholesterol was 58 mg/dL.  She continues on a high intensity statin drug with rosuvastatin 20 mg daily. 3. The patient has atypical angina with known CAD.  She is on aspirin and clopidogrel for antiplatelet therapy.  I have recommended a Lexiscan stress Myoview to evaluate for ischemia.  Her last stress test from March 2019 is reviewed. 4. Progressive dyspnea noted.  I suspect this is multifactorial.  I am going to check an echocardiogram in light of her known coronary disease.  I do not appreciate any abnormalities on her cardiopulmonary examination.  Will add a BNP when she comes in for her stress test.  An outside chest x-ray is reviewed which showed no acute cardiopulmonary abnormality.  Ectasia of the thoracic aorta was noted incidentally.  I suspect weight gain is contributing and we trended her weights over the last year with significant weight gain noted.  She is going to work on lifestyle modification.   Medication Adjustments/Labs and Tests Ordered: Current medicines are reviewed at length with the patient today.  Concerns regarding medicines are outlined above.  Orders Placed This Encounter  Procedures  . Pro b natriuretic peptide (BNP)  . MYOCARDIAL PERFUSION IMAGING  . ECHOCARDIOGRAM COMPLETE   No orders of the defined types were placed in this encounter.   Patient Instructions  Medication Instructions:  Your provider recommends that you continue on your current medications as directed. Please refer to the Current Medication list given to you today.    Labwork: You will have labs drawn when you return  for your stress test.  Testing/Procedures: Your provider has requested that you have an echocardiogram. Echocardiography is a painless test that uses sound waves to create images of your heart. It provides your doctor with information about the size and shape of your heart and how well your heart's chambers and valves are working. This procedure takes approximately one hour. There are no restrictions for this procedure.    Your provider has requested that you have a lexiscan myoview. For further information please visit [email or web address]. Please follow instruction sheet, as given.  Follow-Up: Your provider recommends that you schedule a follow-up appointment in May, 2021 with Dr. Burt Knack.    Signed, Sherren Mocha, MD  05/23/2019 9:11 AM    [place]

## 2019-05-24 DIAGNOSIS — K580 Irritable bowel syndrome with diarrhea: Secondary | ICD-10-CM | POA: Diagnosis not present

## 2019-05-24 DIAGNOSIS — M549 Dorsalgia, unspecified: Secondary | ICD-10-CM | POA: Diagnosis not present

## 2019-05-24 DIAGNOSIS — Z86010 Personal history of colonic polyps: Secondary | ICD-10-CM | POA: Diagnosis not present

## 2019-05-25 ENCOUNTER — Telehealth (HOSPITAL_COMMUNITY): Payer: Self-pay

## 2019-05-25 ENCOUNTER — Telehealth: Payer: Self-pay | Admitting: Cardiovascular Disease

## 2019-05-25 ENCOUNTER — Telehealth (HOSPITAL_COMMUNITY): Payer: Self-pay | Admitting: Radiology

## 2019-05-25 MED ORDER — LISINOPRIL 10 MG PO TABS: 10.0000 mg | ORAL_TABLET | Freq: Every day | ORAL | 3 refills | Status: DC

## 2019-05-25 NOTE — Telephone Encounter (Signed)
Patient given detailed instructions per Myocardial Perfusion Study Information Sheet for the test on 10.13.20 at 7:15. Patient notified to arrive 15 minutes early and that it is imperative to arrive on time for appointment to keep from having the test rescheduled.  If you need to cancel or reschedule your appointment, please call the office within 24 hours of your appointment. . Patient verbalized understanding.EHK

## 2019-05-25 NOTE — Telephone Encounter (Signed)
New message    *STAT* If patient is at the pharmacy, call can be transferred to refill team.   1. Which medications need to be refilled? (please list name of each medication and dose if known) lisinopril (PRINIVIL,ZESTRIL) 10 MG tablet   2. Which pharmacy/location (including street and city if local pharmacy) is medication to be sent to?[place], [place] - [address] AT [place]  3. Do they need a 30 day or 90 day supply?[place]

## 2019-05-25 NOTE — Telephone Encounter (Signed)
Left message for patient refill has been sent. Instructed her to call with questions or concerns.

## 2019-05-25 NOTE — Telephone Encounter (Signed)
Attempted to reach the patient, left a message on his answering machine. Asked to call back with any questions. S.Dezarai Prew EMTP

## 2019-05-25 NOTE — Telephone Encounter (Signed)
Pt calling requesting a refill on lisinopril. Dr. Burt Knack did not prescribe this medication. Would Dr. Burt Knack like to refill this medication? Please address

## 2019-05-30 ENCOUNTER — Ambulatory Visit (HOSPITAL_BASED_OUTPATIENT_CLINIC_OR_DEPARTMENT_OTHER): Payer: Medicare Other

## 2019-05-30 ENCOUNTER — Other Ambulatory Visit: Payer: Self-pay

## 2019-05-30 ENCOUNTER — Ambulatory Visit (HOSPITAL_COMMUNITY): Payer: Medicare Other | Attending: Internal Medicine

## 2019-05-30 ENCOUNTER — Other Ambulatory Visit: Payer: Medicare Other

## 2019-05-30 DIAGNOSIS — I25119 Atherosclerotic heart disease of native coronary artery with unspecified angina pectoris: Secondary | ICD-10-CM

## 2019-05-30 DIAGNOSIS — R0602 Shortness of breath: Secondary | ICD-10-CM | POA: Diagnosis not present

## 2019-05-30 LAB — ECHOCARDIOGRAM COMPLETE
Height: 62 in
Weight: 2544 oz

## 2019-05-30 LAB — MYOCARDIAL PERFUSION IMAGING
LV dias vol: 52 mL (ref 46–106)
LV sys vol: 17 mL
Peak HR: 87 {beats}/min
Rest HR: 61 {beats}/min
SDS: 3
SRS: 0
SSS: 3
TID: 1.03

## 2019-05-30 LAB — PRO B NATRIURETIC PEPTIDE: NT-Pro BNP: 156 pg/mL (ref 0–301)

## 2019-05-30 MED ORDER — TECHNETIUM TC 99M TETROFOSMIN IV KIT
31.6000 | PACK | Freq: Once | INTRAVENOUS | Status: AC | PRN
  Administered 2019-05-30: 31.6 via INTRAVENOUS
  Filled 2019-05-30: qty 32

## 2019-05-30 MED ORDER — REGADENOSON 0.4 MG/5ML IV SOLN
0.4000 mg | Freq: Once | INTRAVENOUS | Status: AC
  Administered 2019-05-30: 0.4 mg via INTRAVENOUS

## 2019-05-30 MED ORDER — TECHNETIUM TC 99M TETROFOSMIN IV KIT
10.2000 | PACK | Freq: Once | INTRAVENOUS | Status: AC | PRN
  Administered 2019-05-30: 10.2 via INTRAVENOUS
  Filled 2019-05-30: qty 11

## 2019-05-31 DIAGNOSIS — M25562 Pain in left knee: Secondary | ICD-10-CM | POA: Diagnosis not present

## 2019-05-31 DIAGNOSIS — M1712 Unilateral primary osteoarthritis, left knee: Secondary | ICD-10-CM | POA: Diagnosis not present

## 2019-06-05 ENCOUNTER — Other Ambulatory Visit: Payer: Self-pay

## 2019-06-05 ENCOUNTER — Encounter: Payer: Self-pay | Admitting: Neurology

## 2019-06-05 ENCOUNTER — Ambulatory Visit (INDEPENDENT_AMBULATORY_CARE_PROVIDER_SITE_OTHER): Payer: Medicare Other | Admitting: Neurology

## 2019-06-05 VITALS — BP 121/70 | HR 70 | Temp 97.6°F | Ht 62.0 in | Wt 157.0 lb

## 2019-06-05 DIAGNOSIS — R4189 Other symptoms and signs involving cognitive functions and awareness: Secondary | ICD-10-CM | POA: Diagnosis not present

## 2019-06-05 DIAGNOSIS — I25119 Atherosclerotic heart disease of native coronary artery with unspecified angina pectoris: Secondary | ICD-10-CM | POA: Diagnosis not present

## 2019-06-05 NOTE — Progress Notes (Signed)
SLEEP MEDICINE CLINIC   Provider:  Larey Seat, M D  Referring Provider: [name], MD Primary Care Physician:  [name], MD  Chief Complaint  Patient presents with  . Follow-up    pt alone. rm 10. pt states that things are going well. [facility]    HPI:  Tracey [name] is a 73 y.o. female , seen here as a referral from Dr. Ardeth Perfect - there  She fell out of bed, had to get stitches in the ED- she has more and more dream enactment, often nightmares, and not leaving the bed. She had vivd dreams since childhood, and her grandson has night terrors. She also hears music when there is none, but only when a mechanical noise is in the background.  She never had tinnitus, but a sound as if a radio produces  static sounds was perceived.  On December10th 2019 she had a knee surgery, and the pain medications was giving her horrible delusions- she was terrified. Complex visual hallucinations - these were visions of people, snakes, no voices. People that looked into the window. Delirium.  She called her MD and the police, and her husband was helpfess.   She is OK with her apnea treatment. This year has been quiet , no travels due to Covid 19, and she enjoyed being home - sad only because she missed a family re-union.  Her son died in 06-09-2023- of substance abuse.   Tracey [name] provided me with a high compliance record 93.3% for the last 30 days prior to 29 May 2019.  Average use a time for all days 7 hours 55 minutes, mean pressure of CPAP 9.1 average device pressure 11.4 average time in large leak 17 minutes average AHI 6.4.  As it is a 6.4 is related to the leak time however we do not need to change settings.   First visit upon referral with Dr Ardeth Perfect. Chief complaint according to patient : " re evaluate my apnea and care." The patient reports that she moved to the [place] area about 3 years ago and still gets supplies for her established CPAP per Mail. She was  diagnosed with obstructive sleep apnea in [place], in [place]. [facility] posted the sleep center. The patient was diagnosed on 05/02/2008 the study revealed an AHI of 23 was mild decreases in oxygen level as well as continuous snoring. Then CPAP was used for the second night of the split night polysomnography and her oxygen level improved to normal her snoring resolved and the breathing was totally regulated according to Dr. Shelton Silvas note. Concerning was that there was a ventricular tachycardia as of 27 beats. The patient was evaluated by cardiology afterwards the finding could never be reciprocated. She continued to use CPAP for the last 7 years compliantly but she is a little tired of using a nasal mask that has become uncomfortable and his pressure marks on her face. She is also not sure to what degree apnea still present at this time. The patient stays about 7 months of the year here in [place], and 5 months in [place]. Usually they travel from Thanksgiving to Easter. Her husband witnesses snoring when she naps, but not while on CPAP. Her insomnia improved drastically after CPAP.   Sleep habits are as follows: The patient goes to bed between 9.30 and 10 PM, usually falls asleep promptly. She prefers the left side to sleep , but wakes sometimes up on her back.  She learned to sleep  on her back after breast cancer surgery ( diagnosed Aug 8th 2014 ). The bedroom is described as core, quiet and dark. The patient shares a bedroom with her husband. They're also 2 dogs in the bedroom on the bed. She rises usually once to go to the bathroom sometimes twice. She can fall asleep again fairly promptly. She is not a restless sleeper not kicking or moving or not excessively at night. She rises in the morning at 5 sometimes even 8. She usually wakes spontaneously not based on an alarm setting. She averages 7-8 hours of nocturnal sleep.  She reports vivid dreams, "repetitive and in full  color".  Some mornings she feels refreshed and restored but not all the time. She is still recovering from her breast reconstruction surgery. She endorsed some snoring, joint pain, aching muscles and even some dizziness. She also has right shoulder bursitis which limits her ability to sleep on the right. She naps 3 out of 7 days, once  daily, a nap will last  2 hours unless she sets an alarm.   Tracey [name] this past medical history includes gout, hyperlipidemia, hypertension, palpitations probably due to nonsustained ventricular tachycardia. In addition breast cancer of which her mother maternal aunt maternal grandmother and maternal great-grandmother were also affected. She is a history of allergic rhinitis her sister died in 2011-11-06 of ALS. Sleep medical history and family sleep history: Her father was diagnosed with OSA never used CPAP ,  had an MI at age 15. Social history: non smoker, quit at age 36. ETOH , 1-2 at night. Caffeine use : none. Decaffeinated tea and coffee. Average and regular golf player. She likes winter sports, especially skiing but uses the left dangerous slope these days.  Interval history from 05-20-15. Hearing has been willing to undergo a new sleep study and attended nocturnal polysomnography on 04-09-15. She was diagnosed with a very mild AHI of 5.2 , supine sleep  AHI became 12.4  and it was further evident that the patient did not get into REM sleep. For this reason I feel it is much more important that the patient stays on CPAP given that her apnea was mild and she was fitted with a new mask. The new mass does not cause pressure marks around her nose and does not entangle her hair. She is using a dream wear interface, which [facility] had to explicitly order for her.  Interval history from 12/23/2015. Tracey [name] has happily returned from [place], I have the pleasure today to see her CPAP download which confirms and 96.7% compliance average user time 6 hours and 39 minutes of  CPAP.The device is a C flex set at 8 cm water pressure with a ramp time of 30 minutes. The REM time is a little too long and we will reduce it to 10 minutes. Her husband has made her aware that she still snores when she doesn't use a CPAP. She has become more compliant she endorsed only 2 points today on the geriatric depression store, 7 points on the Epworth sleepiness score and the fatigue severity at 29.  Interval history from 02/26/2016. Tracey [name] is here today relaxed after a week vacation. She reports sleeping so much better with the new CPAP machine. Her sleep is deeper but she does not recall having nightmares she has fallen out of bed 1 night it was actually the third night she used the new CPAP. She feels her sleep is less fragmented, more sound, and more refreshing and restorative. Her  download revealed 100% compliance over the last 30 days was 97% compliance 4 hours of use, average user time is 7 hours and 41 minutes. This is excellent compliance with CPAP is set at 8 cm water we did  use a 10 minute ramp function. Her snoring has of course been alleviated using CPAP, which pleases her husband. She has a residual AHI of 8.1 but given her decrease and sleepiness/ fatigue I would not want to change the settings.  Interval history from 12/21/2016, Tracey [name] has been to winter in [place] and had a additional medication in [place]. She is now doing well but during her winter stay in [place] she developed shortness of breath and apparently more severe hypoxemia and apnea. She saw a pulmonologist at the location would tested her oxygen levels while walking and also diagnosed her with type a flu. She had been coughing for over 6 weeks when the flu finally converted to bronchitis and then resolved. She remained air hungry and short of breath for much of her winter time. I'm able to state today that she feels back to her baseline. But we need to discuss how we could prevent altitude related apneas which  can often be central in nature and if she should use an AutoPap with an automatic pressure window. The patient is a very highly compliant CPAP user her compliance rate was 93.3% with an average user time of 7 hours and 6 minutes. She has some air leaks but not significant once CPAP is set at 8 cm water pressure with a residual AHI of 17.4. She has a short ramp time. Most of the AHI is related to shallow breathing. Her average central apnea index is 1.1 and obstructive apnea index is 3.3. Our goal is is to have an AHI of below 5. I will ask [facility] to change her to an auto titration from 5-10 cm water. She continues to use a dream wear interface. She would like the non-prong version of the dream wear interface. We will also meet again in late September or October to discuss if she needs additional oxygen at night during her stays at high altitudes.  05-24-2017, Tracey [name] is planning again to spend the winter in [place]. She asked me to make sure that she will have oxygen available for nightly use in the upper inner air. Last year she stated she was miserable on CPAP alone. She is a very compliant CPAP use of his 100% compliance and an average user time of 9 hours and 2 minutes, uses the machine as an auto CPAP between 5 and 15 cm water, average pressure is 9.9 cm water and the average residual AHI is 5.1. The machine allows between 5 and 10 cm water pressure only, given that the patient does not have central apneas I will increase the upper limits to 12 cm water. I would also investigate is able care can help [place] with oxygen in [place] ( she knows an oxygen supplier in [place]- "[employer]" ) .   05-30-2018, The patient suffered a heart attack in January of this year, and had a rough year- needs bilateral knee replacements. Her step- son died 64 days ago at age 92 years , with alcoholism and bipolar disease, died of multi organ -liver failure. She brought her CPAP machine - and would like  to change her interface .she wants to return to a pillairo or nasal pillow. Her machine was issued in  August 2016- after a AHI of only  5.2- but loud snoring and RERAS. UARS. Residual AHI was 7/h at 90% pressure of 10.2 cm water.  Compliance was 87% for time, and 26/ 30 days.    Review of Systems: Out of a complete 14 system review, the patient complains of only the following symptoms, and all other reviewed systems are negative.Snoring. Grieving - lost mother and sister to breast cancer in a short time and was diagnosed with breast cancer herself 3.5 years ago.  On antihormone therapy. She takes statins.  Epworth score 9/24  , Fatigue severity score again at  41/ 63, geriatric depression score 2 ,  her machine is 74 years old . Nasal pillow- dream wear causes hair trouble- will switch back to a nasal pillow.  Settings are AUTO on a dream station, settings are at 5-12 with 2 cm water EPR.    Social History   Socioeconomic History  . Marital status: Married    Spouse name: Not on file  . Number of children: 2  . Years of education: Not on file  . Highest education level: Not on file  Occupational History  . Not on file  Social Needs  . Financial resource strain: Not on file  . Food insecurity    Worry: Not on file    Inability: Not on file  . Transportation needs    Medical: Not on file    Non-medical: Not on file  Tobacco Use  . Smoking status: Former Smoker    Packs/day: 1.00    Years: 20.00    Pack years: 20.00    Start date: 06/09/1992  . Smokeless tobacco: Never Used  . Tobacco comment: quit at age 25  Substance and Sexual Activity  . Alcohol use: Yes    Alcohol/week: 7.0 - 14.0 standard drinks    Types: 7 - 14 Glasses of wine per week  . Drug use: No    Comment: quit 24 years ago  . Sexual activity: Yes    Birth control/protection: Surgical  Lifestyle  . Physical activity    Days per week: Not on file    Minutes per session: Not on file  . Stress: Not on file   Relationships  . Social Herbalist on phone: Not on file    Gets together: Not on file    Attends religious service: Not on file    Active member of club or organization: Not on file    Attends meetings of clubs or organizations: Not on file    Relationship status: Not on file  . Intimate partner violence    Fear of current or ex partner: Not on file    Emotionally abused: Not on file    Physically abused: Not on file    Forced sexual activity: Not on file  Other Topics Concern  . Not on file  Social History Narrative  . Not on file    Family History  Problem Relation Age of Onset  . Breast cancer Mother        possible inflammatory breast cancer  . Heart attack Father   . Heart disease Father   . ALS Sister   . Breast cancer Maternal Grandmother 72  . Heart attack Maternal Grandfather   . Heart attack Paternal Grandfather   . Breast cancer Maternal Aunt        dx in her 48s  . Breast cancer Other        maternal great grandmother; dx in her 58s  Past Medical History:  Diagnosis Date  . Allergy   . Anxiety    takes Ativan daily prn  . Arthritis   . Breast cancer ([facility]) 2014   ER+/PR+/HEr2-,   . Bruises easily   . Colon polyps    2 polyps by report  . Diverticulosis   . GERD (gastroesophageal reflux disease)    occasionally takes Nexium   . Gout    takes Allopurinol daily and Colchicine daily prn;last attack 36yr ago  . Gout   . History of bladder infections    many yrs ago  . History of bronchitis    last time at least 84yrago  . History of stress incontinence   . Hyperlipidemia    takes Pravastatin daily  . Insomnia    takes Ambien nightly prn  . NSTEMI (non-ST elevated myocardial infarction) ([facility]  . OSA (obstructive sleep apnea)    on CPAP  . Osteoarthritis   . Peripheral edema    takes Furosemide daily prn  . PONV (postoperative nausea and vomiting)   . Postmenopausal hormone therapy   . Radiation 07/27/13-09/07/13   Right  Breast x 31 treatments  . Rhinitis    uses Flonase prn  . Sinus congestion   . Status post breast reconstruction 10/15/14   [place]  . Tachycardia    takes Metoprolol daily  . Ventricular tachycardia, non-sustained ([facility]   during sleep study 2009 with normal cardiac workup    Past Surgical History:  Procedure Laterality Date  . ABDOMINAL HYSTERECTOMY     with BSO  . APPENDECTOMY    . AXILLARY SENTINEL NODE BIOPSY Right 05/23/2013   Procedure: AXILLARY SENTINEL NODE BIOPSY;  Surgeon: ToOdis HollingsheadMD;  [facility]: [facility];  Laterality: Right;  nuc med injection 7:00  . BREAST RECONSTRUCTION WITH PLACEMENT OF TISSUE EXPANDER AND FLEX HD (ACELLULAR HYDRATED DERMIS) Bilateral 05/23/2013   Procedure: BILATERAL BREAST RECONSTRUCTION WITH PLACEMENT OF TISSUE EXPANDER AND FLEX HD;  Surgeon: DaCrissie ReeseMD;  [facility]: [facility];  Laterality: Bilateral;  . CARDIAC CATHETERIZATION  1999  . CATARACT EXTRACTION, BILATERAL Bilateral   . CHOLECYSTECTOMY    . COSMETIC SURGERY    . OTHER SURGICAL HISTORY  09/2017   had cyst removal from spine   . PERCUTANEOUS CORONARY STENT INTERVENTION (PCI-S)  [place]2017   [place]  . REMOVAL OF BILATERAL TISSUE EXPANDERS WITH PLACEMENT OF BILATERAL BREAST IMPLANTS Bilateral 05/17/2014   Procedure: REMOVAL OF BILATERAL TISSUE EXPANDERS WITH PLACEMENT OF BILATERAL BREAST IMPLANTS FOR RECONSTRUCTION;  Surgeon: DaCrissie ReeseMD;  [facility]: [facility];  Laterality: Bilateral;  . TONSILLECTOMY    . TOTAL KNEE ARTHROPLASTY Right 07/26/2018   Procedure: TOTAL KNEE ARTHROPLASTY;  Surgeon: OlParalee CancelMD;  Location[facility];  Service: Orthopedics;  Laterality: Right;  70 mins  . TOTAL MASTECTOMY Bilateral 05/23/2013   Procedure: TOTAL MASTECTOMY;  Surgeon: ToOdis HollingsheadMD;  [facility]: General;  Laterality: Bilateral;    Current Outpatient  Medications  Medication Sig Dispense Refill  . allopurinol (ZYLOPRIM) 100 MG tablet TAKE 1 TABLET BY MOUTH ONCE DAILY 90 tablet 0  . apraclonidine (IOPIDINE) 0.5 % ophthalmic solution Place 1 drop into the right eye daily as needed (for droopy eye).   2  . ASPIRIN LOW DOSE 81 MG chewable tablet Chew 81 mg by mouth daily.   0  .  celecoxib (CELEBREX) 200 MG capsule celecoxib 200 mg capsule  TAKE 1 CAPSULE BY MOUTH EVERY DAY    . Cholecalciferol (VITAMIN D3) 50 MCG (2000 UT) TABS Take 2,000 Units by mouth daily.    . clopidogrel (PLAVIX) 75 MG tablet Take 75 mg by mouth daily.   0  . colchicine 0.6 MG tablet TAKE 1 TABLET BY MOUTH EVERY DAY OR AS NEEDED 30 tablet 0  . cyanocobalamin (,VITAMIN B-12,) 1000 MCG/ML injection Inject 1,000 mcg into the muscle every 30 (thirty) days.    Marland Kitchen docusate sodium (COLACE) 100 MG capsule Take 1 capsule (100 mg total) by mouth 2 (two) times daily. 10 capsule 0  . ezetimibe (ZETIA) 10 MG tablet TAKE 1 TABLET(10 MG) BY MOUTH DAILY 90 tablet 0  . fluticasone (FLONASE) 50 MCG/ACT nasal spray Place 1 spray into both nostrils daily.     . furosemide (LASIX) 40 MG tablet Take 40 mg by mouth daily as needed for fluid.     Marland Kitchen lisinopril (ZESTRIL) 10 MG tablet Take 1 tablet (10 mg total) by mouth daily. 90 tablet 3  . metoprolol succinate (TOPROL XL) 25 MG 24 hr tablet Take 1 tablet (25 mg total) by mouth daily. 90 tablet 3  . Multiple Vitamin (MULTI-VITAMIN) tablet Take by mouth.    . NONFORMULARY OR COMPOUNDED ITEM Vitamin E vaginal cream 200u/ml.  One ml pv three times weekly. 36 each 1  . pantoprazole (PROTONIX) 40 MG tablet TAKE 1 TABLET(40 MG) BY MOUTH DAILY 90 tablet 1  . PROAIR HFA 108 (90 Base) MCG/ACT inhaler INHALE 1 TO 2 PUFFS BY MOUTH EVERY 6 HOURS AS NEEDED FOR 30 DAYS.    Marland Kitchen rosuvastatin (CRESTOR) 20 MG tablet Take 1 tablet (20 mg total) by mouth daily. 90 tablet 0  . testosterone cypionate (DEPOTESTOSTERONE CYPIONATE) 200 MG/ML injection INJECT 1 ML INTO THE  MUSCLE EVERY 28 DAYS. PATIENT IS DUE FOR APPOINTMENT    . venlafaxine XR (EFFEXOR-XR) 150 MG 24 hr capsule TAKE 1 CAPSULE BY MOUTH DAILY WITH BREAKFAST 90 capsule 1   No current facility-administered medications for this visit.     Allergies as of 06/05/2019 - Review Complete 06/05/2019  Allergen Reaction Noted  . Pseudoeph-hydrocodone-gg Other (See Comments) 02/03/2019  . Ivp dye [iodinated diagnostic agents] Hives 06/09/2012  . Sulfa antibiotics Swelling 06/09/2012  . Tape Other (See Comments) 07/04/2018    Vitals: BP 121/70   Pulse 70   Temp 97.6 F (36.4 C)   Ht 5' 2"  (1.575 m)   Wt 157 lb (71.2 kg)   LMP 08/17/1977 (Approximate)   BMI 28.72 kg/m  Last Weight:  Wt Readings from Last 1 Encounters:  06/05/19 157 lb (71.2 kg)   FXJ:OITG mass index is 28.72 kg/m.     Last Height:   Ht Readings from Last 1 Encounters:  06/05/19 5' 2"  (1.575 m)    Physical exam:  General: The patient is awake, alert and appears not in acute distress. The patient is well groomed. She reports sweating a lot.  Head: Normocephalic, atraumatic. Neck is supple. Mallampati 4,  neck circumference: 16.25  Nasal airflow unrestricted, TMJ is not evident. Retrognathia is not seen. She has a crowded lower jar. Cardiovascular:  Regular rate and rhythm , without  murmurs or carotid bruit, and without distended neck veins. Respiratory: Lungs are clear to auscultation.Skin:  Without evidence of edema, or rashTrunk: BMI is 28. The patient's posture is erect.   Neurologic exam :Speech is fluent,  without  dysarthria, dysphonia or aphasia. Mood and affect are appropriate.  Cranial nerves:Pupils are equal and briskly reactive to light. Extraocular movement are intact. Hearing to finger rub intact. Facial sensation intact to fine touch.Facial motor strength is symmetric and tongue and uvula moves midline. Shoulder shrug was symmetrical. She is very tense over the shoulder area.      The patient was advised  of the nature of the diagnosed sleep disorder , the treatment options and risks for general a health and wellness arising from not treating the condition.   I spent more than 25 minutes of face to face time with the patient. Greater than 50% of time was spent in counseling and coordination of care. We have discussed the diagnosis and differential and I answered the patient's questions.    She fell out of bed, had to get stitches in the ED- she has more and more dream enactment, often nightmares, and not leaving the bed.  She had vivid dreams since childhood, and her grandson has night terrors. She also hears music when there is none, but only when a mechanical noise is in the background.  She never had tinnitus, but a sound as if a radio produces  static sounds was perceived.     I ordered not to change her CPAP, but I will recommend melatonin for her - to suppress REM BD.  No signs of PD, no cog-wheeling. She has 2 cousins with PD.     Asencion Partridge Adrinne Sze MD  06/05/2019   CC: [name], [place],  [place] [id number]

## 2019-06-05 NOTE — Patient Instructions (Signed)
Melatonin oral solution What is this medicine? MELATONIN (mel uh TOH nin) is a dietary supplement. It is promoted to help maintain normal sleep patterns. The FDA has not approved this supplement for any medical use. This supplement may be used for other purposes; ask your health care provider or pharmacist if you have questions. This medicine may be used for other purposes; ask your health care provider or pharmacist if you have questions. What should I tell my health care provider before I take this medicine? They need to know if you have any of these conditions:  cancer  depression or mental illness  diabetes  hormone problems  if you often drink alcohol  immune system problems  liver disease  organ transplant  seizure disorder  an unusual or allergic reaction to melatonin, other medicines, foods, dyes, or preservatives  pregnant or trying to get pregnant  breast-feeding How should I use this medicine? Take this supplement by mouth with a glass of water. Do not take with food. Use a specially marked spoon or container to measure each dose. Ask your pharmacist if you do not have one. Household spoons are not accurate. This supplement is usually taken 1 or 2 hours before bedtime. After taking this supplement, limit your activities to those needed to prepare for bed. Follow the directions on the package labeling, or take as directed by your health care professional. Do not take this supplement more often than directed. Talk to your pediatrician regarding the use of this supplement in children. Special care may be needed. This supplement is not recommended for use in children without a prescription. Overdosage: If you think you have taken too much of this medicine contact a poison control center or emergency room at once. NOTE: This medicine is only for you. Do not share this medicine with others. What if I miss a dose? If you miss a dose, take it as soon as you can. If it is  almost time for your next dose, take only that dose. Do not take double or extra doses. What may interact with this medicine? Do not take this medicine with any of the following medications:  fluvoxamine  ramelteon  tasimelteon This medicine may also interact with the following medications:  alcohol  caffeine  carbamazepine  certain antibiotics like ciprofloxacin  certain medicines for depression, anxiety, or psychotic disturbances  cimetidine  female hormones, like estrogens and birth control pills, patches, rings, or injections  methoxsalen  nifedipine  other medications for sleep  other herbal or dietary supplements  phenobarbital  rifampin  smoking tobacco  tamoxifen  warfarin This list may not describe all possible interactions. Give your health care provider a list of all the medicines, herbs, non-prescription drugs, or dietary supplements you use. Also tell them if you smoke, drink alcohol, or use illegal drugs. Some items may interact with your medicine. What should I watch for while using this medicine? If you are already being treated for insomnia use this supplement only by your doctor's direction. This supplement may interfere with other treatments. See your doctor if your symptoms do not get better or if they get worse. You may get drowsy or dizzy. Do not drive, use machinery, or do anything that needs mental alertness until you know how this medicine affects you. Do not stand or sit up quickly, especially if you are an older patient. This reduces the risk of dizzy or fainting spells. Alcohol may interfere with the effect of this medicine. Avoid alcoholic drinks. After  taking this medicine, you may get up out of bed and do an activity that you do not know you are doing. The next morning, you may have no memory of this. Activities include driving a car ("sleep-driving"), making and eating food, talking on the phone, sexual activity, and sleep-walking. Serious  injuries have occurred. Call your doctor right away if you find out you have done any of these activities. Do not take this medicine if you have used alcohol that evening. Do not take it if you have taken another medicine for sleep. The risk of doing these sleep-related activities is higher. Herbal or dietary supplements are not regulated like medicines. Rigid quality control standards are not required for dietary supplements. The purity and strength of these products can vary. The safety and effect of this dietary supplement for a certain disease or illness is not well known. This product is not intended to diagnose, treat, cure or prevent any disease. The Food and Drug Administration suggests the following to help consumers protect themselves:  Always read product labels and follow directions.  Natural does not mean a product is safe for humans to take.  Look for products that include USP after the ingredient name. This means that the manufacturer followed the standards of the [place] Pharmacopoeia.  Supplements made or sold by a nationally known food or drug company are more likely to be made under tight controls. You can write to the company for more information about how the product was made. What side effects may I notice from receiving this medicine? Side effects that you should report to your doctor or health care professional as soon as possible:  allergic reactions like skin rash, itching or hives, swelling of the face, lips, or tongue  breathing problems  confusion  depressed mood, irritable, or other changes in moods or behaviors  feeling faint or lightheaded, falls  increased blood pressure  irregular or missed menstrual periods  signs and symptoms of liver injury like dark yellow or brown urine; general ill feeling or flu-like symptoms; light-colored stools; loss of appetite; nausea; right upper belly pain; unusually weak or tired; yellowing of the eyes or skin  trouble staying  awake or alert during the day  unusual activities while you are still asleep like driving, eating, making phone calls  unusual bleeding or bruising Side effects that usually do not require medical attention (report to your doctor or health care professional if they continue or are bothersome):  dizziness  drowsiness  headache  hot flashes  nausea  tiredness  unusual dreams or nightmares  upset stomach This list may not describe all possible side effects. Call your doctor for medical advice about side effects. You may report side effects to FDA at 1-800-FDA-1088. Where should I keep my medicine? Keep out of the reach of children. Store as directed on the package label. Throw away any unused supplement after the expiration date. NOTE: This sheet is a summary. It may not cover all possible information. If you have questions about this medicine, talk to your doctor, pharmacist, or health care provider.  2020 Elsevier/Gold Standard (2018-01-28 12:12:11)

## 2019-06-14 ENCOUNTER — Telehealth: Payer: Self-pay | Admitting: *Deleted

## 2019-06-14 MED ORDER — PANTOPRAZOLE SODIUM 40 MG PO TBEC: 40.0000 mg | DELAYED_RELEASE_TABLET | Freq: Every day | ORAL | 3 refills | Status: DC

## 2019-06-14 NOTE — Telephone Encounter (Signed)
Refill

## 2019-06-27 DIAGNOSIS — I25118 Atherosclerotic heart disease of native coronary artery with other forms of angina pectoris: Secondary | ICD-10-CM | POA: Diagnosis not present

## 2019-06-27 DIAGNOSIS — R05 Cough: Secondary | ICD-10-CM | POA: Diagnosis not present

## 2019-06-27 DIAGNOSIS — R140 Abdominal distension (gaseous): Secondary | ICD-10-CM | POA: Diagnosis not present

## 2019-06-27 DIAGNOSIS — M549 Dorsalgia, unspecified: Secondary | ICD-10-CM | POA: Diagnosis not present

## 2019-06-29 ENCOUNTER — Other Ambulatory Visit: Payer: Self-pay | Admitting: Internal Medicine

## 2019-06-29 DIAGNOSIS — R140 Abdominal distension (gaseous): Secondary | ICD-10-CM

## 2019-07-07 ENCOUNTER — Ambulatory Visit
Admission: RE | Admit: 2019-07-07 | Discharge: 2019-07-07 | Disposition: A | Discharge status: Home / Self Care | Payer: Medicare Other | Source: Ambulatory Visit | Attending: Internal Medicine | Admitting: Internal Medicine

## 2019-07-07 DIAGNOSIS — R140 Abdominal distension (gaseous): Secondary | ICD-10-CM | POA: Diagnosis not present

## 2019-07-07 DIAGNOSIS — R1011 Right upper quadrant pain: Secondary | ICD-10-CM | POA: Diagnosis not present

## 2019-07-07 IMAGING — US US ABDOMEN LIMITED
1 series · 14 of 25 positions shown · non-contrast
Comparison: None

CLINICAL DATA: Abdominal distension, RIGHT upper quadrant pain for
1 year

EXAM:
ULTRASOUND ABDOMEN LIMITED RIGHT UPPER QUADRANT

[Series 1: us abdomen limited · 0.25mm/px · 14 of 37 slices shown]
[im 1/37]
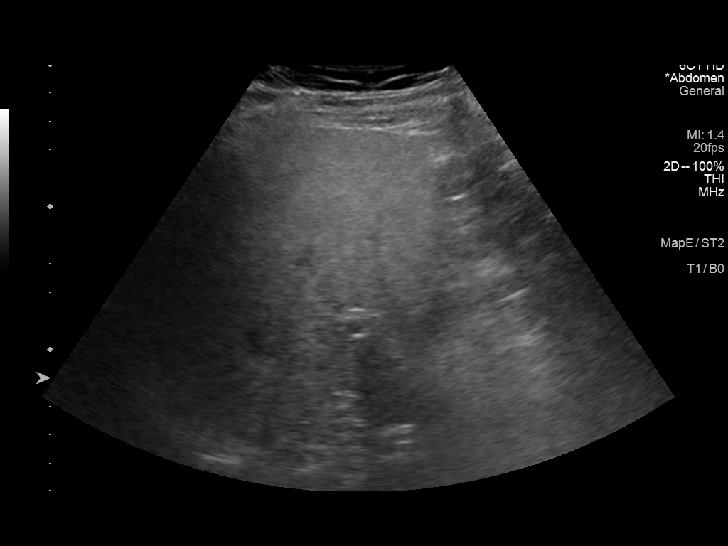
[im 4/37]
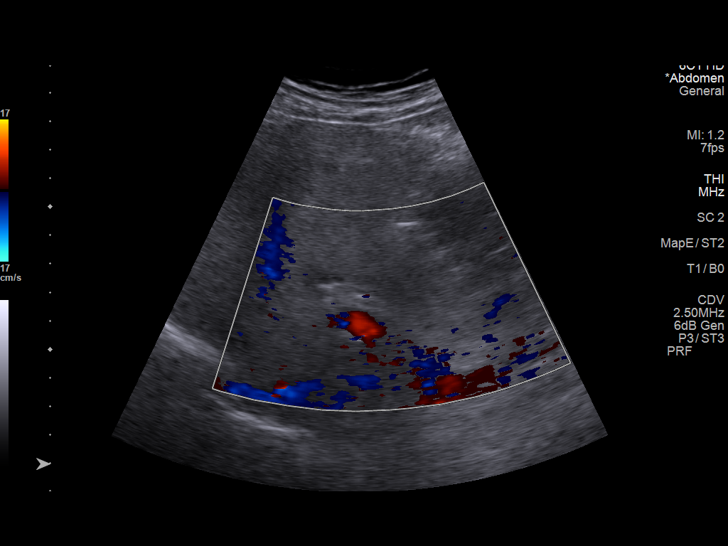
[im 7/37]
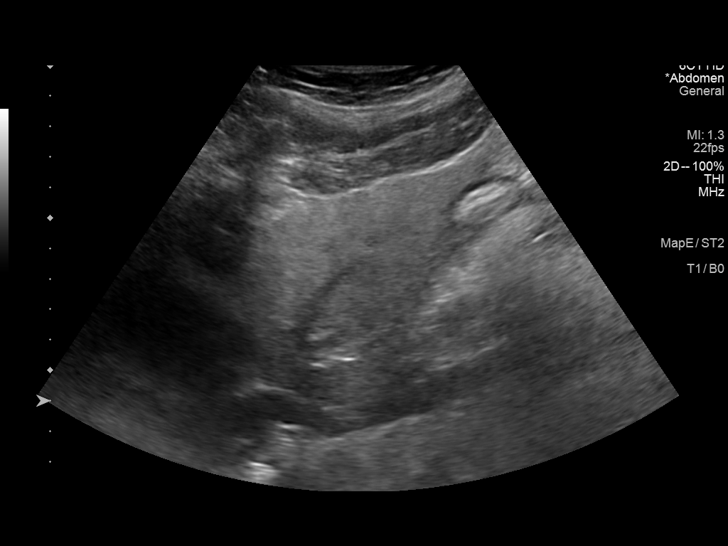
[im 10/37]
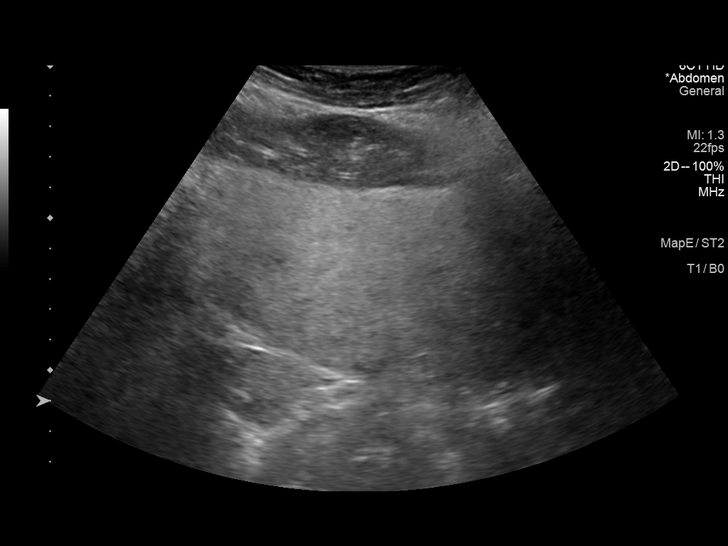
[im 13/37]
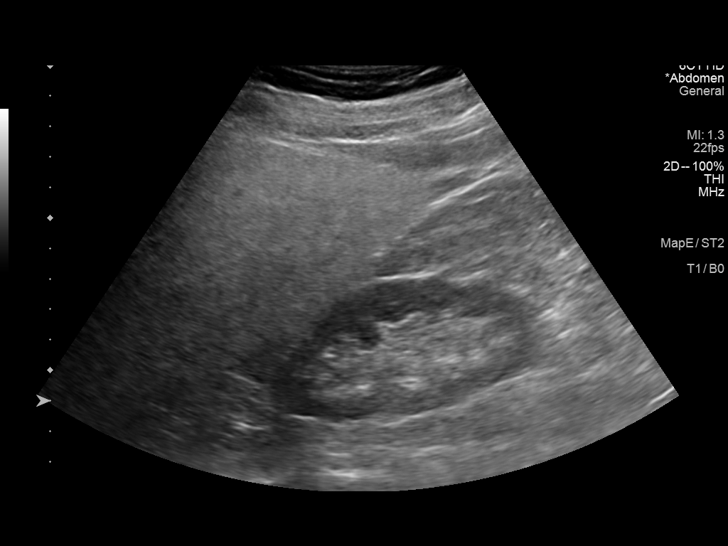
[im 14/37]
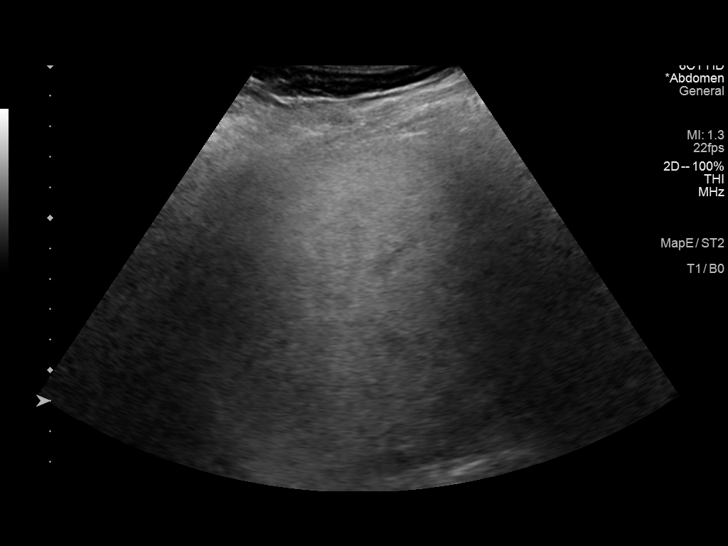
[im 17/37]
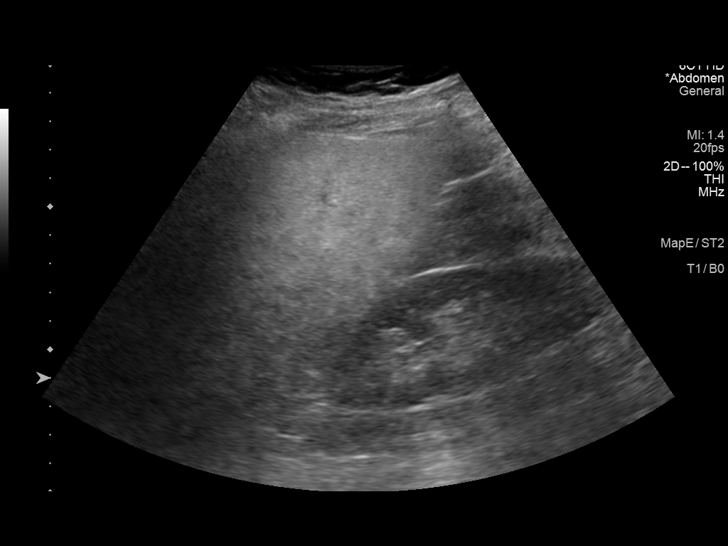
[im 20/37]
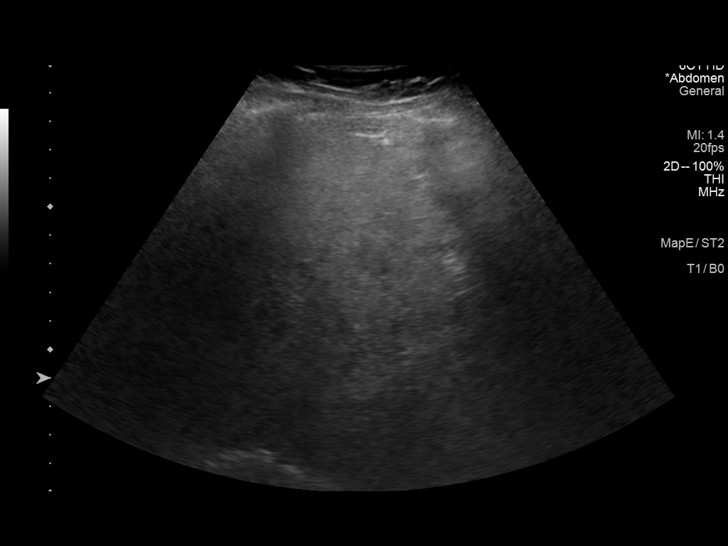
[im 23/37]
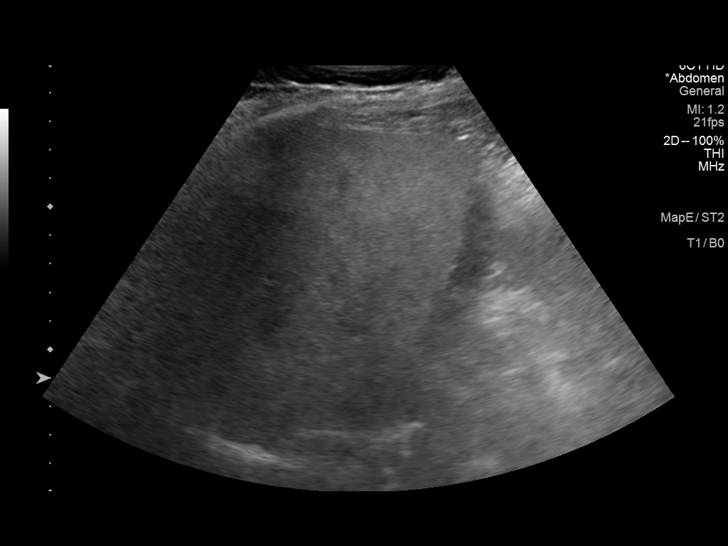
[im 25/37]
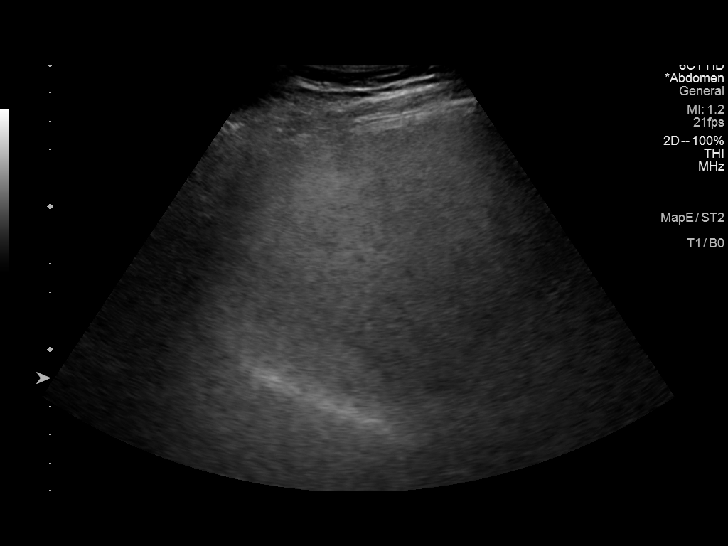
[im 28/37]
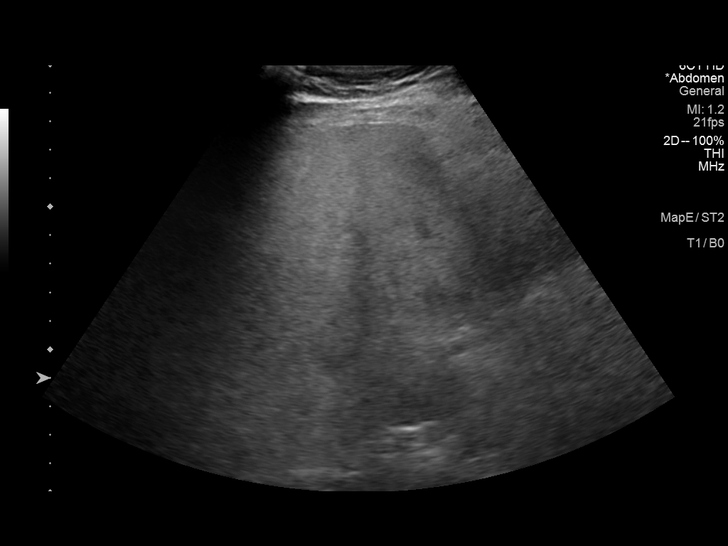
[im 31/37]
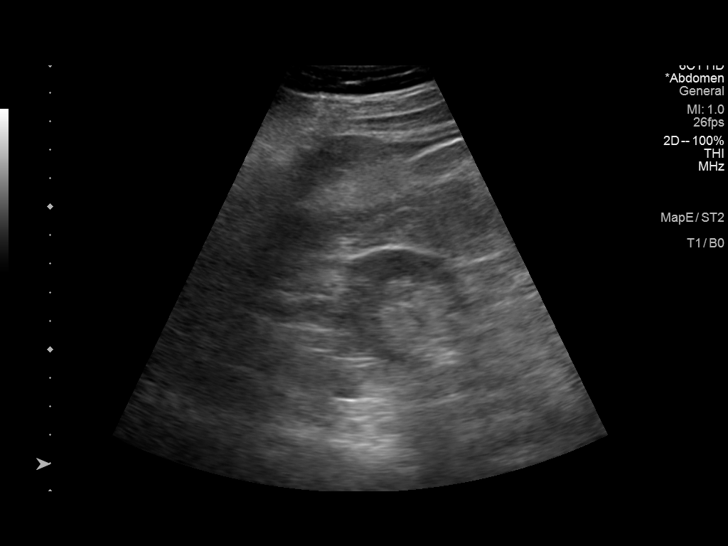
[im 34/37]
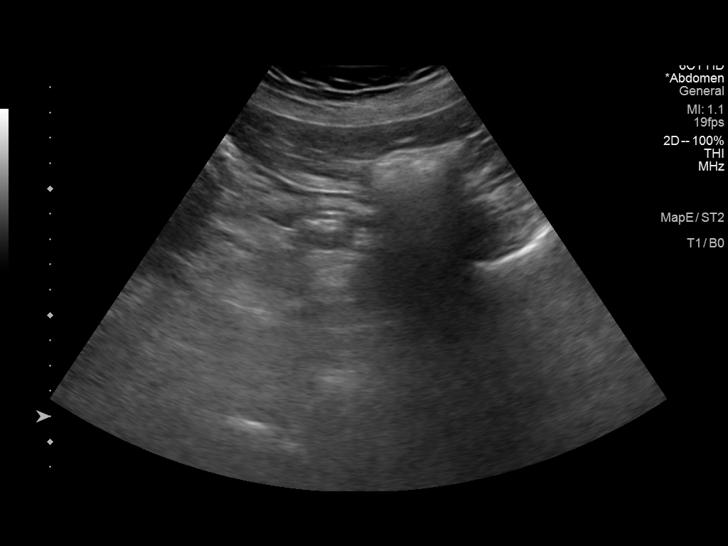
[im 37/37]
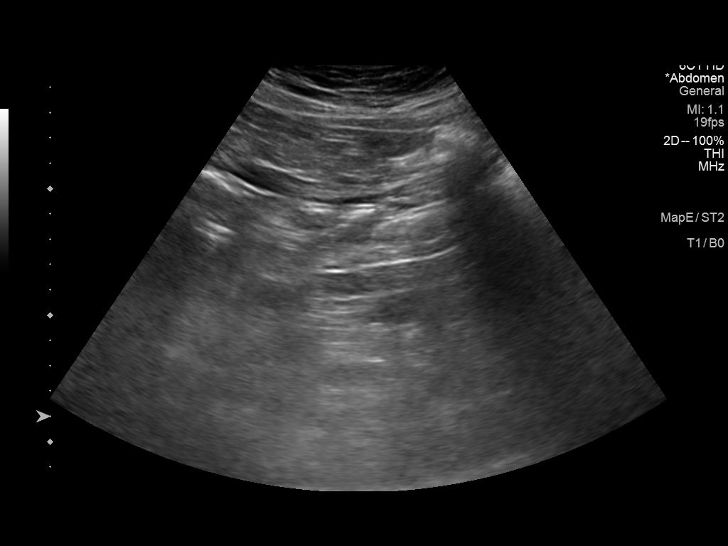

[14 of 25 positions shown; findings below may reference images not displayed]

FINDINGS: Gallbladder:

Surgically absent

Common bile duct:

Diameter: 3 mm, normal

Liver:

Echogenic parenchyma, likely fatty infiltration though this can be
seen with cirrhosis and certain infiltrative disorders. No definite
hepatic mass or nodularity identified, though assessment of
intrahepatic detail is limited by sound attenuation. Portal vein is
patent on color Doppler imaging with normal direction of blood flow
towards the liver.

Other: No free fluid.
IMPRESSION: Probable fatty infiltration of liver as above.

Post cholecystectomy.

## 2019-07-18 DIAGNOSIS — Z79890 Hormone replacement therapy (postmenopausal): Secondary | ICD-10-CM | POA: Diagnosis not present

## 2019-07-18 DIAGNOSIS — Z01419 Encounter for gynecological examination (general) (routine) without abnormal findings: Secondary | ICD-10-CM | POA: Diagnosis not present

## 2019-07-18 DIAGNOSIS — Z90710 Acquired absence of both cervix and uterus: Secondary | ICD-10-CM | POA: Diagnosis not present

## 2019-07-18 DIAGNOSIS — Z8041 Family history of malignant neoplasm of ovary: Secondary | ICD-10-CM | POA: Diagnosis not present

## 2019-07-18 DIAGNOSIS — Z803 Family history of malignant neoplasm of breast: Secondary | ICD-10-CM | POA: Diagnosis not present

## 2019-07-19 ENCOUNTER — Other Ambulatory Visit: Payer: Self-pay | Admitting: Cardiovascular Disease

## 2019-07-19 MED ORDER — ROSUVASTATIN CALCIUM 20 MG PO TABS: 20.0000 mg | ORAL_TABLET | Freq: Every day | ORAL | 3 refills | Status: DC

## 2019-08-03 DIAGNOSIS — M545 Low back pain: Secondary | ICD-10-CM | POA: Diagnosis not present

## 2019-08-07 DIAGNOSIS — R0602 Shortness of breath: Secondary | ICD-10-CM | POA: Diagnosis not present

## 2019-08-07 DIAGNOSIS — M545 Low back pain: Secondary | ICD-10-CM | POA: Diagnosis not present

## 2019-08-10 DIAGNOSIS — M545 Low back pain: Secondary | ICD-10-CM | POA: Diagnosis not present

## 2019-08-12 DIAGNOSIS — K219 Gastro-esophageal reflux disease without esophagitis: Secondary | ICD-10-CM | POA: Diagnosis not present

## 2019-08-12 DIAGNOSIS — E785 Hyperlipidemia, unspecified: Secondary | ICD-10-CM | POA: Diagnosis not present

## 2019-08-12 DIAGNOSIS — Z853 Personal history of malignant neoplasm of breast: Secondary | ICD-10-CM | POA: Diagnosis not present

## 2019-08-12 DIAGNOSIS — S0990XA Unspecified injury of head, initial encounter: Secondary | ICD-10-CM | POA: Diagnosis not present

## 2019-08-12 DIAGNOSIS — G4733 Obstructive sleep apnea (adult) (pediatric): Secondary | ICD-10-CM | POA: Diagnosis not present

## 2019-08-12 DIAGNOSIS — S20219A Contusion of unspecified front wall of thorax, initial encounter: Secondary | ICD-10-CM | POA: Diagnosis not present

## 2019-08-12 DIAGNOSIS — I252 Old myocardial infarction: Secondary | ICD-10-CM | POA: Diagnosis not present

## 2019-08-12 DIAGNOSIS — I10 Essential (primary) hypertension: Secondary | ICD-10-CM | POA: Diagnosis not present

## 2019-08-12 DIAGNOSIS — Z7902 Long term (current) use of antithrombotics/antiplatelets: Secondary | ICD-10-CM | POA: Diagnosis not present

## 2019-08-12 DIAGNOSIS — Z87891 Personal history of nicotine dependence: Secondary | ICD-10-CM | POA: Diagnosis not present

## 2019-08-12 DIAGNOSIS — Z7982 Long term (current) use of aspirin: Secondary | ICD-10-CM | POA: Diagnosis not present

## 2019-08-12 DIAGNOSIS — M109 Gout, unspecified: Secondary | ICD-10-CM | POA: Diagnosis not present

## 2019-08-12 DIAGNOSIS — I2510 Atherosclerotic heart disease of native coronary artery without angina pectoris: Secondary | ICD-10-CM | POA: Diagnosis not present

## 2019-08-12 DIAGNOSIS — Z79899 Other long term (current) drug therapy: Secondary | ICD-10-CM | POA: Diagnosis not present

## 2019-08-20 DIAGNOSIS — R1310 Dysphagia, unspecified: Secondary | ICD-10-CM | POA: Diagnosis not present

## 2019-08-20 DIAGNOSIS — K228 Other specified diseases of esophagus: Secondary | ICD-10-CM | POA: Diagnosis not present

## 2019-08-21 ENCOUNTER — Telehealth: Payer: Self-pay | Admitting: Cardiovascular Disease

## 2019-08-21 DIAGNOSIS — K222 Esophageal obstruction: Secondary | ICD-10-CM | POA: Diagnosis not present

## 2019-08-21 DIAGNOSIS — R140 Abdominal distension (gaseous): Secondary | ICD-10-CM | POA: Diagnosis not present

## 2019-08-21 DIAGNOSIS — I219 Acute myocardial infarction, unspecified: Secondary | ICD-10-CM | POA: Diagnosis not present

## 2019-08-21 DIAGNOSIS — K219 Gastro-esophageal reflux disease without esophagitis: Secondary | ICD-10-CM | POA: Diagnosis not present

## 2019-08-21 DIAGNOSIS — I10 Essential (primary) hypertension: Secondary | ICD-10-CM | POA: Diagnosis not present

## 2019-08-21 DIAGNOSIS — R0602 Shortness of breath: Secondary | ICD-10-CM | POA: Diagnosis not present

## 2019-08-21 NOTE — Telephone Encounter (Signed)
Patient calling to confirm that we got the fax from Dr. Annitta Jersey from [facility]. She is having SOB.

## 2019-08-23 DIAGNOSIS — Z1152 Encounter for screening for COVID-19: Secondary | ICD-10-CM | POA: Diagnosis not present

## 2019-08-23 DIAGNOSIS — Z01812 Encounter for preprocedural laboratory examination: Secondary | ICD-10-CM | POA: Diagnosis not present

## 2019-08-24 DIAGNOSIS — R1310 Dysphagia, unspecified: Secondary | ICD-10-CM | POA: Diagnosis not present

## 2019-08-24 DIAGNOSIS — K449 Diaphragmatic hernia without obstruction or gangrene: Secondary | ICD-10-CM | POA: Diagnosis not present

## 2019-08-24 DIAGNOSIS — Z955 Presence of coronary angioplasty implant and graft: Secondary | ICD-10-CM | POA: Diagnosis not present

## 2019-08-24 DIAGNOSIS — Z87891 Personal history of nicotine dependence: Secondary | ICD-10-CM | POA: Diagnosis not present

## 2019-08-24 DIAGNOSIS — C50919 Malignant neoplasm of unspecified site of unspecified female breast: Secondary | ICD-10-CM | POA: Diagnosis not present

## 2019-08-24 DIAGNOSIS — K222 Esophageal obstruction: Secondary | ICD-10-CM | POA: Diagnosis not present

## 2019-08-24 DIAGNOSIS — I252 Old myocardial infarction: Secondary | ICD-10-CM | POA: Diagnosis not present

## 2019-09-04 ENCOUNTER — Other Ambulatory Visit: Payer: Self-pay | Admitting: Obstetrics & Gynecology

## 2019-09-04 DIAGNOSIS — N952 Postmenopausal atrophic vaginitis: Secondary | ICD-10-CM

## 2019-09-04 NOTE — Telephone Encounter (Signed)
August at [facility] in [place] calling regarding Compounded Vitamin E prescription. States he was trying to have it transferred from [place] to [facility], but there are no refills left.

## 2019-09-05 NOTE — Telephone Encounter (Signed)
Pt requesting refills on Vit E Cream to be sent to new pharmacy in [place], [place].  Rx pended if approved. # 36, 1 RF   Last AEX none, Last OV 05/15/19 for vaginal atrophy.

## 2019-09-06 NOTE — Telephone Encounter (Signed)
Patient is aware the rx was sent to provider for review.

## 2019-09-06 NOTE — Telephone Encounter (Signed)
Patient calling to check status of refill request.

## 2019-09-11 MED ORDER — NONFORMULARY OR COMPOUNDED ITEM: 1 refills | Status: DC

## 2019-09-11 NOTE — Telephone Encounter (Signed)
Prescription faxed to pharmacy.

## 2019-10-03 DIAGNOSIS — R0602 Shortness of breath: Secondary | ICD-10-CM | POA: Diagnosis not present

## 2019-10-03 DIAGNOSIS — R82998 Other abnormal findings in urine: Secondary | ICD-10-CM | POA: Diagnosis not present

## 2019-10-03 DIAGNOSIS — K219 Gastro-esophageal reflux disease without esophagitis: Secondary | ICD-10-CM | POA: Diagnosis not present

## 2019-10-03 DIAGNOSIS — N182 Chronic kidney disease, stage 2 (mild): Secondary | ICD-10-CM | POA: Diagnosis not present

## 2019-10-03 DIAGNOSIS — R140 Abdominal distension (gaseous): Secondary | ICD-10-CM | POA: Diagnosis not present

## 2019-10-03 DIAGNOSIS — I10 Essential (primary) hypertension: Secondary | ICD-10-CM | POA: Diagnosis not present

## 2019-10-06 DIAGNOSIS — L710 Perioral dermatitis: Secondary | ICD-10-CM | POA: Diagnosis not present

## 2019-10-09 DIAGNOSIS — L218 Other seborrheic dermatitis: Secondary | ICD-10-CM | POA: Diagnosis not present

## 2019-10-09 DIAGNOSIS — L570 Actinic keratosis: Secondary | ICD-10-CM | POA: Diagnosis not present

## 2019-10-09 DIAGNOSIS — L578 Other skin changes due to chronic exposure to nonionizing radiation: Secondary | ICD-10-CM | POA: Diagnosis not present

## 2019-10-09 DIAGNOSIS — L718 Other rosacea: Secondary | ICD-10-CM | POA: Diagnosis not present

## 2019-10-09 DIAGNOSIS — D692 Other nonthrombocytopenic purpura: Secondary | ICD-10-CM | POA: Diagnosis not present

## 2019-10-09 DIAGNOSIS — D225 Melanocytic nevi of trunk: Secondary | ICD-10-CM | POA: Diagnosis not present

## 2019-10-10 DIAGNOSIS — R140 Abdominal distension (gaseous): Secondary | ICD-10-CM | POA: Diagnosis not present

## 2019-10-17 ENCOUNTER — Other Ambulatory Visit: Payer: Self-pay | Admitting: Oncology

## 2019-10-18 ENCOUNTER — Telehealth: Payer: Self-pay | Admitting: Cardiovascular Disease

## 2019-10-18 DIAGNOSIS — M1712 Unilateral primary osteoarthritis, left knee: Secondary | ICD-10-CM | POA: Diagnosis not present

## 2019-10-18 DIAGNOSIS — Z96651 Presence of right artificial knee joint: Secondary | ICD-10-CM | POA: Diagnosis not present

## 2019-10-18 DIAGNOSIS — M1711 Unilateral primary osteoarthritis, right knee: Secondary | ICD-10-CM | POA: Diagnosis not present

## 2019-10-18 NOTE — Telephone Encounter (Signed)
Patient calling stating she is seeing a Psychiatric nurse, because she  had an accident and needs to fix her eye lid. She says she needs to know if she needs to hold her plavix and for how long. I informed the patient to have the plastic surgeon's office call for the clearance and she agreed, but still requested me to send a message to Dr. Antionette Char nurse.

## 2019-10-18 NOTE — Telephone Encounter (Signed)
I will send message to Dr. Antionette Char nurse as pt has instructed.

## 2019-10-20 ENCOUNTER — Telehealth: Payer: Self-pay | Admitting: Cardiovascular Disease

## 2019-10-20 DIAGNOSIS — R944 Abnormal results of kidney function studies: Secondary | ICD-10-CM | POA: Diagnosis not present

## 2019-10-20 NOTE — Telephone Encounter (Signed)
Patient wanted to make Dr. Burt Knack and his nurse aware that the surgery date has been set for 10-31-19, and someone from the surgeon's office will be calling either today or Monday to get clearance

## 2019-10-20 NOTE — Telephone Encounter (Signed)
   [facility] Pre-operative Risk Assessment    Request for surgical clearance:  1. What type of surgery is being performed? Blepharoplasty of both eyes   2. When is this surgery scheduled? 10-31-19  3. What type of clearance is required (medical clearance vs. Pharmacy clearance to hold med vs. Both)? Pharmacy  4. Are there any medications that need to be held prior to surgery and how long?Plavix and Aspirin for 2 weeks  5. Practice name and name of physician performing surgery?  Dr. Duncan Dull, [facility]  6. What is your office phone number: [phone]   7.   What is your office fax number: [phone]  8.   Anesthesia type (None, local, MAC, general) ? Local     Tracey [name] 10/20/2019, 4:41 PM  _________________________________________________________________   (provider comments below)

## 2019-10-20 NOTE — Telephone Encounter (Signed)
Informed patient that once clearance is received it will be reviewed and sent back.

## 2019-10-23 DIAGNOSIS — L218 Other seborrheic dermatitis: Secondary | ICD-10-CM | POA: Diagnosis not present

## 2019-10-23 NOTE — Telephone Encounter (Signed)
OK to hold ASA and plavix x 7 days before surgery. Resume when at acceptably low bleeding risk post-operatively. thanks

## 2019-10-23 NOTE — Telephone Encounter (Signed)
   Primary Cardiologist: Sherren Mocha, MD  Chart reviewed as part of pre-operative protocol coverage.   Hx of CAD s/p PCI of the LAD with overlapping drug-eluting stents 08/2017 @ [place]. Patient had normal Stress test 05/2019.  Dr. Burt Knack, Please give your recommendations regarding DAPT and how long its need to be hold?  Please forward your response to [facility] PREOP.   Thank you  Leanor Kail, PA 10/23/2019, 9:02 AM

## 2019-11-10 ENCOUNTER — Other Ambulatory Visit: Payer: Self-pay | Admitting: Cardiovascular Disease

## 2019-12-11 ENCOUNTER — Other Ambulatory Visit: Payer: Self-pay | Admitting: Cardiovascular Disease

## 2020-01-18 ENCOUNTER — Other Ambulatory Visit: Payer: Self-pay | Admitting: Cardiovascular Disease

## 2020-01-24 DIAGNOSIS — Z471 Aftercare following joint replacement surgery: Secondary | ICD-10-CM | POA: Diagnosis not present

## 2020-01-24 DIAGNOSIS — M25562 Pain in left knee: Secondary | ICD-10-CM | POA: Diagnosis not present

## 2020-01-24 DIAGNOSIS — Z96651 Presence of right artificial knee joint: Secondary | ICD-10-CM | POA: Diagnosis not present

## 2020-01-24 DIAGNOSIS — M1712 Unilateral primary osteoarthritis, left knee: Secondary | ICD-10-CM | POA: Diagnosis not present

## 2020-02-21 DIAGNOSIS — E538 Deficiency of other specified B group vitamins: Secondary | ICD-10-CM | POA: Diagnosis not present

## 2020-02-21 DIAGNOSIS — E7849 Other hyperlipidemia: Secondary | ICD-10-CM | POA: Diagnosis not present

## 2020-02-21 DIAGNOSIS — M109 Gout, unspecified: Secondary | ICD-10-CM | POA: Diagnosis not present

## 2020-02-28 DIAGNOSIS — G43109 Migraine with aura, not intractable, without status migrainosus: Secondary | ICD-10-CM | POA: Diagnosis not present

## 2020-02-28 DIAGNOSIS — J069 Acute upper respiratory infection, unspecified: Secondary | ICD-10-CM | POA: Diagnosis not present

## 2020-02-28 DIAGNOSIS — E785 Hyperlipidemia, unspecified: Secondary | ICD-10-CM | POA: Diagnosis not present

## 2020-02-28 DIAGNOSIS — R05 Cough: Secondary | ICD-10-CM | POA: Diagnosis not present

## 2020-02-28 DIAGNOSIS — I130 Hypertensive heart and chronic kidney disease with heart failure and stage 1 through stage 4 chronic kidney disease, or unspecified chronic kidney disease: Secondary | ICD-10-CM | POA: Diagnosis not present

## 2020-02-28 DIAGNOSIS — G4733 Obstructive sleep apnea (adult) (pediatric): Secondary | ICD-10-CM | POA: Diagnosis not present

## 2020-02-28 DIAGNOSIS — F418 Other specified anxiety disorders: Secondary | ICD-10-CM | POA: Diagnosis not present

## 2020-02-28 DIAGNOSIS — E538 Deficiency of other specified B group vitamins: Secondary | ICD-10-CM | POA: Diagnosis not present

## 2020-02-28 DIAGNOSIS — N1832 Chronic kidney disease, stage 3b: Secondary | ICD-10-CM | POA: Diagnosis not present

## 2020-02-28 DIAGNOSIS — M109 Gout, unspecified: Secondary | ICD-10-CM | POA: Diagnosis not present

## 2020-02-28 DIAGNOSIS — Z0000 Encounter for general adult medical examination without abnormal findings: Secondary | ICD-10-CM | POA: Diagnosis not present

## 2020-02-28 DIAGNOSIS — R82998 Other abnormal findings in urine: Secondary | ICD-10-CM | POA: Diagnosis not present

## 2020-02-28 DIAGNOSIS — K921 Melena: Secondary | ICD-10-CM | POA: Diagnosis not present

## 2020-02-28 DIAGNOSIS — I2510 Atherosclerotic heart disease of native coronary artery without angina pectoris: Secondary | ICD-10-CM | POA: Diagnosis not present

## 2020-03-04 DIAGNOSIS — K219 Gastro-esophageal reflux disease without esophagitis: Secondary | ICD-10-CM | POA: Diagnosis not present

## 2020-03-04 DIAGNOSIS — R195 Other fecal abnormalities: Secondary | ICD-10-CM | POA: Diagnosis not present

## 2020-03-04 DIAGNOSIS — Z86010 Personal history of colonic polyps: Secondary | ICD-10-CM | POA: Diagnosis not present

## 2020-03-11 ENCOUNTER — Telehealth: Payer: Self-pay | Admitting: *Deleted

## 2020-03-11 NOTE — Telephone Encounter (Signed)
° °  [facility] Pre-operative Risk Assessment    HEARTCARE STAFF: - Please ensure there is not already an duplicate clearance open for this procedure. - Under Visit Info/Reason for Call, type in Other and utilize the format Clearance MM/DD/YY or Clearance TBD. Do not use dashes or single digits. - If request is for dental extraction, please clarify the # of teeth to be extracted.  Request for surgical clearance:  1. What type of surgery is being performed? COLONOSCOPY   2. When is this surgery scheduled? 03/21/20   3. What type of clearance is required (medical clearance vs. Pharmacy clearance to hold med vs. Both)? MEDICAL  4. Are there any medications that need to be held prior to surgery and how long? PLAVIX   5. Practice name and name of physician performing surgery? [facility]; DR. MANN   6. What is the office phone number? [phone]   7.   What is the office fax number? [phone]  8.   Anesthesia type (None, local, MAC, general) ? PROPOFOL   Julaine Hua 03/11/2020, 8:19 AM  _________________________________________________________________   (provider comments below)

## 2020-03-11 NOTE — Telephone Encounter (Signed)
Patient was cleared in March for a different procedure at which she had to hold the aspirin and plavix. For this cardiac clearance, only the plavix needs to be held.   I called the patient and left a voicemail for her to call back and speak to the on call preop APP so we can clear her over the phone

## 2020-03-12 ENCOUNTER — Other Ambulatory Visit: Payer: Self-pay | Admitting: Cardiovascular Disease

## 2020-03-13 ENCOUNTER — Other Ambulatory Visit: Payer: Self-pay | Admitting: *Deleted

## 2020-03-13 MED ORDER — CLOPIDOGREL BISULFATE 75 MG PO TABS: 75.0000 mg | ORAL_TABLET | Freq: Every day | ORAL | 3 refills | Status: DC

## 2020-03-13 NOTE — Telephone Encounter (Signed)
Follow Up:      Pt is returning call from yesterday. 

## 2020-03-13 NOTE — Telephone Encounter (Signed)
   Primary Cardiologist: Sherren Mocha, MD  Chart reviewed and patient contacted by phone today as part of pre-operative protocol coverage. Given past medical history and time since last visit, based on ACC/AHA guidelines, Tracey [name] would be at acceptable risk for the planned procedure without further cardiovascular testing.   Ok to hold Plavix 5-7 days pre op.  We would prefer the patient remain on aspirin if possible but will defer to the operating surgeon.   I will route this recommendation to the requesting party via Epic fax function and remove from pre-op pool.  Please call with questions.  Kerin Ransom, PA-C 03/13/2020, 10:05 AM

## 2020-03-21 DIAGNOSIS — R195 Other fecal abnormalities: Secondary | ICD-10-CM | POA: Diagnosis not present

## 2020-03-27 ENCOUNTER — Other Ambulatory Visit: Payer: Self-pay

## 2020-03-27 DIAGNOSIS — C50411 Malignant neoplasm of upper-outer quadrant of right female breast: Secondary | ICD-10-CM

## 2020-03-28 ENCOUNTER — Inpatient Hospital Stay: Payer: Medicare Other | Attending: Adult Health | Admitting: Adult Health

## 2020-03-28 ENCOUNTER — Other Ambulatory Visit: Payer: Self-pay

## 2020-03-28 ENCOUNTER — Inpatient Hospital Stay: Payer: Medicare Other

## 2020-03-28 VITALS — BP 147/77 | HR 73 | Temp 98.9°F | Resp 18 | Ht 62.0 in | Wt 135.3 lb

## 2020-03-28 DIAGNOSIS — Z9049 Acquired absence of other specified parts of digestive tract: Secondary | ICD-10-CM | POA: Insufficient documentation

## 2020-03-28 DIAGNOSIS — Z79899 Other long term (current) drug therapy: Secondary | ICD-10-CM | POA: Diagnosis not present

## 2020-03-28 DIAGNOSIS — Z87891 Personal history of nicotine dependence: Secondary | ICD-10-CM | POA: Insufficient documentation

## 2020-03-28 DIAGNOSIS — M1990 Unspecified osteoarthritis, unspecified site: Secondary | ICD-10-CM | POA: Diagnosis not present

## 2020-03-28 DIAGNOSIS — C50411 Malignant neoplasm of upper-outer quadrant of right female breast: Secondary | ICD-10-CM

## 2020-03-28 DIAGNOSIS — Z170 Estrogen receptor positive status [ER+]: Secondary | ICD-10-CM | POA: Insufficient documentation

## 2020-03-28 DIAGNOSIS — Z90722 Acquired absence of ovaries, bilateral: Secondary | ICD-10-CM | POA: Diagnosis not present

## 2020-03-28 DIAGNOSIS — Z9013 Acquired absence of bilateral breasts and nipples: Secondary | ICD-10-CM | POA: Insufficient documentation

## 2020-03-28 DIAGNOSIS — Z8719 Personal history of other diseases of the digestive system: Secondary | ICD-10-CM | POA: Insufficient documentation

## 2020-03-28 DIAGNOSIS — Z923 Personal history of irradiation: Secondary | ICD-10-CM | POA: Diagnosis not present

## 2020-03-28 DIAGNOSIS — Z7289 Other problems related to lifestyle: Secondary | ICD-10-CM | POA: Insufficient documentation

## 2020-03-28 DIAGNOSIS — I252 Old myocardial infarction: Secondary | ICD-10-CM | POA: Diagnosis not present

## 2020-03-28 DIAGNOSIS — Z803 Family history of malignant neoplasm of breast: Secondary | ICD-10-CM | POA: Insufficient documentation

## 2020-03-28 DIAGNOSIS — Z8249 Family history of ischemic heart disease and other diseases of the circulatory system: Secondary | ICD-10-CM | POA: Diagnosis not present

## 2020-03-28 DIAGNOSIS — Z882 Allergy status to sulfonamides status: Secondary | ICD-10-CM | POA: Insufficient documentation

## 2020-03-28 DIAGNOSIS — Z885 Allergy status to narcotic agent status: Secondary | ICD-10-CM | POA: Diagnosis not present

## 2020-03-28 DIAGNOSIS — Z888 Allergy status to other drugs, medicaments and biological substances status: Secondary | ICD-10-CM | POA: Diagnosis not present

## 2020-03-28 LAB — CMP (CANCER CENTER ONLY)
ALT: 24 U/L (ref 0–44)
AST: 26 U/L (ref 15–41)
Albumin: 4.3 g/dL (ref 3.5–5.0)
Alkaline Phosphatase: 96 U/L (ref 38–126)
Anion gap: 14 (ref 5–15)
BUN: 31 mg/dL — ABNORMAL HIGH (ref 8–23)
CO2: 21 mmol/L — ABNORMAL LOW (ref 22–32)
Calcium: 10.4 mg/dL — ABNORMAL HIGH (ref 8.9–10.3)
Chloride: 104 mmol/L (ref 98–111)
Creatinine: 1.34 mg/dL — ABNORMAL HIGH (ref 0.44–1.00)
GFR, Est AFR Am: 45 mL/min — ABNORMAL LOW (ref >60)
GFR, Estimated: 39 mL/min — ABNORMAL LOW (ref >60)
Glucose, Bld: 55 mg/dL — ABNORMAL LOW (ref 70–99)
Potassium: 4.2 mmol/L (ref 3.5–5.1)
Sodium: 139 mmol/L (ref 135–145)
Total Bilirubin: 0.4 mg/dL (ref 0.3–1.2)
Total Protein: 7.7 g/dL (ref 6.5–8.1)

## 2020-03-28 LAB — CBC WITH DIFFERENTIAL (CANCER CENTER ONLY)
Abs Immature Granulocytes: 0.01 10*3/uL (ref 0.00–0.07)
Basophils Absolute: 0.1 10*3/uL (ref 0.0–0.1)
Basophils Relative: 1 %
Eosinophils Absolute: 0.1 10*3/uL (ref 0.0–0.5)
Eosinophils Relative: 2 %
HCT: 43.2 % (ref 36.0–46.0)
Hemoglobin: 14 g/dL (ref 12.0–15.0)
Immature Granulocytes: 0 %
Lymphocytes Relative: 21 %
Lymphs Abs: 1.5 10*3/uL (ref 0.7–4.0)
MCH: 30.8 pg (ref 26.0–34.0)
MCHC: 32.4 g/dL (ref 30.0–36.0)
MCV: 95.2 fL (ref 80.0–100.0)
Monocytes Absolute: 0.6 10*3/uL (ref 0.1–1.0)
Monocytes Relative: 9 %
Neutro Abs: 4.8 10*3/uL (ref 1.7–7.7)
Neutrophils Relative %: 67 %
Platelet Count: 207 10*3/uL (ref 150–400)
RBC: 4.54 MIL/uL (ref 3.87–5.11)
RDW: 14.1 % (ref 11.5–15.5)
WBC Count: 7.1 10*3/uL (ref 4.0–10.5)
nRBC: 0 % (ref 0.0–0.2)

## 2020-03-28 NOTE — Progress Notes (Signed)
CLINIC:  Survivorship   REASON FOR VISIT:  Routine follow-up for history of breast cancer.   BRIEF ONCOLOGIC HISTORY:   (1)  status post right breast upper outer quadrant biopsy 03/24/2013 for a pT1b cN0, stage IA invasive ductal carcinoma, grade 1, strongly estrogen and progesterone receptor positive, HER-2 negative  (2) status post bilateral mastectomies 05/23/2013 showing:             (a) on the right, a pT1b pN0, stage invasive ductal carcinoma, grade 1, again HER-2 negative             (b) on the left, no evidence of malignancy  (3) Oncotype DX score of 20 predicted a rate of distant recurrence within 10 years of 13% if the patient's only systemic treatment was tamoxifen for 5 years.  (4) a positive margin required adjuvant radiation, completed January 2015 in [place]  (5) status post bilateral implant placement January 2015             (a) with impending right implant rupture February 2016 underwent bilateral DIEP reconstruction under Dr. Kandyce Rud in [place] (fax [phone])  (6)  started on anastrozole, mid September 2014, prior to definitive surgery; continued postop; bone density 07/31/2013 was normal             (a) repeat bone density 02/03/2016 showed a T score of -0.1 (normal             (b) switched to tamoxifen November 2018 because of arthralgias secondary to the anastrozole             (c) switched to exemestane as of 12/13/2017             (d) completing five years of anti-estrogens September 2019   INTERVAL HISTORY:  Tracey [name] presents to the [facility] today for routine follow-up for her history of breast cancer.  Overall, she reports feeling quite well.  She tells me that she lives partly here in [place] and partly in [place].  She has one daughter with grandchildren here in [place], and one in [place].  She enjoys being able to do 6 months at each place.  She notes that she exercises regularly, and is up to date with seeing her PCP and other cancer  screenings.      REVIEW OF SYSTEMS:  Review of Systems  Constitutional: Negative for appetite change, chills, fatigue, fever and unexpected weight change.  HENT:   Negative for hearing loss and lump/mass.   Eyes: Negative for eye problems and icterus.  Respiratory: Negative for chest tightness, cough and shortness of breath.   Cardiovascular: Negative for chest pain, leg swelling and palpitations.  Gastrointestinal: Negative for abdominal distention, abdominal pain, constipation, diarrhea, nausea and vomiting.  Endocrine: Negative for hot flashes.  Genitourinary: Negative for difficulty urinating.   Musculoskeletal: Negative for arthralgias.  Skin: Negative for itching and rash.  Neurological: Negative for dizziness, extremity weakness, headaches and numbness.  Hematological: Negative for adenopathy. Does not bruise/bleed easily.  Psychiatric/Behavioral: Negative for depression. The patient is not nervous/anxious.    Breast: Denies any new nodularity, masses, tenderness, nipple changes, or nipple discharge.       PAST MEDICAL/SURGICAL HISTORY:  Past Medical History:  Diagnosis Date  . Allergy   . Anxiety    takes Ativan daily prn  . Arthritis   . Breast cancer ([place]) 2014   ER+/PR+/HEr2-,   . Bruises easily   . Colon polyps    2 polyps by report  .  Diverticulosis   . GERD (gastroesophageal reflux disease)    occasionally takes Nexium   . Gout    takes Allopurinol daily and Colchicine daily prn;last attack 5yr ago  . Gout   . History of bladder infections    many yrs ago  . History of bronchitis    last time at least 821yrago  . History of stress incontinence   . Hyperlipidemia    takes Pravastatin daily  . Insomnia    takes Ambien nightly prn  . [place] ([place]) [place]  . OSA (obstructive sleep apnea)    on CPAP  . Osteoarthritis   . Peripheral edema    takes Furosemide daily prn  . PONV (postoperative nausea and  vomiting)   . Postmenopausal hormone therapy   . Radiation 07/27/13-09/07/13   Right Breast x 31 treatments  . Rhinitis    uses Flonase prn  . Sinus congestion   . Status post breast reconstruction 10/15/14   [place]  . Tachycardia    takes Metoprolol daily  . Ventricular tachycardia, non-sustained ([place]   during sleep study 2009 with normal cardiac workup   Past Surgical History:  Procedure Laterality Date  . ABDOMINAL HYSTERECTOMY     with BSO  . APPENDECTOMY    . AXILLARY SENTINEL NODE BIOPSY Right 05/23/2013   Procedure: AXILLARY SENTINEL NODE BIOPSY;  Surgeon: ToOdis HollingsheadMD;  [place] [facility];  Laterality: Right;  nuc med injection 7:00  . BREAST RECONSTRUCTION WITH PLACEMENT OF TISSUE EXPANDER AND FLEX HD (ACELLULAR HYDRATED DERMIS) Bilateral 05/23/2013   Procedure: BILATERAL BREAST RECONSTRUCTION WITH PLACEMENT OF TISSUE EXPANDER AND FLEX HD;  Surgeon: DaCrissie ReeseMD;  [facility]astics;  Laterality: Bilateral;  . CARDIAC CATHETERIZATION  1999  . CATARACT EXTRACTION, BILATERAL Bilateral   . CHOLECYSTECTOMY    . COSMETIC SURGERY    . OTHER SURGICAL HISTORY  09/2017   had cyst removal from spine   . PERCUTANEOUS CORONARY STENT INTERVENTION (PCI-S)  [place]   [place]  . REMOVAL OF BILATERAL TISSUE EXPANDERS WITH PLACEMENT OF BILATERAL BREAST IMPLANTS Bilateral 05/17/2014   Procedure: REMOVAL OF BILATERAL TISSUE EXPANDERS WITH PLACEMENT OF BILATERAL BREAST IMPLANTS FOR RECONSTRUCTION;  Surgeon: DaCrissie ReeseMD;  [place]: [facility];  Laterality: Bilateral;  . TONSILLECTOMY    . TOTAL KNEE ARTHROPLASTY Right 07/26/2018   Procedure: TOTAL KNEE ARTHROPLASTY;  Surgeon: OlParalee CancelMD;  Location: [facility];  Service: Orthopedics;  Laterality: Right;  70 mins  . TOTAL MASTECTOMY Bilateral 05/23/2013   Procedure: TOTAL MASTECTOMY;  Surgeon: ToOdis HollingsheadMD;   [place] [facility]: General;  Laterality: Bilateral;     ALLERGIES:  Allergies  Allergen Reactions  . Pseudoeph-Hydrocodone-Gg Other (See Comments)  . Ivp Dye [Iodinated Diagnostic Agents] Hives  . Sulfa Antibiotics Swelling  . Tape Other (See Comments)    Unknown; has paper thin skin ; ok to use paper tape      CURRENT MEDICATIONS:  Outpatient Encounter Medications as of 03/28/2020  Medication Sig  . allopurinol (ZYLOPRIM) 100 MG tablet TAKE 1 TABLET BY MOUTH ONCE DAILY  . apraclonidine (IOPIDINE) 0.5 % ophthalmic solution Place 1 drop into the right eye daily as needed (for droopy eye).   . ASPIRIN LOW DOSE 81 MG chewable tablet Chew 81 mg by mouth daily.   . celecoxib (CELEBREX) 200  MG capsule celecoxib 200 mg capsule  TAKE 1 CAPSULE BY MOUTH EVERY DAY  . Cholecalciferol (VITAMIN D3) 50 MCG (2000 UT) TABS Take 2,000 Units by mouth daily.  . clopidogrel (PLAVIX) 75 MG tablet Take 1 tablet (75 mg total) by mouth daily.  . colchicine 0.6 MG tablet TAKE 1 TABLET BY MOUTH EVERY DAY OR AS NEEDED  . cyanocobalamin (,VITAMIN B-12,) 1000 MCG/ML injection Inject 1,000 mcg into the muscle every 30 (thirty) days.  Marland Kitchen docusate sodium (COLACE) 100 MG capsule Take 1 capsule (100 mg total) by mouth 2 (two) times daily.  Marland Kitchen ezetimibe (ZETIA) 10 MG tablet TAKE 1 TABLET(10 MG) BY MOUTH DAILY  . fluticasone (FLONASE) 50 MCG/ACT nasal spray Place 1 spray into both nostrils daily.   . furosemide (LASIX) 40 MG tablet Take 40 mg by mouth daily as needed for fluid.   Marland Kitchen lisinopril (ZESTRIL) 10 MG tablet TAKE 1 TABLET(10 MG) BY MOUTH DAILY  . metoprolol succinate (TOPROL-XL) 25 MG 24 hr tablet TAKE 1 TABLET(25 MG) BY MOUTH DAILY  . Multiple Vitamin (MULTI-VITAMIN) tablet Take by mouth.  . nitroGLYCERIN (NITROSTAT) 0.4 MG SL tablet PLACE 1 TABLET UNDER THE TONGUE EVERY 5 MINUTES AS NEEDED FOR CHEST PAIN WITH A MAX OF 3 DOSES  . NONFORMULARY OR COMPOUNDED ITEM Vitamin E vaginal cream 200u/ml.  One ml pv  three times weekly.  . pantoprazole (PROTONIX) 40 MG tablet Take 1 tablet (40 mg total) by mouth daily.  . rosuvastatin (CRESTOR) 20 MG tablet Take 1 tablet (20 mg total) by mouth daily.  Marland Kitchen testosterone cypionate (DEPOTESTOSTERONE CYPIONATE) 200 MG/ML injection INJECT 1 ML INTO THE MUSCLE EVERY 28 DAYS. PATIENT IS DUE FOR APPOINTMENT  . venlafaxine XR (EFFEXOR-XR) 150 MG 24 hr capsule TAKE 1 CAPSULE BY MOUTH DAILY WITH BREAKFAST  . [DISCONTINUED] PROAIR HFA 108 (90 Base) MCG/ACT inhaler INHALE 1 TO 2 PUFFS BY MOUTH EVERY 6 HOURS AS NEEDED FOR 30 DAYS.   No facility-administered encounter medications on file as of 03/28/2020.     ONCOLOGIC FAMILY HISTORY:  Family History  Problem Relation Age of Onset  . Breast cancer Mother        possible inflammatory breast cancer  . Heart attack Father   . Heart disease Father   . ALS Sister   . Breast cancer Maternal Grandmother 33  . Heart attack Maternal Grandfather   . Heart attack Paternal Grandfather   . Breast cancer Maternal Aunt        dx in her 50s  . Breast cancer Other        maternal great grandmother; dx in her 22s    GENETIC COUNSELING/TESTING: See above--has tested negative  SOCIAL HISTORY:  Social History   Socioeconomic History  . Marital status: Married    Spouse name: Not on file  . Number of children: 2  . Years of education: Not on file  . Highest education level: Not on file  Occupational History  . Not on file  Tobacco Use  . Smoking status: Former Smoker    Packs/day: 1.00    Years: 20.00    Pack years: 20.00    Start date: 06/09/1992  . Smokeless tobacco: Never Used  . Tobacco comment: quit at age 30  Vaping Use  . Vaping Use: Never used  Substance and Sexual Activity  . Alcohol use: Yes    Alcohol/week: 7.0 - 14.0 standard drinks    Types: 7 - 14 Glasses of wine per week  . Drug  use: No    Comment: quit 24 years ago  . Sexual activity: Yes    Birth control/protection: Surgical  Other Topics  Concern  . Not on file  Social History Narrative  . Not on file   Social Determinants of Health   Financial Resource Strain:   . Difficulty of Paying Living Expenses:   Food Insecurity:   . Worried About Charity fundraiser in the Last Year:   . Arboriculturist in the Last Year:   Transportation Needs:   . Film/video editor (Medical):   Marland Kitchen Lack of Transportation (Non-Medical):   Physical Activity:   . Days of Exercise per Week:   . Minutes of Exercise per Session:   Stress:   . Feeling of Stress :   Social Connections:   . Frequency of Communication with Friends and Family:   . Frequency of Social Gatherings with Friends and Family:   . Attends Religious Services:   . Active Member of Clubs or Organizations:   . Attends Archivist Meetings:   Marland Kitchen Marital Status:   Intimate Partner Violence:   . Fear of Current or Ex-Partner:   . Emotionally Abused:   Marland Kitchen Physically Abused:   . Sexually Abused:       PHYSICAL EXAMINATION:  Vital Signs: Vitals:   03/28/20 1507  BP: (!) 147/77  Pulse: 73  Resp: 18  Temp: 98.9 F (37.2 C)  SpO2: 100%   Filed Weights   03/28/20 1507  Weight: 135 lb 4.8 oz (61.4 kg)   General: Well-nourished, well-appearing female in no acute distress.  Unaccompanied today.   HEENT: Head is normocephalic.  Pupils equal and reactive to light. Conjunctivae clear without exudate.  Sclerae anicteric. Oral mucosa is pink, moist.  Oropharynx is pink without lesions or erythema.  Lymph: No cervical, supraclavicular, or infraclavicular lymphadenopathy noted on palpation.  Cardiovascular: Regular rate and rhythm.Marland Kitchen Respiratory: Clear to auscultation bilaterally. Chest expansion symmetric; breathing non-labored.  Breast Exam:  Breasts are s/p mastectomy and reconstruction, no sign of local recurrence -Axilla: No axillary adenopathy bilaterally.  GI: Abdomen soft and round; non-tender, non-distended. Bowel sounds normoactive. No hepatosplenomegaly.    GU: Deferred.  Neuro: No focal deficits. Steady gait.  Psych: Mood and affect normal and appropriate for situation.  MSK: No focal spinal tenderness to palpation, full range of motion in bilateral upper extremities Extremities: No edema. Skin: Warm and dry.  LABORATORY DATA:  None for this visit   DIAGNOSTIC IMAGING:  Most recent mammogram: s/p bilateral mastectomies and reconstruction    ASSESSMENT AND PLAN:  Ms.. Blevins is a pleasant 74 y.o. female with history of Stage IA right breast invasive ductal carcinoma, ER+/PR+/HER2-, diagnosed in 03/2013, treated with bilateral mastectomies, and anti estrogen therapy with aromatase inhibitors x 5 years completed in 04/2018.  [facility] for surveillance and routine follow-up.   1. History of breast cancer:  Ms. Koelzer is currently clinically and radiographically without evidence of disease or recurrence of breast cancer.  She is doing well and would like to return here every year for continued surveillance and monitoring which we are happy to accommodate.   I encouraged her to call me with any questions or concerns before her next visit at the cancer center, and I would be happy to see her sooner, if needed.    2. Bone health:  Given Ms. Rolison age, history of breast cancer, and her previous anti-estrogen therapy with aromatase inhibitors,  she is at risk for bone demineralization. She was recommended to f/u with her PCP about her bone density testing.  She was given education on specific food and activities to promote bone health.  3. Cancer screening:  Due to Ms. Trachtenberg history and her age, she should receive screening for skin cancers, colon cancer, and gynecologic cancers. She was encouraged to follow-up with her PCP for appropriate cancer screenings.   4. Health maintenance and wellness promotion: Ms. Hedstrom was encouraged to consume 5-7 servings of fruits and vegetables per day. She was also encouraged to  engage in moderate to vigorous exercise for 30 minutes per day most days of the week. She was instructed to limit her alcohol consumption and continue to abstain from tobacco use.    Dispo:  -Return to cancer center in one year for LTS follow up   Total encounter time: 20 minutes*  Wilber Bihari, NP 03/30/20 8:12 AM Medical Oncology and Hematology [facility] [place], [place] [address] Tel. [phone]    Fax. [phone]  *Total Encounter Time as defined by the Centers for Medicare and Medicaid Services includes, in addition to the face-to-face time of a patient visit (documented in the note above) non-face-to-face time: obtaining and reviewing outside history, ordering and reviewing medications, tests or procedures, care coordination (communications with other health care professionals or caregivers) and documentation in the medical record.   Note: PRIMARY CARE PROVIDER [name], [place]

## 2020-03-30 ENCOUNTER — Encounter: Payer: Self-pay | Admitting: Adult Health

## 2020-04-03 ENCOUNTER — Telehealth: Payer: Self-pay

## 2020-04-03 NOTE — Telephone Encounter (Signed)
RN returned called, voicemail left for call back.

## 2020-04-04 ENCOUNTER — Other Ambulatory Visit: Payer: Self-pay | Admitting: *Deleted

## 2020-04-04 MED ORDER — VENLAFAXINE HCL ER 75 MG PO CP24
75.0000 mg | ORAL_CAPSULE | Freq: Every day | ORAL | 3 refills | Status: DC
Start: 2020-04-04 — End: 2020-05-06

## 2020-04-07 DIAGNOSIS — Z23 Encounter for immunization: Secondary | ICD-10-CM | POA: Diagnosis not present

## 2020-04-08 DIAGNOSIS — H5310 Unspecified subjective visual disturbances: Secondary | ICD-10-CM | POA: Diagnosis not present

## 2020-04-10 ENCOUNTER — Other Ambulatory Visit: Payer: Self-pay | Admitting: Oncology

## 2020-04-29 DIAGNOSIS — Z96651 Presence of right artificial knee joint: Secondary | ICD-10-CM | POA: Diagnosis not present

## 2020-04-29 DIAGNOSIS — M25562 Pain in left knee: Secondary | ICD-10-CM | POA: Diagnosis not present

## 2020-04-29 DIAGNOSIS — M1712 Unilateral primary osteoarthritis, left knee: Secondary | ICD-10-CM | POA: Diagnosis not present

## 2020-05-06 ENCOUNTER — Other Ambulatory Visit: Payer: Self-pay | Admitting: *Deleted

## 2020-05-06 MED ORDER — VENLAFAXINE HCL ER 150 MG PO CP24: ORAL_CAPSULE | ORAL | 1 refills | Status: DC

## 2020-05-13 ENCOUNTER — Encounter: Payer: Self-pay | Admitting: Cardiovascular Disease

## 2020-05-13 ENCOUNTER — Other Ambulatory Visit: Payer: Self-pay

## 2020-05-13 ENCOUNTER — Ambulatory Visit (INDEPENDENT_AMBULATORY_CARE_PROVIDER_SITE_OTHER): Payer: Medicare Other | Admitting: Cardiovascular Disease

## 2020-05-13 VITALS — BP 126/66 | HR 77 | Ht 62.0 in | Wt 155.0 lb

## 2020-05-13 DIAGNOSIS — I25119 Atherosclerotic heart disease of native coronary artery with unspecified angina pectoris: Secondary | ICD-10-CM | POA: Diagnosis not present

## 2020-05-13 DIAGNOSIS — R079 Chest pain, unspecified: Secondary | ICD-10-CM

## 2020-05-13 DIAGNOSIS — I10 Essential (primary) hypertension: Secondary | ICD-10-CM | POA: Diagnosis not present

## 2020-05-13 DIAGNOSIS — E782 Mixed hyperlipidemia: Secondary | ICD-10-CM

## 2020-05-13 NOTE — Progress Notes (Signed)
Cardiology Office Note:    Date:  05/13/2020   ID:  Tracey [name], DOB 1946-02-24, MRN [id number]  PCP:  [name], MD  [facility] Cardiologist:  Sherren Mocha, MD  [place] Electrophysiologist:  None   Referring MD: [name], MD   Chief Complaint  Patient presents with  . Coronary Artery Disease    History of Present Illness:    Tracey [name] is a 74 y.o. female with a hx of coronary artery disease, presenting for follow-up evaluation.  The patient initially presented with a non-ST elevation MI in January 2019 when she was in [place].  She was treated with PCI of the LAD with overlapping drug-eluting stents.  A follow-up nuclear stress test in March 2019 demonstrated no ischemia.  Comorbid medical conditions include hypertension, mixed hyperlipidemia, osteoarthritis with history of knee replacement, and history of breast cancer.  She had a follow-up stress Myoview study in October 2020 demonstrating no ischemia and normal LVEF.  The patient is here with her husband today.  She continues to have some issues with fatigue.  She is able to play 9 holes of golf now which is an improvement from last year.  She does get significant fatigue during her golf with muscle fatigue in her arms and legs.  She has some shortness of breath with activity as well.  She has developed intermittent chest discomfort that can occur when she is doing yard work.  This occurs in the center of the chest and feels like an ache.  She also develops pain in her left shoulder with exertion.  She experiences this with intercourse. This has scared her and we have discussed further evaluation today.   Past Medical History:  Diagnosis Date  . Allergy   . Anxiety    takes Ativan daily prn  . Arthritis   . Breast cancer ([place]) 2014   ER+/PR+/HEr2-,   . Bruises easily   . Colon polyps    2 polyps by report  . Diverticulosis   . GERD (gastroesophageal reflux disease)    occasionally takes Nexium    . Gout    takes Allopurinol daily and Colchicine daily prn;last attack 90yr ago  . Gout   . History of bladder infections    many yrs ago  . History of bronchitis    last time at least 847yrago  . History of stress incontinence   . Hyperlipidemia    takes Pravastatin daily  . Insomnia    takes Ambien nightly prn  . NSTEMI (non-ST elevated myocardial infarction) [place]  . OSA (obstructive sleep apnea)    on CPAP  . Osteoarthritis   . Peripheral edema    takes Furosemide daily prn  . PONV (postoperative nausea and vomiting)   . Postmenopausal hormone therapy   . Radiation 07/27/13-09/07/13   Right Breast x 31 treatments  . Rhinitis    uses Flonase prn  . Sinus congestion   . Status post breast reconstruction 10/15/14   [place]  . Tachycardia    takes Metoprolol daily  . Vent[place]sustained [place]   during sleep study 2009 with normal cardiac workup    Past Surgical History:  Procedure Laterality Date  . ABDOMINAL HYSTERECTOMY     with BSO  . APPENDECTOMY    . AXILLARY SENTINEL NODE BIOPSY Right 05/23/2013   Procedure: AXILLARY SENTINEL NODE BIOPSY;  Surgeon: ToOdis HollingsheadMD;  [facility]  Service: General;  Laterality: Right;  nuc med injection 7:00  . BREAST RECONSTRUCTION WITH PLACEMENT OF TISSUE EXPANDER AND FLEX HD (ACELLULAR HYDRATED DERMIS) Bilateral 05/23/2013   Procedure: BILATERAL BREAST RECONSTRUCTION WITH PLACEMENT OF TISSUE EXPANDER AND FLEX HD;  Surgeon: [name], MD;  Location: [place];  Service: Plastics;  Laterality: Bilateral;  . CARDIAC CATHETERIZATION  1999  . CATARACT EXTRACTION, BILATERAL Bilateral   . CHOLECYSTECTOMY    . COSMETIC SURGERY    . OTHER SURGICAL HISTORY  09/2017   had cyst removal from spine   . PERCUTANEOUS CORONARY STENT INTERVENTION (PCI-S)  08/2017   done in [place]    . REMOVAL OF BILATERAL TISSUE EXPANDERS WITH PLACEMENT OF BILATERAL BREAST  IMPLANTS Bilateral 05/17/2014   Procedure: REMOVAL OF BILATERAL TISSUE EXPANDERS WITH PLACEMENT OF BILATERAL BREAST IMPLANTS FOR RECONSTRUCTION;  Surgeon: [name], MD;  Location: [place];  Service: Plastics;  Laterality: Bilateral;  . TONSILLECTOMY    . TOTAL KNEE ARTHROPLASTY Right 07/26/2018   Procedure: TOTAL KNEE ARTHROPLASTY;  Surgeon: Paralee Cancel, MD;  Location: [facility];  Service: Orthopedics;  Laterality: Right;  70 mins  . TOTAL MASTECTOMY Bilateral 05/23/2013   Procedure: TOTAL MASTECTOMY;  Surgeon: [name], MD;  Location: [place];  Service: General;  Laterality: Bilateral;    Current Medications: Current Meds  Medication Sig  . allopurinol (ZYLOPRIM) 100 MG tablet TAKE 1 TABLET BY MOUTH ONCE DAILY  . amoxicillin (AMOXIL) 500 MG tablet Take 4 tablets by mouth as directed. Take 1 hr before dental appt  . apraclonidine (IOPIDINE) 0.5 % ophthalmic solution Place 1 drop into the right eye daily as needed (for droopy eye).   . ASPIRIN LOW DOSE 81 MG chewable tablet Chew 81 mg by mouth daily.   . celecoxib (CELEBREX) 200 MG capsule celecoxib 200 mg capsule  TAKE 1 CAPSULE BY MOUTH EVERY DAY  . Cholecalciferol (VITAMIN D3) 50 MCG (2000 UT) TABS Take 2,000 Units by mouth daily.  . clopidogrel (PLAVIX) 75 MG tablet Take 1 tablet (75 mg total) by mouth daily.  . colchicine 0.6 MG tablet TAKE 1 TABLET BY MOUTH EVERY DAY OR AS NEEDED  . cyanocobalamin (,VITAMIN B-12,) 1000 MCG/ML injection Inject 1,000 mcg into the muscle every 30 (thirty) days.  Marland Kitchen docusate sodium (COLACE) 100 MG capsule Take 1 capsule (100 mg total) by mouth 2 (two) times daily.  Marland Kitchen ezetimibe (ZETIA) 10 MG tablet TAKE 1 TABLET(10 MG) BY MOUTH DAILY  . fluticasone (FLONASE) 50 MCG/ACT nasal spray Place 1 spray into both nostrils daily.   . furosemide (LASIX) 40 MG tablet Take 40 mg by mouth daily as needed for fluid.   Marland Kitchen lisinopril (ZESTRIL) 10 MG tablet TAKE 1 TABLET(10 MG) BY MOUTH DAILY  . metoprolol succinate  (TOPROL-XL) 25 MG 24 hr tablet TAKE 1 TABLET(25 MG) BY MOUTH DAILY  . Multiple Vitamin (MULTI-VITAMIN) tablet Take by mouth.  . nitroGLYCERIN (NITROSTAT) 0.4 MG SL tablet PLACE 1 TABLET UNDER THE TONGUE EVERY 5 MINUTES AS NEEDED FOR CHEST PAIN WITH A MAX OF 3 DOSES  . NONFORMULARY OR COMPOUNDED ITEM Vitamin E vaginal cream 200u/ml.  One ml pv three times weekly.  . pantoprazole (PROTONIX) 40 MG tablet Take 1 tablet (40 mg total) by mouth daily.  . phentermine (ADIPEX-P) 37.5 MG tablet Take 1 tablet by mouth as needed.  . rosuvastatin (CRESTOR) 20 MG tablet Take 1 tablet (20 mg total) by mouth daily.  Marland Kitchen testosterone cypionate (DEPOTESTOSTERONE CYPIONATE) 200 MG/ML injection INJECT 1  ML INTO THE MUSCLE EVERY 28 DAYS. PATIENT IS DUE FOR APPOINTMENT  . venlafaxine XR (EFFEXOR-XR) 150 MG 24 hr capsule TAKE 1 CAPSULE BY MOUTH DAILY WITH BREAKFAST     Allergies:   Pseudoeph-hydrocodone-gg, Ivp dye [iodinated diagnostic agents], Sulfa antibiotics, and Tape   Social History   Socioeconomic History  . Marital status: Married    Spouse name: Not on file  . Number of children: 2  . Years of education: Not on file  . Highest education level: Not on file  Occupational History  . Not on file  Tobacco Use  . Smoking status: Former Smoker    Packs/day: 1.00    Years: 20.00    Pack years: 20.00    Start date: 06/09/1992  . Smokeless tobacco: Never Used  . Tobacco comment: quit at age 1  Vaping Use  . Vaping Use: Never used  Substance and Sexual Activity  . Alcohol use: Yes    Alcohol/week: 7.0 - 14.0 standard drinks    Types: 7 - 14 Glasses of wine per week  . Drug use: No    Comment: quit 24 years ago  . Sexual activity: Yes    Birth control/protection: Surgical  Other Topics Concern  . Not on file  Social History Narrative  . Not on file   Social Determinants of Health   Financial Resource Strain:   . Difficulty of Paying Living Expenses: Not on file  Food Insecurity:   . Worried  About Charity fundraiser in the Last Year: Not on file  . Ran Out of Food in the Last Year: Not on file  Transportation Needs:   . Lack of Transportation (Medical): Not on file  . Lack of Transportation (Non-Medical): Not on file  Physical Activity:   . Days of Exercise per Week: Not on file  . Minutes of Exercise per Session: Not on file  Stress:   . Feeling of Stress : Not on file  Social Connections:   . Frequency of Communication with Friends and Family: Not on file  . Frequency of Social Gatherings with Friends and Family: Not on file  . Attends Religious Services: Not on file  . Active Member of Clubs or Organizations: Not on file  . Attends Archivist Meetings: Not on file  . Marital Status: Not on file     Family History: The patient's family history includes ALS in her sister; Breast cancer in her maternal aunt, mother, and another family member; Breast cancer (age of onset: 92) in her maternal grandmother; Heart attack in her father, maternal grandfather, and paternal grandfather; Heart disease in her father.  ROS:   Please see the history of present illness.    Positive for fatigue and easy bruising.  All other systems reviewed and are negative.  EKGs/Labs/Other Studies Reviewed:    The following studies were reviewed today: Myoview Perfusion Scan 05-30-2019: Study Highlights    Nuclear stress EF: 67%.  No T wave inversion was noted during stress.  There was no ST segment deviation noted during stress.  This is a low risk study.   Normal perfusion. LVEF 67% with normal wall motion. This is a low risk study. Could not directly compare to study in 2019 from outside hospital, but no ischemia reported at that time.  EKG:  EKG is ordered today.  The ekg ordered today demonstrates NSR, within normal limits, HR 77 bpm  Recent Labs: 05/30/2019: NT-Pro BNP 156 03/28/2020: ALT 24; BUN 31;  Creatinine 1.34; Hemoglobin 14.0; Platelet Count 207; Potassium 4.2;  Sodium 139  Recent Lipid Panel    Component Value Date/Time   CHOL 128 05/22/2019 0756   TRIG 121 05/22/2019 0756   HDL 58 05/22/2019 0756   CHOLHDL 2.2 05/22/2019 0756   LDLCALC 49 05/22/2019 0756    Physical Exam:    VS:  BP 126/66   Pulse 77   Ht _0  (1.575 m)   Wt 155 lb (70.3 kg)   LMP 08/17/1977 (Approximate)   SpO2 100%   BMI 28.35 kg/m     Wt Readings from Last 3 Encounters:  05/13/20 155 lb (70.3 kg)  03/28/20 135 lb 4.8 oz (61.4 kg)  06/05/19 157 lb (71.2 kg)     GEN:  Well nourished, well developed in no acute distress HEENT: Normal NECK: No JVD; No carotid bruits LYMPHATICS: No lymphadenopathy CARDIAC: RRR, no murmurs, rubs, gallops RESPIRATORY:  Clear to auscultation without rales, wheezing or rhonchi  ABDOMEN: Soft, non-tender, non-distended MUSCULOSKELETAL:  No edema; No deformity  SKIN: Warm and dry NEUROLOGIC:  Alert and oriented x 3 PSYCHIATRIC:  Normal affect   ASSESSMENT:    1. Coronary artery disease involving native coronary artery of native heart with angina pectoris ([place])   2. Mixed hyperlipidemia   3. Essential hypertension    PLAN:    In order of problems listed above:  1. Exertional chest and left shoulder discomfort noted.  Patient treated with aspirin, clopidogrel, high intensity statin drug, and metoprolol succinate.  I have recommended checking a repeat Lexiscan stress Myoview today since her anginal symptoms seem to have progressed from last year.  Further plans/diagnostic studies pending her stress test result.  She will continue on her current medical therapy. 2. Lipids are excellent. Lifestyle modification discussed. Continue rosuvastatin and ezetimibe at current doses.  3. BP well-controlled. Continue lisinopril and metoprlol succinate.   Medication Adjustments/Labs and Tests Ordered: Current medicines are reviewed at length with the patient today.  Concerns regarding medicines are outlined above.  No orders of the defined  types were placed in this encounter.  No orders of the defined types were placed in this encounter.   There are no Patient Instructions on file for this visit.   Signed, Sherren Mocha, MD  05/13/2020 11:33 AM    [place]

## 2020-05-13 NOTE — Patient Instructions (Signed)
Medication Instructions:  Your provider recommends that you continue on your current medications as directed. Please refer to the Current Medication list given to you today.   *If you need a refill on your cardiac medications before your next appointment, please call your pharmacy*  Testing/Procedures: Your provider has requested that you have a lexiscan myoview. For further information please visit [email or web address]. Please follow instruction sheet, as given.  Follow-Up: At [facility], you and your health needs are our priority.  As part of our continuing mission to provide you with exceptional heart care, we have created designated Provider Care Teams.  These Care Teams include your primary Cardiologist (physician) and Advanced Practice Providers (APPs -  Physician Assistants and Nurse Practitioners) who all work together to provide you with the care you need, when you need it. Your next appointment:   12 month(s) The format for your next appointment:   In Person Provider:   You may see Michael Cooper, MD or one of the following Advanced Practice Providers on your designated Care Team:    Scott Weaver, PA-C  Vin Bhagat, PA-C   

## 2020-05-16 ENCOUNTER — Telehealth (HOSPITAL_COMMUNITY): Payer: Self-pay | Admitting: *Deleted

## 2020-05-16 NOTE — Telephone Encounter (Signed)
Left message on voicemail per DPR in reference to upcoming appointment scheduled on 05/20/20 at 10:45 with detailed instructions given per Myocardial Perfusion Study Information Sheet for the test. LM to arrive 15 minutes early, and that it is imperative to arrive on time for appointment to keep from having the test rescheduled. If you need to cancel or reschedule your appointment, please call the office within 24 hours of your appointment. Failure to do so may result in a cancellation of your appointment, and a $50 no show fee. Phone number given for call back for any questions.

## 2020-05-20 ENCOUNTER — Ambulatory Visit (HOSPITAL_COMMUNITY): Payer: Medicare Other | Attending: Cardiology

## 2020-05-20 ENCOUNTER — Other Ambulatory Visit: Payer: Self-pay

## 2020-05-20 DIAGNOSIS — R079 Chest pain, unspecified: Secondary | ICD-10-CM | POA: Insufficient documentation

## 2020-05-20 LAB — MYOCARDIAL PERFUSION IMAGING
LV dias vol: 49 mL (ref 46–106)
LV sys vol: 14 mL
Peak HR: 91 {beats}/min
Rest HR: 67 {beats}/min
SDS: 2
SRS: 0
SSS: 2
TID: 1.03

## 2020-05-20 MED ORDER — TECHNETIUM TC 99M TETROFOSMIN IV KIT
10.1000 | PACK | Freq: Once | INTRAVENOUS | Status: AC | PRN
  Administered 2020-05-20: 10.1 via INTRAVENOUS
  Filled 2020-05-20: qty 11

## 2020-05-20 MED ORDER — REGADENOSON 0.4 MG/5ML IV SOLN
0.4000 mg | Freq: Once | INTRAVENOUS | Status: AC
  Administered 2020-05-20: 0.4 mg via INTRAVENOUS

## 2020-05-20 MED ORDER — TECHNETIUM TC 99M TETROFOSMIN IV KIT
31.7000 | PACK | Freq: Once | INTRAVENOUS | Status: AC | PRN
  Administered 2020-05-20: 31.7 via INTRAVENOUS
  Filled 2020-05-20: qty 32

## 2020-06-03 ENCOUNTER — Encounter: Payer: Self-pay | Admitting: Neurology

## 2020-06-03 ENCOUNTER — Ambulatory Visit (INDEPENDENT_AMBULATORY_CARE_PROVIDER_SITE_OTHER): Payer: Medicare Other | Admitting: Neurology

## 2020-06-03 VITALS — BP 120/76 | HR 81 | Ht 62.0 in | Wt 152.0 lb

## 2020-06-03 DIAGNOSIS — I25119 Atherosclerotic heart disease of native coronary artery with unspecified angina pectoris: Secondary | ICD-10-CM

## 2020-06-03 DIAGNOSIS — R4189 Other symptoms and signs involving cognitive functions and awareness: Secondary | ICD-10-CM | POA: Diagnosis not present

## 2020-06-03 DIAGNOSIS — G4731 Primary central sleep apnea: Secondary | ICD-10-CM | POA: Diagnosis not present

## 2020-06-03 DIAGNOSIS — G4752 REM sleep behavior disorder: Secondary | ICD-10-CM

## 2020-06-03 NOTE — Progress Notes (Signed)
SLEEP MEDICINE CLINIC   Provider:  Larey Seat, M D  Referring Provider: [name], MD Primary Care Physician:  [name], MD  Chief Complaint  Patient presents with  . Follow-up    pt alone, rm 11 presents today for follow up. states overall things are well. she asked about a new machine. in discussing she is eligible for a new machine 12/2020. however currently her machine is a respironics and may be a part of a recall. she does use a oxone cleaning device. informed her about registrering her device. DME [facility]    HPI: 06-03-2020:  Tracey [name] is a 74 y.o. female , seen here in a RV  rm 11 presents today for follow up. states overall things are well. she asked about a new machine. in discussing she is eligible for a new machine 12/2020. however currently her machine is a respironics and may be a part of a recall as she does use a ozone cleaning device. RN informed her about registrering her device. DME [facility].  I have the pleasure of seeing Tracey [name] today, a longstanding and established patient who has used positive airway pressure therapy for the treatment of complex sleep apnea.  Her sleep apnea complexity is related to underlying causes which we have listed in her past medical history.  She she would be due for new machine as of May 2022 her current C-Flex CPAP is set with a pressure window, does not produce a lot of air leaks, and she is using it 63.3% of the time compliantly.  Actually she has used the machine 26 out of 30 days.  Some nights with use at time is shortened.  She sometimes uses the connection to the machine at night.  Her average AHI is 6.5 which has been a little higher than last year.  She is using a humidifier, she has a ramp time of 5 minutes and starts at 4 cmH2O with a maximum pressure option of 12 cm of water.  Her 95th percentile pressure used is at 12 cmH2O so she is maxing out on the upper limits.  I would rather until May increase  her current pressure by 1 cm and will order a repeat sleep study which can be a home sleep test for her on or about April so that we are ready with a new baseline.   I like to have her tested before she needs the new machine.     Originally as a referral from Dr. Ardeth Perfect -  She fell out of bed, had to get stitches in the ED- she has more and more dream enactment, often nightmares, and not leaving the bed. She had vivd dreams since childhood, and her grandson has night terrors. She also hears music when there is none, but only when a mechanical noise is in the background.  She never had tinnitus, but a sound as if a radio produces  static sounds was perceived.  On December10th 2019 she had a knee surgery, and the pain medications was giving her horrible delusions- she was terrified. Complex visual hallucinations - these were visions of people, snakes, no voices. People that looked into the window. Delirium.  She called her MD and the police, and her husband was helpfess.   She is OK with her apnea treatment. This year has been quiet , no travels due to Covid 19, and she enjoyed being home - sad only because she missed a family re-union.  Her son  died in September- of substance abuse.   Tracey [name] provided me with a high compliance record 93.3% for the last 30 days prior to 29 May 2019.  Average use a time for all days 7 hours 55 minutes, mean pressure of CPAP 9.1 average device pressure 11.4 average time in large leak 17 minutes average AHI 6.4.  As it is a 6.4 is related to the leak time however we do not need to change settings.   First visit upon referral with Dr Ardeth Perfect. Chief complaint according to patient : " re evaluate my apnea and care." The patient reports that she moved to the [place] area about 3 years ago and still gets supplies for her established CPAP per Mail. She was diagnosed with obstructive sleep apnea in [place], in [place]. The [facility] posted the  sleep center. The patient was diagnosed on 05/02/2008 the study revealed an AHI of 23 was mild decreases in oxygen level as well as continuous snoring. Then CPAP was used for the second night of the split night polysomnography and her oxygen level improved to normal her snoring resolved and the breathing was totally regulated according to Dr. Shelton Silvas note. Concerning was that there was a ventricular tachycardia as of 27 beats. The patient was evaluated by cardiology afterwards the finding could never be reciprocated. She continued to use CPAP for the last 7 years compliantly but she is a little tired of using a nasal mask that has become uncomfortable and his pressure marks on her face. She is also not sure to what degree apnea still present at this time. The patient stays about 7 months of the year here in [place], and 5 months in [place]. Usually they travel from Thanksgiving to Easter. Her husband witnesses snoring when she naps, but not while on CPAP. Her insomnia improved drastically after CPAP.   Sleep habits are as follows: The patient goes to bed between 9.30 and 10 PM, usually falls asleep promptly. She prefers the left side to sleep , but wakes sometimes up on her back.  She learned to sleep on her back after breast cancer surgery ( diagnosed Aug 8th 2014 ). The bedroom is described as core, quiet and dark. The patient shares a bedroom with her husband. They're also 2 dogs in the bedroom on the bed. She rises usually once to go to the bathroom sometimes twice. She can fall asleep again fairly promptly. She is not a restless sleeper not kicking or moving or not excessively at night. She rises in the morning at 5 sometimes even 8. She usually wakes spontaneously not based on an alarm setting. She averages 7-8 hours of nocturnal sleep.  She reports vivid dreams, "repetitive and in full color".  Some mornings she feels refreshed and restored but not all the time. She is still recovering from  her breast reconstruction surgery. She endorsed some snoring, joint pain, aching muscles and even some dizziness. She also has right shoulder bursitis which limits her ability to sleep on the right. She naps 3 out of 7 days, once  daily, a nap will last  2 hours unless she sets an alarm.   Tracey [name] this past medical history includes gout, hyperlipidemia, hypertension, palpitations probably due to nonsustained ventricular tachycardia. In addition breast cancer of which her mother maternal aunt maternal grandmother and maternal great-grandmother were also affected. She is a history of allergic rhinitis her sister died in October 30, 2011 of ALS. Sleep medical history and family sleep history: Her  father was diagnosed with OSA never used CPAP ,  had an MI at age 39. Social history: non smoker, quit at age 45. ETOH , 1-2 at night. Caffeine use : none. Decaffeinated tea and coffee. Average and regular golf player. She likes winter sports, especially skiing but uses the left dangerous slope these days.  Interval history from 05-20-15. Hearing has been willing to undergo a new sleep study and attended nocturnal polysomnography on 04-09-15. She was diagnosed with a very mild AHI of 5.2 , supine sleep  AHI became 12.4  and it was further evident that the patient did not get into REM sleep. For this reason I feel it is much more important that the patient stays on CPAP given that her apnea was mild and she was fitted with a new mask. The new mass does not cause pressure marks around her nose and does not entangle her hair. She is using a dream wear interface, which [employer] had to explicitly order for her.  Interval history from 12/23/2015. Tracey [name] has happily returned from [place], I have the pleasure today to see her CPAP download which confirms and 96.7% compliance average user time 6 hours and 39 minutes of CPAP.The device is a C flex set at 8 cm water pressure with a ramp time of 30 minutes. The REM time is a little too  long and we will reduce it to 10 minutes. Her husband has made her aware that she still snores when she doesn't use a CPAP. She has become more compliant she endorsed only 2 points today on the geriatric depression store, 7 points on the Epworth sleepiness score and the fatigue severity at 29.  Interval history from 02/26/2016. Tracey [name] is here today relaxed after a week vacation. She reports sleeping so much better with the new CPAP machine. Her sleep is deeper but she does not recall having nightmares she has fallen out of bed 1 night it was actually the third night she used the new CPAP. She feels her sleep is less fragmented, more sound, and more refreshing and restorative. Her download revealed 100% compliance over the last 30 days was 97% compliance 4 hours of use, average user time is 7 hours and 41 minutes. This is excellent compliance with CPAP is set at 8 cm water we did  use a 10 minute ramp function. Her snoring has of course been alleviated using CPAP, which pleases her husband. She has a residual AHI of 8.1 but given her decrease and sleepiness/ fatigue I would not want to change the settings.  Interval history from 12/21/2016, Tracey [name] has been to winter in [place] and had a additional medication in [place]. She is now doing well but during her winter stay in [place] she developed shortness of breath and apparently more severe hypoxemia and apnea. She saw a pulmonologist at the location would tested her oxygen levels while walking and also diagnosed her with type a flu. She had been coughing for over 6 weeks when the flu finally converted to bronchitis and then resolved. She remained air hungry and short of breath for much of her winter time. I'm able to state today that she feels back to her baseline. But we need to discuss how we could prevent altitude related apneas which can often be central in nature and if she should use an AutoPap with an automatic pressure window. The patient is a  very highly compliant CPAP user her compliance rate was 93.3% with an average user  time of 7 hours and 6 minutes. She has some air leaks but not significant once CPAP is set at 8 cm water pressure with a residual AHI of 17.4. She has a short ramp time. Most of the AHI is related to shallow breathing. Her average central apnea index is 1.1 and obstructive apnea index is 3.3. Our goal is is to have an AHI of below 5. I will ask [employer] to change her to an auto titration from 5-10 cm water. She continues to use a dream wear interface. She would like the non-prong version of the dream wear interface. We will also meet again in late September or October to discuss if she needs additional oxygen at night during her stays at high altitudes.  05-24-2017, Tracey [name] is planning again to spend the winter in [place]. She asked me to make sure that she will have oxygen available for nightly use in the upper inner air. Last year she stated she was miserable on CPAP alone. She is a very compliant CPAP use of his 100% compliance and an average user time of 9 hours and 2 minutes, uses the machine as an auto CPAP between 5 and 15 cm water, average pressure is 9.9 cm water and the average residual AHI is 5.1. The machine allows between 5 and 10 cm water pressure only, given that the patient does not have central apneas I will increase the upper limits to 12 cm water. I would also investigate is able care can help [place] with oxygen in [place] ( she knows an oxygen supplier in [place]- "[employer]" ) .   05-30-2018, The patient suffered a heart attack in January of this year, and had a rough year- needs bilateral knee replacements. Her step- son died 86 days ago at age 87 years , with alcoholism and bipolar disease, died of multi organ -liver failure. She brought her CPAP machine - and would like to change her interface .she wants to return to a pillairo or nasal pillow. Her machine was issued in  August 2016-  after a AHI of only 5.2- but loud snoring and RERAS. UARS. Residual AHI was 7/h at 90% pressure of 10.2 cm water.  Compliance was 87% for time, and 26/ 30 days.    Review of Systems: Out of a complete 14 system review, the patient complains of only the following symptoms, and all other reviewed systems are negative.Snoring. Grieving - lost mother and sister to breast cancer in a short time and was diagnosed with breast cancer herself 3.5 years ago.  On antihormone therapy. She takes statins.  Epworth score 7/24  , Fatigue severity score again at  41/ 63, geriatric depression score 2 ,     her machine is 86.75  years old .  AHI increased . Nasal pillow- dream wear causes hair trouble- will switch back to a nasal pillow.  Settings are AUTO on a dream station, settings are at 5-12 with 2 cm water EPR. I increased to 13 cm.       Social History   Socioeconomic History  . Marital status: Married    Spouse name: Not on file  . Number of children: 2  . Years of education: Not on file  . Highest education level: Not on file  Occupational History  . Not on file  Tobacco Use  . Smoking status: Former Smoker    Packs/day: 1.00    Years: 20.00    Pack years: 20.00    Start  date: 06/09/1992  . Smokeless tobacco: Never Used  . Tobacco comment: quit at age 23  Vaping Use  . Vaping Use: Never used  Substance and Sexual Activity  . Alcohol use: Yes    Alcohol/week: 7.0 - 14.0 standard drinks    Types: 7 - 14 Glasses of wine per week  . Drug use: No    Comment: quit 24 years ago  . Sexual activity: Yes    Birth control/protection: Surgical  Other Topics Concern  . Not on file  Social History Narrative  . Not on file   Social Determinants of Health   Financial Resource Strain:   . Difficulty of Paying Living Expenses: Not on file  Food Insecurity:   . Worried About Charity fundraiser in the Last Year: Not on file  . Ran Out of Food in the Last Year: Not on file  Transportation  Needs:   . Lack of Transportation (Medical): Not on file  . Lack of Transportation (Non-Medical): Not on file  Physical Activity:   . Days of Exercise per Week: Not on file  . Minutes of Exercise per Session: Not on file  Stress:   . Feeling of Stress : Not on file  Social Connections:   . Frequency of Communication with Friends and Family: Not on file  . Frequency of Social Gatherings with Friends and Family: Not on file  . Attends Religious Services: Not on file  . Active Member of Clubs or Organizations: Not on file  . Attends Archivist Meetings: Not on file  . Marital Status: Not on file  Intimate Partner Violence:   . Fear of Current or Ex-Partner: Not on file  . Emotionally Abused: Not on file  . Physically Abused: Not on file  . Sexually Abused: Not on file    Family History  Problem Relation Age of Onset  . Breast cancer Mother        possible inflammatory breast cancer  . Heart attack Father   . Heart disease Father   . ALS Sister   . Breast cancer Maternal Grandmother 72  . Heart attack Maternal Grandfather   . Heart attack Paternal Grandfather   . Breast cancer Maternal Aunt        dx in her 45s  . Breast cancer Other        maternal great grandmother; dx in her 57s    Past Medical History:  Diagnosis Date  . Allergy   . Anxiety    takes Ativan daily prn  . Arthritis   . Breast cancer ([facility]) 2014   ER+/PR+/HEr2-,   . Bruises easily   . Colon polyps    2 polyps by report  . Diverticulosis   . GERD (gastroesophageal reflux disease)    occasionally takes Nexium   . Gout    takes Allopurinol daily and Colchicine daily prn;last attack 63yr ago  . Gout   . History of bladder infections    many yrs ago  . History of bronchitis    last time at least 850yrago  . History of stress incontinence   . Hyperlipidemia    takes Pravastatin daily  . Insomnia    takes Ambien nightly prn  . NSTEMI (non-ST elevated myocardial infarction) ([facility]/2019   . OSA (obstructive sleep apnea)    on CPAP  . Osteoarthritis   . Peripheral edema    takes Furosemide daily prn  . PONV (postoperative nausea and vomiting)   .  Postmenopausal hormone therapy   . Radiation 07/27/13-09/07/13   Right Breast x 31 treatments  . Rhinitis    uses Flonase prn  . Sinus congestion   . Status post breast reconstruction 10/15/14   Bilateral implant removal and DIEP performed in [place], [place]  . Tachycardia    takes Metoprolol daily  . Ventricular tachycardia, non-sustained ([facility])    during sleep study 2009 with normal cardiac workup    Past Surgical History:  Procedure Laterality Date  . ABDOMINAL HYSTERECTOMY     with BSO  . APPENDECTOMY    . AXILLARY SENTINEL NODE BIOPSY Right 05/23/2013   Procedure: AXILLARY SENTINEL NODE BIOPSY;  Surgeon: [name], MD;  Location: [facility];  Service: General;  Laterality: Right;  nuc med injection 7:00  . BREAST RECONSTRUCTION WITH PLACEMENT OF TISSUE EXPANDER AND FLEX HD (ACELLULAR HYDRATED DERMIS) Bilateral 05/23/2013   Procedure: BILATERAL BREAST RECONSTRUCTION WITH PLACEMENT OF TISSUE EXPANDER AND FLEX HD;  Surgeon: [name], MD;  Location: [facility];  Service: Plastics;  Laterality: Bilateral;  . CARDIAC CATHETERIZATION  1999  . CATARACT EXTRACTION, BILATERAL Bilateral   . CHOLECYSTECTOMY    . COSMETIC SURGERY    . OTHER SURGICAL HISTORY  09/2017   had cyst removal from spine   . PERCUTANEOUS CORONARY STENT INTERVENTION (PCI-S)  08/2017   done in [place]    . REMOVAL OF BILATERAL TISSUE EXPANDERS WITH PLACEMENT OF BILATERAL BREAST IMPLANTS Bilateral 05/17/2014   Procedure: REMOVAL OF BILATERAL TISSUE EXPANDERS WITH PLACEMENT OF BILATERAL BREAST IMPLANTS FOR RECONSTRUCTION;  Surgeon: [name], MD;  Location: [facility];  Service: Plastics;  Laterality: Bilateral;  . TONSILLECTOMY    . TOTAL KNEE ARTHROPLASTY Right 07/26/2018   Procedure: TOTAL KNEE ARTHROPLASTY;  Surgeon: Paralee Cancel, MD;  Location: [facility];   Service: Orthopedics;  Laterality: Right;  70 mins  . TOTAL MASTECTOMY Bilateral 05/23/2013   Procedure: TOTAL MASTECTOMY;  Surgeon: [name], MD;  Location: [facility];  Service: General;  Laterality: Bilateral;    Current Outpatient Medications  Medication Sig Dispense Refill  . allopurinol (ZYLOPRIM) 100 MG tablet TAKE 1 TABLET BY MOUTH ONCE DAILY 90 tablet 0  . amoxicillin (AMOXIL) 500 MG tablet Take 4 tablets by mouth as directed. Take 1 hr before dental appt    . apraclonidine (IOPIDINE) 0.5 % ophthalmic solution Place 1 drop into the right eye daily as needed (for droopy eye).   2  . ASPIRIN LOW DOSE 81 MG chewable tablet Chew 81 mg by mouth daily.   0  . celecoxib (CELEBREX) 200 MG capsule celecoxib 200 mg capsule  TAKE 1 CAPSULE BY MOUTH EVERY DAY    . Cholecalciferol (VITAMIN D3) 50 MCG (2000 UT) TABS Take 2,000 Units by mouth daily.    . clopidogrel (PLAVIX) 75 MG tablet Take 1 tablet (75 mg total) by mouth daily. 90 tablet 3  . colchicine 0.6 MG tablet TAKE 1 TABLET BY MOUTH EVERY DAY OR AS NEEDED 30 tablet 0  . cyanocobalamin (,VITAMIN B-12,) 1000 MCG/ML injection Inject 1,000 mcg into the muscle every 30 (thirty) days.    Marland Kitchen docusate sodium (COLACE) 100 MG capsule Take 1 capsule (100 mg total) by mouth 2 (two) times daily. 10 capsule 0  . ezetimibe (ZETIA) 10 MG tablet TAKE 1 TABLET(10 MG) BY MOUTH DAILY 90 tablet 3  . fluticasone (FLONASE) 50 MCG/ACT nasal spray Place 1 spray into both nostrils daily.     . furosemide (LASIX) 40 MG tablet Take  40 mg by mouth daily as needed for fluid.     Marland Kitchen lisinopril (ZESTRIL) 10 MG tablet TAKE 1 TABLET(10 MG) BY MOUTH DAILY 90 tablet 3  . metoprolol succinate (TOPROL-XL) 25 MG 24 hr tablet TAKE 1 TABLET(25 MG) BY MOUTH DAILY 90 tablet 1  . Multiple Vitamin (MULTI-VITAMIN) tablet Take by mouth.    . nitroGLYCERIN (NITROSTAT) 0.4 MG SL tablet PLACE 1 TABLET UNDER THE TONGUE EVERY 5 MINUTES AS NEEDED FOR CHEST PAIN WITH A MAX OF 3 DOSES 25  tablet 1  . NONFORMULARY OR COMPOUNDED ITEM Vitamin E vaginal cream 200u/ml.  One ml pv three times weekly. 36 each 1  . pantoprazole (PROTONIX) 40 MG tablet Take 1 tablet (40 mg total) by mouth daily. 90 tablet 3  . phentermine (ADIPEX-P) 37.5 MG tablet Take 1 tablet by mouth as needed.    . rosuvastatin (CRESTOR) 20 MG tablet Take 1 tablet (20 mg total) by mouth daily. 90 tablet 3  . testosterone cypionate (DEPOTESTOSTERONE CYPIONATE) 200 MG/ML injection INJECT 1 ML INTO THE MUSCLE EVERY 28 DAYS. PATIENT IS DUE FOR APPOINTMENT    . venlafaxine XR (EFFEXOR-XR) 150 MG 24 hr capsule TAKE 1 CAPSULE BY MOUTH DAILY WITH BREAKFAST 90 capsule 1   No current facility-administered medications for this visit.    Allergies as of 06/03/2020 - Review Complete 06/03/2020  Allergen Reaction Noted  . Pseudoeph-hydrocodone-gg Other (See Comments) 02/03/2019  . Ivp dye [iodinated diagnostic agents] Hives 06/09/2012  . Sulfa antibiotics Swelling 06/09/2012  . Tape Other (See Comments) 07/04/2018    Vitals: BP 120/76   Pulse 81   Ht 5' 2"  (1.575 m)   Wt 152 lb (68.9 kg)   LMP 08/17/1977 (Approximate)   BMI 27.80 kg/m  Last Weight:  Wt Readings from Last 1 Encounters:  06/03/20 152 lb (68.9 kg)   MCE:YEMV mass index is 27.8 kg/m.     Last Height:   Ht Readings from Last 1 Encounters:  06/03/20 5' 2"  (1.575 m)    Physical exam: hoarse voice. Looking well and well groomed.  No loss of smell or taste- never had covid 19, had 2 vaccines and a booster.  General: The patient is awake, alert and appears not in acute distress.  The patient is well groomed. She reports sweating a lot.  Head: Normocephalic, atraumatic.  Neck is supple. Mallampati 4,  neck circumference: 16.25  Nasal airflow unrestricted, TMJ is not evident. Retrognathia is not seen.  She has a crowded lower jar. Cardiovascular:  Regular rate and rhythm , without  murmurs or carotid bruit, and without distended neck  veins. Respiratory: Lungs are clear to auscultation. Skin:  Without evidence of edema, or rashTrunk: BMI is 28. The patient's posture is erect.   Neurologic exam :Speech is fluent,  without  dysarthria, dysphonia or aphasia.  Mood and affect are appropriate.  Cranial nerves:Pupils are equal and briskly reactive to light. Extraocular movement are intact. Hearing to finger rub intact. Facial sensation intact to fine touch. Facial motor strength is symmetric and tongue and uvula moves midline. Shoulder shrug was symmetrical.  Muscle tone increased, mild cogwheeling, bilaterally mild tremor, no ataxia.   Assessment and Plan:   The patient was advised of the nature of the diagnosed sleep disorder , the treatment options and risks for general a health and wellness arising from not treating the condition.   I spent more than 25 minutes of face to face time with the patient. Greater than 50% of time  was spent in counseling and coordination of care. We have discussed the diagnosis and differential and I answered the patient's questions.    COMPLEX sleep apnea with CPAP,  History of heart disease now not well controlled, needs pressure increase.  REM BD, 4 cousins with PD one sister died of ALS. No tremor -Now some mild cogwheeling, may need balance and gait PT>    I ordered to change her CPAP pressure - to 13 cm water and will repeat HST in March/ April for a new device. She d/c the ozone cleaner. , but I will recommend melatonin for her - to suppress REM BD.  No signs of PD, no cog-wheeling. She has 4 cousins with PD.  Yearly RV with gait examination form now on.     Tracey Partridge Jazlen Ogarro MD  06/03/2020   CC: [name], [place] [employer],  [place] [id number]

## 2020-06-10 ENCOUNTER — Other Ambulatory Visit: Payer: Self-pay | Admitting: Cardiovascular Disease

## 2020-07-03 DIAGNOSIS — R42 Dizziness and giddiness: Secondary | ICD-10-CM | POA: Diagnosis not present

## 2020-07-03 DIAGNOSIS — N1832 Chronic kidney disease, stage 3b: Secondary | ICD-10-CM | POA: Diagnosis not present

## 2020-07-03 DIAGNOSIS — J069 Acute upper respiratory infection, unspecified: Secondary | ICD-10-CM | POA: Diagnosis not present

## 2020-07-03 DIAGNOSIS — I129 Hypertensive chronic kidney disease with stage 1 through stage 4 chronic kidney disease, or unspecified chronic kidney disease: Secondary | ICD-10-CM | POA: Diagnosis not present

## 2020-07-03 DIAGNOSIS — R059 Cough, unspecified: Secondary | ICD-10-CM | POA: Diagnosis not present

## 2020-07-03 DIAGNOSIS — Z1152 Encounter for screening for COVID-19: Secondary | ICD-10-CM | POA: Diagnosis not present

## 2020-07-16 DIAGNOSIS — Z79890 Hormone replacement therapy (postmenopausal): Secondary | ICD-10-CM | POA: Diagnosis not present

## 2020-07-17 ENCOUNTER — Other Ambulatory Visit: Payer: Self-pay | Admitting: Cardiovascular Disease

## 2020-07-17 NOTE — Telephone Encounter (Signed)
rx refill

## 2020-07-27 ENCOUNTER — Other Ambulatory Visit: Payer: Self-pay | Admitting: Cardiovascular Disease

## 2020-07-28 DIAGNOSIS — I10 Essential (primary) hypertension: Secondary | ICD-10-CM | POA: Diagnosis not present

## 2020-07-28 DIAGNOSIS — B029 Zoster without complications: Secondary | ICD-10-CM | POA: Diagnosis not present

## 2020-07-28 DIAGNOSIS — N182 Chronic kidney disease, stage 2 (mild): Secondary | ICD-10-CM | POA: Diagnosis not present

## 2020-07-29 DIAGNOSIS — B029 Zoster without complications: Secondary | ICD-10-CM | POA: Diagnosis not present

## 2020-08-13 DIAGNOSIS — L821 Other seborrheic keratosis: Secondary | ICD-10-CM | POA: Diagnosis not present

## 2020-08-13 DIAGNOSIS — I788 Other diseases of capillaries: Secondary | ICD-10-CM | POA: Diagnosis not present

## 2020-08-13 DIAGNOSIS — L578 Other skin changes due to chronic exposure to nonionizing radiation: Secondary | ICD-10-CM | POA: Diagnosis not present

## 2020-08-13 DIAGNOSIS — L820 Inflamed seborrheic keratosis: Secondary | ICD-10-CM | POA: Diagnosis not present

## 2020-08-13 DIAGNOSIS — D2262 Melanocytic nevi of left upper limb, including shoulder: Secondary | ICD-10-CM | POA: Diagnosis not present

## 2020-08-13 DIAGNOSIS — S3092XA Unspecified superficial injury of abdominal wall, initial encounter: Secondary | ICD-10-CM | POA: Diagnosis not present

## 2020-08-29 DIAGNOSIS — R944 Abnormal results of kidney function studies: Secondary | ICD-10-CM | POA: Diagnosis not present

## 2020-08-29 DIAGNOSIS — R7989 Other specified abnormal findings of blood chemistry: Secondary | ICD-10-CM | POA: Diagnosis not present

## 2020-08-29 DIAGNOSIS — L659 Nonscarring hair loss, unspecified: Secondary | ICD-10-CM | POA: Diagnosis not present

## 2020-09-07 ENCOUNTER — Encounter: Payer: Self-pay | Admitting: Adult Health

## 2020-09-12 NOTE — Progress Notes (Deleted)
CLINIC:  Survivorship   REASON FOR VISIT:  Routine follow-up for history of breast cancer.   BRIEF ONCOLOGIC HISTORY:   (1)  status post right breast upper outer quadrant biopsy 03/24/2013 for a pT1b cN0, stage IA invasive ductal carcinoma, grade 1, strongly estrogen and progesterone receptor positive, HER-2 negative  (2) status post bilateral mastectomies 05/23/2013 showing:             (a) on the right, a pT1b pN0, stage invasive ductal carcinoma, grade 1, again HER-2 negative             (b) on the left, no evidence of malignancy  (3) Oncotype DX score of 20 predicted a rate of distant recurrence within 10 years of 13% if the patient's only systemic treatment was tamoxifen for 5 years.  (4) a positive margin required adjuvant radiation, completed January 2015 in [place]  (5) status post bilateral implant placement January 2015             (a) with impending right implant rupture February 2016 underwent bilateral DIEP reconstruction under Dr. Kandyce Rud in [place] (fax [phone])  (6)  started on anastrozole, mid September 2014, prior to definitive surgery; continued postop; bone density 07/31/2013 was normal             (a) repeat bone density 02/03/2016 showed a T score of -0.1 (normal             (b) switched to tamoxifen November 2018 because of arthralgias secondary to the anastrozole             (c) switched to exemestane as of 12/13/2017             (d) completing five years of anti-estrogens September 2019   INTERVAL HISTORY:  Tracey [name] recently sent a message to [place] about changing her Effexor 115m ER to Zoloft.  She was set for an appointment to review this change, however     REVIEW OF SYSTEMS:  Review of Systems  Constitutional: Negative for appetite change, chills, fatigue, fever and unexpected weight change.  HENT:   Negative for hearing loss and lump/mass.   Eyes: Negative for eye problems and icterus.  Respiratory: Negative for chest  tightness, cough and shortness of breath.   Cardiovascular: Negative for chest pain, leg swelling and palpitations.  Gastrointestinal: Negative for abdominal distention, abdominal pain, constipation, diarrhea, nausea and vomiting.  Endocrine: Negative for hot flashes.  Genitourinary: Negative for difficulty urinating.   Musculoskeletal: Negative for arthralgias.  Skin: Negative for itching and rash.  Neurological: Negative for dizziness, extremity weakness, headaches and numbness.  Hematological: Negative for adenopathy. Does not bruise/bleed easily.  Psychiatric/Behavioral: Negative for depression. The patient is not nervous/anxious.    Breast: Denies any new nodularity, masses, tenderness, nipple changes, or nipple discharge.       PAST MEDICAL/SURGICAL HISTORY:  Past Medical History:  Diagnosis Date  . Allergy   . Anxiety    takes Ativan daily prn  . Arthritis   . [place]   ER+/PR+/HEr2-,   . Bruises easily   . Colon polyps    2 polyps by report  . Diverticulosis   . GERD (gastroesophageal reflux disease)    occasionally takes Nexium   . Gout    takes Allopurinol daily and Colchicine daily prn;last attack 384yrago  . Gout   . History of bladder infections    many yrs ago  . History of bronchitis    last  time at least 6yr ago  . History of stress incontinence   . Hyperlipidemia    takes Pravastatin daily  . Insomnia    takes Ambien nightly prn  . NSTEMI (non-ST elevated myocardial infarction) ([facility] 08/2017  . OSA (obstructive sleep apnea)    on CPAP  . Osteoarthritis   . Peripheral edema    takes Furosemide daily prn  . PONV (postoperative nausea and vomiting)   . Postmenopausal hormone therapy   . Radiation 07/27/13-09/07/13   Right Breast x 31 treatments  . Rhinitis    uses Flonase prn  . Sinus congestion   . Status post breast reconstruction 10/15/14   [place]  . Tachycardia    takes  Metoprolol daily  . [place]ust[place]    during sleep study 2009 with normal cardiac workup   Past Surgical History:  Procedure Laterality Date  . ABDOMINAL HYSTERECTOMY     with BSO  . APPENDECTOMY    . AXILLARY SENTINEL NODE BIOPSY Right 05/23/2013   Procedure: AXILLARY SENTINEL NODE BIOPSY;  Surgeon: TOdis Hollingshead MD;  [facility]  Service: General;  Laterality: Right;  nuc med injection 7:00  . BREAST RECONSTRUCTION WITH PLACEMENT OF TISSUE EXPANDER AND FLEX HD (ACELLULAR HYDRATED DERMIS) Bilateral 05/23/2013   Procedure: BILATERAL BREAST RECONSTRUCTION WITH PLACEMENT OF TISSUE EXPANDER AND FLEX HD;  Surgeon: DCrissie Reese MD;  [facility]  Service: Plastics;  Laterality: Bilateral;  . CARDIAC CATHETERIZATION  1999  . CATARACT EXTRACTION, BILATERAL Bilateral   . CHOLECYSTECTOMY    . COSMETIC SURGERY    . OTHER SURGICAL HISTORY  09/2017   had cyst removal from spine   . PERCUTANEOUS CORONARY STENT INTERVENTION (PCI-S)  08/2017   [place]   . REMOVAL OF BILATERAL TISSUE EXPANDERS WITH PLACEMENT OF BILATERAL BREAST IMPLANTS Bilateral 05/17/2014   Procedure: REMOVAL OF BILATERAL TISSUE EXPANDERS WITH PLACEMENT OF BILATERAL BREAST IMPLANTS FOR RECONSTRUCTION;  Surgeon: DCrissie Reese MD;  [facility] [facility]  Service: Plastics;  Laterality: Bilateral;  . TONSILLECTOMY    . TOTAL KNEE ARTHROPLASTY Right 07/26/2018   Procedure: TOTAL KNEE ARTHROPLASTY;  Surgeon: OParalee Cancel MD;  [facility];  Service: Orthopedics;  Laterality: Right;  70 mins  . TOTAL MASTECTOMY Bilateral 05/23/2013   Procedure: TOTAL MASTECTOMY;  Surgeon: TOdis Hollingshead MD;  [place] [facility]  Service: General;  Laterality: Bilateral;     ALLERGIES:  Allergies  Allergen Reactions  . Pseudoeph-Hydrocodone-Gg Other (See Comments)  . Ivp Dye [Iodinated Diagnostic Agents] Hives  . Sulfa Antibiotics Swelling  . Tape Other (See Comments)    Unknown; has paper thin  skin ; ok to use paper tape      CURRENT MEDICATIONS:  Outpatient Encounter Medications as of 09/13/2020  Medication Sig  . allopurinol (ZYLOPRIM) 100 MG tablet TAKE 1 TABLET BY MOUTH ONCE DAILY  . amoxicillin (AMOXIL) 500 MG tablet Take 4 tablets by mouth as directed. Take 1 hr before dental appt  . apraclonidine (IOPIDINE) 0.5 % ophthalmic solution Place 1 drop into the right eye daily as needed (for droopy eye).   . ASPIRIN LOW DOSE 81 MG chewable tablet Chew 81 mg by mouth daily.   . celecoxib (CELEBREX) 200 MG capsule celecoxib 200 mg capsule  TAKE 1 CAPSULE BY MOUTH EVERY DAY  . Cholecalciferol (VITAMIN D3) 50 MCG (2000 UT) TABS Take 2,000 Units by mouth daily.  . clopidogrel (PLAVIX) 75 MG  tablet Take 1 tablet (75 mg total) by mouth daily.  . colchicine 0.6 MG tablet TAKE 1 TABLET BY MOUTH EVERY DAY OR AS NEEDED  . cyanocobalamin (,VITAMIN B-12,) 1000 MCG/ML injection Inject 1,000 mcg into the muscle every 30 (thirty) days.  Marland Kitchen docusate sodium (COLACE) 100 MG capsule Take 1 capsule (100 mg total) by mouth 2 (two) times daily.  Marland Kitchen ezetimibe (ZETIA) 10 MG tablet TAKE 1 TABLET(10 MG) BY MOUTH DAILY  . fluticasone (FLONASE) 50 MCG/ACT nasal spray Place 1 spray into both nostrils daily.   . furosemide (LASIX) 40 MG tablet Take 40 mg by mouth daily as needed for fluid.   Marland Kitchen lisinopril (ZESTRIL) 10 MG tablet TAKE 1 TABLET(10 MG) BY MOUTH DAILY  . metoprolol succinate (TOPROL-XL) 25 MG 24 hr tablet TAKE 1 TABLET(25 MG) BY MOUTH DAILY  . Multiple Vitamin (MULTI-VITAMIN) tablet Take by mouth.  . nitroGLYCERIN (NITROSTAT) 0.4 MG SL tablet PLACE 1 TABLET UNDER THE TONGUE EVERY 5 MINUTES AS NEEDED FOR CHEST PAIN WITH A MAX OF 3 DOSES  . NONFORMULARY OR COMPOUNDED ITEM Vitamin E vaginal cream 200u/ml.  One ml pv three times weekly.  . pantoprazole (PROTONIX) 40 MG tablet TAKE 1 TABLET(40 MG) BY MOUTH DAILY  . phentermine (ADIPEX-P) 37.5 MG tablet Take 1 tablet by mouth as needed.  . rosuvastatin  (CRESTOR) 20 MG tablet TAKE 1 TABLET(20 MG) BY MOUTH DAILY  . testosterone cypionate (DEPOTESTOSTERONE CYPIONATE) 200 MG/ML injection INJECT 1 ML INTO THE MUSCLE EVERY 28 DAYS. PATIENT IS DUE FOR APPOINTMENT  . venlafaxine XR (EFFEXOR-XR) 150 MG 24 hr capsule TAKE 1 CAPSULE BY MOUTH DAILY WITH BREAKFAST   No facility-administered encounter medications on file as of 09/13/2020.     ONCOLOGIC FAMILY HISTORY:  Family History  Problem Relation Age of Onset  . Breast cancer Mother        possible inflammatory breast cancer  . Heart attack Father   . Heart disease Father   . ALS Sister   . Breast cancer Maternal Grandmother 53  . Heart attack Maternal Grandfather   . Heart attack Paternal Grandfather   . Breast cancer Maternal Aunt        dx in her 67s  . Breast cancer Other        maternal great grandmother; dx in her 38s    GENETIC COUNSELING/TESTING: See above--has tested negative  SOCIAL HISTORY:  Social History   Socioeconomic History  . Marital status: Married    Spouse name: Not on file  . Number of children: 2  . Years of education: Not on file  . Highest education level: Not on file  Occupational History  . Not on file  Tobacco Use  . Smoking status: Former Smoker    Packs/day: 1.00    Years: 20.00    Pack years: 20.00    Start date: 06/09/1992  . Smokeless tobacco: Never Used  . Tobacco comment: quit at age 70  Vaping Use  . Vaping Use: Never used  Substance and Sexual Activity  . Alcohol use: Yes    Alcohol/week: 7.0 - 14.0 standard drinks    Types: 7 - 14 Glasses of wine per week  . Drug use: No    Comment: quit 24 years ago  . Sexual activity: Yes    Birth control/protection: Surgical  Other Topics Concern  . Not on file  Social History Narrative  . Not on file   Social Determinants of Health   Financial Resource Strain: Not  on file  Food Insecurity: Not on file  Transportation Needs: Not on file  Physical Activity: Not on file  Stress: Not on  file  Social Connections: Not on file  Intimate Partner Violence: Not on file      PHYSICAL EXAMINATION:  Vital Signs: There were no vitals filed for this visit. There were no vitals filed for this visit. General: Well-nourished, well-appearing female in no acute distress.  Unaccompanied today.   HEENT: Head is normocephalic.  Pupils equal and reactive to light. Conjunctivae clear without exudate.  Sclerae anicteric. Oral mucosa is pink, moist.  Oropharynx is pink without lesions or erythema.  Lymph: No cervical, supraclavicular, or infraclavicular lymphadenopathy noted on palpation.  Cardiovascular: Regular rate and rhythm.Marland Kitchen Respiratory: Clear to auscultation bilaterally. Chest expansion symmetric; breathing non-labored.  Breast Exam:  Breasts are s/p mastectomy and reconstruction, no sign of local recurrence -Axilla: No axillary adenopathy bilaterally.  GI: Abdomen soft and round; non-tender, non-distended. Bowel sounds normoactive. No hepatosplenomegaly.   GU: Deferred.  Neuro: No focal deficits. Steady gait.  Psych: Mood and affect normal and appropriate for situation.  MSK: No focal spinal tenderness to palpation, full range of motion in bilateral upper extremities Extremities: No edema. Skin: Warm and dry.  LABORATORY DATA:  None for this visit   DIAGNOSTIC IMAGING:  Most recent mammogram: s/p bilateral mastectomies and reconstruction    ASSESSMENT AND PLAN:  Tracey [name] is a pleasant 75 y.o. female with history of Stage IA right breast invasive ductal carcinoma, ER+/PR+/HER2-, diagnosed in 03/2013, treated with bilateral mastectomies, and anti estrogen therapy with aromatase inhibitors x 5 years completed in 04/2018.  [facility] for surveillance and routine follow-up.   1. History of breast cancer:  Tracey [name] is currently clinically and radiographically without evidence of disease or recurrence of breast cancer.  She is doing well and would  like to return here every year for continued surveillance and monitoring which we are happy to accommodate.   I encouraged her to call me with any questions or concerns before her next visit at the cancer center, and I would be happy to see her sooner, if needed.    2. Bone health:  Given Tracey [name] age, history of breast cancer, and her previous anti-estrogen therapy with aromatase inhibitors, she is at risk for bone demineralization. She was recommended to f/u with her PCP about her bone density testing.  She was given education on specific food and activities to promote bone health.  3. Cancer screening:  Due to Tracey [name] history and her age, she should receive screening for skin cancers, colon cancer, and gynecologic cancers. She was encouraged to follow-up with her PCP for appropriate cancer screenings.   4. Health maintenance and wellness promotion: Tracey [name] was encouraged to consume 5-7 servings of fruits and vegetables per day. She was also encouraged to engage in moderate to vigorous exercise for 30 minutes per day most days of the week. She was instructed to limit her alcohol consumption and continue to abstain from tobacco use.    Dispo:  -Return to cancer center in one year for LTS follow up   Total encounter time: 20 minutes*  Wilber Bihari, NP 09/12/20 4:20 PM Medical Oncology and Hematology [facility] [place], [place] [address] Tel. [phone]    Fax. [phone]  *Total Encounter Time as defined by the Centers for Medicare and Medicaid Services includes, in addition to the face-to-face time of a  patient visit (documented in the note above) non-face-to-face time: obtaining and reviewing outside history, ordering and reviewing medications, tests or procedures, care coordination (communications with other health care professionals or caregivers) and documentation in the medical record.   Note: PRIMARY CARE PROVIDER [name],  [place]

## 2020-09-13 ENCOUNTER — Inpatient Hospital Stay: Payer: Medicare Other | Attending: Adult Health | Admitting: Adult Health

## 2020-09-17 DIAGNOSIS — F322 Major depressive disorder, single episode, severe without psychotic features: Secondary | ICD-10-CM | POA: Diagnosis not present

## 2020-09-17 DIAGNOSIS — L659 Nonscarring hair loss, unspecified: Secondary | ICD-10-CM | POA: Diagnosis not present

## 2020-09-17 DIAGNOSIS — I10 Essential (primary) hypertension: Secondary | ICD-10-CM | POA: Diagnosis not present

## 2020-09-17 DIAGNOSIS — F411 Generalized anxiety disorder: Secondary | ICD-10-CM | POA: Diagnosis not present

## 2020-09-17 DIAGNOSIS — N182 Chronic kidney disease, stage 2 (mild): Secondary | ICD-10-CM | POA: Diagnosis not present

## 2020-10-08 DIAGNOSIS — F411 Generalized anxiety disorder: Secondary | ICD-10-CM | POA: Diagnosis not present

## 2020-10-08 DIAGNOSIS — F32A Depression, unspecified: Secondary | ICD-10-CM | POA: Diagnosis not present

## 2020-10-08 DIAGNOSIS — M1990 Unspecified osteoarthritis, unspecified site: Secondary | ICD-10-CM | POA: Diagnosis not present

## 2020-10-08 DIAGNOSIS — F458 Other somatoform disorders: Secondary | ICD-10-CM | POA: Diagnosis not present

## 2020-10-08 DIAGNOSIS — N182 Chronic kidney disease, stage 2 (mild): Secondary | ICD-10-CM | POA: Diagnosis not present

## 2020-10-08 DIAGNOSIS — I10 Essential (primary) hypertension: Secondary | ICD-10-CM | POA: Diagnosis not present

## 2020-10-14 ENCOUNTER — Other Ambulatory Visit: Payer: Self-pay | Admitting: Oncology

## 2020-11-11 DIAGNOSIS — F411 Generalized anxiety disorder: Secondary | ICD-10-CM | POA: Diagnosis not present

## 2020-11-11 DIAGNOSIS — I10 Essential (primary) hypertension: Secondary | ICD-10-CM | POA: Diagnosis not present

## 2020-11-11 DIAGNOSIS — F32A Depression, unspecified: Secondary | ICD-10-CM | POA: Diagnosis not present

## 2020-11-13 DIAGNOSIS — M25562 Pain in left knee: Secondary | ICD-10-CM | POA: Diagnosis not present

## 2020-11-13 DIAGNOSIS — M1712 Unilateral primary osteoarthritis, left knee: Secondary | ICD-10-CM | POA: Diagnosis not present

## 2020-12-09 ENCOUNTER — Encounter: Payer: Self-pay | Admitting: Neurology

## 2020-12-09 ENCOUNTER — Ambulatory Visit (INDEPENDENT_AMBULATORY_CARE_PROVIDER_SITE_OTHER): Payer: Medicare Other | Admitting: Neurology

## 2020-12-09 VITALS — BP 110/70 | HR 63 | Ht 62.0 in | Wt 155.0 lb

## 2020-12-09 DIAGNOSIS — G4763 Sleep related bruxism: Secondary | ICD-10-CM

## 2020-12-09 DIAGNOSIS — R2681 Unsteadiness on feet: Secondary | ICD-10-CM

## 2020-12-09 DIAGNOSIS — G4752 REM sleep behavior disorder: Secondary | ICD-10-CM

## 2020-12-09 DIAGNOSIS — G4731 Primary central sleep apnea: Secondary | ICD-10-CM | POA: Diagnosis not present

## 2020-12-09 MED ORDER — CYCLOBENZAPRINE HCL 10 MG PO TABS: 10.0000 mg | ORAL_TABLET | Freq: Three times a day (TID) | ORAL | 3 refills | Status: DC | PRN

## 2020-12-09 NOTE — Patient Instructions (Signed)
Fall Prevention in the Home, Adult Falls can cause injuries and can happen to people of all ages. There are many things you can do to make your home safe and to help prevent falls. Ask for help when making these changes. What actions can I take to prevent falls? General Instructions  Use good lighting in all rooms. Replace any light bulbs that burn out.  Turn on the lights in dark areas. Use night-lights.  Keep items that you use often in easy-to-reach places. Lower the shelves around your home if needed.  Set up your furniture so you have a clear path. Avoid moving your furniture around.  Do not have throw rugs or other things on the floor that can make you trip.  Avoid walking on wet floors.  If any of your floors are uneven, fix them.  Add color or contrast paint or tape to clearly mark and help you see: ? Grab bars or handrails. ? First and last steps of staircases. ? Where the edge of each step is.  If you use a stepladder: ? Make sure that it is fully opened. Do not climb a closed stepladder. ? Make sure the sides of the stepladder are locked in place. ? Ask someone to hold the stepladder while you use it.  Know where your pets are when moving through your home. What can I do in the bathroom?  Keep the floor dry. Clean up any water on the floor right away.  Remove soap buildup in the tub or shower.  Use nonskid mats or decals on the floor of the tub or shower.  Attach bath mats securely with double-sided, nonslip rug tape.  If you need to sit down in the shower, use a plastic, nonslip stool.  Install grab bars by the toilet and in the tub and shower. Do not use towel bars as grab bars.      What can I do in the bedroom?  Make sure that you have a light by your bed that is easy to reach.  Do not use any sheets or blankets for your bed that hang to the floor.  Have a firm chair with side arms that you can use for support when you get dressed. What can I do in  the kitchen?  Clean up any spills right away.  If you need to reach something above you, use a step stool with a grab bar.  Keep electrical cords out of the way.  Do not use floor polish or wax that makes floors slippery. What can I do with my stairs?  Do not leave any items on the stairs.  Make sure that you have a light switch at the top and the bottom of the stairs.  Make sure that there are handrails on both sides of the stairs. Fix handrails that are broken or loose.  Install nonslip stair treads on all your stairs.  Avoid having throw rugs at the top or bottom of the stairs.  Choose a carpet that does not hide the edge of the steps on the stairs.  Check carpeting to make sure that it is firmly attached to the stairs. Fix carpet that is loose or worn. What can I do on the outside of my home?  Use bright outdoor lighting.  Fix the edges of walkways and driveways and fix any cracks.  Remove anything that might make you trip as you walk through a door, such as a raised step or threshold.  Trim any   bushes or trees on paths to your home.  Check to see if handrails are loose or broken and that both sides of all steps have handrails.  Install guardrails along the edges of any raised decks and porches.  Clear paths of anything that can make you trip, such as tools or rocks.  Have leaves, snow, or ice cleared regularly.  Use sand or salt on paths during winter.  Clean up any spills in your garage right away. This includes grease or oil spills. What other actions can I take?  Wear shoes that: ? Have a low heel. Do not wear high heels. ? Have rubber bottoms. ? Feel good on your feet and fit well. ? Are closed at the toe. Do not wear open-toe sandals.  Use tools that help you move around if needed. These include: ? Canes. ? Walkers. ? Scooters. ? Crutches.  Review your medicines with your doctor. Some medicines can make you feel dizzy. This can increase your chance  of falling. Ask your doctor what else you can do to help prevent falls. Where to find more information  Centers for Disease Control and Prevention, STEAD[employer]: www.cdc.gov  National Institute on Aging: www.nia.nih.gov Contact a doctor if:  You are afraid of falling at home.  You feel weak, drowsy, or dizzy at home.  You fall at home. Summary  There are many simple things that you can do to make your home safe and to help prevent falls.  Ways to make your home safe include removing things that can make you trip and installing grab bars in the bathroom.  Ask for help when making these changes in your home. This information is not intended to replace advice given to you by your health care provider. Make sure you discuss any questions you have with your health care provider. Document Revised: 03/06/2020 Document Reviewed: 03/06/2020 Elsevier Patient Education  2021 [employer]  

## 2020-12-09 NOTE — Progress Notes (Signed)
SLEEP MEDICINE CLINIC   Provider:  Larey Seat, M D  Referring Provider: [name], MD Primary Care Physician:  [name], MD  Chief Complaint  Patient presents with  . Follow-up    RM 10, alone. Followup for cpap. Download from care orchestrator only shows data from [facility]. Pt forgot machine today.  She had to use oxygen while in the [employer], and  exercise. Changed from autopap to cpap pressure setting 13 at last visit. She states she has been using machine nightly.     HPI: Interval history 12-09-2020, needing a new machine , [place]. She has bruxism, and is damaging her teeth, effexor was exchanged to zoloft to prevent some of this. She needs a mouth guard now in daytime- stress symptom? More balance problems- not affecting her golf game. Mostly unsteady on stairs,  Feels she fall s when looking down. Tracey [name] is a 75 y.o. female , seen here in a RV presents today for follow up..  Discussing she is eligible for a new machine 12/2020.  Since the patient has a history of complex sleep apnea and her current AHI is 5.4 without major air leakage and was a device pressure of 90% of the time of 13 cmH2O I would very much like for her to continue using this machine in spite of being on recall.  I ordering a home sleep test to confirm that her current settings are fine with a minimum pressure of 5 maximum pressure of 13 cmH2O to centimeter EPR heated humidity starting pressure is 4 cmH2O ramp time is only 5 minutes.  I reviewed the medication list, the Epworth Sleepiness Scale was endorsed at 5 points while on treatment.  She is not sleeping at night without it and when she spends time at her [facility] home she is usually on oxygen at night but for the first time this winter she was asked to use it when she exercises or even walks in daytime she became so severely short of breath.     06-03-2020:  Tracey [name] is a 75 y.o. female ,  seen here in a RV  rm 11 presents today for follow up. states overall things are well. she asked about a new machine. in discussing she is eligible for a new machine 12/2020. however currently her machine is a respironics and may be a part of a recall as she does use a ozone cleaning device. RN informed her about registrering her device. DME [employer].  I have the pleasure of seeing Tracey [name] today, a longstanding and established patient who has used positive airway pressure therapy for the treatment of complex sleep apnea.  Her sleep apnea complexity is related to underlying causes which we have listed in her past medical history.  She she would be due for new machine as of May 2022 her current C-Flex CPAP is set with a pressure window, does not produce a lot of air leaks, and she is using it 63.3% of the time compliantly.  Actually she has used the machine 26 out of 30 days.  Some nights with use at time is shortened.  She sometimes uses the connection to the machine at night.  Her average AHI is 6.5 which has been a little higher than last year.  She is using a humidifier, she has a ramp time of 5 minutes and starts at 4 cmH2O with a maximum pressure option of 12 cm  of water.  Her 95th percentile pressure used is at 12 cmH2O so she is maxing out on the upper limits.  I would rather until May increase her current pressure by 1 cm and will order a repeat sleep study which can be a home sleep test for her on or about April so that we are ready with a new baseline.   I like to have her tested before she needs the new machine.     Originally as a referral from Dr. Ardeth Perfect -  She fell out of bed, had to get stitches in the ED- she has more and more dream enactment, often nightmares, and not leaving the bed. She had vivd dreams since childhood, and her grandson has night terrors. She also hears music when there is none, but only when a mechanical noise is in the background.  She never had tinnitus, but  a sound as if a radio produces  static sounds was perceived.  On December10th 2019 she had a knee surgery, and the pain medications was giving her horrible delusions- she was terrified. Complex visual hallucinations - these were visions of people, snakes, no voices. People that looked into the window. Delirium.  She called her MD and the police, and her husband was helpfess.   She is OK with her apnea treatment. This year has been quiet , no travels due to Covid 19, and she enjoyed being home - sad only because she missed a family re-union.  Her son died in May 19, 2023- of substance abuse.   Tracey [name] provided me with a high compliance record 93.3% for the last 30 days prior to 29 May 2019.  Average use a time for all days 7 hours 55 minutes, mean pressure of CPAP 9.1 average device pressure 11.4 average time in large leak 17 minutes average AHI 6.4.  As it is a 6.4 is related to the leak time however we do not need to change settings.   First visit upon referral with Dr Ardeth Perfect. Chief complaint according to patient : " re evaluate my apnea and care." The patient reports that she moved to the [place] area about 3 years ago and still gets supplies for her established CPAP per Mail. She was diagnosed with obstructive sleep apnea in [place], in [place]. [facility] the sleep center. The patient was diagnosed on 05/02/2008 the study revealed an AHI of 23 was mild decreases in oxygen level as well as continuous snoring. Then CPAP was used for the second night of the split night polysomnography and her oxygen level improved to normal her snoring resolved and the breathing was totally regulated according to Dr. Shelton Silvas note. Concerning was that there was a ventricular tachycardia as of 27 beats. The patient was evaluated by cardiology afterwards the finding could never be reciprocated. She continued to use CPAP for the last 7 years compliantly but she is a little tired of  using a nasal mask that has become uncomfortable and his pressure marks on her face. She is also not sure to what degree apnea still present at this time. The patient stays about 7 months of the year here in [place], and 5 months in [place]. Usually they travel from Thanksgiving to Easter. Her husband witnesses snoring when she naps, but not while on CPAP. Her insomnia improved drastically after CPAP.   Sleep habits are as follows: The patient goes to bed between 9.30 and 10 PM, usually falls asleep promptly. She prefers the left side  to sleep , but wakes sometimes up on her back.  She learned to sleep on her back after breast cancer surgery ( diagnosed Aug 8th 2014 ). The bedroom is described as core, quiet and dark. The patient shares a bedroom with her husband. They're also 2 dogs in the bedroom on the bed. She rises usually once to go to the bathroom sometimes twice. She can fall asleep again fairly promptly. She is not a restless sleeper not kicking or moving or not excessively at night. She rises in the morning at 5 sometimes even 8. She usually wakes spontaneously not based on an alarm setting. She averages 7-8 hours of nocturnal sleep.  She reports vivid dreams, "repetitive and in full color".  Some mornings she feels refreshed and restored but not all the time. She is still recovering from her breast reconstruction surgery. She endorsed some snoring, joint pain, aching muscles and even some dizziness. She also has right shoulder bursitis which limits her ability to sleep on the right. She naps 3 out of 7 days, once  daily, a nap will last  2 hours unless she sets an alarm.   Tracey [name] this past medical history includes gout, hyperlipidemia, hypertension, palpitations probably due to nonsustained ventricular tachycardia. In addition breast cancer of which her mother maternal aunt maternal grandmother and maternal great-grandmother were also affected. She is a history of allergic rhinitis her  sister died in 2011-11-09 of ALS. Sleep medical history and family sleep history: Her father was diagnosed with OSA never used CPAP ,  had an MI at age 61. Social history: non smoker, quit at age 45. ETOH , 1-2 at night. Caffeine use : none. Decaffeinated tea and coffee. Average and regular golf player. She likes winter sports, especially skiing but uses the left dangerous slope these days.  Interval history from 05-20-15. Hearing has been willing to undergo a new sleep study and attended nocturnal polysomnography on 04-09-15. She was diagnosed with a very mild AHI of 5.2 , supine sleep  AHI became 12.4  and it was further evident that the patient did not get into REM sleep. For this reason I feel it is much more important that the patient stays on CPAP given that her apnea was mild and she was fitted with a new mask. The new mass does not cause pressure marks around her nose and does not entangle her hair. She is using a dream wear interface, which [employer] had to explicitly order for her.  Interval history from 12/23/2015. Tracey [name] has happily returned from [place], I have the pleasure today to see her CPAP download which confirms and 96.7% compliance average user time 6 hours and 39 minutes of CPAP.The device is a C flex set at 8 cm water pressure with a ramp time of 30 minutes. The REM time is a little too long and we will reduce it to 10 minutes. Her husband has made her aware that she still snores when she doesn't use a CPAP. She has become more compliant she endorsed only 2 points today on the geriatric depression store, 7 points on the Epworth sleepiness score and the fatigue severity at 29.  Interval history from 02/26/2016. Tracey [name] is here today relaxed after a week vacation. She reports sleeping so much better with the new CPAP machine. Her sleep is deeper but she does not recall having nightmares she has fallen out of bed 1 night it was actually the third night she used the new CPAP. She  feels  her sleep is less fragmented, more sound, and more refreshing and restorative. Her download revealed 100% compliance over the last 30 days was 97% compliance 4 hours of use, average user time is 7 hours and 41 minutes. This is excellent compliance with CPAP is set at 8 cm water we did  use a 10 minute ramp function. Her snoring has of course been alleviated using CPAP, which pleases her husband. She has a residual AHI of 8.1 but given her decrease and sleepiness/ fatigue I would not want to change the settings.  Interval history from 12/21/2016, Tracey [name] has been to winter in [place] and had a additional medication in [place]. She is now doing well but during her winter stay in [place] she developed shortness of breath and apparently more severe hypoxemia and apnea. She saw a pulmonologist at the location would tested her oxygen levels while walking and also diagnosed her with type a flu. She had been coughing for over 6 weeks when the flu finally converted to bronchitis and then resolved. She remained air hungry and short of breath for much of her winter time. I'm able to state today that she feels back to her baseline. But we need to discuss how we could prevent altitude related apneas which can often be central in nature and if she should use an AutoPap with an automatic pressure window. The patient is a very highly compliant CPAP user her compliance rate was 93.3% with an average user time of 7 hours and 6 minutes. She has some air leaks but not significant once CPAP is set at 8 cm water pressure with a residual AHI of 17.4. She has a short ramp time. Most of the AHI is related to shallow breathing. Her average central apnea index is 1.1 and obstructive apnea index is 3.3. Our goal is is to have an AHI of below 5. I will ask [employer] to change her to an auto titration from 5-10 cm water. She continues to use a dream wear interface. She would like the non-prong version of the dream wear interface. We will  also meet again in late September or October to discuss if she needs additional oxygen at night during her stays at high altitudes.  05-24-2017, Tracey [name] is planning again to spend the winter in [place]. She asked me to make sure that she will have oxygen available for nightly use in the upper inner air. Last year she stated she was miserable on CPAP alone. She is a very compliant CPAP use of his 100% compliance and an average user time of 9 hours and 2 minutes, uses the machine as an auto CPAP between 5 and 15 cm water, average pressure is 9.9 cm water and the average residual AHI is 5.1. The machine allows between 5 and 10 cm water pressure only, given that the patient does not have central apneas I will increase the upper limits to 12 cm water. I would also investigate is able care can help [place] with oxygen in [place] ( she knows an oxygen supplier in [place]- "[employer]" ) .   05-30-2018, The patient suffered a heart attack in January of this year, and had a rough year- needs bilateral knee replacements. Her step- son died 108 days ago at age 87 years , with alcoholism and bipolar disease, died of multi organ -liver failure. She brought her CPAP machine - and would like to change her interface .she wants to return to a pillairo or nasal  pillow. Her machine was issued in  August 2016- after a AHI of only 5.2- but loud snoring and RERAS. UARS. Residual AHI was 7/h at 90% pressure of 10.2 cm water.  Compliance was 87% for time, and 26/ 30 days.    Review of Systems: Out of a complete 14 system review, the patient complains of only the following symptoms, and all other reviewed systems are negative.Snoring. Grieving - lost mother and sister to breast cancer in a short time and was diagnosed with breast cancer herself 3.5 years ago.  On antihormone therapy. She takes statins.  Epworth score 7/24  , Fatigue severity score again at  41/ 63, geriatric depression score 2 ,     her  machine is 5.76  years old .  AHI increased . Nasal pillow- dream wear causes hair trouble- will switch back to a nasal pillow.  Settings are AUTO on a dream station, settings are at 5-12 with 2 cm water EPR. I increased to 13 cm.   How likely are you to doze in the following situations: 0 = not likely, 1 = slight chance, 2 = moderate chance, 3 = high chance  Sitting and Reading? Watching Television? Sitting inactive in a public place (theater or meeting)? Lying down in the afternoon when circumstances permit? Sitting and talking to someone? Sitting quietly after lunch without alcohol? In a car, while stopped for a few minutes in traffic? As a passenger in a car for an hour without a break?  Total = 5 on auto PAP.   Bruxism  Shingles last winter, left neck and shoulder.     Social History   Socioeconomic History  . Marital status: Married    Spouse name: Not on file  . Number of children: 2  . Years of education: Not on file  . Highest education level: Not on file  Occupational History  . Not on file  Tobacco Use  . Smoking status: Former Smoker    Packs/day: 1.00    Years: 20.00    Pack years: 20.00    Start date: 06/09/1992  . Smokeless tobacco: Never Used  . Tobacco comment: quit at age 74  Vaping Use  . Vaping Use: Never used  Substance and Sexual Activity  . Alcohol use: Yes    Alcohol/week: 7.0 - 14.0 standard drinks    Types: 7 - 14 Glasses of wine per week  . Drug use: No    Comment: quit 24 years ago  . Sexual activity: Yes    Birth control/protection: Surgical  Other Topics Concern  . Not on file  Social History Narrative  . Not on file   Social Determinants of Health   Financial Resource Strain: Not on file  Food Insecurity: Not on file  Transportation Needs: Not on file  Physical Activity: Not on file  Stress: Not on file  Social Connections: Not on file  Intimate Partner Violence: Not on file    Family History  Problem Relation Age of  Onset  . Breast cancer Mother        possible inflammatory breast cancer  . Heart attack Father   . Heart disease Father   . ALS Sister   . Breast cancer Maternal Grandmother 109  . Heart attack Maternal Grandfather   . Heart attack Paternal Grandfather   . Breast cancer Maternal Aunt        dx in her 20s  . Breast cancer Other  maternal great grandmother; dx in her 14s    Past Medical History:  Diagnosis Date  . Allergy   . Anxiety    takes Ativan daily prn  . Arthritis   . Breast cancer ([place]) 2014   ER+/PR+/HEr2-,   . Bruises easily   . Colon polyps    2 polyps by report  . Diverticulosis   . GERD (gastroesophageal reflux disease)    occasionally takes Nexium   . Gout    takes Allopurinol daily and Colchicine daily prn;last attack 48yr ago  . Gout   . History of bladder infections    many yrs ago  . History of bronchitis    last time at least 89yrago  . History of stress incontinence   . Hyperlipidemia    takes Pravastatin daily  . Insomnia    takes Ambien nightly prn  . NSTEMI (non-ST elevated [place] infarction) [place]  . OSA (obstructive sleep apnea)    on CPAP  . Osteoarthritis   . Peripheral edema    takes Furosemide daily prn  . PONV (postoperative nausea and vomiting)   . Postmenopausal hormone therapy   . Radiation 07/27/13-09/07/13   Right Breast x 31 treatments  . Rhinitis    uses Flonase prn  . Sinus congestion   . Status post breast reconstruction 10/15/14   [place]  . Tachycardia    takes Metoprolol daily  . Ventricular tachycardia, non-sustained ([place]   during sleep study 2009 with normal cardiac workup    Past Surgical History:  Procedure Laterality Date  . ABDOMINAL HYSTERECTOMY     with BSO  . APPENDECTOMY    . AXILLARY SENTINEL NODE BIOPSY Right 05/23/2013   Procedure: AXILLARY SENTINEL NODE BIOPSY;  Surgeon: ToOdis HollingsheadMD;  [place] [facility]  General;  Laterality: Right;  nuc med injection 7:00  . BREAST RECONSTRUCTION WITH PLACEMENT OF TISSUE EXPANDER AND FLEX HD (ACELLULAR HYDRATED DERMIS) Bilateral 05/23/2013   Procedure: BILATERAL BREAST RECONSTRUCTION WITH PLACEMENT OF TISSUE EXPANDER AND FLEX HD;  Surgeon: DaCrissie ReeseMD;  [place] Service: Plastics;  Laterality: Bilateral;  . CARDIAC CATHETERIZATION  1999  . CATARACT EXTRACTION, BILATERAL Bilateral   . CHOLECYSTECTOMY    . COSMETIC SURGERY    . OTHER SURGICAL HISTORY  09/2017   had cyst removal from spine   . PERCUTANEOUS CORONARY STENT INTERVENTION (PCI-S)  [place]   [place]  . REMOVAL OF BILATERAL TISSUE EXPANDERS WITH PLACEMENT OF BILATERAL BREAST IMPLANTS Bilateral 05/17/2014   Procedure: REMOVAL OF BILATERAL TISSUE EXPANDERS WITH PLACEMENT OF BILATERAL BREAST IMPLANTS FOR RECONSTRUCTION;  Surgeon: DaCrissie ReeseMD;  [place] [facility] Service: Plastics;  Laterality: Bilateral;  . TONSILLECTOMY    . TOTAL KNEE ARTHROPLASTY Right 07/26/2018   Procedure: TOTAL KNEE ARTHROPLASTY;  Surgeon: OlParalee CancelMD;  Location: [facility];  Service: Orthopedics;  Laterality: Right;  70 mins  . TOTAL MASTECTOMY Bilateral 05/23/2013   Procedure: TOTAL MASTECTOMY;  Surgeon: ToOdis HollingsheadMD;  [facility]: General;  Laterality: Bilateral;    Current Outpatient Medications  Medication Sig Dispense Refill  . allopurinol (ZYLOPRIM) 100 MG tablet TAKE 1 TABLET BY MOUTH ONCE DAILY 90 tablet 0  . amoxicillin (AMOXIL) 500 MG tablet Take 4 tablets by mouth as directed. Take 1 hr before dental appt    . apraclonidine (IOPIDINE) 0.5 % ophthalmic solution Place 1 drop  into the right eye daily as needed (for droopy eye).   2  . ASPIRIN LOW DOSE 81 MG chewable tablet Chew 81 mg by mouth daily.   0  . celecoxib (CELEBREX) 200 MG capsule celecoxib 200 mg capsule  TAKE 1 CAPSULE BY MOUTH EVERY DAY    . Cholecalciferol (VITAMIN D3) 50 MCG (2000 UT) TABS Take 2,000  Units by mouth daily.    . clopidogrel (PLAVIX) 75 MG tablet Take 1 tablet (75 mg total) by mouth daily. 90 tablet 3  . colchicine 0.6 MG tablet TAKE 1 TABLET BY MOUTH EVERY DAY OR AS NEEDED 30 tablet 0  . cyanocobalamin (,VITAMIN B-12,) 1000 MCG/ML injection Inject 1,000 mcg into the muscle every 30 (thirty) days.    Marland Kitchen docusate sodium (COLACE) 100 MG capsule Take 1 capsule (100 mg total) by mouth 2 (two) times daily. 10 capsule 0  . ezetimibe (ZETIA) 10 MG tablet TAKE 1 TABLET(10 MG) BY MOUTH DAILY 90 tablet 3  . fluticasone (FLONASE) 50 MCG/ACT nasal spray Place 1 spray into both nostrils daily.    . furosemide (LASIX) 40 MG tablet Take 40 mg by mouth daily as needed for fluid.     Marland Kitchen lisinopril (ZESTRIL) 10 MG tablet TAKE 1 TABLET(10 MG) BY MOUTH DAILY 90 tablet 3  . metoprolol succinate (TOPROL-XL) 25 MG 24 hr tablet TAKE 1 TABLET(25 MG) BY MOUTH DAILY 90 tablet 3  . Multiple Vitamin (MULTI-VITAMIN) tablet Take by mouth.    . nitroGLYCERIN (NITROSTAT) 0.4 MG SL tablet PLACE 1 TABLET UNDER THE TONGUE EVERY 5 MINUTES AS NEEDED FOR CHEST PAIN WITH A MAX OF 3 DOSES 25 tablet 1  . NONFORMULARY OR COMPOUNDED ITEM Vitamin E vaginal cream 200u/ml.  One ml pv three times weekly. 36 each 1  . pantoprazole (PROTONIX) 40 MG tablet TAKE 1 TABLET(40 MG) BY MOUTH DAILY 90 tablet 2  . phentermine (ADIPEX-P) 37.5 MG tablet Take 1 tablet by mouth as needed.    . rosuvastatin (CRESTOR) 20 MG tablet TAKE 1 TABLET(20 MG) BY MOUTH DAILY 90 tablet 2  . sertraline (ZOLOFT) 100 MG tablet Take 1 tablet by mouth daily.    Marland Kitchen testosterone cypionate (DEPOTESTOSTERONE CYPIONATE) 200 MG/ML injection INJECT 1 ML INTO THE MUSCLE EVERY 28 DAYS. PATIENT IS DUE FOR APPOINTMENT     No current facility-administered medications for this visit.    Allergies as of 12/09/2020 - Review Complete 12/09/2020  Allergen Reaction Noted  . Pseudoeph-hydrocodone-gg Other (See Comments) 02/03/2019  . Ivp dye [iodinated diagnostic agents]  Hives 06/09/2012  . Sulfa antibiotics Swelling 06/09/2012  . Tape Other (See Comments) 07/04/2018    Vitals: BP 110/70 (BP Location: Left Arm, Patient Position: Sitting, Cuff Size: Normal)   Pulse 63   Ht 5' 2"  (1.575 m)   Wt 155 lb (70.3 kg)   LMP 08/17/1977 (Approximate)   SpO2 97%   BMI 28.35 kg/m  Last Weight:  Wt Readings from Last 1 Encounters:  12/09/20 155 lb (70.3 kg)   XJO:ITGP mass index is 28.35 kg/m.     Last Height:   Ht Readings from Last 1 Encounters:  12/09/20 5' 2"  (1.575 m)    Physical exam: hoarse voice. Looking well and well groomed.  No loss of smell or taste- never had covid 19, had 2 vaccines and a booster.  General: The patient is awake, alert and appears not in acute distress.  The patient is well groomed. She reports sweating a lot.  Head: Normocephalic, atraumatic. Neck  is supple. Mallampati 4,  neck circumference: 16.25  Nasal airflow unrestricted, TMJ is now evident. Retrognathia is not seen.  She has a crowded lower jaw and she has bruxism marks. Wore a dental device. . Cardiovascular:  Regular rate and rhythm , without  murmurs or carotid bruit, and without distended neck veins. Respiratory: Lungs are clear to auscultation. Skin:  Without evidence of edema, or rashTrunk: BMI is 28. The patient's posture is erect.   Neurologic exam :Speech is fluent,  without dysarthria, mild dysphonia but no aphasia.  Mood and affect are appropriate.  Cranial nerves:Pupils are equal and briskly reactive to light.  Extraocular movement are intact. Hearing to finger rub intact.  Facial sensation intact to fine touch. Facial motor strength is symmetric and tongue and uvula moves midline.  Shoulder shrug was symmetrical.  Muscle tone smooth, but I feel no cogwheeling, bilaterally mild tremor, no ataxia.  Normal finger to nose.  her gait is affected, she walks unsteady and drifts to left or right and when she nbends over she feels that she will fall.    Assessment and Plan:   1) complex sleep apnea with oxygen needs in high altitude, she needs a new machine but we need to test first- does she need oxygen here in [place] too? Will ask for HST or attended sleep study, consider CPAP versus BiPAP.   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 Started flexaril 10 mg at night time.   3) unsteady gait- evaluation with PT. REM BD, 4 cousins with PD one sister died of ALS. No tremor -Now some mild cogwheeling, may need balance and gait PT>    The patient was advised of the nature of the diagnosed sleep disorder , the treatment options and risks for general a health and wellness arising from not treating the condition.   I spent more than 30 minutes of face to face time with the patient. Greater than 50% of time was spent in counseling and coordination of care. We have discussed the diagnosis and differential and I answered the patient's questions.    6 month RV  with gait examination from now on.     Asencion Partridge Kaileen Bronkema MD  12/09/2020   CC: [name], [place],   [place] [id number]

## 2020-12-30 ENCOUNTER — Other Ambulatory Visit: Payer: Self-pay

## 2020-12-30 ENCOUNTER — Ambulatory Visit: Payer: Medicare Other | Attending: Neurology | Admitting: Physical Therapy

## 2020-12-30 DIAGNOSIS — Z23 Encounter for immunization: Secondary | ICD-10-CM | POA: Diagnosis not present

## 2020-12-30 DIAGNOSIS — R2689 Other abnormalities of gait and mobility: Secondary | ICD-10-CM | POA: Diagnosis not present

## 2020-12-30 NOTE — Therapy (Signed)
[place], [place], [address] Phone: [phone]   Fax:  [phone]  Physical Therapy Evaluation  Patient Details  Name: Tracey [name] MRN: [id number] Date of Birth: [birth year] Referring Provider (PT): Dr. Asencion Partridge Dohmeier   Encounter Date: 12/30/2020   PT End of Session - 12/30/20 1117    Visit Number 1    Number of Visits 16    Date for PT Re-Evaluation 02/24/21    PT Start Time 1050    PT Stop Time 1138    PT Time Calculation (min) 48 min    Activity Tolerance Patient tolerated treatment well    Behavior During Therapy [facility] for tasks assessed/performed           Past Medical History:  Diagnosis Date  . Allergy   . Anxiety    takes Ativan daily prn  . Arthritis   . Breast cancer ([place]) 2014   ER+/PR+/HEr2-,   . Bruises easily   . Colon polyps    2 polyps by report  . Diverticulosis   . GERD (gastroesophageal reflux disease)    occasionally takes Nexium   . Gout    takes Allopurinol daily and Colchicine daily prn;last attack 100yr ago  . Gout   . History of bladder infections    many yrs ago  . History of bronchitis    last time at least 855yrago  . History of stress incontinence   . Hyperlipidemia    takes Pravastatin daily  . Insomnia    takes Ambien nightly prn  . NSTEMI (non-ST elevated myocardial infarction) ([facility]01/2019  . OSA (obstructive sleep apnea)    on CPAP  . Osteoarthritis   . Peripheral edema    takes Furosemide daily prn  . PONV (postoperative nausea and vomiting)   . Postmenopausal hormone therapy   . Radiation 07/27/13-09/07/13   Right Breast x 31 treatments  . Rhinitis    uses Flonase prn  . Sinus congestion   . Status post breast reconstruction 10/15/14   [place]  . Tachycardia    takes Metoprolol daily  . Ventricular tachycardia, non-sustained ([place]   during sleep study 2009 with normal cardiac workup     Past Surgical History:  Procedure Laterality Date  . ABDOMINAL HYSTERECTOMY     with BSO  . APPENDECTOMY    . AXILLARY SENTINEL NODE BIOPSY Right 05/23/2013   Procedure: AXILLARY SENTINEL NODE BIOPSY;  Surgeon: ToOdis HollingsheadMD;  [facility] Service: General;  Laterality: Right;  nuc med injection 7:00  . BREAST RECONSTRUCTION WITH PLACEMENT OF TISSUE EXPANDER AND FLEX HD (ACELLULAR HYDRATED DERMIS) Bilateral 05/23/2013   Procedure: BILATERAL BREAST RECONSTRUCTION WITH PLACEMENT OF TISSUE EXPANDER AND FLEX HD;  Surgeon: DaCrissie ReeseMD;  [place] Service: Plastics;  Laterality: Bilateral;  . CARDIAC CATHETERIZATION  1999  . CATARACT EXTRACTION, BILATERAL Bilateral   . CHOLECYSTECTOMY    . COSMETIC SURGERY    . OTHER SURGICAL HISTORY  09/2017   had cyst removal from spine   . PERCUTANEOUS CORONARY STENT INTERVENTION (PCI-S)  08/2017   [place]  . REMOVAL OF BILATERAL TISSUE EXPANDERS WITH PLACEMENT OF BILATERAL BREAST IMPLANTS Bilateral 05/17/2014   Procedure: REMOVAL OF BILATERAL TISSUE EXPANDERS WITH PLACEMENT OF BILATERAL BREAST IMPLANTS FOR RECONSTRUCTION;  Surgeon: DaCrissie ReeseMD;  [place] [facility] Service: Plastics;  Laterality: Bilateral;  . TONSILLECTOMY    .  TOTAL KNEE ARTHROPLASTY Right 07/26/2018   Procedure: TOTAL KNEE ARTHROPLASTY;  Surgeon: Paralee Cancel, MD;  Location: [facility];  Service: Orthopedics;  Laterality: Right;  70 mins  . TOTAL MASTECTOMY Bilateral 05/23/2013   Procedure: TOTAL MASTECTOMY;  Surgeon: [name], MD;  Location: [place];  Service: General;  Laterality: Bilateral;    There were no vitals filed for this visit.    Subjective Assessment - 12/30/20 1054    Subjective I lose my balance easily.  If I lean forward I lose it.  I walk and kind of lean to the side. I think it is something in my eyes and ears. Its different than dizzy just off balance. Denies pain .  She uses no assistive device unless it is icy. [place] out  of bed and hit her head on the night stand (18 mos ago). Uses O2 at home [facility] [place] [facility] but [place]    Pertinent History Breast Cancer 2014, MI, Rt TKA    Limitations Walking    Patient Stated Goals Patient wants to feel more steady.  I dont know what can be done.    Currently in Pain? No/denies              [facility] - 12/30/20 0001      Assessment   Medical Diagnosis unsteady gait    Referring Provider (PT) Dr. CAsencion PartridgeDohmeier    Prior Therapy Yes, knees      Precautions   Precautions None      Restrictions   Weight Bearing Restrictions No      Balance Screen   Has the patient fallen in the past 6 months No   I come real close   How many times? 18 mos ago    Has the patient had a decrease in activity level because of a fear of falling?  Yes    Is the patient reluctant to leave their home because of a fear of falling?  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, education    Leisure gardening, golf      Cognition   Overall Cognitive Status Within Functional Limits for tasks assessed      Observation/Other Assessments   Focus on Therapeutic Outcomes (FOTO)  49%      Sensation   Light Touch Appears Intact      Coordination   Gross Motor Movements are Fluid and Coordinated Not tested      Posture/Postural Control   Posture/Postural Control Postural limitations    Postural Limitations Rounded Shoulders;Forward head;Increased thoracic kyphosis      Strength   Overall Strength  Comments WFL in seated MMT      Transfers   Five time sit to stand comments  19 sec , then 7 reps done in 30 sec    Comments mild LOB posteriorly x 3-4      Ambulation/Gait   Ambulation Distance (Feet) 175 Feet  Gait Pattern Step-through pattern;Scissoring;Ataxic    Gait Comments mild LOB with distraction, head turns, poor focus      Berg Balance Test   Sit to Stand Able to stand without using hands and stabilize independently    Standing Unsupported Able to stand safely 2 minutes    Sitting with Back Unsupported but Feet Supported on Floor or Stool Able to sit safely and securely 2 minutes    Stand to Sit Sits safely with minimal use of hands    Transfers Able to transfer safely, minor use of hands    Standing Unsupported with Eyes Closed Able to stand 10 seconds safely    Standing Unsupported with Feet Together Able to place feet together independently and stand for 1 minute with supervision    From Standing, Reach Forward with Outstretched Arm Can reach forward >12 cm safely (5")   mild LOB when reaching to 12 inches   From Standing Position, Pick up Object from Floor Able to pick up shoe safely and easily    From Standing Position, Turn to Look Behind Over each Shoulder Needs supervision when turning   LOB to Rt   Turn 360 Degrees Able to turn 360 degrees safely one side only in 4 seconds or less    Standing Unsupported, Alternately Place Feet on Step/Stool Able to stand independently and complete 8 steps >20 seconds    Standing Unsupported, One Foot in [employer] balance while stepping or standing    Standing on One Leg Able to lift leg independently and hold equal to or more than 3 seconds    Total Score 43    Berg comment: rec. cane full time to patient      Dynamic Gait Index   Level Surface Mild Impairment    Change in Gait Speed Mild Impairment    Gait with Horizontal Head Turns Moderate Impairment    Gait with Vertical Head Turns Moderate Impairment    DGI comment: did  not complete      High Level Balance   High Level Balance Activites Head turns;Turns    High Level Balance Comments needs mIn A for balance recovery             Objective measurements completed on examination: See above findings.               PT Education - 12/30/20 1336    Education Details PT/POC, PT diagnosis, possible causes of gait instability, balance strategies    Person(s) Educated Patient    Methods Explanation;Demonstration    Comprehension Verbalized understanding;Returned demonstration            PT Short Term Goals - 12/30/20 1337      PT SHORT TERM GOAL #1   Title Pt will be able to be full screened for balance and fall risk, goal(s) set    Time 4    Period Weeks    Status New    Target Date 01/27/21      PT SHORT TERM GOAL #2   Title Pt will be agreeable to walking with cane when in the community to reduce fall risk    Time 4    Period Weeks    Status New    Target Date 01/27/21      PT SHORT TERM GOAL #3   Title Pt will be I with HEP for balance , fall recovery    Time 4    Period Weeks    Status New  Target Date 01/27/21                     Plan - 12/30/20 1253    Clinical Impression Statement Patient presetns to PT eval today for mod complexity eval of unsteady gait and gait abnormality which has been progressing for the past 1-2 years. She demonstrates normal LE strength but overall decreased functional mobility due to balance deficits.  She scored 49% on FOTO and 43/56 on Berg.  Her DGI was not completed but it was identified that head turns and visual scanning increases instability. She tends to sway to the L and posterior when negotiating obstacles. She was unable to right herself after a forward reach of 10 -12 inches.  She is not dizzy but lacks coordination to control her dynamic balance.  She will be seen at our neurorehab clninc afor max benefit but may need to start PT here at our [facility] location to address  deficits ASAP.    Personal Factors and Comorbidities Comorbidity 3+    Comorbidities MI, Rt TKA, cancer    Examination-Activity Limitations Locomotion Level;Stand    Examination-Participation Restrictions Cleaning;Shop;Community Activity;Yard Work;Interpersonal Relationship    Stability/Clinical Decision Making Evolving/Moderate complexity    Clinical Decision Making Moderate    Rehab Potential Excellent    PT Frequency 2x / week    PT Duration 8 weeks    PT Treatment/Interventions ADLs/Self Care Home Management;Therapeutic activities;Patient/family education;Balance training;Gait training;Neuromuscular re-education;DME Instruction;Therapeutic exercise;Vestibular    PT Next Visit Plan HEP, finish DGI and TUG, set long term goals    PT Home Exercise Plan not given on eval    Recommended Other Services Neuro    Consulted and Agree with Plan of Care Patient           Patient will benefit from skilled therapeutic intervention in order to improve the following deficits and impairments:  Abnormal gait,Decreased safety awareness,Decreased endurance,Decreased mobility,Decreased balance  Visit Diagnosis: Other abnormalities of gait and mobility     Problem List Patient Active Problem List   Diagnosis Date Noted  . Complaint related to dreams 06/05/2019  . Metabolic acidosis with increased anion gap and accumulation of organic acids 08/17/2018  . Nausea, vomiting, and diarrhea 08/17/2018  . AKI (acute kidney injury) ([place]) 08/17/2018  . Overweight (BMI 25.0-29.9) 07/27/2018  . 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/27/2018  . S/P right TKA 07/26/2018  . S/P knee replacement 07/26/2018  . UARS (upper airway resistance syndrome) 05/30/2018  . Hyperlipidemia 05/02/2018  . Leg injury, right, initial encounter 01/06/2017  . Contusion of right lower leg 01/06/2017  . Hypoxemia 12/23/2015  . Statin myopathy 05/20/2015  . MCI (mild cognitive impairment) 05/20/2015  . Edema 05/17/2015   . 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 Breast cancer genetic susceptibility 04/02/2015  . OSA on CPAP 04/02/2015  . CAD (coronary artery disease), native coronary artery 01/01/2014  . Chest pain 01/01/2014  . Hypertension   . Ventricular tachycardia, non-sustained ([place])   . Breast cancer of upper-outer quadrant of right female breast ([place]) 06/28/2013  . Postoperative visit 06/02/2013  . Gout   . Tachycardia     Cayleb Jarnigan 12/30/2020, 1:48 PM  [facility] [address] [place], [place], [address] Phone: [phone]   Fax:  [phone]  Name: Tracey [name] MRN: [id number] Date of Birth: 09/10/1945  Raeford Razor, PT 12/30/20 1:49 PM Phone: [phone] Fax: [phone]

## 2021-01-01 ENCOUNTER — Telehealth: Payer: Self-pay | Admitting: Neurology

## 2021-01-01 NOTE — Telephone Encounter (Signed)
Pt said she got the CPAP in the mail a few days ago and it is working, she'd like to know If her readings are coming thru. please call

## 2021-01-01 NOTE — Telephone Encounter (Signed)
Pt called back and the message was relayed to her, this is [facility]

## 2021-01-01 NOTE — Telephone Encounter (Signed)
Called the patient.  There was no answer.  Left a detailed message advising that I do have the information I need from her new machine.  Advised if she had any more questions to give me a call.  **If pt calls back please make sure that she understood that we do have the new machine listed in our system and the data is pulling over.

## 2021-01-09 ENCOUNTER — Other Ambulatory Visit: Payer: Self-pay

## 2021-01-09 ENCOUNTER — Ambulatory Visit (INDEPENDENT_AMBULATORY_CARE_PROVIDER_SITE_OTHER): Payer: Medicare Other | Admitting: Neurology

## 2021-01-09 DIAGNOSIS — G4763 Sleep related bruxism: Secondary | ICD-10-CM

## 2021-01-09 DIAGNOSIS — G4752 REM sleep behavior disorder: Secondary | ICD-10-CM

## 2021-01-09 DIAGNOSIS — Z9981 Dependence on supplemental oxygen: Secondary | ICD-10-CM

## 2021-01-09 DIAGNOSIS — R2681 Unsteadiness on feet: Secondary | ICD-10-CM

## 2021-01-09 DIAGNOSIS — G4731 Primary central sleep apnea: Secondary | ICD-10-CM | POA: Diagnosis not present

## 2021-01-09 DIAGNOSIS — R4189 Other symptoms and signs involving cognitive functions and awareness: Secondary | ICD-10-CM

## 2021-01-09 DIAGNOSIS — Z9989 Dependence on other enabling machines and devices: Secondary | ICD-10-CM

## 2021-01-16 DIAGNOSIS — D692 Other nonthrombocytopenic purpura: Secondary | ICD-10-CM | POA: Diagnosis not present

## 2021-01-16 DIAGNOSIS — L649 Androgenic alopecia, unspecified: Secondary | ICD-10-CM | POA: Diagnosis not present

## 2021-01-16 DIAGNOSIS — R300 Dysuria: Secondary | ICD-10-CM | POA: Diagnosis not present

## 2021-01-16 DIAGNOSIS — L218 Other seborrheic dermatitis: Secondary | ICD-10-CM | POA: Diagnosis not present

## 2021-01-20 DIAGNOSIS — Z9989 Dependence on other enabling machines and devices: Secondary | ICD-10-CM | POA: Insufficient documentation

## 2021-01-20 DIAGNOSIS — G4763 Sleep related bruxism: Secondary | ICD-10-CM | POA: Insufficient documentation

## 2021-01-20 DIAGNOSIS — R2681 Unsteadiness on feet: Secondary | ICD-10-CM | POA: Insufficient documentation

## 2021-01-20 DIAGNOSIS — Z9981 Dependence on supplemental oxygen: Secondary | ICD-10-CM | POA: Insufficient documentation

## 2021-01-20 NOTE — Progress Notes (Signed)
POLYSOMNOGRAPHY IMPRESSION:   1. SEVERE and Complex Sleep Apnea (CSA) was treated with CPAP in this CPAP dependent patient, [place]  2. Fragmented sleep, many spontaneous arousals from sleep 3. Many PVCs in single channel EKG.  4. NO REM sleep was recorded.  5. No additional  oxygen was needed for sleep at this altitude.   RECOMMENDATIONS: Continue CPAP use with autotitration device at a range from 5 cm water  through 10 cm water , 1 cm EPR, under heated humidification and nasal N 20 mask in small size.    A follow up appointment will be scheduled in the Sleep Clinic at [facility].      I certify that I have reviewed the entire raw data recording prior to the issuance of this report in accordance with the Standards of Accreditation of the American Academy of Sleep Medicine (AASM)    Larey Seat, M.D. Diplomat, Tax adviser of Psychiatry and Neurology  Diplomat, Tax adviser of Sleep Medicine Market researcher, [employer] [facility] at [place]: 01/09/21 21:00

## 2021-01-20 NOTE — Addendum Note (Signed)
Addended by: Larey Seat on: 01/20/2021 05:26 PM   Modules accepted: Orders

## 2021-01-20 NOTE — Procedures (Signed)
PATIENT'S NAME:  Tracey [name], Tracey [name] DOB:      [birth year]      MR#:    [id number]     DATE OF RECORDING: 01/09/2021 REFERRING M.D.:  [name], MD Study Performed:  Split-Night Titration Study HISTORY:  Interval history 12-09-2020, Tracey [name] lives half of the year in the [facility] and half here, in [place].  She is needing a new machine and [place]. She has bruxism, and is damaging her teeth, Effexor was exchanged to Zoloft to prevent some of this.  More balance problems- not affecting her golf game. Mostly unsteady on stairs  and feels she will fall when looking down. Tracey [name] is a 75 y.o. female- she is eligible for a new machine 12/2020.   Since the patient has a history of complex sleep apnea and her current AHI is 5.4 without major air leakage and with a device pressure of 90% of the time of 13 cmH2O, I would very much like for her to continue using this machine in spite of being on recall. I ordering a home sleep test to confirm that her current settings are fine with a minimum pressure of 5 maximum pressure of 13 cmH2O to centimeter EPR heated humidity starting pressure is 4 cmH2O ramp time is only 5 minutes.    She is not sleeping at night without it and when she spends time at her [facility] home she is usually on oxygen at night but for the first time this winter she was asked to use it when she exercises or even walks in daytime she became so severely short of breath.  The patient endorsed the Epworth Sleepiness Scale at 5 points   The patient's weight 155 pounds with a height of 62 (inches), resulting in a BMI of 28.4 kg/m2. The patient's neck circumference measured 16 inches.  CURRENT MEDICATIONS: Zyloprim, Amoxil, Iopidine, Celebrex, Vitamin D3, Plavix, Vitamin b-12, Colace, Zetia, Flonase, Lasix, Zestril, Toprol-XL, Multi-Vitamin, Nitrostat, Protonix, Adipex-P, Crestor, Zoloft, Depot-testosterone Cypionate    PROCEDURE:  This is a  multichannel digital polysomnogram utilizing the Somnostar 11.2 system.  Electrodes and sensors were applied and monitored per AASM Specifications.   EEG, EOG, Chin and Limb EMG, were sampled at 200 Hz.  ECG, Snore and Nasal Pressure, Thermal Airflow, Respiratory Effort, CPAP Flow and Pressure, Oximetry was sampled at 50 Hz. Digital video and audio were recorded.      BASELINE STUDY WITHOUT CPAP RESULTS:Lights Out was at 22:09 and Lights On at 05:22.  Total recording time (TRT) was 55.5, with a total sleep time (TST) of 16.5 minutes.   The patient's sleep latency was 34.5 minutes.  REM latency was 0 minutes.  The sleep efficiency was 29.7 %. The patient is CPAP depednet and needed CPAP to go to sleep-     SLEEP ARCHITECTURE: WASO (Wake after sleep onset) was 1.5 minutes, Stage N1 was 9.5 minutes, Stage N2 was 7 minutes, Stage N3 was 0 minutes and Stage R (REM sleep) was 0 minutes.  The percentages were Stage N1 57.6%, Stage N2 42.4%, Stage N3 0% and Stage R (REM sleep) 0%.   RESPIRATORY ANALYSIS:  There were a total of 15 respiratory events:  0 obstructive apneas, 5 central apneas and 0 mixed apneas with a total of 5 apneas and an apnea index (AI) of 18.2. There were 10 hypopneas with a hypopnea index of 36.4. The patient also had 0 respiratory event related arousals (RERAs).  The total APNEA/HYPOPNEA INDEX (AHI) was 54.5 /hour and the total RESPIRATORY DISTURBANCE INDEX was 54.5 /hour.  0 events occurred in REM sleep and 25 events in NREM. The REM AHI was 0 /hour versus a non-REM AHI of 54.5 /hour. The patient spent 204 minutes sleep time in the supine position 124 minutes in non-supine. The supine AHI was 54.6 /hour versus a non-supine AHI of 0.0 /hour.  OXYGEN SATURATION & C02:  The wake baseline 02 saturation was 97%, with the lowest being 88%. Time spent below 89% saturation equaled 0 minutes.    TITRATION STUDY WITH CPAP RESULTS:   CPAP was initiated at 5 cmH20 under a nasal mask, N30 in  small size- ResMed model- with heated humidity per AASM split night standards and pressure was advanced to 10 cmH20. At a final PAP pressure of 9 cmH20, there was a reduction of the AHI to 0.0 /hour.   Total recording time (TRT) was 378 minutes, with a total sleep time (TST) of 311.5 minutes. The patient's sleep latency was 2.5 minutes. REM latency was 0 minutes.  The sleep efficiency was 82.4 %.    SLEEP ARCHITECTURE: Wake after sleep was 64 minutes, Stage N1 47.5 minutes, Stage N2 212.5 minutes, Stage N3 51.5 minutes and Stage R (REM sleep) 0 minutes. The percentages were: Stage N1 15.2%, Stage N2 68.2%, Stage N3 16.5% and Stage R (REM sleep) 0%.   RESPIRATORY ANALYSIS:  There were a total of 4 respiratory events: 0 obstructive apneas, 2 central apneas and 0 mixed apneas with a total of 2 apneas and an apnea index (AI) of .4. There were 2 hypopneas with a hypopnea index of .4 /hour. The patient also had 0 respiratory event related arousals (RERAs).      The total APNEA/HYPOPNEA INDEX (AHI) was 0.8 /hour and the total RESPIRATORY DISTURBANCE INDEX was .8 /hour.  0 events occurred in REM sleep and 4 events in NREM. The REM AHI was 0 /hour versus a non-REM AHI of .8 /hour. The patient spent 60% of total sleep time in the supine position. The supine AHI was 1.2 /hour, versus a non-supine AHI of 0.0/hour.  OXYGEN SATURATION & C02:  The wake baseline 02 saturation was 94%, with the lowest being 90%. Time spent below 89% saturation equaled 0 minutes.  The arousals were noted as: 51 were spontaneous, 0 were associated with PLMs, 1 was associated with respiratory events. The patient had a total of 0 Periodic Limb Movements.      POLYSOMNOGRAPHY IMPRESSION :   1. SEVERE and Complex Sleep Apnea (CSA) was treated with CPAP in this CPAP dependent patient, [place]  2. Fragmented sleep, many spontaneous arousals from sleep 3. Many PVCs in single channel EKG.  4. NO REM  sleep was recorded.  5. No additional  oxygen was needed for sleep at this altitude.   RECOMMENDATIONS: Continue CPAP use with autotitration device at a range from 5 cm water  through 10 cm water , 1 cm EPR, under heated humidification and nasal N 20 mask in small size.    A follow up appointment will be scheduled in the Sleep Clinic at [facility].      I certify that I have reviewed the entire raw data recording prior to the issuance of this report in accordance with the Standards of Accreditation of the American Academy of Sleep Medicine (AASM)    Larey Seat, M.D. Diplomat, Tax adviser of Psychiatry and  Neurology  Diplomat, American Board of Sleep Medicine Market researcher, [employer] [facility] at [employer]

## 2021-01-21 ENCOUNTER — Other Ambulatory Visit: Payer: Self-pay | Admitting: Cardiovascular Disease

## 2021-01-22 ENCOUNTER — Other Ambulatory Visit: Payer: Self-pay

## 2021-01-22 ENCOUNTER — Ambulatory Visit: Payer: Medicare Other | Attending: Neurology

## 2021-01-22 DIAGNOSIS — R2681 Unsteadiness on feet: Secondary | ICD-10-CM | POA: Insufficient documentation

## 2021-01-22 DIAGNOSIS — R2689 Other abnormalities of gait and mobility: Secondary | ICD-10-CM | POA: Insufficient documentation

## 2021-01-22 DIAGNOSIS — R42 Dizziness and giddiness: Secondary | ICD-10-CM | POA: Diagnosis not present

## 2021-01-22 DIAGNOSIS — S0990XS Unspecified injury of head, sequela: Secondary | ICD-10-CM | POA: Diagnosis not present

## 2021-01-23 ENCOUNTER — Telehealth: Payer: Self-pay | Admitting: *Deleted

## 2021-01-23 NOTE — Telephone Encounter (Signed)
I called the patient and LVM (ok per DPR) advising that Dr. Brett Fairy reviewed her sleep study and recommends that she continue her CPAP usage.  I advised the orders were sent over to [facility] so they are able to manage it and provide her equipment as necessary.  I asked her to give [place] a call back if she has any questions.  Her next appointment has already been scheduled for August 22 at 10:30 AM.  Secure message sent to [facility] staff regarding CPAP orders.

## 2021-01-23 NOTE — Progress Notes (Signed)
See phone note

## 2021-01-23 NOTE — Therapy (Addendum)
[facility] [address] [place], [place], [address] Phone: [phone]   Fax:  [phone]  Physical Therapy Evaluation  Patient Details  Name: Tracey [name] MRN: [id number] Date of Birth: 01/19/1946 Referring Provider (PT): Dr. Asencion Partridge Dohmeier   Encounter Date: 01/22/2021   PT End of Session - 01/22/21 1440     Visit Number 2    Number of Visits 9    Date for PT Re-Evaluation 03/26/21    PT Start Time 1110    PT Stop Time 1145    PT Time Calculation (min) 35 min    Equipment Utilized During Treatment Gait belt    Activity Tolerance Patient tolerated treatment well    Behavior During Therapy WFL for tasks assessed/performed             Past Medical History:  Diagnosis Date   Allergy    Anxiety    takes Ativan daily prn   Arthritis    Breast cancer ([place]) 2014   ER+/PR+/HEr2-,    Bruises easily    Colon polyps    2 polyps by report   Diverticulosis    GERD (gastroesophageal reflux disease)    occasionally takes Nexium    Gout    takes Allopurinol daily and Colchicine daily prn;last attack 48yr ago   Gout    History of bladder infections    many yrs ago   History of bronchitis    last time at least 831yrago   History of stress incontinence    Hyperlipidemia    takes Pravastatin daily   Insomnia    takes Ambien nightly prn   NSTEMI (non[place]ST elevated [place] infarction) [place]   OSA (obstructive sleep apnea)    on CPAP   Osteoarthritis    Peripheral edema    takes Furosemide daily prn   PONV (postoperative nausea and vomiting)    Postmenopausal hormone therapy    Radiation 07/27/13-09/07/13   Right Breast x 31 treatments   Rhinitis    uses Flonase prn   Sinus congestion    Status post breast reconstruction 10/15/14   Bilateral implant removal and DIEP performed in [place]   Tachycardia    takes Metoprolol daily   Ventricular tachycardia, non-sustained ([facility]   during sleep  study 2009 with normal cardiac workup    Past Surgical History:  Procedure Laterality Date   ABDOMINAL HYSTERECTOMY     with BSO   APPENDECTOMY     AXILLARY SENTINEL NODE BIOPSY Right 05/23/2013   Procedure: AXILLARY SENTINEL NODE BIOPSY;  Surgeon: ToOdis HollingsheadMD;  [facility]: General;  Laterality: Right;  nuc med injection 7:00   BREAST RECONSTRUCTION WITH PLACEMENT OF TISSUE EXPANDER AND FLEX HD (ACELLULAR HYDRATED DERMIS) Bilateral 05/23/2013   Procedure: BILATERAL BREAST RECONSTRUCTION WITH PLACEMENT OF TISSUE EXPANDER AND FLEX HD;  Surgeon: DaCrissie ReeseMD;  [facility]: [facility];  Laterality: Bilateral;   CARDIAC CATHETERIZATION  1999   CATARACT EXTRACTION, BILATERAL Bilateral    CHOLECYSTECTOMY     COSMETIC SURGERY     OTHER SURGICAL HISTORY  09/2017   had cyst removal from spine    PERCUTANEOUS CORONARY STENT INTERVENTION (PCI-S)  08/2017   [place] Bilateral 05/17/2014   Procedure: REMOVAL OF BILATERAL TISSUE EXPANDERS WITH PLACEMENT OF BILATERAL BREAST IMPLANTS FOR RECONSTRUCTION;  Surgeon: DaCrissie ReeseMD;  Location: [facility];  Service: Plastics;  Laterality: Bilateral;   TONSILLECTOMY     TOTAL KNEE ARTHROPLASTY Right 07/26/2018   Procedure: TOTAL KNEE ARTHROPLASTY;  Surgeon: Paralee Cancel, MD;  Location: [facility];  Service: Orthopedics;  Laterality: Right;  70 mins   TOTAL MASTECTOMY Bilateral 05/23/2013   Procedure: TOTAL MASTECTOMY;  Surgeon: [name], MD;  Location: [facility];  Service: General;  Laterality: Bilateral;    There were no vitals filed for this visit.    Subjective Assessment - 01/22/21 1114     Subjective Describes a history of dizziness with head turns, fell out of bed and struck head ~ 2years ago, workup to date has been negative however symptoms are gradually worsening    Patient is accompained by: Family member     Pertinent History Breast Cancer 2014, MI, Rt TKA    Limitations Walking    How long can you sit comfortably? unlimited    How long can you stand comfortably? unlimited    How long can you walk comfortably? unlimited    Patient Stated Goals To become more stable with her balance    Currently in Pain? No/denies               01/22/21 0001  Assessment  Medical Diagnosis gait disorder  Referring Provider (PT) Carment Dohmeir MD  Prior Therapy Yes  Precautions  Precautions None  Prior Function  Level of Independence Independent with basic ADLs;Independent with household mobility with device;Independent with community mobility with device  Vocation Retired  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, education  Leisure gardening, golf  Sensation  Light Touch Appears Intact  Coordination  Gross Motor Movements are Fluid and Coordinated Yes  Posture/Postural Control  Posture/Postural Control Postural limitations  Postural Limitations Rounded Shoulders;Forward head;Increased thoracic kyphosis  Strength  Overall Strength Comments WFL in seated MMT  Transfers  Five time sit to stand comments  14.5s  Ambulation/Gait  Ambulation Distance (Feet) 100 Feet  Gait Pattern Step-through pattern;Scissoring;Ataxic  Gait Comments mild LOB with distraction, head turns, poor focus  Dynamic Gait Index  Level Surface 3  Change in Gait Speed 2  Gait with Horizontal Head Turns 1  Gait with Vertical Head Turns 2  DGI comment: needed to repeat several sections to inconsistent  Gait and Pivot Turn 3  Step Over Obstacle 3  Step Around Obstacles 3  Steps 2  Total Score 19  High Level Balance  High Level Balance Comments MCTSIB 30s hold in all 4 positions                 Objective measurements completed on examination: See above findings.                 PT Short Term Goals - 01/22/21 0906       PT SHORT TERM GOAL #1   Title Pt will be able to be  screened for vestibular dysfunction    Baseline TBD    Time 4    Period Weeks    Status New    Target Date 02/27/21      PT SHORT TERM GOAL #2   Title Partient to demo I in initial HEP    Baseline TBD    Time 5    Period Weeks    Status New    Target Date 02/27/21      PT SHORT TERM GOAL #3   Title Patient to ambulate 556f across oudoor surfaces including grass and  slopes    Baseline 123f ambuation across indoor level surfaces w/o need of AD    Time 5    Period Weeks    Status New    Target Date 02/27/21               PT Long Term Goals - 01/23/21 0909       PT LONG TERM GOAL #1   Title Patient to score >48 on BERG    Baseline Initial BERG score 43    Time 9    Period Weeks    Status New    Target Date 03/27/21      PT LONG TERM GOAL #2   Title Patient to demo I in final HEP    Baseline TBD    Time 9    Period Weeks    Status New    Target Date 03/27/21      PT LONG TERM GOAL #3   Title Patient to score 22 on DGI    Baseline Initial DGI 19    Time 9    Period Weeks    Status New    Target Date 03/27/21      PT LONG TERM GOAL #4   Title Patient to report resolution of balance and visual disturbances with activity    Baseline Patient reports symptoms with visual tracking    Time 9    Period Weeks    Status New    Target Date 03/27/21                    Plan - 01/22/21 1438     Clinical Impression Statement Patient seen today for further assessment into gait instability and balance issues.  Upon assessment patient able to transfer and ambulate w/o AD I.  Mld balance deficts noted with head turns but no LOB, Dix-Hall-Pike negative B, gaze tracking elicited diplopia with lateral movements.  DGI re-assessed as well as MCTSIB with patient able to hold 30s at all positions    Personal Factors and Comorbidities Comorbidity 3+    Comorbidities MI, Rt TKA, cancer    Examination-Activity Limitations Locomotion Level;Stand     Examination-Participation Restrictions Cleaning;Shop;Community Activity;Yard Work;Interpersonal Relationship    Stability/Clinical Decision Making Evolving/Moderate complexity    Rehab Potential Excellent    PT Frequency 1x / week    PT Duration 8 weeks    PT Treatment/Interventions ADLs/Self Care Home Management;Therapeutic activities;Patient/family education;Balance training;Gait training;Neuromuscular re-education;DME Instruction;Therapeutic exercise;Vestibular    PT Next Visit Plan Assess visual and gaze dysfunction and modify POC as appropriate    PT Home Exercise Plan TBD    Consulted and Agree with Plan of Care Patient             Patient will benefit from skilled therapeutic intervention in order to improve the following deficits and impairments:  Abnormal gait, Decreased safety awareness, Decreased endurance, Decreased mobility, Decreased balance  Visit Diagnosis: Unsteadiness on feet  Other abnormalities of gait and mobility  Balance disturbance due to old head injury     Problem List Patient Active Problem List   Diagnosis Date Noted   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   CPAP (continuous positive airway pressure) dependence 01/20/2021   Unsteady gait when walking 01/20/2021   Complaint related to dreams 110/25/8527  Metabolic acidosis with increased anion gap and accumulation of organic acids 08/17/2018   Nausea, vomiting, and diarrhea 08/17/2018   AKI (acute kidney injury) ([facility] 08/17/2018  Overweight (BMI 25.0-29.9) 07/27/2018   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/27/2018   S/P right TKA 07/26/2018   S/P knee replacement 07/26/2018   UARS (upper airway resistance syndrome) 05/30/2018   Hyperlipidemia 05/02/2018   Leg injury, right, initial encounter 01/06/2017   Contusion of right lower leg 01/06/2017   Hypoxemia 12/23/2015   Statin myopathy 05/20/2015   MCI (mild cognitive impairment) 05/20/2015   Edema  05/17/2015   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

## 2021-01-28 ENCOUNTER — Ambulatory Visit: Payer: Medicare Other | Admitting: Physical Therapy

## 2021-01-28 ENCOUNTER — Other Ambulatory Visit: Payer: Self-pay

## 2021-01-28 DIAGNOSIS — S0990XS Unspecified injury of head, sequela: Secondary | ICD-10-CM | POA: Diagnosis not present

## 2021-01-28 DIAGNOSIS — R2681 Unsteadiness on feet: Secondary | ICD-10-CM

## 2021-01-28 DIAGNOSIS — R2689 Other abnormalities of gait and mobility: Secondary | ICD-10-CM | POA: Diagnosis not present

## 2021-01-28 DIAGNOSIS — R42 Dizziness and giddiness: Secondary | ICD-10-CM

## 2021-01-28 NOTE — Therapy (Signed)
[facility] [address] [place], [place], [address] Phone: [phone]   Fax:  [phone]  Physical Therapy Treatment  Patient Details  Name: Tracey [name] MRN: [id number] Date of Birth: [birth year] Referring Provider (PT): Dr. Asencion Partridge Dohmeier   Encounter Date: 01/28/2021   PT End of Session - 01/28/21 1218     Visit Number 3    Number of Visits 9    Date for PT Re-Evaluation 02/24/21    Authorization Type Medicare A/B; [employer]    Progress Note Due on Visit 10    PT Start Time 1109    PT Stop Time 1150    PT Time Calculation (min) 41 min    Activity Tolerance Patient tolerated treatment well    Behavior During Therapy [facility] for tasks assessed/performed             Past Medical History:  Diagnosis Date   Allergy    Anxiety    takes Ativan daily prn   Arthritis    Breast cancer ([place]) 2014   ER+/PR+/HEr2-,    Bruises easily    Colon polyps    2 polyps by report   Diverticulosis    GERD (gastroesophageal reflux disease)    occasionally takes Nexium    Gout    takes Allopurinol daily and Colchicine daily prn;last attack 37yr ago   Gout    History of bladder infections    many yrs ago   History of bronchitis    last time at least 82yrago   History of stress incontinence    Hyperlipidemia    takes Pravastatin daily   Insomnia    takes Ambien nightly prn   NSTEMI (non-ST elevated [place] infarction) [place]   OSA (obstructive sleep apnea)    on CPAP   Osteoarthritis    Peripheral edema    takes Furosemide daily prn   PONV (postoperative nausea and vomiting)    Postmenopausal hormone therapy    Radiation 07/27/13-09/07/13   Right Breast x 31 treatments   Rhinitis    uses Flonase prn   Sinus congestion    Status post breast reconstruction 10/15/14   Bilateral implant removal and DIEP performed in [place]   Tachycardia    takes Metoprolol daily   Ventricular tachycardia,  non-sustained ([facility]   during sleep study 2009 with normal cardiac workup    Past Surgical History:  Procedure Laterality Date   ABDOMINAL HYSTERECTOMY     with BSO   APPENDECTOMY     AXILLARY SENTINEL NODE BIOPSY Right 05/23/2013   Procedure: AXILLARY SENTINEL NODE BIOPSY;  Surgeon: ToOdis HollingsheadMD;  [facility]: General;  Laterality: Right;  nuc med injection 7:00   BREAST RECONSTRUCTION WITH PLACEMENT OF TISSUE EXPANDER AND FLEX HD (ACELLULAR HYDRATED DERMIS) Bilateral 05/23/2013   Procedure: BILATERAL BREAST RECONSTRUCTION WITH PLACEMENT OF TISSUE EXPANDER AND FLEX HD;  Surgeon: DaCrissie ReeseMD;  [facility] Service: Plastics;  Laterality: Bilateral;   CARDIAC CATHETERIZATION  1999   CATARACT EXTRACTION, BILATERAL Bilateral    CHOLECYSTECTOMY     COSMETIC SURGERY     OTHER SURGICAL HISTORY  09/2017   had cyst removal from spine    PERCUTANEOUS CORONARY STENT INTERVENTION (PCI-S)  08/2017   [place] BILATERAL BREAST IMPLANTS Bilateral 05/17/2014   Procedure: REMOVAL OF BILATERAL TISSUE EXPANDERS WITH PLACEMENT OF  BILATERAL BREAST IMPLANTS FOR RECONSTRUCTION;  Surgeon: [name], MD;  Location: [facility];  Service: Plastics;  Laterality: Bilateral;   TONSILLECTOMY     TOTAL KNEE ARTHROPLASTY Right 07/26/2018   Procedure: TOTAL KNEE ARTHROPLASTY;  Surgeon: Paralee Cancel, MD;  [facility];  Service: Orthopedics;  Laterality: Right;  70 mins   TOTAL MASTECTOMY Bilateral 05/23/2013   Procedure: TOTAL MASTECTOMY;  Surgeon: [name], MD;  Location: [facility];  Service: General;  Laterality: Bilateral;    There were no vitals filed for this visit.   Subjective Assessment - 01/28/21 1229     Subjective Still having difficulty maintaining balance when reaching down to ground or when turning.  Ready to get to work on this; going out of town in a few weeks.    Patient is accompained by:  Family member    Pertinent History Breast Cancer 2014, MI, Rt TKA    Limitations Walking    How long can you sit comfortably? unlimited    How long can you stand comfortably? unlimited    How long can you walk comfortably? unlimited    Patient Stated Goals To become more stable with her balance    Currently in Pain? No/denies                     Vestibular Assessment - 01/28/21 1113       Symptom Behavior   Subjective history of current problem Pt reports onset of symptoms after fall two years ago when she struck her head and then after she had an MI.  It is getting worse.  Pt golfs; starting to bother her golfing.  When she bends down or during follow through she feels like her body keeps going.  Also needs help when descending stairs and bleachers.  Vision is not as clear as it used to be.  Pt is HOH; no tinnitus but hears "music" in her ear when around fans/machines, etc.  Pt also reports some issues with hands and feet going numb intermittently.    Type of Dizziness  Unsteady with head/body turns;Imbalance    Symptom Nature Positional    Aggravating Factors Turning body quickly;Turning head quickly;Forward bending;Walking in a crowd    Relieving Factors Slow movements;Comments   Widen BOS   Progression of Symptoms Worse      Oculomotor Exam   Oculomotor Alignment Abnormal    Ocular ROM WFL    Spontaneous Absent    Gaze-induced  Absent    Smooth Pursuits Intact    Saccades Intact    Comment Exophoria with cover-uncover but pt reports having this as a child.      Oculomotor Exam-Fixation Suppressed    Left Head Impulse Positive    Right Head Impulse negative      Vestibulo-Ocular Reflex   VOR to Slow Head Movement Normal    VOR Cancellation Normal      Visual Acuity   Static 8    Dynamic 4      Balancemaster   Balancemaster Comment MCTSIB: Condition 1: 30 seconds, Condition 2: 5-6 seconds.  Condition 3: 30 seconds.  Condition 4: 3-4 seconds      Positional  Sensitivities   Nose to Right Knee Mild dizziness    Right Knee to Sitting Mild dizziness    Nose to Left Knee Mild dizziness    Left Knee to Sitting Mild dizziness    Head Turning x 5 Moderate dizziness    Head Nodding x  5 Mild dizziness    Pivot Right in Standing Moderate dizziness    Pivot Left in Standing No dizziness                              PT Education - 01/28/21 1215     Education Details vestibular clinical findings, addition to POC, goals of treatment and initial HEP - corner balance    Person(s) Educated Patient    Methods Explanation    Comprehension Verbalized understanding              PT Short Term Goals - 01/28/21 1219       PT SHORT TERM GOAL #1   Title Pt will be able to be screened for vestibular dysfunction    Time 4    Period Weeks    Status Achieved    Target Date 01/27/21      PT SHORT TERM GOAL #2   Title Partient to demo I in initial HEP    Baseline initiated on 6/14    Time 4    Period Weeks    Status On-going    Target Date 01/27/21      PT SHORT TERM GOAL #3   Title Patient to ambulate 539f across oudoor surfaces including grass and slopes    Baseline 1064fambuation across indoor level surfaces w/o need of AD    Time 4    Period Weeks    Status Unable to assess    Target Date 01/27/21               PT Long Term Goals - 01/28/21 1220       PT LONG TERM GOAL #1   Title Patient to score >48 on BERG    Baseline Initial BERG score 43    Time 8    Period Weeks    Status New    Target Date 02/24/21      PT LONG TERM GOAL #2   Title Patient to demo I in final HEP    Baseline TBD    Time 8    Period Weeks    Status New    Target Date 02/24/21      PT LONG TERM GOAL #3   Title Patient to score 22 on DGI    Baseline Initial DGI 19    Time 8    Period Weeks    Status New    Target Date 02/24/21      PT LONG TERM GOAL #4   Title Pt will demonstrate improved vestibular function and use of VOR  as indicated by 2 line difference on DVA    Baseline 4 line difference (8,4)    Time 8    Period Weeks    Status Revised    Target Date 02/24/21      PT LONG TERM GOAL #5   Title Pt will demonstrate ability to maintain balance when vision removed as indicated by increase in time to 15 seconds on MCTSIB condition 2 and 4    Baseline 4-5 seconds maximum    Time 8    Period Weeks    Status New    Target Date 02/24/21                   Plan - 01/28/21 1222     Clinical Impression Statement Performed further assessment of balance and vestibular impairments.  Pt presents with vestibular hypofunction  and impaired use of VOR as indicated by HIT and DVA >4 line difference, motion sensitivity, increased use of hip strategy and decreased use of somatosensory and vestibular information to maintain balance.  PT to address with gaze adaptation, habituation and sensory integration training.    Personal Factors and Comorbidities Comorbidity 3+    Comorbidities MI, Rt TKA, cancer    Examination-Activity Limitations Locomotion Level;Stand    Examination-Participation Restrictions Cleaning;Shop;Community Activity;Yard Work;Interpersonal Relationship    Stability/Clinical Decision Making Evolving/Moderate complexity    Rehab Potential Excellent    PT Frequency 2x / week    PT Duration 8 weeks    PT Treatment/Interventions ADLs/Self Care Home Management;Therapeutic activities;Patient/family education;Balance training;Gait training;Neuromuscular re-education;DME Instruction;Therapeutic exercise;Vestibular;Stair training;Functional mobility training    PT Next Visit Plan Initiate HEP focusing on gaze adaptation, habituation to turning/head turns/bending down to ground; sensory integration with corner balance - especially EC.  Golf    Consulted and Agree with Plan of Care Patient             Patient will benefit from skilled therapeutic intervention in order to improve the following deficits  and impairments:  Abnormal gait, Decreased safety awareness, Decreased endurance, Decreased mobility, Decreased balance, Dizziness, Difficulty walking, Impaired vision/preception, Impaired sensation  Visit Diagnosis: Unsteadiness on feet  Other abnormalities of gait and mobility  Dizziness and giddiness     Problem List Patient Active Problem List   Diagnosis Date Noted   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   CPAP (continuous positive airway pressure) dependence 01/20/2021   Unsteady gait when walking 01/20/2021   Complaint related to dreams 76/28/3151   Metabolic acidosis with increased anion gap and accumulation of organic acids 08/17/2018   Nausea, vomiting, and diarrhea 08/17/2018   AKI (acute kidney injury) ([place]) 08/17/2018   Overweight (BMI 25.0-29.9) 07/27/2018   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/27/2018   S/P right TKA 07/26/2018   S/P knee replacement 07/26/2018   UARS (upper airway resistance syndrome) 05/30/2018   Hyperlipidemia 05/02/2018   Leg injury, right, initial encounter 01/06/2017   Contusion of right lower leg 01/06/2017   Hypoxemia 12/23/2015   Statin myopathy 05/20/2015   MCI (mild cognitive impairment) 05/20/2015   Edema 05/17/2015   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   Breast cancer genetic susceptibility 04/02/2015   OSA on CPAP 04/02/2015   CAD (coronary artery disease), native coronary artery 01/01/2014   Chest pain 01/01/2014   Hypertension    Ventricular tachycardia, non-sustained ([place])    Breast cancer of upper-outer quadrant of right female breast ([place]) 06/28/2013   Postoperative visit 06/02/2013   Gout    Tachycardia     Rico Junker, PT, DPT 01/28/21    12:31 PM    [facility] [address] [place], [place], [address] Phone: [phone]   Fax:  [phone]  Name: MYKEISHA DYSERT MRN: [id number] Date of Birth:  October 05, 1945

## 2021-01-28 NOTE — Patient Instructions (Signed)
Perform these exercises with your back to a corner and a chair in front for safety.    Feet Together, Head Motion - Eyes Open    With eyes open, feet together, move head slowly: up and down 10 times.  Rest, repeat moving head left and right, 10 times Repeat 1 times per session. Do 2 sessions per day.    Balance: Eyes Closed - FEET TOGETHER    Stand, feet close together, close eyes. Maintain balance 10 seconds. Repeat 3-4 times per set. Do 2 sessions per day

## 2021-02-03 ENCOUNTER — Ambulatory Visit: Payer: Medicare Other | Admitting: Physical Therapy

## 2021-02-03 ENCOUNTER — Other Ambulatory Visit: Payer: Self-pay

## 2021-02-03 DIAGNOSIS — R2681 Unsteadiness on feet: Secondary | ICD-10-CM | POA: Diagnosis not present

## 2021-02-03 DIAGNOSIS — R2689 Other abnormalities of gait and mobility: Secondary | ICD-10-CM

## 2021-02-03 DIAGNOSIS — R42 Dizziness and giddiness: Secondary | ICD-10-CM | POA: Diagnosis not present

## 2021-02-03 DIAGNOSIS — S0990XS Unspecified injury of head, sequela: Secondary | ICD-10-CM | POA: Diagnosis not present

## 2021-02-03 NOTE — Patient Instructions (Signed)
Gaze Stabilization - Tip Card  1.Target must remain in focus, not blurry, and appear stationary while head is in motion. 2.Perform exercises with small head movements (45 to either side of midline). 3.Increase speed of head motion so long as target is in focus. 4.If you wear eyeglasses, be sure you can see target through lens (therapist will give specific instructions for bifocal / progressive lenses). 5.These exercises may provoke dizziness or nausea. Work through these symptoms. If too dizzy, slow head movement slightly. Rest between each exercise. 6.Exercises demand concentration; avoid distractions. 7.For safety, perform standing exercises close to a counter, wall, corner, or next to someone.  Copyright  VHI. All rights reserved.   Gaze Stabilization - Standing Feet Apart   Feet shoulder width apart, keeping eyes on target on wall 3 feet away, tilt head down slightly and move head side to side for 60 seconds. Rest. Repeat while moving head up and down for 60 seconds. Steady pace, try to keep the body as still as possible Do 2-3 sessions per day.  2. Bending to Pick up   Stand with feet apart and an object resting on a low chair/ottoman 2 feet off the ground.  Face the object - look down at object, reach down and pick up, standing back up.  Look down and put it back down.  Repeat 5 times.    Turn so the object is to the RIGHT.  Using your LEFT hand, reach across and pick up the object.  Look back over to the right and put it back down.  Repeat 5 times with object to the right.    Turn around so the object is to the LEFT.  Repeat using your RIGHT hand, 5 times.   3.   Feet Together, NO Head Motion - Eyes Closed      With eyes closed and feet together.  Place one finger on a chair in front of you.  Close your eyes and focus on keeping your balance for 10-15 seconds. Repeat 2 times per session. Do 2 sessions per day. NO HEAD TURNS   Feet Apart, Head Motion - Eyes  Closed   With eyes closed and feet shoulder width apart, one finger touching a chair in front of your. Move head slowly, up and down 10 times. Repeat moving head left and right, slowly, 10 times. Repeat 2 times per session. Do 2 sessions per day.

## 2021-02-03 NOTE — Therapy (Signed)
[facility] [address] [place], [place], [address] Phone: [phone]   Fax:  [phone]  Physical Therapy Treatment  Patient Details  Name: Tracey [name] MRN: [id number] Date of Birth: [birth year] Referring Provider (PT): Dr. Asencion Partridge Dohmeier   Encounter Date: 02/03/2021   PT End of Session - 02/03/21 1020     Visit Number 4    Number of Visits 9    Date for PT Re-Evaluation 02/24/21    Authorization Type Medicare A/B; [employer]    Progress Note Due on Visit 10    PT Start Time 1020    PT Stop Time 1103    PT Time Calculation (min) 43 min    Activity Tolerance Patient tolerated treatment well    Behavior During Therapy WFL for tasks assessed/performed             Past Medical History:  Diagnosis Date   Allergy    Anxiety    takes Ativan daily prn   Arthritis    Breast cancer ([place]) 2014   ER+/PR+/HEr2-,    Bruises easily    Colon polyps    2 polyps by report   Diverticulosis    GERD (gastroesophageal reflux disease)    occasionally takes Nexium    Gout    takes Allopurinol daily and Colchicine daily prn;last attack 36yr ago   Gout    History of bladder infections    many yrs ago   History of bronchitis    last time at least 880yrago   History of stress incontinence    Hyperlipidemia    takes Pravastatin daily   Insomnia    takes Ambien nightly prn   [facility] [place]   OSA (obstructive sleep apnea)    on CPAP   Osteoarthritis    Peripheral edema    takes Furosemide daily prn   PONV (postoperative nausea and vomiting)    Postmenopausal hormone therapy    Radiation 07/27/13-09/07/13   Right Breast x 31 treatments   Rhinitis    uses Flonase prn   Sinus congestion    Status post breast reconstruction 10/15/14   Bilateral implant removal and DIEP performed in [place], [place]   Tachycardia    takes Metoprolol daily   Ventricular tachy[place],  [place]sustained [place]   during [place] study 2009 with normal cardiac workup    Past Surgical History:  Procedure Laterality Date   ABDOMINAL HYSTERECTOMY     with BSO   APPENDECTOMY     AXILLARY SENTINEL NODE BIOPSY Right 05/23/2013   Procedure: AXILLARY SENTINEL NODE BIOPSY;  Surgeon: ToOdis HollingsheadMD;  [facility] Service: General;  Laterality: Right;  nuc med injection 7:00   BREAST RECONSTRUCTION WITH PLACEMENT OF TISSUE EXPANDER AND FLEX HD (ACELLULAR HYDRATED DERMIS) Bilateral 05/23/2013   Procedure: BILATERAL BREAST RECONSTRUCTION WITH PLACEMENT OF TISSUE EXPANDER AND FLEX HD;  Surgeon: DaCrissie ReeseMD;  [facility] Service: [facility];  Laterality: Bilateral;   CARDIAC CATHETERIZATION  1999   CATARACT EXTRACTION, BILATERAL Bilateral    CHOLECYSTECTOMY     COSMETIC SURGERY     OTHER SURGICAL HISTORY  09/2017   had cyst removal from spine    PERCUTANEOUS CORONARY STENT INTERVENTION (PCI-S)  08/2017   [place] Bilateral 05/17/2014   Procedure: REMOVAL OF BILATERAL TISSUE EXPANDERS WITH PLACEMENT OF  BILATERAL BREAST IMPLANTS FOR RECONSTRUCTION;  Surgeon: [name], MD;  Location: [facility];  Service: Plastics;  Laterality: Bilateral;   TONSILLECTOMY     TOTAL KNEE ARTHROPLASTY Right 07/26/2018   Procedure: TOTAL KNEE ARTHROPLASTY;  Surgeon: Paralee Cancel, MD;  Location: [facility];  Service: Orthopedics;  Laterality: Right;  70 mins   TOTAL MASTECTOMY Bilateral 05/23/2013   Procedure: TOTAL MASTECTOMY;  Surgeon: [name], MD;  Location: [facility];  Service: General;  Laterality: Bilateral;    There were no vitals filed for this visit.   Subjective Assessment - 02/03/21 1021     Subjective Was in a golf tournament with daughter, too hot on Friday but played on Saturday.  Only had one episode of dizziness; had fun.  Still feeling good.    Patient is accompained by: Family  member    Pertinent History Breast Cancer 2014, MI, Rt TKA    Limitations Walking    How long can you sit comfortably? unlimited    How long can you stand comfortably? unlimited    How long can you walk comfortably? unlimited    Patient Stated Goals To become more stable with her balance    Currently in Pain? No/denies             Reviewed the following exercises with patient for gaze adaptation, habituation and sensory integration.  Pt return demonstrated all exercises.  Guidelines provided for safety at home when performing:  Gaze Stabilization - Tip Card  1.Target must remain in focus, not blurry, and appear stationary while head is in motion. 2.Perform exercises with small head movements (45 to either side of midline). 3.Increase speed of head motion so long as target is in focus. 4.If you wear eyeglasses, be sure you can see target through lens (therapist will give specific instructions for bifocal / progressive lenses). 5.These exercises may provoke dizziness or nausea. Work through these symptoms. If too dizzy, slow head movement slightly. Rest between each exercise. 6.Exercises demand concentration; avoid distractions. 7.For safety, perform standing exercises close to a counter, wall, corner, or next to someone.  Copyright  VHI. All rights reserved.   Gaze Stabilization - Standing Feet Apart   Feet shoulder width apart, keeping eyes on target on wall 3 feet away, tilt head down slightly and move head side to side for 60 seconds. Rest. Repeat while moving head up and down for 60 seconds. Steady pace, try to keep the body as still as possible Do 2-3 sessions per day.  2. Bending to Pick up   Stand with feet apart and an object resting on a low chair/ottoman 2 feet off the ground.  Face the object - look down at object, reach down and pick up, standing back up.  Look down and put it back down.  Repeat 5 times.    Turn so the object is to the RIGHT.  Using your LEFT  hand, reach across and pick up the object.  Look back over to the right and put it back down.  Repeat 5 times with object to the right.    Turn around so the object is to the LEFT.  Repeat using your RIGHT hand, 5 times.   3.   Feet Together, NO Head Motion - Eyes Closed      With eyes closed and feet together.  Place one finger on a chair in front of you.  Close your eyes and focus on keeping your balance for 10-15 seconds. Repeat 2 times  per session. Do 2 sessions per day. NO HEAD TURNS   Feet Apart, Head Motion - Eyes Closed   With eyes closed and feet shoulder width apart, one finger touching a chair in front of your. Move head slowly, up and down 10 times. Repeat moving head left and right, slowly, 10 times. Repeat 2 times per session. Do 2 sessions per day.     PT Education - 02/03/21 1216     Education Details initial vestibular HEP    Person(s) Educated Patient    Methods Explanation;Demonstration;Handout    Comprehension Verbalized understanding;Returned demonstration              PT Short Term Goals - 02/03/21 1212       PT SHORT TERM GOAL #1   Title Pt will be able to be screened for vestibular dysfunction    Time 4    Period Weeks    Status Achieved    Target Date 01/27/21      PT SHORT TERM GOAL #2   Title Partient to demo I in initial HEP    Baseline initiated on 6/14    Time 4    Period Weeks    Status Achieved    Target Date 01/27/21      PT SHORT TERM GOAL #3   Title Patient to ambulate 570f across oudoor surfaces including grass and slopes    Baseline 1035fambuation across indoor level surfaces w/o need of AD    Time 4    Period Weeks    Status Achieved    Target Date 01/27/21               PT Long Term Goals - 01/28/21 1220       PT LONG TERM GOAL #1   Title Patient to score >48 on BERG    Baseline Initial BERG score 43    Time 8    Period Weeks    Status New    Target Date 02/24/21      PT LONG TERM GOAL #2    Title Patient to demo I in final HEP    Baseline TBD    Time 8    Period Weeks    Status New    Target Date 02/24/21      PT LONG TERM GOAL #3   Title Patient to score 22 on DGI    Baseline Initial DGI 19    Time 8    Period Weeks    Status New    Target Date 02/24/21      PT LONG TERM GOAL #4   Title Pt will demonstrate improved vestibular function and use of VOR as indicated by 2 line difference on DVA    Baseline 4 line difference (8,4)    Time 8    Period Weeks    Status Revised    Target Date 02/24/21      PT LONG TERM GOAL #5   Title Pt will demonstrate ability to maintain balance when vision removed as indicated by increase in time to 15 seconds on MCTSIB condition 2 and 4    Baseline 4-5 seconds maximum    Time 8    Period Weeks    Status New    Target Date 02/24/21                   Plan - 02/03/21 1213     Clinical Impression Statement Pt has met all STG; pt return demonstrated  all vestibular exercises provided to patient today focusing on gaze adaptation, sensory integration and habituation to bending down to the ground.  Pt reports being able to ambulate longer distances on variety of surfaces on golf course this past weekend without difficulty.  PT to continue to address vestibular impairments to progress towards LTG.    Personal Factors and Comorbidities Comorbidity 3+    Comorbidities MI, Rt TKA, cancer    Examination-Activity Limitations Locomotion Level;Stand    Examination-Participation Restrictions Cleaning;Shop;Community Activity;Yard Work;Interpersonal Relationship    Stability/Clinical Decision Making Evolving/Moderate complexity    Rehab Potential Excellent    PT Frequency 2x / week    PT Duration 8 weeks    PT Treatment/Interventions ADLs/Self Care Home Management;Therapeutic activities;Patient/family education;Balance training;Gait training;Neuromuscular re-education;DME Instruction;Therapeutic exercise;Vestibular;Stair training;Functional  mobility training    PT Next Visit Plan How is HEP going?  Need to add more visits? Progress HEP focusing on gaze adaptation, habituation to turning/head turns/bending down to ground; sensory integration with corner balance - especially EC.  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   CPAP (continuous positive airway pressure) dependence 01/20/2021   Unsteady gait when walking 01/20/2021   Complaint related to dreams 42/59/5638   Metabolic acidosis with increased anion gap and accumulation of organic acids 08/17/2018   Nausea, vomiting, and diarrhea 08/17/2018   AKI (acute kidney injury) ([place]) 08/17/2018   Overweight (BMI 25.0-29.9) 07/27/2018   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/27/2018   S/P right TKA 07/26/2018   S/P knee replacement 07/26/2018   UARS (upper airway resistance syndrome) 05/30/2018   Hyperlipidemia 05/02/2018   Leg injury, right, initial encounter 01/06/2017   Contusion of right lower leg 01/06/2017   Hypoxemia 12/23/2015   Statin myopathy 05/20/2015   MCI (mild cognitive impairment) 05/20/2015   Edema 05/17/2015   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 [place], [place], [address] Phone: [phone]   Fax:  [phone]  Name: HAMSINI VERRILLI MRN: [id number] Date of Birth: 07-Jun-1946

## 2021-02-06 ENCOUNTER — Other Ambulatory Visit: Payer: Self-pay

## 2021-02-06 ENCOUNTER — Encounter: Payer: Self-pay | Admitting: Physical Therapy

## 2021-02-06 ENCOUNTER — Ambulatory Visit: Payer: Medicare Other | Admitting: Physical Therapy

## 2021-02-06 VITALS — BP 136/77 | HR 83

## 2021-02-06 DIAGNOSIS — S0990XS Unspecified injury of head, sequela: Secondary | ICD-10-CM | POA: Diagnosis not present

## 2021-02-06 DIAGNOSIS — R42 Dizziness and giddiness: Secondary | ICD-10-CM

## 2021-02-06 DIAGNOSIS — R2689 Other abnormalities of gait and mobility: Secondary | ICD-10-CM | POA: Diagnosis not present

## 2021-02-06 DIAGNOSIS — R2681 Unsteadiness on feet: Secondary | ICD-10-CM

## 2021-02-06 NOTE — Therapy (Signed)
[facility] [address] [place], [place], [address] Phone: [phone]   Fax:  [phone]  Physical Therapy Treatment  Patient Details  Name: Tracey [name] MRN: [id number] Date of Birth: Mar 30, 1946 Referring Provider (PT): Dr. Asencion Partridge Dohmeier   Encounter Date: 02/06/2021   PT End of Session - 02/06/21 1155     Visit Number 5    Number of Visits 9    Date for PT Re-Evaluation 02/24/21    Authorization Type Medicare A/B; [employer]    Progress Note Due on Visit 10    PT Start Time 1153    PT Stop Time 1235    PT Time Calculation (min) 42 min    Activity Tolerance Patient tolerated treatment well    Behavior During Therapy [facility] for tasks assessed/performed             Past Medical History:  Diagnosis Date   Allergy    Anxiety    takes Ativan daily prn   Arthritis    Breast cancer ([place]) 2014   ER+/PR+/HEr2-,    Bruises easily    Colon polyps    2 polyps by report   Diverticulosis    GERD (gastroesophageal reflux disease)    occasionally takes Nexium    Gout    takes Allopurinol daily and Colchicine daily prn;last attack 8yr ago   Gout    History of bladder infections    many yrs ago   History of bronchitis    last time at least 827yrago   History of stress incontinence    Hyperlipidemia    takes Pravastatin daily   Insomnia    takes Ambien nightly prn   [place]   OSA (obstructive sleep apnea)    on CPAP   Osteoarthritis    Peripheral edema    takes Furosemide daily prn   PONV (postoperative nausea and vomiting)    Postmenopausal hormone therapy    Radiation 07/27/13-09/07/13   Right Breast x 31 treatments   Rhinitis    uses Flonase prn   Sinus congestion    Status post breast reconstruction 10/15/14   Bilateral implant removal and DIEP performed in [place], [place]   Tachycardia    takes Metoprolol daily   Ventricular tachycardia,  non-sustained ([facility]   during sleep study 2009 with normal cardiac workup    Past Surgical History:  Procedure Laterality Date   ABDOMINAL HYSTERECTOMY     with BSO   APPENDECTOMY     AXILLARY SENTINEL NODE BIOPSY Right 05/23/2013   Procedure: AXILLARY SENTINEL NODE BIOPSY;  Surgeon: ToOdis HollingsheadMD;  [facility] Service: General;  Laterality: Right;  nuc med injection 7:00   BREAST RECONSTRUCTION WITH PLACEMENT OF TISSUE EXPANDER AND FLEX HD (ACELLULAR HYDRATED DERMIS) Bilateral 05/23/2013   Procedure: BILATERAL BREAST RECONSTRUCTION WITH PLACEMENT OF TISSUE EXPANDER AND FLEX HD;  Surgeon: DaCrissie ReeseMD;  [place] [facility] Service: Plastics;  Laterality: Bilateral;   CARDIAC CATHETERIZATION  1999   CATARACT EXTRACTION, BILATERAL Bilateral    CHOLECYSTECTOMY     COSMETIC SURGERY     OTHER SURGICAL HISTORY  09/2017   had cyst removal from spine    PERCUTANEOUS CORONARY STENT INTERVENTION (PCI-S)  08/2017   [place]ILATERAL BRE[place] IMPLANTS Bilateral 05/17/2014   Procedure: REMOVAL OF BILATERAL TISSUE EXPANDERS WITH PLACEMENT OF  BILATERAL BREAST IMPLANTS FOR RECONSTRUCTION;  Surgeon: [name], MD;  Location: [facility];  Service: Plastics;  Laterality: Bilateral;   TONSILLECTOMY     TOTAL KNEE ARTHROPLASTY Right 07/26/2018   Procedure: TOTAL KNEE ARTHROPLASTY;  Surgeon: Paralee Cancel, MD;  Location: [facility];  Service: Orthopedics;  Laterality: Right;  70 mins   TOTAL MASTECTOMY Bilateral 05/23/2013   Procedure: TOTAL MASTECTOMY;  Surgeon: [name], MD;  Location: [facility];  Service: General;  Laterality: Bilateral;    Vitals:   02/06/21 1158  BP: 136/77  Pulse: 83     Subjective Assessment - 02/06/21 1156     Subjective Was feeling really good until this morning, woke up with her head feeling "full".  Still feeling a little off today.  Exercises going well.    Patient is accompained by: Family  member    Pertinent History Breast Cancer 2014, MI, Rt TKA    Limitations Walking    How long can you sit comfortably? unlimited    How long can you stand comfortably? unlimited    How long can you walk comfortably? unlimited    Patient Stated Goals To become more stable with her balance    Currently in Pain? No/denies                Balance Exercises - 02/06/21 1202       Balance Exercises: Standing   Sidestepping 5 reps;Limitations    Sidestepping Limitations to L and then to R with bending down to pass ball between legs in figure 8.    Turning Right;Left;5 reps;Limitations    Turning Limitations combined with bending down to pass ball between legs in figure 8; moderate symptoms after 5 reps.    Lift / Chop Right;Left;10 reps;Limitations    Lift / Chop Limitations standing on solid ground and then on foam.  Performed side to side rotations holding 3.3lb ball and then diagonals to L and R x 10 reps each, 2 sets.  Pt cued to keep eyes on ball during movement.  Mild imbalance and mild nausea.    Other Standing Exercises Walking around the gym to the L and then to the R while passing small medicine ball hand to hand; intermittent LOB laterally and intermittent dropping ball. Therapist provided min A for safety and balance.               PT Education - 02/06/21 2124     Education Details Will continue with current HEP; added more visits for later in July after pt returns from out of town    [employer]) Educated Patient    Methods Explanation    Comprehension Verbalized understanding              PT Short Term Goals - 02/03/21 1212       PT SHORT TERM GOAL #1   Title Pt will be able to be screened for vestibular dysfunction    Time 4    Period Weeks    Status Achieved    Target Date 01/27/21      PT SHORT TERM GOAL #2   Title Partient to demo I in initial HEP    Baseline initiated on 6/14    Time 4    Period Weeks    Status Achieved    Target Date 01/27/21       PT SHORT TERM GOAL #3   Title Patient to ambulate 594f across oudoor surfaces including grass and slopes  Baseline 131f ambuation across indoor level surfaces w/o need of AD    Time 4    Period Weeks    Status Achieved    Target Date 01/27/21               PT Long Term Goals - 01/28/21 1220       PT LONG TERM GOAL #1   Title Patient to score >48 on BERG    Baseline Initial BERG score 43    Time 8    Period Weeks    Status New    Target Date 02/24/21      PT LONG TERM GOAL #2   Title Patient to demo I in final HEP    Baseline TBD    Time 8    Period Weeks    Status New    Target Date 02/24/21      PT LONG TERM GOAL #3   Title Patient to score 22 on DGI    Baseline Initial DGI 19    Time 8    Period Weeks    Status New    Target Date 02/24/21      PT LONG TERM GOAL #4   Title Pt will demonstrate improved vestibular function and use of VOR as indicated by 2 line difference on DVA    Baseline 4 line difference (8,4)    Time 8    Period Weeks    Status Revised    Target Date 02/24/21      PT LONG TERM GOAL #5   Title Pt will demonstrate ability to maintain balance when vision removed as indicated by increase in time to 15 seconds on MCTSIB condition 2 and 4    Baseline 4-5 seconds maximum    Time 8    Period Weeks    Status New    Target Date 02/24/21                   Plan - 02/06/21 2127     Clinical Impression Statement Pt is performing exercises and is reporting improvement in symptoms; pt does report some increase in symptoms today but pt was able to participate in dynamic standing balance and habituation training to address visual motion sensitivity and movements to simulate playing golf.  Pt did report mild nausea that resolved quickly with rest break.  No change in HEP today.  Will continue to progress towards LTG.    Personal Factors and Comorbidities Comorbidity 3+    Comorbidities MI, Rt TKA, cancer    Examination-Activity  Limitations Locomotion Level;Stand    Examination-Participation Restrictions Cleaning;Shop;Community Activity;Yard Work;Interpersonal Relationship    Stability/Clinical Decision Making Evolving/Moderate complexity    Rehab Potential Excellent    PT Frequency 2x / week    PT Duration 8 weeks    PT Treatment/Interventions ADLs/Self Care Home Management;Therapeutic activities;Patient/family education;Balance training;Gait training;Neuromuscular re-education;DME Instruction;Therapeutic exercise;Vestibular;Stair training;Functional mobility training    PT Next Visit Plan Did she call to add more visits? Progress HEP focusing on gaze adaptation, habituation to turning/head turns/bending down to ground; sensory integration with corner balance - especially EC.  Medicine ball chops on foam for - Golf.  Bending down with ball between legs with turns/compliant surface.    Consulted and Agree with Plan of Care Patient             Patient will benefit from skilled therapeutic intervention in order to improve the following deficits and impairments:  Abnormal gait, Decreased safety awareness, Decreased endurance,  Decreased mobility, Decreased balance, Dizziness, Difficulty walking, Impaired vision/preception, Impaired sensation  Visit Diagnosis: Unsteadiness on feet  Other abnormalities of gait and mobility  Dizziness and giddiness     Problem List Patient Active Problem List   Diagnosis Date Noted   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   CPAP (continuous positive airway pressure) dependence 01/20/2021   Unsteady gait when walking 01/20/2021   Complaint related to dreams 16/05/9603   Metabolic acidosis with increased anion gap and accumulation of organic acids 08/17/2018   Nausea, vomiting, and diarrhea 08/17/2018   AKI (acute kidney injury) ([place]) 08/17/2018   Overweight (BMI 25.0-29.9) 07/27/2018   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/27/2018    S/P right TKA 07/26/2018   S/P knee replacement 07/26/2018   UARS (upper airway resistance syndrome) 05/30/2018   Hyperlipidemia 05/02/2018   Leg injury, right, initial encounter 01/06/2017   Contusion of right lower leg 01/06/2017   Hypoxemia 12/23/2015   Statin myopathy 05/20/2015   MCI (mild cognitive impairment) 05/20/2015   Edema 05/17/2015   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

## 2021-02-11 ENCOUNTER — Ambulatory Visit: Payer: Medicare Other

## 2021-02-14 ENCOUNTER — Encounter: Payer: Self-pay | Admitting: Physical Therapy

## 2021-02-14 DIAGNOSIS — M25562 Pain in left knee: Secondary | ICD-10-CM | POA: Diagnosis not present

## 2021-02-14 DIAGNOSIS — M1712 Unilateral primary osteoarthritis, left knee: Secondary | ICD-10-CM | POA: Diagnosis not present

## 2021-03-05 DIAGNOSIS — E538 Deficiency of other specified B group vitamins: Secondary | ICD-10-CM | POA: Diagnosis not present

## 2021-03-10 ENCOUNTER — Other Ambulatory Visit: Payer: Self-pay

## 2021-03-10 ENCOUNTER — Ambulatory Visit: Payer: Medicare Other | Attending: Neurology | Admitting: Physical Therapy

## 2021-03-10 DIAGNOSIS — E785 Hyperlipidemia, unspecified: Secondary | ICD-10-CM | POA: Diagnosis not present

## 2021-03-10 DIAGNOSIS — R2681 Unsteadiness on feet: Secondary | ICD-10-CM

## 2021-03-10 DIAGNOSIS — H8111 Benign paroxysmal vertigo, right ear: Secondary | ICD-10-CM

## 2021-03-10 DIAGNOSIS — M109 Gout, unspecified: Secondary | ICD-10-CM | POA: Diagnosis not present

## 2021-03-10 DIAGNOSIS — R42 Dizziness and giddiness: Secondary | ICD-10-CM

## 2021-03-10 DIAGNOSIS — R2689 Other abnormalities of gait and mobility: Secondary | ICD-10-CM | POA: Diagnosis not present

## 2021-03-10 NOTE — Therapy (Signed)
[facility] [address] [place], [place], [address] Phone: [phone]   Fax:  [phone]  Physical Therapy Treatment  Patient Details  Name: Tracey [name] MRN: [id number] Date of Birth: 1946-08-11 Referring Provider (PT): Dr. Asencion Partridge Dohmeier   Encounter Date: 03/10/2021   PT End of Session - 03/10/21 1323     Visit Number 6    Number of Visits 14    Date for PT Re-Evaluation 04/09/21    Authorization Type Medicare A/B; [employer]    Progress Note Due on Visit 10    PT Start Time 1320    PT Stop Time 1408    PT Time Calculation (min) 48 min    Activity Tolerance Patient tolerated treatment well    Behavior During Therapy [facility] for tasks assessed/performed             Past Medical History:  Diagnosis Date   Allergy    Anxiety    takes Ativan daily prn   Arthritis    Breast cancer ([place]) 2014   ER+/PR+/HEr2-,    Bruises easily    Colon polyps    2 polyps by report   Diverticulosis    GERD (gastroesophageal reflux disease)    occasionally takes Nexium    Gout    takes Allopurinol daily and Colchicine daily prn;last attack 50yr ago   Gout    History of bladder infections    many yrs ago   History of bronchitis    last time at least 875yrago   History of stress incontinence    Hyperlipidemia    takes Pravastatin daily   Insomnia    takes Ambien nightly prn   NSTEMI (non-ST elevated myocardial infarction) [place]   OSA (obstructive sleep apnea)    on CPAP   Osteoarthritis    Peripheral edema    takes Furosemide daily prn   PONV (postoperative nausea and vomiting)    Postmenopausal hormone therapy    Radiation 07/27/13-09/07/13   Right Breast x 31 treatments   Rhinitis    uses Flonase prn   Sinus congestion    Status post breast reconstruction 10/15/14   Bilateral implant removal and DIEP performed in [place]   Tachycardia    takes Metoprolol daily   Ventricular tachycardia,  non-sustained ([facility]   during sleep study 2009 with normal cardiac workup    Past Surgical History:  Procedure Laterality Date   ABDOMINAL HYSTERECTOMY     with BSO   APPENDECTOMY     AXILLARY SENTINEL NODE BIOPSY Right 05/23/2013   Procedure: AXILLARY SENTINEL NODE BIOPSY;  Surgeon: ToOdis HollingsheadMD;  [facility] Service: General;  Laterality: Right;  nuc med injection 7:00   BREAST RECONSTRUCTION WITH PLACEMENT OF TISSUE EXPANDER AND FLEX HD (ACELLULAR HYDRATED DERMIS) Bilateral 05/23/2013   Procedure: BILATERAL BREAST RECONSTRUCTION WITH PLACEMENT OF TISSUE EXPANDER AND FLEX HD;  Surgeon: DaCrissie ReeseMD;  [place] [facility] Service: Pl[place];  Laterality: Bilateral;   CARDIAC CATHETERIZATION  1999   CATARACT EXTRACTION, BILATERAL Bilateral    CHOLECYSTECTOMY     COSMETIC SURGERY     OTHER SURGICAL HISTORY  09/2017   had cyst removal from spine    PERCUTANEOUS CORONARY STENT INTERVENTION (PCI-S)  08/2017   [place] Bilateral 05/17/2014   Procedure: REMOVAL OF BILATERAL TISSUE EXPANDERS WITH PLACEMENT OF  BILATERAL BREAST IMPLANTS FOR RECONSTRUCTION;  Surgeon: [name], MD;  Location: [facility];  Service: Plastics;  Laterality: Bilateral;   TONSILLECTOMY     TOTAL KNEE ARTHROPLASTY Right 07/26/2018   Procedure: TOTAL KNEE ARTHROPLASTY;  Surgeon: Paralee Cancel, MD;  Location: [facility];  Service: Orthopedics;  Laterality: Right;  70 mins   TOTAL MASTECTOMY Bilateral 05/23/2013   Procedure: TOTAL MASTECTOMY;  Surgeon: [name], MD;  Location: [facility];  Service: General;  Laterality: Bilateral;    There were no vitals filed for this visit.   Subjective Assessment - 03/10/21 1324     Subjective Had a wonderful family reunion but caught COVID.  Had COVID 3 weeks ago; dizziness has returned and feels worse than ever.  Feels it lying down, turning her head and feels very disoriented.   Reports a slight HA - eyes feel heavy, pressure.  Pt feels she may need a cane outside.    Patient is accompained by: Family member    Pertinent History Breast Cancer 2014, MI, Rt TKA    Limitations Walking    How long can you sit comfortably? unlimited    How long can you stand comfortably? unlimited    How long can you walk comfortably? unlimited    Patient Stated Goals To become more stable with her balance    Currently in Pain? No/denies                [facility] PT Assessment - 03/10/21 1330       Assessment   Medical Diagnosis Unsteady gait, dizziness    Referring Provider (PT) Dr. Asencion Partridge Dohmeier    Onset Date/Surgical Date 12/09/20    Prior Therapy Yes, knees      Precautions   Precautions None      Prior Function   Level of Independence Independent with basic ADLs;Independent with household mobility without device;Independent with community mobility without device      Observation/Other Assessments   Focus on Therapeutic Outcomes (FOTO)  49%; FOTO closed                 Vestibular Assessment - 03/10/21 1331       Symptom Behavior   Subjective history of current problem Dizziness returned after COVID    Type of Dizziness  Unsteady with head/body turns;Imbalance    Symptom Nature Spontaneous    Aggravating Factors Turning body quickly;Turning head quickly;Forward bending;Lying supine;Rolling to right;Rolling to left;Supine to sit    Relieving Factors Slow movements    Progression of Symptoms Worse      Oculomotor Exam   Oculomotor Alignment Abnormal    Ocular ROM WFL    Spontaneous Absent    Gaze-induced  Right beating nystagmus with R gaze    Smooth Pursuits Intact    Saccades Hypometric;Undershoots   laterally, one extra eye movement     Vestibulo-Ocular Reflex   VOR to Slow Head Movement Normal    VOR Cancellation Normal      Visual Acuity   Static unable to assess today      Positional Testing   Dix-Hallpike Dix-Hallpike Right;Dix-Hallpike  Left    Sidelying Test Sidelying Right    Horizontal Canal Testing Horizontal Canal Right;Horizontal Canal Left      Dix-Hallpike Right   Dix-Hallpike Right Duration 0    Dix-Hallpike Right Symptoms No nystagmus      Dix-Hallpike Left   Dix-Hallpike Left Duration 0    Dix-Hallpike Left Symptoms No nystagmus   vertigo sitting  back upright     Sidelying Right   Sidelying Right Duration 0    Sidelying Right Symptoms No nystagmus      Horizontal Canal Right   Horizontal Canal Right Duration 30 seconds    Horizontal Canal Right Symptoms Nystagmus   R rotary     Horizontal Canal Left   Horizontal Canal Left Duration 0    Horizontal Canal Left Symptoms Normal      Balancemaster   Balancemaster Comment Unable to re-assess today                       Vestibular Treatment/Exercise - 03/10/21 1344       Vestibular Treatment/Exercise   Vestibular Treatment Provided Canalith Repositioning;Habituation    Canalith Repositioning Epley Manuever Right    Habituation Exercises Laruth Bouchard Daroff       EPLEY MANUEVER RIGHT   Number of Reps  1    Response Details  No significant symptoms during R dix-hallpike; experiences more symptoms with head in 30 deg flexion.  Assessed sidelying to the R without any symptoms.  Went back and re-assessed rolling to R without any symptoms.  Pt only reporting symptoms when transitioning from supine or sidelying > sit.      Nestor Lewandowsky   Number of Reps  1    Symptom Description  moderate symptoms sidelying > sit                   PT Education - 03/10/21 2033     Education Details BPPV, continuation of visits, Brandt-Daroff for habituation    Person(s) Educated Patient    Methods Explanation;Demonstration;Handout    Comprehension Verbalized understanding;Returned demonstration              PT Short Term Goals - 02/03/21 1212       PT SHORT TERM GOAL #1   Title Pt will be able to be screened for vestibular dysfunction     Time 4    Period Weeks    Status Achieved    Target Date 01/27/21      PT SHORT TERM GOAL #2   Title Partient to demo I in initial HEP    Baseline initiated on 6/14    Time 4    Period Weeks    Status Achieved    Target Date 01/27/21      PT SHORT TERM GOAL #3   Title Patient to ambulate 552f across oudoor surfaces including grass and slopes    Baseline 103fambuation across indoor level surfaces w/o need of AD    Time 4    Period Weeks    Status Achieved    Target Date 01/27/21               PT Long Term Goals - 03/10/21 2007       PT LONG TERM GOAL #1   Title Patient to score >48 on BERG    Baseline Initial BERG score 43    Time 8    Period Weeks    Status Unable to assess      PT LONG TERM GOAL #2   Title Patient to demo I in final HEP    Baseline ongoing    Time 8    Period Weeks    Status On-going      PT LONG TERM GOAL #3   Title Patient to score 22 on DGI    Baseline Initial DGI 19  Time 8    Period Weeks    Status Unable to assess      PT LONG TERM GOAL #4   Title Pt will demonstrate improved vestibular function and use of VOR as indicated by 2 line difference on DVA    Baseline 4 line difference (8,4)    Time 8    Period Weeks    Status Unable to assess      PT LONG TERM GOAL #5   Title Pt will demonstrate ability to maintain balance when vision removed as indicated by increase in time to 15 seconds on MCTSIB condition 2 and 4    Baseline 4-5 seconds maximum    Time 8    Period Weeks    Status Unable to assess            New goals for recertification:  PT Short Term Goals - 03/10/21 2035       PT SHORT TERM GOAL #1   Title = LTG             PT Long Term Goals - 03/10/21 2007       PT LONG TERM GOAL #1   Title Patient to score >48 on BERG    Baseline Initial BERG score 43    Time 4    Period Weeks    Status Revised    Target Date 04/09/21      PT LONG TERM GOAL #2   Title Patient to demo I in final HEP     Baseline ongoing    Time 4    Period Weeks    Status Revised    Target Date 04/09/21      PT LONG TERM GOAL #3   Title Patient to score 22 on DGI    Baseline Initial DGI 19    Time 4    Period Weeks    Status Revised    Target Date 04/09/21      PT LONG TERM GOAL #4   Title Pt will demonstrate improved vestibular function and use of VOR as indicated by 2 line difference on DVA    Baseline 4 line difference (8,4)    Time 4    Period Weeks    Status Revised    Target Date 04/09/21      PT LONG TERM GOAL #5   Title Pt will demonstrate ability to maintain balance when vision removed as indicated by increase in time to 15 seconds on MCTSIB condition 2 and 4    Baseline 4-5 seconds maximum    Time 4    Period Weeks    Status Revised    Target Date 04/09/21      Additional Long Term Goals   Additional Long Term Goals Yes      PT LONG TERM GOAL #6   Title Pt will demonstrate negative positional testing for R and L side and report no dizziness/vertigo with rolling or supine <> sit    Baseline R BPPV    Time 4    Period Weeks    Status New    Target Date 04/09/21               Plan - 03/10/21 2022     Clinical Impression Statement Pt was making good progress towards goals and then following recent COVID infection patient experienced a significant return in her dizziness and new onset of vertigo.  Treatment session today focused on re-assessment of vestibular system with pt reporting  vertigo and demonstrating upbeating, R rotary nystagmus during positional testing that improved after CRM.  Added Brandt-Daroff to HEP for habituation.  Unable to formally assess LTG today due to new BPPV; if BPPV has resolved at next visit will perform re-assessment of LTG.  Pt will likely benefit from continued skilled PT services to address ongoing vestibular, balance and gait impairments to maximize functional mobility independence and decrease falls risk.    Personal Factors and Comorbidities  Comorbidity 3+    Comorbidities MI, Rt TKA, cancer    Examination-Activity Limitations Locomotion Level;Stand;Bed Mobility    Examination-Participation Restrictions Cleaning;Shop;Community Activity;Yard Work;Interpersonal Relationship    Stability/Clinical Decision Making --    Rehab Potential Good    PT Frequency 2x / week    PT Duration 4 weeks    PT Treatment/Interventions ADLs/Self Care Home Management;Therapeutic activities;Patient/family education;Balance training;Gait training;Neuromuscular re-education;DME Instruction;Therapeutic exercise;Vestibular;Stair training;Functional mobility training;Canalith Repostioning    PT Next Visit Plan Add more visits through end of Aug.  Did she get a cane for outside?  Re-assess for R BPPV and treat as indicated; how is she doing with Brandt-Daroff?  Check LTG and update goals. Progress HEP focusing on gaze adaptation, habituation to turning/head turns/bending down to ground; sensory integration with corner balance - especially EC.  Medicine ball chops on foam for - Golf.  Bending down with ball between legs with turns/compliant surface.    Consulted and Agree with Plan of Care Patient             Patient will benefit from skilled therapeutic intervention in order to improve the following deficits and impairments:  Decreased mobility, Decreased balance, Dizziness, Difficulty walking, Impaired vision/preception, Impaired sensation, Decreased activity tolerance  Visit Diagnosis: Unsteadiness on feet  Other abnormalities of gait and mobility  Dizziness and giddiness  BPPV (benign paroxysmal positional vertigo), right     Problem List Patient Active Problem List   Diagnosis Date Noted   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   CPAP (continuous positive airway pressure) dependence 01/20/2021   Unsteady gait when walking 01/20/2021   Complaint related to dreams 70/62/3762   Metabolic acidosis with  increased anion gap and accumulation of organic acids 08/17/2018   Nausea, vomiting, and diarrhea 08/17/2018   AKI (acute kidney injury) ([place]) 08/17/2018   Overweight (BMI 25.0-29.9) 07/27/2018   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/27/2018   S/P right TKA 07/26/2018   S/P knee replacement 07/26/2018   UARS (upper airway resistance syndrome) 05/30/2018   Hyperlipidemia 05/02/2018   Leg injury, right, initial encounter 01/06/2017   Contusion of right lower leg 01/06/2017   Hypoxemia 12/23/2015   Statin myopathy 05/20/2015   MCI (mild cognitive impairment) 05/20/2015   Edema 05/17/2015   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

## 2021-03-10 NOTE — Patient Instructions (Signed)
Habituation - Sit to Side-Lying   Sit on edge of bed. Lie down onto the right side and hold until dizziness stops, plus 20 seconds.  Return to sitting and wait until dizziness stops, plus 20 seconds.  Repeat to the left side. Repeat sequence 3 times per session. Do 2 sessions per day.  Copyright  VHI. All rights reserved.

## 2021-03-13 ENCOUNTER — Ambulatory Visit: Payer: Medicare Other | Admitting: Physical Therapy

## 2021-03-13 ENCOUNTER — Other Ambulatory Visit: Payer: Self-pay

## 2021-03-13 DIAGNOSIS — Z0000 Encounter for general adult medical examination without abnormal findings: Secondary | ICD-10-CM | POA: Diagnosis not present

## 2021-03-13 DIAGNOSIS — R0902 Hypoxemia: Secondary | ICD-10-CM | POA: Diagnosis not present

## 2021-03-13 DIAGNOSIS — R2689 Other abnormalities of gait and mobility: Secondary | ICD-10-CM

## 2021-03-13 DIAGNOSIS — N1832 Chronic kidney disease, stage 3b: Secondary | ICD-10-CM | POA: Diagnosis not present

## 2021-03-13 DIAGNOSIS — I119 Hypertensive heart disease without heart failure: Secondary | ICD-10-CM | POA: Diagnosis not present

## 2021-03-13 DIAGNOSIS — I2510 Atherosclerotic heart disease of native coronary artery without angina pectoris: Secondary | ICD-10-CM | POA: Diagnosis not present

## 2021-03-13 DIAGNOSIS — H8111 Benign paroxysmal vertigo, right ear: Secondary | ICD-10-CM

## 2021-03-13 DIAGNOSIS — E785 Hyperlipidemia, unspecified: Secondary | ICD-10-CM | POA: Diagnosis not present

## 2021-03-13 DIAGNOSIS — C50919 Malignant neoplasm of unspecified site of unspecified female breast: Secondary | ICD-10-CM | POA: Diagnosis not present

## 2021-03-13 DIAGNOSIS — W19XXXS Unspecified fall, sequela: Secondary | ICD-10-CM | POA: Diagnosis not present

## 2021-03-13 DIAGNOSIS — M109 Gout, unspecified: Secondary | ICD-10-CM | POA: Diagnosis not present

## 2021-03-13 DIAGNOSIS — R42 Dizziness and giddiness: Secondary | ICD-10-CM | POA: Diagnosis not present

## 2021-03-13 DIAGNOSIS — Z1331 Encounter for screening for depression: Secondary | ICD-10-CM | POA: Diagnosis not present

## 2021-03-13 DIAGNOSIS — Z1339 Encounter for screening examination for other mental health and behavioral disorders: Secondary | ICD-10-CM | POA: Diagnosis not present

## 2021-03-13 DIAGNOSIS — R2681 Unsteadiness on feet: Secondary | ICD-10-CM

## 2021-03-13 NOTE — Patient Instructions (Addendum)
Gaze Stabilization - Tip Card  1.Target must remain in focus, not blurry, and appear stationary while head is in motion. 2.Perform exercises with small head movements (45 to either side of midline). 3.Increase speed of head motion so long as target is in focus. 4.If you wear eyeglasses, be sure you can see target through lens (therapist will give specific instructions for bifocal / progressive lenses). 5.These exercises may provoke dizziness or nausea. Work through these symptoms. If too dizzy, slow head movement slightly. Rest between each exercise. 6.Exercises demand concentration; avoid distractions. 7.For safety, perform standing exercises close to a counter, wall, corner, or next to someone.  Copyright  VHI. All rights reserved.   Gaze Stabilization - Standing Feet Apart   Feet shoulder width apart, keeping eyes on target on wall 3 feet away, tilt head down slightly and move head side to side for 60 seconds. Rest. Repeat while moving head up and down for 60 seconds. Steady pace, try to keep the body as still as possible Do 2-3 sessions per day.  2. Bending to Pick up   Stand with feet apart and an object resting on a low chair/ottoman 2 feet off the ground.  Face the object - look down at object, reach down and pick up, standing back up.  Look down and put it back down.  Repeat 5 times.    Turn so the object is to the RIGHT.  Using your LEFT hand, reach across and pick up the object.  Look back over to the right and put it back down.  Repeat 5 times with object to the right.    Turn around so the object is to the LEFT.  Repeat using your RIGHT hand, 5 times.   3.   Feet Together, NO Head Motion - Eyes Closed      With eyes closed and feet together.  Place one finger on a chair in front of you.  Close your eyes and focus on keeping your balance for 10-15 seconds. Repeat 2 times per session. Do 2 sessions per day. NO HEAD TURNS   Feet Apart, Head Motion - Eyes  Closed   With eyes closed and feet shoulder width apart, one finger touching a chair in front of your. Move head slowly, up and down 10 times. Repeat moving head left and right, slowly, 10 times. Repeat 2 times per session. Do 2 sessions per day.     How to Perform the Epley Maneuver The Epley maneuver is an exercise that relieves symptoms of vertigo. Vertigo is the feeling that you or your surroundings are moving when they are not. When you feel vertigo, you may feel like the room is spinning and have trouble walking. Dizziness is a little different than vertigo. When you are dizzy, you may feel unsteady or light-headed. You can do this maneuver at home whenever you have symptoms of vertigo. You can do it up to 3 times a day until your symptoms go away. Even though the Epley maneuver may relieve your vertigo for a few weeks, it is possible that your symptoms will return. This maneuver relieves vertigo, but it does not relieve dizziness. What are the risks? If it is done correctly, the Epley maneuver is considered safe. Sometimes it can lead to dizziness or nausea that goes away after a short time. If you develop other symptoms, such as changes in vision, weakness, or numbness, stop doing the maneuver and call your health care provider. How to perform the Epley  maneuver Sit on the edge of a bed or table with your back straight and your legs extended or hanging over the edge of the bed or table. Turn your head halfway toward the affected ear or side. Lie backward quickly with your head turned until you are lying flat on your back. You may want to position a pillow under your shoulders. Hold this position for 30 seconds. You may experience an attack of vertigo. This is normal. Turn your head to the opposite direction until your unaffected ear is facing the floor. Hold this position for 30 seconds. You may experience an attack of vertigo. This is normal. Hold this position until the vertigo  stops. Turn your whole body to the same side as your head. Hold for another 30 seconds. Sit back up. You can repeat this exercise up to 3 times a day. Follow these instructions at home: After doing the Epley maneuver, you can return to your normal activities. Ask your health care provider if there is anything you should do at home to prevent vertigo. He or she may recommend that you: Keep your head raised (elevated) with two or more pillows while you sleep. Do not sleep on the side of your affected ear. Get up slowly from bed. Avoid sudden movements during the day. Avoid extreme head movement, like looking up or bending over. Contact a health care provider if: Your vertigo gets worse. You have other symptoms, including: Nausea. Vomiting. Headache. Get help right away if: You have vision changes. You have a severe or worsening headache or neck pain. You cannot stop vomiting. You have new numbness or weakness in any part of your body. Summary Vertigo is the feeling that you or your surroundings are moving when they are not. The Epley maneuver is an exercise that relieves symptoms of vertigo. If the Epley maneuver is done correctly, it is considered safe. You can do it up to 3 times a day. This information is not intended to replace advice given to you by your health care provider. Make sure you discuss any questions you have with your health care provider. Document Released: 08/08/2013 Document Revised: 06/23/2016 Document Reviewed: 06/23/2016 Elsevier Interactive Patient Education  2017 [employer].

## 2021-03-13 NOTE — Therapy (Signed)
[facility] [address] [place], [place], [address] Phone: [phone]   Fax:  [phone]  Physical Therapy Treatment  Patient Details  Name: Tracey [name] MRN: [id number] Date of Birth: 09-21-1945 Referring Provider (PT): Dr. Asencion Partridge Dohmeier   Encounter Date: 03/13/2021   PT End of Session - 03/13/21 1026     Visit Number 7    Number of Visits 14    Date for PT Re-Evaluation 04/09/21    Authorization Type Medicare A/B; [employer]    Progress Note Due on Visit 10    PT Start Time 1023    PT Stop Time 1105    PT Time Calculation (min) 42 min    Activity Tolerance Patient tolerated treatment well    Behavior During Therapy WFL for tasks assessed/performed             Past Medical History:  Diagnosis Date   Allergy    Anxiety    takes Ativan daily prn   Arthritis    Breast cancer ([place]) 2014   ER+/PR+/HEr2-,    Bruises easily    Colon polyps    2 polyps by report   Diverticulosis    GERD (gastroesophageal reflux disease)    occasionally takes Nexium    Gout    takes Allopurinol daily and Colchicine daily prn;last attack 10yr ago   Gout    History of bladder infections    many yrs ago   History of bronchitis    last time at least 889yrago   History of stress incontinence    Hyperlipidemia    takes Pravastatin daily   Insomnia    takes Ambien nightly prn   NSTEMI (non-ST elevated [place] infarction) [place]   OSA (obstructive sleep apnea)    on CPAP   Osteoarthritis    Peripheral edema    takes Furosemide daily prn   PONV (postoperative nausea and vomiting)    Postmenopausal hormone therapy    Radiation 07/27/13-09/07/13   Right Breast x 31 treatments   Rhinitis    uses Flonase prn   Sinus congestion    Status post breast reconstruction 10/15/14   Bilateral implant removal and DIEP performed in [place]   Tachycardia    takes Metoprolol daily   Ventricular tachycardia,  non-sustained ([facility]   during sleep study 2009 with normal cardiac workup    Past Surgical History:  Procedure Laterality Date   ABDOMINAL HYSTERECTOMY     with BSO   APPENDECTOMY     AXILLARY SENTINEL NODE BIOPSY Right 05/23/2013   Procedure: AXILLARY SENTINEL NODE BIOPSY;  Surgeon: ToOdis HollingsheadMD;  [facility] Service: General;  Laterality: Right;  nuc med injection 7:00   BREAST RECONSTRUCTION WITH PLACEMENT OF TISSUE EXPANDER AND FLEX HD (ACELLULAR HYDRATED DERMIS) Bilateral 05/23/2013   Procedure: BILATERAL BREAST RECONSTRUCTION WITH PLACEMENT OF TISSUE EXPANDER AND FLEX HD;  Surgeon: DaCrissie ReeseMD;  [facility]: [facility];  Laterality: Bilateral;   CARDIAC CATHETERIZATION  1999   CATARACT EXTRACTION, BILATERAL Bilateral    CHOLECYSTECTOMY     COSMETIC SURGERY     OTHER SURGICAL HISTORY  09/2017   had cyst removal from spine    PERCUTANEOUS CORONARY STENT INTERVENTION (PCI-S)  08/2017   [place] Bilateral 05/17/2014   Procedure: REMOVAL OF BILATERAL TISSUE EXPANDERS WITH PLACEMENT OF  BILATERAL BREAST IMPLANTS FOR RECONSTRUCTION;  Surgeon: [name], MD;  Location: [facility];  Service: Plastics;  Laterality: Bilateral;   TONSILLECTOMY     TOTAL KNEE ARTHROPLASTY Right 07/26/2018   Procedure: TOTAL KNEE ARTHROPLASTY;  Surgeon: Paralee Cancel, MD;  Location: [facility];  Service: Orthopedics;  Laterality: Right;  70 mins   TOTAL MASTECTOMY Bilateral 05/23/2013   Procedure: TOTAL MASTECTOMY;  Surgeon: [name], MD;  Location: [facility];  Service: General;  Laterality: Bilateral;    There were no vitals filed for this visit.   Subjective Assessment - 03/13/21 1027     Subjective Feeling wonderful today.  Yolanda Bonine is coming today.  When she does her Brandt-Daroff exercise she still feels spinning when lying on R side.    Patient is accompained by: Family member     Pertinent History Breast Cancer 2014, MI, Rt TKA    Limitations Walking    How long can you sit comfortably? unlimited    How long can you stand comfortably? unlimited    How long can you walk comfortably? unlimited    Patient Stated Goals To become more stable with her balance    Currently in Pain? No/denies               Vestibular Assessment - 03/13/21 1029       Positional Testing   Dix-Hallpike Dix-Hallpike Right    Sidelying Test Sidelying Right;Sidelying Left      Dix-Hallpike Right   Dix-Hallpike Right Duration 5    Dix-Hallpike Right Symptoms Upbeat, right rotatory nystagmus      Sidelying Right   Sidelying Right Duration 5    Sidelying Right Symptoms Upbeat, right rotatory nystagmus      Sidelying Left   Sidelying Left Duration 0    Sidelying Left Symptoms No nystagmus                       Vestibular Treatment/Exercise - 03/13/21 1036       Vestibular Treatment/Exercise   Vestibular Treatment Provided Canalith Repositioning    Canalith Repositioning Semont Procedure Right Posterior       EPLEY MANUEVER RIGHT   Number of Reps  2    Overall Response Symptoms Resolved    Response Details  Performed modified with pillow behind back so pt can perform at home with daughter Investment banker, corporate) assistance      Semont Procedure Right Posterior   Number of Reps  1    Overall Response  No change                   PT Education - 03/13/21 1126     Education Details anatomy of inner ear and mechanism of BPPV; home Epley maneuver with pillow, plan to add more visits to continue to address balance    Person(s) Educated Patient    Methods Explanation;Demonstration;Handout    Comprehension Verbalized understanding;Returned demonstration              PT Short Term Goals - 03/10/21 2035       PT SHORT TERM GOAL #1   Title = LTG               PT Long Term Goals - 03/10/21 2007       PT LONG TERM GOAL #1   Title Patient to score >48 on  BERG    Baseline Initial BERG score 43    Time 4    Period Weeks  Status Revised    Target Date 04/09/21      PT LONG TERM GOAL #2   Title Patient to demo I in final HEP    Baseline ongoing    Time 4    Period Weeks    Status Revised    Target Date 04/09/21      PT LONG TERM GOAL #3   Title Patient to score 22 on DGI    Baseline Initial DGI 19    Time 4    Period Weeks    Status Revised    Target Date 04/09/21      PT LONG TERM GOAL #4   Title Pt will demonstrate improved vestibular function and use of VOR as indicated by 2 line difference on DVA    Baseline 4 line difference (8,4)    Time 4    Period Weeks    Status Revised    Target Date 04/09/21      PT LONG TERM GOAL #5   Title Pt will demonstrate ability to maintain balance when vision removed as indicated by increase in time to 15 seconds on MCTSIB condition 2 and 4    Baseline 4-5 seconds maximum    Time 4    Period Weeks    Status Revised    Target Date 04/09/21      Additional Long Term Goals   Additional Long Term Goals Yes      PT LONG TERM GOAL #6   Title Pt will demonstrate negative positional testing for R and L side and report no dizziness/vertigo with rolling or supine <> sit    Baseline R BPPV    Time 4    Period Weeks    Status New    Target Date 04/09/21                   Plan - 03/13/21 1116     Clinical Impression Statement Still unable to formally re-assess LTG today due to ongoing positional vertigo.  Performed Semont manuever x1 with no change in symptoms.  Changed to modified Epley with pillow behind back so that pt can perform at home with daughter if needed using handout.  Full resolution of vertigo and nystagmus after one modified Epley.  Pt experiencing mild disequilibrium when ambulating and required supervision-min A to ambulate safely.  Will continue to address and progress towards LTG.    Personal Factors and Comorbidities Comorbidity 3+    Comorbidities MI, Rt TKA,  cancer    Examination-Activity Limitations Locomotion Level;Stand;Bed Mobility    Examination-Participation Restrictions Cleaning;Shop;Community Activity;Yard Work;Interpersonal Relationship    Rehab Potential Good    PT Frequency 2x / week    PT Duration 4 weeks    PT Treatment/Interventions ADLs/Self Care Home Management;Therapeutic activities;Patient/family education;Balance training;Gait training;Neuromuscular re-education;DME Instruction;Therapeutic exercise;Vestibular;Stair training;Functional mobility training;Canalith Repostioning    PT Next Visit Plan How is her dizziness?  Treat R BPPV if returned. Check LTG and update goals. Progress HEP focusing on gaze adaptation, habituation to turning/head turns/bending down to ground; sensory integration with corner balance - especially EC.  Medicine ball chops on foam for - Golf.  Bending down with ball between legs with turns/compliant surface.    Consulted and Agree with Plan of Care Patient             Patient will benefit from skilled therapeutic intervention in order to improve the following deficits and impairments:  Decreased mobility, Decreased balance, Dizziness, Difficulty walking, Impaired vision/preception, Impaired sensation,  Decreased activity tolerance  Visit Diagnosis: Unsteadiness on feet  Other abnormalities of gait and mobility  Dizziness and giddiness  BPPV (benign paroxysmal positional vertigo), right     Problem List Patient Active Problem List   Diagnosis Date Noted   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   CPAP (continuous positive airway pressure) dependence 01/20/2021   Unsteady gait when walking 01/20/2021   Complaint related to dreams 08/09/8249   Metabolic acidosis with increased anion gap and accumulation of organic acids 08/17/2018   Nausea, vomiting, and diarrhea 08/17/2018   AKI (acute kidney injury) ([facility]) 08/17/2018   Overweight (BMI 25.0-29.9) 07/27/2018    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/27/2018   S/P right TKA 07/26/2018   S/P knee replacement 07/26/2018   UARS (upper airway resistance syndrome) 05/30/2018   Hyperlipidemia 05/02/2018   Leg injury, right, initial encounter 01/06/2017   Contusion of right lower leg 01/06/2017   Hypoxemia 12/23/2015   Statin myopathy 05/20/2015   MCI (mild cognitive impairment) 05/20/2015   Edema 05/17/2015   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

## 2021-03-14 ENCOUNTER — Other Ambulatory Visit: Payer: Self-pay | Admitting: Internal Medicine

## 2021-03-14 DIAGNOSIS — N1832 Chronic kidney disease, stage 3b: Secondary | ICD-10-CM

## 2021-03-17 ENCOUNTER — Ambulatory Visit: Payer: Medicare Other | Attending: Neurology | Admitting: Physical Therapy

## 2021-03-17 ENCOUNTER — Other Ambulatory Visit: Payer: Self-pay

## 2021-03-17 ENCOUNTER — Ambulatory Visit
Admission: RE | Admit: 2021-03-17 | Discharge: 2021-03-17 | Disposition: A | Discharge status: Home / Self Care | Payer: Medicare Other | Source: Ambulatory Visit | Attending: Internal Medicine | Admitting: Internal Medicine

## 2021-03-17 ENCOUNTER — Other Ambulatory Visit: Payer: Self-pay | Admitting: Internal Medicine

## 2021-03-17 DIAGNOSIS — R42 Dizziness and giddiness: Secondary | ICD-10-CM

## 2021-03-17 DIAGNOSIS — H8111 Benign paroxysmal vertigo, right ear: Secondary | ICD-10-CM

## 2021-03-17 DIAGNOSIS — R2689 Other abnormalities of gait and mobility: Secondary | ICD-10-CM | POA: Diagnosis not present

## 2021-03-17 DIAGNOSIS — N1832 Chronic kidney disease, stage 3b: Secondary | ICD-10-CM

## 2021-03-17 DIAGNOSIS — R2681 Unsteadiness on feet: Secondary | ICD-10-CM | POA: Diagnosis not present

## 2021-03-17 DIAGNOSIS — W19XXXS Unspecified fall, sequela: Secondary | ICD-10-CM

## 2021-03-17 DIAGNOSIS — N189 Chronic kidney disease, unspecified: Secondary | ICD-10-CM | POA: Diagnosis not present

## 2021-03-17 IMAGING — US US RENAL
1 series · 14 of 25 positions shown · non-contrast
Comparison: None.

CLINICAL DATA: 75-year-old female with chronic kidney disease.

EXAM:
RENAL / URINARY TRACT ULTRASOUND COMPLETE

[Series 1: us renal · 0.22mm/px · 14 of 33 slices shown]
[im 1/33]
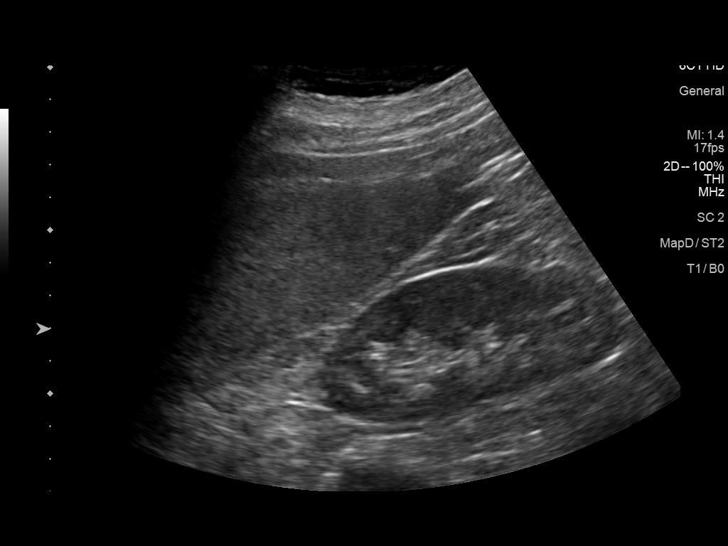
[im 3/33]
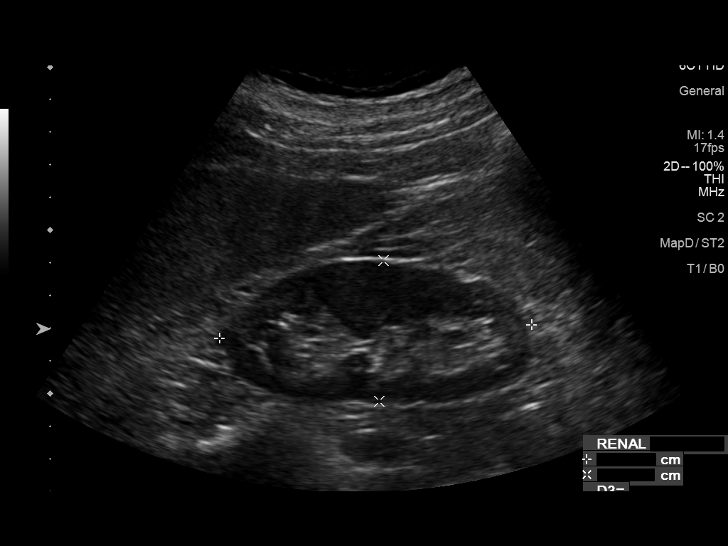
[im 6/33]
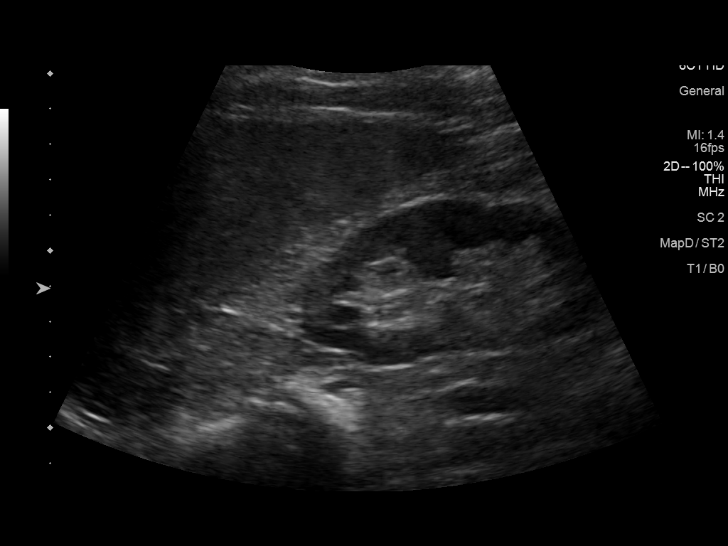
[im 9/33]
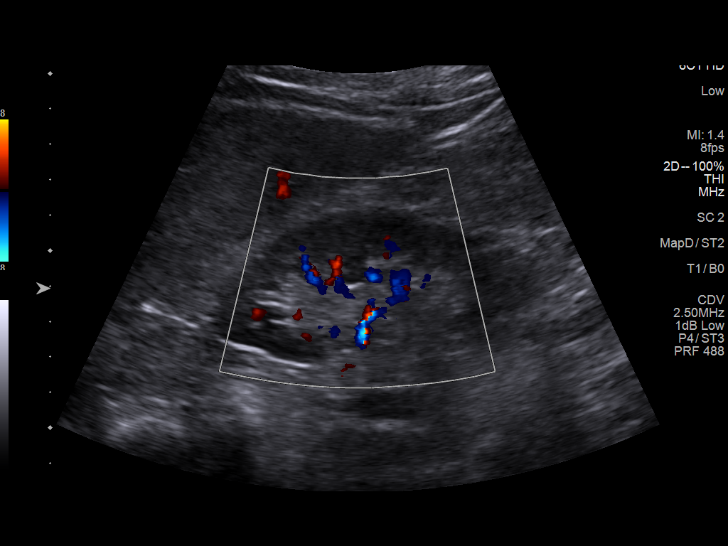
[im 11/33]
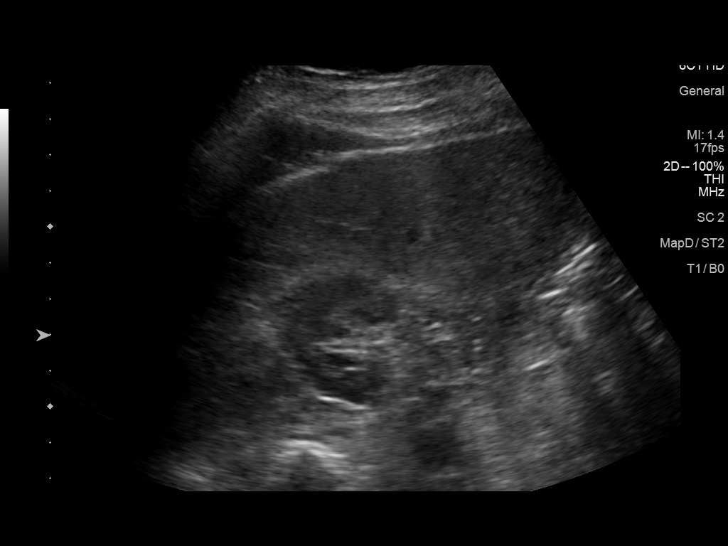
[im 13/33]
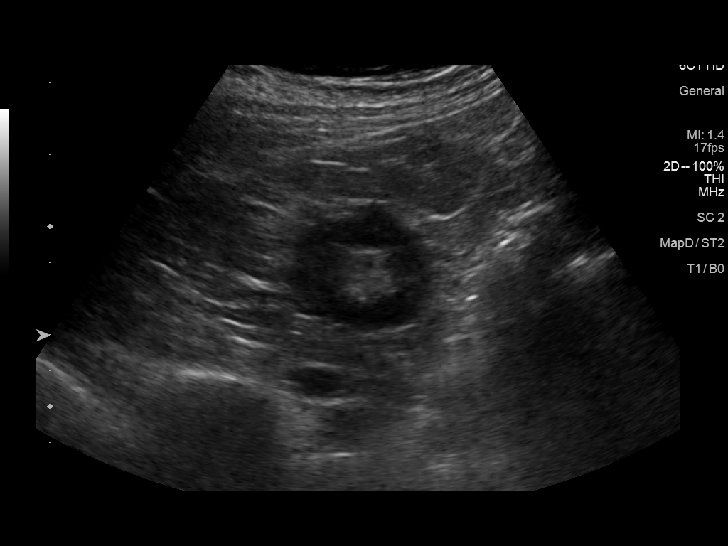
[im 15/33]
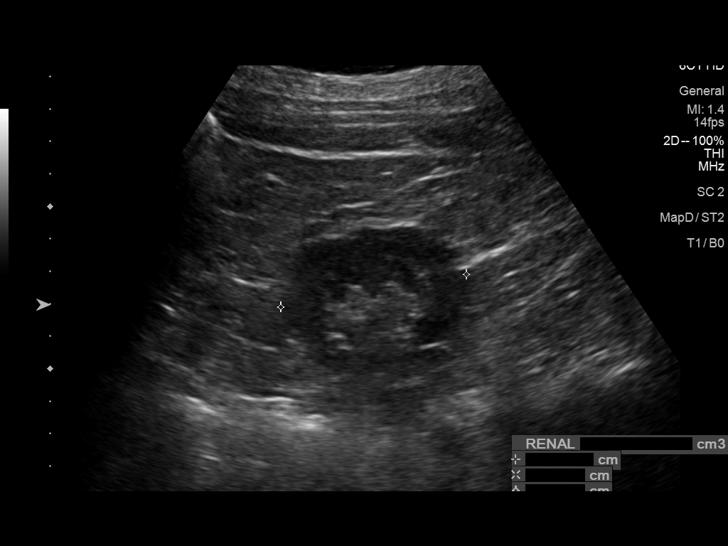
[im 18/33]
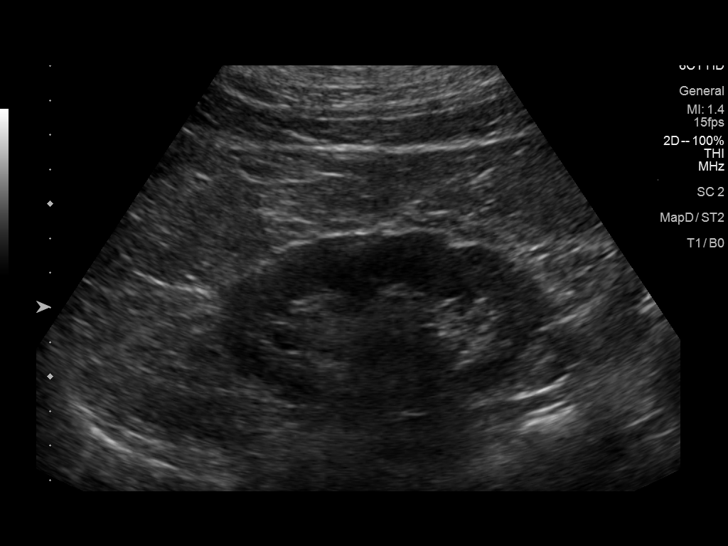
[im 21/33]
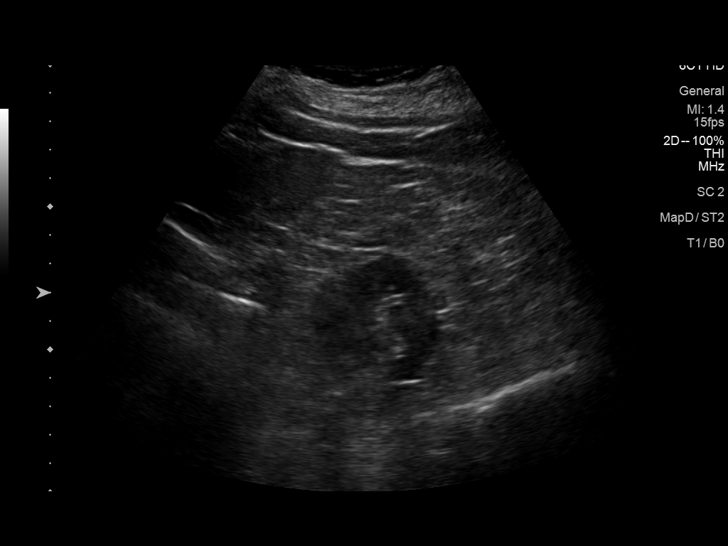
[im 22/33]
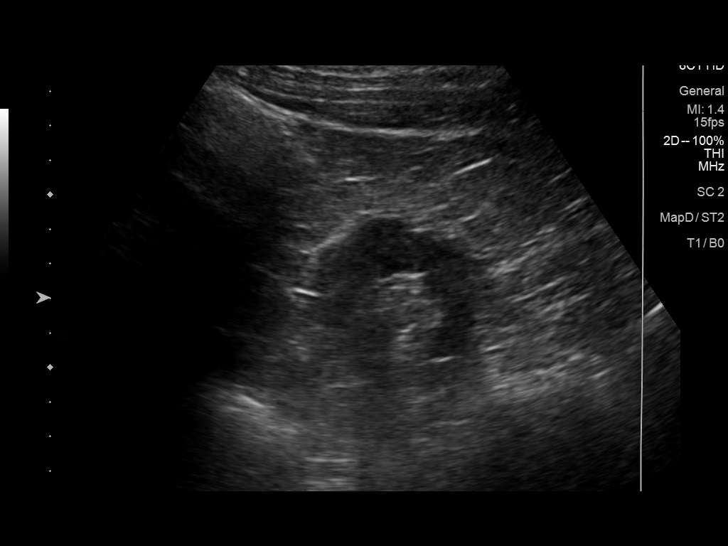
[im 25/33]
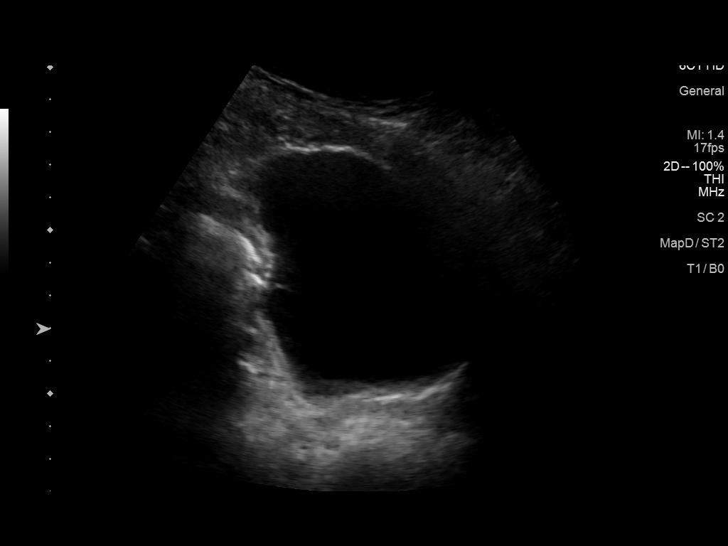
[im 27/33]
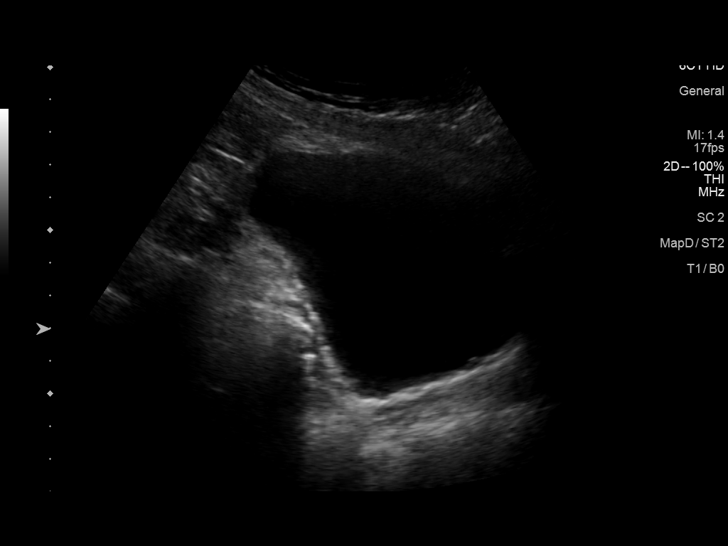
[im 30/33]
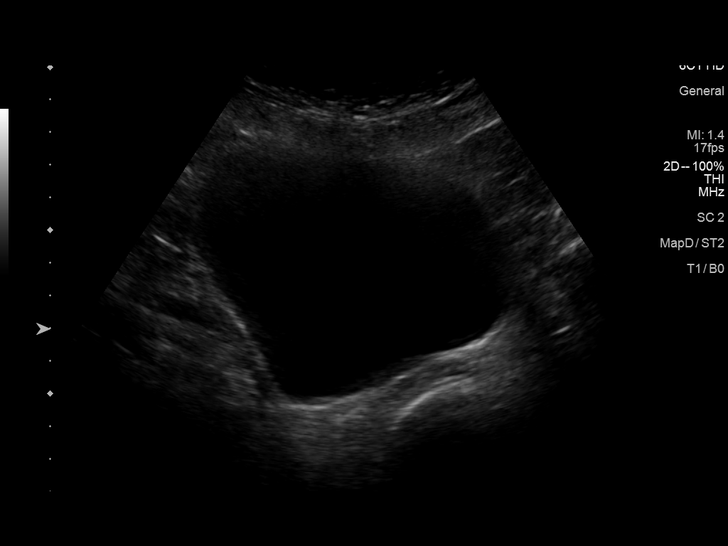
[im 33/33]
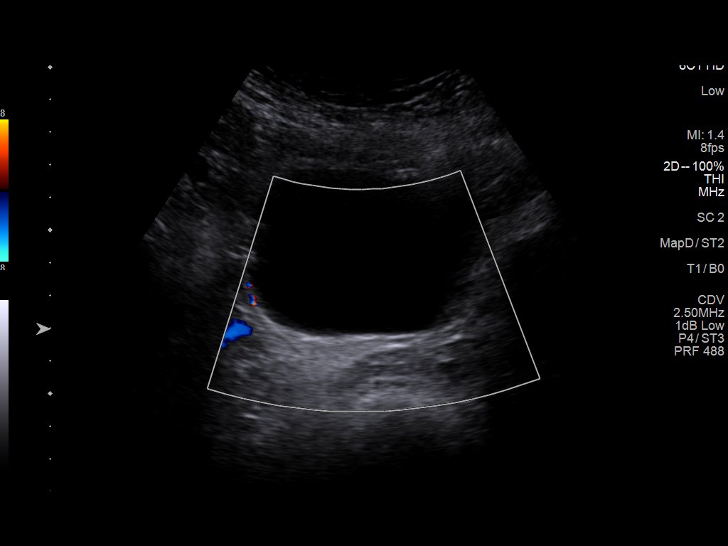

[14 of 25 positions shown; findings below may reference images not displayed]

FINDINGS: Right Kidney:

Renal measurements: 9.6 x 4.3 x 4.4 cm = volume: 95 mL. Echogenicity
within normal limits. No mass or hydronephrosis visualized.

Left Kidney:

Renal measurements: 10.7 x 4.8 x 5.8 cm = volume: 154 mL.
Echogenicity within normal limits. No mass or hydronephrosis
visualized.

Bladder:

Appears normal for degree of bladder distention.

Other:

None.
IMPRESSION: Normal sonographic appearance of the kidneys bilaterally.

## 2021-03-17 NOTE — Therapy (Signed)
[facility] [address] [place], [place], [address] Phone: [phone]   Fax:  [phone]  Physical Therapy Treatment  Patient Details  Name: Tracey [name] MRN: [id number] Date of Birth: 08/21/1945 Referring Provider (PT): Dr. Asencion Partridge Dohmeier   Encounter Date: 03/17/2021   PT End of Session - 03/17/21 1238     Visit Number 8    Number of Visits 14    Date for PT Re-Evaluation 04/09/21    Authorization Type Medicare A/B; [employer]    Progress Note Due on Visit 10    PT Start Time 7253    PT Stop Time 1319    PT Time Calculation (min) 44 min    Activity Tolerance Patient tolerated treatment well    Behavior During Therapy [facility] for tasks assessed/performed             Past Medical History:  Diagnosis Date   Allergy    Anxiety    takes Ativan daily prn   Arthritis    Breast cancer ([place]) 2014   ER+/PR+/HEr2-,    Bruises easily    Colon polyps    2 polyps by report   Diverticulosis    GERD (gastroesophageal reflux disease)    occasionally takes Nexium    Gout    takes Allopurinol daily and Colchicine daily prn;last attack 72yr ago   Gout    History of bladder infections    many yrs ago   History of bronchitis    last time at least 856yrago   History of stress incontinence    Hyperlipidemia    takes Pravastatin daily   Insomnia    takes Ambien nightly prn   [place]I (non-ST elevated myocardial infarction) [place]   OSA (obstructive sleep apnea)    on CPAP   Osteoarthritis    Peripheral edema    takes Furosemide daily prn   PONV (postoperative nausea and vomiting)    Postmenopausal hormone therapy    Radiation 07/27/13-09/07/13   Right Breast x 31 treatments   Rhinitis    uses Flonase prn   Sinus congestion    Status post breast reconstruction 10/15/14   Bilateral implant removal and DIEP performed in [place]   Tachycardia    takes Metoprolol daily   Ventricular tachycardia,  non-sustained ([facility]   during sleep study 2009 with normal cardiac workup    Past Surgical History:  Procedure Laterality Date   ABDOMINAL HYSTERECTOMY     with BSO   APPENDECTOMY     AXILLARY SENTINEL NODE BIOPSY Right 05/23/2013   Procedure: AXILLARY SENTINEL NODE BIOPSY;  Surgeon: ToOdis HollingsheadMD;  [facility] Service: General;  Laterality: Right;  nuc med injection 7:00   BREAST RECONSTRUCTION WITH PLACEMENT OF TISSUE EXPANDER AND FLEX HD (ACELLULAR HYDRATED DERMIS) Bilateral 05/23/2013   Procedure: BILATERAL BREAST RECONSTRUCTION WITH PLACEMENT OF TISSUE EXPANDER AND FLEX HD;  Surgeon: DaCrissie ReeseMD;  [facility] Service: Plastics;  Laterality: Bilateral;   CARDIAC CATHETERIZATION  1999   CATARACT EXTRACTION, BILATERAL Bilateral    CHOLECYSTECTOMY     COSMETIC SURGERY     OTHER SURGICAL HISTORY  09/2017   had cyst removal from spine    PERCUTANEOUS CORONARY STENT INTERVENTION (PCI-S)  08/2017   [place] Bilateral 05/17/2014   Procedure: REMOVAL OF BILATERAL TISSUE EXPANDERS WITH PLACEMENT OF  BILATERAL BREAST IMPLANTS FOR RECONSTRUCTION;  Surgeon: [name], MD;  Location: [facility];  Service: Plastics;  Laterality: Bilateral;   TONSILLECTOMY     TOTAL KNEE ARTHROPLASTY Right 07/26/2018   Procedure: TOTAL KNEE ARTHROPLASTY;  Surgeon: Paralee Cancel, MD;  Location[facility];  Service: Orthopedics;  Laterality: Right;  70 mins   TOTAL MASTECTOMY Bilateral 05/23/2013   Procedure: TOTAL MASTECTOMY;  Surgeon: [name], MD;  Location: [facility];  Service: General;  Laterality: Bilateral;    There were no vitals filed for this visit.   Subjective Assessment - 03/17/21 1239     Subjective Grandson made it in, had a good visit.  Daughter here for 10 days.  Went to have kidney sonogram this morning and when they laid her back she had acute vertigo.    Patient is accompained by:  Family member    Pertinent History Breast Cancer 2014, MI, Rt TKA    Limitations Walking    How long can you sit comfortably? unlimited    How long can you stand comfortably? unlimited    How long can you walk comfortably? unlimited    Patient Stated Goals To become more stable with her balance    Currently in Pain? No/denies                [facility] PT Assessment - 03/17/21 1306       Standardized Balance Assessment   Standardized Balance Assessment Berg Balance Test;Dynamic Gait Index      Berg Balance Test   Sit to Stand Able to stand without using hands and stabilize independently    Standing Unsupported Able to stand safely 2 minutes    Sitting with Back Unsupported but Feet Supported on Floor or Stool Able to sit safely and securely 2 minutes    Stand to Sit Sits safely with minimal use of hands    Transfers Able to transfer safely, minor use of hands    Standing Unsupported with Eyes Closed Able to stand 10 seconds safely    Standing Unsupported with Feet Together Able to place feet together independently and stand 1 minute safely    From Standing, Reach Forward with Outstretched Arm Can reach confidently >25 cm (10")    From Standing Position, Pick up Object from Floor Able to pick up shoe safely and easily    From Standing Position, Turn to Look Behind Over each Shoulder Looks behind one side only/other side shows less weight shift    Turn 360 Degrees Able to turn 360 degrees safely but slowly    Standing Unsupported, Alternately Place Feet on Step/Stool Able to stand independently and safely and complete 8 steps in 20 seconds    Standing Unsupported, One Foot in Front Able to plae foot ahead of the other independently and hold 30 seconds    Standing on One Leg Able to lift leg independently and hold > 10 seconds    Total Score 52    Berg comment: 52/56                 Vestibular Assessment - 03/17/21 1240       Positional Testing   Dix-Hallpike Dix-Hallpike  Right;Dix-Hallpike Left    Horizontal Canal Testing Horizontal Canal Right;Horizontal Canal Left      Dix-Hallpike Right   Dix-Hallpike Right Duration 10    Dix-Hallpike Right Symptoms Upbeat, right rotatory nystagmus      Dix-Hallpike Left   Dix-Hallpike Left Duration 0    Dix-Hallpike  Left Symptoms No nystagmus      Horizontal Canal Right   Horizontal Canal Right Duration 0    Horizontal Canal Right Symptoms Normal      Horizontal Canal Left   Horizontal Canal Left Duration 0    Horizontal Canal Left Symptoms Normal                       Vestibular Treatment/Exercise - 03/17/21 1247       Vestibular Treatment/Exercise   Vestibular Treatment Provided Canalith Repositioning    Canalith Repositioning Epley Manuever Right       EPLEY MANUEVER RIGHT   Number of Reps  3    Overall Response Symptoms Resolved    Response Details  No change after first rep; resolved after 2nd                   PT Education - 03/17/21 1644     Education Details progress towards goals    Person(s) Educated Patient    Methods Explanation    Comprehension Verbalized understanding              PT Short Term Goals - 03/10/21 2035       PT SHORT TERM GOAL #1   Title = LTG               PT Long Term Goals - 03/17/21 1639       PT LONG TERM GOAL #1   Title Patient to score >54 on BERG    Baseline 43 > 52    Time 4    Period Weeks    Status Revised    Target Date 04/09/21      PT LONG TERM GOAL #2   Title Patient to demo I in final HEP    Baseline ongoing    Time 4    Period Weeks    Status Revised    Target Date 04/09/21      PT LONG TERM GOAL #3   Title Patient to score 22 on DGI    Baseline Initial DGI 19    Time 4    Period Weeks    Status Revised    Target Date 04/09/21      PT LONG TERM GOAL #4   Title Pt will demonstrate improved vestibular function and use of VOR as indicated by 2 line difference on DVA    Baseline 4 line  difference (8,4)    Time 4    Period Weeks    Status Revised    Target Date 04/09/21      PT LONG TERM GOAL #5   Title Pt will demonstrate ability to maintain balance when vision removed as indicated by increase in time to 15 seconds on MCTSIB condition 2 and 4    Baseline 4-5 seconds maximum    Time 4    Period Weeks    Status Revised    Target Date 04/09/21      PT LONG TERM GOAL #6   Title Pt will demonstrate negative positional testing for R and L side and report no dizziness/vertigo with rolling or supine <> sit    Baseline R BPPV    Time 4    Period Weeks    Status New    Target Date 04/09/21                   Plan - 03/17/21 1640     Clinical Impression  Statement Pt demonstrates return of R BPPV today with onset this morning; treated with CRM x 2 with resolution of symptoms.  Performed re-assessment of static standing balance with BERG balance assessment with pt demonstrating improvement to 52/56 with improvements in balance during reaching, rotation and narrow BOS.  Pt has demonstrated progress despite recent illness and acute onset of vertigo.  Plan to upgrade HEP next session due to progress.    Personal Factors and Comorbidities Comorbidity 3+    Comorbidities MI, Rt TKA, cancer    Examination-Activity Limitations Locomotion Level;Stand;Bed Mobility    Examination-Participation Restrictions Cleaning;Shop;Community Activity;Yard Work;Interpersonal Relationship    Rehab Potential Good    PT Frequency 2x / week    PT Duration 4 weeks    PT Treatment/Interventions ADLs/Self Care Home Management;Therapeutic activities;Patient/family education;Balance training;Gait training;Neuromuscular re-education;DME Instruction;Therapeutic exercise;Vestibular;Stair training;Functional mobility training;Canalith Repostioning    PT Next Visit Plan Assess and treat R BPPV if symptoms return.  Progress HEP focusing on gaze adaptation, habituation to turning/head turns/bending down to  ground; sensory integration with corner balance - especially EC.  Medicine ball chops on foam for - Golf.  Bending down with ball between legs with turns/compliant surface.    Consulted and Agree with Plan of Care Patient             Patient will benefit from skilled therapeutic intervention in order to improve the following deficits and impairments:  Decreased mobility, Decreased balance, Dizziness, Difficulty walking, Impaired vision/preception, Impaired sensation, Decreased activity tolerance  Visit Diagnosis: Unsteadiness on feet  Other abnormalities of gait and mobility  Dizziness and giddiness  BPPV (benign paroxysmal positional vertigo), right     Problem List Patient Active Problem List   Diagnosis Date Noted   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   CPAP (continuous positive airway pressure) dependence 01/20/2021   Unsteady gait when walking 01/20/2021   Complaint related to dreams 19/37/9024   Metabolic acidosis with increased anion gap and accumulation of organic acids 08/17/2018   Nausea, vomiting, and diarrhea 08/17/2018   AKI (acute kidney injury) ([place]) 08/17/2018   Overweight (BMI 25.0-29.9) 07/27/2018   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/27/2018   S/P right TKA 07/26/2018   S/P knee replacement 07/26/2018   UARS (upper airway resistance syndrome) 05/30/2018   Hyperlipidemia 05/02/2018   Leg injury, right, initial encounter 01/06/2017   Contusion of right lower leg 01/06/2017   Hypoxemia 12/23/2015   Statin myopathy 05/20/2015   MCI (mild cognitive impairment) 05/20/2015   Edema 05/17/2015   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 [place], [place], [address] Phone: [phone]   Fax:  [phone]  Name: Tracey [name] MRN: [id number] Date of Birth: [birth year]

## 2021-03-21 ENCOUNTER — Encounter: Payer: Medicare Other | Admitting: Physical Therapy

## 2021-03-24 ENCOUNTER — Ambulatory Visit
Admission: RE | Admit: 2021-03-24 | Discharge: 2021-03-24 | Disposition: A | Discharge status: Home / Self Care | Payer: Medicare Other | Source: Ambulatory Visit | Attending: Internal Medicine | Admitting: Internal Medicine

## 2021-03-24 ENCOUNTER — Other Ambulatory Visit: Payer: Self-pay

## 2021-03-24 DIAGNOSIS — R42 Dizziness and giddiness: Secondary | ICD-10-CM

## 2021-03-24 DIAGNOSIS — W19XXXS Unspecified fall, sequela: Secondary | ICD-10-CM

## 2021-03-24 DIAGNOSIS — Z853 Personal history of malignant neoplasm of breast: Secondary | ICD-10-CM | POA: Diagnosis not present

## 2021-03-24 DIAGNOSIS — S0990XA Unspecified injury of head, initial encounter: Secondary | ICD-10-CM | POA: Diagnosis not present

## 2021-03-24 IMAGING — MR MR HEAD W/O CM
11 series · 48 of 48 positions shown · non-contrast
Comparison: Head injury 1 year ago.

CLINICAL DATA: Dizziness and headaches. Breast cancer. Fall,
sequela. Head injury 1 year ago.

EXAM:
MRI HEAD WITHOUT CONTRAST
TECHNIQUE: Multiplanar, multiecho pulse sequences of the brain and surrounding
structures were obtained without intravenous contrast.

[Series 5: T1 · sagittal · 4.0mm · 0.78mm/px · 1 of 29 slices shown (1 of 2)]
[im 1/29]
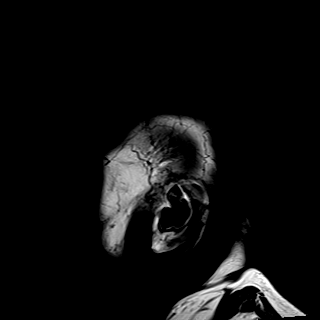

[Series 6: DWI · axial · 3.0mm · 0.98mm/px · z∈[-72,+80]mm · 11 of 175 slices shown (1 of 3)]
[im 1/175]
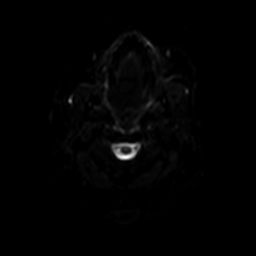
[im 18/175]
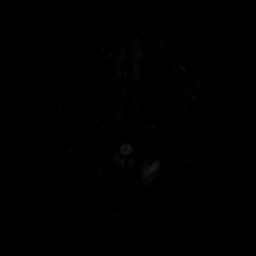
[im 35/175]
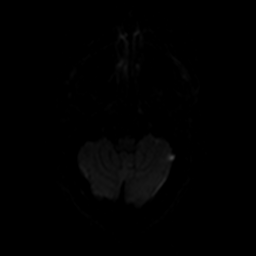
[im 53/175]
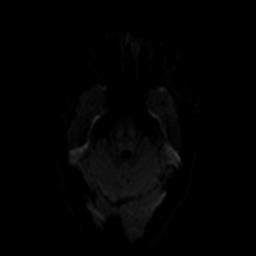
[im 70/175]
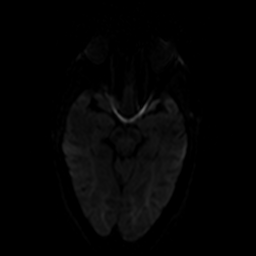
[im 88/175]
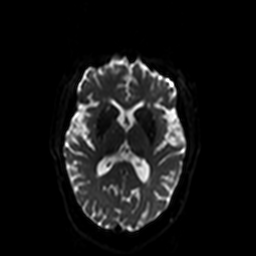
[im 105/175]
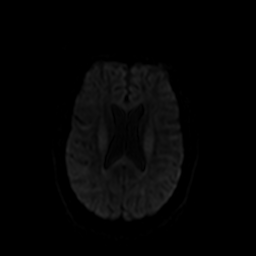
[im 122/175]
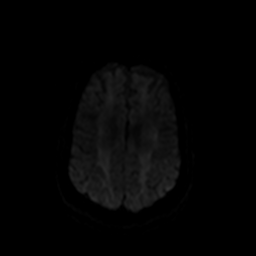
[im 140/175]
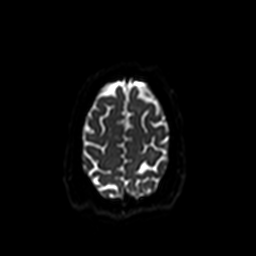
[im 157/175]
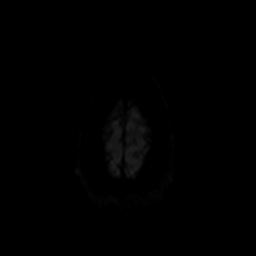
[im 175/175]
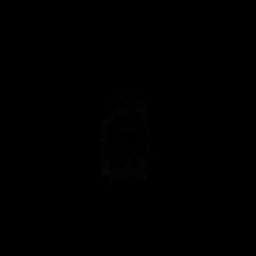

[Series 7: ax dwi_tracew · axial · 3.0mm · 0.98mm/px · z∈[-72,+80]mm · 5 of 88 slices shown]
[im 1/88]
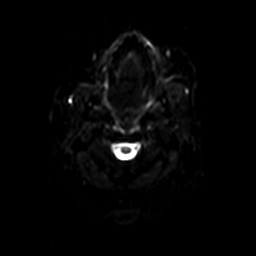
[im 22/88]
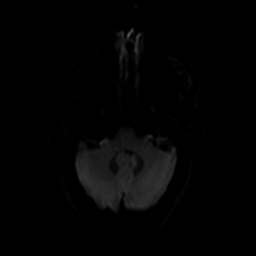
[im 44/88]
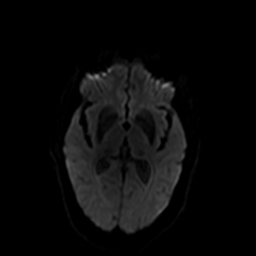
[im 66/88]
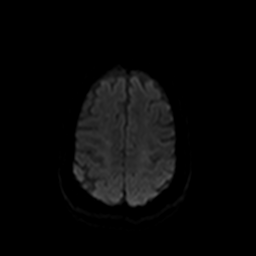
[im 88/88]
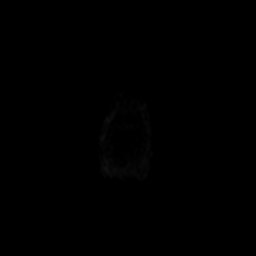

[Series 8: ax dwi_adc · axial · 3.0mm · 0.98mm/px · z∈[-72,+80]mm · 3 of 44 slices shown]
[im 1/44]
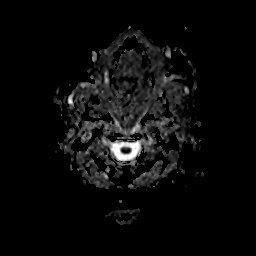
[im 22/44]
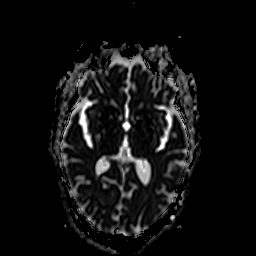
[im 44/44]
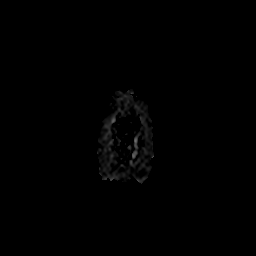

[Series 9: DWI · coronal · 5.0mm · 1.44mm/px · 4 of 66 slices shown (2 of 3)]
[im 1/66]
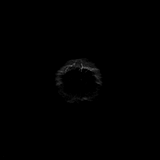
[im 22/66]
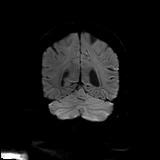
[im 44/66]
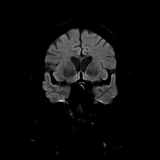
[im 66/66]
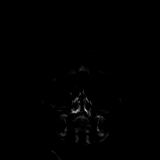

[Series 10: DWI · coronal · 5.0mm · 1.44mm/px · 2 of 33 slices shown (3 of 3)]
[im 1/33]
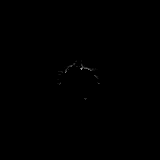
[im 33/33]
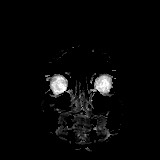

[Series 11: T2 · axial · 4.0mm · 0.39mm/px · z∈[-68,+81]mm · 2 of 30 slices shown (1 of 2)]
[im 1/30]
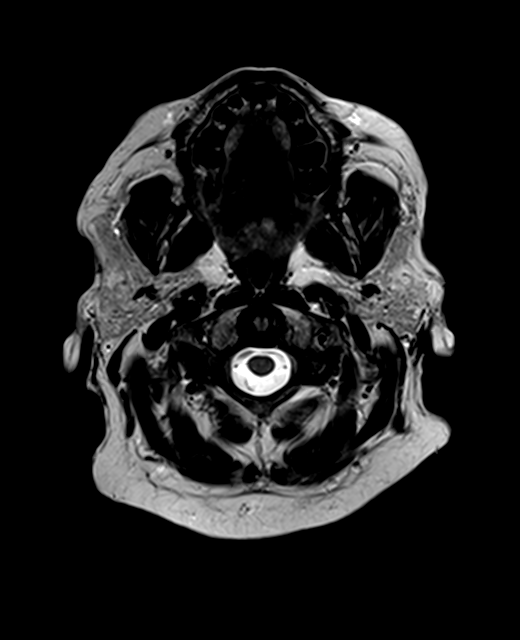
[im 30/30]
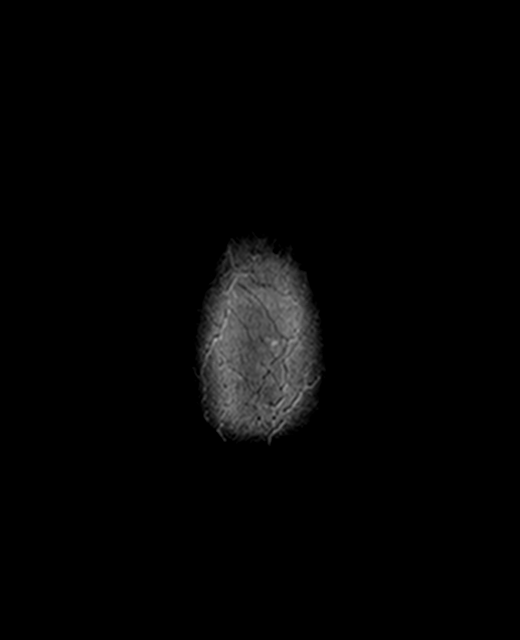

[Series 12: FLAIR · axial · 3.0mm · 0.78mm/px · z∈[-69,+79]mm · 2 of 26 slices shown]
[im 1/26]
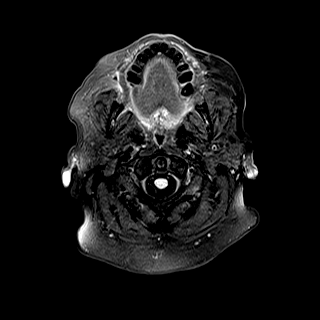
[im 26/26]
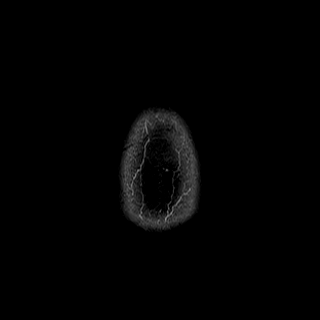

[Series 13: swi_images · axial · 1.5mm · 0.98mm/px · z∈[-70,+83]mm · 6 of 104 slices shown]
[im 1/104]
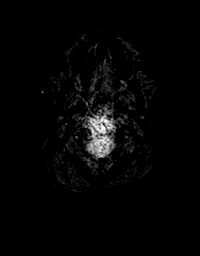
[im 21/104]
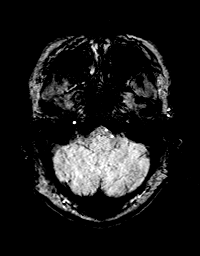
[im 42/104]
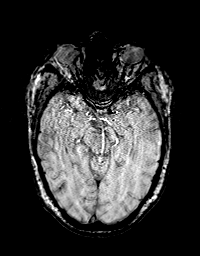
[im 62/104]
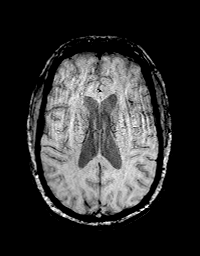
[im 83/104]
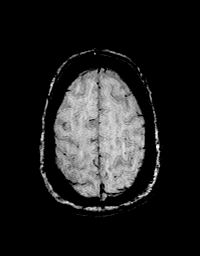
[im 104/104]
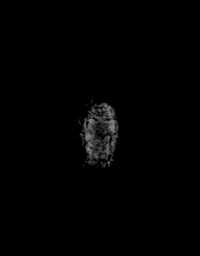

[Series 15: T1 · axial · 1.0mm · 0.98mm/px · z∈[-72,+85]mm · 10 of 160 slices shown (2 of 2)]
[im 1/160]
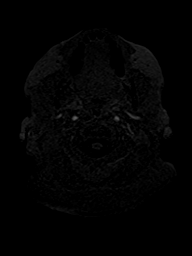
[im 18/160]
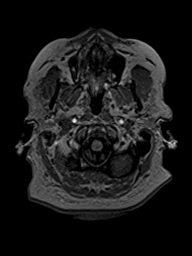
[im 36/160]
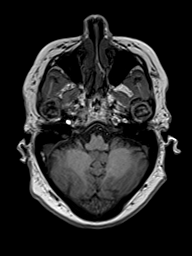
[im 54/160]
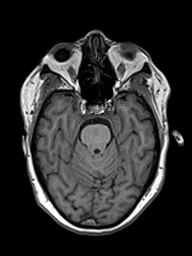
[im 71/160]
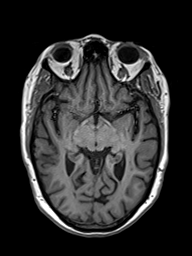
[im 89/160]
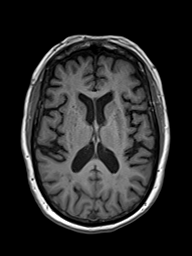
[im 107/160]
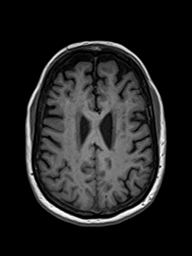
[im 124/160]
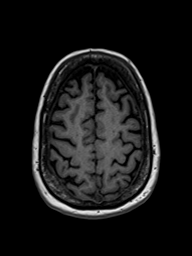
[im 142/160]
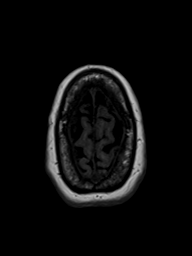
[im 160/160]
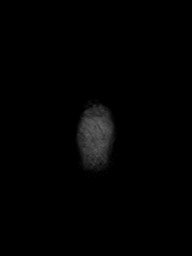

[Series 16: T2 · coronal · 5.0mm · 0.36mm/px · 2 of 33 slices shown (2 of 2)]
[im 1/33]
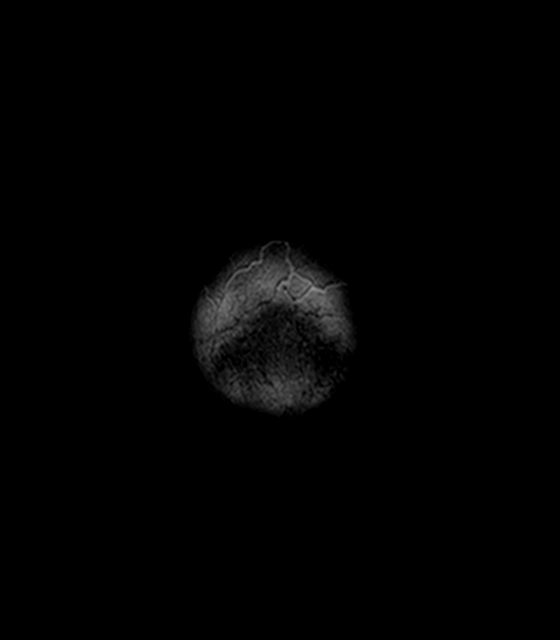
[im 33/33]
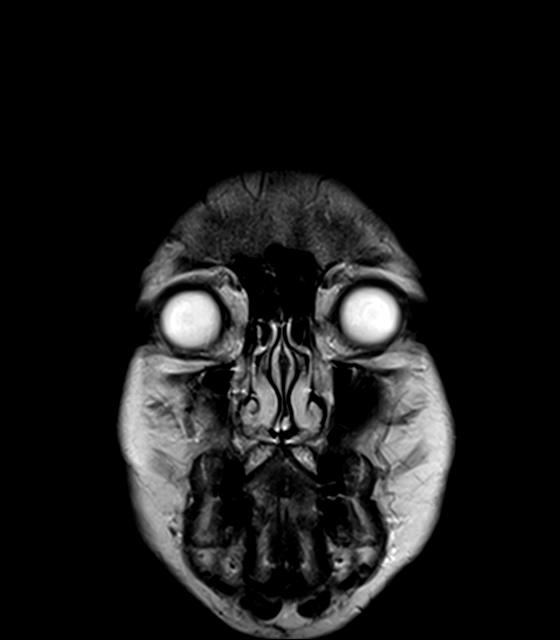

[48 of 48 positions shown; findings below may reference images not displayed]

FINDINGS: Brain: No acute infarct, hemorrhage, or mass lesion is present.
Periventricular and subcortical T2 hyperintensities bilaterally are
moderately advanced for age. The ventricles are of normal size. No
significant extraaxial fluid collection is present.

The internal auditory canals are within normal limits. The brainstem
and cerebellum are within normal limits.

Vascular: Flow is present in the major intracranial arteries.

Skull and upper cervical spine: Degenerative endplate changes efface
the ventral CSF at C4-5. Craniocervical junction otherwise within
normal limits. Midline structures unremarkable. Marrow signal is
normal.

Sinuses/Orbits: Minimal fluid is present in the right maxillary
sinus. The paranasal sinuses and mastoid air cells are otherwise
clear. Bilateral lens replacements are noted. Globes and orbits are
otherwise unremarkable.
IMPRESSION: 1. No acute intracranial abnormality.
2. Periventricular and subcortical T2 hyperintensities bilaterally
are moderately advanced for age. The finding is nonspecific but can
be seen in the setting of chronic microvascular ischemia, a
demyelinating process such as multiple sclerosis, vasculitis,
complicated migraine headaches, or as the sequelae of a prior
infectious or inflammatory process.

## 2021-03-25 ENCOUNTER — Ambulatory Visit: Payer: Medicare Other

## 2021-03-25 DIAGNOSIS — Z20822 Contact with and (suspected) exposure to covid-19: Secondary | ICD-10-CM | POA: Diagnosis not present

## 2021-03-26 ENCOUNTER — Ambulatory Visit: Payer: Medicare Other

## 2021-03-26 NOTE — Progress Notes (Signed)
 CLINIC:  Survivorship   REASON FOR VISIT:  Routine follow-up for history of breast cancer.   BRIEF ONCOLOGIC HISTORY:    (1)  status post right breast upper outer quadrant biopsy 03/24/2013 for a pT1b cN0, stage IA invasive ductal carcinoma, grade 1, strongly estrogen and progesterone receptor positive, HER-2 negative   (2) status post bilateral mastectomies 05/23/2013 showing:             (a) on the right, a pT1b pN0, stage invasive ductal carcinoma, grade 1, again HER-2 negative             (b) on the left, no evidence of malignancy   (3) Oncotype DX score of 20 predicted a rate of distant recurrence within 10 years of 13% if the patient's only systemic treatment was tamoxifen for 5 years.   (4) a positive margin required adjuvant radiation, completed January 2015 in [place]   (5) status post bilateral implant placement January 2015             (a) with impending right implant rupture February 2016 underwent bilateral DIEP reconstruction under Dr. Jeremy Williams in [place] (fax [phone])   (6)  started on anastrozole, mid September 2014, prior to definitive surgery; continued postop; bone density 07/31/2013 was normal             (a) repeat bone density 02/03/2016 showed a T score of -0.1 (normal             (b) switched to tamoxifen November 2018 because of arthralgias secondary to the anastrozole             (c) switched to exemestane as of 12/13/2017             (d) completing five years of anti-estrogens September 2019   INTERVAL HISTORY:  Tracey [name] presents to the [facility] today for routine follow-up for her history of breast cancer.  Overall, she reports feeling quite well.  She continues to travel back and forth between [place] and [place].  She enjoys this very much.  She is excited because she has recently lost 12 pounds with [employer].  She is exercising as she can and she underwent a brain MRI on 8/8 that showed a possible demylenating process.  She is  worried about this and wonders who she can see in neurology and if Dr. Dohmeier will continue to see her.    Otherwise Tracey [name] is well and up to date with her health maintenance.     REVIEW OF SYSTEMS:  Review of Systems  Constitutional:  Negative for appetite change, chills, fatigue, fever and unexpected weight change.  HENT:   Negative for hearing loss, lump/mass and trouble swallowing.   Eyes:  Negative for eye problems and icterus.  Respiratory:  Negative for chest tightness, cough and shortness of breath.   Cardiovascular:  Negative for chest pain, leg swelling and palpitations.  Gastrointestinal:  Negative for abdominal distention, abdominal pain, constipation, diarrhea, nausea and vomiting.  Endocrine: Negative for hot flashes.  Genitourinary:  Negative for difficulty urinating.   Musculoskeletal:  Negative for arthralgias.  Skin:  Negative for itching and rash.  Neurological:  Negative for dizziness, extremity weakness, headaches and numbness.  Hematological:  Negative for adenopathy. Does not bruise/bleed easily.  Psychiatric/Behavioral:  Negative for depression. The patient is not nervous/anxious.   Breast: Denies any new nodularity, masses, tenderness, nipple changes, or nipple discharge.    PAST MEDICAL/SURGICAL HISTORY:  Past Medical History:  Diagnosis Date     Allergy    Anxiety    takes Ativan daily prn   Arthritis    Breast cancer (HCC) 2014   ER+/PR+/HEr2-,    Bruises easily    Colon polyps    2 polyps by report   Diverticulosis    GERD (gastroesophageal reflux disease)    occasionally takes Nexium    Gout    takes Allopurinol daily and Colchicine daily prn;last attack 3yrs ago   Gout    History of bladder infections    many yrs ago   History of bronchitis    last time at least 8yrs ago   History of stress incontinence    Hyperlipidemia    takes Pravastatin daily   Insomnia    takes Ambien nightly prn   NSTEMI (non-ST elevated myocardial infarction)  (HCC) 08/2017   OSA (obstructive sleep apnea)    on CPAP   Osteoarthritis    Peripheral edema    takes Furosemide daily prn   PONV (postoperative nausea and vomiting)    Postmenopausal hormone therapy    Radiation 07/27/13-09/07/13   Right Breast x 31 treatments   Rhinitis    uses Flonase prn   Sinus congestion    Status post breast reconstruction 10/15/14   Bilateral implant removal and DIEP performed in [place], [place]   Tachycardia    takes Metoprolol daily   Ventricular tachycardia, non-sustained (HCC)    during sleep study 2009 with normal cardiac workup   Past Surgical History:  Procedure Laterality Date   ABDOMINAL HYSTERECTOMY     with BSO   APPENDECTOMY     AXILLARY SENTINEL NODE BIOPSY Right 05/23/2013   Procedure: AXILLARY SENTINEL NODE BIOPSY;  Surgeon: Todd J Rosenbower, MD;  Location: [facility] OR;  Service: General;  Laterality: Right;  nuc med injection 7:00   BREAST RECONSTRUCTION WITH PLACEMENT OF TISSUE EXPANDER AND FLEX HD (ACELLULAR HYDRATED DERMIS) Bilateral 05/23/2013   Procedure: BILATERAL BREAST RECONSTRUCTION WITH PLACEMENT OF TISSUE EXPANDER AND FLEX HD;  Surgeon: David Bowers, MD;  Location: [facility] OR;  Service: Plastics;  Laterality: Bilateral;   CARDIAC CATHETERIZATION  1999   CATARACT EXTRACTION, BILATERAL Bilateral    CHOLECYSTECTOMY     COSMETIC SURGERY     OTHER SURGICAL HISTORY  09/2017   had cyst removal from spine    PERCUTANEOUS CORONARY STENT INTERVENTION (PCI-S)  08/2017   done in [place]     REMOVAL OF BILATERAL TISSUE EXPANDERS WITH PLACEMENT OF BILATERAL BREAST IMPLANTS Bilateral 05/17/2014   Procedure: REMOVAL OF BILATERAL TISSUE EXPANDERS WITH PLACEMENT OF BILATERAL BREAST IMPLANTS FOR RECONSTRUCTION;  Surgeon: David Bowers, MD;  Location: [facility] OR;  Service: Plastics;  Laterality: Bilateral;   TONSILLECTOMY     TOTAL KNEE ARTHROPLASTY Right 07/26/2018   Procedure: TOTAL KNEE ARTHROPLASTY;  Surgeon: Olin, Matthew, MD;  Location: [facility];  Service:  Orthopedics;  Laterality: Right;  70 mins   TOTAL MASTECTOMY Bilateral 05/23/2013   Procedure: TOTAL MASTECTOMY;  Surgeon: Todd J Rosenbower, MD;  Location: [facility] OR;  Service: General;  Laterality: Bilateral;     ALLERGIES:  Allergies  Allergen Reactions   Pseudoeph-Hydrocodone-Gg Other (See Comments)   Ivp Dye [Iodinated Diagnostic Agents] Hives   Sulfa Antibiotics Swelling   Tape Other (See Comments)    Unknown; has paper thin skin ; ok to use paper tape      CURRENT MEDICATIONS:  Outpatient Encounter Medications as of 03/28/2021  Medication Sig   allopurinol (ZYLOPRIM) 100 MG tablet TAKE 1 TABLET BY MOUTH ONCE   DAILY   amoxicillin (AMOXIL) 500 MG tablet Take 4 tablets by mouth as directed. Take 1 hr before dental appt   apraclonidine (IOPIDINE) 0.5 % ophthalmic solution Place 1 drop into the right eye daily as needed (for droopy eye).    ASPIRIN LOW DOSE 81 MG chewable tablet Chew 81 mg by mouth daily.    celecoxib (CELEBREX) 200 MG capsule celecoxib 200 mg capsule  TAKE 1 CAPSULE BY MOUTH EVERY DAY   Cholecalciferol (VITAMIN D3) 50 MCG (2000 UT) TABS Take 2,000 Units by mouth daily.   clopidogrel (PLAVIX) 75 MG tablet Take 1 tablet (75 mg total) by mouth daily. Please call the office to schedule a one year follow up appointment   colchicine 0.6 MG tablet TAKE 1 TABLET BY MOUTH EVERY DAY OR AS NEEDED   cyanocobalamin (,VITAMIN B-12,) 1000 MCG/ML injection Inject 1,000 mcg into the muscle every 30 (thirty) days.   cyclobenzaprine (FLEXERIL) 10 MG tablet Take 1 tablet (10 mg total) by mouth 3 (three) times daily as needed for muscle spasms.   docusate sodium (COLACE) 100 MG capsule Take 1 capsule (100 mg total) by mouth 2 (two) times daily.   ezetimibe (ZETIA) 10 MG tablet TAKE 1 TABLET(10 MG) BY MOUTH DAILY   fluticasone (FLONASE) 50 MCG/ACT nasal spray Place 1 spray into both nostrils daily.   furosemide (LASIX) 40 MG tablet Take 40 mg by mouth daily as needed for fluid.     lisinopril (ZESTRIL) 10 MG tablet TAKE 1 TABLET(10 MG) BY MOUTH DAILY   metoprolol succinate (TOPROL-XL) 25 MG 24 hr tablet TAKE 1 TABLET(25 MG) BY MOUTH DAILY   Multiple Vitamin (MULTI-VITAMIN) tablet Take by mouth.   nitroGLYCERIN (NITROSTAT) 0.4 MG SL tablet PLACE 1 TABLET UNDER THE TONGUE EVERY 5 MINUTES AS NEEDED FOR CHEST PAIN WITH A MAX OF 3 DOSES   NONFORMULARY OR COMPOUNDED ITEM Vitamin E vaginal cream 200u/ml.  One ml pv three times weekly.   pantoprazole (PROTONIX) 40 MG tablet TAKE 1 TABLET(40 MG) BY MOUTH DAILY   phentermine (ADIPEX-P) 37.5 MG tablet Take 1 tablet by mouth as needed.   rosuvastatin (CRESTOR) 20 MG tablet TAKE 1 TABLET(20 MG) BY MOUTH DAILY   sertraline (ZOLOFT) 100 MG tablet Take 1 tablet by mouth daily.   testosterone cypionate (DEPOTESTOSTERONE CYPIONATE) 200 MG/ML injection INJECT 1 ML INTO THE MUSCLE EVERY 28 DAYS. PATIENT IS DUE FOR APPOINTMENT   No facility-administered encounter medications on file as of 03/28/2021.     ONCOLOGIC FAMILY HISTORY:  Family History  Problem Relation Age of Onset   Breast cancer Mother        possible inflammatory breast cancer   Heart attack Father    Heart disease Father    ALS Sister    Breast cancer Maternal Grandmother 65   Heart attack Maternal Grandfather    Heart attack Paternal Grandfather    Breast cancer Maternal Aunt        dx in her 50s   Breast cancer Other        maternal great grandmother; dx in her 40s    GENETIC COUNSELING/TESTING: See above--has tested negative  SOCIAL HISTORY:  Social History   Socioeconomic History   Marital status: Married    Spouse name: Not on file   Number of children: 2   Years of education: Not on file   Highest education level: Not on file  Occupational History   Not on file  Tobacco Use   Smoking status: Former      Packs/day: 1.00    Years: 20.00    Pack years: 20.00    Types: Cigarettes    Start date: 06/09/1992   Smokeless tobacco: Never   Tobacco  comments:    quit at age 51  Vaping Use   Vaping Use: Never used  Substance and Sexual Activity   Alcohol use: Yes    Alcohol/week: 7.0 - 14.0 standard drinks    Types: 7 - 14 Glasses of wine per week   Drug use: No    Comment: quit 24 years ago   Sexual activity: Yes    Birth control/protection: Surgical  Other Topics Concern   Not on file  Social History Narrative   Not on file   Social Determinants of Health   Financial Resource Strain: Not on file  Food Insecurity: Not on file  Transportation Needs: Not on file  Physical Activity: Not on file  Stress: Not on file  Social Connections: Not on file  Intimate Partner Violence: Not on file      PHYSICAL EXAMINATION:  Vital Signs: Vitals:   03/28/21 1138  BP: 121/63  Pulse: 76  Resp: 18  Temp: 98.6 F (37 C)  SpO2: 100%   Filed Weights   03/28/21 1138  Weight: 141 lb 1.6 oz (64 kg)   General: Well-nourished, well-appearing female in no acute distress.  Unaccompanied today.   HEENT: Head is normocephalic.  Pupils equal and reactive to light. Conjunctivae clear without exudate.  Sclerae anicteric. Oral mucosa is pink, moist.  Oropharynx is pink without lesions or erythema.  Lymph: No cervical, supraclavicular, or infraclavicular lymphadenopathy noted on palpation.  Cardiovascular: Regular rate and rhythm.Marland Kitchen Respiratory: Clear to auscultation bilaterally. Chest expansion symmetric; breathing non-labored.  Breast Exam:  Breasts are s/p mastectomy and reconstruction, no sign of local recurrence -Axilla: No axillary adenopathy bilaterally.  GI: Abdomen soft and round; non-tender, non-distended. Bowel sounds normoactive. No hepatosplenomegaly.   GU: Deferred.  Neuro: No focal deficits. Steady gait.  Psych: Mood and affect normal and appropriate for situation.  MSK: No focal spinal tenderness to palpation, full range of motion in bilateral upper extremities Extremities: No edema. Skin: Warm and dry.  LABORATORY DATA:   None for this visit   DIAGNOSTIC IMAGING:  Most recent mammogram: s/p bilateral mastectomies and reconstruction    ASSESSMENT AND PLAN:  Ms.. Tracey [name] is a pleasant 75 y.o. female with history of Stage IA right breast invasive ductal carcinoma, ER+/PR+/HER2-, diagnosed in 03/2013, treated with bilateral mastectomies, and anti estrogen therapy with aromatase inhibitors x 5 years completed in 04/2018.  She [facility] for surveillance and routine follow-up.   1. History of breast cancer:  Ms. Giovanni is currently clinically and radiographically without evidence of disease or recurrence of breast cancer.  She is doing great and we will see her back in one year for continued surveillance and monitoring.  I encouraged her to call me with any questions or concerns before her next visit at the cancer center, and I would be happy to see her sooner, if needed.    2. Bone health:  She was given education on specific food and activities to promote bone health.  3. Cancer screening:  Due to Ms. Amyx history and her age, she should receive screening for skin cancers, colon cancer.  She was encouraged to follow-up with her PCP for appropriate cancer screenings.   4. Health maintenance and wellness promotion: Ms. Mapel was encouraged to consume 5-7 servings of fruits and  vegetables per day. She was also encouraged to engage in moderate to vigorous exercise for 30 minutes per day most days of the week. She was instructed to limit her alcohol consumption and continue to abstain from tobacco use.  I congratulated her on her weight loss.  5. Abnormal MRI: I sent Jaleiah's neurologist a message about the MRI at Zakaiya's request.  I asked if she would be following the abnormal finding.  I let Jasani know that once I hear back from her I will call her with an update.      Dispo:  -Return to cancer center in one year for LTS follow up   Total encounter time: 20 minutes*  Wilber Bihari, NP  03/28/21 1:37 PM Medical Oncology and Hematology [facility] [place], [place] [address] Tel. [phone]    Fax. [phone]  *Total Encounter Time as defined by the Centers for Medicare and Medicaid Services includes, in addition to the face-to-face time of a patient visit (documented in the note above) non-face-to-face time: obtaining and reviewing outside history, ordering and reviewing medications, tests or procedures, care coordination (communications with other health care professionals or caregivers) and documentation in the medical record.   Note: PRIMARY CARE PROVIDER [name], [place]

## 2021-03-27 ENCOUNTER — Telehealth: Payer: Self-pay

## 2021-03-27 NOTE — Telephone Encounter (Signed)
Labs/Immunizations have been requested from PCP.

## 2021-03-28 ENCOUNTER — Other Ambulatory Visit: Payer: Self-pay

## 2021-03-28 ENCOUNTER — Encounter: Payer: Self-pay | Admitting: Adult Health

## 2021-03-28 ENCOUNTER — Inpatient Hospital Stay: Payer: Medicare Other | Attending: Adult Health | Admitting: Adult Health

## 2021-03-28 VITALS — BP 121/63 | HR 76 | Temp 98.6°F | Resp 18 | Ht 62.0 in | Wt 141.1 lb

## 2021-03-28 DIAGNOSIS — Z923 Personal history of irradiation: Secondary | ICD-10-CM | POA: Insufficient documentation

## 2021-03-28 DIAGNOSIS — Z853 Personal history of malignant neoplasm of breast: Secondary | ICD-10-CM | POA: Diagnosis not present

## 2021-03-28 DIAGNOSIS — Z9013 Acquired absence of bilateral breasts and nipples: Secondary | ICD-10-CM | POA: Insufficient documentation

## 2021-03-28 DIAGNOSIS — Z885 Allergy status to narcotic agent status: Secondary | ICD-10-CM | POA: Insufficient documentation

## 2021-03-28 DIAGNOSIS — I252 Old myocardial infarction: Secondary | ICD-10-CM | POA: Insufficient documentation

## 2021-03-28 DIAGNOSIS — Z882 Allergy status to sulfonamides status: Secondary | ICD-10-CM | POA: Diagnosis not present

## 2021-03-28 DIAGNOSIS — Z90722 Acquired absence of ovaries, bilateral: Secondary | ICD-10-CM | POA: Insufficient documentation

## 2021-03-28 DIAGNOSIS — Z8719 Personal history of other diseases of the digestive system: Secondary | ICD-10-CM | POA: Diagnosis not present

## 2021-03-28 DIAGNOSIS — Z87891 Personal history of nicotine dependence: Secondary | ICD-10-CM | POA: Diagnosis not present

## 2021-03-28 DIAGNOSIS — Z803 Family history of malignant neoplasm of breast: Secondary | ICD-10-CM | POA: Insufficient documentation

## 2021-03-28 DIAGNOSIS — Z8249 Family history of ischemic heart disease and other diseases of the circulatory system: Secondary | ICD-10-CM | POA: Diagnosis not present

## 2021-03-28 DIAGNOSIS — Z9049 Acquired absence of other specified parts of digestive tract: Secondary | ICD-10-CM | POA: Insufficient documentation

## 2021-03-28 DIAGNOSIS — C50411 Malignant neoplasm of upper-outer quadrant of right female breast: Secondary | ICD-10-CM

## 2021-03-28 DIAGNOSIS — Z888 Allergy status to other drugs, medicaments and biological substances status: Secondary | ICD-10-CM | POA: Diagnosis not present

## 2021-03-28 DIAGNOSIS — Z79899 Other long term (current) drug therapy: Secondary | ICD-10-CM | POA: Diagnosis not present

## 2021-03-31 ENCOUNTER — Telehealth: Payer: Self-pay | Admitting: Adult Health

## 2021-03-31 ENCOUNTER — Ambulatory Visit: Payer: Medicare Other | Admitting: Physical Therapy

## 2021-03-31 ENCOUNTER — Encounter: Payer: Self-pay | Admitting: Neurology

## 2021-03-31 ENCOUNTER — Other Ambulatory Visit: Payer: Self-pay

## 2021-03-31 DIAGNOSIS — R42 Dizziness and giddiness: Secondary | ICD-10-CM

## 2021-03-31 DIAGNOSIS — H8111 Benign paroxysmal vertigo, right ear: Secondary | ICD-10-CM

## 2021-03-31 DIAGNOSIS — R2681 Unsteadiness on feet: Secondary | ICD-10-CM | POA: Diagnosis not present

## 2021-03-31 DIAGNOSIS — R2689 Other abnormalities of gait and mobility: Secondary | ICD-10-CM

## 2021-03-31 NOTE — Therapy (Signed)
[facility] [address] [place], [place], [address] Phone: [phone]   Fax:  [phone]  Physical Therapy Treatment  Patient Details  Name: Tracey [name] MRN: [id number] Date of Birth: 01-09-1946 Referring Provider (PT): Dr. Asencion Partridge Dohmeier   Encounter Date: 03/31/2021   PT End of Session - 03/31/21 1544     Visit Number 9    Number of Visits 14    Date for PT Re-Evaluation 04/09/21    Authorization Type Medicare A/B; [employer]    Progress Note Due on Visit 10    PT Start Time 1537    PT Stop Time 1620    PT Time Calculation (min) 43 min    Activity Tolerance Patient tolerated treatment well    Behavior During Therapy [facility] for tasks assessed/performed             Past Medical History:  Diagnosis Date   Allergy    Anxiety    takes Ativan daily prn   Arthritis    Breast cancer ([place]) 2014   ER+/PR+/HEr2-,    Bruises easily    Colon polyps    2 polyps by report   Diverticulosis    GERD (gastroesophageal reflux disease)    occasionally takes Nexium    Gout    takes Allopurinol daily and Colchicine daily prn;last attack 54yr ago   Gout    History of bladder infections    many yrs ago   History of bronchitis    last time at least 813yrago   History of stress incontinence    Hyperlipidemia    takes Pravastatin daily   Insomnia    takes Ambien nightly prn   NSTEMI (non-ST elevated myocardial infarction) ([facility]/2019   OSA (obstructive sleep apnea)    on CPAP   Osteoarthritis    Peripheral edema    takes Furosemide daily prn   PONV (postoperative nausea and vomiting)    Postmenopausal hormone therapy    Radiation 07/27/13-09/07/13   Right Breast x 31 treatments   Rhinitis    uses Flonase prn   Sinus congestion    Status post breast reconstruction 10/15/14   Bilateral implant removal and DIEP performed in [place], [place]   Tachycardia    takes Metoprolol daily   Ventricular tachycardia,  non-sustained ([facility]   during sleep study 2009 with normal cardiac workup    Past Surgical History:  Procedure Laterality Date   ABDOMINAL HYSTERECTOMY     with BSO   APPENDECTOMY     AXILLARY SENTINEL NODE BIOPSY Right 05/23/2013   Procedure: AXILLARY SENTINEL NODE BIOPSY;  Surgeon: ToOdis HollingsheadMD;  [facility] Service: General;  Laterality: Right;  nuc med injection 7:00   BREAST RECONSTRUCTION WITH PLACEMENT OF TISSUE EXPANDER AND FLEX HD (ACELLULAR HYDRATED DERMIS) Bilateral 05/23/2013   Procedure: BILATERAL BREAST RECONSTRUCTION WITH PLACEMENT OF TISSUE EXPANDER AND FLEX HD;  Surgeon: DaCrissie ReeseMD;  [facility] Service: Plastics;  Laterality: Bilateral;   CARDIAC CATHETERIZATION  1999   CATARACT EXTRACTION, BILATERAL Bilateral    CHOLECYSTECTOMY     COSMETIC SURGERY     OTHER SURGICAL HISTORY  09/2017   had cyst removal from spine    PERCUTANEOUS CORONARY STENT INTERVENTION (PCI-S)  08/2017   [place] BILATERAL BRE[place] IMPLANTS Bilateral 05/17/2014   Procedure: REMOVAL OF BILATERAL TISSUE EXPANDERS WITH PLACEMENT OF  BILATERAL BREAST IMPLANTS FOR RECONSTRUCTION;  Surgeon: [name], MD;  Location: [facility];  Service: Plastics;  Laterality: Bilateral;   TONSILLECTOMY     TOTAL KNEE ARTHROPLASTY Right 07/26/2018   Procedure: TOTAL KNEE ARTHROPLASTY;  Surgeon: Paralee Cancel, MD;  Location: [facility];  Service: Orthopedics;  Laterality: Right;  70 mins   TOTAL MASTECTOMY Bilateral 05/23/2013   Procedure: TOTAL MASTECTOMY;  Surgeon: [name], MD;  Location: [facility];  Service: General;  Laterality: Bilateral;    There were no vitals filed for this visit.   Subjective Assessment - 03/31/21 1545     Subjective Went on her float trip, used her cane some.  Dizziness comes and goes; looking back and forth at her husband and daughter made her feel dizzy and when she lies straight back she feels dizzy.     Patient is accompained by: Family member    Pertinent History Breast Cancer 2014, MI, Rt TKA    Limitations Walking    How long can you sit comfortably? unlimited    How long can you stand comfortably? unlimited    How long can you walk comfortably? unlimited    Patient Stated Goals To become more stable with her balance    Currently in Pain? No/denies                     Vestibular Assessment - 03/31/21 1546       Positional Testing   Dix-Hallpike Dix-Hallpike Right;Dix-Hallpike Left    Horizontal Canal Testing Horizontal Canal Right;Horizontal Canal Left      Dix-Hallpike Right   Dix-Hallpike Right Duration 0    Dix-Hallpike Right Symptoms No nystagmus      Dix-Hallpike Left   Dix-Hallpike Left Duration 0    Dix-Hallpike Left Symptoms No nystagmus      Horizontal Canal Right   Horizontal Canal Right Duration 0    Horizontal Canal Right Symptoms Normal      Horizontal Canal Left   Horizontal Canal Left Duration 0    Horizontal Canal Left Symptoms Normal                       Vestibular Treatment/Exercise - 03/31/21 1557       Vestibular Treatment/Exercise   Vestibular Treatment Provided Gaze;Habituation    Habituation Exercises Standing Vertical Head Turns;Standing Diagonal Head Turns    Gaze Exercises X1 Viewing Horizontal;X1 Viewing Vertical      Standing Vertical Head Turns   Number of Reps  5    Symptom Description  with functional reaching down to low surface x 5 and then down to floor x 5, combined with trunk rotation and diagonal head turns below      Standing Diagonal Head Turns   Number of Reps  5    Symptiom Description  combined with functional reaching, reaching across midline for object.  Performed to L and R and then combined with bending down to the ground      X1 Viewing Horizontal   Foot Position standing feet apart and then feet together, solid surface    Comments 60 seconds - reported double letters at end of time.   Dizziness with feet together.  Required cues to slow head movement down or to hold body steady when performing with feet together      X1 Viewing Vertical   Foot Position standing feet apart and then feet together, solid surface    Comments 60 seconds - reported  double letters at end of time.  Dizziness with feet together.  Required cues to slow head movement down or to hold body steady when performing with feet together                Balance Exercises - 03/31/21 1631       Balance Exercises: Standing   Standing Eyes Closed Narrow base of support (BOS);Head turns;Solid surface;5 reps;Limitations    Standing Eyes Closed Limitations one finger touch to chair, 5 reps head turns/nods               PT Education - 03/31/21 1638     Education Details updated/progressed HEP    Person(s) Educated Patient    Methods Explanation;Demonstration;Handout    Comprehension Verbalized understanding;Returned demonstration              PT Short Term Goals - 03/10/21 2035       PT SHORT TERM GOAL #1   Title = LTG               PT Long Term Goals - 03/17/21 1639       PT LONG TERM GOAL #1   Title Patient to score >54 on BERG    Baseline 43 > 52    Time 4    Period Weeks    Status Revised    Target Date 04/09/21      PT LONG TERM GOAL #2   Title Patient to demo I in final HEP    Baseline ongoing    Time 4    Period Weeks    Status Revised    Target Date 04/09/21      PT LONG TERM GOAL #3   Title Patient to score 22 on DGI    Baseline Initial DGI 19    Time 4    Period Weeks    Status Revised    Target Date 04/09/21      PT LONG TERM GOAL #4   Title Pt will demonstrate improved vestibular function and use of VOR as indicated by 2 line difference on DVA    Baseline 4 line difference (8,4)    Time 4    Period Weeks    Status Revised    Target Date 04/09/21      PT LONG TERM GOAL #5   Title Pt will demonstrate ability to maintain balance when vision  removed as indicated by increase in time to 15 seconds on MCTSIB condition 2 and 4    Baseline 4-5 seconds maximum    Time 4    Period Weeks    Status Revised    Target Date 04/09/21      PT LONG TERM GOAL #6   Title Pt will demonstrate negative positional testing for R and L side and report no dizziness/vertigo with rolling or supine <> sit    Baseline R BPPV    Time 4    Period Weeks    Status New    Target Date 04/09/21                   Plan - 03/31/21 1636     Clinical Impression Statement Patient demonstrates ongoing resolution of BPPV.  Pt demonstrates improvement with VOR, sensory integration and motion sensitivity.  Due to progress, PT upgraded HEP.    Personal Factors and Comorbidities Comorbidity 3+    Comorbidities MI, Rt TKA, cancer    Examination-Activity Limitations Locomotion Level;Stand;Bed Mobility    Examination-Participation  Restrictions Cleaning;Shop;Community Activity;Yard Work;Interpersonal Relationship    Rehab Potential Good    PT Frequency 2x / week    PT Duration 4 weeks    PT Treatment/Interventions ADLs/Self Care Home Management;Therapeutic activities;Patient/family education;Balance training;Gait training;Neuromuscular re-education;DME Instruction;Therapeutic exercise;Vestibular;Stair training;Functional mobility training;Canalith Repostioning    PT Next Visit Plan 10th visit Progress Note.  How was her appointment with neurology?  Assess and treat R BPPV if symptoms return.  Habituation to head movements during functional activities.  Balance reactions on rockerboard.  Medicine ball chops on foam for - Golf.  Bending down with ball between legs with turns/compliant surface.    Consulted and Agree with Plan of Care Patient             Patient will benefit from skilled therapeutic intervention in order to improve the following deficits and impairments:  Decreased mobility, Decreased balance, Dizziness, Difficulty walking, Impaired  vision/preception, Impaired sensation, Decreased activity tolerance  Visit Diagnosis: Unsteadiness on feet  Other abnormalities of gait and mobility  Dizziness and giddiness  BPPV (benign paroxysmal positional vertigo), right     Problem List Patient Active Problem List   Diagnosis Date Noted   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   CPAP (continuous positive airway pressure) dependence 01/20/2021   Unsteady gait when walking 01/20/2021   Complaint related to dreams 68/07/7516   Metabolic acidosis with increased anion gap and accumulation of organic acids 08/17/2018   Nausea, vomiting, and diarrhea 08/17/2018   AKI (acute kidney injury) ([place]) 08/17/2018   Overweight (BMI 25.0-29.9) 07/27/2018   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/27/2018   S/P right TKA 07/26/2018   S/P knee replacement 07/26/2018   UARS (upper airway resistance syndrome) 05/30/2018   Hyperlipidemia 05/02/2018   Leg injury, right, initial encounter 01/06/2017   Contusion of right lower leg 01/06/2017   Hypoxemia 12/23/2015   Statin myopathy 05/20/2015   MCI (mild cognitive impairment) 05/20/2015   Edema 05/17/2015   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 [place], [place], [address] Phone: [phone]   Fax:  [phone]  Name: Tracey [name] MRN: [id number] Date of Birth: Oct 18, 1945

## 2021-03-31 NOTE — Patient Instructions (Addendum)
Gaze Stabilization: Standing Feet Together    Feet together, keeping eyes on target on wall __3__ feet away, tilt head down 15-30 and move head side to side for __60__ seconds. Repeat while moving head up and down for __60__ seconds. Do __2__ sessions per day.    2. Bending to Pick up   Stand with feet apart, an object on the floor in front of you and a low ottoman/chair to the Right of you.   Bend down and pick up the object with your LEFT hand, rotate and put it on the chair/ottoman.  Face forwards again and return the object to the floor. Repeat this sequence 3 times.  Turn around so the chair/ottoman is on your LEFT side and repeat the sequence using your RIGHT hand, 3 times.    3.   Feet Together, NO Head Motion - Eyes Closed      With eyes closed and feet together.  Place one finger on a chair in front of you.  Close your eyes and focus on keeping your balance while turning your head to the left and to the right, 10 times. Repeat moving your head up and down slowly, 10 times. Do 2 sessions per day.

## 2021-03-31 NOTE — Telephone Encounter (Signed)
Scheduled appointment per 08/12 los. Left message.

## 2021-04-02 ENCOUNTER — Encounter: Payer: Self-pay | Admitting: Neurology

## 2021-04-02 ENCOUNTER — Other Ambulatory Visit: Payer: Self-pay

## 2021-04-02 ENCOUNTER — Ambulatory Visit: Payer: Medicare Other

## 2021-04-02 ENCOUNTER — Ambulatory Visit (INDEPENDENT_AMBULATORY_CARE_PROVIDER_SITE_OTHER): Payer: Medicare Other | Admitting: Neurology

## 2021-04-02 VITALS — BP 120/72 | HR 62 | Ht 61.5 in | Wt 140.0 lb

## 2021-04-02 DIAGNOSIS — R42 Dizziness and giddiness: Secondary | ICD-10-CM

## 2021-04-02 DIAGNOSIS — R2681 Unsteadiness on feet: Secondary | ICD-10-CM

## 2021-04-02 DIAGNOSIS — I2510 Atherosclerotic heart disease of native coronary artery without angina pectoris: Secondary | ICD-10-CM | POA: Diagnosis not present

## 2021-04-02 DIAGNOSIS — R2689 Other abnormalities of gait and mobility: Secondary | ICD-10-CM | POA: Diagnosis not present

## 2021-04-02 DIAGNOSIS — H8111 Benign paroxysmal vertigo, right ear: Secondary | ICD-10-CM | POA: Diagnosis not present

## 2021-04-02 DIAGNOSIS — G4731 Primary central sleep apnea: Secondary | ICD-10-CM | POA: Diagnosis not present

## 2021-04-02 NOTE — Patient Instructions (Signed)
Dizziness Dizziness is a common problem. It is a feeling of unsteadiness or light-headedness. You may feel like you are about to faint. Dizziness can lead to injury if you stumble or fall. Anyone can become dizzy, but dizziness is more common in older adults. This condition can be caused by a number of things, including medicines, dehydration, or illness. Follow these instructions at home: Eating and drinking  Drink enough fluid to keep your urine pale yellow. This helps to keep you from becoming dehydrated. Try to drink more clear fluids, such as water. Do not drink alcohol. Limit your caffeine intake if told to do so by your health care provider. Check ingredients and nutrition facts to see if a food or beverage contains caffeine. Limit your salt (sodium) intake if told to do so by your health care provider. Check ingredients and nutrition facts to see if a food or beverage contains sodium. Activity  Avoid making quick movements. Rise slowly from chairs and steady yourself until you feel okay. In the morning, first sit up on the side of the bed. When you feel okay, stand slowly while you hold onto something until you know that your balance is good. If you need to stand in one place for a long time, move your legs often. Tighten and relax the muscles in your legs while you are standing. Do not drive or use machinery if you feel dizzy. Avoid bending down if you feel dizzy. Place items in your home so that they are easy for you to reach without leaning over. Lifestyle Do not use any products that contain nicotine or tobacco. These products include cigarettes, chewing tobacco, and vaping devices, such as e-cigarettes. If you need help quitting, ask your health care provider. Try to reduce your stress level by using methods such as yoga or meditation. Talk with your health care provider if you need help to manage your stress. General instructions Watch your dizziness for any changes. Take  over-the-counter and prescription medicines only as told by your health care provider. Talk with your health care provider if you think that your dizziness is caused by a medicine that you are taking. Tell a friend or a family member that you are feeling dizzy. If he or she notices any changes in your behavior, have this person call your health care provider. Keep all follow-up visits. This is important. Contact a health care provider if: Your dizziness does not go away or you have new symptoms. Your dizziness or light-headedness gets worse. You feel nauseous. You have reduced hearing. You have a fever. You have neck pain or a stiff neck. Your dizziness leads to an injury or a fall. Get help right away if: You vomit or have diarrhea and are unable to eat or drink anything. You have problems talking, walking, swallowing, or using your arms, hands, or legs. You feel generally weak. You have any bleeding. You are not thinking clearly or you have trouble forming sentences. It may take a friend or family member to notice this. You have chest pain, abdominal pain, shortness of breath, or sweating. Your vision changes or you develop a severe headache. These symptoms may represent a serious problem that is an emergency. Do not wait to see if the symptoms will go away. Get medical help right away. Call your local emergency services (911 in the [place] Do not drive yourself to the hospital. Summary Dizziness is a feeling of unsteadiness or light-headedness. This condition can be caused by a number of   things, including medicines, dehydration, or illness. Anyone can become dizzy, but dizziness is more common in older adults. Drink enough fluid to keep your urine pale yellow. Do not drink alcohol. Avoid making quick movements if you feel dizzy. Monitor your dizziness for any changes. This information is not intended to replace advice given to you by your health care provider. Make sure you discuss any  questions you have with your health care provider. Document Revised: 07/08/2020 Document Reviewed: 07/08/2020 Elsevier Patient Education  2022 [employer]  

## 2021-04-02 NOTE — Progress Notes (Signed)
SLEEP MEDICINE CLINIC   Provider:  Larey [name], M D  Referring Provider: [name], MD Primary Care Physician:  Tracey Hatchet, MD  Chief Complaint  Patient presents with   Follow-up    RM 10, alone. Followup for cpap. Download from care orchestrator only shows  data from [facility]. Pt forgot machine today.  She had to use oxygen while in the [employer], and  exercise. Changed from autopap to cpap pressure setting 13 at last visit. She states she has been using machine nightly.    Interval history : Central apnea patient. Can't stay in high altitude without  02 for 24/ 7 now.   Brain MRI- Dr Tracey [name], dizziness, mild orthostatic dizziness. Vertigo is much improved, Eppley maneuvers.   Balance remains poor.  Endurance is poorer, and she contracted Covid July 4th. She feels very myalgic and fatigued.    IMPRESSION: 03-24-2021:  1. No acute intracranial abnormality. 2. Periventricular and subcortical T2 hyperintensities bilaterally are moderately advanced for age. The finding is nonspecific but can be seen in the setting of chronic microvascular ischemia, a demyelinating process such as multiple sclerosis, vasculitis, complicated migraine headaches, or as the sequelae of a prior infectious or inflammatory process.      Rv , post seep study from 01-09-2021,POLYSOMNOGRAPHY IMPRESSION :    1.        SEVERE and Complex Sleep Apnea (CSA) was treated with CPAP in this CPAP dependent patient, [place]  2.        Fragmented sleep, many spontaneous arousals from sleep 3.        Many PVCs in single channel EKG.  4.        NO REM sleep was recorded.  5.        No additional  oxygen was needed for sleep at this altitude.    RECOMMENDATIONS: Continue CPAP use with autotitration device at a range from 5 cm water  through 10 cm water , 1 cm EPR, under heated humidification and nasal N 20 mask in small size.           Interval history  12-09-2020, needing a new machine , [place]. She has bruxism, and is damaging her teeth, effexor was exchanged to zoloft to prevent some of this. She needs a mouth guard now in daytime- stress symptom? More balance problems- not affecting her golf game. Mostly unsteady on stairs,  Feels she fall s when looking down. Tracey [name] is a 75 y.o. female , seen here in a RV presents today for follow up..  Discussing she is eligible for a new machine 12/2020.  Since the patient has a history of complex sleep apnea and her current AHI is 5.4 without major air leakage and was a device pressure of 90% of the time of 13 cmH2O I would very much like for her to continue using this machine in spite of being on recall.  I ordering a home sleep test to confirm that her current settings are fine with a minimum pressure of 5 maximum pressure of 13 cmH2O to centimeter EPR heated humidity starting pressure is 4 cmH2O ramp time is only 5 minutes.  I reviewed the medication list, the Epworth Sleepiness Scale was endorsed at 5 points while on treatment.  She is not sleeping at night without it and when she spends time at her [facility] home she is usually on oxygen at night  but for the first time this winter she was asked to use it when she exercises or even walks in daytime she became so severely short of breath.     06-03-2020:  Tracey [name] is a 75 y.o. female , seen here in a RV  rm 11 presents today for follow up. states overall things are well. she asked about a new machine. in discussing she is eligible for a new machine 12/2020. however currently her machine is a respironics and may be a part of a recall as she does use a ozone cleaning device. RN informed her about registrering her device. DME [facility] [employer].  I have the pleasure of seeing Tracey [name] today, a longstanding and established patient who has used positive airway pressure therapy for the treatment of complex sleep apnea.   Her sleep apnea complexity is related to underlying causes which we have listed in her past medical history.  She she would be due for new machine as of May 2022 her current C-Flex CPAP is set with a pressure window, does not produce a lot of air leaks, and she is using it 63.3% of the time compliantly.  Actually she has used the machine 26 out of 30 days.  Some nights with use at time is shortened.  She sometimes uses the connection to the machine at night.  Her average AHI is 6.5 which has been a little higher than last year.  She is using a humidifier, she has a ramp time of 5 minutes and starts at 4 cmH2O with a maximum pressure option of 12 cm of water.  Her 95th percentile pressure used is at 12 cmH2O so she is maxing out on the upper limits.  I would rather until May increase her current pressure by 1 cm and will order a repeat sleep study which can be a home sleep test for her on or about April so that we are ready with a new baseline.   I like to have her tested before she needs the new machine.     Originally as a referral from Dr. Ardeth [name] -  She fell out of bed, had to get stitches in the ED- she has more and more dream enactment, often nightmares, and not leaving the bed. She had vivd dreams since childhood, and her grandson has night terrors. She also hears music when there is none, but only when a mechanical noise is in the background.  She never had tinnitus, but a sound as if a radio produces  static sounds was perceived.  On December10th 2019 she had a knee surgery, and the pain medications was giving her horrible delusions- she was terrified. Complex visual hallucinations - these were visions of people, snakes, no voices. People that looked into the window. Delirium.  She called her MD and the police, and her husband was helpfess.   She is OK with her apnea treatment. This year has been quiet , no travels due to Covid 19, and she enjoyed being home - sad only because she missed a  family re-union.  Her son died in 04-Jun-2023- of substance abuse.   Tracey [name] provided me with a high compliance record 93.3% for the last 30 days prior to 29 May 2019.  Average use a time for all days 7 hours 55 minutes, mean pressure of CPAP 9.1 average device pressure 11.4 average time in large leak 17 minutes average AHI 6.4.  As it is a 6.4 is related to the leak  time however we do not need to change settings.   First visit upon referral with Dr Tracey [name]. Chief complaint according to patient : " re evaluate my apnea and care." The patient reports that she moved to the [place] area about 3 years ago and still gets supplies for her established CPAP per Mail. She was diagnosed with obstructive sleep apnea in [place], in [place]. The [facility] posted the sleep center. The patient was diagnosed on 05/02/2008 the study revealed an AHI of 23 was mild decreases in oxygen level as well as continuous snoring. Then CPAP was used for the second night of the split night polysomnography and her oxygen level improved to normal her snoring resolved and the breathing was totally regulated according to Dr. Shelton Silvas note. Concerning was that there was a ventricular tachycardia as of 27 beats. The patient was evaluated by cardiology afterwards the finding could never be reciprocated. She continued to use CPAP for the last 7 years compliantly but she is a little tired of using a nasal mask that has become uncomfortable and his pressure marks on her face. She is also not sure to what degree apnea still present at this time. The patient stays about 7 months of the year here in [place], and 5 months in [place]. Usually they travel from Thanksgiving to Easter. Her husband witnesses snoring when she naps, but not while on CPAP. Her insomnia improved drastically after CPAP.   Sleep habits are as follows: The patient goes to bed between 9.30 and 10 PM, usually falls asleep promptly. She  prefers the left side to sleep , but wakes sometimes up on her back.  She learned to sleep on her back after breast cancer surgery ( diagnosed Aug 8th 2014 ). The bedroom is described as core, quiet and dark. The patient shares a bedroom with her husband. They're also 2 dogs in the bedroom on the bed. She rises usually once to go to the bathroom sometimes twice. She can fall asleep again fairly promptly. She is not a restless sleeper not kicking or moving or not excessively at night. She rises in the morning at 5 sometimes even 8. She usually wakes spontaneously not based on an alarm setting. She averages 7-8 hours of nocturnal sleep.  She reports vivid dreams, "repetitive and in full color".  Some mornings she feels refreshed and restored but not all the time. She is still recovering from her breast reconstruction surgery. She endorsed some snoring, joint pain, aching muscles and even some dizziness. She also has right shoulder bursitis which limits her ability to sleep on the right. She naps 3 out of 7 days, once  daily, a nap will last  2 hours unless she sets an alarm.   Tracey [name] this past medical history includes gout, hyperlipidemia, hypertension, palpitations probably due to nonsustained ventricular tachycardia. In addition breast cancer of which her mother maternal aunt maternal grandmother and maternal great-grandmother were also affected. She is a history of allergic rhinitis her sister died in 2011-10-14 of ALS. Sleep medical history and family sleep history: Her father was diagnosed with OSA never used CPAP ,  had an MI at age 40. Social history: non smoker, quit at age 45. ETOH , 1-2 at night. Caffeine use : none. Decaffeinated tea and coffee. Average and regular golf player. She likes winter sports, especially skiing but uses the left dangerous slope these days.  Interval history from 05-20-15. Hearing has been willing to undergo a new sleep study  and attended nocturnal polysomnography on 04-09-15.  She was diagnosed with a very mild AHI of 5.2 , supine sleep  AHI became 12.4  and it was further evident that the patient did not get into REM sleep. For this reason I feel it is much more important that the patient stays on CPAP given that her apnea was mild and she was fitted with a new mask. The new mass does not cause pressure marks around her nose and does not entangle her hair. She is using a dream wear interface, which [facility] had to explicitly order for her.  Interval history from 12/23/2015. Tracey [name] has happily returned from [place], I have the pleasure today to see her CPAP download which confirms and 96.7% compliance average user time 6 hours and 39 minutes of CPAP.The device is a C flex set at 8 cm water pressure with a ramp time of 30 minutes. The REM time is a little too long and we will reduce it to 10 minutes. Her husband has made her aware that she still snores when she doesn't use a CPAP. She has become more compliant she endorsed only 2 points today on the geriatric depression store, 7 points on the Epworth sleepiness score and the fatigue severity at 29.  Interval history from 02/26/2016. Tracey [name] is here today relaxed after a week vacation. She reports sleeping so much better with the new CPAP machine. Her sleep is deeper but she does not recall having nightmares she has fallen out of bed 1 night it was actually the third night she used the new CPAP. She feels her sleep is less fragmented, more sound, and more refreshing and restorative. Her download revealed 100% compliance over the last 30 days was 97% compliance 4 hours of use, average user time is 7 hours and 41 minutes. This is excellent compliance with CPAP is set at 8 cm water we did  use a 10 minute ramp function. Her snoring has of course been alleviated using CPAP, which pleases her husband. She has a residual AHI of 8.1 but given her decrease and sleepiness/ fatigue I would not want to change the settings.  Interval  history from 12/21/2016, Tracey [name] has been to winter in [place] and had a additional medication in [place]. She is now doing well but during her winter stay in [place] she developed shortness of breath and apparently more severe hypoxemia and apnea. She saw a pulmonologist at the location would tested her oxygen levels while walking and also diagnosed her with type a flu. She had been coughing for over 6 weeks when the flu finally converted to bronchitis and then resolved. She remained air hungry and short of breath for much of her winter time. I'm able to state today that she feels back to her baseline. But we need to discuss how we could prevent altitude related apneas which can often be central in nature and if she should use an AutoPap with an automatic pressure window. The patient is a very highly compliant CPAP user her compliance rate was 93.3% with an average user time of 7 hours and 6 minutes. She has some air leaks but not significant once CPAP is set at 8 cm water pressure with a residual AHI of 17.4. She has a short ramp time. Most of the AHI is related to shallow breathing. Her average central apnea index is 1.1 and obstructive apnea index is 3.3. Our goal is is to have an AHI of below 5. I will  ask [employer] to change her to an auto titration from 5-10 cm water. She continues to use a dream wear interface. She would like the non-prong version of the dream wear interface. We will also meet again in late September or October to discuss if she needs additional oxygen at night during her stays at high altitudes.  05-24-2017, Tracey [name] is planning again to spend the winter in [place]. She asked me to make sure that she will have oxygen available for nightly use in the upper inner air. Last year she stated she was miserable on CPAP alone. She is a very compliant CPAP use of his 100% compliance and an average user time of 9 hours and 2 minutes, uses the machine as an auto CPAP between 5  and 15 cm water, average pressure is 9.9 cm water and the average residual AHI is 5.1. The machine allows between 5 and 10 cm water pressure only, given that the patient does not have central apneas I will increase the upper limits to 12 cm water. I would also investigate is able care can help [place] with oxygen in [place] ( she knows an oxygen supplier in [place]- "[employer]" ) .   05-30-2018, The patient suffered a heart attack in January of this year, and had a rough year- needs bilateral knee replacements. Her step- son died 69 days ago at age 74 years , with alcoholism and bipolar disease, died of multi organ -liver failure. She brought her CPAP machine - and would like to change her interface .she wants to return to a pillairo or nasal pillow. Her machine was issued in  August 2016- after a AHI of only 5.2- but loud snoring and RERAS. UARS. Residual AHI was 7/h at 90% pressure of 10.2 cm water.  Compliance was 87% for time, and 26/ 30 days.    Review of Systems: Out of a complete 14 system review, the patient complains of only the following symptoms, and all other reviewed systems are negative.Snoring. Grieving - lost mother and sister to breast cancer in a short time and was diagnosed with breast cancer herself 3.5 years ago.  On antihormone therapy. She takes statins.  Epworth score 7/24  , Fatigue severity score again at  41/ 63, geriatric depression score 2 ,     her machine is 1.76  years old .  AHI increased . Nasal pillow- dream wear causes hair trouble- will switch back to a nasal pillow.  Settings are AUTO on a dream station, settings are at 5-12 with 2 cm water EPR. I increased to 13 cm.   How likely are you to doze in the following situations: 0 = not likely, 1 = slight chance, 2 = moderate chance, 3 = high chance  Sitting and Reading? Watching Television? Sitting inactive in a public place (theater or meeting)? Lying down in the afternoon when circumstances permit? Sitting  and talking to someone? Sitting quietly after lunch without alcohol? In a car, while stopped for a few minutes in traffic? As a passenger in a car for an hour without a break?  Total = 5 on auto PAP.   Bruxism  Shingles last winter, left neck and shoulder.     Social History   Socioeconomic History   Marital status: Married    Spouse name: Not on file   Number of children: 2   Years of education: Not on file   Highest education level: Not on file  Occupational History   Not  on file  Tobacco Use   Smoking status: Former    Packs/day: 1.00    Years: 20.00    Pack years: 20.00    Types: Cigarettes    Start date: 06/09/1992   Smokeless tobacco: Never   Tobacco comments:    quit at age 73  Vaping Use   Vaping Use: Never used  Substance and Sexual Activity   Alcohol use: Yes    Alcohol/week: 7.0 - 14.0 standard drinks    Types: 7 - 14 Glasses of wine per week   Drug use: No    Comment: quit 24 years ago   Sexual activity: Yes    Birth control/protection: Surgical  Other Topics Concern   Not on file  Social History Narrative   Not on file   Social Determinants of Health   Financial Resource Strain: Not on file  Food Insecurity: Not on file  Transportation Needs: Not on file  Physical Activity: Not on file  Stress: Not on file  Social Connections: Not on file  Intimate Partner Violence: Not on file    Family History  Problem Relation Age of Onset   Breast cancer Mother        possible inflammatory breast cancer   Heart attack Father    Heart disease Father    ALS Sister    Breast cancer Maternal Grandmother 104   Heart attack Maternal Grandfather    Heart attack Paternal Grandfather    Breast cancer Maternal Aunt        dx in her 48s   Breast cancer Other        maternal great grandmother; dx in her 70s    Past Medical History:  Diagnosis Date   Allergy    Anxiety    takes Ativan daily prn   Arthritis    Breast cancer ([facility]) 2014    ER+/PR+/HEr2-,    Bruises easily    Colon polyps    2 polyps by report   Diverticulosis    GERD (gastroesophageal reflux disease)    occasionally takes Nexium    Gout    takes Allopurinol daily and Colchicine daily prn;last attack 44yr ago   Gout    History of bladder infections    many yrs ago   History of bronchitis    last time at least 811yrago   History of stress incontinence    Hyperlipidemia    takes Pravastatin daily   Insomnia    takes Ambien nightly prn   NSTEMI (non-ST elevated myocardial infarction) ([facility]/2019   OSA (obstructive sleep apnea)    on CPAP   Osteoarthritis    Peripheral edema    takes Furosemide daily prn   PONV (postoperative nausea and vomiting)    Postmenopausal hormone therapy    Radiation 07/27/13-09/07/13   Right Breast x 31 treatments   Rhinitis    uses Flonase prn   Sinus congestion    Status post breast reconstruction 10/15/14   Bilateral implant removal and DIEP performed in [place], [place]   Tachycardia    takes Metoprolol daily   Ventricular tachycardia, non-sustained ([facility]   during sleep study 2009 with normal cardiac workup    Past Surgical History:  Procedure Laterality Date   ABDOMINAL HYSTERECTOMY     with BSO   APPENDECTOMY     AXILLARY SENTINEL NODE BIOPSY Right 05/23/2013   Procedure: AXILLARY SENTINEL NODE BIOPSY;  Surgeon: ToOdis HollingsheadMD;  [facility] Service: General;  Laterality: Right;  nuc med injection 7:00   BREAST RECONSTRUCTION WITH PLACEMENT OF TISSUE EXPANDER AND FLEX HD (ACELLULAR HYDRATED DERMIS) Bilateral 05/23/2013   Procedure: BILATERAL BREAST RECONSTRUCTION WITH PLACEMENT OF TISSUE EXPANDER AND FLEX HD;  Surgeon: [name], MD;  Location: [facility];  Service: Plastics;  Laterality: Bilateral;   CARDIAC CATHETERIZATION  1999   CATARACT EXTRACTION, BILATERAL Bilateral    CHOLECYSTECTOMY     COSMETIC SURGERY     OTHER SURGICAL HISTORY  09/2017   had cyst removal from spine    PERCUTANEOUS  CORONARY STENT INTERVENTION (PCI-S)  08/2017   done in [place] OF BILATERAL BREAST IMPLANTS Bilateral 05/17/2014   Procedure: REMOVAL OF BILATERAL TISSUE EXPANDERS WITH PLACEMENT OF BILATERAL BREAST IMPLANTS FOR RECONSTRUCTION;  Surgeon: [name], MD;  Location: [facility];  Service: Plastics;  Laterality: Bilateral;   TONSILLECTOMY     TOTAL KNEE ARTHROPLASTY Right 07/26/2018   Procedure: TOTAL KNEE ARTHROPLASTY;  Surgeon: Paralee Cancel, MD;  Location: [facility];  Service: Orthopedics;  Laterality: Right;  70 mins   TOTAL MASTECTOMY Bilateral 05/23/2013   Procedure: TOTAL MASTECTOMY;  Surgeon: [name], MD;  Location: [facility];  Service: General;  Laterality: Bilateral;    Current Outpatient Medications  Medication Sig Dispense Refill   allopurinol (ZYLOPRIM) 100 MG tablet TAKE 1 TABLET BY MOUTH ONCE DAILY 90 tablet 0   amoxicillin (AMOXIL) 500 MG tablet Take 4 tablets by mouth as directed. Take 1 hr before dental appt     apraclonidine (IOPIDINE) 0.5 % ophthalmic solution Place 1 drop into the right eye daily as needed (for droopy eye).   2   ASPIRIN LOW DOSE 81 MG chewable tablet Chew 81 mg by mouth daily.   0   celecoxib (CELEBREX) 200 MG capsule celecoxib 200 mg capsule  TAKE 1 CAPSULE BY MOUTH EVERY DAY     Cholecalciferol (VITAMIN D3) 50 MCG (2000 UT) TABS Take 2,000 Units by mouth daily.     clopidogrel (PLAVIX) 75 MG tablet Take 1 tablet (75 mg total) by mouth daily. Please call the office to schedule a one year follow up appointment 90 tablet 0   colchicine 0.6 MG tablet TAKE 1 TABLET BY MOUTH EVERY DAY OR AS NEEDED 30 tablet 0   cyanocobalamin (,VITAMIN B-12,) 1000 MCG/ML injection Inject 1,000 mcg into the muscle every 30 (thirty) days.     cyclobenzaprine (FLEXERIL) 10 MG tablet Take 1 tablet (10 mg total) by mouth 3 (three) times daily as needed for muscle spasms. 90 tablet 3   docusate sodium (COLACE) 100 MG capsule Take  1 capsule (100 mg total) by mouth 2 (two) times daily. 10 capsule 0   ezetimibe (ZETIA) 10 MG tablet TAKE 1 TABLET(10 MG) BY MOUTH DAILY 90 tablet 3   fluticasone (FLONASE) 50 MCG/ACT nasal spray Place 1 spray into both nostrils daily.     furosemide (LASIX) 40 MG tablet Take 40 mg by mouth daily as needed for fluid.      lisinopril (ZESTRIL) 10 MG tablet TAKE 1 TABLET(10 MG) BY MOUTH DAILY 90 tablet 3   metoprolol succinate (TOPROL-XL) 25 MG 24 hr tablet TAKE 1 TABLET(25 MG) BY MOUTH DAILY 90 tablet 3   Multiple Vitamin (MULTI-VITAMIN) tablet Take by mouth.     nitroGLYCERIN (NITROSTAT) 0.4 MG SL tablet PLACE 1 TABLET UNDER THE TONGUE EVERY 5 MINUTES AS NEEDED FOR CHEST PAIN WITH A MAX OF 3 DOSES  25 tablet 1   NONFORMULARY OR COMPOUNDED ITEM Vitamin E vaginal cream 200u/ml.  One ml pv three times weekly. 36 each 1   pantoprazole (PROTONIX) 40 MG tablet TAKE 1 TABLET(40 MG) BY MOUTH DAILY 90 tablet 2   phentermine (ADIPEX-P) 37.5 MG tablet Take 1 tablet by mouth as needed.     rosuvastatin (CRESTOR) 20 MG tablet TAKE 1 TABLET(20 MG) BY MOUTH DAILY 90 tablet 2   sertraline (ZOLOFT) 100 MG tablet Take 1 tablet by mouth daily.     testosterone cypionate (DEPOTESTOSTERONE CYPIONATE) 200 MG/ML injection INJECT 1 ML INTO THE MUSCLE EVERY 28 DAYS. PATIENT IS DUE FOR APPOINTMENT     No current facility-administered medications for this visit.    Allergies as of 04/02/2021 - Review Complete 04/02/2021  Allergen Reaction Noted   Pseudoeph-hydrocodone-gg Other (See Comments) 02/03/2019   Ivp dye [iodinated diagnostic agents] Hives 06/09/2012   Sulfa antibiotics Swelling 06/09/2012   Tape Other (See Comments) 07/04/2018    Vitals: BP 120/72   Pulse 62   Ht 5' 1.5" (1.562 m)   Wt 140 lb (63.5 kg)   LMP 08/17/1977 (Approximate)   BMI 26.02 kg/m  Last Weight:  Wt Readings from Last 1 Encounters:  04/02/21 140 lb (63.5 kg)   PJA:SNKN mass index is 26.02 kg/m.     Last Height:   Ht Readings  from Last 1 Encounters:  04/02/21 5' 1.5" (1.562 m)    Physical exam: hoarse voice. Looking well and well groomed.  No loss of smell or taste- never had covid 19, had 2 vaccines and a booster.  General: The patient is awake, alert and appears not in acute distress.  The patient is well groomed. She reports sweating a lot.  Head: Normocephalic, atraumatic. Neck is supple. Mallampati 4,  neck circumference: 15.5 Nasal airflow unrestricted, TMJ is now evident. Jaw clencher.  Retrognathia is not seen.  She has a crowded lower jaw and she has bruxism marks. Wore a dental device. . Cardiovascular:  Regular rate and rhythm , without  murmurs or carotid bruit, and without distended neck veins. Respiratory: Lungs are clear to auscultation. Skin:  Without evidence of edema, or rashTrunk: BMI is 28. The patient's posture is erect.   Neurologic exam :Speech is fluent,  without dysarthria, mild dysphonia but no aphasia.  Mood and affect are appropriate.  Cranial nerves:Pupils are equal and briskly reactive to light.  Extraocular movement are intact. Hearing to finger rub intact.  Facial sensation intact to fine touch. Facial motor strength is symmetric and tongue and uvula moves midline.  Shoulder shrug was symmetrical.  Muscle tone smooth, but I feel no cogwheeling, bilaterally mild tremor, no ataxia.  Normal finger to nose.  her gait is affected, she walks unsteady and drifts to left or right and when she nbends over she feels that she will fall.   Assessment and Plan:  7/8 I encouraged physical exercise, we did the orthostatics today laying down her blood pressure was 122/67 with a heart rate of 56, seated blood pressure was 112% over 73 mmHg with a heart rate of 61 this was only arise by 5 beats per minutes is a little less than I would expect and she dropped blood pressure her standing blood pressure was 120/65 and her heart rate rose only to another 3 bpm to 64 bpm.  After 3 minutes of  standing blood pressure was stabilized at 125/67 with a heart rate of 63.  So I do think there  is a quite significant delay in her heart rate and blood pressure adjusting to changes in posture still but that does not explain that she would have sudden bouts of balance problems.  She seems to fall to one side or feel weaker on one side.  I also do not see that correlated to her MRI necessarily.  I would certainly want her to have oxygen whenever at high altitude she did test negative for oxygen needs here at this level.  She is dancing again, but she reports dizziness. 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  Auto CPAP, 5-10 cm water.  She was highly compliant 100% for time and date of download obtained on 8-15 2022.  Her average pressure for CPAP is 9.9 cmH2O and she has not many air leaks.  Her residual apnea hypopnea index 7.0 the minimum pressure was 5 the maximum pressure of 10 cmH2O was 2 cm flex setting I think with mostly concerns for is that her machine has a 50-minute ramp and starts at 4 cmH2O I would like to take the ramp off.  I will also allow for a 12 cm maximum pressure setting.  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 Started flexaril 10 mg at night time.   3) unsteady gait- we checked today gait speed and turns.   REM BD, 4 cousins with PD one sister died of ALS. No tremor -Now some mild cogwheeling, may need balance and gait PT> had white matter disease on MRI.   I spent more than 30 minutes of face to face time with the patient. Greater than 50% of time was spent in counseling and coordination of care. We have discussed the diagnosis and differential and I answered the patient's questions.    6 month RV  with gait examination from now on.      Tracey Partridge Maame Dack  MD  04/02/2021   CC: Tracey [name], [place],  [place] [address]

## 2021-04-02 NOTE — Therapy (Signed)
[facility] [address] [place], [place], [address] Phone: [phone]   Fax:  [phone]  Physical Therapy Treatment/Progress Note  Patient Details  Name: Tracey [name] MRN: [id number] Date of Birth: 01/29/1946 Referring Provider (PT): Dr. Asencion Partridge Dohmeier  Physical Therapy Progress Note   Dates of Reporting Period: 12/30/20 - 04/02/21  See Note below for Objective Data and Assessment of Progress/Goals.  Thank you for the referral of this patient. Guillermina [place], PT, DPT  Encounter Date: 04/02/2021   PT End of Session - 04/02/21 1109     Visit Number 10    Number of Visits 14    Date for PT Re-Evaluation 04/09/21    Authorization Type Medicare A/B; [employer]    Progress Note Due on Visit 10    PT Start Time 1102    PT Stop Time 1144    PT Time Calculation (min) 42 min    Equipment Utilized During Treatment Gait belt    Activity Tolerance Patient tolerated treatment well    Behavior During Therapy WFL for tasks assessed/performed             Past Medical History:  Diagnosis Date   Allergy    Anxiety    takes Ativan daily prn   Arthritis    Breast cancer ([place]) 2014   ER+/PR+/HEr2-,    Bruises easily    Colon polyps    2 polyps by report   Diverticulosis    GERD (gastroesophageal reflux disease)    occasionally takes Nexium    Gout    takes Allopurinol daily and Colchicine daily prn;last attack 24yr ago   Gout    History of bladder infections    many yrs ago   History of bronchitis    last time at least 852yrago   History of stress incontinence    Hyperlipidemia    takes Pravastatin daily   Insomnia    takes Ambien nightly prn   NSTEMI (non-ST elevated myocardial infarction) ([facility]   OSA (obstructive sleep apnea)    on CPAP   Osteoarthritis    Peripheral edema    takes Furosemide daily prn   PONV (postoperative nausea and vomiting)    Postmenopausal hormone therapy     Radiation 07/27/13-09/07/13   Right Breast x 31 treatments   Rhinitis    uses Flonase prn   Sinus congestion    Status post breast reconstruction 10/15/14   Bilateral implant removal and DIEP performed in [place], [place]   Tachycardia    takes Metoprolol daily   Ventricular tachycardia, non-sustained ([facility]   during sleep study 2009 with normal cardiac workup    Past Surgical History:  Procedure Laterality Date   ABDOMINAL HYSTERECTOMY     with BSO   APPENDECTOMY     AXILLARY SENTINEL NODE BIOPSY Right 05/23/2013   Procedure: AXILLARY SENTINEL NODE BIOPSY;  Surgeon: ToOdis HollingsheadMD;  [facility] Service: General;  Laterality: Right;  nuc med injection 7:00   BREAST RECONSTRUCTION WITH PLACEMENT OF TISSUE EXPANDER AND FLEX HD (ACELLULAR HYDRATED DERMIS) Bilateral 05/23/2013   Procedure: BILATERAL BREAST RECONSTRUCTION WITH PLACEMENT OF TISSUE EXPANDER AND FLEX HD;  Surgeon: DaCrissie ReeseMD;  [facility] Service: Plastics;  Laterality: Bilateral;   CARDIAC CATHETERIZATION  1999   CATARACT EXTRACTION, BILATERAL Bilateral    CHOLECYSTECTOMY     COSMETIC SURGERY     OTHER SURGICAL HISTORY  09/2017   had  cyst removal from spine    PERCUTANEOUS CORONARY STENT INTERVENTION (PCI-S)  08/2017   done in [place] OF BILATERAL BREAST IMPLANTS Bilateral 05/17/2014   Procedure: REMOVAL OF BILATERAL TISSUE EXPANDERS WITH PLACEMENT OF BILATERAL BREAST IMPLANTS FOR RECONSTRUCTION;  Surgeon: [name], MD;  Location: [facility];  Service: Plastics;  Laterality: Bilateral;   TONSILLECTOMY     TOTAL KNEE ARTHROPLASTY Right 07/26/2018   Procedure: TOTAL KNEE ARTHROPLASTY;  Surgeon: Paralee Cancel, MD;  Location: [facility];  Service: Orthopedics;  Laterality: Right;  70 mins   TOTAL MASTECTOMY Bilateral 05/23/2013   Procedure: TOTAL MASTECTOMY;  Surgeon: [name], MD;  Location: [facility];  Service: General;  Laterality: Bilateral;     There were no vitals filed for this visit.   Subjective Assessment - 04/02/21 1104     Subjective Reports that appt with neurologist went well. MRI results were good, however does not explain the balance deficits. Patient reports she has not had the vertigo, but continues to have a heaviness in the head.    Patient is accompained by: Family member    Pertinent History Breast Cancer 2014, MI, Rt TKA    Limitations Walking    How long can you sit comfortably? unlimited    How long can you stand comfortably? unlimited    How long can you walk comfortably? unlimited    Patient Stated Goals To become more stable with her balance    Currently in Pain? No/denies                               [facility] Adult PT Treatment/Exercise - 04/02/21 0001       Therapeutic Activites    Therapeutic Activities Other Therapeutic Activities    Other Therapeutic Activities Standing on red mat completing standing and practicing golf swing to challenge balance, compelted x 4 reps with smaller (chip style) swing. then progressed to full swing x 4 reps iwth PT providing close supversion, no significnat imbalance during swings. However after completion mild imbalance with turn requiring CGA from PT.             Vestibular Treatment/Exercise - 04/02/21 0001       Vestibular Treatment/Exercise   Vestibular Treatment Provided Gaze    Gaze Exercises X1 Viewing Horizontal;X1 Viewing Vertical      X1 Viewing Horizontal   Foot Position standing feet together    Reps 1    Comments 60 seconds - continue to report double letters at end of time, continued cues to slow head movement.      X1 Viewing Vertical   Foot Position standing feet together    Reps 1    Comments 60 seconds - continue to report double letters at end of time, continued cues to slow head movement.                Balance Exercises - 04/02/21 0001       Balance Exercises: Standing   Standing Eyes Opened Narrow  base of support (BOS);Foam/compliant surface;Limitations    Standing Eyes Opened Limitations horizontal/vertical head turns x 10 reps each direction, increased challenge with vertical > horiz    Standing Eyes Closed Wide (BOA);Foam/compliant surface;3 reps;30 secs;Limitations    Standing Eyes Closed Limitations in // bars with wide BOS on airex, 3 x 30 seconds. intemrittent touch initially.    Rockerboard Anterior/posterior;EO;Head turns;Intermittent  UE support;Limitations    Rockerboard Limitations on rockerboard positioned A/P: completed static standing with eyes open x 30 seconds, maintaining board steady added in horiz/vertical head turns 2 x 5 reps each, significant challenge with vertical. Completed A/P weight shift x 15 reps without UE support.                 PT Short Term Goals - 03/10/21 2035       PT SHORT TERM GOAL #1   Title = LTG               PT Long Term Goals - 03/17/21 1639       PT LONG TERM GOAL #1   Title Patient to score >54 on BERG    Baseline 43 > 52    Time 4    Period Weeks    Status Revised    Target Date 04/09/21      PT LONG TERM GOAL #2   Title Patient to demo I in final HEP    Baseline ongoing    Time 4    Period Weeks    Status Revised    Target Date 04/09/21      PT LONG TERM GOAL #3   Title Patient to score 22 on DGI    Baseline Initial DGI 19    Time 4    Period Weeks    Status Revised    Target Date 04/09/21      PT LONG TERM GOAL #4   Title Pt will demonstrate improved vestibular function and use of VOR as indicated by 2 line difference on DVA    Baseline 4 line difference (8,4)    Time 4    Period Weeks    Status Revised    Target Date 04/09/21      PT LONG TERM GOAL #5   Title Pt will demonstrate ability to maintain balance when vision removed as indicated by increase in time to 15 seconds on MCTSIB condition 2 and 4    Baseline 4-5 seconds maximum    Time 4    Period Weeks    Status Revised    Target Date  04/09/21      PT LONG TERM GOAL #6   Title Pt will demonstrate negative positional testing for R and L side and report no dizziness/vertigo with rolling or supine <> sit    Baseline R BPPV    Time 4    Period Weeks    Status New    Target Date 04/09/21                   Plan - 04/02/21 1204     Clinical Impression Statement Patient continue to demo resolution of BPPV, with remaining balance impairment and motion senstivity but continues to demo steady progress with PT services. Continued focus on VOR and balance strategies. Initaited swinging golf club into session to work toward paitent reutnring to leisure activities. Will continue to progress toward all LTGs.    Personal Factors and Comorbidities Comorbidity 3+    Comorbidities MI, Rt TKA, cancer    Examination-Activity Limitations Locomotion Level;Stand;Bed Mobility    Examination-Participation Restrictions Cleaning;Shop;Community Activity;Yard Work;Interpersonal Relationship    Rehab Potential Good    PT Frequency 2x / week    PT Duration 4 weeks    PT Treatment/Interventions ADLs/Self Care Home Management;Therapeutic activities;Patient/family education;Balance training;Gait training;Neuromuscular re-education;DME Instruction;Therapeutic exercise;Vestibular;Stair training;Functional mobility training;Canalith Repostioning    PT Next Visit Plan Continue incorporating  golf activities into session.  Assess and treat R BPPV if symptoms return.  Habituation to head movements during functional activities.  Balance reactions on rockerboard.  Medicine ball chops on foam for - Golf.  Bending down with ball between legs with turns/compliant surface.    Consulted and Agree with Plan of Care Patient             Patient will benefit from skilled therapeutic intervention in order to improve the following deficits and impairments:  Decreased mobility, Decreased balance, Dizziness, Difficulty walking, Impaired vision/preception,  Impaired sensation, Decreased activity tolerance  Visit Diagnosis: Unsteadiness on feet  Other abnormalities of gait and mobility  Dizziness and giddiness     Problem List Patient Active Problem List   Diagnosis Date Noted   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   CPAP (continuous positive airway pressure) dependence 01/20/2021   Unsteady gait when walking 01/20/2021   Complaint related to dreams 97/67/3419   Metabolic acidosis with increased anion gap and accumulation of organic acids 08/17/2018   Nausea, vomiting, and diarrhea 08/17/2018   AKI (acute kidney injury) ([place]) 08/17/2018   Overweight (BMI 25.0-29.9) 07/27/2018   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/27/2018   S/P right TKA 07/26/2018   S/P knee replacement 07/26/2018   UARS (upper airway resistance syndrome) 05/30/2018   Hyperlipidemia 05/02/2018   Leg injury, right, initial encounter 01/06/2017   Contusion of right lower leg 01/06/2017   Hypoxemia 12/23/2015   Statin myopathy 05/20/2015   MCI (mild cognitive impairment) 05/20/2015   Edema 05/17/2015   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   Breast cancer genetic susceptibility 04/02/2015   OSA on CPAP 04/02/2015   CAD (coronary artery disease), native coronary artery 01/01/2014   Chest pain 01/01/2014   Hypertension    Ventricular tachycardia, non-sustained ([place])    Breast cancer of upper-outer quadrant of right female breast ([place]) 06/28/2013   Postoperative visit 06/02/2013   Gout    Tachycardia     Jones Bales, PT, DPT 04/02/2021, 12:07 PM  [facility] [address] [place], [place], [address] Phone: [phone]   Fax:  [phone]  Name: Tracey [name] MRN: [id number] Date of Birth: 04/16/1946

## 2021-04-07 ENCOUNTER — Ambulatory Visit: Payer: Medicare Other | Admitting: Neurology

## 2021-04-07 DIAGNOSIS — S80819A Abrasion, unspecified lower leg, initial encounter: Secondary | ICD-10-CM | POA: Diagnosis not present

## 2021-04-07 DIAGNOSIS — S50819A Abrasion of unspecified forearm, initial encounter: Secondary | ICD-10-CM | POA: Diagnosis not present

## 2021-04-07 DIAGNOSIS — D692 Other nonthrombocytopenic purpura: Secondary | ICD-10-CM | POA: Diagnosis not present

## 2021-04-08 ENCOUNTER — Other Ambulatory Visit: Payer: Self-pay

## 2021-04-08 ENCOUNTER — Ambulatory Visit: Payer: Medicare Other | Admitting: Physical Therapy

## 2021-04-08 DIAGNOSIS — R2681 Unsteadiness on feet: Secondary | ICD-10-CM

## 2021-04-08 DIAGNOSIS — R42 Dizziness and giddiness: Secondary | ICD-10-CM

## 2021-04-08 DIAGNOSIS — H8111 Benign paroxysmal vertigo, right ear: Secondary | ICD-10-CM | POA: Diagnosis not present

## 2021-04-08 DIAGNOSIS — R2689 Other abnormalities of gait and mobility: Secondary | ICD-10-CM | POA: Diagnosis not present

## 2021-04-09 NOTE — Therapy (Signed)
[facility] [address] [place], [place], [address] Phone: [phone]   Fax:  [phone]  Physical Therapy Treatment  Patient Details  Name: Tracey [name] MRN: [id number] Date of Birth: Jul 09, 1946 Referring Provider (PT): Dr. Asencion Partridge Dohmeier   Encounter Date: 04/08/2021   PT End of Session - 04/08/21 1454     Visit Number 11    Number of Visits 14    Date for PT Re-Evaluation 04/09/21    Authorization Type Medicare A/B; [employer]    Progress Note Due on Visit 20    PT Start Time 1450    PT Stop Time 1534    PT Time Calculation (min) 44 min    Activity Tolerance Patient tolerated treatment well    Behavior During Therapy [facility] for tasks assessed/performed             Past Medical History:  Diagnosis Date   Allergy    Anxiety    takes Ativan daily prn   Arthritis    Breast cancer ([place]) 2014   ER+/PR+/HEr2-,    Bruises easily    Colon polyps    2 polyps by report   Diverticulosis    GERD (gastroesophageal reflux disease)    occasionally takes Nexium    Gout    takes Allopurinol daily and Colchicine daily prn;last attack 3yr ago   Gout    History of bladder infections    many yrs ago   History of bronchitis    last time at least 884yrago   History of stress incontinence    Hyperlipidemia    takes Pravastatin daily   Insomnia    takes Ambien nightly prn   [place]ST [place]   OSA (obstructive sleep apnea)    on CPAP   Osteoarthritis    Peripheral edema    takes Furosemide daily prn   PONV (postoperative nausea and vomiting)    Postmenopausal hormone therapy    Radiation 07/27/13-09/07/13   Right Breast x 31 treatments   Rhinitis    uses Flonase prn   Sinus congestion    Status post breast reconstruction 10/15/14   Bilateral implant removal and DIEP performed in [place], [place]   Tachycardia    takes Metoprolol daily   [place]ent[place],  [place]ust[place]   during [place] study 2009 with normal cardiac workup    Past Surgical History:  Procedure Laterality Date   ABDOMINAL HYSTERECTOMY     with BSO   APPENDECTOMY     AXILLARY SENTINEL NODE BIOPSY Right 05/23/2013   Procedure: AXILLARY SENTINEL NODE BIOPSY;  Surgeon: ToOdis HollingsheadMD;  [place] [facility] Service: General;  Laterality: Right;  nuc med injection 7:00   BREAST RECONSTRUCTION WITH PLACEMENT OF TISSUE EXPANDER AND FLEX HD (ACELLULAR HYDRATED DERMIS) Bilateral 05/23/2013   Procedure: BILATERAL BREAST RECONSTRUCTION WITH PLACEMENT OF TISSUE EXPANDER AND FLEX HD;  Surgeon: DaCrissie ReeseMD;  [facility] Service: Plastics;  Laterality: Bilateral;   CARDIAC CATHETERIZATION  1999   CATARACT EXTRACTION, BILATERAL Bilateral    CHOLECYSTECTOMY     COSMETIC SURGERY     OTHER SURGICAL HISTORY  09/2017   had cyst removal from spine    PERCUTANEOUS CORONARY STENT INTERVENTION (PCI-S)  08/2017   [place] Bilateral 05/17/2014   Procedure: REMOVAL OF BILATERAL TISSUE EXPANDERS WITH PLACEMENT OF  BILATERAL BREAST IMPLANTS FOR RECONSTRUCTION;  Surgeon: [name], MD;  Location: [facility];  Service: Plastics;  Laterality: Bilateral;   TONSILLECTOMY     TOTAL KNEE ARTHROPLASTY Right 07/26/2018   Procedure: TOTAL KNEE ARTHROPLASTY;  Surgeon: Paralee Cancel, MD;  Location: [facility];  Service: Orthopedics;  Laterality: Right;  70 mins   TOTAL MASTECTOMY Bilateral 05/23/2013   Procedure: TOTAL MASTECTOMY;  Surgeon: [name], MD;  Location: [facility];  Service: General;  Laterality: Bilateral;    There were no vitals filed for this visit.   Subjective Assessment - 04/08/21 1456     Subjective Had a little dizziness this morning; would like to re-check the crystals.  Also states that at appointment with neurology she was found to have orthostatic hypotension.    Patient is  accompained by: Family member    Pertinent History Breast Cancer 2014, MI, Rt TKA    Limitations Walking    How long can you sit comfortably? unlimited    How long can you stand comfortably? unlimited    How long can you walk comfortably? unlimited    Patient Stated Goals To become more stable with her balance    Currently in Pain? No/denies                [facility] PT Assessment - 04/08/21 1504       Assessment   Medical Diagnosis Unsteady gait, dizziness    Referring Provider (PT) Dr. 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, education      Observation/Other Assessments   Focus on Therapeutic Outcomes (FOTO)  49%; FOTO closed      Coordination   Gross Motor Movements are Fluid and Coordinated Yes   RAM WFL   Finger Nose Finger Test [facility]    Heel Shin Test [facility]      Standardized Balance Assessment   Standardized Balance Assessment Berg Balance Test;Dynamic Gait Index      Berg Balance Test   Sit to Stand Able to stand without using hands and stabilize independently    Standing Unsupported Able to stand safely 2 minutes    Sitting with Back Unsupported but Feet Supported on Floor or Stool Able to sit safely and securely 2 minutes    Stand to Sit Sits safely with minimal use of hands    Transfers Able to transfer safely, minor use of hands    Standing Unsupported with Eyes Closed Able to stand 10 seconds safely    Standing Unsupported with Feet Together Able to place feet together independently and stand 1 minute safely    From Standing, Reach Forward with Outstretched Arm Can reach confidently >25 cm (10")    From Standing Position, Pick up Object  from Floor Able to pick up shoe safely and easily    From Standing Position, Turn to Look Behind Over each Shoulder Needs assist to keep from losing balance and falling    Turn 360 Degrees Able to turn 360 degrees safely but slowly   dizziness   Standing Unsupported, Alternately Place Feet on Step/Stool Able to stand independently and safely and complete 8 steps in 20 seconds  Standing Unsupported, One Foot in Front Able to place foot tandem independently and hold 30 seconds   L foot forwards, not able with R foot forwards   Standing on One Leg Able to lift leg independently and hold > 10 seconds   L LE; 6 seconds RLE   Total Score 50    Berg comment: 50/56   more imbalance with turning today     Dynamic Gait Index   Level Surface Normal    Change in Gait Speed Mild Impairment    Gait with Horizontal Head Turns Mild Impairment    Gait with Vertical Head Turns Normal    Gait and Pivot Turn Normal    Step Over Obstacle Normal    Step Around Obstacles Normal    Steps Mild Impairment    Total Score 21    DGI comment: 21/24               Vestibular Assessment - 04/08/21 1520       Visual Acuity   Static 9    Dynamic 7   2 line difference decreased from 4     Positional Testing   Dix-Hallpike Dix-Hallpike Right;Dix-Hallpike Left    Horizontal Canal Testing Horizontal Canal Right;Horizontal Canal Left      Dix-Hallpike Right   Dix-Hallpike Right Duration 0    Dix-Hallpike Right Symptoms No nystagmus      Dix-Hallpike Left   Dix-Hallpike Left Duration 0    Dix-Hallpike Left Symptoms No nystagmus      Horizontal Canal Right   Horizontal Canal Right Duration 0    Horizontal Canal Right Symptoms Normal      Horizontal Canal Left   Horizontal Canal Left Duration 0    Horizontal Canal Left Symptoms Normal      Positional Sensitivities   Sit to Supine No dizziness    Supine to Left Side No dizziness    Supine to Right Side No dizziness    Supine to Sitting Moderate  dizziness    Right Hallpike Lightheadedness    Up from Right Hallpike Moderate dizziness    Up from Left Hallpike Moderate dizziness    Pivot Right in Standing Moderate dizziness    Pivot Left in Standing Moderate dizziness                PT Education - 04/08/21 1532     Education Details Discussed gradual return to dancing to avoid overstimulating vestibular system              PT Short Term Goals - 03/10/21 2035       PT SHORT TERM GOAL #1   Title = LTG               PT Long Term Goals - 04/08/21 1521       PT LONG TERM GOAL #1   Title Patient to score >54 on BERG    Baseline 43 > 52 > 50    Time 4    Period Weeks    Status Not Met      PT LONG TERM GOAL #2   Title Patient to demo I in final HEP    Baseline ongoing    Time 4    Period Weeks    Status On-going      PT LONG TERM GOAL #3   Title Patient to score 22 on DGI    Baseline Initial DGI 19 > 21/24    Time 4  Period Weeks    Status Partially Met      PT LONG TERM GOAL #4   Title Pt will demonstrate improved vestibular function and use of VOR as indicated by 2 line difference on DVA    Baseline 4 line difference (8,4) > 2 line difference (9, 7)    Time 4    Period Weeks    Status Achieved      PT LONG TERM GOAL #5   Title Pt will demonstrate ability to maintain balance when vision removed as indicated by increase in time to 15 seconds on MCTSIB condition 2 and 4    Baseline 4-5 seconds maximum    Time 4    Period Weeks    Status Revised      PT LONG TERM GOAL #6   Title Pt will demonstrate negative positional testing for R and L side and report no dizziness/vertigo with rolling or supine <> sit    Baseline R BPPV    Time 4    Period Weeks    Status Achieved             New Goals for recertification:    PT Long Term Goals - 04/09/21 5625       PT LONG TERM GOAL #1   Title Patient to score >54 on BERG    Baseline 43 > 52 > 50    Time 4    Period Weeks    Status  Revised    Target Date 05/09/21      PT LONG TERM GOAL #2   Title Patient to demo I in final HEP    Baseline ongoing    Time 4    Period Weeks    Status Revised    Target Date 05/09/21      PT LONG TERM GOAL #3   Title Patient to score 22 on DGI    Baseline Initial DGI 19 > 21/24    Time 4    Period Weeks    Status Revised    Target Date 05/09/21      PT LONG TERM GOAL #4   Title Pt will demonstrate improved vestibular function and use of VOR as indicated by 2 line difference on DVA    Baseline 4 line difference (8,4) > 2 line difference (9, 7)    Status Achieved      PT LONG TERM GOAL #5   Title Pt will demonstrate ability to maintain balance when vision removed as indicated by increase in time to 15 seconds on MCTSIB condition 2 and 4    Baseline 4-5 seconds maximum    Time 4    Period Weeks    Status Revised    Target Date 05/09/21      PT LONG TERM GOAL #6   Title Pt will demonstrate negative positional testing for R and L side and report no dizziness/vertigo with rolling or supine <> sit    Baseline Resolved    Status Achieved                 Plan - 04/08/21 1530     Clinical Impression Statement Performed assessment of progress towards LTG.  Pt is making steady progress and has met 2/6 LTG.  She demonstrates improved VOR gain as indicated by 2 line difference on DVA and demonstrates full resolution of R BPPV.  Pt demonstrates improvement in DGI but not to goal.  Pt demonstrated decline in BERG score due to increased  dizziness and LOB with turning.  Pt continues to present with significant disequilibrium, motion sensitivity and impaired sensory integration for balance reactions.  Pt will benefit from continued skilled PT services to address ongoing impairments, to maximize functional mobility independence and decrease falls risk.    Personal Factors and Comorbidities Comorbidity 3+    Comorbidities MI, Rt TKA, cancer    Examination-Activity Limitations  Locomotion Level;Stand;Bed Mobility    Examination-Participation Restrictions Cleaning;Shop;Community Activity;Yard Work;Interpersonal Relationship    Rehab Potential Good    PT Frequency 2x / week    PT Duration 4 weeks    PT Treatment/Interventions ADLs/Self Care Home Management;Therapeutic activities;Patient/family education;Balance training;Gait training;Neuromuscular re-education;DME Instruction;Therapeutic exercise;Vestibular;Stair training;Functional mobility training;Canalith Repostioning    PT Next Visit Plan Brooke: Schedule more visits, 2x/week x 4 with me and you.  Re-assess MCTSIB and update HEP: tandem, turns, VOR - educate pt on ways to compensate for orthostatic hypotension and safety when transferring.    Continue incorporating golf activities into session.  Assess and treat R BPPV if symptoms return.  Habituation to head movements during functional activities.  Balance reactions on rockerboard.  Medicine ball chops on foam for - Golf.  Bending down with ball between legs with turns/compliant surface.    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   CPAP (continuous positive airway pressure) dependence 01/20/2021   Unsteady gait when walking 01/20/2021   Complaint related to dreams 63/78/5885   Metabolic acidosis with increased anion gap and accumulation of organic acids 08/17/2018   Nausea, vomiting, and diarrhea 08/17/2018   AKI (acute  kidney injury) ([place]) 08/17/2018   Overweight (BMI 25.0-29.9) 07/27/2018   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/27/2018   S/P right TKA 07/26/2018   S/P knee replacement 07/26/2018   UARS (upper airway resistance syndrome) 05/30/2018   Hyperlipidemia 05/02/2018   Leg injury, right, initial encounter 01/06/2017   Contusion of right lower leg 01/06/2017   Hypoxemia 12/23/2015   Statin myopathy 05/20/2015   MCI (mild cognitive impairment) 05/20/2015   Edema 05/17/2015   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 [place], [place], [address] Phone: [phone]   Fax:  [phone]  Name: RENEE ERB MRN: [id number] Date of Birth: 09-20-1945

## 2021-04-11 ENCOUNTER — Other Ambulatory Visit: Payer: Self-pay | Admitting: Cardiovascular Disease

## 2021-04-11 ENCOUNTER — Ambulatory Visit: Payer: Medicare Other

## 2021-04-11 ENCOUNTER — Other Ambulatory Visit: Payer: Self-pay

## 2021-04-11 DIAGNOSIS — R2689 Other abnormalities of gait and mobility: Secondary | ICD-10-CM

## 2021-04-11 DIAGNOSIS — H8111 Benign paroxysmal vertigo, right ear: Secondary | ICD-10-CM | POA: Diagnosis not present

## 2021-04-11 DIAGNOSIS — R2681 Unsteadiness on feet: Secondary | ICD-10-CM | POA: Diagnosis not present

## 2021-04-11 DIAGNOSIS — R42 Dizziness and giddiness: Secondary | ICD-10-CM

## 2021-04-11 NOTE — Patient Instructions (Signed)
Gaze Stabilization: Tip Card  1.Target must remain in focus, not blurry, and appear stationary while head is in motion. 2.Perform exercises with small head movements (45 to either side of midline). 3.Increase speed of head motion so long as target is in focus. 4.If you wear eyeglasses, be sure you can see target through lens (therapist will give specific instructions for bifocal / progressive lenses). 5.These exercises may provoke dizziness or nausea. Work through these symptoms. If too dizzy, slow head movement slightly. Rest between each exercise. 6.Exercises demand concentration; avoid distractions. 7.For safety, perform standing exercises close to a counter, wall, corner, or next to someone.    Gaze Stabilization: Standing Feet Together    Feet together, keeping eyes on target on wall 3 feet away, tilt head down 15-30 and move head side to side for 60 seconds. Repeat while moving head up and down for 60 seconds. Steady pace, try to keep the body as still as possible Do 2-3 sessions per day.    2.         Bending to Pick up              Stand with feet apart and an object resting on a low chair/ottoman 2 feet off the ground.  Face the object - look down at object, reach down and pick up, standing back up.  Look down and put it back down.  Repeat 5 times.     Turn so the object is to the RIGHT.  Using your LEFT hand, reach across and pick up the object.  Look back over to the right and put it back down.  Repeat 5 times with object to the right.     Turn around so the object is to the LEFT.  Repeat using your RIGHT hand, 5 times.     Feet Together (Compliant Surface) - Eyes Closed    Stand on compliant surface: stand with feet together and arms by your side. Close eyes and visualize upright position. Hold 30 seconds. NO HEAD MOTION.  Repeat 2 times per session. Do 2 sessions per day.    Feet Together, Head Motion - Eyes Closed    With eyes closed and feet together, move  head slowly, up and down 10 times. Then move head slowly right and left 10 times.  Repeat 2 times per session. Do 2 sessions per day.   Tandem Stance    Right foot in front of left, heel touching toe both feet "straight ahead". Stand on Foot Triangle of Support with both feet. Balance in this position 30 seconds. Do with left foot in front of right. Completed 2 reps each.   Copyright  VHI. All rights reserved.

## 2021-04-11 NOTE — Therapy (Signed)
[facility] [address] [place], [place], [address] Phone: [phone]   Fax:  [phone]  Physical Therapy Treatment  Patient Details  Name: Tracey [name] MRN: [id number] Date of Birth: Nov 28, 1945 Referring Provider (PT): Dr. Asencion Partridge Dohmeier   Encounter Date: 04/11/2021   PT End of Session - 04/11/21 1024     Visit Number 12    Number of Visits 19    Date for PT Re-Evaluation 05/09/21    Authorization Type Medicare A/B; [employer]    Progress Note Due on Visit 20    PT Start Time 1017    PT Stop Time 1100    PT Time Calculation (min) 43 min    Equipment Utilized During Treatment Gait belt    Activity Tolerance Patient tolerated treatment well    Behavior During Therapy WFL for tasks assessed/performed             Past Medical History:  Diagnosis Date   Allergy    Anxiety    takes Ativan daily prn   Arthritis    Breast cancer ([place]) 2014   ER+/PR+/HEr2-,    Bruises easily    Colon polyps    2 polyps by report   Diverticulosis    GERD (gastroesophageal reflux disease)    occasionally takes Nexium    Gout    takes Allopurinol daily and Colchicine daily prn;last attack 39yr ago   Gout    History of bladder infections    many yrs ago   History of bronchitis    last time at least 8107yrago   History of stress incontinence    Hyperlipidemia    takes Pravastatin daily   Insomnia    takes Ambien nightly prn   [place]STEMI (non-ST elevated [place] infarction) [place]   OSA (obstructive sleep apnea)    on CPAP   Osteoarthritis    Peripheral edema    takes Furosemide daily prn   PONV (postoperative nausea and vomiting)    Postmenopausal hormone therapy    Radiation 07/27/13-09/07/13   Right Breast x 31 treatments   Rhinitis    uses Flonase prn   Sinus congestion    Status post breast reconstruction 10/15/14   Bilateral implant removal and DIEP performed in [place]   Tachycardia     takes Metoprolol daily   Vent[place] tachy[place], [place]sustained ([place] [place]   during sleep study 2009 with normal cardiac workup    Past Surgical History:  Procedure Laterality Date   ABDOMINAL HYSTERECTOMY     with BSO   APPENDECTOMY     AXILLARY SENTINEL NODE BIOPSY Right 05/23/2013   Procedure: AXILLARY SENTINEL NODE BIOPSY;  Surgeon: ToOdis HollingsheadMD;  [facility]: [facility];  Laterality: Right;  nuc med injection 7:00   BREAST RECONSTRUCTION WITH PLACEMENT OF TISSUE EXPANDER AND FLEX HD (ACELLULAR HYDRATED DERMIS) Bilateral 05/23/2013   Procedure: BILATERAL BREAST RECONSTRUCTION WITH PLACEMENT OF TISSUE EXPANDER AND FLEX HD;  Surgeon: DaCrissie ReeseMD;  [facility] Service: [facility];  Laterality: Bilateral;   CARDIAC CATHETERIZATION  1999   CATARACT EXTRACTION, BILATERAL Bilateral    CHOLECYSTECTOMY     COSMETIC SURGERY     OTHER SURGICAL HISTORY  09/2017   had cyst removal from spine    PERCUTANEOUS CORONARY STENT INTERVENTION (PCI-S)  08/2017   [place] BRE[place] IMPL[place] Bilateral 05/17/2014  Procedure: REMOVAL OF BILATERAL TISSUE EXPANDERS WITH PLACEMENT OF BILATERAL BREAST IMPLANTS FOR RECONSTRUCTION;  Surgeon: [name], MD;  Location: [facility];  Service: Plastics;  Laterality: Bilateral;   TONSILLECTOMY     TOTAL KNEE ARTHROPLASTY Right 07/26/2018   Procedure: TOTAL KNEE ARTHROPLASTY;  Surgeon: Paralee Cancel, MD;  Location: [facility];  Service: Orthopedics;  Laterality: Right;  70 mins   TOTAL MASTECTOMY Bilateral 05/23/2013   Procedure: TOTAL MASTECTOMY;  Surgeon: [name], MD;  Location: [facility];  Service: General;  Laterality: Bilateral;    There were no vitals filed for this visit.   Subjective Assessment - 04/11/21 1020     Subjective Patient reports no new changes. Does report still feel like off balance sensation is coming/going.    Patient is accompained by: Family  member    Pertinent History Breast Cancer 2014, MI, Rt TKA    Limitations Walking    How long can you sit comfortably? unlimited    How long can you stand comfortably? unlimited    How long can you walk comfortably? unlimited    Patient Stated Goals To become more stable with her balance    Currently in Pain? No/denies                [facility] Adult PT Treatment/Exercise - 04/11/21 0001       Therapeutic Activites    Therapeutic Activities Other Therapeutic Activities    Other Therapeutic Activities PT educating on Orthostatic Hypotension and provided handout, as well as educating on ways to compensate for [facility] and safety with transfers/movement. Patient verbalize understanding.      Neuro Re-ed    Neuro Re-ed Details  Completed M-CTSIB: situation 1: 30 seconds, situation 2: 30 seconds (increased sway), situation 3: 30 seconds, situation 4: 9 seconds average of 3 trials.             Vestibular Treatment/Exercise - 04/11/21 0001       Vestibular Treatment/Exercise   Vestibular Treatment Provided Gaze    Gaze Exercises X1 Viewing Horizontal;X1 Viewing Vertical      X1 Viewing Horizontal   Foot Position standing feet together    Reps 1    Comments 60 seconds      X1 Viewing Vertical   Foot Position standing feet together    Comments 60 seconds            Reviewed entire HEP and Updated to patient's tolerance:   Gaze Stabilization: Tip Card  1.Target must remain in focus, not blurry, and appear stationary while head is in motion. 2.Perform exercises with small head movements (45 to either side of midline). 3.Increase speed of head motion so long as target is in focus. 4.If you wear eyeglasses, be sure you can see target through lens (therapist will give specific instructions for bifocal / progressive lenses). 5.These exercises may provoke dizziness or nausea. Work through these symptoms. If too dizzy, slow head movement slightly. Rest between each  exercise. 6.Exercises demand concentration; avoid distractions. 7.For safety, perform standing exercises close to a counter, wall, corner, or next to someone.    Gaze Stabilization: Standing Feet Together    Feet together, keeping eyes on target on wall 3 feet away, tilt head down 15-30 and move head side to side for 60 seconds. Repeat while moving head up and down for 60 seconds. Steady pace, try to keep the body as still as possible Do 2-3 sessions per day.     Bending to Pick up  Stand with feet apart and an object resting on a low chair/ottoman 2 feet off the ground.  Face the object - look down at object, reach down and pick up, standing back up.  Look down and put it back down.  Repeat 5 times.     Turn so the object is to the RIGHT.  Using your LEFT hand, reach across and pick up the object.  Look back over to the right and put it back down.  Repeat 5 times with object to the right.     Turn around so the object is to the LEFT.  Repeat using your RIGHT hand, 5 times.     Feet Together (Compliant Surface) - Eyes Closed    Stand on compliant surface: stand with feet together and arms by your side. Close eyes and visualize upright position. Hold 30 seconds. NO HEAD MOTION.  Repeat 2 times per session. Do 2 sessions per day.    Feet Together, Head Motion - Eyes Closed    With eyes closed and feet together, move head slowly, up and down 10 times. Then move head slowly right and left 10 times.  Repeat 2 times per session. Do 2 sessions per day.   Tandem Stance    Right foot in front of left, heel touching toe both feet "straight ahead". Stand on Foot Triangle of Support with both feet. Balance in this position 30 seconds. Do with left foot in front of right. Completed 2 reps each.   Copyright  VHI. All rights reserved.      PT Education - 04/11/21 1110     Education Details Orthostatic Hypotension Compensation and Provided Handout; Updated HEP     Person(s) Educated Patient    Methods Explanation;Demonstration;Handout    Comprehension Verbalized understanding;Returned demonstration              PT Short Term Goals - 03/10/21 2035       PT SHORT TERM GOAL #1   Title = LTG               PT Long Term Goals - 04/11/21 1118       PT LONG TERM GOAL #1   Title Patient to score >54 on BERG    Baseline 43 > 52 > 50    Time 4    Period Weeks    Status Revised      PT LONG TERM GOAL #2   Title Patient to demo I in final HEP    Baseline ongoing    Time 4    Period Weeks    Status Revised      PT LONG TERM GOAL #3   Title Patient to score 22 on DGI    Baseline Initial DGI 19 > 21/24    Time 4    Period Weeks    Status Revised      PT LONG TERM GOAL #4   Title Pt will demonstrate improved vestibular function and use of VOR as indicated by 2 line difference on DVA    Baseline 4 line difference (8,4) > 2 line difference (9, 7)    Status Achieved      PT LONG TERM GOAL #5   Title Pt will demonstrate ability to maintain balance when vision removed as indicated by increase in time to 15 seconds on MCTSIB condition 2 and 4    Baseline 4-5 seconds maximum; 9 seconds on 8/26    Time 4    Period Weeks  Status Revised      PT LONG TERM GOAL #6   Title Pt will demonstrate negative positional testing for R and L side and report no dizziness/vertigo with rolling or supine <> sit    Baseline Resolved    Status Achieved                   Plan - 04/11/21 1116     Clinical Impression Statement Provided education on Orthostatic Hypotension and compensation techniques to promote safety, with handout provided. Reasssed M-CTSIB, patient most challenge noted with situation 4, able to hold on average 9 seconds today. Reviewed and updated HEP as patient is demosntrating progression with VOR and balance exercises. Will continue to progress toward all LTGs.    Personal Factors and Comorbidities Comorbidity 3+     Comorbidities MI, Rt TKA, cancer    Examination-Activity Limitations Locomotion Level;Stand;Bed Mobility    Examination-Participation Restrictions Cleaning;Shop;Community Activity;Yard Work;Interpersonal Relationship    Rehab Potential Good    PT Frequency 2x / week    PT Duration 4 weeks    PT Treatment/Interventions ADLs/Self Care Home Management;Therapeutic activities;Patient/family education;Balance training;Gait training;Neuromuscular re-education;DME Instruction;Therapeutic exercise;Vestibular;Stair training;Functional mobility training;Canalith Repostioning    PT Next Visit Plan Make sure patient got scheduled.Marland Kitchen  How was HEP update? contiue tandem, turns, VOR.   Continue incorporating golf activities into session.  Assess and treat R BPPV if symptoms return.  Habituation to head movements during functional activities.  Balance reactions on rockerboard.  Medicine ball chops on foam for - Golf.  Bending down with ball between legs with turns/compliant surface.    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   CPAP (continuous positive airway pressure) dependence 01/20/2021   Unsteady gait when walking 01/20/2021   Complaint related to dreams 88/32/5498   Metabolic acidosis with increased anion gap and accumulation of organic acids 08/17/2018   Nausea, vomiting, and diarrhea 08/17/2018   AKI (acute kidney injury) ([facility]) 08/17/2018   Overweight (BMI  25.0-29.9) 07/27/2018   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/27/2018   S/P right TKA 07/26/2018   S/P knee replacement 07/26/2018   UARS (upper airway resistance syndrome) 05/30/2018   Hyperlipidemia 05/02/2018   Leg injury, right, initial encounter 01/06/2017   Contusion of right lower leg 01/06/2017   Hypoxemia 12/23/2015   Statin myopathy 05/20/2015   MCI (mild cognitive impairment) 05/20/2015   Edema 05/17/2015   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 [place], [place], [address] Phone: [phone]  Fax:  [phone]  Name: OTHELL DILUZIO MRN: [id number] Date of Birth: 20-Jul-1946

## 2021-04-14 DIAGNOSIS — Z961 Presence of intraocular lens: Secondary | ICD-10-CM | POA: Diagnosis not present

## 2021-04-14 DIAGNOSIS — H524 Presbyopia: Secondary | ICD-10-CM | POA: Diagnosis not present

## 2021-04-14 DIAGNOSIS — H5310 Unspecified subjective visual disturbances: Secondary | ICD-10-CM | POA: Diagnosis not present

## 2021-04-15 ENCOUNTER — Other Ambulatory Visit: Payer: Self-pay

## 2021-04-15 ENCOUNTER — Ambulatory Visit: Payer: Medicare Other

## 2021-04-15 DIAGNOSIS — R2681 Unsteadiness on feet: Secondary | ICD-10-CM | POA: Diagnosis not present

## 2021-04-15 DIAGNOSIS — H8111 Benign paroxysmal vertigo, right ear: Secondary | ICD-10-CM | POA: Diagnosis not present

## 2021-04-15 DIAGNOSIS — R42 Dizziness and giddiness: Secondary | ICD-10-CM | POA: Diagnosis not present

## 2021-04-15 DIAGNOSIS — R2689 Other abnormalities of gait and mobility: Secondary | ICD-10-CM | POA: Diagnosis not present

## 2021-04-15 NOTE — Therapy (Signed)
[facility] [address] [place], [place], [address] Phone: [phone]   Fax:  [phone]  Physical Therapy Treatment  Patient Details  Name: Tracey [name] MRN: [id number] Date of Birth: Nov 07, 1945 Referring Provider (PT): Dr. Asencion Partridge Dohmeier   Encounter Date: 04/15/2021   PT End of Session - 04/15/21 1402     Visit Number 13    Number of Visits 19    Date for PT Re-Evaluation 05/09/21    Authorization Type Medicare A/B; [employer]    Progress Note Due on Visit 20    PT Start Time 1402    PT Stop Time 1445    PT Time Calculation (min) 43 min    Equipment Utilized During Treatment Gait belt    Activity Tolerance Patient tolerated treatment well    Behavior During Therapy WFL for tasks assessed/performed             Past Medical History:  Diagnosis Date   Allergy    Anxiety    takes Ativan daily prn   Arthritis    Breast cancer ([place]) 2014   ER+/PR+/HEr2-,    Bruises easily    Colon polyps    2 polyps by report   Diverticulosis    GERD (gastroesophageal reflux disease)    occasionally takes Nexium    Gout    takes Allopurinol daily and Colchicine daily prn;last attack 34yr ago   Gout    History of bladder infections    many yrs ago   History of bronchitis    last time at least 865yrago   History of stress incontinence    Hyperlipidemia    takes Pravastatin daily   Insomnia    takes Ambien nightly prn   NSTEMI (non-ST elevated myocardial infarction) ([facility]   OSA (obstructive sleep apnea)    on CPAP   Osteoarthritis    Peripheral edema    takes Furosemide daily prn   PONV (postoperative nausea and vomiting)    Postmenopausal hormone therapy    Radiation 07/27/13-09/07/13   Right Breast x 31 treatments   Rhinitis    uses Flonase prn   Sinus congestion    Status post breast reconstruction 10/15/14   Bilateral implant removal and DIEP performed in [place]   Tachycardia     takes Metoprolol daily   Ventricular tachycardia, non-sustained ([facility]   during sleep study 2009 with normal cardiac workup    Past Surgical History:  Procedure Laterality Date   ABDOMINAL HYSTERECTOMY     with BSO   APPENDECTOMY     AXILLARY SENTINEL NODE BIOPSY Right 05/23/2013   Procedure: AXILLARY SENTINEL NODE BIOPSY;  Surgeon: ToOdis HollingsheadMD;  [facility] Service: General;  Laterality: Right;  nuc med injection 7:00   BREAST RECONSTRUCTION WITH PLACEMENT OF TISSUE EXPANDER AND FLEX HD (ACELLULAR HYDRATED DERMIS) Bilateral 05/23/2013   Procedure: BILATERAL BREAST RECONSTRUCTION WITH PLACEMENT OF TISSUE EXPANDER AND FLEX HD;  Surgeon: DaCrissie ReeseMD;  [facility]: Pl[facility];  Laterality: Bilateral;   CARDIAC CATHETERIZATION  1999   CATARACT EXTRACTION, BILATERAL Bilateral    CHOLECYSTECTOMY     COSMETIC SURGERY     OTHER SURGICAL HISTORY  09/2017   had cyst removal from spine    PERCUTANEOUS CORONARY STENT INTERVENTION (PCI-S)  08/2017   [place] BRE[place]PL[place] Bilateral 05/17/2014  Procedure: REMOVAL OF BILATERAL TISSUE EXPANDERS WITH PLACEMENT OF BILATERAL BREAST IMPLANTS FOR RECONSTRUCTION;  Surgeon: [name], MD;  Location: [facility];  Service: Plastics;  Laterality: Bilateral;   TONSILLECTOMY     TOTAL KNEE ARTHROPLASTY Right 07/26/2018   Procedure: TOTAL KNEE ARTHROPLASTY;  Surgeon: Paralee Cancel, MD;  [facility];  Service: Orthopedics;  Laterality: Right;  70 mins   TOTAL MASTECTOMY Bilateral 05/23/2013   Procedure: TOTAL MASTECTOMY;  Surgeon: [name], MD;  Location: [facility];  Service: General;  Laterality: Bilateral;    There were no vitals filed for this visit.   Subjective Assessment - 04/15/21 1404     Subjective Played golf this morning and over the weekend. Felt like the balance was good and did not have any dizziness. Reports new HEP went well.     Patient is accompained by: Family member    Pertinent History Breast Cancer 2014, MI, Rt TKA    Limitations Walking    How long can you sit comfortably? unlimited    How long can you stand comfortably? unlimited    How long can you walk comfortably? unlimited    Patient Stated Goals To become more stable with her balance    Currently in Pain? No/denies              [facility] Adult PT Treatment/Exercise - 04/15/21 0001       Neuro Re-ed    Neuro Re-ed Details  Completed gait with eyes closed in hallway, 4 x 30', with intermittent light touch to wall and clsoe supervision from PT.             Balance Exercises - 04/15/21 0001       Balance Exercises: Standing   Standing Eyes Closed Wide (BOA);Foam/compliant surface;3 reps;30 secs;Limitations;Narrow base of support (BOS)    Standing Eyes Closed Limitations in // bars working toward more narrow BOS on airex, 3 x 30 seconds with eyes closed. then completed wide BOS and horizontal/vertical head turns x 10 reps each direction, intemrittent touch.    Rockerboard Anterior/posterior;Lateral;EO;EC;Limitations    Rockerboard Limitations on rockerboard positioned A/P: completedwith eyes open completed A/P weight shift x 15 reps without UE support. then compelted eyes closed and holding board steady 3 x 30 seconds, then completed A/P weight shift x 10 reps with vision removed. completed with rockerboard positioned lateral: completedwith eyes open completed R/L weight shift x 15 reps. Trialed holding board steady with eyes closed, completed 3 x 30 seconds.    Other Standing Exercises with feet hip width on airex: completed ball chops with 2# weighted ball to simulate golf swing, 2 x 10 reps.                 PT Short Term Goals - 03/10/21 2035       PT SHORT TERM GOAL #1   Title = LTG               PT Long Term Goals - 04/11/21 1118       PT LONG TERM GOAL #1   Title Patient to score >54 on BERG    Baseline 43 > 52 > 50     Time 4    Period Weeks    Status Revised      PT LONG TERM GOAL #2   Title Patient to demo I in final HEP    Baseline ongoing    Time 4    Period Weeks    Status Revised  PT LONG TERM GOAL #3   Title Patient to score 22 on DGI    Baseline Initial DGI 19 > 21/24    Time 4    Period Weeks    Status Revised      PT LONG TERM GOAL #4   Title Pt will demonstrate improved vestibular function and use of VOR as indicated by 2 line difference on DVA    Baseline 4 line difference (8,4) > 2 line difference (9, 7)    Status Achieved      PT LONG TERM GOAL #5   Title Pt will demonstrate ability to maintain balance when vision removed as indicated by increase in time to 15 seconds on MCTSIB condition 2 and 4    Baseline 4-5 seconds maximum; 9 seconds on 8/26    Time 4    Period Weeks    Status Revised      PT LONG TERM GOAL #6   Title Pt will demonstrate negative positional testing for R and L side and report no dizziness/vertigo with rolling or supine <> sit    Baseline Resolved    Status Achieved                   Plan - 04/15/21 1618     Clinical Impression Statement Today's session focused on continued balance activities on foam with vision removed and owkrin gon improved balance reactions. As well as continue to simulate golf activiites. patient was able to return to playing golf over the weekend. Will continue to progress.    Personal Factors and Comorbidities Comorbidity 3+    Comorbidities MI, Rt TKA, cancer    Examination-Activity Limitations Locomotion Level;Stand;Bed Mobility    Examination-Participation Restrictions Cleaning;Shop;Community Activity;Yard Work;Interpersonal Relationship    Rehab Potential Good    PT Frequency 2x / week    PT Duration 4 weeks    PT Treatment/Interventions ADLs/Self Care Home Management;Therapeutic activities;Patient/family education;Balance training;Gait training;Neuromuscular re-education;DME Instruction;Therapeutic  exercise;Vestibular;Stair training;Functional mobility training;Canalith Repostioning    PT Next Visit Plan contiue tandem, turns, VOR.   Continue incorporating golf activities into session.  Assess and treat R BPPV if symptoms return.  Habituation to head movements during functional activities.  Balance reactions on rockerboard.  Medicine ball chops on foam for - Golf.  Bending down with ball between legs with turns/compliant surface.    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   CPAP (continuous positive airway pressure) dependence 01/20/2021   Unsteady gait when walking 01/20/2021   Complaint related to dreams 09/62/8366   Metabolic acidosis with increased anion gap and accumulation of organic acids 08/17/2018   Nausea, vomiting, and diarrhea 08/17/2018   AKI (acute kidney injury) ([place]) 08/17/2018   Overweight (BMI 25.0-29.9) 07/27/2018   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/27/2018   S/P right TKA 07/26/2018   S/P knee replacement 07/26/2018   UARS (upper airway resistance syndrome) 05/30/2018   Hyperlipidemia 05/02/2018   Leg injury, right, initial encounter 01/06/2017  Contusion of right lower leg 01/06/2017   Hypoxemia 12/23/2015   Statin myopathy 05/20/2015   MCI (mild cognitive impairment) 05/20/2015   Edema 05/17/2015   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

## 2021-04-16 ENCOUNTER — Ambulatory Visit: Payer: Medicare Other

## 2021-04-16 DIAGNOSIS — R2689 Other abnormalities of gait and mobility: Secondary | ICD-10-CM

## 2021-04-16 DIAGNOSIS — R42 Dizziness and giddiness: Secondary | ICD-10-CM

## 2021-04-16 DIAGNOSIS — R2681 Unsteadiness on feet: Secondary | ICD-10-CM

## 2021-04-16 DIAGNOSIS — H8111 Benign paroxysmal vertigo, right ear: Secondary | ICD-10-CM | POA: Diagnosis not present

## 2021-04-16 NOTE — Therapy (Signed)
[facility] [address] [place], [place], [address] Phone: [phone]   Fax:  [phone]  Physical Therapy Treatment  Patient Details  Name: Tracey [name] MRN: [id number] Date of Birth: 11/19/1945 Referring Provider (PT): Dr. Asencion Partridge Dohmeier   Encounter Date: 04/16/2021   PT End of Session - 04/16/21 1022     Visit Number 14    Number of Visits 19    Date for PT Re-Evaluation 05/09/21    Authorization Type Medicare A/B; [employer]    Progress Note Due on Visit 20    PT Start Time 1017    PT Stop Time 1059    PT Time Calculation (min) 42 min    Equipment Utilized During Treatment Gait belt    Activity Tolerance Patient tolerated treatment well    Behavior During Therapy WFL for tasks assessed/performed             Past Medical History:  Diagnosis Date   Allergy    Anxiety    takes Ativan daily prn   Arthritis    Breast cancer ([place]) 2014   ER+/PR+/HEr2-,    Bruises easily    Colon polyps    2 polyps by report   Diverticulosis    GERD (gastroesophageal reflux disease)    occasionally takes Nexium    Gout    takes Allopurinol daily and Colchicine daily prn;last attack 53yr ago   Gout    History of bladder infections    many yrs ago   History of bronchitis    last time at least 868yrago   History of stress incontinence    Hyperlipidemia    takes Pravastatin daily   Insomnia    takes Ambien nightly prn   [place]STEMI (non-ST elevated [place] infarction) [place]   OSA (obstructive sleep apnea)    on CPAP   Osteoarthritis    Peripheral edema    takes Furosemide daily prn   PONV (postoperative nausea and vomiting)    Postmenopausal hormone therapy    Radiation 07/27/13-09/07/13   Right Breast x 31 treatments   Rhinitis    uses Flonase prn   Sinus congestion    Status post breast reconstruction 10/15/14   Bilateral implant removal and DIEP performed in [place]   Tachycardia     takes Metoprolol daily   Ventricular tachycardia, non-sustained ([facility]   during sleep study 2009 with normal cardiac workup    Past Surgical History:  Procedure Laterality Date   ABDOMINAL HYSTERECTOMY     with BSO   APPENDECTOMY     AXILLARY SENTINEL NODE BIOPSY Right 05/23/2013   Procedure: AXILLARY SENTINEL NODE BIOPSY;  Surgeon: ToOdis HollingsheadMD;  [facility]: General;  Laterality: Right;  nuc med injection 7:00   BREAST RECONSTRUCTION WITH PLACEMENT OF TISSUE EXPANDER AND FLEX HD (ACELLULAR HYDRATED DERMIS) Bilateral 05/23/2013   Procedure: BILATERAL BREAST RECONSTRUCTION WITH PLACEMENT OF TISSUE EXPANDER AND FLEX HD;  Surgeon: DaCrissie ReeseMD;  [facility] Service: Plastics;  Laterality: Bilateral;   CARDIAC CATHETERIZATION  1999   CATARACT EXTRACTION, BILATERAL Bilateral    CHOLECYSTECTOMY     COSMETIC SURGERY     OTHER SURGICAL HISTORY  09/2017   had cyst removal from spine    PERCUTANEOUS CORONARY STENT INTERVENTION (PCI-S)  08/2017   [place] Bilateral 05/17/2014  Procedure: REMOVAL OF BILATERAL TISSUE EXPANDERS WITH PLACEMENT OF BILATERAL BREAST IMPLANTS FOR RECONSTRUCTION;  Surgeon: [name], MD;  Location: [facility];  Service: Plastics;  Laterality: Bilateral;   TONSILLECTOMY     TOTAL KNEE ARTHROPLASTY Right 07/26/2018   Procedure: TOTAL KNEE ARTHROPLASTY;  Surgeon: Paralee Cancel, MD;  Location: [facility];  Service: Orthopedics;  Laterality: Right;  70 mins   TOTAL MASTECTOMY Bilateral 05/23/2013   Procedure: TOTAL MASTECTOMY;  Surgeon: [name], MD;  Location: [facility];  Service: General;  Laterality: Bilateral;    There were no vitals filed for this visit.   Subjective Assessment - 04/16/21 1019     Subjective Tried adding in the time this morning prior to standing upon wakening, seemed not to help initially but then felt like it did. No other new  changes.    Patient is accompained by: Family member    Pertinent History Breast Cancer 2014, MI, Rt TKA    Limitations Walking    How long can you sit comfortably? unlimited    How long can you stand comfortably? unlimited    How long can you walk comfortably? unlimited    Patient Stated Goals To become more stable with her balance    Currently in Pain? No/denies              [facility] Adult PT Treatment/Exercise - 04/16/21 0001       Ambulation/Gait   Ambulation/Gait Yes    Ambulation/Gait Assistance 6: Modified independent (Device/Increase time)    Assistive device None    Gait Pattern Within Functional Limits    Ambulation Surface Level;Indoor    Gait Comments throughout therapy without activities.      Therapeutic Activites    Therapeutic Activities Other Therapeutic Activities    Other Therapeutic Activities Completed ambulation forwards with horizontal VOR x 1, 2 x 10'. Then completed vertical VOR x 1, 2 x 10'. No dizziness reported, more imbalance noted. close supervision.      Neuro Re-ed    Neuro Re-ed Details  On upward incline on blue mat: completed staggered stance with eyes open x 30 seconds each, then completed staggerd stance eyes open and horizontal/vertical head turns x 10 reps each, alternating foot position. then completed eyes closed 2 x 30 seconds. Most challenge/sway noted with horizontal > vertical. Then standing with feet hip width on incline on blue mat completed alternating toe taps to colored pebbles x 10 reps forward no UE support, then progressed to x 10 reps crossover. Close supervision throughout.             Vestibular Treatment/Exercise - 04/16/21 0001       Vestibular Treatment/Exercise   Vestibular Treatment Provided Gaze    Gaze Exercises X1 Viewing Horizontal;X1 Viewing Vertical      X1 Viewing Horizontal   Foot Position standing feet apart, patterned background    Reps 2    Comments x 30 seconds; x 60 seconds      X1 Viewing  Vertical   Foot Position standing feet apart, patterned background    Reps 2    Comments x 30 seconds; x 60 seconds                Balance Exercises - 04/16/21 0001       Balance Exercises: Standing   Tandem Gait Forward;Retro;Foam/compliant surface;Limitations    Tandem Gait Limitations on blue mat in // bars: completed forward tandem x 3 reps forward with intermittent touch A to bars.  then progressed to forward followed by backwards x 2 reps. Increased challenge with retro tandem.                 PT Short Term Goals - 03/10/21 2035       PT SHORT TERM GOAL #1   Title = LTG               PT Long Term Goals - 04/11/21 1118       PT LONG TERM GOAL #1   Title Patient to score >54 on BERG    Baseline 43 > 52 > 50    Time 4    Period Weeks    Status Revised      PT LONG TERM GOAL #2   Title Patient to demo I in final HEP    Baseline ongoing    Time 4    Period Weeks    Status Revised      PT LONG TERM GOAL #3   Title Patient to score 22 on DGI    Baseline Initial DGI 19 > 21/24    Time 4    Period Weeks    Status Revised      PT LONG TERM GOAL #4   Title Pt will demonstrate improved vestibular function and use of VOR as indicated by 2 line difference on DVA    Baseline 4 line difference (8,4) > 2 line difference (9, 7)    Status Achieved      PT LONG TERM GOAL #5   Title Pt will demonstrate ability to maintain balance when vision removed as indicated by increase in time to 15 seconds on MCTSIB condition 2 and 4    Baseline 4-5 seconds maximum; 9 seconds on 8/26    Time 4    Period Weeks    Status Revised      PT LONG TERM GOAL #6   Title Pt will demonstrate negative positional testing for R and L side and report no dizziness/vertigo with rolling or supine <> sit    Baseline Resolved    Status Achieved                   Plan - 04/16/21 1116     Clinical Impression Statement Continued today's session focused on progression of  VOR x 1 on patterned background and with forward ambulation. Increased balance challenge with horizontal w/ ambulation. Continued balance activities on complaint surface and added in SLS activities. Will continue to progress toward all LTGs.    Personal Factors and Comorbidities Comorbidity 3+    Comorbidities MI, Rt TKA, cancer    Examination-Activity Limitations Locomotion Level;Stand;Bed Mobility    Examination-Participation Restrictions Cleaning;Shop;Community Activity;Yard Work;Interpersonal Relationship    Rehab Potential Good    PT Frequency 2x / week    PT Duration 4 weeks    PT Treatment/Interventions ADLs/Self Care Home Management;Therapeutic activities;Patient/family education;Balance training;Gait training;Neuromuscular re-education;DME Instruction;Therapeutic exercise;Vestibular;Stair training;Functional mobility training;Canalith Repostioning    PT Next Visit Plan contiue tandem, turns, VOR.   Continue incorporating golf activities into session.  Assess and treat R BPPV if symptoms return.  Habituation to head movements during functional activities.  Balance reactions on rockerboard.  Medicine ball chops on foam for - Golf.  Bending down with ball between legs with turns/compliant surface.    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   CPAP (continuous positive airway pressure) dependence 01/20/2021   Unsteady gait when walking 01/20/2021    Complaint related to dreams 95/32/0233   Metabolic acidosis with increased anion gap and accumulation of organic acids 08/17/2018   Nausea, vomiting, and diarrhea 08/17/2018   AKI (acute kidney injury) ([place]) 08/17/2018   Overweight (BMI 25.0-29.9) 07/27/2018   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/27/2018   S/P right TKA 07/26/2018   S/P knee replacement 07/26/2018   UARS (upper airway resistance syndrome) 05/30/2018   Hyperlipidemia 05/02/2018   Leg injury, right, initial encounter 01/06/2017   Contusion of right lower leg 01/06/2017   Hypoxemia 12/23/2015   Statin myopathy 05/20/2015   MCI (mild cognitive impairment) 05/20/2015   Edema 05/17/2015   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

## 2021-04-22 ENCOUNTER — Ambulatory Visit: Payer: Medicare Other | Admitting: Physical Therapy

## 2021-04-24 ENCOUNTER — Other Ambulatory Visit: Payer: Self-pay

## 2021-04-24 ENCOUNTER — Ambulatory Visit: Payer: Medicare Other | Attending: Neurology

## 2021-04-24 VITALS — BP 133/72 | HR 69

## 2021-04-24 DIAGNOSIS — R42 Dizziness and giddiness: Secondary | ICD-10-CM | POA: Diagnosis not present

## 2021-04-24 DIAGNOSIS — H8111 Benign paroxysmal vertigo, right ear: Secondary | ICD-10-CM | POA: Diagnosis not present

## 2021-04-24 DIAGNOSIS — R2689 Other abnormalities of gait and mobility: Secondary | ICD-10-CM | POA: Diagnosis not present

## 2021-04-24 DIAGNOSIS — R2681 Unsteadiness on feet: Secondary | ICD-10-CM | POA: Insufficient documentation

## 2021-04-24 NOTE — Therapy (Signed)
[facility] [address] [place], [place], [address] Phone: [phone]   Fax:  [phone]  Physical Therapy Treatment  Patient Details  Name: Tracey [name] MRN: [id number] Date of Birth: 1946/04/24 Referring Provider (PT): Dr. Asencion Partridge Dohmeier   Encounter Date: 04/24/2021   PT End of Session - 04/24/21 1400     Visit Number 15    Number of Visits 19    Date for PT Re-Evaluation 05/09/21    Authorization Type Medicare A/B; [employer]    Progress Note Due on Visit 20    PT Start Time 1401    PT Stop Time 1444    PT Time Calculation (min) 43 min    Equipment Utilized During Treatment Gait belt    Activity Tolerance Patient tolerated treatment well    Behavior During Therapy WFL for tasks assessed/performed             Past Medical History:  Diagnosis Date   Allergy    Anxiety    takes Ativan daily prn   Arthritis    Breast cancer ([place]) 2014   ER+/PR+/HEr2-,    Bruises easily    Colon polyps    2 polyps by report   Diverticulosis    GERD (gastroesophageal reflux disease)    occasionally takes Nexium    Gout    takes Allopurinol daily and Colchicine daily prn;last attack 2yr ago   Gout    History of bladder infections    many yrs ago   History of bronchitis    last time at least 856yrago   History of stress incontinence    Hyperlipidemia    takes Pravastatin daily   Insomnia    takes Ambien nightly prn   NSTEMI (non-ST elevated myocardial infarction) ([facility]/[place]   OSA (obstructive sleep apnea)    on CPAP   Osteoarthritis    Peripheral edema    takes Furosemide daily prn   PONV (postoperative nausea and vomiting)    Postmenopausal hormone therapy    Radiation 07/27/13-09/07/13   Right Breast x 31 treatments   Rhinitis    uses Flonase prn   Sinus congestion    Status post breast reconstruction 10/15/14   Bilateral implant removal and DIEP performed in [place]   Tachycardia     takes Metoprolol daily   Ventricular tachycardia, non-sustained ([facility]   during sleep study 2009 with normal cardiac workup    Past Surgical History:  Procedure Laterality Date   ABDOMINAL HYSTERECTOMY     with BSO   APPENDECTOMY     AXILLARY SENTINEL NODE BIOPSY Right 05/23/2013   Procedure: AXILLARY SENTINEL NODE BIOPSY;  Surgeon: ToOdis HollingsheadMD;  [facility] Service: General;  Laterality: Right;  nuc med injection 7:00   BREAST RECONSTRUCTION WITH PLACEMENT OF TISSUE EXPANDER AND FLEX HD (ACELLULAR HYDRATED DERMIS) Bilateral 05/23/2013   Procedure: BILATERAL BREAST RECONSTRUCTION WITH PLACEMENT OF TISSUE EXPANDER AND FLEX HD;  Surgeon: DaCrissie ReeseMD;  [facility] Service: Plastics;  Laterality: Bilateral;   CARDIAC CATHETERIZATION  1999   CATARACT EXTRACTION, BILATERAL Bilateral    CHOLECYSTECTOMY     COSMETIC SURGERY     OTHER SURGICAL HISTORY  09/2017   had cyst removal from spine    PERCUTANEOUS CORONARY STENT INTERVENTION (PCI-S)  08/2017   [place] BILATERAL BREAST IMPLANTS Bilateral 05/17/2014  Procedure: REMOVAL OF BILATERAL TISSUE EXPANDERS WITH PLACEMENT OF BILATERAL BREAST IMPLANTS FOR RECONSTRUCTION;  Surgeon: [name], MD;  Location: [facility];  Service: Plastics;  Laterality: Bilateral;   TONSILLECTOMY     TOTAL KNEE ARTHROPLASTY Right 07/26/2018   Procedure: TOTAL KNEE ARTHROPLASTY;  Surgeon: Paralee Cancel, MD;  [facility];  Service: Orthopedics;  Laterality: Right;  70 mins   TOTAL MASTECTOMY Bilateral 05/23/2013   Procedure: TOTAL MASTECTOMY;  Surgeon: [name], MD;  Location: [facility];  Service: General;  Laterality: Bilateral;    Vitals:   04/24/21 1407  BP: 133/72  Pulse: 69     Subjective Assessment - 04/24/21 1401     Subjective Played golf this morning. Reports that had a rough few days earlier this week, felt like she has been "off". Reports wooziness,  denies return of spinning sensation.    Patient is accompained by: Family member    Pertinent History Breast Cancer 2014, MI, Rt TKA    Limitations Walking    How long can you sit comfortably? unlimited    How long can you stand comfortably? unlimited    How long can you walk comfortably? unlimited    Patient Stated Goals To become more stable with her balance    Currently in Pain? No/denies               Vestibular Assessment - 04/24/21 0001       Positional Testing   Dix-Hallpike Dix-Hallpike Right;Dix-Hallpike Left      Dix-Hallpike Right   Dix-Hallpike Right Duration 15 seconds    Dix-Hallpike Right Symptoms Upbeat, right rotatory nystagmus      Dix-Hallpike Left   Dix-Hallpike Left Duration 0    Dix-Hallpike Left Symptoms No nystagmus      Horizontal Canal Right   Horizontal Canal Right Duration 10 seconds    Horizontal Canal Right Symptoms Other (comment)   R Rotary Upbeat     Horizontal Canal Left   Horizontal Canal Left Duration 0    Horizontal Canal Left Symptoms Normal              [facility] Adult PT Treatment/Exercise - 04/24/21 0001       Ambulation/Gait   Ambulation/Gait Yes    Ambulation/Gait Assistance 5: Supervision    Ambulation/Gait Assistance Details Upon ambulating into session, patient stumbling with requiring light touch to wall for stabilization twice. After treatment of BPPV, completed gait around therapy session x 300 ft with improved stability noted and no stumbling/veering. Patient reports feeling comfortable ambulating out to car alone, and reports reduced "fogginess' in head    Ambulation Distance (Feet) 300 Feet    Assistive device None    Gait Pattern Within Functional Limits    Ambulation Surface Level;Indoor             Vestibular Treatment/Exercise - 04/24/21 0001       Vestibular Treatment/Exercise   Vestibular Treatment Provided Canalith Repositioning    Canalith Repositioning Epley Manuever Right       EPLEY MANUEVER  RIGHT   Number of Reps  2    Overall Response Symptoms Resolved    Response Details  Mild Nausea requiring rest break between treatments for resolution. Nystagmus/symptoms resolved                    PT Education - 04/24/21 1447     Education Details Return of R BPPV: Trial Sleeping on L side or on back with head elevated  Person(s) Educated Patient    Methods Explanation    Comprehension Verbalized understanding              PT Short Term Goals - 03/10/21 2035       PT SHORT TERM GOAL #1   Title = LTG               PT Long Term Goals - 04/11/21 1118       PT LONG TERM GOAL #1   Title Patient to score >54 on BERG    Baseline 43 > 52 > 50    Time 4    Period Weeks    Status Revised      PT LONG TERM GOAL #2   Title Patient to demo I in final HEP    Baseline ongoing    Time 4    Period Weeks    Status Revised      PT LONG TERM GOAL #3   Title Patient to score 22 on DGI    Baseline Initial DGI 19 > 21/24    Time 4    Period Weeks    Status Revised      PT LONG TERM GOAL #4   Title Pt will demonstrate improved vestibular function and use of VOR as indicated by 2 line difference on DVA    Baseline 4 line difference (8,4) > 2 line difference (9, 7)    Status Achieved      PT LONG TERM GOAL #5   Title Pt will demonstrate ability to maintain balance when vision removed as indicated by increase in time to 15 seconds on MCTSIB condition 2 and 4    Baseline 4-5 seconds maximum; 9 seconds on 8/26    Time 4    Period Weeks    Status Revised      PT LONG TERM GOAL #6   Title Pt will demonstrate negative positional testing for R and L side and report no dizziness/vertigo with rolling or supine <> sit    Baseline Resolved    Status Achieved                   Plan - 04/24/21 1450     Clinical Impression Statement Due to patient symptoms reported and unsteadiness with ambulation, PT completed reassesment of BPPV. Patient demonstrating R  Upbeating Nystagmus of Short Duration indicating return of R Posterior Canal Canalithiasis. Completed CRM x 2 reps wiht resolution noted.  Completed gait after CRM with improved stability and reduced veering noted. Will continue to assess and progress towrd all LTGs.    Personal Factors and Comorbidities Comorbidity 3+    Comorbidities MI, Rt TKA, cancer    Examination-Activity Limitations Locomotion Level;Stand;Bed Mobility    Examination-Participation Restrictions Cleaning;Shop;Community Activity;Yard Work;Interpersonal Relationship    Rehab Potential Good    PT Frequency 2x / week    PT Duration 4 weeks    PT Treatment/Interventions ADLs/Self Care Home Management;Therapeutic activities;Patient/family education;Balance training;Gait training;Neuromuscular re-education;DME Instruction;Therapeutic exercise;Vestibular;Stair training;Functional mobility training;Canalith Repostioning    PT Next Visit Plan Audra - R BPPV Returned, Cleared with CRM. Please reassess and treat as indicated. If resolved continue Habituation to head movements during functional activities.  Balance reactions on rockerboard.  Medicine ball chops on foam for - Golf.  Bending down with ball between legs with turns/compliant surface.    Consulted and Agree with Plan of Care Patient             Patient will benefit  from skilled therapeutic intervention in order to improve the following deficits and impairments:  Decreased mobility, Decreased balance, Dizziness, Difficulty walking, Impaired vision/preception, Impaired sensation, Decreased activity tolerance  Visit Diagnosis: Unsteadiness on feet  Other abnormalities of gait and mobility  Dizziness and giddiness  BPPV (benign paroxysmal positional vertigo), right     Problem List Patient Active Problem List   Diagnosis Date Noted   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   CPAP (continuous positive airway pressure) dependence  01/20/2021   Unsteady gait when walking 01/20/2021   Complaint related to dreams 99/01/8933   Metabolic acidosis with increased anion gap and accumulation of organic acids 08/17/2018   Nausea, vomiting, and diarrhea 08/17/2018   AKI (acute kidney injury) ([facility]) 08/17/2018   Overweight (BMI 25.0-29.9) 07/27/2018   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/27/2018   S/P right TKA 07/26/2018   S/P knee replacement 07/26/2018   UARS (upper airway resistance syndrome) 05/30/2018   Hyperlipidemia 05/02/2018   Leg injury, right, initial encounter 01/06/2017   Contusion of right lower leg 01/06/2017   Hypoxemia 12/23/2015   Statin myopathy 05/20/2015   MCI (mild cognitive impairment) 05/20/2015   Edema 05/17/2015   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 [place], [place], [address] Phone: [phone]   Fax:  [phone]  Name: Tracey [name] MRN: [id number] Date of Birth: Aug 12, 1946

## 2021-04-25 ENCOUNTER — Ambulatory Visit: Payer: Medicare Other | Admitting: Physical Therapy

## 2021-04-25 DIAGNOSIS — R2689 Other abnormalities of gait and mobility: Secondary | ICD-10-CM | POA: Diagnosis not present

## 2021-04-25 DIAGNOSIS — H8111 Benign paroxysmal vertigo, right ear: Secondary | ICD-10-CM | POA: Diagnosis not present

## 2021-04-25 DIAGNOSIS — R42 Dizziness and giddiness: Secondary | ICD-10-CM

## 2021-04-25 DIAGNOSIS — R2681 Unsteadiness on feet: Secondary | ICD-10-CM | POA: Diagnosis not present

## 2021-04-25 NOTE — Patient Instructions (Signed)
Gaze Stabilization: Standing Feet Together    Feet together, keeping eyes on target on wall 3 feet away, tilt head down 15-30 and move head side to side for 60 seconds. Repeat while moving head up and down for 60 seconds. Steady pace, try to keep the body as still as possible Do 2-3 sessions per day.  2.         Bending to Pick up              Stand with feet apart and an object resting on a low chair/ottoman 2 feet off the ground.  Face the object - look down at object, reach down and pick up, standing back up.  Look down and put it back down.  Repeat 5 times.     Turn so the object is to the RIGHT.  Using your LEFT hand, reach across and pick up the object.  Look back over to the right and put it back down.  Repeat 5 times with object to the right.     Turn around so the object is to the LEFT.  Repeat using your RIGHT hand, 5 times.     Feet Together (Compliant Surface) - Eyes Closed    Stand on compliant surface: stand with feet together and arms by your side. Close eyes and visualize upright position. Hold 30 seconds. NO HEAD MOTION.  Repeat 2 times per session. Do 2 sessions per day.    Feet Together, Head Motion - Eyes Closed    With eyes closed and feet together, move head slowly, up and down 10 times. Then move head slowly right and left 10 times.  Repeat 2 times per session. Do 2 sessions per day.   Tandem Stance    Right foot in front of left, heel touching toe both feet "straight ahead". Stand on Foot Triangle of Support with both feet. Balance in this position 30 seconds. Do with left foot in front of right. Completed 2 reps each.   Copyright  VHI. All rights reserved.

## 2021-04-25 NOTE — Therapy (Signed)
[facility] [address] [place], [place], [address] Phone: [phone]   Fax:  [phone]  Physical Therapy Treatment  Patient Details  Name: Tracey [name] MRN: [id number] Date of Birth: 1946-06-26 Referring Provider (PT): Dr. Asencion Partridge Dohmeier   Encounter Date: 04/25/2021   PT End of Session - 04/25/21 1153     Visit Number 16    Number of Visits 19    Date for PT Re-Evaluation 05/09/21    Authorization Type Medicare A/B; [employer]    Progress Note Due on Visit 20    PT Start Time 1150    PT Stop Time 1230    PT Time Calculation (min) 40 min    Activity Tolerance Patient tolerated treatment well    Behavior During Therapy [facility] for tasks assessed/performed             Past Medical History:  Diagnosis Date   Allergy    Anxiety    takes Ativan daily prn   Arthritis    Breast cancer ([place]) 2014   ER+/PR+/HEr2-,    Bruises easily    Colon polyps    2 polyps by report   Diverticulosis    GERD (gastroesophageal reflux disease)    occasionally takes Nexium    Gout    takes Allopurinol daily and Colchicine daily prn;last attack 51yr ago   Gout    History of bladder infections    many yrs ago   History of bronchitis    last time at least 841yrago   History of stress incontinence    Hyperlipidemia    takes Pravastatin daily   Insomnia    takes Ambien nightly prn   NSTEMI (non-ST elevated [place] infarction) [place]   OSA (obstructive sleep apnea)    on CPAP   Osteoarthritis    Peripheral edema    takes Furosemide daily prn   PONV (postoperative nausea and vomiting)    Postmenopausal hormone therapy    Radiation 07/27/13-09/07/13   Right Breast x 31 treatments   Rhinitis    uses Flonase prn   Sinus congestion    Status post breast reconstruction 10/15/14   Bilateral implant removal and DIEP performed in [place]   Tachycardia    takes Metoprolol daily   Ventricular [place],  [place]sustained [place]   during [place] study 2009 with normal cardiac workup    Past Surgical History:  Procedure Laterality Date   ABDOMINAL HYSTERECTOMY     with BSO   APPENDECTOMY     AXILLARY SENTINEL NODE BIOPSY Right 05/23/2013   Procedure: AXILLARY SENTINEL NODE BIOPSY;  Surgeon: ToOdis HollingsheadMD;  [facility] Service: General;  Laterality: Right;  nuc med injection 7:00   BREAST RECONSTRUCTION WITH PLACEMENT OF TISSUE EXPANDER AND FLEX HD (ACELLULAR HYDRATED DERMIS) Bilateral 05/23/2013   Procedure: BILATERAL BREAST RECONSTRUCTION WITH PLACEMENT OF TISSUE EXPANDER AND FLEX HD;  Surgeon: DaCrissie ReeseMD;  [facility] Service: Pl[facility];  Laterality: Bilateral;   CARDIAC CATHETERIZATION  1999   CATARACT EXTRACTION, BILATERAL Bilateral    CHOLECYSTECTOMY     COSMETIC SURGERY     OTHER SURGICAL HISTORY  09/2017   had cyst removal from spine    PERCUTANEOUS CORONARY STENT INTERVENTION (PCI-S)  08/2017   [place] Bilateral 05/17/2014   Procedure: REMOVAL OF BILATERAL TISSUE EXPANDERS WITH PLACEMENT OF  BILATERAL BREAST IMPLANTS FOR RECONSTRUCTION;  Surgeon: [name], MD;  Location: [facility];  Service: Plastics;  Laterality: Bilateral;   TONSILLECTOMY     TOTAL KNEE ARTHROPLASTY Right 07/26/2018   Procedure: TOTAL KNEE ARTHROPLASTY;  Surgeon: Paralee Cancel, MD;  [facility];  Service: Orthopedics;  Laterality: Right;  70 mins   TOTAL MASTECTOMY Bilateral 05/23/2013   Procedure: TOTAL MASTECTOMY;  Surgeon: [name], MD;  Location: [facility];  Service: General;  Laterality: Bilateral;    There were no vitals filed for this visit.   Subjective Assessment - 04/25/21 1155     Subjective Feeling better today, got a lot of sleep last night.  Has been playing golf more often.  Would like to check the dizziness again.    Patient is accompained by: Family member    Pertinent  History Breast Cancer 2014, MI, Rt TKA    Limitations Walking    How long can you sit comfortably? unlimited    How long can you stand comfortably? unlimited    How long can you walk comfortably? unlimited    Patient Stated Goals To become more stable with her balance    Currently in Pain? No/denies                     Vestibular Assessment - 04/25/21 1157       Positional Testing   Dix-Hallpike Dix-Hallpike Right;Dix-Hallpike Left    Horizontal Canal Testing Horizontal Canal Right;Horizontal Canal Left      Dix-Hallpike Right   Dix-Hallpike Right Duration 0    Dix-Hallpike Right Symptoms No nystagmus      Dix-Hallpike Left   Dix-Hallpike Left Duration 0    Dix-Hallpike Left Symptoms No nystagmus      Horizontal Canal Right   Horizontal Canal Right Duration 0    Horizontal Canal Right Symptoms Normal      Horizontal Canal Left   Horizontal Canal Left Duration 0    Horizontal Canal Left Symptoms Normal                       Vestibular Treatment/Exercise - 04/25/21 1245       Vestibular Treatment/Exercise   Vestibular Treatment Provided Gaze    Gaze Exercises X1 Viewing Horizontal      X1 Viewing Horizontal   Foot Position feet apart, busy background    Reps 1    Comments 60 seconds, no significant symptoms; will continue to progress next session                Balance Exercises - 04/25/21 1207       Balance Exercises: Standing   Standing Eyes Opened Wide (BOA);Head turns;Foam/compliant surface;Other reps (comment);Limitations   10 reps head turns   Standing Eyes Opened Limitations 10 reps head turns on balance beam; if pt closes eyes while turning head, no dizziness.  If eyes stay open pt experiences dizziness and LOB    Balance Beam Blue foam balance beam: tandem gait eyes down x 4 laps and then eyes forward x 4 laps.  Not able to do head turns; caused significant dizziness and severe LOB.  Tandem gait forwards with hold x 5 seconds  x 4 laps.  Changed to side stepping L and R with supervision x 2 laps - no significant LOB.                PT Education - 04/25/21 1245     Education Details  Resolution of BPPV.    Person(s) Educated Patient    Methods Explanation    Comprehension Verbalized understanding              PT Short Term Goals - 03/10/21 2035       PT SHORT TERM GOAL #1   Title = LTG               PT Long Term Goals - 04/11/21 1118       PT LONG TERM GOAL #1   Title Patient to score >54 on BERG    Baseline 43 > 52 > 50    Time 4    Period Weeks    Status Revised      PT LONG TERM GOAL #2   Title Patient to demo I in final HEP    Baseline ongoing    Time 4    Period Weeks    Status Revised      PT LONG TERM GOAL #3   Title Patient to score 22 on DGI    Baseline Initial DGI 19 > 21/24    Time 4    Period Weeks    Status Revised      PT LONG TERM GOAL #4   Title Pt will demonstrate improved vestibular function and use of VOR as indicated by 2 line difference on DVA    Baseline 4 line difference (8,4) > 2 line difference (9, 7)    Status Achieved      PT LONG TERM GOAL #5   Title Pt will demonstrate ability to maintain balance when vision removed as indicated by increase in time to 15 seconds on MCTSIB condition 2 and 4    Baseline 4-5 seconds maximum; 9 seconds on 8/26    Time 4    Period Weeks    Status Revised      PT LONG TERM GOAL #6   Title Pt will demonstrate negative positional testing for R and L side and report no dizziness/vertigo with rolling or supine <> sit    Baseline Resolved    Status Achieved               Plan - 04/25/21 1238     Clinical Impression Statement Performed re-assessment for R BPPV; no evidence of nystagmus and no vertigo reported in any testing position.  Continued to focus on dynamic balance, balance reactions and postural control on compliant surfaces.  Pt continued to report significant disequilibrium with head turns if  eyes open, no issues if eyes closed or pt had gaze fixed on stable object.  Pt does appear to have visual motion sensitivity and difficulty with optokinetic nystagmus but did not report any symptoms when performing VOR with busy background.  Will continue to address and progress towards LTG.    Personal Factors and Comorbidities Comorbidity 3+    Comorbidities MI, Rt TKA, cancer    Examination-Activity Limitations Locomotion Level;Stand;Bed Mobility    Examination-Participation Restrictions Cleaning;Shop;Community Activity;Yard Work;Interpersonal Relationship    Rehab Potential Good    PT Frequency 2x / week    PT Duration 4 weeks    PT Treatment/Interventions ADLs/Self Care Home Management;Therapeutic activities;Patient/family education;Balance training;Gait training;Neuromuscular re-education;DME Instruction;Therapeutic exercise;Vestibular;Stair training;Functional mobility training;Canalith Repostioning    PT Next Visit Plan Progress VOR to busy background or x2 viewing?  Balance and walking with head turns. Balance reactions on rockerboard.  Medicine ball chops on foam for - Golf.  Bending down with ball between legs with turns/compliant  surface.    Consulted and Agree with Plan of Care Patient             Patient will benefit from skilled therapeutic intervention in order to improve the following deficits and impairments:  Decreased mobility, Decreased balance, Dizziness, Difficulty walking, Impaired vision/preception, Impaired sensation, Decreased activity tolerance  Visit Diagnosis: Unsteadiness on feet  Other abnormalities of gait and mobility  Dizziness and giddiness  BPPV (benign paroxysmal positional vertigo), right     Problem List Patient Active Problem List   Diagnosis Date Noted   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   CPAP (continuous positive airway pressure) dependence 01/20/2021   Unsteady gait when walking 01/20/2021    Complaint related to dreams 17/40/8144   Metabolic acidosis with increased anion gap and accumulation of organic acids 08/17/2018   Nausea, vomiting, and diarrhea 08/17/2018   AKI (acute kidney injury) ([employer]) 08/17/2018   Overweight (BMI 25.0-29.9) 07/27/2018   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/27/2018   S/P right TKA 07/26/2018   S/P knee replacement 07/26/2018   UARS (upper airway resistance syndrome) 05/30/2018   Hyperlipidemia 05/02/2018   Leg injury, right, initial encounter 01/06/2017   Contusion of right lower leg 01/06/2017   Hypoxemia 12/23/2015   Statin myopathy 05/20/2015   MCI (mild cognitive impairment) 05/20/2015   Edema 05/17/2015   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 [place], [place], [address] Phone: [phone]   Fax:  [phone]  Name: Tracey [name] MRN: [id number] Date of Birth: 1946/02/04

## 2021-04-28 ENCOUNTER — Ambulatory Visit: Payer: Medicare Other

## 2021-04-28 ENCOUNTER — Other Ambulatory Visit: Payer: Self-pay

## 2021-04-28 DIAGNOSIS — H8111 Benign paroxysmal vertigo, right ear: Secondary | ICD-10-CM | POA: Diagnosis not present

## 2021-04-28 DIAGNOSIS — R2681 Unsteadiness on feet: Secondary | ICD-10-CM | POA: Diagnosis not present

## 2021-04-28 DIAGNOSIS — R42 Dizziness and giddiness: Secondary | ICD-10-CM | POA: Diagnosis not present

## 2021-04-28 DIAGNOSIS — R2689 Other abnormalities of gait and mobility: Secondary | ICD-10-CM

## 2021-04-28 NOTE — Therapy (Signed)
[facility] [address] [place], [place], [address] Phone: [phone]   Fax:  [phone]  Physical Therapy Treatment  Patient Details  Name: Tracey [name] MRN: [id number] Date of Birth: Nov 06, 1945 Referring Provider (PT): Dr. Asencion Partridge Dohmeier   Encounter Date: 04/28/2021   PT End of Session - 04/28/21 1320     Visit Number 17    Number of Visits 19    Date for PT Re-Evaluation 05/09/21    Authorization Type Medicare A/B; [employer]    Progress Note Due on Visit 20    PT Start Time 1318    PT Stop Time 1358    PT Time Calculation (min) 40 min    Activity Tolerance Patient tolerated treatment well    Behavior During Therapy [facility] for tasks assessed/performed             Past Medical History:  Diagnosis Date   Allergy    Anxiety    takes Ativan daily prn   Arthritis    Breast cancer ([place]) 2014   ER+/PR+/HEr2-,    Bruises easily    Colon polyps    2 polyps by report   Diverticulosis    GERD (gastroesophageal reflux disease)    occasionally takes Nexium    Gout    takes Allopurinol daily and Colchicine daily prn;last attack 77yr ago   Gout    History of bladder infections    many yrs ago   History of bronchitis    last time at least 868yrago   History of stress incontinence    Hyperlipidemia    takes Pravastatin daily   Insomnia    takes Ambien nightly prn   NSTEMI (non-ST elevated [place] infarction) [place]   OSA (obstructive sleep apnea)    on CPAP   Osteoarthritis    Peripheral edema    takes Furosemide daily prn   PONV (postoperative nausea and vomiting)    Postmenopausal hormone therapy    Radiation 07/27/13-09/07/13   Right Breast x 31 treatments   Rhinitis    uses Flonase prn   Sinus congestion    Status post breast reconstruction 10/15/14   Bilateral implant removal and DIEP performed in [place]   Tachycardia    takes Metoprolol daily   [place]entricular tachy[place],  [place]sustained ([facility] [place]   during sleep study 2009 with normal cardiac workup    Past Surgical History:  Procedure Laterality Date   ABDOMINAL HYSTERECTOMY     with BSO   APPENDECTOMY     AXILLARY SENTINEL NODE BIOPSY Right 05/23/2013   Procedure: AXILLARY SENTINEL NODE BIOPSY;  Surgeon: ToOdis HollingsheadMD;  [facility] Service: General;  Laterality: Right;  nuc med injection 7:00   BREAST RECONSTRUCTION WITH PLACEMENT OF TISSUE EXPANDER AND FLEX HD (ACELLULAR HYDRATED DERMIS) Bilateral 05/23/2013   Procedure: BILATERAL BREAST RECONSTRUCTION WITH PLACEMENT OF TISSUE EXPANDER AND FLEX HD;  Surgeon: DaCrissie ReeseMD;  [facility] Service: Plastics;  Laterality: Bilateral;   CARDIAC CATHETERIZATION  1999   CATARACT EXTRACTION, BILATERAL Bilateral    CHOLECYSTECTOMY     COSMETIC SURGERY     OTHER SURGICAL HISTORY  09/2017   had cyst removal from spine    PERCUTANEOUS CORONARY STENT INTERVENTION (PCI-S)  08/2017   [place] BRE[place] IMPL[place] Bilateral 05/17/2014   Procedure: REMOVAL OF BILATERAL TISSUE EXPANDERS WITH PLACEMENT OF  BILATERAL BREAST IMPLANTS FOR RECONSTRUCTION;  Surgeon: [name], MD;  Location: [facility];  Service: Plastics;  Laterality: Bilateral;   TONSILLECTOMY     TOTAL KNEE ARTHROPLASTY Right 07/26/2018   Procedure: TOTAL KNEE ARTHROPLASTY;  Surgeon: Paralee Cancel, MD;  [facility];  Service: Orthopedics;  Laterality: Right;  70 mins   TOTAL MASTECTOMY Bilateral 05/23/2013   Procedure: TOTAL MASTECTOMY;  Surgeon: [name], MD;  Location: [facility];  Service: General;  Laterality: Bilateral;    There were no vitals filed for this visit.   Subjective Assessment - 04/28/21 1320     Subjective Reports now that her husband has vertigo. No new changes/complaints. Reports that she felt mild spinning sensation this morning. Would like to be checked.    Patient is accompained by:  Family member    Pertinent History Breast Cancer 2014, MI, Rt TKA    Limitations Walking    How long can you sit comfortably? unlimited    How long can you stand comfortably? unlimited    How long can you walk comfortably? unlimited    Patient Stated Goals To become more stable with her balance    Currently in Pain? No/denies               Vestibular Assessment - 04/28/21 0001       Positional Testing   Dix-Hallpike Dix-Hallpike Right;Dix-Hallpike Left    Horizontal Canal Testing Horizontal Canal Right;Horizontal Canal Left      Dix-Hallpike Right   Dix-Hallpike Right Duration 15    Dix-Hallpike Right Symptoms Upbeat, right rotatory nystagmus      Dix-Hallpike Left   Dix-Hallpike Left Duration 0    Dix-Hallpike Left Symptoms No nystagmus      Horizontal Canal Right   Horizontal Canal Right Duration 0    Horizontal Canal Right Symptoms Normal      Horizontal Canal Left   Horizontal Canal Left Duration 0    Horizontal Canal Left Symptoms Normal               Vestibular Treatment/Exercise - 04/28/21 0001       Vestibular Treatment/Exercise   Vestibular Treatment Provided Canalith Repositioning;Gaze    Canalith Repositioning Epley Manuever Right    Gaze Exercises X1 Viewing Horizontal;X1 Viewing Vertical;X2 Viewing Horizontal       EPLEY MANUEVER RIGHT   Number of Reps  2    Overall Response Symptoms Resolved    Response Details  moderate dizziness, resolution noted after second rep.      X1 Viewing Horizontal   Foot Position feet apart, busy background > feet together, busy background    Reps 2    Comments x 60 secs, no dizziness with feet apart. mild postural sway w/ narrow BOS      X1 Viewing Vertical   Foot Position feet apart, busy background > feet together, busy background    Reps 2    Comments x 60 secs, no dizziness. mild postural sway w/ narrow BOS      X2 Viewing Horizontal   Foot Position seated    Reps 2     Comments x 30 seconds; mild  blurriness reported with completion to R. PT providing cues to decrease head turn for reduced blurriness.                      PT Short Term Goals - 03/10/21 2035       PT SHORT TERM GOAL #1  Title = LTG               PT Long Term Goals - 04/11/21 1118       PT LONG TERM GOAL #1   Title Patient to score >54 on BERG    Baseline 43 > 52 > 50    Time 4    Period Weeks    Status Revised      PT LONG TERM GOAL #2   Title Patient to demo I in final HEP    Baseline ongoing    Time 4    Period Weeks    Status Revised      PT LONG TERM GOAL #3   Title Patient to score 22 on DGI    Baseline Initial DGI 19 > 21/24    Time 4    Period Weeks    Status Revised      PT LONG TERM GOAL #4   Title Pt will demonstrate improved vestibular function and use of VOR as indicated by 2 line difference on DVA    Baseline 4 line difference (8,4) > 2 line difference (9, 7)    Status Achieved      PT LONG TERM GOAL #5   Title Pt will demonstrate ability to maintain balance when vision removed as indicated by increase in time to 15 seconds on MCTSIB condition 2 and 4    Baseline 4-5 seconds maximum; 9 seconds on 8/26    Time 4    Period Weeks    Status Revised      PT LONG TERM GOAL #6   Title Pt will demonstrate negative positional testing for R and L side and report no dizziness/vertigo with rolling or supine <> sit    Baseline Resolved    Status Achieved                   Plan - 04/28/21 1323     Clinical Impression Statement Due to patient reports, reassessed BPPV with patient demonstrating R Upbeating Rotary Nystagmus of short duration indicating reoccurence of R Posterior Canal Canalithiasis. Completed CRM x 2 reps with reoslution noted after second rep. Continued progression of VOR with narrow BOS on patterned background and initiated VOR x 2. Patient tolerating well. Will continue to progress toward all LTGs.    Personal Factors and Comorbidities  Comorbidity 3+    Comorbidities MI, Rt TKA, cancer    Examination-Activity Limitations Locomotion Level;Stand;Bed Mobility    Examination-Participation Restrictions Cleaning;Shop;Community Activity;Yard Work;Interpersonal Relationship    Rehab Potential Good    PT Frequency 2x / week    PT Duration 4 weeks    PT Treatment/Interventions ADLs/Self Care Home Management;Therapeutic activities;Patient/family education;Balance training;Gait training;Neuromuscular re-education;DME Instruction;Therapeutic exercise;Vestibular;Stair training;Functional mobility training;Canalith Repostioning    PT Next Visit Plan Recheckfor BPPV. Continue to Progress VOR to busy background or x2 viewing.   Balance and walking with head turns. Balance reactions on rockerboard.  Medicine ball chops on foam for - Golf.  Bending down with ball between legs with turns/compliant surface.    Consulted and Agree with Plan of Care Patient             Patient will benefit from skilled therapeutic intervention in order to improve the following deficits and impairments:  Decreased mobility, Decreased balance, Dizziness, Difficulty walking, Impaired vision/preception, Impaired sensation, Decreased activity tolerance  Visit Diagnosis: Unsteadiness on feet  Other abnormalities of gait and mobility  Dizziness and giddiness     Problem List  Patient Active Problem List   Diagnosis Date Noted   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   CPAP (continuous positive airway pressure) dependence 01/20/2021   Unsteady gait when walking 01/20/2021   Complaint related to dreams 56/81/2751   Metabolic acidosis with increased anion gap and accumulation of organic acids 08/17/2018   Nausea, vomiting, and diarrhea 08/17/2018   AKI (acute kidney injury) ([place]) 08/17/2018   Overweight (BMI 25.0-29.9) 07/27/2018   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/27/2018   S/P right TKA 07/26/2018   S/P knee  replacement 07/26/2018   UARS (upper airway resistance syndrome) 05/30/2018   Hyperlipidemia 05/02/2018   Leg injury, right, initial encounter 01/06/2017   Contusion of right lower leg 01/06/2017   Hypoxemia 12/23/2015   Statin myopathy 05/20/2015   MCI (mild cognitive impairment) 05/20/2015   Edema 05/17/2015   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 [place], [place], [address] Phone: [phone]   Fax:  [phone]  Name: Tracey [name] MRN: [id number] Date of Birth: [birth year]

## 2021-05-01 ENCOUNTER — Other Ambulatory Visit: Payer: Self-pay

## 2021-05-01 ENCOUNTER — Ambulatory Visit: Payer: Medicare Other

## 2021-05-01 VITALS — BP 128/70

## 2021-05-01 DIAGNOSIS — R42 Dizziness and giddiness: Secondary | ICD-10-CM

## 2021-05-01 DIAGNOSIS — R2681 Unsteadiness on feet: Secondary | ICD-10-CM | POA: Diagnosis not present

## 2021-05-01 DIAGNOSIS — R2689 Other abnormalities of gait and mobility: Secondary | ICD-10-CM | POA: Diagnosis not present

## 2021-05-01 DIAGNOSIS — H8111 Benign paroxysmal vertigo, right ear: Secondary | ICD-10-CM

## 2021-05-01 NOTE — Therapy (Signed)
[facility] [address] [place], [place], [address] Phone: [phone]   Fax:  [phone]  Physical Therapy Treatment  Patient Details  Name: Tracey [name] MRN: [id number] Date of Birth: April 02, 1946 Referring Provider (PT): Dr. Asencion Partridge Dohmeier   Encounter Date: 05/01/2021   PT End of Session - 05/01/21 1406     Visit Number 18    Number of Visits 19    Date for PT Re-Evaluation 05/09/21    Authorization Type Medicare A/B; [employer]    Progress Note Due on Visit 20    PT Start Time 1403    PT Stop Time 1443    PT Time Calculation (min) 40 min    Activity Tolerance Patient tolerated treatment well    Behavior During Therapy [facility] for tasks assessed/performed             Past Medical History:  Diagnosis Date   Allergy    Anxiety    takes Ativan daily prn   Arthritis    Breast cancer ([place]) 2014   ER+/PR+/HEr2-,    Bruises easily    Colon polyps    2 polyps by report   Diverticulosis    GERD (gastroesophageal reflux disease)    occasionally takes Nexium    Gout    takes Allopurinol daily and Colchicine daily prn;last attack 83yr ago   Gout    History of bladder infections    many yrs ago   History of bronchitis    last time at least 826yrago   History of stress incontinence    Hyperlipidemia    takes Pravastatin daily   Insomnia    takes Ambien nightly prn   [place]ST [place]   OSA (obstructive sleep apnea)    on CPAP   Osteoarthritis    Peripheral edema    takes Furosemide daily prn   PONV (postoperative nausea and vomiting)    Postmenopausal hormone therapy    Radiation 07/27/13-09/07/13   Right Breast x 31 treatments   Rhinitis    uses Flonase prn   Sinus congestion    Status post breast reconstruction 10/15/14   Bilateral implant removal and DIEP performed in [place]   Tachycardia    takes Metoprolol daily   Ventricular tachycardia,  non-sustained ([facility]   during sleep study 2009 with normal cardiac workup    Past Surgical History:  Procedure Laterality Date   ABDOMINAL HYSTERECTOMY     with BSO   APPENDECTOMY     AXILLARY SENTINEL NODE BIOPSY Right 05/23/2013   Procedure: AXILLARY SENTINEL NODE BIOPSY;  Surgeon: ToOdis HollingsheadMD;  [facility] Service: General;  Laterality: Right;  nuc med injection 7:00   BREAST RECONSTRUCTION WITH PLACEMENT OF TISSUE EXPANDER AND FLEX HD (ACELLULAR HYDRATED DERMIS) Bilateral 05/23/2013   Procedure: BILATERAL BREAST RECONSTRUCTION WITH PLACEMENT OF TISSUE EXPANDER AND FLEX HD;  Surgeon: DaCrissie ReeseMD;  [facility] Service: Pl[facility];  Laterality: Bilateral;   CARDIAC CATHETERIZATION  1999   CATARACT EXTRACTION, BILATERAL Bilateral    CHOLECYSTECTOMY     COSMETIC SURGERY     OTHER SURGICAL HISTORY  09/2017   had cyst removal from spine    PERCUTANEOUS CORONARY STENT INTERVENTION (PCI-S)  08/2017   [place]ILATERAL BREAST IMPLANTS Bilateral 05/17/2014   Procedure: REMOVAL OF BILATERAL TISSUE EXPANDERS WITH PLACEMENT OF  BILATERAL BREAST IMPLANTS FOR RECONSTRUCTION;  Surgeon: [name], MD;  Location: [facility];  Service: Plastics;  Laterality: Bilateral;   TONSILLECTOMY     TOTAL KNEE ARTHROPLASTY Right 07/26/2018   Procedure: TOTAL KNEE ARTHROPLASTY;  Surgeon: Paralee Cancel, MD;  Location: [facility];  Service: Orthopedics;  Laterality: Right;  70 mins   TOTAL MASTECTOMY Bilateral 05/23/2013   Procedure: TOTAL MASTECTOMY;  Surgeon: [name], MD;  Location: [facility];  Service: General;  Laterality: Bilateral;    Vitals:   05/01/21 1413  BP: 128/70     Subjective Assessment - 05/01/21 1405     Subjective Patient reports that she was weedeating this morning and then started to lose her balance on the R side but did not fall. No other new changes.    Patient is accompained by: Family member     Pertinent History Breast Cancer 2014, MI, Rt TKA    Limitations Walking    How long can you sit comfortably? unlimited    How long can you stand comfortably? unlimited    How long can you walk comfortably? unlimited    Patient Stated Goals To become more stable with her balance    Currently in Pain? No/denies              Vestibular Assessment - 05/01/21 0001       Positional Testing   Dix-Hallpike Dix-Hallpike Right;Dix-Hallpike Left      Dix-Hallpike Right   Dix-Hallpike Right Duration 15    Dix-Hallpike Right Symptoms Upbeat, right rotatory nystagmus      Dix-Hallpike Left   Dix-Hallpike Left Duration 0    Dix-Hallpike Left Symptoms No nystagmus               [facility] Adult PT Treatment/Exercise - 05/01/21 0001       Ambulation/Gait   Ambulation/Gait Yes    Ambulation/Gait Assistance 5: Supervision    Ambulation/Gait Assistance Details mild unsteadiness with ambuation around CRM    Assistive device None    Gait Pattern Within Functional Limits    Ambulation Surface Level;Indoor             Vestibular Treatment/Exercise - 05/01/21 0001       Vestibular Treatment/Exercise   Vestibular Treatment Provided Canalith Repositioning    Canalith Repositioning Epley Manuever Right       EPLEY MANUEVER RIGHT   Number of Reps  3    Overall Response Symptoms Resolved    Response Details  resolution noted; moderate dizziness initially                    PT Education - 05/01/21 1422     Education Details Continued BPPV    Person(s) Educated Patient    Methods Explanation    Comprehension Verbalized understanding              PT Short Term Goals - 03/10/21 2035       PT SHORT TERM GOAL #1   Title = LTG               PT Long Term Goals - 04/11/21 1118       PT LONG TERM GOAL #1   Title Patient to score >54 on BERG    Baseline 43 > 52 > 50    Time 4    Period Weeks    Status Revised      PT LONG TERM GOAL #2   Title Patient  to demo I  in final HEP    Baseline ongoing    Time 4    Period Weeks    Status Revised      PT LONG TERM GOAL #3   Title Patient to score 22 on DGI    Baseline Initial DGI 19 > 21/24    Time 4    Period Weeks    Status Revised      PT LONG TERM GOAL #4   Title Pt will demonstrate improved vestibular function and use of VOR as indicated by 2 line difference on DVA    Baseline 4 line difference (8,4) > 2 line difference (9, 7)    Status Achieved      PT LONG TERM GOAL #5   Title Pt will demonstrate ability to maintain balance when vision removed as indicated by increase in time to 15 seconds on MCTSIB condition 2 and 4    Baseline 4-5 seconds maximum; 9 seconds on 8/26    Time 4    Period Weeks    Status Revised      PT LONG TERM GOAL #6   Title Pt will demonstrate negative positional testing for R and L side and report no dizziness/vertigo with rolling or supine <> sit    Baseline Resolved    Status Achieved                   Plan - 05/01/21 1430     Clinical Impression Statement Patient continues to demo continued R Upbeating Rotary Nystagmus indicating continued R Posterior canal canalithiasis. Completed CRM x 3 reps with resolution of symptoms. Will continue to address due to increased reoccurence rate and progress toward LTGs.    Personal Factors and Comorbidities Comorbidity 3+    Comorbidities MI, Rt TKA, cancer    Examination-Activity Limitations Locomotion Level;Stand;Bed Mobility    Examination-Participation Restrictions Cleaning;Shop;Community Activity;Yard Work;Interpersonal Relationship    Rehab Potential Good    PT Frequency 2x / week    PT Duration 4 weeks    PT Treatment/Interventions ADLs/Self Care Home Management;Therapeutic activities;Patient/family education;Balance training;Gait training;Neuromuscular re-education;DME Instruction;Therapeutic exercise;Vestibular;Stair training;Functional mobility training;Canalith Repostioning    PT Next Visit  Plan Audra - please Recheckfor BPPV. May need to plan for re-cert due to continued BPPV. Continue to Progress VOR to busy background or x2 viewing.   Balance and walking with head turns. Balance reactions on rockerboard.  Medicine ball chops on foam for - Golf.  Bending down with ball between legs with turns/compliant surface.    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   CPAP (continuous positive airway pressure) dependence 01/20/2021   Unsteady gait when walking 01/20/2021   Complaint related to dreams 96/28/3662   Metabolic acidosis with increased anion gap and accumulation of organic acids 08/17/2018   Nausea, vomiting, and diarrhea 08/17/2018   AKI (acute kidney injury) ([place]) 08/17/2018   Overweight (BMI 25.0-29.9) 07/27/2018   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/27/2018   S/P right TKA 07/26/2018  S/P knee replacement 07/26/2018   UARS (upper airway resistance syndrome) 05/30/2018   Hyperlipidemia 05/02/2018   Leg injury, right, initial encounter 01/06/2017   Contusion of right lower leg 01/06/2017   Hypoxemia 12/23/2015   Statin myopathy 05/20/2015   MCI (mild cognitive impairment) 05/20/2015   Edema 05/17/2015   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 [place], [place], [address] Phone: [phone]   Fax:  [phone]  Name: DANYLLE OUK MRN: [id number] Date of Birth: [birth year]

## 2021-05-05 ENCOUNTER — Ambulatory Visit: Payer: Medicare Other | Admitting: Physical Therapy

## 2021-05-05 ENCOUNTER — Other Ambulatory Visit: Payer: Self-pay

## 2021-05-05 DIAGNOSIS — H8111 Benign paroxysmal vertigo, right ear: Secondary | ICD-10-CM

## 2021-05-05 DIAGNOSIS — R2681 Unsteadiness on feet: Secondary | ICD-10-CM

## 2021-05-05 DIAGNOSIS — R2689 Other abnormalities of gait and mobility: Secondary | ICD-10-CM | POA: Diagnosis not present

## 2021-05-05 DIAGNOSIS — R42 Dizziness and giddiness: Secondary | ICD-10-CM | POA: Diagnosis not present

## 2021-05-05 NOTE — Therapy (Signed)
[facility] [address] [place], [place], [address] Phone: [phone]   Fax:  [phone]  Physical Therapy Treatment  Patient Details  Name: Tracey [name] MRN: [id number] Date of Birth: August 05, 1946 Referring Provider (PT): Dr. Asencion Partridge Dohmeier   Encounter Date: 05/05/2021   PT End of Session - 05/05/21 1238     Visit Number 19    Number of Visits 20    Date for PT Re-Evaluation 05/09/21    Authorization Type Medicare A/B; [employer]    Progress Note Due on Visit 20    PT Start Time 1235    PT Stop Time 1320    PT Time Calculation (min) 45 min    Activity Tolerance Patient tolerated treatment well    Behavior During Therapy [facility] for tasks assessed/performed             Past Medical History:  Diagnosis Date   Allergy    Anxiety    takes Ativan daily prn   Arthritis    Breast cancer ([place]) 2014   ER+/PR+/HEr2-,    Bruises easily    Colon polyps    2 polyps by report   Diverticulosis    GERD (gastroesophageal reflux disease)    occasionally takes Nexium    Gout    takes Allopurinol daily and Colchicine daily prn;last attack 23yr ago   Gout    History of bladder infections    many yrs ago   History of bronchitis    last time at least 845yrago   History of stress incontinence    Hyperlipidemia    takes Pravastatin daily   Insomnia    takes Ambien nightly prn   NSTEMI (non-ST elevated [place] infarction) [place]   OSA (obstructive sleep apnea)    on CPAP   Osteoarthritis    Peripheral edema    takes Furosemide daily prn   PONV (postoperative nausea and vomiting)    Postmenopausal hormone therapy    Radiation 07/27/13-09/07/13   Right Breast x 31 treatments   Rhinitis    uses Flonase prn   Sinus congestion    Status post breast reconstruction 10/15/14   Bilateral implant removal and DIEP performed in [place]   Tachycardia    takes Metoprolol daily   Ventricular tachycardia,  non-sustained ([facility]   during sleep study 2009 with normal cardiac workup    Past Surgical History:  Procedure Laterality Date   ABDOMINAL HYSTERECTOMY     with BSO   APPENDECTOMY     AXILLARY SENTINEL NODE BIOPSY Right 05/23/2013   Procedure: AXILLARY SENTINEL NODE BIOPSY;  Surgeon: ToOdis HollingsheadMD;  [facility] Service: General;  Laterality: Right;  nuc med injection 7:00   BREAST RECONSTRUCTION WITH PLACEMENT OF TISSUE EXPANDER AND FLEX HD (ACELLULAR HYDRATED DERMIS) Bilateral 05/23/2013   Procedure: BILATERAL BREAST RECONSTRUCTION WITH PLACEMENT OF TISSUE EXPANDER AND FLEX HD;  Surgeon: DaCrissie ReeseMD;  [facility] Service: Plastics;  Laterality: Bilateral;   CARDIAC CATHETERIZATION  1999   CATARACT EXTRACTION, BILATERAL Bilateral    CHOLECYSTECTOMY     COSMETIC SURGERY     OTHER SURGICAL HISTORY  09/2017   had cyst removal from spine    PERCUTANEOUS CORONARY STENT INTERVENTION (PCI-S)  08/2017   [place] Bilateral 05/17/2014   Procedure: REMOVAL OF BILATERAL TISSUE EXPANDERS WITH PLACEMENT OF  BILATERAL BREAST IMPLANTS FOR RECONSTRUCTION;  Surgeon: [name], MD;  Location: [facility];  Service: Plastics;  Laterality: Bilateral;   TONSILLECTOMY     TOTAL KNEE ARTHROPLASTY Right 07/26/2018   Procedure: TOTAL KNEE ARTHROPLASTY;  Surgeon: Paralee Cancel, MD;  [facility];  Service: Orthopedics;  Laterality: Right;  70 mins   TOTAL MASTECTOMY Bilateral 05/23/2013   Procedure: TOTAL MASTECTOMY;  Surgeon: [name], MD;  Location: [facility];  Service: General;  Laterality: Bilateral;    There were no vitals filed for this visit.   Subjective Assessment - 05/05/21 1239     Subjective "Took her 3 times to get the crystals back in place but when they were, it made a big difference".  Reports vision feeling clearer and improved balance.  Feels good today, golfing tomorrow.   Continues to have more symptoms of disequilibrium and blurry vision; does not have severe spinning when BPPV present.    Patient is accompained by: Family member    Pertinent History Breast Cancer 2014, MI, Rt TKA    Limitations Walking    How long can you sit comfortably? unlimited    How long can you stand comfortably? unlimited    How long can you walk comfortably? unlimited    Patient Stated Goals To become more stable with her balance    Currently in Pain? No/denies               Vestibular Assessment - 05/05/21 1240       Positional Testing   Dix-Hallpike Dix-Hallpike Right;Dix-Hallpike Left    Horizontal Canal Testing Horizontal Canal Right;Horizontal Canal Left      Dix-Hallpike Right   Dix-Hallpike Right Duration 0    Dix-Hallpike Right Symptoms No nystagmus      Dix-Hallpike Left   Dix-Hallpike Left Duration 0    Dix-Hallpike Left Symptoms No nystagmus      Horizontal Canal Right   Horizontal Canal Right Duration 0    Horizontal Canal Right Symptoms Normal      Horizontal Canal Left   Horizontal Canal Left Duration 0    Horizontal Canal Left Symptoms Normal                 Vestibular Treatment/Exercise - 05/05/21 1247       Vestibular Treatment/Exercise   Vestibular Treatment Provided Canalith Repositioning;Gaze    Canalith Repositioning Epley Manuever Right    Gaze Exercises X1 Viewing Horizontal;X1 Viewing Vertical       EPLEY MANUEVER RIGHT   Number of Reps  2    Response Details  modified home manuever with pillow behind back; reviewed how to perform for home treatment.  Guided pt through maneuver and then had pt verbalize and demonstrate back to PT with written instructions.      X1 Viewing Horizontal   Foot Position busy background, feet together solid surface and then compliant surface    Reps 2    Comments 60 seconds      X1 Viewing Vertical   Foot Position busy background, feet together solid surface and then compliant surface     Reps 2    Comments 60 seconds                    PT Education - 05/05/21 1257     Education Details resolution of BPPV, plan for next visit to check goals and determine if pt is ready for D/C vs. recert; reviewed previous handout with home maneuver for R  BPPV    Person(s) Educated Patient    Methods Explanation;Demonstration;Handout    Comprehension Verbalized understanding;Returned demonstration              PT Short Term Goals - 03/10/21 2035       PT SHORT TERM GOAL #1   Title = LTG               PT Long Term Goals - 04/11/21 1118       PT LONG TERM GOAL #1   Title Patient to score >54 on BERG    Baseline 43 > 52 > 50    Time 4    Period Weeks    Status Revised      PT LONG TERM GOAL #2   Title Patient to demo I in final HEP    Baseline ongoing    Time 4    Period Weeks    Status Revised      PT LONG TERM GOAL #3   Title Patient to score 22 on DGI    Baseline Initial DGI 19 > 21/24    Time 4    Period Weeks    Status Revised      PT LONG TERM GOAL #4   Title Pt will demonstrate improved vestibular function and use of VOR as indicated by 2 line difference on DVA    Baseline 4 line difference (8,4) > 2 line difference (9, 7)    Status Achieved      PT LONG TERM GOAL #5   Title Pt will demonstrate ability to maintain balance when vision removed as indicated by increase in time to 15 seconds on MCTSIB condition 2 and 4    Baseline 4-5 seconds maximum; 9 seconds on 8/26    Time 4    Period Weeks    Status Revised      PT LONG TERM GOAL #6   Title Pt will demonstrate negative positional testing for R and L side and report no dizziness/vertigo with rolling or supine <> sit    Baseline Resolved    Status Achieved                   Plan - 05/05/21 1319     Clinical Impression Statement Pt demonstrates resolution of R BPPV today; pt unable to recall home Epley maneuver provided to her in July.  Reviewed home maneuver and had pt  return demonstrate with new handout.  Began to update and progress HEP by progressing VOR to compliant surface with busy background.  If BPPV continues to be resolved next session will complete assessmet of LTG, complete update of HEP and plan to D/C.    Personal Factors and Comorbidities Comorbidity 3+    Comorbidities MI, Rt TKA, cancer    Examination-Activity Limitations Locomotion Level;Stand;Bed Mobility    Examination-Participation Restrictions Cleaning;Shop;Community Activity;Yard Work;Interpersonal Relationship    Rehab Potential Good    PT Frequency 2x / week    PT Duration 4 weeks    PT Treatment/Interventions ADLs/Self Care Home Management;Therapeutic activities;Patient/family education;Balance training;Gait training;Neuromuscular re-education;DME Instruction;Therapeutic exercise;Vestibular;Stair training;Functional mobility training;Canalith Repostioning    PT Next Visit Plan 10th visit PN.  Make sure R side has resolved.  If so, check LTG and update HEP.  Print two copies of HEP - one for [employer] and one for [place].    Consulted and Agree with Plan of Care Patient             Patient will benefit  from skilled therapeutic intervention in order to improve the following deficits and impairments:  Decreased mobility, Decreased balance, Dizziness, Difficulty walking, Impaired vision/preception, Impaired sensation, Decreased activity tolerance  Visit Diagnosis: BPPV (benign paroxysmal positional vertigo), right  Dizziness and giddiness  Unsteadiness on feet  Other abnormalities of gait and mobility     Problem List Patient Active Problem List   Diagnosis Date Noted   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   CPAP (continuous positive airway pressure) dependence 01/20/2021   Unsteady gait when walking 01/20/2021   Complaint related to dreams 27/01/2375   Metabolic acidosis with increased anion gap and accumulation of organic acids  08/17/2018   Nausea, vomiting, and diarrhea 08/17/2018   AKI (acute kidney injury) ([place]) 08/17/2018   Overweight (BMI 25.0-29.9) 07/27/2018   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/27/2018   S/P right TKA 07/26/2018   S/P knee replacement 07/26/2018   UARS (upper airway resistance syndrome) 05/30/2018   Hyperlipidemia 05/02/2018   Leg injury, right, initial encounter 01/06/2017   Contusion of right lower leg 01/06/2017   Hypoxemia 12/23/2015   Statin myopathy 05/20/2015   MCI (mild cognitive impairment) 05/20/2015   Edema 05/17/2015   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 [place], [place], [address] Phone: [phone]   Fax:  [phone]  Name: MADDI COLLAR MRN: [id number] Date of Birth: 09-05-1945

## 2021-05-05 NOTE — Patient Instructions (Addendum)
Home Treatment for Spinning:     Gaze Stabilization: Standing Feet Together (Compliant Surface)    Using a busy background. Feet together on pillow, keeping eyes on target on wall __3__ feet away, tilt head down 15-30 and move head side to side for __60__ seconds.  Repeat while moving head up and down for __60__ seconds. Do __2__ sessions per day.

## 2021-05-07 ENCOUNTER — Ambulatory Visit: Payer: Medicare Other

## 2021-05-08 ENCOUNTER — Other Ambulatory Visit: Payer: Self-pay

## 2021-05-08 ENCOUNTER — Ambulatory Visit: Payer: Medicare Other

## 2021-05-08 DIAGNOSIS — R2681 Unsteadiness on feet: Secondary | ICD-10-CM | POA: Diagnosis not present

## 2021-05-08 DIAGNOSIS — R2689 Other abnormalities of gait and mobility: Secondary | ICD-10-CM | POA: Diagnosis not present

## 2021-05-08 DIAGNOSIS — R42 Dizziness and giddiness: Secondary | ICD-10-CM | POA: Diagnosis not present

## 2021-05-08 DIAGNOSIS — H8111 Benign paroxysmal vertigo, right ear: Secondary | ICD-10-CM | POA: Diagnosis not present

## 2021-05-08 NOTE — Therapy (Signed)
[facility] [address] [place], [place], [address] Phone: [phone]   Fax:  [phone]  Physical Therapy Treatment/Progress Note/Discharge Summary  Patient Details  Name: Tracey [name] MRN: [id number] Date of Birth: 02/08/1946 Referring Provider (PT): Dr. Carmen Dohmeier  Physical Therapy Progress Note   Dates of Reporting Period: 04/08/21 - 05/08/21  See Note below for Objective Data and Assessment of Progress/Goals.  Thank you for the referral of this patient. Brooke , PT, DPT   PHYSICAL THERAPY DISCHARGE SUMMARY  Visits from Start of Care: 20  Current functional level related to goals / functional outcomes: See Clinical Impression Statement   Remaining deficits: Mild Imbalance   Education / Equipment: HEP; Self Maneuver for R BPPV   Patient agrees to discharge. Patient goals were met. Patient is being discharged due to meeting the stated rehab goals.  Encounter Date: 05/08/2021   PT End of Session - 05/08/21 1406     Visit Number 20    Number of Visits 20    Date for PT Re-Evaluation 05/09/21    Authorization Type Medicare A/B; [employer]    Progress Note Due on Visit 20    PT Start Time 1401    PT Stop Time 1443    PT Time Calculation (min) 42 min    Activity Tolerance Patient tolerated treatment well    Behavior During Therapy WFL for tasks assessed/performed             Past Medical History:  Diagnosis Date   Allergy    Anxiety    takes Ativan daily prn   Arthritis    Breast cancer (HCC) 2014   ER+/PR+/HEr2-,    Bruises easily    Colon polyps    2 polyps by report   Diverticulosis    GERD (gastroesophageal reflux disease)    occasionally takes Nexium    Gout    takes Allopurinol daily and Colchicine daily prn;last attack 3yrs ago   Gout    History of bladder infections    many yrs ago   History of bronchitis    last time at least 8yrs ago   History of stress  incontinence    Hyperlipidemia    takes Pravastatin daily   Insomnia    takes Ambien nightly prn   NSTEMI (non-ST elevated myocardial infarction) (HCC) 08/2017   OSA (obstructive sleep apnea)    on CPAP   Osteoarthritis    Peripheral edema    takes Furosemide daily prn   PONV (postoperative nausea and vomiting)    Postmenopausal hormone therapy    Radiation 07/27/13-09/07/13   Right Breast x 31 treatments   Rhinitis    uses Flonase prn   Sinus congestion    Status post breast reconstruction 10/15/14   Bilateral implant removal and DIEP performed in [place], [place]   Tachycardia    takes Metoprolol daily   Ventricular tachycardia, non-sustained (HCC)    during sleep study 2009 with normal cardiac workup    Past Surgical History:  Procedure Laterality Date   ABDOMINAL HYSTERECTOMY     with BSO   APPENDECTOMY     AXILLARY SENTINEL NODE BIOPSY Right 05/23/2013   Procedure: AXILLARY SENTINEL NODE BIOPSY;  Surgeon: Todd J Rosenbower, MD;  Location: [facility] OR;  Service: General;  Laterality: Right;  nuc med injection 7:00   BREAST RECONSTRUCTION WITH PLACEMENT OF TISSUE EXPANDER AND FLEX HD (ACELLULAR HYDRATED DERMIS) Bilateral 05/23/2013   Procedure: BILATERAL BREAST RECONSTRUCTION   WITH PLACEMENT OF TISSUE EXPANDER AND FLEX HD;  Surgeon: David Bowers, MD;  Location: [facility] OR;  Service: Plastics;  Laterality: Bilateral;   CARDIAC CATHETERIZATION  1999   CATARACT EXTRACTION, BILATERAL Bilateral    CHOLECYSTECTOMY     COSMETIC SURGERY     OTHER SURGICAL HISTORY  09/2017   had cyst removal from spine    PERCUTANEOUS CORONARY STENT INTERVENTION (PCI-S)  08/2017   done in [place]     REMOVAL OF BILATERAL TISSUE EXPANDERS WITH PLACEMENT OF BILATERAL BREAST IMPLANTS Bilateral 05/17/2014   Procedure: REMOVAL OF BILATERAL TISSUE EXPANDERS WITH PLACEMENT OF BILATERAL BREAST IMPLANTS FOR RECONSTRUCTION;  Surgeon: David Bowers, MD;  Location: [facility] OR;  Service: Plastics;  Laterality: Bilateral;    TONSILLECTOMY     TOTAL KNEE ARTHROPLASTY Right 07/26/2018   Procedure: TOTAL KNEE ARTHROPLASTY;  Surgeon: Olin, Matthew, MD;  Location: [facility];  Service: Orthopedics;  Laterality: Right;  70 mins   TOTAL MASTECTOMY Bilateral 05/23/2013   Procedure: TOTAL MASTECTOMY;  Surgeon: Todd J Rosenbower, MD;  Location: [facility] OR;  Service: General;  Laterality: Bilateral;    There were no vitals filed for this visit.   Subjective Assessment - 05/08/21 1403     Subjective Patient reports no new changes. Reports that she is feeling much better. Patient reports still playing golf, still doing well with that.    Patient is accompained by: Family member    Pertinent History Breast Cancer 2014, MI, Rt TKA    Limitations Walking    How long can you sit comfortably? unlimited    How long can you stand comfortably? unlimited    How long can you walk comfortably? unlimited    Patient Stated Goals To become more stable with her balance    Currently in Pain? No/denies                [facility] PT Assessment - 05/08/21 0001       Standardized Balance Assessment   Standardized Balance Assessment Dynamic Gait Index;Berg Balance Test      Berg Balance Test   Sit to Stand Able to stand without using hands and stabilize independently    Standing Unsupported Able to stand safely 2 minutes    Sitting with Back Unsupported but Feet Supported on Floor or Stool Able to sit safely and securely 2 minutes    Stand to Sit Sits safely with minimal use of hands    Transfers Able to transfer safely, minor use of hands    Standing Unsupported with Eyes Closed Able to stand 10 seconds safely    Standing Unsupported with Feet Together Able to place feet together independently and stand 1 minute safely    From Standing, Reach Forward with Outstretched Arm Can reach confidently >25 cm (10")    From Standing Position, Pick up Object from Floor Able to pick up shoe safely and easily    From Standing Position, Turn to Look  Behind Over each Shoulder Looks behind from both sides and weight shifts well    Turn 360 Degrees Able to turn 360 degrees safely one side only in 4 seconds or less    Standing Unsupported, Alternately Place Feet on Step/Stool Able to stand independently and safely and complete 8 steps in 20 seconds    Standing Unsupported, One Foot in Front Able to place foot tandem independently and hold 30 seconds    Standing on One Leg Able to lift leg independently and hold 5-10 seconds      Total Score 54    Berg comment: 54/56      Dynamic Gait Index   Level Surface Normal    Change in Gait Speed Normal    Gait with Horizontal Head Turns Mild Impairment    Gait with Vertical Head Turns Normal    Gait and Pivot Turn Normal    Step Over Obstacle Normal    Step Around Obstacles Normal    Steps Normal    Total Score 23    DGI comment: 23/24      High Level Balance   High Level Balance Comments Completed M-CTSIB: able to hold situation 1-3 for full 30 seconds, situation 4: 23 seconds              Vestibular Assessment - 05/08/21 0001       Positional Testing   Dix-Hallpike Dix-Hallpike Right;Dix-Hallpike Left      Dix-Hallpike Right   Dix-Hallpike Right Duration 0    Dix-Hallpike Right Symptoms No nystagmus      Dix-Hallpike Left   Dix-Hallpike Left Duration 0    Dix-Hallpike Left Symptoms No nystagmus             1) Gaze Stabilization: Standing Feet Together (Compliant Surface)    Using a busy background. Feet together on pillow, keeping eyes on target on wall __3__ feet away, tilt head down 15-30 and move head side to side for __60__ seconds.  Repeat while moving head up and down for __60__ seconds. Do __2__ sessions per day    2) Bending to Pick up              Stand with feet apart and an object resting on a low chair/ottoman 2 feet off the ground.  Face the object - look down at object, reach down and pick up, standing back up.  Look down and put it back down.   Repeat 5 times.     Turn so the object is to the RIGHT.  Using your LEFT hand, reach across and pick up the object.  Look back over to the right and put it back down.  Repeat 5 times with object to the right.     Turn around so the object is to the LEFT.  Repeat using your RIGHT hand, 5 times.      3) Feet Together (Compliant Surface) - Eyes Closed    Stand on compliant surface: stand with feet together and arms by your side. Close eyes and visualize upright position. Hold 30 seconds. NO HEAD MOTION.  Repeat 2 times per session. Do 2 sessions per day.       4) Feet Together, Head Motion - Eyes Closed    With eyes closed and feet together, move head slowly, up and down 10 times. Then move head slowly right and left 10 times.  Repeat 2 times per session. Do 2 sessions per day.      5) Tandem Stance    Right foot in front of left, heel touching toe both feet "straight ahead". Stand on Foot Triangle of Support with both feet. Balance in this position 30 seconds. Do with left foot in front of right. Completed 2 reps each.   Copyright  VHI. All rights reserved   Home Treatment for Spinning:                      PT Education - 05/08/21 1443     Education Details Progress toward LTG; HEP; Additional Handout   for Home Manuever    Person(s) Educated Patient    Methods Explanation;Handout    Comprehension Verbalized understanding              PT Short Term Goals - 03/10/21 2035       PT SHORT TERM GOAL #1   Title = LTG               PT Long Term Goals - 05/08/21 1415       PT LONG TERM GOAL #1   Title Patient to score >54 on BERG    Baseline 43 > 52 > 50; 54 on 9/22    Time 4    Period Weeks    Status Achieved      PT LONG TERM GOAL #2   Title Patient to demo I in final HEP    Baseline ongoing; reports independence with HEP    Time 4    Period Weeks    Status Achieved      PT LONG TERM GOAL #3   Title Patient to score 22 on DGI     Baseline Initial DGI 19 > 21/24; 23/24    Time 4    Period Weeks    Status Achieved      PT LONG TERM GOAL #4   Title Pt will demonstrate improved vestibular function and use of VOR as indicated by 2 line difference on DVA    Baseline 4 line difference (8,4) > 2 line difference (9, 7)    Status Achieved      PT LONG TERM GOAL #5   Title Pt will demonstrate ability to maintain balance when vision removed as indicated by increase in time to 15 seconds on MCTSIB condition 2 and 4    Baseline 4-5 seconds maximum; 9 seconds on 8/26; 23 seconds on 9/22    Time 4    Period Weeks    Status Achieved      PT LONG TERM GOAL #6   Title Pt will demonstrate negative positional testing for R and L side and report no dizziness/vertigo with rolling or supine <> sit    Baseline Resolved    Status Achieved                   Plan - 05/08/21 1445     Clinical Impression Statement Completed assesment of progress toward all LTGs, with patient able to meet all goals at this time. Improved Berg Balance to 54/56 and DGI to 23/24 demonstrating significant improvements with balance. Patient continue to demo negative positional testing indicating continued resolution of R BPPV. Reviewed HEP and home self manuever for R BPPV in case of reoccurnece. Patient demo readiness to d/c from PT services at this time, with patient agreeable.    Personal Factors and Comorbidities Comorbidity 3+    Comorbidities MI, Rt TKA, cancer    Examination-Activity Limitations Locomotion Level;Stand;Bed Mobility    Examination-Participation Restrictions Cleaning;Shop;Community Activity;Yard Work;Interpersonal Relationship    Rehab Potential Good    PT Frequency 2x / week    PT Duration 4 weeks    PT Treatment/Interventions ADLs/Self Care Home Management;Therapeutic activities;Patient/family education;Balance training;Gait training;Neuromuscular re-education;DME Instruction;Therapeutic exercise;Vestibular;Stair  training;Functional mobility training;Canalith Repostioning    PT Next Visit Plan --    Consulted and Agree with Plan of Care Patient             Patient will benefit from skilled therapeutic intervention in order to improve the following deficits and impairments:  Decreased   mobility, Decreased balance, Dizziness, Difficulty walking, Impaired vision/preception, Impaired sensation, Decreased activity tolerance  Visit Diagnosis: Dizziness and giddiness  Unsteadiness on feet     Problem List Patient Active Problem List   Diagnosis Date Noted   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   CPAP (continuous positive airway pressure) dependence 01/20/2021   Unsteady gait when walking 01/20/2021   Complaint related to dreams 02/77/4128   Metabolic acidosis with increased anion gap and accumulation of organic acids 08/17/2018   Nausea, vomiting, and diarrhea 08/17/2018   AKI (acute kidney injury) ([place]) 08/17/2018   Overweight (BMI 25.0-29.9) 07/27/2018   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/27/2018   S/P right TKA 07/26/2018   S/P knee replacement 07/26/2018   UARS (upper airway resistance syndrome) 05/30/2018   Hyperlipidemia 05/02/2018   Leg injury, right, initial encounter 01/06/2017   Contusion of right lower leg 01/06/2017   Hypoxemia 12/23/2015   Statin myopathy 05/20/2015   MCI (mild cognitive impairment) 05/20/2015   Edema 05/17/2015   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 [place], [place], [address] Phone: [phone]   Fax:  [phone]  Name: Tracey [name] MRN: [id number] Date of Birth: [birth year]

## 2021-05-08 NOTE — Patient Instructions (Addendum)
1) Gaze Stabilization: Standing Feet Together (Compliant Surface)    Using a busy background. Feet together on pillow, keeping eyes on target on wall __3__ feet away, tilt head down 15-30 and move head side to side for __60__ seconds.  Repeat while moving head up and down for __60__ seconds. Do __2__ sessions per day    2) Bending to Pick up              Stand with feet apart and an object resting on a low chair/ottoman 2 feet off the ground.  Face the object - look down at object, reach down and pick up, standing back up.  Look down and put it back down.  Repeat 5 times.     Turn so the object is to the RIGHT.  Using your LEFT hand, reach across and pick up the object.  Look back over to the right and put it back down.  Repeat 5 times with object to the right.     Turn around so the object is to the LEFT.  Repeat using your RIGHT hand, 5 times.      3) Feet Together (Compliant Surface) - Eyes Closed    Stand on compliant surface: stand with feet together and arms by your side. Close eyes and visualize upright position. Hold 30 seconds. NO HEAD MOTION.  Repeat 2 times per session. Do 2 sessions per day.       4) Feet Together, Head Motion - Eyes Closed    With eyes closed and feet together, move head slowly, up and down 10 times. Then move head slowly right and left 10 times.  Repeat 2 times per session. Do 2 sessions per day.      5) Tandem Stance    Right foot in front of left, heel touching toe both feet "straight ahead". Stand on Foot Triangle of Support with both feet. Balance in this position 30 seconds. Do with left foot in front of right. Completed 2 reps each.   Copyright  VHI. All rights reserved

## 2021-05-11 ENCOUNTER — Other Ambulatory Visit: Payer: Self-pay | Admitting: Cardiovascular Disease

## 2021-05-15 DIAGNOSIS — M25562 Pain in left knee: Secondary | ICD-10-CM | POA: Diagnosis not present

## 2021-05-15 DIAGNOSIS — M1712 Unilateral primary osteoarthritis, left knee: Secondary | ICD-10-CM | POA: Diagnosis not present

## 2021-05-22 ENCOUNTER — Other Ambulatory Visit: Payer: Self-pay | Admitting: Cardiovascular Disease

## 2021-05-30 ENCOUNTER — Other Ambulatory Visit: Payer: Self-pay | Admitting: Cardiovascular Disease

## 2021-06-02 ENCOUNTER — Other Ambulatory Visit: Payer: Self-pay | Admitting: Cardiovascular Disease

## 2021-06-02 ENCOUNTER — Encounter (HOSPITAL_BASED_OUTPATIENT_CLINIC_OR_DEPARTMENT_OTHER): Payer: Self-pay | Admitting: Family

## 2021-06-02 ENCOUNTER — Other Ambulatory Visit: Payer: Self-pay

## 2021-06-02 ENCOUNTER — Ambulatory Visit (INDEPENDENT_AMBULATORY_CARE_PROVIDER_SITE_OTHER): Payer: Medicare Other | Admitting: Family

## 2021-06-02 VITALS — BP 130/80 | HR 57 | Ht 61.5 in | Wt 146.8 lb

## 2021-06-02 DIAGNOSIS — E785 Hyperlipidemia, unspecified: Secondary | ICD-10-CM | POA: Diagnosis not present

## 2021-06-02 DIAGNOSIS — K219 Gastro-esophageal reflux disease without esophagitis: Secondary | ICD-10-CM

## 2021-06-02 DIAGNOSIS — I10 Essential (primary) hypertension: Secondary | ICD-10-CM | POA: Diagnosis not present

## 2021-06-02 DIAGNOSIS — I25118 Atherosclerotic heart disease of native coronary artery with other forms of angina pectoris: Secondary | ICD-10-CM | POA: Diagnosis not present

## 2021-06-02 MED ORDER — PANTOPRAZOLE SODIUM 40 MG PO TBEC: 40.0000 mg | DELAYED_RELEASE_TABLET | Freq: Every day | ORAL | 3 refills | Status: DC

## 2021-06-02 MED ORDER — NITROGLYCERIN 0.4 MG SL SUBL: 0.4000 mg | SUBLINGUAL_TABLET | SUBLINGUAL | 3 refills | Status: DC | PRN

## 2021-06-02 MED ORDER — CLOPIDOGREL BISULFATE 75 MG PO TABS: 75.0000 mg | ORAL_TABLET | Freq: Every day | ORAL | 3 refills | Status: DC

## 2021-06-02 MED ORDER — ROSUVASTATIN CALCIUM 20 MG PO TABS: 20.0000 mg | ORAL_TABLET | Freq: Every day | ORAL | 3 refills | Status: DC

## 2021-06-02 NOTE — Patient Instructions (Signed)
Medication Instructions:  Continue your current medications.   *If you need a refill on your cardiac medications before your next appointment, please call your pharmacy*   Lab Work: Continue your current medications.   If you have labs (blood work) drawn today and your tests are completely normal, you will receive your results only by: [place] (if you have MyChart) OR A paper copy in the mail If you have any lab test that is abnormal or we need to change your treatment, we will call you to review the results.   Testing/Procedures: Your EKG today showed sinus bradycardia which is a stable finding. There are no acute changes.    Follow-Up: At [facility], you and your health needs are our priority.  As part of our continuing mission to provide you with exceptional heart care, we have created designated Provider Care Teams.  These Care Teams include your primary Cardiologist (physician) and Advanced Practice Providers (APPs -  Physician Assistants and Nurse Practitioners) who all work together to provide you with the care you need, when you need it.  We recommend signing up for the patient portal called "MyChart".  Sign up information is provided on this After Visit Summary.  MyChart is used to connect with patients for Virtual Visits (Telemedicine).  Patients are able to view lab/test results, encounter notes, upcoming appointments, etc.  Non-urgent messages can be sent to your provider as well.   To learn more about what you can do with MyChart, go to [email or web address].    Your next appointment:   June 2023  The format for your next appointment:   In Person  Provider:   You may see Sherren Mocha, MD or one of the following Advanced Practice Providers on your designated Care Team:   Richardson Dopp, PA-C [place], [place] Loel Dubonnet, NP    Other Instructions  Recommend wearing compression stockings during the day. Knee high and a measurement of 15-20 mmHg  would be sufficient. The higher measurement like 20-30 mmHg might be too restrictive.   Heart Healthy Diet Recommendations: A low-salt diet is recommended. Meats should be grilled, baked, or boiled. Avoid fried foods. Focus on lean protein sources like fish or chicken with vegetables and fruits. The American Heart Association is a Microbiologist!    Exercise recommendations: The American Heart Association recommends 150 minutes of moderate intensity exercise weekly. Try 30 minutes of moderate intensity exercise 4-5 times per week. This could include walking, jogging, or swimming.    Orthostatic Hypotension Blood pressure is a measurement of how strongly, or weakly, your circulating blood is pressing against the walls of your arteries. Orthostatic hypotension is a drop in blood pressure that can happen when you change positions, such as when you go from lying down to standing. Arteries are blood vessels that carry blood from your heart throughout your body. When blood pressure is too low, you may not get enough blood to your brain or to the rest of your organs. Orthostatic hypotension can cause light-headedness, sweating, rapid heartbeat, blurred vision, and fainting. These symptoms require further investigation into the cause. What are the causes? Orthostatic hypotension can be caused by many things, including: Sudden changes in posture, such as standing up quickly after you have been sitting or lying down. Loss of blood (anemia) or loss of body fluids (dehydration). Heart problems, neurologic problems, or hormone problems. Pregnancy. Aging. The risk for this condition increases as you get older. Severe infection (sepsis). Certain medicines, such as medicines  for high blood pressure or medicines that make the body lose excess fluids (diuretics). What are the signs or symptoms? Symptoms of this condition may include: Weakness, light-headedness, or dizziness. Sweating. Blurred  vision. Tiredness (fatigue). Rapid heartbeat. Fainting, in severe cases. How is this diagnosed? This condition is diagnosed based on: Your symptoms and medical history. Your blood pressure measurements. Your health care provider will check your blood pressure when you are: Lying down. Sitting. Standing. A blood pressure reading is recorded as two numbers, such as "120 over 80" (or 120/80). The first ("top") number is called the systolic pressure. It is a measure of the pressure in your arteries as your heart beats. The second ("bottom") number is called the diastolic pressure. It is a measure of the pressure in your arteries when your heart relaxes between beats. Blood pressure is measured in a unit called mmHg. Healthy blood pressure for most adults is 120/80 mmHg. Orthostatic hypotension is defined as a 20 mmHg drop in systolic pressure or a 10 mmHg drop in diastolic pressure within 3 minutes of standing. Other information or tests that may be used to diagnose orthostatic hypotension include: Your other vital signs, such as your heart rate and temperature. Blood tests. An electrocardiogram (ECG) or echocardiogram. A Holter monitor. This is a device you wear that records your heart rhythm continuously, usually for 24-48 hours. Tilt table test. For this test, you will be safely secured to a table that moves you from a lying position to an upright position. Your heart rhythm and blood pressure will be monitored during the test. How is this treated? This condition may be treated by: Changing your diet. This may involve eating more salt (sodium) or drinking more water. Changing the dosage of certain medicines you are taking that might be lowering your blood pressure. Correcting the underlying reason for the orthostatic hypotension. Wearing compression stockings. Taking medicines to raise your blood pressure. Avoiding actions that trigger symptoms. Follow these instructions at  home: Medicines Take over-the-counter and prescription medicines only as told by your health care provider. Follow instructions from your health care provider about changing the dosage of your current medicines, if this applies. Do not stop or adjust any of your medicines on your own. Eating and drinking  Drink enough fluid to keep your urine pale yellow. Eat extra salt only as directed. Do not add extra salt to your diet unless advised by your health care provider. Eat frequent, small meals. Avoid standing up suddenly after eating. General instructions  Get up slowly from lying down or sitting positions. This gives your blood pressure a chance to adjust. Avoid hot showers and excessive heat as directed by your health care provider. Engage in regular physical activity as directed by your health care provider. If you have compression stockings, wear them as told. Keep all follow-up visits. This is important. Contact a health care provider if: You have a fever for more than 2-3 days. You feel more thirsty than usual. You feel dizzy or weak. Get help right away if: You have chest pain. You have a fast or irregular heartbeat. You become sweaty or feel light-headed. You feel short of breath. You faint. You have any symptoms of a stroke. "BE FAST" is an easy way to remember the main warning signs of a stroke: B - Balance. Signs are dizziness, sudden trouble walking, or loss of balance. E - Eyes. Signs are trouble seeing or a sudden change in vision. F - Face. Signs are sudden weakness  or numbness of the face, or the face or eyelid drooping on one side. A - Arms. Signs are weakness or numbness in an arm. This happens suddenly and usually on one side of the body. S - Speech. Signs are sudden trouble speaking, slurred speech, or trouble understanding what people say. T - Time. Time to call emergency services. Write down what time symptoms started. You have other signs of a stroke, such  as: A sudden, severe headache with no known cause. Nausea or vomiting. Seizure. These symptoms may represent a serious problem that is an emergency. Do not wait to see if the symptoms will go away. Get medical help right away. Call your local emergency services (911 in the [place] Do not drive yourself to the hospital. Summary Orthostatic hypotension is a sudden drop in blood pressure. It can cause light-headedness, sweating, rapid heartbeat, blurred vision, and fainting. Orthostatic hypotension can be diagnosed by having your blood pressure taken while lying down, sitting, and then standing. Treatment may involve changing your diet, wearing compression stockings, sitting up slowly, adjusting your medicines, or correcting the underlying reason for the orthostatic hypotension. Get help right away if you have chest pain, a fast or irregular heartbeat, or symptoms of a stroke. This information is not intended to replace advice given to you by your health care provider. Make sure you discuss any questions you have with your health care provider. Document Revised: 10/17/2020 Document Reviewed: 10/17/2020 Elsevier Patient Education  [place].

## 2021-06-02 NOTE — Progress Notes (Signed)
Office Visit    Patient Name: JANAH Tracey [name] Date of Encounter: 06/02/2021  PCP:  [name], [place]  Cardiologist:  Sherren Mocha, MD  Advanced Practice Provider:  No care team member to display Electrophysiologist:  None      Chief Complaint    Tracey [name] is a 75 y.o. female with a hx of coronary disease, hyperlipidemia, hypertension presents today for follow up of coronary disease.   Past Medical History    Past Medical History:  Diagnosis Date   Allergy    Anxiety    takes Ativan daily prn   Arthritis    Breast cancer ([place]) 2014   ER+/PR+/HEr2-,    Bruises easily    Colon polyps    2 polyps by report   Diverticulosis    GERD (gastroesophageal reflux disease)    occasionally takes Nexium    Gout    takes Allopurinol daily and Colchicine daily prn;last attack 26yr ago   Gout    History of bladder infections    many yrs ago   History of bronchitis    last time at least 828yrago   History of stress incontinence    Hyperlipidemia    takes Pravastatin daily   Insomnia    takes Ambien nightly prn   [place]ST [place]   OSA (obstructive sleep apnea)    on CPAP   Osteoarthritis    Peripheral edema    takes Furosemide daily prn   PONV (postoperative nausea and vomiting)    Postmenopausal hormone therapy    Radiation 07/27/13-09/07/13   Right Breast x 31 treatments   Rhinitis    uses Flonase prn   Sinus congestion    Status post breast reconstruction 10/15/14   Bilateral implant removal and DIEP performed in [place], [place]   Tachycardia    takes Metoprolol daily   Ventricular tachycardia, non-sustained    during sleep study 2009 with normal cardiac workup   Past Surgical History:  Procedure Laterality Date   ABDOMINAL HYSTERECTOMY     with BSO   APPENDECTOMY     AXILLARY SENTINEL NODE BIOPSY Right 05/23/2013   Procedure: AXILLARY SENTINEL NODE BIOPSY;  Surgeon: ToOdis HollingsheadMD;  [facility] Service: General;  Laterality: Right;  nuc med injection 7:00   BREAST RECONSTRUCTION WITH PLACEMENT OF TISSUE EXPANDER AND FLEX HD (ACELLULAR HYDRATED DERMIS) Bilateral 05/23/2013   Procedure: BILATERAL BREAST RECONSTRUCTION WITH PLACEMENT OF TISSUE EXPANDER AND FLEX HD;  Surgeon: DaCrissie ReeseMD;  [facility] Service: Plastics;  Laterality: Bilateral;   CARDIAC CATHETERIZATION  1999   CATARACT EXTRACTION, BILATERAL Bilateral    CHOLECYSTECTOMY     COSMETIC SURGERY     OTHER SURGICAL HISTORY  09/2017   had cyst removal from spine    PERCUTANEOUS CORONARY STENT INTERVENTION (PCI-S)  08/2017   [place] Bilateral 05/17/2014   Procedure: REMOVAL OF BILATERAL TISSUE EXPANDERS WITH PLACEMENT OF BILATERAL BREAST IMPLANTS FOR RECONSTRUCTION;  Surgeon: DaCrissie ReeseMD;  [facility]: [facility];  Laterality: Bilateral;   TONSILLECTOMY     TOTAL KNEE ARTHROPLASTY Right 07/26/2018   Procedure: TOTAL KNEE ARTHROPLASTY;  Surgeon: OlParalee CancelMD;  Location: [facility];  Service: Orthopedics;  Laterality: Right;  70 mins   TOTAL MASTECTOMY Bilateral 05/23/2013   Procedure: TOTAL  MASTECTOMY;  Surgeon: [name], MD;  Location: [facility];  Service: General;  Laterality: Bilateral;    Allergies  Allergies  Allergen Reactions   Pseudoeph-Hydrocodone-Gg Other (See Comments)   Ivp Dye [Iodinated Diagnostic Agents] Hives   Sulfa Antibiotics Swelling   Tape Other (See Comments)    Unknown; has paper thin skin ; ok to use paper tape     History of Present Illness    Tracey [name] is a 75 y.o. female with a hx of coronary artery disease, hyperlipidemia, breast cancer, osteoarthritis hypertension last seen 04/2020 by Dr. Burt Knack.  She presented with NSTEMI January 2019 while in [place].  She was treated with PCI of LAD with overlapping DES.  Follow-up nuclear  stress test 10/2017, 05/2019, 1/02021 with no ischemia.  Very pleasant lady presents for follow-up.  She and her husband spend half of the year in [place] and the other half in [place].  She does note that with [place] altitude changes she has to remain on oxygen therapy.  She enjoys playing golf in her spare time. She has been having dizzy spells. She did physical therapy without much improvement. She was told that oxygen wasn't getting to her brain fast enough. Sounds as if she was diagnosed with orthostatic hypotension. Worse with positional movements of the head. She has vertigo but more recent lightheadedness feels different. She uses a cane sometimes for stability. No chest pain, pressure, tightness.  EKGs/Labs/Other Studies Reviewed:   The following studies were reviewed today:   EKG:  EKG is ordered today.  The ekg ordered today demonstrates SB 57 bpm with no acute ST/T wave changes.   Recent Labs: No results found for requested labs within last 8760 hours.  Recent Lipid Panel    Component Value Date/Time   CHOL 128 05/22/2019 0756   TRIG 121 05/22/2019 0756   HDL 58 05/22/2019 0756   CHOLHDL 2.2 05/22/2019 0756   LDLCALC 49 05/22/2019 0756    Home Medications   Current Meds  Medication Sig   allopurinol (ZYLOPRIM) 100 MG tablet TAKE 1 TABLET BY MOUTH ONCE DAILY   amoxicillin (AMOXIL) 500 MG tablet Take 4 tablets by mouth as directed. Take 1 hr before dental appt   apraclonidine (IOPIDINE) 0.5 % ophthalmic solution Place 1 drop into the right eye daily as needed (for droopy eye).    ASPIRIN LOW DOSE 81 MG chewable tablet Chew 81 mg by mouth daily.    celecoxib (CELEBREX) 200 MG capsule celecoxib 200 mg capsule  TAKE 1 CAPSULE BY MOUTH EVERY DAY   Cholecalciferol (VITAMIN D3) 50 MCG (2000 UT) TABS Take 2,000 Units by mouth daily.   clopidogrel (PLAVIX) 75 MG tablet TAKE 1 TABLET(75 MG) BY MOUTH DAILY. FOLLOW UP APPOINTMENT NEEDED   colchicine 0.6 MG tablet TAKE 1  TABLET BY MOUTH EVERY DAY OR AS NEEDED   cyanocobalamin (,VITAMIN B-12,) 1000 MCG/ML injection Inject 1,000 mcg into the muscle every 30 (thirty) days.   cyclobenzaprine (FLEXERIL) 10 MG tablet Take 1 tablet (10 mg total) by mouth 3 (three) times daily as needed for muscle spasms.   docusate sodium (COLACE) 100 MG capsule Take 1 capsule (100 mg total) by mouth 2 (two) times daily.   ezetimibe (ZETIA) 10 MG tablet TAKE 1 TABLET(10 MG) BY MOUTH DAILY   fluticasone (FLONASE) 50 MCG/ACT nasal spray Place 1 spray into both nostrils daily.   furosemide (LASIX) 40 MG tablet Take 40 mg by mouth daily as needed for fluid.  lisinopril (ZESTRIL) 10 MG tablet TAKE 1 TABLET(10 MG) BY MOUTH DAILY   metoprolol succinate (TOPROL-XL) 25 MG 24 hr tablet TAKE 1 TABLET(25 MG) BY MOUTH DAILY   Multiple Vitamin (MULTI-VITAMIN) tablet Take by mouth.   nitroGLYCERIN (NITROSTAT) 0.4 MG SL tablet PLACE 1 TABLET UNDER THE TONGUE EVERY 5 MINUTES AS NEEDED FOR CHEST PAIN WITH A MAX OF 3 DOSES   NONFORMULARY OR COMPOUNDED ITEM Vitamin E vaginal cream 200u/ml.  One ml pv three times weekly.   pantoprazole (PROTONIX) 40 MG tablet TAKE 1 TABLET(40 MG) BY MOUTH DAILY   phentermine (ADIPEX-P) 37.5 MG tablet Take 1 tablet by mouth as needed.   rosuvastatin (CRESTOR) 20 MG tablet TAKE 1 TABLET(20 MG) BY MOUTH DAILY   sertraline (ZOLOFT) 100 MG tablet Take 1 tablet by mouth daily.   testosterone cypionate (DEPOTESTOSTERONE CYPIONATE) 200 MG/ML injection INJECT 1 ML INTO THE MUSCLE EVERY 28 DAYS. PATIENT IS DUE FOR APPOINTMENT     Review of Systems      All other systems reviewed and are otherwise negative except as noted above.  Physical Exam    VS:  BP 130/80   Pulse (!) 57   Ht 5' 1.5" (1.562 m)   Wt 146 lb 12.8 oz (66.6 kg)   LMP 08/17/1977 (Approximate)   SpO2 95%   BMI 27.29 kg/m  , BMI Body mass index is 27.29 kg/m.  Wt Readings from Last 3 Encounters:  06/02/21 146 lb 12.8 oz (66.6 kg)  04/02/21 140 lb (63.5  kg)  03/28/21 141 lb 1.6 oz (64 kg)    GEN: Well nourished, well developed, in no acute distress. HEENT: normal. Neck: Supple, no JVD, carotid bruits, or masses. Cardiac: RRR, no murmurs, rubs, or gallops. No clubbing, cyanosis, edema.  Radials/PT 2+ and equal bilaterally.  Respiratory:  Respirations regular and unlabored, clear to auscultation bilaterally. GI: Soft, nontender, nondistended. MS: No deformity or atrophy. Skin: Warm and dry, no rash. Neuro:  Strength and sensation are intact. Psych: Normal affect.  Assessment & Plan    CAD - Stable with no anginal symptoms. No indication for ischemic evaluation.  GDMT includes Plavix, Rosuvastatin, Metoprolol. Refills provided. Heart healthy diet and regular cardiovascular exercise encouraged.    HLD, LDL goal <70 - Continue Rosuvastatin.   HTN - BP well controlled. Continue current antihypertensive regimen. Refills provided.  Some episodes of orthostatic hypotension - discussed remaining well hydrated, compression stockings, making position changes slowly.  Disposition: Follow up in 1 year(s) with Dr. Burt Knack or APP.  Signed, Loel Dubonnet, NP 06/02/2021, 11:40 AM [place]

## 2021-06-05 ENCOUNTER — Telehealth: Payer: Self-pay | Admitting: Neurology

## 2021-06-05 NOTE — Telephone Encounter (Signed)
LVM & sent mychart msg informing pt of date change- MD in meeting.

## 2021-06-12 ENCOUNTER — Encounter: Payer: Self-pay | Admitting: Neurology

## 2021-06-12 ENCOUNTER — Ambulatory Visit (INDEPENDENT_AMBULATORY_CARE_PROVIDER_SITE_OTHER): Payer: Medicare Other | Admitting: Neurology

## 2021-06-12 VITALS — BP 111/67 | HR 68 | Ht 61.5 in | Wt 143.0 lb

## 2021-06-12 DIAGNOSIS — R2681 Unsteadiness on feet: Secondary | ICD-10-CM | POA: Diagnosis not present

## 2021-06-12 DIAGNOSIS — G4739 Other sleep apnea: Secondary | ICD-10-CM

## 2021-06-12 DIAGNOSIS — I2510 Atherosclerotic heart disease of native coronary artery without angina pectoris: Secondary | ICD-10-CM

## 2021-06-12 DIAGNOSIS — G4731 Primary central sleep apnea: Secondary | ICD-10-CM | POA: Diagnosis not present

## 2021-06-12 DIAGNOSIS — G4752 REM sleep behavior disorder: Secondary | ICD-10-CM | POA: Diagnosis not present

## 2021-06-12 NOTE — Progress Notes (Signed)
SLEEP MEDICINE CLINIC   Provider:  Larey Seat, M D  Referring Provider: [name], MD Primary Care Physician:  [name], MD  Chief Complaint  Patient presents with   Follow-up    RM 10, alone. Followup for cpap. Download from care orchestrator only shows  data from [facility]. Pt forgot machine today.  She had to use oxygen while in the [employer], and  exercise. Changed from autopap to cpap pressure setting 13 at last visit. She states she has been using machine nightly.    Interval history : 06-12-2021:  Central apnea patient. Can't stay in high altitude without supplemental oxygen for 24/ 7 now.  Brain MRI- Dr Ardeth Perfect,  mild orthostatic dizziness. Vertigo is much improved, mainly through [place] maneuvers.  Ataxia?  Balance remains poor. She may walk and suddenly veer off, goes 'off piste'. I like for her to use a cane.  We repeated today a brief gait examination Mrs. Marius Ditch gait up and down the hallway was very steady.  She reports that she had trouble with her not yet replaced left knee , much pain and stiffness - She feels improved by a CBC ointment.   Endurance is poorer since she contracted Covid July 4th. She felt very myalgic and fatigued.  Slowly improving. Today is a good day.   The patient had undergone a split-night polysomnography in May 2022 and I felt that she is still at severe and complex apnea she was treated with CPAP and [place].  She did have PVCs and her single heart electrode, she will continue using CPAP autotitration 5 through 10 cmH2O 1 cm EPR and she uses a nasal mask and small size.      IMPRESSION: 03-24-2021:  1. No acute intracranial abnormality. 2. Periventricular and subcortical T2 hyperintensities bilaterally are moderately advanced for age. The finding is nonspecific but can be seen in the setting of chronic microvascular ischemia, a demyelinating process such as multiple sclerosis,  vasculitis, complicated migraine headaches, or as the sequelae of a prior infectious or inflammatory process.    Rv , post seep study from 01-09-2021,POLYSOMNOGRAPHY IMPRESSION :    1.        SEVERE and Complex Sleep Apnea (CSA) was treated with CPAP in this CPAP dependent patient, who [place]  2.        Fragmented sleep, many spontaneous arousals from sleep 3.        Many PVCs in single channel EKG.  4.        NO REM sleep was recorded.  5.        No additional  oxygen was needed for sleep at this altitude.    RECOMMENDATIONS: Continue CPAP use with autotitration device at a range from 5 cm water  through 10 cm water , 1 cm EPR, under heated humidification and nasal N 20 mask in small size.           Interval history 12-09-2020, needing a new machine , now [place]. She has bruxism, and is damaging her teeth, effexor was exchanged to zoloft to prevent some of this. She needs a mouth guard now in daytime- stress symptom? More balance problems- not affecting her golf game. Mostly unsteady on stairs,  Feels she fall s when looking down. TANARA TURVEY is a 75 y.o. female , seen here in a RV presents today for follow up.Marland Kitchen  Discussing she is eligible for a new machine 12/2020.  Since the patient has a history of complex sleep apnea and her current AHI is 5.4 without major air leakage and was a device pressure of 90% of the time of 13 cmH2O I would very much like for her to continue using this machine in spite of being on recall.  I ordering a home sleep test to confirm that her current settings are fine with a minimum pressure of 5 maximum pressure of 13 cmH2O to centimeter EPR heated humidity starting pressure is 4 cmH2O ramp time is only 5 minutes.  I reviewed the medication list, the Epworth Sleepiness Scale was endorsed at 5 points while on treatment.  She is not sleeping at night without it and when she spends time at her [facility]  home she is usually on oxygen at night but for the first time this winter she was asked to use it when she exercises or even walks in daytime she became so severely short of breath.     06-03-2020:  MEGGAN DHALIWAL is a 75 y.o. female , seen here in a RV  rm 11 presents today for follow up. states overall things are well. she asked about a new machine. in discussing she is eligible for a new machine 12/2020. however currently her machine is a respironics and may be a part of a recall as she does use a ozone cleaning device. RN informed her about registrering her device. DME [facility].  I have the pleasure of seeing Nelda Luckey today, a longstanding and established patient who has used positive airway pressure therapy for the treatment of complex sleep apnea.  Her sleep apnea complexity is related to underlying causes which we have listed in her past medical history.  She she would be due for new machine as of May 2022 her current C-Flex CPAP is set with a pressure window, does not produce a lot of air leaks, and she is using it 63.3% of the time compliantly.  Actually she has used the machine 26 out of 30 days.  Some nights with use at time is shortened.  She sometimes uses the connection to the machine at night.  Her average AHI is 6.5 which has been a little higher than last year.  She is using a humidifier, she has a ramp time of 5 minutes and starts at 4 cmH2O with a maximum pressure option of 12 cm of water.  Her 95th percentile pressure used is at 12 cmH2O so she is maxing out on the upper limits.  I would rather until May increase her current pressure by 1 cm and will order a repeat sleep study which can be a home sleep test for her on or about April so that we are ready with a new baseline.   I like to have her tested before she needs the new machine.     Originally as a referral from Dr. Ardeth Perfect -  She fell out of bed, had to get stitches in the ED- she has more and more dream enactment,  often nightmares, and not leaving the bed. She had vivd dreams since childhood, and her grandson has night terrors. She also hears music when there is none, but only when a mechanical noise is in the background.  She never had tinnitus, but a sound as if a radio produces  static sounds was perceived.  On December10th 2019 she had a knee surgery, and the pain medications was  giving her horrible delusions- she was terrified. Complex visual hallucinations - these were visions of people, snakes, no voices. People that looked into the window. Delirium.  She called her MD and the police, and her husband was helpfess.   She is OK with her apnea treatment. This year has been quiet , no travels due to Covid 19, and she enjoyed being home - sad only because she missed a family re-union.  Her son died in 05/20/23- of substance abuse.   rthostatics : laying down her blood pressure was 122/67 with a heart rate of 56,  seated blood pressure was 112% over 73 mmHg with a heart rate of 61 this was only arise by 5 beats per minutes is a little less than I would expect .  her standing blood pressure was 120/65 mmHg and her heart rate rose only to another 3 bpm to 64 bpm.    After 3 minutes of standing blood pressure was stabilized at 125/67 with a heart rate of 63.    So I do think there is a quite significant delay in her heart rate and blood pressure adjusting to changes in posture still but that does not explain that she would have sudden bouts of balance problems.  She seems to fall to one side or feel weaker on one side.  I also do not see that correlated to her MRI necessarily.   Mrs. Finerty provided me with a high compliance record 93.3% for the last 30 days prior to 29 May 2019.  Average use a time for all days 7 hours 55 minutes, mean pressure of CPAP 9.1 average device pressure 11.4 average time in large leak 17 minutes average AHI 6.4.  As it is a 6.4 is related to the leak time however we do not need  to change settings.   First visit upon referral with Dr Ardeth Perfect. Chief complaint according to patient : " re evaluate my apnea and care." The patient reports that she moved to the [place] area about 3 years ago and still gets supplies for her established CPAP per Mail. She was diagnosed with obstructive sleep apnea in [place], in [place]. [facility] posted the sleep center. The patient was diagnosed on 05/02/2008 the study revealed an AHI of 23 was mild decreases in oxygen level as well as continuous snoring. Then CPAP was used for the second night of the split night polysomnography and her oxygen level improved to normal her snoring resolved and the breathing was totally regulated according to Dr. Shelton Silvas note. Concerning was that there was a ventricular tachycardia as of 27 beats. The patient was evaluated by cardiology afterwards the finding could never be reciprocated. She continued to use CPAP for the last 7 years compliantly but she is a little tired of using a nasal mask that has become uncomfortable and his pressure marks on her face. She is also not sure to what degree apnea still present at this time. The patient stays about 7 months of the year here in [place], and 5 months in [place]. Usually they travel from Thanksgiving to Easter. Her husband witnesses snoring when she naps, but not while on CPAP. Her insomnia improved drastically after CPAP.   Sleep habits are as follows: The patient goes to bed between 9.30 and 10 PM, usually falls asleep promptly. She prefers the left side to sleep , but wakes sometimes up on her back.  She learned to sleep on her back after breast cancer surgery (  diagnosed Aug 8th 2014 ). The bedroom is described as core, quiet and dark. The patient shares a bedroom with her husband. They're also 2 dogs in the bedroom on the bed. She rises usually once to go to the bathroom sometimes twice. She can fall asleep again fairly promptly. She  is not a restless sleeper not kicking or moving or not excessively at night. She rises in the morning at 5 sometimes even 8. She usually wakes spontaneously not based on an alarm setting. She averages 7-8 hours of nocturnal sleep.  She reports vivid dreams, "repetitive and in full color".  Some mornings she feels refreshed and restored but not all the time. She is still recovering from her breast reconstruction surgery. She endorsed some snoring, joint pain, aching muscles and even some dizziness. She also has right shoulder bursitis which limits her ability to sleep on the right. She naps 3 out of 7 days, once  daily, a nap will last  2 hours unless she sets an alarm.   Mrs. Shepardson this past medical history includes gout, hyperlipidemia, hypertension, palpitations probably due to nonsustained ventricular tachycardia. In addition breast cancer of which her mother maternal aunt maternal grandmother and maternal great-grandmother were also affected. She is a history of allergic rhinitis her sister died in 10-31-11 of ALS. Sleep medical history and family sleep history: Her father was diagnosed with OSA never used CPAP ,  had an MI at age 66. Social history: non smoker, quit at age 77. ETOH , 1-2 at night. Caffeine use : none. Decaffeinated tea and coffee. Average and regular golf player. She likes winter sports, especially skiing but uses the left dangerous slope these days.  Interval history from 05-20-15. Hearing has been willing to undergo a new sleep study and attended nocturnal polysomnography on 04-09-15. She was diagnosed with a very mild AHI of 5.2 , supine sleep  AHI became 12.4  and it was further evident that the patient did not get into REM sleep. For this reason I feel it is much more important that the patient stays on CPAP given that her apnea was mild and she was fitted with a new mask. The new mass does not cause pressure marks around her nose and does not entangle her hair. She is using a dream wear  interface, which [facility] had to explicitly order for her.  Interval history from 12/23/2015. Mrs. Rayle has happily returned from [place], I have the pleasure today to see her CPAP download which confirms and 96.7% compliance average user time 6 hours and 39 minutes of CPAP.The device is a C flex set at 8 cm water pressure with a ramp time of 30 minutes. The REM time is a little too long and we will reduce it to 10 minutes. Her husband has made her aware that she still snores when she doesn't use a CPAP. She has become more compliant she endorsed only 2 points today on the geriatric depression store, 7 points on the Epworth sleepiness score and the fatigue severity at 29.  Interval history from 02/26/2016. [name] is here today relaxed after a week vacation. She reports sleeping so much better with the new CPAP machine. Her sleep is deeper but she does not recall having nightmares she has fallen out of bed 1 night it was actually the third night she used the new CPAP. She feels her sleep is less fragmented, more sound, and more refreshing and restorative. Her download revealed 100% compliance over the last 30  days was 97% compliance 4 hours of use, average user time is 7 hours and 41 minutes. This is excellent compliance with CPAP is set at 8 cm water we did  use a 10 minute ramp function. Her snoring has of course been alleviated using CPAP, which pleases her husband. She has a residual AHI of 8.1 but given her decrease and sleepiness/ fatigue I would not want to change the settings.  Interval history from 12/21/2016, [name] has been to winter in [place] and had a additional medication in [place]. She is now doing well but during her winter stay in [place] she developed shortness of breath and apparently more severe hypoxemia and apnea. She saw a pulmonologist at the location would tested her oxygen levels while walking and also diagnosed her with type a flu. She had been coughing for over 6  weeks when the flu finally converted to bronchitis and then resolved. She remained air hungry and short of breath for much of her winter time. I'm able to state today that she feels back to her baseline. But we need to discuss how we could prevent altitude related apneas which can often be central in nature and if she should use an AutoPap with an automatic pressure window. The patient is a very highly compliant CPAP user her compliance rate was 93.3% with an average user time of 7 hours and 6 minutes. She has some air leaks but not significant once CPAP is set at 8 cm water pressure with a residual AHI of 17.4. She has a short ramp time. Most of the AHI is related to shallow breathing. Her average central apnea index is 1.1 and obstructive apnea index is 3.3. Our goal is is to have an AHI of below 5. I will ask [facility] to change her to an auto titration from 5-10 cm water. She continues to use a dream wear interface. She would like the non-prong version of the dream wear interface. We will also meet again in late September or October to discuss if she needs additional oxygen at night during her stays at high altitudes.  05-24-2017, Mrs. Salvadore Dom is planning again to spend the winter in [place]. She asked me to make sure that she will have oxygen available for nightly use in the upper inner air. Last year she stated she was miserable on CPAP alone. She is a very compliant CPAP use of his 100% compliance and an average user time of 9 hours and 2 minutes, uses the machine as an auto CPAP between 5 and 15 cm water, average pressure is 9.9 cm water and the average residual AHI is 5.1. The machine allows between 5 and 10 cm water pressure only, given that the patient does not have central apneas I will increase the upper limits to 12 cm water. I would also investigate is able care can help [place] with oxygen in [place] ( she knows an oxygen supplier in [place]- "[employer]" ) .   05-30-2018, The  patient suffered a heart attack in January of this year, and had a rough year- needs bilateral knee replacements. Her step- son died 84 days ago at age 34 years , with alcoholism and bipolar disease, died of multi organ -liver failure. She brought her CPAP machine - and would like to change her interface .she wants to return to a pillairo or nasal pillow. Her machine was issued in  August 2016- after a AHI of only 5.2- but loud snoring and RERAS. UARS. Residual  AHI was 7/h at 90% pressure of 10.2 cm water.  Compliance was 87% for time, and 26/ 30 days.    Review of Systems: Out of a complete 14 system review, the patient complains of only the following symptoms, and all other reviewed systems are negative.Snoring. Grieving - lost mother and sister to breast cancer in a short time and was diagnosed with breast cancer herself 3.5 years ago.  On antihormone therapy. She takes statins.  Epworth score 7/24  , Fatigue severity score again at  41/ 63, geriatric depression score 2 ,     her machine is 33.76  years old .  AHI increased . Nasal pillow- dream wear causes hair trouble- will switch back to a nasal pillow.  Settings are AUTO on a dream station, settings are at 5-12 with 2 cm water EPR. I increased to 13 cm.   How likely are you to doze in the following situations: 0 = not likely, 1 = slight chance, 2 = moderate chance, 3 = high chance  Sitting and Reading? Watching Television? Sitting inactive in a public place (theater or meeting)? Lying down in the afternoon when circumstances permit? Sitting and talking to someone? Sitting quietly after lunch without alcohol? In a car, while stopped for a few minutes in traffic? As a passenger in a car for an hour without a break?  Total = 5 on auto PAP.   Bruxism  Shingles last winter, left neck and shoulder.     Social History   Socioeconomic History   Marital status: Married    Spouse name: Not on file   Number of children: 2   Years  of education: Not on file   Highest education level: Not on file  Occupational History   Not on file  Tobacco Use   Smoking status: Former    Packs/day: 1.00    Years: 20.00    Pack years: 20.00    Types: Cigarettes    Start date: 06/09/1992   Smokeless tobacco: Never   Tobacco comments:    quit at age 11  Vaping Use   Vaping Use: Never used  Substance and Sexual Activity   Alcohol use: Yes    Alcohol/week: 7.0 - 14.0 standard drinks    Types: 7 - 14 Glasses of wine per week   Drug use: No    Comment: quit 24 years ago   Sexual activity: Yes    Birth control/protection: Surgical  Other Topics Concern   Not on file  Social History Narrative   Not on file   Social Determinants of Health   Financial Resource Strain: Not on file  Food Insecurity: Not on file  Transportation Needs: Not on file  Physical Activity: Not on file  Stress: Not on file  Social Connections: Not on file  Intimate Partner Violence: Not on file    Family History  Problem Relation Age of Onset   Breast cancer Mother        possible inflammatory breast cancer   Heart attack Father    Heart disease Father    ALS Sister    Breast cancer Maternal Grandmother 65   Heart attack Maternal Grandfather    Heart attack Paternal Grandfather    Breast cancer Maternal Aunt        dx in her 59s   Breast cancer Other        maternal great grandmother; dx in her 1s    Past Medical History:  Diagnosis Date  Allergy    Anxiety    takes Ativan daily prn   Arthritis    Breast cancer ([facility]) 2014   ER+/PR+/HEr2-,    Bruises easily    Colon polyps    2 polyps by report   Diverticulosis    GERD (gastroesophageal reflux disease)    occasionally takes Nexium    Gout    takes Allopurinol daily and Colchicine daily prn;last attack 31yr ago   Gout    History of bladder infections    many yrs ago   History of bronchitis    last time at least 824yrago   History of stress incontinence    Hyperlipidemia     takes Pravastatin daily   Insomnia    takes Ambien nightly prn   [place]   OSA (obstructive sleep apnea)    on CPAP   Osteoarthritis    Peripheral edema    takes Furosemide daily prn   PONV (postoperative nausea and vomiting)    Postmenopausal hormone therapy    Radiation 07/27/13-09/07/13   Right Breast x 31 treatments   Rhinitis    uses Flonase prn   Sinus congestion    Status post breast reconstruction 10/15/14   Bilateral implant removal and DIEP performed in [place], [place]   Tachycardia    takes Metoprolol daily   Ventricular tachycardia, non-sustained    during sleep study 2009 with normal cardiac workup    Past Surgical History:  Procedure Laterality Date   ABDOMINAL HYSTERECTOMY     with BSO   APPENDECTOMY     AXILLARY SENTINEL NODE BIOPSY Right 05/23/2013   Procedure: AXILLARY SENTINEL NODE BIOPSY;  Surgeon: ToOdis HollingsheadMD;  [facility];  Laterality: Right;  nuc med injection 7:00   BREAST RECONSTRUCTION WITH PLACEMENT OF TISSUE EXPANDER AND FLEX HD (ACELLULAR HYDRATED DERMIS) Bilateral 05/23/2013   Procedure: BILATERAL BREAST RECONSTRUCTION WITH PLACEMENT OF TISSUE EXPANDER AND FLEX HD;  Surgeon: DaCrissie ReeseMD;  [facility];  Laterality: Bilateral;   CARDIAC CATHETERIZATION  1999   CATARACT EXTRACTION, BILATERAL Bilateral    CHOLECYSTECTOMY     COSMETIC SURGERY     OTHER SURGICAL HISTORY  09/2017   had cyst removal from spine    PERCUTANEOUS CORONARY STENT INTERVENTION (PCI-S)  08/2017   [place] Bilateral 05/17/2014   Procedure: REMOVAL OF BILATERAL TISSUE EXPANDERS WITH PLACEMENT OF BILATERAL BREAST IMPLANTS FOR RECONSTRUCTION;  Surgeon: DaCrissie ReeseMD;  [facility];  Laterality: Bilateral;   TONSILLECTOMY     TOTAL KNEE ARTHROPLASTY Right  07/26/2018   Procedure: TOTAL KNEE ARTHROPLASTY;  Surgeon: OlParalee CancelMD;  Location: [facility];  Service: Orthopedics;  Laterality: Right;  70 mins   TOTAL MASTECTOMY Bilateral 05/23/2013   Procedure: TOTAL MASTECTOMY;  Surgeon: ToOdis HollingsheadMD;  [facility];  Laterality: Bilateral;    Current Outpatient Medications  Medication Sig Dispense Refill   allopurinol (ZYLOPRIM) 100 MG tablet TAKE 1 TABLET BY MOUTH ONCE DAILY 90 tablet 0   amoxicillin (AMOXIL) 500 MG tablet Take 4 tablets by mouth as directed. Take 1 hr before dental appt     apraclonidine (IOPIDINE) 0.5 % ophthalmic solution Place 1 drop into the right eye daily as needed (for droopy eye).   2   ASPIRIN LOW DOSE  81 MG chewable tablet Chew 81 mg by mouth daily.   0   celecoxib (CELEBREX) 200 MG capsule celecoxib 200 mg capsule  TAKE 1 CAPSULE BY MOUTH EVERY DAY     Cholecalciferol (VITAMIN D3) 50 MCG (2000 UT) TABS Take 2,000 Units by mouth daily.     clopidogrel (PLAVIX) 75 MG tablet Take 1 tablet (75 mg total) by mouth daily. 90 tablet 3   colchicine 0.6 MG tablet TAKE 1 TABLET BY MOUTH EVERY DAY OR AS NEEDED 30 tablet 0   cyanocobalamin (,VITAMIN B-12,) 1000 MCG/ML injection Inject 1,000 mcg into the muscle every 30 (thirty) days.     cyclobenzaprine (FLEXERIL) 10 MG tablet Take 1 tablet (10 mg total) by mouth 3 (three) times daily as needed for muscle spasms. 90 tablet 3   docusate sodium (COLACE) 100 MG capsule Take 1 capsule (100 mg total) by mouth 2 (two) times daily. 10 capsule 0   ezetimibe (ZETIA) 10 MG tablet TAKE 1 TABLET(10 MG) BY MOUTH DAILY 90 tablet 3   fluticasone (FLONASE) 50 MCG/ACT nasal spray Place 1 spray into both nostrils daily.     furosemide (LASIX) 40 MG tablet Take 40 mg by mouth daily as needed for fluid.      lisinopril (ZESTRIL) 10 MG tablet TAKE 1 TABLET(10 MG) BY MOUTH DAILY 90 tablet 3   metoprolol succinate (TOPROL-XL) 25 MG 24 hr tablet TAKE 1 TABLET(25 MG) BY MOUTH  DAILY 90 tablet 3   Multiple Vitamin (MULTI-VITAMIN) tablet Take by mouth.     nitroGLYCERIN (NITROSTAT) 0.4 MG SL tablet Place 1 tablet (0.4 mg total) under the tongue every 5 (five) minutes as needed for chest pain. 25 tablet 3   NONFORMULARY OR COMPOUNDED ITEM Vitamin E vaginal cream 200u/ml.  One ml pv three times weekly. 36 each 1   pantoprazole (PROTONIX) 40 MG tablet Take 1 tablet (40 mg total) by mouth daily. 90 tablet 3   phentermine (ADIPEX-P) 37.5 MG tablet Take 1 tablet by mouth as needed.     rosuvastatin (CRESTOR) 20 MG tablet Take 1 tablet (20 mg total) by mouth daily. 90 tablet 3   sertraline (ZOLOFT) 100 MG tablet Take 1 tablet by mouth daily.     testosterone cypionate (DEPOTESTOSTERONE CYPIONATE) 200 MG/ML injection INJECT 1 ML INTO THE MUSCLE EVERY 28 DAYS. PATIENT IS DUE FOR APPOINTMENT     No current facility-administered medications for this visit.    Allergies as of 06/12/2021 - Review Complete 06/12/2021  Allergen Reaction Noted   Pseudoeph-hydrocodone-gg Other (See Comments) 02/03/2019   Ivp dye [iodinated diagnostic agents] Hives 06/09/2012   Sulfa antibiotics Swelling 06/09/2012   Tape Other (See Comments) 07/04/2018    Vitals: BP 111/67   Pulse 68   Ht 5' 1.5" (1.562 m)   Wt 143 lb (64.9 kg)   LMP 08/17/1977 (Approximate)   BMI 26.58 kg/m  Last Weight:  Wt Readings from Last 1 Encounters:  06/12/21 143 lb (64.9 kg)   ZYS:AYTK mass index is 26.58 kg/m.     Last Height:   Ht Readings from Last 1 Encounters:  06/12/21 5' 1.5" (1.562 m)    Physical exam: hoarse voice. Looking well and well groomed.  No loss of smell or taste- never had covid 19, had 2 vaccines and a booster.  General: The patient is awake, alert and appears not in acute distress.  The patient is well groomed. She reports sweating a lot.  Head: Normocephalic, atraumatic. Neck is supple. Mallampati 4,  neck circumference: 15.5 Nasal airflow unrestricted, TMJ is now evident. Jaw  clencher.  Retrognathia is not seen.  She has a crowded lower jaw and she has bruxism marks. Wore a dental device. . Cardiovascular:  Regular rate and rhythm , without  murmurs or carotid bruit, and without distended neck veins. Respiratory: Lungs are clear to auscultation. Skin:  Without evidence of edema, or rashTrunk: BMI is 28. The patient's posture is erect.   Neurologic exam :Speech is fluent,  without dysarthria, mild dysphonia but no aphasia.  Mood and affect are appropriate.  Cranial nerves:Pupils are equal and briskly reactive to light.  Extraocular movement are intact. Hearing to finger rub intact.  Facial sensation intact to fine touch. Facial motor strength is symmetric and tongue and uvula moves midline.  Shoulder shrug was symmetrical.  Muscle tone smooth, but I feel no cogwheeling, bilaterally mild tremor, no ataxia.  Normal finger to nose.  Gait was stabile today.   Assessment and Plan:    I would certainly want her to have oxygen whenever at high altitude she did test negative for oxygen needs here at this level.  She is dancing again, but she reports dizziness. 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  Auto CPAP, 5-10 cm water.  She was highly compliant 100% for time and date of download obtained on 8-15 2022.  Her average pressure for CPAP is 9.9 cmH2O and she has not many air leaks.  Her residual apnea hypopnea index 7.0 the minimum pressure was 5 the maximum pressure of 10 cmH2O was 2 cm flex setting I think with mostly concerns for is that her machine has a 50-minute ramp and starts at 4 cmH2O I would like to take the ramp off.  I will also allow for a 12 cm maximum pressure setting.  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 Started flexaril 10 mg at night  time. This grinding is in daytime and night.   3) unsteady gait- we checked today gait speed and turns.  Good walking speed, turned with 3 steps today, no ataxia.   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 No tremor -Now some mild cogwheeling, may need balance and gait PT> had white matter disease on MRI.   I spent more than 30 minutes of face to face time with the patient. Greater than 50% of time was spent in counseling and coordination of care. We have discussed the diagnosis and differential and I answered the patient's questions.   6 month RV  with gait examination from now on.   06-12-2021;    Asencion Partridge Crystle Carelli MD  06/12/2021   CC: [name], [place],  [place] [address]

## 2021-06-17 ENCOUNTER — Ambulatory Visit: Payer: Medicare Other | Admitting: Neurology

## 2021-06-18 DIAGNOSIS — R350 Frequency of micturition: Secondary | ICD-10-CM | POA: Diagnosis not present

## 2021-07-01 DIAGNOSIS — Z23 Encounter for immunization: Secondary | ICD-10-CM | POA: Diagnosis not present

## 2021-07-07 ENCOUNTER — Ambulatory Visit: Payer: No Typology Code available for payment source | Admitting: Neurology

## 2021-07-15 DIAGNOSIS — R102 Pelvic and perineal pain: Secondary | ICD-10-CM | POA: Diagnosis not present

## 2021-07-18 DIAGNOSIS — R102 Pelvic and perineal pain: Secondary | ICD-10-CM | POA: Diagnosis not present

## 2021-07-18 DIAGNOSIS — M5136 Other intervertebral disc degeneration, lumbar region: Secondary | ICD-10-CM | POA: Diagnosis not present

## 2021-07-18 DIAGNOSIS — M4316 Spondylolisthesis, lumbar region: Secondary | ICD-10-CM | POA: Diagnosis not present

## 2021-07-18 DIAGNOSIS — Z9049 Acquired absence of other specified parts of digestive tract: Secondary | ICD-10-CM | POA: Diagnosis not present

## 2021-07-18 DIAGNOSIS — Z90710 Acquired absence of both cervix and uterus: Secondary | ICD-10-CM | POA: Diagnosis not present

## 2021-07-18 DIAGNOSIS — M5134 Other intervertebral disc degeneration, thoracic region: Secondary | ICD-10-CM | POA: Diagnosis not present

## 2021-07-24 DIAGNOSIS — M25512 Pain in left shoulder: Secondary | ICD-10-CM | POA: Diagnosis not present

## 2021-07-25 DIAGNOSIS — M25512 Pain in left shoulder: Secondary | ICD-10-CM | POA: Diagnosis not present

## 2021-07-25 DIAGNOSIS — M19012 Primary osteoarthritis, left shoulder: Secondary | ICD-10-CM | POA: Diagnosis not present

## 2021-07-28 DIAGNOSIS — M67912 Unspecified disorder of synovium and tendon, left shoulder: Secondary | ICD-10-CM | POA: Diagnosis not present

## 2021-07-29 DIAGNOSIS — D2261 Melanocytic nevi of right upper limb, including shoulder: Secondary | ICD-10-CM | POA: Diagnosis not present

## 2021-07-29 DIAGNOSIS — L570 Actinic keratosis: Secondary | ICD-10-CM | POA: Diagnosis not present

## 2021-07-29 DIAGNOSIS — L578 Other skin changes due to chronic exposure to nonionizing radiation: Secondary | ICD-10-CM | POA: Diagnosis not present

## 2021-07-30 DIAGNOSIS — M1712 Unilateral primary osteoarthritis, left knee: Secondary | ICD-10-CM | POA: Diagnosis not present

## 2021-08-06 DIAGNOSIS — N941 Unspecified dyspareunia: Secondary | ICD-10-CM | POA: Diagnosis not present

## 2021-08-06 DIAGNOSIS — N952 Postmenopausal atrophic vaginitis: Secondary | ICD-10-CM | POA: Diagnosis not present

## 2021-08-06 DIAGNOSIS — N898 Other specified noninflammatory disorders of vagina: Secondary | ICD-10-CM | POA: Diagnosis not present

## 2021-08-12 DIAGNOSIS — L988 Other specified disorders of the skin and subcutaneous tissue: Secondary | ICD-10-CM | POA: Diagnosis not present

## 2021-08-12 DIAGNOSIS — L659 Nonscarring hair loss, unspecified: Secondary | ICD-10-CM | POA: Diagnosis not present

## 2021-08-18 ENCOUNTER — Other Ambulatory Visit: Payer: Self-pay | Admitting: Cardiovascular Disease

## 2021-08-18 DIAGNOSIS — S5011XA Contusion of right forearm, initial encounter: Secondary | ICD-10-CM | POA: Diagnosis not present

## 2021-08-18 DIAGNOSIS — W19XXXA Unspecified fall, initial encounter: Secondary | ICD-10-CM | POA: Diagnosis not present

## 2021-08-18 DIAGNOSIS — S59911A Unspecified injury of right forearm, initial encounter: Secondary | ICD-10-CM | POA: Diagnosis not present

## 2021-08-18 DIAGNOSIS — K219 Gastro-esophageal reflux disease without esophagitis: Secondary | ICD-10-CM

## 2021-09-02 DIAGNOSIS — F411 Generalized anxiety disorder: Secondary | ICD-10-CM | POA: Diagnosis not present

## 2021-09-02 DIAGNOSIS — J069 Acute upper respiratory infection, unspecified: Secondary | ICD-10-CM | POA: Diagnosis not present

## 2021-09-05 ENCOUNTER — Other Ambulatory Visit: Payer: Self-pay | Admitting: Cardiovascular Disease

## 2021-09-12 ENCOUNTER — Other Ambulatory Visit: Payer: Self-pay | Admitting: Cardiovascular Disease

## 2021-10-15 DIAGNOSIS — M25562 Pain in left knee: Secondary | ICD-10-CM | POA: Diagnosis not present

## 2021-10-15 DIAGNOSIS — M1712 Unilateral primary osteoarthritis, left knee: Secondary | ICD-10-CM | POA: Diagnosis not present

## 2021-10-21 ENCOUNTER — Telehealth: Payer: Self-pay

## 2021-10-21 NOTE — Telephone Encounter (Signed)
? ?  Pre-operative Risk Assessment  ?  ?Patient Name: Tracey [name]  ?DOB: 06-06-1946 ?MRN: [id number] ? ? ?  ? ?Request for Surgical Clearance ? ?Procedure:   Left Total Knee ? ?Date of Surgery:  Clearance TBD                              ?  ? ?Surgeon:  Dr Paralee Cancel ?Surgeon's Group or Practice Name:  [facility] ?Phone number:  [phone] [name]) ?Fax number:  [phone] ? ?Type of Clearance Requested:   ?- Medical  ?- Pharmacy:  Hold Clopidogrel (Plavix) 7 days  ? ?Type of Anesthesia:   Spinal ? ?Additional requests/questions:   N/A ? ?Signed, ?Ulice Brilliant T   ?10/21/2021, 4:28 PM  ? ?

## 2021-10-21 NOTE — Telephone Encounter (Signed)
Tracey [name] 76 year old female is requesting preoperative cardiac evaluation for left total knee replacement.  She was last seen in the clinic on 06/02/2021.  She continued to do well at that time.  She denied chest pain, pressure, and tightness.  She was spending half of her time in [place] and the other half of her time in [place].  She did report that with living in [place] she required oxygen therapy.  She continued to play golf in her spare time.  She denied dizzy spells. ? ? ?Her PMH includes coronary artery disease, hyperlipidemia, breast cancer, osteoarthritis hypertension last seen 04/2020 by Dr. Burt Knack. ?  ?She presented with NSTEMI January 2019 while in [place].  She was treated with PCI of LAD with overlapping DES.  Follow-up nuclear stress test 10/2017, 05/2019, 1/02021 with no ischemia. ? ? ?May her Plavix be held prior to her surgery? ? ?Thank you for your help.  Please direct your response to CV DIV preop pool. ? ?Jossie Ng. Linday Rhodes NP-C ? ?  ?10/21/2021, 4:44 PM ?[place] ?[address] ?Office [phone] Fax [phone] ? ?

## 2021-10-23 NOTE — Telephone Encounter (Signed)
Per pre op provider Coletta Memos, FNP he did decide better to leave in person appt with Richardson Dopp, PAC. I will forward notes to [facility] for upcoming appt. Will send FYI to requesting office pt has appt 11/19/21 with Richardson Dopp, PAC.  ?

## 2021-10-23 NOTE — Telephone Encounter (Signed)
Preoperative team, please contact this patient and set up a phone call appointment for further cardiac evaluation.  Thank you for your help. ? ?Jossie Ng. Kaliana Albino NP-C ? ?  ?10/23/2021, 1:31 PM ?[place] ?[address] ?Office [phone] Fax [phone] ? ?

## 2021-10-23 NOTE — Telephone Encounter (Signed)
Yes ok to hold plavix prior to surgery as requested. Thank you ?

## 2021-11-11 ENCOUNTER — Ambulatory Visit: Payer: Medicare Other | Admitting: Cardiovascular Disease

## 2021-11-12 DIAGNOSIS — Z20822 Contact with and (suspected) exposure to covid-19: Secondary | ICD-10-CM | POA: Diagnosis not present

## 2021-11-18 ENCOUNTER — Encounter: Payer: Self-pay | Admitting: Physician Assistant

## 2021-11-18 DIAGNOSIS — Z01810 Encounter for preprocedural cardiovascular examination: Secondary | ICD-10-CM | POA: Insufficient documentation

## 2021-11-18 NOTE — Progress Notes (Signed)
?Cardiology Office Note:   ? ?Date:  11/19/2021  ? ?ID:  TACHE BOBST, DOB 20-Sep-1945, MRN [id number] ? ?PCP:  [name], MD  ?[facility] Heart[facility] Providers ?Cardiologist:  Sherren Mocha, MD ?Cardiology APP:  Liliane Shi, PA-C    ?Referring MD: [name], MD  ? ?Chief Complaint:  Surgical Clearance ?  ? ?Patient Profile: ?Coronary artery disease  ?NSTEMI in Jan 2019 (in [place], [facility]) s/p DES to LAD ?C/b stent thrombosis - required 2nd stent ?Intol to higher dose beta-blocker due to fatigue ?Hyperlipidemia  ?Hypertension  ?Chronic kidney disease  ?Sleep apnea  ? ?Prior CV Studies: ?Myoview 05/20/2020 ?EF 72, no infarct or ischemia, low risk ? ?Myoview 05/30/2019 ?EF 67, normal perfusion; low risk ? ?Echocardiogram 05/30/2019 ?EF 60-65, normal RVSF, trivial TR, GLS -21.5 ? ?Nuclear stress test [facility] 11/01/2017 ?EF 81, no ischemia, submaximal heart rate (76% predicted maximal heart rate) ?  ?Echocardiogram 09/15/2017 [facility] ?EF 60, anteroseptal and apical hypokinesis, mild LVH ?  ?Cardiac catheterization 09/16/2017 [facility] ?LM normal ?LAD/diagonal 95% ?LCx normal ?RCA normal ? ? ?History of Present Illness:   ?Tracey [name] is a 77 y.o. female with the above problem list.  She was last seen in October 2022 by Laurann Montana, NP.  She returns for surgical clearance.  She needs a left total knee replacement with Dr. Alvan Dame with spinal anesthesia.  She needs to hold Plavix for 7 days.  Her chart was reviewed by Dr. Burt Knack who has cleared her to hold Plavix for her surgery.  She is here alone.  She just returned from [place].  She typically has more issues with breathing at higher elevations.  She is fine when she is back in [place].  This has been going on for several years.  She has not had chest pain, significant shortness of breath, orthopnea, leg edema or syncope.  Despite her knee pain, she is still able to do regular activities.       ?   ?Past Medical History:  ?Diagnosis Date  ? Allergy   ? Anxiety   ? takes Ativan daily prn  ? Arthritis   ? Breast cancer ([place]) 2014  ? ER+/PR+/HEr2-,   ? Bruises easily   ? Colon polyps   ? 2 polyps by report  ? Coronary artery disease involving native coronary artery of native heart without angina pectoris 01/01/2014  ? NSTEMI in January 2019 in [place]>> s/p DES x2 to the LAD (procedure complicated by stent thrombosis) Myoview 05/2020: EF 72, no infarct or ischemia; low risk Echocardiogram 05/2019: EF 60-65, GLS -21.5 Cardiac catheterization at [facility] in 08/2017: Normal left main, normal LCx, normal RCA; LAD/diagonal 95%>> PCI   ? Diverticulosis   ? GERD (gastroesophageal reflux disease)   ? occasionally takes Nexium   ? Gout   ? takes Allopurinol daily and Colchicine daily prn;last attack 73yr ago  ? Gout   ? History of bladder infections   ? many yrs ago  ? History of bronchitis   ? last time at least 878yrago  ? History of stress incontinence   ? Hyperlipidemia   ? takes Pravastatin daily  ? Insomnia   ? takes Ambien nightly prn  ? [place]ST [place]  ? OSA (obstructive sleep apnea)   ? on CPAP  ? Osteoarthritis   ? Peripheral edema   ? takes Furosemide daily prn  ?  PONV (postoperative nausea and vomiting)   ? Postmenopausal hormone therapy   ? Radiation 07/27/13-09/07/13  ? Right Breast x 31 treatments  ? Rhinitis   ? uses Flonase prn  ? Sinus congestion   ? Status post breast reconstruction 10/15/14  ? Bilateral implant removal and DIEP performed in [place], [place]  ? Tachycardia   ? takes Metoprolol daily  ? Ventricular tachycardia, non-sustained (HCC)   ? during sleep study 2009 with normal cardiac workup  ? ?Current Medications: ?Current Meds  ?Medication Sig  ? allopurinol (ZYLOPRIM) 100 MG tablet TAKE 1 TABLET BY MOUTH ONCE DAILY  ? amoxicillin (AMOXIL) 500 MG tablet Take 4 tablets by mouth as directed. Take 1 hr before dental appt  ?  apraclonidine (IOPIDINE) 0.5 % ophthalmic solution Place 1 drop into the right eye daily as needed (for droopy eye).   ? ASPIRIN LOW DOSE 81 MG chewable tablet Chew 81 mg by mouth daily.   ? celecoxib (CELEBREX) 200 MG capsule celecoxib 200 mg capsule ? TAKE 1 CAPSULE BY MOUTH EVERY DAY  ? Cholecalciferol (VITAMIN D3) 50 MCG (2000 UT) TABS Take 2,000 Units by mouth daily.  ? clopidogrel (PLAVIX) 75 MG tablet Take 1 tablet (75 mg total) by mouth daily.  ? colchicine 0.6 MG tablet TAKE 1 TABLET BY MOUTH EVERY DAY OR AS NEEDED  ? cyanocobalamin (,VITAMIN B-12,) 1000 MCG/ML injection Inject 1,000 mcg into the muscle every 30 (thirty) days.  ? cyclobenzaprine (FLEXERIL) 10 MG tablet Take 1 tablet (10 mg total) by mouth 3 (three) times daily as needed for muscle spasms.  ? docusate sodium (COLACE) 100 MG capsule Take 1 capsule (100 mg total) by mouth 2 (two) times daily.  ? ezetimibe (ZETIA) 10 MG tablet TAKE 1 TABLET(10 MG) BY MOUTH DAILY  ? fluticasone (FLONASE) 50 MCG/ACT nasal spray Place 1 spray into both nostrils daily.  ? furosemide (LASIX) 40 MG tablet Take 40 mg by mouth daily as needed for fluid.   ? lisinopril (ZESTRIL) 10 MG tablet TAKE 1 TABLET(10 MG) BY MOUTH DAILY  ? metoprolol succinate (TOPROL-XL) 25 MG 24 hr tablet TAKE 1 TABLET(25 MG) BY MOUTH DAILY  ? Multiple Vitamin (MULTI-VITAMIN) tablet Take by mouth.  ? nitroGLYCERIN (NITROSTAT) 0.4 MG SL tablet Place 1 tablet (0.4 mg total) under the tongue every 5 (five) minutes as needed for chest pain.  ? NONFORMULARY OR COMPOUNDED ITEM Vitamin E vaginal cream 200u/ml.  One ml pv three times weekly.  ? pantoprazole (PROTONIX) 40 MG tablet TAKE 1 TABLET(40 MG) BY MOUTH DAILY  ? phentermine (ADIPEX-P) 37.5 MG tablet Take 1 tablet by mouth as needed.  ? rosuvastatin (CRESTOR) 20 MG tablet Take 1 tablet (20 mg total) by mouth daily.  ? sertraline (ZOLOFT) 100 MG tablet Take 1 tablet by mouth daily.  ? testosterone cypionate (DEPOTESTOSTERONE CYPIONATE) 200 MG/ML  injection INJECT 1 ML INTO THE MUSCLE EVERY 28 DAYS. PATIENT IS DUE FOR APPOINTMENT  ?  ?Allergies:   Pseudoeph-hydrocodone-gg, Ivp dye [iodinated contrast media], Sulfa antibiotics, and Tape  ? ?Social History  ? ?Tobacco Use  ? Smoking status: Former  ?  Packs/day: 1.00  ?  Years: 20.00  ?  Pack years: 20.00  ?  Types: Cigarettes  ?  Start date: 06/09/1992  ? Smokeless tobacco: Never  ? Tobacco comments:  ?  quit at age 72  ?Vaping Use  ? Vaping Use: Never used  ?Substance Use Topics  ? Alcohol use: Yes  ?  Alcohol/week: 7.0 -  14.0 standard drinks  ?  Types: 7 - 14 Glasses of wine per week  ? Drug use: No  ?  Comment: quit 24 years ago  ?  ?Family Hx: ?The patient's family history includes ALS in her sister; Breast cancer in her maternal aunt, mother, and another family member; Breast cancer (age of onset: 66) in her maternal grandmother; Heart attack in her father, maternal grandfather, and paternal grandfather; Heart disease in her father. ? ?Review of Systems  ?Musculoskeletal:  Positive for joint pain.   ? ?EKGs/Labs/Other Test Reviewed:   ? ?EKG:  EKG is   ordered today.  The ekg ordered today demonstrates NSR, HR 75, normal axis, low voltage, no ST-T wave changes, QTc 388 ? ?Recent Labs: ?No results found for requested labs within last 8760 hours.  ? ?Recent Lipid Panel ?No results for input(s): CHOL, TRIG, HDL, VLDL, LDLCALC, LDLDIRECT in the last 8760 hours.  ? ?Risk Assessment/Calculations:   ?  ?    ?Physical Exam:   ? ?VS:  BP 140/82   Pulse 75   Ht 5' 1.5" (1.562 m)   Wt 147 lb 9.6 oz (67 kg)   LMP 08/17/1977 (Approximate)   SpO2 98%   BMI 27.44 kg/m?    ? ?Wt Readings from Last 3 Encounters:  ?11/19/21 147 lb 9.6 oz (67 kg)  ?06/12/21 143 lb (64.9 kg)  ?06/02/21 146 lb 12.8 oz (66.6 kg)  ?  ?Constitutional:   ?   Appearance: Healthy appearance. Not in distress.  ?Neck:  ?   Thyroid: No thyromegaly.  ?   Vascular: No JVR. JVD normal.  ?Pulmonary:  ?   Effort: Pulmonary effort is normal.  ?    Breath sounds: No wheezing. No rales.  ?Cardiovascular:  ?   Normal rate. Regular rhythm. Normal S1. Normal S2.   ?   Murmurs: There is no murmur.  ?Edema: ?   Peripheral edema absent.  ?Abdominal:  ?   Palpations:

## 2021-11-19 ENCOUNTER — Encounter: Payer: Self-pay | Admitting: Physician Assistant

## 2021-11-19 ENCOUNTER — Ambulatory Visit (INDEPENDENT_AMBULATORY_CARE_PROVIDER_SITE_OTHER): Payer: Medicare Other | Admitting: Physician Assistant

## 2021-11-19 VITALS — BP 140/82 | HR 75 | Ht 61.5 in | Wt 147.6 lb

## 2021-11-19 DIAGNOSIS — I2510 Atherosclerotic heart disease of native coronary artery without angina pectoris: Secondary | ICD-10-CM

## 2021-11-19 DIAGNOSIS — I10 Essential (primary) hypertension: Secondary | ICD-10-CM | POA: Diagnosis not present

## 2021-11-19 DIAGNOSIS — E785 Hyperlipidemia, unspecified: Secondary | ICD-10-CM | POA: Diagnosis not present

## 2021-11-19 DIAGNOSIS — Z01810 Encounter for preprocedural cardiovascular examination: Secondary | ICD-10-CM | POA: Diagnosis not present

## 2021-11-19 NOTE — Assessment & Plan Note (Signed)
History of non-STEMI in January 2019 while in [place].  She was treated with DES x2 to the LAD.  Stress testing in October 2021 was low risk.  She is doing well without anginal symptoms.  Continue aspirin 81 mg daily, Plavix and milligrams daily, Zetia 10 mg daily, Toprol-XL 25 mg daily, Crestor 20 mg daily.  Routine follow-up in October 2023. ?

## 2021-11-19 NOTE — Assessment & Plan Note (Signed)
Repeat blood pressure was 122/74.  Blood pressures at home are optimal.  Continue lisinopril 10 mg daily, Toprol-XL 25 mg daily. ?

## 2021-11-19 NOTE — Assessment & Plan Note (Addendum)
Ms. Bittinger perioperative risk of a major cardiac event is 0.9% according to the Revised Cardiac Risk Index (RCRI).  Therefore, she is at low risk for perioperative complications.   Her functional capacity is good at 4.31 METs according to the Duke Activity Status Index (DASI). ?Recommendations: ?According to ACC/AHA guidelines, no further cardiovascular testing needed.  The patient may proceed to surgery at acceptable risk.   ?Antiplatelet and/or Anticoagulation Recommendations: ?The patient should remain on Aspirin without interruption.   ?Clopidogrel (Plavix) can be held for 7 days prior to her surgery and resumed as soon as possible post op. ?

## 2021-11-19 NOTE — Assessment & Plan Note (Signed)
Labs obtained through [facility] - personally reviewed and interpreted: ?03/10/2021: Total cholesterol 129, HDL 50, LDL 61, triglycerides 90, ALT 22. ?LDL optimal.  Continue Crestor 20 mg daily, Zetia 10 mg daily.  ?

## 2021-11-19 NOTE — Patient Instructions (Addendum)
Medication Instructions:  ?Your physician recommends that you continue on your current medications as directed. Please refer to the Current Medication list given to you today. ? ?*If you need a refill on your cardiac medications before your next appointment, please call your pharmacy* ? ? ?Lab Work: ?None ordered ? ?If you have labs (blood work) drawn today and your tests are completely normal, you will receive your results only by: ?MyChart Message (if you have MyChart) OR ?A paper copy in the mail ?If you have any lab test that is abnormal or we need to change your treatment, we will call you to review the results. ? ? ?Testing/Procedures: ?None ordered ? ? ?Follow-Up: ?At [facility], you and your health needs are our priority.  As part of our continuing mission to provide you with exceptional heart care, we have created designated Provider Care Teams.  These Care Teams include your primary Cardiologist (physician) and Advanced Practice Providers (APPs -  Physician Assistants and Nurse Practitioners) who all work together to provide you with the care you need, when you need it. ? ?We recommend signing up for the patient portal called "MyChart".  Sign up information is provided on this After Visit Summary.  MyChart is used to connect with patients for Virtual Visits (Telemedicine).  Patients are able to view lab/test results, encounter notes, upcoming appointments, etc.  Non-urgent messages can be sent to your provider as well.   ?To learn more about what you can do with MyChart, go to [email or web address].   ? ?Your next appointment:   ?6 month(s) ? ?The format for your next appointment:   ?In Person ? ?Provider:   ?Sherren Mocha, MD  or Richardson Dopp, PA-C       ? ? ?Other Instructions ?It's ok to hold your Plavix 7 days prior to your surgery   Your surgeon's office will instruct you as to when to hold and when to restart ?

## 2021-11-20 NOTE — Telephone Encounter (Signed)
Notes faxed to surgeon. ?Richardson Dopp, PA-C    ?11/20/2021 9:59 AM   ?

## 2021-11-24 DIAGNOSIS — R0902 Hypoxemia: Secondary | ICD-10-CM | POA: Diagnosis not present

## 2021-11-24 DIAGNOSIS — R7302 Impaired glucose tolerance (oral): Secondary | ICD-10-CM | POA: Diagnosis not present

## 2021-11-24 DIAGNOSIS — N1832 Chronic kidney disease, stage 3b: Secondary | ICD-10-CM | POA: Diagnosis not present

## 2021-11-24 DIAGNOSIS — E785 Hyperlipidemia, unspecified: Secondary | ICD-10-CM | POA: Diagnosis not present

## 2021-11-24 DIAGNOSIS — K219 Gastro-esophageal reflux disease without esophagitis: Secondary | ICD-10-CM | POA: Diagnosis not present

## 2021-11-24 DIAGNOSIS — I119 Hypertensive heart disease without heart failure: Secondary | ICD-10-CM | POA: Diagnosis not present

## 2021-11-24 DIAGNOSIS — Z01818 Encounter for other preprocedural examination: Secondary | ICD-10-CM | POA: Diagnosis not present

## 2021-11-24 DIAGNOSIS — C50919 Malignant neoplasm of unspecified site of unspecified female breast: Secondary | ICD-10-CM | POA: Diagnosis not present

## 2021-11-24 DIAGNOSIS — G4733 Obstructive sleep apnea (adult) (pediatric): Secondary | ICD-10-CM | POA: Diagnosis not present

## 2021-11-24 DIAGNOSIS — M25562 Pain in left knee: Secondary | ICD-10-CM | POA: Diagnosis not present

## 2021-11-24 DIAGNOSIS — I2510 Atherosclerotic heart disease of native coronary artery without angina pectoris: Secondary | ICD-10-CM | POA: Diagnosis not present

## 2021-11-25 NOTE — Patient Instructions (Signed)
DUE TO COVID-19 ONLY ONE VISITOR  (aged 77 and older)  IS ALLOWED TO COME WITH YOU AND STAY IN THE WAITING ROOM ONLY DURING PRE OP AND PROCEDURE.   ?**NO VISITORS ARE ALLOWED IN THE SHORT STAY AREA OR RECOVERY ROOM!!** ? ?IF YOU WILL BE ADMITTED INTO THE HOSPITAL YOU ARE ALLOWED ONLY TWO SUPPORT PEOPLE DURING VISITATION HOURS ONLY (7 AM -8PM)   ?The support person(s) must pass our screening, gel in and out, and wear a mask at all times, including in the patient?s room. ?Patients must also wear a mask when staff or their support person are in the room. ?Visitors GUEST BADGE MUST BE WORN VISIBLY  ?One adult visitor may remain with you overnight and MUST be in the room by 8 P.M. ?  ? ? Your procedure is scheduled on: 12/02/21 ? ? Report to [facility] Main Entrance ? ?  Report to admitting at : 10:15 AM ? ? Call this number if you have problems the morning of surgery [phone] ? ? Do not eat food :After Midnight. ? ? After Midnight you may have the following liquids until: 10:00 AM DAY OF SURGERY ? ?Water ?Black Coffee (sugar ok, NO MILK/CREAM OR CREAMERS)  ?Tea (sugar ok, NO MILK/CREAM OR CREAMERS) regular and decaf                             ?Plain Jell-O (NO RED)                                           ?Fruit ices (not with fruit pulp, NO RED)                                     ?Popsicles (NO RED)                                                                  ?Juice: apple, WHITE grape, WHITE cranberry ?Sports drinks like Gatorade (NO RED) ?Clear broth(vegetable,chicken,beef) ?    ?Drink  Ensure drink AT 10:00 AM the day of surgery.   ?  ?The day of surgery:  ?Drink ONE (1) Pre-Surgery Clear Ensure or G2 at AM the morning of surgery. Drink in one sitting. Do not sip.  ?This drink was given to you during your hospital  ?pre-op appointment visit. ?Nothing else to drink after completing the  ?Pre-Surgery Clear Ensure or G2. ?  ?       If you have questions, please contact your surgeon?s office.  ?   ?Oral Hygiene is also important to reduce your risk of infection.                                    ?Remember - BRUSH YOUR TEETH THE MORNING OF SURGERY WITH YOUR REGULAR TOOTHPASTE ? ? Do NOT smoke after Midnight ? ? Take these medicines the morning of surgery with A SIP OF WATER: sertraline,loratadine,metoprolol,allopurinol,pantoprazole.Flonase and eye drops as  usual. ? ?DO NOT TAKE ANY ORAL DIABETIC MEDICATIONS DAY OF YOUR SURGERY ? ?Bring CPAP mask and tubing day of surgery. ?                  ?           You may not have any metal on your body including hair pins, jewelry, and body piercing ? ?           Do not wear make-up, lotions, powders, perfumes/cologne, or deodorant ? ?Do not wear nail polish including gel and S&S, artificial/acrylic nails, or any other type of covering on natural nails including finger and toenails. If you have artificial nails, gel coating, etc. that needs to be removed by a nail salon please have this removed prior to surgery or surgery may need to be canceled/ delayed if the surgeon/ anesthesia feels like they are unable to be safely monitored.  ? ?Do not shave  48 hours prior to surgery.  ? ? Do not bring valuables to the hospital. [facility] NOT ?            RESPONSIBLE   FOR VALUABLES. ? ? Contacts, dentures or bridgework may not be worn into surgery. ? ? Bring small overnight bag day of surgery. ?  ? Patients discharged on the day of surgery will not be allowed to drive home.  Someone NEEDS to stay with you for the first 24 hours after anesthesia. ? ? Special Instructions: Bring a copy of your healthcare power of attorney and living will documents         the day of surgery if you haven't scanned them before. ? ?            Please read over the following fact sheets you were given: IF Melissa [phone] ? ?   [facility] - Preparing for Surgery ?Before surgery, you can play an important role.  Because skin is not  sterile, your skin needs to be as free of germs as possible.  You can reduce the number of germs on your skin by washing with CHG (chlorahexidine gluconate) soap before surgery.  CHG is an antiseptic cleaner which kills germs and bonds with the skin to continue killing germs even after washing. ?Please DO NOT use if you have an allergy to CHG or antibacterial soaps.  If your skin becomes reddened/irritated stop using the CHG and inform your nurse when you arrive at Short Stay. ?Do not shave (including legs and underarms) for at least 48 hours prior to the first CHG shower.  You may shave your face/neck. ?Please follow these instructions carefully: ? 1.  Shower with CHG Soap the night before surgery and the  morning of Surgery. ? 2.  If you choose to wash your hair, wash your hair first as usual with your  normal  shampoo. ? 3.  After you shampoo, rinse your hair and body thoroughly to remove the  shampoo.                           4.  Use CHG as you would any other liquid soap.  You can apply chg directly  to the skin and wash  ?                     Gently with a scrungie or clean washcloth. ? 5.  Apply the  CHG Soap to your body ONLY FROM THE NECK DOWN.   Do not use on face/ open      ?                     Wound or open sores. Avoid contact with eyes, ears mouth and genitals (private parts).  ?                     Production manager,  Genitals (private parts) with your normal soap. ?            6.  Wash thoroughly, paying special attention to the area where your surgery  will be performed. ? 7.  Thoroughly rinse your body with warm water from the neck down. ? 8.  DO NOT shower/wash with your normal soap after using and rinsing off  the CHG Soap. ?               9.  Pat yourself dry with a clean towel. ?           10.  Wear clean pajamas. ?           11.  Place clean sheets on your bed the night of your first shower and do not  sleep with pets. ?Day of Surgery : ?Do not apply any lotions/deodorants the morning of surgery.   Please wear clean clothes to the hospital/surgery center. ? ?FAILURE TO FOLLOW THESE INSTRUCTIONS MAY RESULT IN THE CANCELLATION OF YOUR SURGERY ?PATIENT SIGNATURE_________________________________ ? ?NURSE SIGNATURE__________________________________ ? ?________________________________________________________________________  ? ?Incentive Spirometer ? ?An incentive spirometer is a tool that can help keep your lungs clear and active. This tool measures how well you are filling your lungs with each breath. Taking long deep breaths may help reverse or decrease the chance of developing breathing (pulmonary) problems (especially infection) following: ?A long period of time when you are unable to move or be active. ?BEFORE THE PROCEDURE  ?If the spirometer includes an indicator to show your best effort, your nurse or respiratory therapist will set it to a desired goal. ?If possible, sit up straight or lean slightly forward. Try not to slouch. ?Hold the incentive spirometer in an upright position. ?INSTRUCTIONS FOR USE  ?Sit on the edge of your bed if possible, or sit up as far as you can in bed or on a chair. ?Hold the incentive spirometer in an upright position. ?Breathe out normally. ?Place the mouthpiece in your mouth and seal your lips tightly around it. ?Breathe in slowly and as deeply as possible, raising the piston or the ball toward the top of the column. ?Hold your breath for 3-5 seconds or for as long as possible. Allow the piston or ball to fall to the bottom of the column. ?Remove the mouthpiece from your mouth and breathe out normally. ?Rest for a few seconds and repeat Steps 1 through 7 at least 10 times every 1-2 hours when you are awake. Take your time and take a few normal breaths between deep breaths. ?The spirometer may include an indicator to show your best effort. Use the indicator as a goal to work toward during each repetition. ?After each set of 10 deep breaths, practice coughing to be sure your  lungs are clear. If you have an incision (the cut made at the time of surgery), support your incision when coughing by placing a pillow or rolled up towels firmly against it. ?Once you are able to get out of bed,  wa

## 2021-11-26 ENCOUNTER — Encounter (HOSPITAL_COMMUNITY): Payer: Self-pay

## 2021-11-26 ENCOUNTER — Other Ambulatory Visit: Payer: Self-pay

## 2021-11-26 ENCOUNTER — Encounter (HOSPITAL_COMMUNITY)
Admission: RE | Admit: 2021-11-26 | Discharge: 2021-11-26 | Disposition: A | Discharge status: Home / Self Care | Payer: Medicare Other | Source: Ambulatory Visit | Attending: Orthopedic Surgery | Admitting: Orthopedic Surgery

## 2021-11-26 VITALS — BP 108/68 | HR 67 | Temp 98.2°F | Ht 61.5 in | Wt 147.7 lb

## 2021-11-26 DIAGNOSIS — N189 Chronic kidney disease, unspecified: Secondary | ICD-10-CM | POA: Diagnosis not present

## 2021-11-26 DIAGNOSIS — I129 Hypertensive chronic kidney disease with stage 1 through stage 4 chronic kidney disease, or unspecified chronic kidney disease: Secondary | ICD-10-CM | POA: Insufficient documentation

## 2021-11-26 DIAGNOSIS — Z01812 Encounter for preprocedural laboratory examination: Secondary | ICD-10-CM | POA: Diagnosis not present

## 2021-11-26 DIAGNOSIS — G4733 Obstructive sleep apnea (adult) (pediatric): Secondary | ICD-10-CM | POA: Diagnosis not present

## 2021-11-26 DIAGNOSIS — Z7902 Long term (current) use of antithrombotics/antiplatelets: Secondary | ICD-10-CM | POA: Insufficient documentation

## 2021-11-26 DIAGNOSIS — Z955 Presence of coronary angioplasty implant and graft: Secondary | ICD-10-CM | POA: Diagnosis not present

## 2021-11-26 DIAGNOSIS — I2510 Atherosclerotic heart disease of native coronary artery without angina pectoris: Secondary | ICD-10-CM | POA: Diagnosis not present

## 2021-11-26 DIAGNOSIS — M1712 Unilateral primary osteoarthritis, left knee: Secondary | ICD-10-CM | POA: Diagnosis not present

## 2021-11-26 DIAGNOSIS — Z01818 Encounter for other preprocedural examination: Secondary | ICD-10-CM

## 2021-11-26 LAB — CBC
HCT: 46.4 % — ABNORMAL HIGH (ref 36.0–46.0)
Hemoglobin: 14.7 g/dL (ref 12.0–15.0)
MCH: 30.9 pg (ref 26.0–34.0)
MCHC: 31.7 g/dL (ref 30.0–36.0)
MCV: 97.7 fL (ref 80.0–100.0)
Platelets: 219 10*3/uL (ref 150–400)
RBC: 4.75 MIL/uL (ref 3.87–5.11)
RDW: 14.3 % (ref 11.5–15.5)
WBC: 6.5 10*3/uL (ref 4.0–10.5)
nRBC: 0 % (ref 0.0–0.2)

## 2021-11-26 LAB — COMPREHENSIVE METABOLIC PANEL
ALT: 32 U/L (ref 0–44)
AST: 31 U/L (ref 15–41)
Albumin: 4.3 g/dL (ref 3.5–5.0)
Alkaline Phosphatase: 63 U/L (ref 38–126)
Anion gap: 10 (ref 5–15)
BUN: 32 mg/dL — ABNORMAL HIGH (ref 8–23)
CO2: 25 mmol/L (ref 22–32)
Calcium: 10 mg/dL (ref 8.9–10.3)
Chloride: 104 mmol/L (ref 98–111)
Creatinine, Ser: 1.38 mg/dL — ABNORMAL HIGH (ref 0.44–1.00)
GFR, Estimated: 39 mL/min — ABNORMAL LOW (ref >60)
Glucose, Bld: 88 mg/dL (ref 70–99)
Potassium: 4.9 mmol/L (ref 3.5–5.1)
Sodium: 139 mmol/L (ref 135–145)
Total Bilirubin: 0.6 mg/dL (ref 0.3–1.2)
Total Protein: 7.3 g/dL (ref 6.5–8.1)

## 2021-11-26 LAB — SURGICAL PCR SCREEN
MRSA, PCR: NEGATIVE
Staphylococcus aureus: NEGATIVE

## 2021-11-26 NOTE — Progress Notes (Signed)
For Short Stay: ?[facility] appointment date: ?Date of COVID positive in last 90 days: ?COVID Vaccine: Moderna x 3 ?Bowel Prep reminder: ? ? ?For Anesthesia: ?PCP - Dr. Doree Barthel ?Cardiologist - Dr. Sherren Mocha. Clearance: Richardson Dopp: 11/19/21: Epic. ? ?Chest x-ray -  ?EKG - 11/19/21 ?Stress Test -  ?ECHO - 05/30/19 ?Cardiac Cath - 09/16/17 ?Pacemaker/ICD device last checked: ?Pacemaker orders received: ?Device Rep notified: ? ?Spinal Cord Stimulator: ? ?Sleep Study - Yes ?CPAP - Yes ? ?Fasting Blood Sugar -  ?Checks Blood Sugar _____ times a day ?Date and result of last Hgb A1c- ? ?Blood Thinner Instructions:Plavix: will be held 7 days before surgery as per cardiologist. ?Aspirin Instructions: ?Last Dose: ? ?Activity level: Can go up a flight of stairs and activities of daily living without stopping and without chest pain and/or shortness of breath ?  Able to exercise without chest pain and/or shortness of breath ?  Unable to go up a flight of stairs without chest pain and/or shortness of breath ?   ? ?Anesthesia review: Hx: CAD,HTN,NSTEMI,OSA(CPAP),Tachycardia. ? ?Patient denies shortness of breath, fever, cough and chest pain at PAT appointment ? ? ?Patient verbalized understanding of instructions that were given to them at the PAT appointment. Patient was also instructed that they will need to review over the PAT instructions again at home before surgery.  ?

## 2021-11-27 NOTE — H&P (Signed)
TOTAL KNEE ADMISSION H&P ? ?Patient is being admitted for left total knee arthroplasty. ? ?Subjective: ? ?Chief Complaint:left knee pain. ? ?HPI: Tracey [name], 76 y.o. female, has a history of pain and functional disability in the left knee due to arthritis and has failed non-surgical conservative treatments for greater than 12 weeks to includeNSAID's and/or analgesics, corticosteriod injections, and activity modification.  Onset of symptoms was gradual, starting 2 years ago with gradually worsening course since that time. The patient noted no past surgery on the left knee(s).  Patient currently rates pain in the left knee(s) at 8 out of 10 with activity. Patient has worsening of pain with activity and weight bearing, pain that interferes with activities of daily living, and pain with passive range of motion.  Patient has evidence of joint space narrowing by imaging studies. There is no active infection. ? ?Patient Active Problem List  ? Diagnosis Date Noted  ? Preoperative cardiovascular examination 11/18/2021  ? 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  ? 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 CPAP (continuous positive airway pressure) dependence 01/20/2021  ? Unsteady gait when walking 01/20/2021  ? Complaint related to dreams 06/05/2019  ? Metabolic acidosis with increased anion gap and accumulation of organic acids 08/17/2018  ? Nausea, vomiting, and diarrhea 08/17/2018  ? AKI (acute kidney injury) ([place]) 08/17/2018  ? Overweight (BMI 25.0-29.9) 07/27/2018  ? 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 S/P right TKA 07/26/2018  ? S/P knee replacement 07/26/2018  ? UARS (upper airway resistance syndrome) 05/30/2018  ? Hyperlipidemia LDL goal <70 05/02/2018  ? Leg injury, right, initial encounter 01/06/2017  ? Contusion of right lower leg 01/06/2017  ? Hypoxemia 12/23/2015  ? Statin myopathy 05/20/2015  ? MCI (mild cognitive impairment) 05/20/2015  ? Edema 05/17/2015   ? 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 Breast cancer genetic susceptibility 04/02/2015  ? OSA on CPAP 04/02/2015  ? Coronary artery disease involving native coronary artery of native heart without angina pectoris 01/01/2014  ? Chest pain 01/01/2014  ? Essential hypertension   ? Ventricular tachycardia, non-sustained (HCC)   ? Breast cancer of upper-outer quadrant of right female breast ([place]) 06/28/2013  ? Postoperative visit 06/02/2013  ? Gout   ? Tachycardia   ? ?Past Medical History:  ?Diagnosis Date  ? Allergy   ? Anxiety   ? takes Ativan daily prn  ? Arthritis   ? Breast cancer ([place]) 2014  ? ER+/PR+/HEr2-,   ? Bruises easily   ? Colon polyps   ? 2 polyps by report  ? Coronary artery disease involving native coronary artery of native heart without angina pectoris 01/01/2014  ? NSTEMI in January 2019 in [place]>> s/p DES x2 to the LAD (procedure complicated by stent thrombosis) Myoview 05/2020: EF 72, no infarct or ischemia; low risk Echocardiogram 05/2019: EF 60-65, GLS -21.5 Cardiac catheterization at [facility] in 08/2017: Normal left main, normal LCx, normal RCA; LAD/diagonal 95%>> PCI   ? Diverticulosis   ? GERD (gastroesophageal reflux disease)   ? occasionally takes Nexium   ? Gout   ? takes Allopurinol daily and Colchicine daily prn;last attack 82yr ago  ? Gout   ? History of bladder infections   ? many yrs ago  ? History of bronchitis   ? last time at least 854yrago  ? History of stress incontinence   ? Hyperlipidemia   ? takes Pravastatin daily  ? Hypertension   ?  Insomnia   ? takes Ambien nightly prn  ? NSTEMI (non-ST elevated myocardial infarction) ([place]) 08/2017  ? OSA (obstructive sleep apnea)   ? on CPAP  ? Osteoarthritis   ? Peripheral edema   ? takes Furosemide daily prn  ? PONV (postoperative nausea and vomiting)   ? Postmenopausal hormone therapy   ? Radiation 07/27/13-09/07/13  ? Right Breast x 31 treatments  ? Rhinitis   ? uses Flonase prn  ? Sinus congestion   ?  Status post breast reconstruction 10/15/2014  ? Bilateral implant removal and DIEP performed in [place], [place]  ? Tachycardia   ? takes Metoprolol daily  ? Ventricular tachycardia, non-sustained (HCC)   ? during sleep study 2009 with normal cardiac workup  ?  ?Past Surgical History:  ?Procedure Laterality Date  ? ABDOMINAL HYSTERECTOMY    ? with BSO  ? APPENDECTOMY    ? AXILLARY SENTINEL NODE BIOPSY Right 05/23/2013  ? Procedure: AXILLARY SENTINEL NODE BIOPSY;  Surgeon: [name], MD;  Location: [facility];  Service: General;  Laterality: Right;  nuc med injection 7:00  ? BREAST RECONSTRUCTION WITH PLACEMENT OF TISSUE EXPANDER AND FLEX HD (ACELLULAR HYDRATED DERMIS) Bilateral 05/23/2013  ? Procedure: BILATERAL BREAST RECONSTRUCTION WITH PLACEMENT OF TISSUE EXPANDER AND FLEX HD;  Surgeon: [name], MD;  Location: [facility];  Service: Plastics;  Laterality: Bilateral;  ? CARDIAC CATHETERIZATION  1999  ? CATARACT EXTRACTION, BILATERAL Bilateral   ? CHOLECYSTECTOMY    ? COSMETIC SURGERY    ? OTHER SURGICAL HISTORY  09/2017  ? had cyst removal from spine   ? PERCUTANEOUS CORONARY STENT INTERVENTION (PCI-S)  08/2017  ? done in [place]    ? REMOVAL OF BILATERAL TISSUE EXPANDERS WITH PLACEMENT OF BILATERAL BREAST IMPLANTS Bilateral 05/17/2014  ? Procedure: REMOVAL OF BILATERAL TISSUE EXPANDERS WITH PLACEMENT OF BILATERAL BREAST IMPLANTS FOR RECONSTRUCTION;  Surgeon: [name], MD;  Location: [facility];  Service: Plastics;  Laterality: Bilateral;  ? TONSILLECTOMY    ? TOTAL KNEE ARTHROPLASTY Right 07/26/2018  ? Procedure: TOTAL KNEE ARTHROPLASTY;  Surgeon: Paralee Cancel, MD;  Location: [facility];  Service: Orthopedics;  Laterality: Right;  70 mins  ? TOTAL MASTECTOMY Bilateral 05/23/2013  ? Procedure: TOTAL MASTECTOMY;  Surgeon: [name], MD;  Location: [facility];  Service: General;  Laterality: Bilateral;  ?  ?No current facility-administered medications for this encounter.  ? ?Current Outpatient Medications  ?Medication Sig  Dispense Refill Last Dose  ? amoxicillin (AMOXIL) 500 MG tablet Take 2,000 mg by mouth as directed. Take 1 hr before dental appt     ? apraclonidine (IOPIDINE) 0.5 % ophthalmic solution Place 1 drop into the right eye daily as needed (for droopy eye).   2   ? ASPIRIN LOW DOSE 81 MG chewable tablet Chew 81 mg by mouth daily.   0   ? celecoxib (CELEBREX) 200 MG capsule Take 200 mg by mouth daily.     ? Cholecalciferol (VITAMIN D3) 50 MCG (2000 UT) TABS Take 2,000 Units by mouth daily.     ? clopidogrel (PLAVIX) 75 MG tablet Take 1 tablet (75 mg total) by mouth daily. 90 tablet 3   ? colchicine 0.6 MG tablet TAKE 1 TABLET BY MOUTH EVERY DAY OR AS NEEDED (Patient taking differently: Take 0.6 mg by mouth daily as needed (gout).) 30 tablet 0   ? cyanocobalamin (,VITAMIN B-12,) 1000 MCG/ML injection Inject 1,000 mcg into the muscle every 30 (thirty) days.     ? cyclobenzaprine (FLEXERIL)  10 MG tablet Take 1 tablet (10 mg total) by mouth 3 (three) times daily as needed for muscle spasms. 90 tablet 3   ? docusate sodium (COLACE) 100 MG capsule Take 1 capsule (100 mg total) by mouth 2 (two) times daily. (Patient taking differently: Take 100 mg by mouth 2 (two) times daily as needed for mild constipation.) 10 capsule 0   ? Doxylamine-DM & DM (VICKS DAYQUIL/NYQUIL COUGH) 6.25-15 & 15 MG/15ML LQPK Take 15 mLs by mouth every 6 (six) hours as needed (allergy symptoms).     ? ezetimibe (ZETIA) 10 MG tablet TAKE 1 TABLET(10 MG) BY MOUTH DAILY (Patient taking differently: Take 10 mg by mouth daily.) 90 tablet 2   ? fluticasone (FLONASE) 50 MCG/ACT nasal spray Place 1 spray into both nostrils daily.     ? furosemide (LASIX) 40 MG tablet Take 40 mg by mouth daily as needed for fluid.      ? lisinopril (ZESTRIL) 10 MG tablet TAKE 1 TABLET(10 MG) BY MOUTH DAILY (Patient taking differently: Take 10 mg by mouth daily.) 90 tablet 3   ? loratadine (CLARITIN) 10 MG tablet Take 10 mg by mouth daily.     ? metoprolol succinate (TOPROL-XL) 25 MG  24 hr tablet TAKE 1 TABLET(25 MG) BY MOUTH DAILY (Patient taking differently: Take 25 mg by mouth daily.) 90 tablet 3   ? Multiple Vitamin (MULTI-VITAMIN) tablet Take 1 tablet by mouth daily.     ? nitroG

## 2021-11-27 NOTE — Progress Notes (Signed)
Anesthesia Chart Review: ? ? Case: [id number] Date/Time: 12/02/21 1240  ? Procedure: TOTAL KNEE ARTHROPLASTY (Left: Knee)  ? Anesthesia type: Spinal  ? Pre-op diagnosis: Left knee osteoarthritis  ? [facility] 09 / [facility]  ? Surgeons: Paralee Cancel, MD  ? ?  ? ? ?DISCUSSION: [name] with hx CAD (s/p DES to LAD complicated by stent thrombosis, required 2nd stent 2019 in [place], [place]), HTN, OSA, CKD ? [name] 7 days before surgery ? ? ?VS: BP 108/68   Pulse 67   Temp 36.8 ?C (Oral)   Ht 5' 1.5" (1.562 m)   Wt 67 kg   LMP 08/17/1977 (Approximate)   SpO2 100%   BMI 27.46 kg/m?  ? ?PROVIDERS: ?- PCP is [name], MD ?- Cardiologist is Sherren Mocha, MD. Cleared for surgery at last office visit 11/19/21 with Richardson Dopp, PA at low risk ? ?LABS: Labs reviewed: Acceptable for surgery. ?(all labs ordered are listed, but only abnormal results are displayed) ? ?Labs Reviewed  ?CBC - Abnormal; Notable for the following components:  ?    Result Value  ? HCT 46.4 (*)   ? All other components within normal limits  ?COMPREHENSIVE METABOLIC PANEL - Abnormal; Notable for the following components:  ? BUN 32 (*)   ? Creatinine, Ser 1.38 (*)   ? GFR, Estimated 39 (*)   ? All other components within normal limits  ?SURGICAL PCR SCREEN  ?TYPE AND SCREEN  ? ? ?EKG 11/19/21: NSR ? ? ?CV: ?Nuclear stress test 05/20/20:  ?Nuclear stress EF: 72%. ?There was no ST segment deviation noted during stress. ?The study is normal. ?This is a low risk study. ?The left ventricular ejection fraction is hyperdynamic (>65%). ? ?Echo 05/30/19:  ?1. Left ventricular ejection fraction, by visual estimation, is 60 to 65%. The left ventricle has normal function. Normal left ventricular size. There is no left ventricular hypertrophy.  ?2. Left ventricular diastolic Doppler parameters are indeterminate pattern of LV diastolic filling.  ?3. Global right ventricle has normal systolic function.The right ventricular size is normal. No  increase in right ventricular wall thickness.  ?4. Left atrial size was normal.  ?5. Right atrial size was normal.  ?6. The mitral valve is normal in structure. No evidence of mitral valve regurgitation. No evidence of mitral stenosis.  ?7. The tricuspid valve is normal in structure. Tricuspid valve regurgitation is trivial.  ?8. The aortic valve is normal in structure. Aortic valve regurgitation was not visualized by color flow Doppler. Structurally normal aortic valve, with no evidence of sclerosis or stenosis.  ?9. The pulmonic valve was normal in structure. Pulmonic valve regurgitation is not visualized by color flow Doppler.  ?10. Normal pulmonary artery systolic pressure.  ?11. The inferior vena cava is normal in size with greater than 50% respiratory variability, suggesting right atrial pressure of 3 mmHg.  ?12. The average left ventricular global longitudinal strain is -21.5 %.  ? ?Cardiac catheterization 09/16/2017 [facility]): ?- LM normal ?- LAD/diagonal 95% ?- LCx normal ?- RCA normal ? ? ?Past Medical History:  ?Diagnosis Date  ? Allergy   ? Anxiety   ? takes Ativan daily prn  ? Arthritis   ? Breast cancer ([place]) 2014  ? ER+/PR+/HEr2-,   ? Bruises easily   ? Colon polyps   ? 2 polyps by report  ? Coronary artery disease involving native coronary artery of native heart without angina pectoris 01/01/2014  ? NSTEMI  in January 2019 in [place]>> s/p DES x2 to the LAD (procedure complicated by stent thrombosis) Myoview 05/2020: EF 72, no infarct or ischemia; low risk Echocardiogram 05/2019: EF 60-65, GLS -21.5 Cardiac catheter[facility] at [facility] in 08/2017: Normal left main, normal LCx, normal RCA; LAD/diagonal 95%>> PCI   ? Diverticulosis   ? GERD (gastroesophageal reflux disease)   ? occasionally takes Nexium   ? Gout   ? takes Allopurinol daily and Colchicine daily prn;last attack 38yr ago  ? Gout   ? History of bladder infections   ? many yrs ago  ? History of  bronchitis   ? last time at least 870yrago  ? History of stress incontinence   ? Hyperlipidemia   ? takes Pravastatin daily  ? Hypertension   ? Insomnia   ? takes Ambien nightly prn  ? [place]  ? OSA (obstructive sleep apnea)   ? on CPAP  ? Osteoarthritis   ? Peripheral edema   ? takes Furosemide daily prn  ? PONV (postoperative nausea and vomiting)   ? Postmenopausal hormone therapy   ? Radiation 07/27/13-09/07/13  ? Right Breast x 31 treatments  ? Rhinitis   ? uses Flonase prn  ? Sinus congestion   ? Status post breast reconstruction 10/15/2014  ? [place]  ? Tachycardia   ? takes Metoprolol daily  ? Ventricular tachycardia, non-sustained (HCC)   ? during sleep study 2009 with normal cardiac workup  ? ? ?Past Surgical History:  ?Procedure Laterality Date  ? ABDOMINAL HYSTERECTOMY    ? with BSO  ? APPENDECTOMY    ? AXILLARY SENTINEL NODE BIOPSY Right 05/23/2013  ? Procedure: AXILLARY SENTINEL NODE BIOPSY;  Surgeon: ToOdis HollingsheadMD;  [facility] General;  Laterality: Right;  nuc med injection 7:00  ? BREAST RECONSTRUCTION WITH PLACEMENT OF TISSUE EXPANDER AND FLEX HD (ACELLULAR HYDRATED DERMIS) Bilateral 05/23/2013  ? Procedure: BILATERAL BREAST RECONSTRUCTION WITH PLACEMENT OF TISSUE EXPANDER AND FLEX HD;  Surgeon: DaCrissie ReeseMD;  [facility];  Laterality: Bilateral;  ? CARDIAC CATHETERIZATION  1999  ? CATARACT EXTRACTION, BILATERAL Bilateral   ? CHOLECYSTECTOMY    ? COSMETIC SURGERY    ? OTHER SURGICAL HISTORY  09/2017  ? had cyst removal from spine   ? PERCUTANEOUS CORONARY STENT INTERVENTION (PCI-S)  08[place]2017  [place]  ? REMOVAL OF BILATERAL TISSUE EXPANDERS WITH PLACEMENT OF BILATERAL BREAST IMPLANTS Bilateral 05/17/2014  ? Procedure: REMOVAL OF BILATERAL TISSUE EXPANDERS WITH PLACEMENT OF BILATERAL BREAST IMPLANTS FOR RECONSTRUCTION;  Surgeon: DaCrissie ReeseMD;  [facility] Service: Plastics;  Laterality: Bilateral;  ? TONSILLECTOMY    ? TOTAL KNEE ARTHROPLASTY Right 07/26/2018  ? Procedure: TOTAL KNEE ARTHROPLASTY;  Surgeon: OlParalee CancelMD;  [facility];  Service: Orthopedics;  Laterality: Right;  70 mins  ? TOTAL MASTECTOMY Bilateral 05/23/2013  ? Procedure: TOTAL MASTECTOMY;  Surgeon: ToOdis HollingsheadMD;  [facility] General;  Laterality: Bilateral;  ? ? ?MEDICATIONS: ? allopurinol (ZYLOPRIM) 100 MG tablet  ? amoxicillin (AMOXIL) 500 MG tablet  ? apraclonidine (IOPIDINE) 0.5 % ophthalmic solution  ? ASPIRIN LOW DOSE 81 MG chewable tablet  ? celecoxib (CELEBREX) 200 MG capsule  ? Cholecalciferol (VITAMIN D3) 50 MCG (2000 UT) TABS  ? clopidogrel (PLAVIX) 75 MG tablet  ? colchicine 0.6 MG  tablet  ? cyanocobalamin (,VITAMIN B-12,) 1000 MCG/ML injection  ? cyclobenzaprine (FLEXERIL) 10 MG tablet  ? docusate sodium (COLACE) 100 MG capsule  ? Doxylamine-DM & DM (VICKS DAYQUIL/NYQUIL COUGH) 6.25-15 & 15 MG/15ML LQPK  ? ezetimibe (ZETIA) 10 MG tablet  ? fluticasone (FLONASE) 50 MCG/ACT nasal spray  ? furosemide (LASIX) 40 MG tablet  ? lisinopril (ZESTRIL) 10 MG tablet  ? loratadine (CLARITIN) 10 MG tablet  ? metoprolol succinate (TOPROL-XL) 25 MG 24 hr tablet  ? Multiple Vitamin (MULTI-VITAMIN) tablet  ? nitroGLYCERIN (NITROSTAT) 0.4 MG SL tablet  ? NONFORMULARY OR COMPOUNDED ITEM  ? pantoprazole (PROTONIX) 40 MG tablet  ? phentermine (ADIPEX-P) 37.5 MG tablet  ? rosuvastatin (CRESTOR) 20 MG tablet  ? sertraline (ZOLOFT) 100 MG tablet  ? testosterone cypionate (DEPOTESTOSTERONE CYPIONATE) 200 MG/ML injection  ? ?No current facility-administered medications for this encounter.  ? ?- Pt to hold plavix 7 days before surgery.  ? ? ?If no changes, I anticipate pt can proceed with surgery as scheduled.  ? ?Willeen Cass, PhD, FNP-BC ?[facility]/Anesthesiology ?Phone: [phone] ?11/27/2021 2:22 PM ? ? ? ? ? ? ?

## 2021-11-27 NOTE — Anesthesia Preprocedure Evaluation (Addendum)
Anesthesia Evaluation  ?Patient identified by MRN, date of birth, ID band ?Patient awake ? ? ? ?Reviewed: ?Allergy & Precautions, NPO status , Patient's Chart, lab work & pertinent test results ? ?History of Anesthesia Complications ?(+) PONV and history of anesthetic complications ? ?Airway ?Mallampati: III ? ?TM Distance: >3 FB ?Neck ROM: Full ? ? ? Dental ?no notable dental hx. ? ?  ?Pulmonary ?sleep apnea and Continuous Positive Airway Pressure Ventilation , former smoker,  ?  ?Pulmonary exam normal ? ? ? ? ? ? ? Cardiovascular ?hypertension, Pt. on medications and Pt. on home beta blockers ?+ CAD, + Past MI and + Cardiac Stents  ?Normal cardiovascular exam ? ? ?  ?Neuro/Psych ?Anxiety  Neuromuscular disease   ? GI/Hepatic ?Neg liver ROS, GERD  Medicated and Controlled,  ?Endo/Other  ?negative endocrine ROS ? Renal/GU ?Renal disease  ? ?  ?Musculoskeletal ? ?(+) Arthritis , Gout  ? Abdominal ?  ?Peds ? Hematology ?negative hematology ROS ?(+)   ?Anesthesia Other Findings ?Left knee osteoarthritis ? Reproductive/Obstetrics ? ?  ? ? ? ? ? ? ? ? ? ? ? ? ? ?  ?  ? ? ? ? ? ? ?Anesthesia Physical ?Anesthesia Plan ? ?ASA: 3 ? ?Anesthesia Plan: Spinal and Regional  ? ?Post-op Pain Management:   ? ?Induction: Intravenous ? ?PONV Risk Score and Plan: 3 and Ondansetron, Dexamethasone, Propofol infusion and Treatment may vary due to age or medical condition ? ?Airway Management Planned: Simple Face Mask ? ?Additional Equipment:  ? ?Intra-op Plan:  ? ?Post-operative Plan:  ? ?Informed Consent: I have reviewed the patients History and Physical, chart, labs and discussed the procedure including the risks, benefits and alternatives for the proposed anesthesia with the patient or authorized representative who has indicated his/her understanding and acceptance.  ? ? ? ?Dental advisory given ? ?Plan Discussed with: CRNA ? ?Anesthesia Plan Comments: (APP note by Durel Salts, FNP )   ? ? ? ? ? ?Anesthesia Quick Evaluation ? ?

## 2021-12-02 ENCOUNTER — Ambulatory Visit (HOSPITAL_COMMUNITY): Payer: Medicare Other | Admitting: Emergency Medicine

## 2021-12-02 ENCOUNTER — Encounter (HOSPITAL_COMMUNITY): Payer: Self-pay | Admitting: Orthopedic Surgery

## 2021-12-02 ENCOUNTER — Other Ambulatory Visit: Payer: Self-pay

## 2021-12-02 ENCOUNTER — Observation Stay (HOSPITAL_COMMUNITY)
Admission: RE | Admit: 2021-12-02 | Discharge: 2021-12-03 | Disposition: A | Discharge status: Home / Self Care | Payer: Medicare Other | Attending: Orthopedic Surgery | Admitting: Orthopedic Surgery

## 2021-12-02 ENCOUNTER — Ambulatory Visit (HOSPITAL_BASED_OUTPATIENT_CLINIC_OR_DEPARTMENT_OTHER): Payer: Medicare Other | Admitting: Certified Registered Nurse Anesthetist

## 2021-12-02 ENCOUNTER — Encounter (HOSPITAL_COMMUNITY)
Admission: RE | Disposition: A | Discharge status: Home / Self Care | Payer: Self-pay | Source: Home / Self Care | Attending: Orthopedic Surgery

## 2021-12-02 DIAGNOSIS — Z853 Personal history of malignant neoplasm of breast: Secondary | ICD-10-CM | POA: Diagnosis not present

## 2021-12-02 DIAGNOSIS — I252 Old myocardial infarction: Secondary | ICD-10-CM | POA: Diagnosis not present

## 2021-12-02 DIAGNOSIS — I2510 Atherosclerotic heart disease of native coronary artery without angina pectoris: Secondary | ICD-10-CM

## 2021-12-02 DIAGNOSIS — Z87891 Personal history of nicotine dependence: Secondary | ICD-10-CM | POA: Insufficient documentation

## 2021-12-02 DIAGNOSIS — I10 Essential (primary) hypertension: Secondary | ICD-10-CM

## 2021-12-02 DIAGNOSIS — Z7982 Long term (current) use of aspirin: Secondary | ICD-10-CM | POA: Diagnosis not present

## 2021-12-02 DIAGNOSIS — Z96651 Presence of right artificial knee joint: Secondary | ICD-10-CM | POA: Insufficient documentation

## 2021-12-02 DIAGNOSIS — M1712 Unilateral primary osteoarthritis, left knee: Secondary | ICD-10-CM

## 2021-12-02 DIAGNOSIS — Z96652 Presence of left artificial knee joint: Secondary | ICD-10-CM

## 2021-12-02 DIAGNOSIS — Z79899 Other long term (current) drug therapy: Secondary | ICD-10-CM | POA: Insufficient documentation

## 2021-12-02 DIAGNOSIS — G8918 Other acute postprocedural pain: Secondary | ICD-10-CM | POA: Diagnosis not present

## 2021-12-02 HISTORY — PX: TOTAL KNEE ARTHROPLASTY: SHX125

## 2021-12-02 LAB — TYPE AND SCREEN
ABO/RH(D): B POS
Antibody Screen: NEGATIVE

## 2021-12-02 SURGERY — ARTHROPLASTY, KNEE, TOTAL
Anesthesia: Regional | Site: Knee | Laterality: Left

## 2021-12-02 MED ORDER — ACETAMINOPHEN 10 MG/ML IV SOLN: 1000.0000 mg | Freq: Once | INTRAVENOUS | Status: DC | PRN

## 2021-12-02 MED ORDER — PANTOPRAZOLE SODIUM 40 MG PO TBEC
40.0000 mg | DELAYED_RELEASE_TABLET | Freq: Every day | ORAL | Status: DC
  Administered 2021-12-03: 40 mg via ORAL
  Filled 2021-12-02: qty 1

## 2021-12-02 MED ORDER — DIPHENHYDRAMINE HCL 12.5 MG/5ML PO ELIX: 12.5000 mg | ORAL_SOLUTION | ORAL | Status: DC | PRN

## 2021-12-02 MED ORDER — CLOPIDOGREL BISULFATE 75 MG PO TABS
75.0000 mg | ORAL_TABLET | Freq: Every day | ORAL | Status: DC
  Administered 2021-12-03: 75 mg via ORAL
  Filled 2021-12-02: qty 1

## 2021-12-02 MED ORDER — METOPROLOL SUCCINATE ER 25 MG PO TB24
25.0000 mg | ORAL_TABLET | Freq: Every day | ORAL | Status: DC
  Administered 2021-12-03: 25 mg via ORAL
  Filled 2021-12-02: qty 1

## 2021-12-02 MED ORDER — SODIUM CHLORIDE 0.9 % IV SOLN
INTRAVENOUS | Status: DC | PRN
Start: 2021-12-02 — End: 2021-12-02

## 2021-12-02 MED ORDER — PHENOL 1.4 % MT LIQD: 1.0000 | OROMUCOSAL | Status: DC | PRN

## 2021-12-02 MED ORDER — KETOROLAC TROMETHAMINE 30 MG/ML IJ SOLN
INTRAMUSCULAR | Status: DC | PRN
  Administered 2021-12-02: 30 mg

## 2021-12-02 MED ORDER — ONDANSETRON HCL 4 MG/2ML IJ SOLN
INTRAMUSCULAR | Status: DC | PRN
  Administered 2021-12-02: 4 mg via INTRAVENOUS

## 2021-12-02 MED ORDER — ROSUVASTATIN CALCIUM 20 MG PO TABS
20.0000 mg | ORAL_TABLET | Freq: Every day | ORAL | Status: DC
  Administered 2021-12-03: 20 mg via ORAL
  Filled 2021-12-02: qty 1

## 2021-12-02 MED ORDER — LACTATED RINGERS IV SOLN: INTRAVENOUS | Status: DC

## 2021-12-02 MED ORDER — KETOROLAC TROMETHAMINE 30 MG/ML IJ SOLN
INTRAMUSCULAR | Status: AC
  Filled 2021-12-02: qty 1

## 2021-12-02 MED ORDER — POVIDONE-IODINE 10 % EX SWAB
2.0000 "application " | Freq: Once | CUTANEOUS | Status: AC
  Administered 2021-12-02: 2 via TOPICAL

## 2021-12-02 MED ORDER — MIDAZOLAM HCL 2 MG/2ML IJ SOLN
1.0000 mg | INTRAMUSCULAR | Status: AC
  Administered 2021-12-02: 1 mg via INTRAVENOUS
  Filled 2021-12-02: qty 2

## 2021-12-02 MED ORDER — ALLOPURINOL 100 MG PO TABS
100.0000 mg | ORAL_TABLET | Freq: Every day | ORAL | Status: DC
  Administered 2021-12-02 – 2021-12-03 (×2): 100 mg via ORAL
  Filled 2021-12-02 (×2): qty 1

## 2021-12-02 MED ORDER — BUPIVACAINE-EPINEPHRINE (PF) 0.5% -1:200000 IJ SOLN
INTRAMUSCULAR | Status: DC | PRN
  Administered 2021-12-02: 30 mL via PERINEURAL

## 2021-12-02 MED ORDER — CEFAZOLIN SODIUM-DEXTROSE 2-4 GM/100ML-% IV SOLN
2.0000 g | INTRAVENOUS | Status: AC
  Administered 2021-12-02: 2 g via INTRAVENOUS
  Filled 2021-12-02: qty 100

## 2021-12-02 MED ORDER — BISACODYL 10 MG RE SUPP: 10.0000 mg | Freq: Every day | RECTAL | Status: DC | PRN

## 2021-12-02 MED ORDER — HYDROMORPHONE HCL 1 MG/ML IJ SOLN: 0.5000 mg | INTRAMUSCULAR | Status: DC | PRN

## 2021-12-02 MED ORDER — FENTANYL CITRATE PF 50 MCG/ML IJ SOSY: 25.0000 ug | PREFILLED_SYRINGE | INTRAMUSCULAR | Status: DC | PRN

## 2021-12-02 MED ORDER — DEXAMETHASONE SODIUM PHOSPHATE 10 MG/ML IJ SOLN
10.0000 mg | Freq: Once | INTRAMUSCULAR | Status: AC
  Administered 2021-12-03: 10 mg via INTRAVENOUS
  Filled 2021-12-02: qty 1

## 2021-12-02 MED ORDER — SODIUM CHLORIDE 0.9 % IV SOLN: INTRAVENOUS | Status: DC

## 2021-12-02 MED ORDER — FLUTICASONE PROPIONATE 50 MCG/ACT NA SUSP
1.0000 | Freq: Every day | NASAL | Status: DC
  Administered 2021-12-03: 1 via NASAL
  Filled 2021-12-02: qty 16

## 2021-12-02 MED ORDER — SODIUM CHLORIDE 0.9 % IR SOLN
Status: DC | PRN
Start: 2021-12-02 — End: 2021-12-02
  Administered 2021-12-02: 1000 mL

## 2021-12-02 MED ORDER — ASPIRIN 81 MG PO CHEW
81.0000 mg | CHEWABLE_TABLET | Freq: Every day | ORAL | Status: DC
  Administered 2021-12-03: 81 mg via ORAL
  Filled 2021-12-02: qty 1

## 2021-12-02 MED ORDER — FENTANYL CITRATE PF 50 MCG/ML IJ SOSY
50.0000 ug | PREFILLED_SYRINGE | INTRAMUSCULAR | Status: DC
  Filled 2021-12-02: qty 2

## 2021-12-02 MED ORDER — ONDANSETRON HCL 4 MG/2ML IJ SOLN: 4.0000 mg | Freq: Once | INTRAMUSCULAR | Status: DC | PRN

## 2021-12-02 MED ORDER — AMISULPRIDE (ANTIEMETIC) 5 MG/2ML IV SOLN: 10.0000 mg | Freq: Once | INTRAVENOUS | Status: DC | PRN

## 2021-12-02 MED ORDER — MENTHOL 3 MG MT LOZG: 1.0000 | LOZENGE | OROMUCOSAL | Status: DC | PRN

## 2021-12-02 MED ORDER — TRANEXAMIC ACID-NACL 1000-0.7 MG/100ML-% IV SOLN
1000.0000 mg | INTRAVENOUS | Status: AC
  Administered 2021-12-02: 1000 mg via INTRAVENOUS
  Filled 2021-12-02: qty 100

## 2021-12-02 MED ORDER — CEFAZOLIN SODIUM-DEXTROSE 2-4 GM/100ML-% IV SOLN
2.0000 g | Freq: Four times a day (QID) | INTRAVENOUS | Status: AC
  Administered 2021-12-02 – 2021-12-03 (×2): 2 g via INTRAVENOUS
  Filled 2021-12-02 (×2): qty 100

## 2021-12-02 MED ORDER — METOCLOPRAMIDE HCL 5 MG PO TABS: 5.0000 mg | ORAL_TABLET | Freq: Three times a day (TID) | ORAL | Status: DC | PRN

## 2021-12-02 MED ORDER — CELECOXIB 200 MG PO CAPS
200.0000 mg | ORAL_CAPSULE | Freq: Every day | ORAL | Status: DC
Start: 2021-12-03 — End: 2021-12-03

## 2021-12-02 MED ORDER — SODIUM CHLORIDE (PF) 0.9 % IJ SOLN
INTRAMUSCULAR | Status: DC | PRN
  Administered 2021-12-02: 30 mL

## 2021-12-02 MED ORDER — BUPIVACAINE-EPINEPHRINE (PF) 0.25% -1:200000 IJ SOLN
INTRAMUSCULAR | Status: DC | PRN
  Administered 2021-12-02: 30 mL

## 2021-12-02 MED ORDER — OXYCODONE HCL 5 MG PO TABS
5.0000 mg | ORAL_TABLET | ORAL | Status: DC | PRN
  Administered 2021-12-02 – 2021-12-03 (×4): 10 mg via ORAL
  Filled 2021-12-02 (×4): qty 2

## 2021-12-02 MED ORDER — LORATADINE 10 MG PO TABS
10.0000 mg | ORAL_TABLET | Freq: Every day | ORAL | Status: DC
  Administered 2021-12-03: 10 mg via ORAL
  Filled 2021-12-02: qty 1

## 2021-12-02 MED ORDER — COLCHICINE 0.6 MG PO TABS: 0.6000 mg | ORAL_TABLET | Freq: Every day | ORAL | Status: DC | PRN

## 2021-12-02 MED ORDER — PROPOFOL 500 MG/50ML IV EMUL
INTRAVENOUS | Status: DC | PRN
Start: 2021-12-02 — End: 2021-12-02
  Administered 2021-12-02: 80 ug/kg/min via INTRAVENOUS

## 2021-12-02 MED ORDER — ORAL CARE MOUTH RINSE: 15.0000 mL | Freq: Once | OROMUCOSAL | Status: AC

## 2021-12-02 MED ORDER — EZETIMIBE 10 MG PO TABS
10.0000 mg | ORAL_TABLET | Freq: Every day | ORAL | Status: DC
  Administered 2021-12-03: 10 mg via ORAL
  Filled 2021-12-02: qty 1

## 2021-12-02 MED ORDER — EPHEDRINE SULFATE-NACL 50-0.9 MG/10ML-% IV SOSY
PREFILLED_SYRINGE | INTRAVENOUS | Status: DC | PRN
  Administered 2021-12-02 (×2): 10 mg via INTRAVENOUS

## 2021-12-02 MED ORDER — FUROSEMIDE 40 MG PO TABS: 40.0000 mg | ORAL_TABLET | Freq: Every day | ORAL | Status: DC | PRN

## 2021-12-02 MED ORDER — FERROUS SULFATE 325 (65 FE) MG PO TABS
325.0000 mg | ORAL_TABLET | Freq: Three times a day (TID) | ORAL | Status: DC
  Administered 2021-12-03: 325 mg via ORAL
  Filled 2021-12-02: qty 1

## 2021-12-02 MED ORDER — APRACLONIDINE HCL 0.5 % OP SOLN: 1.0000 [drp] | Freq: Every day | OPHTHALMIC | Status: DC | PRN

## 2021-12-02 MED ORDER — OXYCODONE HCL 5 MG PO TABS: 10.0000 mg | ORAL_TABLET | ORAL | Status: DC | PRN

## 2021-12-02 MED ORDER — CHLORHEXIDINE GLUCONATE 0.12 % MT SOLN
15.0000 mL | Freq: Once | OROMUCOSAL | Status: AC
  Administered 2021-12-02: 15 mL via OROMUCOSAL

## 2021-12-02 MED ORDER — DOCUSATE SODIUM 100 MG PO CAPS
100.0000 mg | ORAL_CAPSULE | Freq: Two times a day (BID) | ORAL | Status: DC
  Administered 2021-12-02 – 2021-12-03 (×2): 100 mg via ORAL
  Filled 2021-12-02 (×2): qty 1

## 2021-12-02 MED ORDER — ONDANSETRON HCL 4 MG PO TABS: 4.0000 mg | ORAL_TABLET | Freq: Four times a day (QID) | ORAL | Status: DC | PRN

## 2021-12-02 MED ORDER — DEXAMETHASONE SODIUM PHOSPHATE 10 MG/ML IJ SOLN
INTRAMUSCULAR | Status: DC | PRN
Start: 2021-12-02 — End: 2021-12-02
  Administered 2021-12-02: 10 mg via INTRAVENOUS

## 2021-12-02 MED ORDER — TRANEXAMIC ACID-NACL 1000-0.7 MG/100ML-% IV SOLN
1000.0000 mg | Freq: Once | INTRAVENOUS | Status: AC
  Administered 2021-12-02: 1000 mg via INTRAVENOUS
  Filled 2021-12-02: qty 100

## 2021-12-02 MED ORDER — LISINOPRIL 10 MG PO TABS
10.0000 mg | ORAL_TABLET | Freq: Every day | ORAL | Status: DC
  Administered 2021-12-03: 10 mg via ORAL
  Filled 2021-12-02: qty 1

## 2021-12-02 MED ORDER — ACETAMINOPHEN 325 MG PO TABS: 325.0000 mg | ORAL_TABLET | Freq: Four times a day (QID) | ORAL | Status: DC | PRN

## 2021-12-02 MED ORDER — BUPIVACAINE-EPINEPHRINE (PF) 0.25% -1:200000 IJ SOLN
INTRAMUSCULAR | Status: AC
  Filled 2021-12-02: qty 30

## 2021-12-02 MED ORDER — SODIUM CHLORIDE (PF) 0.9 % IJ SOLN
INTRAMUSCULAR | Status: AC
  Filled 2021-12-02: qty 30

## 2021-12-02 MED ORDER — ONDANSETRON HCL 4 MG/2ML IJ SOLN: 4.0000 mg | Freq: Four times a day (QID) | INTRAMUSCULAR | Status: DC | PRN

## 2021-12-02 MED ORDER — NITROGLYCERIN 0.4 MG SL SUBL: 0.4000 mg | SUBLINGUAL_TABLET | SUBLINGUAL | Status: DC | PRN

## 2021-12-02 MED ORDER — SODIUM CHLORIDE 0.9 % IR SOLN
Status: DC | PRN
  Administered 2021-12-02: 1000 mL

## 2021-12-02 MED ORDER — BUPIVACAINE IN DEXTROSE 0.75-8.25 % IT SOLN
INTRATHECAL | Status: DC | PRN
  Administered 2021-12-02: 1.6 mL via INTRATHECAL

## 2021-12-02 MED ORDER — DEXAMETHASONE SODIUM PHOSPHATE 10 MG/ML IJ SOLN: 8.0000 mg | Freq: Once | INTRAMUSCULAR | Status: DC

## 2021-12-02 MED ORDER — POLYETHYLENE GLYCOL 3350 17 G PO PACK: 17.0000 g | PACK | Freq: Every day | ORAL | Status: DC | PRN

## 2021-12-02 MED ORDER — SERTRALINE HCL 100 MG PO TABS
100.0000 mg | ORAL_TABLET | Freq: Every day | ORAL | Status: DC
  Administered 2021-12-03: 100 mg via ORAL
  Filled 2021-12-02: qty 1

## 2021-12-02 MED ORDER — METOCLOPRAMIDE HCL 5 MG/ML IJ SOLN: 5.0000 mg | Freq: Three times a day (TID) | INTRAMUSCULAR | Status: DC | PRN

## 2021-12-02 MED ORDER — CYCLOBENZAPRINE HCL 10 MG PO TABS: 10.0000 mg | ORAL_TABLET | Freq: Three times a day (TID) | ORAL | Status: DC | PRN

## 2021-12-02 SURGICAL SUPPLY — 59 items
ATTUNE MED ANAT PAT 32 KNEE (Knees) ×1 IMPLANT
ATTUNE PSFEM LTSZ4 NARCEM KNEE (Femur) ×1 IMPLANT
ATTUNE PSRP INSR SZ4 10 KNEE (Insert) ×1 IMPLANT
BAG COUNTER SPONGE SURGICOUNT (BAG) ×1 IMPLANT
BAG ZIPLOCK 12X15 (MISCELLANEOUS) IMPLANT
BASE TIBIAL ROT PLAT SZ 3 KNEE (Knees) IMPLANT
BLADE SAW SGTL 11.0X1.19X90.0M (BLADE) ×1 IMPLANT
BLADE SAW SGTL 13.0X1.19X90.0M (BLADE) ×2 IMPLANT
BLADE SURG SZ10 CARB STEEL (BLADE) ×4 IMPLANT
BNDG ELASTIC 6X5.8 VLCR STR LF (GAUZE/BANDAGES/DRESSINGS) ×2 IMPLANT
BOWL SMART MIX CTS (DISPOSABLE) ×2 IMPLANT
CEMENT HV SMART SET (Cement) ×2 IMPLANT
CUFF TOURN SGL QUICK 34 (TOURNIQUET CUFF) ×1
CUFF TRNQT CYL 34X4.125X (TOURNIQUET CUFF) ×1 IMPLANT
DERMABOND ADVANCED (GAUZE/BANDAGES/DRESSINGS) ×1
DERMABOND ADVANCED .7 DNX12 (GAUZE/BANDAGES/DRESSINGS) ×1 IMPLANT
DRAPE INCISE IOBAN 66X45 STRL (DRAPES) ×2 IMPLANT
DRAPE U-SHAPE 47X51 STRL (DRAPES) ×2 IMPLANT
DRESSING AQUACEL AG SP 3.5X10 (GAUZE/BANDAGES/DRESSINGS) ×1 IMPLANT
DRSG AQUACEL AG SP 3.5X10 (GAUZE/BANDAGES/DRESSINGS) ×2
DRSG TEGADERM 4X4.75 (GAUZE/BANDAGES/DRESSINGS) ×1 IMPLANT
DURAPREP 26ML APPLICATOR (WOUND CARE) ×4 IMPLANT
ELECT REM PT RETURN 15FT ADLT (MISCELLANEOUS) ×2 IMPLANT
GLOVE BIO SURGEON STRL SZ 6 (GLOVE) ×2 IMPLANT
GLOVE BIO SURGEON STRL SZ7 (GLOVE) ×2 IMPLANT
GLOVE BIOGEL PI IND STRL 7.5 (GLOVE) ×1 IMPLANT
GLOVE BIOGEL PI IND STRL 8.5 (GLOVE) IMPLANT
GLOVE BIOGEL PI INDICATOR 7.5 (GLOVE) ×1
GLOVE BIOGEL PI INDICATOR 8.5 (GLOVE)
GLOVE INDICATOR 6.5 STRL GRN (GLOVE) ×2 IMPLANT
GLOVE SKINSENSE NS SZ8.0 LF (GLOVE) ×1
GLOVE SKINSENSE STRL SZ8.0 LF (GLOVE) ×1 IMPLANT
GOWN STRL REUS W/ TWL LRG LVL3 (GOWN DISPOSABLE) ×1 IMPLANT
GOWN STRL REUS W/TWL LRG LVL3 (GOWN DISPOSABLE) ×1
HANDPIECE INTERPULSE COAX TIP (DISPOSABLE) ×1
HOLDER FOLEY CATH W/STRAP (MISCELLANEOUS) ×1 IMPLANT
KIT TURNOVER KIT A (KITS) IMPLANT
MANIFOLD NEPTUNE II (INSTRUMENTS) ×2 IMPLANT
NDL SAFETY ECLIPSE 18X1.5 (NEEDLE) IMPLANT
NEEDLE HYPO 18GX1.5 SHARP (NEEDLE)
NS IRRIG 1000ML POUR BTL (IV SOLUTION) ×2 IMPLANT
PACK TOTAL KNEE CUSTOM (KITS) ×2 IMPLANT
PROTECTOR NERVE ULNAR (MISCELLANEOUS) ×2 IMPLANT
SET HNDPC FAN SPRY TIP SCT (DISPOSABLE) ×1 IMPLANT
SET PAD KNEE POSITIONER (MISCELLANEOUS) ×2 IMPLANT
SPIKE FLUID TRANSFER (MISCELLANEOUS) ×2 IMPLANT
SPONGE T-LAP 18X18 ~~LOC~~+RFID (SPONGE) ×2 IMPLANT
SUT MNCRL AB 4-0 PS2 18 (SUTURE) ×2 IMPLANT
SUT STRATAFIX PDS+ 0 24IN (SUTURE) ×2 IMPLANT
SUT VIC AB 1 CT1 36 (SUTURE) ×3 IMPLANT
SUT VIC AB 2-0 CT1 27 (SUTURE) ×2
SUT VIC AB 2-0 CT1 TAPERPNT 27 (SUTURE) ×2 IMPLANT
SYR 3ML LL SCALE MARK (SYRINGE) ×2 IMPLANT
TIBIAL BASE ROT PLAT SZ 3 KNEE (Knees) ×2 IMPLANT
TRAY FOLEY MTR SLVR 14FR STAT (SET/KITS/TRAYS/PACK) ×1 IMPLANT
TRAY FOLEY MTR SLVR 16FR STAT (SET/KITS/TRAYS/PACK) ×1 IMPLANT
TUBE SUCTION HIGH CAP CLEAR NV (SUCTIONS) ×2 IMPLANT
WATER STERILE IRR 1000ML POUR (IV SOLUTION) ×4 IMPLANT
WRAP KNEE MAXI GEL POST OP (GAUZE/BANDAGES/DRESSINGS) ×2 IMPLANT

## 2021-12-02 NOTE — Plan of Care (Signed)
?  Problem: Education: ?Goal: Knowledge of the prescribed therapeutic regimen will improve ?12/02/2021 2354 by Abagail Kitchens, RN ?Outcome: Progressing ?12/02/2021 2353 by Abagail Kitchens, RN ?Outcome: Progressing ?  ?Problem: Activity: ?Goal: Range of joint motion will improve ?12/02/2021 2354 by Abagail Kitchens, RN ?Outcome: Progressing ?12/02/2021 2353 by Abagail Kitchens, RN ?Outcome: Progressing ?  ?Problem: Pain Management: ?Goal: Pain level will decrease with appropriate interventions ?12/02/2021 2354 by Abagail Kitchens, RN ?Outcome: Progressing ?12/02/2021 2353 by Abagail Kitchens, RN ?Outcome: Progressing ?  ?

## 2021-12-02 NOTE — Anesthesia Procedure Notes (Signed)
Anesthesia Regional Block: Adductor canal block  ? ?Pre-Anesthetic Checklist: , timeout performed,  Correct Patient, Correct Site, Correct Laterality,  Correct Procedure,, site marked,  Risks and benefits discussed,  Surgical consent,  Pre-op evaluation,  At surgeon's request and post-op pain management ? ?Laterality: Left ? ?Prep: chloraprep     ?  ?Needles:  ?Injection technique: Single-shot ? ?Needle Type: Echogenic Stimulator Needle   ? ? ?Needle Length: 9cm  ?Needle Gauge: 21  ? ? ? ?Additional Needles: ? ? ?Procedures:,,,, ultrasound used (permanent image in chart),,    ?Narrative:  ?Start time: 12/02/2021 11:25 AM ?End time: 12/02/2021 11:35 AM ?Injection made incrementally with aspirations every 5 mL. ? ?Performed by: Personally  ?Anesthesiologist: Murvin Natal, MD ? ?Additional Notes: ?Functioning IV was confirmed and monitors were applied. A time-out was performed. Hand hygiene and sterile gloves were used. The thigh was placed in a frog-leg position and prepped in a sterile fashion. A 15m 21ga Arrow echogenic stimulator needle was placed using ultrasound guidance.  Negative aspiration and negative test dose prior to incremental administration of local anesthetic. The patient tolerated the procedure well. ? ? ? ? ? ?

## 2021-12-02 NOTE — Anesthesia Postprocedure Evaluation (Signed)
Anesthesia Post Note ? ?Patient: Tracey [name] ? ?Procedure(s) Performed: TOTAL KNEE ARTHROPLASTY (Left: Knee) ? ?  ? ?Patient location during evaluation: PACU ?Anesthesia Type: Regional and Spinal ?Level of consciousness: awake ?Pain management: pain level controlled ?Vital Signs Assessment: post-procedure vital signs reviewed and stable ?Respiratory status: spontaneous breathing, nonlabored ventilation, respiratory function stable and patient connected to nasal cannula oxygen ?Cardiovascular status: stable and blood pressure returned to baseline ?Postop Assessment: no apparent nausea or vomiting ?Anesthetic complications: no ? ? ?No notable events documented. ? ?Last Vitals:  ?Vitals:  ? 12/02/21 1600 12/02/21 1628  ?BP: 140/62 (!) 137/56  ?Pulse: 69 65  ?Resp: 12 16  ?Temp:  36.6 ?C  ?SpO2: 99% 100%  ?  ?Last Pain:  ?Vitals:  ? 12/02/21 1628  ?TempSrc: Oral  ?PainSc: 0-No pain  ? ? ?  ?  ?  ?  ?  ?  ? ?[name] P Adien Kimmel ? ? ? ? ?

## 2021-12-02 NOTE — Discharge Instructions (Signed)

## 2021-12-02 NOTE — Anesthesia Procedure Notes (Signed)
Spinal ? ?Patient location during procedure: OR ?Start time: 12/02/2021 12:30 PM ?Reason for block: surgical anesthesia ?Staffing ?Performed: resident/CRNA  ?Anesthesiologist: Murvin Natal, MD ?Resident/CRNA: Gerald Leitz, CRNA ?Preanesthetic Checklist ?Completed: patient identified, IV checked, site marked, risks and benefits discussed, surgical consent, monitors and equipment checked, pre-op evaluation and timeout performed ?Spinal Block ?Patient position: sitting ?Prep: DuraPrep ?Patient monitoring: heart rate, continuous pulse ox, blood pressure and cardiac monitor ?Approach: midline ?Location: L3-4 ?Injection technique: single-shot ?Needle ?Needle type: Introducer and Pencan  ?Needle gauge: 24 G ?Needle length: 9 cm ?Assessment ?Sensory level: T4 ?Events: CSF return ?Additional Notes ?Negative paresthesia. Negative blood return. Positive free-flowing CSF. Expiration date of kit checked and confirmed. Patient tolerated procedure well, without complications. ? ? ? ? ? ?

## 2021-12-02 NOTE — Interval H&P Note (Signed)
History and Physical Interval Note: ? ?12/02/2021 ?11:00 AM ? ?Tracey [name]  has presented today for surgery, with the diagnosis of Left knee osteoarthritis.  The various methods of treatment have been discussed with the patient and family. After consideration of risks, benefits and other options for treatment, the patient has consented to  Procedure(s): ?TOTAL KNEE ARTHROPLASTY (Left) as a surgical intervention.  The patient's history has been reviewed, patient examined, no change in status, stable for surgery.  I have reviewed the patient's chart and labs.  Questions were answered to the patient's satisfaction.   ? ? ?[name] ? ? ?

## 2021-12-02 NOTE — Evaluation (Signed)
Physical Therapy Evaluation ?Patient Details ?Name: Tracey [name] ?MRN: [id number] ?DOB: [birth year] ?Today's Date: 12/02/2021 ? ?History of Present Illness ? Pt is a 76yo female presenting s/p L-TKA on 12/02/21. PMH: OA, CAD, hx of breast ca with b/l total mastectomy, GERD, gout, HTN hyperlipidemia, NSTEMI, tachycardia, R-TKA 2019.  ?Clinical Impression ? Tracey [name] is a 76 y.o. female POD 0 s/p L-TKA. Patient reports modified independence with mobility at baseline. Patient is now limited by functional impairments (see PT problem list below) and requires min assist for bed mobility and transfers. Patient was able to ambulate 15 feet with RW and min guard assist. Patient instructed in exercise to facilitate ROM and circulation to manage edema. Patient will benefit from continued skilled PT interventions to address impairments and progress towards PLOF. Acute PT will follow to progress mobility and stair training in preparation for safe discharge home.   ?   ? ?Recommendations for follow up therapy are one component of a multi-disciplinary discharge planning process, led by the attending physician.  Recommendations may be updated based on patient status, additional functional criteria and insurance authorization. ? ?Follow Up Recommendations Follow physician's recommendations for discharge plan and follow up therapies ? ?  ?Assistance Recommended at Discharge Intermittent Supervision/Assistance  ?Patient can return home with the following ? A little help with walking and/or transfers;A little help with bathing/dressing/bathroom;Assistance with cooking/housework;Assist for transportation;Help with stairs or ramp for entrance ? ?  ?Equipment Recommendations None recommended by PT (pt has recommended DME)  ?Recommendations for Other Services ?    ?  ?Functional Status Assessment Patient has had a recent decline in their functional status and demonstrates the ability to make significant improvements in function in a  reasonable and predictable amount of time.  ? ?  ?Precautions / Restrictions Precautions ?Precautions: None ?Restrictions ?Weight Bearing Restrictions: Yes ?LLE Weight Bearing: Weight bearing as tolerated  ? ?  ? ?Mobility ? Bed Mobility ?Overal bed mobility: Needs Assistance ?Bed Mobility: Supine to Sit ?  ?  ?Supine to sit: Min assist ?  ?  ?General bed mobility comments: Pt min assist for trunk raising and lowering LLE off bed, VCs for hand placement. ?  ? ?Transfers ?Overall transfer level: Needs assistance ?Equipment used: Rolling walker (2 wheels) ?Transfers: Sit to/from Stand ?Sit to Stand: Min assist ?  ?  ?  ?  ?  ?General transfer comment: Pt min assist for steadying of RW, VCs for hand placement and powering up through RLE. Upon standing pt taken through weight shifting and 2-3 marches to demonstrate WBAT status and decrease level of fear/anxiety about movement. ?  ? ?Ambulation/Gait ?Ambulation/Gait assistance: Min guard, Supervision ?Gait Distance (Feet): 15 Feet ?Assistive device: Rolling walker (2 wheels) ?Gait Pattern/deviations: Step-to pattern, Decreased step length - right, Decreased stance time - left, Decreased weight shift to left ?Gait velocity: decreased ?  ?  ?General Gait Details: Pt ambulated ~38f in room with min guard assist progressed to supervision to allow caregivers practice in appropriate and safe guarding technique. VCs for sequencing and proximity to device. Pt demonstrated decreased stance time on L and heavy use of BUE on RW. No overt LOB. ? ?Stairs ?  ?  ?  ?  ?  ? ?Wheelchair Mobility ?  ? ?Modified Rankin (Stroke Patients Only) ?  ? ?  ? ?Balance Overall balance assessment: Needs assistance ?Sitting-balance support: Feet supported, No upper extremity supported ?Sitting balance-Leahy Scale: Fair ?  ?  ?Standing balance support: Bilateral upper extremity  supported, During functional activity, Reliant on assistive device for balance ?Standing balance-Leahy Scale: Poor ?  ?  ?   ?  ?  ?  ?  ?  ?  ?  ?  ?  ?   ? ? ? ?Pertinent Vitals/Pain Pain Assessment ?Pain Assessment: No/denies pain  ? ? ?Home Living Family/patient expects to be discharged to:: Private residence ?Living Arrangements: Spouse/significant other ?Available Help at Discharge: Family;Available 24 hours/day ?Type of Home: House ?Home Access: Stairs to enter ?Entrance Stairs-Rails: None ?Entrance Stairs-Number of Steps: 2 ?  ?Home Layout: Two level;Able to live on main level with bedroom/bathroom ?Home Equipment: Cane - single Barista (2 wheels) ?   ?  ?Prior Function Prior Level of Function : Independent/Modified Independent ?  ?  ?  ?  ?  ?  ?Mobility Comments: spc at all times ?ADLs Comments: ind ?  ? ? ?Hand Dominance  ?   ? ?  ?Extremity/Trunk Assessment  ? Upper Extremity Assessment ?Upper Extremity Assessment: Overall WFL for tasks assessed ?  ? ?Lower Extremity Assessment ?Lower Extremity Assessment: RLE deficits/detail;LLE deficits/detail ?RLE Deficits / Details: MMT: ank DF/PF 5/5 ?RLE Sensation: WNL ?LLE Deficits / Details: MMT: ank DF/PF 5/5, no extensor lag noted ?LLE Sensation: WNL ?  ? ?Cervical / Trunk Assessment ?Cervical / Trunk Assessment: Normal  ?Communication  ? Communication: No difficulties  ?Cognition Arousal/Alertness: Awake/alert ?Behavior During Therapy: [place] for tasks assessed/performed ?Overall Cognitive Status: Within Functional Limits for tasks assessed ?  ?  ?  ?  ?  ?  ?  ?  ?  ?  ?  ?  ?  ?  ?  ?  ?  ?  ?  ? ?  ?General Comments   ? ?  ?Exercises Total Joint Exercises ?Ankle Circles/Pumps: AROM, 20 reps, Both  ? ?Assessment/Plan  ?  ?PT Assessment Patient needs continued PT services  ?PT Problem List Decreased strength;Decreased range of motion;Decreased activity tolerance;Decreased balance;Decreased mobility;Decreased coordination;Decreased knowledge of use of DME;Pain ? ?   ?  ?PT Treatment Interventions DME instruction;Gait training;Stair training;Functional mobility  training;Therapeutic activities;Therapeutic exercise;Balance training;Neuromuscular re-education;Patient/family education   ? ?PT Goals (Current goals can be found in the Care Plan section)  ?Acute Rehab PT Goals ?Patient Stated Goal: Golfing and try pickleball ?PT Goal Formulation: With patient ?Time For Goal Achievement: 12/09/21 ?Potential to Achieve Goals: Good ? ?  ?Frequency 7X/week ?  ? ? ?Co-evaluation   ?  ?  ?  ?  ? ? ?  ?AM-PAC PT "6 Clicks" Mobility  ?Outcome Measure Help needed turning from your back to your side while in a flat bed without using bedrails?: None ?Help needed moving from lying on your back to sitting on the side of a flat bed without using bedrails?: A Little ?Help needed moving to and from a bed to a chair (including a wheelchair)?: A Little ?Help needed standing up from a chair using your arms (e.g., wheelchair or bedside chair)?: A Little ?Help needed to walk in hospital room?: A Little ?Help needed climbing 3-5 steps with a railing? : A Little ?6 Click Score: 19 ? ?  ?End of Session Equipment Utilized During Treatment: Gait belt ?Activity Tolerance: Patient tolerated treatment well ?Patient left: in chair;with call bell/phone within reach;with chair alarm set;with family/visitor present;with SCD's reapplied ?Nurse Communication: Mobility status ?PT Visit Diagnosis: Pain;Difficulty in walking, not elsewhere classified (R26.2) ?Pain - Right/Left: Left ?Pain - part of body: Knee ?  ? ?Time: 5361-4431 ?  PT Time Calculation (min) (ACUTE ONLY): 42 min ? ? ?Charges:   PT Evaluation ?$PT Eval Low Complexity: 1 Low ?PT Treatments ?$Gait Training: 8-22 mins ?$Self Care/Home Management: 8-22 ?  ?   ? ? ?Coolidge Breeze, PT, DPT ?[facility] Rehabilitation Department ?Office: [phone] ?Pager: [phone] ? ?Coolidge Breeze ?12/02/2021, 7:05 PM ? ?

## 2021-12-02 NOTE — Op Note (Signed)
NAME:  Tracey [name]                  ?   ? MEDICAL RECORD NO.:  [id number]  ?   ?                        FACILITY:  [facility]  ?   ? PHYSICIAN:  Pietro Cassis. Alvan Dame, M.D.  DATE OF BIRTH:  01-17-1946  ?   ? DATE OF PROCEDURE:  12/02/2021   ?  ?   ?                             OPERATIVE REPORT  ?   ?   ? PREOPERATIVE DIAGNOSIS:  Left knee osteoarthritis.  ?   ? POSTOPERATIVE DIAGNOSIS:  Left knee osteoarthritis.  ?   ? FINDINGS:  The patient was noted to have complete loss of cartilage and  ? bone-on-bone arthritis with associated osteophytes in the medial and patellofemoral compartments of  ? the knee with a significant synovitis and associated effusion.  The patient had failed months of conservative treatment including medications, injection therapy, activity modification. ?   ? PROCEDURE:  Left total knee replacement.  ?   ? COMPONENTS USED:  DePuy Attune rotating platform posterior stabilized knee  ? system, a size 4n femur, 3 tibia, size 10 mm PS AOX insert, and 32 anatomic patellar  ? button.  ?   ? SURGEON:  Pietro Cassis. Alvan Dame, M.D.  ?   ? ASSISTANT:  Costella Hatcher, PA-C.  ?   ? ANESTHESIA:  Regional and Spinal.  ?   ? SPECIMENS:  None.  ?   ? COMPLICATION:  None.  ?   ? DRAINS:  None. ? ?EBL: <100 cc  ?   ? TOURNIQUET TIME:   ?Total Tourniquet Time Documented: ?Thigh (Left) - 26 minutes ?Total: Thigh (Left) - 26 minutes ? .  ?   ? The patient was stable to the recovery room.  ?   ? INDICATION FOR PROCEDURE:  Tracey [name] is a 76 y.o. female patient of  ? mine.  The patient had been seen, evaluated, and treated for months conservatively in the  ? office with medication, activity modification, and injections.  The patient had  ? radiographic changes of bone-on-bone arthritis with endplate sclerosis and osteophytes noted.  Based on the radiographic changes and failed conservative measures, the patient  ? decided to proceed with definitive treatment, total knee replacement.  Risks of infection, DVT, component failure, need  for revision surgery, neurovascular injury were reviewed in the office setting.  The postop course was reviewed stressing the efforts to maximize post-operative satisfaction and function.  Consent was obtained for benefit of pain  ? relief.  ?   ? PROCEDURE IN DETAIL:  The patient was brought to the operative theater.  ? Once adequate anesthesia, preoperative antibiotics, 2 gm of Ancef,1 gm of Tranexamic Acid, and 10 mg of Decadron administered, the patient was positioned supine with a left thigh tourniquet placed.  The  ?left lower extremity was prepped and draped in sterile fashion.  A time-  ? out was performed identifying the patient, planned procedure, and the appropriate extremity.  ?   ? The left lower extremity was placed in the [facility] leg holder.  The leg was  ? exsanguinated, tourniquet elevated to 250 mmHg.  A midline incision was  ?  made followed by median parapatellar arthrotomy.  Following initial  ? exposure, attention was first directed to the patella.  Precut  ? measurement was noted to be 22 mm.  I resected down to 13 mm and used a  ? 32 anatomic patellar button to restore patellar height as well as cover the cut surface.  ?   ? The lug holes were drilled and a metal shim was placed to protect the  ? patella from retractors and saw blade during the procedure.  ?   ? At this point, attention was now directed to the femur.  The femoral  ? canal was opened with a drill, irrigated to try to prevent fat emboli.  An  ? intramedullary rod was passed at 3 degrees valgus, 9 mm of bone was  ? resected off the distal femur.  Following this resection, the tibia was  ? subluxated anteriorly.  Using the extramedullary guide, 3 mm of bone was resected off  ? the proximal medial tibia.  We confirmed the gap would be  ? stable medially and laterally with a size 6 spacer block as well as confirmed that the tibial cut was perpendicular in the coronal plane, checking with an alignment rod.  ?   ? Once this was done, I  sized the femur to be a size 4 in the anterior-  ? posterior dimension, chose a narrow component based on medial and  ? lateral dimension.  The size 4 rotation block was then pinned in  ? position anterior referenced using the C-clamp to set rotation.  The  ? anterior, posterior, and  chamfer cuts were made without difficulty nor  ? notching making certain that I was along the anterior cortex to help  ? with flexion gap stability.  ?   ? The final box cut was made off the lateral aspect of distal femur.  ?   ? At this point, the tibia was sized to be a size 3.  The size 3 tray was  ? then pinned in position through the medial third of the tubercle,  ? drilled, and keel punched.  Trial reduction was now carried with a 4 femur, ? 3 tibia, a size 8 up to the 10 mm PS insert, and the 32 anatomic patella botton.  The knee was brought to full extension with good flexion stability with the patella  ? tracking through the trochlea without application of pressure.  Given  ? all these findings the trial components removed.  Final components were  ? opened and cement was mixed.  The knee was irrigated with normal saline solution and pulse lavage.  The synovial lining was  ? then injected with 30 cc of 0.25% Marcaine with epinephrine, 1 cc of Toradol and 30 cc of NS for a total of 61 cc.  ?   ?Final implants were then cemented onto cleaned and dried cut surfaces of bone with the knee brought to extension with a size 10 mm PS trial insert.  ?   ? Once the cement had fully cured, excess cement was removed  ? throughout the knee.  I confirmed that I was satisfied with the range of  ? motion and stability, and the final size 10 mm PS AOX insert was chosen.  It was  ? placed into the knee.  ?   ? The tourniquet had been let down at 26 minutes.  No significant  ? hemostasis was required.  The extensor mechanism was then  reapproximated using #1 Vicryl and #1 Stratafix sutures with the knee  ? in flexion.  The  ? remaining wound was  closed with 2-0 Vicryl and running 4-0 Monocryl.  ? The knee was cleaned, dried, dressed sterilely using Dermabond and  ? Aquacel dressing.  The patient was then  ? brought to recovery room in stable condition, tolerating the procedure  ? well.  ? ?Please note that Physician Assistant, Costella Hatcher, PA-C was present for the entirety of the case, and was utilized for pre-operative positioning, peri-operative retractor management, general facilitation of the procedure and for primary wound closure at the end of the case. ?   ?   ?   ?   ? Pietro Cassis Alvan Dame, M.D.  ? ? ?12/02/2021 1:37 PM  ?

## 2021-12-02 NOTE — Progress Notes (Signed)
AssistedDr. Roanna Banning with left, adductor canal block. Side rails up, monitors on throughout procedure. See vital signs in flow sheet. Tolerated Procedure well. ? ?

## 2021-12-02 NOTE — Transfer of Care (Signed)
Immediate Anesthesia Transfer of Care Note ? ?Patient: Tracey [name] ? ?Procedure(s) Performed: Procedure(s): ?TOTAL KNEE ARTHROPLASTY (Left) ? ?Patient Location: PACU ? ?Anesthesia Type:Spinal ? ?Level of Consciousness: awake, alert  and oriented ? ?Airway & Oxygen Therapy: Patient Spontanous Breathing ? ?Post-op Assessment: Report given to RN and Post -op Vital signs reviewed and stable ? ?Post vital signs: Reviewed and stable ? ?Last Vitals:  ?Vitals:  ? 12/02/21 1133 12/02/21 1138  ?BP:    ?Pulse: (!) 58 (!) 59  ?Resp: 12 13  ?Temp:    ?SpO2: 100% 99%  ? ? ?Complications: No apparent anesthesia complications ? ?

## 2021-12-03 ENCOUNTER — Encounter (HOSPITAL_COMMUNITY): Payer: Self-pay | Admitting: Orthopedic Surgery

## 2021-12-03 DIAGNOSIS — M1712 Unilateral primary osteoarthritis, left knee: Secondary | ICD-10-CM | POA: Diagnosis not present

## 2021-12-03 DIAGNOSIS — I252 Old myocardial infarction: Secondary | ICD-10-CM | POA: Diagnosis not present

## 2021-12-03 DIAGNOSIS — Z96651 Presence of right artificial knee joint: Secondary | ICD-10-CM | POA: Diagnosis not present

## 2021-12-03 DIAGNOSIS — I2510 Atherosclerotic heart disease of native coronary artery without angina pectoris: Secondary | ICD-10-CM | POA: Diagnosis not present

## 2021-12-03 DIAGNOSIS — Z853 Personal history of malignant neoplasm of breast: Secondary | ICD-10-CM | POA: Diagnosis not present

## 2021-12-03 DIAGNOSIS — I10 Essential (primary) hypertension: Secondary | ICD-10-CM | POA: Diagnosis not present

## 2021-12-03 LAB — CBC
HCT: 33.3 % — ABNORMAL LOW (ref 36.0–46.0)
Hemoglobin: 11.1 g/dL — ABNORMAL LOW (ref 12.0–15.0)
MCH: 31.4 pg (ref 26.0–34.0)
MCHC: 33.3 g/dL (ref 30.0–36.0)
MCV: 94.1 fL (ref 80.0–100.0)
Platelets: 178 10*3/uL (ref 150–400)
RBC: 3.54 MIL/uL — ABNORMAL LOW (ref 3.87–5.11)
RDW: 13.6 % (ref 11.5–15.5)
WBC: 8.4 10*3/uL (ref 4.0–10.5)
nRBC: 0 % (ref 0.0–0.2)

## 2021-12-03 LAB — BASIC METABOLIC PANEL
Anion gap: 6 (ref 5–15)
BUN: 25 mg/dL — ABNORMAL HIGH (ref 8–23)
CO2: 23 mmol/L (ref 22–32)
Calcium: 8.2 mg/dL — ABNORMAL LOW (ref 8.9–10.3)
Chloride: 107 mmol/L (ref 98–111)
Creatinine, Ser: 1.45 mg/dL — ABNORMAL HIGH (ref 0.44–1.00)
GFR, Estimated: 37 mL/min — ABNORMAL LOW (ref >60)
Glucose, Bld: 142 mg/dL — ABNORMAL HIGH (ref 70–99)
Potassium: 4.8 mmol/L (ref 3.5–5.1)
Sodium: 136 mmol/L (ref 135–145)

## 2021-12-03 MED ORDER — OXYCODONE HCL 5 MG PO TABS: 5.0000 mg | ORAL_TABLET | ORAL | 0 refills | Status: DC | PRN

## 2021-12-03 MED ORDER — POLYETHYLENE GLYCOL 3350 17 G PO PACK: 17.0000 g | PACK | Freq: Every day | ORAL | 0 refills | Status: AC | PRN

## 2021-12-03 NOTE — Progress Notes (Signed)
BSC delivered to room.  Waiting for PT. ?

## 2021-12-03 NOTE — TOC Transition Note (Signed)
Transition of Care (TOC) - CM/SW Discharge Note ? ? ?Patient Details  ?Name: TREENA COSMAN ?MRN: [id number] ?Date of Birth: 02/11/1946 ? ?Transition of Care (TOC) CM/SW Contact:  ?Onesha Krebbs, LCSW ?Phone Number: ?12/03/2021, 10:26 AM ? ? ?Clinical Narrative:    ? ?Met with pt and spouse today and confirming need for 3n1 commode with no agency preference - item ordered with [employer] for delivery to room.  Pt already set up for OPPT at [facility].  No further TOC needs. ? ?Final next level of care: OP Rehab ?Barriers to Discharge: No Barriers Identified ? ? ?Patient Goals and CMS Choice ?Patient states their goals for this hospitalization and ongoing recovery are:: return home ?  ?  ? ?Discharge Placement ?  ?           ?  ?  ?  ?  ? ?Discharge Plan and Services ?  ?  ?           ?DME Arranged: 3-N-1 ?DME Agency: [employer] ?Date DME Agency Contacted: 12/03/21 ?Time DME Agency Contacted: [phone] ?Representative spoke with at DME Agency: Wells Guiles ?  ?  ?  ?  ?  ? ?Social Determinants of Health (SDOH) Interventions ?  ? ? ?Readmission Risk Interventions ?   ? View : No data to display.  ?  ?  ?  ? ? ? ? ? ?

## 2021-12-03 NOTE — Progress Notes (Addendum)
? ?Subjective: ?1 Day Post-Op Procedure(s) (LRB): ?TOTAL KNEE ARTHROPLASTY (Left) ?Patient reports pain as mild.   ?Patient seen in rounds with Dr. Alvan Dame. ?Patient is well, and has had no acute complaints or problems. Family at the bedside this morning. Patient is in good spirits. She did well with Oxycodone. No acute events overnight. Foley catheter removed. Patient ambulated 15 feet with PT.  ?We will start therapy today.  ? ?Objective: ?Vital signs in last 24 hours: ?Temp:  [97.7 ?F (36.5 ?C)-98.7 ?F (37.1 ?C)] 97.7 ?F (36.5 ?C) (04/19 5093) ?Pulse Rate:  [58-74] 58 (04/19 0511) ?Resp:  [12-36] 18 (04/19 0511) ?BP: (121-140)/(53-65) 125/61 (04/19 0511) ?SpO2:  [97 %-100 %] 99 % (04/19 0511) ?Weight:  [68 kg] 68 kg (04/18 1044) ? ?Intake/Output from previous day: ? ?Intake/Output Summary (Last 24 hours) at 12/03/2021 0751 ?Last data filed at 12/03/2021 0600 ?Gross per 24 hour  ?Intake 2944.9 ml  ?Output 1310 ml  ?Net 1634.9 ml  ?  ? ?Intake/Output this shift: ?No intake/output data recorded. ? ?Labs: ?Recent Labs  ?  12/03/21 ?0333  ?HGB 11.1*  ? ?Recent Labs  ?  12/03/21 ?0333  ?WBC 8.4  ?RBC 3.54*  ?HCT 33.3*  ?PLT 178  ? ?Recent Labs  ?  12/03/21 ?0333  ?NA 136  ?K 4.8  ?CL 107  ?CO2 23  ?BUN 25*  ?CREATININE 1.45*  ?GLUCOSE 142*  ?CALCIUM 8.2*  ? ?No results for input(s): LABPT, INR in the last 72 hours. ? ?Exam: ?General - Patient is Alert and Oriented ?Extremity - Neurologically intact ?Sensation intact distally ?Intact pulses distally ?Dorsiflexion/Plantar flexion intact ?Dressing - dressing C/D/I ?Motor Function - intact, moving foot and toes well on exam.  ? ?Past Medical History:  ?Diagnosis Date  ? Allergy   ? Anxiety   ? takes Ativan daily prn  ? Arthritis   ? Breast cancer ([place]) 2014  ? ER+/PR+/HEr2-,   ? Bruises easily   ? Colon polyps   ? 2 polyps by report  ? Coronary artery disease involving native coronary artery of native heart without angina pectoris 01/01/2014  ? NSTEMI in January 2019 in [place]  [place]>> s/p DES x2 to the LAD (procedure complicated by stent thrombosis) Myoview 05/2020: EF 72, no infarct or ischemia; low risk Echocardiogram 05/2019: EF 60-65, GLS -21.5 Cardiac catheter[facility] in 08/2017: Normal left main, normal LCx, normal RCA; LAD/diagonal 95%>> PCI   ? Diverticulosis   ? GERD (gastroesophageal reflux disease)   ? occasionally takes Nexium   ? Gout   ? takes Allopurinol daily and Colchicine daily prn;last attack 65yr ago  ? Gout   ? History of bladder infections   ? many yrs ago  ? History of bronchitis   ? last time at least 88yrago  ? History of stress incontinence   ? Hyperlipidemia   ? takes Pravastatin daily  ? Hypertension   ? Insomnia   ? takes Ambien nightly prn  ? [place]  ? OSA (obstructive sleep apnea)   ? on CPAP  ? Osteoarthritis   ? Peripheral edema   ? takes Furosemide daily prn  ? PONV (postoperative nausea and vomiting)   ? Postmenopausal hormone therapy   ? Radiation 07/27/13-09/07/13  ? Right Breast x 31 treatments  ? Rhinitis   ? uses Flonase prn  ? Sinus congestion   ? Status post breast reconstruction 10/15/2014  ? Bilateral implant removal and DIEP performed  in [place], [place]  ? Tachycardia   ? takes Metoprolol daily  ? Ventricular tachycardia, non-sustained (HCC)   ? during sleep study 2009 with normal cardiac workup  ? ? ?Assessment/Plan: ?1 Day Post-Op Procedure(s) (LRB): ?TOTAL KNEE ARTHROPLASTY (Left) ?Principal Problem: ?  S/P total knee arthroplasty, left ? ?Estimated body mass index is 27.87 kg/m? as calculated from the following: ?  Height as of this encounter: 5' 1.5" (1.562 m). ?  Weight as of this encounter: 68 kg. ?Advance diet ?Up with therapy ?D/C IV fluids ? ? ?Patient's anticipated LOS is less than 2 midnights, meeting these requirements: ?- Younger than 66 ?- Lives within 1 hour of care ?- Has a competent adult at home to recover with post-op recover ?- NO history of ? -  Chronic pain requiring opiods ? - Diabetes ? - Coronary Artery Disease ? - Heart failure ? - Heart attack ? - Stroke ? - DVT/VTE ? - Cardiac arrhythmia ? - Respiratory Failure/COPD ? - Renal failure ? - Anemia ? - Advanced Liver disease ? ?  ? ?DVT Prophylaxis - Aspirin ?Weight bearing as tolerated. ? ?Hgb stable at 11.1 this AM.  ? ?Plan is to go Home after hospital stay. Plan for discharge today following 1-2 sessions of PT as long as they are meeting their goals. Patient is scheduled for OPPT. Follow up in the office in 2 weeks.  ? ?Griffith Citron, PA-C ?Orthopedic Surgery ?[phone] ?12/03/2021, 7:51 AM  ?

## 2021-12-03 NOTE — Progress Notes (Signed)
Physical Therapy Treatment ?Patient Details ?Name: Tracey [name] ?MRN: [id number] ?DOB: March 17, 1946 ?Today's Date: 12/03/2021 ? ? ?History of Present Illness Pt is a 76yo female presenting s/p L-TKA on 12/02/21. PMH: OA, CAD, hx of breast ca with b/l total mastectomy, GERD, gout, HTN hyperlipidemia, NSTEMI, tachycardia, R-TKA 2019. ? ?  ?PT Comments  ? ? Pt is progressing well with mobility. She ambulated 23' with RW, completed stair training, and demonstrates good understanding of HEP.    ?Recommendations for follow up therapy are one component of a multi-disciplinary discharge planning process, led by the attending physician.  Recommendations may be updated based on patient status, additional functional criteria and insurance authorization. ? ?Follow Up Recommendations ? Follow physician's recommendations for discharge plan and follow up therapies ?  ?  ?Assistance Recommended at Discharge Intermittent Supervision/Assistance  ?Patient can return home with the following A little help with walking and/or transfers;A little help with bathing/dressing/bathroom;Assistance with cooking/housework;Assist for transportation;Help with stairs or ramp for entrance ?  ?Equipment Recommendations ? None recommended by PT (pt has recommended DME)  ?  ?Recommendations for Other Services   ? ? ?  ?Precautions / Restrictions Precautions ?Precautions: Knee ?Precaution Booklet Issued: Yes (comment) ?Precaution Comments: reviewed no pillow under knee ?Restrictions ?Weight Bearing Restrictions: Yes ?LLE Weight Bearing: Weight bearing as tolerated  ?  ? ?Mobility ? Bed Mobility ?  ?  ?  ?  ?  ?  ?  ?General bed mobility comments: up in recliner ?  ? ?Transfers ?Overall transfer level: Needs assistance ?Equipment used: Rolling walker (2 wheels) ?Transfers: Sit to/from Stand ?Sit to Stand: Supervision ?  ?  ?  ?  ?  ?General transfer comment: VCs for hand placement ?  ? ?Ambulation/Gait ?Ambulation/Gait assistance: Supervision ?Gait Distance  (Feet): 130 Feet ?Assistive device: Rolling walker (2 wheels) ?Gait Pattern/deviations: Step-to pattern, Decreased step length - right, Decreased stance time - left, Decreased weight shift to left ?Gait velocity: decreased ?  ?  ?General Gait Details: steady with RW, no loss of balance, VCs to roll rather than lift RW and to keep hands on RW at all times ? ? ?Stairs ?Stairs: Yes ?Stairs assistance: Min assist ?Stair Management: No rails, Backwards, With walker ?Number of Stairs: 3 ?General stair comments: VCs sequencing, no loss of balance, min A to manage RW, husband present and assisted with RW management. Also performed 5 steps forwards with 1 rail and SPC, VCs sequencing. ? ? ?Wheelchair Mobility ?  ? ?Modified Rankin (Stroke Patients Only) ?  ? ? ?  ?Balance Overall balance assessment: Needs assistance ?Sitting-balance support: Feet supported, No upper extremity supported ?Sitting balance-Leahy Scale: Fair ?  ?  ?Standing balance support: Bilateral upper extremity supported, During functional activity, Reliant on assistive device for balance ?Standing balance-Leahy Scale: Poor ?  ?  ?  ?  ?  ?  ?  ?  ?  ?  ?  ?  ?  ? ?  ?Cognition Arousal/Alertness: Awake/alert ?Behavior During Therapy: [facility] for tasks assessed/performed ?Overall Cognitive Status: Within Functional Limits for tasks assessed ?  ?  ?  ?  ?  ?  ?  ?  ?  ?  ?  ?  ?  ?  ?  ?  ?  ?  ?  ? ?  ?Exercises Total Joint Exercises ?Ankle Circles/Pumps: AROM, 20 reps, Both ?Quad Sets: AROM, Left, 5 reps, Supine ?Short Arc Quad: AROM, Left, Supine, 10 reps ?Heel Slides: AAROM, Left, 10 reps, Supine ?  Hip ABduction/ADduction: AROM, Left, 10 reps, Supine ?Straight Leg Raises: AROM, Left, 5 reps, Supine ?Long Arc Quad: AROM, Left, 5 reps, Seated ?Knee Flexion: AAROM, Left, 10 reps, Seated ?Goniometric ROM: 10-90* AAROM L knee ? ?  ?General Comments   ?  ?  ? ?Pertinent Vitals/Pain Pain Assessment ?Pain Assessment: 0-10 ?Pain Score: 5  ?Pain Location: L knee ?Pain  Descriptors / Indicators: Sore ?Pain Intervention(s): Limited activity within patient's tolerance, Monitored during session, Premedicated before session, Ice applied  ? ? ?Home Living   ?  ?  ?  ?  ?  ?  ?  ?  ?  ?   ?  ?Prior Function    ?  ?  ?   ? ?PT Goals (current goals can now be found in the care plan section) Acute Rehab PT Goals ?Patient Stated Goal: Golfing and try pickleball ?PT Goal Formulation: With patient ?Time For Goal Achievement: 12/09/21 ?Potential to Achieve Goals: Good ?Progress towards PT goals: Progressing toward goals ? ?  ?Frequency ? ? ? 7X/week ? ? ? ?  ?PT Plan    ? ? ?Co-evaluation   ?  ?  ?  ?  ? ?  ?AM-PAC PT "6 Clicks" Mobility   ?Outcome Measure ? Help needed turning from your back to your side while in a flat bed without using bedrails?: None ?Help needed moving from lying on your back to sitting on the side of a flat bed without using bedrails?: A Little ?Help needed moving to and from a bed to a chair (including a wheelchair)?: A Little ?Help needed standing up from a chair using your arms (e.g., wheelchair or bedside chair)?: A Little ?Help needed to walk in hospital room?: A Little ?Help needed climbing 3-5 steps with a railing? : A Little ?6 Click Score: 19 ? ?  ?End of Session Equipment Utilized During Treatment: Gait belt ?Activity Tolerance: Patient tolerated treatment well ?Patient left: in chair;with call bell/phone within reach;with chair alarm set;with family/visitor present;with SCD's reapplied ?Nurse Communication: Mobility status ?PT Visit Diagnosis: Pain;Difficulty in walking, not elsewhere classified (R26.2) ?Pain - Right/Left: Left ?Pain - part of body: Knee ?  ? ? ?Time: 3329-5188 ?PT Time Calculation (min) (ACUTE ONLY): 50 min ? ?Charges:  $Gait Training: 23-37 mins ?$Therapeutic Exercise: 8-22 mins          ?          ?Blondell Reveal Kistler PT 12/03/2021  ?Acute Rehabilitation Services ?Pager [phone] ?Office [phone] ? ? ?

## 2021-12-03 NOTE — Progress Notes (Signed)
Discharge instructions given to patient and patient's spouse.  Education emphasized on surgical incision site care, signs of infection to watch for, activities per PT, and pain management.  Both verbalized understanding.  Encouraged to call the doctor for questions.  Discharged home. ?

## 2021-12-05 DIAGNOSIS — M25562 Pain in left knee: Secondary | ICD-10-CM | POA: Diagnosis not present

## 2021-12-08 DIAGNOSIS — M25562 Pain in left knee: Secondary | ICD-10-CM | POA: Diagnosis not present

## 2021-12-09 NOTE — Discharge Summary (Signed)
Patient ID: ?Tracey [name] ?MRN: [id number] ?DOB/AGE: 03/25/1946 76 y.o. ? ?Admit date: 12/02/2021 ?Discharge date: 12/03/2021 ? ?Admission Diagnoses:  ?Left knee osteoarthritis ? ?Discharge Diagnoses:  ?Principal Problem: ?  S/P total knee arthroplasty, left ? ? ?Past Medical History:  ?Diagnosis Date  ? Allergy   ? Anxiety   ? takes Ativan daily prn  ? Arthritis   ? Breast cancer ([place]) 2014  ? ER+/PR+/HEr2-,   ? Bruises easily   ? Colon polyps   ? 2 polyps by report  ? Coronary artery disease involving native coronary artery of native heart without angina pectoris 01/01/2014  ? NSTEMI in January 2019 in [place]>> s/p DES x2 to the LAD (procedure complicated by stent thrombosis) Myoview 05/2020: EF 72, no infarct or ischemia; low risk Echocardiogram 05/2019: EF 60-65, GLS -21.5 Cardiac catheterization at [facility] in 08/2017: Normal left main, normal LCx, normal RCA; LAD/diagonal 95%>> PCI   ? Diverticulosis   ? GERD (gastroesophageal reflux disease)   ? occasionally takes Nexium   ? Gout   ? takes Allopurinol daily and Colchicine daily prn;last attack 25yr ago  ? Gout   ? History of bladder infections   ? many yrs ago  ? History of bronchitis   ? last time at least 855yrago  ? History of stress incontinence   ? Hyperlipidemia   ? takes Pravastatin daily  ? Hypertension   ? Insomnia   ? takes Ambien nightly prn  ? [place] [place]  ? OSA (obstructive sleep apnea)   ? on CPAP  ? Osteoarthritis   ? Peripheral edema   ? takes Furosemide daily prn  ? PONV (postoperative nausea and vomiting)   ? Postmenopausal hormone therapy   ? Radiation 07/27/13-09/07/13  ? Right Breast x 31 treatments  ? Rhinitis   ? uses Flonase prn  ? Sinus congestion   ? Status post breast reconstruction 10/15/2014  ? [place]  ? Tachycardia   ? takes Metoprolol daily  ? Ventricular tachycardia, non-sustained (HCC)   ? during sleep  study 2009 with normal cardiac workup  ? ? ?Surgeries: Procedure(s): ?TOTAL KNEE ARTHROPLASTY on 12/02/2021 ?  ?Consultants:  ? ?Discharged Condition: Improved ? ?Hospital Course: Tracey [name] an [age].o. female who was admitted 12/02/2021 for operative treatment ofS/P total knee arthroplasty, left. Patient has severe unremitting pain that affects sleep, daily activities, and work/hobbies. After pre-op clearance the patient was taken to the operating room on 12/02/2021 and underwent  Procedure(s): ?TOTAL KNEE ARTHROPLASTY.   ? ?Patient was given perioperative antibiotics:  ?Anti-infectives (From admission, onward)  ? ? Start     Dose/Rate Route Frequency Ordered Stop  ? 12/02/21 1830  ceFAZolin (ANCEF) IVPB 2g/100 mL premix       ? 2 g ?200 mL/hr over 30 Minutes Intravenous Every 6 hours 12/02/21 1632 12/03/21 0948  ? 12/02/21 1030  ceFAZolin (ANCEF) IVPB 2g/100 mL premix       ? 2 g ?200 mL/hr over 30 Minutes Intravenous On call to O.R. 12/02/21 1029 12/02/21 1300  ? ?  ?  ? ?Patient was given sequential compression devices, early ambulation, and chemoprophylaxis to prevent DVT. Patient worked with PT and was meeting their goals regarding safe ambulation and transfers. ? ?Patient benefited maximally from hospital stay and there were no complications.   ? ?Recent vital signs: No data found.  ? ?Recent laboratory studies:  No results for input(s): WBC, HGB, HCT, PLT, NA, K, CL, CO2, BUN, CREATININE, GLUCOSE, INR, CALCIUM in the last 72 hours. ? ?Invalid input(s): PT, 2 ? ? ?Discharge Medications:   ?Allergies as of 12/03/2021   ? ?   Reactions  ? Pseudoeph-hydrocodone-gg Other (See Comments)  ? Ivp Dye [iodinated Contrast Media] Hives  ? Sulfa Antibiotics Swelling  ? Tape Other (See Comments)  ? Unknown; has paper thin skin ; ok to use paper tape   ? ?  ? ?  ?Medication List  ?  ? ?STOP taking these medications   ? ?celecoxib 200 MG capsule ?Commonly known as: CELEBREX ?  ?phentermine 37.5 MG tablet ?Commonly known  as: ADIPEX-P ?  ? ?  ? ?TAKE these medications   ? ?allopurinol 100 MG tablet ?Commonly known as: ZYLOPRIM ?TAKE 1 TABLET BY MOUTH ONCE DAILY ?  ?amoxicillin 500 MG tablet ?Commonly known as: AMOXIL ?Take 2,000 mg by mouth as directed. Take 1 hr before dental appt ?  ?apraclonidine 0.5 % ophthalmic solution ?Commonly known as: IOPIDINE ?Place 1 drop into the right eye daily as needed (for droopy eye). ?  ?Aspirin Low Dose 81 MG chewable tablet ?Generic drug: aspirin ?Chew 81 mg by mouth daily. ?  ?clopidogrel 75 MG tablet ?Commonly known as: PLAVIX ?Take 1 tablet (75 mg total) by mouth daily. ?  ?colchicine 0.6 MG tablet ?TAKE 1 TABLET BY MOUTH EVERY DAY OR AS NEEDED ?What changed: See the new instructions. ?  ?cyanocobalamin 1000 MCG/ML injection ?Commonly known as: (VITAMIN B-12) ?Inject 1,000 mcg into the muscle every 30 (thirty) days. ?  ?cyclobenzaprine 10 MG tablet ?Commonly known as: FLEXERIL ?Take 1 tablet (10 mg total) by mouth 3 (three) times daily as needed for muscle spasms. ?  ?docusate sodium 100 MG capsule ?Commonly known as: Colace ?Take 1 capsule (100 mg total) by mouth 2 (two) times daily. ?What changed:  ?when to take this ?reasons to take this ?  ?ezetimibe 10 MG tablet ?Commonly known as: ZETIA ?TAKE 1 TABLET(10 MG) BY MOUTH DAILY ?What changed: See the new instructions. ?  ?fluticasone 50 MCG/ACT nasal spray ?Commonly known as: FLONASE ?Place 1 spray into both nostrils daily. ?  ?furosemide 40 MG tablet ?Commonly known as: LASIX ?Take 40 mg by mouth daily as needed for fluid. ?  ?lisinopril 10 MG tablet ?Commonly known as: ZESTRIL ?TAKE 1 TABLET(10 MG) BY MOUTH DAILY ?What changed: See the new instructions. ?  ?loratadine 10 MG tablet ?Commonly known as: CLARITIN ?Take 10 mg by mouth daily. ?  ?metoprolol succinate 25 MG 24 hr tablet ?Commonly known as: TOPROL-XL ?TAKE 1 TABLET(25 MG) BY MOUTH DAILY ?What changed: See the new instructions. ?  ?Multi-Vitamin tablet ?Take 1 tablet by mouth  daily. ?  ?nitroGLYCERIN 0.4 MG SL tablet ?Commonly known as: NITROSTAT ?Place 1 tablet (0.4 mg total) under the tongue every 5 (five) minutes as needed for chest pain. ?  ?NONFORMULARY OR COMPOUNDED ITEM ?Vitamin E vaginal cream 200u/ml.  One ml pv three times weekly. ?  ?oxyCODONE 5 MG immediate release tablet ?Commonly known as: Oxy IR/ROXICODONE ?Take 1-2 tablets (5-10 mg total) by mouth every 4 (four) hours as needed for severe pain. ?  ?pantoprazole 40 MG tablet ?Commonly known as: PROTONIX ?TAKE 1 TABLET(40 MG) BY MOUTH DAILY ?What changed: See the new instructions. ?  ?polyethylene glycol 17 g packet ?Commonly known as: MIRALAX / GLYCOLAX ?Take 17 g by mouth daily as needed for mild constipation. ?  ?  rosuvastatin 20 MG tablet ?Commonly known as: CRESTOR ?Take 1 tablet (20 mg total) by mouth daily. ?  ?sertraline 100 MG tablet ?Commonly known as: ZOLOFT ?Take 100 mg by mouth daily. ?  ?testosterone cypionate 200 MG/ML injection ?Commonly known as: DEPOTESTOSTERONE CYPIONATE ?Inject 200 mg into the muscle every 28 (twenty-eight) days. ?  ?Vicks DayQuil/NyQuil Cough 6.25-15 & 15 MG/15ML Lqpk ?Generic drug: Doxylamine-DM & DM ?Take 15 mLs by mouth every 6 (six) hours as needed (allergy symptoms). ?  ?Vitamin D3 50 MCG (2000 UT) Tabs ?Take 2,000 Units by mouth daily. ?  ? ?  ? ?  ?  ? ? ?  ?Discharge Care Instructions  ?(From admission, onward)  ?  ? ? ?  ? ?  Start     Ordered  ? 12/03/21 0000  Change dressing       ?Comments: Maintain surgical dressing until follow up in the clinic. If the edges start to pull up, may reinforce with tape. If the dressing is no longer working, may remove and cover with gauze and tape, but must keep the area dry and clean.  Call with any questions or concerns.  ? 12/03/21 0800  ? ?  ?  ? ?  ? ? ?Diagnostic Studies: No results found. ? ?Disposition: Discharge disposition: 01-Home or Self Care ? ? ? ? ? ? ?Discharge Instructions   ? ? Call MD / Call 911   Complete by: As directed ?   ? If you experience chest pain or shortness of breath, CALL 911 and be transported to the hospital emergency room.  If you develope a fever above 101 F, pus (white drainage) or increased drainage or redness at t

## 2021-12-10 ENCOUNTER — Ambulatory Visit: Payer: Medicare Other | Admitting: Neurology

## 2021-12-10 DIAGNOSIS — M25562 Pain in left knee: Secondary | ICD-10-CM | POA: Diagnosis not present

## 2021-12-12 ENCOUNTER — Ambulatory Visit (HOSPITAL_COMMUNITY)
Admission: RE | Admit: 2021-12-12 | Discharge: 2021-12-12 | Disposition: A | Discharge status: Home / Self Care | Payer: Medicare Other | Source: Ambulatory Visit | Attending: Cardiology | Admitting: Cardiology

## 2021-12-12 ENCOUNTER — Other Ambulatory Visit (HOSPITAL_COMMUNITY): Payer: Self-pay | Admitting: Physician Assistant

## 2021-12-12 DIAGNOSIS — M7989 Other specified soft tissue disorders: Secondary | ICD-10-CM

## 2021-12-15 DIAGNOSIS — M25562 Pain in left knee: Secondary | ICD-10-CM | POA: Diagnosis not present

## 2021-12-17 DIAGNOSIS — M25562 Pain in left knee: Secondary | ICD-10-CM | POA: Diagnosis not present

## 2021-12-19 DIAGNOSIS — M25562 Pain in left knee: Secondary | ICD-10-CM | POA: Diagnosis not present

## 2021-12-22 DIAGNOSIS — M25562 Pain in left knee: Secondary | ICD-10-CM | POA: Diagnosis not present

## 2021-12-24 DIAGNOSIS — M25562 Pain in left knee: Secondary | ICD-10-CM | POA: Diagnosis not present

## 2021-12-25 ENCOUNTER — Ambulatory Visit: Payer: Medicare Other | Admitting: Neurology

## 2021-12-29 ENCOUNTER — Ambulatory Visit (HOSPITAL_BASED_OUTPATIENT_CLINIC_OR_DEPARTMENT_OTHER)
Admission: RE | Admit: 2021-12-29 | Discharge: 2021-12-29 | Disposition: A | Discharge status: Home / Self Care | Payer: Medicare Other | Source: Ambulatory Visit | Attending: Internal Medicine | Admitting: Internal Medicine

## 2021-12-29 ENCOUNTER — Inpatient Hospital Stay (HOSPITAL_BASED_OUTPATIENT_CLINIC_OR_DEPARTMENT_OTHER)
Admission: EM | Admit: 2021-12-29 | Discharge: 2021-12-31 | DRG: 684 | Disposition: A | Discharge status: Home / Self Care | Payer: Medicare Other | Attending: Internal Medicine | Admitting: Internal Medicine

## 2021-12-29 ENCOUNTER — Encounter (HOSPITAL_BASED_OUTPATIENT_CLINIC_OR_DEPARTMENT_OTHER): Payer: Self-pay | Admitting: Obstetrics and Gynecology

## 2021-12-29 ENCOUNTER — Other Ambulatory Visit: Payer: Self-pay | Admitting: Internal Medicine

## 2021-12-29 ENCOUNTER — Other Ambulatory Visit: Payer: Self-pay

## 2021-12-29 ENCOUNTER — Other Ambulatory Visit (HOSPITAL_BASED_OUTPATIENT_CLINIC_OR_DEPARTMENT_OTHER): Payer: Self-pay | Admitting: Internal Medicine

## 2021-12-29 ENCOUNTER — Emergency Department (HOSPITAL_BASED_OUTPATIENT_CLINIC_OR_DEPARTMENT_OTHER): Payer: Medicare Other

## 2021-12-29 ENCOUNTER — Encounter (HOSPITAL_BASED_OUTPATIENT_CLINIC_OR_DEPARTMENT_OTHER): Payer: Self-pay

## 2021-12-29 DIAGNOSIS — R0609 Other forms of dyspnea: Secondary | ICD-10-CM | POA: Diagnosis not present

## 2021-12-29 DIAGNOSIS — Z9049 Acquired absence of other specified parts of digestive tract: Secondary | ICD-10-CM

## 2021-12-29 DIAGNOSIS — R5383 Other fatigue: Secondary | ICD-10-CM | POA: Diagnosis not present

## 2021-12-29 DIAGNOSIS — N183 Chronic kidney disease, stage 3 unspecified: Secondary | ICD-10-CM | POA: Diagnosis present

## 2021-12-29 DIAGNOSIS — R7303 Prediabetes: Secondary | ICD-10-CM | POA: Diagnosis present

## 2021-12-29 DIAGNOSIS — R509 Fever, unspecified: Secondary | ICD-10-CM | POA: Diagnosis not present

## 2021-12-29 DIAGNOSIS — Z79899 Other long term (current) drug therapy: Secondary | ICD-10-CM

## 2021-12-29 DIAGNOSIS — Z8249 Family history of ischemic heart disease and other diseases of the circulatory system: Secondary | ICD-10-CM | POA: Diagnosis not present

## 2021-12-29 DIAGNOSIS — Z853 Personal history of malignant neoplasm of breast: Secondary | ICD-10-CM | POA: Diagnosis not present

## 2021-12-29 DIAGNOSIS — Z7982 Long term (current) use of aspirin: Secondary | ICD-10-CM

## 2021-12-29 DIAGNOSIS — Z87891 Personal history of nicotine dependence: Secondary | ICD-10-CM

## 2021-12-29 DIAGNOSIS — Z9109 Other allergy status, other than to drugs and biological substances: Secondary | ICD-10-CM

## 2021-12-29 DIAGNOSIS — E785 Hyperlipidemia, unspecified: Secondary | ICD-10-CM | POA: Diagnosis present

## 2021-12-29 DIAGNOSIS — M545 Low back pain, unspecified: Secondary | ICD-10-CM | POA: Diagnosis present

## 2021-12-29 DIAGNOSIS — F32A Depression, unspecified: Secondary | ICD-10-CM | POA: Diagnosis present

## 2021-12-29 DIAGNOSIS — Z96652 Presence of left artificial knee joint: Secondary | ICD-10-CM

## 2021-12-29 DIAGNOSIS — I700 Atherosclerosis of aorta: Secondary | ICD-10-CM | POA: Diagnosis not present

## 2021-12-29 DIAGNOSIS — R079 Chest pain, unspecified: Secondary | ICD-10-CM | POA: Diagnosis not present

## 2021-12-29 DIAGNOSIS — Z1152 Encounter for screening for COVID-19: Secondary | ICD-10-CM | POA: Diagnosis not present

## 2021-12-29 DIAGNOSIS — Z955 Presence of coronary angioplasty implant and graft: Secondary | ICD-10-CM | POA: Diagnosis not present

## 2021-12-29 DIAGNOSIS — G4733 Obstructive sleep apnea (adult) (pediatric): Secondary | ICD-10-CM | POA: Diagnosis present

## 2021-12-29 DIAGNOSIS — R1310 Dysphagia, unspecified: Secondary | ICD-10-CM | POA: Diagnosis not present

## 2021-12-29 DIAGNOSIS — K219 Gastro-esophageal reflux disease without esophagitis: Secondary | ICD-10-CM | POA: Diagnosis present

## 2021-12-29 DIAGNOSIS — Z803 Family history of malignant neoplasm of breast: Secondary | ICD-10-CM | POA: Diagnosis not present

## 2021-12-29 DIAGNOSIS — Z885 Allergy status to narcotic agent status: Secondary | ICD-10-CM

## 2021-12-29 DIAGNOSIS — Z9882 Breast implant status: Secondary | ICD-10-CM

## 2021-12-29 DIAGNOSIS — R112 Nausea with vomiting, unspecified: Secondary | ICD-10-CM | POA: Diagnosis not present

## 2021-12-29 DIAGNOSIS — C50411 Malignant neoplasm of upper-outer quadrant of right female breast: Secondary | ICD-10-CM | POA: Diagnosis present

## 2021-12-29 DIAGNOSIS — R0602 Shortness of breath: Secondary | ICD-10-CM | POA: Diagnosis not present

## 2021-12-29 DIAGNOSIS — D649 Anemia, unspecified: Secondary | ICD-10-CM | POA: Diagnosis not present

## 2021-12-29 DIAGNOSIS — Z90710 Acquired absence of both cervix and uterus: Secondary | ICD-10-CM

## 2021-12-29 DIAGNOSIS — E860 Dehydration: Secondary | ICD-10-CM | POA: Diagnosis present

## 2021-12-29 DIAGNOSIS — Z96653 Presence of artificial knee joint, bilateral: Secondary | ICD-10-CM | POA: Diagnosis present

## 2021-12-29 DIAGNOSIS — N179 Acute kidney failure, unspecified: Principal | ICD-10-CM | POA: Diagnosis present

## 2021-12-29 DIAGNOSIS — Z9841 Cataract extraction status, right eye: Secondary | ICD-10-CM

## 2021-12-29 DIAGNOSIS — Z7902 Long term (current) use of antithrombotics/antiplatelets: Secondary | ICD-10-CM

## 2021-12-29 DIAGNOSIS — I10 Essential (primary) hypertension: Secondary | ICD-10-CM | POA: Diagnosis present

## 2021-12-29 DIAGNOSIS — K6389 Other specified diseases of intestine: Secondary | ICD-10-CM | POA: Diagnosis not present

## 2021-12-29 DIAGNOSIS — R1314 Dysphagia, pharyngoesophageal phase: Secondary | ICD-10-CM | POA: Diagnosis present

## 2021-12-29 DIAGNOSIS — I129 Hypertensive chronic kidney disease with stage 1 through stage 4 chronic kidney disease, or unspecified chronic kidney disease: Secondary | ICD-10-CM | POA: Diagnosis present

## 2021-12-29 DIAGNOSIS — I2510 Atherosclerotic heart disease of native coronary artery without angina pectoris: Secondary | ICD-10-CM | POA: Diagnosis present

## 2021-12-29 DIAGNOSIS — K449 Diaphragmatic hernia without obstruction or gangrene: Secondary | ICD-10-CM | POA: Diagnosis not present

## 2021-12-29 DIAGNOSIS — R1013 Epigastric pain: Secondary | ICD-10-CM | POA: Diagnosis not present

## 2021-12-29 DIAGNOSIS — Z91041 Radiographic dye allergy status: Secondary | ICD-10-CM

## 2021-12-29 DIAGNOSIS — I252 Old myocardial infarction: Secondary | ICD-10-CM | POA: Diagnosis not present

## 2021-12-29 DIAGNOSIS — Z9842 Cataract extraction status, left eye: Secondary | ICD-10-CM

## 2021-12-29 DIAGNOSIS — R0789 Other chest pain: Secondary | ICD-10-CM | POA: Diagnosis not present

## 2021-12-29 DIAGNOSIS — K224 Dyskinesia of esophagus: Secondary | ICD-10-CM | POA: Diagnosis not present

## 2021-12-29 DIAGNOSIS — M25562 Pain in left knee: Secondary | ICD-10-CM | POA: Diagnosis not present

## 2021-12-29 DIAGNOSIS — Z882 Allergy status to sulfonamides status: Secondary | ICD-10-CM

## 2021-12-29 DIAGNOSIS — K5730 Diverticulosis of large intestine without perforation or abscess without bleeding: Secondary | ICD-10-CM | POA: Diagnosis not present

## 2021-12-29 DIAGNOSIS — Z9013 Acquired absence of bilateral breasts and nipples: Secondary | ICD-10-CM | POA: Diagnosis not present

## 2021-12-29 DIAGNOSIS — R071 Chest pain on breathing: Secondary | ICD-10-CM | POA: Diagnosis not present

## 2021-12-29 DIAGNOSIS — Z79890 Hormone replacement therapy (postmenopausal): Secondary | ICD-10-CM

## 2021-12-29 DIAGNOSIS — M109 Gout, unspecified: Secondary | ICD-10-CM | POA: Diagnosis present

## 2021-12-29 LAB — BASIC METABOLIC PANEL
Anion gap: 11 (ref 5–15)
BUN: 41 mg/dL — ABNORMAL HIGH (ref 8–23)
CO2: 23 mmol/L (ref 22–32)
Calcium: 9.6 mg/dL (ref 8.9–10.3)
Chloride: 103 mmol/L (ref 98–111)
Creatinine, Ser: 1.83 mg/dL — ABNORMAL HIGH (ref 0.44–1.00)
GFR, Estimated: 28 mL/min — ABNORMAL LOW (ref >60)
Glucose, Bld: 75 mg/dL (ref 70–99)
Potassium: 4.6 mmol/L (ref 3.5–5.1)
Sodium: 137 mmol/L (ref 135–145)

## 2021-12-29 LAB — HEPATIC FUNCTION PANEL
ALT: 17 U/L (ref 0–44)
AST: 20 U/L (ref 15–41)
Albumin: 4.4 g/dL (ref 3.5–5.0)
Alkaline Phosphatase: 70 U/L (ref 38–126)
Bilirubin, Direct: 0.1 mg/dL (ref 0.0–0.2)
Indirect Bilirubin: 0.4 mg/dL (ref 0.3–0.9)
Total Bilirubin: 0.5 mg/dL (ref 0.3–1.2)
Total Protein: 7 g/dL (ref 6.5–8.1)

## 2021-12-29 LAB — TROPONIN I (HIGH SENSITIVITY)
Troponin I (High Sensitivity): 4 ng/L (ref <18)
Troponin I (High Sensitivity): 4 ng/L (ref <18)

## 2021-12-29 LAB — CBC
HCT: 35.8 % — ABNORMAL LOW (ref 36.0–46.0)
Hemoglobin: 11.7 g/dL — ABNORMAL LOW (ref 12.0–15.0)
MCH: 29.9 pg (ref 26.0–34.0)
MCHC: 32.7 g/dL (ref 30.0–36.0)
MCV: 91.6 fL (ref 80.0–100.0)
Platelets: 252 10*3/uL (ref 150–400)
RBC: 3.91 MIL/uL (ref 3.87–5.11)
RDW: 13.7 % (ref 11.5–15.5)
WBC: 6.2 10*3/uL (ref 4.0–10.5)
nRBC: 0 % (ref 0.0–0.2)

## 2021-12-29 LAB — PROTIME-INR
INR: 1 (ref 0.8–1.2)
Prothrombin Time: 13.3 seconds (ref 11.4–15.2)

## 2021-12-29 LAB — BRAIN NATRIURETIC PEPTIDE: B Natriuretic Peptide: 32.1 pg/mL (ref 0.0–100.0)

## 2021-12-29 IMAGING — CT CT ANGIO CHEST
2 of 7 series · 17 of 46 positions shown · IV contrast (agent unspecified)
Comparison: CT angiogram chest [DATE].

CLINICAL DATA: High probability for pulmonary embolism. Chest pain
and shortness of breath.

EXAM:
CT ANGIOGRAPHY CHEST WITH CONTRAST
TECHNIQUE: Multidetector CT imaging of the chest was performed using the
standard protocol during bolus administration of intravenous
contrast. Multiplanar CT image reconstructions and MIPs were
obtained to evaluate the vascular anatomy.

[Series 5: pe axial thins · axial · 0.63mm/px · z∈[+975,+1205]mm · 14 of 266 slices shown]
[im 18/266  lung]
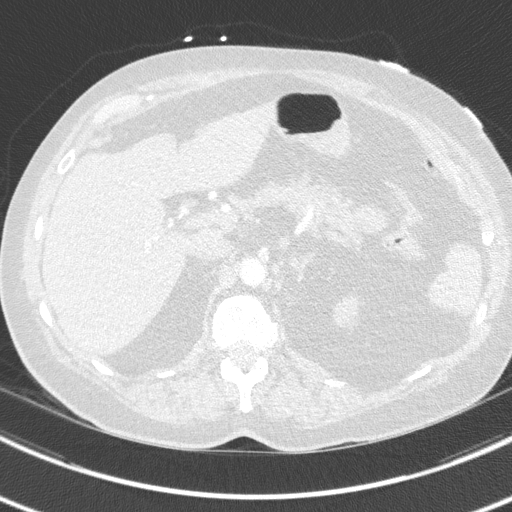
[im 36/266  soft-tissue]
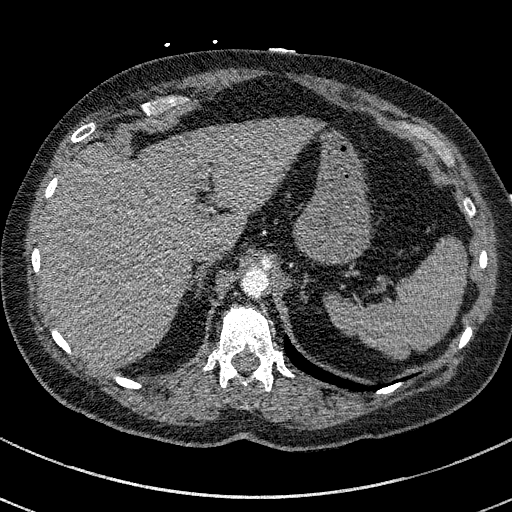
[im 54/266  lung]
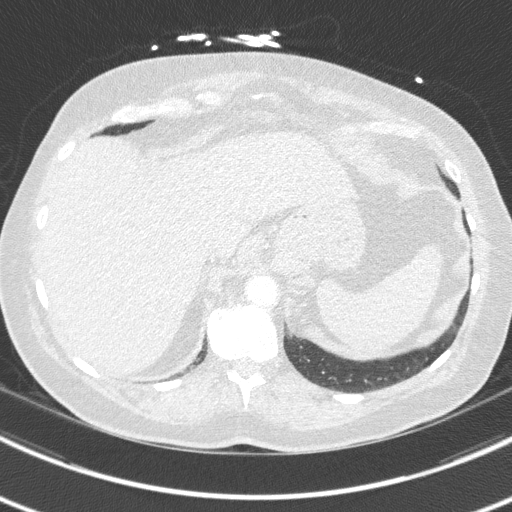
[im 71/266  soft-tissue]
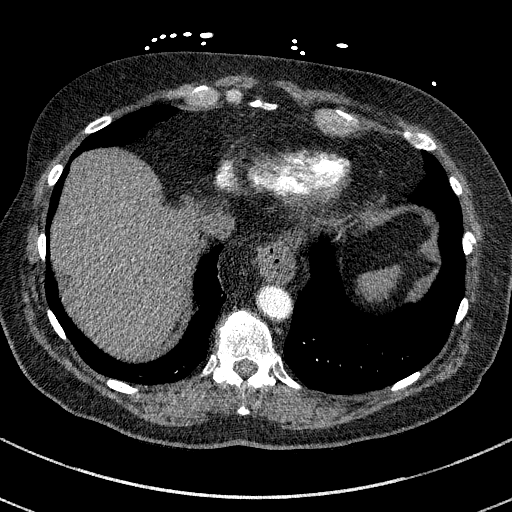
[im 89/266  lung]
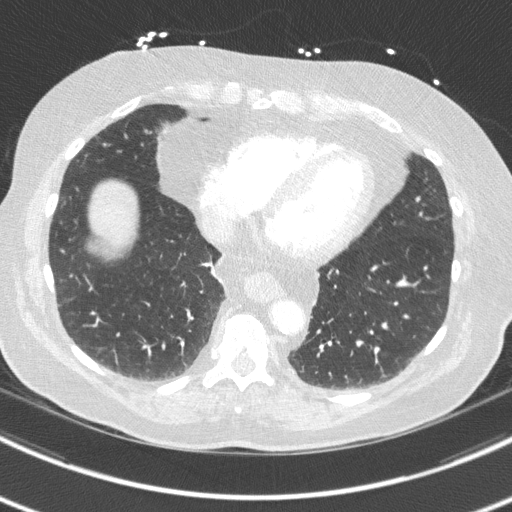
[im 107/266  soft-tissue]
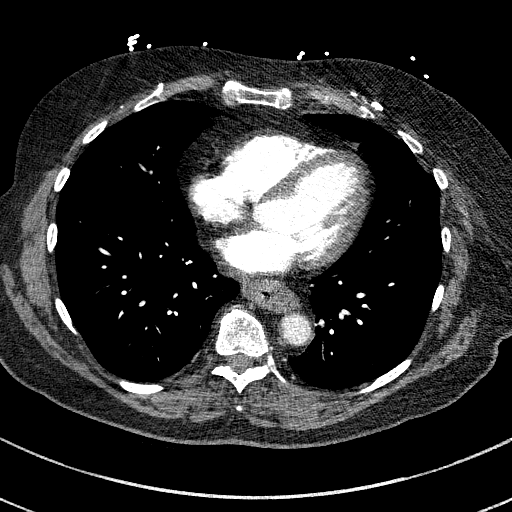
[im 124/266  lung]
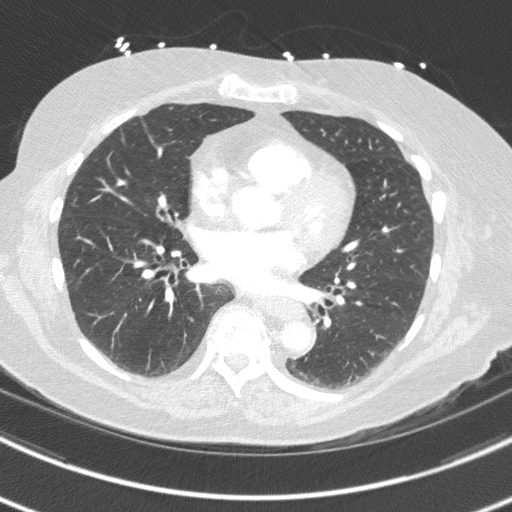
[im 142/266  soft-tissue]
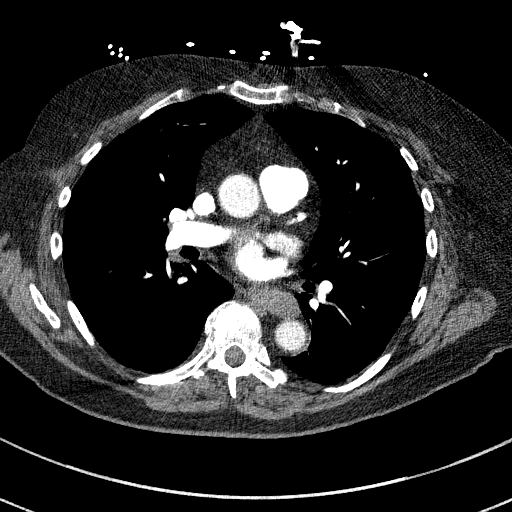
[im 160/266  lung]
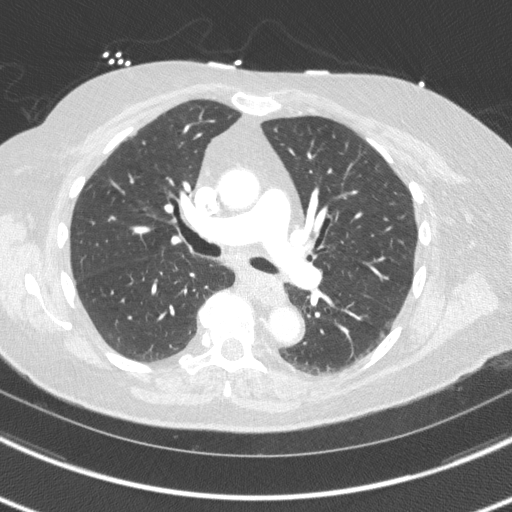
[im 177/266  soft-tissue]
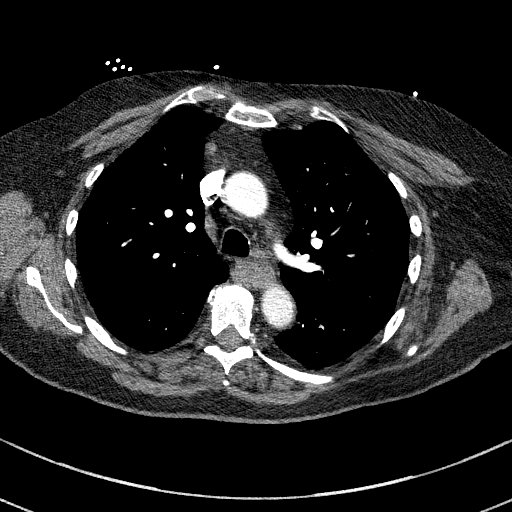
[im 195/266  lung]
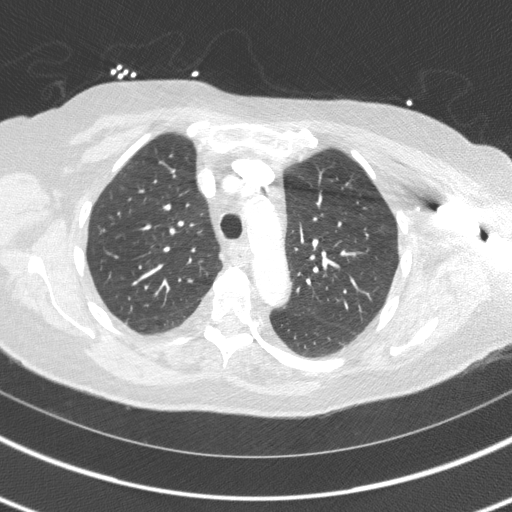
[im 213/266  soft-tissue]
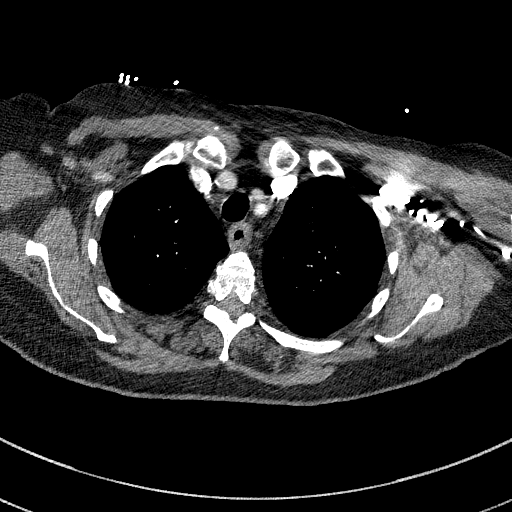
[im 230/266  lung]
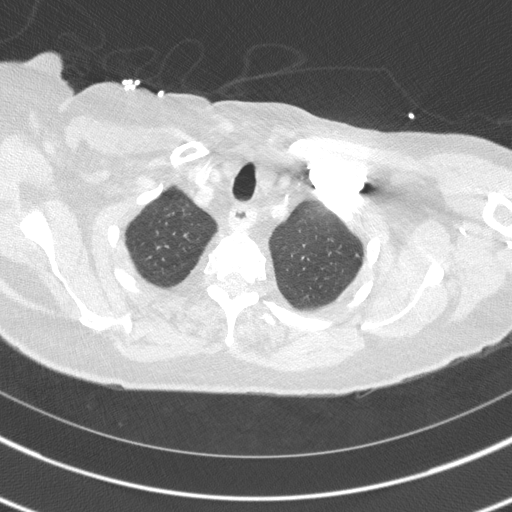
[im 248/266  soft-tissue]
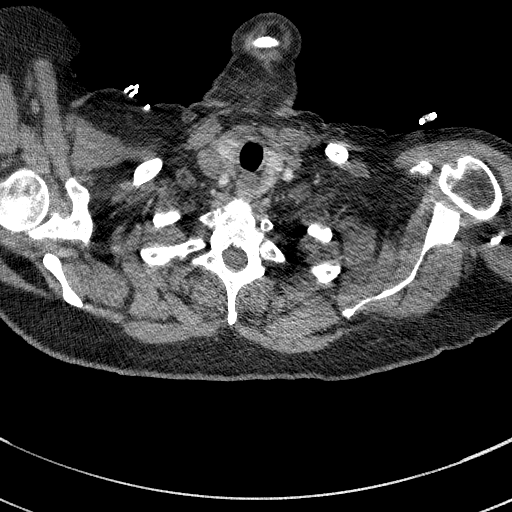

[Series 7: cor soft · coronal · 0.54mm/px · 3 of 127 slices shown]
[im 32/127  soft-tissue]
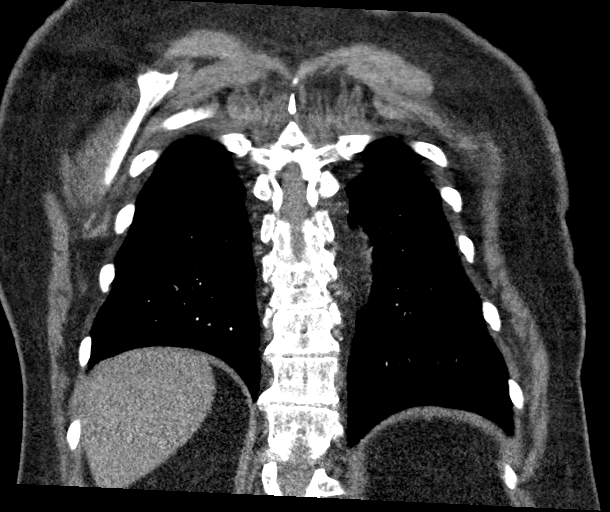
[im 64/127  soft-tissue]
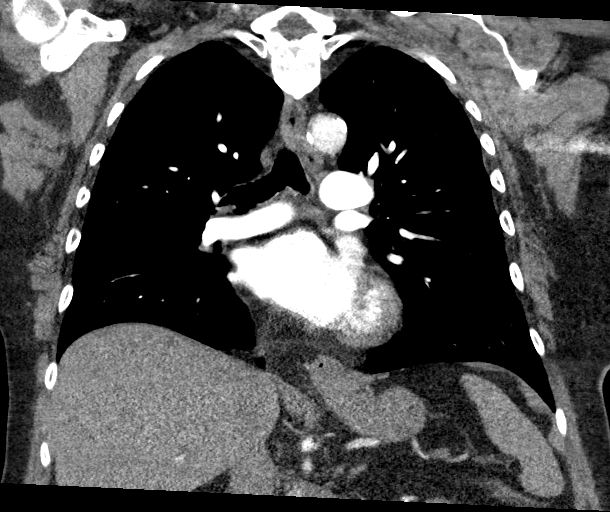
[im 95/127  soft-tissue]
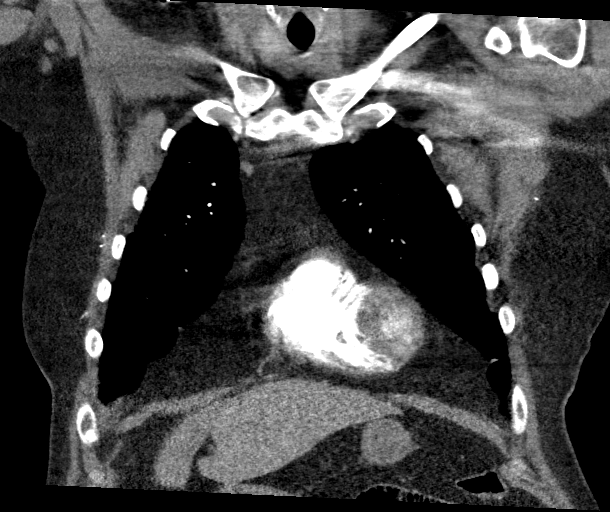

[17 of 46 positions shown; findings below may reference images not displayed]

RADIATION DOSE REDUCTION: This exam was performed according to the
departmental dose-optimization program which includes automated
exposure control, adjustment of the mA and/or kV according to
patient size and/or use of iterative reconstruction technique.

CONTRAST:  60mL OMNIPAQUE IOHEXOL 350 MG/ML SOLN
FINDINGS: Cardiovascular: Satisfactory opacification of the pulmonary arteries
to the segmental level. No evidence of pulmonary embolism. Normal
heart size. No pericardial effusion. There are atherosclerotic
calcifications of the aorta.

Mediastinum/Nodes: Small hiatal hernia is present. There is mild
diffuse esophageal wall thickening similar to the prior study.

Lungs/Pleura: There is a stable 4 mm ground-glass nodule in the left
upper lobe image 6/38. There is no focal lung infiltrate, pleural
effusion or pneumothorax. There is minimal pleural scarring in the
anterior right upper lobe similar to the prior study. There are 2
adjacent nodules in the left lower lobe measuring up to 4 mm image
6/85 which are unchanged from prior study.

Upper Abdomen: Cholecystectomy clips are present.

Musculoskeletal: There are subacute appearing left sixth, seventh
and eighth rib fractures. Multilevel degenerative changes affect the
spine. Right axillary and chest wall surgical clips are present.

Review of the MIP images confirms the above findings.
IMPRESSION: 1. No evidence for pulmonary embolism.
2. Nodular densities measuring up to 4 mm are unchanged from [HD]
favored as benign.
3. Subacute left sixth, seventh and eighth rib fractures. Correlate
clinically.

Aortic Atherosclerosis ([HD]-[HD]).

## 2021-12-29 MED ORDER — LACTATED RINGERS IV BOLUS
1000.0000 mL | Freq: Once | INTRAVENOUS | Status: AC
  Administered 2021-12-29: 1000 mL via INTRAVENOUS

## 2021-12-29 MED ORDER — IOHEXOL 350 MG/ML SOLN
100.0000 mL | Freq: Once | INTRAVENOUS | Status: AC | PRN
  Administered 2021-12-29: 58 mL via INTRAVENOUS

## 2021-12-29 MED ORDER — METHYLPREDNISOLONE SODIUM SUCC 40 MG IJ SOLR
40.0000 mg | Freq: Once | INTRAMUSCULAR | Status: AC
  Administered 2021-12-29: 40 mg via INTRAVENOUS
  Filled 2021-12-29: qty 1

## 2021-12-29 MED ORDER — DIPHENHYDRAMINE HCL 25 MG PO CAPS: 50.0000 mg | ORAL_CAPSULE | Freq: Once | ORAL | Status: AC

## 2021-12-29 MED ORDER — LACTATED RINGERS IV SOLN: INTRAVENOUS | Status: DC

## 2021-12-29 MED ORDER — DIPHENHYDRAMINE HCL 50 MG/ML IJ SOLN
50.0000 mg | Freq: Once | INTRAMUSCULAR | Status: AC
  Administered 2021-12-29: 50 mg via INTRAVENOUS
  Filled 2021-12-29: qty 1

## 2021-12-29 NOTE — ED Notes (Addendum)
Upon [facility] arrival, pt IV began to ring out "occluded". Assessed site and noted signs of infiltration. IV removed. [facility] RN to attempt to reestablish access. Bolus paused with 300cc remaining. ?

## 2021-12-29 NOTE — Discharge Instructions (Addendum)
Stop taking plavix on Saturday 5/20 or when advised by Dr Benson [place] office.  ?

## 2021-12-29 NOTE — ED Triage Notes (Signed)
Patient reports to the ER for Chest pain and shortness of breath. Patient had a knee replacement on the 18th of April and since Friday she has been having associated chest pain and shortness of breath with weakness and pain across her back.  ?

## 2021-12-29 NOTE — ED Notes (Signed)
Transported to CT 

## 2021-12-29 NOTE — ED Provider Notes (Signed)
?[place] EMERGENCY DEPT ?Provider Note ? ? ?CSN: [id number] ?Arrival date & time: 12/29/21  1618 ? ?  ? ?History ? ?Chief Complaint  ?Patient presents with  ? Shortness of Breath  ? ? ?Tracey [name] is a 76 y.o. female. ? ? ?Shortness of Breath ?Associated symptoms: chest pain and cough   ? ?76 year old female with medical history significant for arthritis, gout, HLD, breast cancer, anxiety, GERD, diverticulosis, OSA, status post breast reconstruction, CAD, HTN, recent left knee arthroplasty due to osteoarthritis with Dr. Alvan Dame on 12/02/2021 who presents to the emergency department due to concern for PE.  The patient was seen by her PCP earlier today.  Since Friday, she has had left-sided chest discomfort, shortness of breath, persistent dyspnea on exertion.  She denies any fevers or chills.  She endorses a dry nonproductive cough.  A week or so ago she had had left lower extremity swelling in her left ankle which is since resolved.  Her symptoms came on suddenly on Friday and persisted throughout the weekend.  She endorses persistent fatigue.  She takes Plavix but is not on anticoagulation.  She does take metoprolol. ? ?Home Medications ?Prior to Admission medications   ?Medication Sig Start Date End Date Taking? Authorizing Provider  ?allopurinol (ZYLOPRIM) 100 MG tablet TAKE 1 TABLET BY MOUTH ONCE DAILY ?Patient taking differently: Take 100 mg by mouth daily. 06/25/16   Magrinat, Virgie Dad, MD  ?amoxicillin (AMOXIL) 500 MG tablet Take 2,000 mg by mouth as directed. Take 1 hr before dental appt    [provider]  ?apraclonidine (IOPIDINE) 0.5 % ophthalmic solution Place 1 drop into the right eye daily as needed (for droopy eye).  07/13/18   [provider]  ?ASPIRIN LOW DOSE 81 MG chewable tablet Chew 81 mg by mouth daily.  09/19/17   [provider]  ?Cholecalciferol (VITAMIN D3) 50 MCG (2000 UT) TABS Take 2,000 Units by mouth daily.    [provider]  ?clopidogrel  (PLAVIX) 75 MG tablet Take 1 tablet (75 mg total) by mouth daily. 06/02/21   Loel Dubonnet, NP  ?colchicine 0.6 MG tablet TAKE 1 TABLET BY MOUTH EVERY DAY OR AS NEEDED ?Patient taking differently: Take 0.6 mg by mouth daily as needed (gout). 05/27/17   Magrinat, Virgie Dad, MD  ?cyanocobalamin (,VITAMIN B-12,) 1000 MCG/ML injection Inject 1,000 mcg into the muscle every 30 (thirty) days.    [provider]  ?cyclobenzaprine (FLEXERIL) 10 MG tablet Take 1 tablet (10 mg total) by mouth 3 (three) times daily as needed for muscle spasms. 12/09/20   Dohmeier, Asencion Partridge, MD  ?docusate sodium (COLACE) 100 MG capsule Take 1 capsule (100 mg total) by mouth 2 (two) times daily. ?Patient taking differently: Take 100 mg by mouth 2 (two) times daily as needed for mild constipation. 07/26/18   Danae Orleans, PA-C  ?Doxylamine-DM & DM (VICKS DAYQUIL/NYQUIL COUGH) 6.25-15 & 15 MG/15ML LQPK Take 15 mLs by mouth every 6 (six) hours as needed (allergy symptoms).    [provider]  ?ezetimibe (ZETIA) 10 MG tablet TAKE 1 TABLET(10 MG) BY MOUTH DAILY ?Patient taking differently: Take 10 mg by mouth daily. 09/05/21   Sherren Mocha, MD  ?fluticasone [facility]) 50 MCG/ACT nasal spray Place 1 spray into both nostrils daily. 01/18/13   [provider]  ?furosemide (LASIX) 40 MG tablet Take 40 mg by mouth daily as needed for fluid.  04/01/13   [provider]  ?lisinopril (ZESTRIL) 10 MG tablet TAKE 1 TABLET(10  MG) BY MOUTH DAILY ?Patient taking differently: Take 10 mg by mouth daily. 09/12/21   Sherren Mocha, MD  ?loratadine (CLARITIN) 10 MG tablet Take 10 mg by mouth daily.    [provider]  ?metoprolol succinate (TOPROL-XL) 25 MG 24 hr tablet TAKE 1 TABLET(25 MG) BY MOUTH DAILY ?Patient taking differently: Take 25 mg by mouth daily. 06/02/21   Sherren Mocha, MD  ?Multiple Vitamin (MULTI-VITAMIN) tablet Take 1 tablet by mouth daily.    [provider]  ?nitroGLYCERIN (NITROSTAT) 0.4 MG  SL tablet Place 1 tablet (0.4 mg total) under the tongue every 5 (five) minutes as needed for chest pain. 06/02/21   Loel Dubonnet, NP  ?NONFORMULARY OR COMPOUNDED ITEM Vitamin E vaginal cream 200u/ml.  One ml pv three times weekly. 09/11/19   Megan Salon, MD  ?oxyCODONE (OXY IR/ROXICODONE) 5 MG immediate release tablet Take 1-2 tablets (5-10 mg total) by mouth every 4 (four) hours as needed for severe pain. 12/03/21   Irving Copas, PA-C  ?pantoprazole (PROTONIX) 40 MG tablet TAKE 1 TABLET(40 MG) BY MOUTH DAILY ?Patient taking differently: Take 40 mg by mouth daily. 08/20/21   Loel Dubonnet, NP  ?polyethylene glycol (MIRALAX / GLYCOLAX) 17 g packet Take 17 g by mouth daily as needed for mild constipation. 12/03/21   Irving Copas, PA-C  ?rosuvastatin (CRESTOR) 20 MG tablet Take 1 tablet (20 mg total) by mouth daily. 06/02/21   Loel Dubonnet, NP  ?sertraline (ZOLOFT) 100 MG tablet Take 100 mg by mouth daily. 12/06/20   [provider]  ?testosterone cypionate (DEPOTESTOSTERONE CYPIONATE) 200 MG/ML injection Inject 200 mg into the muscle every 28 (twenty-eight) days. 01/17/19   [provider]  ?   ? ?Allergies    ?Pseudoeph-hydrocodone-gg, Ivp dye [iodinated contrast media], Sulfa antibiotics, and Tape   ? ?Review of Systems   ?Review of Systems  ?Respiratory:  Positive for cough and shortness of breath.   ?Cardiovascular:  Positive for chest pain and leg swelling.  ?All other systems reviewed and are negative. ? ?Physical Exam ?Updated Vital Signs ?BP (!) 127/57   Pulse 79   Temp 98 ?F (36.7 ?C) (Oral)   Resp 15   Ht '5\' 2"'$  (1.575 m)   Wt 62.1 kg   LMP 08/17/1977 (Approximate)   SpO2 100%   BMI 25.06 kg/m?  ?Physical Exam ?Vitals and nursing note reviewed.  ?Constitutional:   ?   General: She is not in acute distress. ?   Appearance: She is well-developed.  ?HENT:  ?   Head: Normocephalic and atraumatic.  ?Eyes:  ?   Conjunctiva/sclera: Conjunctivae normal.  ?Cardiovascular:   ?   Rate and Rhythm: Normal rate and regular rhythm.  ?   Heart sounds: No murmur heard. ?Pulmonary:  ?   Effort: Pulmonary effort is normal. No respiratory distress.  ?   Breath sounds: Normal breath sounds.  ?Abdominal:  ?   Palpations: Abdomen is soft.  ?   Tenderness: There is no abdominal tenderness.  ?Musculoskeletal:     ?   General: No swelling.  ?   Cervical back: Neck supple.  ?   Right lower leg: No tenderness. No edema.  ?   Left lower leg: No tenderness. No edema.  ?   Comments: Left knee total arthroplasty surgical scar appears to be well-healing, intact range of motion, distal DP pulses intact and 2+, no surrounding erythema, scab to the medial aspect of the proximal tibia with some  inflammatory changes, no significant tenderness or cellulitic changes  ?Skin: ?   General: Skin is warm and dry.  ?   Capillary Refill: Capillary refill takes less than 2 seconds.  ?Neurological:  ?   Mental Status: She is alert.  ?Psychiatric:     ?   Mood and Affect: Mood normal.  ? ? ?ED Results / Procedures / Treatments   ?Labs ?(all labs ordered are listed, but only abnormal results are displayed) ?Labs Reviewed  ?BASIC METABOLIC PANEL - Abnormal; Notable for the following components:  ?    Result Value  ? BUN 41 (*)   ? Creatinine, Ser 1.83 (*)   ? GFR, Estimated 28 (*)   ? All other components within normal limits  ?CBC - Abnormal; Notable for the following components:  ? Hemoglobin 11.7 (*)   ? HCT 35.8 (*)   ? All other components within normal limits  ?PROTIME-INR  ?HEPATIC FUNCTION PANEL  ?BRAIN NATRIURETIC PEPTIDE  ?TROPONIN I (HIGH SENSITIVITY)  ?TROPONIN I (HIGH SENSITIVITY)  ? ? ?EKG ?None ? ?Radiology ?CT Angio Chest PE W and/or Wo Contrast ? ?Result Date: 12/29/2021 ?CLINICAL DATA:  High probability for pulmonary embolism. Chest pain and shortness of breath. EXAM: CT ANGIOGRAPHY CHEST WITH CONTRAST TECHNIQUE: Multidetector CT imaging of the chest was performed using the standard protocol during bolus  administration of intravenous contrast. Multiplanar CT image reconstructions and MIPs were obtained to evaluate the vascular anatomy. RADIATION DOSE REDUCTION: This exam was performed according to the department

## 2021-12-29 NOTE — Plan of Care (Signed)
[facility] will assume care on arrival to accepting facility. Until arrival, care as per EDP. However, [facility] available 24/7 for questions and assistance.  Nursing staff, please page [facility] Admits and Consults ([phone]) as soon as the patient arrives to the hospital.   

## 2021-12-30 ENCOUNTER — Observation Stay (HOSPITAL_COMMUNITY): Payer: Medicare Other

## 2021-12-30 ENCOUNTER — Encounter (HOSPITAL_COMMUNITY): Payer: Self-pay | Admitting: Internal Medicine

## 2021-12-30 DIAGNOSIS — M545 Low back pain, unspecified: Secondary | ICD-10-CM | POA: Diagnosis present

## 2021-12-30 DIAGNOSIS — Z9013 Acquired absence of bilateral breasts and nipples: Secondary | ICD-10-CM | POA: Diagnosis not present

## 2021-12-30 DIAGNOSIS — Z96653 Presence of artificial knee joint, bilateral: Secondary | ICD-10-CM | POA: Diagnosis present

## 2021-12-30 DIAGNOSIS — Z803 Family history of malignant neoplasm of breast: Secondary | ICD-10-CM | POA: Diagnosis not present

## 2021-12-30 DIAGNOSIS — G4733 Obstructive sleep apnea (adult) (pediatric): Secondary | ICD-10-CM | POA: Diagnosis present

## 2021-12-30 DIAGNOSIS — Z853 Personal history of malignant neoplasm of breast: Secondary | ICD-10-CM | POA: Diagnosis not present

## 2021-12-30 DIAGNOSIS — K6389 Other specified diseases of intestine: Secondary | ICD-10-CM | POA: Diagnosis not present

## 2021-12-30 DIAGNOSIS — R079 Chest pain, unspecified: Secondary | ICD-10-CM | POA: Diagnosis not present

## 2021-12-30 DIAGNOSIS — I129 Hypertensive chronic kidney disease with stage 1 through stage 4 chronic kidney disease, or unspecified chronic kidney disease: Secondary | ICD-10-CM | POA: Diagnosis present

## 2021-12-30 DIAGNOSIS — E860 Dehydration: Secondary | ICD-10-CM | POA: Diagnosis present

## 2021-12-30 DIAGNOSIS — R1013 Epigastric pain: Secondary | ICD-10-CM | POA: Diagnosis not present

## 2021-12-30 DIAGNOSIS — Z87891 Personal history of nicotine dependence: Secondary | ICD-10-CM | POA: Diagnosis not present

## 2021-12-30 DIAGNOSIS — K449 Diaphragmatic hernia without obstruction or gangrene: Secondary | ICD-10-CM | POA: Diagnosis not present

## 2021-12-30 DIAGNOSIS — K5730 Diverticulosis of large intestine without perforation or abscess without bleeding: Secondary | ICD-10-CM | POA: Diagnosis not present

## 2021-12-30 DIAGNOSIS — Z955 Presence of coronary angioplasty implant and graft: Secondary | ICD-10-CM | POA: Diagnosis not present

## 2021-12-30 DIAGNOSIS — R0602 Shortness of breath: Secondary | ICD-10-CM | POA: Diagnosis present

## 2021-12-30 DIAGNOSIS — I700 Atherosclerosis of aorta: Secondary | ICD-10-CM | POA: Diagnosis present

## 2021-12-30 DIAGNOSIS — K224 Dyskinesia of esophagus: Secondary | ICD-10-CM | POA: Diagnosis not present

## 2021-12-30 DIAGNOSIS — D649 Anemia, unspecified: Secondary | ICD-10-CM | POA: Diagnosis present

## 2021-12-30 DIAGNOSIS — Z9049 Acquired absence of other specified parts of digestive tract: Secondary | ICD-10-CM | POA: Diagnosis not present

## 2021-12-30 DIAGNOSIS — Z8249 Family history of ischemic heart disease and other diseases of the circulatory system: Secondary | ICD-10-CM | POA: Diagnosis not present

## 2021-12-30 DIAGNOSIS — R1310 Dysphagia, unspecified: Secondary | ICD-10-CM | POA: Diagnosis not present

## 2021-12-30 DIAGNOSIS — E785 Hyperlipidemia, unspecified: Secondary | ICD-10-CM | POA: Diagnosis present

## 2021-12-30 DIAGNOSIS — I2510 Atherosclerotic heart disease of native coronary artery without angina pectoris: Secondary | ICD-10-CM | POA: Diagnosis present

## 2021-12-30 DIAGNOSIS — Z90710 Acquired absence of both cervix and uterus: Secondary | ICD-10-CM | POA: Diagnosis not present

## 2021-12-30 DIAGNOSIS — R7303 Prediabetes: Secondary | ICD-10-CM | POA: Diagnosis present

## 2021-12-30 DIAGNOSIS — K219 Gastro-esophageal reflux disease without esophagitis: Secondary | ICD-10-CM | POA: Diagnosis present

## 2021-12-30 DIAGNOSIS — N183 Chronic kidney disease, stage 3 unspecified: Secondary | ICD-10-CM | POA: Diagnosis present

## 2021-12-30 DIAGNOSIS — R1314 Dysphagia, pharyngoesophageal phase: Secondary | ICD-10-CM | POA: Diagnosis present

## 2021-12-30 DIAGNOSIS — I252 Old myocardial infarction: Secondary | ICD-10-CM | POA: Diagnosis not present

## 2021-12-30 DIAGNOSIS — F32A Depression, unspecified: Secondary | ICD-10-CM | POA: Diagnosis present

## 2021-12-30 DIAGNOSIS — N179 Acute kidney failure, unspecified: Secondary | ICD-10-CM | POA: Diagnosis present

## 2021-12-30 DIAGNOSIS — R112 Nausea with vomiting, unspecified: Secondary | ICD-10-CM | POA: Diagnosis not present

## 2021-12-30 LAB — VITAMIN B12: Vitamin B-12: 895 pg/mL (ref 180–914)

## 2021-12-30 LAB — COMPREHENSIVE METABOLIC PANEL
ALT: 14 U/L (ref 0–44)
AST: 14 U/L — ABNORMAL LOW (ref 15–41)
Albumin: 2.7 g/dL — ABNORMAL LOW (ref 3.5–5.0)
Alkaline Phosphatase: 52 U/L (ref 38–126)
Anion gap: 11 (ref 5–15)
BUN: 30 mg/dL — ABNORMAL HIGH (ref 8–23)
CO2: 17 mmol/L — ABNORMAL LOW (ref 22–32)
Calcium: 8.4 mg/dL — ABNORMAL LOW (ref 8.9–10.3)
Chloride: 109 mmol/L (ref 98–111)
Creatinine, Ser: 1.07 mg/dL — ABNORMAL HIGH (ref 0.44–1.00)
GFR, Estimated: 53 mL/min — ABNORMAL LOW (ref >60)
Glucose, Bld: 136 mg/dL — ABNORMAL HIGH (ref 70–99)
Potassium: 4.3 mmol/L (ref 3.5–5.1)
Sodium: 137 mmol/L (ref 135–145)
Total Bilirubin: 0.4 mg/dL (ref 0.3–1.2)
Total Protein: 4.7 g/dL — ABNORMAL LOW (ref 6.5–8.1)

## 2021-12-30 LAB — CBC WITH DIFFERENTIAL/PLATELET
Abs Immature Granulocytes: 0.01 10*3/uL (ref 0.00–0.07)
Basophils Absolute: 0 10*3/uL (ref 0.0–0.1)
Basophils Relative: 0 %
Eosinophils Absolute: 0 10*3/uL (ref 0.0–0.5)
Eosinophils Relative: 0 %
HCT: 29.4 % — ABNORMAL LOW (ref 36.0–46.0)
Hemoglobin: 9.5 g/dL — ABNORMAL LOW (ref 12.0–15.0)
Immature Granulocytes: 0 %
Lymphocytes Relative: 16 %
Lymphs Abs: 0.7 10*3/uL (ref 0.7–4.0)
MCH: 29.9 pg (ref 26.0–34.0)
MCHC: 32.3 g/dL (ref 30.0–36.0)
MCV: 92.5 fL (ref 80.0–100.0)
Monocytes Absolute: 0.1 10*3/uL (ref 0.1–1.0)
Monocytes Relative: 3 %
Neutro Abs: 3.4 10*3/uL (ref 1.7–7.7)
Neutrophils Relative %: 81 %
Platelets: 209 10*3/uL (ref 150–400)
RBC: 3.18 MIL/uL — ABNORMAL LOW (ref 3.87–5.11)
RDW: 13.2 % (ref 11.5–15.5)
WBC: 4.2 10*3/uL (ref 4.0–10.5)
nRBC: 0 % (ref 0.0–0.2)

## 2021-12-30 LAB — FOLATE: Folate: 19.4 ng/mL (ref >5.9)

## 2021-12-30 LAB — RETICULOCYTES
Immature Retic Fract: 11.7 % (ref 2.3–15.9)
RBC.: 3.07 MIL/uL — ABNORMAL LOW (ref 3.87–5.11)
Retic Count, Absolute: 42.1 10*3/uL (ref 19.0–186.0)
Retic Ct Pct: 1.4 % (ref 0.4–3.1)

## 2021-12-30 LAB — IRON AND TIBC
Iron: 36 ug/dL (ref 28–170)
Saturation Ratios: 15 % (ref 10.4–31.8)
TIBC: 238 ug/dL — ABNORMAL LOW (ref 250–450)
UIBC: 202 ug/dL

## 2021-12-30 LAB — FERRITIN: Ferritin: 184 ng/mL (ref 11–307)

## 2021-12-30 LAB — TROPONIN I (HIGH SENSITIVITY): Troponin I (High Sensitivity): 2 ng/L (ref <18)

## 2021-12-30 IMAGING — RF DG ESOPHAGUS
9 of 10 series · 14 of 23 positions shown · non-contrast
Comparison: CT [DATE]

CLINICAL DATA: Difficulty swallowing. Sensation of food sticking in
esophagus. Regurgitation

EXAM:
ESOPHOGRAM / BARIUM SWALLOW / BARIUM TABLET STUDY
TECHNIQUE: Single contrast examination performed using thin barium liquid in
water. The patient was observed with fluoroscopy swallowing a 13 mm
barium sulphate tablet.
FLUOROSCOPY:
Radiation Exposure Index (as provided by the fluoroscopic device):
18.6 mGy Kerma

[Series 1: cp_standard · 2 of 235 frames shown (1 of 2)]
[frame 36/235]
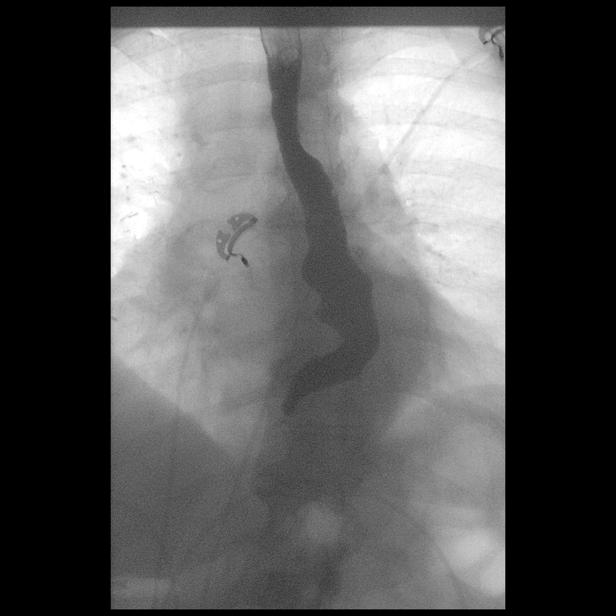
[frame 121/235]
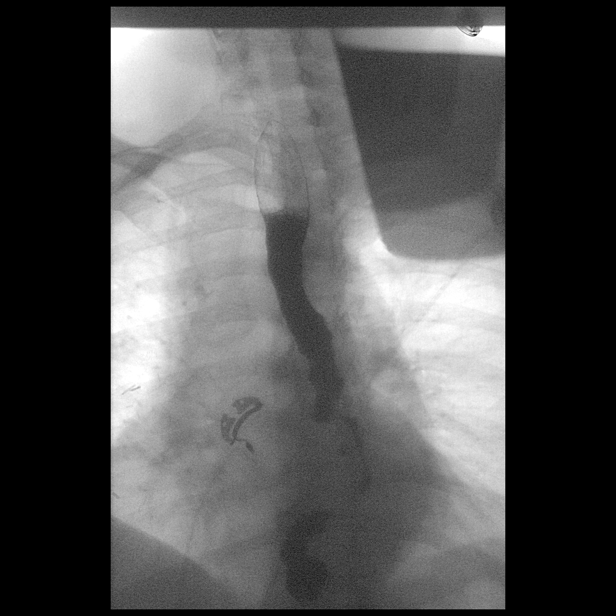

[Series 2: fluoro_barium 2fps_bw · 2 of 2 frames shown (1 of 7)]
[frame 1/2]
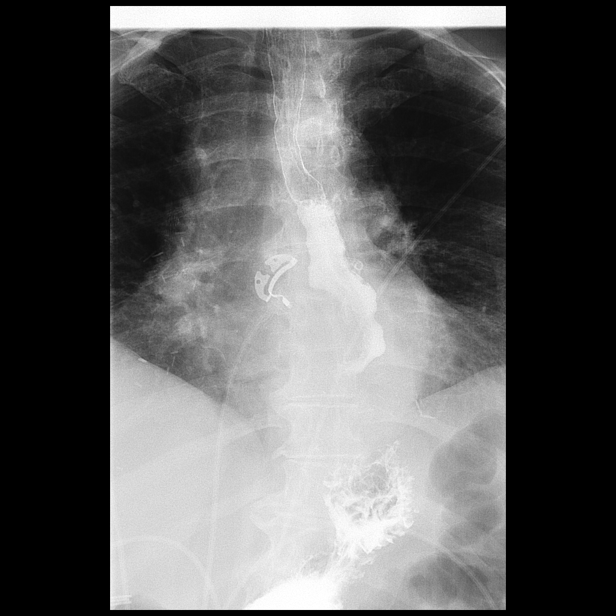
[frame 2/2]
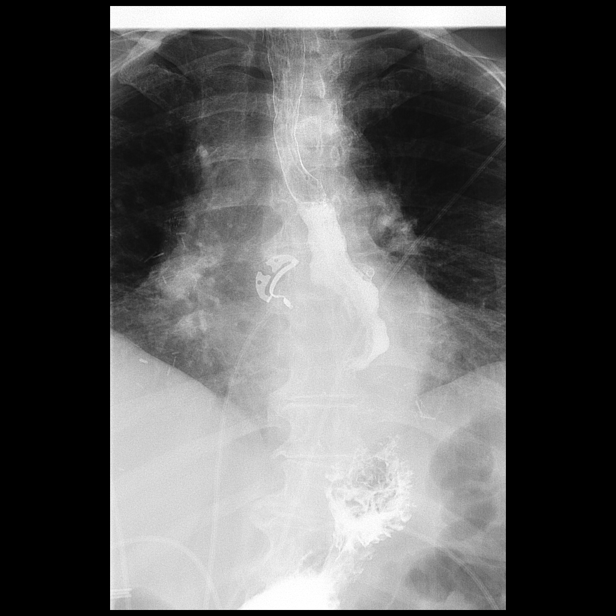

[Series 3: fluoro_barium 2fps_bw · 1 of 2 frames shown (2 of 7)]
[frame 2/2]
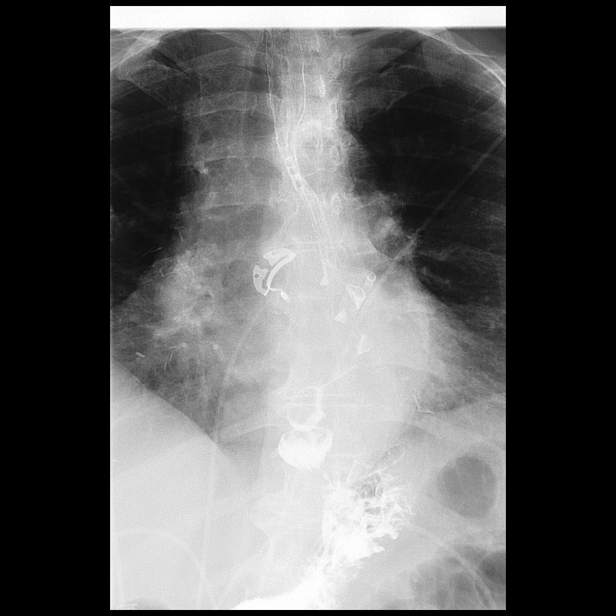

[Series 4: fluoro_barium 2fps_bw · 1 of 2 frames shown (3 of 7)]
[frame 2/2]
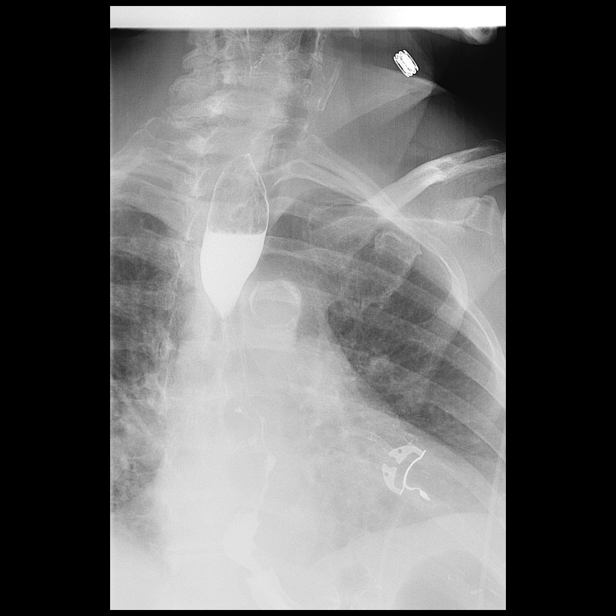

[Series 5: cp_standard · 3 of 96 frames shown (2 of 2)]
[frame 15/96]
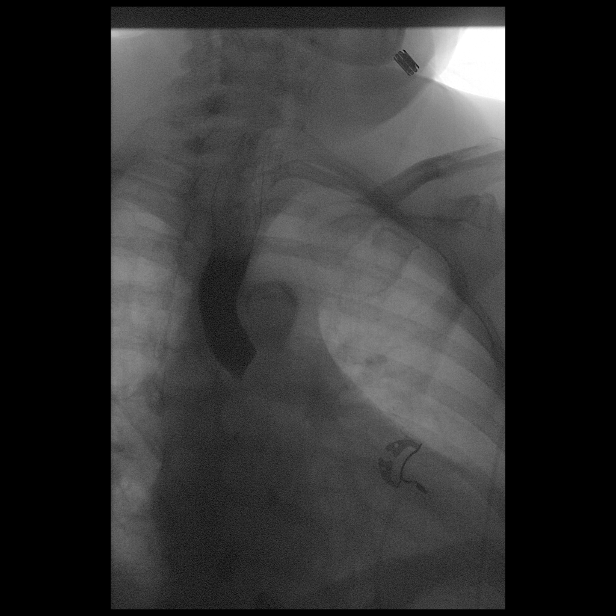
[frame 49/96]
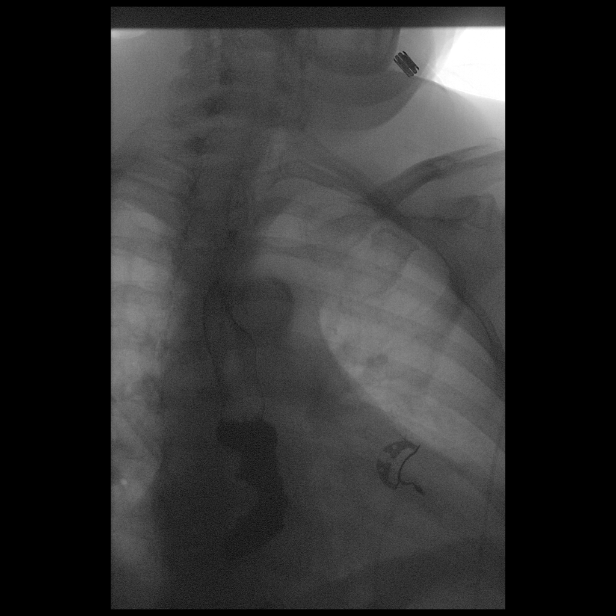
[frame 82/96]
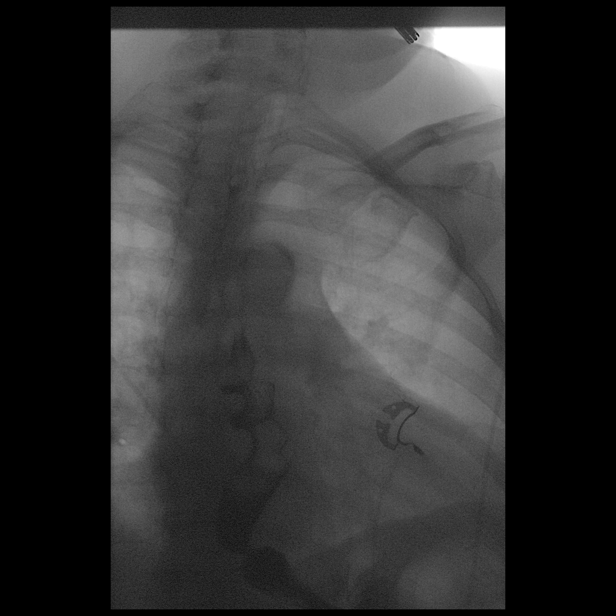

[Series 7: fluoro_barium 2fps_bw · 1 of 2 frames shown (4 of 7)]
[frame 1/2]
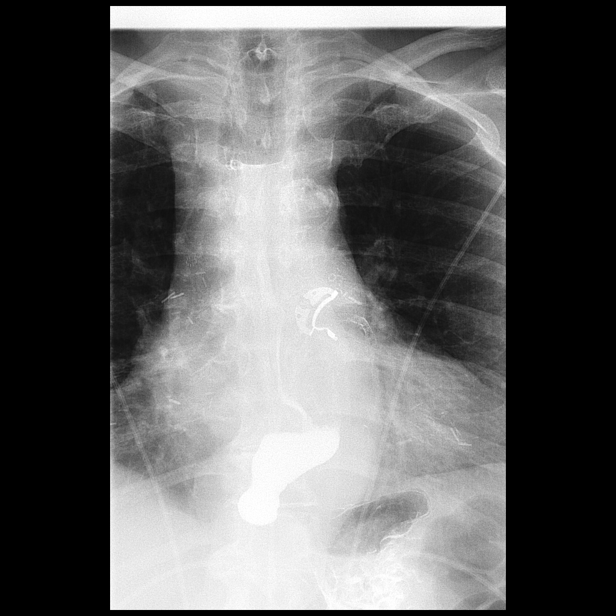

[Series 8: fluoro_barium 2fps_bw · 2 of 2 frames shown (5 of 7)]
[frame 1/2]
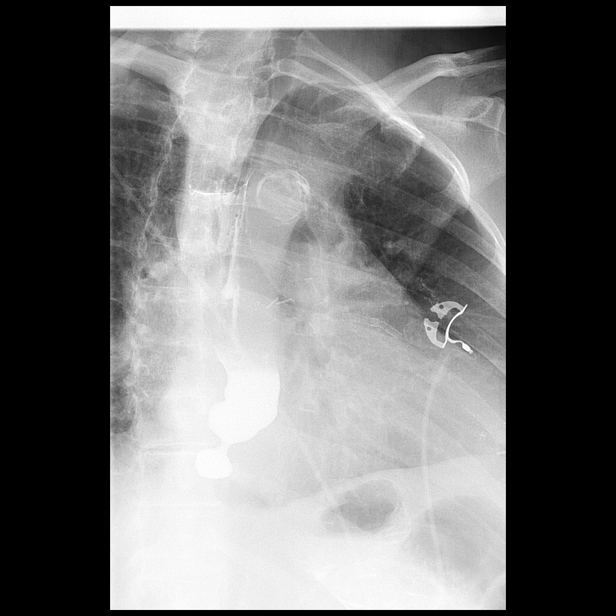
[frame 2/2]
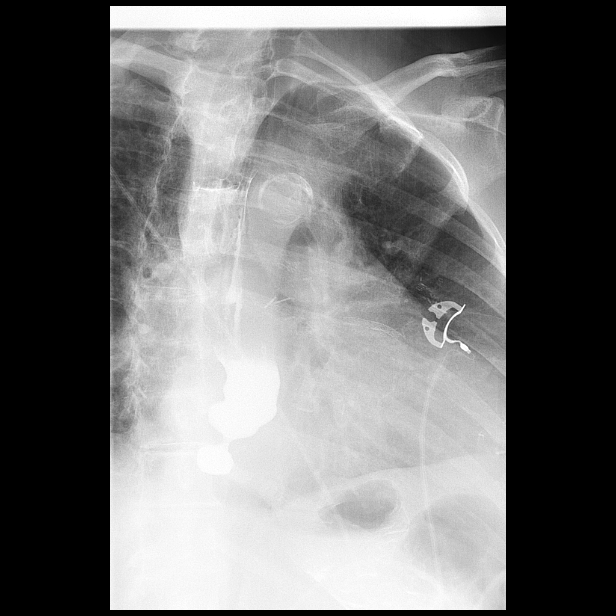

[Series 9: fluoro_barium 2fps_bw · 1 of 2 frames shown (6 of 7)]
[frame 2/2]
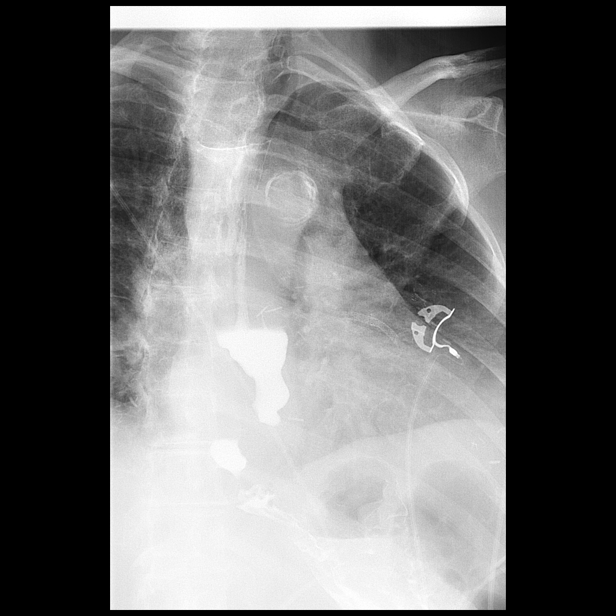

[Series 10: fluoro_barium 2fps_bw · 1 of 2 frames shown (7 of 7)]
[frame 2/2]
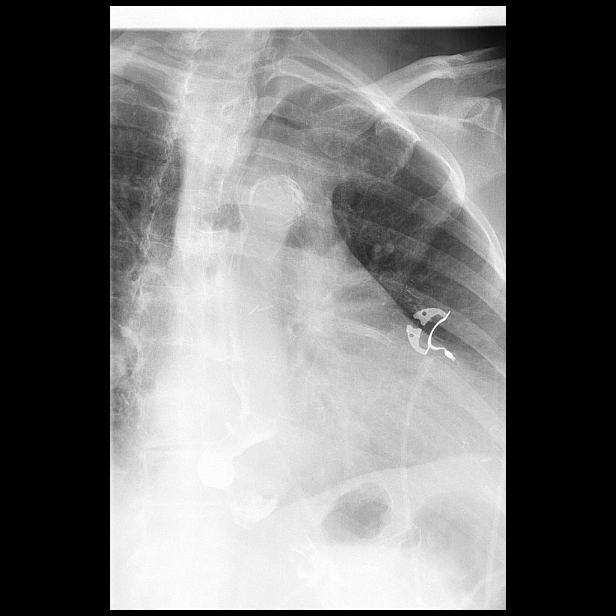

[14 of 23 positions shown; findings below may reference images not displayed]

FINDINGS: Thin barium oral contrast was administered. There is tortuosity of
the mid to distal esophagus. There is to and fro motion of the
barium bolus. No mucosal irregularity evident. No frank esophageal
spasm.

There is stricturing of the distal esophagus just above the GE
junction. 13 mm barium tablet was administered. Barium tablet failed
to pass the GE junction.

Patient vomited once following barium tablet maneuver.
IMPRESSION: 1. Tortuous esophagus with moderate dysmotility. No mucosal
irregularity.
2. A 13 mm barium tab failed to pass the GE junction consistent with
significant stricturing of the distal esophagus just above the GE
junction. Consider endoscopy for further evaluation

## 2021-12-30 MED ORDER — PANTOPRAZOLE SODIUM 40 MG PO TBEC
40.0000 mg | DELAYED_RELEASE_TABLET | Freq: Every day | ORAL | Status: DC
Start: 2021-12-30 — End: 2021-12-31
  Administered 2021-12-30 – 2021-12-31 (×2): 40 mg via ORAL
  Filled 2021-12-30 (×2): qty 1

## 2021-12-30 MED ORDER — METOPROLOL SUCCINATE ER 25 MG PO TB24
25.0000 mg | ORAL_TABLET | Freq: Every day | ORAL | Status: DC
  Administered 2021-12-30 – 2021-12-31 (×2): 25 mg via ORAL
  Filled 2021-12-30 (×2): qty 1

## 2021-12-30 MED ORDER — ALLOPURINOL 100 MG PO TABS
100.0000 mg | ORAL_TABLET | Freq: Every day | ORAL | Status: DC
Start: 2021-12-30 — End: 2021-12-31
  Administered 2021-12-30 – 2021-12-31 (×2): 100 mg via ORAL
  Filled 2021-12-30 (×2): qty 1

## 2021-12-30 MED ORDER — SERTRALINE HCL 100 MG PO TABS
100.0000 mg | ORAL_TABLET | Freq: Every day | ORAL | Status: DC
Start: 2021-12-30 — End: 2021-12-31
  Administered 2021-12-30 – 2021-12-31 (×2): 100 mg via ORAL
  Filled 2021-12-30 (×2): qty 1

## 2021-12-30 MED ORDER — METHOCARBAMOL 1000 MG/10ML IJ SOLN: 500.0000 mg | Freq: Three times a day (TID) | INTRAVENOUS | Status: DC | PRN

## 2021-12-30 MED ORDER — ASPIRIN 81 MG PO CHEW
81.0000 mg | CHEWABLE_TABLET | Freq: Every day | ORAL | Status: DC
  Administered 2021-12-30: 81 mg via ORAL
  Filled 2021-12-30: qty 1

## 2021-12-30 MED ORDER — LACTATED RINGERS IV SOLN: INTRAVENOUS | Status: AC

## 2021-12-30 MED ORDER — ENOXAPARIN SODIUM 40 MG/0.4ML IJ SOSY: 40.0000 mg | PREFILLED_SYRINGE | INTRAMUSCULAR | Status: DC

## 2021-12-30 MED ORDER — HYDRALAZINE HCL 20 MG/ML IJ SOLN: 10.0000 mg | INTRAMUSCULAR | Status: DC | PRN

## 2021-12-30 MED ORDER — CLOPIDOGREL BISULFATE 75 MG PO TABS
75.0000 mg | ORAL_TABLET | Freq: Every day | ORAL | Status: DC
  Administered 2021-12-30: 75 mg via ORAL
  Filled 2021-12-30: qty 1

## 2021-12-30 MED ORDER — EZETIMIBE 10 MG PO TABS
10.0000 mg | ORAL_TABLET | Freq: Every day | ORAL | Status: DC
  Administered 2021-12-30 – 2021-12-31 (×2): 10 mg via ORAL
  Filled 2021-12-30 (×2): qty 1

## 2021-12-30 MED ORDER — ROSUVASTATIN CALCIUM 20 MG PO TABS
20.0000 mg | ORAL_TABLET | Freq: Every day | ORAL | Status: DC
  Administered 2021-12-30 – 2021-12-31 (×2): 20 mg via ORAL
  Filled 2021-12-30 (×2): qty 1

## 2021-12-30 MED ORDER — ENOXAPARIN SODIUM 30 MG/0.3ML IJ SOSY
30.0000 mg | PREFILLED_SYRINGE | INTRAMUSCULAR | Status: DC
  Administered 2021-12-30: 30 mg via SUBCUTANEOUS
  Filled 2021-12-30: qty 0.3

## 2021-12-30 NOTE — H&P (Addendum)
History and Physical    Tracey [name] NFA:[id number] DOB: 25-Nov-1945 DOA: 12/29/2021  PCP: Alysia Penna, MD  Patient coming from: Home.  Chief Complaint: Mid back pain and shortness of breath.  HPI: Tracey [name] is a 76 y.o. female with history of CAD status post stenting, hypertension, hyperlipidemia, history of breast cancer presents to the ER with complaints of having mid back pain and shortness of breath ongoing for last 3 days.  About 4 days ago patient also had an episode of diarrhea.  Patient is feeling fatigued and weak.  Patient had left knee arthroplasty about a month ago.  ED Course: In the ER patient had a CT angiogram of the chest which was negative for pulm embolism did show some rib fractures which are subacute.  Patient's labs show acute renal failure with creatinine worsening from 1.4-1.8.  Patient was started on fluids admitted for further observation.  Review of Systems: As per HPI, rest all negative.   Past Medical History:  Diagnosis Date   Allergy    Anxiety    takes Ativan daily prn   Arthritis    Breast cancer (HCC) 2014   ER+/PR+/HEr2-,    Bruises easily    Colon polyps    2 polyps by report   Coronary artery disease involving native coronary artery of native heart without angina pectoris 01/01/2014   NSTEMI in January 2019 in [place]>> s/p DES x2 to the LAD (procedure complicated by stent thrombosis) Myoview 05/2020: EF 72, no infarct or ischemia; low risk Echocardiogram 05/2019: EF 60-65, GLS -21.5 Cardiac catheterization at [facility] in 08/2017: Normal left main, normal LCx, normal RCA; LAD/diagonal 95%>> PCI    Diverticulosis    GERD (gastroesophageal reflux disease)    occasionally takes Nexium    Gout    takes Allopurinol daily and Colchicine daily prn;last attack 108yrs ago   Gout    History of bladder infections    many yrs ago   History of bronchitis    last time at least 3yrs ago   History of stress incontinence     Hyperlipidemia    takes Pravastatin daily   Hypertension    Insomnia    takes Ambien nightly prn   NSTEMI (non-ST elevated myocardial infarction) (HCC) 08/2017   OSA (obstructive sleep apnea)    on CPAP   Osteoarthritis    Peripheral edema    takes Furosemide daily prn   PONV (postoperative nausea and vomiting)    Postmenopausal hormone therapy    Radiation 07/27/13-09/07/13   Right Breast x 31 treatments   Rhinitis    uses Flonase prn   Sinus congestion    Status post breast reconstruction 10/15/2014   Bilateral implant removal and DIEP performed in [place], [place]   Tachycardia    takes Metoprolol daily   Ventricular tachycardia, non-sustained (HCC)    during sleep study 2009 with normal cardiac workup    Past Surgical History:  Procedure Laterality Date   ABDOMINAL HYSTERECTOMY     with BSO   APPENDECTOMY     AXILLARY SENTINEL NODE BIOPSY Right 05/23/2013   Procedure: AXILLARY SENTINEL NODE BIOPSY;  Surgeon: Adolph Pollack, MD;  [facility];  Service: General;  Laterality: Right;  nuc med injection 7:00   BREAST RECONSTRUCTION WITH PLACEMENT OF TISSUE EXPANDER AND FLEX HD (ACELLULAR HYDRATED DERMIS) Bilateral 05/23/2013   Procedure: BILATERAL BREAST RECONSTRUCTION WITH PLACEMENT OF TISSUE EXPANDER AND FLEX HD;  Surgeon: Etter Sjogren, MD;  [facility];  Service: Plastics;  Laterality: Bilateral;   CARDIAC CATHETERIZATION  1999   CATARACT EXTRACTION, BILATERAL Bilateral    CHOLECYSTECTOMY     COSMETIC SURGERY     OTHER SURGICAL HISTORY  09/2017   had cyst removal from spine    PERCUTANEOUS CORONARY STENT INTERVENTION (PCI-S)  08/2017   done in [place]     REMOVAL OF BILATERAL TISSUE EXPANDERS WITH PLACEMENT OF BILATERAL BREAST IMPLANTS Bilateral 05/17/2014   Procedure: REMOVAL OF BILATERAL TISSUE EXPANDERS WITH PLACEMENT OF BILATERAL BREAST IMPLANTS FOR RECONSTRUCTION;  Surgeon: Etter Sjogren, MD;  [facility];  Service: Plastics;  Laterality: Bilateral;    TONSILLECTOMY     TOTAL KNEE ARTHROPLASTY Right 07/26/2018   Procedure: TOTAL KNEE ARTHROPLASTY;  Surgeon: Durene Romans, MD;  [facility];  Service: Orthopedics;  Laterality: Right;  70 mins   TOTAL KNEE ARTHROPLASTY Left 12/02/2021   Procedure: TOTAL KNEE ARTHROPLASTY;  Surgeon: Durene Romans, MD;  [facility];  Service: Orthopedics;  Laterality: Left;   TOTAL MASTECTOMY Bilateral 05/23/2013   Procedure: TOTAL MASTECTOMY;  Surgeon: Adolph Pollack, MD;  Location: [facility] OR;  Service: General;  Laterality: Bilateral;     reports that she has quit smoking. Her smoking use included cigarettes. She started smoking about 29 years ago. She has a 20.00 pack-year smoking history. She has never used smokeless tobacco. She reports current alcohol use of about 7.0 - 14.0 standard drinks per week. She reports current drug use. Drug: Other-see comments.  Allergies  Allergen Reactions   Pseudoeph-Hydrocodone-Gg Other (See Comments)   Ivp Dye [Iodinated Contrast Media] Hives   Sulfa Antibiotics Swelling   Tape Other (See Comments)    Unknown; has paper thin skin ; ok to use paper tape     Family History  Problem Relation Age of Onset   Breast cancer Mother        possible inflammatory breast cancer   Heart attack Father    Heart disease Father    ALS Sister    Breast cancer Maternal Grandmother 10   Heart attack Maternal Grandfather    Heart attack Paternal Grandfather    Breast cancer Maternal Aunt        dx in her 81s   Breast cancer Other        maternal great grandmother; dx in her 54s    Prior to Admission medications   Medication Sig Start Date End Date Taking? Authorizing Provider  allopurinol (ZYLOPRIM) 100 MG tablet TAKE 1 TABLET BY MOUTH ONCE DAILY Patient taking differently: Take 100 mg by mouth daily. 06/25/16   Magrinat, Valentino Hue, MD  amoxicillin (AMOXIL) 500 MG tablet Take 2,000 mg by mouth as directed. Take 1 hr before dental appt    [provider]   apraclonidine (IOPIDINE) 0.5 % ophthalmic solution Place 1 drop into the right eye daily as needed (for droopy eye).  07/13/18   [provider]  ASPIRIN LOW DOSE 81 MG chewable tablet Chew 81 mg by mouth daily.  09/19/17   [provider]  Cholecalciferol (VITAMIN D3) 50 MCG (2000 UT) TABS Take 2,000 Units by mouth daily.    [provider]  clopidogrel (PLAVIX) 75 MG tablet Take 1 tablet (75 mg total) by mouth daily. 06/02/21   Alver Sorrow, NP  colchicine 0.6 MG tablet TAKE 1 TABLET BY MOUTH EVERY DAY OR AS NEEDED Patient taking differently: Take 0.6 mg by mouth daily as needed (gout). 05/27/17   Magrinat,  Valentino Hue, MD  cyanocobalamin (,VITAMIN B-12,) 1000 MCG/ML injection Inject 1,000 mcg into the muscle every 30 (thirty) days.    [provider]  cyclobenzaprine (FLEXERIL) 10 MG tablet Take 1 tablet (10 mg total) by mouth 3 (three) times daily as needed for muscle spasms. 12/09/20   Dohmeier, Porfirio Mylar, MD  docusate sodium (COLACE) 100 MG capsule Take 1 capsule (100 mg total) by mouth 2 (two) times daily. Patient taking differently: Take 100 mg by mouth 2 (two) times daily as needed for mild constipation. 07/26/18   Lanney Gins, PA-C  Doxylamine-DM & DM (VICKS DAYQUIL/NYQUIL COUGH) 6.25-15 & 15 MG/15ML LQPK Take 15 mLs by mouth every 6 (six) hours as needed (allergy symptoms).    [provider]  ezetimibe (ZETIA) 10 MG tablet TAKE 1 TABLET(10 MG) BY MOUTH DAILY Patient taking differently: Take 10 mg by mouth daily. 09/05/21   Tonny Bollman, MD  fluticasone [facility]) 50 MCG/ACT nasal spray Place 1 spray into both nostrils daily. 01/18/13   [provider]  furosemide (LASIX) 40 MG tablet Take 40 mg by mouth daily as needed for fluid.  04/01/13   [provider]  lisinopril (ZESTRIL) 10 MG tablet TAKE 1 TABLET(10 MG) BY MOUTH DAILY Patient taking differently: Take 10 mg by mouth daily. 09/12/21   Tonny Bollman, MD  loratadine  (CLARITIN) 10 MG tablet Take 10 mg by mouth daily.    [provider]  metoprolol succinate (TOPROL-XL) 25 MG 24 hr tablet TAKE 1 TABLET(25 MG) BY MOUTH DAILY Patient taking differently: Take 25 mg by mouth daily. 06/02/21   Tonny Bollman, MD  Multiple Vitamin (MULTI-VITAMIN) tablet Take 1 tablet by mouth daily.    [provider]  nitroGLYCERIN (NITROSTAT) 0.4 MG SL tablet Place 1 tablet (0.4 mg total) under the tongue every 5 (five) minutes as needed for chest pain. 06/02/21   Alver Sorrow, NP  NONFORMULARY OR COMPOUNDED ITEM Vitamin E vaginal cream 200u/ml.  One ml pv three times weekly. 09/11/19   Jerene Bears, MD  oxyCODONE (OXY IR/ROXICODONE) 5 MG immediate release tablet Take 1-2 tablets (5-10 mg total) by mouth every 4 (four) hours as needed for severe pain. 12/03/21   Cassandria Anger, PA-C  pantoprazole (PROTONIX) 40 MG tablet TAKE 1 TABLET(40 MG) BY MOUTH DAILY Patient taking differently: Take 40 mg by mouth daily. 08/20/21   Alver Sorrow, NP  polyethylene glycol (MIRALAX / GLYCOLAX) 17 g packet Take 17 g by mouth daily as needed for mild constipation. 12/03/21   Cassandria Anger, PA-C  rosuvastatin (CRESTOR) 20 MG tablet Take 1 tablet (20 mg total) by mouth daily. 06/02/21   Alver Sorrow, NP  sertraline (ZOLOFT) 100 MG tablet Take 100 mg by mouth daily. 12/06/20   [provider]  testosterone cypionate (DEPOTESTOSTERONE CYPIONATE) 200 MG/ML injection Inject 200 mg into the muscle every 28 (twenty-eight) days. 01/17/19   [provider]    Physical Exam: Constitutional: Moderately built and nourished. Vitals:   12/29/21 2030 12/29/21 2141 12/29/21 2300 12/29/21 2305  BP: 129/80  (!) 127/57   Pulse: 82 82 79   Resp: 13 20 15    Temp:    98 F (36.7 C)  TempSrc:    Oral  SpO2: 96% 99% 100%   Weight:      Height:       Eyes: Anicteric no pallor. ENMT: No discharge from the ears eyes nose and mouth. Neck: No mass felt.  No neck  rigidity. Respiratory: No rhonchi or crepitations. Cardiovascular: S1-S2 heard. Abdomen: Soft nontender bowel sound present. Musculoskeletal: No discharge from the left knee arthroplasty site. Skin: There is a small eschar on the medial aspect of the left knee.  Which patient states developed after the surgery.  It is getting better as per the patient.  No rash on the back of the chest.  Has some radiation related rash on anterior chest. Neurologic: Alert awake oriented to time place and person.  Moves all extremities. Psychiatric: Appears normal.  Normal affect.   Labs on Admission: I have personally reviewed following labs and imaging studies  CBC: Recent Labs  Lab 12/29/21 1736  WBC 6.2  HGB 11.7*  HCT 35.8*  MCV 91.6  PLT 252   Basic Metabolic Panel: Recent Labs  Lab 12/29/21 1736  NA 137  K 4.6  CL 103  CO2 23  GLUCOSE 75  BUN 41*  CREATININE 1.83*  CALCIUM 9.6   GFR: Estimated Creatinine Clearance: 22.3 mL/min (A) (by C-G formula based on SCr of 1.83 mg/dL (H)). Liver Function Tests: Recent Labs  Lab 12/29/21 1736  AST 20  ALT 17  ALKPHOS 70  BILITOT 0.5  PROT 7.0  ALBUMIN 4.4   No results for input(s): LIPASE, AMYLASE in the last 168 hours. No results for input(s): AMMONIA in the last 168 hours. Coagulation Profile: Recent Labs  Lab 12/29/21 1736  INR 1.0   Cardiac Enzymes: No results for input(s): CKTOTAL, CKMB, CKMBINDEX, TROPONINI in the last 168 hours. BNP (last 3 results) No results for input(s): PROBNP in the last 8760 hours. HbA1C: No results for input(s): HGBA1C in the last 72 hours. CBG: No results for input(s): GLUCAP in the last 168 hours. Lipid Profile: No results for input(s): CHOL, HDL, LDLCALC, TRIG, CHOLHDL, LDLDIRECT in the last 72 hours. Thyroid Function Tests: No results for input(s): TSH, T4TOTAL, FREET4, T3FREE, THYROIDAB in the last 72 hours. Anemia Panel: No results for input(s): VITAMINB12, FOLATE, FERRITIN, TIBC,  IRON, RETICCTPCT in the last 72 hours. Urine analysis:    Component Value Date/Time   COLORURINE YELLOW 08/16/2018 1233   APPEARANCEUR CLEAR 08/16/2018 1233   LABSPEC 1.023 08/16/2018 1233   PHURINE 5.0 08/16/2018 1233   GLUCOSEU NEGATIVE 08/16/2018 1233   HGBUR NEGATIVE 08/16/2018 1233   BILIRUBINUR NEGATIVE 08/16/2018 1233   KETONESUR NEGATIVE 08/16/2018 1233   PROTEINUR NEGATIVE 08/16/2018 1233   NITRITE NEGATIVE 08/16/2018 1233   LEUKOCYTESUR SMALL (A) 08/16/2018 1233   Sepsis Labs: @LABRCNTIP (procalcitonin:4,lacticidven:4) )No results found for this or any previous visit (from the past 240 hour(s)).   Radiological Exams on Admission: CT Angio Chest PE W and/or Wo Contrast  Result Date: 12/29/2021 CLINICAL DATA:  High probability for pulmonary embolism. Chest pain and shortness of breath. EXAM: CT ANGIOGRAPHY CHEST WITH CONTRAST TECHNIQUE: Multidetector CT imaging of the chest was performed using the standard protocol during bolus administration of intravenous contrast. Multiplanar CT image reconstructions and MIPs were obtained to evaluate the vascular anatomy. RADIATION DOSE REDUCTION: This exam was performed according to the departmental dose-optimization program which includes automated exposure control, adjustment of the mA and/or kV according to patient size and/or use of iterative reconstruction technique. CONTRAST:  60mL OMNIPAQUE IOHEXOL 350 MG/ML SOLN COMPARISON:  CT angiogram chest 08/17/2018. FINDINGS: Cardiovascular: Satisfactory opacification of the pulmonary arteries to the segmental level. No evidence of pulmonary embolism. Normal heart size. No pericardial effusion. There are atherosclerotic calcifications of the aorta. Mediastinum/Nodes: Small hiatal hernia is present. There is mild  diffuse esophageal wall thickening similar to the prior study. Lungs/Pleura: There is a stable 4 mm ground-glass nodule in the left upper lobe image 6/38. There is no focal lung infiltrate,  pleural effusion or pneumothorax. There is minimal pleural scarring in the anterior right upper lobe similar to the prior study. There are 2 adjacent nodules in the left lower lobe measuring up to 4 mm image 6/85 which are unchanged from prior study. Upper Abdomen: Cholecystectomy clips are present. Musculoskeletal: There are subacute appearing left sixth, seventh and eighth rib fractures. Multilevel degenerative changes affect the spine. Right axillary and chest wall surgical clips are present. Review of the MIP images confirms the above findings. IMPRESSION: 1. No evidence for pulmonary embolism. 2. Nodular densities measuring up to 4 mm are unchanged from 2020 favored as benign. 3. Subacute left sixth, seventh and eighth rib fractures. Correlate clinically. Aortic Atherosclerosis (ICD10-I70.0). Electronically Signed   By: Darliss Cheney M.D.   On: 12/29/2021 22:14    EKG: Independently reviewed.  Normal sinus rhythm.  Assessment/Plan Principal Problem:   AKI (acute kidney injury) (HCC) Active Problems:   Breast cancer of upper-outer quadrant of right female breast (HCC)   Essential hypertension   OSA on CPAP   Hyperlipidemia LDL goal <70   SOB (shortness of breath)   ARF (acute renal failure) (HCC)    Acute renal failure on chronic kidney disease stage III likely could be from recent poor oral intake episode of diarrhea with lisinopril.  We will hold lisinopril gently hydrate follow metabolic panel. Mid back pain with history of CAD troponins are negative.  Pain is mostly in the mid back.  Since patient is still concerned we will get a CT renal study to make sure there is no renal related pain or any intra-abdominal cause. Hypertension on beta-blockers.  Holding lisinopril due to renal failure.  As needed IV hydralazine has been added. CAD continue antiplatelet agents statins Zetia and beta-blockers. Sleep apnea on CPAP. History of breast cancer. Anemia appears to be new.  Follow CBC.  Check  anemia panel with next blood draw. History of gout on allopurinol. History of depression on Zoloft.   DVT prophylaxis: Lovenox. Code Status: Full code. Family Communication: Patient's daughter at the bedside. Disposition Plan: Home. Consults called: None. Admission status: Observation.   Eduard Clos MD [facility] Pager [phone].  If 7PM-7AM, please contact night-coverage [email or web address] Password [facility]  12/30/2021, 3:18 AM

## 2021-12-30 NOTE — Consult Note (Signed)
Reason for Consult:Dysphagia ?Referring Physician: [facility] ? ?Tracey [name] ?HPI: This is a 76 year old female with a PMH of GERD, NSTEMI in 2019, CAD, hyperlipidemia, breast cancer, and OSA admitted with complaints of SOB and mid back pain.  She also incidentally reported problems with dysphagia.  The patient reports a chronic history of dysphagia, and per the husband, this problem was present 40 years ago.  Lately the patient reports having solid food dysphagia that progressed to dysphagia with liquids.  She feels as if her esophagus is closing up or spasming in the epigastric region.  Recently she noticed that she needed to regurgitation her oral intake, but she denies any problems with hematemesis or weight loss.  Her last EGD in the office was in 2015 for complaints of GERD and it was normal.  She spends part of her time in [place] and an EGD with dilation was performed a few years ago.  In her opinion, the dilation did help.  Speech pathology evaluated her and she was felt to have a primary esophageal dysphagia. ? ?Past Medical History:  ?Diagnosis Date  ? Allergy   ? Anxiety   ? takes Ativan daily prn  ? Arthritis   ? Breast cancer ([place]) 2014  ? ER+/PR+/HEr2-,   ? Bruises easily   ? Colon polyps   ? 2 polyps by report  ? Coronary artery disease involving native coronary artery of native heart without angina pectoris 01/01/2014  ? NSTEMI in January 2019 in [place]>> s/p DES x2 to the LAD (procedure complicated by stent thrombosis) Myoview 05/2020: EF 72, no infarct or ischemia; low risk Echocardiogram 05/2019: EF 60-65, GLS -21.5 Cardiac [facility] at [facility] in 08/2017: Normal left main, normal LCx, normal RCA; LAD/diagonal 95%>> PCI   ? Diverticulosis   ? GERD (gastroesophageal reflux disease)   ? occasionally takes Nexium   ? Gout   ? takes Allopurinol daily and Colchicine daily prn;last attack 77yr ago  ? Gout   ? History of bladder infections   ? many yrs ago  ?  History of bronchitis   ? last time at least 837yrago  ? History of stress incontinence   ? Hyperlipidemia   ? takes Pravastatin daily  ? Hypertension   ? Insomnia   ? takes Ambien nightly prn  ? [place]  ? OSA (obstructive sleep apnea)   ? on CPAP  ? Osteoarthritis   ? Peripheral edema   ? takes Furosemide daily prn  ? PONV (postoperative nausea and vomiting)   ? Postmenopausal hormone therapy   ? Radiation 07/27/13-09/07/13  ? Right Breast x 31 treatments  ? Rhinitis   ? uses Flonase prn  ? Sinus congestion   ? Status post breast reconstruction 10/15/2014  ? [place]  ? Tachycardia   ? takes Metoprolol daily  ? Ventricular tachycardia, non-sustained (HCC)   ? during sleep study 2009 with normal cardiac workup  ? ? ?Past Surgical History:  ?Procedure Laterality Date  ? ABDOMINAL HYSTERECTOMY    ? with BSO  ? APPENDECTOMY    ? AXILLARY SENTINEL NODE BIOPSY Right 05/23/2013  ? Procedure: AXILLARY SENTINEL NODE BIOPSY;  Surgeon: ToOdis HollingsheadMD;  [facility];  Laterality: Right;  nuc med injection 7:00  ? BREAST RECONSTRUCTION WITH PLACEMENT OF TISSUE EXPANDER AND FLEX HD (ACELLULAR HYDRATED DERMIS) Bilateral 05/23/2013  ? Procedure:  BILATERAL BREAST RECONSTRUCTION WITH PLACEMENT OF TISSUE EXPANDER AND FLEX HD;  Surgeon: [name], MD;  Location: [place];  Service: Plastics;  Laterality: Bilateral;  ? CARDIAC CATHETERIZATION  1999  ? CATARACT EXTRACTION, BILATERAL Bilateral   ? CHOLECYSTECTOMY    ? COSMETIC SURGERY    ? OTHER SURGICAL HISTORY  09/2017  ? had cyst removal from spine   ? PERCUTANEOUS CORONARY STENT INTERVENTION (PCI-S)  08/2017  ? done in [place]    ? REMOVAL OF BILATERAL TISSUE EXPANDERS WITH PLACEMENT OF BILATERAL BREAST IMPLANTS Bilateral 05/17/2014  ? Procedure: REMOVAL OF BILATERAL TISSUE EXPANDERS WITH PLACEMENT OF BILATERAL BREAST IMPLANTS FOR RECONSTRUCTION;  Surgeon:  [name], MD;  Location: [place];  Service: Plastics;  Laterality: Bilateral;  ? TONSILLECTOMY    ? TOTAL KNEE ARTHROPLASTY Right 07/26/2018  ? Procedure: TOTAL KNEE ARTHROPLASTY;  Surgeon: Paralee Cancel, MD;  [facility];  Service: Orthopedics;  Laterality: Right;  70 mins  ? TOTAL KNEE ARTHROPLASTY Left 12/02/2021  ? Procedure: TOTAL KNEE ARTHROPLASTY;  Surgeon: Paralee Cancel, MD;  [facility];  Service: Orthopedics;  Laterality: Left;  ? TOTAL MASTECTOMY Bilateral 05/23/2013  ? Procedure: TOTAL MASTECTOMY;  Surgeon: [name], MD;  Location: [place];  Service: General;  Laterality: Bilateral;  ? ? ?Family History  ?Problem Relation Age of Onset  ? Breast cancer Mother   ?     possible inflammatory breast cancer  ? Heart attack Father   ? Heart disease Father   ? ALS Sister   ? Breast cancer Maternal Grandmother 17  ? Heart attack Maternal Grandfather   ? Heart attack Paternal Grandfather   ? Breast cancer Maternal Aunt   ?     dx in her 52s  ? Breast cancer Other   ?     maternal great grandmother; dx in her 37s  ? ? ?Social History:  reports that she has quit smoking. Her smoking use included cigarettes. She started smoking about 29 years ago. She has a 20.00 pack-year smoking history. She has never used smokeless tobacco. She reports current alcohol use of about 7.0 - 14.0 standard drinks per week. She reports current drug use. Drug: Other-see comments. ? ?Allergies:  ?Allergies  ?Allergen Reactions  ? Pseudoeph-Hydrocodone-Gg Other (See Comments)  ? Ivp Dye [Iodinated Contrast Media] Hives  ? Oxycodone Other (See Comments)  ?  hallucinations  ? Sulfa Antibiotics Swelling  ? Tape Other (See Comments)  ?  Unknown; tears skin ; ok to use paper tape   ? ? ?Medications: Scheduled: ? allopurinol  100 mg Oral Daily  ? aspirin  81 mg Oral Daily  ? clopidogrel  75 mg Oral Daily  ? enoxaparin (LOVENOX) injection  30 mg Subcutaneous Q24H  ? ezetimibe  10 mg Oral Daily  ? metoprolol succinate  25 mg Oral  Daily  ? pantoprazole  40 mg Oral Daily  ? rosuvastatin  20 mg Oral Daily  ? sertraline  100 mg Oral Daily  ? ?Continuous: ? lactated ringers 75 mL/hr at 12/30/21 1517  ? methocarbamol (ROBAXIN) IV    ? ? ?Results for orders placed or performed during the hospital encounter of 12/29/21 (from the past 24 hour(s))  ?Basic metabolic panel     Status: Abnormal  ? Collection Time: 12/29/21  5:36 PM  ?Result Value Ref Range  ? Sodium 137 135 - 145 mmol/L  ? Potassium 4.6 3.5 - 5.1 mmol/L  ? Chloride 103 98 - 111 mmol/L  ?  CO2 23 22 - 32 mmol/L  ? Glucose, Bld 75 70 - 99 mg/dL  ? BUN 41 (H) 8 - 23 mg/dL  ? Creatinine, Ser 1.83 (H) 0.44 - 1.00 mg/dL  ? Calcium 9.6 8.9 - 10.3 mg/dL  ? GFR, Estimated 28 (L) >60 mL/min  ? Anion gap 11 5 - 15  ?CBC     Status: Abnormal  ? Collection Time: 12/29/21  5:36 PM  ?Result Value Ref Range  ? WBC 6.2 4.0 - 10.5 K/uL  ? RBC 3.91 3.87 - 5.11 MIL/uL  ? Hemoglobin 11.7 (L) 12.0 - 15.0 g/dL  ? HCT 35.8 (L) 36.0 - 46.0 %  ? MCV 91.6 80.0 - 100.0 fL  ? MCH 29.9 26.0 - 34.0 pg  ? MCHC 32.7 30.0 - 36.0 g/dL  ? RDW 13.7 11.5 - 15.5 %  ? Platelets 252 150 - 400 K/uL  ? nRBC 0.0 0.0 - 0.2 %  ?Troponin I (High Sensitivity)     Status: None  ? Collection Time: 12/29/21  5:36 PM  ?Result Value Ref Range  ? Troponin I (High Sensitivity) 4 <18 ng/L  ?Protime-INR (order if Patient is taking Coumadin / Warfarin)     Status: None  ? Collection Time: 12/29/21  5:36 PM  ?Result Value Ref Range  ? Prothrombin Time 13.3 11.4 - 15.2 seconds  ? INR 1.0 0.8 - 1.2  ?Hepatic function panel     Status: None  ? Collection Time: 12/29/21  5:36 PM  ?Result Value Ref Range  ? Total Protein 7.0 6.5 - 8.1 g/dL  ? Albumin 4.4 3.5 - 5.0 g/dL  ? AST 20 15 - 41 U/L  ? ALT 17 0 - 44 U/L  ? Alkaline Phosphatase 70 38 - 126 U/L  ? Total Bilirubin 0.5 0.3 - 1.2 mg/dL  ? Bilirubin, Direct 0.1 0.0 - 0.2 mg/dL  ? Indirect Bilirubin 0.4 0.3 - 0.9 mg/dL  ?Brain natriuretic peptide     Status: None  ? Collection Time: 12/29/21  5:36 PM   ?Result Value Ref Range  ? B Natriuretic Peptide 32.1 0.0 - 100.0 pg/mL  ?Troponin I (High Sensitivity)     Status: None  ? Collection Time: 12/29/21  7:06 PM  ?Result Value Ref Range  ? Troponin I (H

## 2021-12-30 NOTE — Evaluation (Signed)
Clinical/Bedside Swallow Evaluation ?Patient Details  ?Name: Tracey [name] ?MRN: [id number] ?Date of Birth: 04-27-1946 ? ?Today's Date: 12/30/2021 ?Time: SLP Start Time (ACUTE ONLY): 6761 SLP Stop Time (ACUTE ONLY): 1310 ?SLP Time Calculation (min) (ACUTE ONLY): 25 min ? ?Past Medical History:  ?Past Medical History:  ?Diagnosis Date  ? Allergy   ? Anxiety   ? takes Ativan daily prn  ? Arthritis   ? Breast cancer ([place]) 2014  ? ER+/PR+/HEr2-,   ? Bruises easily   ? Colon polyps   ? 2 polyps by report  ? Coronary artery disease involving native coronary artery of native heart without angina pectoris 01/01/2014  ? NSTEMI in January 2019 in [place]>> s/p DES x2 to the LAD (procedure complicated by stent thrombosis) Myoview 05/2020: EF 72, no infarct or ischemia; low risk Echocardiogram 05/2019: EF 60-65, GLS -21.5 Cardiac catheterization at [facility] in 08/2017: Normal left main, normal LCx, normal RCA; LAD/diagonal 95%>> PCI   ? Diverticulosis   ? GERD (gastroesophageal reflux disease)   ? occasionally takes Nexium   ? Gout   ? takes Allopurinol daily and Colchicine daily prn;last attack 20yr ago  ? Gout   ? History of bladder infections   ? many yrs ago  ? History of bronchitis   ? last time at least 820yrago  ? History of stress incontinence   ? Hyperlipidemia   ? takes Pravastatin daily  ? Hypertension   ? Insomnia   ? takes Ambien nightly prn  ? [place]ST [place]  ? OSA (obstructive sleep apnea)   ? on CPAP  ? Osteoarthritis   ? Peripheral edema   ? takes Furosemide daily prn  ? PONV (postoperative nausea and vomiting)   ? Postmenopausal hormone therapy   ? Radiation 07/27/13-09/07/13  ? Right Breast x 31 treatments  ? Rhinitis   ? uses Flonase prn  ? Sinus congestion   ? Status post breast reconstruction 10/15/2014  ? [place]  ? Tachycardia   ? takes Metoprolol daily  ? Ventricular tachycardia,  non-sustained (HCC)   ? during sleep study 2009 with normal cardiac workup  ? ?Past Surgical History:  ?Past Surgical History:  ?Procedure Laterality Date  ? ABDOMINAL HYSTERECTOMY    ? with BSO  ? APPENDECTOMY    ? AXILLARY SENTINEL NODE BIOPSY Right 05/23/2013  ? Procedure: AXILLARY SENTINEL NODE BIOPSY;  Surgeon: ToOdis HollingsheadMD;  [facility] Service: General;  Laterality: Right;  nuc med injection 7:00  ? BREAST RECONSTRUCTION WITH PLACEMENT OF TISSUE EXPANDER AND FLEX HD (ACELLULAR HYDRATED DERMIS) Bilateral 05/23/2013  ? Procedure: BILATERAL BREAST RECONSTRUCTION WITH PLACEMENT OF TISSUE EXPANDER AND FLEX HD;  Surgeon: DaCrissie ReeseMD;  [facility] Service: Plastics;  Laterality: Bilateral;  ? CARDIAC CATHETERIZATION  1999  ? CATARACT EXTRACTION, BILATERAL Bilateral   ? CHOLECYSTECTOMY    ? COSMETIC SURGERY    ? OTHER SURGICAL HISTORY  09/2017  ? had cyst removal from spine   ? PERCUTANEOUS CORONARY STENT INTERVENTION (PCI-S)  08[place]2017  [place]  ? REMOVAL OF BILATERAL TISSUE EXPANDERS WITH PLACEMENT OF BILATERAL BREAST IMPLANTS Bilateral 05/17/2014  ? Procedure: REMOVAL OF BILATERAL TISSUE EXPANDERS WITH PLACEMENT OF BILATERAL BREAST IMPLANTS FOR RECONSTRUCTION;  Surgeon: DaCrissie ReeseMD;  [facility] Service: [facility]astics;  Laterality: Bilateral;  ? TONSILLECTOMY    ? TOTAL KNEE  ARTHROPLASTY Right 07/26/2018  ? Procedure: TOTAL KNEE ARTHROPLASTY;  Surgeon: Paralee Cancel, MD;  Location: [facility];  Service: Orthopedics;  Laterality: Right;  70 mins  ? TOTAL KNEE ARTHROPLASTY Left 12/02/2021  ? Procedure: TOTAL KNEE ARTHROPLASTY;  Surgeon: Paralee Cancel, MD;  Location: [facility];  Service: Orthopedics;  Laterality: Left;  ? TOTAL MASTECTOMY Bilateral 05/23/2013  ? Procedure: TOTAL MASTECTOMY;  Surgeon: [name], MD;  Location: [place];  Service: General;  Laterality: Bilateral;  ? ?HPI:  ?76yo female admitted 12/29/21 with mid back pain and SOB x3 days. PMH: CAD s/p stenting, HTN,  HLD, breast cancer, GERD, Gout.  ?  ?Assessment / Plan / Recommendation  ?Clinical Impression ? Pt seen at bedside for evaluation of swallow function. Pt was awake, alert, pleasant and cooperative. Husband present during this evaluation. Pt presents with adequate natural dentition. CN exam is unremarkable with the exception of reduced left eye closure. Pt reports globus sensation on multiple consistencies. She has had her esophagus dilated, but it was several years ago. She reports taking a PPI daily. Pt tolerated trials of thin liquid, puree, and solid texture. No obvious oral issues noted, and no overt s/s aspiration on any consistency given. Based on pt's history and presentation today, a primary esophageal dysphagia is suspected. Recommend a regular barium swallow (esophagram) to objectively assess esophageal (dys)motility. Pt was given written strategies for dietary and behavioral management of esophageal dysmotility. RN and MD informed. No further ST intervention recommended at this time. Please reconsult if needs arise. ? ?SLP Visit Diagnosis: Dysphagia, unspecified (R13.10) ?   ?Aspiration Risk ? Mild aspiration risk  ?  ?Diet Recommendation Regular;Thin liquid  ? ?Liquid Administration via: Cup;Straw ?Medication Administration: Whole meds with liquid ?Supervision: Patient able to self feed ?Compensations: Slow rate;Small sips/bites;Follow solids with liquid  ?  ?Other  Recommendations     ? ?Recommendations for follow up therapy are one component of a multi-disciplinary discharge planning process, led by the attending physician.  Recommendations may be updated based on patient status, additional functional criteria and insurance authorization. ? ?Follow up Recommendations No SLP follow up  ? ? ?  ?Assistance Recommended at Discharge None  ?Functional Status Assessment Patient has not had a recent decline in their functional status  ?   ? ?Prognosis Prognosis for Safe Diet Advancement: Good  ? ?  ? ?Swallow  Study   ?General Date of Onset: 12/29/21 ?HPI: 76yo female admitted 12/29/21 with mid back pain and SOB x3 days. PMH: CAD s/p stenting, HTN, HLD, breast cancer, GERD, Gout. ?Type of Study: Bedside Swallow Evaluation ?Previous Swallow Assessment: none ?Diet Prior to this Study: Regular;Thin liquids ?Temperature Spikes Noted: No ?Respiratory Status: Room air ?History of Recent Intubation: No ?Behavior/Cognition: Alert;Cooperative;Pleasant mood ?Oral Cavity Assessment: Within Functional Limits ?Oral Care Completed by SLP: No ?Oral Cavity - Dentition: Adequate natural dentition ?Vision: Functional for self-feeding ?Self-Feeding Abilities: Able to feed self ?Patient Positioning: Upright in bed ?Baseline Vocal Quality: Normal ?Volitional Cough: Strong ?Volitional Swallow: Able to elicit  ?  ?Oral/Motor/Sensory Function Overall Oral Motor/Sensory Function: Within functional limits   ?Ice Chips Ice chips: Not tested   ?Thin Liquid Thin Liquid: Within functional limits ?Presentation: Straw;Self Fed  ?  ?Nectar Thick Nectar Thick Liquid: Not tested   ?Honey Thick Honey Thick Liquid: Not tested   ?Puree Puree: Within functional limits ?Presentation: Spoon;Self Fed   ?Solid ? ? ?  Solid: Within functional limits ?Presentation: Self Fed  ? ?  ?Constanza Mincy B. Shavell Nored,  MSP, CCC-SLP ?Speech Language Pathologist ?Office: [phone] ? ?Shonna Chock ?12/30/2021,1:51 PM ? ? ? ?

## 2021-12-30 NOTE — Progress Notes (Addendum)
Same day as admission rounding note. ? ?Patient admitted after midnight with AKI in the setting of recent worsening dysphagia to both solid and liquid foods, occasionally regurgitating of oral intake.  She also reported significant bouts of chest pain with cardiac etiology ruled out on admission.  She reported having an EGD in [place] a few years ago where they dilated her distal esophagus.   ? ? ?Patient seen on rounds with husband and daughter at bedside.  She reports even having problems swallowing water today, states usually it is just food.  She had significant bouts of chest pain as well.  Reports not being able to tolerate much p.o. intake since Friday. ? ?Exam -awake, alert, no acute distress ?Lungs clear bilaterally, normal respiratory effort ?Heart regular rate and rhythm, no peripheral edema ?Abdomen soft nontender nondistended no rebound or guarding ?Extremities moves all, no edema normal tone ?Neuro grossly nonfocal exam, normal speech ?Skin dry intact no rashes seen on visualized skin ?Musculoskeletal hypertonic lumbar paraspinal muscles bilaterally ? ?Assessment and plan -please see H&P for full detailed assessment and plan with changes or additions as outlined below. ? ?Esophageal dysphagia -with history of distal esophageal dilation by EGD few years ago on [place].   ?-- SLP consult for swallow eval ?--Consulted GI, Dr. Benson Norway ?--Esophagram recommended ?--Likely needs EGD, follow-up GI recommendations ?--Continue IV fluids until p.o. intake improves, reduce rate to 75 cc/h ? ?Low back pain -seems due to muscle spasms, has palpable hypertonicity of the lumbar paraspinals.  Patient reports history of hallucinations with muscle relaxers specifically Flexeril and Zanaflex. ?-- Trial Robaxin ? ??  Hemodilution -hemoglobin dropped 11.7 down to 9.5, WBC and platelets are also down in the setting of IV hydration for AKI.  No signs of any bleeding. ?-- Monitor CBC ? ? ?No charge ?

## 2021-12-31 ENCOUNTER — Telehealth: Payer: Self-pay | Admitting: *Deleted

## 2021-12-31 DIAGNOSIS — N179 Acute kidney failure, unspecified: Secondary | ICD-10-CM | POA: Diagnosis not present

## 2021-12-31 LAB — BASIC METABOLIC PANEL
Anion gap: 8 (ref 5–15)
BUN: 30 mg/dL — ABNORMAL HIGH (ref 8–23)
CO2: 24 mmol/L (ref 22–32)
Calcium: 9.4 mg/dL (ref 8.9–10.3)
Chloride: 110 mmol/L (ref 98–111)
Creatinine, Ser: 1.18 mg/dL — ABNORMAL HIGH (ref 0.44–1.00)
GFR, Estimated: 48 mL/min — ABNORMAL LOW (ref >60)
Glucose, Bld: 98 mg/dL (ref 70–99)
Potassium: 4.3 mmol/L (ref 3.5–5.1)
Sodium: 142 mmol/L (ref 135–145)

## 2021-12-31 LAB — CBC
HCT: 31.7 % — ABNORMAL LOW (ref 36.0–46.0)
Hemoglobin: 10.7 g/dL — ABNORMAL LOW (ref 12.0–15.0)
MCH: 31.2 pg (ref 26.0–34.0)
MCHC: 33.8 g/dL (ref 30.0–36.0)
MCV: 92.4 fL (ref 80.0–100.0)
Platelets: 213 10*3/uL (ref 150–400)
RBC: 3.43 MIL/uL — ABNORMAL LOW (ref 3.87–5.11)
RDW: 13.5 % (ref 11.5–15.5)
WBC: 4.6 10*3/uL (ref 4.0–10.5)
nRBC: 0 % (ref 0.0–0.2)

## 2021-12-31 LAB — MAGNESIUM: Magnesium: 1.7 mg/dL (ref 1.7–2.4)

## 2021-12-31 MED ORDER — METHOCARBAMOL 500 MG PO TABS
500.0000 mg | ORAL_TABLET | Freq: Three times a day (TID) | ORAL | 0 refills | Status: AC | PRN
Start: 2021-12-31 — End: ?

## 2021-12-31 NOTE — Progress Notes (Signed)
The patient is alert, oriented x4, ambulatory without assistance. Discharge instructions were reviewed. Pt/Spouse denied questions, concerns at this time. Additional education provided regarding use of Methocarbamol and serious/common adverse reactions. Pt encouraged to discontinue use and contact provider should symptoms arise. ?

## 2021-12-31 NOTE — Telephone Encounter (Signed)
? ?  Pre-operative Risk Assessment  ?  ?Patient Name: Tracey [name]  ?DOB: [birth year] ?MRN: [id number]  ?LOOKS TO BE THE PT IS CURRENTLY ADMITTED SINCE 12/29/21 ?  ?Request for Surgical Clearance   ? ?Procedure:   EGD WITH DILATION ? ?Date of Surgery:  Clearance 01/08/22                              ?   ?Surgeon:  DR. HUNG ?Surgeon's Group or Practice Name:  [facility] ?Phone number:  [phone] ?Fax number: [phone]  ?  ?Type of Clearance Requested:   ?- Medical  ?- Pharmacy:  Hold Clopidogrel (Plavix) (COMMENT ON CLEARANCE PT IS CONCERNED ABOUT STOPPING HER PLAVIX AGAIN AFTER HAVING KNEE SURGERY LAST MONTH) ?  ?Type of Anesthesia:   PROPOFOL ?  ?Additional requests/questions:   ? ?Signed, ?Julaine Hua   ?12/31/2021, 11:01 AM  ? ?

## 2021-12-31 NOTE — TOC Progression Note (Signed)
Transition of Care (TOC) - Progression Note  ? ? ?Patient Details  ?Name: Tracey [name] ?MRN: [id number] ?Date of Birth: Jan 04, 1946 ? ?Transition of Care (TOC) CM/SW Contact  ?Purcell Mouton, RN ?Phone Number: ?12/31/2021, 9:53 AM ? ?Clinical Narrative:    ? ?Transition of Care (TOC) Screening Note ? ? ?Patient Details  ?Name: Tracey [name] ?Date of Birth: 12/02/1945 ? ? ?Transition of Care (TOC) CM/SW Contact:    ?Purcell Mouton, RN ?Phone Number: ?12/31/2021, 9:53 AM ? ? ? ?Transition of Care Department [facility]) has reviewed patient and no TOC needs have been identified at this time. We will continue to monitor patient advancement through interdisciplinary progression rounds. If new patient transition needs arise, please place a TOC consult. ?  ? ? ?  ?  ? ?Expected Discharge Plan and Services ?  ?  ?  ?  ?  ?Expected Discharge Date: 12/31/21               ?  ?  ?  ?  ?  ?  ?  ?  ?  ?  ? ? ?Social Determinants of Health (SDOH) Interventions ?  ? ?Readmission Risk Interventions ?   ? View : No data to display.  ?  ?  ?  ? ? ?

## 2021-12-31 NOTE — Discharge Summary (Signed)
Physician Discharge Summary  ?Tracey [name] JGG:[id number] DOB: [birth year] DOA: 12/29/2021 ? ?PCP: [name], MD ? ?Admit date: 12/29/2021 ?Discharge date: 12/31/2021 ? ?Admitted From: Home  ?Disposition:  Home  ? ?Recommendations for Outpatient Follow-up:  ?Follow up with PCP in 1-2 weeks ?Please obtain BMP/CBC in one week ?She will need to follow up with Dr Benson Norway for endoscopy and dilation.  ?She was advised to stop taking Plavix on Saturday in anticipation of endoscopy 5/25. ?She will need referral to nephrologist for CKD.  ? ? ?Discharge Condition: Stable.  ?CODE STATUS:Full code ?Diet recommendation: Heart Healthy ? ?Brief/Interim Summary: 76 year old with past medical history significant for CAD status post stenting, hypertension, hyperlipidemia, history of breast cancer presents to the ER complaining of mid back pain shortness of breath ongoing for the last 3 days.  She also had an episode of diarrhea.  She presented feeling weak and fatigued.  In the ED the CT angio chest was negative for PE did show some rib fractures which are subacute.  She presented with AKI with a creatinine of 1.8. ? ?She presented with worsening AKI in the setting of poor oral intake, complaining of worsening dysphagia. ? ? ?1-Esophageal dysphagia:  ?Esophagogram showed. A 13 mm barium tab failed to pass the GE junction consistent with ?significant stricturing of the distal esophagus just above the GE ?junction. Consider endoscopy for further evaluation ?She received plavix. She will need to be off plavix for 5 days prior to  endoscopy/ dilation.  ?Ok to hold Plavix for five days per Dr Burt Knack. .  ? ?2-AKI in the setting of dehydration. Resolved. Cr down to 1.1. ?Presented with CR 1.8 ?Improved with hydration.  ?She has some component of CKD. She will need referral to nephrology.  ? ?3-Low Back pain: She will be discharged on Robaxin. Further evaluation by PCP.  ? ?4-Anemia: Hemodilution. She will follow up with GI for endoscopy.   ? ? ?Discharge Diagnoses:  ?Principal Problem: ?  AKI (acute kidney injury) ([place]) ?Active Problems: ?  Breast cancer of upper-outer quadrant of right female breast ([place]) ?  Essential hypertension ?  OSA on CPAP ?  Hyperlipidemia LDL goal <70 ?  SOB (shortness of breath) ?  ARF (acute renal failure) ([place]) ? ? ? ?Discharge Instructions ? ? ?Allergies as of 12/31/2021   ? ?   Reactions  ? Pseudoeph-hydrocodone-gg Other (See Comments)  ? Ivp Dye [iodinated Contrast Media] Hives  ? Oxycodone Other (See Comments)  ? hallucinations  ? Sulfa Antibiotics Swelling  ? Tape Other (See Comments)  ? Unknown; tears skin ; ok to use paper tape   ? ?  ? ?  ?Medication List  ?  ? ?STOP taking these medications   ? ?amoxicillin 500 MG tablet ?Commonly known as: AMOXIL ?  ?apraclonidine 0.5 % ophthalmic solution ?Commonly known as: IOPIDINE ?  ?Aspirin Low Dose 81 MG chewable tablet ?Generic drug: aspirin ?  ?celecoxib 200 MG capsule ?Commonly known as: CELEBREX ?  ?clopidogrel 75 MG tablet ?Commonly known as: PLAVIX ?  ?colchicine 0.6 MG tablet ?  ?cyclobenzaprine 10 MG tablet ?Commonly known as: FLEXERIL ?  ?docusate sodium 100 MG capsule ?Commonly known as: Colace ?  ?lisinopril 10 MG tablet ?Commonly known as: ZESTRIL ?  ? ?  ? ?TAKE these medications   ? ?allopurinol 100 MG tablet ?Commonly known as: ZYLOPRIM ?TAKE 1 TABLET BY MOUTH ONCE DAILY ?  ?cyanocobalamin 1000 MCG/ML injection ?Commonly known as: (VITAMIN B-12) ?Inject 1,000 mcg into  the muscle every 30 (thirty) days. ?  ?ezetimibe 10 MG tablet ?Commonly known as: ZETIA ?TAKE 1 TABLET(10 MG) BY MOUTH DAILY ?What changed: See the new instructions. ?  ?fluticasone 50 MCG/ACT nasal spray ?Commonly known as: FLONASE ?Place 1 spray into both nostrils daily. ?  ?furosemide 40 MG tablet ?Commonly known as: LASIX ?Take 40 mg by mouth daily as needed for fluid. ?  ?loratadine 10 MG tablet ?Commonly known as: CLARITIN ?Take 10 mg by mouth daily. ?  ?metoprolol succinate 25 MG 24 hr  tablet ?Commonly known as: TOPROL-XL ?TAKE 1 TABLET(25 MG) BY MOUTH DAILY ?What changed: See the new instructions. ?  ?Multi-Vitamin tablet ?Take 1 tablet by mouth daily. ?  ?nitroGLYCERIN 0.4 MG SL tablet ?Commonly known as: NITROSTAT ?Place 1 tablet (0.4 mg total) under the tongue every 5 (five) minutes as needed for chest pain. ?  ?NONFORMULARY OR COMPOUNDED ITEM ?Vitamin E vaginal cream 200u/ml.  One ml pv three times weekly. ?What changed:  ?how much to take ?how to take this ?when to take this ?reasons to take this ?additional instructions ?  ?pantoprazole 40 MG tablet ?Commonly known as: PROTONIX ?TAKE 1 TABLET(40 MG) BY MOUTH DAILY ?What changed: See the new instructions. ?  ?polyethylene glycol 17 g packet ?Commonly known as: MIRALAX / GLYCOLAX ?Take 17 g by mouth daily as needed for mild constipation. ?  ?rosuvastatin 20 MG tablet ?Commonly known as: CRESTOR ?Take 1 tablet (20 mg total) by mouth daily. ?  ?sertraline 100 MG tablet ?Commonly known as: ZOLOFT ?Take 100 mg by mouth daily. ?  ?testosterone cypionate 200 MG/ML injection ?Commonly known as: DEPOTESTOSTERONE CYPIONATE ?Inject 200 mg into the muscle See admin instructions. Inject '200mg'$  into the muscle every 45 days ?  ?Vicks DayQuil/NyQuil Cough 6.25-15 & 15 MG/15ML Lqpk ?Generic drug: Doxylamine-DM & DM ?Take 15 mLs by mouth every 6 (six) hours as needed (allergy symptoms). ?  ?Vitamin D3 50 MCG (2000 UT) Tabs ?Take 2,000 Units by mouth daily. ?  ? ?  ? ? ?Allergies  ?Allergen Reactions  ? Pseudoeph-Hydrocodone-Gg Other (See Comments)  ? Ivp Dye [Iodinated Contrast Media] Hives  ? Oxycodone Other (See Comments)  ?  hallucinations  ? Sulfa Antibiotics Swelling  ? Tape Other (See Comments)  ?  Unknown; tears skin ; ok to use paper tape   ? ? ?Consultations: ?GI ? ? ?Procedures/Studies: ?CT Angio Chest PE W and/or Wo Contrast ? ?Result Date: 12/29/2021 ?CLINICAL DATA:  High probability for pulmonary embolism. Chest pain and shortness of breath. EXAM:  CT ANGIOGRAPHY CHEST WITH CONTRAST TECHNIQUE: Multidetector CT imaging of the chest was performed using the standard protocol during bolus administration of intravenous contrast. Multiplanar CT image reconstructions and MIPs were obtained to evaluate the vascular anatomy. RADIATION DOSE REDUCTION: This exam was performed according to the departmental dose-optimization program which includes automated exposure control, adjustment of the mA and/or kV according to patient size and/or use of iterative reconstruction technique. CONTRAST:  58m OMNIPAQUE IOHEXOL 350 MG/ML SOLN COMPARISON:  CT angiogram chest 08/17/2018. FINDINGS: Cardiovascular: Satisfactory opacification of the pulmonary arteries to the segmental level. No evidence of pulmonary embolism. Normal heart size. No pericardial effusion. There are atherosclerotic calcifications of the aorta. Mediastinum/Nodes: Small hiatal hernia is present. There is mild diffuse esophageal wall thickening similar to the prior study. Lungs/Pleura: There is a stable 4 mm ground-glass nodule in the left upper lobe image 6/38. There is no focal lung infiltrate, pleural effusion or pneumothorax. There is minimal pleural  scarring in the anterior right upper lobe similar to the prior study. There are 2 adjacent nodules in the left lower lobe measuring up to 4 mm image 6/85 which are unchanged from prior study. Upper Abdomen: Cholecystectomy clips are present. Musculoskeletal: There are subacute appearing left sixth, seventh and eighth rib fractures. Multilevel degenerative changes affect the spine. Right axillary and chest wall surgical clips are present. Review of the MIP images confirms the above findings. IMPRESSION: 1. No evidence for pulmonary embolism. 2. Nodular densities measuring up to 4 mm are unchanged from 2020 favored as benign. 3. Subacute left sixth, seventh and eighth rib fractures. Correlate clinically. Aortic Atherosclerosis (ICD10-I70.0). Electronically Signed    By: Ronney Asters M.D.   On: 12/29/2021 22:14  ? ?CT RENAL STONE STUDY ? ?Result Date: 12/30/2021 ?CLINICAL DATA:  Flank pain, shortness of breath EXAM: CT ABDOMEN AND PELVIS WITHOUT CONTRAST TECHNIQUE: [place]

## 2022-01-01 NOTE — Telephone Encounter (Signed)
Pt at low risk of holding plavix for the procedure. OK to do so x 5 -7 days as needed. Ideally would continue on ASA 81 mg if this doesn't place her at too high of bleeding risk. thx

## 2022-01-01 NOTE — Telephone Encounter (Signed)
   Patient Name: Tracey [name]  DOB: 02/28/1946 MRN: [id number]  Primary Cardiologist: Sherren Mocha, MD  Chart reviewed as part of pre-operative protocol coverage. Patient was recently seen in clinic for preop evaluation for unrelated procedure 11/19/21 and was cleared by APP at that visit. She was granted permission to hold Plavix for up to 7 days prior to surgery if needed, otherwise to continue ASA. She has since been admitted 515-5/17 with back pain, SOB, diarrhea, dysphagia. She was found to have AKI and dysphagia. Troponins were negative. She was treated with hydration and diagnosed with esophageal dysphagia and stricture. She is now pending clearance for dilation 01/08/22. The discharge note from Dr. Tyrell Antonio indicates "OK to hold Plavix for 5 days per Dr. Burt Knack." I was not sure if Dr. Burt Knack was curbsided during the hospitalization so will reach out to ensure he is OK with her proceeding as requested - The patient did express concern about stopping her Plavix last month and then again this month so MD input is requested for clarification given complaints of SOB during hospitalization. Dr. Burt Knack - Please route response to P CV DIV PREOP (the pre-op pool). Thank you.  Charlie Pitter, PA-C 01/01/2022, 9:33 AM

## 2022-01-01 NOTE — Telephone Encounter (Signed)
   Patient Name: Tracey [name]  DOB: 1945-10-01 MRN: [id number]  Primary Cardiologist: Sherren Mocha, MD  Chart reviewed as part of pre-operative protocol coverage. Per review with Dr. Burt Knack, "Pt at low risk of holding plavix for the procedure. OK to do so x 5 -7 days as needed. Ideally would continue on ASA 81 mg if this doesn't place her at too high of bleeding risk." I confirmed with him via secure chat he is OK with her medically proceeding.  Will route this bundled recommendation to requesting provider via Epic fax function. Please call with questions.   Charlie Pitter, PA-C 01/01/2022, 12:51 PM

## 2022-01-05 DIAGNOSIS — M25562 Pain in left knee: Secondary | ICD-10-CM | POA: Diagnosis not present

## 2022-01-05 DIAGNOSIS — N179 Acute kidney failure, unspecified: Secondary | ICD-10-CM | POA: Diagnosis not present

## 2022-01-05 DIAGNOSIS — M545 Low back pain, unspecified: Secondary | ICD-10-CM | POA: Diagnosis not present

## 2022-01-05 DIAGNOSIS — R3915 Urgency of urination: Secondary | ICD-10-CM | POA: Diagnosis not present

## 2022-01-05 DIAGNOSIS — I119 Hypertensive heart disease without heart failure: Secondary | ICD-10-CM | POA: Diagnosis not present

## 2022-01-05 DIAGNOSIS — N1832 Chronic kidney disease, stage 3b: Secondary | ICD-10-CM | POA: Diagnosis not present

## 2022-01-05 DIAGNOSIS — D649 Anemia, unspecified: Secondary | ICD-10-CM | POA: Diagnosis not present

## 2022-01-05 DIAGNOSIS — R1319 Other dysphagia: Secondary | ICD-10-CM | POA: Diagnosis not present

## 2022-01-07 DIAGNOSIS — M25562 Pain in left knee: Secondary | ICD-10-CM | POA: Diagnosis not present

## 2022-01-08 DIAGNOSIS — K222 Esophageal obstruction: Secondary | ICD-10-CM | POA: Diagnosis not present

## 2022-01-08 DIAGNOSIS — K449 Diaphragmatic hernia without obstruction or gangrene: Secondary | ICD-10-CM | POA: Diagnosis not present

## 2022-01-08 DIAGNOSIS — K2289 Other specified disease of esophagus: Secondary | ICD-10-CM | POA: Diagnosis not present

## 2022-01-08 DIAGNOSIS — R1310 Dysphagia, unspecified: Secondary | ICD-10-CM | POA: Diagnosis not present

## 2022-01-08 DIAGNOSIS — R933 Abnormal findings on diagnostic imaging of other parts of digestive tract: Secondary | ICD-10-CM | POA: Diagnosis not present

## 2022-01-09 DIAGNOSIS — N390 Urinary tract infection, site not specified: Secondary | ICD-10-CM | POA: Diagnosis not present

## 2022-01-14 DIAGNOSIS — M25562 Pain in left knee: Secondary | ICD-10-CM | POA: Diagnosis not present

## 2022-01-16 DIAGNOSIS — R509 Fever, unspecified: Secondary | ICD-10-CM | POA: Diagnosis not present

## 2022-01-16 DIAGNOSIS — I119 Hypertensive heart disease without heart failure: Secondary | ICD-10-CM | POA: Diagnosis not present

## 2022-01-16 DIAGNOSIS — Z1152 Encounter for screening for COVID-19: Secondary | ICD-10-CM | POA: Diagnosis not present

## 2022-01-16 DIAGNOSIS — N390 Urinary tract infection, site not specified: Secondary | ICD-10-CM | POA: Diagnosis not present

## 2022-01-16 DIAGNOSIS — R3915 Urgency of urination: Secondary | ICD-10-CM | POA: Diagnosis not present

## 2022-01-16 DIAGNOSIS — R051 Acute cough: Secondary | ICD-10-CM | POA: Diagnosis not present

## 2022-01-16 DIAGNOSIS — N1832 Chronic kidney disease, stage 3b: Secondary | ICD-10-CM | POA: Diagnosis not present

## 2022-01-16 DIAGNOSIS — M25562 Pain in left knee: Secondary | ICD-10-CM | POA: Diagnosis not present

## 2022-01-20 DIAGNOSIS — N1832 Chronic kidney disease, stage 3b: Secondary | ICD-10-CM | POA: Diagnosis not present

## 2022-01-20 DIAGNOSIS — M25562 Pain in left knee: Secondary | ICD-10-CM | POA: Diagnosis not present

## 2022-01-20 DIAGNOSIS — R3915 Urgency of urination: Secondary | ICD-10-CM | POA: Diagnosis not present

## 2022-01-20 DIAGNOSIS — R509 Fever, unspecified: Secondary | ICD-10-CM | POA: Diagnosis not present

## 2022-01-20 DIAGNOSIS — I119 Hypertensive heart disease without heart failure: Secondary | ICD-10-CM | POA: Diagnosis not present

## 2022-01-21 DIAGNOSIS — Z96652 Presence of left artificial knee joint: Secondary | ICD-10-CM | POA: Diagnosis not present

## 2022-01-21 DIAGNOSIS — Z471 Aftercare following joint replacement surgery: Secondary | ICD-10-CM | POA: Diagnosis not present

## 2022-01-28 ENCOUNTER — Ambulatory Visit (INDEPENDENT_AMBULATORY_CARE_PROVIDER_SITE_OTHER): Payer: Medicare Other | Admitting: Internal Medicine

## 2022-01-28 ENCOUNTER — Other Ambulatory Visit: Payer: Self-pay

## 2022-01-28 ENCOUNTER — Encounter: Payer: Self-pay | Admitting: Internal Medicine

## 2022-01-28 VITALS — BP 102/68 | HR 68 | Temp 98.1°F | Wt 136.0 lb

## 2022-01-28 DIAGNOSIS — R509 Fever, unspecified: Secondary | ICD-10-CM

## 2022-01-28 NOTE — Progress Notes (Signed)
Patient: Tracey [name]  DOB: 1946/06/17 MRN: [id number] PCP: [name], MD  Referring Provider: Caprice Beaver, NP  Chief Complaint  Patient presents with   New Patient (Initial Visit)     Patient Active Problem List   Diagnosis Date Noted   ARF (acute renal failure) ([place]) 12/30/2021   SOB (shortness of breath) 12/29/2021   S/P total knee arthroplasty, left 12/02/2021   Preoperative cardiovascular examination 11/18/2021   REM behavioral disorder 06/12/2021   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   CPAP (continuous positive airway pressure) dependence 01/20/2021   Unsteady gait when walking 01/20/2021   Complaint related to dreams 37/11/8887   Metabolic acidosis with increased anion gap and accumulation of organic acids 08/17/2018   Nausea, vomiting, and diarrhea 08/17/2018   AKI (acute kidney injury) ([place]) 08/17/2018   Overweight (BMI 25.0-29.9) 07/27/2018   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/27/2018   S/P right TKA 07/26/2018   S/P knee replacement 07/26/2018   UARS (upper airway resistance syndrome) 05/30/2018   Hyperlipidemia LDL goal <70 05/02/2018   Leg injury, right, initial encounter 01/06/2017   Contusion of right lower leg 01/06/2017   Hypoxemia 12/23/2015   Statin myopathy 05/20/2015   MCI (mild cognitive impairment) 05/20/2015   Edema 05/17/2015   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 F with PMHx with as below and Left TKA on 12/02/21 presents  for FUO by Caprice Beaver, PA . Pthad temp of 100.2 on 6/2 at PCP clinic with no leukocytosis and blood Cx(6/6) with suboptimal collection. On 6/2 visit left knee pain with  cough, fever. She was placed on Doxy by PCP for potential tick borne illness. Per PCP note on 6/6 she had home temp of 100.5(99.4 in clinic). Followed with Dr. Arlington Calix)  last week. Pt reports she had a tap last week which did not show abnormalities. Today she presents with daughter. PT is teary eyed and reports her knee hurts. 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 She is concerned that she has not much energy since her left TKA on 12/02/21. Reports having low grade fevers since surgery. With a one time fever over >100.4. Review of Systems  All other systems reviewed and are negative.   Past Medical History:  Diagnosis Date   Allergy    Anxiety    takes Ativan daily prn   Arthritis    Breast cancer ([place]) 2014   ER+/PR+/HEr2-,    Bruises easily    Colon polyps    2 polyps by report   Coronary artery disease involving native coronary artery of native heart without angina pectoris 01/01/2014   NSTEMI in January 2019 in [place]>> s/p DES x2 to the LAD (procedure complicated by stent thrombosis) Myoview 05/2020: EF 72, no infarct or ischemia; low risk Echocardiogram 05/2019: EF 60-65, GLS -21.5 Cardiac catheterization at [facility] in 08/2017: Normal left main, normal LCx, normal RCA; LAD/diagonal 95%>> PCI  Diverticulosis    GERD (gastroesophageal reflux disease)    occasionally takes Nexium    Gout    takes Allopurinol daily and Colchicine daily prn;last attack 24yr ago   Gout    History of bladder infections    many yrs ago   History of bronchitis    last time at least 819yrago   History of stress incontinence    Hyperlipidemia    takes Pravastatin daily   Hypertension    Insomnia    takes Ambien nightly prn   NSTEMI (non-ST elevated myocardial infarction) ([place]   OSA  (obstructive sleep apnea)    on CPAP   Osteoarthritis    Peripheral edema    takes Furosemide daily prn   PONV (postoperative nausea and vomiting)    Postmenopausal hormone therapy    Radiation 07/27/13-09/07/13   Right Breast x 31 treatments   Rhinitis    uses Flonase prn   Sinus congestion    Status post breast reconstruction 10/15/2014   Bilateral implant removal and DIEP performed in [place], [place]   Tachycardia    takes Metoprolol daily   Ventricular tachycardia, non-sustained ([place]   during sleep study 2009 with normal cardiac workup    Outpatient Medications Prior to Visit  Medication Sig Dispense Refill   allopurinol (ZYLOPRIM) 100 MG tablet TAKE 1 TABLET BY MOUTH ONCE DAILY (Patient taking differently: Take 100 mg by mouth daily.) 90 tablet 0   ASPIRIN LOW DOSE 81 MG chewable tablet Chew 81 mg by mouth daily.   0   Cholecalciferol (VITAMIN D3) 50 MCG (2000 UT) TABS Take 2,000 Units by mouth daily.     clopidogrel (PLAVIX) 75 MG tablet Take 1 tablet (75 mg total) by mouth daily. 90 tablet 3   cyanocobalamin (,VITAMIN B-12,) 1000 MCG/ML injection Inject 1,000 mcg into the muscle every 30 (thirty) days.     Doxylamine-DM & DM (VICKS DAYQUIL/NYQUIL COUGH) 6.25-15 & 15 MG/15ML LQPK Take 15 mLs by mouth every 6 (six) hours as needed (allergy symptoms).     ezetimibe (ZETIA) 10 MG tablet TAKE 1 TABLET(10 MG) BY MOUTH DAILY (Patient taking differently: Take 10 mg by mouth daily.) 90 tablet 2   fluticasone (FLONASE) 50 MCG/ACT nasal spray Place 1 spray into both nostrils daily.     furosemide (LASIX) 40 MG tablet Take 40 mg by mouth daily as needed for fluid.      loratadine (CLARITIN) 10 MG tablet Take 10 mg by mouth daily.     methocarbamol (ROBAXIN) 500 MG tablet Take 1 tablet (500 mg total) by mouth every 8 (eight) hours as needed for muscle spasms. 10 tablet 0   metoprolol succinate (TOPROL-XL) 25 MG 24 hr tablet TAKE 1 TABLET(25 MG) BY MOUTH DAILY (Patient taking differently: Take  25 mg by mouth daily.) 90 tablet 3   Multiple Vitamin (MULTI-VITAMIN) tablet Take 1 tablet by mouth daily.     nitroGLYCERIN (NITROSTAT) 0.4 MG SL tablet Place 1 tablet (0.4 mg total) under the tongue every 5 (five) minutes as needed for chest pain. 25 tablet 3   pantoprazole (PROTONIX) 40 MG tablet TAKE 1 TABLET(40 MG) BY MOUTH DAILY (Patient taking differently: Take 40 mg by mouth daily.) 90 tablet 3   polyethylene glycol (MIRALAX / GLYCOLAX) 17 g packet Take 17 g by mouth daily as needed for mild constipation. 14 each 0   rosuvastatin (CRESTOR) 20 MG tablet Take 1 tablet (20 mg total) by mouth daily. 90 tablet 3  sertraline (ZOLOFT) 100 MG tablet Take 100 mg by mouth daily.     testosterone cypionate (DEPOTESTOSTERONE CYPIONATE) 200 MG/ML injection Inject 200 mg into the muscle See admin instructions. Inject 239m into the muscle every 45 days     NONFORMULARY OR COMPOUNDED ITEM Vitamin E vaginal cream 200u/ml.  One ml pv three times weekly. (Patient not taking: Reported on 01/28/2022) 36 each 1   No facility-administered medications prior to visit.     Allergies  Allergen Reactions   Pseudoeph-Hydrocodone-Gg Other (See Comments)   Ivp Dye [Iodinated Contrast Media] Hives   Oxycodone Other (See Comments)    hallucinations   Sulfa Antibiotics Swelling   Tape Other (See Comments)    Unknown; tears skin ; ok to use paper tape     Social History   Tobacco Use   Smoking status: Former    Packs/day: 1.00    Years: 20.00    Total pack years: 20.00    Types: Cigarettes    Start date: 06/09/1992   Smokeless tobacco: Never   Tobacco comments:    quit at age 76 Vaping Use   Vaping Use: Never used  Substance Use Topics   Alcohol use: Yes    Alcohol/week: 7.0 - 14.0 standard drinks of alcohol    Types: 7 - 14 Glasses of wine per week    Comment: daily   Drug use: Yes    Types: Other-see comments    Comment: quit 24 years ago.   CBD oil topical.    Family History  Problem  Relation Age of Onset   Breast cancer Mother        possible inflammatory breast cancer   Heart attack Father    Heart disease Father    ALS Sister    Breast cancer Maternal Grandmother 650  Heart attack Maternal Grandfather    Heart attack Paternal Grandfather    Breast cancer Maternal Aunt        dx in her 567s  Breast cancer Other        maternal great grandmother; dx in her 423s   Objective:   Vitals:   01/28/22 1419  BP: 102/68  Pulse: 68  Temp: 98.1 F (36.7 C)  TempSrc: Oral  Weight: 136 lb (61.7 kg)   Body mass index is 24.87 kg/m.  Physical Exam Constitutional:      Appearance: Normal appearance.  HENT:     Head: Normocephalic and atraumatic.     Right Ear: Tympanic membrane normal.     Left Ear: Tympanic membrane normal.     Nose: Nose normal.     Mouth/Throat:     Mouth: Mucous membranes are moist.  Eyes:     Extraocular Movements: Extraocular movements intact.     Conjunctiva/sclera: Conjunctivae normal.     Pupils: Pupils are equal, round, and reactive to light.  Cardiovascular:     Rate and Rhythm: Normal rate and regular rhythm.     Heart sounds: No murmur heard.    No friction rub. No gallop.  Pulmonary:     Effort: Pulmonary effort is normal.     Breath sounds: Normal breath sounds.  Abdominal:     General: Abdomen is flat.     Palpations: Abdomen is soft.  Musculoskeletal:        General: Normal range of motion.  Skin:    General: Skin is warm and dry.  Neurological:     General: No focal deficit present.  Mental Status: She is alert and oriented to person, place, and time.  Psychiatric:        Mood and Affect: Mood normal.     Lab Results: Lab Results  Component Value Date   WBC 4.6 12/31/2021   HGB 10.7 (L) 12/31/2021   HCT 31.7 (L) 12/31/2021   MCV 92.4 12/31/2021   PLT 213 12/31/2021    Lab Results  Component Value Date   CREATININE 1.18 (H) 12/31/2021   BUN 30 (H) 12/31/2021   NA 142 12/31/2021   K 4.3 12/31/2021    CL 110 12/31/2021   CO2 24 12/31/2021    Lab Results  Component Value Date   ALT 14 12/30/2021   AST 14 (L) 12/30/2021   ALKPHOS 52 12/30/2021   BILITOT 0.4 12/30/2021     Assessment & Plan:  #Hx of fevers #Left TKA on 12/02/21 with Dr.  Alvan Dame -Pt reports she has not been feeling like  herself since the [place]. She has had increased fatigue and decrease desire to do thing. Her left knee is painful, but she cannot tolereate pain medication. She was seen at Ortho office this weeke, reprots her knee was aspirated with no abnormal findings reported. -She reports " low grade" fevers <100 since the surgery. One time temp of >100.4. She does not take tylenol for her symptoms. Per PCP visit on 6/2 pt had recorded temp of 100.2. there was an issue with the lbood Cx sample, as sch I will repeat Cx. -Pt was teary yed and crying for some of the visit as she is not able to do the things(golfing) as she would like. I will certainly do a workup meidcal work up and imaging of the knee to look for meidcal causes. She lives in any area with ticks, but has not niticed that she pulled one off lately. Notes that "years ago" she was treated for  a tick infection. She has taken doxy x 10 days Tx by PCP on 6/2 without any change in symptoms. Will ordered RMSF/Lyme serology, ANA, Blood Cx, labs(esr and crp), MRI left knee to look for possible etiology. Follow-up in one month and keep a temperature diary.  -I had a long discussion with with pt about some of her symptoms may be related to the fact she is pain, and not able to do the things she would like. Encouraged to continue PT, follow-up with ortho.   Laurice Record, MD [facility]   01/28/22  2:25 PM

## 2022-02-03 LAB — COMPLETE METABOLIC PANEL WITH GFR
AG Ratio: 1.6 (calc) (ref 1.0–2.5)
ALT: 14 U/L (ref 6–29)
AST: 19 U/L (ref 10–35)
Albumin: 4.1 g/dL (ref 3.6–5.1)
Alkaline phosphatase (APISO): 84 U/L (ref 37–153)
BUN/Creatinine Ratio: 28 (calc) — ABNORMAL HIGH (ref 6–22)
BUN: 35 mg/dL — ABNORMAL HIGH (ref 7–25)
CO2: 21 mmol/L (ref 20–32)
Calcium: 10.2 mg/dL (ref 8.6–10.4)
Chloride: 107 mmol/L (ref 98–110)
Creat: 1.27 mg/dL — ABNORMAL HIGH (ref 0.60–1.00)
Globulin: 2.6 g/dL (calc) (ref 1.9–3.7)
Glucose, Bld: 91 mg/dL (ref 65–99)
Potassium: 4.5 mmol/L (ref 3.5–5.3)
Sodium: 141 mmol/L (ref 135–146)
Total Bilirubin: 0.3 mg/dL (ref 0.2–1.2)
Total Protein: 6.7 g/dL (ref 6.1–8.1)
eGFR: 44 mL/min/{1.73_m2} — ABNORMAL LOW (ref >=60)

## 2022-02-03 LAB — CBC WITH DIFFERENTIAL/PLATELET
Absolute Monocytes: 512 cells/uL (ref 200–950)
Basophils Absolute: 32 cells/uL (ref 0–200)
Basophils Relative: 0.5 %
Eosinophils Absolute: 211 cells/uL (ref 15–500)
Eosinophils Relative: 3.3 %
HCT: 38.9 % (ref 35.0–45.0)
Hemoglobin: 12.6 g/dL (ref 11.7–15.5)
Lymphs Abs: 1722 cells/uL (ref 850–3900)
MCH: 29.2 pg (ref 27.0–33.0)
MCHC: 32.4 g/dL (ref 32.0–36.0)
MCV: 90 fL (ref 80.0–100.0)
MPV: 10.4 fL (ref 7.5–12.5)
Monocytes Relative: 8 %
Neutro Abs: 3923 cells/uL (ref 1500–7800)
Neutrophils Relative %: 61.3 %
Platelets: 240 10*3/uL (ref 140–400)
RBC: 4.32 10*6/uL (ref 3.80–5.10)
RDW: 12.7 % (ref 11.0–15.0)
Total Lymphocyte: 26.9 %
WBC: 6.4 10*3/uL (ref 3.8–10.8)

## 2022-02-03 LAB — CULTURE, BLOOD (SINGLE)
MICRO NUMBER:: 13525913
MICRO NUMBER:: 13525910
Result:: NO GROWTH
SPECIMEN QUALITY:: ADEQUATE

## 2022-02-03 LAB — ANTI-NUCLEAR AB-TITER (ANA TITER)
ANA TITER: 1:40 {titer} — ABNORMAL HIGH
ANA Titer 1: 1:320 {titer} — ABNORMAL HIGH

## 2022-02-03 LAB — ROCKY MTN SPOTTED FVR ABS PNL(IGG+IGM)
RMSF IgG: NOT DETECTED
RMSF IgM: NOT DETECTED

## 2022-02-03 LAB — SEDIMENTATION RATE: Sed Rate: 14 mm/h (ref 0–30)

## 2022-02-03 LAB — C-REACTIVE PROTEIN: CRP: 3.2 mg/L (ref <8.0)

## 2022-02-03 LAB — ANA: Anti Nuclear Antibody (ANA): POSITIVE — AB

## 2022-02-03 LAB — B. BURGDORFI ANTIBODIES: B burgdorferi Ab IgG+IgM: <0.9 index

## 2022-02-04 ENCOUNTER — Telehealth: Payer: Self-pay | Admitting: Internal Medicine

## 2022-02-04 ENCOUNTER — Encounter: Payer: Self-pay | Admitting: Internal Medicine

## 2022-02-04 ENCOUNTER — Ambulatory Visit (HOSPITAL_COMMUNITY)
Admission: RE | Admit: 2022-02-04 | Discharge: 2022-02-04 | Disposition: A | Discharge status: Home / Self Care | Payer: Medicare Other | Source: Ambulatory Visit | Attending: Internal Medicine | Admitting: Internal Medicine

## 2022-02-04 ENCOUNTER — Encounter (HOSPITAL_COMMUNITY): Payer: Self-pay | Admitting: Radiology

## 2022-02-04 DIAGNOSIS — M25462 Effusion, left knee: Secondary | ICD-10-CM | POA: Diagnosis not present

## 2022-02-04 DIAGNOSIS — R509 Fever, unspecified: Secondary | ICD-10-CM | POA: Diagnosis not present

## 2022-02-04 DIAGNOSIS — Z96642 Presence of left artificial hip joint: Secondary | ICD-10-CM | POA: Diagnosis not present

## 2022-02-04 DIAGNOSIS — M7989 Other specified soft tissue disorders: Secondary | ICD-10-CM | POA: Diagnosis not present

## 2022-02-04 DIAGNOSIS — R768 Other specified abnormal immunological findings in serum: Secondary | ICD-10-CM

## 2022-02-04 DIAGNOSIS — Z471 Aftercare following joint replacement surgery: Secondary | ICD-10-CM | POA: Diagnosis not present

## 2022-02-04 IMAGING — MR MR KNEE*L* W/O CM
7 series · 40 of 40 positions shown · non-contrast
Comparison: Radiograph [DATE].

CLINICAL DATA: Infection

EXAM:
MRI OF THE LEFT KNEE WITHOUT CONTRAST
TECHNIQUE: Multiplanar, multisequence MR imaging of the knee was performed. No
intravenous contrast was administered.

[Series 13: STIR · axial · left · 4.5mm · 0.62mm/px · z∈[-139,+13]mm · 6 of 28 slices shown (1 of 3)]
[im 1/28]
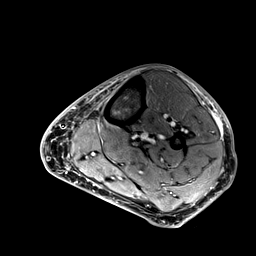
[im 6/28]
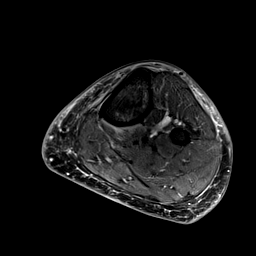
[im 11/28]
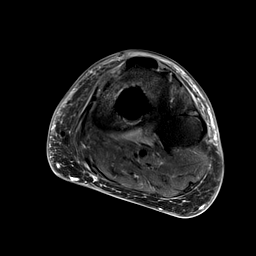
[im 17/28]
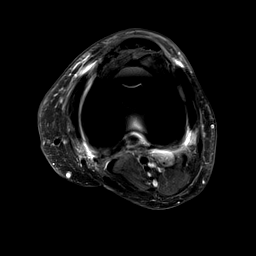
[im 22/28]
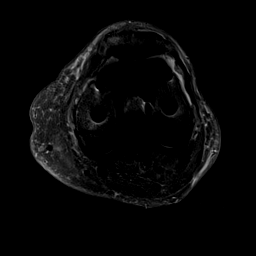
[im 28/28]
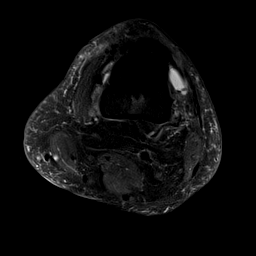

[Series 14: PD · axial · left · 3.5mm · 0.47mm/px · z∈[-124,+28]mm · 7 of 36 slices shown (1 of 3)]
[im 1/36]
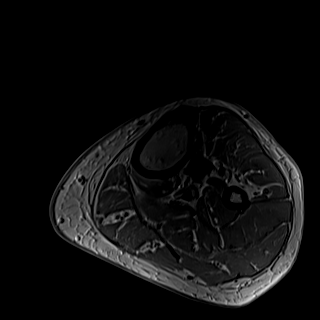
[im 6/36]
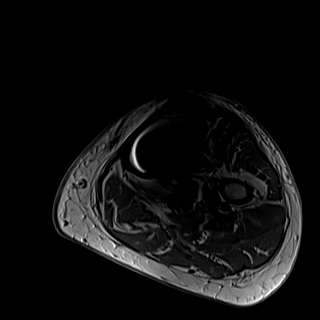
[im 12/36]
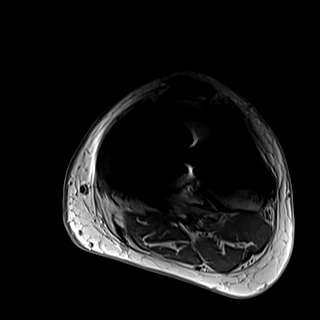
[im 18/36]
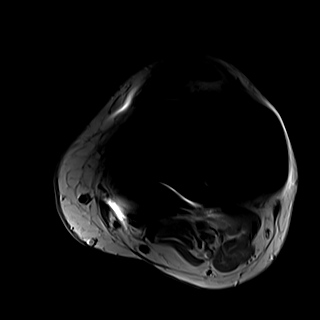
[im 24/36]
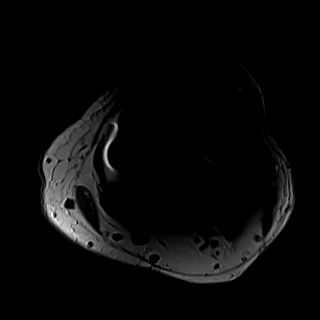
[im 30/36]
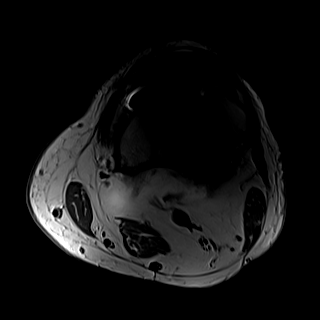
[im 36/36]
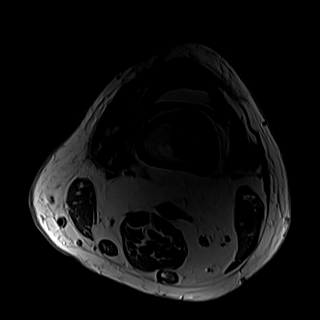

[Series 15: STIR · axial · left · 4.5mm · 0.62mm/px · z∈[-124,+28]mm · 5 of 28 slices shown (2 of 3)]
[im 1/28]
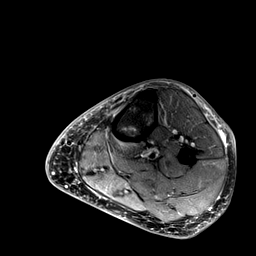
[im 7/28]
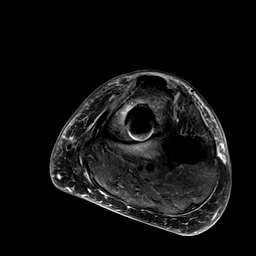
[im 14/28]
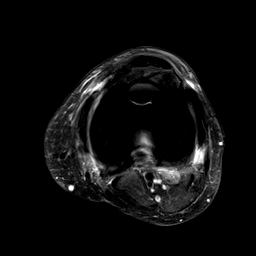
[im 21/28]
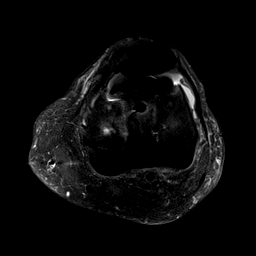
[im 28/28]
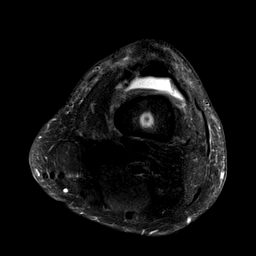

[Series 16: PD · coronal · left · 3.0mm · 0.47mm/px · 6 of 32 slices shown (2 of 3)]
[im 1/32]
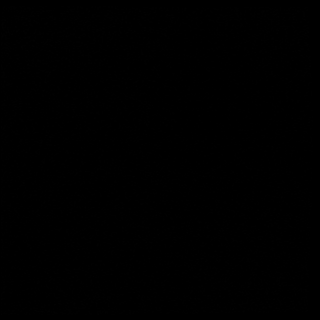
[im 7/32]
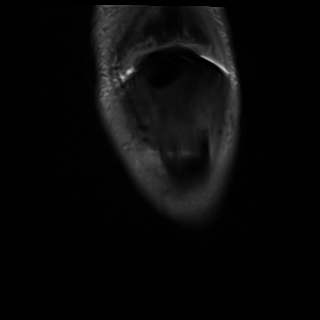
[im 13/32]
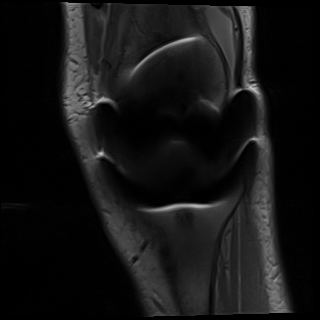
[im 19/32]
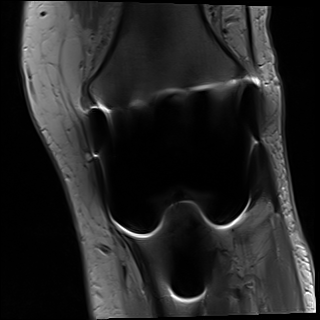
[im 25/32]
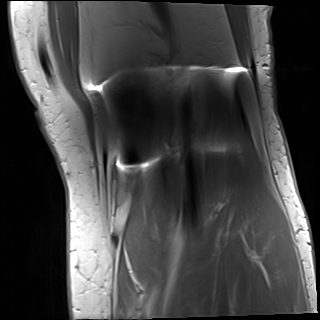
[im 32/32]
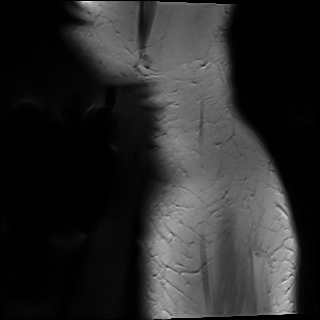

[Series 17: T1 · coronal · left · 3.0mm · 0.59mm/px · 6 of 31 slices shown]
[im 1/31]
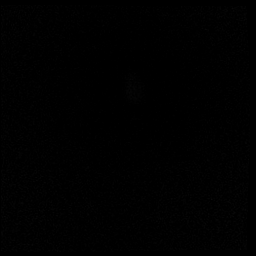
[im 7/31]
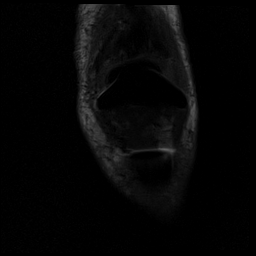
[im 13/31]
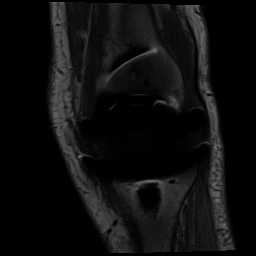
[im 19/31]
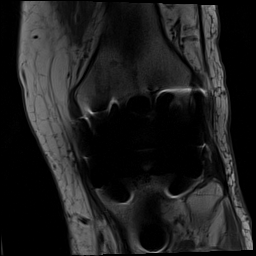
[im 25/31]
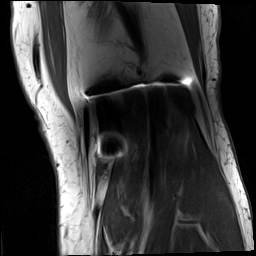
[im 31/31]
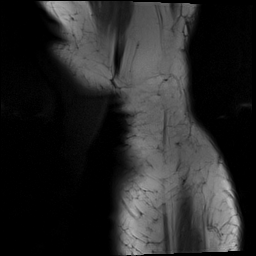

[Series 18: STIR · coronal · left · 3.0mm · 0.59mm/px · 6 of 32 slices shown (3 of 3)]
[im 1/32]
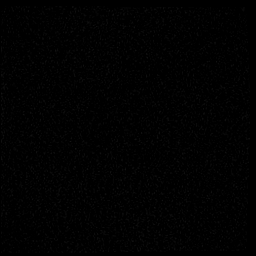
[im 7/32]
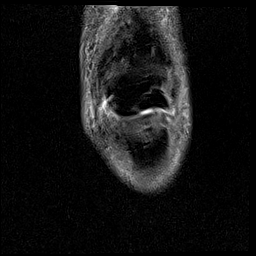
[im 13/32]
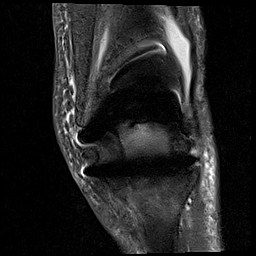
[im 19/32]
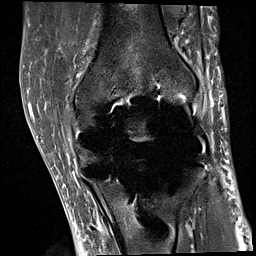
[im 25/32]
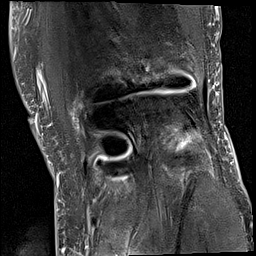
[im 32/32]
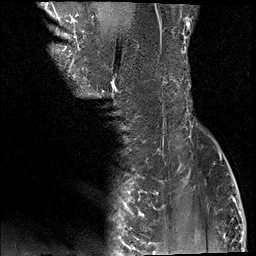

[Series 19: PD · sagittal · left · 4.0mm · 0.47mm/px · 4 of 22 slices shown (3 of 3)]
[im 1/22]
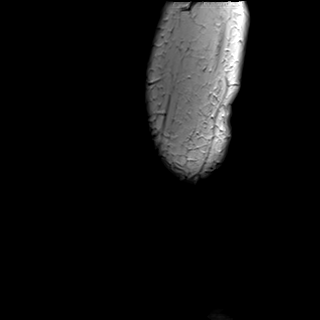
[im 8/22]
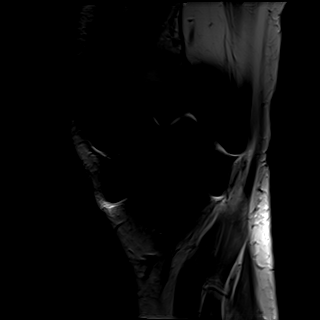
[im 15/22]
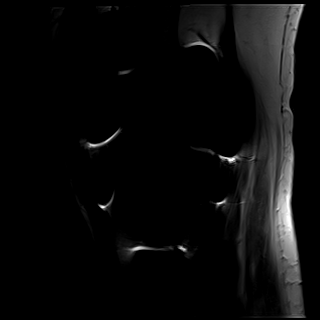
[im 22/22]
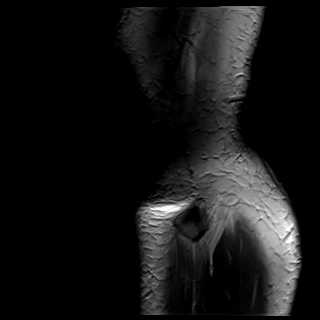

[40 of 40 positions shown; findings below may reference images not displayed]

FINDINGS: Bones/Joint/Cartilage

There is a total knee arthroplasty in place with associated
susceptibility artifact, distorting the immediately adjacent bone
and soft tissues. There is no acute osseous abnormality. Expected
marrow signal alteration in medially adjacent to the arthroplasty.

Ligaments

Distorted by susceptibility artifact but collateral ligaments appear
intact.

Muscles and Tendons
No intramuscular collection. No acute myotendinous abnormality
identified.

Soft tissue
Mild soft tissue swelling anteriorly.  No focal fluid collection.
IMPRESSION: Left total knee arthroplasty with associated susceptibility
artifact. Small joint effusion.

Mild superficial soft tissue swelling anteriorly. No focal fluid
collection.

No acute osseous abnormality.

## 2022-02-04 NOTE — Telephone Encounter (Signed)
Referral to Rheum

## 2022-02-05 ENCOUNTER — Telehealth: Payer: Self-pay

## 2022-02-05 NOTE — Telephone Encounter (Signed)
-----   Message from Laurice Record, MD sent at 02/04/2022  5:03 PM EDT ----- Blood Cx with no growth. 1/2 state suboptimal specimen due to low volume of blood collected per [facility]. No need to redraw. Per positive ANA will place rheumatology referral

## 2022-02-10 DIAGNOSIS — I119 Hypertensive heart disease without heart failure: Secondary | ICD-10-CM | POA: Diagnosis not present

## 2022-02-10 DIAGNOSIS — R768 Other specified abnormal immunological findings in serum: Secondary | ICD-10-CM | POA: Diagnosis not present

## 2022-02-10 DIAGNOSIS — M25562 Pain in left knee: Secondary | ICD-10-CM | POA: Diagnosis not present

## 2022-02-10 DIAGNOSIS — N1832 Chronic kidney disease, stage 3b: Secondary | ICD-10-CM | POA: Diagnosis not present

## 2022-02-13 ENCOUNTER — Encounter: Payer: Self-pay | Admitting: Internal Medicine

## 2022-02-18 ENCOUNTER — Telehealth: Payer: Self-pay | Admitting: Adult Health

## 2022-02-18 NOTE — Telephone Encounter (Signed)
Scheduled appointment per provider request off. Left voicemail for patient.

## 2022-02-25 ENCOUNTER — Encounter: Payer: Self-pay | Admitting: Neurology

## 2022-02-25 ENCOUNTER — Ambulatory Visit (INDEPENDENT_AMBULATORY_CARE_PROVIDER_SITE_OTHER): Payer: Medicare Other | Admitting: Neurology

## 2022-02-25 VITALS — BP 133/82 | HR 93 | Ht 62.0 in | Wt 139.5 lb

## 2022-02-25 DIAGNOSIS — I2510 Atherosclerotic heart disease of native coronary artery without angina pectoris: Secondary | ICD-10-CM | POA: Diagnosis not present

## 2022-02-25 DIAGNOSIS — M25562 Pain in left knee: Secondary | ICD-10-CM | POA: Diagnosis not present

## 2022-02-25 DIAGNOSIS — R5082 Postprocedural fever: Secondary | ICD-10-CM | POA: Diagnosis not present

## 2022-02-25 DIAGNOSIS — M7918 Myalgia, other site: Secondary | ICD-10-CM

## 2022-02-25 NOTE — Progress Notes (Signed)
SLEEP MEDICINE CLINIC   Provider:  Larey Seat, M D  Referring Provider: [name], MD Primary Care Physician:  [name], MD  Chief Complaint  Patient presents with   Follow-up    RM 10, alone. Followup for cpap. Here for 6  month CPAP f/u. Pt reports doing well on CPAP. Had L knee replacement in April 18th. . She states she has been using machine nightly.    Interval history : 02-25-2022: patient in pain, swelling in the surgical knee, and positive ANA. Nuclear ANA pattern. Systemic Sclerosis/ polymyositis is in the differential diagnosis.  She has been using her PAP nightly . Residual AHI 4.1/h - satisfying result.  [employer] [facility]- 97% compliance.  The sleep part is doing good, the knee and pain is being a problem.     06-12-2021: Central apnea patient. Can't stay in high altitude without supplemental oxygen for 24/ 7 now.  Brain MRI- Dr Ardeth Perfect,  mild orthostatic dizziness. Vertigo is much improved, mainly through [place] maneuvers.  Ataxia?  Balance remains poor. She may walk and suddenly veer off, goes 'off piste'. I like for her to use a cane.  We repeated today a brief gait examination Tracey [name] gait up and down the hallway was very steady.   She feels improved by a CBC ointment.   Endurance is poorer since she contracted Covid July 4th. She felt very myalgic and fatigued.  Slowly improving. Today is a good day.   The patient had undergone a split-night polysomnography in May 2022 and I felt that she is still at severe and complex apnea she was treated with CPAP and [place].  She did have PVCs and her single heart electrode, she will continue using CPAP autotitration 5 through 10 cmH2O 1 cm EPR and she uses a nasal mask and small size.      IMPRESSION: 03-24-2021:  1. No acute intracranial abnormality. 2. Periventricular and subcortical T2 hyperintensities bilaterally are moderately advanced for age. The finding is  nonspecific but can be seen in the setting of chronic microvascular ischemia, a demyelinating process such as multiple sclerosis, vasculitis, complicated migraine headaches, or as the sequelae of a prior infectious or inflammatory process.    Rv , post seep study from 01-09-2021,POLYSOMNOGRAPHY IMPRESSION :    1.        SEVERE and Complex Sleep Apnea (CSA) was treated with CPAP in this CPAP dependent patient, who [place].  2.        Fragmented sleep, many spontaneous arousals from sleep 3.        Many PVCs in single channel EKG.  4.        NO REM sleep was recorded.  5.        No additional  oxygen was needed for sleep at this altitude.    RECOMMENDATIONS: Continue CPAP use with autotitration device at a range from 5 cm water  through 10 cm water , 1 cm EPR, under heated humidification and nasal N 20 mask in small size.           Interval history 12-09-2020, needing a new machine , now [place] oxygen [place]. She has bruxism, and is damaging her teeth, effexor was exchanged to zoloft to prevent some of this. She needs a mouth guard now in daytime- stress symptom? More balance problems- not affecting her golf game. Mostly unsteady on stairs,  Feels she  fall s when looking down. Tracey [name] is a 76 y.o. female , seen here in a RV presents today for follow up..  Discussing she is eligible for a new machine 12/2020.  Since the patient has a history of complex sleep apnea and her current AHI is 5.4 without major air leakage and was a device pressure of 90% of the time of 13 cmH2O I would very much like for her to continue using this machine in spite of being on recall.  I ordering a home sleep test to confirm that her current settings are fine with a minimum pressure of 5 maximum pressure of 13 cmH2O to centimeter EPR heated humidity starting pressure is 4 cmH2O ramp time is only 5 minutes.  I reviewed the medication list, the Epworth Sleepiness Scale  was endorsed at 5 points while on treatment.  She is not sleeping at night without it and when she spends time at her [facility] home she is usually on oxygen at night but for the first time this winter she was asked to use it when she exercises or even walks in daytime she became so severely short of breath.     06-03-2020:  Tracey [name] is a 76 y.o. female , seen here in a RV  rm 11 presents today for follow up. states overall things are well. she asked about a new machine. in discussing she is eligible for a new machine 12/2020. however currently her machine is a respironics and may be a part of a recall as she does use a ozone cleaning device. RN informed her about registrering her device. DME [employer].  I have the pleasure of seeing Tracey [name] today, a longstanding and established patient who has used positive airway pressure therapy for the treatment of complex sleep apnea.  Her sleep apnea complexity is related to underlying causes which we have listed in her past medical history.  She she would be due for new machine as of May 2022 her current C-Flex CPAP is set with a pressure window, does not produce a lot of air leaks, and she is using it 63.3% of the time compliantly.  Actually she has used the machine 26 out of 30 days.  Some nights with use at time is shortened.  She sometimes uses the connection to the machine at night.  Her average AHI is 6.5 which has been a little higher than last year.  She is using a humidifier, she has a ramp time of 5 minutes and starts at 4 cmH2O with a maximum pressure option of 12 cm of water.  Her 95th percentile pressure used is at 12 cmH2O so she is maxing out on the upper limits.  I would rather until May increase her current pressure by 1 cm and will order a repeat sleep study which can be a home sleep test for her on or about April so that we are ready with a new baseline.   I like to have her tested before she needs the new machine.      Originally as a referral from Dr. Ardeth Perfect -  She fell out of bed, had to get stitches in the ED- she has more and more dream enactment, often nightmares, and not leaving the bed. She had vivd dreams since childhood, and her grandson has night terrors. She also hears music when there is none, but only when a mechanical noise is in the background.  She never had tinnitus, but a  sound as if a radio produces  static sounds was perceived.  On December10th 2019 she had a knee surgery, and the pain medications was giving her horrible delusions- she was terrified. Complex visual hallucinations - these were visions of people, snakes, no voices. People that looked into the window. Delirium.  She called her MD and the police, and her husband was helpfess.   She is OK with her apnea treatment. This year has been quiet , no travels due to Covid 19, and she enjoyed being home - sad only because she missed a family re-union.  Her son died in 06/02/23- of substance abuse.   rthostatics : laying down her blood pressure was 122/67 with a heart rate of 56,  seated blood pressure was 112% over 73 mmHg with a heart rate of 61 this was only arise by 5 beats per minutes is a little less than I would expect .  her standing blood pressure was 120/65 mmHg and her heart rate rose only to another 3 bpm to 64 bpm.    After 3 minutes of standing blood pressure was stabilized at 125/67 with a heart rate of 63.    So I do think there is a quite significant delay in her heart rate and blood pressure adjusting to changes in posture still but that does not explain that she would have sudden bouts of balance problems.  She seems to fall to one side or feel weaker on one side.  I also do not see that correlated to her MRI necessarily.   Tracey [name] provided me with a high compliance record 93.3% for the last 30 days prior to 29 May 2019.  Average use a time for all days 7 hours 55 minutes, mean pressure of CPAP 9.1  average device pressure 11.4 average time in large leak 17 minutes average AHI 6.4.  As it is a 6.4 is related to the leak time however we do not need to change settings.   First visit upon referral with Dr Ardeth Perfect. Chief complaint according to patient : " re evaluate my apnea and care." The patient reports that she moved to the [place] area about 3 years ago and still gets supplies for her established CPAP per Mail. She was diagnosed with obstructive sleep apnea in [place], in [place]. [facility] posted the sleep center. The patient was diagnosed on 05/02/2008 the study revealed an AHI of 23 was mild decreases in oxygen level as well as continuous snoring. Then CPAP was used for the second night of the split night polysomnography and her oxygen level improved to normal her snoring resolved and the breathing was totally regulated according to Dr. Shelton Silvas note. Concerning was that there was a ventricular tachycardia as of 27 beats. The patient was evaluated by cardiology afterwards the finding could never be reciprocated. She continued to use CPAP for the last 7 years compliantly but she is a little tired of using a nasal mask that has become uncomfortable and his pressure marks on her face. She is also not sure to what degree apnea still present at this time. The patient stays about 7 months of the year here in [place], and 5 months in [place]. Usually they travel from Thanksgiving to Easter. Her husband witnesses snoring when she naps, but not while on CPAP. Her insomnia improved drastically after CPAP.   Sleep habits are as follows: The patient goes to bed between 9.30 and 10 PM, usually falls asleep promptly. She prefers  the left side to sleep , but wakes sometimes up on her back.  She learned to sleep on her back after breast cancer surgery ( diagnosed Aug 8th 2014 ). The bedroom is described as core, quiet and dark. The patient shares a bedroom with her husband.  They're also 2 dogs in the bedroom on the bed. She rises usually once to go to the bathroom sometimes twice. She can fall asleep again fairly promptly. She is not a restless sleeper not kicking or moving or not excessively at night. She rises in the morning at 5 sometimes even 8. She usually wakes spontaneously not based on an alarm setting. She averages 7-8 hours of nocturnal sleep.  She reports vivid dreams, "repetitive and in full color".  Some mornings she feels refreshed and restored but not all the time. She is still recovering from her breast reconstruction surgery. She endorsed some snoring, joint pain, aching muscles and even some dizziness. She also has right shoulder bursitis which limits her ability to sleep on the right. She naps 3 out of 7 days, once  daily, a nap will last  2 hours unless she sets an alarm.   Tracey [name] this past medical history includes gout, hyperlipidemia, hypertension, palpitations probably due to nonsustained ventricular tachycardia. In addition breast cancer of which her mother maternal aunt maternal grandmother and maternal great-grandmother were also affected. She is a history of allergic rhinitis her sister died in 2011/10/13 of ALS. Sleep medical history and family sleep history: Her father was diagnosed with OSA never used CPAP ,  had an MI at age 40. Social history: non smoker, quit at age 79. ETOH , 1-2 at night. Caffeine use : none. Decaffeinated tea and coffee. Average and regular golf player. She likes winter sports, especially skiing but uses the left dangerous slope these days.  Interval history from 05-20-15. Hearing has been willing to undergo a new sleep study and attended nocturnal polysomnography on 04-09-15. She was diagnosed with a very mild AHI of 5.2 , supine sleep  AHI became 12.4  and it was further evident that the patient did not get into REM sleep. For this reason I feel it is much more important that the patient stays on CPAP given that her apnea was  mild and she was fitted with a new mask. The new mass does not cause pressure marks around her nose and does not entangle her hair. She is using a dream wear interface, which [employer] had to explicitly order for her.  Interval history from 12/23/2015. Tracey [name] has happily returned from [place], I have the pleasure today to see her CPAP download which confirms and 96.7% compliance average user time 6 hours and 39 minutes of CPAP.The device is a C flex set at 8 cm water pressure with a ramp time of 30 minutes. The REM time is a little too long and we will reduce it to 10 minutes. Her husband has made her aware that she still snores when she doesn't use a CPAP. She has become more compliant she endorsed only 2 points today on the geriatric depression store, 7 points on the Epworth sleepiness score and the fatigue severity at 29.  Interval history from 02/26/2016. Tracey [name] is here today relaxed after a week vacation. She reports sleeping so much better with the new CPAP machine. Her sleep is deeper but she does not recall having nightmares she has fallen out of bed 1 night it was actually the third night she used  the new CPAP. She feels her sleep is less fragmented, more sound, and more refreshing and restorative. Her download revealed 100% compliance over the last 30 days was 97% compliance 4 hours of use, average user time is 7 hours and 41 minutes. This is excellent compliance with CPAP is set at 8 cm water we did  use a 10 minute ramp function. Her snoring has of course been alleviated using CPAP, which pleases her husband. She has a residual AHI of 8.1 but given her decrease and sleepiness/ fatigue I would not want to change the settings.  Interval history from 12/21/2016, Tracey [name] has been to winter in [place] and had a additional medication in [place]. She is now doing well but during her winter stay in [place] she developed shortness of breath and apparently more severe hypoxemia and apnea. She  saw a pulmonologist at the location would tested her oxygen levels while walking and also diagnosed her with type a flu. She had been coughing for over 6 weeks when the flu finally converted to bronchitis and then resolved. She remained air hungry and short of breath for much of her winter time. I'm able to state today that she feels back to her baseline. But we need to discuss how we could prevent altitude related apneas which can often be central in nature and if she should use an AutoPap with an automatic pressure window. The patient is a very highly compliant CPAP user her compliance rate was 93.3% with an average user time of 7 hours and 6 minutes. She has some air leaks but not significant once CPAP is set at 8 cm water pressure with a residual AHI of 17.4. She has a short ramp time. Most of the AHI is related to shallow breathing. Her average central apnea index is 1.1 and obstructive apnea index is 3.3. Our goal is is to have an AHI of below 5. I will ask [employer] to change her to an auto titration from 5-10 cm water. She continues to use a dream wear interface. She would like the non-prong version of the dream wear interface. We will also meet again in late September or October to discuss if she needs additional oxygen at night during her stays at high altitudes.  05-24-2017, Tracey [name] is planning again to spend the winter in [place]. She asked me to make sure that she will have oxygen available for nightly use in the upper inner air. Last year she stated she was miserable on CPAP alone. She is a very compliant CPAP use of his 100% compliance and an average user time of 9 hours and 2 minutes, uses the machine as an auto CPAP between 5 and 15 cm water, average pressure is 9.9 cm water and the average residual AHI is 5.1. The machine allows between 5 and 10 cm water pressure only, given that the patient does not have central apneas I will increase the upper limits to 12 cm water. I would  also investigate is able care can help [place] with oxygen in [place] ( she knows an oxygen supplier in [place]- "[employer]" ) .   05-30-2018, The patient suffered a heart attack in January of this year, and had a rough year- needs bilateral knee replacements. Her step- son died 56 days ago at age 73 years , with alcoholism and bipolar disease, died of multi organ -liver failure. She brought her CPAP machine - and would like to change her interface .she wants to return to  a pillairo or nasal pillow. Her machine was issued in  August 2016- after a AHI of only 5.2- but loud snoring and RERAS. UARS. Residual AHI was 7/h at 90% pressure of 10.2 cm water.  Compliance was 87% for time, and 26/ 30 days.    Review of Systems: Out of a complete 14 system review, the patient complains of only the following symptoms, and all other reviewed systems are negative.Snoring. Grieving - lost mother and sister to breast cancer in a short time and was diagnosed with breast cancer herself 3.5 years ago.  On antihormone therapy. She takes statins.  Epworth score 7/24  , Fatigue severity score again at  41/ 63, geriatric depression score 2 ,     her machine is 102.77  years old .  AHI increased . Nasal pillow- dream wear causes hair trouble- will switch back to a nasal pillow.  Settings are AUTO on a dream station, settings are at 5-12 with 2 cm water EPR. I increased to 13 cm.   How likely are you to doze in the following situations: 0 = not likely, 1 = slight chance, 2 = moderate chance, 3 = high chance  Sitting and Reading? Watching Television? Sitting inactive in a public place (theater or meeting)? Lying down in the afternoon when circumstances permit? Sitting and talking to someone? Sitting quietly after lunch without alcohol? In a car, while stopped for a few minutes in traffic? As a passenger in a car for an hour without a break?  Total = 5 on auto PAP, again 02-25-2022. Marland Kitchen   Bruxism  Shingles in  2021 winter, left neck and shoulder.     Social History   Socioeconomic History   Marital status: Married    Spouse name: Not on file   Number of children: 2   Years of education: Not on file   Highest education level: Not on file  Occupational History   Not on file  Tobacco Use   Smoking status: Former    Packs/day: 1.00    Years: 20.00    Total pack years: 20.00    Types: Cigarettes    Start date: 06/09/1992   Smokeless tobacco: Never   Tobacco comments:    quit at age 62  Vaping Use   Vaping Use: Never used  Substance and Sexual Activity   Alcohol use: Yes    Alcohol/week: 7.0 - 14.0 standard drinks of alcohol    Types: 7 - 14 Glasses of wine per week    Comment: daily   Drug use: Yes    Types: Other-see comments    Comment: quit 24 years ago.   CBD oil topical.   Sexual activity: Yes    Birth control/protection: Surgical  Other Topics Concern   Not on file  Social History Narrative   Not on file   Social Determinants of Health   Financial Resource Strain: Not on file  Food Insecurity: Not on file  Transportation Needs: Not on file  Physical Activity: Not on file  Stress: Not on file  Social Connections: Not on file  Intimate Partner Violence: Not on file    Family History  Problem Relation Age of Onset   Breast cancer Mother        possible inflammatory breast cancer   Heart attack Father    Heart disease Father    ALS Sister    Breast cancer Maternal Grandmother 49   Heart attack Maternal Grandfather    Heart attack  Paternal Grandfather    Breast cancer Maternal Aunt        dx in her 75s   Breast cancer Other        maternal great grandmother; dx in her 56s    Past Medical History:  Diagnosis Date   Allergy    Anxiety    takes Ativan daily prn   Arthritis    Breast cancer ([place]) 2014   ER+/PR+/HEr2-,    Bruises easily    Colon polyps    2 polyps by report   Coronary artery disease involving native coronary artery of native heart  without angina pectoris 01/01/2014   NSTEMI in January 2019 in [place]>> s/p DES x2 to the LAD (procedure complicated by stent thrombosis) Myoview 05/2020: EF 72, no infarct or ischemia; low risk Echocardiogram 05/2019: EF 60-65, GLS -21.5 Cardiac catheterization at [facility] in 08/2017: Normal left main, normal LCx, normal RCA; LAD/diagonal 95%>> PCI    Diverticulosis    GERD (gastroesophageal reflux disease)    occasionally takes Nexium    Gout    takes Allopurinol daily and Colchicine daily prn;last attack 38yr ago   Gout    History of bladder infections    many yrs ago   History of bronchitis    last time at least [id number]   History of stress incontinence    Hyperlipidemia    takes Pravastatin daily   Hypertension    Insomnia    takes Ambien nightly prn   [facility]STEM[facility] (non-ST elevated myocardial infarction) [place]   OSA (obstructive sleep apnea)    on CPAP   Osteoarthritis    Peripheral edema    takes Furosemide daily prn   PONV (postoperative nausea and vomiting)    Postmenopausal hormone therapy    Radiation 07/27/13-09/07/13   Right Breast x 31 treatments   Rhinitis    uses Flonase prn   Sinus congestion    Status post breast reconstruction 10/15/2014   Bilateral implant removal and DIEP performed in [place], [place]   Tachycardia    takes Metoprolol daily   Ventricular tachycardia, non-sustained ([facility]   during sleep study 2009 with normal cardiac workup    Past Surgical History:  Procedure Laterality Date   ABDOMINAL HYSTERECTOMY     with BSO   APPENDECTOMY     AXILLARY SENTINEL NODE BIOPSY Right 05/23/2013   Procedure: AXILLARY SENTINEL NODE BIOPSY;  Surgeon: ToOdis HollingsheadMD;  [facility];  Laterality: Right;  nuc med injection 7:00   BREAST RECONSTRUCTION WITH PLACEMENT OF TISSUE EXPANDER AND FLEX HD (ACELLULAR HYDRATED DERMIS) Bilateral 05/23/2013   Procedure: BILATERAL BREAST RECONSTRUCTION WITH PLACEMENT OF TISSUE  EXPANDER AND FLEX HD;  Surgeon: DaCrissie ReeseMD;  [facility];  Laterality: Bilateral;   CARDIAC CATHETERIZATION  1999   CATARACT EXTRACTION, BILATERAL Bilateral    CHOLECYSTECTOMY     COSMETIC SURGERY     OTHER SURGICAL HISTORY  09/2017   had cyst removal from spine    PERCUTANEOUS CORONARY STENT INTERVENTION (PCI-S)  08/2017   [place] Bilateral 05/17/2014   Procedure: REMOVAL OF BILATERAL TISSUE EXPANDERS WITH PLACEMENT OF BILATERAL BREAST IMPLANTS FOR RECONSTRUCTION;  Surgeon: DaCrissie ReeseMD;  [facility]: [facility];  Laterality: Bilateral;   TONSILLECTOMY     TOTAL KNEE ARTHROPLASTY Right 07/26/2018   Procedure:  TOTAL KNEE ARTHROPLASTY;  Surgeon: Paralee Cancel, MD;  Location: [facility];  Service: Orthopedics;  Laterality: Right;  70 mins   TOTAL KNEE ARTHROPLASTY Left 12/02/2021   Procedure: TOTAL KNEE ARTHROPLASTY;  Surgeon: Paralee Cancel, MD;  Location: [facility];  Service: Orthopedics;  Laterality: Left;   TOTAL MASTECTOMY Bilateral 05/23/2013   Procedure: TOTAL MASTECTOMY;  Surgeon: [name], MD;  [place]: [place];  Service: General;  Laterality: Bilateral;    Current Outpatient Medications  Medication Sig Dispense Refill   allopurinol (ZYLOPRIM) 100 MG tablet TAKE 1 TABLET BY MOUTH ONCE DAILY (Patient taking differently: Take 100 mg by mouth daily.) 90 tablet 0   ASPIRIN LOW DOSE 81 MG chewable tablet Chew 81 mg by mouth daily.   0   Cholecalciferol (VITAMIN D3) 50 MCG (2000 UT) TABS Take 2,000 Units by mouth daily.     clopidogrel (PLAVIX) 75 MG tablet Take 1 tablet (75 mg total) by mouth daily. 90 tablet 3   cyanocobalamin (,VITAMIN B-12,) 1000 MCG/ML injection Inject 1,000 mcg into the muscle every 30 (thirty) days.     Doxylamine-DM & DM (VICKS DAYQUIL/NYQUIL COUGH) 6.25-15 & 15 MG/15ML LQPK Take 15 mLs by mouth every 6 (six) hours as needed  (allergy symptoms).     ezetimibe (ZETIA) 10 MG tablet TAKE 1 TABLET(10 MG) BY MOUTH DAILY (Patient taking differently: Take 10 mg by mouth daily.) 90 tablet 2   fluticasone (FLONASE) 50 MCG/ACT nasal spray Place 1 spray into both nostrils daily.     furosemide (LASIX) 40 MG tablet Take 40 mg by mouth daily as needed for fluid.      loratadine (CLARITIN) 10 MG tablet Take 10 mg by mouth daily.     methocarbamol (ROBAXIN) 500 MG tablet Take 1 tablet (500 mg total) by mouth every 8 (eight) hours as needed for muscle spasms. 10 tablet 0   metoprolol succinate (TOPROL-XL) 25 MG 24 hr tablet TAKE 1 TABLET(25 MG) BY MOUTH DAILY (Patient taking differently: Take 25 mg by mouth daily.) 90 tablet 3   Multiple Vitamin (MULTI-VITAMIN) tablet Take 1 tablet by mouth daily.     nitroGLYCERIN (NITROSTAT) 0.4 MG SL tablet Place 1 tablet (0.4 mg total) under the tongue every 5 (five) minutes as needed for chest pain. 25 tablet 3   NONFORMULARY OR COMPOUNDED ITEM Vitamin E vaginal cream 200u/ml.  One ml pv three times weekly. (Patient not taking: Reported on 01/28/2022) 36 each 1   pantoprazole (PROTONIX) 40 MG tablet TAKE 1 TABLET(40 MG) BY MOUTH DAILY (Patient taking differently: Take 40 mg by mouth daily.) 90 tablet 3   polyethylene glycol (MIRALAX / GLYCOLAX) 17 g packet Take 17 g by mouth daily as needed for mild constipation. 14 each 0   rosuvastatin (CRESTOR) 20 MG tablet Take 1 tablet (20 mg total) by mouth daily. 90 tablet 3   sertraline (ZOLOFT) 100 MG tablet Take 100 mg by mouth daily.     testosterone cypionate (DEPOTESTOSTERONE CYPIONATE) 200 MG/ML injection Inject 200 mg into the muscle See admin instructions. Inject 261m into the muscle every 45 days     No current facility-administered medications for this visit.    Allergies as of 02/25/2022 - Review Complete 02/25/2022  Allergen Reaction Noted   Pseudoeph-hydrocodone-gg Other (See Comments) 02/03/2019   Ivp dye [iodinated contrast media] Hives  06/09/2012   Oxycodone Other (See Comments) 12/30/2021   Sulfa antibiotics Swelling 06/09/2012   Tape Other (See Comments) 07/04/2018    Vitals: BP 133/82  Pulse 93   Ht 5' 2"  (1.575 m)   Wt 139 lb 8 oz (63.3 kg)   LMP 08/17/1977 (Approximate)   BMI 25.51 kg/m  Last Weight:  Wt Readings from Last 1 Encounters:  02/25/22 139 lb 8 oz (63.3 kg)   LOV:FIEP mass index is 25.51 kg/m.     Last Height:   Ht Readings from Last 1 Encounters:  02/25/22 5' 2"  (1.575 m)   Ankle edema- left, since knee surgery.  Physical exam: hoarse voice. Looking well and well groomed.  No loss of smell or taste- never had covid 19, had 2 vaccines and a booster.  General: The patient is awake, alert and appears not in acute distress.  The patient is well groomed. She reports sweating a lot.  Head: Normocephalic, atraumatic. Neck is supple. 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 Jaw clencher.  Retrognathia is not seen.  She has a crowded lower jaw and she has bruxism marks. Wore a dental device. . Cardiovascular:  Regular rate and rhythm , without  murmurs or carotid bruit, and without distended neck veins. Respiratory: Lungs are clear to auscultation. Skin:  Trunk: BMI is 25.1. The patient's posture is erect.   Neurologic exam :Speech is fluent,  without dysarthria, mild dysphonia but no aphasia.  Mood and affect are appropriate.  Cranial nerves:Pupils are equal and briskly reactive to light.  Extraocular movement are intact. Hearing to finger rub intact.  Facial sensation intact to fine touch. Facial motor strength is symmetric and tongue and uvula moves midline.  Shoulder shrug was symmetrical.  Muscle tone is smooth, but I feel no cogwheeling, there is  bilaterally mild tremor, no ataxia.  Normal finger to nose.Gait was stabile today.   Assessment and Plan:    I would certainly want her to have oxygen whenever at high altitude she did test  negative for oxygen needs here at this level.  She is dancing again, but she reports dizziness. 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  Auto CPAP, 5-10 cm water.  She was highly compliant 100% for time and date of download obtained on 8-15 2022.  Her average pressure for CPAP is 9.9 cmH2O and she has not many air leaks.  Her residual apnea hypopnea index 7.0 the minimum pressure was 5 the maximum pressure of 10 cmH2O was 2 cm flex setting I think with mostly concerns for is that her machine has a 50-minute ramp and starts at 4 cmH2O I would like to take the ramp off.  I will also allow for a 12 cm maximum pressure setting.  2) pain - postoperatively - she feels this knee heals so slowly - but she has good ROM.   3) unsteady gait- we checked today gait speed and turns.  Good walking speed, turned with 3 steps today, no ataxia. Joint problem rather than neurological cause.   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 No tremor -Now some mild cogwheeling, may need balance and gait PT> had white matter disease on MRI. 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 Knee-Pain at night.   I spent more than 30 minutes of face to face time with the patient. Greater than 50% of time was spent in counseling and coordination of  care. We have discussed the diagnosis and differential and I answered the patient's questions.   6 month RV  again with gait examination from now on.    02-25-2022:   Larey Seat MD  02/25/2022   CC: [name], [place] [place],  [place] [id number]

## 2022-02-25 NOTE — Patient Instructions (Signed)
Myofascial Pain Syndrome/ Fibromyalgia Myofascial pain syndrome and fibromyalgia are both pain disorders. You may feel this pain mainly in your muscles. Myofascial pain syndrome: Always has tender points in the muscles that will cause pain when pressed (trigger points). The pain may come and go. Usually affects your neck, upper back, and shoulder areas. The pain often moves into your arms and hands. Fibromyalgia: Has muscle pains and tenderness that come and go. Is often associated with tiredness (fatigue) and sleep problems. Has trigger points. Tends to be long-lasting (chronic), but is not life-threatening. Fibromyalgia and myofascial pain syndrome are not the same. However, they often occur together. If you have both conditions, each can make the other worse. Both are common and can cause enough pain and fatigue to make day-to-day activities difficult. Both can be hard to diagnose because their symptoms are common in many other conditions. What are the causes? The exact causes of these conditions are not known. What increases the risk? You are more likely to develop either of these conditions if: You have a family history of the condition. You are female. You have certain triggers, such as: Spine disorders. An injury (trauma) or other physical stressors. Being under a lot of stress. Medical conditions such as osteoarthritis, rheumatoid arthritis, or lupus. What are the signs or symptoms? Fibromyalgia The main symptom of fibromyalgia is widespread pain and tenderness in your muscles. Pain is sometimes described as stabbing, shooting, or burning. You may also have: Tingling or numbness. Sleep problems and fatigue. Problems with attention and concentration (fibro fog). Other symptoms may include: Bowel and bladder problems. Headaches. Vision problems. Sensitivity to odors and noises. Depression or mood changes. Painful menstrual periods (dysmenorrhea). Dry skin or eyes. These  symptoms can vary over time. Myofascial pain syndrome Symptoms of myofascial pain syndrome include: Tight, ropy bands of muscle. Uncomfortable sensations in muscle areas. These may include aching, cramping, burning, numbness, tingling, and weakness. Difficulty moving certain parts of the body freely (poor range of motion). How is this diagnosed? This condition may be diagnosed by your symptoms and medical history. You will also have a physical exam. In general: Fibromyalgia is diagnosed if you have pain, fatigue, and other symptoms for more than 3 months, and symptoms cannot be explained by another condition. Myofascial pain syndrome is diagnosed if you have trigger points in your muscles, and those trigger points are tender and cause pain elsewhere in your body (referred pain). How is this treated? Treatment for these conditions depends on the type that you have. For fibromyalgia a healthy lifestyle is the most important treatment including aerobic and strength exercises. Different types of medicines are used to help treat pain and include: NSAIDs. Medicines for treating depression. Medicines that help control seizures. Medicines that relax the muscles. Treatment for myofascial pain syndrome includes: Pain medicines, such as NSAIDs. Cooling and stretching of muscles. Massage therapy with myofascial release technique. Trigger point injections. Treating these conditions often requires a team of health care providers. These may include: Your primary care provider. A physical therapist. Complementary health care providers, such as massage therapists or acupuncturists. A psychiatrist for cognitive behavioral therapy. Follow these instructions at home: Medicines Take over-the-counter and prescription medicines only as told by your health care provider. Ask your health care provider if the medicine prescribed to you: Requires you to avoid driving or using machinery. Can cause constipation.  You may need to take these actions to prevent or treat constipation: Drink enough fluid to keep your urine pale yellow.  Take over-the-counter or prescription medicines. Eat foods that are high in fiber, such as beans, whole grains, and fresh fruits and vegetables. Limit foods that are high in fat and processed sugars, such as fried or sweet foods. Lifestyle  Do exercises as told by your health care provider or physical therapist. Practice relaxation techniques to control your stress. You may want to try: Biofeedback. Visual imagery. Hypnosis. Muscle relaxation. Yoga. Meditation. Maintain a healthy lifestyle. This includes eating a healthy diet and getting enough sleep. Do not use any products that contain nicotine or tobacco. These products include cigarettes, chewing tobacco, and vaping devices, such as e-cigarettes. If you need help quitting, ask your health care provider. General instructions Talk to your health care provider about complementary treatments, such as acupuncture or massage. Do not do activities that stress or strain your muscles. This includes repetitive motions and heavy lifting. Keep all follow-up visits. This is important. Where to find support Consider joining a support group with others who are diagnosed with this condition. National Fibromyalgia Association: www.fmaware.org Where to find more information American Chronic Pain Association: www.theacpa.org Contact a health care provider if: You have new symptoms. Your symptoms get worse or your pain is severe. You have side effects from your medicines. You have trouble sleeping. Your condition is causing depression or anxiety. Get help right away if: You have thoughts of hurting yourself or others. Get help right awayif you feel like you may hurt yourself or others, or have thoughts about taking your own life. Go to your nearest emergency room or: Call 911. Call the [facility] at  [phone] or 988. This is open 24 hours a day. Text the Crisis Text Line at (803)505-4982. Summary Myofascial pain syndrome and fibromyalgia are pain disorders. Myofascial pain syndrome has tender points in the muscles that will cause pain when pressed (trigger points). Fibromyalgia also has muscle pains and tenderness that come and go, but this condition is often associated with fatigue and sleep disturbances. Fibromyalgia and myofascial pain syndrome are not the same but often occur together, causing pain and fatigue that make day-to-day activities difficult. Follow your health care provider's instructions for taking medicines and maintaining a healthy lifestyle. This information is not intended to replace advice given to you by your health care provider. Make sure you discuss any questions you have with your health care provider. Document Revised: 07/04/2021 Document Reviewed: 07/04/2021 Elsevier Patient Education  [place].

## 2022-02-26 ENCOUNTER — Encounter: Payer: Self-pay | Admitting: Neurology

## 2022-02-27 LAB — CK TOTAL AND CKMB (NOT AT ARMC)
CK-MB Index: 4.3 ng/mL (ref 0.0–5.3)
Total CK: 188 U/L — ABNORMAL HIGH (ref 32–182)

## 2022-02-27 LAB — C-REACTIVE PROTEIN: CRP: 5 mg/L (ref 0–10)

## 2022-02-27 LAB — ANA,IFA RA DIAG PNL W/RFLX TIT/PATN
ANA Titer 1: NEGATIVE
Cyclic Citrullin Peptide Ab: 4 units (ref 0–19)
Rheumatoid fact SerPl-aCnc: 10.5 IU/mL (ref <14.0)

## 2022-03-02 ENCOUNTER — Other Ambulatory Visit: Payer: Self-pay | Admitting: *Deleted

## 2022-03-09 DIAGNOSIS — E538 Deficiency of other specified B group vitamins: Secondary | ICD-10-CM | POA: Diagnosis not present

## 2022-03-09 DIAGNOSIS — E785 Hyperlipidemia, unspecified: Secondary | ICD-10-CM | POA: Diagnosis not present

## 2022-03-09 DIAGNOSIS — Z0000 Encounter for general adult medical examination without abnormal findings: Secondary | ICD-10-CM | POA: Diagnosis not present

## 2022-03-09 DIAGNOSIS — R7989 Other specified abnormal findings of blood chemistry: Secondary | ICD-10-CM | POA: Diagnosis not present

## 2022-03-09 DIAGNOSIS — M109 Gout, unspecified: Secondary | ICD-10-CM | POA: Diagnosis not present

## 2022-03-09 DIAGNOSIS — D649 Anemia, unspecified: Secondary | ICD-10-CM | POA: Diagnosis not present

## 2022-03-09 DIAGNOSIS — I10 Essential (primary) hypertension: Secondary | ICD-10-CM | POA: Diagnosis not present

## 2022-03-16 DIAGNOSIS — C50919 Malignant neoplasm of unspecified site of unspecified female breast: Secondary | ICD-10-CM | POA: Diagnosis not present

## 2022-03-16 DIAGNOSIS — M25562 Pain in left knee: Secondary | ICD-10-CM | POA: Diagnosis not present

## 2022-03-16 DIAGNOSIS — N1832 Chronic kidney disease, stage 3b: Secondary | ICD-10-CM | POA: Diagnosis not present

## 2022-03-16 DIAGNOSIS — I2510 Atherosclerotic heart disease of native coronary artery without angina pectoris: Secondary | ICD-10-CM | POA: Diagnosis not present

## 2022-03-16 DIAGNOSIS — Z1339 Encounter for screening examination for other mental health and behavioral disorders: Secondary | ICD-10-CM | POA: Diagnosis not present

## 2022-03-16 DIAGNOSIS — Z0000 Encounter for general adult medical examination without abnormal findings: Secondary | ICD-10-CM | POA: Diagnosis not present

## 2022-03-16 DIAGNOSIS — I119 Hypertensive heart disease without heart failure: Secondary | ICD-10-CM | POA: Diagnosis not present

## 2022-03-16 DIAGNOSIS — R3589 Other polyuria: Secondary | ICD-10-CM | POA: Diagnosis not present

## 2022-03-16 DIAGNOSIS — Z1331 Encounter for screening for depression: Secondary | ICD-10-CM | POA: Diagnosis not present

## 2022-03-16 DIAGNOSIS — M109 Gout, unspecified: Secondary | ICD-10-CM | POA: Diagnosis not present

## 2022-03-16 DIAGNOSIS — R768 Other specified abnormal immunological findings in serum: Secondary | ICD-10-CM | POA: Diagnosis not present

## 2022-03-16 DIAGNOSIS — I10 Essential (primary) hypertension: Secondary | ICD-10-CM | POA: Diagnosis not present

## 2022-03-30 ENCOUNTER — Encounter: Payer: Medicare Other | Admitting: Adult Health

## 2022-03-31 ENCOUNTER — Encounter: Payer: Self-pay | Admitting: Adult Health

## 2022-03-31 ENCOUNTER — Inpatient Hospital Stay: Payer: Medicare Other | Attending: Adult Health | Admitting: Adult Health

## 2022-03-31 ENCOUNTER — Other Ambulatory Visit: Payer: Self-pay

## 2022-03-31 VITALS — BP 122/60 | HR 65 | Temp 97.7°F | Resp 18 | Ht 62.0 in | Wt 141.2 lb

## 2022-03-31 DIAGNOSIS — I10 Essential (primary) hypertension: Secondary | ICD-10-CM | POA: Insufficient documentation

## 2022-03-31 DIAGNOSIS — E785 Hyperlipidemia, unspecified: Secondary | ICD-10-CM | POA: Insufficient documentation

## 2022-03-31 DIAGNOSIS — R0602 Shortness of breath: Secondary | ICD-10-CM | POA: Insufficient documentation

## 2022-03-31 DIAGNOSIS — Z9049 Acquired absence of other specified parts of digestive tract: Secondary | ICD-10-CM | POA: Insufficient documentation

## 2022-03-31 DIAGNOSIS — Z8719 Personal history of other diseases of the digestive system: Secondary | ICD-10-CM | POA: Diagnosis not present

## 2022-03-31 DIAGNOSIS — I252 Old myocardial infarction: Secondary | ICD-10-CM | POA: Diagnosis not present

## 2022-03-31 DIAGNOSIS — Z9013 Acquired absence of bilateral breasts and nipples: Secondary | ICD-10-CM | POA: Insufficient documentation

## 2022-03-31 DIAGNOSIS — Z79899 Other long term (current) drug therapy: Secondary | ICD-10-CM | POA: Insufficient documentation

## 2022-03-31 DIAGNOSIS — Z923 Personal history of irradiation: Secondary | ICD-10-CM | POA: Insufficient documentation

## 2022-03-31 DIAGNOSIS — Z170 Estrogen receptor positive status [ER+]: Secondary | ICD-10-CM | POA: Diagnosis not present

## 2022-03-31 DIAGNOSIS — E2839 Other primary ovarian failure: Secondary | ICD-10-CM

## 2022-03-31 DIAGNOSIS — Z90722 Acquired absence of ovaries, bilateral: Secondary | ICD-10-CM | POA: Insufficient documentation

## 2022-03-31 DIAGNOSIS — Z803 Family history of malignant neoplasm of breast: Secondary | ICD-10-CM | POA: Diagnosis not present

## 2022-03-31 DIAGNOSIS — I2510 Atherosclerotic heart disease of native coronary artery without angina pectoris: Secondary | ICD-10-CM | POA: Diagnosis not present

## 2022-03-31 DIAGNOSIS — C50411 Malignant neoplasm of upper-outer quadrant of right female breast: Secondary | ICD-10-CM | POA: Insufficient documentation

## 2022-03-31 DIAGNOSIS — Z8249 Family history of ischemic heart disease and other diseases of the circulatory system: Secondary | ICD-10-CM | POA: Insufficient documentation

## 2022-03-31 NOTE — Progress Notes (Signed)
[facility] Follow up:    Tracey Hatchet, MD [place] [address]   DIAGNOSIS:  Cancer Staging  Breast cancer of upper-outer quadrant of right female breast [facility]) Staging form: Breast, AJCC 7th Edition - Clinical: Stage IA (T1a, N0, cM0) - Unsigned Specimen type: Core Needle Biopsy Histopathologic type: 9931 Laterality: Right - Pathologic: No stage assigned - Unsigned Specimen type: Core Needle Biopsy Histopathologic type: 9931 Laterality: Right   SUMMARY OF ONCOLOGIC HISTORY: (1)  status post right breast upper outer quadrant biopsy 03/24/2013 for a pT1b cN0, stage IA invasive ductal carcinoma, grade 1, strongly estrogen and progesterone receptor positive, HER-2 negative   (2) status post bilateral mastectomies 05/23/2013 showing:             (a) on the right, a pT1b pN0, stage invasive ductal carcinoma, grade 1, again HER-2 negative             (b) on the left, no evidence of malignancy   (3) Oncotype DX score of 20 predicted a rate of distant recurrence within 10 years of 13% if the patient's only systemic treatment was tamoxifen for 5 years.   (4) a positive margin required adjuvant radiation, completed January 2015 in [place]   (5) status post bilateral implant placement January 2015             (a) with impending right implant rupture February 2016 underwent bilateral DIEP reconstruction under Dr. Kandyce Rud in [place] (fax [phone])   (6)  started on anastrozole, mid September 2014, prior to definitive surgery; continued postop; bone density 07/31/2013 was normal             (a) repeat bone density 02/03/2016 showed a T score of -0.1 (normal             (b) switched to tamoxifen November 2018 because of arthralgias secondary to the anastrozole             (c) switched to exemestane as of 12/13/2017             (d) completing five years of anti-estrogens September 2019  CURRENT THERAPY: Observation  INTERVAL  HISTORY: Tracey [name] 76 y.o. female returns for follow-up of her history of estrogen positive breast cancer.  She continues on observation alone with no current concerns.  She is status post bilateral mastectomies and no longer undergoes annual mammograms.  She notes that she has been experiencing increased left knee pain which was finally relieved with the prednisone taper.  She notes since this time she has developed some neuropathy in her heel and wonders what this could be from.  She also notes that she has an upcoming rheumatology appointment as her ANA was positive.  She has no other new concerns today.   Patient Active Problem List   Diagnosis Date Noted   ARF (acute renal failure) ([place]) 12/30/2021   SOB (shortness of breath) 12/29/2021   S/P total knee arthroplasty, left 12/02/2021   Preoperative cardiovascular examination 11/18/2021   REM behavioral disorder 06/12/2021   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   CPAP (continuous positive airway pressure) dependence 01/20/2021   Unsteady gait when walking 01/20/2021   Complaint related to dreams 02/33/4356   Metabolic acidosis with increased anion gap and accumulation of organic acids 08/17/2018   Nausea, vomiting, and diarrhea 08/17/2018   AKI (acute kidney injury) ([place]) 08/17/2018   Overweight (BMI 25.0-29.9) 07/27/2018   Status post total  left knee replacement 07/27/2018   S/P right TKA 07/26/2018   S/P knee replacement 07/26/2018   UARS (upper airway resistance syndrome) 05/30/2018   Hyperlipidemia LDL goal <70 05/02/2018   Leg injury, right, initial encounter 01/06/2017   Contusion of right lower leg 01/06/2017   Hypoxemia 12/23/2015   Statin myopathy 05/20/2015   MCI (mild cognitive impairment) 05/20/2015   Edema 05/17/2015   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  MEDICAL HISTORY: Past Medical History:  Diagnosis Date   Allergy    Anxiety    takes Ativan daily prn   Arthritis    Breast cancer ([place] [facility]) 2014   ER+/PR+/HEr2-,    Bruises easily    Colon polyps    2 polyps by report   Coronary artery disease involving native coronary artery of native heart without angina pectoris 01/01/2014   NSTEMI in January 2019 in [place]>> s/p DES x2 to the LAD (procedure complicated by stent thrombosis) Myoview 05/2020: EF 72, no infarct or ischemia; low risk Echocardiogram 05/2019: EF 60-65, GLS -21.5 Cardiac catheter[facility] at [facility] in 08/2017: Normal left main, normal LCx, normal RCA; LAD/diagonal 95%>> PCI    Diverticulosis    GERD (gastroesophageal reflux disease)    occasionally takes Nexium    Gout    takes Allopurinol daily and Colchicine daily prn;last attack 81yr ago   Gout    History of bladder infections    many yrs ago   History of bronchitis    last time at least 817yrago   History of stress incontinence    Hyperlipidemia    takes Pravastatin daily   Hypertension    Insomnia    takes Ambien nightly prn   NSTEMI (non-ST elevated myocardial infarction) ([facility]/2019   OSA (obstructive sleep apnea)    on CPAP   Osteoarthritis    Peripheral edema    takes Furosemide daily prn   PONV (postoperative nausea and vomiting)    Postmenopausal hormone therapy    Radiation 07/27/13-09/07/13   Right Breast x 31 treatments   Rhinitis    uses Flonase prn   Sinus congestion    Status post breast  reconstruction 10/15/2014   Bilateral implant removal and DIEP performed in [place]   Tachycardia    takes Metoprolol daily   Ventricular tachycardia, non-sustained ([facility]   during sleep study 2009 with normal cardiac workup    SURGICAL HISTORY: Past Surgical History:  Procedure Laterality Date   ABDOMINAL HYSTERECTOMY     with BSO   APPENDECTOMY     AXILLARY SENTINEL NODE BIOPSY Right 05/23/2013   Procedure: AXILLARY SENTINEL NODE BIOPSY;  Surgeon: ToOdis HollingsheadMD;  [place] Service: General;  Laterality: Right;  nuc med injection 7:00   BREAST RECONSTRUCTION WITH PLACEMENT OF TISSUE EXPANDER AND FLEX HD (ACELLULAR HYDRATED DERMIS) Bilateral 05/23/2013   Procedure: BILATERAL BREAST RECONSTRUCTION WITH PLACEMENT OF TISSUE EXPANDER AND FLEX HD;  Surgeon: DaCrissie ReeseMD;  Location: [facility];  Service: Plastics;  Laterality: Bilateral;   CARDIAC CATHETERIZATION  1999   CATARACT EXTRACTION, BILATERAL Bilateral    CHOLECYSTECTOMY     COSMETIC SURGERY     OTHER SURGICAL HISTORY  09/2017   had cyst removal from spine    PERCUTANEOUS CORONARY STENT INTERVENTION (PCI-S)  08/2017   done in [place] OF BILATERAL BREAST IMPLANTS Bilateral 05/17/2014   Procedure: REMOVAL OF BILATERAL TISSUE EXPANDERS WITH PLACEMENT OF BILATERAL BREAST IMPLANTS FOR RECONSTRUCTION;  Surgeon: [name], MD;  Location: [facility];  Service: Plastics;  Laterality: Bilateral;   TONSILLECTOMY     TOTAL KNEE ARTHROPLASTY Right 07/26/2018   Procedure: TOTAL KNEE ARTHROPLASTY;  Surgeon: Paralee Cancel, MD;  Location: [facility];  Service: Orthopedics;  Laterality: Right;  70 mins   TOTAL KNEE ARTHROPLASTY Left 12/02/2021   Procedure: TOTAL KNEE ARTHROPLASTY;  Surgeon: Paralee Cancel, MD;  Location: [facility];  Service: Orthopedics;  Laterality: Left;   TOTAL MASTECTOMY Bilateral 05/23/2013   Procedure: TOTAL MASTECTOMY;  Surgeon: [name], MD;  Location:  [place];  Service: General;  Laterality: Bilateral;    SOCIAL HISTORY: Social History   Socioeconomic History   Marital status: Married    Spouse name: Not on file   Number of children: 2   Years of education: Not on file   Highest education level: Not on file  Occupational History   Not on file  Tobacco Use   Smoking status: Former    Packs/day: 1.00    Years: 20.00    Total pack years: 20.00    Types: Cigarettes    Start date: 06/09/1992   Smokeless tobacco: Never   Tobacco comments:    quit at age 58  Vaping Use   Vaping Use: Never used  Substance and Sexual Activity   Alcohol use: Yes    Alcohol/week: 7.0 - 14.0 standard drinks of alcohol    Types: 7 - 14 Glasses of wine per week    Comment: daily   Drug use: Yes    Types: Other-see comments    Comment: quit 24 years ago.   CBD oil topical.   Sexual activity: Yes    Birth control/protection: Surgical  Other Topics Concern   Not on file  Social History Narrative   Not on file   Social Determinants of Health   Financial Resource Strain: Not on file  Food Insecurity: Not on file  Transportation Needs: Not on file  Physical Activity: Not on file  Stress: Not on file  Social Connections: Not on file  Intimate Partner Violence: Not on file    FAMILY HISTORY: Family History  Problem Relation Age of Onset   Breast cancer Mother        possible inflammatory breast cancer   Heart attack Father    Heart disease Father    ALS Sister    Breast cancer Maternal Grandmother 43   Heart attack Maternal Grandfather    Heart attack Paternal Grandfather    Breast cancer Maternal Aunt        dx in her 1s   Breast cancer Other        maternal great grandmother; dx in her 70s    Review of Systems  Constitutional:  Negative for appetite change, chills, fatigue, fever and unexpected weight change.  HENT:   Negative for hearing loss, lump/mass and trouble swallowing.   Eyes:  Negative for eye  problems and icterus.   Respiratory:  Negative for chest tightness, cough and shortness of breath.   Cardiovascular:  Negative for chest pain, leg swelling and palpitations.  Gastrointestinal:  Negative for abdominal distention, abdominal pain, constipation, diarrhea, nausea and vomiting.  Endocrine: Negative for hot flashes.  Genitourinary:  Negative for difficulty urinating.   Musculoskeletal:  Negative for arthralgias.  Skin:  Negative for itching and rash.  Neurological:  Positive for numbness. Negative for dizziness, extremity weakness and headaches.  Hematological:  Negative for adenopathy. Does not bruise/bleed easily.  Psychiatric/Behavioral:  Negative for depression. The patient is not nervous/anxious.       PHYSICAL EXAMINATION  ECOG PERFORMANCE STATUS: 1 - Symptomatic but completely ambulatory  Vitals:   03/31/22 1120  BP: 122/60  Pulse: 65  Resp: 18  Temp: 97.7 F (36.5 C)  SpO2: 100%    Physical Exam Constitutional:      General: She is not in acute distress.    Appearance: Normal appearance. She is not toxic-appearing.  HENT:     Head: Normocephalic and atraumatic.  Eyes:     General: No scleral icterus. Cardiovascular:     Rate and Rhythm: Normal rate and regular rhythm.     Pulses: Normal pulses.     Heart sounds: Normal heart sounds.  Pulmonary:     Effort: Pulmonary effort is normal.     Breath sounds: Normal breath sounds.  Chest:     Comments: Status post bilateral mastectomies with reconstruction no sign of local recurrence. Abdominal:     General: Abdomen is flat. Bowel sounds are normal. There is no distension.     Palpations: Abdomen is soft.     Tenderness: There is no abdominal tenderness.  Musculoskeletal:        General: No swelling.     Cervical back: Neck supple.  Lymphadenopathy:     Cervical: No cervical adenopathy.  Skin:    General: Skin is warm and dry.     Findings: No rash.  Neurological:     General: No focal deficit present.     Mental  Status: She is alert.  Psychiatric:        Mood and Affect: Mood normal.        Behavior: Behavior normal.     LABORATORY DATA: None for this visit   ASSESSMENT and THERAPY PLAN:   Breast cancer of upper-outer quadrant of right female breast ([place]) Tracey [name] is a pleasant 76 y.o. female with history of Stage IA right breast invasive ductal carcinoma, ER+/PR+/HER2-, diagnosed in 03/2013, treated with bilateral mastectomies, and anti estrogen therapy with aromatase inhibitors x 5 years completed in 04/2018.  [facility] for surveillance and routine follow-up.   Tracey [name] has no clinical sign of breast cancer recurrence.  She is struggled with her knee which could be the cause of the discomfort in her heel.  If it does not improve in the next couple of weeks I did recommend that she talk to Dr. Ardeth Perfect her primary care about it.  She does want her bone density testing scheduled and I will place orders for this today.  We will see Tracey [name] back in 1 year for continued surveillance and monitoring.   All questions were answered. The patient knows to call the clinic with any problems, questions or concerns. We can certainly see the patient much sooner if necessary.  Total encounter time:30 minutes*in face-to-face visit time, chart review, lab review, care coordination, order entry, and  documentation of the encounter time.    Wilber Bihari, NP 03/31/22 1:53 PM Medical Oncology and Hematology [facility] [place], [place] [address] Tel. [phone]    Fax. [phone]  *Total Encounter Time as defined by the Centers for Medicare and Medicaid Services includes, in addition to the face-to-face time of a patient visit (documented in the note above) non-face-to-face time: obtaining and reviewing outside history, ordering and reviewing medications, tests or procedures, care coordination (communications with other health care professionals or  caregivers) and documentation in the medical record.

## 2022-03-31 NOTE — Assessment & Plan Note (Addendum)
Ms.. Tracey [name] is a pleasant 76 y.o. female with history of Stage IA right breast invasive ductal carcinoma, ER+/PR+/HER2-, diagnosed in 03/2013, treated with bilateral mastectomies, and anti estrogen therapy with aromatase inhibitors x 5 years completed in 04/2018.  She presents to [facility] for surveillance and routine follow-up.   Tracey [name] has no clinical sign of breast cancer recurrence.  She is struggled with her knee which could be the cause of the discomfort in her heel.  If it does not improve in the next couple of weeks I did recommend that she talk to Dr. Ardeth Perfect her primary care about it.  She does want her bone density testing scheduled and I will place orders for this today.  We will see Tracey [name] back in 1 year for continued surveillance and monitoring.

## 2022-04-03 ENCOUNTER — Encounter: Payer: Self-pay | Admitting: Adult Health

## 2022-04-08 ENCOUNTER — Ambulatory Visit (HOSPITAL_BASED_OUTPATIENT_CLINIC_OR_DEPARTMENT_OTHER)
Admission: RE | Admit: 2022-04-08 | Discharge: 2022-04-08 | Disposition: A | Discharge status: Home / Self Care | Payer: Medicare Other | Source: Ambulatory Visit | Attending: Adult Health | Admitting: Adult Health

## 2022-04-08 DIAGNOSIS — E2839 Other primary ovarian failure: Secondary | ICD-10-CM | POA: Diagnosis not present

## 2022-04-08 DIAGNOSIS — C50411 Malignant neoplasm of upper-outer quadrant of right female breast: Secondary | ICD-10-CM | POA: Diagnosis not present

## 2022-04-08 DIAGNOSIS — M85851 Other specified disorders of bone density and structure, right thigh: Secondary | ICD-10-CM | POA: Diagnosis not present

## 2022-04-13 ENCOUNTER — Telehealth: Payer: Self-pay | Admitting: *Deleted

## 2022-04-13 NOTE — Telephone Encounter (Signed)
RN attempt x1 to contact pt regarding recent bone density report results with the recommendations from Wilber Bihari NP for pt to take Calcium 1200 mg daily along with Vitamin D 25 mcg daily for Osteopenia.  No answer, LVM for pt to return call to the office.

## 2022-04-13 NOTE — Telephone Encounter (Signed)
-----   Message from Gardenia Phlegm, NP sent at 04/12/2022 10:32 PM EDT ----- Regarding: FW: Bone density results show very mild osteopenia.  I recommend calcium, vitamin d and weight bearing exercises ----- Message ----- From: Interface, Rad Results In Sent: 04/08/2022  11:50 AM EDT To: Gardenia Phlegm, NP

## 2022-04-28 DIAGNOSIS — R6884 Jaw pain: Secondary | ICD-10-CM | POA: Diagnosis not present

## 2022-04-28 DIAGNOSIS — M2540 Effusion, unspecified joint: Secondary | ICD-10-CM | POA: Diagnosis not present

## 2022-04-28 DIAGNOSIS — R519 Headache, unspecified: Secondary | ICD-10-CM | POA: Diagnosis not present

## 2022-04-28 DIAGNOSIS — M79642 Pain in left hand: Secondary | ICD-10-CM | POA: Diagnosis not present

## 2022-04-28 DIAGNOSIS — R768 Other specified abnormal immunological findings in serum: Secondary | ICD-10-CM | POA: Diagnosis not present

## 2022-04-28 DIAGNOSIS — Z6826 Body mass index (BMI) 26.0-26.9, adult: Secondary | ICD-10-CM | POA: Diagnosis not present

## 2022-04-28 DIAGNOSIS — M79641 Pain in right hand: Secondary | ICD-10-CM | POA: Diagnosis not present

## 2022-04-28 DIAGNOSIS — M2560 Stiffness of unspecified joint, not elsewhere classified: Secondary | ICD-10-CM | POA: Diagnosis not present

## 2022-04-28 DIAGNOSIS — E663 Overweight: Secondary | ICD-10-CM | POA: Diagnosis not present

## 2022-05-04 DIAGNOSIS — H524 Presbyopia: Secondary | ICD-10-CM | POA: Diagnosis not present

## 2022-05-04 DIAGNOSIS — H04123 Dry eye syndrome of bilateral lacrimal glands: Secondary | ICD-10-CM | POA: Diagnosis not present

## 2022-05-13 ENCOUNTER — Encounter: Payer: Self-pay | Admitting: Vascular Surgery

## 2022-05-13 ENCOUNTER — Ambulatory Visit (INDEPENDENT_AMBULATORY_CARE_PROVIDER_SITE_OTHER): Payer: Medicare Other | Admitting: Vascular Surgery

## 2022-05-13 ENCOUNTER — Other Ambulatory Visit: Payer: Self-pay

## 2022-05-13 VITALS — BP 122/71 | HR 65 | Temp 98.0°F | Resp 14 | Ht 62.0 in | Wt 144.0 lb

## 2022-05-13 DIAGNOSIS — M316 Other giant cell arteritis: Secondary | ICD-10-CM | POA: Diagnosis not present

## 2022-05-13 DIAGNOSIS — I2510 Atherosclerotic heart disease of native coronary artery without angina pectoris: Secondary | ICD-10-CM

## 2022-05-13 NOTE — Progress Notes (Signed)
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  Temporal arteritis is in the differential diagnosis and we have been asked to perform a left temporal artery biopsy.  I have discussed the indications for the procedure and the potential complications and she is agreeable to proceed.  The surgery scheduled for Friday, 05/15/2022 with Dr. Carlis Abbott.  All of her questions were answered and she is agreeable to proceed.  REASON FOR CONSULT:    To evaluate for a temporal artery biopsy.  Consult is requested by Dr. Christinia Gully.   HPI:   Tracey [name] is a 76 y.o. female who has had left temporal headaches off and on for several months now.  She had a positive ANA and temporal arteritis is in the differential diagnosis.  For this reason she was sent for a left temporal artery biopsy.  She denies any visual disturbance.  Patient had a knee replacement on the left and had been having some intermittent infections.  This prompted an extensive work-up and ultimately she was found to have a positive ANA.  She is on aspirin and is on Plavix.  She is undergone previous PTCA.  Her cardiologist is Dr. Sherren Mocha.  Past Medical History:  Diagnosis Date   Allergy    Anxiety    takes Ativan daily prn   Arthritis    Breast cancer ([facility]) 2014   ER+/PR+/HEr2-,    Bruises easily    Colon polyps    2 polyps by report   Coronary artery disease involving native coronary artery of native heart without angina pectoris 01/01/2014   NSTEMI in January 2019 in [place]>> s/p DES x2 to the LAD (procedure complicated by stent thrombosis) Myoview 05/2020: EF 72, no infarct or ischemia; low risk Echocardiogram 05/2019: EF 60-65, GLS -21.5 Cardiac catheterization at [facility] in 08/2017: Normal left main, normal LCx, normal RCA; LAD/diagonal 95%>> PCI    Diverticulosis    GERD (gastroesophageal reflux  disease)    occasionally takes Nexium    Gout    takes Allopurinol daily and Colchicine daily prn;last attack 97yr ago   Gout    History of bladder infections    many yrs ago   History of bronchitis    last time at least 860yrago   History of stress incontinence    Hyperlipidemia    takes Pravastatin daily   Hypertension    Insomnia    takes Ambien nightly prn   [place] (non-ST elevated myocardial infarction) ([facility]   OSA (obstructive sleep apnea)    on CPAP   Osteoarthritis    Peripheral edema    takes Furosemide daily prn   PONV (postoperative nausea and vomiting)    Postmenopausal hormone therapy    Radiation 07/27/13-09/07/13   Right Breast x 31 treatments   Rhinitis    uses Flonase prn   Sinus congestion    Status post breast reconstruction 10/15/2014   Bilateral implant removal and DIEP performed in [place]   Tachycardia    takes Metoprolol daily   [place]ent[place]ust[place] [place]   during sleep study 2009 with normal cardiac workup    Family History  Problem Relation Age of Onset   Breast cancer Mother        possible inflammatory breast cancer   Heart attack Father    Heart disease Father    ALS  Sister    Breast cancer Maternal Grandmother 11   Heart attack Maternal Grandfather    Heart attack Paternal Grandfather    Breast cancer Maternal Aunt        dx in her 15s   Breast cancer Other        maternal great grandmother; dx in her 25s    SOCIAL HISTORY: Social History   Tobacco Use   Smoking status: Former    Packs/day: 1.00    Years: 20.00    Total pack years: 20.00    Types: Cigarettes    Start date: 06/09/1992   Smokeless tobacco: Never   Tobacco comments:    quit at age 8  Substance Use Topics   Alcohol use: Yes    Alcohol/week: 7.0 - 14.0 standard drinks of alcohol    Types: 7 - 14 Glasses of wine per week    Comment: daily    Allergies  Allergen Reactions   Pseudoeph-Hydrocodone-Gg Other (See Comments)    Ivp Dye [Iodinated Contrast Media] Hives   Oxycodone Other (See Comments)    hallucinations   Sulfa Antibiotics Swelling   Tape Other (See Comments)    Unknown; tears skin ; ok to use paper tape     Current Outpatient Medications  Medication Sig Dispense Refill   allopurinol (ZYLOPRIM) 100 MG tablet TAKE 1 TABLET BY MOUTH ONCE DAILY (Patient taking differently: Take 100 mg by mouth daily.) 90 tablet 0   ASPIRIN LOW DOSE 81 MG chewable tablet Chew 81 mg by mouth daily.   0   Cholecalciferol (VITAMIN D3) 50 MCG (2000 UT) TABS Take 2,000 Units by mouth daily.     clopidogrel (PLAVIX) 75 MG tablet Take 1 tablet (75 mg total) by mouth daily. 90 tablet 3   cyanocobalamin (,VITAMIN B-12,) 1000 MCG/ML injection Inject 1,000 mcg into the muscle every 30 (thirty) days.     Doxylamine-DM & DM (VICKS DAYQUIL/NYQUIL COUGH) 6.25-15 & 15 MG/15ML LQPK Take 15 mLs by mouth every 6 (six) hours as needed (allergy symptoms).     ezetimibe (ZETIA) 10 MG tablet TAKE 1 TABLET(10 MG) BY MOUTH DAILY (Patient taking differently: Take 10 mg by mouth daily.) 90 tablet 2   fluticasone (FLONASE) 50 MCG/ACT nasal spray Place 1 spray into both nostrils daily.     furosemide (LASIX) 40 MG tablet Take 40 mg by mouth daily as needed for fluid.      loratadine (CLARITIN) 10 MG tablet Take 10 mg by mouth daily.     methocarbamol (ROBAXIN) 500 MG tablet Take 1 tablet (500 mg total) by mouth every 8 (eight) hours as needed for muscle spasms. 10 tablet 0   metoprolol succinate (TOPROL-XL) 25 MG 24 hr tablet TAKE 1 TABLET(25 MG) BY MOUTH DAILY (Patient taking differently: Take 25 mg by mouth daily.) 90 tablet 3   Multiple Vitamin (MULTI-VITAMIN) tablet Take 1 tablet by mouth daily.     nitroGLYCERIN (NITROSTAT) 0.4 MG SL tablet Place 1 tablet (0.4 mg total) under the tongue every 5 (five) minutes as needed for chest pain. 25 tablet 3   NONFORMULARY OR COMPOUNDED ITEM Vitamin E vaginal cream 200u/ml.  One ml pv three times weekly. 36  each 1   pantoprazole (PROTONIX) 40 MG tablet TAKE 1 TABLET(40 MG) BY MOUTH DAILY (Patient taking differently: Take 40 mg by mouth daily.) 90 tablet 3   polyethylene glycol (MIRALAX / GLYCOLAX) 17 g packet Take 17 g by mouth daily as needed for mild constipation. 14 each  0   rosuvastatin (CRESTOR) 20 MG tablet Take 1 tablet (20 mg total) by mouth daily. 90 tablet 3   sertraline (ZOLOFT) 100 MG tablet Take 100 mg by mouth daily.     testosterone cypionate (DEPOTESTOSTERONE CYPIONATE) 200 MG/ML injection Inject 200 mg into the muscle See admin instructions. Inject 256m into the muscle every 45 days     No current facility-administered medications for this visit.    REVIEW OF SYSTEMS:  [X]  denotes positive finding, [ ]  denotes negative finding Cardiac  Comments:  Chest pain or chest pressure:    Shortness of breath upon exertion:    Short of breath when lying flat:    Irregular heart rhythm:        Vascular    Pain in calf, thigh, or hip brought on by ambulation:    Pain in feet at night that wakes you up from your sleep:     Blood clot in your veins:    Leg swelling:         Pulmonary    Oxygen at home:    Productive cough:     Wheezing:         Neurologic    Sudden weakness in arms or legs:     Sudden numbness in arms or legs:     Sudden onset of difficulty speaking or slurred speech:    Temporary loss of vision in one eye:     Problems with dizziness:         Gastrointestinal    Blood in stool:     Vomited blood:         Genitourinary    Burning when urinating:     Blood in urine:        Psychiatric    Major depression:         Hematologic    Bleeding problems:    Problems with blood clotting too easily:        Skin    Rashes or ulcers:        Constitutional    Fever or chills: x   -  PHYSICAL EXAM:   Vitals:   05/13/22 1021  BP: 122/71  Pulse: 65  Resp: 14  Temp: 98 F (36.7 C)  TempSrc: Temporal  SpO2: 99%  Weight: 144 lb (65.3 kg)  Height: 5'  2" (1.575 m)   Body mass index is 26.34 kg/m. GENERAL: The patient is a well-nourished female, in no acute distress. The vital signs are documented above. CARDIAC: There is a regular rate and rhythm.  VASCULAR: I do not detect carotid bruits. I could not palpate a temporal artery pulse on the left however she has a good Doppler signal over the temporal artery just anterior to the superior aspect of the left ear.  She does have a scar adjacent to this area where she had plastic surgery 25 years ago. PULMONARY: There is good air exchange bilaterally without wheezing or rales.  MUSCULOSKELETAL: There are no major deformities. NEUROLOGIC: No focal weakness or paresthesias are detected. SKIN: There are no ulcers or rashes noted. PSYCHIATRIC: The patient has a normal affect.  DATA:    LABS: Her antinuclear antibody was positive on 01/28/2022.  Sed rate on 01/28/2022 was 14.  [facility]

## 2022-05-14 ENCOUNTER — Encounter (HOSPITAL_COMMUNITY): Payer: Self-pay | Admitting: Vascular Surgery

## 2022-05-14 ENCOUNTER — Other Ambulatory Visit: Payer: Self-pay

## 2022-05-14 DIAGNOSIS — L905 Scar conditions and fibrosis of skin: Secondary | ICD-10-CM | POA: Diagnosis not present

## 2022-05-14 DIAGNOSIS — L810 Postinflammatory hyperpigmentation: Secondary | ICD-10-CM | POA: Diagnosis not present

## 2022-05-14 DIAGNOSIS — D692 Other nonthrombocytopenic purpura: Secondary | ICD-10-CM | POA: Diagnosis not present

## 2022-05-14 NOTE — Progress Notes (Signed)
Mrs Tracey [name] denies chest pain or shortness of breath. Patient denies having any s/s of Covid in her household, also denies any known exposure to Covid.  Mrs. Tracey [name] took last dose of Plavix on 05/13/22, patient was instructed to continue  ASA.  Mrs Tracey [name]'s PCP is [facility], cardiologist is Dr. Sherren Mocha.

## 2022-05-14 NOTE — Anesthesia Preprocedure Evaluation (Addendum)
Anesthesia Evaluation  Patient identified by MRN, date of birth, ID band Patient awake    Reviewed: Allergy & Precautions, NPO status , Patient's Chart, lab work & pertinent test results  History of Anesthesia Complications (+) PONV and history of anesthetic complications  Airway Mallampati: II  TM Distance: >3 FB Neck ROM: Full    Dental no notable dental hx. (+) Dental Advisory Given   Pulmonary sleep apnea and Continuous Positive Airway Pressure Ventilation , former smoker,    Pulmonary exam normal        Cardiovascular hypertension, Pt. on medications and Pt. on home beta blockers + CAD, + Past MI and + Cardiac Stents  Normal cardiovascular exam  Myo-perfusion 10/21  ? Nuclear stress EF: 72%. ? There was no ST segment deviation noted during stress. ? The study is normal. ? This is a low risk study. ? The left ventricular ejection fraction is hyperdynamic (>65%).   Normal pharmacologic nuclear stress test with no evidence for prior infarct or ischemia. Hyperdynamic LVEF.      Neuro/Psych Anxiety  Neuromuscular disease    GI/Hepatic Neg liver ROS, GERD  Medicated and Controlled,  Endo/Other  negative endocrine ROS  Renal/GU Renal disease     Musculoskeletal  (+) Arthritis , Gout   Abdominal   Peds  Hematology negative hematology ROS (+)   Anesthesia Other Findings   Reproductive/Obstetrics                            Anesthesia Physical  Anesthesia Plan  ASA: 3  Anesthesia Plan: General   Post-op Pain Management: Tylenol PO (pre-op)*   Induction: Intravenous  PONV Risk Score and Plan: 4 or greater and Ondansetron, Dexamethasone, Propofol infusion and Treatment may vary due to age or medical condition  Airway Management Planned: LMA  Additional Equipment: None  Intra-op Plan:   Post-operative Plan: Extubation in OR  Informed Consent: I have reviewed the patients  History and Physical, chart, labs and discussed the procedure including the risks, benefits and alternatives for the proposed anesthesia with the patient or authorized representative who has indicated his/her understanding and acceptance.     Dental advisory given  Plan Discussed with: Anesthesiologist and CRNA  Anesthesia Plan Comments: (APP note by Durel Salts, FNP )       Anesthesia Quick Evaluation

## 2022-05-15 ENCOUNTER — Encounter (HOSPITAL_COMMUNITY): Payer: Self-pay | Admitting: Vascular Surgery

## 2022-05-15 ENCOUNTER — Other Ambulatory Visit: Payer: Self-pay

## 2022-05-15 ENCOUNTER — Ambulatory Visit (HOSPITAL_BASED_OUTPATIENT_CLINIC_OR_DEPARTMENT_OTHER): Payer: Medicare Other | Admitting: Anesthesiology

## 2022-05-15 ENCOUNTER — Ambulatory Visit (HOSPITAL_COMMUNITY)
Admission: RE | Admit: 2022-05-15 | Discharge: 2022-05-15 | Disposition: A | Discharge status: Home / Self Care | Payer: Medicare Other | Attending: Vascular Surgery | Admitting: Vascular Surgery

## 2022-05-15 ENCOUNTER — Encounter (HOSPITAL_COMMUNITY)
Admission: RE | Disposition: A | Discharge status: Home / Self Care | Payer: Self-pay | Source: Home / Self Care | Attending: Vascular Surgery

## 2022-05-15 ENCOUNTER — Ambulatory Visit (HOSPITAL_COMMUNITY): Payer: Medicare Other | Admitting: Anesthesiology

## 2022-05-15 DIAGNOSIS — Z955 Presence of coronary angioplasty implant and graft: Secondary | ICD-10-CM | POA: Diagnosis not present

## 2022-05-15 DIAGNOSIS — Z79899 Other long term (current) drug therapy: Secondary | ICD-10-CM | POA: Diagnosis not present

## 2022-05-15 DIAGNOSIS — G4733 Obstructive sleep apnea (adult) (pediatric): Secondary | ICD-10-CM

## 2022-05-15 DIAGNOSIS — Z87891 Personal history of nicotine dependence: Secondary | ICD-10-CM | POA: Diagnosis not present

## 2022-05-15 DIAGNOSIS — F419 Anxiety disorder, unspecified: Secondary | ICD-10-CM | POA: Diagnosis not present

## 2022-05-15 DIAGNOSIS — I252 Old myocardial infarction: Secondary | ICD-10-CM | POA: Diagnosis not present

## 2022-05-15 DIAGNOSIS — I10 Essential (primary) hypertension: Secondary | ICD-10-CM | POA: Diagnosis not present

## 2022-05-15 DIAGNOSIS — I2510 Atherosclerotic heart disease of native coronary artery without angina pectoris: Secondary | ICD-10-CM | POA: Insufficient documentation

## 2022-05-15 DIAGNOSIS — R519 Headache, unspecified: Secondary | ICD-10-CM | POA: Diagnosis not present

## 2022-05-15 DIAGNOSIS — G709 Myoneural disorder, unspecified: Secondary | ICD-10-CM | POA: Diagnosis not present

## 2022-05-15 DIAGNOSIS — M1990 Unspecified osteoarthritis, unspecified site: Secondary | ICD-10-CM | POA: Diagnosis not present

## 2022-05-15 DIAGNOSIS — M316 Other giant cell arteritis: Secondary | ICD-10-CM | POA: Diagnosis not present

## 2022-05-15 DIAGNOSIS — Z9989 Dependence on other enabling machines and devices: Secondary | ICD-10-CM | POA: Diagnosis not present

## 2022-05-15 DIAGNOSIS — K219 Gastro-esophageal reflux disease without esophagitis: Secondary | ICD-10-CM | POA: Insufficient documentation

## 2022-05-15 DIAGNOSIS — M109 Gout, unspecified: Secondary | ICD-10-CM | POA: Diagnosis not present

## 2022-05-15 HISTORY — DX: Headache, unspecified: R51.9

## 2022-05-15 HISTORY — PX: ARTERY BIOPSY: SHX891

## 2022-05-15 HISTORY — DX: Personal history of other diseases of the digestive system: Z87.19

## 2022-05-15 LAB — BASIC METABOLIC PANEL
Anion gap: 9 (ref 5–15)
BUN: 37 mg/dL — ABNORMAL HIGH (ref 8–23)
CO2: 20 mmol/L — ABNORMAL LOW (ref 22–32)
Calcium: 9.4 mg/dL (ref 8.9–10.3)
Chloride: 110 mmol/L (ref 98–111)
Creatinine, Ser: 1.37 mg/dL — ABNORMAL HIGH (ref 0.44–1.00)
GFR, Estimated: 40 mL/min — ABNORMAL LOW (ref >60)
Glucose, Bld: 129 mg/dL — ABNORMAL HIGH (ref 70–99)
Potassium: 5.1 mmol/L (ref 3.5–5.1)
Sodium: 139 mmol/L (ref 135–145)

## 2022-05-15 LAB — CBC
HCT: 43.2 % (ref 36.0–46.0)
Hemoglobin: 14.1 g/dL (ref 12.0–15.0)
MCH: 28.5 pg (ref 26.0–34.0)
MCHC: 32.6 g/dL (ref 30.0–36.0)
MCV: 87.3 fL (ref 80.0–100.0)
Platelets: 189 10*3/uL (ref 150–400)
RBC: 4.95 MIL/uL (ref 3.87–5.11)
RDW: 17.3 % — ABNORMAL HIGH (ref 11.5–15.5)
WBC: 10.9 10*3/uL — ABNORMAL HIGH (ref 4.0–10.5)
nRBC: 0 % (ref 0.0–0.2)

## 2022-05-15 SURGERY — BIOPSY TEMPORAL ARTERY
Anesthesia: General | Laterality: Left

## 2022-05-15 MED ORDER — 0.9 % SODIUM CHLORIDE (POUR BTL) OPTIME
TOPICAL | Status: DC | PRN
  Administered 2022-05-15: 1000 mL

## 2022-05-15 MED ORDER — CEFAZOLIN SODIUM-DEXTROSE 2-4 GM/100ML-% IV SOLN
2.0000 g | INTRAVENOUS | Status: AC
  Administered 2022-05-15: 2 g via INTRAVENOUS
  Filled 2022-05-15: qty 100

## 2022-05-15 MED ORDER — SODIUM CHLORIDE 0.9 % IV SOLN: INTRAVENOUS | Status: DC

## 2022-05-15 MED ORDER — HEPARIN SODIUM (PORCINE) 1000 UNIT/ML IJ SOLN
INTRAMUSCULAR | Status: AC
  Filled 2022-05-15: qty 10

## 2022-05-15 MED ORDER — DEXAMETHASONE SODIUM PHOSPHATE 10 MG/ML IJ SOLN
INTRAMUSCULAR | Status: AC
  Filled 2022-05-15: qty 1

## 2022-05-15 MED ORDER — ONDANSETRON HCL 4 MG/2ML IJ SOLN
INTRAMUSCULAR | Status: AC
  Filled 2022-05-15: qty 2

## 2022-05-15 MED ORDER — PHENYLEPHRINE HCL (PRESSORS) 10 MG/ML IV SOLN
INTRAVENOUS | Status: DC | PRN
  Administered 2022-05-15: 80 ug via INTRAVENOUS

## 2022-05-15 MED ORDER — PROPOFOL 10 MG/ML IV BOLUS
INTRAVENOUS | Status: AC
  Filled 2022-05-15: qty 20

## 2022-05-15 MED ORDER — LACTATED RINGERS IV SOLN: INTRAVENOUS | Status: DC | PRN

## 2022-05-15 MED ORDER — LIDOCAINE 2% (20 MG/ML) 5 ML SYRINGE
INTRAMUSCULAR | Status: AC
  Filled 2022-05-15: qty 5

## 2022-05-15 MED ORDER — GLYCOPYRROLATE PF 0.2 MG/ML IJ SOSY
PREFILLED_SYRINGE | INTRAMUSCULAR | Status: AC
  Filled 2022-05-15: qty 1

## 2022-05-15 MED ORDER — CHLORHEXIDINE GLUCONATE 4 % EX LIQD: 60.0000 mL | Freq: Once | CUTANEOUS | Status: DC

## 2022-05-15 MED ORDER — MIDAZOLAM HCL 2 MG/2ML IJ SOLN
INTRAMUSCULAR | Status: DC | PRN
  Administered 2022-05-15: 2 mg via INTRAVENOUS

## 2022-05-15 MED ORDER — AMISULPRIDE (ANTIEMETIC) 5 MG/2ML IV SOLN: 10.0000 mg | Freq: Once | INTRAVENOUS | Status: DC | PRN

## 2022-05-15 MED ORDER — CHLORHEXIDINE GLUCONATE 0.12 % MT SOLN
15.0000 mL | Freq: Once | OROMUCOSAL | Status: AC
  Filled 2022-05-15: qty 15

## 2022-05-15 MED ORDER — PROMETHAZINE HCL 25 MG/ML IJ SOLN: 6.2500 mg | INTRAMUSCULAR | Status: DC | PRN

## 2022-05-15 MED ORDER — FENTANYL CITRATE (PF) 100 MCG/2ML IJ SOLN
25.0000 ug | INTRAMUSCULAR | Status: DC | PRN
  Administered 2022-05-15: 25 ug via INTRAVENOUS

## 2022-05-15 MED ORDER — DEXAMETHASONE SODIUM PHOSPHATE 10 MG/ML IJ SOLN
INTRAMUSCULAR | Status: DC | PRN
  Administered 2022-05-15: 10 mg via INTRAVENOUS

## 2022-05-15 MED ORDER — FENTANYL CITRATE (PF) 250 MCG/5ML IJ SOLN
INTRAMUSCULAR | Status: AC
  Filled 2022-05-15: qty 5

## 2022-05-15 MED ORDER — TRAMADOL HCL 50 MG PO TABS: 50.0000 mg | ORAL_TABLET | Freq: Four times a day (QID) | ORAL | 0 refills | Status: AC | PRN

## 2022-05-15 MED ORDER — PROPOFOL 10 MG/ML IV BOLUS
INTRAVENOUS | Status: DC | PRN
  Administered 2022-05-15: 120 mg via INTRAVENOUS

## 2022-05-15 MED ORDER — FENTANYL CITRATE (PF) 100 MCG/2ML IJ SOLN
INTRAMUSCULAR | Status: AC
  Filled 2022-05-15: qty 2

## 2022-05-15 MED ORDER — LACTATED RINGERS IV SOLN: INTRAVENOUS | Status: DC

## 2022-05-15 MED ORDER — GLYCOPYRROLATE 0.2 MG/ML IJ SOLN
INTRAMUSCULAR | Status: DC | PRN
  Administered 2022-05-15: .2 mg via INTRAVENOUS

## 2022-05-15 MED ORDER — LIDOCAINE HCL (PF) 1 % IJ SOLN
INTRAMUSCULAR | Status: AC
  Filled 2022-05-15: qty 30

## 2022-05-15 MED ORDER — ORAL CARE MOUTH RINSE
15.0000 mL | Freq: Once | OROMUCOSAL | Status: AC
  Administered 2022-05-15: 15 mL via OROMUCOSAL

## 2022-05-15 MED ORDER — PHENYLEPHRINE 80 MCG/ML (10ML) SYRINGE FOR IV PUSH (FOR BLOOD PRESSURE SUPPORT)
PREFILLED_SYRINGE | INTRAVENOUS | Status: AC
  Filled 2022-05-15: qty 10

## 2022-05-15 MED ORDER — LIDOCAINE HCL (CARDIAC) PF 100 MG/5ML IV SOSY
PREFILLED_SYRINGE | INTRAVENOUS | Status: DC | PRN
  Administered 2022-05-15: 50 mg via INTRAVENOUS

## 2022-05-15 MED ORDER — LIDOCAINE HCL (PF) 1 % IJ SOLN
INTRAMUSCULAR | Status: DC | PRN
  Administered 2022-05-15: 5 mL

## 2022-05-15 MED ORDER — ONDANSETRON HCL 4 MG/2ML IJ SOLN
INTRAMUSCULAR | Status: DC | PRN
  Administered 2022-05-15: 4 mg via INTRAVENOUS

## 2022-05-15 MED ORDER — ACETAMINOPHEN 500 MG PO TABS
1000.0000 mg | ORAL_TABLET | Freq: Once | ORAL | Status: AC
  Administered 2022-05-15: 1000 mg via ORAL
  Filled 2022-05-15: qty 2

## 2022-05-15 MED ORDER — MIDAZOLAM HCL 2 MG/2ML IJ SOLN
INTRAMUSCULAR | Status: AC
  Filled 2022-05-15: qty 2

## 2022-05-15 SURGICAL SUPPLY — 42 items
BAG COUNTER SPONGE SURGICOUNT (BAG) ×1 IMPLANT
CANISTER SUCT 3000ML PPV (MISCELLANEOUS) ×1 IMPLANT
CLIP VESOCCLUDE SM WIDE 6/CT (CLIP) ×1 IMPLANT
CNTNR URN SCR LID CUP LEK RST (MISCELLANEOUS) ×1 IMPLANT
CONT SPEC 4OZ STRL OR WHT (MISCELLANEOUS) ×1
COTTONBALL LRG STERILE PKG (GAUZE/BANDAGES/DRESSINGS) ×1 IMPLANT
COVER SURGICAL LIGHT HANDLE (MISCELLANEOUS) ×1 IMPLANT
DERMABOND ADVANCED .7 DNX12 (GAUZE/BANDAGES/DRESSINGS) ×1 IMPLANT
DERMABOND ADVANCED .7 DNX6 (GAUZE/BANDAGES/DRESSINGS) IMPLANT
DRAPE OPHTHALMIC 77X100 STRL (CUSTOM PROCEDURE TRAY) ×1 IMPLANT
ELECT NDL BLADE 2-5/6 (NEEDLE) ×1 IMPLANT
ELECT NEEDLE BLADE 2-5/6 (NEEDLE) ×1 IMPLANT
ELECT REM PT RETURN 9FT ADLT (ELECTROSURGICAL) ×1
ELECTRODE REM PT RTRN 9FT ADLT (ELECTROSURGICAL) ×1 IMPLANT
GAUZE 4X4 16PLY ~~LOC~~+RFID DBL (SPONGE) ×1 IMPLANT
GEL ULTRASOUND 8.5O AQUASONIC (MISCELLANEOUS) ×1 IMPLANT
GLOVE BIO SURGEON STRL SZ7.5 (GLOVE) ×1 IMPLANT
GLOVE BIOGEL PI IND STRL 8 (GLOVE) ×1 IMPLANT
GOWN STRL REUS W/ TWL LRG LVL3 (GOWN DISPOSABLE) ×1 IMPLANT
GOWN STRL REUS W/ TWL XL LVL3 (GOWN DISPOSABLE) ×1 IMPLANT
GOWN STRL REUS W/TWL LRG LVL3 (GOWN DISPOSABLE) ×1
GOWN STRL REUS W/TWL XL LVL3 (GOWN DISPOSABLE) ×1
KIT BASIN OR (CUSTOM PROCEDURE TRAY) ×1 IMPLANT
KIT TURNOVER KIT B (KITS) ×1 IMPLANT
LOOP VESSEL MINI RED (MISCELLANEOUS) ×1 IMPLANT
NDL HYPO 25GX1X1/2 BEV (NEEDLE) ×1 IMPLANT
NEEDLE HYPO 25GX1X1/2 BEV (NEEDLE) ×1 IMPLANT
NS IRRIG 1000ML POUR BTL (IV SOLUTION) ×1 IMPLANT
PACK GENERAL/GYN (CUSTOM PROCEDURE TRAY) ×1 IMPLANT
PAD ARMBOARD 7.5X6 YLW CONV (MISCELLANEOUS) ×2 IMPLANT
SPIKE FLUID TRANSFER (MISCELLANEOUS) ×1 IMPLANT
SUCTION FRAZIER HANDLE 10FR (MISCELLANEOUS) ×1
SUCTION TUBE FRAZIER 10FR DISP (MISCELLANEOUS) ×1 IMPLANT
SUT MNCRL AB 4-0 PS2 18 (SUTURE) ×1 IMPLANT
SUT PROLENE 6 0 BV (SUTURE) IMPLANT
SUT SILK 3 0 (SUTURE)
SUT SILK 3-0 18XBRD TIE 12 (SUTURE) IMPLANT
SUT VIC AB 3-0 SH 27 (SUTURE) ×1
SUT VIC AB 3-0 SH 27X BRD (SUTURE) ×1 IMPLANT
SYR CONTROL 10ML LL (SYRINGE) ×1 IMPLANT
TOWEL GREEN STERILE (TOWEL DISPOSABLE) ×1 IMPLANT
WATER STERILE IRR 1000ML POUR (IV SOLUTION) ×1 IMPLANT

## 2022-05-15 NOTE — Anesthesia Procedure Notes (Signed)
Procedure Name: LMA Insertion Date/Time: 05/15/2022 7:49 AM  Performed by: Claris Che, CRNAPre-anesthesia Checklist: Patient identified, Emergency Drugs available, Suction available, Patient being monitored and Timeout performed Patient Re-evaluated:Patient Re-evaluated prior to induction Oxygen Delivery Method: Circle system utilized Preoxygenation: Pre-oxygenation with 100% oxygen Induction Type: IV induction Ventilation: Mask ventilation without difficulty LMA: LMA inserted LMA Size: 4.0 Number of attempts: 1 Placement Confirmation: positive ETCO2 and breath sounds checked- equal and bilateral Tube secured with: Tape Dental Injury: Teeth and Oropharynx as per pre-operative assessment

## 2022-05-15 NOTE — Anesthesia Postprocedure Evaluation (Signed)
Anesthesia Post Note  Patient: Tracey [name]  Procedure(s) Performed: BIOPSY TEMPORAL ARTERY (Left)     Patient location during evaluation: PACU Anesthesia Type: General Level of consciousness: sedated Pain management: pain level controlled Vital Signs Assessment: post-procedure vital signs reviewed and stable Respiratory status: spontaneous breathing and respiratory function stable Cardiovascular status: stable Postop Assessment: no apparent nausea or vomiting Anesthetic complications: no   No notable events documented.  Last Vitals:  Vitals:   05/15/22 0900 05/15/22 0915  BP: (!) 142/72 (!) 142/69  Pulse: 60 (!) 57  Resp: 13 12  Temp:  36.4 C  SpO2: 100% 99%    Last Pain:  Vitals:   05/15/22 0845  PainSc: [place]

## 2022-05-15 NOTE — Transfer of Care (Signed)
Immediate Anesthesia Transfer of Care Note  Patient: Tracey [name]  Procedure(s) Performed: BIOPSY TEMPORAL ARTERY (Left)  Patient Location: PACU  Anesthesia Type:General  Level of Consciousness: awake, alert , oriented and patient cooperative  Airway & Oxygen Therapy: Patient Spontanous Breathing  Post-op Assessment: Report given to RN, Post -op Vital signs reviewed and stable and Patient moving all extremities X 4  Post vital signs: Reviewed and stable  Last Vitals:  Vitals Value Taken Time  BP 140/73 05/15/22 0845  Temp    Pulse 63 05/15/22 0846  Resp 17 05/15/22 0846  SpO2 100 % 05/15/22 0846  Vitals shown include unvalidated device data.  Last Pain:  Vitals:   05/15/22 0608  PainSc: 0-No pain      Patients Stated Pain Goal: 0 (77/[id number]/96 [id number])  Complications: No notable events documented.

## 2022-05-15 NOTE — Op Note (Signed)
Date: May 15, 2022  Preoperative diagnosis: Concern for temporal arteritis  Postoperative diagnosis: Same  Procedure: Left temporal artery biopsy  Surgeon: Dr. Marty Heck, MD  Assistant: OR staff  Indications: 76 year old female with history of left temporal headaches with positive ANA seen by Dr. Scot Dock earlier this week in the office.  Vascular surgery was consulted for temporal artery biopsy.  She presents today for temporal artery biopsy on the left after risks benefits discussed.  Findings: Left temporal artery was marked with Doppler probe prior to incision.  Incision was made over the left temporal region.  A 3 cm specimen of temporal artery was biopsied after we confirmed arterial flow by Doppler and this was passed off the field and sent to pathology.  Anesthesia: LMA with local  Details: Patient was taken to the operating room after informed consent was obtained.  She was placed on operative table in supine position.  After anesthesia was induced with an LMA, I then used a pencil Doppler and marked the course of her temporal artery over the left temporal region.  The left temporal region was then prepped and draped in standard sterile fashion.  Antibiotics were given.  Timeout performed.  Initially used 1% lidocaine without epinephrine and injected about 5 cc for local anesthesia.  A 3 cm incision was then made over the left temporal artery.  I used Bovie cautery with retractors and dissected out the temporal artery with Metzenbaum scissors.  Branches were controlled with clips.  Once I had about a 3 cm specimen dissected out it was controlled with right angle clamps proximally and distally in the wound and then the specimen was transected with Metzenbaum scissors and passed off the field.  Each end of the temporal artery stump was then ligated with 3-0 silk ties.  I got hemostasis with Bovie cautery.  I then closed the incision with 3-0 Vicryl 4-0 Monocryl and  Dermabond.  Complication: None  Condition: Stable  Marty Heck, MD [facility] Office: [place]

## 2022-05-15 NOTE — H&P (Signed)
History and Physical Interval Note:  05/15/2022 7:39 AM  Tracey [name]  has presented today for surgery, with the diagnosis of Temporal arteritis.  The various methods of treatment have been discussed with the patient and family. After consideration of risks, benefits and other options for treatment, the patient has consented to  Procedure(s): BIOPSY TEMPORAL ARTERY (Left) as a surgical intervention.  The patient's history has been reviewed, patient examined, no change in status, stable for surgery.  I have reviewed the patient's chart and labs.  Questions were answered to the patient's satisfaction.    Left temporal artery biopsy  Tracey [name]  ASSESSMENT & PLAN    RULE OUT TEMPORAL ARTERITIS: This patient has been having significant temporal headaches on the left and has a positive ANA.  Temporal arteritis is in the differential diagnosis and we have been asked to perform a left temporal artery biopsy.  I have discussed the indications for the procedure and the potential complications and she is agreeable to proceed.  The surgery scheduled for Friday, 05/15/2022 with Dr. Carlis Abbott.  All of her questions were answered and she is agreeable to proceed.   REASON FOR CONSULT:     To evaluate for a temporal artery biopsy.  Consult is requested by Dr. Christinia Gully.    HPI:    Tracey [name] is a 76 y.o. female who has had left temporal headaches off and on for several months now.  She had a positive ANA and temporal arteritis is in the differential diagnosis.  For this reason she was sent for a left temporal artery biopsy.  She denies any visual disturbance.   Patient had a knee replacement on the left and had been having some intermittent infections.  This prompted an extensive work-up and ultimately she was found to have a positive ANA.   She is on aspirin and is on Plavix.  She is undergone previous PTCA.  Her cardiologist is Dr. Sherren Mocha.       Past Medical History:   Diagnosis Date   Allergy     Anxiety      takes Ativan daily prn   Arthritis     Breast cancer ([place]) 2014    ER+/PR+/HEr2-,    Bruises easily     Colon polyps      2 polyps by report   Coronary artery disease involving native coronary artery of native heart without angina pectoris 01/01/2014    NSTEMI in January 2019 in [place]>> s/p DES x2 to the LAD (procedure complicated by stent thrombosis) Myoview 05/2020: EF 72, no infarct or ischemia; low risk Echocardiogram 05/2019: EF 60-65, GLS -21.5 Cardiac catheterization at [facility] in 08/2017: Normal left main, normal LCx, normal RCA; LAD/diagonal 95%>> PCI    Diverticulosis     GERD (gastroesophageal reflux disease)      occasionally takes Nexium    Gout      takes Allopurinol daily and Colchicine daily prn;last attack 57yr ago   Gout     History of bladder infections      many yrs ago   History of bronchitis      last time at least 831yrago   History of stress incontinence     Hyperlipidemia      takes Pravastatin daily   Hypertension     Insomnia      takes Ambien nightly prn   NSTEMI (non-ST elevated myocardial infarction) ([facility]/2019   OSA (obstructive sleep apnea)  on CPAP   Osteoarthritis     Peripheral edema      takes Furosemide daily prn   PONV (postoperative nausea and vomiting)     Postmenopausal hormone therapy     Radiation 07/27/13-09/07/13    Right Breast x 31 treatments   Rhinitis      uses Flonase prn   Sinus congestion     Status post breast reconstruction 10/15/2014    Bilateral implant removal and DIEP performed in [place], [place]   Tachycardia      takes Metoprolol daily   Ventricular tachycardia, non-sustained (HCC)      during sleep study 2009 with normal cardiac workup           Family History  Problem Relation Age of Onset   Breast cancer Mother          possible inflammatory breast cancer   Heart attack Father     Heart disease Father     ALS Sister     Breast  cancer Maternal Grandmother 73   Heart attack Maternal Grandfather     Heart attack Paternal Grandfather     Breast cancer Maternal Aunt          dx in her 15s   Breast cancer Other          maternal great grandmother; dx in her 53s      SOCIAL HISTORY: Social History         Tobacco Use   Smoking status: Former      Packs/day: 1.00      Years: 20.00      Total pack years: 20.00      Types: Cigarettes      Start date: 06/09/1992   Smokeless tobacco: Never   Tobacco comments:      quit at age 13  Substance Use Topics   Alcohol use: Yes      Alcohol/week: 7.0 - 14.0 standard drinks of alcohol      Types: 7 - 14 Glasses of wine per week      Comment: daily           Allergies  Allergen Reactions   Pseudoeph-Hydrocodone-Gg Other (See Comments)   Ivp Dye [Iodinated Contrast Media] Hives   Oxycodone Other (See Comments)      hallucinations   Sulfa Antibiotics Swelling   Tape Other (See Comments)      Unknown; tears skin ; ok to use paper tape             Current Outpatient Medications  Medication Sig Dispense Refill   allopurinol (ZYLOPRIM) 100 MG tablet TAKE 1 TABLET BY MOUTH ONCE DAILY (Patient taking differently: Take 100 mg by mouth daily.) 90 tablet 0   ASPIRIN LOW DOSE 81 MG chewable tablet Chew 81 mg by mouth daily.    0   Cholecalciferol (VITAMIN D3) 50 MCG (2000 UT) TABS Take 2,000 Units by mouth daily.       clopidogrel (PLAVIX) 75 MG tablet Take 1 tablet (75 mg total) by mouth daily. 90 tablet 3   cyanocobalamin (,VITAMIN B-12,) 1000 MCG/ML injection Inject 1,000 mcg into the muscle every 30 (thirty) days.       Doxylamine-DM & DM (VICKS DAYQUIL/NYQUIL COUGH) 6.25-15 & 15 MG/15ML LQPK Take 15 mLs by mouth every 6 (six) hours as needed (allergy symptoms).       ezetimibe (ZETIA) 10 MG tablet TAKE 1 TABLET(10 MG) BY MOUTH DAILY (Patient taking differently: Take 10 mg by mouth  daily.) 90 tablet 2   fluticasone (FLONASE) 50 MCG/ACT nasal spray Place 1 spray into  both nostrils daily.       furosemide (LASIX) 40 MG tablet Take 40 mg by mouth daily as needed for fluid.        loratadine (CLARITIN) 10 MG tablet Take 10 mg by mouth daily.       methocarbamol (ROBAXIN) 500 MG tablet Take 1 tablet (500 mg total) by mouth every 8 (eight) hours as needed for muscle spasms. 10 tablet 0   metoprolol succinate (TOPROL-XL) 25 MG 24 hr tablet TAKE 1 TABLET(25 MG) BY MOUTH DAILY (Patient taking differently: Take 25 mg by mouth daily.) 90 tablet 3   Multiple Vitamin (MULTI-VITAMIN) tablet Take 1 tablet by mouth daily.       nitroGLYCERIN (NITROSTAT) 0.4 MG SL tablet Place 1 tablet (0.4 mg total) under the tongue every 5 (five) minutes as needed for chest pain. 25 tablet 3   NONFORMULARY OR COMPOUNDED ITEM Vitamin E vaginal cream 200u/ml.  One ml pv three times weekly. 36 each 1   pantoprazole (PROTONIX) 40 MG tablet TAKE 1 TABLET(40 MG) BY MOUTH DAILY (Patient taking differently: Take 40 mg by mouth daily.) 90 tablet 3   polyethylene glycol (MIRALAX / GLYCOLAX) 17 g packet Take 17 g by mouth daily as needed for mild constipation. 14 each 0   rosuvastatin (CRESTOR) 20 MG tablet Take 1 tablet (20 mg total) by mouth daily. 90 tablet 3   sertraline (ZOLOFT) 100 MG tablet Take 100 mg by mouth daily.       testosterone cypionate (DEPOTESTOSTERONE CYPIONATE) 200 MG/ML injection Inject 200 mg into the muscle See admin instructions. Inject 274m into the muscle every 45 days        No current facility-administered medications for this visit.      REVIEW OF SYSTEMS:  [X]  denotes positive finding, [ ]  denotes negative finding Cardiac   Comments:  Chest pain or chest pressure:      Shortness of breath upon exertion:      Short of breath when lying flat:      Irregular heart rhythm:             Vascular      Pain in calf, thigh, or hip brought on by ambulation:      Pain in feet at night that wakes you up from your sleep:       Blood clot in your veins:      Leg swelling:               Pulmonary      Oxygen at home:      Productive cough:       Wheezing:              Neurologic      Sudden weakness in arms or legs:       Sudden numbness in arms or legs:       Sudden onset of difficulty speaking or slurred speech:      Temporary loss of vision in one eye:       Problems with dizziness:              Gastrointestinal      Blood in stool:       Vomited blood:              Genitourinary      Burning when urinating:       Blood in  urine:             Psychiatric      Major depression:              Hematologic      Bleeding problems:      Problems with blood clotting too easily:             Skin      Rashes or ulcers:             Constitutional      Fever or chills: x    -   PHYSICAL EXAM:       Vitals:    05/13/22 1021  BP: 122/71  Pulse: 65  Resp: 14  Temp: 98 F (36.7 C)  TempSrc: Temporal  SpO2: 99%  Weight: 144 lb (65.3 kg)  Height: 5' 2"  (1.575 m)    Body mass index is 26.34 kg/m. GENERAL: The patient is a well-nourished female, in no acute distress. The vital signs are documented above. CARDIAC: There is a regular rate and rhythm.  VASCULAR: I do not detect carotid bruits. I could not palpate a temporal artery pulse on the left however she has a good Doppler signal over the temporal artery just anterior to the superior aspect of the left ear.  She does have a scar adjacent to this area where she had plastic surgery 25 years ago. PULMONARY: There is good air exchange bilaterally without wheezing or rales.  MUSCULOSKELETAL: There are no major deformities. NEUROLOGIC: No focal weakness or paresthesias are detected. SKIN: There are no ulcers or rashes noted. PSYCHIATRIC: The patient has a normal affect.   DATA:     LABS: Her antinuclear antibody was positive on 01/28/2022.   Sed rate on 01/28/2022 was 14.   Deitra Mayo [facility]

## 2022-05-16 ENCOUNTER — Encounter (HOSPITAL_COMMUNITY): Payer: Self-pay | Admitting: Vascular Surgery

## 2022-05-18 ENCOUNTER — Telehealth: Payer: Self-pay | Admitting: Vascular Surgery

## 2022-05-18 LAB — SURGICAL PATHOLOGY

## 2022-05-18 NOTE — Telephone Encounter (Signed)
Called and informed patient via voicemail and also spoke with her husband about temporal artery biopsy results being negative for temporal arteritis.  She has follow-up with her rheumatologist.  I did send a copy of the results to her rheumatologist Dr. Carlene Coria.  Marty Heck, MD [facility] [place] Office: [place]

## 2022-05-27 DIAGNOSIS — R519 Headache, unspecified: Secondary | ICD-10-CM | POA: Diagnosis not present

## 2022-05-27 DIAGNOSIS — Z6826 Body mass index (BMI) 26.0-26.9, adult: Secondary | ICD-10-CM | POA: Diagnosis not present

## 2022-05-27 DIAGNOSIS — M79642 Pain in left hand: Secondary | ICD-10-CM | POA: Diagnosis not present

## 2022-05-27 DIAGNOSIS — M2540 Effusion, unspecified joint: Secondary | ICD-10-CM | POA: Diagnosis not present

## 2022-05-27 DIAGNOSIS — R0602 Shortness of breath: Secondary | ICD-10-CM | POA: Diagnosis not present

## 2022-05-27 DIAGNOSIS — M79641 Pain in right hand: Secondary | ICD-10-CM | POA: Diagnosis not present

## 2022-05-27 DIAGNOSIS — R6884 Jaw pain: Secondary | ICD-10-CM | POA: Diagnosis not present

## 2022-05-27 DIAGNOSIS — R768 Other specified abnormal immunological findings in serum: Secondary | ICD-10-CM | POA: Diagnosis not present

## 2022-05-27 DIAGNOSIS — E663 Overweight: Secondary | ICD-10-CM | POA: Diagnosis not present

## 2022-05-27 DIAGNOSIS — M2560 Stiffness of unspecified joint, not elsewhere classified: Secondary | ICD-10-CM | POA: Diagnosis not present

## 2022-05-28 DIAGNOSIS — Z96652 Presence of left artificial knee joint: Secondary | ICD-10-CM | POA: Diagnosis not present

## 2022-05-28 DIAGNOSIS — M25562 Pain in left knee: Secondary | ICD-10-CM | POA: Diagnosis not present

## 2022-06-03 ENCOUNTER — Other Ambulatory Visit (HOSPITAL_BASED_OUTPATIENT_CLINIC_OR_DEPARTMENT_OTHER): Payer: Self-pay

## 2022-06-03 DIAGNOSIS — I25118 Atherosclerotic heart disease of native coronary artery with other forms of angina pectoris: Secondary | ICD-10-CM

## 2022-06-03 MED ORDER — CLOPIDOGREL BISULFATE 75 MG PO TABS: 75.0000 mg | ORAL_TABLET | Freq: Every day | ORAL | 1 refills | Status: DC

## 2022-06-03 NOTE — Telephone Encounter (Signed)
Received fax from [facility] requesting refills for Clopidogrel 75 mg.  Pt of Dr. Burt Knack. Please review for refill. Thank you!

## 2022-06-09 NOTE — Progress Notes (Addendum)
Cardiology Office Note:    Date:  06/10/2022   ID:  Tracey [name], Tracey [name] [birth year], MRN [id number]  PCP:  [name], [place] [facility] Cardiologist:  Sherren Mocha, MD Cardiology APP:  Sharmon Revere     Referring MD: [name], MD   Chief Complaint:  F/u CAD    Patient Profile: Coronary artery disease  NSTEMI in Jan 2019 (in [place], [facility]) s/p DES to LAD C/b stent thrombosis - required 2nd stent Intol to higher dose beta-blocker due to fatigue Hyperlipidemia  Hypertension  Chronic kidney disease  Sleep apnea  Aortic atherosclerosis  Breast CA s/p double mastectomy, radiation  Prior CV Studies:   Myoview 05/20/2020 EF 72, no infarct or ischemia, low risk   Myoview 05/30/2019 EF 67, normal perfusion; low risk   Echocardiogram 05/30/2019 EF 60-65, normal RVSF, trivial TR, GLS -21.5   Nuclear stress test [facility] 11/01/2017 EF 81, no ischemia, submaximal heart rate (76% predicted maximal heart rate)   Echocardiogram 09/15/2017 [facility] EF 60, anteroseptal and apical hypokinesis, mild LVH   Cardiac catheterization 09/16/2017 [facility] LM normal LAD/diagonal 95% LCx normal RCA normal  History of Present Illness:   Tracey [name] is a 76 y.o. female with the above problem list.  She was last seen in April 2023. She returns for f/u.    She is here with her daughter.  She has had significant issues with fatigue over the past year or so.  She was under the impression it was related to her anemia and thought things would improve after her knee surgery.  However, they did not.  She has had low-grade fevers off and on.  She has not had any temperature above 100.  She has had night sweats.  She has not had significant weight loss.  She had worsening creatinine and was actually admitted to the hospital in May.  She was noted to have esophageal stricture on testing and underwent  esophageal dilation.  She did have an abdominal pelvic as well as chest CT.  Of note, no adenopathy or tumors were identified.  She has been to see infectious disease.  She had an extensive work-up and the only abnormality was an abnormal ANA.  She was referred to rheumatology.  Her daughter notes that there is no specific diagnosis but they think that she should start on hydroxychloroquine, as long as it is okay with cardiology.  She was also placed on prednisone with some improvement in her symptoms initially.  She is being tapered off at this time.  She did undergo temporal artery biopsy recently which was negative.     She has had chest discomfort that she describes as indigestion.  This is nonexertional.  She has felt more of it since her esophageal dilation.  She has chronic shortness of breath with exertion.  She goes to [place] for part of the year, every year.  While she is in [place], she has to use oxygen due to worsening shortness of breath.  This has been ongoing for few years now.  She has not had orthopnea.  She uses CPAP at night for sleep apnea.  She has not had syncope.    Past Medical History:  Diagnosis Date   Allergy    Anxiety    takes Ativan daily prn   Arthritis    Breast cancer ([place]) 2014   ER+/PR+/HEr2-,    Bruises easily  Colon polyps    2 polyps by report   Coronary artery disease involving native coronary artery of native heart without angina pectoris 01/01/2014   NSTEMI in January 2019 in [place]>> s/p DES x2 to the LAD (procedure complicated by stent thrombosis) Myoview 05/2020: EF 72, no infarct or ischemia; low risk Echocardiogram 05/2019: EF 60-65, GLS -21.5 Cardiac catheterization at [facility] in 08/2017: Normal left main, normal LCx, normal RCA; LAD/diagonal 95%>> PCI    Diverticulosis    GERD (gastroesophageal reflux disease)    occasionally takes Nexium    Gout    Headache    MIgraines- no headaches- has floater in eyes   History  of bladder infections    many yrs ago   History of bronchitis    last time at least 76yr ago   History of hiatal hernia    History of stress incontinence    Hyperlipidemia    takes Pravastatin daily   Hypertension    Insomnia    takes Ambien nightly prn   [place]   OSA (obstructive sleep apnea)    on CPAP   Osteoarthritis    Peripheral edema    takes Furosemide daily prn   PONV (postoperative nausea and vomiting)    Postmenopausal hormone therapy    Radiation 07/27/13-09/07/13   Right Breast x 31 treatments   Rhinitis    uses Flonase prn   Sinus congestion    Status post breast reconstruction 10/15/2014   Bilateral implant removal and DIEP performed in [place], [place]   Tachycardia    takes Metoprolol daily   [place]ent[place], [place]sust[place]    during [place] study 2009 with normal cardiac workup   Current Medications: Current Meds  Medication Sig   allopurinol (ZYLOPRIM) 100 MG tablet TAKE 1 TABLET BY MOUTH ONCE DAILY   ASPIRIN LOW DOSE 81 MG chewable tablet Chew 81 mg by mouth daily.    Cholecalciferol (VITAMIN D3) 50 MCG (2000 UT) TABS Take 2,000 Units by mouth daily.   clopidogrel (PLAVIX) 75 MG tablet Take 1 tablet (75 mg total) by mouth daily.   cyanocobalamin (,VITAMIN B-12,) 1000 MCG/ML injection Inject 1,000 mcg into the muscle every 30 (thirty) days.   Doxylamine-DM & DM (VICKS DAYQUIL/NYQUIL COUGH) 6.25-15 & 15 MG/15ML LQPK Take 15 mLs by mouth every 6 (six) hours as needed (allergy symptoms).   ezetimibe (ZETIA) 10 MG tablet TAKE 1 TABLET(10 MG) BY MOUTH DAILY   fluticasone (FLONASE) 50 MCG/ACT nasal spray Place 1 spray into both nostrils daily.   furosemide (LASIX) 40 MG tablet Take 40 mg by mouth daily as needed for fluid.    loratadine (CLARITIN) 10 MG tablet Take 10 mg by mouth daily.   methocarbamol (ROBAXIN) 500 MG tablet Take 1 tablet (500 mg total) by mouth every 8 (eight) hours as needed for  muscle spasms.   metoprolol succinate (TOPROL-XL) 25 MG 24 hr tablet TAKE 1 TABLET(25 MG) BY MOUTH DAILY   Multiple Vitamin (MULTI-VITAMIN) tablet Take 1 tablet by mouth daily.   Multiple Vitamins-Minerals (HAIR/SKIN/NAILS) CAPS Take 1 tablet by mouth daily.   nitroGLYCERIN (NITROSTAT) 0.4 MG SL tablet Place 1 tablet (0.4 mg total) under the tongue every 5 (five) minutes as needed for chest pain.   NONFORMULARY OR COMPOUNDED ITEM Vitamin E vaginal cream 200u/ml.  One ml pv three times weekly.   pantoprazole (PROTONIX) 40 MG tablet TAKE 1 TABLET(40 MG) BY MOUTH DAILY   polyethylene glycol (  MIRALAX / GLYCOLAX) 17 g packet Take 17 g by mouth daily as needed for mild constipation.   predniSONE (DELTASONE) 10 MG tablet Take 10 mg by mouth 2 (two) times daily.   Propylene Glycol (SYSTANE COMPLETE) 0.6 % SOLN Place 1 drop into both eyes 2 (two) times daily.   rosuvastatin (CRESTOR) 20 MG tablet Take 1 tablet (20 mg total) by mouth daily.   sertraline (ZOLOFT) 100 MG tablet Take 100 mg by mouth daily.   testosterone cypionate (DEPOTESTOSTERONE CYPIONATE) 200 MG/ML injection Inject 200 mg into the muscle every 30 (thirty) days.   traMADol (ULTRAM) 50 MG tablet Take 1 tablet (50 mg total) by mouth every 6 (six) hours as needed for moderate pain.    Allergies:   Pseudoeph-hydrocodone-gg, Ivp dye [iodinated contrast media], Oxycodone, Sulfa antibiotics, and Tape   Social History   Tobacco Use   Smoking status: Former    Packs/day: 1.00    Years: 20.00    Total pack years: 20.00    Types: Cigarettes    Start date: 06/09/1992   Smokeless tobacco: Never   Tobacco comments:    quit at age 44  Vaping Use   Vaping Use: Never used  Substance Use Topics   Alcohol use: Not Currently    Alcohol/week: 1.0 standard drink of alcohol    Types: 1 Standard drinks or equivalent per week   Drug use: Not Currently    Types: Other-see comments    Family Hx: The patient's family history includes ALS in her  sister; Breast cancer in her maternal aunt, mother, and another family member; Breast cancer (age of onset: 71) in her maternal grandmother; Heart attack in her father, maternal grandfather, and paternal grandfather; Heart disease in her father.  Review of Systems  Gastrointestinal:  Negative for hematochezia.  Genitourinary:  Negative for hematuria.  See HPI  EKGs/Labs/Other Test Reviewed:    EKG:  EKG is  ordered today.  The ekg ordered today demonstrates NSR, HR 88, normal axis, no ST-T wave changes, QTc 401  Recent Labs: 12/29/2021: B Natriuretic Peptide 32.1 12/31/2021: Magnesium 1.7 01/28/2022: ALT 14 05/15/2022: BUN 37; Creatinine, Ser 1.37; Hemoglobin 14.1; Platelets 189; Potassium 5.1; Sodium 139   Recent Lipid Panel No results for input(s): "CHOL", "TRIG", "HDL", "VLDL", "LDLCALC", "LDLDIRECT" in the last 8760 hours.   Risk Assessment/Calculations/Metrics:              Physical Exam:    VS:  BP 130/62   Pulse 88   Ht 5' 2"  (1.575 m)   Wt 146 lb 3.2 oz (66.3 kg)   LMP 08/17/1977 (Approximate)   SpO2 95%   BMI 26.74 kg/m     Wt Readings from Last 3 Encounters:  06/10/22 146 lb 3.2 oz (66.3 kg)  05/15/22 142 lb (64.4 kg)  05/13/22 144 lb (65.3 kg)    Constitutional:      Appearance: Healthy appearance. Not in distress.  Neck:     Vascular: JVD normal.  Pulmonary:     Effort: Pulmonary effort is normal.     Breath sounds: No wheezing. No rales.  Cardiovascular:     Normal rate. Regular rhythm. Normal S1. Normal S2.      Murmurs: There is no murmur.  Edema:    Peripheral edema absent.  Abdominal:     Palpations: Abdomen is soft.  Skin:    General: Skin is warm and dry.  Neurological:     Mental Status: Alert and oriented to person, place  and time.         ASSESSMENT & PLAN:   Coronary artery disease involving native coronary artery with angina pectoris [facility]) History of non-STEMI in January 2019 while in [place] treated with DES to the LAD.   Procedure was complicated by stent thrombosis requiring a second stent.  She has remained on dual antiplatelet therapy with aspirin and Plavix.  Her last Myoview in 2021 was low risk.  She has had chest discomfort off and on that she attributes to acid reflux.  She notes worsening symptoms since esophageal dilation earlier this year.  She also notes shortness of breath with exertion.  This is fairly chronic.  She notes worsening symptoms when she is in [place] and has to wear her oxygen.  She does have a referral pending to pulmonology. Continue aspirin 81 mg daily, Plavix 75 mg daily, Zetia 10 mg daily, Crestor 20 mg daily, Toprol-XL 25 mg daily Lexiscan Myoview 2D echocardiogram Follow-up in 6 months or sooner if testing abnormal  DOE (dyspnea on exertion) Proceed with stress testing and echocardiogram as noted.  She does have a referral to pulmonology pending.  Fever of unknown origin She has had a low-grade fever for quite some time.  She has had a fairly negative work-up except for an abnormal ANA.  She has seen rheumatology and it has been suggested that she start on hydroxychloroquine.  She was asked to clear this with cardiology first.  Her QT is not prolonged on EKG today.  As long as her echocardiogram and stress test do not demonstrate any significant abnormalities, I do not think there are any contraindications to taking hydroxychloroquine from a cardiac standpoint.  Stage 3b chronic kidney disease (HCC) Overall, creatinine has remained fairly stable.  It may be worthwhile to see nephrology at some point to follow her on long-term basis.  Essential hypertension Blood pressure is well controlled.  Continue Toprol-XL 25 mg daily.  Hyperlipidemia LDL goal <70 Labs from [facility] personally reviewed and interpreted. Labs from 03/09/22: Total cholesterol 117, HDL 68, LDL 30, triglycerides 97, ALT 21.  Lipids optimal.  Continue Zetia 10 mg daily Crestor 20 mg daily.  OSA on CPAP She remains on  CPAP.        Shared Decision Making/Informed Consent The risks [chest pain, shortness of breath, cardiac arrhythmias, dizziness, blood pressure fluctuations, myocardial infarction, stroke/transient ischemic attack, nausea, vomiting, allergic reaction, radiation exposure, metallic taste sensation and life-threatening complications (estimated to be 1 in 10,000)], benefits (risk stratification, diagnosing coronary artery disease, treatment guidance) and alternatives of a nuclear stress test were discussed in detail with Ms. Blackwelder and she agrees to proceed.   Dispo:  Return in about 6 months (around 12/10/2022) for Routine Follow Up, w/ Dr. Burt Knack.   Medication Adjustments/Labs and Tests Ordered: Current medicines are reviewed at length with the patient today.  Concerns regarding medicines are outlined above.  Tests Ordered: Orders Placed This Encounter  Procedures   MYOCARDIAL PERFUSION IMAGING   EKG 12-Lead   ECHOCARDIOGRAM COMPLETE   Medication Changes: No orders of the defined types were placed in this encounter.  Signed, Richardson Dopp, PA-C  06/10/2022 12:33 PM    [address], [place], [place]  [address] Phone: [phone]; Fax: [phone]

## 2022-06-10 ENCOUNTER — Ambulatory Visit: Payer: Medicare Other | Attending: Physician Assistant | Admitting: Physician Assistant

## 2022-06-10 ENCOUNTER — Encounter: Payer: Self-pay | Admitting: Physician Assistant

## 2022-06-10 VITALS — BP 130/62 | HR 88 | Ht 62.0 in | Wt 146.2 lb

## 2022-06-10 DIAGNOSIS — N1832 Chronic kidney disease, stage 3b: Secondary | ICD-10-CM | POA: Diagnosis not present

## 2022-06-10 DIAGNOSIS — R0602 Shortness of breath: Secondary | ICD-10-CM | POA: Diagnosis not present

## 2022-06-10 DIAGNOSIS — G4733 Obstructive sleep apnea (adult) (pediatric): Secondary | ICD-10-CM | POA: Diagnosis not present

## 2022-06-10 DIAGNOSIS — R079 Chest pain, unspecified: Secondary | ICD-10-CM | POA: Insufficient documentation

## 2022-06-10 DIAGNOSIS — R509 Fever, unspecified: Secondary | ICD-10-CM | POA: Diagnosis not present

## 2022-06-10 DIAGNOSIS — I25119 Atherosclerotic heart disease of native coronary artery with unspecified angina pectoris: Secondary | ICD-10-CM | POA: Diagnosis not present

## 2022-06-10 DIAGNOSIS — I10 Essential (primary) hypertension: Secondary | ICD-10-CM | POA: Insufficient documentation

## 2022-06-10 DIAGNOSIS — I2510 Atherosclerotic heart disease of native coronary artery without angina pectoris: Secondary | ICD-10-CM

## 2022-06-10 DIAGNOSIS — E785 Hyperlipidemia, unspecified: Secondary | ICD-10-CM | POA: Diagnosis not present

## 2022-06-10 NOTE — Assessment & Plan Note (Signed)
She has had a low-grade fever for quite some time.  She has had a fairly negative work-up except for an abnormal ANA.  She has seen rheumatology and it has been suggested that she start on hydroxychloroquine.  She was asked to clear this with cardiology first.  Her QT is not prolonged on EKG today.  As long as her echocardiogram and stress test do not demonstrate any significant abnormalities, I do not think there are any contraindications to taking hydroxychloroquine from a cardiac standpoint.

## 2022-06-10 NOTE — Patient Instructions (Signed)
Medication Instructions:  Your physician recommends that you continue on your current medications as directed. Please refer to the Current Medication list given to you today.  *If you need a refill on your cardiac medications before your next appointment, please call your pharmacy*   Lab Work: None ordered  If you have labs (blood work) drawn today and your tests are completely normal, you will receive your results only by: [place] (if you have MyChart) OR A paper copy in the mail If you have any lab test that is abnormal or we need to change your treatment, we will call you to review the results.   Testing/Procedures: Your physician has requested that you have an echocardiogram. Echocardiography is a painless test that uses sound waves to create images of your heart. It provides your doctor with information about the size and shape of your heart and how well your heart's chambers and valves are working. This procedure takes approximately one hour. There are no restrictions for this procedure. Please do NOT wear cologne, perfume, aftershave, or lotions (deodorant is allowed). Please arrive 15 minutes prior to your appointment time.   Your physician has requested that you have a lexiscan myoview. For further information please visit [email or web address]. Please follow instruction sheet, BELOW:    You are scheduled for a Myocardial Perfusion Imaging Study  Please arrive 15 minutes prior to your appointment time for registration and insurance purposes.  The test will take approximately 3 to 4 hours to complete; you may bring reading material.  If someone comes with you to your appointment, they will need to remain in the main lobby due to limited space in the testing area. **If you are pregnant or breastfeeding, please notify the nuclear lab prior to your appointment**  How to prepare for your Myocardial Perfusion Test: Do not eat or drink 3 hours prior to your test, except you may  have water. Do not consume products containing caffeine (regular or decaffeinated) 12 hours prior to your test. (ex: coffee, chocolate, sodas, tea). Do bring a list of your current medications with you.  If not listed below, you may take your medications as normal. Do wear comfortable clothes (no dresses or overalls) and walking shoes, tennis shoes preferred (No heels or open toe shoes are allowed). Do NOT wear cologne, perfume, aftershave, or lotions (deodorant is allowed). If these instructions are not followed, your test will have to be rescheduled.     Follow-Up: At [facility], you and your health needs are our priority.  As part of our continuing mission to provide you with exceptional heart care, we have created designated Provider Care Teams.  These Care Teams include your primary Cardiologist (physician) and Advanced Practice Providers (APPs -  Physician Assistants and Nurse Practitioners) who all work together to provide you with the care you need, when you need it.  We recommend signing up for the patient portal called "MyChart".  Sign up information is provided on this After Visit Summary.  MyChart is used to connect with patients for Virtual Visits (Telemedicine).  Patients are able to view lab/test results, encounter notes, upcoming appointments, etc.  Non-urgent messages can be sent to your provider as well.   To learn more about what you can do with MyChart, go to [email or web address].    Your next appointment:   6 month(s)  The format for your next appointment:   In Person  Provider:   Sherren Mocha, MD  or Richardson Dopp, PA-C  Other Instructions   Important Information About Sugar

## 2022-06-10 NOTE — Assessment & Plan Note (Signed)
Blood pressure is well controlled.  Continue Toprol-XL 25 mg daily.

## 2022-06-10 NOTE — Assessment & Plan Note (Signed)
Overall, creatinine has remained fairly stable.  It may be worthwhile to see nephrology at some point to follow her on long-term basis.

## 2022-06-10 NOTE — Assessment & Plan Note (Signed)
Labs from [facility] personally reviewed and interpreted. Labs from 03/09/22: Total cholesterol 117, HDL 68, LDL 30, triglycerides 97, ALT 21.  Lipids optimal.  Continue Zetia 10 mg daily Crestor 20 mg daily.

## 2022-06-10 NOTE — Assessment & Plan Note (Signed)
She remains on CPAP.

## 2022-06-10 NOTE — Assessment & Plan Note (Addendum)
History of non-STEMI in January 2019 while in [place] treated with DES to the LAD.  Procedure was complicated by stent thrombosis requiring a second stent.  She has remained on dual antiplatelet therapy with aspirin and Plavix.  Her last Myoview in 2021 was low risk.  She has had chest discomfort off and on that she attributes to acid reflux.  She notes worsening symptoms since esophageal dilation earlier this year.  She also notes shortness of breath with exertion.  This is fairly chronic.  She notes worsening symptoms when she is in [place] and has to wear her oxygen.  She does have a referral pending to pulmonology. Continue aspirin 81 mg daily, Plavix 75 mg daily, Zetia 10 mg daily, Crestor 20 mg daily, Toprol-XL 25 mg daily Lexiscan Myoview 2D echocardiogram Follow-up in 6 months or sooner if testing abnormal

## 2022-06-10 NOTE — Assessment & Plan Note (Signed)
Proceed with stress testing and echocardiogram as noted.  She does have a referral to pulmonology pending.

## 2022-06-14 ENCOUNTER — Other Ambulatory Visit: Payer: Self-pay | Admitting: Cardiovascular Disease

## 2022-06-16 ENCOUNTER — Other Ambulatory Visit: Payer: Self-pay | Admitting: *Deleted

## 2022-06-16 MED ORDER — METOPROLOL SUCCINATE ER 25 MG PO TB24: ORAL_TABLET | ORAL | 3 refills | Status: DC

## 2022-06-19 ENCOUNTER — Ambulatory Visit (INDEPENDENT_AMBULATORY_CARE_PROVIDER_SITE_OTHER): Payer: Medicare Other | Admitting: Internal Medicine

## 2022-06-19 ENCOUNTER — Encounter: Payer: Self-pay | Admitting: Internal Medicine

## 2022-06-19 ENCOUNTER — Ambulatory Visit (INDEPENDENT_AMBULATORY_CARE_PROVIDER_SITE_OTHER): Payer: Medicare Other

## 2022-06-19 VITALS — BP 121/60 | HR 89 | Temp 98.4°F | Ht 62.0 in | Wt 147.6 lb

## 2022-06-19 DIAGNOSIS — R0609 Other forms of dyspnea: Secondary | ICD-10-CM

## 2022-06-19 DIAGNOSIS — I25119 Atherosclerotic heart disease of native coronary artery with unspecified angina pectoris: Secondary | ICD-10-CM | POA: Diagnosis not present

## 2022-06-19 DIAGNOSIS — R911 Solitary pulmonary nodule: Secondary | ICD-10-CM | POA: Diagnosis not present

## 2022-06-19 DIAGNOSIS — R0600 Dyspnea, unspecified: Secondary | ICD-10-CM | POA: Diagnosis not present

## 2022-06-19 DIAGNOSIS — J9811 Atelectasis: Secondary | ICD-10-CM | POA: Diagnosis not present

## 2022-06-19 LAB — SPIROMETRY WITH GRAPH

## 2022-06-19 MED ORDER — BREZTRI AEROSPHERE 160-9-4.8 MCG/ACT IN AERO: 2.0000 | INHALATION_SPRAY | Freq: Two times a day (BID) | RESPIRATORY_TRACT | 0 refills | Status: AC

## 2022-06-19 MED ORDER — BUDESONIDE-FORMOTEROL FUMARATE 160-4.5 MCG/ACT IN AERO: INHALATION_SPRAY | RESPIRATORY_TRACT | 12 refills | Status: DC

## 2022-06-19 NOTE — Patient Instructions (Addendum)
I will call report of cxr and recommendations   For breathing difficulty, be sure to check your 02 saturations and increase flow if needed to keep over 90%  Ok to try breztri (or symbicort 160) up to 2 puffs first thing in am and then another 2 puffs about 12 hours later if it helps your sense of breathlessness which may be worse off prednisone

## 2022-06-19 NOTE — Progress Notes (Unsigned)
Tracey [name], female    DOB: [birth year]   MRN: [id number]   Brief patient profile:  23  yowf quit smoking age 76  referred to pulmonary clinic 06/19/2022 by for doe onset p L TKR 12/02/2021 along with FUO  but had required 02 on trips to [facility] (8k)     History of Present Illness  06/19/2022  Pulmonary/ 1st office eval/Salma Walrond  weaning prednisone down to 5 mg  Chief Complaint  Patient presents with   Consult    Pt states that her Rheumatologist sent her here. Pt states she is having SOB with exertion. Pt states that they go to [place] from December til April and she uses Oxygen in [place] but not in [place]. Pt states she is having unknown low grade fevers daily.  Dyspnea:  6 months of doe in [place] and 5 years of needing 02  and MI in [place] 2019 x 2 stents  05/30/2019  -  very inactive p kness  Cough: increased cough / congestion x 6  months throat congestion  Sleep: cpap dohmeier  SABA use: none Fever / soaking sweats variable since  knee surgery and still having pain but neg w/u for infection and rheum w/u also neg with subjective improvement in "everything" but back now to 5 mg daily     Past Medical History:  Diagnosis Date   Allergy    Anxiety    takes Ativan daily prn   Arthritis    Breast cancer ([place]) 2014   ER+/PR+/HEr2-,    Bruises easily    Colon polyps    2 polyps by report   Coronary artery disease involving native coronary artery of native heart without angina pectoris 01/01/2014   NSTEMI in January 2019 in [place]>> s/p DES x2 to the LAD (procedure complicated by stent thrombosis) Myoview 05/2020: EF 72, no infarct or ischemia; low risk Echocardiogram 05/2019: EF 60-65, GLS -21.5 Cardiac catheterization at [facility] in 08/2017: Normal left main, normal LCx, normal RCA; LAD/diagonal 95%>> PCI    Diverticulosis    GERD (gastroesophageal reflux disease)    occasionally takes Nexium    Gout    Headache    MIgraines- no headaches-  has floater in eyes   History of bladder infections    many yrs ago   History of bronchitis    last time at least 66yr ago   History of hiatal hernia    History of stress incontinence    Hyperlipidemia    takes Pravastatin daily   Hypertension    Insomnia    takes Ambien nightly prn   NSTEMI (non-ST elevated myocardial infarction) ([facility] 08/2017   OSA (obstructive sleep apnea)    on CPAP   Osteoarthritis    Peripheral edema    takes Furosemide daily prn   PONV (postoperative nausea and vomiting)    Postmenopausal hormone therapy    Radiation 07/27/13-09/07/13   Right Breast x 31 treatments   Rhinitis    uses Flonase prn   Sinus congestion    Status post breast reconstruction 10/15/2014   Bilateral implant removal and DIEP performed in [place]   Tachycardia    takes Metoprolol daily   [place]ent[place]sustained [place]    during sleep study 2009 with normal cardiac workup    Outpatient Medications Prior to Visit  Medication Sig Dispense Refill   allopurinol (ZYLOPRIM) 100 MG tablet TAKE 1 TABLET BY MOUTH ONCE DAILY 90 tablet 0  ASPIRIN LOW DOSE 81 MG chewable tablet Chew 81 mg by mouth daily.   0   Cholecalciferol (VITAMIN D3) 50 MCG (2000 UT) TABS Take 2,000 Units by mouth daily.     clopidogrel (PLAVIX) 75 MG tablet Take 1 tablet (75 mg total) by mouth daily. 90 tablet 1   cyanocobalamin (,VITAMIN B-12,) 1000 MCG/ML injection Inject 1,000 mcg into the muscle every 30 (thirty) days.     Doxylamine-DM & DM (VICKS DAYQUIL/NYQUIL COUGH) 6.25-15 & 15 MG/15ML LQPK Take 15 mLs by mouth every 6 (six) hours as needed (allergy symptoms).     ezetimibe (ZETIA) 10 MG tablet TAKE 1 TABLET(10 MG) BY MOUTH DAILY 90 tablet 2   fluticasone (FLONASE) 50 MCG/ACT nasal spray Place 1 spray into both nostrils daily.     furosemide (LASIX) 40 MG tablet Take 40 mg by mouth daily as needed for fluid.      loratadine (CLARITIN) 10 MG tablet Take 10 mg by mouth daily.     methocarbamol  (ROBAXIN) 500 MG tablet Take 1 tablet (500 mg total) by mouth every 8 (eight) hours as needed for muscle spasms. 10 tablet 0   metoprolol succinate (TOPROL-XL) 25 MG 24 hr tablet TAKE 1 TABLET(25 MG) BY MOUTH DAILY 90 tablet 3   Multiple Vitamin (MULTI-VITAMIN) tablet Take 1 tablet by mouth daily.     Multiple Vitamins-Minerals (HAIR/SKIN/NAILS) CAPS Take 1 tablet by mouth daily.     nitroGLYCERIN (NITROSTAT) 0.4 MG SL tablet Place 1 tablet (0.4 mg total) under the tongue every 5 (five) minutes as needed for chest pain. 25 tablet 3   NONFORMULARY OR COMPOUNDED ITEM Vitamin E vaginal cream 200u/ml.  One ml pv three times weekly. 36 each 1   pantoprazole (PROTONIX) 40 MG tablet TAKE 1 TABLET(40 MG) BY MOUTH DAILY 90 tablet 3   polyethylene glycol (MIRALAX / GLYCOLAX) 17 g packet Take 17 g by mouth daily as needed for mild constipation. 14 each 0   predniSONE (DELTASONE) 10 MG tablet Take 10 mg by mouth 2 (two) times daily.     Propylene Glycol (SYSTANE COMPLETE) 0.6 % SOLN Place 1 drop into both eyes 2 (two) times daily.     rosuvastatin (CRESTOR) 20 MG tablet Take 1 tablet (20 mg total) by mouth daily. 90 tablet 3   sertraline (ZOLOFT) 100 MG tablet Take 100 mg by mouth daily.     testosterone cypionate (DEPOTESTOSTERONE CYPIONATE) 200 MG/ML injection Inject 200 mg into the muscle every 30 (thirty) days.     traMADol (ULTRAM) 50 MG tablet Take 1 tablet (50 mg total) by mouth every 6 (six) hours as needed for moderate pain. 10 tablet 0   No facility-administered medications prior to visit.     Objective:     BP 121/60 (BP Location: Left Arm, Patient Position: Sitting, Cuff Size: Normal)   Pulse 89   Temp 98.4 F (36.9 C) (Core)   Ht _0  (1.575 m)   Wt 147 lb 9.6 oz (67 kg)   LMP 08/17/1977 (Approximate)   SpO2 99% Comment: on RA  BMI 27.00 kg/m   SpO2: 99 % (on RA)  Amb pleasant wf nad    HEENT : Oropharynx  clear   Nasal turbinates nl    NECK :  without  apparent JVD/  palpable Nodes/TM    LUNGS: no acc muscle use,  Min barrel  contour chest wall with bilateral  slightly decreased bs s audible wheeze and  without cough on insp or  exp maneuvers and min  Hyperresonant  to  percussion bilaterally    CV:  RRR  no s3 or murmur or increase in P2, and no edema   ABD:  soft and nontender with pos end  insp Hoover's  in the supine position.  No bruits or organomegaly appreciated   MS:  Nl gait/ ext warm without deformities Or obvious joint restrictions  calf tenderness, cyanosis or clubbing     SKIN: warm and dry without lesions    NEURO:  alert, approp, nl sensorium with  no motor or cerebellar deficits apparent.             I personally reviewed images and agree with radiology impression as follows:   Chest CTa 12/29/21 1. No evidence for pulmonary embolism. 2. Nodular densities measuring up to 4 mm are unchanged from 2020 favored as benign. 3. Subacute left sixth, seventh and eighth rib fractures. Correlate clinically.   Assessment   No problem-specific Assessment & Plan notes found for this encounter.     Christinia Gully, MD 06/19/2022

## 2022-06-21 ENCOUNTER — Encounter: Payer: Self-pay | Admitting: Internal Medicine

## 2022-06-21 NOTE — Assessment & Plan Note (Signed)
Onset 12/2021 sp L TKR 12/02/21   with neg CTa 12/29/21  -  06/19/2022   Walked on RA3  x  750  lap(s) =  approx 750  ft  @ slow  pace, stopped due to end of study  with lowest 02 sats 97%  and no sob   - Spirometry 06/19/2022  FEV1 1.46 (82%)  Ratio 0.79  s curvature on f/v loop - 06/19/2022  After extensive coaching inhaler device,  effectiveness = 75% so try breztri 2bid to see if improves symptoms and if not no need to use rx of symbicort 160  2bid  (the reason for this is that she is tapering off pred and if any symptoms flare as come off she may have an asthmatic component, but certainly does not have significant copd  - Echo 06/30/22 planned   Really hard to put this case together and the recent trial of prednisone may have distorted my evaluation making her look better than she was but not yielding a specific dx so advised she have an inhaler for her trip to [place] but won't likely need any 02 at rest there x sleeping on cpap, which is her custom. Titrate with ex to keep > 90% if needed   However, advised to monitor sats at peak exertion and low threshold to try laba/ics as above  - may also benefit from HRCT chest but not sure it would yield any info on prednisone with no desats or even symptoms noted today walking as fast a pace as she can given ongoing knee issues which may flare as well when she comes off steroids.  Each maintenance medication was reviewed in detail including emphasizing most importantly the difference between maintenance and prns and under what circumstances the prns are to be triggered using an action plan format where appropriate.  Total time for H and P, chart review, counseling, reviewing hfa/02 device(s) , directly observing portions of ambulatory 02 saturation study/ and generating customized AVS unique to this office visit / same day charting > 45 min very complex pt with chronic refractory resp symptoms of unknown dx

## 2022-06-22 ENCOUNTER — Telehealth (HOSPITAL_BASED_OUTPATIENT_CLINIC_OR_DEPARTMENT_OTHER): Payer: Self-pay

## 2022-06-22 DIAGNOSIS — E785 Hyperlipidemia, unspecified: Secondary | ICD-10-CM

## 2022-06-22 MED ORDER — ROSUVASTATIN CALCIUM 20 MG PO TABS: 20.0000 mg | ORAL_TABLET | Freq: Every day | ORAL | 3 refills | Status: DC

## 2022-06-22 NOTE — Addendum Note (Signed)
Addended by: Gaetano Net on: 06/22/2022 02:54 PM   Modules accepted: Orders

## 2022-06-22 NOTE — Telephone Encounter (Signed)
Received fax from [facility] requesting refills for Rosuvastatin. Routing to [facility] refill pool, pt of Dr. Burt Knack.

## 2022-06-23 DIAGNOSIS — R509 Fever, unspecified: Secondary | ICD-10-CM | POA: Diagnosis not present

## 2022-06-23 DIAGNOSIS — I2510 Atherosclerotic heart disease of native coronary artery without angina pectoris: Secondary | ICD-10-CM | POA: Diagnosis not present

## 2022-06-23 DIAGNOSIS — N1832 Chronic kidney disease, stage 3b: Secondary | ICD-10-CM | POA: Diagnosis not present

## 2022-06-23 DIAGNOSIS — J069 Acute upper respiratory infection, unspecified: Secondary | ICD-10-CM | POA: Diagnosis not present

## 2022-06-23 DIAGNOSIS — I129 Hypertensive chronic kidney disease with stage 1 through stage 4 chronic kidney disease, or unspecified chronic kidney disease: Secondary | ICD-10-CM | POA: Diagnosis not present

## 2022-06-25 ENCOUNTER — Telehealth (HOSPITAL_COMMUNITY): Payer: Self-pay

## 2022-06-25 NOTE — Telephone Encounter (Signed)
Detailed instructions left on the patient's answering machine. Asked to call back with any questions. S.Jesi Jurgens EMTP 

## 2022-06-30 ENCOUNTER — Ambulatory Visit (HOSPITAL_BASED_OUTPATIENT_CLINIC_OR_DEPARTMENT_OTHER): Payer: Medicare Other

## 2022-06-30 ENCOUNTER — Ambulatory Visit (HOSPITAL_COMMUNITY): Payer: Medicare Other | Attending: Physician Assistant

## 2022-06-30 DIAGNOSIS — R079 Chest pain, unspecified: Secondary | ICD-10-CM

## 2022-06-30 DIAGNOSIS — N1832 Chronic kidney disease, stage 3b: Secondary | ICD-10-CM | POA: Insufficient documentation

## 2022-06-30 DIAGNOSIS — I25119 Atherosclerotic heart disease of native coronary artery with unspecified angina pectoris: Secondary | ICD-10-CM | POA: Diagnosis not present

## 2022-06-30 DIAGNOSIS — R0602 Shortness of breath: Secondary | ICD-10-CM

## 2022-06-30 DIAGNOSIS — E785 Hyperlipidemia, unspecified: Secondary | ICD-10-CM | POA: Diagnosis not present

## 2022-06-30 DIAGNOSIS — I10 Essential (primary) hypertension: Secondary | ICD-10-CM

## 2022-06-30 LAB — MYOCARDIAL PERFUSION IMAGING
LV dias vol: 51 mL (ref 46–106)
LV sys vol: 16 mL
Nuc Stress EF: 69 %
Peak HR: 81 {beats}/min
Rest HR: 58 {beats}/min
Rest Nuclear Isotope Dose: 10.7 mCi
SDS: 1
SRS: 0
SSS: 1
ST Depression (mm): 0 mm
Stress Nuclear Isotope Dose: 31.1 mCi
TID: 1.05

## 2022-06-30 LAB — ECHOCARDIOGRAM COMPLETE
Area-P 1/2: 3.21 cm2
S' Lateral: 3.3 cm

## 2022-06-30 MED ORDER — REGADENOSON 0.4 MG/5ML IV SOLN
0.4000 mg | Freq: Once | INTRAVENOUS | Status: AC
  Administered 2022-06-30: 0.4 mg via INTRAVENOUS

## 2022-06-30 MED ORDER — TECHNETIUM TC 99M TETROFOSMIN IV KIT
31.1000 | PACK | Freq: Once | INTRAVENOUS | Status: AC | PRN
  Administered 2022-06-30: 31.1 via INTRAVENOUS

## 2022-06-30 MED ORDER — TECHNETIUM TC 99M TETROFOSMIN IV KIT
10.7000 | PACK | Freq: Once | INTRAVENOUS | Status: AC | PRN
  Administered 2022-06-30: 10.7 via INTRAVENOUS

## 2022-07-13 DIAGNOSIS — M2540 Effusion, unspecified joint: Secondary | ICD-10-CM | POA: Diagnosis not present

## 2022-07-13 DIAGNOSIS — M2560 Stiffness of unspecified joint, not elsewhere classified: Secondary | ICD-10-CM | POA: Diagnosis not present

## 2022-07-13 DIAGNOSIS — R0602 Shortness of breath: Secondary | ICD-10-CM | POA: Diagnosis not present

## 2022-07-13 DIAGNOSIS — M79642 Pain in left hand: Secondary | ICD-10-CM | POA: Diagnosis not present

## 2022-07-13 DIAGNOSIS — M79641 Pain in right hand: Secondary | ICD-10-CM | POA: Diagnosis not present

## 2022-07-13 DIAGNOSIS — R768 Other specified abnormal immunological findings in serum: Secondary | ICD-10-CM | POA: Diagnosis not present

## 2022-07-13 DIAGNOSIS — Z6826 Body mass index (BMI) 26.0-26.9, adult: Secondary | ICD-10-CM | POA: Diagnosis not present

## 2022-07-13 DIAGNOSIS — M329 Systemic lupus erythematosus, unspecified: Secondary | ICD-10-CM | POA: Diagnosis not present

## 2022-07-13 DIAGNOSIS — E663 Overweight: Secondary | ICD-10-CM | POA: Diagnosis not present

## 2022-07-17 DIAGNOSIS — R6882 Decreased libido: Secondary | ICD-10-CM | POA: Diagnosis not present

## 2022-07-17 DIAGNOSIS — Z79890 Hormone replacement therapy (postmenopausal): Secondary | ICD-10-CM | POA: Diagnosis not present

## 2022-08-03 ENCOUNTER — Other Ambulatory Visit: Payer: Self-pay | Admitting: Cardiovascular Disease

## 2022-08-03 DIAGNOSIS — L710 Perioral dermatitis: Secondary | ICD-10-CM | POA: Diagnosis not present

## 2022-09-01 ENCOUNTER — Telehealth: Payer: Medicare Other | Admitting: Adult Health

## 2022-09-01 ENCOUNTER — Other Ambulatory Visit: Payer: Self-pay | Admitting: Cardiovascular Disease

## 2022-09-09 DIAGNOSIS — Z23 Encounter for immunization: Secondary | ICD-10-CM | POA: Diagnosis not present

## 2022-09-16 DIAGNOSIS — Z872 Personal history of diseases of the skin and subcutaneous tissue: Secondary | ICD-10-CM | POA: Diagnosis not present

## 2022-09-16 DIAGNOSIS — L578 Other skin changes due to chronic exposure to nonionizing radiation: Secondary | ICD-10-CM | POA: Diagnosis not present

## 2022-09-16 DIAGNOSIS — L814 Other melanin hyperpigmentation: Secondary | ICD-10-CM | POA: Diagnosis not present

## 2022-09-16 DIAGNOSIS — L710 Perioral dermatitis: Secondary | ICD-10-CM | POA: Diagnosis not present

## 2022-09-16 DIAGNOSIS — L218 Other seborrheic dermatitis: Secondary | ICD-10-CM | POA: Diagnosis not present

## 2022-09-16 DIAGNOSIS — D225 Melanocytic nevi of trunk: Secondary | ICD-10-CM | POA: Diagnosis not present

## 2022-09-16 DIAGNOSIS — L821 Other seborrheic keratosis: Secondary | ICD-10-CM | POA: Diagnosis not present

## 2022-10-14 DIAGNOSIS — R0602 Shortness of breath: Secondary | ICD-10-CM | POA: Diagnosis not present

## 2022-10-14 DIAGNOSIS — M2540 Effusion, unspecified joint: Secondary | ICD-10-CM | POA: Diagnosis not present

## 2022-10-14 DIAGNOSIS — M25562 Pain in left knee: Secondary | ICD-10-CM | POA: Diagnosis not present

## 2022-10-14 DIAGNOSIS — M329 Systemic lupus erythematosus, unspecified: Secondary | ICD-10-CM | POA: Diagnosis not present

## 2022-10-14 DIAGNOSIS — R768 Other specified abnormal immunological findings in serum: Secondary | ICD-10-CM | POA: Diagnosis not present

## 2022-10-14 DIAGNOSIS — M79641 Pain in right hand: Secondary | ICD-10-CM | POA: Diagnosis not present

## 2022-10-14 DIAGNOSIS — M2560 Stiffness of unspecified joint, not elsewhere classified: Secondary | ICD-10-CM | POA: Diagnosis not present

## 2022-10-14 DIAGNOSIS — Z6827 Body mass index (BMI) 27.0-27.9, adult: Secondary | ICD-10-CM | POA: Diagnosis not present

## 2022-10-14 DIAGNOSIS — R5383 Other fatigue: Secondary | ICD-10-CM | POA: Diagnosis not present

## 2022-10-14 DIAGNOSIS — M79642 Pain in left hand: Secondary | ICD-10-CM | POA: Diagnosis not present

## 2022-10-14 DIAGNOSIS — E663 Overweight: Secondary | ICD-10-CM | POA: Diagnosis not present

## 2022-10-14 DIAGNOSIS — Z96652 Presence of left artificial knee joint: Secondary | ICD-10-CM | POA: Diagnosis not present

## 2022-10-14 DIAGNOSIS — M791 Myalgia, unspecified site: Secondary | ICD-10-CM | POA: Diagnosis not present

## 2022-10-16 DIAGNOSIS — M25562 Pain in left knee: Secondary | ICD-10-CM | POA: Diagnosis not present

## 2022-10-20 ENCOUNTER — Other Ambulatory Visit: Payer: Self-pay | Admitting: Cardiovascular Disease

## 2022-10-20 DIAGNOSIS — K219 Gastro-esophageal reflux disease without esophagitis: Secondary | ICD-10-CM

## 2022-10-21 DIAGNOSIS — R768 Other specified abnormal immunological findings in serum: Secondary | ICD-10-CM | POA: Diagnosis not present

## 2022-10-21 DIAGNOSIS — M064 Inflammatory polyarthropathy: Secondary | ICD-10-CM | POA: Diagnosis not present

## 2022-10-21 DIAGNOSIS — M353 Polymyalgia rheumatica: Secondary | ICD-10-CM | POA: Diagnosis not present

## 2022-10-21 DIAGNOSIS — Z6826 Body mass index (BMI) 26.0-26.9, adult: Secondary | ICD-10-CM | POA: Diagnosis not present

## 2022-10-21 DIAGNOSIS — M329 Systemic lupus erythematosus, unspecified: Secondary | ICD-10-CM | POA: Diagnosis not present

## 2022-10-21 DIAGNOSIS — E663 Overweight: Secondary | ICD-10-CM | POA: Diagnosis not present

## 2022-10-21 DIAGNOSIS — M2540 Effusion, unspecified joint: Secondary | ICD-10-CM | POA: Diagnosis not present

## 2022-10-21 DIAGNOSIS — M2560 Stiffness of unspecified joint, not elsewhere classified: Secondary | ICD-10-CM | POA: Diagnosis not present

## 2022-10-27 DIAGNOSIS — M25562 Pain in left knee: Secondary | ICD-10-CM | POA: Diagnosis not present

## 2022-11-14 DIAGNOSIS — H10021 Other mucopurulent conjunctivitis, right eye: Secondary | ICD-10-CM | POA: Diagnosis not present

## 2022-11-14 DIAGNOSIS — J069 Acute upper respiratory infection, unspecified: Secondary | ICD-10-CM | POA: Diagnosis not present

## 2022-11-30 ENCOUNTER — Other Ambulatory Visit (HOSPITAL_BASED_OUTPATIENT_CLINIC_OR_DEPARTMENT_OTHER): Payer: Self-pay | Admitting: Cardiovascular Disease

## 2022-11-30 DIAGNOSIS — I25118 Atherosclerotic heart disease of native coronary artery with other forms of angina pectoris: Secondary | ICD-10-CM

## 2022-12-04 ENCOUNTER — Other Ambulatory Visit: Payer: Self-pay | Admitting: Cardiovascular Disease

## 2022-12-06 ENCOUNTER — Other Ambulatory Visit: Payer: Self-pay | Admitting: Cardiovascular Disease

## 2022-12-06 DIAGNOSIS — E785 Hyperlipidemia, unspecified: Secondary | ICD-10-CM

## 2022-12-08 DIAGNOSIS — H5310 Unspecified subjective visual disturbances: Secondary | ICD-10-CM | POA: Diagnosis not present

## 2022-12-17 DIAGNOSIS — R030 Elevated blood-pressure reading, without diagnosis of hypertension: Secondary | ICD-10-CM | POA: Diagnosis not present

## 2022-12-17 DIAGNOSIS — Z189 Retained foreign body fragments, unspecified material: Secondary | ICD-10-CM | POA: Diagnosis not present

## 2023-01-06 DIAGNOSIS — Z6828 Body mass index (BMI) 28.0-28.9, adult: Secondary | ICD-10-CM | POA: Diagnosis not present

## 2023-01-06 DIAGNOSIS — M2540 Effusion, unspecified joint: Secondary | ICD-10-CM | POA: Diagnosis not present

## 2023-01-06 DIAGNOSIS — M329 Systemic lupus erythematosus, unspecified: Secondary | ICD-10-CM | POA: Diagnosis not present

## 2023-01-06 DIAGNOSIS — M353 Polymyalgia rheumatica: Secondary | ICD-10-CM | POA: Diagnosis not present

## 2023-01-06 DIAGNOSIS — R5383 Other fatigue: Secondary | ICD-10-CM | POA: Diagnosis not present

## 2023-01-06 DIAGNOSIS — H539 Unspecified visual disturbance: Secondary | ICD-10-CM | POA: Diagnosis not present

## 2023-01-06 DIAGNOSIS — E663 Overweight: Secondary | ICD-10-CM | POA: Diagnosis not present

## 2023-01-06 DIAGNOSIS — R768 Other specified abnormal immunological findings in serum: Secondary | ICD-10-CM | POA: Diagnosis not present

## 2023-01-06 DIAGNOSIS — M064 Inflammatory polyarthropathy: Secondary | ICD-10-CM | POA: Diagnosis not present

## 2023-01-06 DIAGNOSIS — M2560 Stiffness of unspecified joint, not elsewhere classified: Secondary | ICD-10-CM | POA: Diagnosis not present

## 2023-01-14 DIAGNOSIS — N390 Urinary tract infection, site not specified: Secondary | ICD-10-CM | POA: Diagnosis not present

## 2023-02-03 DIAGNOSIS — M064 Inflammatory polyarthropathy: Secondary | ICD-10-CM | POA: Diagnosis not present

## 2023-02-23 DIAGNOSIS — H43813 Vitreous degeneration, bilateral: Secondary | ICD-10-CM | POA: Diagnosis not present

## 2023-03-09 DIAGNOSIS — E663 Overweight: Secondary | ICD-10-CM | POA: Diagnosis not present

## 2023-03-09 DIAGNOSIS — M2560 Stiffness of unspecified joint, not elsewhere classified: Secondary | ICD-10-CM | POA: Diagnosis not present

## 2023-03-09 DIAGNOSIS — R768 Other specified abnormal immunological findings in serum: Secondary | ICD-10-CM | POA: Diagnosis not present

## 2023-03-09 DIAGNOSIS — M329 Systemic lupus erythematosus, unspecified: Secondary | ICD-10-CM | POA: Diagnosis not present

## 2023-03-09 DIAGNOSIS — H539 Unspecified visual disturbance: Secondary | ICD-10-CM | POA: Diagnosis not present

## 2023-03-09 DIAGNOSIS — M2540 Effusion, unspecified joint: Secondary | ICD-10-CM | POA: Diagnosis not present

## 2023-03-09 DIAGNOSIS — M0609 Rheumatoid arthritis without rheumatoid factor, multiple sites: Secondary | ICD-10-CM | POA: Diagnosis not present

## 2023-03-09 DIAGNOSIS — M064 Inflammatory polyarthropathy: Secondary | ICD-10-CM | POA: Diagnosis not present

## 2023-03-09 DIAGNOSIS — Z6828 Body mass index (BMI) 28.0-28.9, adult: Secondary | ICD-10-CM | POA: Diagnosis not present

## 2023-03-09 DIAGNOSIS — M353 Polymyalgia rheumatica: Secondary | ICD-10-CM | POA: Diagnosis not present

## 2023-03-09 DIAGNOSIS — R5383 Other fatigue: Secondary | ICD-10-CM | POA: Diagnosis not present

## 2023-03-13 DIAGNOSIS — Z111 Encounter for screening for respiratory tuberculosis: Secondary | ICD-10-CM | POA: Diagnosis not present

## 2023-03-17 ENCOUNTER — Other Ambulatory Visit: Payer: Self-pay | Admitting: Cardiovascular Disease

## 2023-03-17 DIAGNOSIS — I25118 Atherosclerotic heart disease of native coronary artery with other forms of angina pectoris: Secondary | ICD-10-CM

## 2023-03-19 ENCOUNTER — Encounter: Payer: Self-pay | Admitting: Cardiovascular Disease

## 2023-03-31 DIAGNOSIS — E785 Hyperlipidemia, unspecified: Secondary | ICD-10-CM | POA: Diagnosis not present

## 2023-03-31 DIAGNOSIS — I2510 Atherosclerotic heart disease of native coronary artery without angina pectoris: Secondary | ICD-10-CM | POA: Diagnosis not present

## 2023-03-31 DIAGNOSIS — E538 Deficiency of other specified B group vitamins: Secondary | ICD-10-CM | POA: Diagnosis not present

## 2023-03-31 DIAGNOSIS — I129 Hypertensive chronic kidney disease with stage 1 through stage 4 chronic kidney disease, or unspecified chronic kidney disease: Secondary | ICD-10-CM | POA: Diagnosis not present

## 2023-03-31 DIAGNOSIS — N1832 Chronic kidney disease, stage 3b: Secondary | ICD-10-CM | POA: Diagnosis not present

## 2023-03-31 DIAGNOSIS — M109 Gout, unspecified: Secondary | ICD-10-CM | POA: Diagnosis not present

## 2023-03-31 DIAGNOSIS — D649 Anemia, unspecified: Secondary | ICD-10-CM | POA: Diagnosis not present

## 2023-04-01 ENCOUNTER — Other Ambulatory Visit: Payer: Self-pay

## 2023-04-01 ENCOUNTER — Encounter: Payer: Self-pay | Admitting: Adult Health

## 2023-04-01 ENCOUNTER — Inpatient Hospital Stay: Payer: Medicare Other | Attending: Adult Health | Admitting: Adult Health

## 2023-04-01 VITALS — BP 144/51 | HR 65 | Temp 97.5°F | Resp 18 | Ht 62.0 in | Wt 152.8 lb

## 2023-04-01 DIAGNOSIS — Z90722 Acquired absence of ovaries, bilateral: Secondary | ICD-10-CM | POA: Insufficient documentation

## 2023-04-01 DIAGNOSIS — N1832 Chronic kidney disease, stage 3b: Secondary | ICD-10-CM | POA: Diagnosis not present

## 2023-04-01 DIAGNOSIS — Z9981 Dependence on supplemental oxygen: Secondary | ICD-10-CM | POA: Insufficient documentation

## 2023-04-01 DIAGNOSIS — Z9049 Acquired absence of other specified parts of digestive tract: Secondary | ICD-10-CM | POA: Insufficient documentation

## 2023-04-01 DIAGNOSIS — Z90710 Acquired absence of both cervix and uterus: Secondary | ICD-10-CM | POA: Diagnosis not present

## 2023-04-01 DIAGNOSIS — M85851 Other specified disorders of bone density and structure, right thigh: Secondary | ICD-10-CM | POA: Insufficient documentation

## 2023-04-01 DIAGNOSIS — Z803 Family history of malignant neoplasm of breast: Secondary | ICD-10-CM | POA: Insufficient documentation

## 2023-04-01 DIAGNOSIS — Z8249 Family history of ischemic heart disease and other diseases of the circulatory system: Secondary | ICD-10-CM | POA: Diagnosis not present

## 2023-04-01 DIAGNOSIS — Z79899 Other long term (current) drug therapy: Secondary | ICD-10-CM | POA: Diagnosis not present

## 2023-04-01 DIAGNOSIS — Z87891 Personal history of nicotine dependence: Secondary | ICD-10-CM | POA: Insufficient documentation

## 2023-04-01 DIAGNOSIS — M25511 Pain in right shoulder: Secondary | ICD-10-CM | POA: Diagnosis not present

## 2023-04-01 DIAGNOSIS — Z9013 Acquired absence of bilateral breasts and nipples: Secondary | ICD-10-CM | POA: Insufficient documentation

## 2023-04-01 DIAGNOSIS — Z170 Estrogen receptor positive status [ER+]: Secondary | ICD-10-CM | POA: Diagnosis not present

## 2023-04-01 DIAGNOSIS — S46111A Strain of muscle, fascia and tendon of long head of biceps, right arm, initial encounter: Secondary | ICD-10-CM | POA: Diagnosis not present

## 2023-04-01 DIAGNOSIS — M1990 Unspecified osteoarthritis, unspecified site: Secondary | ICD-10-CM | POA: Diagnosis not present

## 2023-04-01 DIAGNOSIS — C50411 Malignant neoplasm of upper-outer quadrant of right female breast: Secondary | ICD-10-CM | POA: Diagnosis not present

## 2023-04-01 DIAGNOSIS — I252 Old myocardial infarction: Secondary | ICD-10-CM | POA: Diagnosis not present

## 2023-04-01 DIAGNOSIS — I129 Hypertensive chronic kidney disease with stage 1 through stage 4 chronic kidney disease, or unspecified chronic kidney disease: Secondary | ICD-10-CM | POA: Insufficient documentation

## 2023-04-01 DIAGNOSIS — Z923 Personal history of irradiation: Secondary | ICD-10-CM | POA: Diagnosis not present

## 2023-04-01 DIAGNOSIS — Z8719 Personal history of other diseases of the digestive system: Secondary | ICD-10-CM | POA: Diagnosis not present

## 2023-04-01 DIAGNOSIS — M25811 Other specified joint disorders, right shoulder: Secondary | ICD-10-CM | POA: Diagnosis not present

## 2023-04-01 NOTE — Progress Notes (Signed)
[facility] Cancer Follow up:    Alysia Penna, MD [address] [place] [address]   DIAGNOSIS:  Cancer Staging  Breast cancer of upper-outer quadrant of right female breast [facility]) Staging form: Breast, AJCC 7th Edition - Clinical: Stage IA (T1a, N0, cM0) - Unsigned Specimen type: Core Needle Biopsy Histopathologic type: 9931 Laterality: Right - Pathologic: No stage assigned - Unsigned Specimen type: Core Needle Biopsy Histopathologic type: 9931 Laterality: Right   SUMMARY OF ONCOLOGIC HISTORY:  (1)  status post right breast upper outer quadrant biopsy 03/24/2013 for a pT1b cN0, stage IA invasive ductal carcinoma, grade 1, strongly estrogen and progesterone receptor positive, HER-2 negative   (2) status post bilateral mastectomies 05/23/2013 showing:             (a) on the right, a pT1b pN0, stage invasive ductal carcinoma, grade 1, again HER-2 negative             (b) on the left, no evidence of malignancy   (3) Oncotype DX score of 20 predicted a rate of distant recurrence within 10 years of 13% if the patient's only systemic treatment was tamoxifen for 5 years.   (4) a positive margin required adjuvant radiation, completed January 2015 in [place]   (5) status post bilateral implant placement January 2015             (a) with impending right implant rupture February 2016 underwent bilateral DIEP reconstruction under Dr. Avelino Leeds in [place] (fax [phone])   (6)  started on anastrozole, mid September 2014, prior to definitive surgery; continued postop; bone density 07/31/2013 was normal             (a) repeat bone density 02/03/2016 showed a T score of -0.1 (normal             (b) switched to tamoxifen November 2018 because of arthralgias secondary to the anastrozole             (c) switched to exemestane as of 12/13/2017             (d) completing five years of anti-estrogens September 2019  CURRENT THERAPY: Observation  INTERVAL  HISTORY: Tracey [name] 77 y.o. female returns for follow-up of her history of breast cancer.  Of note she does have a new diagnosis of polymyalgia rheumatica and is following with rheumatology.  This has been very challenging for her to deal with because she is lost ability to raise her right arm and she enjoys golfing tremendously.  Her most recent bone density testing occurred on April 08, 2022 demonstrating osteopenia with a T-score of -1.3 in the right femur.     Patient Active Problem List   Diagnosis Date Noted   Fever of unknown origin 06/10/2022   Stage 3b chronic kidney disease (HCC) 06/10/2022   ARF (acute renal failure) (HCC) 12/30/2021   DOE (dyspnea on exertion) 12/29/2021   S/P total knee arthroplasty, left 12/02/2021   Preoperative cardiovascular examination 11/18/2021   REM behavioral disorder 06/12/2021   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   CPAP (continuous positive airway pressure) dependence 01/20/2021   Unsteady gait when walking 01/20/2021   Complaint related to dreams 06/05/2019   Metabolic acidosis with increased anion gap and accumulation of organic acids 08/17/2018   Nausea, vomiting, and diarrhea 08/17/2018   AKI (acute kidney injury) (HCC) 08/17/2018   Overweight (BMI 25.0-29.9) 07/27/2018   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/27/2018  S/P right TKA 07/26/2018   S/P knee replacement 07/26/2018   UARS (upper airway resistance syndrome) 05/30/2018   Hyperlipidemia LDL goal <70 05/02/2018   Leg injury, right, initial encounter 01/06/2017   Contusion of right lower leg 01/06/2017   Hypoxemia 12/23/2015   Statin myopathy 05/20/2015   MCI (mild cognitive impairment) 05/20/2015   Edema 05/17/2015   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  MEDICAL HISTORY: Past Medical History:  Diagnosis Date   Allergy    Anxiety    takes Ativan daily prn   Arthritis    Breast cancer (HCC) 2014   ER+/PR+/HEr2-,    Bruises easily    Colon polyps    2 polyps by report   Coronary artery disease involving native coronary artery of native heart without angina pectoris 01/01/2014   NSTEMI in January 2019 in [place]>> s/p DES x2 to the LAD (procedure complicated by stent thrombosis) Myoview 05/2020: EF 72, no infarct or ischemia; low risk Echocardiogram 05/2019: EF 60-65, GLS -21.5 Cardiac catheter[facility] at [facility] in 08/2017: Normal left main, normal LCx, normal RCA; LAD/diagonal 95%>> PCI    Diverticulosis    GERD (gastroesophageal reflux disease)    occasionally takes Nexium    Gout    Headache    MIgraines- no headaches- has floater in eyes   History of bladder infections    many yrs ago   History of bronchitis    last time at least 55yrs ago   History of hiatal hernia    History of stress incontinence    Hyperlipidemia    takes Pravastatin daily   Hypertension    Insomnia    takes Ambien nightly prn   NSTEMI (non-ST elevated myocardial infarction) (HCC) 08/2017   OSA (obstructive sleep apnea)    on CPAP   Osteoarthritis    Peripheral edema    takes Furosemide daily prn   PONV (postoperative nausea and vomiting)    Postmenopausal hormone therapy    Radiation 07/27/13-09/07/13   Right Breast x 31 treatments   Rhinitis    uses Flonase prn   Sinus congestion    Status post breast reconstruction 10/15/2014   Bilateral implant removal and DIEP  performed in [place]   Tachycardia    takes Metoprolol daily   Ventricular tachycardia, non-sustained (HCC)    during sleep study 2009 with normal cardiac workup    SURGICAL HISTORY: Past Surgical History:  Procedure Laterality Date   ABDOMINAL HYSTERECTOMY     with BSO   APPENDECTOMY     ARTERY BIOPSY Left 05/15/2022   Procedure: BIOPSY TEMPORAL ARTERY;  Surgeon: Cephus Shelling, MD;  Location: [facility] OR;  Service: Vascular;  Laterality: Left;   AXILLARY SENTINEL NODE BIOPSY Right 05/23/2013   Procedure: AXILLARY SENTINEL NODE BIOPSY;  Surgeon: Adolph Pollack, MD;  Location: [facility] OR;  Service: General;  Laterality: Right;  nuc med injection 7:00   BREAST RECONSTRUCTION WITH PLACEMENT OF TISSUE EXPANDER AND FLEX HD (  ACELLULAR HYDRATED DERMIS) Bilateral 05/23/2013   Procedure: BILATERAL BREAST RECONSTRUCTION WITH PLACEMENT OF TISSUE EXPANDER AND FLEX HD;  Surgeon: Etter Sjogren, MD;  Location: [facility] OR;  Service: Plastics;  Laterality: Bilateral;   CARDIAC CATHETERIZATION  1999   CATARACT EXTRACTION, BILATERAL Bilateral    CHOLECYSTECTOMY     COSMETIC SURGERY     OTHER SURGICAL HISTORY  09/2017   had cyst removal from spine    PERCUTANEOUS CORONARY STENT INTERVENTION (PCI-S)  08/2017   done in [place]     REMOVAL OF BILATERAL TISSUE EXPANDERS WITH PLACEMENT OF BILATERAL BREAST IMPLANTS Bilateral 05/17/2014   Procedure: REMOVAL OF BILATERAL TISSUE EXPANDERS WITH PLACEMENT OF BILATERAL BREAST IMPLANTS FOR RECONSTRUCTION;  Surgeon: Etter Sjogren, MD;  Location: [facility] OR;  Service: Plastics;  Laterality: Bilateral;   TONSILLECTOMY     TOTAL KNEE ARTHROPLASTY Right 07/26/2018   Procedure: TOTAL KNEE ARTHROPLASTY;  Surgeon: Durene Romans, MD;  Location: [facility] ORS;  Service: Orthopedics;  Laterality: Right;  70 mins   TOTAL KNEE ARTHROPLASTY Left 12/02/2021   Procedure: TOTAL KNEE ARTHROPLASTY;  Surgeon: Durene Romans, MD;  Location: [facility] ORS;  Service: Orthopedics;  Laterality: Left;   TOTAL  MASTECTOMY Bilateral 05/23/2013   Procedure: TOTAL MASTECTOMY;  Surgeon: Adolph Pollack, MD;  Location: [facility] OR;  Service: General;  Laterality: Bilateral;    SOCIAL HISTORY: Social History   Socioeconomic History   Marital status: Married    Spouse name: Not on file   Number of children: 2   Years of education: Not on file   Highest education level: Not on file  Occupational History   Not on file  Tobacco Use   Smoking status: Former    Current packs/day: 1.00    Average packs/day: 1 pack/day for 30.8 years (30.8 ttl pk-yrs)    Types: Cigarettes    Start date: 06/09/1992   Smokeless tobacco: Never   Tobacco comments:    quit at age 13  Vaping Use   Vaping status: Never Used  Substance and Sexual Activity   Alcohol use: Not Currently    Alcohol/week: 1.0 standard drink of alcohol    Types: 1 Standard drinks or equivalent per week   Drug use: Not Currently    Types: Other-see comments   Sexual activity: Yes    Birth control/protection: Surgical  Other Topics Concern   Not on file  Social History Narrative   Not on file   Social Determinants of Health   Financial Resource Strain: Not on file  Food Insecurity: Not on file  Transportation Needs: Not on file  Physical Activity: Not on file  Stress: Not on file  Social Connections: Not on file  Intimate Partner Violence: Not on file    FAMILY HISTORY: Family History  Problem Relation Age of Onset   Breast cancer Mother        possible inflammatory breast cancer   Heart attack Father    Heart disease Father    ALS Sister    Breast cancer Maternal Grandmother 32   Heart attack Maternal Grandfather    Heart attack Paternal Grandfather    Breast cancer Maternal Aunt        dx in her 59s   Breast cancer Other        maternal great grandmother; dx in her 65s    Review of Systems  Constitutional:  Positive for fatigue. Negative for appetite change, chills, fever and unexpected weight change.  HENT:   Negative  for hearing loss,  lump/mass and trouble swallowing.   Eyes:  Negative for eye problems and icterus.  Respiratory:  Negative for chest tightness, cough and shortness of breath.   Cardiovascular:  Negative for chest pain, leg swelling and palpitations.  Gastrointestinal:  Negative for abdominal distention, abdominal pain, constipation, diarrhea, nausea and vomiting.  Endocrine: Negative for hot flashes.  Genitourinary:  Negative for difficulty urinating.   Musculoskeletal:  Positive for arthralgias.  Skin:  Negative for itching and rash.  Neurological:  Negative for dizziness, extremity weakness, headaches and numbness.  Hematological:  Negative for adenopathy. Does not bruise/bleed easily.  Psychiatric/Behavioral:  Negative for depression. The patient is not nervous/anxious.       PHYSICAL EXAMINATION   Onc Performance Status - 04/01/23 1124       ECOG Perf Status   ECOG Perf Status Restricted in physically strenuous activity but ambulatory and able to carry out work of a light or sedentary nature, e.g., light house work, office work      KPS SCALE   KPS % SCORE Able to carry on normal activity, minor s/s of disease             Vitals:   04/01/23 1123  BP: (!) 144/51  Pulse: 65  Resp: 18  Temp: (!) 97.5 F (36.4 C)  SpO2: 100%    Physical Exam Constitutional:      General: She is not in acute distress.    Appearance: Normal appearance. She is not toxic-appearing.  HENT:     Head: Normocephalic and atraumatic.     Mouth/Throat:     Mouth: Mucous membranes are moist.     Pharynx: Oropharynx is clear. No oropharyngeal exudate or posterior oropharyngeal erythema.  Eyes:     General: No scleral icterus. Cardiovascular:     Rate and Rhythm: Normal rate and regular rhythm.     Pulses: Normal pulses.     Heart sounds: Normal heart sounds.  Pulmonary:     Effort: Pulmonary effort is normal.     Breath sounds: Normal breath sounds.  Chest:     Comments: Right breast  status postmastectomy, reconstruction, and radiation--no sign of local recurrence; left breast status postmastectomy and reconstruction, benign Abdominal:     General: Abdomen is flat. Bowel sounds are normal. There is no distension.     Palpations: Abdomen is soft.     Tenderness: There is no abdominal tenderness.  Musculoskeletal:        General: No swelling.     Cervical back: Neck supple.  Lymphadenopathy:     Cervical: No cervical adenopathy.  Skin:    General: Skin is warm and dry.     Findings: No rash.  Neurological:     General: No focal deficit present.     Mental Status: She is alert.  Psychiatric:        Mood and Affect: Mood normal.        Behavior: Behavior normal.      ASSESSMENT and THERAPY PLAN:   Breast cancer of upper-outer quadrant of right female breast (HCC) Ms.Tracey [name] is a pleasant 77 y.o. female with history of Stage IA right breast invasive ductal carcinoma, ER+/PR+/HER2-, diagnosed in 03/2013, treated with bilateral mastectomies, and anti estrogen therapy with aromatase inhibitors x 5 years completed in 04/2018.  She [facility] for surveillance and routine follow-up.   Lysette has no clinical signs of breast cancer recurrence.  She will continue to return annually for her breast exam.  She knows to call if she develops any breast changes between now and next year and we will get her in sooner to further evaluate.  I encouraged her to remain positive about her challenging health changes with her polymyalgia rheumatica.  She is continuing to follow-up with rheumatology and is hopeful moving forward with a new treatment will help alleviate her arthralgias and fatigue.  Makinnley will continue to follow with primary care regularly.  We will see her back next year for continued long-term follow-up.   All questions were answered. The patient knows to call the clinic with any problems, questions or concerns. We can certainly see the patient much  sooner if necessary.  Total encounter time:20 minutes*in face-to-face visit time, chart review, lab review, care coordination, order entry, and documentation of the encounter time.  Lillard Anes, NP 04/01/23 11:56 AM Medical Oncology and Hematology [facility] [address] [place], [place] [address] Tel. [phone]    Fax. [phone]  *Total Encounter Time as defined by the Centers for Medicare and Medicaid Services includes, in addition to the face-to-face time of a patient visit (documented in the note above) non-face-to-face time: obtaining and reviewing outside history, ordering and reviewing medications, tests or procedures, care coordination (communications with other health care professionals or caregivers) and documentation in the medical record.

## 2023-04-01 NOTE — Assessment & Plan Note (Signed)
Ms.. Odonohue is a pleasant 77 y.o. female with history of Stage IA right breast invasive ductal carcinoma, ER+/PR+/HER2-, diagnosed in 03/2013, treated with bilateral mastectomies, and anti estrogen therapy with aromatase inhibitors x 5 years completed in 04/2018.  [facility] for surveillance and routine follow-up.   Tracey [name] has no clinical signs of breast cancer recurrence.  She will continue to return annually for her breast exam.  She knows to call if she develops any breast changes between now and next year and we will get her in sooner to further evaluate.  I encouraged her to remain positive about her challenging health changes with her polymyalgia rheumatica.  She is continuing to follow-up with rheumatology and is hopeful moving forward with a new treatment will help alleviate her arthralgias and fatigue.  [name] will continue to follow with primary care regularly.  We will see her back next year for continued long-term follow-up.

## 2023-04-07 DIAGNOSIS — I2510 Atherosclerotic heart disease of native coronary artery without angina pectoris: Secondary | ICD-10-CM | POA: Diagnosis not present

## 2023-04-07 DIAGNOSIS — I129 Hypertensive chronic kidney disease with stage 1 through stage 4 chronic kidney disease, or unspecified chronic kidney disease: Secondary | ICD-10-CM | POA: Diagnosis not present

## 2023-04-07 DIAGNOSIS — Z1339 Encounter for screening examination for other mental health and behavioral disorders: Secondary | ICD-10-CM | POA: Diagnosis not present

## 2023-04-07 DIAGNOSIS — C50919 Malignant neoplasm of unspecified site of unspecified female breast: Secondary | ICD-10-CM | POA: Diagnosis not present

## 2023-04-07 DIAGNOSIS — M109 Gout, unspecified: Secondary | ICD-10-CM | POA: Diagnosis not present

## 2023-04-07 DIAGNOSIS — M549 Dorsalgia, unspecified: Secondary | ICD-10-CM | POA: Diagnosis not present

## 2023-04-07 DIAGNOSIS — N1832 Chronic kidney disease, stage 3b: Secondary | ICD-10-CM | POA: Diagnosis not present

## 2023-04-07 DIAGNOSIS — Z0000 Encounter for general adult medical examination without abnormal findings: Secondary | ICD-10-CM | POA: Diagnosis not present

## 2023-04-07 DIAGNOSIS — R82998 Other abnormal findings in urine: Secondary | ICD-10-CM | POA: Diagnosis not present

## 2023-04-07 DIAGNOSIS — Z1331 Encounter for screening for depression: Secondary | ICD-10-CM | POA: Diagnosis not present

## 2023-04-07 DIAGNOSIS — Z23 Encounter for immunization: Secondary | ICD-10-CM | POA: Diagnosis not present

## 2023-04-07 DIAGNOSIS — M75101 Unspecified rotator cuff tear or rupture of right shoulder, not specified as traumatic: Secondary | ICD-10-CM | POA: Diagnosis not present

## 2023-04-07 DIAGNOSIS — G4733 Obstructive sleep apnea (adult) (pediatric): Secondary | ICD-10-CM | POA: Diagnosis not present

## 2023-04-09 DIAGNOSIS — M25511 Pain in right shoulder: Secondary | ICD-10-CM | POA: Diagnosis not present

## 2023-04-21 DIAGNOSIS — M25811 Other specified joint disorders, right shoulder: Secondary | ICD-10-CM | POA: Diagnosis not present

## 2023-04-22 ENCOUNTER — Telehealth: Payer: Self-pay | Admitting: *Deleted

## 2023-04-22 ENCOUNTER — Encounter: Payer: Self-pay | Admitting: Cardiovascular Disease

## 2023-04-22 ENCOUNTER — Ambulatory Visit: Payer: Medicare Other | Attending: Cardiovascular Disease | Admitting: Cardiovascular Disease

## 2023-04-22 VITALS — BP 122/70 | HR 62 | Ht 62.0 in | Wt 154.6 lb

## 2023-04-22 DIAGNOSIS — N1832 Chronic kidney disease, stage 3b: Secondary | ICD-10-CM

## 2023-04-22 DIAGNOSIS — I10 Essential (primary) hypertension: Secondary | ICD-10-CM

## 2023-04-22 DIAGNOSIS — E785 Hyperlipidemia, unspecified: Secondary | ICD-10-CM

## 2023-04-22 DIAGNOSIS — I25118 Atherosclerotic heart disease of native coronary artery with other forms of angina pectoris: Secondary | ICD-10-CM

## 2023-04-22 NOTE — Progress Notes (Signed)
Cardiology Office Note:    Date:  04/22/2023   ID:  Tracey [name], DOB 1946/02/11, MRN [id number]  PCP:  Alysia Penna, MD   [facility] Providers Cardiologist:  Tonny Bollman, MD Cardiology APP:  Kennon Rounds     Referring MD: Alysia Penna, MD   Chief Complaint  Patient presents with   Coronary Artery Disease    History of Present Illness:    Tracey [name] is a 77 y.o. female with a hx of:  Coronary artery disease  NSTEMI in Jan 2019 (in [place]) s/p DES to LAD C/b stent thrombosis - required 2nd stent Intol to higher dose beta-blocker due to fatigue Hyperlipidemia  Hypertension  Chronic kidney disease  Sleep apnea  Aortic atherosclerosis  Breast CA s/p double mastectomy, radiation  The patient is here alone today, presenting for preoperative cardiac evaluation before undergoing right shoulder replacement. She has no cardiac concerns today. Today, she denies symptoms of palpitations, chest pain, shortness of breath, orthopnea, PND, lower extremity edema, dizziness, or syncope. She uses a cane when walking on grass or in a football stadium, but otherwise doesn't need a walking aid.  She is having a lot of pain in her right upper arm and shoulder and looks forward to having surgery shop so that she can begin to recover and experience less pain.  Past Medical History:  Diagnosis Date   Allergy    Anxiety    takes Ativan daily prn   Arthritis    Breast cancer (HCC) 2014   ER+/PR+/HEr2-,    Bruises easily    Colon polyps    2 polyps by report   Coronary artery disease involving native coronary artery of native heart without angina pectoris 01/01/2014   NSTEMI in January 2019 in [place]>> s/p DES x2 to the LAD (procedure complicated by stent thrombosis) Myoview 05/2020: EF 72, no infarct or ischemia; low risk Echocardiogram 05/2019: EF 60-65, GLS -21.5 Cardiac catheterization at [facility] in 08/2017: Normal  left main, normal LCx, normal RCA; LAD/diagonal 95%>> PCI    Diverticulosis    GERD (gastroesophageal reflux disease)    occasionally takes Nexium    Gout    Headache    MIgraines- no headaches- has floater in eyes   History of bladder infections    many yrs ago   History of bronchitis    last time at least 56yrs ago   History of hiatal hernia    History of stress incontinence    Hyperlipidemia    takes Pravastatin daily   Hypertension    Insomnia    takes Ambien nightly prn   NSTEMI (non-ST elevated myocardial infarction) (HCC) 08/2017   OSA (obstructive sleep apnea)    on CPAP   Osteoarthritis    Peripheral edema    takes Furosemide daily prn   PONV (postoperative nausea and vomiting)    Postmenopausal hormone therapy    Radiation 07/27/13-09/07/13   Right Breast x 31 treatments   Rhinitis    uses Flonase prn   Sinus congestion    Status post breast reconstruction 10/15/2014   Bilateral implant removal and DIEP performed in [place], [place]   Tachycardia    takes Metoprolol daily   Ventricular tachycardia, non-sustained (HCC)    during sleep study 2009 with normal cardiac workup    Past Surgical History:  Procedure Laterality Date   ABDOMINAL HYSTERECTOMY     with BSO   APPENDECTOMY  ARTERY BIOPSY Left 05/15/2022   Procedure: BIOPSY TEMPORAL ARTERY;  Surgeon: Cephus Shelling, MD;  Location: [facility] OR;  Service: Vascular;  Laterality: Left;   AXILLARY SENTINEL NODE BIOPSY Right 05/23/2013   Procedure: AXILLARY SENTINEL NODE BIOPSY;  Surgeon: Adolph Pollack, MD;  Location: [facility] OR;  Service: General;  Laterality: Right;  nuc med injection 7:00   BREAST RECONSTRUCTION WITH PLACEMENT OF TISSUE EXPANDER AND FLEX HD (ACELLULAR HYDRATED DERMIS) Bilateral 05/23/2013   Procedure: BILATERAL BREAST RECONSTRUCTION WITH PLACEMENT OF TISSUE EXPANDER AND FLEX HD;  Surgeon: Etter Sjogren, MD;  Location: [facility] OR;  Service: Plastics;  Laterality: Bilateral;   CARDIAC CATHETERIZATION  1999    CATARACT EXTRACTION, BILATERAL Bilateral    CHOLECYSTECTOMY     COSMETIC SURGERY     OTHER SURGICAL HISTORY  09/2017   had cyst removal from spine    PERCUTANEOUS CORONARY STENT INTERVENTION (PCI-S)  08/2017   done in [place]     REMOVAL OF BILATERAL TISSUE EXPANDERS WITH PLACEMENT OF BILATERAL BREAST IMPLANTS Bilateral 05/17/2014   Procedure: REMOVAL OF BILATERAL TISSUE EXPANDERS WITH PLACEMENT OF BILATERAL BREAST IMPLANTS FOR RECONSTRUCTION;  Surgeon: Etter Sjogren, MD;  Location: [facility] OR;  Service: Plastics;  Laterality: Bilateral;   TONSILLECTOMY     TOTAL KNEE ARTHROPLASTY Right 07/26/2018   Procedure: TOTAL KNEE ARTHROPLASTY;  Surgeon: Durene Romans, MD;  Location: [facility] ORS;  Service: Orthopedics;  Laterality: Right;  70 mins   TOTAL KNEE ARTHROPLASTY Left 12/02/2021   Procedure: TOTAL KNEE ARTHROPLASTY;  Surgeon: Durene Romans, MD;  Location: [facility] ORS;  Service: Orthopedics;  Laterality: Left;   TOTAL MASTECTOMY Bilateral 05/23/2013   Procedure: TOTAL MASTECTOMY;  Surgeon: Adolph Pollack, MD;  Location: [facility] OR;  Service: General;  Laterality: Bilateral;    Current Medications: Current Meds  Medication Sig   allopurinol (ZYLOPRIM) 100 MG tablet TAKE 1 TABLET BY MOUTH ONCE DAILY   ASPIRIN LOW DOSE 81 MG chewable tablet Chew 81 mg by mouth daily.    Budeson-Glycopyrrol-Formoterol (BREZTRI AEROSPHERE) 160-9-4.8 MCG/ACT AERO Inhale 2 puffs into the lungs in the morning and at bedtime.   budesonide-formoterol (SYMBICORT) 160-4.5 MCG/ACT inhaler Take 2 puffs first thing in am and then another 2 puffs about 12 hours later.   Cholecalciferol (VITAMIN D3) 50 MCG (2000 UT) TABS Take 2,000 Units by mouth daily.   clopidogrel (PLAVIX) 75 MG tablet TAKE 1 TABLET(75 MG) BY MOUTH DAILY   cyanocobalamin (,VITAMIN B-12,) 1000 MCG/ML injection Inject 1,000 mcg into the muscle every 30 (thirty) days.   Doxylamine-DM & DM (VICKS DAYQUIL/NYQUIL COUGH) 6.25-15 & 15 MG/15ML LQPK Take 15 mLs by mouth every 6  (six) hours as needed (allergy symptoms).   ezetimibe (ZETIA) 10 MG tablet TAKE 1 TABLET(10 MG) BY MOUTH DAILY   fluticasone (FLONASE) 50 MCG/ACT nasal spray Place 1 spray into both nostrils daily.   furosemide (LASIX) 40 MG tablet Take 40 mg by mouth daily as needed for fluid.    hydroxychloroquine (PLAQUENIL) 200 MG tablet Take 200 mg by mouth daily.   loratadine (CLARITIN) 10 MG tablet Take 10 mg by mouth daily.   methocarbamol (ROBAXIN) 500 MG tablet Take 1 tablet (500 mg total) by mouth every 8 (eight) hours as needed for muscle spasms.   metoprolol succinate (TOPROL-XL) 25 MG 24 hr tablet TAKE 1 TABLET(25 MG) BY MOUTH DAILY   Multiple Vitamin (MULTI-VITAMIN) tablet Take 1 tablet by mouth daily.   Multiple Vitamins-Minerals (HAIR/SKIN/NAILS) CAPS Take 1 tablet by mouth daily.   nitroGLYCERIN (  NITROSTAT) 0.4 MG SL tablet Place 1 tablet (0.4 mg total) under the tongue every 5 (five) minutes as needed for chest pain.   NONFORMULARY OR COMPOUNDED ITEM Vitamin E vaginal cream 200u/ml.  One ml pv three times weekly.   pantoprazole (PROTONIX) 40 MG tablet TAKE 1 TABLET(40 MG) BY MOUTH DAILY   polyethylene glycol (MIRALAX / GLYCOLAX) 17 g packet Take 17 g by mouth daily as needed for mild constipation.   predniSONE (DELTASONE) 10 MG tablet Take 9 mg by mouth daily.   Propylene Glycol (SYSTANE COMPLETE) 0.6 % SOLN Place 1 drop into both eyes 2 (two) times daily.   rosuvastatin (CRESTOR) 20 MG tablet Take 1 tablet (20 mg total) by mouth daily.   sertraline (ZOLOFT) 100 MG tablet Take 100 mg by mouth daily.   testosterone cypionate (DEPOTESTOSTERONE CYPIONATE) 200 MG/ML injection Inject 200 mg into the muscle every 30 (thirty) days.   traMADol (ULTRAM) 50 MG tablet Take 1 tablet (50 mg total) by mouth every 6 (six) hours as needed for moderate pain.     Allergies:   Pseudoeph-hydrocodone-gg, Ivp dye [iodinated contrast media], Oxycodone, Sulfa antibiotics, and Tape   Social History    Socioeconomic History   Marital status: Married    Spouse name: Not on file   Number of children: 2   Years of education: Not on file   Highest education level: Not on file  Occupational History   Not on file  Tobacco Use   Smoking status: Former    Current packs/day: 1.00    Average packs/day: 1 pack/day for 30.9 years (30.9 ttl pk-yrs)    Types: Cigarettes    Start date: 06/09/1992   Smokeless tobacco: Never   Tobacco comments:    quit at age 38  Vaping Use   Vaping status: Never Used  Substance and Sexual Activity   Alcohol use: Not Currently    Alcohol/week: 1.0 standard drink of alcohol    Types: 1 Standard drinks or equivalent per week   Drug use: Not Currently    Types: Other-see comments   Sexual activity: Yes    Birth control/protection: Surgical  Other Topics Concern   Not on file  Social History Narrative   Not on file   Social Determinants of Health   Financial Resource Strain: Not on file  Food Insecurity: Not on file  Transportation Needs: Not on file  Physical Activity: Not on file  Stress: Not on file  Social Connections: Not on file     Family History: The patient's family history includes ALS in her sister; Breast cancer in her maternal aunt, mother, and another family member; Breast cancer (age of onset: 59) in her maternal grandmother; Heart attack in her father, maternal grandfather, and paternal grandfather; Heart disease in her father.  ROS:   Please see the history of present illness.    All other systems reviewed and are negative.  EKGs/Labs/Other Studies Reviewed:    The following studies were reviewed today: Stress Myoview scan 06/30/2022:   The study is normal. The study is low risk.   No ST deviation was noted.   Left ventricular function is normal. Nuclear stress EF: 69 %. The left ventricular ejection fraction is hyperdynamic (>65%). End diastolic cavity size is normal. EKG Interpretation Date/Time:  Thursday April 22 2023 15:36:13 EDT Ventricular Rate:  62 PR Interval:  132 QRS Duration:  72 QT Interval:  410 QTC Calculation: 416 R Axis:   39  Text Interpretation: Normal  sinus rhythm Low voltage QRS When compared with ECG of 29-Dec-2021 16:53, No significant change was found Confirmed by Tonny Bollman [phone]) on 04/22/2023 3:42:56 PM   Echocardiogram 06/30/2022: 1. Left ventricular ejection fraction, by estimation, is 60 to 65%. The  left ventricle has normal function. The left ventricle has no regional  wall motion abnormalities. Left ventricular diastolic parameters are  consistent with Grade I diastolic  dysfunction (impaired relaxation).   2. Right ventricular systolic function is normal. The right ventricular  size is normal. There is normal pulmonary artery systolic pressure.   3. No evidence of mitral valve regurgitation.   4. Aortic valve regurgitation is not visualized.   5. The inferior vena cava is normal in size with greater than 50%  respiratory variability, suggesting right atrial pressure of 3 mmHg.   Comparison(s): No significant change from prior study.   Recent Labs: 05/15/2022: BUN 37; Creatinine, Ser 1.37; Hemoglobin 14.1; Platelets 189; Potassium 5.1; Sodium 139  Recent Lipid Panel    Component Value Date/Time   CHOL 128 05/22/2019 0756   TRIG 121 05/22/2019 0756   HDL 58 05/22/2019 0756   CHOLHDL 2.2 05/22/2019 0756   LDLCALC 49 05/22/2019 0756     Risk Assessment/Calculations:                Physical Exam:    VS:  BP 122/70   Pulse 62   Ht 5\' 2"  (1.575 m)   Wt 154 lb 9.6 oz (70.1 kg)   LMP 08/17/1977 (Approximate)   SpO2 97%   BMI 28.28 kg/m     Wt Readings from Last 3 Encounters:  04/22/23 154 lb 9.6 oz (70.1 kg)  04/01/23 152 lb 12.8 oz (69.3 kg)  06/19/22 147 lb 9.6 oz (67 kg)     GEN:  Well nourished, well developed in no acute distress HEENT: Normal NECK: No JVD; No carotid bruits LYMPHATICS: No lymphadenopathy CARDIAC: RRR, no murmurs,  rubs, gallops RESPIRATORY:  Clear to auscultation without rales, wheezing or rhonchi  ABDOMEN: Soft, non-tender, non-distended MUSCULOSKELETAL:  No edema; No deformity  SKIN: Warm and dry NEUROLOGIC:  Alert and oriented x 3 PSYCHIATRIC:  Normal affect   ASSESSMENT:    1. Coronary artery disease of native artery of native heart with stable angina pectoris (HCC)   2. Stage 3b chronic kidney disease (HCC)   3. Essential hypertension   4. Hyperlipidemia LDL goal <70    PLAN:    In order of problems listed above:  The patient is clinically stable with respect to her coronary artery disease.  She remains on aspirin and clopidogrel for dual antiplatelet therapy.  She has had no bleeding problems.  She is having no anginal symptoms. Clinically stable with recent creatinine 1.33.  Continue current medical management. Blood pressure well-controlled on metoprolol succinate Treated with Zetia and rosuvastatin.  Cholesterol 147, LDL 40.  Continue current management. The patient is at low risk of cardiovascular complications from right shoulder replacement surgery.  She is having no anginal symptoms at her current workload which is greater than 4 metabolic equivalents and she had a nonischemic stress test within the past 12 months.  She can proceed with surgery without further cardiovascular testing.  She is an acceptable risk of holding aspirin for 7 days and clopidogrel for 5 days before surgery.     Once she recovers from surgery, I think it would be reasonable for her to be on antiplatelet monotherapy with clopidogrel alone.  This can be restarted  when Dr. Rennis Chris feels she is safe to resume antiplatelet therapy after surgery.  Medication Adjustments/Labs and Tests Ordered: Current medicines are reviewed at length with the patient today.  Concerns regarding medicines are outlined above.  Orders Placed This Encounter  Procedures   EKG 12-Lead   No orders of the defined types were placed in this  encounter.   There are no Patient Instructions on file for this visit.   Signed, Tonny Bollman, MD  04/22/2023 3:43 PM    [facility]

## 2023-04-22 NOTE — Telephone Encounter (Signed)
   Pre-operative Risk Assessment    Patient Name: Tracey [name]  DOB: 04/18/1946 MRN: [id number]    DATE OF LAST VISIT: 06/10/22 Thomasenia Bottoms, PAC DATE OF NEXT VISIT: 04/22/23 DR. Excell Seltzer  Request for Surgical Clearance    Procedure:   RIGHT REVERSE SHOULDER ARTHROPLASTY  Date of Surgery:  Clearance TBD                                 Surgeon:  DR. Caryn Bee SUPPLE Surgeon's Group or Practice Name:  [facility] Phone number:  [phone] ATTN: Aida Raider Fax number:  [phone]   Type of Clearance Requested:   - Medical ; PLAVIX    Type of Anesthesia:  General    Additional requests/questions:    Elpidio Anis   04/22/2023, 9:58 AM

## 2023-04-22 NOTE — Patient Instructions (Signed)
Medication Instructions:  Your physician recommends that you continue on your current medications as directed. Please refer to the Current Medication list given to you today.  *If you need a refill on your cardiac medications before your next appointment, please call your pharmacy*   Lab Work: NONE If you have labs (blood work) drawn today and your tests are completely normal, you will receive your results only by: MyChart Message (if you have MyChart) OR A paper copy in the mail If you have any lab test that is abnormal or we need to change your treatment, we will call you to review the results.   Testing/Procedures: **Cleared for upcoming procedure**   Follow-Up: At [facility], you and your health needs are our priority.  As part of our continuing mission to provide you with exceptional heart care, we have created designated Provider Care Teams.  These Care Teams include your primary Cardiologist (physician) and Advanced Practice Providers (APPs -  Physician Assistants and Nurse Practitioners) who all work together to provide you with the care you need, when you need it.  We recommend signing up for the patient portal called "MyChart".  Sign up information is provided on this After Visit Summary.  MyChart is used to connect with patients for Virtual Visits (Telemedicine).  Patients are able to view lab/test results, encounter notes, upcoming appointments, etc.  Non-urgent messages can be sent to your provider as well.   To learn more about what you can do with MyChart, go to [email or web address].    Your next appointment:   1 year(s)  Provider:   Tonny Bollman, MD

## 2023-04-29 NOTE — Patient Instructions (Addendum)
SURGICAL WAITING ROOM VISITATION  Patients having surgery or a procedure may have no more than 2 support people in the waiting area - these visitors may rotate.    Children under the age of 69 must have an adult with them who is not the patient.  Due to an increase in RSV and influenza rates and associated hospitalizations, children ages 46 and under may not visit patients in [facility] hospitals.  If the patient needs to stay at the hospital during part of their recovery, the visitor guidelines for inpatient rooms apply. Pre-op nurse will coordinate an appropriate time for 1 support person to accompany patient in pre-op.  This support person may not rotate.    Please refer to the [facility] website for the visitor guidelines for Inpatients (after your surgery is over and you are in a regular room).       Your procedure is scheduled on: 05/13/23   Report to [facility] Main Entrance    Report to admitting at  5:15AM   Call this number if you have problems the morning of surgery [phone]   Do not eat food :After Midnight.   After Midnight you may have the following liquids until 4:30 am DAY OF SURGERY  Water Non-Citrus Juices (without pulp, NO RED-Apple, White grape, White cranberry) Black Coffee (NO MILK/CREAM OR CREAMERS, sugar ok)  Clear Tea (NO MILK/CREAM OR CREAMERS, sugar ok) regular and decaf                             Plain Jell-O (NO RED)                                           Fruit ices (not with fruit pulp, NO RED)                                     Popsicles (NO RED)                                                               Sports drinks like Gatorade (NO RED)                  The day of surgery:  Drink ONE (1) Pre-Surgery Clear Ensure at 4:30 AM the morning of surgery. Drink in one sitting. Do not sip.  This drink was given to you during your hospital  pre-op appointment visit. Nothing else to drink after completing the  Pre-Surgery Clear  Ensure     Oral Hygiene is also important to reduce your risk of infection.                                    Remember - BRUSH YOUR TEETH THE MORNING OF SURGERY WITH YOUR REGULAR TOOTHPASTE   Stop all vitamins and herbal supplements 7 days before surgery.   Take these medicines the morning of surgery: Allopurinol, Ezetimibe, Plaquenil, Loratadine, Metoprolol,Prednisone,Rosuvastatin,Zoloft  Nasal spray  Bring CPAP mask and  tubing day of surgery.                              You may not have any metal on your body including hair pins, jewelry, and body piercing             Do not wear make-up, lotions, powders, perfumes/cologne, or deodorant  Do not wear nail polish including gel and S&S, artificial/acrylic nails, or any other type of covering on natural nails including finger and toenails. If you have artificial nails, gel coating, etc. that needs to be removed by a nail salon please have this removed prior to surgery or surgery may need to be canceled/ delayed if the surgeon/ anesthesia feels like they are unable to be safely monitored.   Do not shave  48 hours prior to surgery.    Do not bring valuables to the hospital. [facility] IS NOT             RESPONSIBLE   FOR VALUABLES.   Contacts, glasses, dentures or bridgework may not be worn into surgery.  DO NOT BRING YOUR HOME MEDICATIONS TO THE HOSPITAL. PHARMACY WILL DISPENSE MEDICATIONS LISTED ON YOUR MEDICATION LIST TO YOU DURING YOUR ADMISSION IN THE HOSPITAL!    Patients discharged on the day of surgery will not be allowed to drive home.  Someone NEEDS to stay with you for the first 24 hours after anesthesia.   Special Instructions: Bring a copy of your healthcare power of attorney and living will documents the day of surgery if you haven't scanned them before.              Please read over the following fact sheets you were given: IF YOU HAVE QUESTIONS ABOUT YOUR PRE-OP INSTRUCTIONS PLEASE CALL [phone]  Rosey [facility]   If you  received a COVID test during your pre-op visit  it is requested that you wear a mask when out in public, stay away from anyone that may not be feeling well and notify your surgeon if you develop symptoms. If you test positive for Covid or have been in contact with anyone that has tested positive in the last 10 days please notify you surgeon.      Pre-operative 5 CHG Bath Instructions   You can play a key role in reducing the risk of infection after surgery. Your skin needs to be as free of germs as possible. You can reduce the number of germs on your skin by washing with CHG (chlorhexidine gluconate) soap before surgery. CHG is an antiseptic soap that kills germs and continues to kill germs even after washing.   DO NOT use if you have an allergy to chlorhexidine/CHG or antibacterial soaps. If your skin becomes reddened or irritated, stop using the CHG and notify one of our RNs at [phone].   Please shower with the CHG soap starting 4 days before surgery using the following schedule:     Please keep in mind the following:  DO NOT shave, including legs and underarms, starting the day of your first shower.   You may shave your face at any point before/day of surgery.  Place clean sheets on your bed the day you start using CHG soap. Use a clean washcloth (not used since being washed) for each shower. DO NOT sleep with pets once you start using the CHG.   CHG Shower Instructions:  If you choose to wash your hair and private area,  wash first with your normal shampoo/soap.  After you use shampoo/soap, rinse your hair and body thoroughly to remove shampoo/soap residue.  Turn the water OFF and apply about 3 tablespoons (45 ml) of CHG soap to a CLEAN washcloth.  Apply CHG soap ONLY FROM YOUR NECK DOWN TO YOUR TOES (washing for 3-5 minutes)  DO NOT use CHG soap on face, private areas, open wounds, or sores.  Pay special attention to the area where your surgery is being performed.  If you are  having back surgery, having someone wash your back for you may be helpful. Wait 2 minutes after CHG soap is applied, then you may rinse off the CHG soap.  Pat dry with a clean towel  Put on clean clothes/pajamas   If you choose to wear lotion, please use ONLY the CHG-compatible lotions on the back of this paper.     Additional instructions for the day of surgery: DO NOT APPLY any lotions, deodorants, cologne, or perfumes.   Put on clean/comfortable clothes.  Brush your teeth.  Ask your nurse before applying any prescription medications to the skin.      CHG Compatible Lotions   Aveeno Moisturizing lotion  Cetaphil Moisturizing Cream  Cetaphil Moisturizing Lotion  Clairol Herbal Essence Moisturizing Lotion, Dry Skin  Clairol Herbal Essence Moisturizing Lotion, Extra Dry Skin  Clairol Herbal Essence Moisturizing Lotion, Normal Skin  Curel Age Defying Therapeutic Moisturizing Lotion with Alpha Hydroxy  Curel Extreme Care Body Lotion  Curel Soothing Hands Moisturizing Hand Lotion  Curel Therapeutic Moisturizing Cream, Fragrance-Free  Curel Therapeutic Moisturizing Lotion, Fragrance-Free  Curel Therapeutic Moisturizing Lotion, Original Formula  Eucerin Daily Replenishing Lotion  Eucerin Dry Skin Therapy Plus Alpha Hydroxy Crme  Eucerin Dry Skin Therapy Plus Alpha Hydroxy Lotion  Eucerin Original Crme  Eucerin Original Lotion  Eucerin Plus Crme Eucerin Plus Lotion  Eucerin TriLipid Replenishing Lotion  Keri Anti-Bacterial Hand Lotion  Keri Deep Conditioning Original Lotion Dry Skin Formula Softly Scented  Keri Deep Conditioning Original Lotion, Fragrance Free Sensitive Skin Formula  Keri Lotion Fast Absorbing Fragrance Free Sensitive Skin Formula  Keri Lotion Fast Absorbing Softly Scented Dry Skin Formula  Keri Original Lotion  Keri Skin Renewal Lotion Keri Silky Smooth Lotion  Keri Silky Smooth Sensitive Skin Lotion  Nivea Body Creamy Conditioning Oil  Nivea Body Extra  Enriched Lotion  Nivea Body Original Lotion  Nivea Body Sheer Moisturizing Lotion Nivea Crme  Nivea Skin Firming Lotion  NutraDerm 30 Skin Lotion  NutraDerm Skin Lotion  NutraDerm Therapeutic Skin Cream  NutraDerm Therapeutic Skin Lotion  ProShield Protective Hand Cream    Incentive Spirometer (Watch this video at home: [email or web address])  An incentive spirometer is a tool that can help keep your lungs clear and active. This tool measures how well you are filling your lungs with each breath. Taking long deep breaths may help reverse or decrease the chance of developing breathing (pulmonary) problems (especially infection) following: A long period of time when you are unable to move or be active. BEFORE THE PROCEDURE  If the spirometer includes an indicator to show your best effort, your nurse or respiratory therapist will set it to a desired goal. If possible, sit up straight or lean slightly forward. Try not to slouch. Hold the incentive spirometer in an upright position. INSTRUCTIONS FOR USE  Sit on the edge of your bed if possible, or sit up as far as you can in bed or on a chair. Hold the incentive spirometer in an upright position.  Breathe out normally. Place the mouthpiece in your mouth and seal your lips tightly around it. Breathe in slowly and as deeply as possible, raising the piston or the ball toward the top of the column. Hold your breath for 3-5 seconds or for as long as possible. Allow the piston or ball to fall to the bottom of the column. Remove the mouthpiece from your mouth and breathe out normally. Rest for a few seconds and repeat Steps 1 through 7 at least 10 times every 1-2 hours when you are awake. Take your time and take a few normal breaths between deep breaths. The spirometer may include an indicator to show your best effort. Use the indicator as a goal to work toward during each repetition. After each set of 10 deep breaths, practice  coughing to be sure your lungs are clear. If you have an incision (the cut made at the time of surgery), support your incision when coughing by placing a pillow or rolled up towels firmly against it. Once you are able to get out of bed, walk around indoors and cough well. You may stop using the incentive spirometer when instructed by your caregiver.  RISKS AND COMPLICATIONS Take your time so you do not get dizzy or light-headed. If you are in pain, you may need to take or ask for pain medication before doing incentive spirometry. It is harder to take a deep breath if you are having pain. AFTER USE Rest and breathe slowly and easily. It can be helpful to keep track of a log of your progress. Your caregiver can provide you with a simple table to help with this. If you are using the spirometer at home, follow these instructions: SEEK MEDICAL CARE IF:  You are having difficultly using the spirometer. You have trouble using the spirometer as often as instructed. Your pain medication is not giving enough relief while using the spirometer. You develop fever of 100.5 F (38.1 C) or higher. SEEK IMMEDIATE MEDICAL CARE IF:  You cough up bloody sputum that had not been present before. You develop fever of 102 F (38.9 C) or greater. You develop worsening pain at or near the incision site. MAKE SURE YOU:  Understand these instructions. Will watch your condition. Will get help right away if you are not doing well or get worse. Document Released: 12/14/2006 Document Revised: 10/26/2011 Document Reviewed: 02/14/2007   .[facility]- Preparing for Total Shoulder Arthroplasty    Before surgery, you can play an important role. Because skin is not sterile, your skin needs to be as free of germs as possible. You can reduce the number of germs on your skin by using the following products. Benzoyl Peroxide Gel Reduces the number of germs present on the skin Applied twice a day to shoulder area starting two  days before surgery    ==================================================================  Please follow these instructions carefully:  BENZOYL PEROXIDE 5% GEL  Please do not use if you have an allergy to benzoyl peroxide.   If your skin becomes reddened/irritated stop using the benzoyl peroxide.  Starting two days before surgery, apply as follows: Apply benzoyl peroxide in the morning and at night. Apply after taking a shower. If you are not taking a shower clean entire shoulder front, back, and side along with the armpit with a clean wet washcloth.  Place a quarter-sized dollop on your shoulder and rub in thoroughly, making sure to cover the front, back, and side of your shoulder, along with the armpit.  2 days before ____ AM   ____ PM              1 day before ____ AM   ____ PM                         Do this twice a day for two days.  (Last application is the night before surgery, AFTER using the CHG soap as described below).  Do NOT apply benzoyl peroxide gel on the day of surgery.

## 2023-04-29 NOTE — Progress Notes (Addendum)
COVID Vaccine received:  []  No [x]  Yes Date of any COVID positive Test in last 90 days: no PCP - Alysia Penna MD Cardiologist - Tonny Bollman MD  Chest x-ray - 06/19/22 Epic EKG -  04/22/23 Epic Stress Test - 06/30/22 Epic ECHO - 06/30/22 Epic Cardiac Cath - 09/15/17 Epic  Cardiac Clearance 04/22/23- Dr. Excell Seltzer Medical Clearance 04/26/23- Dr. Caprice Kluver  Bowel Prep - [x]  No  []   Yes ______  Pacemaker / ICD device [x]  No []  Yes   Spinal Cord Stimulator:[x]  No []  Yes       History of Sleep Apnea? []  No [x]  Yes   CPAP used?- []  No [x]  Yes    Does the patient monitor blood sugar?          [x]  No []  Yes  []  N/A  Patient has: [x]  NO Hx DM   []  Pre-DM                 []  DM1  []   DM2 Does patient have a [employer] or [employer]? []  No []  Yes   Fasting Blood Sugar Ranges-  Checks Blood Sugar _____ times a day  GLP1 agonist / usual dose - no GLP1 instructions:  SGLT-2 inhibitors / usual dose - no SGLT-2 instructions:   Blood Thinner / Instructions:Plavix daily -Hold x5 days prior to surgery per Dr. Excell Seltzer. Last dose to be 9/20 Aspirin Instructions:Hold ASA x7 days prior to surgery per Dr. Excell Seltzer. Last dose to be 9/18  Comments:   Activity level: Patient is able  to climb a flight of stairs without difficulty; [x]  No CP  [x]  No SOB,    Patient can perform ADLs without assistance.   Anesthesia review: HTN,CAD,OSA,CKD3, MI w/ stents  Patient denies shortness of breath, fever, cough and chest pain at PAT appointment.  Patient verbalized understanding and agreement to the Pre-Surgical Instructions that were given to them at this PAT appointment. Patient was also educated of the need to review these PAT instructions again prior to his/her surgery.I reviewed the appropriate phone numbers to call if they have any and questions or concerns.

## 2023-04-30 ENCOUNTER — Encounter (HOSPITAL_COMMUNITY)
Admission: RE | Admit: 2023-04-30 | Discharge: 2023-04-30 | Disposition: A | Discharge status: Home / Self Care | Payer: Medicare Other | Source: Ambulatory Visit | Attending: Orthopedic Surgery | Admitting: Orthopedic Surgery

## 2023-04-30 ENCOUNTER — Other Ambulatory Visit: Payer: Self-pay

## 2023-04-30 ENCOUNTER — Encounter (HOSPITAL_COMMUNITY): Payer: Self-pay

## 2023-04-30 VITALS — BP 143/79 | HR 80 | Temp 98.1°F | Resp 16 | Ht 62.0 in | Wt 148.0 lb

## 2023-04-30 DIAGNOSIS — Z955 Presence of coronary angioplasty implant and graft: Secondary | ICD-10-CM | POA: Insufficient documentation

## 2023-04-30 DIAGNOSIS — Z01818 Encounter for other preprocedural examination: Secondary | ICD-10-CM

## 2023-04-30 DIAGNOSIS — Z7982 Long term (current) use of aspirin: Secondary | ICD-10-CM | POA: Diagnosis not present

## 2023-04-30 DIAGNOSIS — M75101 Unspecified rotator cuff tear or rupture of right shoulder, not specified as traumatic: Secondary | ICD-10-CM | POA: Insufficient documentation

## 2023-04-30 DIAGNOSIS — I10 Essential (primary) hypertension: Secondary | ICD-10-CM | POA: Insufficient documentation

## 2023-04-30 DIAGNOSIS — Z01812 Encounter for preprocedural laboratory examination: Secondary | ICD-10-CM | POA: Insufficient documentation

## 2023-04-30 DIAGNOSIS — Z79899 Other long term (current) drug therapy: Secondary | ICD-10-CM | POA: Insufficient documentation

## 2023-04-30 DIAGNOSIS — Z87891 Personal history of nicotine dependence: Secondary | ICD-10-CM | POA: Insufficient documentation

## 2023-04-30 DIAGNOSIS — I2510 Atherosclerotic heart disease of native coronary artery without angina pectoris: Secondary | ICD-10-CM | POA: Insufficient documentation

## 2023-04-30 LAB — CBC
HCT: 43.4 % (ref 36.0–46.0)
Hemoglobin: 13.6 g/dL (ref 12.0–15.0)
MCH: 30.2 pg (ref 26.0–34.0)
MCHC: 31.3 g/dL (ref 30.0–36.0)
MCV: 96.2 fL (ref 80.0–100.0)
Platelets: 172 10*3/uL (ref 150–400)
RBC: 4.51 MIL/uL (ref 3.87–5.11)
RDW: 13.9 % (ref 11.5–15.5)
WBC: 7.8 10*3/uL (ref 4.0–10.5)
nRBC: 0 % (ref 0.0–0.2)

## 2023-04-30 LAB — BASIC METABOLIC PANEL
Anion gap: 11 (ref 5–15)
BUN: 27 mg/dL — ABNORMAL HIGH (ref 8–23)
CO2: 24 mmol/L (ref 22–32)
Calcium: 9 mg/dL (ref 8.9–10.3)
Chloride: 102 mmol/L (ref 98–111)
Creatinine, Ser: 1.22 mg/dL — ABNORMAL HIGH (ref 0.44–1.00)
GFR, Estimated: 45 mL/min — ABNORMAL LOW (ref >60)
Glucose, Bld: 92 mg/dL (ref 70–99)
Potassium: 3.4 mmol/L — ABNORMAL LOW (ref 3.5–5.1)
Sodium: 137 mmol/L (ref 135–145)

## 2023-04-30 LAB — SURGICAL PCR SCREEN
MRSA, PCR: NEGATIVE
Staphylococcus aureus: NEGATIVE

## 2023-05-04 NOTE — Progress Notes (Signed)
Anesthesia Chart Review   Case: [id number] Date/Time: 05/13/23 0715   Procedure: REVERSE SHOULDER ARTHROPLASTY (Right: Shoulder)   Anesthesia type: General   Pre-op diagnosis: Right shoulder rotator cuff tear arthropathy   Location: [facility] ROOM 06 / [facility]   Surgeons: Francena Hanly, MD       DISCUSSION:77 y.o. former smoker with h/o PONV, CAD s/p DES to LAD, right shoulder rotator cuff tear scheduled for above procedure 05/13/2023 with Dr. Francena Hanly.   Pt last seen by cardiology 04/22/2023. Per OV note, "The patient is at low risk of cardiovascular complications from right shoulder replacement surgery. She is having no anginal symptoms at her current workload which is greater than 4 metabolic equivalents and she had a nonischemic stress test within the past 12 months. She can proceed with surgery without further cardiovascular testing. She is an acceptable risk of holding aspirin for 7 days and clopidogrel for 5 days before surgery. "  VS: BP (!) 143/79   Pulse 80   Temp 36.7 C (Oral)   Resp 16   Ht 5\' 2"  (1.575 m)   Wt 67.1 kg   LMP 08/17/1977 (Approximate)   SpO2 100%   BMI 27.07 kg/m   PROVIDERS: Alysia Penna, MD is PCP   Cardiologist - Tonny Bollman MD   LABS: Labs reviewed: Acceptable for surgery. (all labs ordered are listed, but only abnormal results are displayed)  Labs Reviewed  BASIC METABOLIC PANEL - Abnormal; Notable for the following components:      Result Value   Potassium 3.4 (*)    BUN 27 (*)    Creatinine, Ser 1.22 (*)    GFR, Estimated 45 (*)    All other components within normal limits  SURGICAL PCR SCREEN  CBC     IMAGES:   EKG:   CV: Myocardial Perfusion 06/30/2022   The study is normal. The study is low risk.   No ST deviation was noted.   Left ventricular function is normal. Nuclear stress EF: 69 %. The left ventricular ejection fraction is hyperdynamic (>65%). End diastolic cavity size is normal.   Echo 06/30/2022 1. Left  ventricular ejection fraction, by estimation, is 60 to 65%. The  left ventricle has normal function. The left ventricle has no regional  wall motion abnormalities. Left ventricular diastolic parameters are  consistent with Grade I diastolic  dysfunction (impaired relaxation).   2. Right ventricular systolic function is normal. The right ventricular  size is normal. There is normal pulmonary artery systolic pressure.   3. No evidence of mitral valve regurgitation.   4. Aortic valve regurgitation is not visualized.   5. The inferior vena cava is normal in size with greater than 50%  respiratory variability, suggesting right atrial pressure of 3 mmHg.   Comparison(s): No significant change from prior study.  Past Medical History:  Diagnosis Date   Allergy    Anxiety    takes Ativan daily prn   Arthritis    Breast cancer (HCC) 2014   ER+/PR+/HEr2-,    Bruises easily    Colon polyps    2 polyps by report   Coronary artery disease involving native coronary artery of native heart without angina pectoris 01/01/2014   NSTEMI in January 2019 in [place]>> s/p DES x2 to the LAD (procedure complicated by stent thrombosis) Myoview 05/2020: EF 72, no infarct or ischemia; low risk Echocardiogram 05/2019: EF 60-65, GLS -21.5 Cardiac catheterization at [facility] in 08/2017: Normal left main, normal LCx,  normal RCA; LAD/diagonal 95%>> PCI    Diverticulosis    GERD (gastroesophageal reflux disease)    occasionally takes Nexium    Gout    Headache    MIgraines- no headaches- has floater in eyes   History of bladder infections    many yrs ago   History of bronchitis    last time at least 73yrs ago   History of hiatal hernia    History of stress incontinence    Hyperlipidemia    takes Pravastatin daily   Hypertension    Insomnia    takes Ambien nightly prn   NSTEMI (non-ST elevated myocardial infarction) (HCC) 08/2017   OSA (obstructive sleep apnea)    on CPAP    Osteoarthritis    Peripheral edema    takes Furosemide daily prn   PONV (postoperative nausea and vomiting)    Postmenopausal hormone therapy    Radiation 07/27/13-09/07/13   Right Breast x 31 treatments   Rhinitis    uses Flonase prn   Sinus congestion    Status post breast reconstruction 10/15/2014   Bilateral implant removal and DIEP performed in [place], [place]   Tachycardia    takes Metoprolol daily   Ventricular tachycardia, non-sustained (HCC)    during sleep study 2009 with normal cardiac workup    Past Surgical History:  Procedure Laterality Date   ABDOMINAL HYSTERECTOMY     with BSO   APPENDECTOMY     ARTERY BIOPSY Left 05/15/2022   Procedure: BIOPSY TEMPORAL ARTERY;  Surgeon: Cephus Shelling, MD;  Location: [facility] OR;  Service: Vascular;  Laterality: Left;   AXILLARY SENTINEL NODE BIOPSY Right 05/23/2013   Procedure: AXILLARY SENTINEL NODE BIOPSY;  Surgeon: Adolph Pollack, MD;  Location: [facility] OR;  Service: General;  Laterality: Right;  nuc med injection 7:00   BREAST RECONSTRUCTION WITH PLACEMENT OF TISSUE EXPANDER AND FLEX HD (ACELLULAR HYDRATED DERMIS) Bilateral 05/23/2013   Procedure: BILATERAL BREAST RECONSTRUCTION WITH PLACEMENT OF TISSUE EXPANDER AND FLEX HD;  Surgeon: Etter Sjogren, MD;  Location[facility] OR;  Service: Plastics;  Laterality: Bilateral;   CARDIAC CATHETERIZATION  1999   CATARACT EXTRACTION, BILATERAL Bilateral    CHOLECYSTECTOMY     COSMETIC SURGERY     OTHER SURGICAL HISTORY  09/2017   had cyst removal from spine    PERCUTANEOUS CORONARY STENT INTERVENTION (PCI-S)  08/2017   done in [place]     REMOVAL OF BILATERAL TISSUE EXPANDERS WITH PLACEMENT OF BILATERAL BREAST IMPLANTS Bilateral 05/17/2014   Procedure: REMOVAL OF BILATERAL TISSUE EXPANDERS WITH PLACEMENT OF BILATERAL BREAST IMPLANTS FOR RECONSTRUCTION;  Surgeon: Etter Sjogren, MD;  Location: [facility] OR;  Service: Plastics;  Laterality: Bilateral;   TONSILLECTOMY     TOTAL KNEE ARTHROPLASTY Right  07/26/2018   Procedure: TOTAL KNEE ARTHROPLASTY;  Surgeon: Durene Romans, MD;  Location: [facility] ORS;  Service: Orthopedics;  Laterality: Right;  70 mins   TOTAL KNEE ARTHROPLASTY Left 12/02/2021   Procedure: TOTAL KNEE ARTHROPLASTY;  Surgeon: Durene Romans, MD;  Location: [facility];  Service: Orthopedics;  Laterality: Left;   TOTAL MASTECTOMY Bilateral 05/23/2013   Procedure: TOTAL MASTECTOMY;  Surgeon: Adolph Pollack, MD;  Location: [facility] OR;  Service: General;  Laterality: Bilateral;    MEDICATIONS:  allopurinol (ZYLOPRIM) 100 MG tablet   ASPIRIN LOW DOSE 81 MG chewable tablet   Budeson-Glycopyrrol-Formoterol (BREZTRI AEROSPHERE) 160-9-4.8 MCG/ACT AERO   budesonide-formoterol (SYMBICORT) 160-4.5 MCG/ACT inhaler   Cholecalciferol (VITAMIN D3) 50 MCG (2000 UT) TABS   clopidogrel (PLAVIX) 75 MG  tablet   cyanocobalamin (,VITAMIN B-12,) 1000 MCG/ML injection   ezetimibe (ZETIA) 10 MG tablet   fluticasone (FLONASE) 50 MCG/ACT nasal spray   folic acid (FOLVITE) 1 MG tablet   furosemide (LASIX) 40 MG tablet   hydroxychloroquine (PLAQUENIL) 200 MG tablet   loratadine (CLARITIN) 10 MG tablet   methocarbamol (ROBAXIN) 500 MG tablet   metoprolol succinate (TOPROL-XL) 25 MG 24 hr tablet   Multiple Vitamin (MULTI-VITAMIN) tablet   Multiple Vitamins-Minerals (HAIR/SKIN/NAILS) CAPS   nitroGLYCERIN (NITROSTAT) 0.4 MG SL tablet   NONFORMULARY OR COMPOUNDED ITEM   pantoprazole (PROTONIX) 40 MG tablet   polyethylene glycol (MIRALAX / GLYCOLAX) 17 g packet   predniSONE (DELTASONE) 1 MG tablet   predniSONE (DELTASONE) 5 MG tablet   Propylene Glycol (SYSTANE COMPLETE) 0.6 % SOLN   rosuvastatin (CRESTOR) 20 MG tablet   sertraline (ZOLOFT) 100 MG tablet   testosterone cypionate (DEPOTESTOSTERONE CYPIONATE) 200 MG/ML injection   traMADol (ULTRAM) 50 MG tablet   No current facility-administered medications for this encounter.   Jodell Cipro Ward, PA-C [facility] Pre-Surgical Testing [phone]

## 2023-05-11 ENCOUNTER — Other Ambulatory Visit: Payer: Self-pay | Admitting: Cardiovascular Disease

## 2023-05-11 DIAGNOSIS — K219 Gastro-esophageal reflux disease without esophagitis: Secondary | ICD-10-CM

## 2023-05-12 NOTE — Anesthesia Preprocedure Evaluation (Addendum)
Anesthesia Evaluation  Patient identified by MRN, date of birth, ID band Patient awake    Reviewed: Allergy & Precautions, NPO status , Patient's Chart, lab work & pertinent test results  History of Anesthesia Complications (+) PONV and history of anesthetic complications  Airway Mallampati: IV  TM Distance: >3 FB Neck ROM: Full  Mouth opening: Limited Mouth Opening  Dental  (+) Dental Advisory Given   Pulmonary neg shortness of breath, sleep apnea and Continuous Positive Airway Pressure Ventilation , neg COPD, neg recent URI, former smoker Uses home oxygen in the winter when they are in [place].   Pulmonary exam normal breath sounds clear to auscultation       Cardiovascular hypertension (fursoemide, metoprolol), Pt. on home beta blockers (-) angina + CAD, + Past MI (NSTEMI 08/2017) and + Cardiac Stents (DES x2 to LAD)  (-) CABG + dysrhythmias (NSVT)  Rhythm:Regular Rate:Normal  HLD  Normal stress test 06/30/2022  TTE 06/30/2022: IMPRESSIONS    1. Left ventricular ejection fraction, by estimation, is 60 to 65%. The  left ventricle has normal function. The left ventricle has no regional  wall motion abnormalities. Left ventricular diastolic parameters are  consistent with Grade I diastolic  dysfunction (impaired relaxation).   2. Right ventricular systolic function is normal. The right ventricular  size is normal. There is normal pulmonary artery systolic pressure.   3. No evidence of mitral valve regurgitation.   4. Aortic valve regurgitation is not visualized.   5. The inferior vena cava is normal in size with greater than 50%  respiratory variability, suggesting right atrial pressure of 3 mmHg.     Neuro/Psych  Headaches, neg Seizures PSYCHIATRIC DISORDERS Anxiety        GI/Hepatic Neg liver ROS, hiatal hernia,GERD  Medicated,,diverticulosis   Endo/Other  negative endocrine ROS    Renal/GU CRFRenal disease      Musculoskeletal  (+) Arthritis , Osteoarthritis,    Abdominal   Peds  Hematology negative hematology ROS (+) Lab Results      Component                Value               Date                      WBC                      7.8                 04/30/2023                HGB                      13.6                04/30/2023                HCT                      43.4                04/30/2023                MCV                      96.2  04/30/2023                PLT                      172                 04/30/2023              Anesthesia Other Findings Last Plavix: 5-6 days ago  Reproductive/Obstetrics H/o breast cancer 2014                             Anesthesia Physical Anesthesia Plan  ASA: 3  Anesthesia Plan: General   Post-op Pain Management: Regional block* and Tylenol PO (pre-op)*   Induction: Intravenous  PONV Risk Score and Plan: 4 or greater and Ondansetron, Dexamethasone and Treatment may vary due to age or medical condition  Airway Management Planned: Oral ETT and Video Laryngoscope Planned  Additional Equipment:   Intra-op Plan:   Post-operative Plan: Extubation in OR  Informed Consent: I have reviewed the patients History and Physical, chart, labs and discussed the procedure including the risks, benefits and alternatives for the proposed anesthesia with the patient or authorized representative who has indicated his/her understanding and acceptance.     Dental advisory given  Plan Discussed with: CRNA and Anesthesiologist  Anesthesia Plan Comments: (Discussed potential risks of nerve blocks including, but not limited to, infection, bleeding, nerve damage, seizures, pneumothorax, respiratory depression, and potential failure of the block. Alternatives to nerve blocks discussed. All questions answered.  Risks of general anesthesia discussed including, but not limited to, sore throat, hoarse voice,  chipped/damaged teeth, injury to vocal cords, nausea and vomiting, allergic reactions, lung infection, heart attack, stroke, and death. All questions answered. )        Anesthesia Quick Evaluation

## 2023-05-13 ENCOUNTER — Observation Stay (HOSPITAL_COMMUNITY)
Admission: RE | Admit: 2023-05-13 | Discharge: 2023-05-14 | Disposition: A | Discharge status: Home / Self Care | Payer: Medicare Other | Source: Ambulatory Visit | Attending: Orthopedic Surgery | Admitting: Orthopedic Surgery

## 2023-05-13 ENCOUNTER — Ambulatory Visit (HOSPITAL_BASED_OUTPATIENT_CLINIC_OR_DEPARTMENT_OTHER): Payer: Medicare Other | Admitting: Anesthesiology

## 2023-05-13 ENCOUNTER — Other Ambulatory Visit: Payer: Self-pay

## 2023-05-13 ENCOUNTER — Encounter (HOSPITAL_COMMUNITY): Payer: Self-pay | Admitting: Orthopedic Surgery

## 2023-05-13 ENCOUNTER — Ambulatory Visit (HOSPITAL_COMMUNITY): Payer: Medicare Other | Admitting: Physician Assistant

## 2023-05-13 ENCOUNTER — Encounter (HOSPITAL_COMMUNITY)
Admission: RE | Disposition: A | Discharge status: Home / Self Care | Payer: Self-pay | Source: Ambulatory Visit | Attending: Orthopedic Surgery

## 2023-05-13 DIAGNOSIS — Z87891 Personal history of nicotine dependence: Secondary | ICD-10-CM | POA: Insufficient documentation

## 2023-05-13 DIAGNOSIS — Z96653 Presence of artificial knee joint, bilateral: Secondary | ICD-10-CM | POA: Diagnosis not present

## 2023-05-13 DIAGNOSIS — G4733 Obstructive sleep apnea (adult) (pediatric): Secondary | ICD-10-CM | POA: Insufficient documentation

## 2023-05-13 DIAGNOSIS — Z853 Personal history of malignant neoplasm of breast: Secondary | ICD-10-CM | POA: Diagnosis not present

## 2023-05-13 DIAGNOSIS — Z96611 Presence of right artificial shoulder joint: Principal | ICD-10-CM

## 2023-05-13 DIAGNOSIS — I10 Essential (primary) hypertension: Secondary | ICD-10-CM | POA: Insufficient documentation

## 2023-05-13 DIAGNOSIS — M75101 Unspecified rotator cuff tear or rupture of right shoulder, not specified as traumatic: Secondary | ICD-10-CM | POA: Diagnosis not present

## 2023-05-13 DIAGNOSIS — M12811 Other specific arthropathies, not elsewhere classified, right shoulder: Secondary | ICD-10-CM | POA: Diagnosis not present

## 2023-05-13 DIAGNOSIS — M19011 Primary osteoarthritis, right shoulder: Secondary | ICD-10-CM | POA: Diagnosis not present

## 2023-05-13 DIAGNOSIS — I2510 Atherosclerotic heart disease of native coronary artery without angina pectoris: Secondary | ICD-10-CM | POA: Diagnosis not present

## 2023-05-13 DIAGNOSIS — G8918 Other acute postprocedural pain: Secondary | ICD-10-CM | POA: Diagnosis not present

## 2023-05-13 DIAGNOSIS — I25119 Atherosclerotic heart disease of native coronary artery with unspecified angina pectoris: Secondary | ICD-10-CM | POA: Diagnosis not present

## 2023-05-13 DIAGNOSIS — Z955 Presence of coronary angioplasty implant and graft: Secondary | ICD-10-CM | POA: Diagnosis not present

## 2023-05-13 HISTORY — PX: REVERSE SHOULDER ARTHROPLASTY: SHX5054

## 2023-05-13 SURGERY — ARTHROPLASTY, SHOULDER, TOTAL, REVERSE
Anesthesia: General | Site: Shoulder | Laterality: Right

## 2023-05-13 MED ORDER — PHENYLEPHRINE HCL (PRESSORS) 10 MG/ML IV SOLN
INTRAVENOUS | Status: DC | PRN
  Administered 2023-05-13: 120 ug via INTRAVENOUS

## 2023-05-13 MED ORDER — ROCURONIUM BROMIDE 10 MG/ML (PF) SYRINGE
PREFILLED_SYRINGE | INTRAVENOUS | Status: AC
  Filled 2023-05-13: qty 10

## 2023-05-13 MED ORDER — PROPOFOL 10 MG/ML IV BOLUS
INTRAVENOUS | Status: DC | PRN
  Administered 2023-05-13: 50 mg via INTRAVENOUS
  Administered 2023-05-13: 100 mg via INTRAVENOUS

## 2023-05-13 MED ORDER — EZETIMIBE 10 MG PO TABS
10.0000 mg | ORAL_TABLET | Freq: Every day | ORAL | Status: DC
  Administered 2023-05-14: 10 mg via ORAL
  Filled 2023-05-13: qty 1

## 2023-05-13 MED ORDER — ALUM & MAG HYDROXIDE-SIMETH 200-200-20 MG/5ML PO SUSP: 30.0000 mL | ORAL | Status: DC | PRN

## 2023-05-13 MED ORDER — DEXAMETHASONE SODIUM PHOSPHATE 10 MG/ML IJ SOLN
INTRAMUSCULAR | Status: AC
  Filled 2023-05-13: qty 1

## 2023-05-13 MED ORDER — ONDANSETRON HCL 4 MG/2ML IJ SOLN
INTRAMUSCULAR | Status: DC | PRN
  Administered 2023-05-13: 4 mg via INTRAVENOUS

## 2023-05-13 MED ORDER — SERTRALINE HCL 50 MG PO TABS
50.0000 mg | ORAL_TABLET | Freq: Every day | ORAL | Status: DC
  Administered 2023-05-14: 50 mg via ORAL
  Filled 2023-05-13: qty 1

## 2023-05-13 MED ORDER — TRANEXAMIC ACID 1000 MG/10ML IV SOLN: 1000.0000 mg | INTRAVENOUS | Status: DC

## 2023-05-13 MED ORDER — PHENYLEPHRINE 80 MCG/ML (10ML) SYRINGE FOR IV PUSH (FOR BLOOD PRESSURE SUPPORT)
PREFILLED_SYRINGE | INTRAVENOUS | Status: AC
  Filled 2023-05-13: qty 10

## 2023-05-13 MED ORDER — ONDANSETRON HCL 4 MG/2ML IJ SOLN
INTRAMUSCULAR | Status: AC
  Filled 2023-05-13: qty 2

## 2023-05-13 MED ORDER — FLUTICASONE PROPIONATE 50 MCG/ACT NA SUSP
1.0000 | Freq: Every day | NASAL | Status: DC
  Filled 2023-05-13: qty 16

## 2023-05-13 MED ORDER — GLYCOPYRROLATE 0.2 MG/ML IJ SOLN
INTRAMUSCULAR | Status: AC
  Filled 2023-05-13: qty 1

## 2023-05-13 MED ORDER — 0.9 % SODIUM CHLORIDE (POUR BTL) OPTIME
TOPICAL | Status: DC | PRN
  Administered 2023-05-13: 1000 mL

## 2023-05-13 MED ORDER — NITROGLYCERIN 0.4 MG SL SUBL: 0.4000 mg | SUBLINGUAL_TABLET | SUBLINGUAL | Status: DC | PRN

## 2023-05-13 MED ORDER — FENTANYL CITRATE PF 50 MCG/ML IJ SOSY
25.0000 ug | PREFILLED_SYRINGE | INTRAMUSCULAR | Status: DC | PRN
  Administered 2023-05-13 (×2): 50 ug via INTRAVENOUS

## 2023-05-13 MED ORDER — AMISULPRIDE (ANTIEMETIC) 5 MG/2ML IV SOLN: 10.0000 mg | Freq: Once | INTRAVENOUS | Status: DC | PRN

## 2023-05-13 MED ORDER — PREDNISONE 5 MG PO TABS: 5.0000 mg | ORAL_TABLET | Freq: Every day | ORAL | Status: DC

## 2023-05-13 MED ORDER — PHENYLEPHRINE HCL-NACL 20-0.9 MG/250ML-% IV SOLN
INTRAVENOUS | Status: DC | PRN
  Administered 2023-05-13: 30 ug/min via INTRAVENOUS

## 2023-05-13 MED ORDER — BUDESON-GLYCOPYRROL-FORMOTEROL 160-9-4.8 MCG/ACT IN AERO: 2.0000 | INHALATION_SPRAY | Freq: Every day | RESPIRATORY_TRACT | Status: DC

## 2023-05-13 MED ORDER — SUGAMMADEX SODIUM 200 MG/2ML IV SOLN
INTRAVENOUS | Status: DC | PRN
  Administered 2023-05-13: 150 mg via INTRAVENOUS

## 2023-05-13 MED ORDER — ROCURONIUM BROMIDE 100 MG/10ML IV SOLN
INTRAVENOUS | Status: DC | PRN
  Administered 2023-05-13: 50 mg via INTRAVENOUS

## 2023-05-13 MED ORDER — DEXAMETHASONE SODIUM PHOSPHATE 10 MG/ML IJ SOLN
INTRAMUSCULAR | Status: DC | PRN
Start: 2023-05-13 — End: 2023-05-13
  Administered 2023-05-13: 10 mg via INTRAVENOUS

## 2023-05-13 MED ORDER — ONDANSETRON HCL 4 MG/2ML IJ SOLN: 4.0000 mg | Freq: Four times a day (QID) | INTRAMUSCULAR | Status: DC | PRN

## 2023-05-13 MED ORDER — MENTHOL 3 MG MT LOZG: 1.0000 | LOZENGE | OROMUCOSAL | Status: DC | PRN

## 2023-05-13 MED ORDER — HYDROMORPHONE HCL 2 MG PO TABS
2.0000 mg | ORAL_TABLET | ORAL | 0 refills | Status: AC | PRN
Start: 2023-05-13 — End: ?

## 2023-05-13 MED ORDER — LACTATED RINGERS IV SOLN: INTRAVENOUS | Status: DC

## 2023-05-13 MED ORDER — CEFAZOLIN SODIUM-DEXTROSE 2-4 GM/100ML-% IV SOLN
2.0000 g | INTRAVENOUS | Status: AC
  Administered 2023-05-13: 2 g via INTRAVENOUS
  Filled 2023-05-13: qty 100

## 2023-05-13 MED ORDER — HYDROMORPHONE HCL 1 MG/ML IJ SOLN: 0.5000 mg | INTRAMUSCULAR | Status: DC | PRN

## 2023-05-13 MED ORDER — ALLOPURINOL 100 MG PO TABS
100.0000 mg | ORAL_TABLET | Freq: Every day | ORAL | Status: DC
  Administered 2023-05-14: 100 mg via ORAL
  Filled 2023-05-13: qty 1

## 2023-05-13 MED ORDER — HYDROMORPHONE HCL 2 MG PO TABS: 2.0000 mg | ORAL_TABLET | ORAL | Status: DC | PRN

## 2023-05-13 MED ORDER — POLYVINYL ALCOHOL 1.4 % OP SOLN: 1.0000 [drp] | OPHTHALMIC | Status: DC | PRN

## 2023-05-13 MED ORDER — VANCOMYCIN HCL 1000 MG IV SOLR
INTRAVENOUS | Status: DC | PRN
  Administered 2023-05-13: 1000 mg

## 2023-05-13 MED ORDER — PREDNISONE 1 MG PO TABS: 4.0000 mg | ORAL_TABLET | Freq: Every day | ORAL | Status: DC

## 2023-05-13 MED ORDER — FENTANYL CITRATE PF 50 MCG/ML IJ SOSY
PREFILLED_SYRINGE | INTRAMUSCULAR | Status: AC
  Administered 2023-05-13: 50 ug via INTRAVENOUS
  Filled 2023-05-13: qty 2

## 2023-05-13 MED ORDER — LORATADINE 10 MG PO TABS
10.0000 mg | ORAL_TABLET | Freq: Every day | ORAL | Status: DC
  Administered 2023-05-14: 10 mg via ORAL
  Filled 2023-05-13: qty 1

## 2023-05-13 MED ORDER — UMECLIDINIUM-VILANTEROL 62.5-25 MCG/ACT IN AEPB
1.0000 | INHALATION_SPRAY | Freq: Every day | RESPIRATORY_TRACT | Status: DC
  Filled 2023-05-13: qty 14

## 2023-05-13 MED ORDER — FENTANYL CITRATE PF 50 MCG/ML IJ SOSY
50.0000 ug | PREFILLED_SYRINGE | INTRAMUSCULAR | Status: DC
  Administered 2023-05-13: 100 ug via INTRAVENOUS
  Filled 2023-05-13: qty 2

## 2023-05-13 MED ORDER — MOMETASONE FURO-FORMOTEROL FUM 200-5 MCG/ACT IN AERO: 2.0000 | INHALATION_SPRAY | Freq: Two times a day (BID) | RESPIRATORY_TRACT | Status: DC

## 2023-05-13 MED ORDER — CHLORHEXIDINE GLUCONATE 0.12 % MT SOLN
15.0000 mL | Freq: Once | OROMUCOSAL | Status: AC
  Administered 2023-05-13: 15 mL via OROMUCOSAL

## 2023-05-13 MED ORDER — PANTOPRAZOLE SODIUM 40 MG PO TBEC
40.0000 mg | DELAYED_RELEASE_TABLET | Freq: Every day | ORAL | Status: DC
  Administered 2023-05-14: 40 mg via ORAL
  Filled 2023-05-13: qty 1

## 2023-05-13 MED ORDER — BUPIVACAINE HCL (PF) 0.5 % IJ SOLN
INTRAMUSCULAR | Status: DC | PRN
Start: 2023-05-13 — End: 2023-05-13
  Administered 2023-05-13: 10 mL via PERINEURAL

## 2023-05-13 MED ORDER — FENTANYL CITRATE PF 50 MCG/ML IJ SOSY
PREFILLED_SYRINGE | INTRAMUSCULAR | Status: AC
  Filled 2023-05-13: qty 1

## 2023-05-13 MED ORDER — METHOCARBAMOL 500 MG PO TABS: 500.0000 mg | ORAL_TABLET | Freq: Four times a day (QID) | ORAL | Status: DC | PRN

## 2023-05-13 MED ORDER — BUPIVACAINE LIPOSOME 1.3 % IJ SUSP
INTRAMUSCULAR | Status: DC | PRN
Start: 2023-05-13 — End: 2023-05-13
  Administered 2023-05-13: 10 mL via PERINEURAL

## 2023-05-13 MED ORDER — PHENOL 1.4 % MT LIQD: 1.0000 | OROMUCOSAL | Status: DC | PRN

## 2023-05-13 MED ORDER — METOCLOPRAMIDE HCL 5 MG/ML IJ SOLN: 5.0000 mg | Freq: Three times a day (TID) | INTRAMUSCULAR | Status: DC | PRN

## 2023-05-13 MED ORDER — ORAL CARE MOUTH RINSE: 15.0000 mL | Freq: Once | OROMUCOSAL | Status: AC

## 2023-05-13 MED ORDER — METOPROLOL SUCCINATE ER 25 MG PO TB24
25.0000 mg | ORAL_TABLET | Freq: Every day | ORAL | Status: DC
  Administered 2023-05-14: 25 mg via ORAL
  Filled 2023-05-13: qty 1

## 2023-05-13 MED ORDER — ROSUVASTATIN CALCIUM 20 MG PO TABS
20.0000 mg | ORAL_TABLET | Freq: Every day | ORAL | Status: DC
  Administered 2023-05-14: 20 mg via ORAL
  Filled 2023-05-13: qty 1

## 2023-05-13 MED ORDER — LIDOCAINE HCL (CARDIAC) PF 100 MG/5ML IV SOSY
PREFILLED_SYRINGE | INTRAVENOUS | Status: DC | PRN
  Administered 2023-05-13: 60 mg via INTRAVENOUS

## 2023-05-13 MED ORDER — ACETAMINOPHEN 325 MG PO TABS: 325.0000 mg | ORAL_TABLET | Freq: Four times a day (QID) | ORAL | Status: DC | PRN

## 2023-05-13 MED ORDER — ASPIRIN 81 MG PO CHEW
81.0000 mg | CHEWABLE_TABLET | Freq: Every day | ORAL | Status: DC
  Administered 2023-05-14: 81 mg via ORAL
  Filled 2023-05-13: qty 1

## 2023-05-13 MED ORDER — STERILE WATER FOR IRRIGATION IR SOLN
Status: DC | PRN
  Administered 2023-05-13: 2000 mL

## 2023-05-13 MED ORDER — PREDNISONE 5 MG PO TABS
9.0000 mg | ORAL_TABLET | Freq: Every day | ORAL | Status: DC
  Administered 2023-05-14: 9 mg via ORAL
  Filled 2023-05-13: qty 4

## 2023-05-13 MED ORDER — METOCLOPRAMIDE HCL 5 MG PO TABS: 5.0000 mg | ORAL_TABLET | Freq: Three times a day (TID) | ORAL | Status: DC | PRN

## 2023-05-13 MED ORDER — POLYETHYLENE GLYCOL 3350 17 G PO PACK: 17.0000 g | PACK | Freq: Every day | ORAL | Status: DC | PRN

## 2023-05-13 MED ORDER — ACETAMINOPHEN 500 MG PO TABS
1000.0000 mg | ORAL_TABLET | Freq: Once | ORAL | Status: AC
  Administered 2023-05-13: 1000 mg via ORAL
  Filled 2023-05-13: qty 2

## 2023-05-13 MED ORDER — GLYCOPYRROLATE 0.2 MG/ML IJ SOLN
INTRAMUSCULAR | Status: DC | PRN
  Administered 2023-05-13 (×2): .1 mg via INTRAVENOUS

## 2023-05-13 MED ORDER — ONDANSETRON HCL 4 MG PO TABS: 4.0000 mg | ORAL_TABLET | Freq: Four times a day (QID) | ORAL | Status: DC | PRN

## 2023-05-13 MED ORDER — TRAMADOL HCL 50 MG PO TABS
50.0000 mg | ORAL_TABLET | Freq: Four times a day (QID) | ORAL | Status: DC | PRN
  Administered 2023-05-14: 50 mg via ORAL
  Filled 2023-05-13: qty 1

## 2023-05-13 MED ORDER — UMECLIDINIUM BROMIDE 62.5 MCG/ACT IN AEPB
1.0000 | INHALATION_SPRAY | Freq: Every day | RESPIRATORY_TRACT | Status: DC
  Filled 2023-05-13: qty 7

## 2023-05-13 MED ORDER — METHOCARBAMOL 1000 MG/10ML IJ SOLN: 500.0000 mg | Freq: Four times a day (QID) | INTRAVENOUS | Status: DC | PRN

## 2023-05-13 MED ORDER — TRANEXAMIC ACID-NACL 1000-0.7 MG/100ML-% IV SOLN
1000.0000 mg | INTRAVENOUS | Status: AC
  Administered 2023-05-13: 1000 mg via INTRAVENOUS
  Filled 2023-05-13: qty 100

## 2023-05-13 MED ORDER — HYDROXYCHLOROQUINE SULFATE 200 MG PO TABS
300.0000 mg | ORAL_TABLET | Freq: Every day | ORAL | Status: DC
  Filled 2023-05-13: qty 1.5

## 2023-05-13 MED ORDER — VANCOMYCIN HCL 1000 MG IV SOLR
INTRAVENOUS | Status: AC
  Filled 2023-05-13: qty 20

## 2023-05-13 MED ORDER — CLOPIDOGREL BISULFATE 75 MG PO TABS
75.0000 mg | ORAL_TABLET | Freq: Every day | ORAL | Status: DC
  Administered 2023-05-14: 75 mg via ORAL
  Filled 2023-05-13: qty 1

## 2023-05-13 MED ORDER — METHOCARBAMOL 500 MG PO TABS: 500.0000 mg | ORAL_TABLET | Freq: Three times a day (TID) | ORAL | 1 refills | Status: DC | PRN

## 2023-05-13 SURGICAL SUPPLY — 72 items
ADH SKN CLS APL DERMABOND .7 (GAUZE/BANDAGES/DRESSINGS) ×1
ADH SKN CLS LQ APL DERMABOND (GAUZE/BANDAGES/DRESSINGS) ×1
AID PSTN UNV HD RSTRNT DISP (MISCELLANEOUS) ×1
BAG COUNTER SPONGE SURGICOUNT (BAG) IMPLANT
BAG SPEC THK2 15X12 ZIP CLS (MISCELLANEOUS) ×1
BAG SPNG CNTER NS LX DISP (BAG)
BAG ZIPLOCK 12X15 (MISCELLANEOUS) ×1 IMPLANT
BIT DRILL AR 3 NS (BIT) IMPLANT
BLADE SAW SGTL 83.5X18.5 (BLADE) ×1 IMPLANT
BNDG CMPR 5X4 CHSV STRCH STRL (GAUZE/BANDAGES/DRESSINGS) ×1
BNDG COHESIVE 4X5 TAN STRL LF (GAUZE/BANDAGES/DRESSINGS) ×1 IMPLANT
BSPLAT GLND +2X24 MDLR (Joint) ×1 IMPLANT
COMP HUM GLENOID RSA 33 +3 (Shoulder) ×1 IMPLANT
COMPONENT HUM GLENOD RSA 33 +3 (Shoulder) IMPLANT
COOLER ICEMAN CLASSIC (MISCELLANEOUS) ×1 IMPLANT
COVER BACK TABLE 60X90IN (DRAPES) ×1 IMPLANT
COVER SURGICAL LIGHT HANDLE (MISCELLANEOUS) ×1 IMPLANT
CUP SUT UNIV REV NEUTRAL 33 (Cup) IMPLANT
DERMABOND ADVANCED .7 DNX12 (GAUZE/BANDAGES/DRESSINGS) ×1 IMPLANT
DERMABOND ADVANCED .7 DNX6 (GAUZE/BANDAGES/DRESSINGS) IMPLANT
DRAPE ORTHO SPLIT 77X108 STRL (DRAPES) ×2
DRAPE SHEET LG 3/4 BI-LAMINATE (DRAPES) ×1 IMPLANT
DRAPE SURG 17X11 SM STRL (DRAPES) ×1 IMPLANT
DRAPE SURG ORHT 6 SPLT 77X108 (DRAPES) ×2 IMPLANT
DRAPE TOP 10253 STERILE (DRAPES) ×1 IMPLANT
DRAPE U-SHAPE 47X51 STRL (DRAPES) ×1 IMPLANT
DRESSING AQUACEL AG SP 3.5X6 (GAUZE/BANDAGES/DRESSINGS) ×1 IMPLANT
DRSG AQUACEL AG ADV 3.5X 6 (GAUZE/BANDAGES/DRESSINGS) IMPLANT
DRSG AQUACEL AG ADV 3.5X10 (GAUZE/BANDAGES/DRESSINGS) IMPLANT
DRSG AQUACEL AG SP 3.5X6 (GAUZE/BANDAGES/DRESSINGS) ×1
DURAPREP 26ML APPLICATOR (WOUND CARE) ×1 IMPLANT
ELECT BLADE TIP CTD 4 INCH (ELECTRODE) ×1 IMPLANT
ELECT PENCIL ROCKER SW 15FT (MISCELLANEOUS) ×1 IMPLANT
ELECT REM PT RETURN 15FT ADLT (MISCELLANEOUS) ×1 IMPLANT
FACESHIELD WRAPAROUND (MASK) ×5
FACESHIELD WRAPAROUND OR TEAM (MASK) ×5 IMPLANT
GLENOID UNI REV MOD 24 +2 LAT (Joint) IMPLANT
GLENOSPHERE 36 +4 LAT/24 (Joint) IMPLANT
GLENOSPHERE SYSTEM 36/24 (Shoulder) IMPLANT
GLOVE BIO SURGEON STRL SZ7.5 (GLOVE) ×1 IMPLANT
GLOVE BIO SURGEON STRL SZ8 (GLOVE) ×1 IMPLANT
GLOVE SS BIOGEL STRL SZ 7 (GLOVE) ×1 IMPLANT
GLOVE SS BIOGEL STRL SZ 7.5 (GLOVE) ×1 IMPLANT
GOWN STRL SURGICAL XL XLNG (GOWN DISPOSABLE) ×2 IMPLANT
KIT BASIN OR (CUSTOM PROCEDURE TRAY) ×1 IMPLANT
KIT TURNOVER KIT A (KITS) IMPLANT
MANIFOLD NEPTUNE II (INSTRUMENTS) ×1 IMPLANT
NDL TAPERED W/ NITINOL LOOP (MISCELLANEOUS) ×1 IMPLANT
NEEDLE TAPERED W/ NITINOL LOOP (MISCELLANEOUS) ×1
NS IRRIG 1000ML POUR BTL (IV SOLUTION) ×1 IMPLANT
PACK SHOULDER (CUSTOM PROCEDURE TRAY) ×1 IMPLANT
PAD ARMBOARD 7.5X6 YLW CONV (MISCELLANEOUS) ×1 IMPLANT
PAD COLD SHLDR WRAP-ON (PAD) ×1 IMPLANT
PIN NITINOL TARGETER 2.8 (PIN) IMPLANT
PIN SET MODULAR GLENOID SYSTEM (PIN) IMPLANT
RESTRAINT HEAD UNIVERSAL NS (MISCELLANEOUS) ×1 IMPLANT
SCREW CENTRAL MODULAR 20 (Screw) IMPLANT
SCREW PERI LOCK 5.5X32 (Screw) IMPLANT
SCREW PERIPHERAL 5.5X20 LOCK (Screw) IMPLANT
SLING ARM FOAM STRAP LRG (SOFTGOODS) IMPLANT
SLING ARM FOAM STRAP MED (SOFTGOODS) IMPLANT
STEM HUMERAL MOD SZ 5 135 DEG (Stem) IMPLANT
SUT MNCRL AB 3-0 PS2 18 (SUTURE) ×1 IMPLANT
SUT MON AB 2-0 CT1 36 (SUTURE) ×1 IMPLANT
SUT VIC AB 1 CT1 36 (SUTURE) ×1 IMPLANT
SUTURE TAPE 1.3 40 TPR END (SUTURE) ×2 IMPLANT
SUTURETAPE 1.3 40 TPR END (SUTURE) ×2
TOWEL OR 17X26 10 PK STRL BLUE (TOWEL DISPOSABLE) ×1 IMPLANT
TOWEL OR NON WOVEN STRL DISP B (DISPOSABLE) ×1 IMPLANT
TUBE SUCTION HIGH CAP CLEAR NV (SUCTIONS) ×1 IMPLANT
TUBING CONNECTING 10 (TUBING) ×1 IMPLANT
WATER STERILE IRR 1000ML POUR (IV SOLUTION) ×2 IMPLANT

## 2023-05-13 NOTE — Progress Notes (Signed)
   05/13/23 2059  BiPAP/CPAP/SIPAP  $ Non-Invasive Home Ventilator  Initial  BiPAP/CPAP/SIPAP Pt Type Adult  BiPAP/CPAP/SIPAP Resmed  Mask Type Nasal pillows  Respiratory Rate 16 breaths/min  EPAP 13 cmH2O (per chart notes from OCT2022)  FiO2 (%) 21 %  Patient Home Equipment No (only her nasal pillows and tubing from home)  Auto Titrate No  CPAP/SIPAP surface wiped down Yes  BiPAP/CPAP /SiPAP Vitals  Pulse Rate (!) 58  Resp 16  SpO2 95 %

## 2023-05-13 NOTE — H&P (Signed)
Tracey [name]    Chief Complaint: Right shoulder rotator cuff tear arthropathy HPI: The patient is a 77 y.o. female with chronic and progressive increasing right shoulder pain related to severe rotator cuff tear arthropathy.  Due to her increasing functional limitations and failure to respond to prolonged attempts of conservative management, she is brought to the operating this time for planned right shoulder reverse arthroplasty.  Past Medical History:  Diagnosis Date   Allergy    Anxiety    takes Ativan daily prn   Arthritis    Breast cancer (HCC) 2014   ER+/PR+/HEr2-,    Bruises easily    Colon polyps    2 polyps by report   Coronary artery disease involving native coronary artery of native heart without angina pectoris 01/01/2014   NSTEMI in January 2019 in [place]>> s/p DES x2 to the LAD (procedure complicated by stent thrombosis) Myoview 05/2020: EF 72, no infarct or ischemia; low risk Echocardiogram 05/2019: EF 60-65, GLS -21.5 Cardiac catheterization at [facility] in 08/2017: Normal left main, normal LCx, normal RCA; LAD/diagonal 95%>> PCI    Diverticulosis    GERD (gastroesophageal reflux disease)    occasionally takes Nexium    Gout    Headache    MIgraines- no headaches- has floater in eyes   History of bladder infections    many yrs ago   History of bronchitis    last time at least 55yrs ago   History of hiatal hernia    History of stress incontinence    Hyperlipidemia    takes Pravastatin daily   Hypertension    Insomnia    takes Ambien nightly prn   NSTEMI (non-ST elevated myocardial infarction) (HCC) 08/2017   OSA (obstructive sleep apnea)    on CPAP   Osteoarthritis    Peripheral edema    takes Furosemide daily prn   PONV (postoperative nausea and vomiting)    Postmenopausal hormone therapy    Radiation 07/27/13-09/07/13   Right Breast x 31 treatments   Rhinitis    uses Flonase prn   Sinus congestion    Status post breast  reconstruction 10/15/2014   Bilateral implant removal and DIEP performed in [place], [place]   Tachycardia    takes Metoprolol daily   Ventricular tachycardia, non-sustained (HCC)    during sleep study 2009 with normal cardiac workup      Past Surgical History:  Procedure Laterality Date   ABDOMINAL HYSTERECTOMY     with BSO   APPENDECTOMY     ARTERY BIOPSY Left 05/15/2022   Procedure: BIOPSY TEMPORAL ARTERY;  Surgeon: Cephus Shelling, MD;  Location: [facility] OR;  Service: Vascular;  Laterality: Left;   AXILLARY SENTINEL NODE BIOPSY Right 05/23/2013   Procedure: AXILLARY SENTINEL NODE BIOPSY;  Surgeon: Adolph Pollack, MD;  Location: [facility] OR;  Service: General;  Laterality: Right;  nuc med injection 7:00   BREAST RECONSTRUCTION WITH PLACEMENT OF TISSUE EXPANDER AND FLEX HD (ACELLULAR HYDRATED DERMIS) Bilateral 05/23/2013   Procedure: BILATERAL BREAST RECONSTRUCTION WITH PLACEMENT OF TISSUE EXPANDER AND FLEX HD;  Surgeon: Etter Sjogren, MD;  Location: [facility] OR;  Service: Plastics;  Laterality: Bilateral;   CARDIAC CATHETERIZATION  1999   CATARACT EXTRACTION, BILATERAL Bilateral    CHOLECYSTECTOMY     COSMETIC SURGERY     OTHER SURGICAL HISTORY  09/2017   had cyst removal from spine    PERCUTANEOUS CORONARY STENT INTERVENTION (PCI-S)  08/2017   done in [place]  REMOVAL OF BILATERAL TISSUE EXPANDERS WITH PLACEMENT OF BILATERAL BREAST IMPLANTS Bilateral 05/17/2014   Procedure: REMOVAL OF BILATERAL TISSUE EXPANDERS WITH PLACEMENT OF BILATERAL BREAST IMPLANTS FOR RECONSTRUCTION;  Surgeon: Etter Sjogren, MD;  Location: [facility] OR;  Service: Plastics;  Laterality: Bilateral;   TONSILLECTOMY     TOTAL KNEE ARTHROPLASTY Right 07/26/2018   Procedure: TOTAL KNEE ARTHROPLASTY;  Surgeon: Durene Romans, MD;  Location: [facility] ORS;  Service: Orthopedics;  Laterality: Right;  70 mins   TOTAL KNEE ARTHROPLASTY Left 12/02/2021   Procedure: TOTAL KNEE ARTHROPLASTY;  Surgeon: Durene Romans, MD;  Location: [facility] ORS;  Service:  Orthopedics;  Laterality: Left;   TOTAL MASTECTOMY Bilateral 05/23/2013   Procedure: TOTAL MASTECTOMY;  Surgeon: Adolph Pollack, MD;  Location: [facility] OR;  Service: General;  Laterality: Bilateral;    Family History  Problem Relation Age of Onset   Breast cancer Mother        possible inflammatory breast cancer   Heart attack Father    Heart disease Father    ALS Sister    Breast cancer Maternal Grandmother 92   Heart attack Maternal Grandfather    Heart attack Paternal Grandfather    Breast cancer Maternal Aunt        dx in her 61s   Breast cancer Other        maternal great grandmother; dx in her 56s    Social History:  reports that she has quit smoking. Her smoking use included cigarettes. She started smoking about 30 years ago. She has a 30.9 pack-year smoking history. She has never used smokeless tobacco. She reports that she does not currently use alcohol after a past usage of about 1.0 standard drink of alcohol per week. She reports that she does not currently use drugs after having used the following drugs: Other-see comments.  BMI: Estimated body mass index is 27.07 kg/m as calculated from the following:   Height as of 04/30/23: 5\' 2"  (1.575 m).   Weight as of 04/30/23: 67.1 kg.  Lab Results  Component Value Date   ALBUMIN 2.7 (L) 12/30/2021   Diabetes: Patient does not have a diagnosis of diabetes.     Smoking Status:       No medications prior to admission.     Physical Exam: Right shoulder demonstrates painful and guarded motion with global weakness is noted after recent office visit.  She is otherwise neurovascular intact in the right upper extremity.  Imaging studies confirm changes consistent with chronic rotator cuff tear arthropathy.  Vitals     Assessment/Plan  Impression: Right shoulder rotator cuff tear arthropathy  Plan of Action: Procedure(s): REVERSE SHOULDER ARTHROPLASTY  Mihir Flanigan M Johnell Landowski 05/13/2023, 6:35 AM Contact #  [phone]

## 2023-05-13 NOTE — Plan of Care (Signed)
Problem: Education: Goal: Knowledge of General Education information will improve Description: Including pain rating scale, medication(s)/side effects and non-pharmacologic comfort measures Outcome: Progressing   Problem: Activity: Goal: Risk for activity intolerance will decrease Outcome: Progressing   Problem: Pain Managment: Goal: General experience of comfort will improve Outcome: Progressing

## 2023-05-13 NOTE — Discharge Instructions (Signed)
? ?Vania Rea. Supple, M.D., F.A.A.O.S. ?Orthopaedic Surgery ?Specializing in Arthroscopic and Reconstructive ?Surgery of the Shoulder ?[phone] ?[address] [place], [place] [address] - Fax [phone] ? ? ?POST-OP TOTAL SHOULDER REPLACEMENT INSTRUCTIONS ? ?1. Follow up in the office for your first post-op appointment 10-14 days from the date of your surgery. If you do not already have a scheduled appointment, our office will contact you to schedule. ? ?2. The bandage over your incision is waterproof. You may begin showering with this dressing on. You may leave this dressing on until first follow up appointment within 2 weeks. We prefer you leave this dressing in place until follow up however after 5-7 days if you are having itching or skin irritation and would like to remove it you may do so. Go slow and tug at the borders gently to break the bond the dressing has with the skin. At this point if there is no drainage it is okay to go without a bandage or you may cover it with a light guaze and tape. You can also expect significant bruising around your shoulder that will drift down your arm and into your chest wall. This is very normal and should resolve over several days. ? ? 3. Wear your sling/immobilizer at all times except to perform the exercises below or to occasionally let your arm dangle by your side to stretch your elbow. You also need to sleep in your sling immobilizer until instructed otherwise. It is ok to remove your sling if you are sitting in a controlled environment and allow your arm to rest in a position of comfort by your side or on your lap with pillows to give your neck and skin a break from the sling. You may remove it to allow arm to dangle by side to shower. If you are up walking around and when you go to sleep at night you need to wear it. ? ?4. Range of motion to your elbow, wrist, and hand are encouraged 3-5 times daily. Exercise to your hand and fingers helps to reduce  swelling you may experience. ? ? ?5. Prescriptions for a pain medication and a muscle relaxant are provided for you. It is recommended that if you are experiencing pain that you pain medication alone is not controlling, add the muscle relaxant along with the pain medication which can give additional pain relief. The first 1-2 days is generally the most severe of your pain and then should gradually decrease. As your pain lessens it is recommended that you decrease your use of the pain medications to an "as needed basis'" only and to always comply with the recommended dosages of the pain medications. ? ?6. Pain medications can produce constipation along with their use. If you experience this, the use of an over the counter stool softener or laxative daily is recommended.  ? ?7. For additional questions or concerns, please do not hesitate to call the office. If after hours there is an answering service to forward your concerns to the physician on call. ? ?8.Pain control following an exparel block ? ?To help control your post-operative pain you received a nerve block  performed with Exparel which is a long acting anesthetic (numbing agent) which can provide pain relief and sensations of numbness (and relief of pain) in the operative shoulder and arm for up to 3 days. Sometimes it provides mixed relief, meaning you may still have numbness in certain areas of the arm but can still be able to  move  parts of that arm, hand, and fingers. We recommend that your prescribed pain medications  be used as needed. We do not feel it is necessary to "pre medicate" and "stay ahead" of pain.  Taking narcotic pain medications when you are not having any pain can lead to unnecessary and potentially dangerous side effects.   ? ?9. Use the ice machine as much as possible in the first 5-7 days from surgery, then you can wean its use to as needed. The ice typically needs to be replaced every 6 hours, instead of ice you can actually freeze  water bottles to put in the cooler and then fill water around them to avoid having to purchase ice. You can have spare water bottles freezing to allow you to rotate them once they have melted. Try to have a thin shirt or light cloth or towel under the ice wrap to protect your skin.  ? ?FOR ADDITIONAL INFO ON ICE MACHINE AND INSTRUCTIONS GO TO THE WEBSITE AT ? ?[email or web address] ? ?10.  We recommend that you avoid any dental work or cleaning in the first 3 months following your joint replacement. This is to help minimize the possibility of infection from the bacteria in your mouth that enters your bloodstream during dental work. We also recommend that you take an antibiotic prior to your dental work for the first year after your shoulder replacement to further help reduce that risk. Please simply contact our office for antibiotics to be sent to your pharmacy prior to dental work. ? ?11. Dental Antibiotics: ? ?We recommend waiting at least 3 months for any dental work even cleanings unless there is a Actuary. We also recommend  prophylactic antibiotics for all dental procdeures  the first year following your joint replacement. In some exceptions we recommend them to be used lifelong. We will provide you with that prescription in follow up office visits, or you can call our office. ? ?Exceptions are as follows: ? ?1. History of prior total joint infection ? ?2. Severely immunocompromised (Organ Transplant, cancer chemotherapy, Rheumatoid biologic ?meds such as Humera) ? ?3. Poorly controlled diabetes (A1C &gt; 8.0, blood glucose over 200) ? ? ?POST-OP EXERCISES ? ?Pendulum Exercises ? ?Perform pendulum exercises while standing and bending at the waist. Support your uninvolved arm on a table or chair and allow your operated arm to hang freely. Make sure to do these exercises passively - not using you shoulder muscles. These exercises can be performed once your  nerve block effects have worn off. ? ?Repeat 20 times. Do 3 sessions per day. ? ? ?  ?

## 2023-05-13 NOTE — Op Note (Signed)
05/13/2023  3:12 PM  PATIENT:   Tracey [name]  77 y.o. female  PRE-OPERATIVE DIAGNOSIS:  Right shoulder rotator cuff tear arthropathy  POST-OPERATIVE DIAGNOSIS: Same  PROCEDURE: Right shoulder reverse arthroplasty lysing a press-fit size 5.5 Arthrex stem with a neutral metaphysis, 33 constrained polyethylene insert, 36/neutral glenosphere and a small/+2 baseplate  SURGEON:  Gaspare Netzel, Vania Rea M.D.  ASSISTANTS: Ralene Bathe, PA-C  Ralene Bathe, PA-C was utilized as an Geophysicist/field seismologist throughout this case, essential for help with positioning the patient, positioning extremity, tissue manipulation, implantation of the prosthesis, suture management, wound closure, and intraoperative decision-making.  ANESTHESIA:   General Endotracheal and interscalene block with Exparel  EBL: 100 cc  SPECIMEN: None  Drains: None   PATIENT DISPOSITION:  PACU - hemodynamically stable.    PLAN OF CARE: Admit for overnight observation  Brief history:  Patient is a 77 year old female with chronic and progressively increasing right shoulder pain related to severe rotator cuff tear arthropathy.  Due to her increasing functional limitations and failure to respond to prolonged attempts of conservative management, she is brought to the operating this time for planned right shoulder reverse arthroplasty.  Preoperatively, I counseled the patient regarding treatment options and risks versus benefits thereof.  Possible surgical complications were all reviewed including potential for bleeding, infection, neurovascular injury, persistent pain, loss of motion, anesthetic complication, failure of the implant, and possible need for additional surgery. They understand and accept and agrees with our planned procedure.   Procedure detail:  After undergoing routine preop evaluation the patient received prophylactic antibiotics and interscalene block with Exparel established in the holding area by the anesthesia department.   Subsequently placed spine on the operating table and underwent the smooth induction of a general endotracheal anesthesia.  Placed into the beachchair position and appropriately padded and protected.  The right shoulder girdle region was sterilely prepped and draped in standard fashion.  Timeout was called.  A deltopectoral approach to the right shoulder is made through an approximately 8 cm incision.  Skin flaps elevated dissection carried deeply deltopectoral interval was then developed from proximal to distal with the vein taken laterally.  The conjoined tendon was mobilized and retracted medially and the long head bicep tendon was then tenodesed at the upper border the pectoralis major tendon with the proximal segment unroofed and excised.  The subscapularis was then separated from the lesser tuberosity using electrocautery with the free margin tagged with a pair of grasping suture tape sutures.  Superior remnant of the rotator cuff was then split to the base of the coracoid and capsular attachments were then divided from the anterior and inferior margins of the humeral neck delivering the humeral head through the wound.  An extra medullary guide was then used to outline the proposed humeral head resection which we performed with an oscillating saw at 20 degrees of retroversion.  A metal cap was then placed of the cut proximal humeral surface which we did find to be quite diminutive suggesting appropriate utilization for a 33 metathesis.  At this point the glenoid was exposed and a circumferential labral resection was performed.  A guidepin was directed into the center of the glenoid and glenoid was then reamed with the central followed by the peripheral reamer to a stable subchondral bony bed.  Preparation completed with the drill and tapped for a 25 mm lag screw.  Our baseplate was then assembled and the baseplate was inserted with some vancomycin powder applied to the threads of the lag screw  and good fixation  was achieved and all the peripheral locking screws were then placed using standard technique with excellent fixation.  We initially placed a 36/+4 glenosphere.  We then performed a preparation of the humeral canal seated a trial implant.  The metaphysis and at this point could not achieve an appropriate reduction after multiple attempts at downsizing and adjusting and so it became clear that we would need to downsize the glenosphere and so the original +4 was removed this was replaced with a 36 neutral glenosphere which was impacted and the locking screw was then placed and then we reassessed the metaphysis placing a size 5 stem with a neutral 33 metaphysis and this did allow excellent reduction with good motion stability and soft tissue balance.  This point our final size 5 stem was assembled with the 33 baseplate impacted in position with excellent fixation.  Trial reductions were then performed and we ultimately felt that a +3 constrained poly would give [place] the best motion stability and soft tissue balance.  The trial was then removed.  The final +3 constrained poly was impacted onto the implant and a final reduction was then performed which again showed excellent motion ability and soft tissue balance all much to our satisfaction.  The wound was then copious irrigated.  Final hemostasis was obtained.  The subscapularis was mobilized and confirmed to have good elasticity and was repaired back to the eyelets on the collar of the implant using the previously placed suture tape sutures.  The deltopectoral interval was reapproximated with a series of figure-of-eight number Vicryl sutures after the balance of the vancomycin powder has been liberally throughout the deep soft tissue planes.  Subcu layer closed with 2-0 Monocryl and intracuticular 3-0 Monocryl used to close the skin followed by Dermabond and an Aquacel dressing.  The right arm placed into a sling and the patient was awakened, extubated, and taken to the  recovery in stable condition.  Senaida Lange MD   Contact # [phone]

## 2023-05-13 NOTE — Anesthesia Procedure Notes (Signed)
Anesthesia Regional Block: Interscalene brachial plexus block   Pre-Anesthetic Checklist: , timeout performed,  Correct Patient, Correct Site, Correct Laterality,  Correct Procedure, Correct Position, site marked,  Risks and benefits discussed,  Surgical consent,  Pre-op evaluation,  At surgeon's request and post-op pain management  Laterality: Right  Prep: chloraprep       Needles:  Injection technique: Single-shot  Needle Type: Echogenic Stimulator Needle     Needle Length: 9cm  Needle Gauge: 21     Additional Needles:   Procedures:,,,, ultrasound used (permanent image in chart),,    Narrative:  Start time: 05/13/2023 10:43 AM End time: 05/13/2023 10:47 AM Injection made incrementally with aspirations every 5 mL.  Performed by: Personally  Anesthesiologist: Linton Rump, MD  Additional Notes: Discussed risks and benefits of nerve block including, but not limited to, prolonged and/or permanent nerve injury involving sensory and/or motor function. Monitors were applied and a time-out was performed. The nerve and associated structures were visualized under ultrasound guidance. After negative aspiration, local anesthetic was slowly injected around the nerve. There was no evidence of high pressure during the procedure. There were no paresthesias. VSS remained stable and the patient tolerated the procedure well.

## 2023-05-13 NOTE — Transfer of Care (Signed)
Immediate Anesthesia Transfer of Care Note  Patient: TAMIAH ROBELLO  Procedure(s) Performed: REVERSE SHOULDER ARTHROPLASTY (Right: Shoulder)  Patient Location: PACU  Anesthesia Type:General and Regional  Level of Consciousness: drowsy  Airway & Oxygen Therapy: Patient Spontanous Breathing and Patient connected to face mask oxygen  Post-op Assessment: Report given to RN and Post -op Vital signs reviewed and stable  Post vital signs: Reviewed and stable  Last Vitals:  Vitals Value Taken Time  BP 135/59 05/13/23 1540  Temp    Pulse 56 05/13/23 1541  Resp 19 05/13/23 1541  SpO2 100 % 05/13/23 1541  Vitals shown include unfiled device data.  Last Pain:  Vitals:   05/13/23 1014  TempSrc:   PainSc: 0-No pain      Patients Stated Pain Goal: 4 (05/13/23 1014)  Complications: No notable events documented.

## 2023-05-13 NOTE — Anesthesia Procedure Notes (Signed)
Procedure Name: Intubation Date/Time: 05/13/2023 1:36 PM  Performed by: Nathen May, CRNAPre-anesthesia Checklist: Patient identified, Emergency Drugs available, Suction available and Patient being monitored Patient Re-evaluated:Patient Re-evaluated prior to induction Oxygen Delivery Method: Circle System Utilized Preoxygenation: Pre-oxygenation with 100% oxygen Induction Type: IV induction Ventilation: Mask ventilation without difficulty Laryngoscope Size: Mac, 3 and Glidescope Grade View: Grade II Tube type: Oral Tube size: 7.0 mm Number of attempts: 1 Airway Equipment and Method: Stylet and Oral airway Placement Confirmation: ETT inserted through vocal cords under direct vision, positive ETCO2 and breath sounds checked- equal and bilateral Tube secured with: Tape Dental Injury: Teeth and Oropharynx as per pre-operative assessment

## 2023-05-13 NOTE — Anesthesia Postprocedure Evaluation (Signed)
Anesthesia Post Note  Patient: Tracey [name]  Procedure(s) Performed: REVERSE SHOULDER ARTHROPLASTY (Right: Shoulder)     Patient location during evaluation: PACU Anesthesia Type: General Level of consciousness: awake Pain management: pain level controlled Vital Signs Assessment: post-procedure vital signs reviewed and stable Respiratory status: spontaneous breathing, nonlabored ventilation and respiratory function stable Cardiovascular status: blood pressure returned to baseline and stable Postop Assessment: no apparent nausea or vomiting Anesthetic complications: no   No notable events documented.  Last Vitals:  Vitals:   05/13/23 1645 05/13/23 1707  BP: (!) 135/59 (!) 160/80  Pulse: 62 (!) 57  Resp: 16 17  Temp:  (!) 36.4 C  SpO2: 98% 95%    Last Pain:  Vitals:   05/13/23 1707  TempSrc: Oral  PainSc:                  Linton Rump

## 2023-05-13 NOTE — Plan of Care (Signed)
  Problem: Education: Goal: Knowledge of General Education information will improve Description: Including pain rating scale, medication(s)/side effects and non-pharmacologic comfort measures Outcome: Progressing   Problem: Health Behavior/Discharge Planning: Goal: Ability to manage health-related needs will improve Outcome: Progressing   Problem: Clinical Measurements: Goal: Ability to maintain clinical measurements within normal limits will improve Outcome: Progressing Goal: Will remain free from infection Outcome: Progressing Goal: Diagnostic test results will improve Outcome: Progressing Goal: Respiratory complications will improve Outcome: Progressing Goal: Cardiovascular complication will be avoided Outcome: Progressing   Problem: Activity: Goal: Risk for activity intolerance will decrease Outcome: Adequate for Discharge   Problem: Nutrition: Goal: Adequate nutrition will be maintained Outcome: Completed/Met   Problem: Coping: Goal: Level of anxiety will decrease Outcome: Progressing   Problem: Elimination: Goal: Will not experience complications related to bowel motility Outcome: Progressing Goal: Will not experience complications related to urinary retention Outcome: Completed/Met   Problem: Pain Managment: Goal: General experience of comfort will improve Outcome: Progressing   Problem: Safety: Goal: Ability to remain free from injury will improve Outcome: Progressing   Problem: Skin Integrity: Goal: Risk for impaired skin integrity will decrease Outcome: Adequate for Discharge   Problem: Education: Goal: Knowledge of the prescribed therapeutic regimen will improve Outcome: Progressing Goal: Understanding of activity limitations/precautions following surgery will improve Outcome: Progressing Goal: Individualized Educational Video(s) Outcome: Completed/Met   Problem: Activity: Goal: Ability to tolerate increased activity will improve Outcome:  Adequate for Discharge   Problem: Pain Management: Goal: Pain level will decrease with appropriate interventions Outcome: Adequate for Discharge

## 2023-05-14 ENCOUNTER — Encounter (HOSPITAL_COMMUNITY): Payer: Self-pay | Admitting: Orthopedic Surgery

## 2023-05-14 DIAGNOSIS — I2510 Atherosclerotic heart disease of native coronary artery without angina pectoris: Secondary | ICD-10-CM | POA: Diagnosis not present

## 2023-05-14 DIAGNOSIS — M75101 Unspecified rotator cuff tear or rupture of right shoulder, not specified as traumatic: Secondary | ICD-10-CM | POA: Diagnosis not present

## 2023-05-14 DIAGNOSIS — Z955 Presence of coronary angioplasty implant and graft: Secondary | ICD-10-CM | POA: Diagnosis not present

## 2023-05-14 DIAGNOSIS — Z853 Personal history of malignant neoplasm of breast: Secondary | ICD-10-CM | POA: Diagnosis not present

## 2023-05-14 DIAGNOSIS — G4733 Obstructive sleep apnea (adult) (pediatric): Secondary | ICD-10-CM | POA: Diagnosis not present

## 2023-05-14 DIAGNOSIS — I10 Essential (primary) hypertension: Secondary | ICD-10-CM | POA: Diagnosis not present

## 2023-05-14 NOTE — Discharge Summary (Signed)
PATIENT ID:      CASMIRA [name]  MRN:     [id number] DOB/AGE:    [birth year] / 78 y.o.     DISCHARGE SUMMARY  ADMISSION DATE:    05/13/2023 DISCHARGE DATE:   05/14/2023  ADMISSION DIAGNOSIS: Right shoulder rotator cuff tear arthropathy Past Medical History:  Diagnosis Date   Allergy    Anxiety    takes Ativan daily prn   Arthritis    Breast cancer (HCC) 2014   ER+/PR+/HEr2-,    Bruises easily    Colon polyps    2 polyps by report   Coronary artery disease involving native coronary artery of native heart without angina pectoris 01/01/2014   NSTEMI in January 2019 in [place]>> s/p DES x2 to the LAD (procedure complicated by stent thrombosis) Myoview 05/2020: EF 72, no infarct or ischemia; low risk Echocardiogram 05/2019: EF 60-65, GLS -21.5 Cardiac catheterization at [facility] in 08/2017: Normal left main, normal LCx, normal RCA; LAD/diagonal 95%>> PCI    Diverticulosis    GERD (gastroesophageal reflux disease)    occasionally takes Nexium    Gout    Headache    MIgraines- no headaches- has floater in eyes   History of bladder infections    many yrs ago   History of bronchitis    last time at least 37yrs ago   History of hiatal hernia    History of stress incontinence    Hyperlipidemia    takes Pravastatin daily   Hypertension    Insomnia    takes Ambien nightly prn   NSTEMI (non-ST elevated myocardial infarction) (HCC) 08/2017   OSA (obstructive sleep apnea)    on CPAP   Osteoarthritis    Peripheral edema    takes Furosemide daily prn   PONV (postoperative nausea and vomiting)    Postmenopausal hormone therapy    Radiation 07/27/13-09/07/13   Right Breast x 31 treatments   Rhinitis    uses Flonase prn   Sinus congestion    Status post breast reconstruction 10/15/2014   Bilateral implant removal and DIEP performed in [place], [place]   Tachycardia    takes Metoprolol daily   Ventricular tachycardia, non-sustained (HCC)    during sleep study 2009 with  normal cardiac workup    DISCHARGE DIAGNOSIS:   Principal Problem:   S/P reverse total shoulder arthroplasty, right   PROCEDURE: Procedure(s): REVERSE SHOULDER ARTHROPLASTY on 05/13/2023  CONSULTS:    HISTORY:  See H&P in chart.  HOSPITAL COURSE:  Tracey [name] is a 77 y.o. admitted on 05/13/2023 with a diagnosis of Right shoulder rotator cuff tear arthropathy.  They were brought to the operating room on 05/13/2023 and underwent Procedure(s): REVERSE SHOULDER ARTHROPLASTY.    They were given perioperative antibiotics:  Anti-infectives (From admission, onward)    Start     Dose/Rate Route Frequency Ordered Stop   05/14/23 1000  hydroxychloroquine (PLAQUENIL) tablet 300 mg        300 mg Oral Daily 05/13/23 1709     05/13/23 1402  vancomycin (VANCOCIN) powder  Status:  Discontinued          As needed 05/13/23 1403 05/13/23 1701   05/13/23 1000  ceFAZolin (ANCEF) IVPB 2g/100 mL premix        2 g 200 mL/hr over 30 Minutes Intravenous On call to O.R. 05/13/23 0957 05/13/23 1328     .  Patient underwent the above named procedure and tolerated it well. The following day they were  hemodynamically stable and pain was controlled on oral analgesics. They were neurovascularly intact to the operative extremity. OT was ordered and worked with patient per protocol. They were medically and orthopaedically stable for discharge on .    DIAGNOSTIC STUDIES:  RECENT RADIOGRAPHIC STUDIES :  No results found.  RECENT VITAL SIGNS:  Patient Vitals for the past 24 hrs:  BP Temp Temp src Pulse Resp SpO2 Height Weight  05/14/23 0616 (!) 122/58 98.4 F (36.9 C) Oral 66 17 96 % -- --  05/14/23 0155 133/69 97.7 F (36.5 C) -- 61 17 98 % -- --  05/13/23 2141 139/62 97.6 F (36.4 C) -- (!) 56 17 96 % -- --  05/13/23 2059 -- -- -- (!) 58 16 95 % -- --  05/13/23 1805 (!) 167/72 97.9 F (36.6 C) -- (!) 56 20 100 % -- --  05/13/23 1707 (!) 160/80 (!) 97.5 F (36.4 C) Oral (!) 57 17 95 % -- --  05/13/23  1645 (!) 135/59 -- -- 62 16 98 % -- --  05/13/23 1630 127/64 97.9 F (36.6 C) -- (!) 57 13 99 % -- --  05/13/23 1615 132/63 -- -- (!) 57 14 99 % -- --  05/13/23 1600 137/65 -- -- (!) 57 15 100 % -- --  05/13/23 1545 (!) 138/52 98.1 F (36.7 C) -- (!) 57 18 100 % -- --  05/13/23 1202 129/62 -- -- (!) 56 16 96 % -- --  05/13/23 1157 -- -- -- (!) 58 16 96 % -- --  05/13/23 1152 -- -- -- (!) 59 19 98 % -- --  05/13/23 1147 138/73 -- -- (!) 58 14 98 % -- --  05/13/23 1142 -- -- -- (!) 59 20 97 % -- --  05/13/23 1137 -- -- -- (!) 58 18 97 % -- --  05/13/23 1132 137/65 -- -- 60 18 97 % -- --  05/13/23 1127 -- -- -- (!) 59 18 96 % -- --  05/13/23 1122 -- -- -- 61 20 96 % -- --  05/13/23 1117 136/66 -- -- 61 17 96 % -- --  05/13/23 1112 -- -- -- 64 17 97 % -- --  05/13/23 1107 -- -- -- 63 15 97 % -- --  05/13/23 1102 (!) 142/66 -- -- 65 16 97 % -- --  05/13/23 1057 -- -- -- 66 12 99 % -- --  05/13/23 1052 -- -- -- 66 13 99 % -- --  05/13/23 1047 138/65 -- -- 67 17 100 % -- --  05/13/23 1042 -- -- -- 74 -- (!) 87 % -- --  05/13/23 1014 -- -- -- -- -- -- 5\' 2"  (1.575 m) 67.1 kg  05/13/23 1007 138/69 98.1 F (36.7 C) Oral 67 18 99 % -- --  .  RECENT EKG RESULTS:    Orders placed or performed in visit on 04/22/23   EKG 12-Lead    DISCHARGE INSTRUCTIONS:    DISCHARGE MEDICATIONS:   Allergies as of 05/14/2023       Reactions   Pseudoeph-hydrocodone-gg Other (See Comments)   hallucinations   Ivp Dye [iodinated Contrast Media] Hives   Oxycodone Other (See Comments)   hallucinations   Sulfa Antibiotics Swelling   Tape Other (See Comments)   Unknown; tears skin ; ok to use paper tape         Medication List     TAKE these medications    allopurinol 100 MG tablet Commonly  known as: ZYLOPRIM TAKE 1 TABLET BY MOUTH ONCE DAILY   Aspirin Low Dose 81 MG chewable tablet Generic drug: aspirin Chew 81 mg by mouth daily.   Breztri Aerosphere 160-9-4.8 MCG/ACT Aero Generic drug:  Budeson-Glycopyrrol-Formoterol Inhale 2 puffs into the lungs in the morning and at bedtime.   budesonide-formoterol 160-4.5 MCG/ACT inhaler Commonly known as: Symbicort Take 2 puffs first thing in am and then another 2 puffs about 12 hours later.   clopidogrel 75 MG tablet Commonly known as: PLAVIX TAKE 1 TABLET(75 MG) BY MOUTH DAILY   cyanocobalamin 1000 MCG/ML injection Commonly known as: VITAMIN B12 Inject 1,000 mcg into the muscle every 30 (thirty) days.   ezetimibe 10 MG tablet Commonly known as: ZETIA TAKE 1 TABLET(10 MG) BY MOUTH DAILY   fluticasone 50 MCG/ACT nasal spray Commonly known as: FLONASE Place 1 spray into both nostrils daily.   folic acid 1 MG tablet Commonly known as: FOLVITE Take 1 mg by mouth daily.   furosemide 40 MG tablet Commonly known as: LASIX Take 40 mg by mouth daily as needed for fluid.   Hair/Skin/Nails Caps Take 1 tablet by mouth daily.   HYDROmorphone 2 MG tablet Commonly known as: Dilaudid Take 1 tablet (2 mg total) by mouth every 4 (four) hours as needed.   hydroxychloroquine 200 MG tablet Commonly known as: PLAQUENIL Take 300 mg by mouth daily.   loratadine 10 MG tablet Commonly known as: CLARITIN Take 10 mg by mouth daily.   methocarbamol 500 MG tablet Commonly known as: ROBAXIN Take 1 tablet (500 mg total) by mouth every 8 (eight) hours as needed for muscle spasms. What changed: Another medication with the same name was added. Make sure you understand how and when to take each.   methocarbamol 500 MG tablet Commonly known as: ROBAXIN Take 1 tablet (500 mg total) by mouth every 8 (eight) hours as needed for muscle spasms. What changed: You were already taking a medication with the same name, and this prescription was added. Make sure you understand how and when to take each.   metoprolol succinate 25 MG 24 hr tablet Commonly known as: TOPROL-XL TAKE 1 TABLET(25 MG) BY MOUTH DAILY   Multi-Vitamin tablet Take 1 tablet by  mouth daily.   nitroGLYCERIN 0.4 MG SL tablet Commonly known as: NITROSTAT Place 1 tablet (0.4 mg total) under the tongue every 5 (five) minutes as needed for chest pain.   NONFORMULARY OR COMPOUNDED ITEM Vitamin E vaginal cream 200u/ml.  One ml pv three times weekly.   pantoprazole 40 MG tablet Commonly known as: PROTONIX TAKE 1 TABLET(40 MG) BY MOUTH DAILY   polyethylene glycol 17 g packet Commonly known as: MIRALAX / GLYCOLAX Take 17 g by mouth daily as needed for mild constipation.   predniSONE 5 MG tablet Commonly known as: DELTASONE Take 5 mg by mouth daily with breakfast.   predniSONE 1 MG tablet Commonly known as: DELTASONE Take 4 mg by mouth daily.   rosuvastatin 20 MG tablet Commonly known as: CRESTOR Take 1 tablet (20 mg total) by mouth daily.   sertraline 100 MG tablet Commonly known as: ZOLOFT Take 50 mg by mouth daily.   Systane Complete 0.6 % Soln Generic drug: Propylene Glycol Place 1 drop into both eyes 2 (two) times daily.   testosterone cypionate 200 MG/ML injection Commonly known as: DEPOTESTOSTERONE CYPIONATE Inject 200 mg into the muscle every 30 (thirty) days.   traMADol 50 MG tablet Commonly known as: Ultram Take 1 tablet (50 mg  total) by mouth every 6 (six) hours as needed for moderate pain. What changed:  how much to take when to take this additional instructions   Vitamin D3 50 MCG (2000 UT) Tabs Take 2,000 Units by mouth daily.        FOLLOW UP VISIT:    Follow-up Information     Francena Hanly, MD Follow up.   Specialty: Orthopedic Surgery Why: 05-26-2023 at 2:30 PM for -post-op Contact information: [address] [place] [address] [phone]                 DISCHARGE TO: Home   DISCHARGE CONDITION:  Eustaquio Maize Jeremiyah Cullens for Dr. Francena Hanly 05/14/2023, 7:47 AM

## 2023-05-14 NOTE — Evaluation (Signed)
Occupational Therapy Evaluation Patient Details Name: Tracey [name] MRN: [id number] DOB: 1945-12-27 Today's Date: 05/14/2023   History of Present Illness Pt is a 77yo female presenting s/p RT rTSA on 05/13/23. PMH: OA, CAD, hx of breast ca with b/l total mastectomy, GERD, gout, HTN hyperlipidemia, NSTEMI, tachycardia, R-TKA 2019; L-TKA on 12/02/21.   Clinical Impression   Pt now s/p reverse shoulder replacement without functional use of RT dominant upper extremity secondary to effects of surgery and interscalene block and shoulder precautions. Therapist provided education and instruction to patient and spouse in regards to exercises, precautions, positioning, donning upper extremity clothing and bathing while maintaining shoulder precautions, ice and edema management and donning/doffing sling. Patient and spouse verbalized understanding and demonstrated as needed.   Patient needed assistance to donn shirt, underwear, pants, socks and shoes and provided with instruction on compensatory strategies to perform ADLs. Pt required frequent redirection to task. Very easily distracted.  Patient limited by decreased ROM in RT shoulder so therefore will need some form of assistance at home. Patient and spouse verbalized and/or demonstrated understanding to all instruction. Patient to follow up with MD for further therapy needs.         If plan is discharge home, recommend the following: A little help with bathing/dressing/bathroom;Assist for transportation;Assistance with cooking/housework    Functional Status Assessment  Patient has had a recent decline in their functional status and demonstrates the ability to make significant improvements in function in a reasonable and predictable amount of time.  Equipment Recommendations  None recommended by OT    Recommendations for Other Services       Precautions / Restrictions Precautions Precautions: Shoulder Shoulder Interventions: Shoulder  sling/immobilizer Precaution Booklet Issued: Yes (comment) Precaution Comments: No BP to UEs Required Braces or Orthoses: Sling Restrictions Weight Bearing Restrictions: Yes RUE Weight Bearing: Non weight bearing Other Position/Activity Restrictions: If sitting in controlled environment, ok to come out of sling to give neck a break. Please sleep in it to protect until follow up in office.     OK to use operative arm for feeding, hygiene and ADLs.   Ok to instruct Pendulums and lap slides as exercises. Ok to use operative arm within the following parameters for ADL purposes     New ROM (2/54)   Ok for PROM, AAROM, AROM within pain tolerance and within the following ROM   ER 20   ABD 45   FE 60      Mobility Bed Mobility               General bed mobility comments: Pt up in chair. Edcucated on NWB to RUE during any bed or other mobility.    Transfers Overall transfer level: Needs assistance   Transfers: Bed to chair/wheelchair/BSC, Sit to/from Stand Sit to Stand: Supervision     Step pivot transfers: Supervision            Balance Overall balance assessment: Mild deficits observed, not formally tested                                         ADL either performed or assessed with clinical judgement   ADL Overall ADL's : Needs assistance/impaired Eating/Feeding: Set up;Sitting   Grooming: Set up;Sitting;Standing   Upper Body Bathing: Minimal assistance;Moderate assistance;Cueing for compensatory techniques;Cueing for sequencing;Cueing for UE precautions;With caregiver independent assisting;Cueing for safety;Sitting;Standing   Lower Body Bathing:  Minimal assistance;Cueing for compensatory techniques;Sit to/from stand;Sitting/lateral leans;With caregiver independent assisting   Upper Body Dressing : Moderate assistance;Cueing for UE precautions;Cueing for compensatory techniques;Cueing for sequencing;Adhering to UE precautions;With caregiver independent  assisting   Lower Body Dressing: Minimal assistance;Sitting/lateral leans;Sit to/from stand;Cueing for sequencing;Cueing for compensatory techniques;With caregiver independent assisting   Toilet Transfer: Supervision/safety;Ambulation   Toileting- Clothing Manipulation and Hygiene: Minimal assistance;Sitting/lateral lean;Sit to/from stand;With caregiver independent assisting;Cueing for compensatory techniques Toileting - Clothing Manipulation Details (indicate cue type and reason): Has bidet     Functional mobility during ADLs: Supervision/safety       Vision   Vision Assessment?: No apparent visual deficits     Perception         Praxis         Pertinent Vitals/Pain Pain Assessment Pain Assessment: 0-10 Pain Score: 2  Pain Location: RT shoulder Pain Descriptors / Indicators: Aching Pain Intervention(s): Limited activity within patient's tolerance, Monitored during session, Repositioned     Extremity/Trunk Assessment Upper Extremity Assessment Upper Extremity Assessment:  (LUE WFL) RUE: Unable to fully assess due to immobilization RUE Sensation: decreased light touch   Lower Extremity Assessment Lower Extremity Assessment: Overall WFL for tasks assessed       Communication Communication Communication: No apparent difficulties Cueing Techniques: Verbal cues;Visual cues;Tactile cues   Cognition Arousal: Alert Behavior During Therapy: WFL for tasks assessed/performed, Anxious Overall Cognitive Status: Within Functional Limits for tasks assessed                                 General Comments: Required frequent redirection to education.     General Comments       Exercises Other Exercises Other Exercises: Pt/family educated in HEP for hand/elbow/forearm and wrist. Pt demonstrated back corectly. Handout of frequency provided.   Shoulder Instructions Shoulder Instructions Donning/doffing shirt without moving shoulder: Moderate  assistance;Caregiver independent with task Method for sponge bathing under operated UE: Minimal assistance;Caregiver independent with task Donning/doffing sling/immobilizer: Moderate assistance;Caregiver independent with task Correct positioning of sling/immobilizer: Minimal assistance;Caregiver independent with task ROM for elbow, wrist and digits of operated UE: Supervision/safety;Caregiver independent with task Sling wearing schedule (on at all times/off for ADL's): Supervision/safety;Caregiver independent with task Proper positioning of operated UE when showering: Supervision/safety;Caregiver independent with task Positioning of UE while sleeping: Supervision/safety;Caregiver independent with task    Home Living Family/patient expects to be discharged to:: Private residence Living Arrangements: Spouse/significant other Available Help at Discharge: Available 24 hours/day;Family Type of Home: House Home Access: Stairs to enter Secretary/administrator of Steps: 2 Entrance Stairs-Rails: None Home Layout: Two level;Bed/bath upstairs Alternate Level Stairs-Number of Steps: flight Alternate Level Stairs-Rails: Right Bathroom Shower/Tub: Producer, television/film/video: Handicapped height     Home Equipment: Shower seat - built in;Cane - single point;BSC/3in1;Hand held shower head   Additional Comments: bidet      Prior Functioning/Environment Prior Level of Function : Driving;Independent/Modified Independent             Mobility Comments: SPC used to walk her dogs. or PRN ADLs Comments: Independent.        OT Problem List: Decreased range of motion;Decreased knowledge of precautions;Impaired sensation;Decreased strength      OT Treatment/Interventions:      OT Goals(Current goals can be found in the care plan section) Acute Rehab OT Goals OT Goal Formulation: All assessment and education complete, DC therapy ADL Goals Pt/caregiver will Perform Home Exercise Program:  With written HEP provided;Right Upper extremity;Increased ROM (per shoulder protocol) Additional ADL Goal #1: Pt will demonstrate UE/LE dressing, donning/doffing of sling, correct positioning of RT UE, and compensatory strategies for RT axilla hygiene, as well as use of Ice man cryo cuff, while correctly following all shoulder post-op precautions/restrictions.  OT Frequency:      Co-evaluation              AM-PAC OT "6 Clicks" Daily Activity     Outcome Measure Help from another person eating meals?: A Little Help from another person taking care of personal grooming?: A Little Help from another person toileting, which includes using toliet, bedpan, or urinal?: A Little Help from another person bathing (including washing, rinsing, drying)?: A Lot Help from another person to put on and taking off regular upper body clothing?: A Lot Help from another person to put on and taking off regular lower body clothing?: A Little 6 Click Score: 16   End of Session Equipment Utilized During Treatment:  (sling) Nurse Communication: Other (comment) (Ot completed)  Activity Tolerance: Patient tolerated treatment well Patient left: in chair;with family/visitor present  OT Visit Diagnosis: Muscle weakness (generalized) (M62.81)                Time: 0981-1914 OT Time Calculation (min): 49 min Charges:  OT General Charges $OT Visit: 1 Visit OT Evaluation $OT Eval Low Complexity: 1 Low OT Treatments $Self Care/Home Management : 23-37 mins  Victorino Dike, OT Acute Rehab Services Office: [phone] 05/14/2023   Theodoro Clock 05/14/2023, 12:42 PM

## 2023-05-14 NOTE — Progress Notes (Signed)
   05/14/23 0838  TOC Brief Assessment  Insurance and Status Reviewed  Patient has primary care physician Yes  Home environment has been reviewed Resides with spouse  Prior level of function: Independent at baseline  Prior/Current Home Services No current home services  Social Determinants of Health Reivew SDOH reviewed no interventions necessary  Readmission risk has been reviewed Yes  Transition of care needs no transition of care needs at this time

## 2023-05-16 ENCOUNTER — Emergency Department (HOSPITAL_BASED_OUTPATIENT_CLINIC_OR_DEPARTMENT_OTHER): Payer: Medicare Other | Admitting: Radiology

## 2023-05-16 ENCOUNTER — Other Ambulatory Visit: Payer: Self-pay

## 2023-05-16 ENCOUNTER — Emergency Department (HOSPITAL_BASED_OUTPATIENT_CLINIC_OR_DEPARTMENT_OTHER): Payer: Medicare Other

## 2023-05-16 ENCOUNTER — Encounter (HOSPITAL_BASED_OUTPATIENT_CLINIC_OR_DEPARTMENT_OTHER): Payer: Self-pay | Admitting: Emergency Medicine

## 2023-05-16 ENCOUNTER — Inpatient Hospital Stay (HOSPITAL_BASED_OUTPATIENT_CLINIC_OR_DEPARTMENT_OTHER)
Admission: EM | Admit: 2023-05-16 | Discharge: 2023-05-20 | DRG: 193 | Disposition: A | Discharge status: Home / Self Care | Payer: Medicare Other | Attending: Internal Medicine | Admitting: Internal Medicine

## 2023-05-16 DIAGNOSIS — Z7902 Long term (current) use of antithrombotics/antiplatelets: Secondary | ICD-10-CM

## 2023-05-16 DIAGNOSIS — Z91048 Other nonmedicinal substance allergy status: Secondary | ICD-10-CM | POA: Diagnosis not present

## 2023-05-16 DIAGNOSIS — Z91041 Radiographic dye allergy status: Secondary | ICD-10-CM

## 2023-05-16 DIAGNOSIS — J90 Pleural effusion, not elsewhere classified: Secondary | ICD-10-CM | POA: Diagnosis not present

## 2023-05-16 DIAGNOSIS — I25119 Atherosclerotic heart disease of native coronary artery with unspecified angina pectoris: Secondary | ICD-10-CM | POA: Diagnosis present

## 2023-05-16 DIAGNOSIS — Z8249 Family history of ischemic heart disease and other diseases of the circulatory system: Secondary | ICD-10-CM

## 2023-05-16 DIAGNOSIS — J189 Pneumonia, unspecified organism: Principal | ICD-10-CM | POA: Diagnosis present

## 2023-05-16 DIAGNOSIS — Z471 Aftercare following joint replacement surgery: Secondary | ICD-10-CM | POA: Diagnosis not present

## 2023-05-16 DIAGNOSIS — Z885 Allergy status to narcotic agent status: Secondary | ICD-10-CM

## 2023-05-16 DIAGNOSIS — R0902 Hypoxemia: Principal | ICD-10-CM

## 2023-05-16 DIAGNOSIS — E785 Hyperlipidemia, unspecified: Secondary | ICD-10-CM | POA: Diagnosis not present

## 2023-05-16 DIAGNOSIS — Z79899 Other long term (current) drug therapy: Secondary | ICD-10-CM | POA: Diagnosis not present

## 2023-05-16 DIAGNOSIS — R0603 Acute respiratory distress: Secondary | ICD-10-CM | POA: Diagnosis not present

## 2023-05-16 DIAGNOSIS — Z955 Presence of coronary angioplasty implant and graft: Secondary | ICD-10-CM | POA: Diagnosis not present

## 2023-05-16 DIAGNOSIS — Z96653 Presence of artificial knee joint, bilateral: Secondary | ICD-10-CM | POA: Diagnosis present

## 2023-05-16 DIAGNOSIS — I10 Essential (primary) hypertension: Secondary | ICD-10-CM | POA: Diagnosis not present

## 2023-05-16 DIAGNOSIS — D638 Anemia in other chronic diseases classified elsewhere: Secondary | ICD-10-CM | POA: Diagnosis present

## 2023-05-16 DIAGNOSIS — Z1152 Encounter for screening for COVID-19: Secondary | ICD-10-CM | POA: Diagnosis not present

## 2023-05-16 DIAGNOSIS — M353 Polymyalgia rheumatica: Secondary | ICD-10-CM | POA: Diagnosis not present

## 2023-05-16 DIAGNOSIS — M7989 Other specified soft tissue disorders: Secondary | ICD-10-CM | POA: Diagnosis not present

## 2023-05-16 DIAGNOSIS — Z7951 Long term (current) use of inhaled steroids: Secondary | ICD-10-CM

## 2023-05-16 DIAGNOSIS — R9389 Abnormal findings on diagnostic imaging of other specified body structures: Secondary | ICD-10-CM | POA: Diagnosis not present

## 2023-05-16 DIAGNOSIS — J9601 Acute respiratory failure with hypoxia: Secondary | ICD-10-CM | POA: Diagnosis present

## 2023-05-16 DIAGNOSIS — Z7952 Long term (current) use of systemic steroids: Secondary | ICD-10-CM | POA: Diagnosis not present

## 2023-05-16 DIAGNOSIS — R0602 Shortness of breath: Secondary | ICD-10-CM | POA: Diagnosis not present

## 2023-05-16 DIAGNOSIS — Z9013 Acquired absence of bilateral breasts and nipples: Secondary | ICD-10-CM | POA: Diagnosis not present

## 2023-05-16 DIAGNOSIS — Z90710 Acquired absence of both cervix and uterus: Secondary | ICD-10-CM

## 2023-05-16 DIAGNOSIS — E876 Hypokalemia: Secondary | ICD-10-CM | POA: Diagnosis present

## 2023-05-16 DIAGNOSIS — D649 Anemia, unspecified: Secondary | ICD-10-CM | POA: Diagnosis not present

## 2023-05-16 DIAGNOSIS — Z87891 Personal history of nicotine dependence: Secondary | ICD-10-CM | POA: Diagnosis not present

## 2023-05-16 DIAGNOSIS — G4733 Obstructive sleep apnea (adult) (pediatric): Secondary | ICD-10-CM | POA: Diagnosis not present

## 2023-05-16 DIAGNOSIS — I252 Old myocardial infarction: Secondary | ICD-10-CM

## 2023-05-16 DIAGNOSIS — Z803 Family history of malignant neoplasm of breast: Secondary | ICD-10-CM

## 2023-05-16 DIAGNOSIS — I700 Atherosclerosis of aorta: Secondary | ICD-10-CM | POA: Diagnosis not present

## 2023-05-16 DIAGNOSIS — Z7982 Long term (current) use of aspirin: Secondary | ICD-10-CM | POA: Diagnosis not present

## 2023-05-16 DIAGNOSIS — J168 Pneumonia due to other specified infectious organisms: Secondary | ICD-10-CM | POA: Diagnosis not present

## 2023-05-16 DIAGNOSIS — I2510 Atherosclerotic heart disease of native coronary artery without angina pectoris: Secondary | ICD-10-CM | POA: Diagnosis not present

## 2023-05-16 DIAGNOSIS — Z96611 Presence of right artificial shoulder joint: Secondary | ICD-10-CM | POA: Diagnosis present

## 2023-05-16 DIAGNOSIS — Z882 Allergy status to sulfonamides status: Secondary | ICD-10-CM | POA: Diagnosis not present

## 2023-05-16 DIAGNOSIS — Z853 Personal history of malignant neoplasm of breast: Secondary | ICD-10-CM | POA: Diagnosis not present

## 2023-05-16 DIAGNOSIS — R918 Other nonspecific abnormal finding of lung field: Secondary | ICD-10-CM | POA: Diagnosis not present

## 2023-05-16 DIAGNOSIS — J9811 Atelectasis: Secondary | ICD-10-CM | POA: Diagnosis not present

## 2023-05-16 LAB — CBC WITH DIFFERENTIAL/PLATELET
Abs Immature Granulocytes: 0.05 10*3/uL (ref 0.00–0.07)
Basophils Absolute: 0 10*3/uL (ref 0.0–0.1)
Basophils Relative: 0 %
Eosinophils Absolute: 0.1 10*3/uL (ref 0.0–0.5)
Eosinophils Relative: 1 %
HCT: 34.3 % — ABNORMAL LOW (ref 36.0–46.0)
Hemoglobin: 11.3 g/dL — ABNORMAL LOW (ref 12.0–15.0)
Immature Granulocytes: 1 %
Lymphocytes Relative: 12 %
Lymphs Abs: 1.1 10*3/uL (ref 0.7–4.0)
MCH: 29.9 pg (ref 26.0–34.0)
MCHC: 32.9 g/dL (ref 30.0–36.0)
MCV: 90.7 fL (ref 80.0–100.0)
Monocytes Absolute: 0.8 10*3/uL (ref 0.1–1.0)
Monocytes Relative: 9 %
Neutro Abs: 6.8 10*3/uL (ref 1.7–7.7)
Neutrophils Relative %: 77 %
Platelets: 201 10*3/uL (ref 150–400)
RBC: 3.78 MIL/uL — ABNORMAL LOW (ref 3.87–5.11)
RDW: 13.5 % (ref 11.5–15.5)
WBC: 8.8 10*3/uL (ref 4.0–10.5)
nRBC: 0 % (ref 0.0–0.2)

## 2023-05-16 LAB — COMPREHENSIVE METABOLIC PANEL
ALT: 19 U/L (ref 0–44)
AST: 25 U/L (ref 15–41)
Albumin: 3.8 g/dL (ref 3.5–5.0)
Alkaline Phosphatase: 51 U/L (ref 38–126)
Anion gap: 9 (ref 5–15)
BUN: 19 mg/dL (ref 8–23)
CO2: 27 mmol/L (ref 22–32)
Calcium: 9.1 mg/dL (ref 8.9–10.3)
Chloride: 103 mmol/L (ref 98–111)
Creatinine, Ser: 1.07 mg/dL — ABNORMAL HIGH (ref 0.44–1.00)
GFR, Estimated: 53 mL/min — ABNORMAL LOW (ref >60)
Glucose, Bld: 102 mg/dL — ABNORMAL HIGH (ref 70–99)
Potassium: 4.3 mmol/L (ref 3.5–5.1)
Sodium: 139 mmol/L (ref 135–145)
Total Bilirubin: 0.5 mg/dL (ref 0.3–1.2)
Total Protein: 6.3 g/dL — ABNORMAL LOW (ref 6.5–8.1)

## 2023-05-16 LAB — RESP PANEL BY RT-PCR (RSV, FLU A&B, COVID)  RVPGX2
Influenza A by PCR: NEGATIVE
Influenza B by PCR: NEGATIVE
Resp Syncytial Virus by PCR: NEGATIVE
SARS Coronavirus 2 by RT PCR: NEGATIVE

## 2023-05-16 LAB — D-DIMER, QUANTITATIVE: D-Dimer, Quant: 1.51 ug{FEU}/mL — ABNORMAL HIGH (ref 0.00–0.50)

## 2023-05-16 MED ORDER — HYDROCORTISONE SOD SUC (PF) 100 MG IJ SOLR
200.0000 mg | Freq: Once | INTRAMUSCULAR | Status: AC
  Administered 2023-05-16: 200 mg via INTRAVENOUS
  Filled 2023-05-16: qty 4

## 2023-05-16 MED ORDER — DIPHENHYDRAMINE HCL 50 MG/ML IJ SOLN
50.0000 mg | Freq: Once | INTRAMUSCULAR | Status: AC
  Administered 2023-05-16: 50 mg via INTRAVENOUS
  Filled 2023-05-16: qty 1

## 2023-05-16 MED ORDER — TRAMADOL HCL 50 MG PO TABS
100.0000 mg | ORAL_TABLET | Freq: Once | ORAL | Status: AC
  Administered 2023-05-16: 100 mg via ORAL
  Filled 2023-05-16: qty 2

## 2023-05-16 MED ORDER — PREDNISONE 50 MG PO TABS
50.0000 mg | ORAL_TABLET | Freq: Once | ORAL | Status: AC
  Administered 2023-05-16: 50 mg via ORAL
  Filled 2023-05-16: qty 1

## 2023-05-16 NOTE — ED Notes (Signed)
Pt states that she is feeling a little sob. Typically wears a c-pap at night. 02 placed at 2l via n/c placed. States she feels better. RT notified.

## 2023-05-16 NOTE — ED Notes (Signed)
Josh paramedic attempted I/V ultrasound times two without success. Pt tolerated well.

## 2023-05-16 NOTE — ED Triage Notes (Signed)
Post op (surgery thurday) shoulder replacement Reports low spo2 at home, sob and congestion.

## 2023-05-16 NOTE — ED Provider Notes (Signed)
[place] EMERGENCY DEPARTMENT AT [facility] Provider Note   CSN: [id number] Arrival date & time: 05/16/23  1645     History {Add pertinent medical, surgical, social history, OB history to HPI:1} Chief Complaint  Patient presents with   Shortness of Breath    Tracey [name] is a 77 y.o. female with a past medical history of breast cancer in remission, CAD, OSA, PMR, and status post right total shoulder arthroplasty who presents to the ED for shortness of breath.  Patient underwent reverse total shoulder arthroplasty of the right shoulder 3 days ago (Dr. Rennis Chris, [facility]).  She has been recovering at home since that time.  Beginning yesterday she began to feel subjective shortness of breath is along with subjective fevers.  Pulse ox has read lower than normal for her in the 70s and 80s at home.  Her daughter who is a Engineer, civil (consulting) became concerned that this finding and brought her to the ED tonight.  Patient does note productive coughing with green sputum as well.  No chest pain, abdominal pain, nausea, vomiting.   Shortness of Breath Associated symptoms: cough and fever        Home Medications Prior to Admission medications   Medication Sig Start Date End Date Taking? Authorizing Provider  allopurinol (ZYLOPRIM) 100 MG tablet TAKE 1 TABLET BY MOUTH ONCE DAILY 06/25/16   Magrinat, Valentino Hue, MD  ASPIRIN LOW DOSE 81 MG chewable tablet Chew 81 mg by mouth daily.  09/19/17   [provider]  Budeson-Glycopyrrol-Formoterol (BREZTRI AEROSPHERE) 160-9-4.8 MCG/ACT AERO Inhale 2 puffs into the lungs in the morning and at bedtime. 06/19/22   Nyoka Cowden, MD  budesonide-formoterol (SYMBICORT) 160-4.5 MCG/ACT inhaler Take 2 puffs first thing in am and then another 2 puffs about 12 hours later. 06/19/22   Nyoka Cowden, MD  Cholecalciferol (VITAMIN D3) 50 MCG (2000 UT) TABS Take 2,000 Units by mouth daily.    [provider]  clopidogrel (PLAVIX) 75 MG tablet TAKE 1  TABLET(75 MG) BY MOUTH DAILY 03/17/23   Tonny Bollman, MD  cyanocobalamin (,VITAMIN B-12,) 1000 MCG/ML injection Inject 1,000 mcg into the muscle every 30 (thirty) days.    [provider]  ezetimibe (ZETIA) 10 MG tablet TAKE 1 TABLET(10 MG) BY MOUTH DAILY 03/17/23   Tonny Bollman, MD  fluticasone [facility]) 50 MCG/ACT nasal spray Place 1 spray into both nostrils daily. 01/18/13   [provider]  folic acid (FOLVITE) 1 MG tablet Take 1 mg by mouth daily.    [provider]  furosemide (LASIX) 40 MG tablet Take 40 mg by mouth daily as needed for fluid.  04/01/13   [provider]  HYDROmorphone (DILAUDID) 2 MG tablet Take 1 tablet (2 mg total) by mouth every 4 (four) hours as needed. 05/13/23   Shuford, French Ana, PA-C  hydroxychloroquine (PLAQUENIL) 200 MG tablet Take 300 mg by mouth daily.    [provider]  loratadine (CLARITIN) 10 MG tablet Take 10 mg by mouth daily.    [provider]  methocarbamol (ROBAXIN) 500 MG tablet Take 1 tablet (500 mg total) by mouth every 8 (eight) hours as needed for muscle spasms. Patient not taking: Reported on 04/28/2023 12/31/21   Regalado, Jon Billings A, MD  methocarbamol (ROBAXIN) 500 MG tablet Take 1 tablet (500 mg total) by mouth every 8 (eight) hours as needed for muscle spasms. 05/13/23   Shuford, French Ana, PA-C  metoprolol succinate (TOPROL-XL) 25 MG 24 hr tablet TAKE 1 TABLET(25 MG)  BY MOUTH DAILY 06/16/22   Tonny Bollman, MD  Multiple Vitamin (MULTI-VITAMIN) tablet Take 1 tablet by mouth daily.    [provider]  Multiple Vitamins-Minerals (HAIR/SKIN/NAILS) CAPS Take 1 tablet by mouth daily.    [provider]  nitroGLYCERIN (NITROSTAT) 0.4 MG SL tablet Place 1 tablet (0.4 mg total) under the tongue every 5 (five) minutes as needed for chest pain. 06/02/21   Alver Sorrow, NP  NONFORMULARY OR COMPOUNDED ITEM Vitamin E vaginal cream 200u/ml.  One ml pv three times weekly. Patient not taking:  Reported on 04/28/2023 09/11/19   Jerene Bears, MD  pantoprazole (PROTONIX) 40 MG tablet TAKE 1 TABLET(40 MG) BY MOUTH DAILY 05/12/23   Tonny Bollman, MD  polyethylene glycol (MIRALAX / GLYCOLAX) 17 g packet Take 17 g by mouth daily as needed for mild constipation. 12/03/21   Cassandria Anger, PA-C  predniSONE (DELTASONE) 1 MG tablet Take 4 mg by mouth daily. 05/21/22   [provider]  predniSONE (DELTASONE) 5 MG tablet Take 5 mg by mouth daily with breakfast.    [provider]  Propylene Glycol (SYSTANE COMPLETE) 0.6 % SOLN Place 1 drop into both eyes 2 (two) times daily.    [provider]  rosuvastatin (CRESTOR) 20 MG tablet Take 1 tablet (20 mg total) by mouth daily. 12/07/22   Tonny Bollman, MD  sertraline (ZOLOFT) 100 MG tablet Take 50 mg by mouth daily. 12/06/20   [provider]  testosterone cypionate (DEPOTESTOSTERONE CYPIONATE) 200 MG/ML injection Inject 200 mg into the muscle every 30 (thirty) days. 01/17/19   [provider]      Allergies    Pseudoeph-hydrocodone-gg, Ivp dye [iodinated contrast media], Oxycodone, Sulfa antibiotics, and Tape    Review of Systems   Review of Systems  Constitutional:  Positive for fever.  Respiratory:  Positive for cough and shortness of breath.   All other systems reviewed and are negative.   Physical Exam Updated Vital Signs BP (!) 132/53 (BP Location: Left Arm)   Pulse 74   Temp 98.3 F (36.8 C)   Resp 18   LMP 08/17/1977 (Approximate)   SpO2 96%  Physical Exam Vitals and nursing note reviewed.  HENT:     Head: Normocephalic and atraumatic.  Eyes:     Pupils: Pupils are equal, round, and reactive to light.  Cardiovascular:     Rate and Rhythm: Normal rate and regular rhythm.  Pulmonary:     Effort: Pulmonary effort is normal.     Breath sounds: Normal breath sounds.     Comments: Diminished breath sounds at right base with no wheezing rales or rhonchi Abdominal:     Palpations:  Abdomen is soft.     Tenderness: There is no abdominal tenderness.  Musculoskeletal:     Comments: Postoperative bandage over right anterior shoulder with mild surrounding erythema No fluctuance or drainage Right arm sling in place  Skin:    General: Skin is warm and dry.  Neurological:     Mental Status: She is alert.  Psychiatric:        Mood and Affect: Mood normal.     ED Results / Procedures / Treatments   Labs (all labs ordered are listed, but only abnormal results are displayed) Labs Reviewed  RESP PANEL BY RT-PCR (RSV, FLU A&B, COVID)  [id number]  CULTURE, BLOOD (ROUTINE X 2)  CULTURE, BLOOD (ROUTINE X 2)  COMPREHENSIVE METABOLIC PANEL  CBC WITH DIFFERENTIAL/PLATELET  D-DIMER, QUANTITATIVE  EKG None  Radiology DG Chest Port 1 View  Result Date: 05/16/2023 CLINICAL DATA:  Shortness of breath. EXAM: PORTABLE CHEST 1 VIEW COMPARISON:  Chest radiograph dated 06/19/2022. FINDINGS: Right lung base opacity, likely combination of pleural effusion and associated atelectasis or infiltrate. The left lung is clear. No pneumothorax. Stable cardiac silhouette. Coronary vascular stent. Right shoulder arthroplasty. No acute osseous pathology. IMPRESSION: Right pleural effusion and right lung base atelectasis or infiltrate, new since the prior radiograph. Electronically Signed   By: Elgie Collard M.D.   On: 05/16/2023 18:46    Procedures Procedures  {Document cardiac monitor, telemetry assessment procedure when appropriate:1}  Medications Ordered in ED Medications - No data to display  ED Course/ Medical Decision Making/ A&P Clinical Course as of 05/16/23 2013  Sun May 16, 2023  1923 DG Chest [place] 1 View [MP]    Clinical Course User Index [MP] Royanne Foots, DO   {   Click here for ABCD2, HEART and other calculatorsREFRESH Note before signing :1}                              Medical Decision Making 77 year old female with history as above including right total  shoulder arthroplasty 3 days ago returns for shortness of breath, hypoxia on home pulse oximetry, productive coughing and subjective fevers.  Afebrile and normotensive here.  No tachycardia.  Exam notable for diminished breath sounds on the right.  Postoperative site looks okay.  Differential diagnosis includes healthcare associated pneumonia in the setting of recent orthopedic operation.  Further differentials would include viral respiratory illness and pulmonary embolism.  She is certainly at risk for pulmonary embolism in the setting of recent orthopedic operation and history of breast cancer.  Will start with a D-dimer and obtain a CTA of the chest if elevated.  Will obtain blood cultures CBC and clinical with panel.  Admission likely.  Amount and/or Complexity of Data Reviewed Labs: ordered. Radiology: ordered. Decision-making details documented in ED Course.   ***  {Document critical care time when appropriate:1} {Document review of labs and clinical decision tools ie heart score, Chads2Vasc2 etc:1}  {Document your independent review of radiology images, and any outside records:1} {Document your discussion with family members, caretakers, and with consultants:1} {Document social determinants of health affecting pt's care:1} {Document your decision making why or why not admission, treatments were needed:1} Final Clinical Impression(s) / ED Diagnoses Final diagnoses:  None    Rx / DC Orders ED Discharge Orders     None

## 2023-05-16 NOTE — ED Provider Notes (Incomplete)
[facility] AT [facility] Provider Note   CSN: [id number] Arrival date & time: 05/16/23  1645     History {Add pertinent medical, surgical, social history, OB history to HPI:1} Chief Complaint  Patient presents with  . Shortness of Breath    Tracey [name] is a 77 y.o. female with a past medical history of breast cancer in remission, CAD, OSA, PMR, and status post right total shoulder arthroplasty who presents to the ED for shortness of breath.  Patient underwent reverse total shoulder arthroplasty of the right shoulder 3 days ago (Dr. Rennis Chris, [facility]).  She has been recovering at home since that time.  Beginning yesterday she began to feel subjective shortness of breath is along with subjective fevers.  Pulse ox has read lower than normal for her in the 70s and 80s at home.  Her daughter who is a Engineer, civil (consulting) became concerned that this finding and brought her to the ED tonight.  Patient does note productive coughing with green sputum as well.  No chest pain, abdominal pain, nausea, vomiting.   Shortness of Breath Associated symptoms: cough and fever        Home Medications Prior to Admission medications   Medication Sig Start Date End Date Taking? Authorizing Provider  allopurinol (ZYLOPRIM) 100 MG tablet TAKE 1 TABLET BY MOUTH ONCE DAILY 06/25/16   Magrinat, Valentino Hue, MD  ASPIRIN LOW DOSE 81 MG chewable tablet Chew 81 mg by mouth daily.  09/19/17   [provider]  Budeson-Glycopyrrol-Formoterol (BREZTRI AEROSPHERE) 160-9-4.8 MCG/ACT AERO Inhale 2 puffs into the lungs in the morning and at bedtime. 06/19/22   Nyoka Cowden, MD  budesonide-formoterol (SYMBICORT) 160-4.5 MCG/ACT inhaler Take 2 puffs first thing in am and then another 2 puffs about 12 hours later. 06/19/22   Nyoka Cowden, MD  Cholecalciferol (VITAMIN D3) 50 MCG (2000 UT) TABS Take 2,000 Units by mouth daily.    [provider]  clopidogrel (PLAVIX) 75 MG tablet TAKE 1  TABLET(75 MG) BY MOUTH DAILY 03/17/23   Tonny Bollman, MD  cyanocobalamin (,VITAMIN B-12,) 1000 MCG/ML injection Inject 1,000 mcg into the muscle every 30 (thirty) days.    [provider]  ezetimibe (ZETIA) 10 MG tablet TAKE 1 TABLET(10 MG) BY MOUTH DAILY 03/17/23   Tonny Bollman, MD  fluticasone [facility]) 50 MCG/ACT nasal spray Place 1 spray into both nostrils daily. 01/18/13   [provider]  folic acid (FOLVITE) 1 MG tablet Take 1 mg by mouth daily.    [provider]  furosemide (LASIX) 40 MG tablet Take 40 mg by mouth daily as needed for fluid.  04/01/13   [provider]  HYDROmorphone (DILAUDID) 2 MG tablet Take 1 tablet (2 mg total) by mouth every 4 (four) hours as needed. 05/13/23   Shuford, French Ana, PA-C  hydroxychloroquine (PLAQUENIL) 200 MG tablet Take 300 mg by mouth daily.    [provider]  loratadine (CLARITIN) 10 MG tablet Take 10 mg by mouth daily.    [provider]  methocarbamol (ROBAXIN) 500 MG tablet Take 1 tablet (500 mg total) by mouth every 8 (eight) hours as needed for muscle spasms. Patient not taking: Reported on 04/28/2023 12/31/21   Regalado, Jon Billings A, MD  methocarbamol (ROBAXIN) 500 MG tablet Take 1 tablet (500 mg total) by mouth every 8 (eight) hours as needed for muscle spasms. 05/13/23   Shuford, French Ana, PA-C  metoprolol succinate (TOPROL-XL) 25 MG 24 hr tablet TAKE 1 TABLET(25 MG)  BY MOUTH DAILY 06/16/22   Tonny Bollman, MD  Multiple Vitamin (MULTI-VITAMIN) tablet Take 1 tablet by mouth daily.    [provider]  Multiple Vitamins-Minerals (HAIR/SKIN/NAILS) CAPS Take 1 tablet by mouth daily.    [provider]  nitroGLYCERIN (NITROSTAT) 0.4 MG SL tablet Place 1 tablet (0.4 mg total) under the tongue every 5 (five) minutes as needed for chest pain. 06/02/21   Alver Sorrow, NP  NONFORMULARY OR COMPOUNDED ITEM Vitamin E vaginal cream 200u/ml.  One ml pv three times weekly. Patient not taking:  Reported on 04/28/2023 09/11/19   Jerene Bears, MD  pantoprazole (PROTONIX) 40 MG tablet TAKE 1 TABLET(40 MG) BY MOUTH DAILY 05/12/23   Tonny Bollman, MD  polyethylene glycol (MIRALAX / GLYCOLAX) 17 g packet Take 17 g by mouth daily as needed for mild constipation. 12/03/21   Cassandria Anger, PA-C  predniSONE (DELTASONE) 1 MG tablet Take 4 mg by mouth daily. 05/21/22   [provider]  predniSONE (DELTASONE) 5 MG tablet Take 5 mg by mouth daily with breakfast.    [provider]  Propylene Glycol (SYSTANE COMPLETE) 0.6 % SOLN Place 1 drop into both eyes 2 (two) times daily.    [provider]  rosuvastatin (CRESTOR) 20 MG tablet Take 1 tablet (20 mg total) by mouth daily. 12/07/22   Tonny Bollman, MD  sertraline (ZOLOFT) 100 MG tablet Take 50 mg by mouth daily. 12/06/20   [provider]  testosterone cypionate (DEPOTESTOSTERONE CYPIONATE) 200 MG/ML injection Inject 200 mg into the muscle every 30 (thirty) days. 01/17/19   [provider]      Allergies    Pseudoeph-hydrocodone-gg, Ivp dye [iodinated contrast media], Oxycodone, Sulfa antibiotics, and Tape    Review of Systems   Review of Systems  Constitutional:  Positive for fever.  Respiratory:  Positive for cough and shortness of breath.   All other systems reviewed and are negative.   Physical Exam Updated Vital Signs BP (!) 132/53 (BP Location: Left Arm)   Pulse 74   Temp 98.3 F (36.8 C)   Resp 18   LMP 08/17/1977 (Approximate)   SpO2 96%  Physical Exam Vitals and nursing note reviewed.  HENT:     Head: Normocephalic and atraumatic.  Eyes:     Pupils: Pupils are equal, round, and reactive to light.  Cardiovascular:     Rate and Rhythm: Normal rate and regular rhythm.  Pulmonary:     Effort: Pulmonary effort is normal.     Breath sounds: Normal breath sounds.     Comments: Diminished breath sounds at right base with no wheezing rales or rhonchi Abdominal:     Palpations:  Abdomen is soft.     Tenderness: There is no abdominal tenderness.  Musculoskeletal:     Comments: Postoperative bandage over right anterior shoulder with mild surrounding erythema No fluctuance or drainage Right arm sling in place  Skin:    General: Skin is warm and dry.  Neurological:     Mental Status: She is alert.  Psychiatric:        Mood and Affect: Mood normal.     ED Results / Procedures / Treatments   Labs (all labs ordered are listed, but only abnormal results are displayed) Labs Reviewed  RESP PANEL BY RT-PCR (RSV, FLU A&B, COVID)  [id number]  CULTURE, BLOOD (ROUTINE X 2)  CULTURE, BLOOD (ROUTINE X 2)  COMPREHENSIVE METABOLIC PANEL  CBC WITH DIFFERENTIAL/PLATELET  D-DIMER, QUANTITATIVE  EKG None  Radiology DG Chest Port 1 View  Result Date: 05/16/2023 CLINICAL DATA:  Shortness of breath. EXAM: PORTABLE CHEST 1 VIEW COMPARISON:  Chest radiograph dated 06/19/2022. FINDINGS: Right lung base opacity, likely combination of pleural effusion and associated atelectasis or infiltrate. The left lung is clear. No pneumothorax. Stable cardiac silhouette. Coronary vascular stent. Right shoulder arthroplasty. No acute osseous pathology. IMPRESSION: Right pleural effusion and right lung base atelectasis or infiltrate, new since the prior radiograph. Electronically Signed   By: Elgie Collard M.D.   On: 05/16/2023 18:46    Procedures Ultrasound ED Peripheral IV (Provider)  Date/Time: 05/16/2023 10:18 PM  Performed by: Royanne Foots, DO Authorized by: Royanne Foots, DO   Procedure details:    Indications: multiple failed IV attempts     Skin Prep: chlorhexidine gluconate     Location:  Left AC   Angiocath:  18 G   Bedside Ultrasound Guided: Yes     Images: archived     Patient tolerated procedure without complications: Yes     Dressing applied: Yes     {Document cardiac monitor, telemetry assessment procedure when appropriate:1}  Medications Ordered in  ED Medications - No data to display  ED Course/ Medical Decision Making/ A&P Clinical Course as of 05/16/23 2218  Wynelle Link May 16, 2023  1923 DG Chest [place] 1 View [MP]  2138 D-Dimer, Quant(!): 1.51 [MP]    Clinical Course User Index [MP] Royanne Foots, DO   {   Click here for ABCD2, HEART and other calculatorsREFRESH Note before signing :1}                              Medical Decision Making 77 year old female with history as above including right total shoulder arthroplasty 3 days ago returns for shortness of breath, hypoxia on home pulse oximetry, productive coughing and subjective fevers.  Afebrile and normotensive here.  No tachycardia.  Exam notable for diminished breath sounds on the right.  Postoperative site looks okay.  Differential diagnosis includes healthcare associated pneumonia in the setting of recent orthopedic operation.  Further differentials would include viral respiratory illness and pulmonary embolism.  She is certainly at risk for pulmonary embolism in the setting of recent orthopedic operation and history of breast cancer.  Will start with a D-dimer and obtain a CTA of the chest if elevated.  Will obtain blood cultures CBC and clinical with panel.  Admission likely.  Amount and/or Complexity of Data Reviewed Labs: ordered. Decision-making details documented in ED Course. Radiology: ordered. Decision-making details documented in ED Course.  Risk Prescription drug management.   ***  {Document critical care time when appropriate:1} {Document review of labs and clinical decision tools ie heart score, Chads2Vasc2 etc:1}  {Document your independent review of radiology images, and any outside records:1} {Document your discussion with family members, caretakers, and with consultants:1} {Document social determinants of health affecting pt's care:1} {Document your decision making why or why not admission, treatments were needed:1} Final Clinical Impression(s) / ED  Diagnoses Final diagnoses:  None    Rx / DC Orders ED Discharge Orders     None

## 2023-05-17 ENCOUNTER — Emergency Department (HOSPITAL_BASED_OUTPATIENT_CLINIC_OR_DEPARTMENT_OTHER): Payer: Medicare Other

## 2023-05-17 ENCOUNTER — Observation Stay (HOSPITAL_BASED_OUTPATIENT_CLINIC_OR_DEPARTMENT_OTHER): Payer: Medicare Other

## 2023-05-17 ENCOUNTER — Other Ambulatory Visit: Payer: Self-pay

## 2023-05-17 DIAGNOSIS — Z96653 Presence of artificial knee joint, bilateral: Secondary | ICD-10-CM | POA: Diagnosis not present

## 2023-05-17 DIAGNOSIS — E876 Hypokalemia: Secondary | ICD-10-CM | POA: Diagnosis not present

## 2023-05-17 DIAGNOSIS — I10 Essential (primary) hypertension: Secondary | ICD-10-CM | POA: Diagnosis not present

## 2023-05-17 DIAGNOSIS — E785 Hyperlipidemia, unspecified: Secondary | ICD-10-CM | POA: Diagnosis not present

## 2023-05-17 DIAGNOSIS — Z9013 Acquired absence of bilateral breasts and nipples: Secondary | ICD-10-CM | POA: Diagnosis not present

## 2023-05-17 DIAGNOSIS — M353 Polymyalgia rheumatica: Secondary | ICD-10-CM | POA: Diagnosis not present

## 2023-05-17 DIAGNOSIS — R918 Other nonspecific abnormal finding of lung field: Secondary | ICD-10-CM | POA: Diagnosis not present

## 2023-05-17 DIAGNOSIS — J189 Pneumonia, unspecified organism: Secondary | ICD-10-CM | POA: Diagnosis not present

## 2023-05-17 DIAGNOSIS — Z7951 Long term (current) use of inhaled steroids: Secondary | ICD-10-CM | POA: Diagnosis not present

## 2023-05-17 DIAGNOSIS — Z882 Allergy status to sulfonamides status: Secondary | ICD-10-CM | POA: Diagnosis not present

## 2023-05-17 DIAGNOSIS — Z7982 Long term (current) use of aspirin: Secondary | ICD-10-CM | POA: Diagnosis not present

## 2023-05-17 DIAGNOSIS — J9601 Acute respiratory failure with hypoxia: Secondary | ICD-10-CM | POA: Diagnosis not present

## 2023-05-17 DIAGNOSIS — Z7902 Long term (current) use of antithrombotics/antiplatelets: Secondary | ICD-10-CM | POA: Diagnosis not present

## 2023-05-17 DIAGNOSIS — Z91048 Other nonmedicinal substance allergy status: Secondary | ICD-10-CM | POA: Diagnosis not present

## 2023-05-17 DIAGNOSIS — Z885 Allergy status to narcotic agent status: Secondary | ICD-10-CM | POA: Diagnosis not present

## 2023-05-17 DIAGNOSIS — R0902 Hypoxemia: Secondary | ICD-10-CM | POA: Diagnosis present

## 2023-05-17 DIAGNOSIS — D638 Anemia in other chronic diseases classified elsewhere: Secondary | ICD-10-CM | POA: Diagnosis not present

## 2023-05-17 DIAGNOSIS — I2510 Atherosclerotic heart disease of native coronary artery without angina pectoris: Secondary | ICD-10-CM | POA: Diagnosis not present

## 2023-05-17 DIAGNOSIS — I25119 Atherosclerotic heart disease of native coronary artery with unspecified angina pectoris: Secondary | ICD-10-CM | POA: Diagnosis not present

## 2023-05-17 DIAGNOSIS — G4733 Obstructive sleep apnea (adult) (pediatric): Secondary | ICD-10-CM | POA: Diagnosis not present

## 2023-05-17 DIAGNOSIS — Z91041 Radiographic dye allergy status: Secondary | ICD-10-CM | POA: Diagnosis not present

## 2023-05-17 DIAGNOSIS — Z853 Personal history of malignant neoplasm of breast: Secondary | ICD-10-CM | POA: Diagnosis not present

## 2023-05-17 DIAGNOSIS — Z96611 Presence of right artificial shoulder joint: Secondary | ICD-10-CM | POA: Diagnosis not present

## 2023-05-17 DIAGNOSIS — Z87891 Personal history of nicotine dependence: Secondary | ICD-10-CM | POA: Diagnosis not present

## 2023-05-17 DIAGNOSIS — Z1152 Encounter for screening for COVID-19: Secondary | ICD-10-CM | POA: Diagnosis not present

## 2023-05-17 DIAGNOSIS — J90 Pleural effusion, not elsewhere classified: Secondary | ICD-10-CM | POA: Diagnosis not present

## 2023-05-17 DIAGNOSIS — Z79899 Other long term (current) drug therapy: Secondary | ICD-10-CM | POA: Diagnosis not present

## 2023-05-17 DIAGNOSIS — Z7952 Long term (current) use of systemic steroids: Secondary | ICD-10-CM | POA: Diagnosis not present

## 2023-05-17 MED ORDER — ONDANSETRON HCL 4 MG/2ML IJ SOLN: 4.0000 mg | Freq: Four times a day (QID) | INTRAMUSCULAR | Status: DC | PRN

## 2023-05-17 MED ORDER — LORATADINE 10 MG PO TABS
10.0000 mg | ORAL_TABLET | Freq: Every day | ORAL | Status: DC
  Administered 2023-05-17 – 2023-05-20 (×4): 10 mg via ORAL
  Filled 2023-05-17 (×4): qty 1

## 2023-05-17 MED ORDER — IOHEXOL 350 MG/ML SOLN
100.0000 mL | Freq: Once | INTRAVENOUS | Status: AC | PRN
  Administered 2023-05-17: 75 mL via INTRAVENOUS

## 2023-05-17 MED ORDER — PREDNISONE 5 MG PO TABS
9.0000 mg | ORAL_TABLET | Freq: Every day | ORAL | Status: DC
  Administered 2023-05-18 – 2023-05-20 (×3): 9 mg via ORAL
  Filled 2023-05-17 (×3): qty 4

## 2023-05-17 MED ORDER — ASPIRIN 81 MG PO CHEW
81.0000 mg | CHEWABLE_TABLET | Freq: Every day | ORAL | Status: DC
  Administered 2023-05-17 – 2023-05-20 (×4): 81 mg via ORAL
  Filled 2023-05-17 (×4): qty 1

## 2023-05-17 MED ORDER — SODIUM CHLORIDE 0.9 % IV SOLN
500.0000 mg | Freq: Once | INTRAVENOUS | Status: AC
  Administered 2023-05-17: 500 mg via INTRAVENOUS
  Filled 2023-05-17: qty 5

## 2023-05-17 MED ORDER — PANTOPRAZOLE SODIUM 40 MG PO TBEC
40.0000 mg | DELAYED_RELEASE_TABLET | Freq: Every day | ORAL | Status: DC
  Administered 2023-05-17 – 2023-05-19 (×3): 40 mg via ORAL
  Filled 2023-05-17 (×3): qty 1

## 2023-05-17 MED ORDER — SERTRALINE HCL 50 MG PO TABS
50.0000 mg | ORAL_TABLET | Freq: Every day | ORAL | Status: DC
  Administered 2023-05-17 – 2023-05-20 (×4): 50 mg via ORAL
  Filled 2023-05-17 (×4): qty 1

## 2023-05-17 MED ORDER — ACETAMINOPHEN 325 MG PO TABS
650.0000 mg | ORAL_TABLET | Freq: Four times a day (QID) | ORAL | Status: DC
  Administered 2023-05-17 – 2023-05-20 (×10): 650 mg via ORAL
  Filled 2023-05-17 (×10): qty 2

## 2023-05-17 MED ORDER — ACETAMINOPHEN 650 MG RE SUPP: 650.0000 mg | Freq: Four times a day (QID) | RECTAL | Status: DC | PRN

## 2023-05-17 MED ORDER — ALBUTEROL SULFATE (2.5 MG/3ML) 0.083% IN NEBU: 2.5000 mg | INHALATION_SOLUTION | RESPIRATORY_TRACT | Status: DC | PRN

## 2023-05-17 MED ORDER — TRAZODONE HCL 50 MG PO TABS: 25.0000 mg | ORAL_TABLET | Freq: Every evening | ORAL | Status: DC | PRN

## 2023-05-17 MED ORDER — ONDANSETRON HCL 4 MG PO TABS: 4.0000 mg | ORAL_TABLET | Freq: Four times a day (QID) | ORAL | Status: DC | PRN

## 2023-05-17 MED ORDER — METOPROLOL SUCCINATE ER 50 MG PO TB24
25.0000 mg | ORAL_TABLET | Freq: Every day | ORAL | Status: DC
  Administered 2023-05-17 – 2023-05-20 (×4): 25 mg via ORAL
  Filled 2023-05-17 (×4): qty 1

## 2023-05-17 MED ORDER — DIPHENHYDRAMINE HCL 50 MG/ML IJ SOLN
50.0000 mg | Freq: Once | INTRAMUSCULAR | Status: AC
  Administered 2023-05-17: 50 mg via INTRAVENOUS
  Filled 2023-05-17: qty 1

## 2023-05-17 MED ORDER — SODIUM CHLORIDE 0.9 % IV SOLN: INTRAVENOUS | Status: DC

## 2023-05-17 MED ORDER — SODIUM CHLORIDE 0.9 % IV SOLN
1.0000 g | INTRAVENOUS | Status: DC
  Administered 2023-05-18 – 2023-05-20 (×3): 1 g via INTRAVENOUS
  Filled 2023-05-17 (×3): qty 10

## 2023-05-17 MED ORDER — HYDROMORPHONE HCL 2 MG PO TABS: 2.0000 mg | ORAL_TABLET | ORAL | Status: DC | PRN

## 2023-05-17 MED ORDER — SODIUM CHLORIDE 0.9 % IV SOLN
500.0000 mg | INTRAVENOUS | Status: DC
  Administered 2023-05-18 – 2023-05-20 (×3): 500 mg via INTRAVENOUS
  Filled 2023-05-17 (×3): qty 5

## 2023-05-17 MED ORDER — ROSUVASTATIN CALCIUM 20 MG PO TABS
20.0000 mg | ORAL_TABLET | Freq: Every day | ORAL | Status: DC
  Administered 2023-05-17 – 2023-05-20 (×4): 20 mg via ORAL
  Filled 2023-05-17 (×4): qty 1

## 2023-05-17 MED ORDER — SODIUM CHLORIDE 0.9 % IV SOLN
1.0000 g | Freq: Once | INTRAVENOUS | Status: AC
  Administered 2023-05-17: 1 g via INTRAVENOUS
  Filled 2023-05-17: qty 10

## 2023-05-17 MED ORDER — HYDROXYCHLOROQUINE SULFATE 200 MG PO TABS
300.0000 mg | ORAL_TABLET | Freq: Every day | ORAL | Status: DC
  Administered 2023-05-17 – 2023-05-20 (×4): 300 mg via ORAL
  Filled 2023-05-17 (×4): qty 1.5

## 2023-05-17 MED ORDER — ENOXAPARIN SODIUM 40 MG/0.4ML IJ SOSY
40.0000 mg | PREFILLED_SYRINGE | INTRAMUSCULAR | Status: DC
  Administered 2023-05-17 – 2023-05-20 (×4): 40 mg via SUBCUTANEOUS
  Filled 2023-05-17 (×4): qty 0.4

## 2023-05-17 MED ORDER — ACETAMINOPHEN 325 MG PO TABS: 650.0000 mg | ORAL_TABLET | Freq: Four times a day (QID) | ORAL | Status: DC | PRN

## 2023-05-17 MED ORDER — TRAMADOL HCL 50 MG PO TABS
100.0000 mg | ORAL_TABLET | Freq: Two times a day (BID) | ORAL | Status: DC | PRN
  Administered 2023-05-17 – 2023-05-19 (×3): 100 mg via ORAL
  Filled 2023-05-17 (×5): qty 2

## 2023-05-17 NOTE — ED Notes (Signed)
Attempted report at this time. Nurse unavailable at this time.

## 2023-05-17 NOTE — ED Notes (Signed)
To CT scan

## 2023-05-17 NOTE — ED Notes (Signed)
-  Called [facility] at 649am for transportation to [phone].

## 2023-05-17 NOTE — ED Provider Notes (Signed)
Patient presents with shortness of breath.  Noted to be hypoxic at home.  Recent surgery.  She is requiring 2 L of oxygen.  Patient signed out pending CT.  Required premedication for CT scanner.  Was called to patient bedside.  IV infiltrated and CT.  I replaced IV and patient went to CT.  CT reviewed and shows no evidence of central PE.  Does show concerns for right lower lobe pneumonia.  She remains on minimal oxygen.  Patient given IV Rocephin and azithromycin.  Will plan for admission to the hospitalist.  Patient also given an incentive spirometer.  BP (!) 173/86   Pulse 79   Temp 97.7 F (36.5 C) (Oral)   Resp 19   LMP 08/17/1977 (Approximate)   SpO2 100%   Physical Exam Weight, alert, no acute distress Nasal cannula in place Procedures  Ultrasound ED Peripheral IV (Provider)  Date/Time: 05/17/2023 6:15 AM  Performed by: Shon Baton, MD Authorized by: Shon Baton, MD   Procedure details:    Indications: multiple failed IV attempts     Skin Prep: chlorhexidine gluconate     Location:  Left AC   Angiocath:  20 G   Bedside Ultrasound Guided: Yes     Images: not archived     Patient tolerated procedure without complications: Yes     Dressing applied: Yes     ED Course / MDM   Clinical Course as of 05/17/23 0614  Wynelle Link May 16, 2023  1923 DG Chest [place] 1 View [MP]  2138 D-Dimer, Quant(!): 1.51 [MP]  2220 Laboratory workup notable for elevated D-dimer of 1.51.  No significant leukocytosis.  Peripheral ultrasound IV line placed in left upper arm.  Patient reports adverse reaction to IV contrast.  Will premedicate with 50 mg Benadryl and steroids and obtain CTA of the chest [MP]  Mon May 17, 2023  0031 I, Estelle June DO, am transitioning care of this patient to the oncoming provider pending CTA of the chest to look for PE, reevaluation and disposition [MP]    Clinical Course User Index [MP] Royanne Foots, DO   Medical Decision Making Amount and/or Complexity  of Data Reviewed Labs: ordered. Decision-making details documented in ED Course. Radiology: ordered. Decision-making details documented in ED Course.  Risk Prescription drug management. Decision regarding hospitalization.   Problem List Items Addressed This Visit       Other   Hypoxemia - Primary   Other Visit Diagnoses     Pneumonia of right lower lobe due to infectious organism       Relevant Medications   diphenhydrAMINE (BENADRYL) injection 50 mg (Completed)   diphenhydrAMINE (BENADRYL) injection 50 mg (Completed)   cefTRIAXone (ROCEPHIN) 1 g in sodium chloride 0.9 % 100 mL IVPB   azithromycin (ZITHROMAX) 500 mg in sodium chloride 0.9 % 250 mL IVPB             Shon Baton, MD 05/17/23 [phone]

## 2023-05-17 NOTE — Plan of Care (Signed)
Plan of Care Note for accepted transfer   Patient name: Tracey [name] AOZ:[id number] DOB: [birth year]  Facility requesting transfer: [facility] ED Requesting Provider: Dr. Wilkie Aye Facility course: 77 year old female with history of breast cancer in remission, CAD, hypertension, hyperlipidemia, OSA, NSVT, polymyalgia rheumatica, recent right shoulder surgery on 9/26 for rotator cuff tear arthropathy presented ED for evaluation of shortness of breath and low pulse ox readings at home.  Oxygen saturation in the low 90s on room air at rest in the ED and placed on 2 L [place].  Labs showing no leukocytosis, blood cultures collected, COVID/influenza/RSV PCR negative, D-dimer positive.  CTA chest showing: IMPRESSION: 1. Bolus timing is suboptimal for the pulmonary arteries. No central pulmonary embolus in the pulmonary outflow tract, main pulmonary arteries, or lobar pulmonary arteries. Distal/subsegmental pulmonary arteries to the upper and lower lobes are not reliably evaluated due to incomplete opacification. 2. Bibasilar collapse/consolidation, right greater than left. Likely atelectasis, pneumonia at the right base cannot be entirely excluded. 3. Trace right pleural effusion. 4. 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 5. Moderate hiatal hernia. 6. Hepatic steatosis. 7.  Aortic Atherosclerosis (ICD10-I70.0).  Patient was given antibiotics for CAP coverage.  Plan of care: The patient is accepted for admission to Telemetry unit at [facility].  [facility] will assume care on arrival to accepting facility. Until arrival, care as per EDP. However, [facility] available 24/7 for questions and assistance.  Check [email or web address] for on-call coverage.  Nursing staff, please call [facility] Admits & Consults System-Wide number under Amion on patient's arrival so appropriate admitting provider can evaluate the pt.

## 2023-05-17 NOTE — Progress Notes (Signed)
   05/17/23 2333  BiPAP/CPAP/SIPAP  $ Non-Invasive Home Ventilator  Initial  BiPAP/CPAP/SIPAP Pt Type Adult  BiPAP/CPAP/SIPAP DREAMSTATIOND  Mask Type Nasal pillows  Respiratory Rate 16 breaths/min  EPAP 10 cmH2O  Flow Rate 2 lpm  Patient Home Equipment No  Auto Titrate No

## 2023-05-17 NOTE — Progress Notes (Signed)
BLE venous duplex has been completed.   Results can be found under chart review under CV PROC. 05/17/2023 11:21 AM Kamira Mellette RVT, RDMS

## 2023-05-17 NOTE — H&P (Signed)
History and Physical  XANDRIA LECHTENBERG NUU:[id number] DOB: Nov 13, 1945 DOA: 05/16/2023  PCP: Alysia Penna, MD   Chief Complaint: Cough, shortness of breath  HPI: Tracey [name] is a 77 y.o. female with medical history significant for breast cancer, CAD on Plavix, GERD, hypertension, OSA, PMR on chronic prednisone being admitted to the hospital with community-acquired pneumonia after right shoulder replacement.  She underwent a right shoulder replacement 9/26 and since that time she has been having gradual onset of shortness of breath, low-grade fever, cough productive of green sputum.  She therefore came to the emergency department for evaluation.  She has a pulse oximeter at home, it was apparently reading in the high 70s.  She was saturating in the low 90s on evaluation in the emergency department, was placed on 2 L nasal cannula oxygen.  She has been afebrile, blood pressure little bit on the high side.  Lab work was done shows hemoglobin 12 from baseline of about 13, creatinine 1.07, CMP unremarkable.  WBC 8.8.  She had chest x-ray which showed right-sided atelectasis versus consolidation, as well as CTA of the chest which ruled out large central PE but was suboptimal contrast timing.  There is also evidence of possible right-sided consolidation on CT.  She was maintained on oxygen, given empiric IV azithromycin and IV Rocephin admitted to the hospitalist service at [facility].  Review of Systems: Please see HPI for pertinent positives and negatives. A complete 10 system review of systems are otherwise negative.  Past Medical History:  Diagnosis Date   Allergy    Anxiety    takes Ativan daily prn   Arthritis    Breast cancer (HCC) 2014   ER+/PR+/HEr2-,    Bruises easily    Colon polyps    2 polyps by report   Coronary artery disease involving native coronary artery of native heart without angina pectoris 01/01/2014   NSTEMI in January 2019 in [place]>> s/p DES x2 to the LAD  (procedure complicated by stent thrombosis) Myoview 05/2020: EF 72, no infarct or ischemia; low risk Echocardiogram 05/2019: EF 60-65, GLS -21.5 Cardiac catheterization at [facility] in 08/2017: Normal left main, normal LCx, normal RCA; LAD/diagonal 95%>> PCI    Diverticulosis    GERD (gastroesophageal reflux disease)    occasionally takes Nexium    Gout    Headache    MIgraines- no headaches- has floater in eyes   History of bladder infections    many yrs ago   History of bronchitis    last time at least 98yrs ago   History of hiatal hernia    History of stress incontinence    Hyperlipidemia    takes Pravastatin daily   Hypertension    Insomnia    takes Ambien nightly prn   NSTEMI (non-ST elevated myocardial infarction) (HCC) 08/2017   OSA (obstructive sleep apnea)    on CPAP   Osteoarthritis    Peripheral edema    takes Furosemide daily prn   PONV (postoperative nausea and vomiting)    Postmenopausal hormone therapy    Radiation 07/27/13-09/07/13   Right Breast x 31 treatments   Rhinitis    uses Flonase prn   Sinus congestion    Status post breast reconstruction 10/15/2014   Bilateral implant removal and DIEP performed in [place], [place]   Tachycardia    takes Metoprolol daily   Ventricular tachycardia, non-sustained (HCC)    during sleep study 2009 with normal cardiac workup   Past Surgical  History:  Procedure Laterality Date   ABDOMINAL HYSTERECTOMY     with BSO   APPENDECTOMY     ARTERY BIOPSY Left 05/15/2022   Procedure: BIOPSY TEMPORAL ARTERY;  Surgeon: Cephus Shelling, MD;  Location: [facility] OR;  Service: Vascular;  Laterality: Left;   AXILLARY SENTINEL NODE BIOPSY Right 05/23/2013   Procedure: AXILLARY SENTINEL NODE BIOPSY;  Surgeon: Adolph Pollack, MD;  Location: [facility] OR;  Service: General;  Laterality: Right;  nuc med injection 7:00   BREAST RECONSTRUCTION WITH PLACEMENT OF TISSUE EXPANDER AND FLEX HD (ACELLULAR HYDRATED DERMIS) Bilateral 05/23/2013    Procedure: BILATERAL BREAST RECONSTRUCTION WITH PLACEMENT OF TISSUE EXPANDER AND FLEX HD;  Surgeon: Etter Sjogren, MD;  Location: [facility] OR;  Service: Plastics;  Laterality: Bilateral;   CARDIAC CATHETERIZATION  1999   CATARACT EXTRACTION, BILATERAL Bilateral    CHOLECYSTECTOMY     COSMETIC SURGERY     OTHER SURGICAL HISTORY  09/2017   had cyst removal from spine    PERCUTANEOUS CORONARY STENT INTERVENTION (PCI-S)  08/2017   done in [place]     REMOVAL OF BILATERAL TISSUE EXPANDERS WITH PLACEMENT OF BILATERAL BREAST IMPLANTS Bilateral 05/17/2014   Procedure: REMOVAL OF BILATERAL TISSUE EXPANDERS WITH PLACEMENT OF BILATERAL BREAST IMPLANTS FOR RECONSTRUCTION;  Surgeon: Etter Sjogren, MD;  Location: [facility] OR;  Service: Plastics;  Laterality: Bilateral;   REVERSE SHOULDER ARTHROPLASTY Right 05/13/2023   Procedure: REVERSE SHOULDER ARTHROPLASTY;  Surgeon: Francena Hanly, MD;  Location: [facility] ORS;  Service: Orthopedics;  Laterality: Right;   TONSILLECTOMY     TOTAL KNEE ARTHROPLASTY Right 07/26/2018   Procedure: TOTAL KNEE ARTHROPLASTY;  Surgeon: Durene Romans, MD;  Location: [facility] ORS;  Service: Orthopedics;  Laterality: Right;  70 mins   TOTAL KNEE ARTHROPLASTY Left 12/02/2021   Procedure: TOTAL KNEE ARTHROPLASTY;  Surgeon: Durene Romans, MD;  Location: [facility] ORS;  Service: Orthopedics;  Laterality: Left;   TOTAL MASTECTOMY Bilateral 05/23/2013   Procedure: TOTAL MASTECTOMY;  Surgeon: Adolph Pollack, MD;  Location[facility] OR;  Service: General;  Laterality: Bilateral;    Social History:  reports that she has quit smoking. Her smoking use included cigarettes. She started smoking about 30 years ago. She has a 30.9 pack-year smoking history. She has never used smokeless tobacco. She reports that she does not currently use alcohol after a past usage of about 1.0 standard drink of alcohol per week. She reports that she does not currently use drugs after having used the following drugs: Other-see comments.  Allergies   Allergen Reactions   Pseudoeph-Hydrocodone-Gg Other (See Comments)    hallucinations   Ivp Dye [Iodinated Contrast Media] Hives   Oxycodone Other (See Comments)    hallucinations   Sulfa Antibiotics Swelling   Tape Other (See Comments)    Unknown; tears skin ; ok to use paper tape     Family History  Problem Relation Age of Onset   Breast cancer Mother        possible inflammatory breast cancer   Heart attack Father    Heart disease Father    ALS Sister    Breast cancer Maternal Grandmother 13   Heart attack Maternal Grandfather    Heart attack Paternal Grandfather    Breast cancer Maternal Aunt        dx in her 58s   Breast cancer Other        maternal great grandmother; dx in her 33s     Prior to Admission medications   Medication Sig Start Date  End Date Taking? Authorizing Provider  allopurinol (ZYLOPRIM) 100 MG tablet TAKE 1 TABLET BY MOUTH ONCE DAILY 06/25/16   Magrinat, Valentino Hue, MD  ASPIRIN LOW DOSE 81 MG chewable tablet Chew 81 mg by mouth daily.  09/19/17   [provider]  Budeson-Glycopyrrol-Formoterol (BREZTRI AEROSPHERE) 160-9-4.8 MCG/ACT AERO Inhale 2 puffs into the lungs in the morning and at bedtime. 06/19/22   Nyoka Cowden, MD  budesonide-formoterol (SYMBICORT) 160-4.5 MCG/ACT inhaler Take 2 puffs first thing in am and then another 2 puffs about 12 hours later. 06/19/22   Nyoka Cowden, MD  Cholecalciferol (VITAMIN D3) 50 MCG (2000 UT) TABS Take 2,000 Units by mouth daily.    [provider]  clopidogrel (PLAVIX) 75 MG tablet TAKE 1 TABLET(75 MG) BY MOUTH DAILY 03/17/23   Tonny Bollman, MD  cyanocobalamin (,VITAMIN B-12,) 1000 MCG/ML injection Inject 1,000 mcg into the muscle every 30 (thirty) days.    [provider]  ezetimibe (ZETIA) 10 MG tablet TAKE 1 TABLET(10 MG) BY MOUTH DAILY 03/17/23   Tonny Bollman, MD  fluticasone [facility]) 50 MCG/ACT nasal spray Place 1 spray into both nostrils daily. 01/18/13   [provider]   folic acid (FOLVITE) 1 MG tablet Take 1 mg by mouth daily.    [provider]  furosemide (LASIX) 40 MG tablet Take 40 mg by mouth daily as needed for fluid.  04/01/13   [provider]  HYDROmorphone (DILAUDID) 2 MG tablet Take 1 tablet (2 mg total) by mouth every 4 (four) hours as needed. 05/13/23   Shuford, French Ana, PA-C  hydroxychloroquine (PLAQUENIL) 200 MG tablet Take 300 mg by mouth daily.    [provider]  loratadine (CLARITIN) 10 MG tablet Take 10 mg by mouth daily.    [provider]  methocarbamol (ROBAXIN) 500 MG tablet Take 1 tablet (500 mg total) by mouth every 8 (eight) hours as needed for muscle spasms. Patient not taking: Reported on 04/28/2023 12/31/21   Regalado, Jon Billings A, MD  methocarbamol (ROBAXIN) 500 MG tablet Take 1 tablet (500 mg total) by mouth every 8 (eight) hours as needed for muscle spasms. 05/13/23   Shuford, French Ana, PA-C  metoprolol succinate (TOPROL-XL) 25 MG 24 hr tablet TAKE 1 TABLET(25 MG) BY MOUTH DAILY 06/16/22   Tonny Bollman, MD  Multiple Vitamin (MULTI-VITAMIN) tablet Take 1 tablet by mouth daily.    [provider]  Multiple Vitamins-Minerals (HAIR/SKIN/NAILS) CAPS Take 1 tablet by mouth daily.    [provider]  nitroGLYCERIN (NITROSTAT) 0.4 MG SL tablet Place 1 tablet (0.4 mg total) under the tongue every 5 (five) minutes as needed for chest pain. 06/02/21   Alver Sorrow, NP  NONFORMULARY OR COMPOUNDED ITEM Vitamin E vaginal cream 200u/ml.  One ml pv three times weekly. Patient not taking: Reported on 04/28/2023 09/11/19   Jerene Bears, MD  pantoprazole (PROTONIX) 40 MG tablet TAKE 1 TABLET(40 MG) BY MOUTH DAILY 05/12/23   Tonny Bollman, MD  polyethylene glycol (MIRALAX / GLYCOLAX) 17 g packet Take 17 g by mouth daily as needed for mild constipation. 12/03/21   Cassandria Anger, PA-C  predniSONE (DELTASONE) 1 MG tablet Take 4 mg by mouth daily. 05/21/22   [provider]  predniSONE  (DELTASONE) 5 MG tablet Take 5 mg by mouth daily with breakfast.    [provider]  Propylene Glycol (SYSTANE COMPLETE) 0.6 % SOLN Place 1 drop into both eyes 2 (two) times daily.    [provider]  rosuvastatin (CRESTOR) 20 MG tablet Take 1 tablet (20 mg total) by mouth daily. 12/07/22   Tonny Bollman, MD  sertraline (ZOLOFT) 100 MG tablet Take 50 mg by mouth daily. 12/06/20   [provider]  testosterone cypionate (DEPOTESTOSTERONE CYPIONATE) 200 MG/ML injection Inject 200 mg into the muscle every 30 (thirty) days. 01/17/19   [provider]    Physical Exam: BP (!) 150/84 (BP Location: Left Arm)   Pulse 80   Temp 98.4 F (36.9 C) (Oral)   Resp 16   LMP 08/17/1977 (Approximate)   SpO2 100%   General:  Alert, oriented, calm, in no acute distress, her daughter is at the bedside.  Patient is resting comfortably on 2 L nasal cannula oxygen. Eyes: EOMI, clear conjuctivae, white sclerea Cardiovascular: RRR, no murmurs or rubs, no peripheral edema  Respiratory: clear to auscultation bilaterally on anterior exam, no wheezes, no crackles  Abdomen: soft, nontender, nondistended, normal bowel tones heard  Skin: dry, no rashes  Musculoskeletal: no joint effusions, normal range of motion, right shoulder in sling and not manipulated.  She does have swelling, bruising and tenderness in the left AC at the site of an IV. Psychiatric: appropriate affect, normal speech  Neurologic: extraocular muscles intact, clear speech, moving all extremities with intact sensorium         Labs on Admission:  Basic Metabolic Panel: Recent Labs  Lab 05/16/23 2108  NA 139  K 4.3  CL 103  CO2 27  GLUCOSE 102*  BUN 19  CREATININE 1.07*  CALCIUM 9.1   Liver Function Tests: Recent Labs  Lab 05/16/23 2108  AST 25  ALT 19  ALKPHOS 51  BILITOT 0.5  PROT 6.3*  ALBUMIN 3.8   No results for input(s): "LIPASE", "AMYLASE" in the last 168 hours. No results for input(s):  "AMMONIA" in the last 168 hours. CBC: Recent Labs  Lab 05/16/23 2108  WBC 8.8  NEUTROABS 6.8  HGB 11.3*  HCT 34.3*  MCV 90.7  PLT 201   Cardiac Enzymes: No results for input(s): "CKTOTAL", "CKMB", "CKMBINDEX", "TROPONINI" in the last 168 hours.  BNP (last 3 results) No results for input(s): "BNP" in the last 8760 hours.  ProBNP (last 3 results) No results for input(s): "PROBNP" in the last 8760 hours.  CBG: No results for input(s): "GLUCAP" in the last 168 hours.  Radiological Exams on Admission: CT Angio Chest PE W and/or Wo Contrast  Result Date: 05/17/2023 CLINICAL DATA:  Recent postop shoulder replacement 4 days ago. Low O2 sats. Shortness of breath and congestion. EXAM: CT ANGIOGRAPHY CHEST WITH CONTRAST TECHNIQUE: Multidetector CT imaging of the chest was performed using the standard protocol during bolus administration of intravenous contrast. Multiplanar CT image reconstructions and MIPs were obtained to evaluate the vascular anatomy. RADIATION DOSE REDUCTION: This exam was performed according to the departmental dose-optimization program which includes automated exposure control, adjustment of the mA and/or kV according to patient size and/or use of iterative reconstruction technique. CONTRAST:  75mL OMNIPAQUE IOHEXOL 350 MG/ML SOLN COMPARISON:  None Available. FINDINGS: Patient had a contrast extravasation event in the left upper extremity during initial contrast injection. See clinical record for full details. Cardiovascular: The heart size is normal. No substantial pericardial effusion. Coronary artery calcification is evident. Mild atherosclerotic calcification is noted in the wall of the thoracic aorta. Bolus timing is suboptimal for the pulmonary arteries. No central pulmonary embolus in the pulmonary outflow tract, main pulmonary arteries, or lobar pulmonary arteries. Distal/subsegmental pulmonary arteries to the  upper and lower lobes are unreliable evaluated due to  incomplete opacification. Mediastinum/Nodes: No mediastinal lymphadenopathy. There is no hilar lymphadenopathy. The esophagus has normal imaging features. Moderate hiatal hernia. There is no axillary lymphadenopathy. Extravasated contrast material is seen in the left axilla and upper extremity. Lungs/Pleura: Bibasilar collapse/consolidation noted, right greater than left. Architectural distortion with subpleural reticulation anterior right upper lobe suggests radiation fibrosis no pulmonary edema. Trace right pleural effusion evident. Upper Abdomen: The liver shows diffusely decreased attenuation suggesting fat deposition. Musculoskeletal: No worrisome lytic or sclerotic osseous abnormality. Status post right shoulder reverse arthroplasty. Gas in the soft tissues around the right shoulder is not unexpected in the first 7-10 days after surgery. Review of the MIP images confirms the above findings. IMPRESSION: 1. Bolus timing is suboptimal for the pulmonary arteries. No central pulmonary embolus in the pulmonary outflow tract, main pulmonary arteries, or lobar pulmonary arteries. Distal/subsegmental pulmonary arteries to the upper and lower lobes are not reliably evaluated due to incomplete opacification. 2. Bibasilar collapse/consolidation, right greater than left. Likely atelectasis, pneumonia at the right base cannot be entirely excluded. 3. Trace right pleural effusion. 4. 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 5. Moderate hiatal hernia. 6. Hepatic steatosis. 7.  Aortic Atherosclerosis (ICD10-I70.0). Electronically Signed   By: Kennith Center M.D.   On: 05/17/2023 06:06   DG Chest Port 1 View  Result Date: 05/16/2023 CLINICAL DATA:  Shortness of breath. EXAM: PORTABLE CHEST 1 VIEW COMPARISON:  Chest radiograph dated 06/19/2022. FINDINGS: Right lung base opacity, likely combination of pleural effusion and associated atelectasis or infiltrate.  The left lung is clear. No pneumothorax. Stable cardiac silhouette. Coronary vascular stent. Right shoulder arthroplasty. No acute osseous pathology. IMPRESSION: Right pleural effusion and right lung base atelectasis or infiltrate, new since the prior radiograph. Electronically Signed   By: Elgie Collard M.D.   On: 05/16/2023 18:46    Assessment/Plan PAMLER WELLE is a 77 y.o. female with medical history significant for breast cancer in remission, CAD on Plavix, GERD, hypertension, OSA, PMR on chronic prednisone being admitted to the hospital with acute hypoxic respiratory failure likely due to community-acquired pneumonia after right shoulder replacement.   Community-acquired pneumonia-suspected due to cough, sputum production, imaging abnormality as noted above.  Patient is not septic, stable on 2 L nasal cannula oxygen. -Inpatient admission -Continue supplemental oxygen, wean as tolerated -Continue empiric IV azithromycin and IV Rocephin  Acute hypoxic respiratory failure-most likely due to community-acquired pneumonia suspected above, much less likely due to PE, patient has elevated D-dimer but likely this is a result of her recent surgery.  CTA as above has ruled out large central PE however contrast timing was not optimal.  After discussion with the patient and her daughter, they would feel more comfortable getting lower extremity Dopplers to rule out DVT. -Treat community-acquired pneumonia as above -Check bilateral lower extremity Dopplers to rule out DVT  Polymyalgia rheumatica-continue home dose prednisone, she received stress dose to 50 mg p.o. prednisone as well as IV steroids prior to CT scan, due to contrast allergy.  Status post right shoulder arthroplasty -Follow-up with orthopedics Dr. Rennis Chris as previously scheduled -Scheduled Tylenol and as needed tramadol for pain (patient does not tolerate narcotics)  CAD-continue home aspirin and Plavix  DVT prophylaxis: Lovenox      Code Status: Full Code  Consults called: None  Admission status: The appropriate patient status for this patient is INPATIENT. Inpatient status is judged to be reasonable and  necessary in order to provide the required intensity of service to ensure the patient's safety. The patient's presenting symptoms, physical exam findings, and initial radiographic and laboratory data in the context of their chronic comorbidities is felt to place them at high risk for further clinical deterioration. Furthermore, it is not anticipated that the patient will be medically stable for discharge from the hospital within 2 midnights of admission.    I certify that at the point of admission it is my clinical judgment that the patient will require inpatient hospital care spanning beyond 2 midnights from the point of admission due to high intensity of service, high risk for further deterioration and high frequency of surveillance required   Time spent: 58 minutes  Roisin Mones Sharlette Dense MD [facility] Pager [phone]  If 7PM-7AM, please contact night-coverage [email or web address] Password [email or web address]  05/17/2023, 9:33 AM

## 2023-05-18 ENCOUNTER — Observation Stay (HOSPITAL_COMMUNITY): Payer: Medicare Other

## 2023-05-18 DIAGNOSIS — Z7982 Long term (current) use of aspirin: Secondary | ICD-10-CM | POA: Diagnosis not present

## 2023-05-18 DIAGNOSIS — Z7951 Long term (current) use of inhaled steroids: Secondary | ICD-10-CM | POA: Diagnosis not present

## 2023-05-18 DIAGNOSIS — I700 Atherosclerosis of aorta: Secondary | ICD-10-CM | POA: Diagnosis not present

## 2023-05-18 DIAGNOSIS — Z7902 Long term (current) use of antithrombotics/antiplatelets: Secondary | ICD-10-CM | POA: Diagnosis not present

## 2023-05-18 DIAGNOSIS — Z87891 Personal history of nicotine dependence: Secondary | ICD-10-CM | POA: Diagnosis not present

## 2023-05-18 DIAGNOSIS — D649 Anemia, unspecified: Secondary | ICD-10-CM

## 2023-05-18 DIAGNOSIS — G4733 Obstructive sleep apnea (adult) (pediatric): Secondary | ICD-10-CM | POA: Diagnosis present

## 2023-05-18 DIAGNOSIS — Z79899 Other long term (current) drug therapy: Secondary | ICD-10-CM | POA: Diagnosis not present

## 2023-05-18 DIAGNOSIS — E876 Hypokalemia: Secondary | ICD-10-CM | POA: Diagnosis present

## 2023-05-18 DIAGNOSIS — I10 Essential (primary) hypertension: Secondary | ICD-10-CM | POA: Diagnosis present

## 2023-05-18 DIAGNOSIS — E785 Hyperlipidemia, unspecified: Secondary | ICD-10-CM

## 2023-05-18 DIAGNOSIS — Z882 Allergy status to sulfonamides status: Secondary | ICD-10-CM | POA: Diagnosis not present

## 2023-05-18 DIAGNOSIS — Z1152 Encounter for screening for COVID-19: Secondary | ICD-10-CM | POA: Diagnosis not present

## 2023-05-18 DIAGNOSIS — I25119 Atherosclerotic heart disease of native coronary artery with unspecified angina pectoris: Secondary | ICD-10-CM | POA: Diagnosis present

## 2023-05-18 DIAGNOSIS — J9811 Atelectasis: Secondary | ICD-10-CM | POA: Diagnosis not present

## 2023-05-18 DIAGNOSIS — Z9013 Acquired absence of bilateral breasts and nipples: Secondary | ICD-10-CM | POA: Diagnosis not present

## 2023-05-18 DIAGNOSIS — Z91048 Other nonmedicinal substance allergy status: Secondary | ICD-10-CM | POA: Diagnosis not present

## 2023-05-18 DIAGNOSIS — J90 Pleural effusion, not elsewhere classified: Secondary | ICD-10-CM | POA: Diagnosis not present

## 2023-05-18 DIAGNOSIS — Z91041 Radiographic dye allergy status: Secondary | ICD-10-CM | POA: Diagnosis not present

## 2023-05-18 DIAGNOSIS — J189 Pneumonia, unspecified organism: Secondary | ICD-10-CM | POA: Diagnosis present

## 2023-05-18 DIAGNOSIS — R0603 Acute respiratory distress: Secondary | ICD-10-CM | POA: Diagnosis not present

## 2023-05-18 DIAGNOSIS — Z96653 Presence of artificial knee joint, bilateral: Secondary | ICD-10-CM | POA: Diagnosis present

## 2023-05-18 DIAGNOSIS — Z471 Aftercare following joint replacement surgery: Secondary | ICD-10-CM | POA: Diagnosis not present

## 2023-05-18 DIAGNOSIS — R9389 Abnormal findings on diagnostic imaging of other specified body structures: Secondary | ICD-10-CM | POA: Diagnosis not present

## 2023-05-18 DIAGNOSIS — Z7952 Long term (current) use of systemic steroids: Secondary | ICD-10-CM | POA: Diagnosis not present

## 2023-05-18 DIAGNOSIS — M7989 Other specified soft tissue disorders: Secondary | ICD-10-CM | POA: Diagnosis not present

## 2023-05-18 DIAGNOSIS — D638 Anemia in other chronic diseases classified elsewhere: Secondary | ICD-10-CM | POA: Diagnosis present

## 2023-05-18 DIAGNOSIS — R0902 Hypoxemia: Secondary | ICD-10-CM | POA: Diagnosis present

## 2023-05-18 DIAGNOSIS — M353 Polymyalgia rheumatica: Secondary | ICD-10-CM | POA: Diagnosis present

## 2023-05-18 DIAGNOSIS — J9601 Acute respiratory failure with hypoxia: Secondary | ICD-10-CM | POA: Diagnosis present

## 2023-05-18 DIAGNOSIS — Z885 Allergy status to narcotic agent status: Secondary | ICD-10-CM | POA: Diagnosis not present

## 2023-05-18 DIAGNOSIS — Z853 Personal history of malignant neoplasm of breast: Secondary | ICD-10-CM | POA: Diagnosis not present

## 2023-05-18 DIAGNOSIS — Z96611 Presence of right artificial shoulder joint: Secondary | ICD-10-CM | POA: Diagnosis present

## 2023-05-18 LAB — IRON AND TIBC
Iron: 27 ug/dL — ABNORMAL LOW (ref 28–170)
Saturation Ratios: 9 % — ABNORMAL LOW (ref 10.4–31.8)
TIBC: 304 ug/dL (ref 250–450)
UIBC: 277 ug/dL

## 2023-05-18 LAB — BASIC METABOLIC PANEL
Anion gap: 9 (ref 5–15)
BUN: 33 mg/dL — ABNORMAL HIGH (ref 8–23)
CO2: 24 mmol/L (ref 22–32)
Calcium: 8.4 mg/dL — ABNORMAL LOW (ref 8.9–10.3)
Chloride: 104 mmol/L (ref 98–111)
Creatinine, Ser: 1.4 mg/dL — ABNORMAL HIGH (ref 0.44–1.00)
GFR, Estimated: 39 mL/min — ABNORMAL LOW (ref >60)
Glucose, Bld: 103 mg/dL — ABNORMAL HIGH (ref 70–99)
Potassium: 3.4 mmol/L — ABNORMAL LOW (ref 3.5–5.1)
Sodium: 137 mmol/L (ref 135–145)

## 2023-05-18 LAB — CBC
HCT: 32.3 % — ABNORMAL LOW (ref 36.0–46.0)
Hemoglobin: 10 g/dL — ABNORMAL LOW (ref 12.0–15.0)
MCH: 29.3 pg (ref 26.0–34.0)
MCHC: 31 g/dL (ref 30.0–36.0)
MCV: 94.7 fL (ref 80.0–100.0)
Platelets: 192 10*3/uL (ref 150–400)
RBC: 3.41 MIL/uL — ABNORMAL LOW (ref 3.87–5.11)
RDW: 13.6 % (ref 11.5–15.5)
WBC: 8.8 10*3/uL (ref 4.0–10.5)
nRBC: 0 % (ref 0.0–0.2)

## 2023-05-18 LAB — VITAMIN B12: Vitamin B-12: 392 pg/mL (ref 180–914)

## 2023-05-18 LAB — FOLATE: Folate: 18.3 ng/mL (ref >5.9)

## 2023-05-18 LAB — MAGNESIUM: Magnesium: 2.1 mg/dL (ref 1.7–2.4)

## 2023-05-18 LAB — STREP PNEUMONIAE URINARY ANTIGEN: Strep Pneumo Urinary Antigen: NEGATIVE

## 2023-05-18 LAB — FERRITIN: Ferritin: 72 ng/mL (ref 11–307)

## 2023-05-18 MED ORDER — TRAMADOL HCL 50 MG PO TABS
100.0000 mg | ORAL_TABLET | Freq: Two times a day (BID) | ORAL | Status: DC
  Administered 2023-05-18 – 2023-05-20 (×5): 100 mg via ORAL
  Filled 2023-05-18 (×4): qty 2

## 2023-05-18 MED ORDER — EZETIMIBE 10 MG PO TABS
10.0000 mg | ORAL_TABLET | Freq: Every day | ORAL | Status: DC
  Administered 2023-05-18 – 2023-05-20 (×3): 10 mg via ORAL
  Filled 2023-05-18 (×3): qty 1

## 2023-05-18 MED ORDER — FOLIC ACID 1 MG PO TABS
1.0000 mg | ORAL_TABLET | Freq: Every day | ORAL | Status: DC
  Administered 2023-05-18 – 2023-05-20 (×3): 1 mg via ORAL
  Filled 2023-05-18 (×3): qty 1

## 2023-05-18 MED ORDER — POLYETHYLENE GLYCOL 3350 17 G PO PACK
17.0000 g | PACK | Freq: Every day | ORAL | Status: DC | PRN
  Administered 2023-05-18: 17 g via ORAL
  Filled 2023-05-18: qty 1

## 2023-05-18 MED ORDER — MOMETASONE FURO-FORMOTEROL FUM 200-5 MCG/ACT IN AERO
2.0000 | INHALATION_SPRAY | Freq: Two times a day (BID) | RESPIRATORY_TRACT | Status: DC
  Administered 2023-05-18 – 2023-05-20 (×5): 2 via RESPIRATORY_TRACT
  Filled 2023-05-18: qty 8.8

## 2023-05-18 MED ORDER — POTASSIUM CHLORIDE CRYS ER 20 MEQ PO TBCR
40.0000 meq | EXTENDED_RELEASE_TABLET | Freq: Once | ORAL | Status: AC
  Administered 2023-05-18: 40 meq via ORAL
  Filled 2023-05-18: qty 2

## 2023-05-18 MED ORDER — FLUTICASONE PROPIONATE 50 MCG/ACT NA SUSP
2.0000 | Freq: Every day | NASAL | Status: DC
  Administered 2023-05-18 – 2023-05-20 (×3): 2 via NASAL
  Filled 2023-05-18: qty 16

## 2023-05-18 MED ORDER — OLOPATADINE HCL 0.1 % OP SOLN
1.0000 [drp] | Freq: Two times a day (BID) | OPHTHALMIC | Status: DC
  Administered 2023-05-18 – 2023-05-20 (×5): 1 [drp] via OPHTHALMIC
  Filled 2023-05-18 (×2): qty 5

## 2023-05-18 MED ORDER — BENZONATATE 100 MG PO CAPS: 200.0000 mg | ORAL_CAPSULE | Freq: Three times a day (TID) | ORAL | Status: DC | PRN

## 2023-05-18 MED ORDER — GUAIFENESIN ER 600 MG PO TB12
1200.0000 mg | ORAL_TABLET | Freq: Two times a day (BID) | ORAL | Status: DC
  Administered 2023-05-18 – 2023-05-20 (×4): 1200 mg via ORAL
  Filled 2023-05-18 (×5): qty 2

## 2023-05-18 MED ORDER — VITAMIN D3 25 MCG (1000 UNIT) PO TABS
2000.0000 [IU] | ORAL_TABLET | Freq: Every day | ORAL | Status: DC
  Administered 2023-05-18 – 2023-05-20 (×3): 2000 [IU] via ORAL
  Filled 2023-05-18 (×3): qty 2

## 2023-05-18 MED ORDER — PROPYLENE GLYCOL 0.6 % OP SOLN: 2.0000 [drp] | Freq: Two times a day (BID) | OPHTHALMIC | Status: DC

## 2023-05-18 MED ORDER — ALLOPURINOL 100 MG PO TABS
100.0000 mg | ORAL_TABLET | Freq: Every day | ORAL | Status: DC
  Administered 2023-05-18 – 2023-05-20 (×3): 100 mg via ORAL
  Filled 2023-05-18 (×3): qty 1

## 2023-05-18 NOTE — Progress Notes (Signed)
PENDING  Incomplete  Culture, blood (routine x 2)     Status: None (Preliminary result)   Collection Time: 05/16/23 10:20 PM   Specimen: BLOOD  Result Value Ref Range Status   Specimen Description   Final    BLOOD BLOOD LEFT ARM UPPER Performed at [facility], [address], [place], [place] [address]    Special Requests   Final    BOTTLES DRAWN AEROBIC AND ANAEROBIC Blood Culture adequate volume Performed at [facility], [address] [place], [place] [address]    Culture   Final    NO GROWTH < 24 HOURS Performed at [facility], [address] [place], [place] [address]    Report Status PENDING  Incomplete         Radiology Studies: DG CHEST PORT 1 VIEW  Result Date: 05/18/2023 CLINICAL DATA:  Pleural effusion. EXAM: PORTABLE CHEST 1 VIEW COMPARISON:  May 16, 2023. FINDINGS: Stable cardiomediastinal silhouette. Status post right shoulder arthroplasty. Left lung is clear. Elevated right hemidiaphragm is noted with minimal right basilar subsegmental atelectasis. No effusion is noted. IMPRESSION: Elevated right  hemidiaphragm with minimal right basilar subsegmental atelectasis. Electronically Signed   By: Lupita Raider M.D.   On: 05/18/2023 16:36   VAS [place] LOWER EXTREMITY VENOUS (DVT)  Result Date: 05/17/2023  Lower Venous DVT Study Patient Name:  Tracey [name]  Date of Exam:   05/17/2023 Medical Rec #: [id number]       Accession #:    [id number] Date of Birth: [birth year]       Patient Gender: F Patient Age:   13 years Exam Location:  [facility] Procedure:      VAS [place] LOWER EXTREMITY VENOUS (DVT) Referring Phys: [facility] --------------------------------------------------------------------------------  Indications: Acute hypoxic respiratory failure.  Comparison Study: Previous RLEV on 12/12/2021 & LLEV on 08/17/2018 were negative                   for DVT. Performing Technologist: Ernestene Mention RVT, RDMS  Examination Guidelines: A complete evaluation includes B-mode imaging, spectral Doppler, color Doppler, and power Doppler as needed of all accessible portions of each vessel. Bilateral testing is considered an integral part of a complete examination. Limited examinations for reoccurring indications may be performed as noted. The reflux portion of the exam is performed with the patient in reverse Trendelenburg.  +---------+---------------+---------+-----------+----------+--------------+ RIGHT    CompressibilityPhasicitySpontaneityPropertiesThrombus Aging +---------+---------------+---------+-----------+----------+--------------+ CFV      Full           Yes      Yes                                 +---------+---------------+---------+-----------+----------+--------------+ SFJ      Full                                                        +---------+---------------+---------+-----------+----------+--------------+ FV Prox  Full           Yes      Yes                                 +---------+---------------+---------+-----------+----------+--------------+ FV Mid   Full  PROGRESS NOTE    Tracey [name]  WUJ:[id number] DOB: 1945-10-11 DOA: 05/16/2023 PCP: Alysia Penna, MD    Chief Complaint  Patient presents with   Shortness of Breath    Brief Narrative:  Patient pleasant 77 year old female history of breast cancer, CAD on Plavix, GERD, hypertension, OSA, PMR on chronic prednisone presented to the ED with community-acquired pneumonia after right shoulder replacement.  Patient notes that since shoulder replacement from 9/26 had been having gradual worsening onset of shortness of breath, low-grade fever at, productive cough of greenish sputum.  Patient checked his sats at home noted to be in the 77s and presented to the ED.  Patient seen in the ED chest x-ray noted a right-sided atelectasis versus consolidation, CT angiogram chest done was negative for large central PE but with suboptimal contrast timing and concern for possible right-sided consolidation.  Patient admitted, placed empirically on IV Rocephin and azithromycin.   Assessment & Plan:   Principal Problem:   Acute hypoxemic respiratory failure (HCC) Active Problems:   CAP (community acquired pneumonia)   Coronary artery disease involving native coronary artery with angina pectoris (HCC)   Essential hypertension   OSA on CPAP   Hyperlipidemia LDL goal <70   Hypokalemia   Anemia  #1 acute hypoxemic respiratory failure secondary to community-acquired pneumonia -Patient presented with hypoxia noted to have sats of 70% per her pulse ox at home prior to admission. -Patient noted to have presented with a productive cough of greenish sputum, gradual worsening shortness of breath, low-grade fevers. -Chest x-ray done with right-sided atelectasis versus consultation. -CT angiogram chest done was negative for large central PE however contrast timing was not optimal. -Lower extremity Dopplers done negative for DVT. -Check a sputum Gram stain and culture. -SARS coronavirus 2 PCR negative, influenza A  and B PCR negative, RSV PCR negative. -Check a sputum Gram stain and culture. -Repeat chest x-ray. -Blood cultures pending. -Continue empiric IV Rocephin, IV azithromycin. -Place on Flonase, Mucinex, Claritin, Dulera, nebs as needed.  2.  Status post right shoulder arthroplasty -Continue scheduled Tylenol, as needed tramadol. -It is noted that patient does not tolerate narcotics. -PT/OT -Outpatient follow-up with orthopedics.  Called  3.  Polymyalgia rheumatica -Patient noted to have received stress dose steroids on presentation to the ED, as well as IV steroids prior to CT scan due to contrast allergy. -Continue home regimen steroids.  4.  Hypertension -Continue home regimen Toprol-XL.  5.  OSA -CPAP nightly.  6.  Hypokalemia -Replete.  7.  Anemia -Patient with no overt bleeding. -Likely secondary to a postop anemia. -Anemia panel with iron level of 27, TIBC of 304, ferritin of 72, folate of 18.3, vitamin B12 of 392. -Follow H&H. -Transfusion threshold hemoglobin < 8.    DVT prophylaxis: Lovenox Code Status: Full Family Communication: Updated patient, husband, daughter at bedside. Disposition: Likely home when clinically improved.  Status is: Inpatient The patient will require care spanning > 2 midnights and should be moved to inpatient because: Severity of illness   Consultants:  None  Procedures:  CT angiogram chest 05/17/2023 Chest x-ray 05/16/2023, 05/18/2023 Lower extremity Dopplers 05/17/2023   Antimicrobials:  Anti-infectives (From admission, onward)    Start     Dose/Rate Route Frequency Ordered Stop   05/18/23 0800  azithromycin (ZITHROMAX) 500 mg in sodium chloride 0.9 % 250 mL IVPB        500 mg 250 mL/hr over 60 Minutes Intravenous Every 24 hours 05/17/23 0933     05/18/23 0700  PROGRESS NOTE    Tracey [name]  WUJ:[id number] DOB: 1945-10-11 DOA: 05/16/2023 PCP: Alysia Penna, MD    Chief Complaint  Patient presents with   Shortness of Breath    Brief Narrative:  Patient pleasant 77 year old female history of breast cancer, CAD on Plavix, GERD, hypertension, OSA, PMR on chronic prednisone presented to the ED with community-acquired pneumonia after right shoulder replacement.  Patient notes that since shoulder replacement from 9/26 had been having gradual worsening onset of shortness of breath, low-grade fever at, productive cough of greenish sputum.  Patient checked his sats at home noted to be in the 77s and presented to the ED.  Patient seen in the ED chest x-ray noted a right-sided atelectasis versus consolidation, CT angiogram chest done was negative for large central PE but with suboptimal contrast timing and concern for possible right-sided consolidation.  Patient admitted, placed empirically on IV Rocephin and azithromycin.   Assessment & Plan:   Principal Problem:   Acute hypoxemic respiratory failure (HCC) Active Problems:   CAP (community acquired pneumonia)   Coronary artery disease involving native coronary artery with angina pectoris (HCC)   Essential hypertension   OSA on CPAP   Hyperlipidemia LDL goal <70   Hypokalemia   Anemia  #1 acute hypoxemic respiratory failure secondary to community-acquired pneumonia -Patient presented with hypoxia noted to have sats of 70% per her pulse ox at home prior to admission. -Patient noted to have presented with a productive cough of greenish sputum, gradual worsening shortness of breath, low-grade fevers. -Chest x-ray done with right-sided atelectasis versus consultation. -CT angiogram chest done was negative for large central PE however contrast timing was not optimal. -Lower extremity Dopplers done negative for DVT. -Check a sputum Gram stain and culture. -SARS coronavirus 2 PCR negative, influenza A  and B PCR negative, RSV PCR negative. -Check a sputum Gram stain and culture. -Repeat chest x-ray. -Blood cultures pending. -Continue empiric IV Rocephin, IV azithromycin. -Place on Flonase, Mucinex, Claritin, Dulera, nebs as needed.  2.  Status post right shoulder arthroplasty -Continue scheduled Tylenol, as needed tramadol. -It is noted that patient does not tolerate narcotics. -PT/OT -Outpatient follow-up with orthopedics.  Called  3.  Polymyalgia rheumatica -Patient noted to have received stress dose steroids on presentation to the ED, as well as IV steroids prior to CT scan due to contrast allergy. -Continue home regimen steroids.  4.  Hypertension -Continue home regimen Toprol-XL.  5.  OSA -CPAP nightly.  6.  Hypokalemia -Replete.  7.  Anemia -Patient with no overt bleeding. -Likely secondary to a postop anemia. -Anemia panel with iron level of 27, TIBC of 304, ferritin of 72, folate of 18.3, vitamin B12 of 392. -Follow H&H. -Transfusion threshold hemoglobin < 8.    DVT prophylaxis: Lovenox Code Status: Full Family Communication: Updated patient, husband, daughter at bedside. Disposition: Likely home when clinically improved.  Status is: Inpatient The patient will require care spanning > 2 midnights and should be moved to inpatient because: Severity of illness   Consultants:  None  Procedures:  CT angiogram chest 05/17/2023 Chest x-ray 05/16/2023, 05/18/2023 Lower extremity Dopplers 05/17/2023   Antimicrobials:  Anti-infectives (From admission, onward)    Start     Dose/Rate Route Frequency Ordered Stop   05/18/23 0800  azithromycin (ZITHROMAX) 500 mg in sodium chloride 0.9 % 250 mL IVPB        500 mg 250 mL/hr over 60 Minutes Intravenous Every 24 hours 05/17/23 0933     05/18/23 0700  PROGRESS NOTE    Tracey [name]  WUJ:[id number] DOB: 1945-10-11 DOA: 05/16/2023 PCP: Alysia Penna, MD    Chief Complaint  Patient presents with   Shortness of Breath    Brief Narrative:  Patient pleasant 77 year old female history of breast cancer, CAD on Plavix, GERD, hypertension, OSA, PMR on chronic prednisone presented to the ED with community-acquired pneumonia after right shoulder replacement.  Patient notes that since shoulder replacement from 9/26 had been having gradual worsening onset of shortness of breath, low-grade fever at, productive cough of greenish sputum.  Patient checked his sats at home noted to be in the 77s and presented to the ED.  Patient seen in the ED chest x-ray noted a right-sided atelectasis versus consolidation, CT angiogram chest done was negative for large central PE but with suboptimal contrast timing and concern for possible right-sided consolidation.  Patient admitted, placed empirically on IV Rocephin and azithromycin.   Assessment & Plan:   Principal Problem:   Acute hypoxemic respiratory failure (HCC) Active Problems:   CAP (community acquired pneumonia)   Coronary artery disease involving native coronary artery with angina pectoris (HCC)   Essential hypertension   OSA on CPAP   Hyperlipidemia LDL goal <70   Hypokalemia   Anemia  #1 acute hypoxemic respiratory failure secondary to community-acquired pneumonia -Patient presented with hypoxia noted to have sats of 70% per her pulse ox at home prior to admission. -Patient noted to have presented with a productive cough of greenish sputum, gradual worsening shortness of breath, low-grade fevers. -Chest x-ray done with right-sided atelectasis versus consultation. -CT angiogram chest done was negative for large central PE however contrast timing was not optimal. -Lower extremity Dopplers done negative for DVT. -Check a sputum Gram stain and culture. -SARS coronavirus 2 PCR negative, influenza A  and B PCR negative, RSV PCR negative. -Check a sputum Gram stain and culture. -Repeat chest x-ray. -Blood cultures pending. -Continue empiric IV Rocephin, IV azithromycin. -Place on Flonase, Mucinex, Claritin, Dulera, nebs as needed.  2.  Status post right shoulder arthroplasty -Continue scheduled Tylenol, as needed tramadol. -It is noted that patient does not tolerate narcotics. -PT/OT -Outpatient follow-up with orthopedics.  Called  3.  Polymyalgia rheumatica -Patient noted to have received stress dose steroids on presentation to the ED, as well as IV steroids prior to CT scan due to contrast allergy. -Continue home regimen steroids.  4.  Hypertension -Continue home regimen Toprol-XL.  5.  OSA -CPAP nightly.  6.  Hypokalemia -Replete.  7.  Anemia -Patient with no overt bleeding. -Likely secondary to a postop anemia. -Anemia panel with iron level of 27, TIBC of 304, ferritin of 72, folate of 18.3, vitamin B12 of 392. -Follow H&H. -Transfusion threshold hemoglobin < 8.    DVT prophylaxis: Lovenox Code Status: Full Family Communication: Updated patient, husband, daughter at bedside. Disposition: Likely home when clinically improved.  Status is: Inpatient The patient will require care spanning > 2 midnights and should be moved to inpatient because: Severity of illness   Consultants:  None  Procedures:  CT angiogram chest 05/17/2023 Chest x-ray 05/16/2023, 05/18/2023 Lower extremity Dopplers 05/17/2023   Antimicrobials:  Anti-infectives (From admission, onward)    Start     Dose/Rate Route Frequency Ordered Stop   05/18/23 0800  azithromycin (ZITHROMAX) 500 mg in sodium chloride 0.9 % 250 mL IVPB        500 mg 250 mL/hr over 60 Minutes Intravenous Every 24 hours 05/17/23 0933     05/18/23 0700  PENDING  Incomplete  Culture, blood (routine x 2)     Status: None (Preliminary result)   Collection Time: 05/16/23 10:20 PM   Specimen: BLOOD  Result Value Ref Range Status   Specimen Description   Final    BLOOD BLOOD LEFT ARM UPPER Performed at [facility], [address], [place], [place] [address]    Special Requests   Final    BOTTLES DRAWN AEROBIC AND ANAEROBIC Blood Culture adequate volume Performed at [facility], [address] [place], [place] [address]    Culture   Final    NO GROWTH < 24 HOURS Performed at [facility], [address] [place], [place] [address]    Report Status PENDING  Incomplete         Radiology Studies: DG CHEST PORT 1 VIEW  Result Date: 05/18/2023 CLINICAL DATA:  Pleural effusion. EXAM: PORTABLE CHEST 1 VIEW COMPARISON:  May 16, 2023. FINDINGS: Stable cardiomediastinal silhouette. Status post right shoulder arthroplasty. Left lung is clear. Elevated right hemidiaphragm is noted with minimal right basilar subsegmental atelectasis. No effusion is noted. IMPRESSION: Elevated right  hemidiaphragm with minimal right basilar subsegmental atelectasis. Electronically Signed   By: Lupita Raider M.D.   On: 05/18/2023 16:36   VAS [place] LOWER EXTREMITY VENOUS (DVT)  Result Date: 05/17/2023  Lower Venous DVT Study Patient Name:  Tracey [name]  Date of Exam:   05/17/2023 Medical Rec #: [id number]       Accession #:    [id number] Date of Birth: [birth year]       Patient Gender: F Patient Age:   13 years Exam Location:  [facility] Procedure:      VAS [place] LOWER EXTREMITY VENOUS (DVT) Referring Phys: [facility] --------------------------------------------------------------------------------  Indications: Acute hypoxic respiratory failure.  Comparison Study: Previous RLEV on 12/12/2021 & LLEV on 08/17/2018 were negative                   for DVT. Performing Technologist: Ernestene Mention RVT, RDMS  Examination Guidelines: A complete evaluation includes B-mode imaging, spectral Doppler, color Doppler, and power Doppler as needed of all accessible portions of each vessel. Bilateral testing is considered an integral part of a complete examination. Limited examinations for reoccurring indications may be performed as noted. The reflux portion of the exam is performed with the patient in reverse Trendelenburg.  +---------+---------------+---------+-----------+----------+--------------+ RIGHT    CompressibilityPhasicitySpontaneityPropertiesThrombus Aging +---------+---------------+---------+-----------+----------+--------------+ CFV      Full           Yes      Yes                                 +---------+---------------+---------+-----------+----------+--------------+ SFJ      Full                                                        +---------+---------------+---------+-----------+----------+--------------+ FV Prox  Full           Yes      Yes                                 +---------+---------------+---------+-----------+----------+--------------+ FV Mid   Full  PROGRESS NOTE    Tracey [name]  WUJ:[id number] DOB: 1945-10-11 DOA: 05/16/2023 PCP: Alysia Penna, MD    Chief Complaint  Patient presents with   Shortness of Breath    Brief Narrative:  Patient pleasant 77 year old female history of breast cancer, CAD on Plavix, GERD, hypertension, OSA, PMR on chronic prednisone presented to the ED with community-acquired pneumonia after right shoulder replacement.  Patient notes that since shoulder replacement from 9/26 had been having gradual worsening onset of shortness of breath, low-grade fever at, productive cough of greenish sputum.  Patient checked his sats at home noted to be in the 77s and presented to the ED.  Patient seen in the ED chest x-ray noted a right-sided atelectasis versus consolidation, CT angiogram chest done was negative for large central PE but with suboptimal contrast timing and concern for possible right-sided consolidation.  Patient admitted, placed empirically on IV Rocephin and azithromycin.   Assessment & Plan:   Principal Problem:   Acute hypoxemic respiratory failure (HCC) Active Problems:   CAP (community acquired pneumonia)   Coronary artery disease involving native coronary artery with angina pectoris (HCC)   Essential hypertension   OSA on CPAP   Hyperlipidemia LDL goal <70   Hypokalemia   Anemia  #1 acute hypoxemic respiratory failure secondary to community-acquired pneumonia -Patient presented with hypoxia noted to have sats of 70% per her pulse ox at home prior to admission. -Patient noted to have presented with a productive cough of greenish sputum, gradual worsening shortness of breath, low-grade fevers. -Chest x-ray done with right-sided atelectasis versus consultation. -CT angiogram chest done was negative for large central PE however contrast timing was not optimal. -Lower extremity Dopplers done negative for DVT. -Check a sputum Gram stain and culture. -SARS coronavirus 2 PCR negative, influenza A  and B PCR negative, RSV PCR negative. -Check a sputum Gram stain and culture. -Repeat chest x-ray. -Blood cultures pending. -Continue empiric IV Rocephin, IV azithromycin. -Place on Flonase, Mucinex, Claritin, Dulera, nebs as needed.  2.  Status post right shoulder arthroplasty -Continue scheduled Tylenol, as needed tramadol. -It is noted that patient does not tolerate narcotics. -PT/OT -Outpatient follow-up with orthopedics.  Called  3.  Polymyalgia rheumatica -Patient noted to have received stress dose steroids on presentation to the ED, as well as IV steroids prior to CT scan due to contrast allergy. -Continue home regimen steroids.  4.  Hypertension -Continue home regimen Toprol-XL.  5.  OSA -CPAP nightly.  6.  Hypokalemia -Replete.  7.  Anemia -Patient with no overt bleeding. -Likely secondary to a postop anemia. -Anemia panel with iron level of 27, TIBC of 304, ferritin of 72, folate of 18.3, vitamin B12 of 392. -Follow H&H. -Transfusion threshold hemoglobin < 8.    DVT prophylaxis: Lovenox Code Status: Full Family Communication: Updated patient, husband, daughter at bedside. Disposition: Likely home when clinically improved.  Status is: Inpatient The patient will require care spanning > 2 midnights and should be moved to inpatient because: Severity of illness   Consultants:  None  Procedures:  CT angiogram chest 05/17/2023 Chest x-ray 05/16/2023, 05/18/2023 Lower extremity Dopplers 05/17/2023   Antimicrobials:  Anti-infectives (From admission, onward)    Start     Dose/Rate Route Frequency Ordered Stop   05/18/23 0800  azithromycin (ZITHROMAX) 500 mg in sodium chloride 0.9 % 250 mL IVPB        500 mg 250 mL/hr over 60 Minutes Intravenous Every 24 hours 05/17/23 0933     05/18/23 0700  PENDING  Incomplete  Culture, blood (routine x 2)     Status: None (Preliminary result)   Collection Time: 05/16/23 10:20 PM   Specimen: BLOOD  Result Value Ref Range Status   Specimen Description   Final    BLOOD BLOOD LEFT ARM UPPER Performed at [facility], [address], [place], [place] [address]    Special Requests   Final    BOTTLES DRAWN AEROBIC AND ANAEROBIC Blood Culture adequate volume Performed at [facility], [address] [place], [place] [address]    Culture   Final    NO GROWTH < 24 HOURS Performed at [facility], [address] [place], [place] [address]    Report Status PENDING  Incomplete         Radiology Studies: DG CHEST PORT 1 VIEW  Result Date: 05/18/2023 CLINICAL DATA:  Pleural effusion. EXAM: PORTABLE CHEST 1 VIEW COMPARISON:  May 16, 2023. FINDINGS: Stable cardiomediastinal silhouette. Status post right shoulder arthroplasty. Left lung is clear. Elevated right hemidiaphragm is noted with minimal right basilar subsegmental atelectasis. No effusion is noted. IMPRESSION: Elevated right  hemidiaphragm with minimal right basilar subsegmental atelectasis. Electronically Signed   By: Lupita Raider M.D.   On: 05/18/2023 16:36   VAS [place] LOWER EXTREMITY VENOUS (DVT)  Result Date: 05/17/2023  Lower Venous DVT Study Patient Name:  Tracey [name]  Date of Exam:   05/17/2023 Medical Rec #: [id number]       Accession #:    [id number] Date of Birth: [birth year]       Patient Gender: F Patient Age:   13 years Exam Location:  [facility] Procedure:      VAS [place] LOWER EXTREMITY VENOUS (DVT) Referring Phys: [facility] --------------------------------------------------------------------------------  Indications: Acute hypoxic respiratory failure.  Comparison Study: Previous RLEV on 12/12/2021 & LLEV on 08/17/2018 were negative                   for DVT. Performing Technologist: Ernestene Mention RVT, RDMS  Examination Guidelines: A complete evaluation includes B-mode imaging, spectral Doppler, color Doppler, and power Doppler as needed of all accessible portions of each vessel. Bilateral testing is considered an integral part of a complete examination. Limited examinations for reoccurring indications may be performed as noted. The reflux portion of the exam is performed with the patient in reverse Trendelenburg.  +---------+---------------+---------+-----------+----------+--------------+ RIGHT    CompressibilityPhasicitySpontaneityPropertiesThrombus Aging +---------+---------------+---------+-----------+----------+--------------+ CFV      Full           Yes      Yes                                 +---------+---------------+---------+-----------+----------+--------------+ SFJ      Full                                                        +---------+---------------+---------+-----------+----------+--------------+ FV Prox  Full           Yes      Yes                                 +---------+---------------+---------+-----------+----------+--------------+ FV Mid   Full

## 2023-05-18 NOTE — Evaluation (Signed)
Physical Therapy Evaluation Patient Details Name: Tracey [name] MRN: [id number] DOB: Feb 12, 1946 Today's Date: 05/18/2023  History of Present Illness  77 yo female presenting to therapy following hospital admission on 05/16/2023 due to PNA with acute hypoxemic respiratory failure. Pt underwent  R rTSA on 05/13/2023. Pt PMH includes but is not limited to: OA, CAD, hx of breast ca with b/l total mastectomy, GERD, gout, HTN hyperlipidemia, NSTEMI, tachycardia, R TKA 2019, and  L TKA on 12/02/21.  Clinical Impression      Pt admitted with above diagnosis.  Pt currently with functional limitations due to the deficits listed below (see PT Problem List). Pt in bed resting with CPAP donned when PT arrived, daughter and husband present. Family indicated pt had not slept well overnight. PT stated would return however pt awoke and reported need to void. PT provided cues for pt and daughter to ensure that PT assisted pt with mobility tasks and need to maneuver IV pole in close proximity. Pt required min A for bed mobility and initial sit to stand  from EOB and then CGA from commode, pt able to maintain static standing balance with ADL tasks without UE support and mild deficits noted, pt required CGA, cues and IV pole for gait tasks in hallway 120 feet with one standing therapeutic rest break with PT assessing pt response and O2 saturation on RA. Pt left seated in recliner, R UE positioned with pillows for support and ice man machine filled and placed on R shoulder, all needs in place and family present. Pt will benefit from acute skilled PT to increase their independence and safety with mobility to allow discharge.       If plan is discharge home, recommend the following: A little help with walking and/or transfers;A little help with bathing/dressing/bathroom;Assistance with cooking/housework;Assist for transportation;Help with stairs or ramp for entrance   Can travel by private vehicle        Equipment  Recommendations None recommended by PT  Recommendations for Other Services       Functional Status Assessment Patient has had a recent decline in their functional status and demonstrates the ability to make significant improvements in function in a reasonable and predictable amount of time.     Precautions / Restrictions Precautions Precautions: Shoulder Shoulder Interventions: Shoulder sling/immobilizer Precaution Comments: No BP to UEs Required Braces or Orthoses: Sling Restrictions Weight Bearing Restrictions: Yes RUE Weight Bearing: Non weight bearing Other Position/Activity Restrictions: OK to use operative arm for feeding, hygiene and ADLs.   Ok to instruct Pendulums and lap slides as exercises. Ok to use operative arm within the following parameters for ADL purposes     New ROM (1/19)   Ok for PROM, AAROM, AROM within pain tolerance and within the following ROM   ER 20   ABD 45   FE 60      Mobility  Bed Mobility Overal bed mobility: Needs Assistance Bed Mobility: Supine to Sit     Supine to sit: Min assist, HOB elevated     General bed mobility comments: min cues for R UE NWB and daughter wanting to assist due to pt reported need to void and associated urgency    Transfers Overall transfer level: Needs assistance Equipment used: None Transfers: Sit to/from Stand Sit to Stand: Min assist           General transfer comment: pt required min A for sit to stand from EOB and CGA for sit to stand from commode, cues for  proper L UE placement and eccentric control    Ambulation/Gait Ambulation/Gait assistance: Contact guard assist Gait Distance (Feet): 120 Feet Assistive device: IV Pole Gait Pattern/deviations: Step-to pattern Gait velocity: decreased     General Gait Details: pt amb with shortend stride length and limited foot clearance one standing rest break wtih amb bout with PT assessing pt response and O2 93-95% on RA  Stairs            Wheelchair  Mobility     Tilt Bed    Modified Rankin (Stroke Patients Only)       Balance Overall balance assessment: Mild deficits observed, not formally tested                                           Pertinent Vitals/Pain Pain Assessment Pain Assessment: 0-10 Pain Score: 4  Pain Location: RT shoulder Pain Descriptors / Indicators: Aching Pain Intervention(s): Limited activity within patient's tolerance, Monitored during session, Premedicated before session, Repositioned, Ice applied, Patient requesting pain meds-RN notified    Home Living Family/patient expects to be discharged to:: Private residence Living Arrangements: Spouse/significant other Available Help at Discharge: Available 24 hours/day;Family Type of Home: House Home Access: Stairs to enter Entrance Stairs-Rails: None Entrance Stairs-Number of Steps: 2 Alternate Level Stairs-Number of Steps: flight Home Layout: Two level;Bed/bath upstairs Home Equipment: Shower seat - built in;Cane - single point;BSC/3in1;Hand held shower head Additional Comments: bidet    Prior Function Prior Level of Function : Driving;Independent/Modified Independent             Mobility Comments: SPC used to walk her dogs. or PRN ADLs Comments: s/p R TSA pt was requiring some assist for ADLs     Extremity/Trunk Assessment        Lower Extremity Assessment Lower Extremity Assessment: Overall WFL for tasks assessed       Communication   Communication Communication: No apparent difficulties  Cognition Arousal: Alert Behavior During Therapy: WFL for tasks assessed/performed, Anxious Overall Cognitive Status: Within Functional Limits for tasks assessed                                 General Comments: Required frequent redirection to education.        General Comments      Exercises     Assessment/Plan    PT Assessment Patient needs continued PT services  PT Problem List Decreased  strength;Decreased range of motion;Decreased activity tolerance;Decreased balance;Decreased mobility;Decreased coordination;Pain       PT Treatment Interventions DME instruction;Gait training;Stair training;Functional mobility training;Therapeutic activities;Therapeutic exercise;Balance training;Neuromuscular re-education;Patient/family education;Modalities    PT Goals (Current goals can be found in the Care Plan section)  Acute Rehab PT Goals Patient Stated Goal: to be able to go back home PT Goal Formulation: With patient Time For Goal Achievement: 06/01/23 Potential to Achieve Goals: Good    Frequency Min 1X/week     Co-evaluation               AM-PAC PT "6 Clicks" Mobility  Outcome Measure Help needed turning from your back to your side while in a flat bed without using bedrails?: A Little Help needed moving from lying on your back to sitting on the side of a flat bed without using bedrails?: A Little Help needed moving to and from a bed to  a chair (including a wheelchair)?: A Little Help needed standing up from a chair using your arms (e.g., wheelchair or bedside chair)?: A Little Help needed to walk in hospital room?: A Little Help needed climbing 3-5 steps with a railing? : A Lot 6 Click Score: 17    End of Session Equipment Utilized During Treatment: Gait belt Activity Tolerance: Patient tolerated treatment well Patient left: in chair;with call bell/phone within reach;with family/visitor present Nurse Communication: Mobility status;Patient requests pain meds PT Visit Diagnosis: Unsteadiness on feet (R26.81);Other abnormalities of gait and mobility (R26.89);Difficulty in walking, not elsewhere classified (R26.2)    Time: 1030-1101 PT Time Calculation (min) (ACUTE ONLY): 31 min   Charges:   PT Evaluation $PT Eval Low Complexity: 1 Low PT Treatments $Gait Training: 8-22 mins PT General Charges $$ ACUTE PT VISIT: 1 Visit         Johnny Bridge, PT Acute  Rehab   Jacqualyn Posey 05/18/2023, 4:15 PM

## 2023-05-18 NOTE — TOC Initial Note (Signed)
Transition of Care [facility] - Initial/Assessment Note    Patient Details  Name: Tracey [name] MRN: [id number] Date of Birth: Aug 29, 1945  Transition of Care [facility]) CM/SW Contact:    Lanier Clam, RN Phone Number: 05/18/2023, 4:02 PM  Clinical Narrative: d/c plan home. Monitor for d/c needs.                  Expected Discharge Plan: Home/Self Care Barriers to Discharge: Continued Medical Work up   Patient Goals and CMS Choice            Expected Discharge Plan and Services         Expected Discharge Date: 05/19/23                                    Prior Living Arrangements/Services                       Activities of Daily Living Home Assistive Devices/Equipment: Blood pressure cuff, Cane (specify quad or straight), Walker (specify type), Shower chair without back ADL Screening (condition at time of admission) Independently performs ADLs?: No Does the patient have a NEW difficulty with bathing/dressing/toileting/self-feeding that is expected to last >3 days?: No Does the patient have a NEW difficulty with getting in/out of bed, walking, or climbing stairs that is expected to last >3 days?: No Does the patient have a NEW difficulty with communication that is expected to last >3 days?: No Is the patient deaf or have difficulty hearing?: No Does the patient have difficulty seeing, even when wearing glasses/contacts?: No Does the patient have difficulty concentrating, remembering, or making decisions?: No  Permission Sought/Granted                  Emotional Assessment              Admission diagnosis:  Hypoxemia [R09.02] Acute hypoxemic respiratory failure (HCC) [J96.01] Pneumonia of right lower lobe due to infectious organism [J18.9] Patient Active Problem List   Diagnosis Date Noted   Acute hypoxemic respiratory failure (HCC) 05/17/2023   S/P reverse total shoulder arthroplasty, right 05/13/2023   Fever of unknown origin 06/10/2022    Stage 3b chronic kidney disease (HCC) 06/10/2022   ARF (acute renal failure) (HCC) 12/30/2021   DOE (dyspnea on exertion) 12/29/2021   S/P total knee arthroplasty, left 12/02/2021   Preoperative cardiovascular examination 11/18/2021   REM behavioral disorder 06/12/2021   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   CPAP (continuous positive airway pressure) dependence 01/20/2021   Unsteady gait when walking 01/20/2021   Complaint related to dreams 06/05/2019   Metabolic acidosis with increased anion gap and accumulation of organic acids 08/17/2018   Nausea, vomiting, and diarrhea 08/17/2018   AKI (acute kidney injury) (HCC) 08/17/2018   Overweight (BMI 25.0-29.9) 07/27/2018   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/27/2018   S/P right TKA 07/26/2018   S/P knee replacement 07/26/2018   UARS (upper airway resistance syndrome) 05/30/2018   Hyperlipidemia LDL goal <70 05/02/2018   Leg injury, right, initial encounter 01/06/2017   Contusion of right lower leg 01/06/2017   Hypoxemia 12/23/2015   Statin myopathy 05/20/2015   MCI (mild cognitive impairment) 05/20/2015   Edema 05/17/2015   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   Breast cancer genetic susceptibility 04/02/2015   OSA on CPAP 04/02/2015   Coronary artery disease involving native  coronary artery with angina pectoris (HCC) 01/01/2014   Chest pain 01/01/2014   Essential hypertension    Ventricular tachycardia, non-sustained (HCC)    Breast cancer of upper-outer quadrant of right female breast (HCC) 06/28/2013   Postoperative visit 06/02/2013   Gout    Tachycardia    PCP:  Alysia Penna, MD Pharmacy:   [id number] [place], [place] - [address] AT [facility] OF [facility] [address] [place] [address] Phone: [phone] Fax: [phone]     Social Determinants of Health (SDOH) Social History: SDOH Screenings   Food Insecurity: Patient  Declined (05/17/2023)  Housing: Patient Declined (05/17/2023)  Transportation Needs: Patient Declined (05/17/2023)  Utilities: Patient Declined (05/17/2023)  Tobacco Use: Medium Risk (05/16/2023)   SDOH Interventions:     Readmission Risk Interventions     No data to display

## 2023-05-18 NOTE — Progress Notes (Signed)
   05/18/23 2357  BiPAP/CPAP/SIPAP  BiPAP/CPAP/SIPAP Pt Type Adult  BiPAP/CPAP/SIPAP DREAMSTATIOND  Mask Type Nasal pillows  Respiratory Rate 16 breaths/min  EPAP 10 cmH2O  Flow Rate 2 lpm  Patient Home Equipment Yes (Only Nasal Pillows)  Auto Titrate No  CPAP/SIPAP surface wiped down Yes   Pt. found currently on CPAP with 2 lpm n/c on/connected to circuit, made aware to notify if needed.

## 2023-05-18 NOTE — Care Management Obs Status (Signed)
MEDICARE OBSERVATION STATUS NOTIFICATION   Patient Details  Name: VANASSA PENNIMAN MRN: [id number] Date of Birth: 20-Aug-1945   Medicare Observation Status Notification Given:  Yes    MahabirOlegario Messier, RN 05/18/2023, 4:01 PM

## 2023-05-19 ENCOUNTER — Inpatient Hospital Stay (HOSPITAL_COMMUNITY): Payer: Medicare Other

## 2023-05-19 DIAGNOSIS — G4733 Obstructive sleep apnea (adult) (pediatric): Secondary | ICD-10-CM | POA: Diagnosis not present

## 2023-05-19 DIAGNOSIS — I10 Essential (primary) hypertension: Secondary | ICD-10-CM | POA: Diagnosis not present

## 2023-05-19 DIAGNOSIS — M7989 Other specified soft tissue disorders: Secondary | ICD-10-CM

## 2023-05-19 DIAGNOSIS — E876 Hypokalemia: Secondary | ICD-10-CM | POA: Diagnosis not present

## 2023-05-19 DIAGNOSIS — J9601 Acute respiratory failure with hypoxia: Secondary | ICD-10-CM | POA: Diagnosis not present

## 2023-05-19 LAB — CBC WITH DIFFERENTIAL/PLATELET
Abs Immature Granulocytes: 0.03 10*3/uL (ref 0.00–0.07)
Basophils Absolute: 0 10*3/uL (ref 0.0–0.1)
Basophils Relative: 0 %
Eosinophils Absolute: 0.2 10*3/uL (ref 0.0–0.5)
Eosinophils Relative: 2 %
HCT: 32.2 % — ABNORMAL LOW (ref 36.0–46.0)
Hemoglobin: 9.8 g/dL — ABNORMAL LOW (ref 12.0–15.0)
Immature Granulocytes: 0 %
Lymphocytes Relative: 22 %
Lymphs Abs: 1.6 10*3/uL (ref 0.7–4.0)
MCH: 29.6 pg (ref 26.0–34.0)
MCHC: 30.4 g/dL (ref 30.0–36.0)
MCV: 97.3 fL (ref 80.0–100.0)
Monocytes Absolute: 0.8 10*3/uL (ref 0.1–1.0)
Monocytes Relative: 11 %
Neutro Abs: 4.8 10*3/uL (ref 1.7–7.7)
Neutrophils Relative %: 65 %
Platelets: 183 10*3/uL (ref 150–400)
RBC: 3.31 MIL/uL — ABNORMAL LOW (ref 3.87–5.11)
RDW: 13.6 % (ref 11.5–15.5)
WBC: 7.5 10*3/uL (ref 4.0–10.5)
nRBC: 0 % (ref 0.0–0.2)

## 2023-05-19 LAB — BASIC METABOLIC PANEL
Anion gap: 9 (ref 5–15)
BUN: 24 mg/dL — ABNORMAL HIGH (ref 8–23)
CO2: 23 mmol/L (ref 22–32)
Calcium: 8.2 mg/dL — ABNORMAL LOW (ref 8.9–10.3)
Chloride: 107 mmol/L (ref 98–111)
Creatinine, Ser: 1.11 mg/dL — ABNORMAL HIGH (ref 0.44–1.00)
GFR, Estimated: 51 mL/min — ABNORMAL LOW (ref >60)
Glucose, Bld: 81 mg/dL (ref 70–99)
Potassium: 4.2 mmol/L (ref 3.5–5.1)
Sodium: 139 mmol/L (ref 135–145)

## 2023-05-19 LAB — MAGNESIUM: Magnesium: 2.2 mg/dL (ref 1.7–2.4)

## 2023-05-19 LAB — HIV ANTIBODY (ROUTINE TESTING W REFLEX): HIV Screen 4th Generation wRfx: NONREACTIVE

## 2023-05-19 NOTE — Progress Notes (Signed)
   05/19/23 2109  BiPAP/CPAP/SIPAP  BiPAP/CPAP/SIPAP Pt Type Adult  BiPAP/CPAP/SIPAP DREAMSTATIOND  Mask Type Nasal pillows  Respiratory Rate 18 breaths/min  EPAP 10 cmH2O  Flow Rate 2 lpm  Patient Home Equipment No  Auto Titrate No

## 2023-05-19 NOTE — Progress Notes (Signed)
LUE venous duplex has been completed.    Results can be found under chart review under CV PROC. 05/19/2023 4:32 PM Machi Whittaker RVT, RDMS

## 2023-05-19 NOTE — Progress Notes (Signed)
[facility]                                                                               Tracey [name], is a 77 y.o. female, DOB - May 22, 1946, YQM:[id number] Admit date - 05/16/2023    Outpatient Primary MD for the patient is Alysia Penna, MD  LOS - 1  days    Brief summary    77 year old female history of breast cancer, CAD on Plavix, GERD, hypertension, OSA, PMR on chronic prednisone presented to the ED with community-acquired pneumonia after right shoulder replacement. Patient notes that since shoulder replacement from 9/26 had been having gradual worsening onset of shortness of breath, low-grade fever at, productive cough of greenish sputum. Patient checked his sats at home noted to be in the 71s and presented to the ED.  CT angiogram chest done was negative for large central PE but with suboptimal contrast timing and concern for possible right-sided consolidation.  Patient admitted, placed empirically on IV Rocephin and azithromycin.   Assessment & Plan    Assessment and Plan: Acute hypoxic respiratory failure secondary to community-acquired pneumonia CT angiogram of the chest is negative for PE Lower extremity Dopplers negative for DVT Continue with empiric IV antibiotics Negative blood cultures so far Patient reports shortness of breath on bending forward and relief when laying on the back, obtain echocardiogram to rule out pericardial effusion Continue with Flonase, Mucinex, Claritin, Dulera and DuoNebs    S/p recent right shoulder arthroplasty Pain control outpatient follow-up with orthopedics    History of polymyalgia rheumatica Resume home dose of steroids.    Hypertension Blood pressure parameters are optimal   Hypokalemia Replaced   Obstructive sleep apnea On CPAP at night    Anemia of chronic disease With a component of postop anemia Transfuse to keep hemoglobin greater than 8.    Left upper swelling and tenderness with  bruising, From IV line  Get venous duplex to evaluate for DVT.   Estimated body mass index is 27.07 kg/m as calculated from the following:   Height as of 05/13/23: 5\' 2"  (1.575 m).   Weight as of 05/13/23: 67.1 kg.  Code Status: full code  DVT Prophylaxis:  enoxaparin (LOVENOX) injection 40 mg Start: 05/17/23 1030 SCDs Start: 05/17/23 0932   Level of Care: Level of care: Telemetry Family Communication: at bedside.   Disposition Plan:     Remains inpatient appropriate:  IV antibiotics, echocardiogram.   Procedures:  Echocardiogram.   Consultants:   None.   Antimicrobials:   Anti-infectives (From admission, onward)    Start     Dose/Rate Route Frequency Ordered Stop   05/18/23 0800  azithromycin (ZITHROMAX) 500 mg in sodium chloride 0.9 % 250 mL IVPB        500 mg 250 mL/hr over 60 Minutes Intravenous Every 24 hours 05/17/23 0933     05/18/23 0700  cefTRIAXone (ROCEPHIN) 1 g in sodium chloride 0.9 % 100 mL IVPB        1 g 200 mL/hr over 30 Minutes Intravenous Every 24 hours 05/17/23 0933     05/17/23 1230  hydroxychloroquine (PLAQUENIL) tablet 300 mg  300 mg Oral Daily 05/17/23 0933     05/17/23 0615  cefTRIAXone (ROCEPHIN) 1 g in sodium chloride 0.9 % 100 mL IVPB        1 g 200 mL/hr over 30 Minutes Intravenous  Once 05/17/23 0611 05/17/23 0659   05/17/23 0615  azithromycin (ZITHROMAX) 500 mg in sodium chloride 0.9 % 250 mL IVPB        500 mg 250 mL/hr over 60 Minutes Intravenous  Once 05/17/23 8841 05/17/23 0802        Medications  Scheduled Meds:  acetaminophen  650 mg Oral Q6H   allopurinol  100 mg Oral Daily   aspirin  81 mg Oral Daily   cholecalciferol  2,000 Units Oral Daily   enoxaparin (LOVENOX) injection  40 mg Subcutaneous Q24H   ezetimibe  10 mg Oral Daily   fluticasone  2 spray Each Nare Daily   folic acid  1 mg Oral Daily   guaiFENesin  1,200 mg Oral BID   hydroxychloroquine  300 mg Oral Daily   loratadine  10 mg Oral Daily   metoprolol  succinate  25 mg Oral Daily   mometasone-formoterol  2 puff Inhalation BID   olopatadine  1 drop Both Eyes BID   pantoprazole  40 mg Oral Daily   predniSONE  9 mg Oral Q breakfast   rosuvastatin  20 mg Oral Daily   sertraline  50 mg Oral Daily   traMADol  100 mg Oral BID   Continuous Infusions:  azithromycin 500 mg (05/19/23 0844)   cefTRIAXone (ROCEPHIN)  IV 1 g (05/19/23 0708)   PRN Meds:.albuterol, benzonatate, ondansetron **OR** ondansetron (ZOFRAN) IV, polyethylene glycol, traMADol, traZODone    Subjective:   Tracey [name] was seen and examined today.  Wants to go home.   Objective:   Vitals:   05/18/23 1119 05/18/23 2138 05/19/23 0622 05/19/23 0755  BP:  (!) 147/56 (!) 147/58   Pulse: 71 65 62   Resp:  18 16   Temp:  98.2 F (36.8 C) 97.8 F (36.6 C)   TempSrc:  Oral Oral   SpO2:  100% 100% 97%    Intake/Output Summary (Last 24 hours) at 05/19/2023 1421 Last data filed at 05/19/2023 1300 Gross per 24 hour  Intake 480 ml  Output --  Net 480 ml   There were no vitals filed for this visit.   Exam General: Alert and oriented x 3, NAD Cardiovascular: S1 S2 auscultated, no murmurs, RRR Respiratory: Diminished air entry at bases.  Gastrointestinal: Soft, nontender, nondistended, + bowel sounds Ext: left arm swollen and tenderness.  Neuro: AAOx3, Cr N's II- XII. Strength 5/5 upper and lower extremities bilaterally Skin: No rashes Psych: Normal affect and demeanor, alert and oriented x3    Data Reviewed:  I have personally reviewed following labs and imaging studies   CBC Lab Results  Component Value Date   WBC 7.5 05/19/2023   RBC 3.31 (L) 05/19/2023   HGB 9.8 (L) 05/19/2023   HCT 32.2 (L) 05/19/2023   MCV 97.3 05/19/2023   MCH 29.6 05/19/2023   PLT 183 05/19/2023   MCHC 30.4 05/19/2023   RDW 13.6 05/19/2023   LYMPHSABS 1.6 05/19/2023   MONOABS 0.8 05/19/2023   EOSABS 0.2 05/19/2023   BASOSABS 0.0 05/19/2023     Last metabolic panel Lab  Results  Component Value Date   NA 139 05/19/2023   K 4.2 05/19/2023   CL 107 05/19/2023   CO2 23 05/19/2023   BUN  24 (H) 05/19/2023   CREATININE 1.11 (H) 05/19/2023   GLUCOSE 81 05/19/2023   GFRNONAA 51 (L) 05/19/2023   GFRAA 45 (L) 03/28/2020   CALCIUM 8.2 (L) 05/19/2023   PROT 6.3 (L) 05/16/2023   ALBUMIN 3.8 05/16/2023   LABGLOB 2.3 01/20/2018   AGRATIO 1.9 01/20/2018   BILITOT 0.5 05/16/2023   ALKPHOS 51 05/16/2023   AST 25 05/16/2023   ALT 19 05/16/2023   ANIONGAP 9 05/19/2023    CBG (last 3)  No results for input(s): "GLUCAP" in the last 72 hours.    Coagulation Profile: No results for input(s): "INR", "PROTIME" in the last 168 hours.   Radiology Studies: DG CHEST PORT 1 VIEW  Result Date: 05/18/2023 CLINICAL DATA:  Pleural effusion. EXAM: PORTABLE CHEST 1 VIEW COMPARISON:  May 16, 2023. FINDINGS: Stable cardiomediastinal silhouette. Status post right shoulder arthroplasty. Left lung is clear. Elevated right hemidiaphragm is noted with minimal right basilar subsegmental atelectasis. No effusion is noted. IMPRESSION: Elevated right hemidiaphragm with minimal right basilar subsegmental atelectasis. Electronically Signed   By: Lupita Raider M.D.   On: 05/18/2023 16:36       Kathlen Mody M.D. [facility] 05/19/2023, 2:21 PM  Available via Epic secure chat 7am-7pm After 7 pm, please refer to night coverage provider listed on amion.

## 2023-05-19 NOTE — Evaluation (Signed)
Occupational Therapy Evaluation Patient Details Name: Tracey [name] MRN: [id number] DOB: Jun 11, 1946 Today's Date: 05/19/2023   History of Present Illness 77 yr old female presenting to therapy following hospital admission on 05/16/2023 due to PNA with acute hypoxemic respiratory failure. Pt underwent  R TSA on 05/13/2023. PMH: OA, CAD, hx of breast ca with b/l total mastectomy, GERD, gout, HTN hyperlipidemia, NSTEMI, tachycardia, R TKA 2019, and  L TKA on 12/02/21.   Clinical Impression   Pt is currently presenting below her baseline level of functioning for ADL management, given the below listed deficits (see OT problem list). She reported feelings of shortness of breath and weakness, since her recent R TSA on 05-13-23. She was only home 2 days after having the R TSA before being brought to the hospital. She was requiring assist for self care management after surgery. She will benefit from OT services in the acute care setting for ADL instruction, to reinforce post-op precautions, and to facilitate her safe return home.       If plan is discharge home, recommend the following: A little help with bathing/dressing/bathroom;Assist for transportation;Assistance with cooking/housework    Functional Status Assessment  Patient has had a recent decline in their functional status and demonstrates the ability to make significant improvements in function in a reasonable and predictable amount of time.  Equipment Recommendations  None recommended by OT    Recommendations for Other Services       Precautions / Restrictions Precautions Precautions: Shoulder Shoulder Interventions: Shoulder sling/immobilizer Precaution Booklet Issued: Yes (comment) Required Braces or Orthoses: Sling Restrictions Weight Bearing Restrictions: Yes RUE Weight Bearing: Non weight bearing Other Position/Activity Restrictions: OK to use operative arm for feeding, hygiene and ADLs.   Ok to instruct Pendulums and lap slides  as exercises. Ok to use operative arm within the following parameters for ADL purposes     New ROM (3/66)   Ok for PROM, AAROM, AROM within pain tolerance and within the following ROM   ER 20   ABD 45   FE 60      Mobility Bed Mobility          General bed mobility comments: Pt seen for bed level eval, with most emphasis placed on review of post-op shoulder precautions and recommendations for safe ADL participation          ADL either performed or assessed with clinical judgement   ADL Overall ADL's : Needs assistance/impaired Eating/Feeding: Set up;Sitting   Grooming: Set up;Sitting           Upper Body Dressing : Moderate assistance;Sitting   Lower Body Dressing: Minimal assistance;Sit to/from stand   Toilet Transfer: Ambulance person;Ambulation;Grab bars Toilet Transfer Details (indicate cue type and reason): at bathroom level, based on clinical judgement                  Pertinent Vitals/Pain Pain Assessment Pain Assessment: 0-10 Pain Score: 3  Pain Location: RT shoulder Pain Intervention(s): Limited activity within patient's tolerance, Monitored during session     Extremity/Trunk Assessment Upper Extremity Assessment Upper Extremity Assessment: Right hand dominant;LUE deficits/detail RUE:  (hand, wrist, and elbow ROM WFL. In sling) LUE Deficits / Details: AROM and strength WFL   Lower Extremity Assessment Lower Extremity Assessment: Overall WFL for tasks assessed       Communication Communication Communication: No apparent difficulties   Cognition Arousal: Alert Behavior During Therapy: WFL for tasks assessed/performed Overall Cognitive Status: Within Functional Limits for tasks assessed  General Comments: Oriented x4                Home Living Family/patient expects to be discharged to:: Private residence Living Arrangements: Spouse/significant other Available Help at Discharge: Family Type of Home:  House Home Access: Stairs to enter     Home Layout: Two level;Bed/bath upstairs               Home Equipment: Shower seat - built in;Cane - single point;BSC/3in1;Hand held Programmer, systems (2 wheels)          Prior Functioning/Environment Prior Level of Function : Driving;Independent/Modified Independent             Mobility Comments: Occasional use of cane. ADLs Comments: Prior to recent R TSA, she was independent with ADLs, cooking, and cleaning.        OT Problem List: Decreased range of motion;Decreased knowledge of precautions;Pain;Decreased strength      OT Treatment/Interventions: Self-care/ADL training;Therapeutic exercise;DME and/or AE instruction;Therapeutic activities;Patient/family education    OT Goals(Current goals can be found in the care plan section) Acute Rehab OT Goals OT Goal Formulation: With patient Time For Goal Achievement: 06/02/23 Potential to Achieve Goals: Good ADL Goals Pt Will Perform Upper Body Dressing: with set-up;sitting Pt Will Perform Lower Body Dressing: with set-up;sit to/from stand Pt Will Transfer to Toilet: with supervision;ambulating Pt Will Perform Toileting - Clothing Manipulation and hygiene: with supervision;sit to/from stand  OT Frequency: Min 1X/week       AM-PAC OT "6 Clicks" Daily Activity     Outcome Measure Help from another person eating meals?: A Little Help from another person taking care of personal grooming?: A Little Help from another person toileting, which includes using toliet, bedpan, or urinal?: A Little Help from another person bathing (including washing, rinsing, drying)?: A Lot Help from another person to put on and taking off regular upper body clothing?: A Lot Help from another person to put on and taking off regular lower body clothing?: A Little 6 Click Score: 16   End of Session Equipment Utilized During Treatment: Other (comment) (N/A) Nurse Communication: Mobility  status  Activity Tolerance: Patient tolerated treatment well Patient left: in bed;with call bell/phone within reach;with family/visitor present  OT Visit Diagnosis: Muscle weakness (generalized) (M62.81);Pain                Time: 1710-1727 OT Time Calculation (min): 17 min Charges:  OT General Charges $OT Visit: 1 Visit OT Evaluation $OT Eval Moderate Complexity: 1 Mod    Mathews Stuhr L Luisdaniel Kenton, OTR/L 05/19/2023, 5:40 PM

## 2023-05-19 NOTE — Progress Notes (Signed)
Physical Therapy Treatment Patient Details Name: Tracey [name] MRN: [id number] DOB: [birth year] Today's Date: 05/19/2023   History of Present Illness 77 yo female presenting to therapy following hospital admission on 05/16/2023 due to PNA with acute hypoxemic respiratory failure. Pt underwent  R rTSA on 05/13/2023. Pt PMH includes but is not limited to: OA, CAD, hx of breast ca with b/l total mastectomy, GERD, gout, HTN hyperlipidemia, NSTEMI, tachycardia, R TKA 2019, and  L TKA on 12/02/21.    PT Comments  Pt agreeable to working with therapy after using bathroom. Assisted pt into bathroom then she walked ~150 feet around unit with her cane. O2 97% on RA. Dyspnea 2/4. Pt reported some chest tightness with activity that resolved with rest. Per chart review, pt politely declined HHPT f/u.     If plan is discharge home, recommend the following: A little help with walking and/or transfers;A little help with bathing/dressing/bathroom;Assistance with cooking/housework;Assist for transportation;Help with stairs or ramp for entrance   Can travel by private vehicle        Equipment Recommendations  None recommended by PT    Recommendations for Other Services       Precautions / Restrictions Precautions Precautions: Shoulder Shoulder Interventions: Shoulder sling/immobilizer Precaution Comments: No BP to UEs Required Braces or Orthoses: Sling Restrictions Weight Bearing Restrictions: Yes RUE Weight Bearing: Non weight bearing     Mobility  Bed Mobility Overal bed mobility: Needs Assistance Bed Mobility: Supine to Sit, Sit to Supine     Supine to sit: Supervision, HOB elevated, Used rails Sit to supine: Supervision, HOB elevated, Used rails   General bed mobility comments: Supv for safety. Pt a bit impulsive due to urge to use bathroom. Total A to don sling prior to standing.    Transfers Overall transfer level: Needs assistance Equipment used: None Transfers: Sit to/from Stand Sit  to Stand: Contact guard assist           General transfer comment: CGA for safety.    Ambulation/Gait Ambulation/Gait assistance: Min assist Gait Distance (Feet): 150 Feet Assistive device: Straight cane Gait Pattern/deviations: Step-through pattern, Decreased stride length       General Gait Details: Intermittent assist to lightly steady. Slow gait speed. Dyspnea 2/4. O2 97%. Pt reported some chest tightness.   Stairs             Wheelchair Mobility     Tilt Bed    Modified Rankin (Stroke Patients Only)       Balance Overall balance assessment: Needs assistance         Standing balance support: Single extremity supported, During functional activity Standing balance-Leahy Scale: Fair                              Cognition Arousal: Alert Behavior During Therapy: WFL for tasks assessed/performed Overall Cognitive Status: Within Functional Limits for tasks assessed                                          Exercises      General Comments        Pertinent Vitals/Pain Pain Assessment Pain Assessment: Faces Faces Pain Scale: Hurts little more Pain Location: RT shoulder Pain Descriptors / Indicators: Discomfort, Sore Pain Intervention(s): Monitored during session, Repositioned, Ice applied    Home Living  Prior Function            PT Goals (current goals can now be found in the care plan section) Progress towards PT goals: Progressing toward goals    Frequency    Min 1X/week      PT Plan      Co-evaluation              AM-PAC PT "6 Clicks" Mobility   Outcome Measure  Help needed turning from your back to your side while in a flat bed without using bedrails?: A Little Help needed moving from lying on your back to sitting on the side of a flat bed without using bedrails?: A Little Help needed moving to and from a bed to a chair (including a wheelchair)?: A  Little Help needed standing up from a chair using your arms (e.g., wheelchair or bedside chair)?: A Little Help needed to walk in hospital room?: A Little Help needed climbing 3-5 steps with a railing? : A Little 6 Click Score: 18    End of Session Equipment Utilized During Treatment: Gait belt (Shoulder Sling R UE) Activity Tolerance: Patient tolerated treatment well Patient left: in bed;with call bell/phone within reach;with family/visitor present   PT Visit Diagnosis: Unsteadiness on feet (R26.81);Other abnormalities of gait and mobility (R26.89);Difficulty in walking, not elsewhere classified (R26.2)     Time: 1829-9371 PT Time Calculation (min) (ACUTE ONLY): 15 min  Charges:    $Gait Training: 8-22 mins PT General Charges $$ ACUTE PT VISIT: 1 Visit                         Faye Ramsay, PT Acute Rehabilitation  Office: [phone]

## 2023-05-19 NOTE — TOC Initial Note (Signed)
Transition of Care [facility]) - Initial/Assessment Note    Patient Details  Name: Tracey [name] MRN: [id number] Date of Birth: March 06, 1946  [facility] CM/SW Contact:    Lanier Clam, RN Phone Number: 05/19/2023, 10:00 AM  Clinical Narrative:Patient agree to discuss plans w/spouse-Spoke to spouse about d/c plans-HHPT recc-prefers to cintinue w/[facility] from recent R shoulder arthroplasty for otpt PT-pleasantly decline HHPT. Has own transport home.                   Expected Discharge Plan: Home/Self Care Barriers to Discharge: Continued Medical Work up   Patient Goals and CMS Choice Patient states their goals for this hospitalization and ongoing recovery are:: Home CMS Medicare.gov Compare Post Acute Care list provided to:: Patient Represenative (must comment) Choice offered to / list presented to : Spouse  ownership interest in [facility].provided to:: Spouse    Expected Discharge Plan and Services   Discharge Planning Services: CM Consult Post Acute Care Choice: Resumption of Svcs/PTA Provider Living arrangements for the past 2 months: Single Family Home Expected Discharge Date: 05/19/23                         HH Arranged: Refused HH          Prior Living Arrangements/Services Living arrangements for the past 2 months: Single Family Home Lives with:: Spouse Patient language and need for interpreter reviewed:: Yes Do you feel safe going back to the place where you live?: Yes      Need for Family Participation in Patient Care: Yes (Comment) Care giver support system in place?: Yes (comment) Current home services: DME (cane) Criminal Activity/Legal Involvement Pertinent to Current Situation/Hospitalization: No - Comment as needed  Activities of Daily Living Home Assistive Devices/Equipment: Blood pressure cuff, Cane (specify quad or straight), Walker (specify type), Shower chair without back ADL Screening (condition at time of  admission) Independently performs ADLs?: No Does the patient have a NEW difficulty with bathing/dressing/toileting/self-feeding that is expected to last >3 days?: No Does the patient have a NEW difficulty with getting in/out of bed, walking, or climbing stairs that is expected to last >3 days?: No Does the patient have a NEW difficulty with communication that is expected to last >3 days?: No Is the patient deaf or have difficulty hearing?: No Does the patient have difficulty seeing, even when wearing glasses/contacts?: No Does the patient have difficulty concentrating, remembering, or making decisions?: No  Permission Sought/Granted Permission sought to share information with : Case Manager Permission granted to share information with : Yes, Verbal Permission Granted  Share Information with NAME: Case Manager           Emotional Assessment Appearance:: Appears stated age Attitude/Demeanor/Rapport: Gracious Affect (typically observed): Accepting Orientation: : Oriented to Self, Oriented to Place, Oriented to  Time Alcohol / Substance Use: Not Applicable Psych Involvement: No (comment)  Admission diagnosis:  Hypoxemia [R09.02] Acute hypoxemic respiratory failure (HCC) [J96.01] Pneumonia of right lower lobe due to infectious organism [J18.9] Patient Active Problem List   Diagnosis Date Noted   CAP (community acquired pneumonia) 05/18/2023   Hypokalemia 05/18/2023   Anemia 05/18/2023   Acute hypoxemic respiratory failure (HCC) 05/17/2023   S/P reverse total shoulder arthroplasty, right 05/13/2023   Fever of unknown origin 06/10/2022   Stage 3b chronic kidney disease (HCC) 06/10/2022   ARF (acute renal failure) (HCC) 12/30/2021   DOE (dyspnea on exertion) 12/29/2021   S/P total  knee arthroplasty, left 12/02/2021   Preoperative cardiovascular examination 11/18/2021   REM behavioral disorder 06/12/2021   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    CPAP (continuous positive airway pressure) dependence 01/20/2021   Unsteady gait when walking 01/20/2021   Complaint related to dreams 06/05/2019   Metabolic acidosis with increased anion gap and accumulation of organic acids 08/17/2018   Nausea, vomiting, and diarrhea 08/17/2018   AKI (acute kidney injury) (HCC) 08/17/2018   Overweight (BMI 25.0-29.9) 07/27/2018   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/27/2018   S/P right TKA 07/26/2018   S/P knee replacement 07/26/2018   UARS (upper airway resistance syndrome) 05/30/2018   Hyperlipidemia LDL goal <70 05/02/2018   Leg injury, right, initial encounter 01/06/2017   Contusion of right lower leg 01/06/2017   Hypoxemia 12/23/2015   Statin myopathy 05/20/2015   MCI (mild cognitive impairment) 05/20/2015   Edema 05/17/2015   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   Breast cancer genetic susceptibility 04/02/2015   OSA on CPAP 04/02/2015   Coronary artery disease involving native coronary artery with angina pectoris (HCC) 01/01/2014   Chest pain 01/01/2014   Essential hypertension    Ventricular tachycardia, non-sustained (HCC)    Breast cancer of upper-outer quadrant of right female breast (HCC) 06/28/2013   Postoperative visit 06/02/2013   Gout    Tachycardia    PCP:  Alysia Penna, MD Pharmacy:   [id number] [place], [place] - [address] AT [facility] [address] [place] [address] Phone: [phone] Fax: [phone]     Social Determinants of Health (SDOH) Social History: SDOH Screenings   Food Insecurity: Patient Declined (05/17/2023)  Housing: Patient Declined (05/17/2023)  Transportation Needs: Patient Declined (05/17/2023)  Utilities: Patient Declined (05/17/2023)  Tobacco Use: Medium Risk (05/16/2023)   SDOH Interventions:     Readmission Risk Interventions     No data to display

## 2023-05-20 ENCOUNTER — Inpatient Hospital Stay (HOSPITAL_COMMUNITY): Payer: Medicare Other

## 2023-05-20 DIAGNOSIS — R0603 Acute respiratory distress: Secondary | ICD-10-CM | POA: Diagnosis not present

## 2023-05-20 DIAGNOSIS — I10 Essential (primary) hypertension: Secondary | ICD-10-CM | POA: Diagnosis not present

## 2023-05-20 DIAGNOSIS — E876 Hypokalemia: Secondary | ICD-10-CM | POA: Diagnosis not present

## 2023-05-20 DIAGNOSIS — J9601 Acute respiratory failure with hypoxia: Secondary | ICD-10-CM | POA: Diagnosis not present

## 2023-05-20 DIAGNOSIS — G4733 Obstructive sleep apnea (adult) (pediatric): Secondary | ICD-10-CM | POA: Diagnosis not present

## 2023-05-20 LAB — LEGIONELLA PNEUMOPHILA SEROGP 1 UR AG: L. pneumophila Serogp 1 Ur Ag: NEGATIVE

## 2023-05-20 LAB — ECHOCARDIOGRAM COMPLETE
Area-P 1/2: 4.08 cm2
S' Lateral: 3.6 cm

## 2023-05-20 MED ORDER — BENZONATATE 200 MG PO CAPS: 200.0000 mg | ORAL_CAPSULE | Freq: Three times a day (TID) | ORAL | 0 refills | Status: DC | PRN

## 2023-05-20 MED ORDER — AMOXICILLIN-POT CLAVULANATE 875-125 MG PO TABS: 1.0000 | ORAL_TABLET | Freq: Two times a day (BID) | ORAL | 0 refills | Status: AC

## 2023-05-20 NOTE — Progress Notes (Signed)
Occupational Therapy Treatment Patient Details Name: Tracey [name] MRN: [id number] DOB: 1945-08-18 Today's Date: 05/20/2023   History of present illness 77 yo female presenting to therapy following hospital admission on 05/16/2023 due to PNA with acute hypoxemic respiratory failure. Pt underwent  R rTSA on 05/13/2023. Pt PMH includes but is not limited to: OA, CAD, hx of breast ca with b/l total mastectomy, GERD, gout, HTN hyperlipidemia, NSTEMI, tachycardia, R TKA 2019, and  L TKA on 12/02/21.   OT comments  Pt in bed upon therapy arrival and agreeable to participate in OT treatment session. Session focused on functional mobility, RUE HEP independence, and pt/family education regarding positioning techniques, pain and edema management techniques. Pt requested another print out of HEP for A/ROM elbow, hand, and wrist exercises. Completed during session with VC for form and technique as needed.  Pt has met 2/4 therapy goals this date      If plan is discharge home, recommend the following:  A little help with bathing/dressing/bathroom;Assist for transportation;Assistance with cooking/housework   Equipment Recommendations  None recommended by OT       Precautions / Restrictions Precautions Precautions: Shoulder Shoulder Interventions: Shoulder sling/immobilizer;Off for dressing/bathing/exercises;At all times Precaution Booklet Issued: No Precaution Comments: pt has precaution handout from shoulder surgery. Reviewed restrictions during therapy session. Required Braces or Orthoses: Sling Restrictions Weight Bearing Restrictions: Yes RUE Weight Bearing: Non weight bearing Other Position/Activity Restrictions: OK to use operative arm for feeding, hygiene and ADLs.   Ok to instruct Pendulums and lap slides as exercises. Ok to use operative arm within the following parameters for ADL purposes     New ROM (0/86)   Ok for PROM, AAROM, AROM within pain tolerance and within the following ROM   ER 20    ABD 45   FE 60       Mobility Bed Mobility Overal bed mobility: Needs Assistance Bed Mobility: Supine to Sit     Supine to sit: Supervision, HOB elevated          Transfers Overall transfer level: Needs assistance Equipment used: None Transfers: Sit to/from Stand, Bed to chair/wheelchair/BSC Sit to Stand: Supervision     Step pivot transfers: Supervision     General transfer comment: OT provided IV pole management     Balance Overall balance assessment: No apparent balance deficits (not formally assessed)         ADL either performed or assessed with clinical judgement   ADL       Grooming: Wash/dry hands;Supervision/safety;Standing        Toilet Transfer: Supervision/safety;Ambulation;Regular Toilet   Toileting- Architect and Hygiene: Supervision/safety;Sit to/from stand;Sitting/lateral lean                Cognition Arousal: Alert Behavior During Therapy: WFL for tasks assessed/performed Overall Cognitive Status: Within Functional Limits for tasks assessed         Exercises Shoulder Exercises Elbow Flexion: AAROM, Right, 10 reps, Seated Wrist Flexion: AROM, Right, 10 reps, Seated Wrist Extension: AROM, Right, Seated, 10 reps Digit Composite Flexion: AROM, Right, 10 reps, Seated Composite Extension: AROM, Right, 10 reps, Seated Other Exercises Other Exercises: Modified sling strap at velcro with cut hospital sock to prevent rubbing and irritation.    Shoulder Instructions Shoulder Instructions ROM for elbow, wrist and digits of operated UE: Supervision/safety     General Comments post op swelling present in right UE. Positioning techniques used while seated in recliner to elevate RUE while in sling. pt educated on technique and  verbalized understanding.    Pertinent Vitals/ Pain       Pain Assessment Pain Assessment: 0-10 Pain Score: 3  Pain Location: right upper trap/cervical region Pain Descriptors / Indicators:  Discomfort, Sore, Tightness Pain Intervention(s): Repositioned, Ice applied         Frequency  Min 1X/week        Progress Toward Goals  OT Goals(current goals can now be found in the care plan section)  Progress towards OT goals: Progressing toward goals (Pt has met 2/4 therapy goals this date)  ADL Goals Pt Will Transfer to Toilet:  (goal met 10/3) Pt Will Perform Toileting - Clothing Manipulation and hygiene:  (goal met 10/3)  Plan         AM-PAC OT "6 Clicks" Daily Activity     Outcome Measure   Help from another person eating meals?: A Little Help from another person taking care of personal grooming?: A Little Help from another person toileting, which includes using toliet, bedpan, or urinal?: A Little Help from another person bathing (including washing, rinsing, drying)?: A Little Help from another person to put on and taking off regular upper body clothing?: A Lot Help from another person to put on and taking off regular lower body clothing?: A Little 6 Click Score: 17    End of Session Equipment Utilized During Treatment: Gait belt  OT Visit Diagnosis: Muscle weakness (generalized) (M62.81);Pain Pain - Right/Left: Right Pain - part of body: Shoulder   Activity Tolerance Patient tolerated treatment well   Patient Left in chair;with call bell/phone within reach;with family/visitor present           Time: 1212-1254 OT Time Calculation (min): 42 min  Charges: OT Treatments $Self Care/Home Management : 8-22 mins $Therapeutic Activity: 8-22 mins $Therapeutic Exercise: 8-22 mins  Tracey [name], OTR/L,CBIS  Supplemental OT - [facility] and [facility] Secure Chat Preferred    Countess Biebel, Charisse March 05/20/2023, 3:31 PM

## 2023-05-20 NOTE — Plan of Care (Signed)
Pt alert and oriented x 4. Up ad lib in room. Pt moderate fall risk. Pt and husband refused bed alarm. Pt complaint of pain. Tramadol tylenol scheduled and given at hs. Pt reports effective at reducing pain.  VS stable. Lungs diminished. Pt cough and deep breathing. Cpap at night. Urinating without difficulty. BM  10/1. No prns given.  Problem: Education: Goal: Knowledge of the prescribed therapeutic regimen will improve Outcome: Progressing Goal: Understanding of activity limitations/precautions following surgery will improve Outcome: Progressing Goal: Individualized Educational Video(s) Outcome: Progressing   Problem: Activity: Goal: Ability to tolerate increased activity will improve Outcome: Progressing   Problem: Pain Management: Goal: Pain level will decrease with appropriate interventions Outcome: Progressing   Problem: Education: Goal: Knowledge of General Education information will improve Description: Including pain rating scale, medication(s)/side effects and non-pharmacologic comfort measures Outcome: Progressing   Problem: Health Behavior/Discharge Planning: Goal: Ability to manage health-related needs will improve Outcome: Progressing   Problem: Clinical Measurements: Goal: Ability to maintain clinical measurements within normal limits will improve Outcome: Progressing Goal: Will remain free from infection Outcome: Progressing Goal: Diagnostic test results will improve Outcome: Progressing Goal: Respiratory complications will improve Outcome: Progressing Goal: Cardiovascular complication will be avoided Outcome: Progressing   Problem: Activity: Goal: Risk for activity intolerance will decrease Outcome: Progressing   Problem: Nutrition: Goal: Adequate nutrition will be maintained Outcome: Progressing   Problem: Coping: Goal: Level of anxiety will decrease Outcome: Progressing   Problem: Elimination: Goal: Will not experience complications related to  bowel motility Outcome: Progressing Goal: Will not experience complications related to urinary retention Outcome: Progressing   Problem: Pain Managment: Goal: General experience of comfort will improve Outcome: Progressing   Problem: Safety: Goal: Ability to remain free from injury will improve Outcome: Progressing   Problem: Skin Integrity: Goal: Risk for impaired skin integrity will decrease Outcome: Progressing   Problem: Activity: Goal: Ability to tolerate increased activity will improve Outcome: Progressing   Problem: Clinical Measurements: Goal: Ability to maintain a body temperature in the normal range will improve Outcome: Progressing   Problem: Respiratory: Goal: Ability to maintain adequate ventilation will improve Outcome: Progressing Goal: Ability to maintain a clear airway will improve Outcome: Progressing   Problem: Education: Goal: Knowledge of General Education information will improve Description: Including pain rating scale, medication(s)/side effects and non-pharmacologic comfort measures Outcome: Progressing   Problem: Health Behavior/Discharge Planning: Goal: Ability to manage health-related needs will improve Outcome: Progressing   Problem: Clinical Measurements: Goal: Ability to maintain clinical measurements within normal limits will improve Outcome: Progressing Goal: Will remain free from infection Outcome: Progressing Goal: Diagnostic test results will improve Outcome: Progressing Goal: Respiratory complications will improve Outcome: Progressing Goal: Cardiovascular complication will be avoided Outcome: Progressing   Problem: Activity: Goal: Risk for activity intolerance will decrease Outcome: Progressing   Problem: Nutrition: Goal: Adequate nutrition will be maintained Outcome: Progressing   Problem: Coping: Goal: Level of anxiety will decrease Outcome: Progressing   Problem: Elimination: Goal: Will not experience  complications related to bowel motility Outcome: Progressing Goal: Will not experience complications related to urinary retention Outcome: Progressing   Problem: Pain Managment: Goal: General experience of comfort will improve Outcome: Progressing   Problem: Safety: Goal: Ability to remain free from injury will improve Outcome: Progressing   Problem: Skin Integrity: Goal: Risk for impaired skin integrity will decrease Outcome: Progressing

## 2023-05-21 NOTE — Discharge Summary (Signed)
Physician Discharge Summary   Patient: Tracey [name] MRN: [id number] DOB: 1946/03/19  Admit date:     05/16/2023  Discharge date: 05/20/2023  Discharge Physician: Kathlen Mody   PCP: Alysia Penna, MD   Recommendations at discharge:  Please follow up with PCP in one week.  Please follow up with repeat CXR in 2 to 4 weeks.   Discharge Diagnoses: Principal Problem:   Acute hypoxemic respiratory failure (HCC) Active Problems:   CAP (community acquired pneumonia)   Coronary artery disease involving native coronary artery with angina pectoris (HCC)   Essential hypertension   OSA on CPAP   Hyperlipidemia LDL goal <70   Hypokalemia   Anemia    Hospital Course: 77 year old female history of breast cancer, CAD on Plavix, GERD, hypertension, OSA, PMR on chronic prednisone presented to the ED with community-acquired pneumonia after right shoulder replacement. Patient notes that since shoulder replacement from 9/26 had been having gradual worsening onset of shortness of breath, low-grade fever at, productive cough of greenish sputum. Patient checked his sats at home noted to be in the 3s and presented to the ED.  CT angiogram chest done was negative for large central PE but with suboptimal contrast timing and concern for possible right-sided consolidation.  Patient admitted, placed empirically on IV Rocephin and azithromycin.   Assessment and Plan: Acute hypoxic respiratory failure secondary to community-acquired pneumonia CT angiogram of the chest is negative for PE Lower extremity Dopplers negative for DVT Started on empiric IV antibiotics, transitioned to oral antibiotics.  Negative blood cultures so far Patient reports shortness of breath on bending forward and relief when laying on the back, obtain echocardiogram to rule out pericardial effusion. Echo does not show any pericardial effusion.   Continue with Flonase, Mucinex, Claritin, Dulera and DuoNebs       S/p recent right  shoulder arthroplasty Pain control outpatient follow-up with orthopedics       History of polymyalgia rheumatica Resume home dose of steroids.       Hypertension Blood pressure parameters are optimal     Hypokalemia Replaced     Obstructive sleep apnea On CPAP at night       Anemia of chronic disease With a component of postop anemia Transfuse to keep hemoglobin greater than 8.      Left upper swelling and tenderness with bruising, From IV line  Neg  venous duplex  for DVT.    Estimated body mass index is 27.07 kg/m as calculated from the following:   Height as of 05/13/23: 5\' 2"  (1.575 m).   Weight as of 05/13/23: 67.1 kg.  Consultants: none.  Procedures performed: none.   Disposition: Home Diet recommendation:  Discharge Diet Orders (From admission, onward)     Start     Ordered   05/20/23 0000  Diet - low sodium heart healthy        05/20/23 1714           Regular diet DISCHARGE MEDICATION: Allergies as of 05/20/2023       Reactions   Pseudoeph-hydrocodone-gg Other (See Comments)   hallucinations   Ivp Dye [iodinated Contrast Media] Hives   Oxycodone Other (See Comments)   hallucinations   Sulfa Antibiotics Swelling   Tape Other (See Comments)    tears skin ; ok to use paper tape         Medication List     STOP taking these medications    budesonide-formoterol 160-4.5 MCG/ACT inhaler Commonly known as: Symbicort  Patient Name:  Tracey [name]  Date of Exam:   05/17/2023 Medical Rec #: [id number]       Accession #:    [id number] Date of Birth: [birth year]       Patient Gender: F Patient Age:   50 years Exam Location:  [facility] Procedure:      VAS [place] LOWER EXTREMITY VENOUS (DVT) Referring Phys: MIR [facility]  --------------------------------------------------------------------------------  Indications: Acute hypoxic respiratory failure.  Comparison Study: Previous RLEV on 12/12/2021 & LLEV on 08/17/2018 were negative                   for DVT. Performing Technologist: Ernestene Mention RVT, RDMS  Examination Guidelines: A complete evaluation includes B-mode imaging, spectral Doppler, color Doppler, and power Doppler as needed of all accessible portions of each vessel. Bilateral testing is considered an integral part of a complete examination. Limited examinations for reoccurring indications may be performed as noted. The reflux portion of the exam is performed with the patient in reverse Trendelenburg.  +---------+---------------+---------+-----------+----------+--------------+ RIGHT    CompressibilityPhasicitySpontaneityPropertiesThrombus Aging +---------+---------------+---------+-----------+----------+--------------+ CFV      Full           Yes      Yes                                 +---------+---------------+---------+-----------+----------+--------------+ SFJ      Full                                                        +---------+---------------+---------+-----------+----------+--------------+ FV Prox  Full           Yes      Yes                                 +---------+---------------+---------+-----------+----------+--------------+ FV Mid   Full           Yes      Yes                                 +---------+---------------+---------+-----------+----------+--------------+ FV DistalFull           Yes      Yes                                 +---------+---------------+---------+-----------+----------+--------------+ PFV      Full                                                        +---------+---------------+---------+-----------+----------+--------------+ POP      Full           Yes      Yes                                  +---------+---------------+---------+-----------+----------+--------------+ PTV      Full                                                        +---------+---------------+---------+-----------+----------+--------------+  focal neurological deficits. Extremities: Symmetric 5 x 5 power. Skin: No rashes,  Psychiatry: Mood & affect appropriate.    Condition at discharge: fair  The results of significant diagnostics from this hospitalization (including imaging, microbiology, ancillary and laboratory) are listed below for reference.   Imaging Studies: ECHOCARDIOGRAM COMPLETE  Result Date: 05/20/2023    ECHOCARDIOGRAM REPORT   Patient Name:   Tracey [name] Date of Exam: 05/20/2023 Medical Rec #:  [id number]      Height:       62.0 in Accession #:    [id number]     Weight:       148.0 lb Date of Birth:  1945-09-14      BSA:          1.682 m Patient Age:    41 years       BP:           142/64 mmHg Patient Gender: F              HR:           63 bpm. Exam Location:  Inpatient Procedure: 2D Echo, Cardiac Doppler and Color Doppler Indications:    Dyspnea R06.00  History:        Patient has prior history of Echocardiogram examinations, most                 recent 06/30/2022.  Sonographer:    Harriette Bouillon RDCS Referring Phys: Herminio Heads  IMPRESSIONS  1. Left ventricular ejection fraction, by estimation, is 55 to 60%. The left ventricle has normal function. The left ventricle has no regional wall motion abnormalities. Left ventricular diastolic parameters were normal.  2. Right ventricular systolic function is normal. The right ventricular size is normal.  3. The mitral valve is normal in structure. Trivial mitral valve regurgitation. No evidence of mitral stenosis.  4. The aortic valve is normal in structure. Aortic valve regurgitation is not visualized. No aortic stenosis is present.  5. The inferior vena cava is normal in size with greater than 50% respiratory variability, suggesting right atrial pressure of 3 mmHg. FINDINGS  Left Ventricle: Left ventricular ejection fraction, by estimation, is 55 to 60%. The left ventricle has normal function. The left ventricle has no regional wall motion abnormalities. The left ventricular internal cavity size was normal in size. There is  no left ventricular hypertrophy. Left ventricular diastolic parameters were normal. Right Ventricle: The right ventricular size is normal. No increase in right ventricular wall thickness. Right ventricular systolic function is normal. Left Atrium: Left atrial size was normal in size. Right Atrium: Right atrial size was normal in size. Pericardium: There is no evidence of pericardial effusion. Mitral Valve: The mitral valve is normal in structure. Trivial mitral valve regurgitation. No evidence of mitral valve stenosis. Tricuspid Valve: The tricuspid valve is normal in structure. Tricuspid valve regurgitation is trivial. No evidence of tricuspid stenosis. Aortic Valve: The aortic valve is normal in structure. Aortic valve regurgitation is not visualized. No aortic stenosis is present. Pulmonic Valve: The pulmonic valve was not well visualized. Pulmonic valve regurgitation is not visualized. No evidence of pulmonic stenosis. Aorta: The aortic root is normal in size and  structure. Venous: The inferior vena cava is normal in size with greater than 50% respiratory variability, suggesting right atrial pressure of 3 mmHg. IAS/Shunts: No atrial level shunt detected by color flow Doppler.  LEFT VENTRICLE PLAX 2D LVIDd:         4.50 cm  Physician Discharge Summary   Patient: Tracey [name] MRN: [id number] DOB: 1946/03/19  Admit date:     05/16/2023  Discharge date: 05/20/2023  Discharge Physician: Kathlen Mody   PCP: Alysia Penna, MD   Recommendations at discharge:  Please follow up with PCP in one week.  Please follow up with repeat CXR in 2 to 4 weeks.   Discharge Diagnoses: Principal Problem:   Acute hypoxemic respiratory failure (HCC) Active Problems:   CAP (community acquired pneumonia)   Coronary artery disease involving native coronary artery with angina pectoris (HCC)   Essential hypertension   OSA on CPAP   Hyperlipidemia LDL goal <70   Hypokalemia   Anemia    Hospital Course: 77 year old female history of breast cancer, CAD on Plavix, GERD, hypertension, OSA, PMR on chronic prednisone presented to the ED with community-acquired pneumonia after right shoulder replacement. Patient notes that since shoulder replacement from 9/26 had been having gradual worsening onset of shortness of breath, low-grade fever at, productive cough of greenish sputum. Patient checked his sats at home noted to be in the 3s and presented to the ED.  CT angiogram chest done was negative for large central PE but with suboptimal contrast timing and concern for possible right-sided consolidation.  Patient admitted, placed empirically on IV Rocephin and azithromycin.   Assessment and Plan: Acute hypoxic respiratory failure secondary to community-acquired pneumonia CT angiogram of the chest is negative for PE Lower extremity Dopplers negative for DVT Started on empiric IV antibiotics, transitioned to oral antibiotics.  Negative blood cultures so far Patient reports shortness of breath on bending forward and relief when laying on the back, obtain echocardiogram to rule out pericardial effusion. Echo does not show any pericardial effusion.   Continue with Flonase, Mucinex, Claritin, Dulera and DuoNebs       S/p recent right  shoulder arthroplasty Pain control outpatient follow-up with orthopedics       History of polymyalgia rheumatica Resume home dose of steroids.       Hypertension Blood pressure parameters are optimal     Hypokalemia Replaced     Obstructive sleep apnea On CPAP at night       Anemia of chronic disease With a component of postop anemia Transfuse to keep hemoglobin greater than 8.      Left upper swelling and tenderness with bruising, From IV line  Neg  venous duplex  for DVT.    Estimated body mass index is 27.07 kg/m as calculated from the following:   Height as of 05/13/23: 5\' 2"  (1.575 m).   Weight as of 05/13/23: 67.1 kg.  Consultants: none.  Procedures performed: none.   Disposition: Home Diet recommendation:  Discharge Diet Orders (From admission, onward)     Start     Ordered   05/20/23 0000  Diet - low sodium heart healthy        05/20/23 1714           Regular diet DISCHARGE MEDICATION: Allergies as of 05/20/2023       Reactions   Pseudoeph-hydrocodone-gg Other (See Comments)   hallucinations   Ivp Dye [iodinated Contrast Media] Hives   Oxycodone Other (See Comments)   hallucinations   Sulfa Antibiotics Swelling   Tape Other (See Comments)    tears skin ; ok to use paper tape         Medication List     STOP taking these medications    budesonide-formoterol 160-4.5 MCG/ACT inhaler Commonly known as: Symbicort  Patient Name:  Tracey [name]  Date of Exam:   05/17/2023 Medical Rec #: [id number]       Accession #:    [id number] Date of Birth: [birth year]       Patient Gender: F Patient Age:   50 years Exam Location:  [facility] Procedure:      VAS [place] LOWER EXTREMITY VENOUS (DVT) Referring Phys: MIR [facility]  --------------------------------------------------------------------------------  Indications: Acute hypoxic respiratory failure.  Comparison Study: Previous RLEV on 12/12/2021 & LLEV on 08/17/2018 were negative                   for DVT. Performing Technologist: Ernestene Mention RVT, RDMS  Examination Guidelines: A complete evaluation includes B-mode imaging, spectral Doppler, color Doppler, and power Doppler as needed of all accessible portions of each vessel. Bilateral testing is considered an integral part of a complete examination. Limited examinations for reoccurring indications may be performed as noted. The reflux portion of the exam is performed with the patient in reverse Trendelenburg.  +---------+---------------+---------+-----------+----------+--------------+ RIGHT    CompressibilityPhasicitySpontaneityPropertiesThrombus Aging +---------+---------------+---------+-----------+----------+--------------+ CFV      Full           Yes      Yes                                 +---------+---------------+---------+-----------+----------+--------------+ SFJ      Full                                                        +---------+---------------+---------+-----------+----------+--------------+ FV Prox  Full           Yes      Yes                                 +---------+---------------+---------+-----------+----------+--------------+ FV Mid   Full           Yes      Yes                                 +---------+---------------+---------+-----------+----------+--------------+ FV DistalFull           Yes      Yes                                 +---------+---------------+---------+-----------+----------+--------------+ PFV      Full                                                        +---------+---------------+---------+-----------+----------+--------------+ POP      Full           Yes      Yes                                  +---------+---------------+---------+-----------+----------+--------------+ PTV      Full                                                        +---------+---------------+---------+-----------+----------+--------------+  focal neurological deficits. Extremities: Symmetric 5 x 5 power. Skin: No rashes,  Psychiatry: Mood & affect appropriate.    Condition at discharge: fair  The results of significant diagnostics from this hospitalization (including imaging, microbiology, ancillary and laboratory) are listed below for reference.   Imaging Studies: ECHOCARDIOGRAM COMPLETE  Result Date: 05/20/2023    ECHOCARDIOGRAM REPORT   Patient Name:   Tracey [name] Date of Exam: 05/20/2023 Medical Rec #:  [id number]      Height:       62.0 in Accession #:    [id number]     Weight:       148.0 lb Date of Birth:  1945-09-14      BSA:          1.682 m Patient Age:    41 years       BP:           142/64 mmHg Patient Gender: F              HR:           63 bpm. Exam Location:  Inpatient Procedure: 2D Echo, Cardiac Doppler and Color Doppler Indications:    Dyspnea R06.00  History:        Patient has prior history of Echocardiogram examinations, most                 recent 06/30/2022.  Sonographer:    Harriette Bouillon RDCS Referring Phys: Herminio Heads  IMPRESSIONS  1. Left ventricular ejection fraction, by estimation, is 55 to 60%. The left ventricle has normal function. The left ventricle has no regional wall motion abnormalities. Left ventricular diastolic parameters were normal.  2. Right ventricular systolic function is normal. The right ventricular size is normal.  3. The mitral valve is normal in structure. Trivial mitral valve regurgitation. No evidence of mitral stenosis.  4. The aortic valve is normal in structure. Aortic valve regurgitation is not visualized. No aortic stenosis is present.  5. The inferior vena cava is normal in size with greater than 50% respiratory variability, suggesting right atrial pressure of 3 mmHg. FINDINGS  Left Ventricle: Left ventricular ejection fraction, by estimation, is 55 to 60%. The left ventricle has normal function. The left ventricle has no regional wall motion abnormalities. The left ventricular internal cavity size was normal in size. There is  no left ventricular hypertrophy. Left ventricular diastolic parameters were normal. Right Ventricle: The right ventricular size is normal. No increase in right ventricular wall thickness. Right ventricular systolic function is normal. Left Atrium: Left atrial size was normal in size. Right Atrium: Right atrial size was normal in size. Pericardium: There is no evidence of pericardial effusion. Mitral Valve: The mitral valve is normal in structure. Trivial mitral valve regurgitation. No evidence of mitral valve stenosis. Tricuspid Valve: The tricuspid valve is normal in structure. Tricuspid valve regurgitation is trivial. No evidence of tricuspid stenosis. Aortic Valve: The aortic valve is normal in structure. Aortic valve regurgitation is not visualized. No aortic stenosis is present. Pulmonic Valve: The pulmonic valve was not well visualized. Pulmonic valve regurgitation is not visualized. No evidence of pulmonic stenosis. Aorta: The aortic root is normal in size and  structure. Venous: The inferior vena cava is normal in size with greater than 50% respiratory variability, suggesting right atrial pressure of 3 mmHg. IAS/Shunts: No atrial level shunt detected by color flow Doppler.  LEFT VENTRICLE PLAX 2D LVIDd:         4.50 cm  Physician Discharge Summary   Patient: Tracey [name] MRN: [id number] DOB: 1946/03/19  Admit date:     05/16/2023  Discharge date: 05/20/2023  Discharge Physician: Kathlen Mody   PCP: Alysia Penna, MD   Recommendations at discharge:  Please follow up with PCP in one week.  Please follow up with repeat CXR in 2 to 4 weeks.   Discharge Diagnoses: Principal Problem:   Acute hypoxemic respiratory failure (HCC) Active Problems:   CAP (community acquired pneumonia)   Coronary artery disease involving native coronary artery with angina pectoris (HCC)   Essential hypertension   OSA on CPAP   Hyperlipidemia LDL goal <70   Hypokalemia   Anemia    Hospital Course: 77 year old female history of breast cancer, CAD on Plavix, GERD, hypertension, OSA, PMR on chronic prednisone presented to the ED with community-acquired pneumonia after right shoulder replacement. Patient notes that since shoulder replacement from 9/26 had been having gradual worsening onset of shortness of breath, low-grade fever at, productive cough of greenish sputum. Patient checked his sats at home noted to be in the 3s and presented to the ED.  CT angiogram chest done was negative for large central PE but with suboptimal contrast timing and concern for possible right-sided consolidation.  Patient admitted, placed empirically on IV Rocephin and azithromycin.   Assessment and Plan: Acute hypoxic respiratory failure secondary to community-acquired pneumonia CT angiogram of the chest is negative for PE Lower extremity Dopplers negative for DVT Started on empiric IV antibiotics, transitioned to oral antibiotics.  Negative blood cultures so far Patient reports shortness of breath on bending forward and relief when laying on the back, obtain echocardiogram to rule out pericardial effusion. Echo does not show any pericardial effusion.   Continue with Flonase, Mucinex, Claritin, Dulera and DuoNebs       S/p recent right  shoulder arthroplasty Pain control outpatient follow-up with orthopedics       History of polymyalgia rheumatica Resume home dose of steroids.       Hypertension Blood pressure parameters are optimal     Hypokalemia Replaced     Obstructive sleep apnea On CPAP at night       Anemia of chronic disease With a component of postop anemia Transfuse to keep hemoglobin greater than 8.      Left upper swelling and tenderness with bruising, From IV line  Neg  venous duplex  for DVT.    Estimated body mass index is 27.07 kg/m as calculated from the following:   Height as of 05/13/23: 5\' 2"  (1.575 m).   Weight as of 05/13/23: 67.1 kg.  Consultants: none.  Procedures performed: none.   Disposition: Home Diet recommendation:  Discharge Diet Orders (From admission, onward)     Start     Ordered   05/20/23 0000  Diet - low sodium heart healthy        05/20/23 1714           Regular diet DISCHARGE MEDICATION: Allergies as of 05/20/2023       Reactions   Pseudoeph-hydrocodone-gg Other (See Comments)   hallucinations   Ivp Dye [iodinated Contrast Media] Hives   Oxycodone Other (See Comments)   hallucinations   Sulfa Antibiotics Swelling   Tape Other (See Comments)    tears skin ; ok to use paper tape         Medication List     STOP taking these medications    budesonide-formoterol 160-4.5 MCG/ACT inhaler Commonly known as: Symbicort  Patient Name:  Tracey [name]  Date of Exam:   05/17/2023 Medical Rec #: [id number]       Accession #:    [id number] Date of Birth: [birth year]       Patient Gender: F Patient Age:   50 years Exam Location:  [facility] Procedure:      VAS [place] LOWER EXTREMITY VENOUS (DVT) Referring Phys: MIR [facility]  --------------------------------------------------------------------------------  Indications: Acute hypoxic respiratory failure.  Comparison Study: Previous RLEV on 12/12/2021 & LLEV on 08/17/2018 were negative                   for DVT. Performing Technologist: Ernestene Mention RVT, RDMS  Examination Guidelines: A complete evaluation includes B-mode imaging, spectral Doppler, color Doppler, and power Doppler as needed of all accessible portions of each vessel. Bilateral testing is considered an integral part of a complete examination. Limited examinations for reoccurring indications may be performed as noted. The reflux portion of the exam is performed with the patient in reverse Trendelenburg.  +---------+---------------+---------+-----------+----------+--------------+ RIGHT    CompressibilityPhasicitySpontaneityPropertiesThrombus Aging +---------+---------------+---------+-----------+----------+--------------+ CFV      Full           Yes      Yes                                 +---------+---------------+---------+-----------+----------+--------------+ SFJ      Full                                                        +---------+---------------+---------+-----------+----------+--------------+ FV Prox  Full           Yes      Yes                                 +---------+---------------+---------+-----------+----------+--------------+ FV Mid   Full           Yes      Yes                                 +---------+---------------+---------+-----------+----------+--------------+ FV DistalFull           Yes      Yes                                 +---------+---------------+---------+-----------+----------+--------------+ PFV      Full                                                        +---------+---------------+---------+-----------+----------+--------------+ POP      Full           Yes      Yes                                  +---------+---------------+---------+-----------+----------+--------------+ PTV      Full                                                        +---------+---------------+---------+-----------+----------+--------------+  focal neurological deficits. Extremities: Symmetric 5 x 5 power. Skin: No rashes,  Psychiatry: Mood & affect appropriate.    Condition at discharge: fair  The results of significant diagnostics from this hospitalization (including imaging, microbiology, ancillary and laboratory) are listed below for reference.   Imaging Studies: ECHOCARDIOGRAM COMPLETE  Result Date: 05/20/2023    ECHOCARDIOGRAM REPORT   Patient Name:   Tracey [name] Date of Exam: 05/20/2023 Medical Rec #:  [id number]      Height:       62.0 in Accession #:    [id number]     Weight:       148.0 lb Date of Birth:  1945-09-14      BSA:          1.682 m Patient Age:    41 years       BP:           142/64 mmHg Patient Gender: F              HR:           63 bpm. Exam Location:  Inpatient Procedure: 2D Echo, Cardiac Doppler and Color Doppler Indications:    Dyspnea R06.00  History:        Patient has prior history of Echocardiogram examinations, most                 recent 06/30/2022.  Sonographer:    Harriette Bouillon RDCS Referring Phys: Herminio Heads  IMPRESSIONS  1. Left ventricular ejection fraction, by estimation, is 55 to 60%. The left ventricle has normal function. The left ventricle has no regional wall motion abnormalities. Left ventricular diastolic parameters were normal.  2. Right ventricular systolic function is normal. The right ventricular size is normal.  3. The mitral valve is normal in structure. Trivial mitral valve regurgitation. No evidence of mitral stenosis.  4. The aortic valve is normal in structure. Aortic valve regurgitation is not visualized. No aortic stenosis is present.  5. The inferior vena cava is normal in size with greater than 50% respiratory variability, suggesting right atrial pressure of 3 mmHg. FINDINGS  Left Ventricle: Left ventricular ejection fraction, by estimation, is 55 to 60%. The left ventricle has normal function. The left ventricle has no regional wall motion abnormalities. The left ventricular internal cavity size was normal in size. There is  no left ventricular hypertrophy. Left ventricular diastolic parameters were normal. Right Ventricle: The right ventricular size is normal. No increase in right ventricular wall thickness. Right ventricular systolic function is normal. Left Atrium: Left atrial size was normal in size. Right Atrium: Right atrial size was normal in size. Pericardium: There is no evidence of pericardial effusion. Mitral Valve: The mitral valve is normal in structure. Trivial mitral valve regurgitation. No evidence of mitral valve stenosis. Tricuspid Valve: The tricuspid valve is normal in structure. Tricuspid valve regurgitation is trivial. No evidence of tricuspid stenosis. Aortic Valve: The aortic valve is normal in structure. Aortic valve regurgitation is not visualized. No aortic stenosis is present. Pulmonic Valve: The pulmonic valve was not well visualized. Pulmonic valve regurgitation is not visualized. No evidence of pulmonic stenosis. Aorta: The aortic root is normal in size and  structure. Venous: The inferior vena cava is normal in size with greater than 50% respiratory variability, suggesting right atrial pressure of 3 mmHg. IAS/Shunts: No atrial level shunt detected by color flow Doppler.  LEFT VENTRICLE PLAX 2D LVIDd:         4.50 cm  focal neurological deficits. Extremities: Symmetric 5 x 5 power. Skin: No rashes,  Psychiatry: Mood & affect appropriate.    Condition at discharge: fair  The results of significant diagnostics from this hospitalization (including imaging, microbiology, ancillary and laboratory) are listed below for reference.   Imaging Studies: ECHOCARDIOGRAM COMPLETE  Result Date: 05/20/2023    ECHOCARDIOGRAM REPORT   Patient Name:   Tracey [name] Date of Exam: 05/20/2023 Medical Rec #:  [id number]      Height:       62.0 in Accession #:    [id number]     Weight:       148.0 lb Date of Birth:  1945-09-14      BSA:          1.682 m Patient Age:    41 years       BP:           142/64 mmHg Patient Gender: F              HR:           63 bpm. Exam Location:  Inpatient Procedure: 2D Echo, Cardiac Doppler and Color Doppler Indications:    Dyspnea R06.00  History:        Patient has prior history of Echocardiogram examinations, most                 recent 06/30/2022.  Sonographer:    Harriette Bouillon RDCS Referring Phys: Herminio Heads  IMPRESSIONS  1. Left ventricular ejection fraction, by estimation, is 55 to 60%. The left ventricle has normal function. The left ventricle has no regional wall motion abnormalities. Left ventricular diastolic parameters were normal.  2. Right ventricular systolic function is normal. The right ventricular size is normal.  3. The mitral valve is normal in structure. Trivial mitral valve regurgitation. No evidence of mitral stenosis.  4. The aortic valve is normal in structure. Aortic valve regurgitation is not visualized. No aortic stenosis is present.  5. The inferior vena cava is normal in size with greater than 50% respiratory variability, suggesting right atrial pressure of 3 mmHg. FINDINGS  Left Ventricle: Left ventricular ejection fraction, by estimation, is 55 to 60%. The left ventricle has normal function. The left ventricle has no regional wall motion abnormalities. The left ventricular internal cavity size was normal in size. There is  no left ventricular hypertrophy. Left ventricular diastolic parameters were normal. Right Ventricle: The right ventricular size is normal. No increase in right ventricular wall thickness. Right ventricular systolic function is normal. Left Atrium: Left atrial size was normal in size. Right Atrium: Right atrial size was normal in size. Pericardium: There is no evidence of pericardial effusion. Mitral Valve: The mitral valve is normal in structure. Trivial mitral valve regurgitation. No evidence of mitral valve stenosis. Tricuspid Valve: The tricuspid valve is normal in structure. Tricuspid valve regurgitation is trivial. No evidence of tricuspid stenosis. Aortic Valve: The aortic valve is normal in structure. Aortic valve regurgitation is not visualized. No aortic stenosis is present. Pulmonic Valve: The pulmonic valve was not well visualized. Pulmonic valve regurgitation is not visualized. No evidence of pulmonic stenosis. Aorta: The aortic root is normal in size and  structure. Venous: The inferior vena cava is normal in size with greater than 50% respiratory variability, suggesting right atrial pressure of 3 mmHg. IAS/Shunts: No atrial level shunt detected by color flow Doppler.  LEFT VENTRICLE PLAX 2D LVIDd:         4.50 cm

## 2023-05-22 LAB — CULTURE, BLOOD (ROUTINE X 2)
Culture: NO GROWTH
Culture: NO GROWTH
Special Requests: ADEQUATE
Special Requests: ADEQUATE

## 2023-05-26 DIAGNOSIS — Z471 Aftercare following joint replacement surgery: Secondary | ICD-10-CM | POA: Diagnosis not present

## 2023-05-26 DIAGNOSIS — Z96611 Presence of right artificial shoulder joint: Secondary | ICD-10-CM | POA: Diagnosis not present

## 2023-05-30 ENCOUNTER — Emergency Department (HOSPITAL_COMMUNITY)
Admission: EM | Admit: 2023-05-30 | Discharge: 2023-05-31 | Disposition: A | Discharge status: Home / Self Care | Payer: Medicare Other | Attending: Emergency Medicine | Admitting: Emergency Medicine

## 2023-05-30 ENCOUNTER — Encounter (HOSPITAL_COMMUNITY): Payer: Self-pay

## 2023-05-30 ENCOUNTER — Other Ambulatory Visit: Payer: Self-pay

## 2023-05-30 DIAGNOSIS — S61432A Puncture wound without foreign body of left hand, initial encounter: Secondary | ICD-10-CM | POA: Diagnosis not present

## 2023-05-30 DIAGNOSIS — Z203 Contact with and (suspected) exposure to rabies: Secondary | ICD-10-CM | POA: Diagnosis not present

## 2023-05-30 DIAGNOSIS — Z7982 Long term (current) use of aspirin: Secondary | ICD-10-CM | POA: Diagnosis not present

## 2023-05-30 DIAGNOSIS — Y92007 Garden or yard of unspecified non-institutional (private) residence as the place of occurrence of the external cause: Secondary | ICD-10-CM | POA: Diagnosis not present

## 2023-05-30 DIAGNOSIS — W5581XA Bitten by other mammals, initial encounter: Secondary | ICD-10-CM | POA: Diagnosis not present

## 2023-05-30 DIAGNOSIS — S61452A Open bite of left hand, initial encounter: Secondary | ICD-10-CM | POA: Diagnosis not present

## 2023-05-30 DIAGNOSIS — Z2914 Encounter for prophylactic rabies immune globin: Secondary | ICD-10-CM | POA: Diagnosis not present

## 2023-05-30 DIAGNOSIS — Z23 Encounter for immunization: Secondary | ICD-10-CM | POA: Insufficient documentation

## 2023-05-30 DIAGNOSIS — Z7902 Long term (current) use of antithrombotics/antiplatelets: Secondary | ICD-10-CM | POA: Insufficient documentation

## 2023-05-30 NOTE — ED Triage Notes (Signed)
Pt was sitting around her pool about an hour and a half ago and something flew by her left thumb and bit her. There are 2 parallel punctures to the left proximal knuckle of her thumb. She said she did see bats flying around out side. Does not know if that was what bit her or not.

## 2023-05-31 DIAGNOSIS — S61432A Puncture wound without foreign body of left hand, initial encounter: Secondary | ICD-10-CM | POA: Diagnosis not present

## 2023-05-31 MED ORDER — RABIES VACCINE, PCEC IM SUSR
1.0000 mL | Freq: Once | INTRAMUSCULAR | Status: AC
  Administered 2023-05-31: 1 mL via INTRAMUSCULAR
  Filled 2023-05-31: qty 1

## 2023-05-31 MED ORDER — BACITRACIN ZINC 500 UNIT/GM EX OINT: TOPICAL_OINTMENT | Freq: Two times a day (BID) | CUTANEOUS | Status: DC

## 2023-05-31 MED ORDER — RABIES IMMUNE GLOBULIN 150 UNIT/ML IM INJ
20.0000 [IU]/kg | INJECTION | Freq: Once | INTRAMUSCULAR | Status: AC
  Administered 2023-05-31: 1350 [IU] via INTRAMUSCULAR
  Filled 2023-05-31: qty 10

## 2023-05-31 NOTE — ED Provider Notes (Signed)
[place] EMERGENCY DEPARTMENT AT [facility] Provider Note   CSN: [id number] Arrival date & time: 05/30/23  2156     History  Chief Complaint  Patient presents with   Insect Bite    Tracey [name] is a 77 y.o. female who presents with concern for bat bite.  Patient states that she was sitting outside around her pool with her daughter, there are some bats that live in the trees near their yard.  States that they were flying around and 1 flew down towards her; in the process of her reaction to it flying towards her, of the back made contact with her hand and bit her.  2 puncture wounds to the hand.  Dorsal aspect of the thenar eminence.  No other acute concerns.  Patient is 2 weeks status post right shoulder replacement with subsequent hospitalization for pneumonia.  Not currently on any antibiotics.   HPI     Home Medications Prior to Admission medications   Medication Sig Start Date End Date Taking? Authorizing Provider  allopurinol (ZYLOPRIM) 100 MG tablet TAKE 1 TABLET BY MOUTH ONCE DAILY 06/25/16   Magrinat, Valentino Hue, MD  ASPIRIN LOW DOSE 81 MG chewable tablet Chew 81 mg by mouth daily.  09/19/17   [provider]  benzonatate (TESSALON) 200 MG capsule Take 1 capsule (200 mg total) by mouth 3 (three) times daily as needed for cough. 05/20/23   Kathlen Mody, MD  Budeson-Glycopyrrol-Formoterol (BREZTRI AEROSPHERE) 160-9-4.8 MCG/ACT AERO Inhale 2 puffs into the lungs in the morning and at bedtime. Patient taking differently: Inhale 2 puffs into the lungs as needed (wheezing/SOB). 06/19/22   Nyoka Cowden, MD  Cholecalciferol (VITAMIN D3) 50 MCG (2000 UT) TABS Take 2,000 Units by mouth daily.    [provider]  clopidogrel (PLAVIX) 75 MG tablet TAKE 1 TABLET(75 MG) BY MOUTH DAILY 03/17/23   Tonny Bollman, MD  cyanocobalamin (,VITAMIN B-12,) 1000 MCG/ML injection Inject 1,000 mcg into the muscle every 30 (thirty) days.    [provider]   ezetimibe (ZETIA) 10 MG tablet TAKE 1 TABLET(10 MG) BY MOUTH DAILY 03/17/23   Tonny Bollman, MD  fluticasone [facility]) 50 MCG/ACT nasal spray Place 2 sprays into both nostrils daily. 01/18/13   [provider]  folic acid (FOLVITE) 1 MG tablet Take 1 mg by mouth daily.    [provider]  furosemide (LASIX) 40 MG tablet Take 40 mg by mouth daily as needed for fluid.  04/01/13   [provider]  hydroxychloroquine (PLAQUENIL) 200 MG tablet Take 300 mg by mouth daily.    [provider]  loratadine (CLARITIN) 10 MG tablet Take 10 mg by mouth daily.    [provider]  metoprolol succinate (TOPROL-XL) 25 MG 24 hr tablet TAKE 1 TABLET(25 MG) BY MOUTH DAILY 06/16/22   Tonny Bollman, MD  Multiple Vitamin (MULTI-VITAMIN) tablet Take 1 tablet by mouth daily.    [provider]  nitroGLYCERIN (NITROSTAT) 0.4 MG SL tablet Place 1 tablet (0.4 mg total) under the tongue every 5 (five) minutes as needed for chest pain. 06/02/21   Alver Sorrow, NP  pantoprazole (PROTONIX) 40 MG tablet TAKE 1 TABLET(40 MG) BY MOUTH DAILY 05/12/23   Tonny Bollman, MD  polyethylene glycol (MIRALAX / GLYCOLAX) 17 g packet Take 17 g by mouth daily as needed for mild constipation. 12/03/21   Cassandria Anger, PA-C  predniSONE (DELTASONE) 1 MG tablet Take 4 mg by mouth daily. 05/21/22  [provider]  predniSONE (DELTASONE) 5 MG tablet Take 5 mg by mouth daily with breakfast.    [provider]  Propylene Glycol (SYSTANE COMPLETE) 0.6 % SOLN Place 2 drops into both eyes 2 (two) times daily.    [provider]  rosuvastatin (CRESTOR) 20 MG tablet Take 1 tablet (20 mg total) by mouth daily. 12/07/22   Tonny Bollman, MD  sertraline (ZOLOFT) 100 MG tablet Take 50 mg by mouth daily. 12/06/20   [provider]  testosterone cypionate (DEPOTESTOSTERONE CYPIONATE) 200 MG/ML injection Inject 200 mg into the muscle every 30 (thirty) days. 01/17/19    [provider]  traMADol (ULTRAM) 50 MG tablet Take 100 mg by mouth 2 (two) times daily.    [provider]      Allergies    Pseudoeph-hydrocodone-gg, Ivp dye [iodinated contrast media], Oxycodone, Sulfa antibiotics, and Tape    Review of Systems   Review of Systems  Skin:        Animal bite    Physical Exam Updated Vital Signs BP 135/64 (BP Location: Left Arm)   Pulse 65   Temp 98.3 F (36.8 C)   Resp 16   Ht 5\' 2"  (1.575 m)   Wt 67.1 kg   LMP 08/17/1977 (Approximate)   SpO2 100%   BMI 27.07 kg/m  Physical Exam Vitals and nursing note reviewed.  Constitutional:      Appearance: She is not ill-appearing or toxic-appearing.  HENT:     Head: Normocephalic and atraumatic.  Eyes:     General: No scleral icterus.       Right eye: No discharge.        Left eye: No discharge.     Conjunctiva/sclera: Conjunctivae normal.  Cardiovascular:     Pulses:          Radial pulses are 2+ on the right side and 2+ on the left side.  Pulmonary:     Effort: Pulmonary effort is normal.  Musculoskeletal:       Hands:  Skin:    General: Skin is warm and dry.  Neurological:     General: No focal deficit present.     Mental Status: She is alert.  Psychiatric:        Mood and Affect: Mood normal.     ED Results / Procedures / Treatments   Labs (all labs ordered are listed, but only abnormal results are displayed) Labs Reviewed - No data to display  EKG None  Radiology No results found.  Procedures Procedures    Medications Ordered in ED Medications  rabies immune globulin (HYPERRAB/KEDRAB) injection 1,350 Units (has no administration in time range)  rabies vaccine (RABAVERT) injection 1 mL (has no administration in time range)  bacitracin ointment (has no administration in time range)    ED Course/ Medical Decision Making/ A&P                                 Medical Decision Making 78 year old female who presents with bat bite.  Normal  vitals, reassuring exam.  2 small puncture wounds consistent with reported bite over the thenar eminence.  Wound irrigated and dressed with bacitracin by ED RN.  Discussion with the patient regarding role of rabies immunoglobulin and vaccination and patient is amenable to this plan.  Case discussed with ED pharmacist sheltie, no antibiotics recommended at this time.  Clinical concern for emergent underlying condition or  injury that would warrant further ED workup and patient management is exceedingly low.    Risk Prescription drug management.   Kayton was provided with the education regarding return for subsequent doses of rabies vaccination.  Stefanny and her husband  voiced understanding of her medical evaluation and treatment plan. Each of their questions answered to their expressed satisfaction.  Return precautions were given.  Patient is well-appearing, stable, and was discharged in good condition.  This chart was dictated using voice recognition software, Dragon. Despite the best efforts of this provider to proofread and correct errors, errors may still occur which can change documentation meaning.         Final Clinical Impression(s) / ED Diagnoses Final diagnoses:  Bat bite wound    Rx / DC Orders ED Discharge Orders     None         Sherrilee Gilles 05/31/23 0118    Zadie Rhine, MD 05/31/23 [id number]

## 2023-05-31 NOTE — Discharge Instructions (Addendum)
You were seen in the ER today for your bat bite.  You received the first dose of rabies vaccine and rabies anticoagulant.  You will need to return for subsequent doses of rabies vaccination on days  3 (10/17), 7 (10/21), and 14 (10/28).  Please keep the wound clean and dry.  You may use topical antibiotic ointment.  Follow-up with your primary care doctor and return to the ER with any new severe symptoms.

## 2023-05-31 NOTE — ED Notes (Signed)
After hours non emergency line call and patient information given. Animal control to contact this nurse back.

## 2023-05-31 NOTE — ED Notes (Signed)
Officer Knox with animal control called back and took more information on incident that took place with the bat. Officer will contact her in the morning as well as a Veterinary surgeon will call her. Patient notified.

## 2023-06-01 DIAGNOSIS — M353 Polymyalgia rheumatica: Secondary | ICD-10-CM | POA: Diagnosis not present

## 2023-06-01 DIAGNOSIS — G4733 Obstructive sleep apnea (adult) (pediatric): Secondary | ICD-10-CM | POA: Diagnosis not present

## 2023-06-01 DIAGNOSIS — J189 Pneumonia, unspecified organism: Secondary | ICD-10-CM | POA: Diagnosis not present

## 2023-06-01 DIAGNOSIS — E876 Hypokalemia: Secondary | ICD-10-CM | POA: Diagnosis not present

## 2023-06-01 DIAGNOSIS — J9601 Acute respiratory failure with hypoxia: Secondary | ICD-10-CM | POA: Diagnosis not present

## 2023-06-01 DIAGNOSIS — D649 Anemia, unspecified: Secondary | ICD-10-CM | POA: Diagnosis not present

## 2023-06-01 DIAGNOSIS — Z96611 Presence of right artificial shoulder joint: Secondary | ICD-10-CM | POA: Diagnosis not present

## 2023-06-01 DIAGNOSIS — W5581XA Bitten by other mammals, initial encounter: Secondary | ICD-10-CM | POA: Diagnosis not present

## 2023-06-01 DIAGNOSIS — I10 Essential (primary) hypertension: Secondary | ICD-10-CM | POA: Diagnosis not present

## 2023-06-02 ENCOUNTER — Ambulatory Visit: Payer: Self-pay

## 2023-06-02 NOTE — Telephone Encounter (Signed)
  Chief Complaint: advice Symptoms: NA, needing rabies vaccine Frequency: tomorrow Pertinent Negatives: NA Disposition: [] ED /[] Urgent Care (no appt availability in office) / [] Appointment(In office/virtual)/ []  [place] [facility]/ [] Home Care/ [] Refused Recommended Disposition /[] [place] [facility]/ [x]  Follow-up with PCP Additional Notes: pt went to ED on 05/30/23 for bat bite, was given rabies vaccine. Is due for rabies vaccines 3 (10/17), 7 (10/21), and 14 (10/28). Pt is asking if she had to go to ED or PCP. Advised pt she can go to ED or call PCP to make sure they have the vaccine and could go there. Pt will reach out to PCP.   Summary: second round of shots   Patient went to the ED on 05/30/2023 for being bitten by a bat. Pt has received her first round of six rounds of shots and is wanting to know if she needs to go back to the ED for her second round of shots. Pt states that she can come tomorrow afternoon for her second round. Please advise.         Reason for Disposition  [1] Caller requesting NON-URGENT health information AND [2] PCP's office is the best resource  Answer Assessment - Initial Assessment Questions 1. REASON FOR CALL or QUESTION: "What is your reason for calling today?" or "How can I best help you?" or "What question do you have that I can help answer?"     Wanting to know if she has to go to ED for rabies vaccine or go to PCP  Protocols used: Information Only Call - No Triage-A-AH

## 2023-06-02 NOTE — Therapy (Signed)
OUTPATIENT PHYSICAL THERAPY UPPER EXTREMITY EVALUATION   Patient Name: Tracey [name] MRN: [id number] DOB:[birth year], 77 y.o., female Today's Date: 06/03/2023  END OF SESSION:  PT End of Session - 06/03/23 1115     Visit Number 1    Date for PT Re-Evaluation 07/29/23    Authorization Type Medicare-KX at 15    Progress Note Due on Visit 10    PT Start Time 1018    PT Stop Time 1108    PT Time Calculation (min) 50 min    Activity Tolerance Patient tolerated treatment well    Behavior During Therapy [facility] for tasks assessed/performed             Past Medical History:  Diagnosis Date   Allergy    Anxiety    takes Ativan daily prn   Arthritis    Breast cancer (HCC) 2014   ER+/PR+/HEr2-,    Bruises easily    Colon polyps    2 polyps by report   Coronary artery disease involving native coronary artery of native heart without angina pectoris 01/01/2014   NSTEMI in January 2019 in [place]>> s/p DES x2 to the LAD (procedure complicated by stent thrombosis) Myoview 05/2020: EF 72, no infarct or ischemia; low risk Echocardiogram 05/2019: EF 60-65, GLS -21.5 Cardiac catheterization at [facility] in 08/2017: Normal left main, normal LCx, normal RCA; LAD/diagonal 95%>> PCI    Diverticulosis    GERD (gastroesophageal reflux disease)    occasionally takes Nexium    Gout    Headache    MIgraines- no headaches- has floater in eyes   History of bladder infections    many yrs ago   History of bronchitis    last time at least 53yrs ago   History of hiatal hernia    History of stress incontinence    Hyperlipidemia    takes Pravastatin daily   Hypertension    Insomnia    takes Ambien nightly prn   NSTEMI (non-ST elevated myocardial infarction) (HCC) 08/2017   OSA (obstructive sleep apnea)    on CPAP   Osteoarthritis    Peripheral edema    takes Furosemide daily prn   PONV (postoperative nausea and vomiting)    Postmenopausal hormone therapy    Radiation  07/27/13-09/07/13   Right Breast x 31 treatments   Rhinitis    uses Flonase prn   Sinus congestion    Status post breast reconstruction 10/15/2014   Bilateral implant removal and DIEP performed in [place], [place]   Tachycardia    takes Metoprolol daily   Ventricular tachycardia, non-sustained (HCC)    during sleep study 2009 with normal cardiac workup   Past Surgical History:  Procedure Laterality Date   ABDOMINAL HYSTERECTOMY     with BSO   APPENDECTOMY     ARTERY BIOPSY Left 05/15/2022   Procedure: BIOPSY TEMPORAL ARTERY;  Surgeon: Cephus Shelling, MD;  Location: [facility] OR;  Service: Vascular;  Laterality: Left;   AXILLARY SENTINEL NODE BIOPSY Right 05/23/2013   Procedure: AXILLARY SENTINEL NODE BIOPSY;  Surgeon: Adolph Pollack, MD;  Location: [facility] OR;  Service: General;  Laterality: Right;  nuc med injection 7:00   BREAST RECONSTRUCTION WITH PLACEMENT OF TISSUE EXPANDER AND FLEX HD (ACELLULAR HYDRATED DERMIS) Bilateral 05/23/2013   Procedure: BILATERAL BREAST RECONSTRUCTION WITH PLACEMENT OF TISSUE EXPANDER AND FLEX HD;  Surgeon: Etter Sjogren, MD;  Location: [facility] OR;  Service: Plastics;  Laterality: Bilateral;   CARDIAC CATHETERIZATION  1999  CATARACT EXTRACTION, BILATERAL Bilateral    CHOLECYSTECTOMY     COSMETIC SURGERY     OTHER SURGICAL HISTORY  09/2017   had cyst removal from spine    PERCUTANEOUS CORONARY STENT INTERVENTION (PCI-S)  08/2017   done in [place]     REMOVAL OF BILATERAL TISSUE EXPANDERS WITH PLACEMENT OF BILATERAL BREAST IMPLANTS Bilateral 05/17/2014   Procedure: REMOVAL OF BILATERAL TISSUE EXPANDERS WITH PLACEMENT OF BILATERAL BREAST IMPLANTS FOR RECONSTRUCTION;  Surgeon: Etter Sjogren, MD;  Location: [facility] OR;  Service: Plastics;  Laterality: Bilateral;   REVERSE SHOULDER ARTHROPLASTY Right 05/13/2023   Procedure: REVERSE SHOULDER ARTHROPLASTY;  Surgeon: Francena Hanly, MD;  Location: [facility] ORS;  Service: Orthopedics;  Laterality: Right;   TONSILLECTOMY     TOTAL KNEE  ARTHROPLASTY Right 07/26/2018   Procedure: TOTAL KNEE ARTHROPLASTY;  Surgeon: Durene Romans, MD;  Location: [facility] ORS;  Service: Orthopedics;  Laterality: Right;  70 mins   TOTAL KNEE ARTHROPLASTY Left 12/02/2021   Procedure: TOTAL KNEE ARTHROPLASTY;  Surgeon: Durene Romans, MD;  Location: [facility] ORS;  Service: Orthopedics;  Laterality: Left;   TOTAL MASTECTOMY Bilateral 05/23/2013   Procedure: TOTAL MASTECTOMY;  Surgeon: Adolph Pollack, MD;  Location: [facility] OR;  Service: General;  Laterality: Bilateral;   Patient Active Problem List   Diagnosis Date Noted   CAP (community acquired pneumonia) 05/18/2023   Hypokalemia 05/18/2023   Anemia 05/18/2023   Acute hypoxemic respiratory failure (HCC) 05/17/2023   S/P reverse total shoulder arthroplasty, right 05/13/2023   Fever of unknown origin 06/10/2022   Stage 3b chronic kidney disease (HCC) 06/10/2022   ARF (acute renal failure) (HCC) 12/30/2021   DOE (dyspnea on exertion) 12/29/2021   S/P total knee arthroplasty, left 12/02/2021   Preoperative cardiovascular examination 11/18/2021   REM behavioral disorder 06/12/2021   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   CPAP (continuous positive airway pressure) dependence 01/20/2021   Unsteady gait when walking 01/20/2021   Complaint related to dreams 06/05/2019   Metabolic acidosis with increased anion gap and accumulation of organic acids 08/17/2018   Nausea, vomiting, and diarrhea 08/17/2018   AKI (acute kidney injury) (HCC) 08/17/2018   Overweight (BMI 25.0-29.9) 07/27/2018   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/27/2018   S/P right TKA 07/26/2018   S/P knee replacement 07/26/2018   UARS (upper airway resistance syndrome) 05/30/2018   Hyperlipidemia LDL goal <70 05/02/2018   Leg injury, right, initial encounter 01/06/2017   Contusion of right lower leg 01/06/2017   Hypoxemia 12/23/2015   Statin myopathy 05/20/2015   MCI (mild cognitive impairment)  05/20/2015   Edema 05/17/2015   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   Breast cancer genetic susceptibility 04/02/2015   OSA on CPAP 04/02/2015   Coronary artery disease involving native coronary artery with angina pectoris (HCC) 01/01/2014   Chest pain 01/01/2014   Essential hypertension    Ventricular tachycardia, non-sustained (HCC)    Breast cancer of upper-outer quadrant of right female breast (HCC) 06/28/2013   Postoperative visit 06/02/2013   Gout    Tachycardia     PCP: Alysia Penna, MD   REFERRING PROVIDER: Francena Hanly, MD  REFERRING DIAG: [phone] presence of Rt artificial joint  THERAPY DIAG:  Acute pain of right shoulder - Plan: PT plan of care cert/re-cert  Stiffness of right shoulder, not elsewhere classified - Plan: PT plan of care cert/re-cert  Muscle weakness (generalized) - Plan: PT plan of care cert/re-cert  Rationale for Evaluation and Treatment: Rehabilitation  ONSET DATE: Reverse total shoulder replacement 05/13/23  SUBJECTIVE:                                                                                                                                                                                      SUBJECTIVE STATEMENT: Pt presents to PT s/p Rt reverse total shoulder replacement performed on 05/13/23 secondary to pain and OA.   Pt is working to wean from the sling.   Hand dominance: Right  PERTINENT HISTORY: Rt reverse total shoulder replacement 04/2023, Breast cancer 2014   PAIN:  Are you having pain? Yes: NPRS scale: 6/10 (neck), 3-4/10 Rt shoulder  Pain location: neck, Rt shoulder  Pain description: shoulder aching  Aggravating factors: trying to use Rt shoulder Relieving factors: pain medication, ice  PRECAUTIONS: Other: No arm bike due to lymph node removal in 2014  Follow protocol- scanned in media  RED FLAGS: None   WEIGHT BEARING RESTRICTIONS: No  FALLS:  Has patient fallen in last 6 months? No  LIVING  ENVIRONMENT: Lives with: lives with their spouse Lives in: House/apartment  OCCUPATION: Retired   PLOF: Independent and Leisure: golf  PATIENT GOALS: Rt shoulder strength, ROM, return to functional use  NEXT MD VISIT: 6 months?   OBJECTIVE:  Note: Objective measures were completed at Evaluation unless otherwise noted.  DIAGNOSTIC FINDINGS:  None   PATIENT SURVEYS :  06/03/23: FOTO 40 (goal is 56)  COGNITION: Overall cognitive status: Within functional limits for tasks assessed     SENSATION: WFL  POSTURE: Forward head   UPPER EXTREMITY ROM:   Passive ROM Right eval Left eval  Shoulder flexion 85 with pain Full throughout   Shoulder extension    Shoulder abduction 60- pain, protocol limit   Shoulder adduction    Shoulder internal rotation    Shoulder external rotation 20 degrees-Pain   Elbow flexion full   Elbow extension Full    Wrist flexion    Wrist extension    Wrist ulnar deviation    Wrist radial deviation    Wrist pronation    Wrist supination    (Blank rows = not tested)  UPPER EXTREMITY MMT: Not tested due to post-op   JOINT MOBILITY TESTING:  NA  PALPATION:  Tension and palpable tenderness over Rt neck, upper trap and globally about the Rt shoulder.  Well healing surgical incision with reduced mobility.    TODAY'S TREATMENT:  DATE: 06/03/23 HEP established-see below  PATIENT EDUCATION: Education details: Access Code: [email or web address], protocol limits and protection phase of rehab Person educated: Patient Education method: Explanation, Demonstration, and Handouts Education comprehension: verbalized understanding and returned demonstration  HOME EXERCISE PROGRAM: Access Code: [email or web address] URL: [email or web address] Date: 06/03/2023 Prepared by: Tresa Endo  Exercises - Seated Gripping Towel  - 3 x daily  - 7 x weekly - 1 sets - 10 reps - 5shoulder ab hold - Isometric Shoulder Abduction at Wall  - 3 x daily - 7 x weekly - 1 sets - 10 reps - 5 hold - Seated Elbow Flexion and Extension AROM  - 3 x daily - 7 x weekly - 1-2 sets - 10 reps - Supine Shoulder Flexion PROM with Caregiver  - 1 x daily - 7 x weekly - 3 sets - 10 reps - Supine Shoulder Flexion AAROM with Hands Clasped  - 3 x daily - 7 x weekly - 1 sets - 10 reps - 5 hold - Seated Gentle Upper Trapezius Stretch  - 3 x daily - 7 x weekly - 1 sets - 3 reps - 20 hold - Seated Cervical Rotation AROM  - 3 x daily - 7 x weekly - 1 sets - 3 reps - 20 hold  ASSESSMENT:  CLINICAL IMPRESSION: Patient is a 77 y.o. female who was seen today for physical therapy evaluation and treatment for Rt shoulder s/p reverse total shoulder replacement performed on 05/13/23.  Pt is currently weaning from the sling and is following post-op precautions. Pt with Rt shoulder P/ROM that is limited by protocol.  Pt with well healing surgical incision and tenderness and trigger points in Rt neck and globally about the shoulder. PT instructed pt in initial HEP and her husband was present to learn P/ROM and understood 90 degree limit into flexion.  Patient will benefit from skilled PT to address the below impairments and improve overall function.    OBJECTIVE IMPAIRMENTS: decreased activity tolerance, decreased ROM, decreased strength, increased muscle spasms, impaired flexibility, impaired UE functional use, postural dysfunction, and pain.   ACTIVITY LIMITATIONS: carrying, lifting, dressing, self feeding, and reach over head  PARTICIPATION LIMITATIONS: meal prep, cleaning, laundry, shopping, and community activity  PERSONAL FACTORS: 1 comorbidity: Rt total shoulder replacement   are also affecting patient's functional outcome.   REHAB POTENTIAL: Good  CLINICAL DECISION MAKING: Stable/uncomplicated  EVALUATION COMPLEXITY: Low  GOALS: Goals reviewed with patient?  Yes  SHORT TERM GOALS: Target date: 07/01/2023    Be independent in initial HEP Baseline: Goal status: INITIAL  2.  Report > or = to 30% use of Rt UE with light home and self-care tasks due to improve ROM and strength  Baseline:  Goal status: INITIAL  3.  Demonstrate Rt shoulder P/ROM flexion to > or = to 110 degrees to improve reaching  Baseline: 85 Goal status: INITIAL  4.  Perform Rt shoulder ER to back of head to improve independence with self-care Baseline: 20 Goal status: INITIAL  LONG TERM GOALS: Target date: 07/29/2023    Be independent in advanced HEP Baseline:  Goal status: INITIAL  2.  Improve FOTO > or = to 59 to improve functional use of Rt UE Baseline: 40 Goal status: INITIAL  3. Improve Rt shoulder A/ROM flexion to > or = to 115 degrees to allow for reaching overhead into cabinets Baseline:  Goal status: INITIAL  4.  Report > or = to 70% of normal use of Rt UE for ADLs due to improved  functional strength and A/ROM Baseline:  Goal status: INITIAL  5.  Demonstrate Rt shoulder A/ROM ER to T1 to improve self care Baseline:  Goal status: INITIAL  6.  Perform golf swing (chipping motion) without significant pain of substitution in Rt UE Baseline:  Goal status: INITIAL  PLAN: PT FREQUENCY: 2x/week  PT DURATION: 8 weeks  PLANNED INTERVENTIONS: 97110-Therapeutic exercises, 97530- Therapeutic activity, [id number]- Neuromuscular re-education, 97535- Self Care, [id number]- Manual therapy, [phone]- Canalith repositioning, [id number]- Aquatic Therapy, 97014- Electrical stimulation (unattended), [phone]- Ionotophoresis 4mg /ml Dexamethasone, Patient/Family education, Taping, Dry Needling, Joint mobilization, Spinal mobilization, Vestibular training, Cryotherapy, and Moist heat  PLAN FOR NEXT SESSION: follow post-op protocol, review HEP, work with pulleys to see if pt is ready to use at home.  Add Rt shoulder abduction and ER isometrics, uppercut and hitchhiker exercise to  HEP   Lorrene Reid, PT 06/03/23 12:34 PM   [facility] [address] [place], [place] [address] Phone # [phone] Fax [phone]

## 2023-06-03 ENCOUNTER — Emergency Department (HOSPITAL_COMMUNITY)
Admission: EM | Admit: 2023-06-03 | Discharge: 2023-06-03 | Disposition: A | Discharge status: Home / Self Care | Payer: Medicare Other | Attending: Emergency Medicine | Admitting: Emergency Medicine

## 2023-06-03 ENCOUNTER — Encounter (HOSPITAL_COMMUNITY): Payer: Self-pay

## 2023-06-03 ENCOUNTER — Other Ambulatory Visit: Payer: Self-pay

## 2023-06-03 ENCOUNTER — Ambulatory Visit: Payer: Medicare Other | Attending: Internal Medicine

## 2023-06-03 DIAGNOSIS — M25511 Pain in right shoulder: Secondary | ICD-10-CM | POA: Diagnosis not present

## 2023-06-03 DIAGNOSIS — Z2914 Encounter for prophylactic rabies immune globin: Secondary | ICD-10-CM | POA: Diagnosis not present

## 2023-06-03 DIAGNOSIS — Z203 Contact with and (suspected) exposure to rabies: Secondary | ICD-10-CM | POA: Insufficient documentation

## 2023-06-03 DIAGNOSIS — M6281 Muscle weakness (generalized): Secondary | ICD-10-CM

## 2023-06-03 DIAGNOSIS — Z23 Encounter for immunization: Secondary | ICD-10-CM | POA: Diagnosis not present

## 2023-06-03 DIAGNOSIS — M25611 Stiffness of right shoulder, not elsewhere classified: Secondary | ICD-10-CM | POA: Diagnosis not present

## 2023-06-03 MED ORDER — RABIES VACCINE, PCEC IM SUSR
1.0000 mL | Freq: Once | INTRAMUSCULAR | Status: AC
  Administered 2023-06-03: 1 mL via INTRAMUSCULAR
  Filled 2023-06-03: qty 1

## 2023-06-03 NOTE — ED Triage Notes (Signed)
Patient needs second rabies shot for bat bite.

## 2023-06-03 NOTE — ED Provider Notes (Signed)
Tracey [name] EMERGENCY DEPARTMENT AT [facility] Provider Note   CSN: [id number] Arrival date & time: Tracey [name]  1136     History  Chief Complaint  Patient presents with   Rabies Injection    Tracey [name] is a 77 y.o. female.  This is a 77 year old female is here today for her second rabies vaccine.  She was bit by a bat.        Home Medications Prior to Admission medications   Medication Sig Start Date End Date Taking? Authorizing Provider  allopurinol (ZYLOPRIM) 100 MG tablet TAKE 1 TABLET BY MOUTH ONCE DAILY 06/25/16   Magrinat, Valentino Hue, MD  ASPIRIN LOW DOSE 81 MG chewable tablet Chew 81 mg by mouth daily.  09/19/17   [provider]  benzonatate (TESSALON) 200 MG capsule Take 1 capsule (200 mg total) by mouth 3 (three) times daily as needed for cough. 05/20/23   Kathlen Mody, MD  Budeson-Glycopyrrol-Formoterol (BREZTRI AEROSPHERE) 160-9-4.8 MCG/ACT AERO Inhale 2 puffs into the lungs in the morning and at bedtime. Patient taking differently: Inhale 2 puffs into the lungs as needed (wheezing/SOB). 06/19/22   Nyoka Cowden, MD  Cholecalciferol (VITAMIN D3) 50 MCG (2000 UT) TABS Take 2,000 Units by mouth daily.    [provider]  clopidogrel (PLAVIX) 75 MG tablet TAKE 1 TABLET(75 MG) BY MOUTH DAILY 03/17/23   Tonny Bollman, MD  cyanocobalamin (,VITAMIN B-12,) 1000 MCG/ML injection Inject 1,000 mcg into the muscle every 30 (thirty) days.    [provider]  ezetimibe (ZETIA) 10 MG tablet TAKE 1 TABLET(10 MG) BY MOUTH DAILY 03/17/23   Tonny Bollman, MD  fluticasone [facility]) 50 MCG/ACT nasal spray Place 2 sprays into both nostrils daily. 01/18/13   [provider]  folic acid (FOLVITE) 1 MG tablet Take 1 mg by mouth daily.    [provider]  furosemide (LASIX) 40 MG tablet Take 40 mg by mouth daily as needed for fluid.  04/01/13   [provider]  hydroxychloroquine (PLAQUENIL) 200 MG tablet Take 300 mg by mouth  daily.    [provider]  loratadine (CLARITIN) 10 MG tablet Take 10 mg by mouth daily.    [provider]  metoprolol succinate (TOPROL-XL) 25 MG 24 hr tablet TAKE 1 TABLET(25 MG) BY MOUTH DAILY 06/16/22   Tonny Bollman, MD  Multiple Vitamin (MULTI-VITAMIN) tablet Take 1 tablet by mouth daily.    [provider]  nitroGLYCERIN (NITROSTAT) 0.4 MG SL tablet Place 1 tablet (0.4 mg total) under the tongue every 5 (five) minutes as needed for chest pain. 06/02/21   Alver Sorrow, NP  pantoprazole (PROTONIX) 40 MG tablet TAKE 1 TABLET(40 MG) BY MOUTH DAILY 05/12/23   Tonny Bollman, MD  polyethylene glycol (MIRALAX / GLYCOLAX) 17 g packet Take 17 g by mouth daily as needed for mild constipation. 12/03/21   Cassandria Anger, PA-C  predniSONE (DELTASONE) 1 MG tablet Take 4 mg by mouth daily. 05/21/22   [provider]  predniSONE (DELTASONE) 5 MG tablet Take 5 mg by mouth daily with breakfast.    [provider]  Propylene Glycol (SYSTANE COMPLETE) 0.6 % SOLN Place 2 drops into both eyes 2 (two) times daily.    [provider]  rosuvastatin (CRESTOR) 20 MG tablet Take 1 tablet (20 mg total) by mouth daily. 12/07/22   Tonny Bollman, MD  sertraline (ZOLOFT) 100 MG tablet Take 50 mg by mouth daily. 12/06/20   [provider]  testosterone cypionate (DEPOTESTOSTERONE CYPIONATE) 200 MG/ML injection Inject 200 mg into the muscle every 30 (thirty) days. 01/17/19   [provider]  traMADol (ULTRAM) 50 MG tablet Take 100 mg by mouth 2 (two) times daily.    [provider]      Allergies    Pseudoeph-hydrocodone-gg, Ivp dye [iodinated contrast media], Oxycodone, Sulfa antibiotics, and Tape    Review of Systems   Review of Systems  Physical Exam Updated Vital Signs BP (!) 161/81 (BP Location: Left Arm)   Pulse 66   Temp 97.7 F (36.5 C) (Oral)   Resp 17   Ht 5\' 2"  (1.575 m)   Wt 67.1 kg   LMP 08/17/1977 (Approximate)    SpO2 100%   BMI 27.07 kg/m  Physical Exam Skin:    General: Skin is warm and dry.     Comments: Well-healing bite, no surrounding erythema     ED Results / Procedures / Treatments   Labs (all labs ordered are listed, but only abnormal results are displayed) Labs Reviewed - No data to display  EKG None  Radiology No results found.  Procedures Procedures    Medications Ordered in ED Medications  rabies vaccine (RABAVERT) injection 1 mL (has no administration in time range)    ED Course/ Medical Decision Making/ A&P                                 Medical Decision Making 77 year old female here today for her second rabies vaccine.  Plan #1 does not show signs of infection.  Patient feeling well.  Vaccination provided, discharge.  Risk Prescription drug management.           Final Clinical Impression(s) / ED Diagnoses Final diagnoses:  Need for rabies vaccination    Rx / DC Orders ED Discharge Orders     None         Arletha Pili, DO Tracey [name] 1157

## 2023-06-03 NOTE — Discharge Instructions (Addendum)
   You will need to return for subsequent doses of rabies vaccination on days  3 (10/17),---Done today!---7 (10/21), and 14 (10/28).  Please keep the wound clean and dry.  You may use topical antibiotic ointment.  Follow-up with your primary care doctor and return to the ER with any new severe symptoms.

## 2023-06-07 ENCOUNTER — Other Ambulatory Visit: Payer: Self-pay

## 2023-06-07 ENCOUNTER — Emergency Department (HOSPITAL_COMMUNITY)
Admission: EM | Admit: 2023-06-07 | Discharge: 2023-06-07 | Disposition: A | Discharge status: Against Medical Advice | Payer: Medicare Other | Attending: Emergency Medicine | Admitting: Emergency Medicine

## 2023-06-07 ENCOUNTER — Encounter (HOSPITAL_COMMUNITY): Payer: Self-pay

## 2023-06-07 DIAGNOSIS — Z203 Contact with and (suspected) exposure to rabies: Secondary | ICD-10-CM | POA: Insufficient documentation

## 2023-06-07 DIAGNOSIS — H539 Unspecified visual disturbance: Secondary | ICD-10-CM | POA: Diagnosis not present

## 2023-06-07 DIAGNOSIS — Z5321 Procedure and treatment not carried out due to patient leaving prior to being seen by health care provider: Secondary | ICD-10-CM | POA: Diagnosis not present

## 2023-06-07 DIAGNOSIS — Z6826 Body mass index (BMI) 26.0-26.9, adult: Secondary | ICD-10-CM | POA: Diagnosis not present

## 2023-06-07 DIAGNOSIS — M064 Inflammatory polyarthropathy: Secondary | ICD-10-CM | POA: Diagnosis not present

## 2023-06-07 DIAGNOSIS — M2540 Effusion, unspecified joint: Secondary | ICD-10-CM | POA: Diagnosis not present

## 2023-06-07 DIAGNOSIS — R5383 Other fatigue: Secondary | ICD-10-CM | POA: Diagnosis not present

## 2023-06-07 DIAGNOSIS — M353 Polymyalgia rheumatica: Secondary | ICD-10-CM | POA: Diagnosis not present

## 2023-06-07 DIAGNOSIS — M329 Systemic lupus erythematosus, unspecified: Secondary | ICD-10-CM | POA: Diagnosis not present

## 2023-06-07 DIAGNOSIS — M0609 Rheumatoid arthritis without rheumatoid factor, multiple sites: Secondary | ICD-10-CM | POA: Diagnosis not present

## 2023-06-07 DIAGNOSIS — M2560 Stiffness of unspecified joint, not elsewhere classified: Secondary | ICD-10-CM | POA: Diagnosis not present

## 2023-06-07 DIAGNOSIS — E663 Overweight: Secondary | ICD-10-CM | POA: Diagnosis not present

## 2023-06-07 DIAGNOSIS — R768 Other specified abnormal immunological findings in serum: Secondary | ICD-10-CM | POA: Diagnosis not present

## 2023-06-07 NOTE — ED Provider Triage Note (Cosign Needed)
Emergency Medicine Provider Triage Evaluation Note  Tracey [name] , a 77 y.o. female  was evaluated in triage.  Pt complains of rabies vaccination.  Review of Systems  Positive:  Negative:   Physical Exam  BP 137/83   Pulse 66   Temp 98.8 F (37.1 C) (Oral)   Resp 19   Ht 5\' 2"  (1.575 m)   Wt 67 kg   LMP 08/17/1977 (Approximate)   SpO2 97%   BMI 27.02 kg/m  Gen:   Awake, no distress   Resp:  Normal effort  MSK:   Moves extremities without difficulty  Other:    Medical Decision Making  Medically screening exam initiated at 4:35 PM.  Appropriate orders placed.  Tracey [name] was informed that the remainder of the evaluation will be completed by another provider, this initial triage assessment does not replace that evaluation, and the importance of remaining in the ED until their evaluation is complete.  Wanting 2nd dose of rabies vaccine. Was bit by a bat. Denies any symptoms. Tolerated first vaccine dose well.    Tracey [name], [place] 06/07/23 [phone]

## 2023-06-07 NOTE — ED Triage Notes (Signed)
Patient stated she came for her 3rd rabies shot. Got bit by bat.

## 2023-06-07 NOTE — Plan of Care (Signed)
[facility] Tonsillectomy/Adenoidectomy, Postoperative PEDS care plan entered in error.

## 2023-06-08 ENCOUNTER — Ambulatory Visit: Payer: Medicare Other

## 2023-06-08 ENCOUNTER — Ambulatory Visit (HOSPITAL_COMMUNITY)
Admission: EM | Admit: 2023-06-08 | Discharge: 2023-06-08 | Disposition: A | Discharge status: Home / Self Care | Payer: Medicare Other | Attending: Internal Medicine | Admitting: Internal Medicine

## 2023-06-08 DIAGNOSIS — Z203 Contact with and (suspected) exposure to rabies: Secondary | ICD-10-CM | POA: Diagnosis not present

## 2023-06-08 DIAGNOSIS — M6281 Muscle weakness (generalized): Secondary | ICD-10-CM

## 2023-06-08 DIAGNOSIS — M25511 Pain in right shoulder: Secondary | ICD-10-CM | POA: Diagnosis not present

## 2023-06-08 DIAGNOSIS — Z23 Encounter for immunization: Secondary | ICD-10-CM | POA: Diagnosis not present

## 2023-06-08 DIAGNOSIS — M25611 Stiffness of right shoulder, not elsewhere classified: Secondary | ICD-10-CM | POA: Diagnosis not present

## 2023-06-08 MED ORDER — RABIES VACCINE, PCEC IM SUSR
1.0000 mL | Freq: Once | INTRAMUSCULAR | Status: AC
  Administered 2023-06-08: 1 mL via INTRAMUSCULAR

## 2023-06-08 MED ORDER — RABIES VACCINE, PCEC IM SUSR
INTRAMUSCULAR | Status: AC
  Filled 2023-06-08: qty 1

## 2023-06-08 NOTE — ED Notes (Addendum)
Allergies reviewed prior to administration. No complications/reactions with rabies injections.

## 2023-06-08 NOTE — ED Triage Notes (Signed)
Patient states she is coming her for her 3rd rabies shot. Attempted to go to [facility] ER to get vaccine "although we left after hours of waiting."

## 2023-06-08 NOTE — Therapy (Signed)
OUTPATIENT PHYSICAL THERAPY TREATMENT   Patient Name: SEE OMALLEY MRN: [id number] DOB:06-20-1946, 77 y.o., female Today's Date: 06/08/2023  END OF SESSION:  PT End of Session - 06/08/23 1236     Visit Number 2    Date for PT Re-Evaluation 07/29/23    Authorization Type Medicare-KX at 15    Progress Note Due on Visit 10    PT Start Time 1147    PT Stop Time 1232    PT Time Calculation (min) 45 min    Activity Tolerance Patient tolerated treatment well    Behavior During Therapy [facility] for tasks assessed/performed              Past Medical History:  Diagnosis Date   Allergy    Anxiety    takes Ativan daily prn   Arthritis    Breast cancer (HCC) 2014   ER+/PR+/HEr2-,    Bruises easily    Colon polyps    2 polyps by report   Coronary artery disease involving native coronary artery of native heart without angina pectoris 01/01/2014   NSTEMI in January 2019 in [place]>> s/p DES x2 to the LAD (procedure complicated by stent thrombosis) Myoview 05/2020: EF 72, no infarct or ischemia; low risk Echocardiogram 05/2019: EF 60-65, GLS -21.5 Cardiac catheterization at [facility] in 08/2017: Normal left main, normal LCx, normal RCA; LAD/diagonal 95%>> PCI    Diverticulosis    GERD (gastroesophageal reflux disease)    occasionally takes Nexium    Gout    Headache    MIgraines- no headaches- has floater in eyes   History of bladder infections    many yrs ago   History of bronchitis    last time at least 83yrs ago   History of hiatal hernia    History of stress incontinence    Hyperlipidemia    takes Pravastatin daily   Hypertension    Insomnia    takes Ambien nightly prn   NSTEMI (non-ST elevated myocardial infarction) (HCC) 08/2017   OSA (obstructive sleep apnea)    on CPAP   Osteoarthritis    Peripheral edema    takes Furosemide daily prn   PONV (postoperative nausea and vomiting)    Postmenopausal hormone therapy    Radiation 07/27/13-09/07/13    Right Breast x 31 treatments   Rhinitis    uses Flonase prn   Sinus congestion    Status post breast reconstruction 10/15/2014   Bilateral implant removal and DIEP performed in [place], [place]   Tachycardia    takes Metoprolol daily   Ventricular tachycardia, non-sustained (HCC)    during sleep study 2009 with normal cardiac workup   Past Surgical History:  Procedure Laterality Date   ABDOMINAL HYSTERECTOMY     with BSO   APPENDECTOMY     ARTERY BIOPSY Left 05/15/2022   Procedure: BIOPSY TEMPORAL ARTERY;  Surgeon: Cephus Shelling, MD;  Location: [facility] OR;  Service: Vascular;  Laterality: Left;   AXILLARY SENTINEL NODE BIOPSY Right 05/23/2013   Procedure: AXILLARY SENTINEL NODE BIOPSY;  Surgeon: Adolph Pollack, MD;  Location: [facility] OR;  Service: General;  Laterality: Right;  nuc med injection 7:00   BREAST RECONSTRUCTION WITH PLACEMENT OF TISSUE EXPANDER AND FLEX HD (ACELLULAR HYDRATED DERMIS) Bilateral 05/23/2013   Procedure: BILATERAL BREAST RECONSTRUCTION WITH PLACEMENT OF TISSUE EXPANDER AND FLEX HD;  Surgeon: Etter Sjogren, MD;  Location: [facility] OR;  Service: Plastics;  Laterality: Bilateral;   CARDIAC CATHETERIZATION  1999   CATARACT  EXTRACTION, BILATERAL Bilateral    CHOLECYSTECTOMY     COSMETIC SURGERY     OTHER SURGICAL HISTORY  09/2017   had cyst removal from spine    PERCUTANEOUS CORONARY STENT INTERVENTION (PCI-S)  08/2017   done in [place]     REMOVAL OF BILATERAL TISSUE EXPANDERS WITH PLACEMENT OF BILATERAL BREAST IMPLANTS Bilateral 05/17/2014   Procedure: REMOVAL OF BILATERAL TISSUE EXPANDERS WITH PLACEMENT OF BILATERAL BREAST IMPLANTS FOR RECONSTRUCTION;  Surgeon: Etter Sjogren, MD;  Location: [facility] OR;  Service: Plastics;  Laterality: Bilateral;   REVERSE SHOULDER ARTHROPLASTY Right 05/13/2023   Procedure: REVERSE SHOULDER ARTHROPLASTY;  Surgeon: Francena Hanly, MD;  Location: [facility] ORS;  Service: Orthopedics;  Laterality: Right;   TONSILLECTOMY     TOTAL KNEE ARTHROPLASTY Right  07/26/2018   Procedure: TOTAL KNEE ARTHROPLASTY;  Surgeon: Durene Romans, MD;  Location: [facility] ORS;  Service: Orthopedics;  Laterality: Right;  70 mins   TOTAL KNEE ARTHROPLASTY Left 12/02/2021   Procedure: TOTAL KNEE ARTHROPLASTY;  Surgeon: Durene Romans, MD;  Location: [facility] ORS;  Service: Orthopedics;  Laterality: Left;   TOTAL MASTECTOMY Bilateral 05/23/2013   Procedure: TOTAL MASTECTOMY;  Surgeon: Adolph Pollack, MD;  Location: [facility] OR;  Service: General;  Laterality: Bilateral;   Patient Active Problem List   Diagnosis Date Noted   CAP (community acquired pneumonia) 05/18/2023   Hypokalemia 05/18/2023   Anemia 05/18/2023   Acute hypoxemic respiratory failure (HCC) 05/17/2023   S/P reverse total shoulder arthroplasty, right 05/13/2023   Fever of unknown origin 06/10/2022   Stage 3b chronic kidney disease (HCC) 06/10/2022   ARF (acute renal failure) (HCC) 12/30/2021   DOE (dyspnea on exertion) 12/29/2021   S/P total knee arthroplasty, left 12/02/2021   Preoperative cardiovascular examination 11/18/2021   REM behavioral disorder 06/12/2021   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   CPAP (continuous positive airway pressure) dependence 01/20/2021   Unsteady gait when walking 01/20/2021   Complaint related to dreams 06/05/2019   Metabolic acidosis with increased anion gap and accumulation of organic acids 08/17/2018   Nausea, vomiting, and diarrhea 08/17/2018   AKI (acute kidney injury) (HCC) 08/17/2018   Overweight (BMI 25.0-29.9) 07/27/2018   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/27/2018   S/P right TKA 07/26/2018   S/P knee replacement 07/26/2018   UARS (upper airway resistance syndrome) 05/30/2018   Hyperlipidemia LDL goal <70 05/02/2018   Leg injury, right, initial encounter 01/06/2017   Contusion of right lower leg 01/06/2017   Hypoxemia 12/23/2015   Statin myopathy 05/20/2015   MCI (mild cognitive impairment) 05/20/2015   Edema  05/17/2015   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   Breast cancer genetic susceptibility 04/02/2015   OSA on CPAP 04/02/2015   Coronary artery disease involving native coronary artery with angina pectoris (HCC) 01/01/2014   Chest pain 01/01/2014   Essential hypertension    Ventricular tachycardia, non-sustained (HCC)    Breast cancer of upper-outer quadrant of right female breast (HCC) 06/28/2013   Postoperative visit 06/02/2013   Gout    Tachycardia     PCP: Alysia Penna, MD   REFERRING PROVIDER: Francena Hanly, MD  REFERRING DIAG: [phone] presence of Rt artificial joint  THERAPY DIAG:  Acute pain of right shoulder  Stiffness of right shoulder, not elsewhere classified  Muscle weakness (generalized)  Rationale for Evaluation and Treatment: Rehabilitation  ONSET DATE: Reverse total shoulder replacement 05/13/23  SUBJECTIVE:  SUBJECTIVE STATEMENT: My neck is still hurting.  My exercises are going ok.   Hand dominance: Right  PERTINENT HISTORY: Rt reverse total shoulder replacement 04/2023, Breast cancer 2014   PAIN:  Are you having pain? Yes: NPRS scale:5 /10 (neck), 5/10 Rt shoulder  Pain location: neck, Rt shoulder  Pain description: shoulder aching  Aggravating factors: trying to use Rt shoulder Relieving factors: pain medication, ice  PRECAUTIONS: Other: No arm bike due to lymph node removal in 2014  Follow protocol- scanned in media  RED FLAGS: None   WEIGHT BEARING RESTRICTIONS: No  FALLS:  Has patient fallen in last 6 months? No  LIVING ENVIRONMENT: Lives with: lives with their spouse Lives in: House/apartment  OCCUPATION: Retired   PLOF: Independent and Leisure: golf  PATIENT GOALS: Rt shoulder strength, ROM, return to functional use  NEXT MD VISIT: 6 months?    OBJECTIVE:  Note: Objective measures were completed at Evaluation unless otherwise noted.  DIAGNOSTIC FINDINGS:  None   PATIENT SURVEYS :  06/03/23: FOTO 40 (goal is 42)  COGNITION: Overall cognitive status: Within functional limits for tasks assessed     SENSATION: WFL  POSTURE: Forward head   UPPER EXTREMITY ROM:   Passive ROM Right eval Left eval  Shoulder flexion 85 with pain Full throughout   Shoulder extension    Shoulder abduction 60- pain, protocol limit   Shoulder adduction    Shoulder internal rotation    Shoulder external rotation 20 degrees-Pain   Elbow flexion full   Elbow extension Full    Wrist flexion    Wrist extension    Wrist ulnar deviation    Wrist radial deviation    Wrist pronation    Wrist supination    (Blank rows = not tested)  UPPER EXTREMITY MMT: Not tested due to post-op   JOINT MOBILITY TESTING:  NA  PALPATION:  Tension and palpable tenderness over Rt neck, upper trap and globally about the Rt shoulder.  Well healing surgical incision with reduced mobility.    TODAY'S TREATMENT:    DATE: 06/07/23 Manual:  P/ROM into ER, abduction and flexion to protocol limits Elongation to Rt upper traps  Shoulder abduction isometrics 5" hold x10  Shoulder pulleys: flexion and scaption to 90 degrees- issued info on pulleys for home use and added to HEP.  Rt upper trap stretch 3x20 seconds                                                                                                                                    DATE: 06/03/23 HEP established-see below  PATIENT EDUCATION: Education details: Access Code: [email or web address], protocol limits and protection phase of rehab Person educated: Patient Education method: Explanation, Demonstration, and Handouts Education comprehension: verbalized understanding and returned demonstration  HOME EXERCISE PROGRAM: Access Code: [email or web address] URL: [email or web address] Date:  06/08/2023 Prepared by: Tresa Endo  Exercises - Seated Gripping Towel  - 3 x daily -  7 x weekly - 1 sets - 10 reps - 5shoulder ab hold - Isometric Shoulder Abduction at Wall  - 3 x daily - 7 x weekly - 1 sets - 10 reps - 5 hold - Seated Elbow Flexion and Extension AROM  - 3 x daily - 7 x weekly - 1-2 sets - 10 reps - Supine Shoulder Flexion PROM with Caregiver  - 1 x daily - 7 x weekly - 3 sets - 10 reps - Supine Shoulder Flexion AAROM with Hands Clasped  - 3 x daily - 7 x weekly - 1 sets - 10 reps - 5 hold - Seated Gentle Upper Trapezius Stretch  - 3 x daily - 7 x weekly - 1 sets - 3 reps - 20 hold - Seated Cervical Rotation AROM  - 3 x daily - 7 x weekly - 1 sets - 3 reps - 20 hold - Seated Shoulder Flexion AAROM with Pulley Behind  - 3 x daily - 7 x weekly - 1 sets - 10 reps - Seated Shoulder Scaption AAROM with Pulley at Side  - 1 x daily - 7 x weekly - 3 sets - 10 reps  ASSESSMENT:  CLINICAL IMPRESSION: First time follow-up after evaluation.   Pt with Rt shoulder P/ROM that is limited by protocol.  Pt tolerated P/ROM well today within protocol limits and did well with advancement to pulleys for flexion and scaption.  Limit for flexion is 90 degrees until 06/10/23 and she didn't have pain with this limit.  Pt with continued Rt neck pain and requires tactile and verbal cues for scapular depression throughout session.  HEP updated today.   Pain in neck and shoulder resolved at end of session. Patient will benefit from skilled PT to address the below impairments and improve overall function.    OBJECTIVE IMPAIRMENTS: decreased activity tolerance, decreased ROM, decreased strength, increased muscle spasms, impaired flexibility, impaired UE functional use, postural dysfunction, and pain.   ACTIVITY LIMITATIONS: carrying, lifting, dressing, self feeding, and reach over head  PARTICIPATION LIMITATIONS: meal prep, cleaning, laundry, shopping, and community activity  PERSONAL FACTORS: 1 comorbidity:  Rt total shoulder replacement   are also affecting patient's functional outcome.   REHAB POTENTIAL: Good  CLINICAL DECISION MAKING: Stable/uncomplicated  EVALUATION COMPLEXITY: Low  GOALS: Goals reviewed with patient? Yes  SHORT TERM GOALS: Target date: 07/01/2023    Be independent in initial HEP Baseline: Goal status: INITIAL  2.  Report > or = to 30% use of Rt UE with light home and self-care tasks due to improve ROM and strength  Baseline:  Goal status: INITIAL  3.  Demonstrate Rt shoulder P/ROM flexion to > or = to 110 degrees to improve reaching  Baseline: 85 Goal status: INITIAL  4.  Perform Rt shoulder ER to back of head to improve independence with self-care Baseline: 20 Goal status: INITIAL  LONG TERM GOALS: Target date: 07/29/2023    Be independent in advanced HEP Baseline:  Goal status: INITIAL  2.  Improve FOTO > or = to 59 to improve functional use of Rt UE Baseline: 40 Goal status: INITIAL  3. Improve Rt shoulder A/ROM flexion to > or = to 115 degrees to allow for reaching overhead into cabinets Baseline:  Goal status: INITIAL  4.  Report > or = to 70% of normal use of Rt UE for ADLs due to improved functional strength and A/ROM Baseline:  Goal status: INITIAL  5.  Demonstrate Rt shoulder A/ROM ER to T1 to  improve self care Baseline:  Goal status: INITIAL  6.  Perform golf swing (chipping motion) without significant pain of substitution in Rt UE Baseline:  Goal status: INITIAL  PLAN: PT FREQUENCY: 2x/week  PT DURATION: 8 weeks  PLANNED INTERVENTIONS: 97110-Therapeutic exercises, 97530- Therapeutic activity, [id number]- Neuromuscular re-education, 97535- Self Care, [id number]- Manual therapy, [phone]- Canalith repositioning, [id number]- Aquatic Therapy, [id number]- Electrical stimulation (unattended), [phone]- Ionotophoresis 4mg /ml Dexamethasone, Patient/Family education, Taping, Dry Needling, Joint mobilization, Spinal mobilization, Vestibular training,  Cryotherapy, and Moist heat  PLAN FOR NEXT SESSION: move to next phase of protocol 06/10/23, follow post-op protocol, review HEP, see if pt got pulleys for home and review.    Lorrene Reid, PT 06/08/23 12:37 PM   [facility] [address] [place], [place] [address] Phone # [phone] Fax [phone]

## 2023-06-08 NOTE — ED Notes (Signed)
Pt provided AVS. Registration to schedule an appointment for pt. No complications/reaction.

## 2023-06-09 DIAGNOSIS — M353 Polymyalgia rheumatica: Secondary | ICD-10-CM | POA: Diagnosis not present

## 2023-06-09 DIAGNOSIS — M791 Myalgia, unspecified site: Secondary | ICD-10-CM | POA: Diagnosis not present

## 2023-06-09 DIAGNOSIS — R768 Other specified abnormal immunological findings in serum: Secondary | ICD-10-CM | POA: Diagnosis not present

## 2023-06-09 DIAGNOSIS — M1990 Unspecified osteoarthritis, unspecified site: Secondary | ICD-10-CM | POA: Diagnosis not present

## 2023-06-10 ENCOUNTER — Ambulatory Visit: Payer: Medicare Other

## 2023-06-10 DIAGNOSIS — M25611 Stiffness of right shoulder, not elsewhere classified: Secondary | ICD-10-CM

## 2023-06-10 DIAGNOSIS — L218 Other seborrheic dermatitis: Secondary | ICD-10-CM | POA: Diagnosis not present

## 2023-06-10 DIAGNOSIS — M25511 Pain in right shoulder: Secondary | ICD-10-CM | POA: Diagnosis not present

## 2023-06-10 DIAGNOSIS — M6281 Muscle weakness (generalized): Secondary | ICD-10-CM

## 2023-06-10 DIAGNOSIS — L570 Actinic keratosis: Secondary | ICD-10-CM | POA: Diagnosis not present

## 2023-06-10 DIAGNOSIS — L720 Epidermal cyst: Secondary | ICD-10-CM | POA: Diagnosis not present

## 2023-06-10 NOTE — Therapy (Signed)
OUTPATIENT PHYSICAL THERAPY TREATMENT   Patient Name: Tracey [name] MRN: [id number] DOB:14-Sep-1945, 77 y.o., female Today's Date: 06/10/2023  END OF SESSION:  PT End of Session - 06/10/23 1146     Visit Number 3    Date for PT Re-Evaluation 07/29/23    Authorization Type Medicare-KX at 15    Progress Note Due on Visit 10    PT Start Time 1100    PT Stop Time 1148    PT Time Calculation (min) 48 min    Activity Tolerance Patient tolerated treatment well    Behavior During Therapy [facility] for tasks assessed/performed               Past Medical History:  Diagnosis Date   Allergy    Anxiety    takes Ativan daily prn   Arthritis    Breast cancer (HCC) 2014   ER+/PR+/HEr2-,    Bruises easily    Colon polyps    2 polyps by report   Coronary artery disease involving native coronary artery of native heart without angina pectoris 01/01/2014   NSTEMI in January 2019 in [place]>> s/p DES x2 to the LAD (procedure complicated by stent thrombosis) Myoview 05/2020: EF 72, no infarct or ischemia; low risk Echocardiogram 05/2019: EF 60-65, GLS -21.5 Cardiac catheterization at [facility] in 08/2017: Normal left main, normal LCx, normal RCA; LAD/diagonal 95%>> PCI    Diverticulosis    GERD (gastroesophageal reflux disease)    occasionally takes Nexium    Gout    Headache    MIgraines- no headaches- has floater in eyes   History of bladder infections    many yrs ago   History of bronchitis    last time at least 13yrs ago   History of hiatal hernia    History of stress incontinence    Hyperlipidemia    takes Pravastatin daily   Hypertension    Insomnia    takes Ambien nightly prn   NSTEMI (non-ST elevated myocardial infarction) (HCC) 08/2017   OSA (obstructive sleep apnea)    on CPAP   Osteoarthritis    Peripheral edema    takes Furosemide daily prn   PONV (postoperative nausea and vomiting)    Postmenopausal hormone therapy    Radiation 07/27/13-09/07/13    Right Breast x 31 treatments   Rhinitis    uses Flonase prn   Sinus congestion    Status post breast reconstruction 10/15/2014   Bilateral implant removal and DIEP performed in [place], [place]   Tachycardia    takes Metoprolol daily   Ventricular tachycardia, non-sustained (HCC)    during sleep study 2009 with normal cardiac workup   Past Surgical History:  Procedure Laterality Date   ABDOMINAL HYSTERECTOMY     with BSO   APPENDECTOMY     ARTERY BIOPSY Left 05/15/2022   Procedure: BIOPSY TEMPORAL ARTERY;  Surgeon: Cephus Shelling, MD;  Location: [facility] OR;  Service: Vascular;  Laterality: Left;   AXILLARY SENTINEL NODE BIOPSY Right 05/23/2013   Procedure: AXILLARY SENTINEL NODE BIOPSY;  Surgeon: Adolph Pollack, MD;  Location: [facility] OR;  Service: General;  Laterality: Right;  nuc med injection 7:00   BREAST RECONSTRUCTION WITH PLACEMENT OF TISSUE EXPANDER AND FLEX HD (ACELLULAR HYDRATED DERMIS) Bilateral 05/23/2013   Procedure: BILATERAL BREAST RECONSTRUCTION WITH PLACEMENT OF TISSUE EXPANDER AND FLEX HD;  Surgeon: Etter Sjogren, MD;  Location: [facility] OR;  Service: Plastics;  Laterality: Bilateral;   CARDIAC CATHETERIZATION  1999  CATARACT EXTRACTION, BILATERAL Bilateral    CHOLECYSTECTOMY     COSMETIC SURGERY     OTHER SURGICAL HISTORY  09/2017   had cyst removal from spine    PERCUTANEOUS CORONARY STENT INTERVENTION (PCI-S)  08/2017   done in [place]     REMOVAL OF BILATERAL TISSUE EXPANDERS WITH PLACEMENT OF BILATERAL BREAST IMPLANTS Bilateral 05/17/2014   Procedure: REMOVAL OF BILATERAL TISSUE EXPANDERS WITH PLACEMENT OF BILATERAL BREAST IMPLANTS FOR RECONSTRUCTION;  Surgeon: Etter Sjogren, MD;  Location: [facility] OR;  Service: Plastics;  Laterality: Bilateral;   REVERSE SHOULDER ARTHROPLASTY Right 05/13/2023   Procedure: REVERSE SHOULDER ARTHROPLASTY;  Surgeon: Francena Hanly, MD;  Location: [facility] ORS;  Service: Orthopedics;  Laterality: Right;   TONSILLECTOMY     TOTAL KNEE ARTHROPLASTY Right  07/26/2018   Procedure: TOTAL KNEE ARTHROPLASTY;  Surgeon: Durene Romans, MD;  Location: [facility] ORS;  Service: Orthopedics;  Laterality: Right;  70 mins   TOTAL KNEE ARTHROPLASTY Left 12/02/2021   Procedure: TOTAL KNEE ARTHROPLASTY;  Surgeon: Durene Romans, MD;  Location: [facility] ORS;  Service: Orthopedics;  Laterality: Left;   TOTAL MASTECTOMY Bilateral 05/23/2013   Procedure: TOTAL MASTECTOMY;  Surgeon: Adolph Pollack, MD;  Location: [facility] OR;  Service: General;  Laterality: Bilateral;   Patient Active Problem List   Diagnosis Date Noted   CAP (community acquired pneumonia) 05/18/2023   Hypokalemia 05/18/2023   Anemia 05/18/2023   Acute hypoxemic respiratory failure (HCC) 05/17/2023   S/P reverse total shoulder arthroplasty, right 05/13/2023   Fever of unknown origin 06/10/2022   Stage 3b chronic kidney disease (HCC) 06/10/2022   ARF (acute renal failure) (HCC) 12/30/2021   DOE (dyspnea on exertion) 12/29/2021   S/P total knee arthroplasty, left 12/02/2021   Preoperative cardiovascular examination 11/18/2021   REM behavioral disorder 06/12/2021   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   CPAP (continuous positive airway pressure) dependence 01/20/2021   Unsteady gait when walking 01/20/2021   Complaint related to dreams 06/05/2019   Metabolic acidosis with increased anion gap and accumulation of organic acids 08/17/2018   Nausea, vomiting, and diarrhea 08/17/2018   AKI (acute kidney injury) (HCC) 08/17/2018   Overweight (BMI 25.0-29.9) 07/27/2018   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/27/2018   S/P right TKA 07/26/2018   S/P knee replacement 07/26/2018   UARS (upper airway resistance syndrome) 05/30/2018   Hyperlipidemia LDL goal <70 05/02/2018   Leg injury, right, initial encounter 01/06/2017   Contusion of right lower leg 01/06/2017   Hypoxemia 12/23/2015   Statin myopathy 05/20/2015   MCI (mild cognitive impairment) 05/20/2015   Edema  05/17/2015   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   Breast cancer genetic susceptibility 04/02/2015   OSA on CPAP 04/02/2015   Coronary artery disease involving native coronary artery with angina pectoris (HCC) 01/01/2014   Chest pain 01/01/2014   Essential hypertension    Ventricular tachycardia, non-sustained (HCC)    Breast cancer of upper-outer quadrant of right female breast (HCC) 06/28/2013   Postoperative visit 06/02/2013   Gout    Tachycardia     PCP: Alysia Penna, MD   REFERRING PROVIDER: Francena Hanly, MD  REFERRING DIAG: [phone] presence of Rt artificial joint  THERAPY DIAG:  Acute pain of right shoulder  Stiffness of right shoulder, not elsewhere classified  Muscle weakness (generalized)  Rationale for Evaluation and Treatment: Rehabilitation  ONSET DATE: Reverse total shoulder replacement 05/13/23  SUBJECTIVE:  SUBJECTIVE STATEMENT: Patient states she is doing pretty good but that her right upper trap is still sore. Hasn't gotten pulleys yet.  Hand dominance: Right  PERTINENT HISTORY: Rt reverse total shoulder replacement 04/2023, Breast cancer 2014   PAIN:  Are you having pain? Yes: NPRS scale:5 /10 (neck), 5/10 Rt shoulder  Pain location: neck, Rt shoulder  Pain description: shoulder aching  Aggravating factors: trying to use Rt shoulder Relieving factors: pain medication, ice  PRECAUTIONS: Other: No arm bike due to lymph node removal in 2014  Follow protocol- scanned in media  RED FLAGS: None   WEIGHT BEARING RESTRICTIONS: No  FALLS:  Has patient fallen in last 6 months? No  LIVING ENVIRONMENT: Lives with: lives with their spouse Lives in: House/apartment  OCCUPATION: Retired   PLOF: Independent and Leisure: golf  PATIENT GOALS: Rt shoulder strength,  ROM, return to functional use  NEXT MD VISIT: 6 months?   OBJECTIVE:  Note: Objective measures were completed at Evaluation unless otherwise noted.  DIAGNOSTIC FINDINGS:  None   PATIENT SURVEYS :  06/03/23: FOTO 40 (goal is 19)  COGNITION: Overall cognitive status: Within functional limits for tasks assessed     SENSATION: WFL  POSTURE: Forward head   UPPER EXTREMITY ROM:   Passive ROM Right eval Left eval  Shoulder flexion 85 with pain Full throughout   Shoulder extension    Shoulder abduction 60- pain, protocol limit   Shoulder adduction    Shoulder internal rotation    Shoulder external rotation 20 degrees-Pain   Elbow flexion full   Elbow extension Full    Wrist flexion    Wrist extension    Wrist ulnar deviation    Wrist radial deviation    Wrist pronation    Wrist supination    (Blank rows = not tested)  UPPER EXTREMITY MMT: Not tested due to post-op   JOINT MOBILITY TESTING:  NA  PALPATION:  Tension and palpable tenderness over Rt neck, upper trap and globally about the Rt shoulder.  Well healing surgical incision with reduced mobility.    TODAY'S TREATMENT:    DATE: 06/10/23 Pendulums cw,ccw, fwd/back, side/side Added isometric right shoulder abduction and ER x 5 holding 10 sec each Upper cut 2 x 10, hitchhiker 2 x 10 Pulleys: flexion to 90, scaption to 90 x 20 each (explained to patient that her protocol opens up to ROM to tolerance on the pulleys but to avoid being overly aggressive) Manual:  P/ROM into ER, abduction and flexion to protocol limits STM to right upper trap Ice to right shoulder post treatment x 10 min in hook lying with bolster under knees and yoga block under elbow  DATE: 06/07/23 Manual:  P/ROM into ER, abduction and flexion to protocol limits Elongation to Rt upper traps  Shoulder abduction isometrics 5" hold x10  Shoulder pulleys: flexion and scaption to 90 degrees- issued info on pulleys for home use and added to HEP.   Rt upper trap stretch 3x20 seconds  DATE: 06/03/23 HEP established-see below  PATIENT EDUCATION: Education details: Access Code: [email or web address], protocol limits and protection phase of rehab Person educated: Patient Education method: Explanation, Demonstration, and Handouts Education comprehension: verbalized understanding and returned demonstration  HOME EXERCISE PROGRAM: Access Code: [email or web address] URL: [email or web address] Date: 06/08/2023 Prepared by: Tresa Endo  Exercises - Seated Gripping Towel  - 3 x daily - 7 x weekly - 1 sets - 10 reps - 5shoulder ab hold - Isometric Shoulder Abduction at Wall  - 3 x daily - 7 x weekly - 1 sets - 10 reps - 5 hold - Seated Elbow Flexion and Extension AROM  - 3 x daily - 7 x weekly - 1-2 sets - 10 reps - Supine Shoulder Flexion PROM with Caregiver  - 1 x daily - 7 x weekly - 3 sets - 10 reps - Supine Shoulder Flexion AAROM with Hands Clasped  - 3 x daily - 7 x weekly - 1 sets - 10 reps - 5 hold - Seated Gentle Upper Trapezius Stretch  - 3 x daily - 7 x weekly - 1 sets - 3 reps - 20 hold - Seated Cervical Rotation AROM  - 3 x daily - 7 x weekly - 1 sets - 3 reps - 20 hold - Seated Shoulder Flexion AAROM with Pulley Behind  - 3 x daily - 7 x weekly - 1 sets - 10 reps - Seated Shoulder Scaption AAROM with Pulley at Side  - 1 x daily - 7 x weekly - 3 sets - 10 reps  ASSESSMENT:  CLINICAL IMPRESSION: [place] is progressing appropriately.  She was able to tolerate slightly past 90 degrees flexion and abduction along with ER to approx 45 degrees with minimal pain or guarding.  She did not receive her pulleys yet.  We discussed relaxing her shoulders downward and being intentional with this daily.  She should continue to do well.  Patient will benefit from skilled PT to address the below impairments and improve overall function.     OBJECTIVE IMPAIRMENTS: decreased activity tolerance, decreased ROM, decreased strength, increased muscle spasms, impaired flexibility, impaired UE functional use, postural dysfunction, and pain.   ACTIVITY LIMITATIONS: carrying, lifting, dressing, self feeding, and reach over head  PARTICIPATION LIMITATIONS: meal prep, cleaning, laundry, shopping, and community activity  PERSONAL FACTORS: 1 comorbidity: Rt total shoulder replacement   are also affecting patient's functional outcome.   REHAB POTENTIAL: Good  CLINICAL DECISION MAKING: Stable/uncomplicated  EVALUATION COMPLEXITY: Low  GOALS: Goals reviewed with patient? Yes  SHORT TERM GOALS: Target date: 07/01/2023    Be independent in initial HEP Baseline: Goal status: MET  2.  Report > or = to 30% use of Rt UE with light home and self-care tasks due to improve ROM and strength  Baseline:  Goal status: INITIAL  3.  Demonstrate Rt shoulder P/ROM flexion to > or = to 110 degrees to improve reaching  Baseline: 85 Goal status: INITIAL  4.  Perform Rt shoulder ER to back of head to improve independence with self-care Baseline: 20 Goal status: INITIAL  LONG TERM GOALS: Target date: 07/29/2023    Be independent in advanced HEP Baseline:  Goal status: INITIAL  2.  Improve FOTO > or = to 59 to improve functional use of Rt UE Baseline: 40 Goal status: INITIAL  3. Improve Rt shoulder A/ROM flexion to > or = to 115 degrees to allow for reaching overhead into cabinets Baseline:  Goal status: INITIAL  4.  Report > or = to  70% of normal use of Rt UE for ADLs due to improved functional strength and A/ROM Baseline:  Goal status: INITIAL  5.  Demonstrate Rt shoulder A/ROM ER to T1 to improve self care Baseline:  Goal status: INITIAL  6.  Perform golf swing (chipping motion) without significant pain of substitution in Rt UE Baseline:  Goal status: INITIAL  PLAN: PT FREQUENCY: 2x/week  PT DURATION: 8  weeks  PLANNED INTERVENTIONS: 97110-Therapeutic exercises, 97530- Therapeutic activity, [id number]- Neuromuscular re-education, 97535- Self Care, [id number]- Manual therapy, [phone]- Canalith repositioning, [id number]- Aquatic Therapy, 97014- Electrical stimulation (unattended), [phone]- Ionotophoresis 4mg /ml Dexamethasone, Patient/Family education, Taping, Dry Needling, Joint mobilization, Spinal mobilization, Vestibular training, Cryotherapy, and Moist heat  PLAN FOR NEXT SESSION: DN to right upper trap, move to next phase of protocol, follow post-op protocol, review HEP, see if pt got pulleys for home and review.    Victorino Dike B. Dyllen Menning, PT 06/10/23 11:50 AM [facility] [address], [address] [place], [place] [address] Phone # [phone] Fax [phone]

## 2023-06-14 ENCOUNTER — Ambulatory Visit (HOSPITAL_COMMUNITY)
Admission: RE | Admit: 2023-06-14 | Discharge: 2023-06-14 | Disposition: A | Discharge status: Home / Self Care | Payer: Medicare Other | Source: Ambulatory Visit | Attending: Emergency Medicine | Admitting: Emergency Medicine

## 2023-06-14 DIAGNOSIS — Z203 Contact with and (suspected) exposure to rabies: Secondary | ICD-10-CM

## 2023-06-14 MED ORDER — RABIES VACCINE, PCEC IM SUSR
1.0000 mL | Freq: Once | INTRAMUSCULAR | Status: AC
  Administered 2023-06-14: 1 mL via INTRAMUSCULAR

## 2023-06-14 MED ORDER — RABIES VACCINE, PCEC IM SUSR
INTRAMUSCULAR | Status: AC
  Filled 2023-06-14: qty 1

## 2023-06-14 NOTE — ED Triage Notes (Signed)
Pt here for 4th and final rabies shot. Denies any complications.

## 2023-06-15 ENCOUNTER — Ambulatory Visit: Payer: Medicare Other

## 2023-06-15 DIAGNOSIS — M25511 Pain in right shoulder: Secondary | ICD-10-CM

## 2023-06-15 DIAGNOSIS — M25611 Stiffness of right shoulder, not elsewhere classified: Secondary | ICD-10-CM | POA: Diagnosis not present

## 2023-06-15 DIAGNOSIS — M6281 Muscle weakness (generalized): Secondary | ICD-10-CM | POA: Diagnosis not present

## 2023-06-15 NOTE — Patient Instructions (Signed)

## 2023-06-15 NOTE — Therapy (Signed)
OUTPATIENT PHYSICAL THERAPY TREATMENT   Patient Name: Tracey [name] MRN: [id number] DOB:Nov 05, 1945, 77 y.o., female Today's Date: 06/15/2023  END OF SESSION:  PT End of Session - 06/15/23 1149     Visit Number 4    Date for PT Re-Evaluation 07/29/23    Authorization Type Medicare-KX at 15    Progress Note Due on Visit 10    PT Start Time 1101    PT Stop Time 1148    PT Time Calculation (min) 47 min    Activity Tolerance Patient tolerated treatment well    Behavior During Therapy [facility] for tasks assessed/performed                Past Medical History:  Diagnosis Date   Allergy    Anxiety    takes Ativan daily prn   Arthritis    Breast cancer (HCC) 2014   ER+/PR+/HEr2-,    Bruises easily    Colon polyps    2 polyps by report   Coronary artery disease involving native coronary artery of native heart without angina pectoris 01/01/2014   NSTEMI in January 2019 in [place]>> s/p DES x2 to the LAD (procedure complicated by stent thrombosis) Myoview 05/2020: EF 72, no infarct or ischemia; low risk Echocardiogram 05/2019: EF 60-65, GLS -21.5 Cardiac catheterization at [facility] in 08/2017: Normal left main, normal LCx, normal RCA; LAD/diagonal 95%>> PCI    Diverticulosis    GERD (gastroesophageal reflux disease)    occasionally takes Nexium    Gout    Headache    MIgraines- no headaches- has floater in eyes   History of bladder infections    many yrs ago   History of bronchitis    last time at least 14yrs ago   History of hiatal hernia    History of stress incontinence    Hyperlipidemia    takes Pravastatin daily   Hypertension    Insomnia    takes Ambien nightly prn   NSTEMI (non-ST elevated myocardial infarction) (HCC) 08/2017   OSA (obstructive sleep apnea)    on CPAP   Osteoarthritis    Peripheral edema    takes Furosemide daily prn   PONV (postoperative nausea and vomiting)    Postmenopausal hormone therapy    Radiation  07/27/13-09/07/13   Right Breast x 31 treatments   Rhinitis    uses Flonase prn   Sinus congestion    Status post breast reconstruction 10/15/2014   Bilateral implant removal and DIEP performed in [place], [place]   Tachycardia    takes Metoprolol daily   Ventricular tachycardia, non-sustained (HCC)    during sleep study 2009 with normal cardiac workup   Past Surgical History:  Procedure Laterality Date   ABDOMINAL HYSTERECTOMY     with BSO   APPENDECTOMY     ARTERY BIOPSY Left 05/15/2022   Procedure: BIOPSY TEMPORAL ARTERY;  Surgeon: Cephus Shelling, MD;  Location: [facility] OR;  Service: Vascular;  Laterality: Left;   AXILLARY SENTINEL NODE BIOPSY Right 05/23/2013   Procedure: AXILLARY SENTINEL NODE BIOPSY;  Surgeon: Adolph Pollack, MD;  Location: [facility] OR;  Service: General;  Laterality: Right;  nuc med injection 7:00   BREAST RECONSTRUCTION WITH PLACEMENT OF TISSUE EXPANDER AND FLEX HD (ACELLULAR HYDRATED DERMIS) Bilateral 05/23/2013   Procedure: BILATERAL BREAST RECONSTRUCTION WITH PLACEMENT OF TISSUE EXPANDER AND FLEX HD;  Surgeon: Etter Sjogren, MD;  Location: [facility] OR;  Service: Plastics;  Laterality: Bilateral;   CARDIAC CATHETERIZATION  1999  CATARACT EXTRACTION, BILATERAL Bilateral    CHOLECYSTECTOMY     COSMETIC SURGERY     OTHER SURGICAL HISTORY  09/2017   had cyst removal from spine    PERCUTANEOUS CORONARY STENT INTERVENTION (PCI-S)  08/2017   done in [place]     REMOVAL OF BILATERAL TISSUE EXPANDERS WITH PLACEMENT OF BILATERAL BREAST IMPLANTS Bilateral 05/17/2014   Procedure: REMOVAL OF BILATERAL TISSUE EXPANDERS WITH PLACEMENT OF BILATERAL BREAST IMPLANTS FOR RECONSTRUCTION;  Surgeon: Etter Sjogren, MD;  Location: [facility] OR;  Service: Plastics;  Laterality: Bilateral;   REVERSE SHOULDER ARTHROPLASTY Right 05/13/2023   Procedure: REVERSE SHOULDER ARTHROPLASTY;  Surgeon: Francena Hanly, MD;  Location: [facility] ORS;  Service: Orthopedics;  Laterality: Right;   TONSILLECTOMY     TOTAL KNEE  ARTHROPLASTY Right 07/26/2018   Procedure: TOTAL KNEE ARTHROPLASTY;  Surgeon: Durene Romans, MD;  Location: [facility] ORS;  Service: Orthopedics;  Laterality: Right;  70 mins   TOTAL KNEE ARTHROPLASTY Left 12/02/2021   Procedure: TOTAL KNEE ARTHROPLASTY;  Surgeon: Durene Romans, MD;  Location: [facility] ORS;  Service: Orthopedics;  Laterality: Left;   TOTAL MASTECTOMY Bilateral 05/23/2013   Procedure: TOTAL MASTECTOMY;  Surgeon: Adolph Pollack, MD;  Location: [facility] OR;  Service: General;  Laterality: Bilateral;   Patient Active Problem List   Diagnosis Date Noted   CAP (community acquired pneumonia) 05/18/2023   Hypokalemia 05/18/2023   Anemia 05/18/2023   Acute hypoxemic respiratory failure (HCC) 05/17/2023   S/P reverse total shoulder arthroplasty, right 05/13/2023   Fever of unknown origin 06/10/2022   Stage 3b chronic kidney disease (HCC) 06/10/2022   ARF (acute renal failure) (HCC) 12/30/2021   DOE (dyspnea on exertion) 12/29/2021   S/P total knee arthroplasty, left 12/02/2021   Preoperative cardiovascular examination 11/18/2021   REM behavioral disorder 06/12/2021   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   CPAP (continuous positive airway pressure) dependence 01/20/2021   Unsteady gait when walking 01/20/2021   Complaint related to dreams 06/05/2019   Metabolic acidosis with increased anion gap and accumulation of organic acids 08/17/2018   Nausea, vomiting, and diarrhea 08/17/2018   AKI (acute kidney injury) (HCC) 08/17/2018   Overweight (BMI 25.0-29.9) 07/27/2018   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/27/2018   S/P right TKA 07/26/2018   S/P knee replacement 07/26/2018   UARS (upper airway resistance syndrome) 05/30/2018   Hyperlipidemia LDL goal <70 05/02/2018   Leg injury, right, initial encounter 01/06/2017   Contusion of right lower leg 01/06/2017   Hypoxemia 12/23/2015   Statin myopathy 05/20/2015   MCI (mild cognitive impairment)  05/20/2015   Edema 05/17/2015   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   Breast cancer genetic susceptibility 04/02/2015   OSA on CPAP 04/02/2015   Coronary artery disease involving native coronary artery with angina pectoris (HCC) 01/01/2014   Chest pain 01/01/2014   Essential hypertension    Ventricular tachycardia, non-sustained (HCC)    Breast cancer of upper-outer quadrant of right female breast (HCC) 06/28/2013   Postoperative visit 06/02/2013   Gout    Tachycardia     PCP: Alysia Penna, MD   REFERRING PROVIDER: Francena Hanly, MD  REFERRING DIAG: [id number] presence of Rt artificial joint  THERAPY DIAG:  Acute pain of right shoulder  Stiffness of right shoulder, not elsewhere classified  Muscle weakness (generalized)  Rationale for Evaluation and Treatment: Rehabilitation  ONSET DATE: Reverse total shoulder replacement 05/13/23  SUBJECTIVE:  SUBJECTIVE STATEMENT: I have pulleys at home. I've been doing them multiple times a day.  Hand dominance: Right  PERTINENT HISTORY: Rt reverse total shoulder replacement 04/2023, Breast cancer 2014   PAIN: 06/15/23 Are you having pain? Yes: NPRS scale:5 /10 (neck), 5/10 Rt shoulder  Pain location: neck, Rt shoulder  Pain description: shoulder aching  Aggravating factors: trying to use Rt shoulder Relieving factors: pain medication, ice  PRECAUTIONS: Other: No arm bike due to lymph node removal in 2014  Follow protocol- scanned in media Current 06/15/23: current P/ROM limitations: ER to 45, flexion to 115, abduction 90  RED FLAGS: None   WEIGHT BEARING RESTRICTIONS: No  FALLS:  Has patient fallen in last 6 months? No  LIVING ENVIRONMENT: Lives with: lives with their spouse Lives in: House/apartment  OCCUPATION: Retired   PLOF:  Independent and Leisure: golf  PATIENT GOALS: Rt shoulder strength, ROM, return to functional use  NEXT MD VISIT: 6 months?   OBJECTIVE:  Note: Objective measures were completed at Evaluation unless otherwise noted.  DIAGNOSTIC FINDINGS:  None   PATIENT SURVEYS :  06/03/23: FOTO 40 (goal is 16)  COGNITION: Overall cognitive status: Within functional limits for tasks assessed     SENSATION: WFL  POSTURE: Forward head   UPPER EXTREMITY ROM:   Passive ROM Right eval Left eval  Shoulder flexion 85 with pain Full throughout   Shoulder extension    Shoulder abduction 60- pain, protocol limit   Shoulder adduction    Shoulder internal rotation    Shoulder external rotation 20 degrees-Pain   Elbow flexion full   Elbow extension Full    Wrist flexion    Wrist extension    Wrist ulnar deviation    Wrist radial deviation    Wrist pronation    Wrist supination    (Blank rows = not tested)  UPPER EXTREMITY MMT: Not tested due to post-op   JOINT MOBILITY TESTING:  NA  PALPATION:  Tension and palpable tenderness over Rt neck, upper trap and globally about the Rt shoulder.  Well healing surgical incision with reduced mobility.    TODAY'S TREATMENT:     DATE: 06/15/23 Pendulums cw,ccw, fwd/back, side/side UE ranger: seated flexion, circles CW/CCW Upper cut 2 x 10, hitchhiker 2 x 10 Pulleys: flexion to 105, scaption to 90 x 20 each- Pt had some pain with this  Manual:  P/ROM into ER, abduction and flexion to protocol limits Trigger Point Dry-Needling  Treatment instructions: Expect mild to moderate muscle soreness. S/S of pneumothorax if dry needled over a lung field, and to seek immediate medical attention should they occur. Patient verbalized understanding of these instructions and education.  Patient Consent Given: Yes Education handout provided: Yes Muscles treated: Rt upper trap and cervical multifidi  Treatment response/outcome: Utilized skilled palpation to  identify trigger points.  During dry needling able to palpate muscle twitch and muscle elongation  Elongation and release to Rt upper trap  Skilled palpation and monitoring by PT during dry needling   DATE: 06/10/23 Pendulums cw,ccw, fwd/back, side/side Added isometric right shoulder abduction and ER x 5 holding 10 sec each Upper cut 2 x 10, hitchhiker 2 x 10 Pulleys: flexion to 90, scaption to 90 x 20 each (explained to patient that her protocol opens up to ROM to tolerance on the pulleys but to avoid being overly aggressive) Manual:  P/ROM into ER, abduction and flexion to protocol limits STM to right upper trap Ice to right shoulder post treatment x 10  min in hook lying with bolster under knees and yoga block under elbow  DATE: 06/07/23 Manual:  P/ROM into ER, abduction and flexion to protocol limits Elongation to Rt upper traps  Shoulder abduction isometrics 5" hold x10  Shoulder pulleys: flexion and scaption to 90 degrees- issued info on pulleys for home use and added to HEP.  Rt upper trap stretch 3x20 seconds                                                                                                                                     PATIENT EDUCATION: Education details: Access Code: [email or web address], protocol limits and protection phase of rehab Person educated: Patient Education method: Explanation, Demonstration, and Handouts Education comprehension: verbalized understanding and returned demonstration  HOME EXERCISE PROGRAM: Access Code: [email or web address] URL: [email or web address] Date: 06/08/2023 Prepared by: Tresa Endo  Exercises - Seated Gripping Towel  - 3 x daily - 7 x weekly - 1 sets - 10 reps - 5shoulder ab hold - Isometric Shoulder Abduction at Wall  - 3 x daily - 7 x weekly - 1 sets - 10 reps - 5 hold - Seated Elbow Flexion and Extension AROM  - 3 x daily - 7 x weekly - 1-2 sets - 10 reps - Supine Shoulder Flexion PROM with Caregiver  - 1 x daily - 7 x weekly -  3 sets - 10 reps - Supine Shoulder Flexion AAROM with Hands Clasped  - 3 x daily - 7 x weekly - 1 sets - 10 reps - 5 hold - Seated Gentle Upper Trapezius Stretch  - 3 x daily - 7 x weekly - 1 sets - 3 reps - 20 hold - Seated Cervical Rotation AROM  - 3 x daily - 7 x weekly - 1 sets - 3 reps - 20 hold - Seated Shoulder Flexion AAROM with Pulley Behind  - 3 x daily - 7 x weekly - 1 sets - 10 reps - Seated Shoulder Scaption AAROM with Pulley at Side  - 1 x daily - 7 x weekly - 3 sets - 10 reps  ASSESSMENT:  CLINICAL IMPRESSION: Pt has been trying to use her Rt UE at home to do her hair.  Pt with increased pain today in the arm and PT educated pt regarding slow and gentle return to activity.  Pt with overuse of Rt upper trap with Rt UE movement and PT provided tactile and verbal cues to relax.  P/ROM was limited due to pain today.  Pt had good response to DN to Rt UT and cervical multifidi with twitch and improved tissue mobility after. Patient will benefit from skilled PT to address the below impairments and improve overall function.    OBJECTIVE IMPAIRMENTS: decreased activity tolerance, decreased ROM, decreased strength, increased muscle spasms, impaired flexibility, impaired UE functional use, postural dysfunction, and pain.   ACTIVITY LIMITATIONS: carrying, lifting, dressing, self feeding, and reach over head  PARTICIPATION  LIMITATIONS: meal prep, cleaning, laundry, shopping, and community activity  PERSONAL FACTORS: 1 comorbidity: Rt total shoulder replacement   are also affecting patient's functional outcome.   REHAB POTENTIAL: Good  CLINICAL DECISION MAKING: Stable/uncomplicated  EVALUATION COMPLEXITY: Low  GOALS: Goals reviewed with patient? Yes  SHORT TERM GOALS: Target date: 07/01/2023    Be independent in initial HEP Baseline: Goal status: MET  2.  Report > or = to 30% use of Rt UE with light home and self-care tasks due to improve ROM and strength  Baseline:  Goal  status: INITIAL  3.  Demonstrate Rt shoulder P/ROM flexion to > or = to 110 degrees to improve reaching  Baseline: 85 Goal status: INITIAL  4.  Perform Rt shoulder ER to back of head to improve independence with self-care Baseline: 20 Goal status: INITIAL  LONG TERM GOALS: Target date: 07/29/2023    Be independent in advanced HEP Baseline:  Goal status: INITIAL  2.  Improve FOTO > or = to 59 to improve functional use of Rt UE Baseline: 40 Goal status: INITIAL  3. Improve Rt shoulder A/ROM flexion to > or = to 115 degrees to allow for reaching overhead into cabinets Baseline:  Goal status: INITIAL  4.  Report > or = to 70% of normal use of Rt UE for ADLs due to improved functional strength and A/ROM Baseline:  Goal status: INITIAL  5.  Demonstrate Rt shoulder A/ROM ER to T1 to improve self care Baseline:  Goal status: INITIAL  6.  Perform golf swing (chipping motion) without significant pain of substitution in Rt UE Baseline:  Goal status: INITIAL  PLAN: PT FREQUENCY: 2x/week  PT DURATION: 8 weeks  PLANNED INTERVENTIONS: 97110-Therapeutic exercises, 97530- Therapeutic activity, 97112- Neuromuscular re-education, 97535- Self Care, 16109- Manual therapy, [phone]- Canalith repositioning, [id number]- Aquatic Therapy, 97014- Electrical stimulation (unattended), [phone]- Ionotophoresis 4mg /ml Dexamethasone, Patient/Family education, Taping, Dry Needling, Joint mobilization, Spinal mobilization, Vestibular training, Cryotherapy, and Moist heat  PLAN FOR NEXT SESSION:see how pt responded to DN, gentle progression of Rt UE strength and ROM as indicated by post-op protocol  Lorrene Reid, PT 06/15/23 12:37 PM  [facility] [address] [place], [place] [address] Phone # [phone] Fax [phone]

## 2023-06-17 ENCOUNTER — Ambulatory Visit: Payer: Medicare Other

## 2023-06-17 DIAGNOSIS — M25511 Pain in right shoulder: Secondary | ICD-10-CM

## 2023-06-17 DIAGNOSIS — M6281 Muscle weakness (generalized): Secondary | ICD-10-CM

## 2023-06-17 DIAGNOSIS — M25611 Stiffness of right shoulder, not elsewhere classified: Secondary | ICD-10-CM | POA: Diagnosis not present

## 2023-06-17 NOTE — Therapy (Signed)
OUTPATIENT PHYSICAL THERAPY TREATMENT   Patient Name: Tracey [name] MRN: [id number] DOB:Oct 25, 1945, 77 y.o., female Today's Date: 06/17/2023  END OF SESSION:  PT End of Session - 06/17/23 1232     Visit Number 5    Date for PT Re-Evaluation 07/29/23    Authorization Type Medicare-KX at 15    Progress Note Due on Visit 10    PT Start Time 1149    PT Stop Time 1232    PT Time Calculation (min) 43 min    Activity Tolerance Patient tolerated treatment well    Behavior During Therapy [facility] for tasks assessed/performed                 Past Medical History:  Diagnosis Date   Allergy    Anxiety    takes Ativan daily prn   Arthritis    Breast cancer (HCC) 2014   ER+/PR+/HEr2-,    Bruises easily    Colon polyps    2 polyps by report   Coronary artery disease involving native coronary artery of native heart without angina pectoris 01/01/2014   NSTEMI in January 2019 in [place]>> s/p DES x2 to the LAD (procedure complicated by stent thrombosis) Myoview 05/2020: EF 72, no infarct or ischemia; low risk Echocardiogram 05/2019: EF 60-65, GLS -21.5 Cardiac catheterization at [facility] in 08/2017: Normal left main, normal LCx, normal RCA; LAD/diagonal 95%>> PCI    Diverticulosis    GERD (gastroesophageal reflux disease)    occasionally takes Nexium    Gout    Headache    MIgraines- no headaches- has floater in eyes   History of bladder infections    many yrs ago   History of bronchitis    last time at least 13yrs ago   History of hiatal hernia    History of stress incontinence    Hyperlipidemia    takes Pravastatin daily   Hypertension    Insomnia    takes Ambien nightly prn   NSTEMI (non-ST elevated myocardial infarction) (HCC) 08/2017   OSA (obstructive sleep apnea)    on CPAP   Osteoarthritis    Peripheral edema    takes Furosemide daily prn   PONV (postoperative nausea and vomiting)    Postmenopausal hormone therapy    Radiation  07/27/13-09/07/13   Right Breast x 31 treatments   Rhinitis    uses Flonase prn   Sinus congestion    Status post breast reconstruction 10/15/2014   Bilateral implant removal and DIEP performed in [place], [place]   Tachycardia    takes Metoprolol daily   Ventricular tachycardia, non-sustained (HCC)    during sleep study 2009 with normal cardiac workup   Past Surgical History:  Procedure Laterality Date   ABDOMINAL HYSTERECTOMY     with BSO   APPENDECTOMY     ARTERY BIOPSY Left 05/15/2022   Procedure: BIOPSY TEMPORAL ARTERY;  Surgeon: Cephus Shelling, MD;  Location: [facility] OR;  Service: Vascular;  Laterality: Left;   AXILLARY SENTINEL NODE BIOPSY Right 05/23/2013   Procedure: AXILLARY SENTINEL NODE BIOPSY;  Surgeon: Adolph Pollack, MD;  Location: [facility] OR;  Service: General;  Laterality: Right;  nuc med injection 7:00   BREAST RECONSTRUCTION WITH PLACEMENT OF TISSUE EXPANDER AND FLEX HD (ACELLULAR HYDRATED DERMIS) Bilateral 05/23/2013   Procedure: BILATERAL BREAST RECONSTRUCTION WITH PLACEMENT OF TISSUE EXPANDER AND FLEX HD;  Surgeon: Etter Sjogren, MD;  Location: [facility] OR;  Service: Plastics;  Laterality: Bilateral;   CARDIAC CATHETERIZATION  1999  CATARACT EXTRACTION, BILATERAL Bilateral    CHOLECYSTECTOMY     COSMETIC SURGERY     OTHER SURGICAL HISTORY  09/2017   had cyst removal from spine    PERCUTANEOUS CORONARY STENT INTERVENTION (PCI-S)  08/2017   done in [place]     REMOVAL OF BILATERAL TISSUE EXPANDERS WITH PLACEMENT OF BILATERAL BREAST IMPLANTS Bilateral 05/17/2014   Procedure: REMOVAL OF BILATERAL TISSUE EXPANDERS WITH PLACEMENT OF BILATERAL BREAST IMPLANTS FOR RECONSTRUCTION;  Surgeon: Etter Sjogren, MD;  Location: [facility] OR;  Service: Plastics;  Laterality: Bilateral;   REVERSE SHOULDER ARTHROPLASTY Right 05/13/2023   Procedure: REVERSE SHOULDER ARTHROPLASTY;  Surgeon: Francena Hanly, MD;  Location: [facility] ORS;  Service: Orthopedics;  Laterality: Right;   TONSILLECTOMY     TOTAL KNEE  ARTHROPLASTY Right 07/26/2018   Procedure: TOTAL KNEE ARTHROPLASTY;  Surgeon: Durene Romans, MD;  Location: [facility] ORS;  Service: Orthopedics;  Laterality: Right;  70 mins   TOTAL KNEE ARTHROPLASTY Left 12/02/2021   Procedure: TOTAL KNEE ARTHROPLASTY;  Surgeon: Durene Romans, MD;  Location: [facility] ORS;  Service: Orthopedics;  Laterality: Left;   TOTAL MASTECTOMY Bilateral 05/23/2013   Procedure: TOTAL MASTECTOMY;  Surgeon: Adolph Pollack, MD;  Location: [facility] OR;  Service: General;  Laterality: Bilateral;   Patient Active Problem List   Diagnosis Date Noted   CAP (community acquired pneumonia) 05/18/2023   Hypokalemia 05/18/2023   Anemia 05/18/2023   Acute hypoxemic respiratory failure (HCC) 05/17/2023   S/P reverse total shoulder arthroplasty, right 05/13/2023   Fever of unknown origin 06/10/2022   Stage 3b chronic kidney disease (HCC) 06/10/2022   ARF (acute renal failure) (HCC) 12/30/2021   DOE (dyspnea on exertion) 12/29/2021   S/P total knee arthroplasty, left 12/02/2021   Preoperative cardiovascular examination 11/18/2021   REM behavioral disorder 06/12/2021   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   CPAP (continuous positive airway pressure) dependence 01/20/2021   Unsteady gait when walking 01/20/2021   Complaint related to dreams 06/05/2019   Metabolic acidosis with increased anion gap and accumulation of organic acids 08/17/2018   Nausea, vomiting, and diarrhea 08/17/2018   AKI (acute kidney injury) (HCC) 08/17/2018   Overweight (BMI 25.0-29.9) 07/27/2018   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/27/2018   S/P right TKA 07/26/2018   S/P knee replacement 07/26/2018   UARS (upper airway resistance syndrome) 05/30/2018   Hyperlipidemia LDL goal <70 05/02/2018   Leg injury, right, initial encounter 01/06/2017   Contusion of right lower leg 01/06/2017   Hypoxemia 12/23/2015   Statin myopathy 05/20/2015   MCI (mild cognitive impairment)  05/20/2015   Edema 05/17/2015   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   Breast cancer genetic susceptibility 04/02/2015   OSA on CPAP 04/02/2015   Coronary artery disease involving native coronary artery with angina pectoris (HCC) 01/01/2014   Chest pain 01/01/2014   Essential hypertension    Ventricular tachycardia, non-sustained (HCC)    Breast cancer of upper-outer quadrant of right female breast (HCC) 06/28/2013   Postoperative visit 06/02/2013   Gout    Tachycardia     PCP: Alysia Penna, MD   REFERRING PROVIDER: Francena Hanly, MD  REFERRING DIAG: [phone] presence of Rt artificial joint  THERAPY DIAG:  Acute pain of right shoulder  Stiffness of right shoulder, not elsewhere classified  Muscle weakness (generalized)  Rationale for Evaluation and Treatment: Rehabilitation  ONSET DATE: Reverse total shoulder replacement 05/13/23  SUBJECTIVE:  SUBJECTIVE STATEMENT: The dry needling helped my neck.  Much less pain today.  Hand dominance: Right  PERTINENT HISTORY: Rt reverse total shoulder replacement 04/2023, Breast cancer 2014   PAIN: 06/15/23 Are you having pain? Yes: NPRS scale:3/10 neck and shoulder   Pain location: neck, Rt shoulder  Pain description: shoulder aching  Aggravating factors: trying to use Rt shoulder Relieving factors: pain medication, ice  PRECAUTIONS: Other: No arm bike due to lymph node removal in 2014  Follow protocol- scanned in media Current 06/15/23: current P/ROM limitations: ER to 45, flexion to 115, abduction 90  RED FLAGS: None   WEIGHT BEARING RESTRICTIONS: No  FALLS:  Has patient fallen in last 6 months? No  LIVING ENVIRONMENT: Lives with: lives with their spouse Lives in: House/apartment  OCCUPATION: Retired   PLOF: Independent and  Leisure: golf  PATIENT GOALS: Rt shoulder strength, ROM, return to functional use  NEXT MD VISIT: 6 months?   OBJECTIVE:  Note: Objective measures were completed at Evaluation unless otherwise noted.  DIAGNOSTIC FINDINGS:  None   PATIENT SURVEYS :  06/03/23: FOTO 40 (goal is 63)  COGNITION: Overall cognitive status: Within functional limits for tasks assessed     SENSATION: WFL  POSTURE: Forward head   UPPER EXTREMITY ROM:   Passive ROM Right eval Left eval  Shoulder flexion 85 with pain Full throughout   Shoulder extension    Shoulder abduction 60- pain, protocol limit   Shoulder adduction    Shoulder internal rotation    Shoulder external rotation 20 degrees-Pain   Elbow flexion full   Elbow extension Full    Wrist flexion    Wrist extension    Wrist ulnar deviation    Wrist radial deviation    Wrist pronation    Wrist supination    (Blank rows = not tested)  UPPER EXTREMITY MMT: Not tested due to post-op   JOINT MOBILITY TESTING:  NA  PALPATION:  Tension and palpable tenderness over Rt neck, upper trap and globally about the Rt shoulder.  Well healing surgical incision with reduced mobility.    TODAY'S TREATMENT:    DATE: 06/17/23  UE ranger: seated flexion, circles CW/CCW Upper cut 2 x 10, hitchhiker 2 x 10 Rt upper trap stretch 3x20 seconds-encouraged pt to do after DN today Manual:  P/ROM into ER, abduction and flexion to protocol limits Trigger Point Dry-Needling  Treatment instructions: Expect mild to moderate muscle soreness. S/S of pneumothorax if dry needled over a lung field, and to seek immediate medical attention should they occur. Patient verbalized understanding of these instructions and education.  Patient Consent Given: Yes Education handout provided: Yes Muscles treated: Rt upper trap (anterior approach with pt supine)  Treatment response/outcome: Utilized skilled palpation to identify trigger points.  During dry needling able to  palpate muscle twitch and muscle elongation  Elongation and release to Rt upper trap  Skilled palpation and monitoring by PT during dry needling  DATE: 06/15/23 Pendulums cw,ccw, fwd/back, side/side UE ranger: seated flexion, circles CW/CCW Upper cut 2 x 10, hitchhiker 2 x 10 Pulleys: flexion to 105, scaption to 90 x 20 each- Pt had some pain with this  Manual:  P/ROM into ER, abduction and flexion to protocol limits Trigger Point Dry-Needling  Treatment instructions: Expect mild to moderate muscle soreness. S/S of pneumothorax if dry needled over a lung field, and to seek immediate medical attention should they occur. Patient verbalized understanding of these instructions and education.  Patient Consent Given: Yes Education  handout provided: Yes Muscles treated: Rt upper trap and cervical multifidi  Treatment response/outcome: Utilized skilled palpation to identify trigger points.  During dry needling able to palpate muscle twitch and muscle elongation  Elongation and release to Rt upper trap  Skilled palpation and monitoring by PT during dry needling   DATE: 06/10/23 Pendulums cw,ccw, fwd/back, side/side Added isometric right shoulder abduction and ER x 5 holding 10 sec each Upper cut 2 x 10, hitchhiker 2 x 10 Pulleys: flexion to 90, scaption to 90 x 20 each (explained to patient that her protocol opens up to ROM to tolerance on the pulleys but to avoid being overly aggressive) Manual:  P/ROM into ER, abduction and flexion to protocol limits STM to right upper trap Ice to right shoulder post treatment x 10 min in hook lying with bolster under knees and yoga block under elbow  PATIENT EDUCATION: Education details: Access Code: [email or web address], protocol limits and protection phase of rehab Person educated: Patient Education method: Explanation, Demonstration, and Handouts Education comprehension: verbalized understanding and returned demonstration  HOME EXERCISE PROGRAM: Access Code:  [email or web address] URL: [email or web address] Date: 06/08/2023 Prepared by: Tresa Endo  Exercises - Seated Gripping Towel  - 3 x daily - 7 x weekly - 1 sets - 10 reps - 5shoulder ab hold - Isometric Shoulder Abduction at Wall  - 3 x daily - 7 x weekly - 1 sets - 10 reps - 5 hold - Seated Elbow Flexion and Extension AROM  - 3 x daily - 7 x weekly - 1-2 sets - 10 reps - Supine Shoulder Flexion PROM with Caregiver  - 1 x daily - 7 x weekly - 3 sets - 10 reps - Supine Shoulder Flexion AAROM with Hands Clasped  - 3 x daily - 7 x weekly - 1 sets - 10 reps - 5 hold - Seated Gentle Upper Trapezius Stretch  - 3 x daily - 7 x weekly - 1 sets - 3 reps - 20 hold - Seated Cervical Rotation AROM  - 3 x daily - 7 x weekly - 1 sets - 3 reps - 20 hold - Seated Shoulder Flexion AAROM with Pulley Behind  - 3 x daily - 7 x weekly - 1 sets - 10 reps - Seated Shoulder Scaption AAROM with Pulley at Side  - 1 x daily - 7 x weekly - 3 sets - 10 reps  ASSESSMENT:  CLINICAL IMPRESSION: Pt with reduced pain overall with good response to DN.  Pt has been following protocol limits regarding use of arm for functional tasks and has reduced pain in the shoulder since she has been resting.   Pt was able to perform all exercises and P/ROM with less pain. Pt had good response to DN to Rt UT and had improved tissue mobility with manual therapy after.  Improved tolerance to P/ROM today due to less pain.  Patient will benefit from skilled PT to address the below impairments and improve overall function.   OBJECTIVE IMPAIRMENTS: decreased activity tolerance, decreased ROM, decreased strength, increased muscle spasms, impaired flexibility, impaired UE functional use, postural dysfunction, and pain.   ACTIVITY LIMITATIONS: carrying, lifting, dressing, self feeding, and reach over head  PARTICIPATION LIMITATIONS: meal prep, cleaning, laundry, shopping, and community activity  PERSONAL FACTORS: 1 comorbidity: Rt total shoulder  replacement   are also affecting patient's functional outcome.   REHAB POTENTIAL: Good  CLINICAL DECISION MAKING: Stable/uncomplicated  EVALUATION COMPLEXITY: Low  GOALS: Goals reviewed with patient? Yes  SHORT TERM GOALS: Target date:  07/01/2023    Be independent in initial HEP Baseline: Goal status: MET  2.  Report > or = to 30% use of Rt UE with light home and self-care tasks due to improve ROM and strength  Baseline:  Goal status: INITIAL  3.  Demonstrate Rt shoulder P/ROM flexion to > or = to 110 degrees to improve reaching  Baseline: 85 Goal status: INITIAL  4.  Perform Rt shoulder ER to back of head to improve independence with self-care Baseline: 20 Goal status: INITIAL  LONG TERM GOALS: Target date: 07/29/2023    Be independent in advanced HEP Baseline:  Goal status: INITIAL  2.  Improve FOTO > or = to 59 to improve functional use of Rt UE Baseline: 40 Goal status: INITIAL  3. Improve Rt shoulder A/ROM flexion to > or = to 115 degrees to allow for reaching overhead into cabinets Baseline:  Goal status: INITIAL  4.  Report > or = to 70% of normal use of Rt UE for ADLs due to improved functional strength and A/ROM Baseline:  Goal status: INITIAL  5.  Demonstrate Rt shoulder A/ROM ER to T1 to improve self care Baseline:  Goal status: INITIAL  6.  Perform golf swing (chipping motion) without significant pain of substitution in Rt UE Baseline:  Goal status: INITIAL  PLAN: PT FREQUENCY: 2x/week  PT DURATION: 8 weeks  PLANNED INTERVENTIONS: 97110-Therapeutic exercises, 97530- Therapeutic activity, 97112- Neuromuscular re-education, 97535- Self Care, 87564- Manual therapy, [phone]- Canalith repositioning, [id number]- Aquatic Therapy, [id number]- Electrical stimulation (unattended), [phone]- Ionotophoresis 4mg /ml Dexamethasone, Patient/Family education, Taping, Dry Needling, Joint mobilization, Spinal mobilization, Vestibular training, Cryotherapy, and Moist  heat  PLAN FOR NEXT SESSION:see how pt responded to DN, gentle progression of Rt UE strength and ROM as indicated by post-op protocol  Lorrene Reid, PT 06/17/23 12:39 PM  [facility] [address] [place], [place] [address] Phone # [phone] Fax [phone]

## 2023-06-23 ENCOUNTER — Ambulatory Visit: Payer: Medicare Other

## 2023-06-23 ENCOUNTER — Ambulatory Visit: Payer: Medicare Other | Attending: Orthopedic Surgery

## 2023-06-23 DIAGNOSIS — M6281 Muscle weakness (generalized): Secondary | ICD-10-CM | POA: Diagnosis not present

## 2023-06-23 DIAGNOSIS — M25611 Stiffness of right shoulder, not elsewhere classified: Secondary | ICD-10-CM | POA: Diagnosis not present

## 2023-06-23 DIAGNOSIS — M25511 Pain in right shoulder: Secondary | ICD-10-CM | POA: Insufficient documentation

## 2023-06-23 DIAGNOSIS — R252 Cramp and spasm: Secondary | ICD-10-CM | POA: Diagnosis not present

## 2023-06-23 DIAGNOSIS — R293 Abnormal posture: Secondary | ICD-10-CM | POA: Insufficient documentation

## 2023-06-23 NOTE — Therapy (Signed)
OUTPATIENT PHYSICAL THERAPY TREATMENT   Patient Name: Tracey [name] MRN: [id number] DOB:April 16, 1946, 77 y.o., female Today's Date: 06/23/2023  END OF SESSION:  PT End of Session - 06/23/23 1323     Visit Number 6    Date for PT Re-Evaluation 07/29/23    Authorization Type Medicare-KX at 15    PT Start Time 1232    PT Stop Time 1317    PT Time Calculation (min) 45 min    Activity Tolerance Patient tolerated treatment well    Behavior During Therapy [facility] for tasks assessed/performed                  Past Medical History:  Diagnosis Date   Allergy    Anxiety    takes Ativan daily prn   Arthritis    Breast cancer (HCC) 2014   ER+/PR+/HEr2-,    Bruises easily    Colon polyps    2 polyps by report   Coronary artery disease involving native coronary artery of native heart without angina pectoris 01/01/2014   NSTEMI in January 2019 in [place]>> s/p DES x2 to the LAD (procedure complicated by stent thrombosis) Myoview 05/2020: EF 72, no infarct or ischemia; low risk Echocardiogram 05/2019: EF 60-65, GLS -21.5 Cardiac catheterization at [facility] in 08/2017: Normal left main, normal LCx, normal RCA; LAD/diagonal 95%>> PCI    Diverticulosis    GERD (gastroesophageal reflux disease)    occasionally takes Nexium    Gout    Headache    MIgraines- no headaches- has floater in eyes   History of bladder infections    many yrs ago   History of bronchitis    last time at least 10yrs ago   History of hiatal hernia    History of stress incontinence    Hyperlipidemia    takes Pravastatin daily   Hypertension    Insomnia    takes Ambien nightly prn   NSTEMI (non-ST elevated myocardial infarction) (HCC) 08/2017   OSA (obstructive sleep apnea)    on CPAP   Osteoarthritis    Peripheral edema    takes Furosemide daily prn   PONV (postoperative nausea and vomiting)    Postmenopausal hormone therapy    Radiation 07/27/13-09/07/13   Right Breast x 31  treatments   Rhinitis    uses Flonase prn   Sinus congestion    Status post breast reconstruction 10/15/2014   Bilateral implant removal and DIEP performed in [place], [place]   Tachycardia    takes Metoprolol daily   Ventricular tachycardia, non-sustained (HCC)    during sleep study 2009 with normal cardiac workup   Past Surgical History:  Procedure Laterality Date   ABDOMINAL HYSTERECTOMY     with BSO   APPENDECTOMY     ARTERY BIOPSY Left 05/15/2022   Procedure: BIOPSY TEMPORAL ARTERY;  Surgeon: Cephus Shelling, MD;  Location: [facility] OR;  Service: Vascular;  Laterality: Left;   AXILLARY SENTINEL NODE BIOPSY Right 05/23/2013   Procedure: AXILLARY SENTINEL NODE BIOPSY;  Surgeon: Adolph Pollack, MD;  Location: [facility] OR;  Service: General;  Laterality: Right;  nuc med injection 7:00   BREAST RECONSTRUCTION WITH PLACEMENT OF TISSUE EXPANDER AND FLEX HD (ACELLULAR HYDRATED DERMIS) Bilateral 05/23/2013   Procedure: BILATERAL BREAST RECONSTRUCTION WITH PLACEMENT OF TISSUE EXPANDER AND FLEX HD;  Surgeon: Etter Sjogren, MD;  Location: [facility] OR;  Service: Plastics;  Laterality: Bilateral;   CARDIAC CATHETERIZATION  1999   CATARACT EXTRACTION, BILATERAL Bilateral  CHOLECYSTECTOMY     COSMETIC SURGERY     OTHER SURGICAL HISTORY  09/2017   had cyst removal from spine    PERCUTANEOUS CORONARY STENT INTERVENTION (PCI-S)  08/2017   done in [place]     REMOVAL OF BILATERAL TISSUE EXPANDERS WITH PLACEMENT OF BILATERAL BREAST IMPLANTS Bilateral 05/17/2014   Procedure: REMOVAL OF BILATERAL TISSUE EXPANDERS WITH PLACEMENT OF BILATERAL BREAST IMPLANTS FOR RECONSTRUCTION;  Surgeon: Etter Sjogren, MD;  Location: [facility] OR;  Service: Plastics;  Laterality: Bilateral;   REVERSE SHOULDER ARTHROPLASTY Right 05/13/2023   Procedure: REVERSE SHOULDER ARTHROPLASTY;  Surgeon: Francena Hanly, MD;  Location: [facility] ORS;  Service: Orthopedics;  Laterality: Right;   TONSILLECTOMY     TOTAL KNEE ARTHROPLASTY Right 07/26/2018   Procedure:  TOTAL KNEE ARTHROPLASTY;  Surgeon: Durene Romans, MD;  Location: [facility] ORS;  Service: Orthopedics;  Laterality: Right;  70 mins   TOTAL KNEE ARTHROPLASTY Left 12/02/2021   Procedure: TOTAL KNEE ARTHROPLASTY;  Surgeon: Durene Romans, MD;  Location: [facility] ORS;  Service: Orthopedics;  Laterality: Left;   TOTAL MASTECTOMY Bilateral 05/23/2013   Procedure: TOTAL MASTECTOMY;  Surgeon: Adolph Pollack, MD;  Location: [facility] OR;  Service: General;  Laterality: Bilateral;   Patient Active Problem List   Diagnosis Date Noted   CAP (community acquired pneumonia) 05/18/2023   Hypokalemia 05/18/2023   Anemia 05/18/2023   Acute hypoxemic respiratory failure (HCC) 05/17/2023   S/P reverse total shoulder arthroplasty, right 05/13/2023   Fever of unknown origin 06/10/2022   Stage 3b chronic kidney disease (HCC) 06/10/2022   ARF (acute renal failure) (HCC) 12/30/2021   DOE (dyspnea on exertion) 12/29/2021   S/P total knee arthroplasty, left 12/02/2021   Preoperative cardiovascular examination 11/18/2021   REM behavioral disorder 06/12/2021   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   CPAP (continuous positive airway pressure) dependence 01/20/2021   Unsteady gait when walking 01/20/2021   Complaint related to dreams 06/05/2019   Metabolic acidosis with increased anion gap and accumulation of organic acids 08/17/2018   Nausea, vomiting, and diarrhea 08/17/2018   AKI (acute kidney injury) (HCC) 08/17/2018   Overweight (BMI 25.0-29.9) 07/27/2018   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/27/2018   S/P right TKA 07/26/2018   S/P knee replacement 07/26/2018   UARS (upper airway resistance syndrome) 05/30/2018   Hyperlipidemia LDL goal <70 05/02/2018   Leg injury, right, initial encounter 01/06/2017   Contusion of right lower leg 01/06/2017   Hypoxemia 12/23/2015   Statin myopathy 05/20/2015   MCI (mild cognitive impairment) 05/20/2015   Edema 05/17/2015   Complex sleep  apnea syndrome 04/02/2015   Breast cancer genetic susceptibility 04/02/2015   OSA on CPAP 04/02/2015   Coronary artery disease involving native coronary artery with angina pectoris (HCC) 01/01/2014   Chest pain 01/01/2014   Essential hypertension    Ventricular tachycardia, non-sustained (HCC)    Breast cancer of upper-outer quadrant of right female breast (HCC) 06/28/2013   Postoperative visit 06/02/2013   Gout    Tachycardia     PCP: Alysia Penna, MD   REFERRING PROVIDER: Francena Hanly, MD  REFERRING DIAG: [phone] presence of Rt artificial joint  THERAPY DIAG:  Acute pain of right shoulder  Stiffness of right shoulder, not elsewhere classified  Muscle weakness (generalized)  Rationale for Evaluation and Treatment: Rehabilitation  ONSET DATE: Reverse total shoulder replacement 05/13/23  SUBJECTIVE:  SUBJECTIVE STATEMENT: My neck is still the worst part of this.  I can do more and more each day.  I do still need to take my pain meds.   Hand dominance: Right  PERTINENT HISTORY: Rt reverse total shoulder replacement 04/2023, Breast cancer 2014   PAIN: 06/23/23 Are you having pain? Yes: NPRS scale:0-4/10 neck and shoulder   Pain location: neck, Rt shoulder  Pain description: shoulder aching  Aggravating factors: trying to use Rt shoulder Relieving factors: pain medication, ice  PRECAUTIONS: Other: No arm bike due to lymph node removal in 2014  Follow protocol- scanned in media Current 06/15/23: current P/ROM limitations: ER to 45, flexion to 115, abduction 90  RED FLAGS: None   WEIGHT BEARING RESTRICTIONS: No  FALLS:  Has patient fallen in last 6 months? No  LIVING ENVIRONMENT: Lives with: lives with their spouse Lives in: House/apartment  OCCUPATION: Retired   PLOF:  Independent and Leisure: golf  PATIENT GOALS: Rt shoulder strength, ROM, return to functional use  NEXT MD VISIT: 6 months?   OBJECTIVE:  Note: Objective measures were completed at Evaluation unless otherwise noted.  DIAGNOSTIC FINDINGS:  None   PATIENT SURVEYS :  06/03/23: FOTO 40 (goal is 66)  COGNITION: Overall cognitive status: Within functional limits for tasks assessed     SENSATION: WFL  POSTURE: Forward head   UPPER EXTREMITY ROM:   Passive ROM Right eval Right  06/23/23 Left eval  Shoulder flexion 85 with pain 130  Full throughout   Shoulder extension     Shoulder abduction 60- pain, protocol limit 85   Shoulder adduction     Shoulder internal rotation     Shoulder external rotation 20 degrees-Pain 32    Elbow flexion full    Elbow extension Full     Wrist flexion     Wrist extension     Wrist ulnar deviation     Wrist radial deviation     Wrist pronation     Wrist supination     (Blank rows = not tested)  UPPER EXTREMITY MMT: Not tested due to post-op   JOINT MOBILITY TESTING:  NA  PALPATION:  Tension and palpable tenderness over Rt neck, upper trap and globally about the Rt shoulder.  Well healing surgical incision with reduced mobility.    TODAY'S TREATMENT:    DATE: 06/23/23 UE ranger: seated flexion, circles CW/CCW Wall slides: flexion and scaption Rt upper trap stretch 3x20 seconds-encouraged pt to do after DN today Manual:  P/ROM into ER, abduction and flexion to protocol limits Trigger Point Dry-Needling  Treatment instructions: Expect mild to moderate muscle soreness. S/S of pneumothorax if dry needled over a lung field, and to seek immediate medical attention should they occur. Patient verbalized understanding of these instructions and education.  Patient Consent Given: Yes Education handout provided: Yes Muscles treated: Rt upper trap (anterior approach with pt supine)  Treatment response/outcome: Utilized skilled palpation to  identify trigger points.  During dry needling able to palpate muscle twitch and muscle elongation  Elongation and release to Rt upper trap  Skilled palpation and monitoring by PT during dry needling  DATE: 06/17/23  UE ranger: seated flexion, circles CW/CCW Upper cut 2 x 10, hitchhiker 2 x 10 Rt upper trap stretch 3x20 seconds-encouraged pt to do after DN today Manual:  P/ROM into ER, abduction and flexion to protocol limits Trigger Point Dry-Needling  Treatment instructions: Expect mild to moderate muscle soreness. S/S of pneumothorax if dry needled over a lung  field, and to seek immediate medical attention should they occur. Patient verbalized understanding of these instructions and education.  Patient Consent Given: Yes Education handout provided: Yes Muscles treated: Rt upper trap (anterior approach with pt supine)  Treatment response/outcome: Utilized skilled palpation to identify trigger points.  During dry needling able to palpate muscle twitch and muscle elongation  Elongation and release to Rt upper trap  Skilled palpation and monitoring by PT during dry needling  DATE: 06/15/23 Pendulums cw,ccw, fwd/back, side/side UE ranger: seated flexion, circles CW/CCW Upper cut 2 x 10, hitchhiker 2 x 10 Pulleys: flexion to 105, scaption to 90 x 20 each- Pt had some pain with this  Manual:  P/ROM into ER, abduction and flexion to protocol limits Trigger Point Dry-Needling  Treatment instructions: Expect mild to moderate muscle soreness. S/S of pneumothorax if dry needled over a lung field, and to seek immediate medical attention should they occur. Patient verbalized understanding of these instructions and education.  Patient Consent Given: Yes Education handout provided: Yes Muscles treated: Rt upper trap and cervical multifidi  Treatment response/outcome: Utilized skilled palpation to identify trigger points.  During dry needling able to palpate muscle twitch and muscle elongation   Elongation and release to Rt upper trap  Skilled palpation and monitoring by PT during dry needling     PATIENT EDUCATION: Education details: Access Code: [email or web address], protocol limits and protection phase of rehab Person educated: Patient Education method: Explanation, Demonstration, and Handouts Education comprehension: verbalized understanding and returned demonstration  HOME EXERCISE PROGRAM: Access Code: [email or web address] URL: [email or web address] Date: 06/08/2023 Prepared by: Tresa Endo  Exercises - Seated Gripping Towel  - 3 x daily - 7 x weekly - 1 sets - 10 reps - 5shoulder ab hold - Isometric Shoulder Abduction at Wall  - 3 x daily - 7 x weekly - 1 sets - 10 reps - 5 hold - Seated Elbow Flexion and Extension AROM  - 3 x daily - 7 x weekly - 1-2 sets - 10 reps - Supine Shoulder Flexion PROM with Caregiver  - 1 x daily - 7 x weekly - 3 sets - 10 reps - Supine Shoulder Flexion AAROM with Hands Clasped  - 3 x daily - 7 x weekly - 1 sets - 10 reps - 5 hold - Seated Gentle Upper Trapezius Stretch  - 3 x daily - 7 x weekly - 1 sets - 3 reps - 20 hold - Seated Cervical Rotation AROM  - 3 x daily - 7 x weekly - 1 sets - 3 reps - 20 hold - Seated Shoulder Flexion AAROM with Pulley Behind  - 3 x daily - 7 x weekly - 1 sets - 10 reps - Seated Shoulder Scaption AAROM with Pulley at Side  - 1 x daily - 7 x weekly - 3 sets - 10 reps  ASSESSMENT:  CLINICAL IMPRESSION: PT added to HEP for A/AROM wall slides.  Pt with overcompensation with her Rt upper trap and required tactile and verbal cues.  Pt had good response to DN to Rt UT and had improved tissue mobility with manual therapy after.  Improved tolerance to P/ROM today due to less pain.Improved P/ROM measures.   Patient will benefit from skilled PT to address the below impairments and improve overall function.   OBJECTIVE IMPAIRMENTS: decreased activity tolerance, decreased ROM, decreased strength, increased muscle spasms, impaired  flexibility, impaired UE functional use, postural dysfunction, and pain.   ACTIVITY LIMITATIONS: carrying, lifting, dressing, self feeding, and reach over head  PARTICIPATION LIMITATIONS: meal prep, cleaning, laundry, shopping, and community activity  PERSONAL FACTORS: 1 comorbidity: Rt total shoulder replacement   are also affecting patient's functional outcome.   REHAB POTENTIAL: Good  CLINICAL DECISION MAKING: Stable/uncomplicated  EVALUATION COMPLEXITY: Low  GOALS: Goals reviewed with patient? Yes  SHORT TERM GOALS: Target date: 07/01/2023    Be independent in initial HEP Baseline: Goal status: MET  2.  Report > or = to 30% use of Rt UE with light home and self-care tasks due to improve ROM and strength  Baseline:  Goal status: INITIAL  3.  Demonstrate Rt shoulder P/ROM flexion to > or = to 110 degrees to improve reaching  Baseline: 130 (06/23/23) Goal status: MET  4.  Perform Rt shoulder ER to back of head to improve independence with self-care Baseline: 32 (06/23/23) Goal status: In progress   LONG TERM GOALS: Target date: 07/29/2023    Be independent in advanced HEP Baseline:  Goal status: INITIAL  2.  Improve FOTO > or = to 59 to improve functional use of Rt UE Baseline: 40 Goal status: INITIAL  3. Improve Rt shoulder A/ROM flexion to > or = to 115 degrees to allow for reaching overhead into cabinets Baseline:  Goal status: INITIAL  4.  Report > or = to 70% of normal use of Rt UE for ADLs due to improved functional strength and A/ROM Baseline:  Goal status: INITIAL  5.  Demonstrate Rt shoulder A/ROM ER to T1 to improve self care Baseline:  Goal status: INITIAL  6.  Perform golf swing (chipping motion) without significant pain of substitution in Rt UE Baseline:  Goal status: INITIAL  PLAN: PT FREQUENCY: 2x/week  PT DURATION: 8 weeks  PLANNED INTERVENTIONS: 97110-Therapeutic exercises, 97530- Therapeutic activity, 97112- Neuromuscular  re-education, 97535- Self Care, 21308- Manual therapy, [phone]- Canalith repositioning, [id number]- Aquatic Therapy, 97014- Electrical stimulation (unattended), [phone]- Ionotophoresis 4mg /ml Dexamethasone, Patient/Family education, Taping, Dry Needling, Joint mobilization, Spinal mobilization, Vestibular training, Cryotherapy, and Moist heat  PLAN FOR NEXT SESSION:see how pt responded to DN, gentle progression of Rt UE strength and ROM as indicated by post-op protocol  Lorrene Reid, PT 06/23/23 1:23 PM  [facility] [address] [place], [place] [address] Phone # [phone] Fax [phone]

## 2023-06-24 ENCOUNTER — Ambulatory Visit: Payer: Medicare Other

## 2023-06-28 ENCOUNTER — Ambulatory Visit: Payer: Medicare Other

## 2023-06-28 DIAGNOSIS — M25511 Pain in right shoulder: Secondary | ICD-10-CM

## 2023-06-28 DIAGNOSIS — M6281 Muscle weakness (generalized): Secondary | ICD-10-CM | POA: Diagnosis not present

## 2023-06-28 DIAGNOSIS — R293 Abnormal posture: Secondary | ICD-10-CM | POA: Diagnosis not present

## 2023-06-28 DIAGNOSIS — M25611 Stiffness of right shoulder, not elsewhere classified: Secondary | ICD-10-CM

## 2023-06-28 DIAGNOSIS — R252 Cramp and spasm: Secondary | ICD-10-CM | POA: Diagnosis not present

## 2023-06-28 NOTE — Therapy (Signed)
OUTPATIENT PHYSICAL THERAPY TREATMENT   Patient Name: Tracey [name] MRN: [id number] DOB:01-04-1946, 77 y.o., female Today's Date: 06/28/2023  END OF SESSION:  PT End of Session - 06/28/23 1319     Visit Number 7    Date for PT Re-Evaluation 07/29/23    Authorization Type Medicare-KX at 15    Progress Note Due on Visit 10    PT Start Time 1231    PT Stop Time 1314    PT Time Calculation (min) 43 min    Activity Tolerance Patient tolerated treatment well    Behavior During Therapy [facility] for tasks assessed/performed                   Past Medical History:  Diagnosis Date   Allergy    Anxiety    takes Ativan daily prn   Arthritis    Breast cancer (HCC) 2014   ER+/PR+/HEr2-,    Bruises easily    Colon polyps    2 polyps by report   Coronary artery disease involving native coronary artery of native heart without angina pectoris 01/01/2014   NSTEMI in January 2019 in [place]>> s/p DES x2 to the LAD (procedure complicated by stent thrombosis) Myoview 05/2020: EF 72, no infarct or ischemia; low risk Echocardiogram 05/2019: EF 60-65, GLS -21.5 Cardiac catheterization at [facility] in 08/2017: Normal left main, normal LCx, normal RCA; LAD/diagonal 95%>> PCI    Diverticulosis    GERD (gastroesophageal reflux disease)    occasionally takes Nexium    Gout    Headache    MIgraines- no headaches- has floater in eyes   History of bladder infections    many yrs ago   History of bronchitis    last time at least 67yrs ago   History of hiatal hernia    History of stress incontinence    Hyperlipidemia    takes Pravastatin daily   Hypertension    Insomnia    takes Ambien nightly prn   NSTEMI (non-ST elevated myocardial infarction) (HCC) 08/2017   OSA (obstructive sleep apnea)    on CPAP   Osteoarthritis    Peripheral edema    takes Furosemide daily prn   PONV (postoperative nausea and vomiting)    Postmenopausal hormone therapy    Radiation  07/27/13-09/07/13   Right Breast x 31 treatments   Rhinitis    uses Flonase prn   Sinus congestion    Status post breast reconstruction 10/15/2014   Bilateral implant removal and DIEP performed in [place], [place]   Tachycardia    takes Metoprolol daily   Ventricular tachycardia, non-sustained (HCC)    during sleep study 2009 with normal cardiac workup   Past Surgical History:  Procedure Laterality Date   ABDOMINAL HYSTERECTOMY     with BSO   APPENDECTOMY     ARTERY BIOPSY Left 05/15/2022   Procedure: BIOPSY TEMPORAL ARTERY;  Surgeon: Cephus Shelling, MD;  Location: [facility] OR;  Service: Vascular;  Laterality: Left;   AXILLARY SENTINEL NODE BIOPSY Right 05/23/2013   Procedure: AXILLARY SENTINEL NODE BIOPSY;  Surgeon: Adolph Pollack, MD;  Location: [facility] OR;  Service: General;  Laterality: Right;  nuc med injection 7:00   BREAST RECONSTRUCTION WITH PLACEMENT OF TISSUE EXPANDER AND FLEX HD (ACELLULAR HYDRATED DERMIS) Bilateral 05/23/2013   Procedure: BILATERAL BREAST RECONSTRUCTION WITH PLACEMENT OF TISSUE EXPANDER AND FLEX HD;  Surgeon: Etter Sjogren, MD;  Location: [facility] OR;  Service: Plastics;  Laterality: Bilateral;   CARDIAC CATHETERIZATION  1999   CATARACT EXTRACTION, BILATERAL Bilateral    CHOLECYSTECTOMY     COSMETIC SURGERY     OTHER SURGICAL HISTORY  09/2017   had cyst removal from spine    PERCUTANEOUS CORONARY STENT INTERVENTION (PCI-S)  08/2017   done in [place]     REMOVAL OF BILATERAL TISSUE EXPANDERS WITH PLACEMENT OF BILATERAL BREAST IMPLANTS Bilateral 05/17/2014   Procedure: REMOVAL OF BILATERAL TISSUE EXPANDERS WITH PLACEMENT OF BILATERAL BREAST IMPLANTS FOR RECONSTRUCTION;  Surgeon: Etter Sjogren, MD;  Location: [facility] OR;  Service: Plastics;  Laterality: Bilateral;   REVERSE SHOULDER ARTHROPLASTY Right 05/13/2023   Procedure: REVERSE SHOULDER ARTHROPLASTY;  Surgeon: Francena Hanly, MD;  Location: [facility] ORS;  Service: Orthopedics;  Laterality: Right;   TONSILLECTOMY     TOTAL KNEE  ARTHROPLASTY Right 07/26/2018   Procedure: TOTAL KNEE ARTHROPLASTY;  Surgeon: Durene Romans, MD;  Location: [facility] ORS;  Service: Orthopedics;  Laterality: Right;  70 mins   TOTAL KNEE ARTHROPLASTY Left 12/02/2021   Procedure: TOTAL KNEE ARTHROPLASTY;  Surgeon: Durene Romans, MD;  Location: [facility] ORS;  Service: Orthopedics;  Laterality: Left;   TOTAL MASTECTOMY Bilateral 05/23/2013   Procedure: TOTAL MASTECTOMY;  Surgeon: Adolph Pollack, MD;  Location: [facility] OR;  Service: General;  Laterality: Bilateral;   Patient Active Problem List   Diagnosis Date Noted   CAP (community acquired pneumonia) 05/18/2023   Hypokalemia 05/18/2023   Anemia 05/18/2023   Acute hypoxemic respiratory failure (HCC) 05/17/2023   S/P reverse total shoulder arthroplasty, right 05/13/2023   Fever of unknown origin 06/10/2022   Stage 3b chronic kidney disease (HCC) 06/10/2022   ARF (acute renal failure) (HCC) 12/30/2021   DOE (dyspnea on exertion) 12/29/2021   S/P total knee arthroplasty, left 12/02/2021   Preoperative cardiovascular examination 11/18/2021   REM behavioral disorder 06/12/2021   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   CPAP (continuous positive airway pressure) dependence 01/20/2021   Unsteady gait when walking 01/20/2021   Complaint related to dreams 06/05/2019   Metabolic acidosis with increased anion gap and accumulation of organic acids 08/17/2018   Nausea, vomiting, and diarrhea 08/17/2018   AKI (acute kidney injury) (HCC) 08/17/2018   Overweight (BMI 25.0-29.9) 07/27/2018   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/27/2018   S/P right TKA 07/26/2018   S/P knee replacement 07/26/2018   UARS (upper airway resistance syndrome) 05/30/2018   Hyperlipidemia LDL goal <70 05/02/2018   Leg injury, right, initial encounter 01/06/2017   Contusion of right lower leg 01/06/2017   Hypoxemia 12/23/2015   Statin myopathy 05/20/2015   MCI (mild cognitive impairment)  05/20/2015   Edema 05/17/2015   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   Breast cancer genetic susceptibility 04/02/2015   OSA on CPAP 04/02/2015   Coronary artery disease involving native coronary artery with angina pectoris (HCC) 01/01/2014   Chest pain 01/01/2014   Essential hypertension    Ventricular tachycardia, non-sustained (HCC)    Breast cancer of upper-outer quadrant of right female breast (HCC) 06/28/2013   Postoperative visit 06/02/2013   Gout    Tachycardia     PCP: Alysia Penna, MD   REFERRING PROVIDER: Francena Hanly, MD  REFERRING DIAG: [phone] presence of Rt artificial joint  THERAPY DIAG:  Acute pain of right shoulder  Stiffness of right shoulder, not elsewhere classified  Muscle weakness (generalized)  Rationale for Evaluation and Treatment: Rehabilitation  ONSET DATE: Reverse total shoulder replacement 05/13/23  SUBJECTIVE:  SUBJECTIVE STATEMENT: My neck is still really bothering me.  I like any exercise where I get a good stretch.    Hand dominance: Right  PERTINENT HISTORY: Rt reverse total shoulder replacement 04/2023, Breast cancer 2014   PAIN: 06/23/23 Are you having pain? Yes: NPRS scale:0-3/10 neck and shoulder   Pain location: neck, Rt shoulder  Pain description: shoulder aching  Aggravating factors: trying to use Rt shoulder Relieving factors: pain medication, ice  PRECAUTIONS: Other: No arm bike due to lymph node removal in 2014  Follow protocol- scanned in media Current 06/15/23: current P/ROM limitations: ER to 45, flexion to 115, abduction 90  RED FLAGS: None   WEIGHT BEARING RESTRICTIONS: No  FALLS:  Has patient fallen in last 6 months? No  LIVING ENVIRONMENT: Lives with: lives with their spouse Lives in:  House/apartment  OCCUPATION: Retired   PLOF: Independent and Leisure: golf  PATIENT GOALS: Rt shoulder strength, ROM, return to functional use  NEXT MD VISIT: 6 months?   OBJECTIVE:  Note: Objective measures were completed at Evaluation unless otherwise noted.  DIAGNOSTIC FINDINGS:  None   PATIENT SURVEYS :  06/03/23: FOTO 40 (goal is 18)  COGNITION: Overall cognitive status: Within functional limits for tasks assessed     SENSATION: WFL  POSTURE: Forward head   UPPER EXTREMITY ROM:   Passive ROM Right eval Right  06/23/23 Left eval  Shoulder flexion 85 with pain 130  Full throughout   Shoulder extension     Shoulder abduction 60- pain, protocol limit 85   Shoulder adduction     Shoulder internal rotation     Shoulder external rotation 20 degrees-Pain 32    Elbow flexion full    Elbow extension Full     Wrist flexion     Wrist extension     Wrist ulnar deviation     Wrist radial deviation     Wrist pronation     Wrist supination     (Blank rows = not tested)  UPPER EXTREMITY MMT: Not tested due to post-op   JOINT MOBILITY TESTING:  NA  PALPATION:  Tension and palpable tenderness over Rt neck, upper trap and globally about the Rt shoulder.  Well healing surgical incision with reduced mobility.    TODAY'S TREATMENT:     DATE: 06/28/23 UE on steps: flexion x10 Finger ladder: flexion and scaption x 5  Rt upper trap stretch 3x20 seconds Sidelying ER, flexion and abduction of Rt shoulder x 5 each ER stretch with cane in supine x10 Manual:  P/ROM into ER, abduction and flexion to protocol limits  DATE: 06/23/23 UE ranger: seated flexion, circles CW/CCW Wall slides: flexion and scaption Rt upper trap stretch 3x20 seconds-encouraged pt to do after DN today Manual:  P/ROM into ER, abduction and flexion to protocol limits Trigger Point Dry-Needling  Treatment instructions: Expect mild to moderate muscle soreness. S/S of pneumothorax if dry needled  over a lung field, and to seek immediate medical attention should they occur. Patient verbalized understanding of these instructions and education.  Patient Consent Given: Yes Education handout provided: Yes Muscles treated: Rt upper trap (anterior approach with pt supine)  Treatment response/outcome: Utilized skilled palpation to identify trigger points.  During dry needling able to palpate muscle twitch and muscle elongation  Elongation and release to Rt upper trap  Skilled palpation and monitoring by PT during dry needling  DATE: 06/17/23  UE ranger: seated flexion, circles CW/CCW Upper cut 2 x 10, hitchhiker 2 x 10 Rt  upper trap stretch 3x20 seconds-encouraged pt to do after DN today Manual:  P/ROM into ER, abduction and flexion to protocol limits Trigger Point Dry-Needling  Treatment instructions: Expect mild to moderate muscle soreness. S/S of pneumothorax if dry needled over a lung field, and to seek immediate medical attention should they occur. Patient verbalized understanding of these instructions and education.  Patient Consent Given: Yes Education handout provided: Yes Muscles treated: Rt upper trap (anterior approach with pt supine)  Treatment response/outcome: Utilized skilled palpation to identify trigger points.  During dry needling able to palpate muscle twitch and muscle elongation  Elongation and release to Rt upper trap  Skilled palpation and monitoring by PT during dry needling   PATIENT EDUCATION: Education details: Access Code: [email or web address], protocol limits and protection phase of rehab Person educated: Patient Education method: Explanation, Demonstration, and Handouts Education comprehension: verbalized understanding and returned demonstration  HOME EXERCISE PROGRAM: Access Code: [email or web address] URL: [email or web address] Date: 06/28/2023 Prepared by: Tresa Endo  Exercises - Seated Gripping Towel  - 3 x daily - 7 x weekly - 1 sets - 10 reps - 5shoulder ab  hold - Isometric Shoulder Abduction at Wall  - 3 x daily - 7 x weekly - 1 sets - 10 reps - 5 hold - Seated Elbow Flexion and Extension AROM  - 3 x daily - 7 x weekly - 1-2 sets - 10 reps - Supine Shoulder Flexion PROM with Caregiver  - 1 x daily - 7 x weekly - 3 sets - 10 reps - Supine Shoulder Flexion AAROM with Hands Clasped  - 3 x daily - 7 x weekly - 1 sets - 10 reps - 5 hold - Seated Gentle Upper Trapezius Stretch  - 3 x daily - 7 x weekly - 1 sets - 3 reps - 20 hold - Seated Cervical Rotation AROM  - 3 x daily - 7 x weekly - 1 sets - 3 reps - 20 hold - Seated Shoulder Flexion AAROM with Pulley Behind  - 3 x daily - 7 x weekly - 1 sets - 10 reps - Seated Shoulder Scaption AAROM with Pulley at Side  - 1 x daily - 7 x weekly - 3 sets - 10 reps - Shoulder Flexion Wall Slide with Towel  - 1-2 x daily - 7 x weekly - 1 sets - 5 reps - 5-10 hold - Scaption Wall Slide with Towel  - 1-2 x daily - 7 x weekly - 1 sets - 5 reps - 5-10 hold - Supine Shoulder External Rotation with Dowel  - 1-2 x daily - 7 x weekly - 1 sets - 5-10 reps - 5 hold - Sidelying Shoulder External Rotation  - 1-2 x daily - 7 x weekly - 1-2 sets - 5-10 reps - Sidelying Shoulder Flexion 15 Degrees  - 1 x daily - 7 x weekly - 1-2 sets - 5-10 reps - Sidelying Shoulder Abduction  - 1 x daily - 7 x weekly - 1-2 sets - 5-10 reps  ASSESSMENT:  CLINICAL IMPRESSION: Pt is making more corrections with scapular depression with shoulder motion against gravity.  She continues to have neck pain on the Rt side.  PT added to HEP for gentle strength in sidelying.   Improved tolerance to P/ROM today due to less pain and improved P/ROM measures.   Patient will benefit from skilled PT to address the below impairments and improve overall function.   OBJECTIVE IMPAIRMENTS: decreased activity tolerance, decreased ROM, decreased strength, increased muscle spasms, impaired  flexibility, impaired UE functional use, postural dysfunction, and pain.    ACTIVITY LIMITATIONS: carrying, lifting, dressing, self feeding, and reach over head  PARTICIPATION LIMITATIONS: meal prep, cleaning, laundry, shopping, and community activity  PERSONAL FACTORS: 1 comorbidity: Rt total shoulder replacement   are also affecting patient's functional outcome.   REHAB POTENTIAL: Good  CLINICAL DECISION MAKING: Stable/uncomplicated  EVALUATION COMPLEXITY: Low  GOALS: Goals reviewed with patient? Yes  SHORT TERM GOALS: Target date: 07/01/2023    Be independent in initial HEP Baseline: Goal status: MET  2.  Report > or = to 30% use of Rt UE with light home and self-care tasks due to improve ROM and strength  Baseline:  Goal status: INITIAL  3.  Demonstrate Rt shoulder P/ROM flexion to > or = to 110 degrees to improve reaching  Baseline: 130 (06/23/23) Goal status: MET  4.  Perform Rt shoulder ER to back of head to improve independence with self-care Baseline: 32 (06/23/23) Goal status: In progress   LONG TERM GOALS: Target date: 07/29/2023    Be independent in advanced HEP Baseline:  Goal status: INITIAL  2.  Improve FOTO > or = to 59 to improve functional use of Rt UE Baseline: 40 Goal status: INITIAL  3. Improve Rt shoulder A/ROM flexion to > or = to 115 degrees to allow for reaching overhead into cabinets Baseline:  Goal status: INITIAL  4.  Report > or = to 70% of normal use of Rt UE for ADLs due to improved functional strength and A/ROM Baseline:  Goal status: INITIAL  5.  Demonstrate Rt shoulder A/ROM ER to T1 to improve self care Baseline:  Goal status: INITIAL  6.  Perform golf swing (chipping motion) without significant pain of substitution in Rt UE Baseline:  Goal status: INITIAL  PLAN: PT FREQUENCY: 2x/week  PT DURATION: 8 weeks  PLANNED INTERVENTIONS: 97110-Therapeutic exercises, 97530- Therapeutic activity, 97112- Neuromuscular re-education, 97535- Self Care, 28413- Manual therapy, [phone]- Canalith  repositioning, [id number]- Aquatic Therapy, 97014- Electrical stimulation (unattended), [phone]- Ionotophoresis 4mg /ml Dexamethasone, Patient/Family education, Taping, Dry Needling, Joint mobilization, Spinal mobilization, Vestibular training, Cryotherapy, and Moist heat  PLAN FOR NEXT SESSION:see how pt responded to DN, gentle progression of Rt UE strength and ROM as indicated by post-op protocol  Lorrene Reid, PT 06/28/23 1:23 PM  [facility] [address] [place], [place] [address] Phone # [phone] Fax [phone]

## 2023-06-30 ENCOUNTER — Ambulatory Visit: Payer: Medicare Other

## 2023-06-30 DIAGNOSIS — R293 Abnormal posture: Secondary | ICD-10-CM | POA: Diagnosis not present

## 2023-06-30 DIAGNOSIS — R252 Cramp and spasm: Secondary | ICD-10-CM | POA: Diagnosis not present

## 2023-06-30 DIAGNOSIS — M25511 Pain in right shoulder: Secondary | ICD-10-CM

## 2023-06-30 DIAGNOSIS — M6281 Muscle weakness (generalized): Secondary | ICD-10-CM | POA: Diagnosis not present

## 2023-06-30 DIAGNOSIS — M25611 Stiffness of right shoulder, not elsewhere classified: Secondary | ICD-10-CM

## 2023-06-30 NOTE — Therapy (Signed)
OUTPATIENT PHYSICAL THERAPY TREATMENT   Patient Name: Tracey [name] MRN: [id number] DOB:31-Jul-1946, 77 y.o., female Today's Date: 06/30/2023  END OF SESSION:  PT End of Session - 06/30/23 1608     Visit Number 8    Date for PT Re-Evaluation 07/29/23    Authorization Type Medicare-KX at 15    Progress Note Due on Visit 10    PT Start Time 1525    PT Stop Time 1606    PT Time Calculation (min) 41 min    Activity Tolerance Patient tolerated treatment well    Behavior During Therapy [facility] for tasks assessed/performed                    Past Medical History:  Diagnosis Date   Allergy    Anxiety    takes Ativan daily prn   Arthritis    Breast cancer (HCC) 2014   ER+/PR+/HEr2-,    Bruises easily    Colon polyps    2 polyps by report   Coronary artery disease involving native coronary artery of native heart without angina pectoris 01/01/2014   NSTEMI in January 2019 in [place]>> s/p DES x2 to the LAD (procedure complicated by stent thrombosis) Myoview 05/2020: EF 72, no infarct or ischemia; low risk Echocardiogram 05/2019: EF 60-65, GLS -21.5 Cardiac catheterization at [facility] in 08/2017: Normal left main, normal LCx, normal RCA; LAD/diagonal 95%>> PCI    Diverticulosis    GERD (gastroesophageal reflux disease)    occasionally takes Nexium    Gout    Headache    MIgraines- no headaches- has floater in eyes   History of bladder infections    many yrs ago   History of bronchitis    last time at least 30yrs ago   History of hiatal hernia    History of stress incontinence    Hyperlipidemia    takes Pravastatin daily   Hypertension    Insomnia    takes Ambien nightly prn   NSTEMI (non-ST elevated myocardial infarction) (HCC) 08/2017   OSA (obstructive sleep apnea)    on CPAP   Osteoarthritis    Peripheral edema    takes Furosemide daily prn   PONV (postoperative nausea and vomiting)    Postmenopausal hormone therapy    Radiation  07/27/13-09/07/13   Right Breast x 31 treatments   Rhinitis    uses Flonase prn   Sinus congestion    Status post breast reconstruction 10/15/2014   Bilateral implant removal and DIEP performed in [place], [place]   Tachycardia    takes Metoprolol daily   Ventricular tachycardia, non-sustained (HCC)    during sleep study 2009 with normal cardiac workup   Past Surgical History:  Procedure Laterality Date   ABDOMINAL HYSTERECTOMY     with BSO   APPENDECTOMY     ARTERY BIOPSY Left 05/15/2022   Procedure: BIOPSY TEMPORAL ARTERY;  Surgeon: Cephus Shelling, MD;  Location: [facility] OR;  Service: Vascular;  Laterality: Left;   AXILLARY SENTINEL NODE BIOPSY Right 05/23/2013   Procedure: AXILLARY SENTINEL NODE BIOPSY;  Surgeon: Adolph Pollack, MD;  Location: [facility] OR;  Service: General;  Laterality: Right;  nuc med injection 7:00   BREAST RECONSTRUCTION WITH PLACEMENT OF TISSUE EXPANDER AND FLEX HD (ACELLULAR HYDRATED DERMIS) Bilateral 05/23/2013   Procedure: BILATERAL BREAST RECONSTRUCTION WITH PLACEMENT OF TISSUE EXPANDER AND FLEX HD;  Surgeon: Etter Sjogren, MD;  Location: [facility] OR;  Service: Plastics;  Laterality: Bilateral;   CARDIAC  CATHETERIZATION  1999   CATARACT EXTRACTION, BILATERAL Bilateral    CHOLECYSTECTOMY     COSMETIC SURGERY     OTHER SURGICAL HISTORY  09/2017   had cyst removal from spine    PERCUTANEOUS CORONARY STENT INTERVENTION (PCI-S)  08/2017   done in [place]     REMOVAL OF BILATERAL TISSUE EXPANDERS WITH PLACEMENT OF BILATERAL BREAST IMPLANTS Bilateral 05/17/2014   Procedure: REMOVAL OF BILATERAL TISSUE EXPANDERS WITH PLACEMENT OF BILATERAL BREAST IMPLANTS FOR RECONSTRUCTION;  Surgeon: Etter Sjogren, MD;  Location: [facility] OR;  Service: Plastics;  Laterality: Bilateral;   REVERSE SHOULDER ARTHROPLASTY Right 05/13/2023   Procedure: REVERSE SHOULDER ARTHROPLASTY;  Surgeon: Francena Hanly, MD;  Location: [facility] ORS;  Service: Orthopedics;  Laterality: Right;   TONSILLECTOMY     TOTAL KNEE  ARTHROPLASTY Right 07/26/2018   Procedure: TOTAL KNEE ARTHROPLASTY;  Surgeon: Durene Romans, MD;  Location: [facility] ORS;  Service: Orthopedics;  Laterality: Right;  70 mins   TOTAL KNEE ARTHROPLASTY Left 12/02/2021   Procedure: TOTAL KNEE ARTHROPLASTY;  Surgeon: Durene Romans, MD;  Location: [facility] ORS;  Service: Orthopedics;  Laterality: Left;   TOTAL MASTECTOMY Bilateral 05/23/2013   Procedure: TOTAL MASTECTOMY;  Surgeon: Adolph Pollack, MD;  Location: [facility] OR;  Service: General;  Laterality: Bilateral;   Patient Active Problem List   Diagnosis Date Noted   CAP (community acquired pneumonia) 05/18/2023   Hypokalemia 05/18/2023   Anemia 05/18/2023   Acute hypoxemic respiratory failure (HCC) 05/17/2023   S/P reverse total shoulder arthroplasty, right 05/13/2023   Fever of unknown origin 06/10/2022   Stage 3b chronic kidney disease (HCC) 06/10/2022   ARF (acute renal failure) (HCC) 12/30/2021   DOE (dyspnea on exertion) 12/29/2021   S/P total knee arthroplasty, left 12/02/2021   Preoperative cardiovascular examination 11/18/2021   REM behavioral disorder 06/12/2021   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   CPAP (continuous positive airway pressure) dependence 01/20/2021   Unsteady gait when walking 01/20/2021   Complaint related to dreams 06/05/2019   Metabolic acidosis with increased anion gap and accumulation of organic acids 08/17/2018   Nausea, vomiting, and diarrhea 08/17/2018   AKI (acute kidney injury) (HCC) 08/17/2018   Overweight (BMI 25.0-29.9) 07/27/2018   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/27/2018   S/P right TKA 07/26/2018   S/P knee replacement 07/26/2018   UARS (upper airway resistance syndrome) 05/30/2018   Hyperlipidemia LDL goal <70 05/02/2018   Leg injury, right, initial encounter 01/06/2017   Contusion of right lower leg 01/06/2017   Hypoxemia 12/23/2015   Statin myopathy 05/20/2015   MCI (mild cognitive impairment)  05/20/2015   Edema 05/17/2015   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   Breast cancer genetic susceptibility 04/02/2015   OSA on CPAP 04/02/2015   Coronary artery disease involving native coronary artery with angina pectoris (HCC) 01/01/2014   Chest pain 01/01/2014   Essential hypertension    Ventricular tachycardia, non-sustained (HCC)    Breast cancer of upper-outer quadrant of right female breast (HCC) 06/28/2013   Postoperative visit 06/02/2013   Gout    Tachycardia     PCP: Alysia Penna, MD   REFERRING PROVIDER: Francena Hanly, MD  REFERRING DIAG: [phone] presence of Rt artificial joint  THERAPY DIAG:  Acute pain of right shoulder  Stiffness of right shoulder, not elsewhere classified  Muscle weakness (generalized)  Rationale for Evaluation and Treatment: Rehabilitation  ONSET DATE: Reverse total shoulder replacement 05/13/23  SUBJECTIVE:  SUBJECTIVE STATEMENT: I saw the PA today and she said I am doing better than expected.  I won't be able to see them again before I leave for [facility] in December and she wants you to keep pushing me.  Hand dominance: Right  PERTINENT HISTORY: Rt reverse total shoulder replacement 04/2023, Breast cancer 2014   PAIN: 06/30/23 Are you having pain? Yes: NPRS scale:0-3/10 neck and shoulder   Pain location: neck, Rt shoulder  Pain description: shoulder aching  Aggravating factors: trying to use Rt shoulder Relieving factors: pain medication, ice  PRECAUTIONS: Other: No arm bike due to lymph node removal in 2014  Follow protocol- scanned in media Current 06/15/23: current P/ROM limitations: ER to 45, flexion to 115, abduction 90  RED FLAGS: None   WEIGHT BEARING RESTRICTIONS: No  FALLS:  Has patient fallen in last 6 months? No  LIVING  ENVIRONMENT: Lives with: lives with their spouse Lives in: House/apartment  OCCUPATION: Retired   PLOF: Independent and Leisure: golf  PATIENT GOALS: Rt shoulder strength, ROM, return to functional use  NEXT MD VISIT: 6 months?   OBJECTIVE:  Note: Objective measures were completed at Evaluation unless otherwise noted.  DIAGNOSTIC FINDINGS:  None   PATIENT SURVEYS :  06/03/23: FOTO 40 (goal is 48)  COGNITION: Overall cognitive status: Within functional limits for tasks assessed     SENSATION: WFL  POSTURE: Forward head   UPPER EXTREMITY ROM:   Passive ROM Right eval Right  06/23/23 Left eval  Shoulder flexion 85 with pain 130  Full throughout   Shoulder extension     Shoulder abduction 60- pain, protocol limit 85   Shoulder adduction     Shoulder internal rotation     Shoulder external rotation 20 degrees-Pain 32    Elbow flexion full    Elbow extension Full     Wrist flexion     Wrist extension     Wrist ulnar deviation     Wrist radial deviation     Wrist pronation     Wrist supination     (Blank rows = not tested)  UPPER EXTREMITY MMT: Not tested due to post-op   JOINT MOBILITY TESTING:  NA  PALPATION:  Tension and palpable tenderness over Rt neck, upper trap and globally about the Rt shoulder.  Well healing surgical incision with reduced mobility.    TODAY'S TREATMENT:    DATE: 07/05/23 Finger ladder: flexion x10- scapular depression Simulation of taking a drink with 1# weight- tactile cues to use anterior shoulder Supine: narrow chest press 2x10 Supine: shoulder flexion 2x10 Rt upper trap stretch 3x20 seconds Sidelying ER, flexion and abduction of Rt shoulder x 10 each Manual:  P/ROM into ER, abduction to tolerance   DATE: 06/28/23 UE on steps: flexion x10 Finger ladder: flexion and scaption x 5  Rt upper trap stretch 3x20 seconds Sidelying ER, flexion and abduction of Rt shoulder x 5 each ER stretch with cane in supine x10 Manual:   P/ROM into ER, abduction and flexion to protocol limits  DATE: 06/23/23 UE ranger: seated flexion, circles CW/CCW Wall slides: flexion and scaption Rt upper trap stretch 3x20 seconds-encouraged pt to do after DN today Manual:  P/ROM into ER, abduction and flexion to protocol limits Trigger Point Dry-Needling  Treatment instructions: Expect mild to moderate muscle soreness. S/S of pneumothorax if dry needled over a lung field, and to seek immediate medical attention should they occur. Patient verbalized understanding of these instructions and education.  Patient Consent Given:  Yes Education handout provided: Yes Muscles treated: Rt upper trap (anterior approach with pt supine)  Treatment response/outcome: Utilized skilled palpation to identify trigger points.  During dry needling able to palpate muscle twitch and muscle elongation  Elongation and release to Rt upper trap  Skilled palpation and monitoring by PT during dry needling    PATIENT EDUCATION: Education details: Access Code: [email or web address], protocol limits and protection phase of rehab Person educated: Patient Education method: Explanation, Demonstration, and Handouts Education comprehension: verbalized understanding and returned demonstration  HOME EXERCISE PROGRAM: Access Code: [email or web address] URL: [email or web address] Date: 06/28/2023 Prepared by: Tresa Endo  Exercises - Seated Gripping Towel  - 3 x daily - 7 x weekly - 1 sets - 10 reps - 5shoulder ab hold - Isometric Shoulder Abduction at Wall  - 3 x daily - 7 x weekly - 1 sets - 10 reps - 5 hold - Seated Elbow Flexion and Extension AROM  - 3 x daily - 7 x weekly - 1-2 sets - 10 reps - Supine Shoulder Flexion PROM with Caregiver  - 1 x daily - 7 x weekly - 3 sets - 10 reps - Supine Shoulder Flexion AAROM with Hands Clasped  - 3 x daily - 7 x weekly - 1 sets - 10 reps - 5 hold - Seated Gentle Upper Trapezius Stretch  - 3 x daily - 7 x weekly - 1 sets - 3 reps - 20 hold -  Seated Cervical Rotation AROM  - 3 x daily - 7 x weekly - 1 sets - 3 reps - 20 hold - Seated Shoulder Flexion AAROM with Pulley Behind  - 3 x daily - 7 x weekly - 1 sets - 10 reps - Seated Shoulder Scaption AAROM with Pulley at Side  - 1 x daily - 7 x weekly - 3 sets - 10 reps - Shoulder Flexion Wall Slide with Towel  - 1-2 x daily - 7 x weekly - 1 sets - 5 reps - 5-10 hold - Scaption Wall Slide with Towel  - 1-2 x daily - 7 x weekly - 1 sets - 5 reps - 5-10 hold - Supine Shoulder External Rotation with Dowel  - 1-2 x daily - 7 x weekly - 1 sets - 5-10 reps - 5 hold - Sidelying Shoulder External Rotation  - 1-2 x daily - 7 x weekly - 1-2 sets - 5-10 reps - Sidelying Shoulder Flexion 15 Degrees  - 1 x daily - 7 x weekly - 1-2 sets - 5-10 reps - Sidelying Shoulder Abduction  - 1 x daily - 7 x weekly - 1-2 sets - 5-10 reps  ASSESSMENT:  CLINICAL IMPRESSION: Pt is making improved corrections with scapular depression with shoulder motion against gravity.  Pt saw her MD today and is doing very well post-op.  Pt was able to do more reps of strength exercises without fatigue.  Pt is challenged with drinking with a cup with Rt UE and PT educated pt how to simulate.  Improved tolerance to P/ROM today due to less pain and improved P/ROM measures.   Patient will benefit from skilled PT to address the below impairments and improve overall function.   OBJECTIVE IMPAIRMENTS: decreased activity tolerance, decreased ROM, decreased strength, increased muscle spasms, impaired flexibility, impaired UE functional use, postural dysfunction, and pain.   ACTIVITY LIMITATIONS: carrying, lifting, dressing, self feeding, and reach over head  PARTICIPATION LIMITATIONS: meal prep, cleaning, laundry, shopping, and community activity  PERSONAL FACTORS: 1 comorbidity: Rt total shoulder replacement  are also affecting patient's functional outcome.   REHAB POTENTIAL: Good  CLINICAL DECISION MAKING:  Stable/uncomplicated  EVALUATION COMPLEXITY: Low  GOALS: Goals reviewed with patient? Yes  SHORT TERM GOALS: Target date: 07/01/2023    Be independent in initial HEP Baseline: Goal status: MET  2.  Report > or = to 30% use of Rt UE with light home and self-care tasks due to improve ROM and strength  Baseline:  Goal status: INITIAL  3.  Demonstrate Rt shoulder P/ROM flexion to > or = to 110 degrees to improve reaching  Baseline: 130 (06/23/23) Goal status: MET  4.  Perform Rt shoulder ER to back of head to improve independence with self-care Baseline: 32 (06/23/23) Goal status: In progress   LONG TERM GOALS: Target date: 07/29/2023    Be independent in advanced HEP Baseline:  Goal status: INITIAL  2.  Improve FOTO > or = to 59 to improve functional use of Rt UE Baseline: 40 Goal status: INITIAL  3. Improve Rt shoulder A/ROM flexion to > or = to 115 degrees to allow for reaching overhead into cabinets Baseline:  Goal status: INITIAL  4.  Report > or = to 70% of normal use of Rt UE for ADLs due to improved functional strength and A/ROM Baseline:  Goal status: INITIAL  5.  Demonstrate Rt shoulder A/ROM ER to T1 to improve self care Baseline:  Goal status: INITIAL  6.  Perform golf swing (chipping motion) without significant pain of substitution in Rt UE Baseline:  Goal status: INITIAL  PLAN: PT FREQUENCY: 2x/week  PT DURATION: 8 weeks  PLANNED INTERVENTIONS: 97110-Therapeutic exercises, 97530- Therapeutic activity, [id number]- Neuromuscular re-education, 97535- Self Care, 40981- Manual therapy, [phone]- Canalith repositioning, [id number]- Aquatic Therapy, 97014- Electrical stimulation (unattended), [phone]- Ionotophoresis 4mg /ml Dexamethasone, Patient/Family education, Taping, Dry Needling, Joint mobilization, Spinal mobilization, Vestibular training, Cryotherapy, and Moist heat  PLAN FOR NEXT SESSION: gentle progression of Rt UE strength and ROM as indicated by post-op  protocol.  Measure A/ROM  Lorrene Reid, PT 06/30/23 4:21 PM  [facility] [address], [address] [place], [place] [address] Phone # [phone] Fax [phone]

## 2023-07-05 ENCOUNTER — Ambulatory Visit: Payer: Medicare Other

## 2023-07-05 DIAGNOSIS — M6281 Muscle weakness (generalized): Secondary | ICD-10-CM | POA: Diagnosis not present

## 2023-07-05 DIAGNOSIS — M25511 Pain in right shoulder: Secondary | ICD-10-CM

## 2023-07-05 DIAGNOSIS — R293 Abnormal posture: Secondary | ICD-10-CM | POA: Diagnosis not present

## 2023-07-05 DIAGNOSIS — R252 Cramp and spasm: Secondary | ICD-10-CM | POA: Diagnosis not present

## 2023-07-05 DIAGNOSIS — M25611 Stiffness of right shoulder, not elsewhere classified: Secondary | ICD-10-CM

## 2023-07-05 NOTE — Therapy (Addendum)
OUTPATIENT PHYSICAL THERAPY TREATMENT   Patient Name: Tracey [name] MRN: [id number] DOB:Apr 22, 1946, 77 y.o., female Today's Date: 07/05/2023  END OF SESSION:  PT End of Session - 07/05/23 1232     Visit Number 9    Date for PT Re-Evaluation 07/29/23    Authorization Type Medicare-KX at 15    Progress Note Due on Visit 10    PT Start Time 1147    PT Stop Time 1232    PT Time Calculation (min) 45 min    Activity Tolerance Patient tolerated treatment well    Behavior During Therapy [facility] for tasks assessed/performed                     Past Medical History:  Diagnosis Date   Allergy    Anxiety    takes Ativan daily prn   Arthritis    Breast cancer (HCC) 2014   ER+/PR+/HEr2-,    Bruises easily    Colon polyps    2 polyps by report   Coronary artery disease involving native coronary artery of native heart without angina pectoris 01/01/2014   NSTEMI in January 2019 in [facility]>> s/p DES x2 to the LAD (procedure complicated by stent thrombosis) Myoview 05/2020: EF 72, no infarct or ischemia; low risk Echocardiogram 05/2019: EF 60-65, GLS -21.5 Cardiac catheterization at [facility] in 08/2017: Normal left main, normal LCx, normal RCA; LAD/diagonal 95%>> PCI    Diverticulosis    GERD (gastroesophageal reflux disease)    occasionally takes Nexium    Gout    Headache    MIgraines- no headaches- has floater in eyes   History of bladder infections    many yrs ago   History of bronchitis    last time at least 58yrs ago   History of hiatal hernia    History of stress incontinence    Hyperlipidemia    takes Pravastatin daily   Hypertension    Insomnia    takes Ambien nightly prn   NSTEMI (non-ST elevated myocardial infarction) (HCC) 08/2017   OSA (obstructive sleep apnea)    on CPAP   Osteoarthritis    Peripheral edema    takes Furosemide daily prn   PONV (postoperative nausea and vomiting)    Postmenopausal hormone therapy    Radiation  07/27/13-09/07/13   Right Breast x 31 treatments   Rhinitis    uses Flonase prn   Sinus congestion    Status post breast reconstruction 10/15/2014   Bilateral implant removal and DIEP performed in [place], [place]   Tachycardia    takes Metoprolol daily   Ventricular tachycardia, non-sustained (HCC)    during sleep study 2009 with normal cardiac workup   Past Surgical History:  Procedure Laterality Date   ABDOMINAL HYSTERECTOMY     with BSO   APPENDECTOMY     ARTERY BIOPSY Left 05/15/2022   Procedure: BIOPSY TEMPORAL ARTERY;  Surgeon: Cephus Shelling, MD;  Location: [facility] OR;  Service: Vascular;  Laterality: Left;   AXILLARY SENTINEL NODE BIOPSY Right 05/23/2013   Procedure: AXILLARY SENTINEL NODE BIOPSY;  Surgeon: Adolph Pollack, MD;  Location: [facility] OR;  Service: General;  Laterality: Right;  nuc med injection 7:00   BREAST RECONSTRUCTION WITH PLACEMENT OF TISSUE EXPANDER AND FLEX HD (ACELLULAR HYDRATED DERMIS) Bilateral 05/23/2013   Procedure: BILATERAL BREAST RECONSTRUCTION WITH PLACEMENT OF TISSUE EXPANDER AND FLEX HD;  Surgeon: Etter Sjogren, MD;  Location: [facility] OR;  Service: Plastics;  Laterality: Bilateral;  CARDIAC CATHETERIZATION  1999   CATARACT EXTRACTION, BILATERAL Bilateral    CHOLECYSTECTOMY     COSMETIC SURGERY     OTHER SURGICAL HISTORY  09/2017   had cyst removal from spine    PERCUTANEOUS CORONARY STENT INTERVENTION (PCI-S)  08/2017   done in [place]     REMOVAL OF BILATERAL TISSUE EXPANDERS WITH PLACEMENT OF BILATERAL BREAST IMPLANTS Bilateral 05/17/2014   Procedure: REMOVAL OF BILATERAL TISSUE EXPANDERS WITH PLACEMENT OF BILATERAL BREAST IMPLANTS FOR RECONSTRUCTION;  Surgeon: Etter Sjogren, MD;  Location: [facility] OR;  Service: Plastics;  Laterality: Bilateral;   REVERSE SHOULDER ARTHROPLASTY Right 05/13/2023   Procedure: REVERSE SHOULDER ARTHROPLASTY;  Surgeon: Francena Hanly, MD;  Location: [facility] ORS;  Service: Orthopedics;  Laterality: Right;   TONSILLECTOMY     TOTAL KNEE  ARTHROPLASTY Right 07/26/2018   Procedure: TOTAL KNEE ARTHROPLASTY;  Surgeon: Durene Romans, MD;  Location: [facility] ORS;  Service: Orthopedics;  Laterality: Right;  70 mins   TOTAL KNEE ARTHROPLASTY Left 12/02/2021   Procedure: TOTAL KNEE ARTHROPLASTY;  Surgeon: Durene Romans, MD;  Location: [facility] ORS;  Service: Orthopedics;  Laterality: Left;   TOTAL MASTECTOMY Bilateral 05/23/2013   Procedure: TOTAL MASTECTOMY;  Surgeon: Adolph Pollack, MD;  Location: [facility] OR;  Service: General;  Laterality: Bilateral;   Patient Active Problem List   Diagnosis Date Noted   CAP (community acquired pneumonia) 05/18/2023   Hypokalemia 05/18/2023   Anemia 05/18/2023   Acute hypoxemic respiratory failure (HCC) 05/17/2023   S/P reverse total shoulder arthroplasty, right 05/13/2023   Fever of unknown origin 06/10/2022   Stage 3b chronic kidney disease (HCC) 06/10/2022   ARF (acute renal failure) (HCC) 12/30/2021   DOE (dyspnea on exertion) 12/29/2021   S/P total knee arthroplasty, left 12/02/2021   Preoperative cardiovascular examination 11/18/2021   REM behavioral disorder 06/12/2021   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   CPAP (continuous positive airway pressure) dependence 01/20/2021   Unsteady gait when walking 01/20/2021   Complaint related to dreams 06/05/2019   Metabolic acidosis with increased anion gap and accumulation of organic acids 08/17/2018   Nausea, vomiting, and diarrhea 08/17/2018   AKI (acute kidney injury) (HCC) 08/17/2018   Overweight (BMI 25.0-29.9) 07/27/2018   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/27/2018   S/P right TKA 07/26/2018   S/P knee replacement 07/26/2018   UARS (upper airway resistance syndrome) 05/30/2018   Hyperlipidemia LDL goal <70 05/02/2018   Leg injury, right, initial encounter 01/06/2017   Contusion of right lower leg 01/06/2017   Hypoxemia 12/23/2015   Statin myopathy 05/20/2015   MCI (mild cognitive impairment)  05/20/2015   Edema 05/17/2015   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   Breast cancer genetic susceptibility 04/02/2015   OSA on CPAP 04/02/2015   Coronary artery disease involving native coronary artery with angina pectoris (HCC) 01/01/2014   Chest pain 01/01/2014   Essential hypertension    Ventricular tachycardia, non-sustained (HCC)    Breast cancer of upper-outer quadrant of right female breast (HCC) 06/28/2013   Postoperative visit 06/02/2013   Gout    Tachycardia     PCP: Alysia Penna, MD   REFERRING PROVIDER: Francena Hanly, MD  REFERRING DIAG: [phone] presence of Rt artificial joint  THERAPY DIAG:  Acute pain of right shoulder  Muscle weakness (generalized)  Stiffness of right shoulder, not elsewhere classified  Rationale for Evaluation and Treatment: Rehabilitation  ONSET DATE: Reverse total shoulder replacement 05/13/23  SUBJECTIVE:  SUBJECTIVE STATEMENT: I was using my arm to cheer at the football game and my arm has been sore.  I haven't been doing much with it due to pain.  Hand dominance: Right  PERTINENT HISTORY: Rt reverse total shoulder replacement 04/2023, Breast cancer 2014   PAIN: 07/05/23 Are you having pain? Yes: NPRS scale:0-6/10 neck and shoulder   Pain location: Rt neck, Rt shoulder  Pain description: shoulder aching  Aggravating factors: trying to use Rt shoulder Relieving factors: pain medication, ice  PRECAUTIONS: Other: No arm bike due to lymph node removal in 2014  Follow protocol- scanned in media Current 06/15/23: current P/ROM limitations: ER to 45, flexion to 115, abduction 90  RED FLAGS: None   WEIGHT BEARING RESTRICTIONS: No  FALLS:  Has patient fallen in last 6 months? No  LIVING ENVIRONMENT: Lives with: lives with their spouse Lives in:  House/apartment  OCCUPATION: Retired   PLOF: Independent and Leisure: golf  PATIENT GOALS: Rt shoulder strength, ROM, return to functional use  NEXT MD VISIT: 6 months?   OBJECTIVE:  Note: Objective measures were completed at Evaluation unless otherwise noted.  DIAGNOSTIC FINDINGS:  None   PATIENT SURVEYS :  06/03/23: FOTO 40 (goal is 28)  COGNITION: Overall cognitive status: Within functional limits for tasks assessed     SENSATION: WFL  POSTURE: Forward head   UPPER EXTREMITY ROM:   Passive ROM Right eval Right  06/23/23 Left eval  Shoulder flexion 85 with pain 130  Full throughout   Shoulder extension     Shoulder abduction 60- pain, protocol limit 85   Shoulder adduction     Shoulder internal rotation     Shoulder external rotation 20 degrees-Pain 32    Elbow flexion full    Elbow extension Full     Wrist flexion     Wrist extension     Wrist ulnar deviation     Wrist radial deviation     Wrist pronation     Wrist supination     (Blank rows = not tested)  UPPER EXTREMITY MMT: Not tested due to post-op   JOINT MOBILITY TESTING:  NA  PALPATION:  Tension and palpable tenderness over Rt neck, upper trap and globally about the Rt shoulder.  Well healing surgical incision with reduced mobility.    TODAY'S TREATMENT:    DATE: 07/05/23  Supine chest press with cane: x10, overhead flexion with cane x10 Supine: narrow chest press 2x10 Rt upper trap stretch 3x20 seconds Sidelying ER, flexion and abduction of Rt shoulder x 10 each Manual:  P/ROM into ER, abduction to tolerance Trigger Point Dry-Needling  Treatment instructions: Expect mild to moderate muscle soreness. S/S of pneumothorax if dry needled over a lung field, and to seek immediate medical attention should they occur. Patient verbalized understanding of these instructions and education.  Patient Consent Given: Yes Education handout provided: Yes Muscles treated: Rt upper trap , posterior  and lateral deltoid, infraspinatus Treatment response/outcome: Utilized skilled palpation to identify trigger points.  During dry needling able to palpate muscle twitch and muscle elongation  Elongation and release to Rt upper trap and shoulder  Skilled palpation and monitoring by PT during dry needling   DATE: 06/30/23 Finger ladder: flexion x10- scapular depression Simulation of taking a drink with 1# weight- tactile cues to use anterior shoulder Supine: narrow chest press 2x10 Supine: shoulder flexion 2x10 Rt upper trap stretch 3x20 seconds Sidelying ER, flexion and abduction of Rt shoulder x 10 each Manual:  P/ROM  into ER, abduction to tolerance   DATE: 06/28/23 UE on steps: flexion x10 Finger ladder: flexion and scaption x 5  Rt upper trap stretch 3x20 seconds Sidelying ER, flexion and abduction of Rt shoulder x 5 each ER stretch with cane in supine x10 Manual:  P/ROM into ER, abduction and flexion to protocol limits  PATIENT EDUCATION: Education details: Access Code: [email or web address], protocol limits and protection phase of rehab Person educated: Patient Education method: Explanation, Demonstration, and Handouts Education comprehension: verbalized understanding and returned demonstration  HOME EXERCISE PROGRAM: Access Code: [email or web address] URL: [email or web address] Date: 06/28/2023 Prepared by: Tresa Endo  Exercises - Seated Gripping Towel  - 3 x daily - 7 x weekly - 1 sets - 10 reps - 5shoulder ab hold - Isometric Shoulder Abduction at Wall  - 3 x daily - 7 x weekly - 1 sets - 10 reps - 5 hold - Seated Elbow Flexion and Extension AROM  - 3 x daily - 7 x weekly - 1-2 sets - 10 reps - Supine Shoulder Flexion PROM with Caregiver  - 1 x daily - 7 x weekly - 3 sets - 10 reps - Supine Shoulder Flexion AAROM with Hands Clasped  - 3 x daily - 7 x weekly - 1 sets - 10 reps - 5 hold - Seated Gentle Upper Trapezius Stretch  - 3 x daily - 7 x weekly - 1 sets - 3 reps - 20 hold -  Seated Cervical Rotation AROM  - 3 x daily - 7 x weekly - 1 sets - 3 reps - 20 hold - Seated Shoulder Flexion AAROM with Pulley Behind  - 3 x daily - 7 x weekly - 1 sets - 10 reps - Seated Shoulder Scaption AAROM with Pulley at Side  - 1 x daily - 7 x weekly - 3 sets - 10 reps - Shoulder Flexion Wall Slide with Towel  - 1-2 x daily - 7 x weekly - 1 sets - 5 reps - 5-10 hold - Scaption Wall Slide with Towel  - 1-2 x daily - 7 x weekly - 1 sets - 5 reps - 5-10 hold - Supine Shoulder External Rotation with Dowel  - 1-2 x daily - 7 x weekly - 1 sets - 5-10 reps - 5 hold - Sidelying Shoulder External Rotation  - 1-2 x daily - 7 x weekly - 1-2 sets - 5-10 reps - Sidelying Shoulder Flexion 15 Degrees  - 1 x daily - 7 x weekly - 1-2 sets - 5-10 reps - Sidelying Shoulder Abduction  - 1 x daily - 7 x weekly - 1-2 sets - 5-10 reps  ASSESSMENT:  CLINICAL IMPRESSION: Pt arrived with increased pain due to using the arm to cheer at a football game.  PT discussed how overuse will flare-up her pain.   Session today focused on gentle mobility and manual to address pain.  Pt had good response to DN with reduced pain after DN and P/ROM today.  Advance back to prior level of exercise next session of pain is reduced .  Patient will benefit from skilled PT to address the below impairments and improve overall function.   OBJECTIVE IMPAIRMENTS: decreased activity tolerance, decreased ROM, decreased strength, increased muscle spasms, impaired flexibility, impaired UE functional use, postural dysfunction, and pain.   ACTIVITY LIMITATIONS: carrying, lifting, dressing, self feeding, and reach over head  PARTICIPATION LIMITATIONS: meal prep, cleaning, laundry, shopping, and community activity  PERSONAL FACTORS: 1 comorbidity: Rt total shoulder replacement   are also  affecting patient's functional outcome.   REHAB POTENTIAL: Good  CLINICAL DECISION MAKING: Stable/uncomplicated  EVALUATION COMPLEXITY:  Low  GOALS: Goals reviewed with patient? Yes  SHORT TERM GOALS: Target date: 07/01/2023    Be independent in initial HEP Baseline: Goal status: MET  2.  Report > or = to 30% use of Rt UE with light home and self-care tasks due to improve ROM and strength  Baseline:  Goal status: INITIAL  3.  Demonstrate Rt shoulder P/ROM flexion to > or = to 110 degrees to improve reaching  Baseline: 130 (06/23/23) Goal status: MET  4.  Perform Rt shoulder ER to back of head to improve independence with self-care Baseline: 32 (06/23/23) Goal status: In progress   LONG TERM GOALS: Target date: 07/29/2023    Be independent in advanced HEP Baseline:  Goal status: INITIAL  2.  Improve FOTO > or = to 59 to improve functional use of Rt UE Baseline: 40 Goal status: INITIAL  3. Improve Rt shoulder A/ROM flexion to > or = to 115 degrees to allow for reaching overhead into cabinets Baseline:  Goal status: INITIAL  4.  Report > or = to 70% of normal use of Rt UE for ADLs due to improved functional strength and A/ROM Baseline:  Goal status: INITIAL  5.  Demonstrate Rt shoulder A/ROM ER to T1 to improve self care Baseline:  Goal status: INITIAL  6.  Perform golf swing (chipping motion) without significant pain of substitution in Rt UE Baseline:  Goal status: INITIAL  PLAN: PT FREQUENCY: 2x/week  PT DURATION: 8 weeks  PLANNED INTERVENTIONS: 97110-Therapeutic exercises, 97530- Therapeutic activity, [id number]- Neuromuscular re-education, 97535- Self Care, 16109- Manual therapy, [phone]- Canalith repositioning, [id number]- Aquatic Therapy, 97014- Electrical stimulation (unattended), [phone]- Ionotophoresis 4mg /ml Dexamethasone, Patient/Family education, Taping, Dry Needling, Joint mobilization, Spinal mobilization, Vestibular training, Cryotherapy, and Moist heat  PLAN FOR NEXT SESSION: gentle progression of Rt UE strength and ROM as indicated by post-op protocol.  Measure A/ROM next visit, not done today  due to increased pain.  10th visit next.   Lorrene Reid, PT 07/05/23 1:00 PM  [facility] [address] [place], [place] [address] Phone # [phone] Fax [phone]

## 2023-07-07 ENCOUNTER — Other Ambulatory Visit: Payer: Self-pay | Admitting: Cardiovascular Disease

## 2023-07-07 DIAGNOSIS — I25118 Atherosclerotic heart disease of native coronary artery with other forms of angina pectoris: Secondary | ICD-10-CM

## 2023-07-08 ENCOUNTER — Ambulatory Visit: Payer: Medicare Other

## 2023-07-08 DIAGNOSIS — R252 Cramp and spasm: Secondary | ICD-10-CM | POA: Diagnosis not present

## 2023-07-08 DIAGNOSIS — M25511 Pain in right shoulder: Secondary | ICD-10-CM | POA: Diagnosis not present

## 2023-07-08 DIAGNOSIS — M6281 Muscle weakness (generalized): Secondary | ICD-10-CM | POA: Diagnosis not present

## 2023-07-08 DIAGNOSIS — R293 Abnormal posture: Secondary | ICD-10-CM

## 2023-07-08 DIAGNOSIS — M25611 Stiffness of right shoulder, not elsewhere classified: Secondary | ICD-10-CM | POA: Diagnosis not present

## 2023-07-08 NOTE — Therapy (Signed)
OUTPATIENT PHYSICAL THERAPY TREATMENT Progress Note Reporting Period 06/03/23 to 07/08/23  See note below for Objective Data and Assessment of Progress/Goals.       Patient Name: Tracey [name] MRN: [id number] DOB:02-05-1946, 77 y.o., female Today's Date: 07/08/2023  END OF SESSION:  PT End of Session - 07/08/23 1620     Visit Number 10    Date for PT Re-Evaluation 07/29/23    Authorization Type Medicare-KX at 15    Progress Note Due on Visit 10    PT Start Time 1615    PT Stop Time 1700    PT Time Calculation (min) 45 min    Activity Tolerance Patient tolerated treatment well    Behavior During Therapy [facility] for tasks assessed/performed                     Past Medical History:  Diagnosis Date   Allergy    Anxiety    takes Ativan daily prn   Arthritis    Breast cancer (HCC) 2014   ER+/PR+/HEr2-,    Bruises easily    Colon polyps    2 polyps by report   Coronary artery disease involving native coronary artery of native heart without angina pectoris 01/01/2014   NSTEMI in January 2019 in [place]>> s/p DES x2 to the LAD (procedure complicated by stent thrombosis) Myoview 05/2020: EF 72, no infarct or ischemia; low risk Echocardiogram 05/2019: EF 60-65, GLS -21.5 Cardiac catheterization at [facility] in 08/2017: Normal left main, normal LCx, normal RCA; LAD/diagonal 95%>> PCI    Diverticulosis    GERD (gastroesophageal reflux disease)    occasionally takes Nexium    Gout    Headache    MIgraines- no headaches- has floater in eyes   History of bladder infections    many yrs ago   History of bronchitis    last time at least 70yrs ago   History of hiatal hernia    History of stress incontinence    Hyperlipidemia    takes Pravastatin daily   Hypertension    Insomnia    takes Ambien nightly prn   NSTEMI (non-ST elevated myocardial infarction) (HCC) 08/2017   OSA (obstructive sleep apnea)    on CPAP   Osteoarthritis    Peripheral  edema    takes Furosemide daily prn   PONV (postoperative nausea and vomiting)    Postmenopausal hormone therapy    Radiation 07/27/13-09/07/13   Right Breast x 31 treatments   Rhinitis    uses Flonase prn   Sinus congestion    Status post breast reconstruction 10/15/2014   Bilateral implant removal and DIEP performed in [place], [place]   Tachycardia    takes Metoprolol daily   Ventricular tachycardia, non-sustained (HCC)    during sleep study 2009 with normal cardiac workup   Past Surgical History:  Procedure Laterality Date   ABDOMINAL HYSTERECTOMY     with BSO   APPENDECTOMY     ARTERY BIOPSY Left 05/15/2022   Procedure: BIOPSY TEMPORAL ARTERY;  Surgeon: Cephus Shelling, MD;  Location: [facility] OR;  Service: Vascular;  Laterality: Left;   AXILLARY SENTINEL NODE BIOPSY Right 05/23/2013   Procedure: AXILLARY SENTINEL NODE BIOPSY;  Surgeon: Adolph Pollack, MD;  Location: [facility] OR;  Service: General;  Laterality: Right;  nuc med injection 7:00   BREAST RECONSTRUCTION WITH PLACEMENT OF TISSUE EXPANDER AND FLEX HD (ACELLULAR HYDRATED DERMIS) Bilateral 05/23/2013   Procedure: BILATERAL BREAST RECONSTRUCTION WITH PLACEMENT OF  TISSUE EXPANDER AND FLEX HD;  Surgeon: Etter Sjogren, MD;  Location: [facility] OR;  Service: Plastics;  Laterality: Bilateral;   CARDIAC CATHETERIZATION  1999   CATARACT EXTRACTION, BILATERAL Bilateral    CHOLECYSTECTOMY     COSMETIC SURGERY     OTHER SURGICAL HISTORY  09/2017   had cyst removal from spine    PERCUTANEOUS CORONARY STENT INTERVENTION (PCI-S)  08/2017   done in [place]     REMOVAL OF BILATERAL TISSUE EXPANDERS WITH PLACEMENT OF BILATERAL BREAST IMPLANTS Bilateral 05/17/2014   Procedure: REMOVAL OF BILATERAL TISSUE EXPANDERS WITH PLACEMENT OF BILATERAL BREAST IMPLANTS FOR RECONSTRUCTION;  Surgeon: Etter Sjogren, MD;  Location: [facility] OR;  Service: Plastics;  Laterality: Bilateral;   REVERSE SHOULDER ARTHROPLASTY Right 05/13/2023   Procedure: REVERSE SHOULDER ARTHROPLASTY;   Surgeon: Francena Hanly, MD;  Location: [facility] ORS;  Service: Orthopedics;  Laterality: Right;   TONSILLECTOMY     TOTAL KNEE ARTHROPLASTY Right 07/26/2018   Procedure: TOTAL KNEE ARTHROPLASTY;  Surgeon: Durene Romans, MD;  Location: [facility] ORS;  Service: Orthopedics;  Laterality: Right;  70 mins   TOTAL KNEE ARTHROPLASTY Left 12/02/2021   Procedure: TOTAL KNEE ARTHROPLASTY;  Surgeon: Durene Romans, MD;  Location: [facility] ORS;  Service: Orthopedics;  Laterality: Left;   TOTAL MASTECTOMY Bilateral 05/23/2013   Procedure: TOTAL MASTECTOMY;  Surgeon: Adolph Pollack, MD;  Location: [facility] OR;  Service: General;  Laterality: Bilateral;   Patient Active Problem List   Diagnosis Date Noted   CAP (community acquired pneumonia) 05/18/2023   Hypokalemia 05/18/2023   Anemia 05/18/2023   Acute hypoxemic respiratory failure (HCC) 05/17/2023   S/P reverse total shoulder arthroplasty, right 05/13/2023   Fever of unknown origin 06/10/2022   Stage 3b chronic kidney disease (HCC) 06/10/2022   ARF (acute renal failure) (HCC) 12/30/2021   DOE (dyspnea on exertion) 12/29/2021   S/P total knee arthroplasty, left 12/02/2021   Preoperative cardiovascular examination 11/18/2021   REM behavioral disorder 06/12/2021   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   CPAP (continuous positive airway pressure) dependence 01/20/2021   Unsteady gait when walking 01/20/2021   Complaint related to dreams 06/05/2019   Metabolic acidosis with increased anion gap and accumulation of organic acids 08/17/2018   Nausea, vomiting, and diarrhea 08/17/2018   AKI (acute kidney injury) (HCC) 08/17/2018   Overweight (BMI 25.0-29.9) 07/27/2018   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/27/2018   S/P right TKA 07/26/2018   S/P knee replacement 07/26/2018   UARS (upper airway resistance syndrome) 05/30/2018   Hyperlipidemia LDL goal <70 05/02/2018   Leg injury, right, initial encounter 01/06/2017   Contusion of  right lower leg 01/06/2017   Hypoxemia 12/23/2015   Statin myopathy 05/20/2015   MCI (mild cognitive impairment) 05/20/2015   Edema 05/17/2015   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   Breast cancer genetic susceptibility 04/02/2015   OSA on CPAP 04/02/2015   Coronary artery disease involving native coronary artery with angina pectoris (HCC) 01/01/2014   Chest pain 01/01/2014   Essential hypertension    Ventricular tachycardia, non-sustained (HCC)    Breast cancer of upper-outer quadrant of right female breast (HCC) 06/28/2013   Postoperative visit 06/02/2013   Gout    Tachycardia     PCP: Alysia Penna, MD   REFERRING PROVIDER: Francena Hanly, MD  REFERRING DIAG: [phone] presence of Rt artificial joint  THERAPY DIAG:  Abnormal posture  Cramp and spasm  Muscle weakness (generalized)  Acute pain of right shoulder  Stiffness  of right shoulder, not elsewhere classified  Rationale for Evaluation and Treatment: Rehabilitation  ONSET DATE: Reverse total shoulder replacement 05/13/23  SUBJECTIVE:                                                                                                                                                                                      SUBJECTIVE STATEMENT: Patient states she is still sore from using her arm a little more last week.   Hand dominance: Right  PERTINENT HISTORY: Rt reverse total shoulder replacement 04/2023, Breast cancer 2014   PAIN: 07/08/23 Are you having pain? Yes: NPRS scale:4/10 neck and shoulder   Pain location: Rt neck, Rt shoulder  Pain description: shoulder aching  Aggravating factors: trying to use Rt shoulder Relieving factors: pain medication, ice  PRECAUTIONS: Other: No arm bike due to lymph node removal in 2014  Follow protocol- scanned in media Current 06/15/23: current P/ROM limitations: ER to 45, flexion to 115, abduction 90  RED FLAGS: None   WEIGHT BEARING RESTRICTIONS: No  FALLS:  Has  patient fallen in last 6 months? No  LIVING ENVIRONMENT: Lives with: lives with their spouse Lives in: House/apartment  OCCUPATION: Retired   PLOF: Independent and Leisure: golf  PATIENT GOALS: Rt shoulder strength, ROM, return to functional use  NEXT MD VISIT: 6 months?   OBJECTIVE:  Note: Objective measures were completed at Evaluation unless otherwise noted.  DIAGNOSTIC FINDINGS:  None   PATIENT SURVEYS :  06/03/23: FOTO 40 (goal is 13)  COGNITION: Overall cognitive status: Within functional limits for tasks assessed     SENSATION: WFL  POSTURE: Forward head   UPPER EXTREMITY ROM:   Passive ROM Right eval Right  06/23/23 Left eval A/PROM 07/08/23 Left  Shoulder flexion 85 with pain 130  Full throughout  145/150  Shoulder extension      Shoulder abduction 60- pain, protocol limit 85  101/103  Shoulder adduction      Shoulder internal rotation    Hand to hip  Shoulder external rotation 20 degrees-Pain 32   17/30  Elbow flexion full     Elbow extension Full      Wrist flexion      Wrist extension      Wrist ulnar deviation      Wrist radial deviation      Wrist pronation      Wrist supination      (Blank rows = not tested)  UPPER EXTREMITY MMT: Not tested due to post-op   JOINT MOBILITY TESTING:  NA  PALPATION:  Tension and palpable tenderness over Rt neck, upper trap and globally about the Rt shoulder.  Well healing  surgical incision with reduced mobility.    TODAY'S TREATMENT:    DATE: 07/08/23 UBE slowly x 3 min  10th visit re-assessment PROM supine: all 4 planes of motion Added side lying active flexion 2 sets of 10 Supine active flexion x 10  DATE: 07/05/23  Supine chest press with cane: x10, overhead flexion with cane x10 Supine: narrow chest press 2x10 Rt upper trap stretch 3x20 seconds Sidelying ER, flexion and abduction of Rt shoulder x 10 each Manual:  P/ROM into ER, abduction to tolerance Trigger Point Dry-Needling   Treatment instructions: Expect mild to moderate muscle soreness. S/S of pneumothorax if dry needled over a lung field, and to seek immediate medical attention should they occur. Patient verbalized understanding of these instructions and education.  Patient Consent Given: Yes Education handout provided: Yes Muscles treated: Rt upper trap , posterior and lateral deltoid, infraspinatus Treatment response/outcome: Utilized skilled palpation to identify trigger points.  During dry needling able to palpate muscle twitch and muscle elongation  Elongation and release to Rt upper trap and shoulder  Skilled palpation and monitoring by PT during dry needling   DATE: 06/30/23 Finger ladder: flexion x10- scapular depression Simulation of taking a drink with 1# weight- tactile cues to use anterior shoulder Supine: narrow chest press 2x10 Supine: shoulder flexion 2x10 Rt upper trap stretch 3x20 seconds Sidelying ER, flexion and abduction of Rt shoulder x 10 each Manual:  P/ROM into ER, abduction to tolerance   DATE: 06/28/23 UE on steps: flexion x10 Finger ladder: flexion and scaption x 5  Rt upper trap stretch 3x20 seconds Sidelying ER, flexion and abduction of Rt shoulder x 5 each ER stretch with cane in supine x10 Manual:  P/ROM into ER, abduction and flexion to protocol limits  PATIENT EDUCATION: Education details: Access Code: [email or web address], protocol limits and protection phase of rehab Person educated: Patient Education method: Explanation, Demonstration, and Handouts Education comprehension: verbalized understanding and returned demonstration  HOME EXERCISE PROGRAM: Access Code: [email or web address] URL: [email or web address] Date: 06/28/2023 Prepared by: Tresa Endo  Exercises - Seated Gripping Towel  - 3 x daily - 7 x weekly - 1 sets - 10 reps - 5shoulder ab hold - Isometric Shoulder Abduction at Wall  - 3 x daily - 7 x weekly - 1 sets - 10 reps - 5 hold - Seated Elbow Flexion and  Extension AROM  - 3 x daily - 7 x weekly - 1-2 sets - 10 reps - Supine Shoulder Flexion PROM with Caregiver  - 1 x daily - 7 x weekly - 3 sets - 10 reps - Supine Shoulder Flexion AAROM with Hands Clasped  - 3 x daily - 7 x weekly - 1 sets - 10 reps - 5 hold - Seated Gentle Upper Trapezius Stretch  - 3 x daily - 7 x weekly - 1 sets - 3 reps - 20 hold - Seated Cervical Rotation AROM  - 3 x daily - 7 x weekly - 1 sets - 3 reps - 20 hold - Seated Shoulder Flexion AAROM with Pulley Behind  - 3 x daily - 7 x weekly - 1 sets - 10 reps - Seated Shoulder Scaption AAROM with Pulley at Side  - 1 x daily - 7 x weekly - 3 sets - 10 reps - Shoulder Flexion Wall Slide with Towel  - 1-2 x daily - 7 x weekly - 1 sets - 5 reps - 5-10 hold - Scaption Wall Slide with Towel  - 1-2 x daily - 7 x weekly -  1 sets - 5 reps - 5-10 hold - Supine Shoulder External Rotation with Dowel  - 1-2 x daily - 7 x weekly - 1 sets - 5-10 reps - 5 hold - Sidelying Shoulder External Rotation  - 1-2 x daily - 7 x weekly - 1-2 sets - 5-10 reps - Sidelying Shoulder Flexion 15 Degrees  - 1 x daily - 7 x weekly - 1-2 sets - 5-10 reps - Sidelying Shoulder Abduction  - 1 x daily - 7 x weekly - 1-2 sets - 5-10 reps  ASSESSMENT:  CLINICAL IMPRESSION: Patient continues to have some soreness from using her arm more over the past week but this has improved.  Progress note was completed.  Patient shows significant progress with AROM.  She is now 8 weeks post op.  New post 8 week exercises were added from protocol.  She completed these with ease.  She is compliant and should continue to do well.   Patient will benefit from skilled PT to address the below impairments and improve overall function.   OBJECTIVE IMPAIRMENTS: decreased activity tolerance, decreased ROM, decreased strength, increased muscle spasms, impaired flexibility, impaired UE functional use, postural dysfunction, and pain.   ACTIVITY LIMITATIONS: carrying, lifting, dressing, self  feeding, and reach over head  PARTICIPATION LIMITATIONS: meal prep, cleaning, laundry, shopping, and community activity  PERSONAL FACTORS: 1 comorbidity: Rt total shoulder replacement   are also affecting patient's functional outcome.   REHAB POTENTIAL: Good  CLINICAL DECISION MAKING: Stable/uncomplicated  EVALUATION COMPLEXITY: Low  GOALS: Goals reviewed with patient? Yes  SHORT TERM GOALS: Target date: 07/01/2023    Be independent in initial HEP Baseline: Goal status: MET  2.  Report > or = to 30% use of Rt UE with light home and self-care tasks due to improve ROM and strength  Baseline:  Goal status: INITIAL  3.  Demonstrate Rt shoulder P/ROM flexion to > or = to 110 degrees to improve reaching  Baseline: 130 (06/23/23) Goal status: MET  4.  Perform Rt shoulder ER to back of head to improve independence with self-care Baseline: 32 (06/23/23) Goal status: In progress   LONG TERM GOALS: Target date: 07/29/2023    Be independent in advanced HEP Baseline:  Goal status: INITIAL  2.  Improve FOTO > or = to 59 to improve functional use of Rt UE Baseline: 40 Goal status: INITIAL  3. Improve Rt shoulder A/ROM flexion to > or = to 115 degrees to allow for reaching overhead into cabinets Baseline:  Goal status: INITIAL  4.  Report > or = to 70% of normal use of Rt UE for ADLs due to improved functional strength and A/ROM Baseline:  Goal status: INITIAL  5.  Demonstrate Rt shoulder A/ROM ER to T1 to improve self care Baseline:  Goal status: INITIAL  6.  Perform golf swing (chipping motion) without significant pain of substitution in Rt UE Baseline:  Goal status: INITIAL  PLAN: PT FREQUENCY: 2x/week  PT DURATION: 8 weeks  PLANNED INTERVENTIONS: 97110-Therapeutic exercises, 97530- Therapeutic activity, [id number]- Neuromuscular re-education, 97535- Self Care, 65784- Manual therapy, [id number]- Canalith repositioning, [id number]- Aquatic Therapy, 97014- Electrical stimulation  (unattended), 97033- Ionotophoresis 4mg /ml Dexamethasone, Patient/Family education, Taping, Dry Needling, Joint mobilization, Spinal mobilization, Vestibular training, Cryotherapy, and Moist heat  PLAN FOR NEXT SESSION: gentle progression of Rt UE strength and ROM as indicated by post-op protocol.  Measure A/ROM next visit, not done today due to increased pain.  10th visit next.   Victorino Dike B. Barnett Elzey, PT  07/08/23 8:57 PM [facility] [address] [place], [place] [address] Phone # [phone] Fax [phone]

## 2023-07-13 DIAGNOSIS — E663 Overweight: Secondary | ICD-10-CM | POA: Diagnosis not present

## 2023-07-13 DIAGNOSIS — R5383 Other fatigue: Secondary | ICD-10-CM | POA: Diagnosis not present

## 2023-07-13 DIAGNOSIS — M2540 Effusion, unspecified joint: Secondary | ICD-10-CM | POA: Diagnosis not present

## 2023-07-13 DIAGNOSIS — M2560 Stiffness of unspecified joint, not elsewhere classified: Secondary | ICD-10-CM | POA: Diagnosis not present

## 2023-07-13 DIAGNOSIS — R768 Other specified abnormal immunological findings in serum: Secondary | ICD-10-CM | POA: Diagnosis not present

## 2023-07-13 DIAGNOSIS — M064 Inflammatory polyarthropathy: Secondary | ICD-10-CM | POA: Diagnosis not present

## 2023-07-13 DIAGNOSIS — Z6827 Body mass index (BMI) 27.0-27.9, adult: Secondary | ICD-10-CM | POA: Diagnosis not present

## 2023-07-13 DIAGNOSIS — M329 Systemic lupus erythematosus, unspecified: Secondary | ICD-10-CM | POA: Diagnosis not present

## 2023-07-13 DIAGNOSIS — H539 Unspecified visual disturbance: Secondary | ICD-10-CM | POA: Diagnosis not present

## 2023-07-13 DIAGNOSIS — M0609 Rheumatoid arthritis without rheumatoid factor, multiple sites: Secondary | ICD-10-CM | POA: Diagnosis not present

## 2023-07-13 DIAGNOSIS — M353 Polymyalgia rheumatica: Secondary | ICD-10-CM | POA: Diagnosis not present

## 2023-07-19 ENCOUNTER — Ambulatory Visit: Payer: Medicare Other | Attending: Orthopedic Surgery

## 2023-07-19 DIAGNOSIS — M25611 Stiffness of right shoulder, not elsewhere classified: Secondary | ICD-10-CM | POA: Insufficient documentation

## 2023-07-19 DIAGNOSIS — R252 Cramp and spasm: Secondary | ICD-10-CM | POA: Insufficient documentation

## 2023-07-19 DIAGNOSIS — R293 Abnormal posture: Secondary | ICD-10-CM | POA: Insufficient documentation

## 2023-07-19 DIAGNOSIS — M25511 Pain in right shoulder: Secondary | ICD-10-CM | POA: Diagnosis not present

## 2023-07-19 DIAGNOSIS — M6281 Muscle weakness (generalized): Secondary | ICD-10-CM | POA: Insufficient documentation

## 2023-07-19 NOTE — Therapy (Signed)
OUTPATIENT PHYSICAL THERAPY TREATMENT Progress Note Reporting Period 06/03/23 to 07/08/23  See note below for Objective Data and Assessment of Progress/Goals.       Patient Name: Tracey [name] MRN: [id number] DOB:05/20/1946, 77 y.o., female Today's Date: 07/19/2023  END OF SESSION:  PT End of Session - 07/19/23 1230     Visit Number 11    Date for PT Re-Evaluation 07/29/23    Authorization Type Medicare-KX at 15    Progress Note Due on Visit 10    PT Start Time 1146    PT Stop Time 1231    PT Time Calculation (min) 45 min    Activity Tolerance Patient tolerated treatment well    Behavior During Therapy [facility] for tasks assessed/performed                      Past Medical History:  Diagnosis Date   Allergy    Anxiety    takes Ativan daily prn   Arthritis    Breast cancer (HCC) 2014   ER+/PR+/HEr2-,    Bruises easily    Colon polyps    2 polyps by report   Coronary artery disease involving native coronary artery of native heart without angina pectoris 01/01/2014   NSTEMI in January 2019 in [place] [place]>> s/p DES x2 to the LAD (procedure complicated by stent thrombosis) Myoview 05/2020: EF 72, no infarct or ischemia; low risk Echocardiogram 05/2019: EF 60-65, GLS -21.5 Cardiac catheterization at [facility] in 08/2017: Normal left main, normal LCx, normal RCA; LAD/diagonal 95%>> PCI    Diverticulosis    GERD (gastroesophageal reflux disease)    occasionally takes Nexium    Gout    Headache    MIgraines- no headaches- has floater in eyes   History of bladder infections    many yrs ago   History of bronchitis    last time at least 63yrs ago   History of hiatal hernia    History of stress incontinence    Hyperlipidemia    takes Pravastatin daily   Hypertension    Insomnia    takes Ambien nightly prn   NSTEMI (non-ST elevated myocardial infarction) (HCC) 08/2017   OSA (obstructive sleep apnea)    on CPAP   Osteoarthritis    Peripheral  edema    takes Furosemide daily prn   PONV (postoperative nausea and vomiting)    Postmenopausal hormone therapy    Radiation 07/27/13-09/07/13   Right Breast x 31 treatments   Rhinitis    uses Flonase prn   Sinus congestion    Status post breast reconstruction 10/15/2014   Bilateral implant removal and DIEP performed in [place], [place]   Tachycardia    takes Metoprolol daily   Ventricular tachycardia, non-sustained (HCC)    during sleep study 2009 with normal cardiac workup   Past Surgical History:  Procedure Laterality Date   ABDOMINAL HYSTERECTOMY     with BSO   APPENDECTOMY     ARTERY BIOPSY Left 05/15/2022   Procedure: BIOPSY TEMPORAL ARTERY;  Surgeon: Cephus Shelling, MD;  Location: [facility] OR;  Service: Vascular;  Laterality: Left;   AXILLARY SENTINEL NODE BIOPSY Right 05/23/2013   Procedure: AXILLARY SENTINEL NODE BIOPSY;  Surgeon: Adolph Pollack, MD;  Location: [facility] OR;  Service: General;  Laterality: Right;  nuc med injection 7:00   BREAST RECONSTRUCTION WITH PLACEMENT OF TISSUE EXPANDER AND FLEX HD (ACELLULAR HYDRATED DERMIS) Bilateral 05/23/2013   Procedure: BILATERAL BREAST RECONSTRUCTION WITH PLACEMENT  OF TISSUE EXPANDER AND FLEX HD;  Surgeon: Etter Sjogren, MD;  Location: [facility] OR;  Service: Plastics;  Laterality: Bilateral;   CARDIAC CATHETERIZATION  1999   CATARACT EXTRACTION, BILATERAL Bilateral    CHOLECYSTECTOMY     COSMETIC SURGERY     OTHER SURGICAL HISTORY  09/2017   had cyst removal from spine    PERCUTANEOUS CORONARY STENT INTERVENTION (PCI-S)  08/2017   done in [place]     REMOVAL OF BILATERAL TISSUE EXPANDERS WITH PLACEMENT OF BILATERAL BREAST IMPLANTS Bilateral 05/17/2014   Procedure: REMOVAL OF BILATERAL TISSUE EXPANDERS WITH PLACEMENT OF BILATERAL BREAST IMPLANTS FOR RECONSTRUCTION;  Surgeon: Etter Sjogren, MD;  Location: [facility] OR;  Service: Plastics;  Laterality: Bilateral;   REVERSE SHOULDER ARTHROPLASTY Right 05/13/2023   Procedure: REVERSE SHOULDER ARTHROPLASTY;   Surgeon: Francena Hanly, MD;  Location: [facility] ORS;  Service: Orthopedics;  Laterality: Right;   TONSILLECTOMY     TOTAL KNEE ARTHROPLASTY Right 07/26/2018   Procedure: TOTAL KNEE ARTHROPLASTY;  Surgeon: Durene Romans, MD;  Location: [facility] ORS;  Service: Orthopedics;  Laterality: Right;  70 mins   TOTAL KNEE ARTHROPLASTY Left 12/02/2021   Procedure: TOTAL KNEE ARTHROPLASTY;  Surgeon: Durene Romans, MD;  Location: [facility] ORS;  Service: Orthopedics;  Laterality: Left;   TOTAL MASTECTOMY Bilateral 05/23/2013   Procedure: TOTAL MASTECTOMY;  Surgeon: Adolph Pollack, MD;  Location: [facility] OR;  Service: General;  Laterality: Bilateral;   Patient Active Problem List   Diagnosis Date Noted   CAP (community acquired pneumonia) 05/18/2023   Hypokalemia 05/18/2023   Anemia 05/18/2023   Acute hypoxemic respiratory failure (HCC) 05/17/2023   S/P reverse total shoulder arthroplasty, right 05/13/2023   Fever of unknown origin 06/10/2022   Stage 3b chronic kidney disease (HCC) 06/10/2022   ARF (acute renal failure) (HCC) 12/30/2021   DOE (dyspnea on exertion) 12/29/2021   S/P total knee arthroplasty, left 12/02/2021   Preoperative cardiovascular examination 11/18/2021   REM behavioral disorder 06/12/2021   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   CPAP (continuous positive airway pressure) dependence 01/20/2021   Unsteady gait when walking 01/20/2021   Complaint related to dreams 06/05/2019   Metabolic acidosis with increased anion gap and accumulation of organic acids 08/17/2018   Nausea, vomiting, and diarrhea 08/17/2018   AKI (acute kidney injury) (HCC) 08/17/2018   Overweight (BMI 25.0-29.9) 07/27/2018   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/27/2018   S/P right TKA 07/26/2018   S/P knee replacement 07/26/2018   UARS (upper airway resistance syndrome) 05/30/2018   Hyperlipidemia LDL goal <70 05/02/2018   Leg injury, right, initial encounter 01/06/2017   Contusion of  right lower leg 01/06/2017   Hypoxemia 12/23/2015   Statin myopathy 05/20/2015   MCI (mild cognitive impairment) 05/20/2015   Edema 05/17/2015   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   Breast cancer genetic susceptibility 04/02/2015   OSA on CPAP 04/02/2015   Coronary artery disease involving native coronary artery with angina pectoris (HCC) 01/01/2014   Chest pain 01/01/2014   Essential hypertension    Ventricular tachycardia, non-sustained (HCC)    Breast cancer of upper-outer quadrant of right female breast (HCC) 06/28/2013   Postoperative visit 06/02/2013   Gout    Tachycardia     PCP: Alysia Penna, MD   REFERRING PROVIDER: Francena Hanly, MD  REFERRING DIAG: [phone] presence of Rt artificial joint  THERAPY DIAG:  Abnormal posture  Cramp and spasm  Muscle weakness (generalized)  Acute pain of right shoulder  Stiffness of right shoulder, not elsewhere classified  Rationale for Evaluation and Treatment: Rehabilitation  ONSET DATE: Reverse total shoulder replacement 05/13/23  SUBJECTIVE:                                                                                                                                                                                      SUBJECTIVE STATEMENT: I can do more with my Rt arm.  I am still having trouble reaching behind my back and lifting my arm to drink and eat. I have to leave 07/27/23 for [place] Hand dominance: Right  PERTINENT HISTORY: Rt reverse total shoulder replacement 04/2023, Breast cancer 2014   PAIN: 07/19/23 Are you having pain? Yes: NPRS scale:4/10 neck and shoulder   Pain location: Rt neck, Rt shoulder  Pain description: shoulder aching  Aggravating factors: trying to use Rt shoulder Relieving factors: pain medication, ice  PRECAUTIONS: Other: No arm bike due to lymph node removal in 2014  Follow protocol- scanned in media Current 06/15/23: current P/ROM limitations: ER to 45, flexion to 115,  abduction 90  RED FLAGS: None   WEIGHT BEARING RESTRICTIONS: No  FALLS:  Has patient fallen in last 6 months? No  LIVING ENVIRONMENT: Lives with: lives with their spouse Lives in: House/apartment  OCCUPATION: Retired   PLOF: Independent and Leisure: golf  PATIENT GOALS: Rt shoulder strength, ROM, return to functional use  NEXT MD VISIT: 6 months?   OBJECTIVE:  Note: Objective measures were completed at Evaluation unless otherwise noted.  DIAGNOSTIC FINDINGS:  None   PATIENT SURVEYS :  06/03/23: FOTO 40 (goal is 35)  COGNITION: Overall cognitive status: Within functional limits for tasks assessed     SENSATION: WFL  POSTURE: Forward head   UPPER EXTREMITY ROM:   Passive ROM Right eval Right  06/23/23 Left eval A/PROM 07/08/23 Left  Shoulder flexion 85 with pain 130  Full throughout  145/150  Shoulder extension      Shoulder abduction 60- pain, protocol limit 85  101/103  Shoulder adduction      Shoulder internal rotation    Hand to hip  Shoulder external rotation 20 degrees-Pain 32   17/30  Elbow flexion full     Elbow extension Full      Wrist flexion      Wrist extension      Wrist ulnar deviation      Wrist radial deviation      Wrist pronation      Wrist supination      (Blank rows = not tested)  UPPER EXTREMITY MMT: Not tested due to post-op   JOINT MOBILITY TESTING:  NA  PALPATION:  Tension  and palpable tenderness over Rt neck, upper trap and globally about the Rt shoulder.  Well healing surgical incision with reduced mobility.    TODAY'S TREATMENT:    DATE: 07/19/23 IR with pulley: x 3 minutes- tactile cues required Finger ladder: added 1# x7 PROM supine: all 4 planes of motion Added side lying active flexion 2 sets of 10 Seated flexion and scaption 1# added 2x5  DATE: 07/08/23 UBE slowly x 3 min  10th visit re-assessment PROM supine: all 4 planes of motion Added side lying active flexion 2 sets of 10 Supine active flexion x  10  DATE: 07/05/23  Supine chest press with cane: x10, overhead flexion with cane x10 Supine: narrow chest press 2x10 Rt upper trap stretch 3x20 seconds Sidelying ER, flexion and abduction of Rt shoulder x 10 each Manual:  P/ROM into ER, abduction to tolerance Trigger Point Dry-Needling  Treatment instructions: Expect mild to moderate muscle soreness. S/S of pneumothorax if dry needled over a lung field, and to seek immediate medical attention should they occur. Patient verbalized understanding of these instructions and education.  Patient Consent Given: Yes Education handout provided: Yes Muscles treated: Rt upper trap , posterior and lateral deltoid, infraspinatus Treatment response/outcome: Utilized skilled palpation to identify trigger points.  During dry needling able to palpate muscle twitch and muscle elongation  Elongation and release to Rt upper trap and shoulder  Skilled palpation and monitoring by PT during dry needling    PATIENT EDUCATION: Education details: Access Code: [email or web address], protocol limits and protection phase of rehab Person educated: Patient Education method: Explanation, Demonstration, and Handouts Education comprehension: verbalized understanding and returned demonstration  HOME EXERCISE PROGRAM: Access Code: [email or web address] URL: [email or web address] Date: 06/28/2023 Prepared by: Tresa Endo  Exercises - Seated Gripping Towel  - 3 x daily - 7 x weekly - 1 sets - 10 reps - 5shoulder ab hold - Isometric Shoulder Abduction at Wall  - 3 x daily - 7 x weekly - 1 sets - 10 reps - 5 hold - Seated Elbow Flexion and Extension AROM  - 3 x daily - 7 x weekly - 1-2 sets - 10 reps - Supine Shoulder Flexion PROM with Caregiver  - 1 x daily - 7 x weekly - 3 sets - 10 reps - Supine Shoulder Flexion AAROM with Hands Clasped  - 3 x daily - 7 x weekly - 1 sets - 10 reps - 5 hold - Seated Gentle Upper Trapezius Stretch  - 3 x daily - 7 x weekly - 1 sets - 3 reps - 20  hold - Seated Cervical Rotation AROM  - 3 x daily - 7 x weekly - 1 sets - 3 reps - 20 hold - Seated Shoulder Flexion AAROM with Pulley Behind  - 3 x daily - 7 x weekly - 1 sets - 10 reps - Seated Shoulder Scaption AAROM with Pulley at Side  - 1 x daily - 7 x weekly - 3 sets - 10 reps - Shoulder Flexion Wall Slide with Towel  - 1-2 x daily - 7 x weekly - 1 sets - 5 reps - 5-10 hold - Scaption Wall Slide with Towel  - 1-2 x daily - 7 x weekly - 1 sets - 5 reps - 5-10 hold - Supine Shoulder External Rotation with Dowel  - 1-2 x daily - 7 x weekly - 1 sets - 5-10 reps - 5 hold - Sidelying Shoulder External Rotation  - 1-2 x daily - 7 x weekly - 1-2 sets -  5-10 reps - Sidelying Shoulder Flexion 15 Degrees  - 1 x daily - 7 x weekly - 1-2 sets - 5-10 reps - Sidelying Shoulder Abduction  - 1 x daily - 7 x weekly - 1-2 sets - 5-10 reps  ASSESSMENT:  CLINICAL IMPRESSION: Pt reports that she is using her Rt UE at home for more tasks.  She is limited with eating  Patient shows significant progress with AROM.  She did well with advancement of exercises with addition of weight today.  She will work on more specific tasks at home such as reaching behind back and fastening seat belt. Pt required few tactile cues for scapular position.  Pt will attend 1 more session before going to [place].  Patient will benefit from skilled PT to address the below impairments and improve overall function.   OBJECTIVE IMPAIRMENTS: decreased activity tolerance, decreased ROM, decreased strength, increased muscle spasms, impaired flexibility, impaired UE functional use, postural dysfunction, and pain.   ACTIVITY LIMITATIONS: carrying, lifting, dressing, self feeding, and reach over head  PARTICIPATION LIMITATIONS: meal prep, cleaning, laundry, shopping, and community activity  PERSONAL FACTORS: 1 comorbidity: Rt total shoulder replacement   are also affecting patient's functional outcome.   REHAB POTENTIAL: Good  CLINICAL  DECISION MAKING: Stable/uncomplicated  EVALUATION COMPLEXITY: Low  GOALS: Goals reviewed with patient? Yes  SHORT TERM GOALS: Target date: 07/01/2023    Be independent in initial HEP Baseline: Goal status: MET  2.  Report > or = to 30% use of Rt UE with light home and self-care tasks due to improve ROM and strength  Baseline:  Goal status: INITIAL  3.  Demonstrate Rt shoulder P/ROM flexion to > or = to 110 degrees to improve reaching  Baseline: 130 (06/23/23) Goal status: MET  4.  Perform Rt shoulder ER to back of head to improve independence with self-care Baseline: 32 (06/23/23) Goal status: In progress   LONG TERM GOALS: Target date: 07/29/2023    Be independent in advanced HEP Baseline:  Goal status: INITIAL  2.  Improve FOTO > or = to 59 to improve functional use of Rt UE Baseline: 40 Goal status: INITIAL  3. Improve Rt shoulder A/ROM flexion to > or = to 115 degrees to allow for reaching overhead into cabinets Baseline: 145 (07/08/23) Goal status: MET  4.  Report > or = to 70% of normal use of Rt UE for ADLs due to improved functional strength and A/ROM Baseline:  Goal status: INITIAL  5.  Demonstrate Rt shoulder A/ROM ER to T1 to improve self care Baseline:  Goal status: INITIAL  6.  Perform golf swing (chipping motion) without significant pain of substitution in Rt UE Baseline: working on this at home (07/19/23) Goal status: IN PROGRESS  PLAN: PT FREQUENCY: 2x/week  PT DURATION: 8 weeks  PLANNED INTERVENTIONS: 97110-Therapeutic exercises, 97530- Therapeutic activity, [id number]- Neuromuscular re-education, 97535- Self Care, [id number]- Manual therapy, [id number]- Canalith repositioning, [id number]- Aquatic Therapy, 97014- Electrical stimulation (unattended), 97033- Ionotophoresis 4mg /ml Dexamethasone, Patient/Family education, Taping, Dry Needling, Joint mobilization, Spinal mobilization, Vestibular training, Cryotherapy, and Moist heat  PLAN FOR NEXT SESSION: Rt UE  strength, flexibility and endurance.  FOTO, check final goals and ROM  Lorrene Reid, PT 07/19/23 12:31 PM  [facility] [address], [address] [place], [place] [address] Phone # [phone] Fax [phone]

## 2023-07-20 ENCOUNTER — Telehealth: Payer: Self-pay

## 2023-07-20 ENCOUNTER — Other Ambulatory Visit: Payer: Self-pay

## 2023-07-20 DIAGNOSIS — M810 Age-related osteoporosis without current pathological fracture: Secondary | ICD-10-CM | POA: Insufficient documentation

## 2023-07-20 NOTE — Telephone Encounter (Signed)
Auth Submission: NO AUTH NEEDED Site of care: Site of care: [facility] Payer: Medicare A/B with [employer] supplement Medication & CPT/J Code(s) submitted: Prolia (Denosumab) [id number] Route of submission (phone, fax, portal):  Phone # Fax # Auth type: Buy/Bill PB Units/visits requested: 60mg  x 2 doses Reference number:  Approval from: 07/20/23 to 07/19/24

## 2023-07-26 ENCOUNTER — Ambulatory Visit: Payer: Medicare Other

## 2023-07-26 DIAGNOSIS — M25611 Stiffness of right shoulder, not elsewhere classified: Secondary | ICD-10-CM | POA: Diagnosis not present

## 2023-07-26 DIAGNOSIS — R293 Abnormal posture: Secondary | ICD-10-CM | POA: Diagnosis not present

## 2023-07-26 DIAGNOSIS — M25511 Pain in right shoulder: Secondary | ICD-10-CM

## 2023-07-26 DIAGNOSIS — M6281 Muscle weakness (generalized): Secondary | ICD-10-CM | POA: Diagnosis not present

## 2023-07-26 DIAGNOSIS — R252 Cramp and spasm: Secondary | ICD-10-CM | POA: Diagnosis not present

## 2023-07-26 DIAGNOSIS — M353 Polymyalgia rheumatica: Secondary | ICD-10-CM | POA: Diagnosis not present

## 2023-07-26 NOTE — Therapy (Signed)
OUTPATIENT PHYSICAL THERAPY TREATMENT   Patient Name: Tracey [name] MRN: [id number] DOB:[birth year], 77 y.o., female Today's Date: 07/26/2023  END OF SESSION:  PT End of Session - 07/26/23 1246     Visit Number 12    Authorization Type Medicare-KX at 15    PT Start Time 1151    PT Stop Time 1233    PT Time Calculation (min) 42 min    Activity Tolerance Patient tolerated treatment well    Behavior During Therapy [facility] for tasks assessed/performed                       Past Medical History:  Diagnosis Date   Allergy    Anxiety    takes Ativan daily prn   Arthritis    Breast cancer (HCC) 2014   ER+/PR+/HEr2-,    Bruises easily    Colon polyps    2 polyps by report   Coronary artery disease involving native coronary artery of native heart without angina pectoris 01/01/2014   NSTEMI in January 2019 in [place]>> s/p DES x2 to the LAD (procedure complicated by stent thrombosis) Myoview 05/2020: EF 72, no infarct or ischemia; low risk Echocardiogram 05/2019: EF 60-65, GLS -21.5 Cardiac catheterization at [facility] in 08/2017: Normal left main, normal LCx, normal RCA; LAD/diagonal 95%>> PCI    Diverticulosis    GERD (gastroesophageal reflux disease)    occasionally takes Nexium    Gout    Headache    MIgraines- no headaches- has floater in eyes   History of bladder infections    many yrs ago   History of bronchitis    last time at least 32yrs ago   History of hiatal hernia    History of stress incontinence    Hyperlipidemia    takes Pravastatin daily   Hypertension    Insomnia    takes Ambien nightly prn   NSTEMI (non-ST elevated myocardial infarction) (HCC) 08/2017   OSA (obstructive sleep apnea)    on CPAP   Osteoarthritis    Peripheral edema    takes Furosemide daily prn   PONV (postoperative nausea and vomiting)    Postmenopausal hormone therapy    Radiation 07/27/13-09/07/13   Right Breast x 31 treatments   Rhinitis    uses  Flonase prn   Sinus congestion    Status post breast reconstruction 10/15/2014   Bilateral implant removal and DIEP performed in [place], [place]   Tachycardia    takes Metoprolol daily   Ventricular tachycardia, non-sustained (HCC)    during sleep study 2009 with normal cardiac workup   Past Surgical History:  Procedure Laterality Date   ABDOMINAL HYSTERECTOMY     with BSO   APPENDECTOMY     ARTERY BIOPSY Left 05/15/2022   Procedure: BIOPSY TEMPORAL ARTERY;  Surgeon: Cephus Shelling, MD;  Location: [facility] OR;  Service: Vascular;  Laterality: Left;   AXILLARY SENTINEL NODE BIOPSY Right 05/23/2013   Procedure: AXILLARY SENTINEL NODE BIOPSY;  Surgeon: Adolph Pollack, MD;  Location: [facility] OR;  Service: General;  Laterality: Right;  nuc med injection 7:00   BREAST RECONSTRUCTION WITH PLACEMENT OF TISSUE EXPANDER AND FLEX HD (ACELLULAR HYDRATED DERMIS) Bilateral 05/23/2013   Procedure: BILATERAL BREAST RECONSTRUCTION WITH PLACEMENT OF TISSUE EXPANDER AND FLEX HD;  Surgeon: Etter Sjogren, MD;  Location: [facility] OR;  Service: Plastics;  Laterality: Bilateral;   CARDIAC CATHETERIZATION  1999   CATARACT EXTRACTION, BILATERAL Bilateral    CHOLECYSTECTOMY  COSMETIC SURGERY     OTHER SURGICAL HISTORY  09/2017   had cyst removal from spine    PERCUTANEOUS CORONARY STENT INTERVENTION (PCI-S)  08/2017   done in [place]     REMOVAL OF BILATERAL TISSUE EXPANDERS WITH PLACEMENT OF BILATERAL BREAST IMPLANTS Bilateral 05/17/2014   Procedure: REMOVAL OF BILATERAL TISSUE EXPANDERS WITH PLACEMENT OF BILATERAL BREAST IMPLANTS FOR RECONSTRUCTION;  Surgeon: Etter Sjogren, MD;  Location: [facility] OR;  Service: Plastics;  Laterality: Bilateral;   REVERSE SHOULDER ARTHROPLASTY Right 05/13/2023   Procedure: REVERSE SHOULDER ARTHROPLASTY;  Surgeon: Francena Hanly, MD;  Location: [facility] ORS;  Service: Orthopedics;  Laterality: Right;   TONSILLECTOMY     TOTAL KNEE ARTHROPLASTY Right 07/26/2018   Procedure: TOTAL KNEE ARTHROPLASTY;   Surgeon: Durene Romans, MD;  Location: [facility] ORS;  Service: Orthopedics;  Laterality: Right;  70 mins   TOTAL KNEE ARTHROPLASTY Left 12/02/2021   Procedure: TOTAL KNEE ARTHROPLASTY;  Surgeon: Durene Romans, MD;  Location: [facility] ORS;  Service: Orthopedics;  Laterality: Left;   TOTAL MASTECTOMY Bilateral 05/23/2013   Procedure: TOTAL MASTECTOMY;  Surgeon: Adolph Pollack, MD;  Location: [facility] OR;  Service: General;  Laterality: Bilateral;   Patient Active Problem List   Diagnosis Date Noted   Age-related osteoporosis without current pathological fracture 07/20/2023   CAP (community acquired pneumonia) 05/18/2023   Hypokalemia 05/18/2023   Anemia 05/18/2023   Acute hypoxemic respiratory failure (HCC) 05/17/2023   S/P reverse total shoulder arthroplasty, right 05/13/2023   Fever of unknown origin 06/10/2022   Stage 3b chronic kidney disease (HCC) 06/10/2022   ARF (acute renal failure) (HCC) 12/30/2021   DOE (dyspnea on exertion) 12/29/2021   S/P total knee arthroplasty, left 12/02/2021   Preoperative cardiovascular examination 11/18/2021   REM behavioral disorder 06/12/2021   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   CPAP (continuous positive airway pressure) dependence 01/20/2021   Unsteady gait when walking 01/20/2021   Complaint related to dreams 06/05/2019   Metabolic acidosis with increased anion gap and accumulation of organic acids 08/17/2018   Nausea, vomiting, and diarrhea 08/17/2018   AKI (acute kidney injury) (HCC) 08/17/2018   Overweight (BMI 25.0-29.9) 07/27/2018   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/27/2018   S/P right TKA 07/26/2018   S/P knee replacement 07/26/2018   UARS (upper airway resistance syndrome) 05/30/2018   Hyperlipidemia LDL goal <70 05/02/2018   Leg injury, right, initial encounter 01/06/2017   Contusion of right lower leg 01/06/2017   Hypoxemia 12/23/2015   Statin myopathy 05/20/2015   MCI (mild cognitive impairment)  05/20/2015   Edema 05/17/2015   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   Breast cancer genetic susceptibility 04/02/2015   OSA on CPAP 04/02/2015   Coronary artery disease involving native coronary artery with angina pectoris (HCC) 01/01/2014   Chest pain 01/01/2014   Essential hypertension    Ventricular tachycardia, non-sustained (HCC)    Breast cancer of upper-outer quadrant of right female breast (HCC) 06/28/2013   Postoperative visit 06/02/2013   Gout    Tachycardia     PCP: Alysia Penna, MD   REFERRING PROVIDER: Francena Hanly, MD  REFERRING DIAG: [phone] presence of Rt artificial joint  THERAPY DIAG:  Abnormal posture  Cramp and spasm  Muscle weakness (generalized)  Acute pain of right shoulder  Stiffness of right shoulder, not elsewhere classified  Rationale for Evaluation and Treatment: Rehabilitation  ONSET DATE: Reverse total shoulder replacement 05/13/23  SUBJECTIVE:  SUBJECTIVE STATEMENT: I am leaving for [place] this week.  I will resume PT when I get there.   Hand dominance: Right  PERTINENT HISTORY: Rt reverse total shoulder replacement 04/2023, Breast cancer 2014   PAIN: 07/26/23 Are you having pain? Yes: NPRS scale:3/10 neck and shoulder   Pain location: Rt neck, Rt shoulder  Pain description: shoulder aching  Aggravating factors: trying to use Rt shoulder Relieving factors: pain medication, ice  PRECAUTIONS: Other: No arm bike due to lymph node removal in 2014  Follow protocol- scanned in media Current 06/15/23: current P/ROM limitations: ER to 45, flexion to 115, abduction 90  RED FLAGS: None   WEIGHT BEARING RESTRICTIONS: No  FALLS:  Has patient fallen in last 6 months? No  LIVING ENVIRONMENT: Lives with: lives with their spouse Lives in:  House/apartment  OCCUPATION: Retired   PLOF: Independent and Leisure: golf  PATIENT GOALS: Rt shoulder strength, ROM, return to functional use  NEXT MD VISIT: 6 months?   OBJECTIVE:  Note: Objective measures were completed at Evaluation unless otherwise noted.  DIAGNOSTIC FINDINGS:  None   PATIENT SURVEYS :  06/03/23: FOTO 40 (goal is 59) 07/26/23: FOTO: 55  COGNITION: Overall cognitive status: Within functional limits for tasks assessed     SENSATION: WFL  POSTURE: Forward head   UPPER EXTREMITY ROM:   Passive ROM Right eval Right  06/23/23 Left eval A/PROM 07/08/23 Left A/ROM 07/26/23  Shoulder flexion 85 with pain 130  Full throughout  145/150   Shoulder extension       Shoulder abduction 60- pain, protocol limit 85  101/103   Shoulder adduction       Shoulder internal rotation    Hand to hip L4 at midline   Shoulder external rotation 20 degrees-Pain 32   17/30 To C2  Elbow flexion full      Elbow extension Full       Wrist flexion       Wrist extension       Wrist ulnar deviation       Wrist radial deviation       Wrist pronation       Wrist supination       (Blank rows = not tested)  UPPER EXTREMITY MMT: Not tested due to post-op   JOINT MOBILITY TESTING:  NA  PALPATION:  Tension and palpable tenderness over Rt neck, upper trap and globally about the Rt shoulder.  Well healing surgical incision with reduced mobility.    TODAY'S TREATMENT:    DATE: 07/26/23 IR with pulley: x 3 minutes- tactile cues required Verbal review of all HEP and how to progress after she leaves for her trip.  She plans to begin PT while there.  Can add weights to progress Trigger Point Dry-Needling  Treatment instructions: Expect mild to moderate muscle soreness. S/S of pneumothorax if dry needled over a lung field, and to seek immediate medical attention should they occur. Patient verbalized understanding of these instructions and education.  Patient Consent Given:  Yes Education handout provided: Previously provided Muscles treated: Rt upper trap, levator, Rt thoracic multifidi, posterior deltoid  Treatment response/outcome: Utilized skilled palpation to identify trigger points.  During dry needling able to palpate muscle twitch and muscle elongation  Elongation and release to Rt neck and posterior shoulder Skilled palpation and monitoring by PT during dry needling  DATE: 07/19/23 IR with pulley: x 3 minutes- tactile cues required Finger ladder: added 1# x7 PROM supine: all 4 planes of motion Added  side lying active flexion 2 sets of 10 Seated flexion and scaption 1# added 2x5  DATE: 07/08/23 UBE slowly x 3 min  10th visit re-assessment PROM supine: all 4 planes of motion Added side lying active flexion 2 sets of 10 Supine active flexion x 10   PATIENT EDUCATION: Education details: Access Code: [email or web address], protocol limits and protection phase of rehab Person educated: Patient Education method: Explanation, Demonstration, and Handouts Education comprehension: verbalized understanding and returned demonstration  HOME EXERCISE PROGRAM: Access Code: [email or web address] URL: [email or web address] Date: 06/28/2023 Prepared by: Tresa Endo  Exercises - Seated Gripping Towel  - 3 x daily - 7 x weekly - 1 sets - 10 reps - 5shoulder ab hold - Isometric Shoulder Abduction at Wall  - 3 x daily - 7 x weekly - 1 sets - 10 reps - 5 hold - Seated Elbow Flexion and Extension AROM  - 3 x daily - 7 x weekly - 1-2 sets - 10 reps - Supine Shoulder Flexion PROM with Caregiver  - 1 x daily - 7 x weekly - 3 sets - 10 reps - Supine Shoulder Flexion AAROM with Hands Clasped  - 3 x daily - 7 x weekly - 1 sets - 10 reps - 5 hold - Seated Gentle Upper Trapezius Stretch  - 3 x daily - 7 x weekly - 1 sets - 3 reps - 20 hold - Seated Cervical Rotation AROM  - 3 x daily - 7 x weekly - 1 sets - 3 reps - 20 hold - Seated Shoulder Flexion AAROM with Pulley Behind  - 3 x daily -  7 x weekly - 1 sets - 10 reps - Seated Shoulder Scaption AAROM with Pulley at Side  - 1 x daily - 7 x weekly - 3 sets - 10 reps - Shoulder Flexion Wall Slide with Towel  - 1-2 x daily - 7 x weekly - 1 sets - 5 reps - 5-10 hold - Scaption Wall Slide with Towel  - 1-2 x daily - 7 x weekly - 1 sets - 5 reps - 5-10 hold - Supine Shoulder External Rotation with Dowel  - 1-2 x daily - 7 x weekly - 1 sets - 5-10 reps - 5 hold - Sidelying Shoulder External Rotation  - 1-2 x daily - 7 x weekly - 1-2 sets - 5-10 reps - Sidelying Shoulder Flexion 15 Degrees  - 1 x daily - 7 x weekly - 1-2 sets - 5-10 reps - Sidelying Shoulder Abduction  - 1 x daily - 7 x weekly - 1-2 sets - 5-10 reps  ASSESSMENT:  CLINICAL IMPRESSION: Pt will D/C today as she is moving to [place] for several months.  She will pursue PT while there.  She has made steady progress with improved functional A/ROM and FOTO is improved.  Pt with tension in Rt upper trap and posterior shoulder and this is improved overall.  Good response to DN with improved tissue mobility after DN and manual therapy.  Review of all HEP with pt and discussed how to progress.  She will continue to work on this.  Pt with reduced endurance for eating and doing her hair and continues to work on this.  She will D/C today.   OBJECTIVE IMPAIRMENTS: decreased activity tolerance, decreased ROM, decreased strength, increased muscle spasms, impaired flexibility, impaired UE functional use, postural dysfunction, and pain.   ACTIVITY LIMITATIONS: carrying, lifting, dressing, self feeding, and reach over head  PARTICIPATION LIMITATIONS: meal prep, cleaning, laundry, shopping, and community  activity  PERSONAL FACTORS: 1 comorbidity: Rt total shoulder replacement   are also affecting patient's functional outcome.   REHAB POTENTIAL: Good  CLINICAL DECISION MAKING: Stable/uncomplicated  EVALUATION COMPLEXITY: Low  GOALS: Goals reviewed with patient? Yes  SHORT TERM  GOALS: Target date: 07/01/2023    Be independent in initial HEP Baseline: Goal status: MET  2.  Report > or = to 30% use of Rt UE with light home and self-care tasks due to improve ROM and strength  Baseline:  Goal status: INITIAL  3.  Demonstrate Rt shoulder P/ROM flexion to > or = to 110 degrees to improve reaching  Baseline: 130 (06/23/23) Goal status: MET  4.  Perform Rt shoulder ER to back of head to improve independence with self-care Baseline: 32 (06/23/23) Goal status: In progress   LONG TERM GOALS: Target date: 07/29/2023    Be independent in advanced HEP Baseline:  Goal status: Met for current HEP  2.  Improve FOTO > or = to 59 to improve functional use of Rt UE Baseline: 55 (07/26/23) Goal status: partially met   3. Improve Rt shoulder A/ROM flexion to > or = to 115 degrees to allow for reaching overhead into cabinets Baseline: 145 (07/08/23) Goal status: MET  4.  Report > or = to 70% of normal use of Rt UE for ADLs due to improved functional strength and A/ROM Baseline:  >50%- limited in using for lifting, reaching behind head and back and eating (07/26/23) Goal status: Partially met   5.  Demonstrate Rt shoulder A/ROM ER to T1 to improve self care Baseline: C2 (07/26/23) Goal status: partially met  6.  Perform golf swing (chipping motion) without significant pain of substitution in Rt UE Baseline: working on this at home (07/19/23) Goal status: IN PROGRESS  PLAN: PHYSICAL THERAPY DISCHARGE SUMMARY  Visits from Start of Care: 12  Current functional level related to goals / functional outcomes: Pt is leaving to go to [place] for several months.  PT gave pt a copy of her original PT order so she can take this with her to resume PT there.  See above for current goals status.     Remaining deficits: Limited Rt shoulder A/Rom and functional strength consistent with 2 months post-op   Education / Equipment: HEP, how to progress exercises    Patient agrees  to discharge. Patient goals were partially met. Patient is being discharged due to the patient's request.   Lorrene Reid, PT 07/26/23 12:47 PM  [facility] [address], [address] [place], [place] [address] Phone # [phone] Fax [phone]

## 2023-07-27 ENCOUNTER — Ambulatory Visit: Payer: Medicare Other

## 2023-07-27 ENCOUNTER — Other Ambulatory Visit: Payer: Self-pay

## 2023-07-27 VITALS — BP 123/73 | HR 70 | Temp 98.5°F | Resp 16 | Ht 62.0 in | Wt 149.0 lb

## 2023-07-27 DIAGNOSIS — M810 Age-related osteoporosis without current pathological fracture: Secondary | ICD-10-CM

## 2023-07-27 MED ORDER — DENOSUMAB 60 MG/ML ~~LOC~~ SOSY
60.0000 mg | PREFILLED_SYRINGE | Freq: Once | SUBCUTANEOUS | Status: AC
  Administered 2023-07-27: 60 mg via SUBCUTANEOUS

## 2023-07-27 NOTE — Progress Notes (Signed)
Diagnosis: Osteoporosis  Provider:  Chilton Greathouse MD  Procedure: Injection  Prolia (Denosumab), Dose: 60 mg, Site: subcutaneous, Number of injections: 1  Injection Site(s): Left lower quad. abdomen  Post Care: Observation period completed  Discharge: Condition: Good, Destination: Home . AVS Provided  Performed by:  Loney Hering, LPN

## 2023-07-30 DIAGNOSIS — Z23 Encounter for immunization: Secondary | ICD-10-CM | POA: Diagnosis not present

## 2023-07-30 DIAGNOSIS — Z79890 Hormone replacement therapy (postmenopausal): Secondary | ICD-10-CM | POA: Diagnosis not present

## 2023-07-30 DIAGNOSIS — R6882 Decreased libido: Secondary | ICD-10-CM | POA: Diagnosis not present

## 2023-08-30 DIAGNOSIS — R42 Dizziness and giddiness: Secondary | ICD-10-CM | POA: Diagnosis not present

## 2023-08-30 DIAGNOSIS — E785 Hyperlipidemia, unspecified: Secondary | ICD-10-CM | POA: Diagnosis not present

## 2023-08-30 DIAGNOSIS — R5383 Other fatigue: Secondary | ICD-10-CM | POA: Diagnosis not present

## 2023-08-30 DIAGNOSIS — I700 Atherosclerosis of aorta: Secondary | ICD-10-CM | POA: Diagnosis not present

## 2023-08-30 DIAGNOSIS — I517 Cardiomegaly: Secondary | ICD-10-CM | POA: Diagnosis not present

## 2023-08-30 DIAGNOSIS — R079 Chest pain, unspecified: Secondary | ICD-10-CM | POA: Diagnosis not present

## 2023-08-30 DIAGNOSIS — M353 Polymyalgia rheumatica: Secondary | ICD-10-CM | POA: Diagnosis not present

## 2023-08-30 DIAGNOSIS — R0602 Shortness of breath: Secondary | ICD-10-CM | POA: Diagnosis not present

## 2023-08-30 DIAGNOSIS — K30 Functional dyspepsia: Secondary | ICD-10-CM | POA: Diagnosis not present

## 2023-09-01 DIAGNOSIS — K30 Functional dyspepsia: Secondary | ICD-10-CM | POA: Diagnosis not present

## 2023-09-01 DIAGNOSIS — R42 Dizziness and giddiness: Secondary | ICD-10-CM | POA: Diagnosis not present

## 2023-09-01 DIAGNOSIS — R5383 Other fatigue: Secondary | ICD-10-CM | POA: Diagnosis not present

## 2023-09-01 DIAGNOSIS — R079 Chest pain, unspecified: Secondary | ICD-10-CM | POA: Diagnosis not present

## 2023-09-01 DIAGNOSIS — R0602 Shortness of breath: Secondary | ICD-10-CM | POA: Diagnosis not present

## 2023-09-01 DIAGNOSIS — E785 Hyperlipidemia, unspecified: Secondary | ICD-10-CM | POA: Diagnosis not present

## 2023-09-02 DIAGNOSIS — R079 Chest pain, unspecified: Secondary | ICD-10-CM | POA: Diagnosis not present

## 2023-09-02 DIAGNOSIS — R911 Solitary pulmonary nodule: Secondary | ICD-10-CM | POA: Diagnosis not present

## 2023-09-02 DIAGNOSIS — R5383 Other fatigue: Secondary | ICD-10-CM | POA: Diagnosis not present

## 2023-09-02 DIAGNOSIS — E785 Hyperlipidemia, unspecified: Secondary | ICD-10-CM | POA: Diagnosis not present

## 2023-09-02 DIAGNOSIS — K30 Functional dyspepsia: Secondary | ICD-10-CM | POA: Diagnosis not present

## 2023-09-02 DIAGNOSIS — R42 Dizziness and giddiness: Secondary | ICD-10-CM | POA: Diagnosis not present

## 2023-09-02 DIAGNOSIS — I2510 Atherosclerotic heart disease of native coronary artery without angina pectoris: Secondary | ICD-10-CM | POA: Diagnosis not present

## 2023-09-02 DIAGNOSIS — R0602 Shortness of breath: Secondary | ICD-10-CM | POA: Diagnosis not present

## 2023-09-07 DIAGNOSIS — R0602 Shortness of breath: Secondary | ICD-10-CM | POA: Diagnosis not present

## 2023-09-07 DIAGNOSIS — R918 Other nonspecific abnormal finding of lung field: Secondary | ICD-10-CM | POA: Diagnosis not present

## 2023-09-07 DIAGNOSIS — R079 Chest pain, unspecified: Secondary | ICD-10-CM | POA: Diagnosis not present

## 2023-09-07 DIAGNOSIS — R002 Palpitations: Secondary | ICD-10-CM | POA: Diagnosis not present

## 2023-09-07 DIAGNOSIS — K449 Diaphragmatic hernia without obstruction or gangrene: Secondary | ICD-10-CM | POA: Diagnosis not present

## 2023-09-10 DIAGNOSIS — S4991XA Unspecified injury of right shoulder and upper arm, initial encounter: Secondary | ICD-10-CM | POA: Diagnosis not present

## 2023-09-10 DIAGNOSIS — Z96611 Presence of right artificial shoulder joint: Secondary | ICD-10-CM | POA: Diagnosis not present

## 2023-09-10 DIAGNOSIS — M25511 Pain in right shoulder: Secondary | ICD-10-CM | POA: Diagnosis not present

## 2023-09-15 DIAGNOSIS — Z96611 Presence of right artificial shoulder joint: Secondary | ICD-10-CM | POA: Diagnosis not present

## 2023-09-15 DIAGNOSIS — Z471 Aftercare following joint replacement surgery: Secondary | ICD-10-CM | POA: Diagnosis not present

## 2023-09-16 ENCOUNTER — Telehealth: Payer: Self-pay | Admitting: Neurology

## 2023-09-16 NOTE — Telephone Encounter (Signed)
LVM and sent mychart msg informing pt of need to reschedule 11/18/23 appt - MD out

## 2023-09-27 DIAGNOSIS — W01198A Fall on same level from slipping, tripping and stumbling with subsequent striking against other object, initial encounter: Secondary | ICD-10-CM | POA: Diagnosis not present

## 2023-09-27 DIAGNOSIS — Z7902 Long term (current) use of antithrombotics/antiplatelets: Secondary | ICD-10-CM | POA: Diagnosis not present

## 2023-09-27 DIAGNOSIS — I4891 Unspecified atrial fibrillation: Secondary | ICD-10-CM | POA: Diagnosis not present

## 2023-09-27 DIAGNOSIS — R402412 Glasgow coma scale score 13-15, at arrival to emergency department: Secondary | ICD-10-CM | POA: Diagnosis not present

## 2023-09-27 DIAGNOSIS — S0990XA Unspecified injury of head, initial encounter: Secondary | ICD-10-CM | POA: Diagnosis not present

## 2023-09-27 DIAGNOSIS — M25511 Pain in right shoulder: Secondary | ICD-10-CM | POA: Diagnosis not present

## 2023-09-27 DIAGNOSIS — S060X0A Concussion without loss of consciousness, initial encounter: Secondary | ICD-10-CM | POA: Diagnosis not present

## 2023-09-27 DIAGNOSIS — R079 Chest pain, unspecified: Secondary | ICD-10-CM | POA: Diagnosis not present

## 2023-09-27 DIAGNOSIS — Z471 Aftercare following joint replacement surgery: Secondary | ICD-10-CM | POA: Diagnosis not present

## 2023-09-27 DIAGNOSIS — Z96611 Presence of right artificial shoulder joint: Secondary | ICD-10-CM | POA: Diagnosis not present

## 2023-09-27 DIAGNOSIS — S2231XA Fracture of one rib, right side, initial encounter for closed fracture: Secondary | ICD-10-CM | POA: Diagnosis not present

## 2023-09-29 ENCOUNTER — Other Ambulatory Visit: Payer: Self-pay

## 2023-09-29 DIAGNOSIS — K449 Diaphragmatic hernia without obstruction or gangrene: Secondary | ICD-10-CM | POA: Diagnosis not present

## 2023-09-29 DIAGNOSIS — K2100 Gastro-esophageal reflux disease with esophagitis, without bleeding: Secondary | ICD-10-CM | POA: Diagnosis not present

## 2023-09-29 DIAGNOSIS — R0600 Dyspnea, unspecified: Secondary | ICD-10-CM | POA: Diagnosis not present

## 2023-09-29 DIAGNOSIS — I25118 Atherosclerotic heart disease of native coronary artery with other forms of angina pectoris: Secondary | ICD-10-CM

## 2023-09-29 MED ORDER — NITROGLYCERIN 0.4 MG SL SUBL: 0.4000 mg | SUBLINGUAL_TABLET | SUBLINGUAL | 7 refills | Status: AC | PRN

## 2023-09-30 DIAGNOSIS — Z96611 Presence of right artificial shoulder joint: Secondary | ICD-10-CM | POA: Diagnosis not present

## 2023-09-30 DIAGNOSIS — Z471 Aftercare following joint replacement surgery: Secondary | ICD-10-CM | POA: Diagnosis not present

## 2023-10-05 DIAGNOSIS — M353 Polymyalgia rheumatica: Secondary | ICD-10-CM | POA: Diagnosis not present

## 2023-10-06 DIAGNOSIS — Z96611 Presence of right artificial shoulder joint: Secondary | ICD-10-CM | POA: Diagnosis not present

## 2023-10-06 DIAGNOSIS — Z471 Aftercare following joint replacement surgery: Secondary | ICD-10-CM | POA: Diagnosis not present

## 2023-10-11 DIAGNOSIS — Z471 Aftercare following joint replacement surgery: Secondary | ICD-10-CM | POA: Diagnosis not present

## 2023-10-11 DIAGNOSIS — Z96611 Presence of right artificial shoulder joint: Secondary | ICD-10-CM | POA: Diagnosis not present

## 2023-10-14 DIAGNOSIS — Z471 Aftercare following joint replacement surgery: Secondary | ICD-10-CM | POA: Diagnosis not present

## 2023-10-14 DIAGNOSIS — Z96611 Presence of right artificial shoulder joint: Secondary | ICD-10-CM | POA: Diagnosis not present

## 2023-10-18 DIAGNOSIS — Z96611 Presence of right artificial shoulder joint: Secondary | ICD-10-CM | POA: Diagnosis not present

## 2023-10-18 DIAGNOSIS — Z471 Aftercare following joint replacement surgery: Secondary | ICD-10-CM | POA: Diagnosis not present

## 2023-10-18 DIAGNOSIS — M6281 Muscle weakness (generalized): Secondary | ICD-10-CM | POA: Diagnosis not present

## 2023-10-21 DIAGNOSIS — Z471 Aftercare following joint replacement surgery: Secondary | ICD-10-CM | POA: Diagnosis not present

## 2023-10-21 DIAGNOSIS — M6281 Muscle weakness (generalized): Secondary | ICD-10-CM | POA: Diagnosis not present

## 2023-10-21 DIAGNOSIS — Z96611 Presence of right artificial shoulder joint: Secondary | ICD-10-CM | POA: Diagnosis not present

## 2023-10-27 DIAGNOSIS — Z87891 Personal history of nicotine dependence: Secondary | ICD-10-CM | POA: Diagnosis not present

## 2023-10-27 DIAGNOSIS — K5900 Constipation, unspecified: Secondary | ICD-10-CM | POA: Diagnosis not present

## 2023-10-27 DIAGNOSIS — N390 Urinary tract infection, site not specified: Secondary | ICD-10-CM | POA: Diagnosis not present

## 2023-10-27 DIAGNOSIS — R319 Hematuria, unspecified: Secondary | ICD-10-CM | POA: Diagnosis not present

## 2023-10-27 DIAGNOSIS — K5730 Diverticulosis of large intestine without perforation or abscess without bleeding: Secondary | ICD-10-CM | POA: Diagnosis not present

## 2023-10-27 DIAGNOSIS — K449 Diaphragmatic hernia without obstruction or gangrene: Secondary | ICD-10-CM | POA: Diagnosis not present

## 2023-10-29 DIAGNOSIS — N12 Tubulo-interstitial nephritis, not specified as acute or chronic: Secondary | ICD-10-CM | POA: Diagnosis not present

## 2023-10-29 DIAGNOSIS — H109 Unspecified conjunctivitis: Secondary | ICD-10-CM | POA: Diagnosis not present

## 2023-11-02 DIAGNOSIS — N3090 Cystitis, unspecified without hematuria: Secondary | ICD-10-CM | POA: Diagnosis not present

## 2023-11-02 DIAGNOSIS — M353 Polymyalgia rheumatica: Secondary | ICD-10-CM | POA: Diagnosis not present

## 2023-11-02 DIAGNOSIS — K5909 Other constipation: Secondary | ICD-10-CM | POA: Diagnosis not present

## 2023-11-02 DIAGNOSIS — R310 Gross hematuria: Secondary | ICD-10-CM | POA: Diagnosis not present

## 2023-11-02 DIAGNOSIS — K449 Diaphragmatic hernia without obstruction or gangrene: Secondary | ICD-10-CM | POA: Diagnosis not present

## 2023-11-02 DIAGNOSIS — M549 Dorsalgia, unspecified: Secondary | ICD-10-CM | POA: Diagnosis not present

## 2023-11-02 DIAGNOSIS — I10 Essential (primary) hypertension: Secondary | ICD-10-CM | POA: Diagnosis not present

## 2023-11-18 ENCOUNTER — Ambulatory Visit: Payer: Medicare Other | Admitting: Neurology

## 2023-11-26 DIAGNOSIS — R0781 Pleurodynia: Secondary | ICD-10-CM | POA: Diagnosis not present

## 2023-11-26 DIAGNOSIS — N1832 Chronic kidney disease, stage 3b: Secondary | ICD-10-CM | POA: Diagnosis not present

## 2023-11-26 DIAGNOSIS — N3021 Other chronic cystitis with hematuria: Secondary | ICD-10-CM | POA: Diagnosis not present

## 2023-11-26 DIAGNOSIS — C50919 Malignant neoplasm of unspecified site of unspecified female breast: Secondary | ICD-10-CM | POA: Diagnosis not present

## 2023-11-26 DIAGNOSIS — K5900 Constipation, unspecified: Secondary | ICD-10-CM | POA: Diagnosis not present

## 2023-11-26 DIAGNOSIS — K219 Gastro-esophageal reflux disease without esophagitis: Secondary | ICD-10-CM | POA: Diagnosis not present

## 2023-11-26 DIAGNOSIS — R350 Frequency of micturition: Secondary | ICD-10-CM | POA: Diagnosis not present

## 2023-11-26 DIAGNOSIS — M25512 Pain in left shoulder: Secondary | ICD-10-CM | POA: Diagnosis not present

## 2023-11-26 DIAGNOSIS — K222 Esophageal obstruction: Secondary | ICD-10-CM | POA: Diagnosis not present

## 2023-11-26 DIAGNOSIS — R3915 Urgency of urination: Secondary | ICD-10-CM | POA: Diagnosis not present

## 2023-11-26 DIAGNOSIS — I2510 Atherosclerotic heart disease of native coronary artery without angina pectoris: Secondary | ICD-10-CM | POA: Diagnosis not present

## 2023-11-26 DIAGNOSIS — M353 Polymyalgia rheumatica: Secondary | ICD-10-CM | POA: Diagnosis not present

## 2023-11-26 DIAGNOSIS — D84821 Immunodeficiency due to drugs: Secondary | ICD-10-CM | POA: Diagnosis not present

## 2023-11-26 DIAGNOSIS — I129 Hypertensive chronic kidney disease with stage 1 through stage 4 chronic kidney disease, or unspecified chronic kidney disease: Secondary | ICD-10-CM | POA: Diagnosis not present

## 2023-11-26 DIAGNOSIS — R1319 Other dysphagia: Secondary | ICD-10-CM | POA: Diagnosis not present

## 2023-11-29 ENCOUNTER — Other Ambulatory Visit: Payer: Self-pay | Admitting: Orthopedic Surgery

## 2023-11-29 DIAGNOSIS — R0781 Pleurodynia: Secondary | ICD-10-CM | POA: Diagnosis not present

## 2023-11-29 DIAGNOSIS — Z96611 Presence of right artificial shoulder joint: Secondary | ICD-10-CM | POA: Diagnosis not present

## 2023-12-08 ENCOUNTER — Other Ambulatory Visit: Payer: Self-pay | Admitting: Cardiovascular Disease

## 2023-12-08 DIAGNOSIS — K219 Gastro-esophageal reflux disease without esophagitis: Secondary | ICD-10-CM

## 2023-12-08 DIAGNOSIS — E785 Hyperlipidemia, unspecified: Secondary | ICD-10-CM

## 2023-12-12 ENCOUNTER — Emergency Department (HOSPITAL_COMMUNITY)

## 2023-12-12 ENCOUNTER — Emergency Department (HOSPITAL_COMMUNITY)
Admission: EM | Admit: 2023-12-12 | Discharge: 2023-12-12 | Disposition: A | Discharge status: Home / Self Care | Attending: Emergency Medicine | Admitting: Emergency Medicine

## 2023-12-12 ENCOUNTER — Other Ambulatory Visit: Payer: Self-pay

## 2023-12-12 ENCOUNTER — Encounter (HOSPITAL_COMMUNITY): Payer: Self-pay

## 2023-12-12 DIAGNOSIS — Z955 Presence of coronary angioplasty implant and graft: Secondary | ICD-10-CM | POA: Insufficient documentation

## 2023-12-12 DIAGNOSIS — Z7902 Long term (current) use of antithrombotics/antiplatelets: Secondary | ICD-10-CM | POA: Insufficient documentation

## 2023-12-12 DIAGNOSIS — R519 Headache, unspecified: Secondary | ICD-10-CM | POA: Diagnosis not present

## 2023-12-12 DIAGNOSIS — G8911 Acute pain due to trauma: Secondary | ICD-10-CM | POA: Insufficient documentation

## 2023-12-12 DIAGNOSIS — S0990XA Unspecified injury of head, initial encounter: Secondary | ICD-10-CM

## 2023-12-12 DIAGNOSIS — N1832 Chronic kidney disease, stage 3b: Secondary | ICD-10-CM | POA: Diagnosis not present

## 2023-12-12 DIAGNOSIS — Z87891 Personal history of nicotine dependence: Secondary | ICD-10-CM | POA: Diagnosis not present

## 2023-12-12 DIAGNOSIS — I129 Hypertensive chronic kidney disease with stage 1 through stage 4 chronic kidney disease, or unspecified chronic kidney disease: Secondary | ICD-10-CM | POA: Diagnosis not present

## 2023-12-12 DIAGNOSIS — S0003XA Contusion of scalp, initial encounter: Secondary | ICD-10-CM | POA: Insufficient documentation

## 2023-12-12 DIAGNOSIS — I2510 Atherosclerotic heart disease of native coronary artery without angina pectoris: Secondary | ICD-10-CM | POA: Insufficient documentation

## 2023-12-12 DIAGNOSIS — Z853 Personal history of malignant neoplasm of breast: Secondary | ICD-10-CM | POA: Diagnosis not present

## 2023-12-12 DIAGNOSIS — Z96652 Presence of left artificial knee joint: Secondary | ICD-10-CM | POA: Insufficient documentation

## 2023-12-12 DIAGNOSIS — Z7982 Long term (current) use of aspirin: Secondary | ICD-10-CM | POA: Diagnosis not present

## 2023-12-12 DIAGNOSIS — J984 Other disorders of lung: Secondary | ICD-10-CM | POA: Diagnosis not present

## 2023-12-12 DIAGNOSIS — Z79899 Other long term (current) drug therapy: Secondary | ICD-10-CM | POA: Insufficient documentation

## 2023-12-12 DIAGNOSIS — Z043 Encounter for examination and observation following other accident: Secondary | ICD-10-CM | POA: Diagnosis not present

## 2023-12-12 DIAGNOSIS — M25511 Pain in right shoulder: Secondary | ICD-10-CM

## 2023-12-12 DIAGNOSIS — M546 Pain in thoracic spine: Secondary | ICD-10-CM | POA: Diagnosis not present

## 2023-12-12 DIAGNOSIS — W01198A Fall on same level from slipping, tripping and stumbling with subsequent striking against other object, initial encounter: Secondary | ICD-10-CM | POA: Insufficient documentation

## 2023-12-12 DIAGNOSIS — R079 Chest pain, unspecified: Secondary | ICD-10-CM | POA: Diagnosis not present

## 2023-12-12 DIAGNOSIS — M19011 Primary osteoarthritis, right shoulder: Secondary | ICD-10-CM | POA: Diagnosis not present

## 2023-12-12 DIAGNOSIS — M50321 Other cervical disc degeneration at C4-C5 level: Secondary | ICD-10-CM | POA: Diagnosis not present

## 2023-12-12 DIAGNOSIS — K449 Diaphragmatic hernia without obstruction or gangrene: Secondary | ICD-10-CM | POA: Diagnosis not present

## 2023-12-12 DIAGNOSIS — R9082 White matter disease, unspecified: Secondary | ICD-10-CM | POA: Diagnosis not present

## 2023-12-12 DIAGNOSIS — M1909 Primary osteoarthritis, other specified site: Secondary | ICD-10-CM | POA: Diagnosis not present

## 2023-12-12 DIAGNOSIS — Z96611 Presence of right artificial shoulder joint: Secondary | ICD-10-CM | POA: Diagnosis not present

## 2023-12-12 MED ORDER — TRAMADOL HCL 50 MG PO TABS
50.0000 mg | ORAL_TABLET | Freq: Once | ORAL | Status: AC
  Administered 2023-12-12: 50 mg via ORAL
  Filled 2023-12-12: qty 1

## 2023-12-12 MED ORDER — ACETAMINOPHEN 500 MG PO TABS
1000.0000 mg | ORAL_TABLET | Freq: Once | ORAL | Status: AC
  Administered 2023-12-12: 1000 mg via ORAL
  Filled 2023-12-12: qty 2

## 2023-12-12 NOTE — ED Provider Notes (Signed)
 [place] EMERGENCY DEPARTMENT AT [facility] Provider Note  CSN: [id number] Arrival date & time: 12/12/23 1501  Chief Complaint(s) Fall  HPI Tracey [name] is a 78 y.o. female history of coronary artery disease, hypertension, hyperlipidemia, on Plavix  presenting with fall.  Patient got turned around and fell backwards hitting the back of her head.  She also reports some right upper back pain which is painful when she takes of breath as well as some right shoulder pain.  No pain to her lower extremities, neck, left upper extremity.  No pain to the anterior chest or abdomen.  No loss of consciousness or preceding presyncopal symptoms.   Past Medical History Past Medical History:  Diagnosis Date   Allergy    Anxiety    takes Ativan  daily prn   Arthritis    Breast cancer (HCC) 2014   ER+/PR+/HEr2-,    Bruises easily    Colon polyps    2 polyps by report   Coronary artery disease involving native coronary artery of native heart without angina pectoris 01/01/2014   NSTEMI in January 2019 in [place] >> s/p DES x2 to the LAD (procedure complicated by stent thrombosis) Myoview  05/2020: EF 72, no infarct or ischemia; low risk Echocardiogram 05/2019: EF 60-65, GLS -21.5 Cardiac catheterization at [facility] in 08/2017: Normal left main, normal LCx, normal RCA; LAD/diagonal 95%>> PCI    Diverticulosis    GERD (gastroesophageal reflux disease)    occasionally takes Nexium     Gout    Headache    MIgraines- no headaches- has floater in eyes   History of bladder infections    many yrs ago   History of bronchitis    last time at least 32yrs ago   History of hiatal hernia    History of stress incontinence    Hyperlipidemia    takes Pravastatin  daily   Hypertension    Insomnia    takes Ambien nightly prn   NSTEMI (non-ST elevated myocardial infarction) (HCC) 08/2017   OSA (obstructive sleep apnea)    on CPAP   Osteoarthritis    Peripheral edema    takes  Furosemide  daily prn   PONV (postoperative nausea and vomiting)    Postmenopausal hormone therapy    Radiation 07/27/13-09/07/13   Right Breast x 31 treatments   Rhinitis    uses Flonase  prn   Sinus congestion    Status post breast reconstruction 10/15/2014   Bilateral implant removal and DIEP performed in [place], [place]   Tachycardia    takes Metoprolol  daily   Ventricular tachycardia, non-sustained (HCC)    during sleep study 2009 with normal cardiac workup   Patient Active Problem List   Diagnosis Date Noted   Age-related osteoporosis without current pathological fracture 07/20/2023   CAP (community acquired pneumonia) 05/18/2023   Hypokalemia 05/18/2023   Anemia 05/18/2023   Acute hypoxemic respiratory failure (HCC) 05/17/2023   S/P reverse total shoulder arthroplasty, right 05/13/2023   Fever of unknown origin 06/10/2022   Stage 3b chronic kidney disease (HCC) 06/10/2022   ARF (acute renal failure) (HCC) 12/30/2021   DOE (dyspnea on exertion) 12/29/2021   S/P total knee arthroplasty, left 12/02/2021   Preoperative cardiovascular examination 11/18/2021   REM behavioral disorder 06/12/2021   Supplemental oxygen  dependent 01/20/2021   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   CPAP (continuous positive airway pressure) dependence 01/20/2021   Unsteady gait when walking 01/20/2021   Complaint related to dreams 06/05/2019   Metabolic acidosis with increased anion  gap and accumulation of organic acids 08/17/2018   Nausea, vomiting, and diarrhea 08/17/2018   AKI (acute kidney injury) (HCC) 08/17/2018   Overweight (BMI 25.0-29.9) 07/27/2018   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/27/2018   S/P right TKA 07/26/2018   S/P knee replacement 07/26/2018   UARS (upper airway resistance syndrome) 05/30/2018   Hyperlipidemia LDL goal <70 05/02/2018   Leg injury, right, initial encounter 01/06/2017   Contusion of right lower leg 01/06/2017   Hypoxemia 12/23/2015   Statin myopathy 05/20/2015    MCI (mild cognitive impairment) 05/20/2015   Edema 05/17/2015   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 Authorizing Provider  allopurinol  (ZYLOPRIM ) 100 MG tablet TAKE 1 TABLET BY MOUTH ONCE DAILY 06/25/16   Magrinat, Rozella Cornfield, MD  ASPIRIN  LOW DOSE 81 MG chewable tablet Chew 81 mg by mouth daily.  09/19/17   [provider]  benzonatate  (TESSALON ) 200 MG capsule Take 1 capsule (200 mg total) by mouth 3 (three) times daily as needed for cough. 05/20/23   Akula, Vijaya, MD  Budeson-Glycopyrrol-Formoterol  (BREZTRI  AEROSPHERE) 160-9-4.8 MCG/ACT AERO Inhale 2 puffs into the lungs in the morning and at bedtime. Patient taking differently: Inhale 2 puffs into the lungs as needed (wheezing/SOB). 06/19/22   Diamond Formica, MD  Cholecalciferol  (VITAMIN D3) 50 MCG (2000 UT) TABS Take 2,000 Units by mouth daily.    [provider]  clopidogrel  (PLAVIX ) 75 MG tablet TAKE 1 TABLET(75 MG) BY MOUTH DAILY 07/08/23   Arnoldo Lapping, MD  cyanocobalamin  (,VITAMIN B-12,) 1000 MCG/ML injection Inject 1,000 mcg into the muscle every 30 (thirty) days.    [provider]  ezetimibe  (ZETIA ) 10 MG tablet TAKE 1 TABLET(10 MG) BY MOUTH DAILY 07/08/23   Arnoldo Lapping, MD  fluticasone  (FLONASE ) 50 MCG/ACT nasal spray Place 2 sprays into both nostrils daily. 01/18/13   [provider]  folic acid  (FOLVITE ) 1 MG tablet Take 1 mg by mouth daily.    [provider]  furosemide  (LASIX )  40 MG tablet Take 40 mg by mouth daily as needed for fluid.  04/01/13   [provider]  hydroxychloroquine  (PLAQUENIL ) 200 MG tablet Take 300 mg by mouth daily.    [provider]  loratadine  (CLARITIN ) 10 MG tablet Take 10 mg by mouth daily.    [provider]  metoprolol  succinate (TOPROL -XL) 25 MG 24 hr tablet TAKE 1 TABLET(25 MG) BY MOUTH DAILY 12/10/23   Cooper, Michael, MD  Multiple Vitamin (MULTI-VITAMIN) tablet Take 1 tablet by mouth daily.    [provider]  nitroGLYCERIN  (NITROSTAT ) 0.4 MG SL tablet Place 1 tablet (0.4 mg total) under the tongue every 5 (five) minutes as needed for chest pain. 09/29/23   Arnoldo Lapping, MD  pantoprazole  (PROTONIX ) 40 MG tablet Take 1 tablet (40 mg total) by mouth daily. 12/10/23   Cooper, Michael, MD  polyethylene glycol (MIRALAX  / GLYCOLAX ) 17 g packet Take 17 g by mouth daily as needed for mild constipation. 12/03/21   Cleotilde Dago,  Hanley Lew, PA-C  predniSONE  (DELTASONE ) 1 MG tablet Take 4 mg by mouth daily. 05/21/22   [provider]  predniSONE  (DELTASONE ) 5 MG tablet Take 5 mg by mouth daily with breakfast.    [provider]  Propylene Glycol (SYSTANE COMPLETE) 0.6 % SOLN Place 2 drops into both eyes 2 (two) times daily.    [provider]  rosuvastatin  (CRESTOR ) 20 MG tablet Take 1 tablet (20 mg total) by mouth daily. 12/10/23   Arnoldo Lapping, MD  sertraline  (ZOLOFT ) 100 MG tablet Take 50 mg by mouth daily. 12/06/20   [provider]  testosterone  cypionate (DEPOTESTOSTERONE CYPIONATE) 200 MG/ML injection Inject 200 mg into the muscle every 30 (thirty) days. 01/17/19   [provider]  traMADol  (ULTRAM ) 50 MG tablet Take 100 mg by mouth 2 (two) times daily.    [provider]                                                                                                                                    Past Surgical History Past Surgical History:  Procedure Laterality  Date   ABDOMINAL HYSTERECTOMY     with BSO   APPENDECTOMY     ARTERY BIOPSY Left 05/15/2022   Procedure: BIOPSY TEMPORAL ARTERY;  Surgeon: Young Hensen, MD;  Location: [facility] OR;  Service: Vascular;  Laterality: Left;   AXILLARY SENTINEL NODE BIOPSY Right 05/23/2013   Procedure: AXILLARY SENTINEL NODE BIOPSY;  Surgeon: Harlee Lichtenstein, MD;  Location: [facility] OR;  Service: General;  Laterality: Right;  nuc med injection 7:00   BREAST RECONSTRUCTION WITH PLACEMENT OF TISSUE EXPANDER AND FLEX HD (ACELLULAR HYDRATED DERMIS) Bilateral 05/23/2013   Procedure: BILATERAL BREAST RECONSTRUCTION WITH PLACEMENT OF TISSUE EXPANDER AND FLEX HD;  Surgeon: Elidia Grout, MD;  Location: [facility] OR;  Service: Plastics;  Laterality: Bilateral;   CARDIAC CATHETERIZATION  1999   CATARACT EXTRACTION, BILATERAL Bilateral    CHOLECYSTECTOMY     COSMETIC SURGERY     OTHER SURGICAL HISTORY  09/2017   had cyst removal from spine    PERCUTANEOUS CORONARY STENT INTERVENTION (PCI-S)  08/2017   done in [place]      REMOVAL OF BILATERAL TISSUE EXPANDERS WITH PLACEMENT OF BILATERAL BREAST IMPLANTS Bilateral 05/17/2014   Procedure: REMOVAL OF BILATERAL TISSUE EXPANDERS WITH PLACEMENT OF BILATERAL BREAST IMPLANTS FOR RECONSTRUCTION;  Surgeon: Elidia Grout, MD;  Location: [facility] OR;  Service: Plastics;  Laterality: Bilateral;   REVERSE SHOULDER ARTHROPLASTY Right 05/13/2023   Procedure: REVERSE SHOULDER ARTHROPLASTY;  Surgeon: Ellard Gunning, MD;  Location: [facility] ORS;  Service: Orthopedics;  Laterality: Right;   TONSILLECTOMY     TOTAL KNEE ARTHROPLASTY Right 07/26/2018   Procedure: TOTAL KNEE ARTHROPLASTY;  Surgeon: Claiborne Crew, MD;  Location: [facility] ORS;  Service: Orthopedics;  Laterality: Right;  70 mins   TOTAL KNEE ARTHROPLASTY Left 12/02/2021   Procedure: TOTAL KNEE ARTHROPLASTY;  Surgeon: Claiborne Crew, MD;  Location: [facility] ORS;  Service: Orthopedics;  Laterality: Left;   TOTAL MASTECTOMY Bilateral 05/23/2013   Procedure: TOTAL MASTECTOMY;   Surgeon: Harlee Lichtenstein, MD;  Location: [facility] OR;  Service: General;  Laterality: Bilateral;   Family History Family History  Problem Relation Age of Onset   Breast cancer Mother        possible inflammatory breast cancer   Heart attack Father    Heart disease Father    ALS Sister    Breast cancer Maternal Grandmother 73   Heart attack Maternal Grandfather    Heart attack Paternal Grandfather    Breast cancer Maternal Aunt        dx in her 61s   Breast cancer Other        maternal great grandmother; dx in her 33s    Social History Social History   Tobacco Use   Smoking status: Former    Current packs/day: 1.00    Average packs/day: 1 pack/day for 31.5 years (31.5 ttl pk-yrs)    Types: Cigarettes    Start date: 06/09/1992   Smokeless tobacco: Never   Tobacco comments:    quit at age 75  Vaping Use   Vaping status: Never Used  Substance Use Topics   Alcohol  use: Not Currently    Alcohol /week: 1.0 standard drink of alcohol     Types: 1 Standard drinks or equivalent per week    Comment: 1 a day   Drug use: Not Currently    Types: Other-see comments   Allergies Pseudoeph-hydrocodone -gg, Ivp dye [iodinated contrast media], Oxycodone , Sulfa antibiotics, and Tape  Review of Systems Review of Systems  All other systems reviewed and are negative.   Physical Exam Vital Signs  I have reviewed the triage vital signs BP (!) 140/65 (BP Location: Right Arm)   Pulse 75   Temp 98.5 F (36.9 C) (Oral)   Resp 18   Ht 5\' 2"  (1.575 m)   Wt 67.6 kg   LMP 08/17/1977 (Approximate)   SpO2 100%   BMI 27.26 kg/m  Physical Exam Vitals and nursing note reviewed.  Constitutional:      General: She is not in acute distress.    Appearance: She is well-developed.  HENT:     Head: Normocephalic.     Comments: Very small 1 cm hematoma to the posterior head    Mouth/Throat:     Mouth: Mucous membranes are moist.  Eyes:     Pupils: Pupils are equal, round, and reactive to light.   Cardiovascular:     Rate and Rhythm: Normal rate and regular rhythm.     Heart sounds: No murmur heard. Pulmonary:     Effort: Pulmonary effort is normal. No respiratory distress.     Breath sounds: Normal breath sounds.  Abdominal:     General: Abdomen is flat.     Palpations: Abdomen is soft.     Tenderness: There is no abdominal tenderness.  Musculoskeletal:        General: No tenderness.     Right lower leg: No edema.     Left lower leg: No edema.     Comments: No midline C, T, L-spine tenderness.  Left upper extremity and bilateral lower extremities atraumatic with full range of motion throughout.  No focal tenderness to these extremities.  Right upper extremity with tenderness around the right shoulder and painful range of motion without deformity, right elbow, wrist, hand atraumatic, distal pulses intact.  No anterior chest pain, mild right posterior  chest wall tenderness with no crepitus.  Skin:    General: Skin is warm and dry.  Neurological:     General: No focal deficit present.     Mental Status: She is alert. Mental status is at baseline.  Psychiatric:        Mood and Affect: Mood normal.        Behavior: Behavior normal.     ED Results and Treatments Labs (all labs ordered are listed, but only abnormal results are displayed) Labs Reviewed - No data to display                                                                                                                        Radiology CT Chest Wo Contrast Result Date: 12/12/2023 CLINICAL DATA:  78 year old female status post fall striking back of head on brick. Pain. On Plavix . EXAM: CT CHEST WITHOUT CONTRAST TECHNIQUE: Multidetector CT imaging of the chest was performed following the standard protocol without IV contrast. RADIATION DOSE REDUCTION: This exam was performed according to the departmental dose-optimization program which includes automated exposure control, adjustment of the mA and/or kV according to  patient size and/or use of iterative reconstruction technique. COMPARISON:  CTA Chest 05/17/2023. FINDINGS: Cardiovascular: Streak artifact from chronic right shoulder arthroplasty. Calcified aortic atherosclerosis. Heart size remains normal. No pericardial effusion. Vascular patency is not evaluated in the absence of IV contrast. Mediastinum/Nodes: No evidence of mediastinal hematoma, mass, lymphadenopathy on this noncontrast exam. Moderate chronic gastric hiatal hernia. Lungs/Pleura: Improved lung volumes compared to last year. Major airways are patent. Chronic subpleural right upper lobe scarring anteriorly. No pneumothorax. No pleural effusion. No pulmonary contusion. Improved ventilation bilaterally from the prior CTA. Mild lung base scarring. Upper Abdomen: Cholecystectomy. Negative visible noncontrast liver, spleen, pancreas, adrenal glands and bowel in the upper abdomen. No upper abdominal free air or free fluid identified. Musculoskeletal: Chronic postoperative changes and surgical clips bilateral anterior chest wall. Chronic right total shoulder arthroplasty. Chronic degenerative multilevel thoracic interbody ankylosis from flowing endplate osteophytes. Chronic lower thoracic vacuum disc. Chronic left lateral 6th through 8th rib fractures. No acute rib fracture identified. IMPRESSION: 1. No acute traumatic injury identified in the noncontrast Chest. 2. Chronic left rib fractures. Moderate chronic gastric hiatal hernia. Chronic right upper lobe subpleural lung scarring. Aortic Atherosclerosis (ICD10-I70.0). Electronically Signed   By: Marlise Simpers M.D.   On: 12/12/2023 17:37   DG Shoulder Right Result Date: 12/12/2023 CLINICAL DATA:  78 year old female status post fall striking back of head on brick. Pain. On Plavix . EXAM: RIGHT SHOULDER - 2+ VIEW COMPARISON:  Chest CT today reported separately. Chest radiographs 05/20/2023 and earlier. FINDINGS: Three views at 1605 hours. Chronic right total shoulder  arthroplasty. Hardware appears intact, aligned. No fracture of the right clavicle or scapula is identified. Chronic right AC joint degenerative spurring is moderate. No acute osseous abnormality identified. Right chest is detailed separately. IMPRESSION: No acute fracture or dislocation identified about the right shoulder. Chronic right total shoulder  arthroplasty. Electronically Signed   By: Marlise Simpers M.D.   On: 12/12/2023 17:30   CT Cervical Spine Wo Contrast Result Date: 12/12/2023 CLINICAL DATA:  78 year old female status post fall striking back of head on brick. Pain. On Plavix . EXAM: CT CERVICAL SPINE WITHOUT CONTRAST TECHNIQUE: Multidetector CT imaging of the cervical spine was performed without intravenous contrast. Multiplanar CT image reconstructions were also generated. RADIATION DOSE REDUCTION: This exam was performed according to the departmental dose-optimization program which includes automated exposure control, adjustment of the mA and/or kV according to patient size and/or use of iterative reconstruction technique. COMPARISON:  Prior neck CT 03/23/2016 Head and chest CT today. FINDINGS: Alignment: Straightening of cervical lordosis is stable since 2017. Cervicothoracic junction alignment is within normal limits. Bilateral posterior element alignment is within normal limits. Skull base and vertebrae: Bone mineralization is within normal limits for age. Visualized skull base is intact. No atlanto-occipital dissociation. C1 and C2 appear intact and aligned. No acute osseous abnormality identified. Soft tissues and spinal canal: No prevertebral fluid or swelling. No visible canal hematoma. Negative visible noncontrast neck soft tissues aside from partially retropharyngeal right carotid, normal variant. Disc levels: Widespread chronic cervical spine degeneration, severe disc space loss and endplate spurring X9-J4 through C6-C7, not significantly changed compared to the prior CT. Upper chest: Chest CT  reported separately. Other: Severe bilateral TMJ degeneration. IMPRESSION: 1. No acute traumatic injury identified in the cervical spine. 2. Chronic cervical spine degeneration. Electronically Signed   By: Marlise Simpers M.D.   On: 12/12/2023 17:29   CT Head Wo Contrast Result Date: 12/12/2023 CLINICAL DATA:  78 year old female status post fall striking back of head on brick. Pain. On Plavix . EXAM: CT HEAD WITHOUT CONTRAST TECHNIQUE: Contiguous axial images were obtained from the base of the skull through the vertex without intravenous contrast. RADIATION DOSE REDUCTION: This exam was performed according to the departmental dose-optimization program which includes automated exposure control, adjustment of the mA and/or kV according to patient size and/or use of iterative reconstruction technique. COMPARISON:  Brain MRI 03/24/2021. FINDINGS: Brain: Cerebral volume not significantly changed since 2022. No midline shift, ventriculomegaly, mass effect, evidence of mass lesion, intracranial hemorrhage or evidence of cortically based acute infarction. Patchy, moderate for age scattered bilateral cerebral white matter hypodensity. Otherwise maintained gray-white differentiation. Vascular: No suspicious intracranial vascular hyperdensity. Calcified atherosclerosis at the skull base. Skull: Intact.  Chronic TMJ degeneration appears severe. Sinuses/Orbits: Visualized paranasal sinuses and mastoids are clear. Other: No discrete orbit or scalp soft tissue injury identified. Chronic postoperative changes to both globes. IMPRESSION: 1. No acute intracranial abnormality or acute traumatic injury identified. 2. Moderate for age chronic white matter disease, most commonly due to small vessel ischemia. Electronically Signed   By: Marlise Simpers M.D.   On: 12/12/2023 17:18    Pertinent labs & imaging results that were available during my care of the patient were reviewed by me and considered in my medical decision making (see MDM for  details).  Medications Ordered in ED Medications  traMADol  (ULTRAM ) tablet 50 mg (50 mg Oral Given 12/12/23 1611)  acetaminophen  (TYLENOL ) tablet 1,000 mg (1,000 mg Oral Given 12/12/23 1612)  Procedures Procedures  (including critical care time)  Medical Decision Making / ED Course   MDM:  78 year old presenting to the emergency department after fall.  Patient well-appearing, physical examination with small hematoma to head without wound, painful right shoulder, right posterior chest wall tenderness without crepitus.  Otherwise no signs of other traumatic injury.  Will obtain imaging including CT head, CT cervical spine.  Given chest wall tenderness will obtain CT chest as well.  Will obtain x-ray of the shoulder.  Patient has prior right shoulder arthroplasty.  If imaging is negative anticipate discharge.  Patient denies any loss of consciousness or prodromal symptoms to suggest alternative cause of fall than mechanical fall.  Clinical Course as of 12/12/23 1801  Sun Dec 12, 2023  1800 Imaging negative for any acute injury.  Discussed findings with the patient.  She is feeling better.  Believe patient is stable for discharge. Will discharge patient to home. All questions answered. Patient comfortable with plan of discharge. Return precautions discussed with patient and specified on the after visit summary.  [WS]    Clinical Course User Index [WS] Isaiah Marc, Dozier Genre, MD     Additional history obtained: -Additional history obtained from family -External records from outside source obtained and reviewed including: Chart review including previous notes, labs, imaging, consultation notes including prior notes     Imaging Studies ordered: I ordered imaging studies including CT scans and x-rays  On my interpretation imaging demonstrates no acute injury   I independently visualized and interpreted imaging. I agree with the radiologist interpretation   Medicines ordered and prescription drug management: Meds ordered this encounter  Medications   traMADol  (ULTRAM ) tablet 50 mg   acetaminophen  (TYLENOL ) tablet 1,000 mg    -I have reviewed the patients home medicines and have made adjustments as needed  Reevaluation: After the interventions noted above, I reevaluated the patient and found that their symptoms have improved  Co morbidities that complicate the patient evaluation  Past Medical History:  Diagnosis Date   Allergy    Anxiety    takes Ativan  daily prn   Arthritis    Breast cancer (HCC) 2014   ER+/PR+/HEr2-,    Bruises easily    Colon polyps    2 polyps by report   Coronary artery disease involving native coronary artery of native heart without angina pectoris 01/01/2014   NSTEMI in January 2019 in [place] >> s/p DES x2 to the LAD (procedure complicated by stent thrombosis) Myoview  05/2020: EF 72, no infarct or ischemia; low risk Echocardiogram 05/2019: EF 60-65, GLS -21.5 Cardiac catheterization at [facility] in 08/2017: Normal left main, normal LCx, normal RCA; LAD/diagonal 95%>> PCI    Diverticulosis    GERD (gastroesophageal reflux disease)    occasionally takes Nexium     Gout    Headache    MIgraines- no headaches- has floater in eyes   History of bladder infections    many yrs ago   History of bronchitis    last time at least 22yrs ago   History of hiatal hernia    History of stress incontinence    Hyperlipidemia    takes Pravastatin  daily   Hypertension    Insomnia    takes Ambien nightly prn   NSTEMI (non-ST elevated myocardial infarction) (HCC) 08/2017   OSA (obstructive sleep apnea)    on CPAP   Osteoarthritis    Peripheral edema    takes Furosemide  daily prn   PONV (postoperative nausea and vomiting)  Postmenopausal hormone therapy    Radiation 07/27/13-09/07/13   Right  Breast x 31 treatments   Rhinitis    uses Flonase  prn   Sinus congestion    Status post breast reconstruction 10/15/2014   Bilateral implant removal and DIEP performed in [place], [place]   Tachycardia    takes Metoprolol  daily   Ventricular tachycardia, non-sustained (HCC)    during sleep study 2009 with normal cardiac workup      Dispostion: Disposition decision including need for hospitalization was considered, and patient discharged from emergency department.    Final Clinical Impression(s) / ED Diagnoses Final diagnoses:  Minor head injury, initial encounter  Acute pain of right shoulder     This chart was dictated using voice recognition software.  Despite best efforts to proofread,  errors can occur which can change the documentation meaning.    Mordecai Applebaum, MD 12/12/23 [phone]

## 2023-12-12 NOTE — Discharge Instructions (Addendum)
 We evaluated you for your fall and head injury.  Your testing including CT scans and x-rays were negative for any injury.  Please take 1000 milligrams of Tylenol  every 6 hours as needed for pain.  If your shoulder continues to hurt, please follow-up with your orthopedic surgeon.  Please return for any new or worsening symptoms.

## 2023-12-12 NOTE — ED Triage Notes (Signed)
 Pt fell today hitting the back of her head on brick. Pt reports right shoulder pain. Pt reports taking Plavix .

## 2023-12-14 ENCOUNTER — Other Ambulatory Visit

## 2023-12-14 DIAGNOSIS — M353 Polymyalgia rheumatica: Secondary | ICD-10-CM | POA: Diagnosis not present

## 2023-12-14 DIAGNOSIS — M064 Inflammatory polyarthropathy: Secondary | ICD-10-CM | POA: Diagnosis not present

## 2023-12-14 DIAGNOSIS — R5383 Other fatigue: Secondary | ICD-10-CM | POA: Diagnosis not present

## 2023-12-14 DIAGNOSIS — M2560 Stiffness of unspecified joint, not elsewhere classified: Secondary | ICD-10-CM | POA: Diagnosis not present

## 2023-12-14 DIAGNOSIS — E663 Overweight: Secondary | ICD-10-CM | POA: Diagnosis not present

## 2023-12-14 DIAGNOSIS — M329 Systemic lupus erythematosus, unspecified: Secondary | ICD-10-CM | POA: Diagnosis not present

## 2023-12-14 DIAGNOSIS — Z6827 Body mass index (BMI) 27.0-27.9, adult: Secondary | ICD-10-CM | POA: Diagnosis not present

## 2023-12-14 DIAGNOSIS — R768 Other specified abnormal immunological findings in serum: Secondary | ICD-10-CM | POA: Diagnosis not present

## 2023-12-14 DIAGNOSIS — M0609 Rheumatoid arthritis without rheumatoid factor, multiple sites: Secondary | ICD-10-CM | POA: Diagnosis not present

## 2023-12-14 DIAGNOSIS — M2540 Effusion, unspecified joint: Secondary | ICD-10-CM | POA: Diagnosis not present

## 2023-12-17 ENCOUNTER — Telehealth: Payer: Self-pay

## 2023-12-17 ENCOUNTER — Ambulatory Visit (INDEPENDENT_AMBULATORY_CARE_PROVIDER_SITE_OTHER): Admitting: Gastroenterology

## 2023-12-17 ENCOUNTER — Encounter: Payer: Self-pay | Admitting: Gastroenterology

## 2023-12-17 VITALS — BP 138/80 | HR 88 | Ht 60.75 in | Wt 149.0 lb

## 2023-12-17 DIAGNOSIS — K5909 Other constipation: Secondary | ICD-10-CM | POA: Diagnosis not present

## 2023-12-17 DIAGNOSIS — R1319 Other dysphagia: Secondary | ICD-10-CM | POA: Diagnosis not present

## 2023-12-17 DIAGNOSIS — K449 Diaphragmatic hernia without obstruction or gangrene: Secondary | ICD-10-CM

## 2023-12-17 DIAGNOSIS — K219 Gastro-esophageal reflux disease without esophagitis: Secondary | ICD-10-CM

## 2023-12-17 MED ORDER — PANTOPRAZOLE SODIUM 40 MG PO TBEC: 40.0000 mg | DELAYED_RELEASE_TABLET | Freq: Two times a day (BID) | ORAL | 6 refills | Status: DC

## 2023-12-17 NOTE — Telephone Encounter (Signed)
Med rec and consent done

## 2023-12-17 NOTE — Telephone Encounter (Signed)
  Patient Consent for Virtual Visit        Tracey [name] has provided verbal consent on 12/17/2023 for a virtual visit (video or telephone). Med rec and consent done.    CONSENT FOR VIRTUAL VISIT FOR:  Tracey [name]  By participating in this virtual visit I agree to the following:  I hereby voluntarily request, consent and authorize [facility] and its employed or contracted physicians, physician assistants, nurse practitioners or other licensed health care professionals (the Practitioner), to provide me with telemedicine health care services (the "Services") as deemed necessary by the treating Practitioner. I acknowledge and consent to receive the Services by the Practitioner via telemedicine. I understand that the telemedicine visit will involve communicating with the Practitioner through live audiovisual communication technology and the disclosure of certain medical information by electronic transmission. I acknowledge that I have been given the opportunity to request an in-person assessment or other available alternative prior to the telemedicine visit and am voluntarily participating in the telemedicine visit.  I understand that I have the right to withhold or withdraw my consent to the use of telemedicine in the course of my care at any time, without affecting my right to future care or treatment, and that the Practitioner or I may terminate the telemedicine visit at any time. I understand that I have the right to inspect all information obtained and/or recorded in the course of the telemedicine visit and may receive copies of available information for a reasonable fee.  I understand that some of the potential risks of receiving the Services via telemedicine include:  Delay or interruption in medical evaluation due to technological equipment failure or disruption; Information transmitted may not be sufficient (e.g. poor resolution of images) to allow for appropriate medical decision  making by the Practitioner; and/or  In rare instances, security protocols could fail, causing a breach of personal health information.  Furthermore, I acknowledge that it is my responsibility to provide information about my medical history, conditions and care that is complete and accurate to the best of my ability. I acknowledge that Practitioner's advice, recommendations, and/or decision may be based on factors not within their control, such as incomplete or inaccurate data provided by me or distortions of diagnostic images or specimens that may result from electronic transmissions. I understand that the practice of medicine is not an exact science and that Practitioner makes no warranties or guarantees regarding treatment outcomes. I acknowledge that a copy of this consent can be made available to me via my patient portal [facility] MyChart), or I can request a printed copy by calling the office of [facility].    I understand that my insurance will be billed for this visit.   I have read or had this consent read to me. I understand the contents of this consent, which adequately explains the benefits and risks of the Services being provided via telemedicine.  I have been provided ample opportunity to ask questions regarding this consent and the Services and have had my questions answered to my satisfaction. I give my informed consent for the services to be provided through the use of telemedicine in my medical care

## 2023-12-17 NOTE — Telephone Encounter (Signed)
 As of now we have 02-08-24 and 02-21-24 to see about her procedure in the hospital.  LVM for patient to call back regarding this

## 2023-12-17 NOTE — Telephone Encounter (Signed)
   Name: Tracey [name]  DOB: May 23, 1946  MRN: [id number]  Primary Cardiologist: Arnoldo Lapping, MD   Preoperative team, please contact this patient and set up a phone call appointment for further preoperative risk assessment. Please obtain consent and complete medication review. Thank you for your help.  I confirm that guidance regarding antiplatelet and oral anticoagulation therapy has been completed and, if necessary, noted below.  Per office protocol, if patient is without any new symptoms or concerns at the time of their virtual visit, she may hold Plavix  for 5 days prior to procedure. Please resume Plavix  as soon as possible postprocedure, at the discretion of the surgeon. (Plavix  hold previously addressed in Dr. Katheryne Pane note on 04/22/2023)  I also confirmed the patient resides in the state of [place] . As per [facility] Medical Board telemedicine laws, the patient must reside in the state in which the provider is licensed.   Ava Boatman, NP 12/17/2023, 2:29 PM [facility]

## 2023-12-17 NOTE — Telephone Encounter (Signed)
 Left voicemail for pt to call our office to get appt for a TELE Preop appt and to ask for the preop team.

## 2023-12-17 NOTE — Progress Notes (Signed)
 Chief Complaint: Dysphagia  Referring Provider:  [name], MD      ASSESSMENT AND PLAN;   #1. GERD with eso dysphagia/HH. H/O prev dil by Dr. Nickey Barn 01/2022  #2. Chronic constipation. Neg colon Dr Nickey Barn 5-6 yrs ago(no need to rpt)  Plan: -Ba swallow with tab -EGD with dil after cardio clearence and holding plavix  5 days before. -Protonix  40mg  po BID #60, 6RF. -MiraLAX  17g po every day/colace to continue -If still with problems or any Abdo pain, would proceed with CT scan abdo/pelvis. -D/W pt and daughter Banker) in detail.    Proceed with EGD. I have discussed the risks and benefits. The risks including rare risk of perforation, bleeding, missed UGI neoplasms, risks of anesthesia/sedation. Alternatives were given. Patient is aware and agrees to proceed. All the questions were answered. This will be scheduled in upcoming days. Consent forms were given for review.  HPI:    Tracey [name] is a 78 y.o. female  CAD on plavix (55 to 60% 05/2023), hypertension, hyperlipidemia, OSA (uses O2 only in [place]  d/t altitude only with CPAP) Accompanied by her daughter who is RN History of Present Illness Tracey [name] is a 78 year old female with esophageal issues who presents with difficulty swallowing and gastrointestinal discomfort.  She experiences difficulty swallowing, with food and drinks getting 'hung up' and sometimes causing vomiting. This issue has been present for most of her life but has worsened recently, affecting her ability to eat and drink comfortably, even in public settings. She describes a sensation of food getting stuck at the lower end of her esophagus, confirmed by a barium test in May 2023. She has had her esophagus stretched twice, once in [place] and once locally, but the issue has recurred.  She experiences significant gastrointestinal discomfort, describing her stomach as feeling 'hard like I'm pregnant.' She has a history of constipation and takes Miralax  at  night and Colace in the morning, but her current regimen is not effectively relieving her symptoms.  She has a history of a heart attack and has two stents. She takes Protonix  once a day for reflux, which interferes with her life, as pills often get stuck when swallowing. She also has a hiatal hernia, which may contribute to her symptoms.  She has chronic back pain, described as excruciating and located on the right side. A CT scan of the chest showed chronic multilevel degenerative changes in the thoracic spine and past rib fractures.  She has sleep apnea and uses oxygen  when in [place]  due to altitude, but not at sea level. She has been hospitalized for pneumonia in the past and does not use oxygen  regularly at home.  Per daughter had anemia in the past which has resolved.      Past GI workup: Ba Swallow 12/2021 IMPRESSION: 1. Tortuous esophagus with moderate dysmotility. No mucosal irregularity. 2. A 13 mm barium tab failed to pass the GE junction consistent with significant stricturing of the distal esophagus just above the GE junction. Consider endoscopy for further evaluation  EGD with dil Nickey Barn 2023 with dilatation: Report pending  Also had colonoscopy with Dr. Nickey Barn few years ago.  No need to repeat d/t age Past Medical History:  Diagnosis Date   Allergy    Anxiety    takes Ativan  daily prn   Arthritis    Breast cancer (HCC) 2014   ER+/PR+/HEr2-,    Bruises easily    Colon polyps    2 polyps by report  Coronary artery disease involving native coronary artery of native heart without angina pectoris 01/01/2014   NSTEMI in January 2019 in [place] >> s/p DES x2 to the LAD (procedure complicated by stent thrombosis) Myoview  05/2020: EF 72, no infarct or ischemia; low risk Echocardiogram 05/2019: EF 60-65, GLS -21.5 Cardiac catheter[facility] in 08/2017: Normal left main, normal LCx, normal RCA; LAD/diagonal 95%>> PCI    Diverticulosis    GERD  (gastroesophageal reflux disease)    occasionally takes Nexium     Gout    Headache    MIgraines- no headaches- has floater in eyes   History of bladder infections    many yrs ago   History of bronchitis    last time at least 73yrs ago   History of hiatal hernia    History of stress incontinence    Hyperlipidemia    takes Pravastatin  daily   Hypertension    Insomnia    takes Ambien nightly prn   NSTEMI (non-ST elevated myocardial infarction) (HCC) 08/2017   OSA (obstructive sleep apnea)    on CPAP   Osteoarthritis    Peripheral edema    takes Furosemide  daily prn   PONV (postoperative nausea and vomiting)    Postmenopausal hormone therapy    Radiation 07/27/13-09/07/13   Right Breast x 31 treatments   Rhinitis    uses Flonase  prn   Sinus congestion    Status post breast reconstruction 10/15/2014   Bilateral implant removal and DIEP performed in [place], [place]   Tachycardia    takes Metoprolol  daily   Ventricular tachycardia, non-sustained (HCC)    during sleep study 2009 with normal cardiac workup    Past Surgical History:  Procedure Laterality Date   ABDOMINAL HYSTERECTOMY     with BSO   APPENDECTOMY     ARTERY BIOPSY Left 05/15/2022   Procedure: BIOPSY TEMPORAL ARTERY;  Surgeon: Young Hensen, MD;  Location: [facility] OR;  Service: Vascular;  Laterality: Left;   AXILLARY SENTINEL NODE BIOPSY Right 05/23/2013   Procedure: AXILLARY SENTINEL NODE BIOPSY;  Surgeon: Harlee Lichtenstein, MD;  Location: [facility] OR;  Service: General;  Laterality: Right;  nuc med injection 7:00   BREAST RECONSTRUCTION WITH PLACEMENT OF TISSUE EXPANDER AND FLEX HD (ACELLULAR HYDRATED DERMIS) Bilateral 05/23/2013   Procedure: BILATERAL BREAST RECONSTRUCTION WITH PLACEMENT OF TISSUE EXPANDER AND FLEX HD;  Surgeon: Elidia Grout, MD;  Location: [facility] OR;  Service: Plastics;  Laterality: Bilateral;   CARDIAC CATHETERIZATION  1999   CATARACT EXTRACTION, BILATERAL Bilateral    CHOLECYSTECTOMY     COSMETIC SURGERY      OTHER SURGICAL HISTORY  09/2017   had cyst removal from spine    PERCUTANEOUS CORONARY STENT INTERVENTION (PCI-S)  08/2017   done in [place]      REMOVAL OF BILATERAL TISSUE EXPANDERS WITH PLACEMENT OF BILATERAL BREAST IMPLANTS Bilateral 05/17/2014   Procedure: REMOVAL OF BILATERAL TISSUE EXPANDERS WITH PLACEMENT OF BILATERAL BREAST IMPLANTS FOR RECONSTRUCTION;  Surgeon: Elidia Grout, MD;  Location: [facility] OR;  Service: Plastics;  Laterality: Bilateral;   REVERSE SHOULDER ARTHROPLASTY Right 05/13/2023   Procedure: REVERSE SHOULDER ARTHROPLASTY;  Surgeon: Ellard Gunning, MD;  Location: [facility] ORS;  Service: Orthopedics;  Laterality: Right;   TONSILLECTOMY     TOTAL KNEE ARTHROPLASTY Right 07/26/2018   Procedure: TOTAL KNEE ARTHROPLASTY;  Surgeon: Claiborne Crew, MD;  Location: [facility] ORS;  Service: Orthopedics;  Laterality: Right;  70 mins   TOTAL KNEE ARTHROPLASTY Left 12/02/2021   Procedure: TOTAL KNEE ARTHROPLASTY;  Surgeon: Claiborne Crew, MD;  Location: [facility] ORS;  Service: Orthopedics;  Laterality: Left;   TOTAL MASTECTOMY Bilateral 05/23/2013   Procedure: TOTAL MASTECTOMY;  Surgeon: Harlee Lichtenstein, MD;  Location: [facility] OR;  Service: General;  Laterality: Bilateral;    Family History  Problem Relation Age of Onset   Breast cancer Mother        possible inflammatory breast cancer   Heart attack Father    Heart disease Father    ALS Sister    Breast cancer Maternal Grandmother 88   Heart attack Maternal Grandfather    Other Paternal Grandmother        ruptured appendix   Heart attack Paternal Grandfather    Breast cancer Maternal Aunt        dx in her 23s   Breast cancer Other        maternal great grandmother; dx in her 1s   Thyroid  disease Daughter     Social History   Tobacco Use   Smoking status: Former    Current packs/day: 1.00    Average packs/day: 1 pack/day for 31.5 years (31.5 ttl pk-yrs)    Types: Cigarettes    Start date: 06/09/1992   Smokeless tobacco: Never   Tobacco  comments:    quit at age 47  Vaping Use   Vaping status: Never Used  Substance Use Topics   Alcohol  use: Not Currently    Alcohol /week: 1.0 standard drink of alcohol     Types: 1 Standard drinks or equivalent per week    Comment: 1 a day   Drug use: Not Currently    Types: Other-see comments    Current Outpatient Medications  Medication Sig Dispense Refill   allopurinol  (ZYLOPRIM ) 100 MG tablet TAKE 1 TABLET BY MOUTH ONCE DAILY 90 tablet 0   ASPIRIN  LOW DOSE 81 MG chewable tablet Chew 81 mg by mouth daily.   0   Budeson-Glycopyrrol-Formoterol  (BREZTRI  AEROSPHERE) 160-9-4.8 MCG/ACT AERO Inhale 2 puffs into the lungs in the morning and at bedtime. (Patient taking differently: Inhale 2 puffs into the lungs as needed (wheezing/SOB).) 5.9 g 0   Cholecalciferol  (VITAMIN D3) 50 MCG (2000 UT) TABS Take 2,000 Units by mouth daily.     clopidogrel  (PLAVIX ) 75 MG tablet TAKE 1 TABLET(75 MG) BY MOUTH DAILY 90 tablet 3   cyanocobalamin  (,VITAMIN B-12,) 1000 MCG/ML injection Inject 1,000 mcg into the muscle every 30 (thirty) days.     cyclobenzaprine  (FLEXERIL ) 10 MG tablet Take 10 mg by mouth 3 (three) times daily as needed.     estradiol (ESTRACE) 0.1 MG/GM vaginal cream USE 1 GIVE INTO THE VAGINA EVERY NIGHT AT BEDTIME FOR 14 DAYS THEN TWICE A WEEK THEREAFTER     fluticasone  (FLONASE ) 50 MCG/ACT nasal spray Place 2 sprays into both nostrils daily.     folic acid  (FOLVITE ) 1 MG tablet Take 1 mg by mouth daily.     furosemide  (LASIX ) 40 MG tablet Take 40 mg by mouth daily as needed for fluid.      Hydroxychloroquine  Sulfate 300 MG TABS Take 1 tablet by mouth daily.     ketoconazole  (NIZORAL ) 2 % shampoo Apply 1 Application topically as needed.     loratadine  (CLARITIN ) 10 MG tablet Take 10 mg by mouth daily.     metoprolol  succinate (TOPROL -XL) 25 MG 24 hr tablet TAKE 1 TABLET(25 MG) BY MOUTH DAILY 90 tablet 1   Multiple Vitamin (MULTI-VITAMIN) tablet Take 1 tablet by mouth daily.     MYRBETRIQ  50  MG TB24 tablet Take 1 tablet by mouth daily.     nitroGLYCERIN  (NITROSTAT ) 0.4 MG SL tablet Place 1 tablet (0.4 mg total) under the tongue every 5 (five) minutes as needed for chest pain. 25 tablet 7   ORENCIA CLICKJECT 125 MG/ML SOAJ Inject 125 mg into the skin once a week.     pantoprazole  (PROTONIX ) 40 MG tablet Take 1 tablet (40 mg total) by mouth daily. 90 tablet 1   polyethylene glycol (MIRALAX  / GLYCOLAX ) 17 g packet Take 17 g by mouth daily as needed for mild constipation. 14 each 0   predniSONE  (DELTASONE ) 1 MG tablet Take 2 mg by mouth daily.     Propylene Glycol (SYSTANE COMPLETE) 0.6 % SOLN Place 2 drops into both eyes 2 (two) times daily.     rosuvastatin  (CRESTOR ) 20 MG tablet Take 1 tablet (20 mg total) by mouth daily. 90 tablet 1   sertraline  (ZOLOFT ) 100 MG tablet Take 50 mg by mouth daily.     testosterone  cypionate (DEPOTESTOSTERONE CYPIONATE) 200 MG/ML injection Inject 200 mg into the muscle every 30 (thirty) days.     traMADol  (ULTRAM ) 50 MG tablet Take 100 mg by mouth 2 (two) times daily.     predniSONE  (DELTASONE ) 5 MG tablet Take 5 mg by mouth daily with breakfast. (Patient not taking: Reported on 12/17/2023)     No current facility-administered medications for this visit.    Allergies  Allergen Reactions   Iodine  Hives and Swelling    Other Reaction(s): Unknown   Pseudoeph-Hydrocodone -Gg Other (See Comments)    hallucinations   Ivp Dye [Iodinated Contrast Media] Hives   Methocarbamol  Other (See Comments)    Hallucinations   Oxycodone  Other (See Comments)    hallucinations   Shellfish-Derived Products     Other Reaction(s): Unknown   Sulfa Antibiotics Swelling   Tape Other (See Comments)     tears skin ; ok to use paper tape     Review of Systems:  Constitutional: Denies fever, chills, diaphoresis, appetite change and fatigue.  HEENT: neg   Respiratory: Denies SOB, DOE, cough, chest tightness,  and wheezing.   Cardiovascular: Denies chest pain, palpitations  and leg swelling.  Genitourinary: Denies dysuria, urgency, frequency, hematuria, flank pain and difficulty urinating.  Musculoskeletal: has myalgias, back pain, joint swelling, arthralgias and gait problem.  Skin: No rash.  Neurological: Denies dizziness, seizures, syncope, weakness, light-headedness, numbness and headaches.  Hematological: Denies adenopathy. Easy bruising, personal or family bleeding history  Psychiatric/Behavioral: No anxiety or depression     Physical Exam:    BP 138/80 (BP Location: Left Arm, Patient Position: Sitting, Cuff Size: Normal)   Pulse 88   Ht 5' 0.75" (1.543 m) Comment: height measured without shoes  Wt 149 lb (67.6 kg)   LMP 08/17/1977 (Approximate)   BMI 28.39 kg/m  Wt Readings from Last 3 Encounters:  12/17/23 149 lb (67.6 kg)  12/12/23 149 lb 0.5 oz (67.6 kg)  07/27/23 149 lb (67.6 kg)   Constitutional:  Well-developed, in no acute distress. Psychiatric: Normal mood and affect. Behavior is normal. HEENT: Pupils normal.  Conjunctivae are normal. No scleral icterus. Cardiovascular: Normal rate, regular rhythm. No edema Pulmonary/chest: Effort normal and breath sounds normal. No wheezing, rales or rhonchi. Abdominal: Soft, nondistended. Nontender. Bowel sounds active throughout. There are no masses palpable. No hepatomegaly. Rectal: Deferred Neurological: Alert and oriented to person place and time. Skin: Skin is warm and dry. No rashes noted.  Data Reviewed: I have  personally reviewed following labs and imaging studies  CBC:    Latest Ref Rng & Units 05/19/2023    4:52 AM 05/18/2023    5:26 AM 05/16/2023    9:08 PM  CBC  WBC 4.0 - 10.5 K/uL 7.5  8.8  8.8   Hemoglobin 12.0 - 15.0 g/dL 9.8  16.1  09.6   Hematocrit 36.0 - 46.0 % 32.2  32.3  34.3   Platelets 150 - 400 K/uL 183  192  201     CMP:    Latest Ref Rng & Units 05/19/2023    4:52 AM 05/18/2023    5:26 AM 05/16/2023    9:08 PM  CMP  Glucose 70 - 99 mg/dL 81  045  409   BUN 8 -  23 mg/dL 24  33  19   Creatinine 0.44 - 1.00 mg/dL 8.11  9.14  7.82   Sodium 135 - 145 mmol/L 139  137  139   Potassium 3.5 - 5.1 mmol/L 4.2  3.4  4.3   Chloride 98 - 111 mmol/L 107  104  103   CO2 22 - 32 mmol/L 23  24  27    Calcium  8.9 - 10.3 mg/dL 8.2  8.4  9.1   Total Protein 6.5 - 8.1 g/dL   6.3   Total Bilirubin 0.3 - 1.2 mg/dL   0.5   Alkaline Phos 38 - 126 U/L   51   AST 15 - 41 U/L   25   ALT 0 - 44 U/L   19    Per daughter her hemoglobin is much better now   Radiology Studies: CT Chest Wo Contrast Result Date: 12/12/2023 CLINICAL DATA:  78 year old female status post fall striking back of head on brick. Pain. On Plavix . EXAM: CT CHEST WITHOUT CONTRAST TECHNIQUE: Multidetector CT imaging of the chest was performed following the standard protocol without IV contrast. RADIATION DOSE REDUCTION: This exam was performed according to the departmental dose-optimization program which includes automated exposure control, adjustment of the mA and/or kV according to patient size and/or use of iterative reconstruction technique. COMPARISON:  CTA Chest 05/17/2023. FINDINGS: Cardiovascular: Streak artifact from chronic right shoulder arthroplasty. Calcified aortic atherosclerosis. Heart size remains normal. No pericardial effusion. Vascular patency is not evaluated in the absence of IV contrast. Mediastinum/Nodes: No evidence of mediastinal hematoma, mass, lymphadenopathy on this noncontrast exam. Moderate chronic gastric hiatal hernia. Lungs/Pleura: Improved lung volumes compared to last year. Major airways are patent. Chronic subpleural right upper lobe scarring anteriorly. No pneumothorax. No pleural effusion. No pulmonary contusion. Improved ventilation bilaterally from the prior CTA. Mild lung base scarring. Upper Abdomen: Cholecystectomy. Negative visible noncontrast liver, spleen, pancreas, adrenal glands and bowel in the upper abdomen. No upper abdominal free air or free fluid identified.  Musculoskeletal: Chronic postoperative changes and surgical clips bilateral anterior chest wall. Chronic right total shoulder arthroplasty. Chronic degenerative multilevel thoracic interbody ankylosis from flowing endplate osteophytes. Chronic lower thoracic vacuum disc. Chronic left lateral 6th through 8th rib fractures. No acute rib fracture identified. IMPRESSION: 1. No acute traumatic injury identified in the noncontrast Chest. 2. Chronic left rib fractures. Moderate chronic gastric hiatal hernia. Chronic right upper lobe subpleural lung scarring. Aortic Atherosclerosis (ICD10-I70.0). Electronically Signed   By: Marlise Simpers M.D.   On: 12/12/2023 17:37   DG Shoulder Right Result Date: 12/12/2023 CLINICAL DATA:  78 year old female status post fall striking back of head on brick. Pain. On Plavix . EXAM: RIGHT SHOULDER - 2+ VIEW COMPARISON:  Chest  CT today reported separately. Chest radiographs 05/20/2023 and earlier. FINDINGS: Three views at 1605 hours. Chronic right total shoulder arthroplasty. Hardware appears intact, aligned. No fracture of the right clavicle or scapula is identified. Chronic right AC joint degenerative spurring is moderate. No acute osseous abnormality identified. Right chest is detailed separately. IMPRESSION: No acute fracture or dislocation identified about the right shoulder. Chronic right total shoulder arthroplasty. Electronically Signed   By: Marlise Simpers M.D.   On: 12/12/2023 17:30   CT Cervical Spine Wo Contrast Result Date: 12/12/2023 CLINICAL DATA:  78 year old female status post fall striking back of head on brick. Pain. On Plavix . EXAM: CT CERVICAL SPINE WITHOUT CONTRAST TECHNIQUE: Multidetector CT imaging of the cervical spine was performed without intravenous contrast. Multiplanar CT image reconstructions were also generated. RADIATION DOSE REDUCTION: This exam was performed according to the departmental dose-optimization program which includes automated exposure control, adjustment  of the mA and/or kV according to patient size and/or use of iterative reconstruction technique. COMPARISON:  Prior neck CT 03/23/2016 Head and chest CT today. FINDINGS: Alignment: Straightening of cervical lordosis is stable since 2017. Cervicothoracic junction alignment is within normal limits. Bilateral posterior element alignment is within normal limits. Skull base and vertebrae: Bone mineralization is within normal limits for age. Visualized skull base is intact. No atlanto-occipital dissociation. C1 and C2 appear intact and aligned. No acute osseous abnormality identified. Soft tissues and spinal canal: No prevertebral fluid or swelling. No visible canal hematoma. Negative visible noncontrast neck soft tissues aside from partially retropharyngeal right carotid, normal variant. Disc levels: Widespread chronic cervical spine degeneration, severe disc space loss and endplate spurring O9-G2 through C6-C7, not significantly changed compared to the prior CT. Upper chest: Chest CT reported separately. Other: Severe bilateral TMJ degeneration. IMPRESSION: 1. No acute traumatic injury identified in the cervical spine. 2. Chronic cervical spine degeneration. Electronically Signed   By: Marlise Simpers M.D.   On: 12/12/2023 17:29   CT Head Wo Contrast Result Date: 12/12/2023 CLINICAL DATA:  78 year old female status post fall striking back of head on brick. Pain. On Plavix . EXAM: CT HEAD WITHOUT CONTRAST TECHNIQUE: Contiguous axial images were obtained from the base of the skull through the vertex without intravenous contrast. RADIATION DOSE REDUCTION: This exam was performed according to the departmental dose-optimization program which includes automated exposure control, adjustment of the mA and/or kV according to patient size and/or use of iterative reconstruction technique. COMPARISON:  Brain MRI 03/24/2021. FINDINGS: Brain: Cerebral volume not significantly changed since 2022. No midline shift, ventriculomegaly, mass  effect, evidence of mass lesion, intracranial hemorrhage or evidence of cortically based acute infarction. Patchy, moderate for age scattered bilateral cerebral white matter hypodensity. Otherwise maintained gray-white differentiation. Vascular: No suspicious intracranial vascular hyperdensity. Calcified atherosclerosis at the skull base. Skull: Intact.  Chronic TMJ degeneration appears severe. Sinuses/Orbits: Visualized paranasal sinuses and mastoids are clear. Other: No discrete orbit or scalp soft tissue injury identified. Chronic postoperative changes to both globes. IMPRESSION: 1. No acute intracranial abnormality or acute traumatic injury identified. 2. Moderate for age chronic white matter disease, most commonly due to small vessel ischemia. Electronically Signed   By: Marlise Simpers M.D.   On: 12/12/2023 17:18   Barium swallow films were reviewed CT chest films were reviewed with the patient patient's daughter   Magnus Schuller, MD 12/17/2023, 10:56 AM  Cc: [name], MD

## 2023-12-17 NOTE — Telephone Encounter (Signed)
-----   Message from NULTY,JOHN sent at 12/17/2023  1:55 PM EDT ----- Regarding: RE: EGD at [facility]? Shiesha Jahn,  This pt is a documented difficult intubation and his procedure will need to be done at the hospital.   Thanks,  Cathryn Cobb ----- Message ----- From: Junella Olden, CMA Sent: 12/17/2023  12:13 PM EDT To: Rogena Class, CRNA Subject: EGD at [facility]?                                    Can you please tell us  if this patient is cleared to have a procedure at the [facility] or does she needs to be a hospital procedure? She uses oxygen  when she has to travel to [place] .

## 2023-12-17 NOTE — Patient Instructions (Signed)
 _______________________________________________________  If your blood pressure at your visit was 140/90 or greater, please contact your primary care physician to follow up on this.  _______________________________________________________  If you are age 79 or older, your body mass index should be between 23-30. Your Body mass index is 28.39 kg/m. If this is out of the aforementioned range listed, please consider follow up with your Primary Care Provider.  If you are age 30 or younger, your body mass index should be between 19-25. Your Body mass index is 28.39 kg/m. If this is out of the aformentioned range listed, please consider follow up with your Primary Care Provider.   ________________________________________________________  The [facility] providers would like to encourage you to use MYCHART to communicate with providers for non-urgent requests or questions.  Due to long hold times on the telephone, sending your provider a message by [facility] may be a faster and more efficient way to get a response.  Please allow 48 business hours for a response.  Please remember that this is for non-urgent requests.  _______________________________________________________  We have sent the following medications to your pharmacy for you to pick up at your convenience: Protonix  40mg  2 times a day  Please purchase the following medications over the counter and take as directed: Miralax  17g daily  Colace daily  Please call us  in 3 week regarding an EGD if you havent heard anything from our office.   You will be contacted by our office prior to your procedure for directions on holding your Plavix .  If you do not hear from our office 2 week prior to your scheduled procedure, please call [phone] to discuss.   You have been scheduled for a Barium Esophogram at [facility] (Entrance A) on 12-20-23 at 1pm. Please arrive 30 minutes prior to your appointment for registration. Make certain not to  have anything to eat or drink 3 hours prior to your test. If you need to reschedule for any reason, please contact radiology at [phone] to do so. __________________________________________________________________ A barium swallow is an examination that concentrates on views of the esophagus. This tends to be a double contrast exam (barium and two liquids which, when combined, create a gas to distend the wall of the oesophagus) or single contrast (non-ionic iodine  based). The study is usually tailored to your symptoms so a good history is essential. Attention is paid during the study to the form, structure and configuration of the esophagus, looking for functional disorders (such as aspiration, dysphagia, achalasia, motility and reflux) EXAMINATION You may be asked to change into a gown, depending on the type of swallow being performed. A radiologist and radiographer will perform the procedure. The radiologist will advise you of the type of contrast selected for your procedure and direct you during the exam. You will be asked to stand, sit or lie in several different positions and to hold a small amount of fluid in your mouth before being asked to swallow while the imaging is performed .In some instances you may be asked to swallow barium coated marshmallows to assess the motility of a solid food bolus. The exam can be recorded as a digital or video fluoroscopy procedure. POST PROCEDURE It will take 1-2 days for the barium to pass through your system. To facilitate this, it is important, unless otherwise directed, to increase your fluids for the next 24-48hrs and to resume your normal diet.  This test typically takes about 30 minutes to perform. __________________________________________________________________________________  Thank you,  Dr. Lajuan Pila

## 2023-12-17 NOTE — Telephone Encounter (Addendum)
 [facility] Pre-operative Risk Assessment     Request for surgical clearance:     Endoscopy Procedure  What type of surgery is being performed?     EGD with Dil  When is this surgery scheduled?     TBA  What type of clearance is required ?   Medical/Pharmacy  Are there any medications that need to be held prior to surgery and how long? Can you state if patient is cleared to have a procedure and to hold Plavix  5 days prior  Practice name and name of physician performing surgery?      [facility]  What is your office phone and fax number?      Phone- [phone]  Fax- [phone]  Anesthesia type (None, local, MAC, general) ?       MAC   Please route your response to [employer] CMA (AAMA)

## 2023-12-20 ENCOUNTER — Other Ambulatory Visit (HOSPITAL_COMMUNITY): Payer: Self-pay | Admitting: Registered Nurse

## 2023-12-20 ENCOUNTER — Ambulatory Visit (HOSPITAL_COMMUNITY)
Admission: RE | Admit: 2023-12-20 | Discharge: 2023-12-20 | Disposition: A | Discharge status: Home / Self Care | Source: Ambulatory Visit | Attending: Registered Nurse | Admitting: Registered Nurse

## 2023-12-20 ENCOUNTER — Ambulatory Visit (HOSPITAL_COMMUNITY)
Admission: RE | Admit: 2023-12-20 | Discharge: 2023-12-20 | Disposition: A | Discharge status: Home / Self Care | Source: Ambulatory Visit | Attending: Gastroenterology | Admitting: Gastroenterology

## 2023-12-20 DIAGNOSIS — K219 Gastro-esophageal reflux disease without esophagitis: Secondary | ICD-10-CM | POA: Insufficient documentation

## 2023-12-20 DIAGNOSIS — R410 Disorientation, unspecified: Secondary | ICD-10-CM

## 2023-12-20 DIAGNOSIS — R9082 White matter disease, unspecified: Secondary | ICD-10-CM | POA: Diagnosis not present

## 2023-12-20 DIAGNOSIS — K449 Diaphragmatic hernia without obstruction or gangrene: Secondary | ICD-10-CM | POA: Diagnosis not present

## 2023-12-20 DIAGNOSIS — W19XXXA Unspecified fall, initial encounter: Secondary | ICD-10-CM

## 2023-12-20 DIAGNOSIS — K224 Dyskinesia of esophagus: Secondary | ICD-10-CM | POA: Diagnosis not present

## 2023-12-20 DIAGNOSIS — I129 Hypertensive chronic kidney disease with stage 1 through stage 4 chronic kidney disease, or unspecified chronic kidney disease: Secondary | ICD-10-CM | POA: Diagnosis not present

## 2023-12-20 DIAGNOSIS — S0990XA Unspecified injury of head, initial encounter: Secondary | ICD-10-CM

## 2023-12-20 DIAGNOSIS — D649 Anemia, unspecified: Secondary | ICD-10-CM | POA: Diagnosis not present

## 2023-12-20 DIAGNOSIS — R6883 Chills (without fever): Secondary | ICD-10-CM | POA: Diagnosis not present

## 2023-12-20 DIAGNOSIS — R1310 Dysphagia, unspecified: Secondary | ICD-10-CM | POA: Diagnosis not present

## 2023-12-20 DIAGNOSIS — N1832 Chronic kidney disease, stage 3b: Secondary | ICD-10-CM | POA: Diagnosis not present

## 2023-12-20 DIAGNOSIS — R1319 Other dysphagia: Secondary | ICD-10-CM

## 2023-12-20 DIAGNOSIS — M353 Polymyalgia rheumatica: Secondary | ICD-10-CM | POA: Diagnosis not present

## 2023-12-20 DIAGNOSIS — N390 Urinary tract infection, site not specified: Secondary | ICD-10-CM | POA: Diagnosis not present

## 2023-12-20 DIAGNOSIS — R109 Unspecified abdominal pain: Secondary | ICD-10-CM | POA: Diagnosis not present

## 2023-12-20 NOTE — Telephone Encounter (Addendum)
 Instructions mailed patient asked for 02-08-24 and will call patient tomorrow about her instructions  Case number [id number]  Preop for 01-06-24

## 2023-12-20 NOTE — Addendum Note (Signed)
 Addended by: Linette Rias E on: 12/20/2023 10:09 AM   Modules accepted: Orders

## 2023-12-21 ENCOUNTER — Telehealth: Payer: Self-pay | Admitting: Neurology

## 2023-12-21 NOTE — Telephone Encounter (Signed)
 Spoke to daughter and she is aware and voiced understanding

## 2023-12-21 NOTE — Telephone Encounter (Signed)
 rs appointment

## 2023-12-22 DIAGNOSIS — R0602 Shortness of breath: Secondary | ICD-10-CM | POA: Diagnosis not present

## 2023-12-22 DIAGNOSIS — Z96611 Presence of right artificial shoulder joint: Secondary | ICD-10-CM | POA: Diagnosis not present

## 2023-12-23 NOTE — Telephone Encounter (Signed)
 Patient requesting f/u call in regards to cardiac clearance. Please advise.   Thank you

## 2023-12-23 NOTE — Telephone Encounter (Signed)
 Clearance is suppose to be done on 01-06-24 as my note says   Patient made aware and understands

## 2023-12-25 ENCOUNTER — Other Ambulatory Visit: Payer: Self-pay

## 2023-12-25 ENCOUNTER — Emergency Department (HOSPITAL_COMMUNITY): Admission: EM | Admit: 2023-12-25 | Discharge: 2023-12-25 | Disposition: A | Discharge status: Home / Self Care

## 2023-12-25 ENCOUNTER — Encounter (HOSPITAL_COMMUNITY): Payer: Self-pay

## 2023-12-25 ENCOUNTER — Emergency Department (HOSPITAL_COMMUNITY)

## 2023-12-25 DIAGNOSIS — M25462 Effusion, left knee: Secondary | ICD-10-CM | POA: Diagnosis not present

## 2023-12-25 DIAGNOSIS — S8002XA Contusion of left knee, initial encounter: Secondary | ICD-10-CM | POA: Diagnosis not present

## 2023-12-25 DIAGNOSIS — Z96611 Presence of right artificial shoulder joint: Secondary | ICD-10-CM | POA: Diagnosis not present

## 2023-12-25 DIAGNOSIS — W108XXA Fall (on) (from) other stairs and steps, initial encounter: Secondary | ICD-10-CM | POA: Diagnosis not present

## 2023-12-25 DIAGNOSIS — Z7902 Long term (current) use of antithrombotics/antiplatelets: Secondary | ICD-10-CM | POA: Insufficient documentation

## 2023-12-25 DIAGNOSIS — S0990XA Unspecified injury of head, initial encounter: Secondary | ICD-10-CM | POA: Diagnosis not present

## 2023-12-25 DIAGNOSIS — Z7982 Long term (current) use of aspirin: Secondary | ICD-10-CM | POA: Diagnosis not present

## 2023-12-25 DIAGNOSIS — S299XXA Unspecified injury of thorax, initial encounter: Secondary | ICD-10-CM | POA: Diagnosis not present

## 2023-12-25 DIAGNOSIS — W19XXXA Unspecified fall, initial encounter: Secondary | ICD-10-CM

## 2023-12-25 DIAGNOSIS — I10 Essential (primary) hypertension: Secondary | ICD-10-CM | POA: Diagnosis not present

## 2023-12-25 DIAGNOSIS — Z955 Presence of coronary angioplasty implant and graft: Secondary | ICD-10-CM | POA: Diagnosis not present

## 2023-12-25 DIAGNOSIS — M25552 Pain in left hip: Secondary | ICD-10-CM | POA: Diagnosis not present

## 2023-12-25 DIAGNOSIS — M25562 Pain in left knee: Secondary | ICD-10-CM | POA: Insufficient documentation

## 2023-12-25 DIAGNOSIS — Z96652 Presence of left artificial knee joint: Secondary | ICD-10-CM | POA: Diagnosis not present

## 2023-12-25 DIAGNOSIS — M79662 Pain in left lower leg: Secondary | ICD-10-CM | POA: Diagnosis not present

## 2023-12-25 DIAGNOSIS — I700 Atherosclerosis of aorta: Secondary | ICD-10-CM | POA: Diagnosis not present

## 2023-12-25 MED ORDER — ACETAMINOPHEN 500 MG PO TABS
1000.0000 mg | ORAL_TABLET | Freq: Once | ORAL | Status: AC
  Administered 2023-12-25: 1000 mg via ORAL
  Filled 2023-12-25: qty 2

## 2023-12-25 MED ORDER — LIDOCAINE 5 % EX PTCH: 1.0000 | MEDICATED_PATCH | CUTANEOUS | 0 refills | Status: AC

## 2023-12-25 NOTE — ED Triage Notes (Signed)
 Pt to er, pt states that she is here because she tripped going down the stairs yesterday taking the dogs out, pt states that she fell down 4 stairs and hit the wall at the end, states that she is on Xerelto .

## 2023-12-25 NOTE — Discharge Instructions (Signed)
 Your scans did not show acute traumatic injury.  As such we feel that you are safe for discharge.  You may take over-the-counter Tylenol  and your home tramadol  for pain control.  You may try topical lidocaine  patches to be prescribed you.  You may also try over-the-counter Voltaren gel as directed on the packaging.  However, do not mix Voltaren gel with lidocaine  patches.

## 2023-12-25 NOTE — ED Provider Notes (Signed)
 [place] EMERGENCY DEPARTMENT AT [facility] Provider Note   CSN: [id number] Arrival date & time: 12/25/23  0725     History  Chief Complaint  Patient presents with   Tracey [name] is a 78 y.o. female.  This is a 78 year old female presenting the emergency department for evaluation after a fall downstairs.  Happened approximately 3 AM this morning was taking dogs out, tripped mechanical fall down 4 stairs.  Tracey [name] forward landed on left knee, and struck her head.  She is on Xarelto.  No LOC, no nausea or vomiting.  Complains of pain to her left knee, left hip.  No headache no vision changes no chest pain no shortness of breath, no abdominal pain.   Fall       Home Medications Prior to Admission medications   Medication Sig Start Date End Date Taking? Authorizing Provider  clopidogrel  (PLAVIX ) 75 MG tablet TAKE 1 TABLET(75 MG) BY MOUTH DAILY 07/08/23  Yes Arnoldo Lapping, MD  allopurinol  (ZYLOPRIM ) 100 MG tablet TAKE 1 TABLET BY MOUTH ONCE DAILY 06/25/16   Magrinat, Rozella Cornfield, MD  ASPIRIN  LOW DOSE 81 MG chewable tablet Chew 81 mg by mouth daily.  09/19/17   [provider]  Budeson-Glycopyrrol-Formoterol  (BREZTRI  AEROSPHERE) 160-9-4.8 MCG/ACT AERO Inhale 2 puffs into the lungs in the morning and at bedtime. Patient taking differently: Inhale 2 puffs into the lungs as needed (wheezing/SOB). 06/19/22   Diamond Formica, MD  Cholecalciferol  (VITAMIN D3) 50 MCG (2000 UT) TABS Take 2,000 Units by mouth daily.    [provider]  cyanocobalamin  (,VITAMIN B-12,) 1000 MCG/ML injection Inject 1,000 mcg into the muscle every 30 (thirty) days.    [provider]  cyclobenzaprine  (FLEXERIL ) 10 MG tablet Take 10 mg by mouth 3 (three) times daily as needed. 10/18/17   [provider]  estradiol (ESTRACE) 0.1 MG/GM vaginal cream USE 1 GIVE INTO THE VAGINA EVERY NIGHT AT BEDTIME FOR 14 DAYS THEN TWICE A WEEK THEREAFTER    [provider]   fluticasone  (FLONASE ) 50 MCG/ACT nasal spray Place 2 sprays into both nostrils daily. 01/18/13   [provider]  folic acid  (FOLVITE ) 1 MG tablet Take 1 mg by mouth daily.    [provider]  furosemide  (LASIX ) 40 MG tablet Take 40 mg by mouth daily as needed for fluid.  04/01/13   [provider]  Hydroxychloroquine  Sulfate 300 MG TABS Take 1 tablet by mouth daily.    [provider]  ketoconazole  (NIZORAL ) 2 % shampoo Apply 1 Application topically as needed. 08/24/23   [provider]  loratadine  (CLARITIN ) 10 MG tablet Take 10 mg by mouth daily.    [provider]  metoprolol  succinate (TOPROL -XL) 25 MG 24 hr tablet TAKE 1 TABLET(25 MG) BY MOUTH DAILY 12/10/23   Cooper, Michael, MD  Multiple Vitamin (MULTI-VITAMIN) tablet Take 1 tablet by mouth daily.    [provider]  MYRBETRIQ 50 MG TB24 tablet Take 1 tablet by mouth daily.    [provider]  nitroGLYCERIN  (NITROSTAT ) 0.4 MG SL tablet Place 1 tablet (0.4 mg total) under the tongue every 5 (five) minutes as needed for chest pain. 09/29/23   Arnoldo Lapping, MD  ORENCIA CLICKJECT 125 MG/ML SOAJ Inject 125 mg into the skin once a week. 12/13/23   [provider]  pantoprazole  (PROTONIX ) 40 MG tablet Take 1 tablet (40 mg total) by mouth 2 (two) times daily. 12/17/23   Lajuan Pila,  MD  polyethylene glycol (MIRALAX  / GLYCOLAX ) 17 g packet Take 17 g by mouth daily as needed for mild constipation. 12/03/21   Earnie Gola, PA-C  predniSONE  (DELTASONE ) 1 MG tablet Take 2 mg by mouth daily. 05/21/22   [provider]  predniSONE  (DELTASONE ) 5 MG tablet Take 5 mg by mouth daily with breakfast. Patient not taking: Reported on 12/17/2023    [provider]  Propylene Glycol (SYSTANE COMPLETE) 0.6 % SOLN Place 2 drops into both eyes 2 (two) times daily.    [provider]  rosuvastatin  (CRESTOR ) 20 MG tablet Take 1 tablet (20 mg total) by mouth daily.  12/10/23   Arnoldo Lapping, MD  sertraline  (ZOLOFT ) 100 MG tablet Take 50 mg by mouth daily. 12/06/20   [provider]  testosterone  cypionate (DEPOTESTOSTERONE CYPIONATE) 200 MG/ML injection Inject 200 mg into the muscle every 30 (thirty) days. 01/17/19   [provider]  traMADol  (ULTRAM ) 50 MG tablet Take 100 mg by mouth 2 (two) times daily.    [provider]      Allergies    Iodine , Pseudoeph-hydrocodone -gg, Ivp dye [iodinated contrast media], Methocarbamol , Oxycodone , Shellfish-derived products, Sulfa antibiotics, and Tape    Review of Systems   Review of Systems  Physical Exam Updated Vital Signs BP (!) 157/81 (BP Location: Left Arm)   Pulse 70   Temp 98.7 F (37.1 C) (Oral)   Resp 18   Ht 5' (1.524 m)   Wt 65.8 kg   LMP 08/17/1977 (Approximate)   BMI 28.32 kg/m  Physical Exam Vitals and nursing note reviewed.  Constitutional:      General: She is not in acute distress.    Appearance: She is not toxic-appearing.  HENT:     Head: Normocephalic and atraumatic.     Nose: Nose normal.     Mouth/Throat:     Mouth: Mucous membranes are moist.  Eyes:     Conjunctiva/sclera: Conjunctivae normal.  Cardiovascular:     Rate and Rhythm: Normal rate and regular rhythm.  Pulmonary:     Effort: Pulmonary effort is normal.     Breath sounds: Normal breath sounds.  Abdominal:     General: Abdomen is flat. There is no distension.     Tenderness: There is no abdominal tenderness. There is no guarding or rebound.  Musculoskeletal:        General: Tenderness present. Normal range of motion.     Comments: No midline spinal tenderness.  Chest wall stable nontender.  Pelvis stable, some minor tenderness to the left hip.  Left knee with prior surgical scar, but no effusion, some tenderness to the proximal tibia.  Neurovascularly intact.  Normal strength and sensation in all extremities.  Equal pulses as well.  Neurological:     General: No focal deficit present.      Mental Status: She is alert and oriented to person, place, and time.  Psychiatric:        Mood and Affect: Mood normal.        Behavior: Behavior normal.     ED Results / Procedures / Treatments   Labs (all labs ordered are listed, but only abnormal results are displayed) Labs Reviewed - No data to display  EKG EKG Interpretation Date/Time:  Saturday Dec 25 2023 08:12:26 EDT Ventricular Rate:  74 PR Interval:  148 QRS Duration:  82 QT Interval:  385 QTC Calculation: 428 R Axis:   20  Text Interpretation: Sinus rhythm Low voltage, precordial leads  Confirmed by Elise Guile [phone]) on 12/25/2023 8:19:58 AM  Radiology DG Pelvis 1-2 Views Result Date: 12/25/2023 CLINICAL DATA:  Recent fall, left hip pain EXAM: PELVIS - 1-2 VIEW COMPARISON:  12/30/2021 CT reconstructions FINDINGS: Visualized pelvis and hips are symmetric and intact. No malalignment or acute displaced fracture. No diastasis. Residual barium in the colon related to the esophagram exam 5 days ago. IMPRESSION: No acute abnormality by plain radiography Electronically Signed   By: Melven Stable.  Shick M.D.   On: 12/25/2023 09:15   DG Chest Portable 1 View Result Date: 12/25/2023 CLINICAL DATA:  Recent fall downstairs, injury, trauma, history of right shoulder replacement EXAM: PORTABLE CHEST 1 VIEW COMPARISON:  05/20/2023 FINDINGS: Normal heart size and vascularity. Clear lungs. Negative for pneumonia, edema, CHF, effusion or pneumothorax. Postop changes noted over both breasts. Coronary stents evident. Remote right shoulder reverse arthroplasty. Trachea midline. Aorta atherosclerotic. IMPRESSION: No acute chest process. Electronically Signed   By: Melven Stable.  Shick M.D.   On: 12/25/2023 09:13   CT Head Wo Contrast Result Date: 12/25/2023 CLINICAL DATA:  Head trauma, minor (Age >= 65y), fell downstairs, trauma EXAM: CT HEAD WITHOUT CONTRAST TECHNIQUE: Contiguous axial images were obtained from the base of the skull through the vertex without  intravenous contrast. RADIATION DOSE REDUCTION: This exam was performed according to the departmental dose-optimization program which includes automated exposure control, adjustment of the mA and/or kV according to patient size and/or use of iterative reconstruction technique. COMPARISON:  12/20/2023 FINDINGS: Brain: Stable generalized cerebral atrophy pattern and white matter microvascular ischemic changes throughout both cerebral hemispheres. No acute intracranial hemorrhage, mass lesion, new infarction, midline shift, herniation, hydrocephalus or extra-axial fluid collection. No focal mass effect or edema. Cisterns are patent. No cerebellar abnormality. Overall stable appearance. Vascular: No hyperdense vessel or unexpected calcification. Skull: Normal. Negative for fracture or focal lesion. Sinuses/Orbits: No acute finding. Other: None. IMPRESSION: 1. Stable CT head without contrast. No acute intracranial abnormality by noncontrast CT. 2. Stable atrophy and white matter microvascular ischemic changes. Electronically Signed   By: Melven Stable.  Shick M.D.   On: 12/25/2023 09:10   DG Tibia/Fibula Left Result Date: 12/25/2023 CLINICAL DATA:  Fall, trauma, left hip and lower leg pain, fell down stairs. EXAM: LEFT TIBIA AND FIBULA - 2 VIEW COMPARISON:  12/25/2023 FINDINGS: Left total knee replacement changes noted. Normal alignment. No acute osseous finding or fracture. Soft tissues unremarkable. On the cross-table lateral view, there is a small left knee joint effusion. IMPRESSION: 1. Left total knee replacement. 2. Small left knee joint effusion. 3. No acute osseous finding. Electronically Signed   By: Melven Stable.  Shick M.D.   On: 12/25/2023 09:06    Procedures Procedures    Medications Ordered in ED Medications  acetaminophen  (TYLENOL ) tablet 1,000 mg (1,000 mg Oral Given 12/25/23 0824)    ED Course/ Medical Decision Making/ A&P Clinical Course as of 12/25/23 0923  Sat Dec 25, 2023  0843 DG Chest Portable 1 View No  pneumonia, no pneumothorax on my independent review.  Do not appreciate osseous abnormalities either. [TY]  0843 DG Pelvis 1-2 Views I do not appreciate obvious fracture or osseous abnormality on my independent review. [TY]  0844 DG Tibia/Fibula Left No obvious tibial plateau fracture.  Appears knee replacement hardware grossly intact. [TY]  0844 CT Head Wo Contrast I do not appreciate intracranial hemorrhage on my independent review of images. [TY]    Clinical Course User Index [TY] Rolinda Climes, DO  Medical Decision Making Is a well-appearing 78 year old female presenting emergency department for evaluation after a fall.  She is on Plavix .  Physical exam largely reassuring some minor tenderness to the left knee and proximal tibia.  However, no overt signs of trauma.  Given patient's advanced age will get CT head, chest x-ray, pelvis x-ray and x-ray of tibia.  Mechanical nature, low suspicion for metabolic derangements or acute intra-abdominal pathology.  Will forego labs at this time.  Patient to take a home tramadol  prior to arrival and notes that she cannot take narcotic pain medications as they make her hallucinate.  Will give Tylenol  here awaiting imaging.  Anticipate discharge.  See ED course for final MDM and disposition.  Scans negative; stable for discharge.   Amount and/or Complexity of Data Reviewed Independent Historian:     Details: Son-in-law notes that patient is on Plavix . External Data Reviewed:     Details: Follows with [facility] per chart review Labs:     Details: See above Radiology: ordered and independent interpretation performed. Decision-making details documented in ED Course. ECG/medicine tests: independent interpretation performed.    Details: Appears to be normal sinus rhythm at a rate of 74 bpm.  No ST segment changes to indicate ischemia.  Normal intervals.  QTc 428.  Risk OTC drugs. Decision regarding  hospitalization.          Final Clinical Impression(s) / ED Diagnoses Final diagnoses:  None    Rx / DC Orders ED Discharge Orders     None         Rolinda Climes, DO 12/25/23 4098

## 2023-12-27 ENCOUNTER — Encounter: Payer: Self-pay | Admitting: Neurology

## 2023-12-27 ENCOUNTER — Ambulatory Visit (INDEPENDENT_AMBULATORY_CARE_PROVIDER_SITE_OTHER): Admitting: Neurology

## 2023-12-27 VITALS — BP 145/75 | HR 64 | Ht 62.0 in | Wt 149.0 lb

## 2023-12-27 DIAGNOSIS — G4739 Other sleep apnea: Secondary | ICD-10-CM

## 2023-12-27 DIAGNOSIS — R4182 Altered mental status, unspecified: Secondary | ICD-10-CM

## 2023-12-27 DIAGNOSIS — M7918 Myalgia, other site: Secondary | ICD-10-CM | POA: Insufficient documentation

## 2023-12-27 DIAGNOSIS — M353 Polymyalgia rheumatica: Secondary | ICD-10-CM | POA: Diagnosis not present

## 2023-12-27 MED ORDER — SERTRALINE HCL 100 MG PO TABS: 50.0000 mg | ORAL_TABLET | Freq: Every day | ORAL | 1 refills | Status: AC

## 2023-12-27 NOTE — Progress Notes (Signed)
 Provider:  Neomia Banner, MD  Primary Care Physician:  [name], MD [address] [place] [address]     Referring Provider: [name], Md [address] [place],  [place] [address]          Chief Complaint according to patient   Patient presents with:                HISTORY OF PRESENT ILLNESS:  Tracey [name] is a 78 y.o. female patient who is here for revisit 12/27/2023 for CPAP follow up in a situation of oxygen  dependence.   Problem 1 ) new.  She has recently fallen, in the setting of polymyalgia rheumatica.  Significant cognitive decline, suspected to be Flexaril induced/ or UTI induced.  She had mental status changes.  Osteoarthritis of the spine was seen on x ray and she has osteopenia. No fracture.  Had 3 head CT in 3 weeks. Mental status changes had started on flexaril and at the time her ORINCIA started,  were not  present on 40 mg prednisone , when she felt good.   Had UTI, Low ferritin, low H and H.   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  She was also not excessively sleepy while this treatment seemed to work well.  Right now her leakage has been much increased to almost 32 L/min and her events per hour now have increased accordingly. Her AHI was 18.5 at the most current download none of these are central apneas or all obstructive or obstructive hypopneas.  I attribute this poor functioning to her air leak.  The daughter demonstrated that the tubing that leads to the machine has been broken and fixed but it is not a way to really guarantee air sealing.  I like for her to have these pellets replaced ASAP.  The durable medical equipment company [employer] is aware I also would like to order her a new facemask to make sure that this is not a problem with  seal.  She does not snore according to her husband - and she uses a nasal mask.  In Winter, while  in [place] , she requires oxygen  supplementation.    She has recently fallen, in the setting of polymyalgia rheumatica.  Osteoarthritis of the spine was seen on x ray and she has osteopenia. No fracture.   Had 3 head CT in 3 weeks. Mental status changes.       Interval history : 02-25-2022: patient in pain, swelling in the surgical knee, and positive ANA. Nuclear ANA pattern. Systemic Sclerosis/ polymyositis is in the differential diagnosis.  She has been using her PAP nightly . Residual AHI 4.1/h - satisfying result.  [employer] Sv- 97% compliance.  The sleep part is doing good, the knee and pain is being a problem.        06-12-2021: Central apnea patient. Can't stay in high altitude without supplemental oxygen  for 24/ 7 now.  Brain MRI- Dr Jolee Naval,  mild orthostatic dizziness. Vertigo is much improved, mainly through [place]ley maneuvers.  Ataxia?  Balance remains poor. She may walk and suddenly veer off, goes 'off piste'. I like for her to use a cane.  We repeated today a brief gait examination Mrs. Jearldine Mina gait up and  down the hallway was very steady.   She feels improved by a CBC ointment.    Endurance is poorer since she contracted Covid July 4th. She felt very myalgic and fatigued.  Slowly improving. Today is a good day.    The patient had undergone a split-night polysomnography in May 2022 and I felt that she is still at severe and complex apnea she was treated with CPAP and used oxygen  [place].  She did have PVCs and her single heart electrode, she will continue using CPAP autotitration 5 through 10 cmH2O 1 cm EPR and she uses a nasal mask and small size.     Discussing she is eligible for a new machine 12/2020.   Since the patient has a history of complex sleep apnea and her current AHI is 5.4 without major air leakage and was a device pressure of 90% of  the time of 13 cmH2O I would very much like for her to continue using this machine in spite of being on recall.  I ordering a home sleep test to confirm that her current settings are fine with a minimum pressure of 5 maximum pressure of 13 cmH2O to centimeter EPR heated humidity starting pressure is 4 cmH2O ramp time is only 5 minutes.  I reviewed the medication list, the Epworth Sleepiness Scale was endorsed at 5 points while on treatment.  She is not sleeping at night without it and when she spends time at her [facility] home she is usually on oxygen  at night but for the first time this winter she was asked to use it when she exercises or even walks in daytime she became so severely short of breath.       Review of Systems: Out of a complete 14 system review, the patient complains of only the following symptoms, and all other reviewed systems are negative.:   Social History   Socioeconomic History   Marital status: Married    Spouse name: Not on file   Number of children: 2   Years of education: Not on file   Highest education level: Not on file  Occupational History   Occupation: retired  Tobacco Use   Smoking status: Former    Current packs/day: 1.00    Average packs/day: 1 pack/day for 31.5 years (31.5 ttl pk-yrs)    Types: Cigarettes    Start date: 06/09/1992   Smokeless tobacco: Never   Tobacco comments:    quit at age 7  Vaping Use   Vaping status: Never Used  Substance and Sexual Activity   Alcohol  use: Yes    Alcohol /week: 1.0 standard drink of alcohol     Types: 1 Standard drinks or equivalent per week    Comment: 1 a day   Drug use: Never   Sexual activity: Yes    Birth control/protection: Surgical  Other Topics Concern   Not on file  Social History Narrative   Not on file   Social Drivers of Health   Financial Resource Strain: Not on file  Food Insecurity: Patient Declined (05/17/2023)   Hunger Vital Sign    Worried About Running Out of Food in the Last  Year: Patient declined    Ran Out of Food in the Last Year: Patient declined  Transportation Needs: Patient Declined (05/17/2023)   PRAPARE - Administrator, Civil Service (Medical): Patient declined    Lack of Transportation (Non-Medical): Patient declined  Physical Activity: Not on file  Stress: Not  on file  Social Connections: Not on file    Family History  Problem Relation Age of Onset   Breast cancer Mother        possible inflammatory breast cancer   Heart attack Father    Heart disease Father    ALS Sister    Breast cancer Maternal Grandmother 74   Heart attack Maternal Grandfather    Other Paternal Grandmother        ruptured appendix   Heart attack Paternal Grandfather    Breast cancer Maternal Aunt        dx in her 63s   Breast cancer Other        maternal great grandmother; dx in her 53s   Thyroid  disease Daughter     Past Medical History:  Diagnosis Date   Allergy    Anxiety    takes Ativan  daily prn   Arthritis    Breast cancer (HCC) 2014   ER+/PR+/HEr2-,    Bruises easily    Colon polyps    2 polyps by report   Coronary artery disease involving native coronary artery of native heart without angina pectoris 01/01/2014   NSTEMI in January 2019 in [place] >> s/p DES x2 to the LAD (procedure complicated by stent thrombosis) Myoview  05/2020: EF 72, no infarct or ischemia; low risk Echocardiogram 05/2019: EF 60-65, GLS -21.5 Cardiac catheter[facility] at [facility] in 08/2017: Normal left main, normal LCx, normal RCA; LAD/diagonal 95%>> PCI    Diverticulosis    GERD (gastroesophageal reflux disease)    occasionally takes Nexium     Gout    Headache    MIgraines- no headaches- has floater in eyes   History of bladder infections    many yrs ago   History of bronchitis    last time at least 39yrs ago   History of hiatal hernia    History of stress incontinence    Hyperlipidemia    takes Pravastatin  daily   Hypertension     Insomnia    takes Ambien nightly prn   NSTEMI (non-ST elevated myocardial infarction) (HCC) 08/2017   OSA (obstructive sleep apnea)    on CPAP   Osteoarthritis    Peripheral edema    takes Furosemide  daily prn   PONV (postoperative nausea and vomiting)    Postmenopausal hormone therapy    Radiation 07/27/13-09/07/13   Right Breast x 31 treatments   Rhinitis    uses Flonase  prn   Sinus congestion    Status post breast reconstruction 10/15/2014   Bilateral implant removal and DIEP performed in [place], [place]   Tachycardia    takes Metoprolol  daily   Ventricular tachycardia, non-sustained (HCC)    during sleep study 2009 with normal cardiac workup    Past Surgical History:  Procedure Laterality Date   ABDOMINAL HYSTERECTOMY     with BSO   APPENDECTOMY     ARTERY BIOPSY Left 05/15/2022   Procedure: BIOPSY TEMPORAL ARTERY;  Surgeon: Young Hensen, MD;  Location: [facility] OR;  Service: Vascular;  Laterality: Left;   AXILLARY SENTINEL NODE BIOPSY Right 05/23/2013   Procedure: AXILLARY SENTINEL NODE BIOPSY;  Surgeon: Harlee Lichtenstein, MD;  Location: [facility] OR;  Service: General;  Laterality: Right;  nuc med injection 7:00   BREAST RECONSTRUCTION WITH PLACEMENT OF TISSUE EXPANDER AND FLEX HD (ACELLULAR HYDRATED DERMIS) Bilateral 05/23/2013   Procedure: BILATERAL BREAST RECONSTRUCTION WITH PLACEMENT OF TISSUE EXPANDER AND FLEX HD;  Surgeon: Elidia Grout, MD;  Location: [facility] OR;  Service: Government social research officer;  Laterality: Bilateral;   CARDIAC CATHETERIZATION  1999   CATARACT EXTRACTION, BILATERAL Bilateral    CHOLECYSTECTOMY     COSMETIC SURGERY     OTHER SURGICAL HISTORY  09/2017   had cyst removal from spine    PERCUTANEOUS CORONARY STENT INTERVENTION (PCI-S)  08/2017   done in [place]      REMOVAL OF BILATERAL TISSUE EXPANDERS WITH PLACEMENT OF BILATERAL BREAST IMPLANTS Bilateral 05/17/2014   Procedure: REMOVAL OF BILATERAL TISSUE EXPANDERS WITH PLACEMENT OF BILATERAL BREAST IMPLANTS FOR RECONSTRUCTION;   Surgeon: Elidia Grout, MD;  Location: [facility] OR;  Service: Plastics;  Laterality: Bilateral;   REVERSE SHOULDER ARTHROPLASTY Right 05/13/2023   Procedure: REVERSE SHOULDER ARTHROPLASTY;  Surgeon: Ellard Gunning, MD;  Location: [facility] ORS;  Service: Orthopedics;  Laterality: Right;   TONSILLECTOMY     TOTAL KNEE ARTHROPLASTY Right 07/26/2018   Procedure: TOTAL KNEE ARTHROPLASTY;  Surgeon: Claiborne Crew, MD;  Location: [facility] ORS;  Service: Orthopedics;  Laterality: Right;  70 mins   TOTAL KNEE ARTHROPLASTY Left 12/02/2021   Procedure: TOTAL KNEE ARTHROPLASTY;  Surgeon: Claiborne Crew, MD;  Location: [facility] ORS;  Service: Orthopedics;  Laterality: Left;   TOTAL MASTECTOMY Bilateral 05/23/2013   Procedure: TOTAL MASTECTOMY;  Surgeon: Harlee Lichtenstein, MD;  Location: [facility] OR;  Service: General;  Laterality: Bilateral;     Current Outpatient Medications on File Prior to Visit  Medication Sig Dispense Refill   allopurinol  (ZYLOPRIM ) 100 MG tablet TAKE 1 TABLET BY MOUTH ONCE DAILY 90 tablet 0   Budeson-Glycopyrrol-Formoterol  (BREZTRI  AEROSPHERE) 160-9-4.8 MCG/ACT AERO Inhale 2 puffs into the lungs in the morning and at bedtime. (Patient taking differently: Inhale 2 puffs into the lungs as needed (wheezing/SOB).) 5.9 g 0   Cholecalciferol  (VITAMIN D3) 50 MCG (2000 UT) TABS Take 2,000 Units by mouth daily.     clopidogrel  (PLAVIX ) 75 MG tablet TAKE 1 TABLET(75 MG) BY MOUTH DAILY 90 tablet 3   cyanocobalamin  (,VITAMIN B-12,) 1000 MCG/ML injection Inject 1,000 mcg into the muscle every 30 (thirty) days.     estradiol (ESTRACE) 0.1 MG/GM vaginal cream USE 1 GIVE INTO THE VAGINA EVERY NIGHT AT BEDTIME FOR 14 DAYS THEN TWICE A WEEK THEREAFTER     fluticasone  (FLONASE ) 50 MCG/ACT nasal spray Place 2 sprays into both nostrils daily.     folic acid  (FOLVITE ) 1 MG tablet Take 1 mg by mouth daily.     furosemide  (LASIX ) 40 MG tablet Take 40 mg by mouth daily as needed for fluid.      Hydroxychloroquine  Sulfate 300 MG TABS Take 1  tablet by mouth daily.     ketoconazole  (NIZORAL ) 2 % shampoo Apply 1 Application topically as needed.     lidocaine  (LIDODERM ) 5 % Place 1 patch onto the skin daily. Remove & Discard patch within 12 hours or as directed by MD 9 patch 0   loratadine  (CLARITIN ) 10 MG tablet Take 10 mg by mouth daily.     metoprolol  succinate (TOPROL -XL) 25 MG 24 hr tablet TAKE 1 TABLET(25 MG) BY MOUTH DAILY 90 tablet 1   Multiple Vitamin (MULTI-VITAMIN) tablet Take 1 tablet by mouth daily.     MYRBETRIQ 50 MG TB24 tablet Take 1 tablet by mouth daily.     nitroGLYCERIN  (NITROSTAT ) 0.4 MG SL tablet Place 1 tablet (0.4 mg total) under the tongue every 5 (five) minutes as needed for chest pain. 25 tablet 7   ORENCIA CLICKJECT 125 MG/ML SOAJ Inject 125 mg into the skin once a week.  pantoprazole  (PROTONIX ) 40 MG tablet Take 1 tablet (40 mg total) by mouth 2 (two) times daily. 60 tablet 6   polyethylene glycol (MIRALAX  / GLYCOLAX ) 17 g packet Take 17 g by mouth daily as needed for mild constipation. 14 each 0   predniSONE  (DELTASONE ) 5 MG tablet Take 5 mg by mouth daily with breakfast.     Propylene Glycol (SYSTANE COMPLETE) 0.6 % SOLN Place 2 drops into both eyes 2 (two) times daily.     rosuvastatin  (CRESTOR ) 20 MG tablet Take 1 tablet (20 mg total) by mouth daily. 90 tablet 1   sertraline  (ZOLOFT ) 100 MG tablet Take 50 mg by mouth daily.     testosterone  cypionate (DEPOTESTOSTERONE CYPIONATE) 200 MG/ML injection Inject 200 mg into the muscle every 30 (thirty) days.     traMADol  (ULTRAM ) 50 MG tablet Take 100 mg by mouth 2 (two) times daily.     No current facility-administered medications on file prior to visit.    Allergies  Allergen Reactions   Iodine  Hives and Swelling    Other Reaction(s): Unknown   Pseudoeph-Hydrocodone -Gg Other (See Comments)    hallucinations   Ivp Dye [Iodinated Contrast Media] Hives   Methocarbamol  Other (See Comments)    NO MUSCLE RELAXER'S  Hallucinations   Oxycodone  Other  (See Comments)    hallucinations   Shellfish-Derived Products     Other Reaction(s): Unknown   Sulfa Antibiotics Swelling   Tape Other (See Comments)     tears skin ; ok to use paper tape      DIAGNOSTIC DATA (LABS, IMAGING, TESTING) - I reviewed patient records, labs, notes, testing and imaging myself where available.  Lab Results  Component Value Date   WBC 7.5 05/19/2023   HGB 9.8 (L) 05/19/2023   HCT 32.2 (L) 05/19/2023   MCV 97.3 05/19/2023   PLT 183 05/19/2023      Component Value Date/Time   NA 139 05/19/2023 0452   NA 143 01/20/2018 0952   NA 141 01/06/2017 1316   K 4.2 05/19/2023 0452   K 4.4 01/06/2017 1316   CL 107 05/19/2023 0452   CO2 23 05/19/2023 0452   CO2 23 01/06/2017 1316   GLUCOSE 81 05/19/2023 0452   GLUCOSE 84 01/06/2017 1316   BUN 24 (H) 05/19/2023 0452   BUN 18 01/20/2018 0952   BUN 23.3 01/06/2017 1316   CREATININE 1.11 (H) 05/19/2023 0452   CREATININE 1.27 (H) 01/28/2022 1515   CREATININE 0.9 01/06/2017 1316   CALCIUM  8.2 (L) 05/19/2023 0452   CALCIUM  10.0 01/06/2017 1316   PROT 6.3 (L) 05/16/2023 2108   PROT 6.6 05/22/2019 0756   PROT 7.2 01/06/2017 1316   ALBUMIN 3.8 05/16/2023 2108   ALBUMIN 4.5 05/22/2019 0756   ALBUMIN 4.3 01/06/2017 1316   AST 25 05/16/2023 2108   AST 26 03/28/2020 1443   AST 27 01/06/2017 1316   ALT 19 05/16/2023 2108   ALT 24 03/28/2020 1443   ALT 40 01/06/2017 1316   ALKPHOS 51 05/16/2023 2108   ALKPHOS 85 01/06/2017 1316   BILITOT 0.5 05/16/2023 2108   BILITOT 0.4 03/28/2020 1443   BILITOT 0.47 01/06/2017 1316   GFRNONAA 51 (L) 05/19/2023 0452   GFRNONAA 39 (L) 03/28/2020 1443   GFRAA 45 (L) 03/28/2020 1443   Lab Results  Component Value Date   CHOL 128 05/22/2019   HDL 58 05/22/2019   LDLCALC 49 05/22/2019   TRIG 121 05/22/2019   CHOLHDL 2.2 05/22/2019   No results  found for: "HGBA1C" Lab Results  Component Value Date   VITAMINB12 392 05/18/2023   No results found for: "TSH"  PHYSICAL  EXAM:  Today's Vitals   12/27/23 1332  BP: (!) 145/75  Pulse: 64  Weight: 149 lb (67.6 kg)  Height: 5\' 2"  (1.575 m)   Body mass index is 27.25 kg/m.   Wt Readings from Last 3 Encounters:  12/27/23 149 lb (67.6 kg)  12/25/23 145 lb (65.8 kg)  12/17/23 149 lb (67.6 kg)     Ht Readings from Last 3 Encounters:  12/27/23 5\' 2"  (1.575 m)  12/25/23 5' (1.524 m)  12/17/23 5' 0.75" (1.543 m)      General: The patient is awake, alert and appears not in acute distress. The patient is well groomed. Head: Normocephalic, atraumatic. Neck is supple. Cardiovascular:  Regular rate and cardiac rhythm by pulse,  without distended neck veins. Respiratory: Lungs are clear to auscultation.  Skin:  Without evidence of ankle edema, or rash. Trunk: The patient's posture is erect.   NEUROLOGIC EXAM: The patient is awake and alert, oriented to place and time.   Memory subjective described as intact.- she is reporting hallucinations  of visual and auditory type.   Attention span & concentration ability appears normal.  Speech is fluent,  without  dysarthria, dysphonia or aphasia.  Mood and affect are appropriate.   Cranial nerves: no loss of smell or taste reported  Pupils are equal and briskly reactive to light. Extraocular movements in vertical and horizontal planes were intact and without nystagmus. No Diplopia. Visual fields by finger perimetry are intact. Hearing was intact to soft voice and finger rubbing.    Facial sensation intact to fine touch.  Facial motor strength is symmetric and tongue and uvula move midline.  Neck ROM : rotation, tilt and flexion extension were normal for age and shoulder shrug was symmetrical.    Motor exam:  Symmetric bulk, tone but restricted ROM, shoulder  - she h was able to lift both legs against resistance.     Able to relax fully,  tone without cog- wheeling, symmetric grip strength .   Sensory:  Fine touch, pinprick and vibration were tested  and   normal.  Proprioception tested in the upper extremities was normal.   Coordination: Rapid alternating movements in the fingers/hands were of normal speed.  The Finger-to-nose maneuver was intact without evidence of ataxia, dysmetria or tremor.   Gait and station: Patient could not rise unassisted from a seated position, walked with a cane assistive device.  Toe and heel walk were deferred.  Deep tendon reflexes: in the  upper and lower extremities are absent  Babinski response was deferred.     ASSESSMENT AND PLAN:   78 y.o. year old female  here with:  New onset mental status changes, hallucinations, has been fallen and is on treatment for severe chronic pain and for polymyalgia rheumatica, and with pain restricted ROM.     1)  falls and pain, chronic pain . The family observed the mental status changes on Flexaril.  I like to d/c this drug.   2)  She is in constant follow up with rheumatology and treated  for polymyalgia on a biological :  She needs prednisone  for now, may benefit from a slight increase for now.  She has not felt better on muscle relaxants.   So I d/c Flexaril and I asked to hold MYRBEtriq for the next 7 days.    3)  Complex  sleep apnea,continue on CPAP but needs new supplies, her daughter, a Engineer, civil (consulting) , will call [employer].   I plan to follow up either personally or through our NP within 4-5 months.   I would like to thank [name], MD for allowing me to meet with and to take care of this pleasant patient.   CC: Her Rheumatology Physician.   After spending a total time of  45  minutes face to face and additional time for physical and neurologic examination, review of laboratory studies,  personal review of imaging studies, reports and results of other testing and review of referral information / records as far as provided in visit,   Electronically signed by: Neomia Banner, MD 12/27/2023 2:04 PM  [facility] and [facility] Board  certified by The [employer] of Sleep Medicine and Diplomate of the [employer] of [employer] Medicine. Board certified In Neurology through the ABPN, Fellow of the [employer] of [employer].

## 2023-12-27 NOTE — Patient Instructions (Signed)
 ASSESSMENT AND PLAN:      78 y.o. year old female  here with:   New onset mental status changes, hallucinations, has been fallen and is on treatment for severe chronic pain and for polymyalgia rheumatica, and with pain restricted ROM.      1)  falls and pain, chronic pain . The family observed the mental status changes on Flexaril.  I like to d/c this drug.    2)  She is in constant follow up with rheumatology and treated  for polymyalgia on a biological :  She needs prednisone  for now, may benefit from a slight increase for now.  She has not felt better on muscle relaxants.   So I d/c Flexaril and I asked to hold MYRBEtriq for the next 7 days.      3)  Complex sleep apnea,continue on CPAP but needs new supplies, her daughter, a nurse , will call [employer].    I plan to follow up either personally or through our NP within 4-5 months.    I would like to thank [name], MD for allowing me to meet with and to take care of this pleasant patient.    CC: Her Rheumatology Physician.

## 2023-12-28 ENCOUNTER — Ambulatory Visit: Payer: Self-pay

## 2023-12-28 DIAGNOSIS — Z6826 Body mass index (BMI) 26.0-26.9, adult: Secondary | ICD-10-CM | POA: Diagnosis not present

## 2023-12-28 DIAGNOSIS — M0609 Rheumatoid arthritis without rheumatoid factor, multiple sites: Secondary | ICD-10-CM | POA: Diagnosis not present

## 2023-12-28 DIAGNOSIS — E663 Overweight: Secondary | ICD-10-CM | POA: Diagnosis not present

## 2023-12-28 DIAGNOSIS — M2560 Stiffness of unspecified joint, not elsewhere classified: Secondary | ICD-10-CM | POA: Diagnosis not present

## 2023-12-28 DIAGNOSIS — M7918 Myalgia, other site: Secondary | ICD-10-CM | POA: Diagnosis not present

## 2023-12-28 DIAGNOSIS — M353 Polymyalgia rheumatica: Secondary | ICD-10-CM | POA: Diagnosis not present

## 2024-01-06 ENCOUNTER — Ambulatory Visit: Attending: Cardiology

## 2024-01-06 DIAGNOSIS — Z01810 Encounter for preprocedural cardiovascular examination: Secondary | ICD-10-CM | POA: Insufficient documentation

## 2024-01-06 NOTE — Telephone Encounter (Signed)
 Left voicemail on patient's number

## 2024-01-06 NOTE — Progress Notes (Signed)
 Virtual Visit via Telephone Note   Because of Tracey [name] co-morbid illnesses, she is at least at moderate risk for complications without adequate follow up.  This format is felt to be most appropriate for this patient at this time.  Due to technical limitations with video connection (technology), today's appointment will be conducted as an audio only telehealth visit, and Tracey [name] verbally agreed to proceed in this manner.   All issues noted in this document were discussed and addressed.  No physical exam could be performed with this format.  Evaluation Performed:  Preoperative cardiovascular risk assessment _____________   Date:  01/06/2024   Patient ID:  Tracey [name], DOB [birth year], MRN [id number] Patient Location:  Home Provider location:   Office  Primary Care Provider:  [name], MD Primary Cardiologist:  Arnoldo Lapping, MD  Chief Complaint / Patient Profile   78 y.o. y/o female with a h/o CAD s/p NSTEMI 08/2017 with PCI to LAD, HTN, HLD, CKD, aortic atherosclerosis, breast CA s/p double mastectomy and XRT who is pending endoscopy with dilatation and presents today for telephonic preoperative cardiovascular risk assessment.  History of Present Illness    Tracey [name] is a 78 y.o. female who presents via audio/video conferencing for a telehealth visit today.  Pt was last seen in cardiology clinic on 04/22/2023 by Dr. Arlester Ladd.  At that time Tracey [name] was doing well and was getting clearance for upcoming right shoulder replacement. She was seen in the ED on 11/22/23 and 12/25/2023 for a fall x-rays and CT showing no injuries the patient is now pending procedure as outlined above. Since her last visit, she has been doing well with no residual issues from her recent falls.  She denies chest pain, shortness of breath, lower extremity edema, fatigue, palpitations, melena, hematuria, hemoptysis, diaphoresis, weakness, presyncope, syncope, orthopnea, and PND.    Past  Medical History    Past Medical History:  Diagnosis Date   Allergy    Anxiety    takes Ativan  daily prn   Arthritis    Breast cancer (HCC) 2014   ER+/PR+/HEr2-,    Bruises easily    Colon polyps    2 polyps by report   Coronary artery disease involving native coronary artery of native heart without angina pectoris 01/01/2014   NSTEMI in January 2019 in [place] >> s/p DES x2 to the LAD (procedure complicated by stent thrombosis) Myoview  05/2020: EF 72, no infarct or ischemia; low risk Echocardiogram 05/2019: EF 60-65, GLS -21.5 Cardiac catheterization at [facility] in 08/2017: Normal left main, normal LCx, normal RCA; LAD/diagonal 95%>> PCI    Diverticulosis    GERD (gastroesophageal reflux disease)    occasionally takes Nexium     Gout    Headache    MIgraines- no headaches- has floater in eyes   History of bladder infections    many yrs ago   History of bronchitis    last time at least 43yrs ago   History of hiatal hernia    History of stress incontinence    Hyperlipidemia    takes Pravastatin  daily   Hypertension    Insomnia    takes Ambien nightly prn   NSTEMI (non-ST elevated myocardial infarction) (HCC) 08/2017   OSA (obstructive sleep apnea)    on CPAP   Osteoarthritis    Peripheral edema    takes Furosemide  daily prn   PONV (postoperative nausea and vomiting)    Postmenopausal hormone therapy  Radiation 07/27/13-09/07/13   Right Breast x 31 treatments   Rhinitis    uses Flonase  prn   Sinus congestion    Status post breast reconstruction 10/15/2014   Bilateral implant removal and DIEP performed in [place], [place]   Tachycardia    takes Metoprolol  daily   Ventricular tachycardia, non-sustained (HCC)    during sleep study 2009 with normal cardiac workup   Past Surgical History:  Procedure Laterality Date   ABDOMINAL HYSTERECTOMY     with BSO   APPENDECTOMY     ARTERY BIOPSY Left 05/15/2022   Procedure: BIOPSY TEMPORAL ARTERY;  Surgeon: Young Hensen, MD;  Location: [facility] OR;  Service: Vascular;  Laterality: Left;   AXILLARY SENTINEL NODE BIOPSY Right 05/23/2013   Procedure: AXILLARY SENTINEL NODE BIOPSY;  Surgeon: Harlee Lichtenstein, MD;  Location: [facility] OR;  Service: General;  Laterality: Right;  nuc med injection 7:00   BREAST RECONSTRUCTION WITH PLACEMENT OF TISSUE EXPANDER AND FLEX HD (ACELLULAR HYDRATED DERMIS) Bilateral 05/23/2013   Procedure: BILATERAL BREAST RECONSTRUCTION WITH PLACEMENT OF TISSUE EXPANDER AND FLEX HD;  Surgeon: Elidia Grout, MD;  Location: [facility] OR;  Service: Plastics;  Laterality: Bilateral;   CARDIAC CATHETERIZATION  1999   CATARACT EXTRACTION, BILATERAL Bilateral    CHOLECYSTECTOMY     COSMETIC SURGERY     OTHER SURGICAL HISTORY  09/2017   had cyst removal from spine    PERCUTANEOUS CORONARY STENT INTERVENTION (PCI-S)  08/2017   done in [place]      REMOVAL OF BILATERAL TISSUE EXPANDERS WITH PLACEMENT OF BILATERAL BREAST IMPLANTS Bilateral 05/17/2014   Procedure: REMOVAL OF BILATERAL TISSUE EXPANDERS WITH PLACEMENT OF BILATERAL BREAST IMPLANTS FOR RECONSTRUCTION;  Surgeon: Elidia Grout, MD;  Location: [facility] OR;  Service: Plastics;  Laterality: Bilateral;   REVERSE SHOULDER ARTHROPLASTY Right 05/13/2023   Procedure: REVERSE SHOULDER ARTHROPLASTY;  Surgeon: Ellard Gunning, MD;  Location: [facility] ORS;  Service: Orthopedics;  Laterality: Right;   TONSILLECTOMY     TOTAL KNEE ARTHROPLASTY Right 07/26/2018   Procedure: TOTAL KNEE ARTHROPLASTY;  Surgeon: Claiborne Crew, MD;  Location: [facility] ORS;  Service: Orthopedics;  Laterality: Right;  70 mins   TOTAL KNEE ARTHROPLASTY Left 12/02/2021   Procedure: TOTAL KNEE ARTHROPLASTY;  Surgeon: Claiborne Crew, MD;  Location: [facility] ORS;  Service: Orthopedics;  Laterality: Left;   TOTAL MASTECTOMY Bilateral 05/23/2013   Procedure: TOTAL MASTECTOMY;  Surgeon: Harlee Lichtenstein, MD;  Location: [facility] OR;  Service: General;  Laterality: Bilateral;    Allergies  Allergies  Allergen Reactions    Iodine  Hives and Swelling    Other Reaction(s): Unknown   Pseudoeph-Hydrocodone -Gg Other (See Comments)    hallucinations   Ivp Dye [Iodinated Contrast Media] Hives   Methocarbamol  Other (See Comments)    NO MUSCLE RELAXER'S  Hallucinations   Oxycodone  Other (See Comments)    hallucinations   Shellfish-Derived Products     Other Reaction(s): Unknown   Sulfa Antibiotics Swelling   Tape Other (See Comments)     tears skin ; ok to use paper tape     Home Medications    Prior to Admission medications   Medication Sig Start Date End Date Taking? Authorizing Provider  allopurinol  (ZYLOPRIM ) 100 MG tablet TAKE 1 TABLET BY MOUTH ONCE DAILY 06/25/16   Magrinat, Rozella Cornfield, MD  Budeson-Glycopyrrol-Formoterol  (BREZTRI  AEROSPHERE) 160-9-4.8 MCG/ACT AERO Inhale 2 puffs into the lungs in the morning and at bedtime. Patient taking differently: Inhale 2 puffs into the lungs as needed (wheezing/SOB). 06/19/22   Wert, Michael  B, MD  Cholecalciferol  (VITAMIN D3) 50 MCG (2000 UT) TABS Take 2,000 Units by mouth daily.    [provider]  clopidogrel  (PLAVIX ) 75 MG tablet TAKE 1 TABLET(75 MG) BY MOUTH DAILY 07/08/23   Arnoldo Lapping, MD  cyanocobalamin  (,VITAMIN B-12,) 1000 MCG/ML injection Inject 1,000 mcg into the muscle every 30 (thirty) days.    [provider]  estradiol (ESTRACE) 0.1 MG/GM vaginal cream USE 1 GIVE INTO THE VAGINA EVERY NIGHT AT BEDTIME FOR 14 DAYS THEN TWICE A WEEK THEREAFTER    [provider]  fluticasone  (FLONASE ) 50 MCG/ACT nasal spray Place 2 sprays into both nostrils daily. 01/18/13   [provider]  folic acid  (FOLVITE ) 1 MG tablet Take 1 mg by mouth daily.    [provider]  furosemide  (LASIX ) 40 MG tablet Take 40 mg by mouth daily as needed for fluid.  04/01/13   [provider]  Hydroxychloroquine  Sulfate 300 MG TABS Take 1 tablet by mouth daily.    [provider]  ketoconazole  (NIZORAL ) 2 % shampoo Apply 1  Application topically as needed. 08/24/23   [provider]  lidocaine  (LIDODERM ) 5 % Place 1 patch onto the skin daily. Remove & Discard patch within 12 hours or as directed by MD 12/25/23   Rolinda Climes, DO  loratadine  (CLARITIN ) 10 MG tablet Take 10 mg by mouth daily.    [provider]  metoprolol  succinate (TOPROL -XL) 25 MG 24 hr tablet TAKE 1 TABLET(25 MG) BY MOUTH DAILY 12/10/23   Cooper, Michael, MD  Multiple Vitamin (MULTI-VITAMIN) tablet Take 1 tablet by mouth daily.    [provider]  MYRBETRIQ 50 MG TB24 tablet Take 1 tablet by mouth daily. Hold for 7 days , 01-03-2024.    [provider]  nitroGLYCERIN  (NITROSTAT ) 0.4 MG SL tablet Place 1 tablet (0.4 mg total) under the tongue every 5 (five) minutes as needed for chest pain. 09/29/23   Arnoldo Lapping, MD  ORENCIA CLICKJECT 125 MG/ML SOAJ Inject 125 mg into the skin once a week. 12/13/23   [provider]  pantoprazole  (PROTONIX ) 40 MG tablet Take 1 tablet (40 mg total) by mouth 2 (two) times daily. 12/17/23   Lajuan Pila, MD  polyethylene glycol (MIRALAX  / GLYCOLAX ) 17 g packet Take 17 g by mouth daily as needed for mild constipation. 12/03/21   Earnie Gola, PA-C  predniSONE  (DELTASONE ) 5 MG tablet Take 5 mg by mouth daily with breakfast.    [provider]  Propylene Glycol (SYSTANE COMPLETE) 0.6 % SOLN Place 2 drops into both eyes 2 (two) times daily.    [provider]  rosuvastatin  (CRESTOR ) 20 MG tablet Take 1 tablet (20 mg total) by mouth daily. 12/10/23   Arnoldo Lapping, MD  sertraline  (ZOLOFT ) 100 MG tablet Take 0.5 tablets (50 mg total) by mouth daily. 12/27/23   Dohmeier, Raoul Byes, MD  testosterone  cypionate (DEPOTESTOSTERONE CYPIONATE) 200 MG/ML injection Inject 200 mg into the muscle every 30 (thirty) days. 01/17/19   [provider]  traMADol  (ULTRAM ) 50 MG tablet Take 100 mg by mouth 2 (two) times daily.    [provider]    Physical Exam     Vital Signs:  Tracey [name] does not have vital signs available for review today.None   Given telephonic nature of communication, physical exam is limited. AAOx3. NAD. Normal affect.  Speech and respirations are unlabored.  Accessory Clinical Findings    None  Assessment & Plan  1.  Preoperative Cardiovascular Risk Assessment:  Patient's RCRI score is 0.9%  The patient affirms she has been doing well without any new cardiac symptoms. They are able to achieve 6 METS without cardiac limitations. Therefore, based on ACC/AHA guidelines, the patient would be at acceptable risk for the planned procedure without further cardiovascular testing. The patient was advised that if she develops new symptoms prior to surgery to contact our office to arrange for a follow-up visit, and she verbalized understanding.   The patient was advised that if she develops new symptoms prior to surgery to contact our office to arrange for a follow-up visit, and she verbalized understanding.  Patient can hold Plavix  5 days prior to procedure and should restart postprocedure when surgically safe and hemostasis is achieved.  A copy of this note will be routed to requesting surgeon.  Time:   Today, I have spent 6 minutes with the patient with telehealth technology discussing medical history, symptoms, and management plan.     Francene Ing, Retha Cast, NP  01/06/2024, 7:09 AM

## 2024-01-06 NOTE — Telephone Encounter (Signed)
 Called daughter mimi and phone kept ringing. Clearance approved for patient to hold plavix  5 days prior to the procedure

## 2024-01-07 NOTE — Telephone Encounter (Signed)
 Patient returned call

## 2024-01-07 NOTE — Telephone Encounter (Signed)
 Patient made aware and was told that I mailed her physical copy put and that if she doesn't have it in the next week to call back and ill mail her a new one and she voiced understanding

## 2024-01-07 NOTE — Telephone Encounter (Signed)
 Patient is returning your call.

## 2024-01-11 DIAGNOSIS — Z6826 Body mass index (BMI) 26.0-26.9, adult: Secondary | ICD-10-CM | POA: Diagnosis not present

## 2024-01-11 DIAGNOSIS — M0609 Rheumatoid arthritis without rheumatoid factor, multiple sites: Secondary | ICD-10-CM | POA: Diagnosis not present

## 2024-01-11 DIAGNOSIS — M2560 Stiffness of unspecified joint, not elsewhere classified: Secondary | ICD-10-CM | POA: Diagnosis not present

## 2024-01-11 DIAGNOSIS — M353 Polymyalgia rheumatica: Secondary | ICD-10-CM | POA: Diagnosis not present

## 2024-01-11 DIAGNOSIS — E663 Overweight: Secondary | ICD-10-CM | POA: Diagnosis not present

## 2024-01-11 DIAGNOSIS — M7918 Myalgia, other site: Secondary | ICD-10-CM | POA: Diagnosis not present

## 2024-01-18 DIAGNOSIS — R350 Frequency of micturition: Secondary | ICD-10-CM | POA: Diagnosis not present

## 2024-01-18 DIAGNOSIS — N3021 Other chronic cystitis with hematuria: Secondary | ICD-10-CM | POA: Diagnosis not present

## 2024-01-21 DIAGNOSIS — M47816 Spondylosis without myelopathy or radiculopathy, lumbar region: Secondary | ICD-10-CM | POA: Diagnosis not present

## 2024-01-21 DIAGNOSIS — M5416 Radiculopathy, lumbar region: Secondary | ICD-10-CM | POA: Diagnosis not present

## 2024-01-25 ENCOUNTER — Encounter: Payer: Self-pay | Admitting: Rheumatology

## 2024-01-26 ENCOUNTER — Ambulatory Visit: Payer: Medicare Other

## 2024-01-26 VITALS — BP 130/80 | HR 75 | Temp 98.2°F | Resp 18 | Ht 61.75 in | Wt 142.0 lb

## 2024-01-26 DIAGNOSIS — M810 Age-related osteoporosis without current pathological fracture: Secondary | ICD-10-CM

## 2024-01-26 MED ORDER — DENOSUMAB 60 MG/ML ~~LOC~~ SOSY
60.0000 mg | PREFILLED_SYRINGE | Freq: Once | SUBCUTANEOUS | Status: AC
  Administered 2024-01-26: 60 mg via SUBCUTANEOUS
  Filled 2024-01-26: qty 1

## 2024-01-26 NOTE — Progress Notes (Signed)
 Diagnosis: Osteoporosis  Provider:  Mannam, Praveen MD  Procedure: Injection  Procrit (epoetin alfa), Dose: 60 mg, Site: subcutaneous, Number of injections: 1  Injection Site(s): Left arm   Discharge: Condition: Good, Destination: Home . AVS Declined  Performed by:  Shirly Dow, RN

## 2024-01-31 ENCOUNTER — Encounter (HOSPITAL_COMMUNITY): Payer: Self-pay | Admitting: Gastroenterology

## 2024-01-31 ENCOUNTER — Other Ambulatory Visit: Payer: Self-pay

## 2024-01-31 NOTE — Progress Notes (Addendum)
 Anesthesia Review:  PCP: [name]  Cardiologist : [name],NP telephone visit clearance- 01/06/24  Neuro- Dohmeier   PPM/ ICD: Device Orders: Rep Notified:  Chest x-ray : 12/25/23- 1 view  EKG : 12/25/23  Echo : 05/20/23  Stress test: Cardiac Cath :   Activity level: can do a flight of stairs wtihout difficutly  Sleep Study/ CPAP : has cpap  Fasting Blood Sugar :      / Checks Blood Sugar -- times a day:    Blood Thinner/ Instructions /Last Dose: ASA / Instructions/ Last Dose :    Plavix  - alst dose on 02/03/2024 per pt    01/31/24- LVMM Asked for call back  PT called back on 01/31/24 and med hx nad preprocedure instructonis completed.

## 2024-02-02 ENCOUNTER — Other Ambulatory Visit: Payer: Self-pay | Admitting: Internal Medicine

## 2024-02-03 ENCOUNTER — Telehealth: Payer: Self-pay

## 2024-02-03 NOTE — Telephone Encounter (Signed)
 Procedure:EGD Procedure date: 02/08/24 Procedure location: [facility] Arrival Time: 9:30 Spoke with the patient Y/N: Y Any prep concerns? N  Has the patient obtained the prep from the pharmacy ? N Do you have a care partner and transportation: Y Any additional concerns? N

## 2024-02-07 NOTE — Anesthesia Preprocedure Evaluation (Signed)
 Anesthesia Evaluation  Patient identified by MRN, date of birth, ID band Patient awake    Reviewed: Allergy & Precautions, NPO status , Patient's Chart, lab work & pertinent test results  History of Anesthesia Complications (+) PONV and history of anesthetic complications  Airway Mallampati: II  TM Distance: >3 FB Neck ROM: Full    Dental no notable dental hx. (+) Teeth Intact, Dental Advisory Given   Pulmonary sleep apnea and Continuous Positive Airway Pressure Ventilation , former smoker   Pulmonary exam normal breath sounds clear to auscultation       Cardiovascular hypertension, + CAD, + Past MI (nonstemi) and + Cardiac Stents  Normal cardiovascular exam Rhythm:Regular Rate:Normal     Neuro/Psych  Neuromuscular disease    GI/Hepatic hiatal hernia,GERD  ,,  Endo/Other    Renal/GU Renal InsufficiencyRenal disease     Musculoskeletal  (+) Arthritis ,    Abdominal   Peds  Hematology   Anesthesia Other Findings All: see list  Breast CA xraytx   Reproductive/Obstetrics                             Anesthesia Physical Anesthesia Plan  ASA: 3  Anesthesia Plan: MAC   Post-op Pain Management: Minimal or no pain anticipated   Induction:   PONV Risk Score and Plan: 3 and Treatment may vary due to age or medical condition and Propofol  infusion  Airway Management Planned: Natural Airway and Nasal Cannula  Additional Equipment: None  Intra-op Plan:   Post-operative Plan:   Informed Consent: I have reviewed the patients History and Physical, chart, labs and discussed the procedure including the risks, benefits and alternatives for the proposed anesthesia with the patient or authorized representative who has indicated his/her understanding and acceptance.     Dental advisory given  Plan Discussed with: CRNA  Anesthesia Plan Comments: (GERD and Dysphagia for EGD)         Anesthesia Quick Evaluation

## 2024-02-08 ENCOUNTER — Encounter (HOSPITAL_COMMUNITY): Payer: Self-pay | Admitting: Gastroenterology

## 2024-02-08 ENCOUNTER — Other Ambulatory Visit: Payer: Self-pay

## 2024-02-08 ENCOUNTER — Ambulatory Visit (HOSPITAL_COMMUNITY)
Admission: RE | Admit: 2024-02-08 | Discharge: 2024-02-08 | Disposition: A | Discharge status: Home / Self Care | Attending: Gastroenterology | Admitting: Gastroenterology

## 2024-02-08 ENCOUNTER — Encounter (HOSPITAL_COMMUNITY)
Admission: RE | Disposition: A | Discharge status: Home / Self Care | Payer: Self-pay | Source: Home / Self Care | Attending: Gastroenterology

## 2024-02-08 ENCOUNTER — Ambulatory Visit (HOSPITAL_COMMUNITY): Payer: Self-pay | Admitting: Anesthesiology

## 2024-02-08 ENCOUNTER — Ambulatory Visit (HOSPITAL_BASED_OUTPATIENT_CLINIC_OR_DEPARTMENT_OTHER): Payer: Self-pay | Admitting: Anesthesiology

## 2024-02-08 DIAGNOSIS — I252 Old myocardial infarction: Secondary | ICD-10-CM | POA: Insufficient documentation

## 2024-02-08 DIAGNOSIS — M1990 Unspecified osteoarthritis, unspecified site: Secondary | ICD-10-CM | POA: Diagnosis not present

## 2024-02-08 DIAGNOSIS — K2960 Other gastritis without bleeding: Secondary | ICD-10-CM | POA: Diagnosis not present

## 2024-02-08 DIAGNOSIS — E785 Hyperlipidemia, unspecified: Secondary | ICD-10-CM | POA: Diagnosis not present

## 2024-02-08 DIAGNOSIS — I2510 Atherosclerotic heart disease of native coronary artery without angina pectoris: Secondary | ICD-10-CM | POA: Diagnosis not present

## 2024-02-08 DIAGNOSIS — K5909 Other constipation: Secondary | ICD-10-CM | POA: Diagnosis not present

## 2024-02-08 DIAGNOSIS — I129 Hypertensive chronic kidney disease with stage 1 through stage 4 chronic kidney disease, or unspecified chronic kidney disease: Secondary | ICD-10-CM | POA: Diagnosis not present

## 2024-02-08 DIAGNOSIS — I10 Essential (primary) hypertension: Secondary | ICD-10-CM | POA: Insufficient documentation

## 2024-02-08 DIAGNOSIS — R1319 Other dysphagia: Secondary | ICD-10-CM

## 2024-02-08 DIAGNOSIS — R1310 Dysphagia, unspecified: Secondary | ICD-10-CM | POA: Diagnosis not present

## 2024-02-08 DIAGNOSIS — K449 Diaphragmatic hernia without obstruction or gangrene: Secondary | ICD-10-CM | POA: Diagnosis not present

## 2024-02-08 DIAGNOSIS — Z87891 Personal history of nicotine dependence: Secondary | ICD-10-CM | POA: Insufficient documentation

## 2024-02-08 DIAGNOSIS — N289 Disorder of kidney and ureter, unspecified: Secondary | ICD-10-CM | POA: Insufficient documentation

## 2024-02-08 DIAGNOSIS — G4733 Obstructive sleep apnea (adult) (pediatric): Secondary | ICD-10-CM | POA: Diagnosis not present

## 2024-02-08 DIAGNOSIS — K222 Esophageal obstruction: Secondary | ICD-10-CM

## 2024-02-08 DIAGNOSIS — G709 Myoneural disorder, unspecified: Secondary | ICD-10-CM | POA: Diagnosis not present

## 2024-02-08 DIAGNOSIS — Q399 Congenital malformation of esophagus, unspecified: Secondary | ICD-10-CM | POA: Diagnosis not present

## 2024-02-08 DIAGNOSIS — K2289 Other specified disease of esophagus: Secondary | ICD-10-CM | POA: Diagnosis not present

## 2024-02-08 DIAGNOSIS — K219 Gastro-esophageal reflux disease without esophagitis: Secondary | ICD-10-CM | POA: Diagnosis not present

## 2024-02-08 DIAGNOSIS — Z955 Presence of coronary angioplasty implant and graft: Secondary | ICD-10-CM | POA: Insufficient documentation

## 2024-02-08 DIAGNOSIS — N1832 Chronic kidney disease, stage 3b: Secondary | ICD-10-CM | POA: Diagnosis not present

## 2024-02-08 DIAGNOSIS — I25118 Atherosclerotic heart disease of native coronary artery with other forms of angina pectoris: Secondary | ICD-10-CM

## 2024-02-08 DIAGNOSIS — G8929 Other chronic pain: Secondary | ICD-10-CM | POA: Diagnosis not present

## 2024-02-08 DIAGNOSIS — K2970 Gastritis, unspecified, without bleeding: Secondary | ICD-10-CM | POA: Diagnosis not present

## 2024-02-08 HISTORY — PX: BALLOON DILATION: SHX5330

## 2024-02-08 HISTORY — DX: Pneumonia, unspecified organism: J18.9

## 2024-02-08 HISTORY — PX: ESOPHAGOGASTRODUODENOSCOPY: SHX5428

## 2024-02-08 SURGERY — EGD (ESOPHAGOGASTRODUODENOSCOPY)
Anesthesia: Monitor Anesthesia Care

## 2024-02-08 MED ORDER — PROPOFOL 10 MG/ML IV BOLUS
INTRAVENOUS | Status: AC
  Filled 2024-02-08: qty 20

## 2024-02-08 MED ORDER — PROPOFOL 500 MG/50ML IV EMUL
INTRAVENOUS | Status: AC
Start: 2024-02-08 — End: 2024-02-08
  Filled 2024-02-08: qty 50

## 2024-02-08 MED ORDER — DEXMEDETOMIDINE HCL IN NACL 80 MCG/20ML IV SOLN
INTRAVENOUS | Status: DC | PRN
  Administered 2024-02-08: 8 ug via INTRAVENOUS

## 2024-02-08 MED ORDER — SODIUM CHLORIDE 0.9 % IV SOLN: INTRAVENOUS | Status: DC | PRN

## 2024-02-08 MED ORDER — LIDOCAINE 2% (20 MG/ML) 5 ML SYRINGE
INTRAMUSCULAR | Status: DC | PRN
  Administered 2024-02-08: 100 mg via INTRAVENOUS

## 2024-02-08 MED ORDER — PROPOFOL 500 MG/50ML IV EMUL
INTRAVENOUS | Status: DC | PRN
  Administered 2024-02-08: 100 ug/kg/min via INTRAVENOUS

## 2024-02-08 MED ORDER — PROPOFOL 10 MG/ML IV BOLUS
INTRAVENOUS | Status: DC | PRN
  Administered 2024-02-08 (×2): 50 mg via INTRAVENOUS
  Administered 2024-02-08: 30 mg via INTRAVENOUS

## 2024-02-08 NOTE — Anesthesia Postprocedure Evaluation (Signed)
 Anesthesia Post Note  Patient: Taraji Mungo Pollak  Procedure(s) Performed: EGD (ESOPHAGOGASTRODUODENOSCOPY) BALLOON DILATION     Patient location during evaluation: Endoscopy Anesthesia Type: MAC Level of consciousness: awake and alert Pain management: pain level controlled Vital Signs Assessment: post-procedure vital signs reviewed and stable Respiratory status: spontaneous breathing, nonlabored ventilation, respiratory function stable and patient connected to nasal cannula oxygen  Cardiovascular status: blood pressure returned to baseline and stable Postop Assessment: no apparent nausea or vomiting Anesthetic complications: no  No notable events documented.  Last Vitals:  Vitals:   02/08/24 1143 02/08/24 1145  BP: 130/60   Pulse: 62 63  Resp: 15 16  Temp:    SpO2: 99% 100%    Last Pain:  Vitals:   02/08/24 1140  TempSrc:   PainSc: 0-No pain                 Garnette DELENA Gab

## 2024-02-08 NOTE — Anesthesia Procedure Notes (Signed)
 Procedure Name: MAC Date/Time: 02/08/2024 10:47 AM  Performed by: Nada Corean CROME, CRNAPre-anesthesia Checklist: Patient identified, Suction available, Emergency Drugs available, Patient being monitored and Timeout performed Patient Re-evaluated:Patient Re-evaluated prior to induction Oxygen  Delivery Method: Simple face mask Preoxygenation: Pre-oxygenation with 100% oxygen  Induction Type: IV induction Placement Confirmation: positive ETCO2 Dental Injury: Teeth and Oropharynx as per pre-operative assessment  Comments: POM mask used

## 2024-02-08 NOTE — H&P (Signed)
 Chief Complaint: Dysphagia   Referring Provider:  Larnell Hamilton, MD        ASSESSMENT AND PLAN;    #1. GERD with eso dysphagia/HH. H/O prev dil by Dr. Rollin 01/2022   #2. Chronic constipation. Neg colon Dr Rollin 5-6 yrs ago(no need to rpt)   Plan: -Ba swallow with tab -EGD with dil after cardio clearence and holding plavix  5 days before. -Protonix  40mg  po BID #60, 6RF. -MiraLAX  17g po every day/colace to continue -If still with problems or any Abdo pain, would proceed with CT scan abdo/pelvis. -D/W pt and daughter Banker) in detail.       Proceed with EGD. I have discussed the risks and benefits. The risks including rare risk of perforation, bleeding, missed UGI neoplasms, risks of anesthesia/sedation. Alternatives were given. Patient is aware and agrees to proceed. All the questions were answered. This will be scheduled in upcoming days. Consent forms were given for review.   HPI:     Tracey [name] is a 78 y.o. female  CAD on plavix (55 to 60% 05/2023), hypertension, hyperlipidemia, OSA (uses O2 only in [place]  d/t altitude only with CPAP) Accompanied by her daughter who is RN History of Present Illness Tracey [name] is a 78 year old female with esophageal issues who presents with difficulty swallowing and gastrointestinal discomfort.   She experiences difficulty swallowing, with food and drinks getting 'hung up' and sometimes causing vomiting. This issue has been present for most of her life but has worsened recently, affecting her ability to eat and drink comfortably, even in public settings. She describes a sensation of food getting stuck at the lower end of her esophagus, confirmed by a barium test in May 2023. She has had her esophagus stretched twice, once in [place] and once locally, but the issue has recurred.   She experiences significant gastrointestinal discomfort, describing her stomach as feeling 'hard like I'm pregnant.' She has a history of constipation and  takes Miralax  at night and Colace in the morning, but her current regimen is not effectively relieving her symptoms.   She has a history of a heart attack and has two stents. She takes Protonix  once a day for reflux, which interferes with her life, as pills often get stuck when swallowing. She also has a hiatal hernia, which may contribute to her symptoms.   She has chronic back pain, described as excruciating and located on the right side. A CT scan of the chest showed chronic multilevel degenerative changes in the thoracic spine and past rib fractures.   She has sleep apnea and uses oxygen  when in [place]  due to altitude, but not at sea level. She has been hospitalized for pneumonia in the past and does not use oxygen  regularly at home.   Per daughter had anemia in the past which has resolved.          Past GI workup: Ba Swallow 12/2021 IMPRESSION: 1. Tortuous esophagus with moderate dysmotility. No mucosal irregularity. 2. A 13 mm barium tab failed to pass the GE junction consistent with significant stricturing of the distal esophagus just above the GE junction. Consider endoscopy for further evaluation   EGD with dil Rollin 2023 with dilatation: Report pending   Also had colonoscopy with Dr. Rollin few years ago.  No need to repeat d/t age     Past Medical History:  Diagnosis Date   Allergy     Anxiety      takes Ativan   daily prn   Arthritis     Breast cancer (HCC) 2014    ER+/PR+/HEr2-,    Bruises easily     Colon polyps      2 polyps by report   Coronary artery disease involving native coronary artery of native heart without angina pectoris 01/01/2014    NSTEMI in January 2019 in [place] >> s/p DES x2 to the LAD (procedure complicated by stent thrombosis) Myoview  05/2020: EF 72, no infarct or ischemia; low risk Echocardiogram 05/2019: EF 60-65, GLS -21.5 Cardiac catheterization at [facility] in 08/2017: Normal left main, normal LCx, normal RCA; LAD/diagonal  95%>> PCI    Diverticulosis     GERD (gastroesophageal reflux disease)      occasionally takes Nexium     Gout     Headache      MIgraines- no headaches- has floater in eyes   History of bladder infections      many yrs ago   History of bronchitis      last time at least 62yrs ago   History of hiatal hernia     History of stress incontinence     Hyperlipidemia      takes Pravastatin  daily   Hypertension     Insomnia      takes Ambien nightly prn   NSTEMI (non-ST elevated myocardial infarction) (HCC) 08/2017   OSA (obstructive sleep apnea)      on CPAP   Osteoarthritis     Peripheral edema      takes Furosemide  daily prn   PONV (postoperative nausea and vomiting)     Postmenopausal hormone therapy     Radiation 07/27/13-09/07/13    Right Breast x 31 treatments   Rhinitis      uses Flonase  prn   Sinus congestion     Status post breast reconstruction 10/15/2014    Bilateral implant removal and DIEP performed in [place], [place]   Tachycardia      takes Metoprolol  daily   Ventricular tachycardia, non-sustained (HCC)      during sleep study 2009 with normal cardiac workup               Past Surgical History:  Procedure Laterality Date   ABDOMINAL HYSTERECTOMY        with BSO   APPENDECTOMY       ARTERY BIOPSY Left 05/15/2022    Procedure: BIOPSY TEMPORAL ARTERY;  Surgeon: Gretta Lonni PARAS, MD;  Location: [facility] OR;  Service: Vascular;  Laterality: Left;   AXILLARY SENTINEL NODE BIOPSY Right 05/23/2013    Procedure: AXILLARY SENTINEL NODE BIOPSY;  Surgeon: Krystal PARAS Russell, MD;  Location: [facility] OR;  Service: General;  Laterality: Right;  nuc med injection 7:00   BREAST RECONSTRUCTION WITH PLACEMENT OF TISSUE EXPANDER AND FLEX HD (ACELLULAR HYDRATED DERMIS) Bilateral 05/23/2013    Procedure: BILATERAL BREAST RECONSTRUCTION WITH PLACEMENT OF TISSUE EXPANDER AND FLEX HD;  Surgeon: Alm Sick, MD;  Location: [facility] OR;  Service: Plastics;  Laterality: Bilateral;   CARDIAC CATHETERIZATION    1999   CATARACT EXTRACTION, BILATERAL Bilateral     CHOLECYSTECTOMY       COSMETIC SURGERY       OTHER SURGICAL HISTORY   09/2017    had cyst removal from spine    PERCUTANEOUS CORONARY STENT INTERVENTION (PCI-S)   08/2017    done in [place]      REMOVAL OF BILATERAL TISSUE EXPANDERS WITH PLACEMENT OF BILATERAL BREAST IMPLANTS Bilateral 05/17/2014    Procedure: REMOVAL OF  BILATERAL TISSUE EXPANDERS WITH PLACEMENT OF BILATERAL BREAST IMPLANTS FOR RECONSTRUCTION;  Surgeon: Alm Sick, MD;  Location[facility] OR;  Service: Plastics;  Laterality: Bilateral;   REVERSE SHOULDER ARTHROPLASTY Right 05/13/2023    Procedure: REVERSE SHOULDER ARTHROPLASTY;  Surgeon: Melita Drivers, MD;  Location: [facility] ORS;  Service: Orthopedics;  Laterality: Right;   TONSILLECTOMY       TOTAL KNEE ARTHROPLASTY Right 07/26/2018    Procedure: TOTAL KNEE ARTHROPLASTY;  Surgeon: Ernie Cough, MD;  Location: [facility] ORS;  Service: Orthopedics;  Laterality: Right;  70 mins   TOTAL KNEE ARTHROPLASTY Left 12/02/2021    Procedure: TOTAL KNEE ARTHROPLASTY;  Surgeon: Ernie Cough, MD;  Location: [facility] ORS;  Service: Orthopedics;  Laterality: Left;   TOTAL MASTECTOMY Bilateral 05/23/2013    Procedure: TOTAL MASTECTOMY;  Surgeon: Krystal JINNY Russell, MD;  Location: [facility] OR;  Service: General;  Laterality: Bilateral;               Family History  Problem Relation Age of Onset   Breast cancer Mother          possible inflammatory breast cancer   Heart attack Father     Heart disease Father     ALS Sister     Breast cancer Maternal Grandmother 53   Heart attack Maternal Grandfather     Other Paternal Grandmother          ruptured appendix   Heart attack Paternal Grandfather     Breast cancer Maternal Aunt          dx in her 50s   Breast cancer Other          maternal great grandmother; dx in her 56s   Thyroid  disease Daughter            Social History  Social History         Tobacco Use   Smoking status: Former      Current  packs/day: 1.00      Average packs/day: 1 pack/day for 31.5 years (31.5 ttl pk-yrs)      Types: Cigarettes      Start date: 06/09/1992   Smokeless tobacco: Never   Tobacco comments:      quit at age 85  Vaping Use   Vaping status: Never Used  Substance Use Topics   Alcohol  use: Not Currently      Alcohol /week: 1.0 standard drink of alcohol       Types: 1 Standard drinks or equivalent per week      Comment: 1 a day   Drug use: Not Currently      Types: Other-see comments              Current Outpatient Medications  Medication Sig Dispense Refill   allopurinol  (ZYLOPRIM ) 100 MG tablet TAKE 1 TABLET BY MOUTH ONCE DAILY 90 tablet 0   ASPIRIN  LOW DOSE 81 MG chewable tablet Chew 81 mg by mouth daily.    0   Budeson-Glycopyrrol-Formoterol  (BREZTRI  AEROSPHERE) 160-9-4.8 MCG/ACT AERO Inhale 2 puffs into the lungs in the morning and at bedtime. (Patient taking differently: Inhale 2 puffs into the lungs as needed (wheezing/SOB).) 5.9 g 0   Cholecalciferol  (VITAMIN D3) 50 MCG (2000 UT) TABS Take 2,000 Units by mouth daily.       clopidogrel  (PLAVIX ) 75 MG tablet TAKE 1 TABLET(75 MG) BY MOUTH DAILY 90 tablet 3   cyanocobalamin  (,VITAMIN B-12,) 1000 MCG/ML injection Inject 1,000 mcg into the muscle every 30 (thirty) days.  cyclobenzaprine  (FLEXERIL ) 10 MG tablet Take 10 mg by mouth 3 (three) times daily as needed.       estradiol (ESTRACE) 0.1 MG/GM vaginal cream USE 1 GIVE INTO THE VAGINA EVERY NIGHT AT BEDTIME FOR 14 DAYS THEN TWICE A WEEK THEREAFTER       fluticasone  (FLONASE ) 50 MCG/ACT nasal spray Place 2 sprays into both nostrils daily.       folic acid  (FOLVITE ) 1 MG tablet Take 1 mg by mouth daily.       furosemide  (LASIX ) 40 MG tablet Take 40 mg by mouth daily as needed for fluid.        Hydroxychloroquine  Sulfate 300 MG TABS Take 1 tablet by mouth daily.       ketoconazole  (NIZORAL ) 2 % shampoo Apply 1 Application topically as needed.       loratadine  (CLARITIN ) 10 MG tablet Take  10 mg by mouth daily.       metoprolol  succinate (TOPROL -XL) 25 MG 24 hr tablet TAKE 1 TABLET(25 MG) BY MOUTH DAILY 90 tablet 1   Multiple Vitamin (MULTI-VITAMIN) tablet Take 1 tablet by mouth daily.       MYRBETRIQ 50 MG TB24 tablet Take 1 tablet by mouth daily.       nitroGLYCERIN  (NITROSTAT ) 0.4 MG SL tablet Place 1 tablet (0.4 mg total) under the tongue every 5 (five) minutes as needed for chest pain. 25 tablet 7   ORENCIA CLICKJECT 125 MG/ML SOAJ Inject 125 mg into the skin once a week.       pantoprazole  (PROTONIX ) 40 MG tablet Take 1 tablet (40 mg total) by mouth daily. 90 tablet 1   polyethylene glycol (MIRALAX  / GLYCOLAX ) 17 g packet Take 17 g by mouth daily as needed for mild constipation. 14 each 0   predniSONE  (DELTASONE ) 1 MG tablet Take 2 mg by mouth daily.       Propylene Glycol (SYSTANE COMPLETE) 0.6 % SOLN Place 2 drops into both eyes 2 (two) times daily.       rosuvastatin  (CRESTOR ) 20 MG tablet Take 1 tablet (20 mg total) by mouth daily. 90 tablet 1   sertraline  (ZOLOFT ) 100 MG tablet Take 50 mg by mouth daily.       testosterone  cypionate (DEPOTESTOSTERONE CYPIONATE) 200 MG/ML injection Inject 200 mg into the muscle every 30 (thirty) days.       traMADol  (ULTRAM ) 50 MG tablet Take 100 mg by mouth 2 (two) times daily.       predniSONE  (DELTASONE ) 5 MG tablet Take 5 mg by mouth daily with breakfast. (Patient not taking: Reported on 12/17/2023)          No current facility-administered medications for this visit.        Allergies       Allergies  Allergen Reactions   Iodine  Hives and Swelling      Other Reaction(s): Unknown   Pseudoeph-Hydrocodone -Gg Other (See Comments)      hallucinations   Ivp Dye [Iodinated Contrast Media] Hives   Methocarbamol  Other (See Comments)      Hallucinations   Oxycodone  Other (See Comments)      hallucinations   Shellfish-Derived Products        Other Reaction(s): Unknown   Sulfa Antibiotics Swelling   Tape Other (See Comments)        tears skin ; ok to use paper tape         Review of Systems:  Constitutional: Denies fever, chills, diaphoresis, appetite change and fatigue.  HEENT: neg   Respiratory: Denies SOB, DOE, cough, chest tightness,  and wheezing.   Cardiovascular: Denies chest pain, palpitations and leg swelling.  Genitourinary: Denies dysuria, urgency, frequency, hematuria, flank pain and difficulty urinating.  Musculoskeletal: has myalgias, back pain, joint swelling, arthralgias and gait problem.  Skin: No rash.  Neurological: Denies dizziness, seizures, syncope, weakness, light-headedness, numbness and headaches.  Hematological: Denies adenopathy. Easy bruising, personal or family bleeding history  Psychiatric/Behavioral: No anxiety or depression       Physical Exam:     BP 138/80 (BP Location: Left Arm, Patient Position: Sitting, Cuff Size: Normal)   Pulse 88   Ht 5' 0.75 (1.543 m) Comment: height measured without shoes  Wt 149 lb (67.6 kg)   LMP 08/17/1977 (Approximate)   BMI 28.39 kg/m     Wt Readings from Last 3 Encounters:  12/17/23 149 lb (67.6 kg)  12/12/23 149 lb 0.5 oz (67.6 kg)  07/27/23 149 lb (67.6 kg)    Constitutional:  Well-developed, in no acute distress. Psychiatric: Normal mood and affect. Behavior is normal. HEENT: Pupils normal.  Conjunctivae are normal. No scleral icterus. Cardiovascular: Normal rate, regular rhythm. No edema Pulmonary/chest: Effort normal and breath sounds normal. No wheezing, rales or rhonchi. Abdominal: Soft, nondistended. Nontender. Bowel sounds active throughout. There are no masses palpable. No hepatomegaly. Rectal: Deferred Neurological: Alert and oriented to person place and time. Skin: Skin is warm and dry. No rashes noted.   Data Reviewed: I have personally reviewed following labs and imaging studies   CBC:     Latest Ref Rng & Units 05/19/2023    4:52 AM 05/18/2023    5:26 AM 05/16/2023    9:08 PM  CBC  WBC 4.0 - 10.5 K/uL 7.5  8.8  8.8    Hemoglobin 12.0 - 15.0 g/dL 9.8  89.9  88.6   Hematocrit 36.0 - 46.0 % 32.2  32.3  34.3   Platelets 150 - 400 K/uL 183  192  201       CMP:     Latest Ref Rng & Units 05/19/2023    4:52 AM 05/18/2023    5:26 AM 05/16/2023    9:08 PM  CMP  Glucose 70 - 99 mg/dL 81  896  897   BUN 8 - 23 mg/dL 24  33  19   Creatinine 0.44 - 1.00 mg/dL 8.88  8.59  8.92   Sodium 135 - 145 mmol/L 139  137  139   Potassium 3.5 - 5.1 mmol/L 4.2  3.4  4.3   Chloride 98 - 111 mmol/L 107  104  103   CO2 22 - 32 mmol/L 23  24  27    Calcium  8.9 - 10.3 mg/dL 8.2  8.4  9.1   Total Protein 6.5 - 8.1 g/dL     6.3   Total Bilirubin 0.3 - 1.2 mg/dL     0.5   Alkaline Phos 38 - 126 U/L     51   AST 15 - 41 U/L     25   ALT 0 - 44 U/L     19     Per daughter her hemoglobin is much better now     Radiology Studies:  Imaging Results  CT Chest Wo Contrast Result Date: 12/12/2023 CLINICAL DATA:  78 year old female status post fall striking back of head on brick. Pain. On Plavix . EXAM: CT CHEST WITHOUT CONTRAST TECHNIQUE: Multidetector CT imaging of the chest was performed following the standard protocol without  IV contrast. RADIATION DOSE REDUCTION: This exam was performed according to the departmental dose-optimization program which includes automated exposure control, adjustment of the mA and/or kV according to patient size and/or use of iterative reconstruction technique. COMPARISON:  CTA Chest 05/17/2023. FINDINGS: Cardiovascular: Streak artifact from chronic right shoulder arthroplasty. Calcified aortic atherosclerosis. Heart size remains normal. No pericardial effusion. Vascular patency is not evaluated in the absence of IV contrast. Mediastinum/Nodes: No evidence of mediastinal hematoma, mass, lymphadenopathy on this noncontrast exam. Moderate chronic gastric hiatal hernia. Lungs/Pleura: Improved lung volumes compared to last year. Major airways are patent. Chronic subpleural right upper lobe scarring anteriorly. No  pneumothorax. No pleural effusion. No pulmonary contusion. Improved ventilation bilaterally from the prior CTA. Mild lung base scarring. Upper Abdomen: Cholecystectomy. Negative visible noncontrast liver, spleen, pancreas, adrenal glands and bowel in the upper abdomen. No upper abdominal free air or free fluid identified. Musculoskeletal: Chronic postoperative changes and surgical clips bilateral anterior chest wall. Chronic right total shoulder arthroplasty. Chronic degenerative multilevel thoracic interbody ankylosis from flowing endplate osteophytes. Chronic lower thoracic vacuum disc. Chronic left lateral 6th through 8th rib fractures. No acute rib fracture identified. IMPRESSION: 1. No acute traumatic injury identified in the noncontrast Chest. 2. Chronic left rib fractures. Moderate chronic gastric hiatal hernia. Chronic right upper lobe subpleural lung scarring. Aortic Atherosclerosis (ICD10-I70.0). Electronically Signed   By: VEAR Hurst M.D.   On: 12/12/2023 17:37    DG Shoulder Right Result Date: 12/12/2023 CLINICAL DATA:  78 year old female status post fall striking back of head on brick. Pain. On Plavix . EXAM: RIGHT SHOULDER - 2+ VIEW COMPARISON:  Chest CT today reported separately. Chest radiographs 05/20/2023 and earlier. FINDINGS: Three views at 1605 hours. Chronic right total shoulder arthroplasty. Hardware appears intact, aligned. No fracture of the right clavicle or scapula is identified. Chronic right AC joint degenerative spurring is moderate. No acute osseous abnormality identified. Right chest is detailed separately. IMPRESSION: No acute fracture or dislocation identified about the right shoulder. Chronic right total shoulder arthroplasty. Electronically Signed   By: VEAR Hurst M.D.   On: 12/12/2023 17:30    CT Cervical Spine Wo Contrast Result Date: 12/12/2023 CLINICAL DATA:  78 year old female status post fall striking back of head on brick. Pain. On Plavix . EXAM: CT CERVICAL SPINE WITHOUT  CONTRAST TECHNIQUE: Multidetector CT imaging of the cervical spine was performed without intravenous contrast. Multiplanar CT image reconstructions were also generated. RADIATION DOSE REDUCTION: This exam was performed according to the departmental dose-optimization program which includes automated exposure control, adjustment of the mA and/or kV according to patient size and/or use of iterative reconstruction technique. COMPARISON:  Prior neck CT 03/23/2016 Head and chest CT today. FINDINGS: Alignment: Straightening of cervical lordosis is stable since 2017. Cervicothoracic junction alignment is within normal limits. Bilateral posterior element alignment is within normal limits. Skull base and vertebrae: Bone mineralization is within normal limits for age. Visualized skull base is intact. No atlanto-occipital dissociation. C1 and C2 appear intact and aligned. No acute osseous abnormality identified. Soft tissues and spinal canal: No prevertebral fluid or swelling. No visible canal hematoma. Negative visible noncontrast neck soft tissues aside from partially retropharyngeal right carotid, normal variant. Disc levels: Widespread chronic cervical spine degeneration, severe disc space loss and endplate spurring R5-R4 through C6-C7, not significantly changed compared to the prior CT. Upper chest: Chest CT reported separately. Other: Severe bilateral TMJ degeneration. IMPRESSION: 1. No acute traumatic injury identified in the cervical spine. 2. Chronic cervical spine degeneration. Electronically Signed  By: VEAR Hurst M.D.   On: 12/12/2023 17:29    CT Head Wo Contrast Result Date: 12/12/2023 CLINICAL DATA:  78 year old female status post fall striking back of head on brick. Pain. On Plavix . EXAM: CT HEAD WITHOUT CONTRAST TECHNIQUE: Contiguous axial images were obtained from the base of the skull through the vertex without intravenous contrast. RADIATION DOSE REDUCTION: This exam was performed according to the  departmental dose-optimization program which includes automated exposure control, adjustment of the mA and/or kV according to patient size and/or use of iterative reconstruction technique. COMPARISON:  Brain MRI 03/24/2021. FINDINGS: Brain: Cerebral volume not significantly changed since 2022. No midline shift, ventriculomegaly, mass effect, evidence of mass lesion, intracranial hemorrhage or evidence of cortically based acute infarction. Patchy, moderate for age scattered bilateral cerebral white matter hypodensity. Otherwise maintained gray-white differentiation. Vascular: No suspicious intracranial vascular hyperdensity. Calcified atherosclerosis at the skull base. Skull: Intact.  Chronic TMJ degeneration appears severe. Sinuses/Orbits: Visualized paranasal sinuses and mastoids are clear. Other: No discrete orbit or scalp soft tissue injury identified. Chronic postoperative changes to both globes. IMPRESSION: 1. No acute intracranial abnormality or acute traumatic injury identified. 2. Moderate for age chronic white matter disease, most commonly due to small vessel ischemia. Electronically Signed   By: VEAR Hurst M.D.   On: 12/12/2023 17:18     Barium swallow films were reviewed CT chest films were reviewed with the patient patient's daughter     Anselm Bring, MD    Attending physician's note   I have taken history, reviewed the chart and examined the patient.  For EGD with dil today   Anselm Bring, MD [facility] [phone]

## 2024-02-08 NOTE — Transfer of Care (Signed)
 Immediate Anesthesia Transfer of Care Note  Patient: Tracey [name]  Procedure(s) Performed: EGD, WITH DILATION USING SAVARY-GILLIARD DILATOR OVER GUIDEWIRE  Patient Location: PACU and Endoscopy Unit  Anesthesia Type:MAC  Level of Consciousness: sedated and responds to stimulation  Airway & Oxygen  Therapy: Patient Spontanous Breathing and Patient connected to face mask oxygen   Post-op Assessment: Report given to RN and Post -op Vital signs reviewed and stable  Post vital signs: Reviewed and stable  Last Vitals:  Vitals Value Taken Time  BP 98/34 02/08/24 11:20  Temp    Pulse 67 02/08/24 11:20  Resp 18 02/08/24 11:20  SpO2 100 % 02/08/24 11:20  Vitals shown include unfiled device data.  Last Pain:  Vitals:   02/08/24 1020  TempSrc: Temporal  PainSc: 0-No pain         Complications: No notable events documented.

## 2024-02-08 NOTE — Discharge Instructions (Signed)

## 2024-02-08 NOTE — Op Note (Signed)
 [facility] Patient Name: Tracey [name] Procedure Date: 02/08/2024 MRN: [id number] Attending MD: Lynnie Bring , MD, [id number] Date of Birth: 02-05-1946 CSN: [id number] Age: 78 Admit Type: Ambulatory Procedure:                Upper GI endoscopy Indications:              Dysphagia with barium swallow showing hang up of                            barium tablet in the distal esophagus, moderate HH. Providers:                Lynnie Bring, MD, Particia Fischer, RN, Fairy Marina, Technician Referring MD:              Medicines:                Monitored Anesthesia Care Complications:            No immediate complications. Estimated Blood Loss:     Estimated blood loss: none. Procedure:                Pre-Anesthesia Assessment:                           - Prior to the procedure, a History and Physical                            was performed, and patient medications and                            allergies were reviewed. The patient's tolerance of                            previous anesthesia was also reviewed. The risks                            and benefits of the procedure and the sedation                            options and risks were discussed with the patient.                            All questions were answered, and informed consent                            was obtained. Prior Anticoagulants: The patient has                            taken Plavix  (clopidogrel ), last dose was 5 days                            prior to procedure. ASA Grade Assessment: III - A  patient with severe systemic disease. After                            reviewing the risks and benefits, the patient was                            deemed in satisfactory condition to undergo the                            procedure.                           After obtaining informed consent, the endoscope was                            passed under  direct vision. Throughout the                            procedure, the patient's blood pressure, pulse, and                            oxygen  saturations were monitored continuously. The                            [id number]-H190 ([id number]) Olympus endoscope was introduced                            through the mouth, and advanced to the second part                            of duodenum. The upper GI endoscopy was                            accomplished without difficulty. The patient                            tolerated the procedure well. Scope In: Scope Out: Findings:      The lower third of the esophagus was moderately tortuous. Biopsies were       obtained from the proximal and distal esophagus with cold forceps for       histology of suspected eosinophilic esophagitis.      A muscular Schatzki ring was found at the gastroesophageal junction, 35       cm from the incisors with luminal diameter of 10 mm. A TTS dilator was       passed through the scope. Dilation with a 12-13.5-15 mm balloon dilator       was performed to 15 mm.      A 5 cm hiatal hernia was present (extending from 35 up to 40 cm). No       Cameron erosions.      Localized moderate inflammation characterized by erosions, erythema and       friability was found in the gastric antrum. Biopsies were taken with a       cold forceps for histology.      The gastroesophageal flap valve was visualized endoscopically and  classified as Hill Grade IV (no fold, wide open lumen, hiatal hernia       present).      The examined duodenum was normal. Impression:               - Schatzki ring. Dilated.                           - Mod hiatal hernia.                           - Gastritis. Biopsied.                           - Gastroesophageal flap valve classified as Hill                            Grade IV (no fold, wide open lumen, hiatal hernia                            present).                           - Normal examined  duodenum.                           - Biopsies were taken with a cold forceps for                            evaluation of eosinophilic esophagitis. Moderate Sedation:      Not Applicable - Patient had care per Anesthesia. Recommendation:           - Patient has a contact number available for                            emergencies. The signs and symptoms of potential                            delayed complications were discussed with the                            patient. Return to normal activities tomorrow.                            Written discharge instructions were provided to the                            patient.                           - Postdilatation diet.                           - Increase Protonix  40 mg p.o. twice daily x 8                            weeks, then once a day.                           -  Resume Plavix  (clopidogrel ) at prior dose in 3                            days.                           - Await pathology results.                           - The findings and recommendations were discussed                            with the patient's family. Procedure Code(s):        --- Professional ---                           [phone], Esophagogastroduodenoscopy, flexible,                            transoral; with transendoscopic balloon dilation of                            esophagus (less than 30 mm diameter)                           43239, 59, Esophagogastroduodenoscopy, flexible,                            transoral; with biopsy, single or multiple Diagnosis Code(s):        --- Professional ---                           Q39.9, Congenital malformation of esophagus,                            unspecified                           K22.2, Esophageal obstruction                           K44.9, Diaphragmatic hernia without obstruction or                            gangrene                           K29.70, Gastritis, unspecified, without bleeding                            R13.10, Dysphagia, unspecified CPT copyright 2022 American Medical Association. All rights reserved. The codes documented in this report are preliminary and upon coder review may  be revised to meet current compliance requirements. Lynnie Bring, MD 02/08/2024 11:22:42 AM This report has been signed electronically. Number of Addenda: 0

## 2024-02-09 LAB — SURGICAL PATHOLOGY

## 2024-02-10 ENCOUNTER — Encounter (HOSPITAL_COMMUNITY): Payer: Self-pay | Admitting: Gastroenterology

## 2024-02-12 DIAGNOSIS — M5459 Other low back pain: Secondary | ICD-10-CM | POA: Diagnosis not present

## 2024-02-12 DIAGNOSIS — M5416 Radiculopathy, lumbar region: Secondary | ICD-10-CM | POA: Diagnosis not present

## 2024-02-16 DIAGNOSIS — N390 Urinary tract infection, site not specified: Secondary | ICD-10-CM | POA: Diagnosis not present

## 2024-02-16 DIAGNOSIS — Z299 Encounter for prophylactic measures, unspecified: Secondary | ICD-10-CM | POA: Diagnosis not present

## 2024-02-17 ENCOUNTER — Ambulatory Visit: Payer: Self-pay | Admitting: Gastroenterology

## 2024-02-24 ENCOUNTER — Ambulatory Visit: Admitting: Neurology

## 2024-02-24 DIAGNOSIS — E663 Overweight: Secondary | ICD-10-CM | POA: Diagnosis not present

## 2024-02-24 DIAGNOSIS — M7918 Myalgia, other site: Secondary | ICD-10-CM | POA: Diagnosis not present

## 2024-02-24 DIAGNOSIS — M353 Polymyalgia rheumatica: Secondary | ICD-10-CM | POA: Diagnosis not present

## 2024-02-24 DIAGNOSIS — Z6826 Body mass index (BMI) 26.0-26.9, adult: Secondary | ICD-10-CM | POA: Diagnosis not present

## 2024-02-24 DIAGNOSIS — M2560 Stiffness of unspecified joint, not elsewhere classified: Secondary | ICD-10-CM | POA: Diagnosis not present

## 2024-02-24 DIAGNOSIS — M0609 Rheumatoid arthritis without rheumatoid factor, multiple sites: Secondary | ICD-10-CM | POA: Diagnosis not present

## 2024-02-25 DIAGNOSIS — R350 Frequency of micturition: Secondary | ICD-10-CM | POA: Diagnosis not present

## 2024-02-25 DIAGNOSIS — R310 Gross hematuria: Secondary | ICD-10-CM | POA: Diagnosis not present

## 2024-02-25 DIAGNOSIS — R3982 Chronic bladder pain: Secondary | ICD-10-CM | POA: Diagnosis not present

## 2024-02-25 DIAGNOSIS — N3021 Other chronic cystitis with hematuria: Secondary | ICD-10-CM | POA: Diagnosis not present

## 2024-02-29 ENCOUNTER — Encounter: Payer: Self-pay | Admitting: Rheumatology

## 2024-03-01 DIAGNOSIS — M353 Polymyalgia rheumatica: Secondary | ICD-10-CM | POA: Diagnosis not present

## 2024-03-08 DIAGNOSIS — L814 Other melanin hyperpigmentation: Secondary | ICD-10-CM | POA: Diagnosis not present

## 2024-03-08 DIAGNOSIS — D2261 Melanocytic nevi of right upper limb, including shoulder: Secondary | ICD-10-CM | POA: Diagnosis not present

## 2024-03-08 DIAGNOSIS — L821 Other seborrheic keratosis: Secondary | ICD-10-CM | POA: Diagnosis not present

## 2024-03-08 DIAGNOSIS — D692 Other nonthrombocytopenic purpura: Secondary | ICD-10-CM | POA: Diagnosis not present

## 2024-03-08 DIAGNOSIS — L819 Disorder of pigmentation, unspecified: Secondary | ICD-10-CM | POA: Diagnosis not present

## 2024-03-08 DIAGNOSIS — L218 Other seborrheic dermatitis: Secondary | ICD-10-CM | POA: Diagnosis not present

## 2024-03-08 DIAGNOSIS — Z85828 Personal history of other malignant neoplasm of skin: Secondary | ICD-10-CM | POA: Diagnosis not present

## 2024-03-14 DIAGNOSIS — E663 Overweight: Secondary | ICD-10-CM | POA: Diagnosis not present

## 2024-03-14 DIAGNOSIS — M353 Polymyalgia rheumatica: Secondary | ICD-10-CM | POA: Diagnosis not present

## 2024-03-14 DIAGNOSIS — M0609 Rheumatoid arthritis without rheumatoid factor, multiple sites: Secondary | ICD-10-CM | POA: Diagnosis not present

## 2024-03-14 DIAGNOSIS — T148XXA Other injury of unspecified body region, initial encounter: Secondary | ICD-10-CM | POA: Diagnosis not present

## 2024-03-14 DIAGNOSIS — Z79899 Other long term (current) drug therapy: Secondary | ICD-10-CM | POA: Diagnosis not present

## 2024-03-14 DIAGNOSIS — Z6826 Body mass index (BMI) 26.0-26.9, adult: Secondary | ICD-10-CM | POA: Diagnosis not present

## 2024-03-20 DIAGNOSIS — H04123 Dry eye syndrome of bilateral lacrimal glands: Secondary | ICD-10-CM | POA: Diagnosis not present

## 2024-03-20 DIAGNOSIS — H524 Presbyopia: Secondary | ICD-10-CM | POA: Diagnosis not present

## 2024-03-20 DIAGNOSIS — Z961 Presence of intraocular lens: Secondary | ICD-10-CM | POA: Diagnosis not present

## 2024-03-24 DIAGNOSIS — M47816 Spondylosis without myelopathy or radiculopathy, lumbar region: Secondary | ICD-10-CM | POA: Diagnosis not present

## 2024-03-24 DIAGNOSIS — M5416 Radiculopathy, lumbar region: Secondary | ICD-10-CM | POA: Diagnosis not present

## 2024-03-27 DIAGNOSIS — N3021 Other chronic cystitis with hematuria: Secondary | ICD-10-CM | POA: Diagnosis not present

## 2024-03-30 ENCOUNTER — Telehealth: Payer: Self-pay

## 2024-03-30 ENCOUNTER — Inpatient Hospital Stay: Admitting: Adult Health

## 2024-03-30 NOTE — Telephone Encounter (Signed)
   Pre-operative Risk Assessment    Patient Name: Tracey [name]  DOB: June 07, 1946 MRN: [id number]   Date of last office visit: 04/22/23 OZELL FELL, MD Date of next office visit: NONE   Request for Surgical Clearance    Procedure:  EPIDURAL STEROID INJECTION, LUMBAR INTERIAMINAR (PROC)- L5-S1, NO CONTRAST  Date of Surgery:  Clearance TBD                                Surgeon:  DR LAQUETA Surgeon's Group or Practice Name:  [facility] Phone number:  [phone] Fax number:  [phone]   Type of Clearance Requested:   - Medical  - Pharmacy:  Hold Clopidogrel  (Plavix ) 7 DAYS BEFORE   Type of Anesthesia:  Not Indicated   Additional requests/questions:    Signed, Lucie DELENA Ku   03/30/2024, 11:12 AM

## 2024-03-30 NOTE — Telephone Encounter (Signed)
 Patient has been scheduled for televisit med rec and consent done     Patient Consent for Virtual Visit         Tracey [name] has provided verbal consent on 03/30/2024 for a virtual visit (video or telephone).   CONSENT FOR VIRTUAL VISIT FOR:  Tracey [name]  By participating in this virtual visit I agree to the following:  I hereby voluntarily request, consent and authorize [facility] and its employed or contracted physicians, physician assistants, nurse practitioners or other licensed health care professionals (the Practitioner), to provide me with telemedicine health care services (the "Services) as deemed necessary by the treating Practitioner. I acknowledge and consent to receive the Services by the Practitioner via telemedicine. I understand that the telemedicine visit will involve communicating with the Practitioner through live audiovisual communication technology and the disclosure of certain medical information by electronic transmission. I acknowledge that I have been given the opportunity to request an in-person assessment or other available alternative prior to the telemedicine visit and am voluntarily participating in the telemedicine visit.  I understand that I have the right to withhold or withdraw my consent to the use of telemedicine in the course of my care at any time, without affecting my right to future care or treatment, and that the Practitioner or I may terminate the telemedicine visit at any time. I understand that I have the right to inspect all information obtained and/or recorded in the course of the telemedicine visit and may receive copies of available information for a reasonable fee.  I understand that some of the potential risks of receiving the Services via telemedicine include:  Delay or interruption in medical evaluation due to technological equipment failure or disruption; Information transmitted may not be sufficient (e.g. poor resolution of  images) to allow for appropriate medical decision making by the Practitioner; and/or  In rare instances, security protocols could fail, causing a breach of personal health information.  Furthermore, I acknowledge that it is my responsibility to provide information about my medical history, conditions and care that is complete and accurate to the best of my ability. I acknowledge that Practitioner's advice, recommendations, and/or decision may be based on factors not within their control, such as incomplete or inaccurate data provided by me or distortions of diagnostic images or specimens that may result from electronic transmissions. I understand that the practice of medicine is not an exact science and that Practitioner makes no warranties or guarantees regarding treatment outcomes. I acknowledge that a copy of this consent can be made available to me via my patient portal [facility] MyChart), or I can request a printed copy by calling the office of [facility].    I understand that my insurance will be billed for this visit.   I have read or had this consent read to me. I understand the contents of this consent, which adequately explains the benefits and risks of the Services being provided via telemedicine.  I have been provided ample opportunity to ask questions regarding this consent and the Services and have had my questions answered to my satisfaction. I give my informed consent for the services to be provided through the use of telemedicine in my medical care

## 2024-03-30 NOTE — Telephone Encounter (Signed)
 Patient has been scheduled for TELEVISIT

## 2024-03-30 NOTE — Telephone Encounter (Signed)
   Name: Tracey [name]  DOB: Feb 06, 1946  MRN: [id number]  Primary Cardiologist: Ozell Fell, MD   Preoperative team, please contact this patient and set up a phone call appointment for further preoperative risk assessment. Please obtain consent and complete medication review. Thank you for your help.  I confirm that guidance regarding antiplatelet and oral anticoagulation therapy has been completed and, if necessary, noted below.  Per office protocol, if patient is without any new symptoms or concerns at the time of their virtual visit, she may hold Plavix  for 5 days prior to procedure (pt has previously been cleared to hold Plavix ). Please resume Plavix  as soon as possible postprocedure, at the discretion of the surgeon.    I also confirmed the patient resides in the state of [place] . As per [facility] Medical Board telemedicine laws, the patient must reside in the state in which the provider is licensed.   Damien JAYSON Braver, NP 03/30/2024, 11:28 AM [facility]

## 2024-03-31 ENCOUNTER — Inpatient Hospital Stay: Payer: Medicare Other | Admitting: Adult Health

## 2024-03-31 DIAGNOSIS — M5440 Lumbago with sciatica, unspecified side: Secondary | ICD-10-CM | POA: Diagnosis not present

## 2024-04-04 ENCOUNTER — Ambulatory Visit: Attending: Cardiology | Admitting: Emergency Medicine

## 2024-04-04 DIAGNOSIS — Z01810 Encounter for preprocedural cardiovascular examination: Secondary | ICD-10-CM | POA: Insufficient documentation

## 2024-04-04 NOTE — Progress Notes (Signed)
 Virtual Visit via Telephone Note   Because of TAMEISHA COVELL co-morbid illnesses, she is at least at moderate risk for complications without adequate follow up.  This format is felt to be most appropriate for this patient at this time.  Due to technical limitations with video connection (technology), today's appointment will be conducted as an audio only telehealth visit, and DOREATHA OFFER verbally agreed to proceed in this manner.   All issues noted in this document were discussed and addressed.  No physical exam could be performed with this format.  Evaluation Performed:  Preoperative cardiovascular risk assessment _____________   Date:  04/04/2024   Patient ID:  Denajah, Farias 08-Oct-1945, MRN [id number] Patient Location:  Home Provider location:   Office  Primary Care Provider:  Larnell Hamilton, MD Primary Cardiologist:  Ozell Fell, MD  Chief Complaint / Patient Profile   78 y.o. y/o female with a h/o coronary artery disease, hyperlipidemia, hypertension, CKD, obstructive sleep apnea, aortic atherosclerosis, breast cancer s/p double mastectomy who is pending epidural steroid injection, lumbar intralaminar, L5-S1, no contrast on date TBD with [facility] and presents today for telephonic preoperative cardiovascular risk assessment.  History of Present Illness    RENEA SCHOONMAKER is a 78 y.o. female who presents via audio/video conferencing for a telehealth visit today.  Pt was last seen in cardiology clinic on 04/22/2023 by Dr. Fell.  At that time USHA SLAGER was doing well.  The patient is now pending procedure as outlined above. Since her last visit, she denies chest pain, shortness of breath, lower extremity edema, fatigue, palpitations, melena, hematuria, hemoptysis, diaphoresis, weakness, presyncope, syncope, orthopnea, and PND.  Today patient is doing well overall.  She is without any acute cardiovascular concerns or complaints at this time.  She denies any anginal or  exertional symptoms.  She does stay relatively active which is limited by some chronic pain.  She does walk her dog daily and goes to PT without any symptoms.  Overall she is able to complete greater than 4 METS.  Past Medical History    Past Medical History:  Diagnosis Date   Allergy    Arthritis    Breast cancer (HCC) 2014   ER+/PR+/HEr2-,    Bruises easily    Colon polyps    2 polyps by report   Coronary artery disease involving native coronary artery of native heart without angina pectoris 01/01/2014   NSTEMI in January 2019 in [place] >> s/p DES x2 to the LAD (procedure complicated by stent thrombosis) Myoview  05/2020: EF 72, no infarct or ischemia; low risk Echocardiogram 05/2019: EF 60-65, GLS -21.5 Cardiac catheterization at [facility] in 08/2017: Normal left main, normal LCx, normal RCA; LAD/diagonal 95%>> PCI    Diverticulosis    GERD (gastroesophageal reflux disease)    occasionally takes Nexium     Gout    History of bladder infections    many yrs ago   History of bronchitis    last time at least 11yrs ago   History of hiatal hernia    History of stress incontinence    Hyperlipidemia    takes Pravastatin  daily   Hypertension    Insomnia    takes Ambien nightly prn   NSTEMI (non-ST elevated myocardial infarction) (HCC) 08/2017   OSA (obstructive sleep apnea)    on CPAP   Osteoarthritis    Peripheral edema    takes Furosemide  daily prn   Pneumonia    PONV (postoperative nausea  and vomiting)    Postmenopausal hormone therapy    Radiation 07/27/13-09/07/13   Right Breast x 31 treatments   Rhinitis    uses Flonase  prn   Sinus congestion    Status post breast reconstruction 10/15/2014   Bilateral implant removal and DIEP performed in [place], [place]   Tachycardia    takes Metoprolol  daily   Ventricular tachycardia, non-sustained (HCC)    during sleep study 2009 with normal cardiac workup   Past Surgical History:  Procedure Laterality Date    ABDOMINAL HYSTERECTOMY     with BSO   APPENDECTOMY     ARTERY BIOPSY Left 05/15/2022   Procedure: BIOPSY TEMPORAL ARTERY;  Surgeon: Gretta Lonni PARAS, MD;  Location: [facility] OR;  Service: Vascular;  Laterality: Left;   AXILLARY SENTINEL NODE BIOPSY Right 05/23/2013   Procedure: AXILLARY SENTINEL NODE BIOPSY;  Surgeon: Krystal PARAS Russell, MD;  Location: [facility] OR;  Service: General;  Laterality: Right;  nuc med injection 7:00   BALLOON DILATION N/A 02/08/2024   Procedure: MERRILL HODGKIN;  Surgeon: Charlanne Groom, MD;  Location: [facility];  Service: Gastroenterology;  Laterality: N/A;   BREAST RECONSTRUCTION WITH PLACEMENT OF TISSUE EXPANDER AND FLEX HD (ACELLULAR HYDRATED DERMIS) Bilateral 05/23/2013   Procedure: BILATERAL BREAST RECONSTRUCTION WITH PLACEMENT OF TISSUE EXPANDER AND FLEX HD;  Surgeon: Alm Sick, MD;  Location: [facility] OR;  Service: Plastics;  Laterality: Bilateral;   CARDIAC CATHETERIZATION  1999   CATARACT EXTRACTION, BILATERAL Bilateral    CHOLECYSTECTOMY     COSMETIC SURGERY     ESOPHAGOGASTRODUODENOSCOPY N/A 02/08/2024   Procedure: EGD (ESOPHAGOGASTRODUODENOSCOPY);  Surgeon: Charlanne Groom, MD;  Location: [facility];  Service: Gastroenterology;  Laterality: N/A;   EXPLORATORY LAPAROTOMY     OTHER SURGICAL HISTORY  09/2017   had cyst removal from spine    PERCUTANEOUS CORONARY STENT INTERVENTION (PCI-S)  08/2017   done in [place]      REMOVAL OF BILATERAL TISSUE EXPANDERS WITH PLACEMENT OF BILATERAL BREAST IMPLANTS Bilateral 05/17/2014   Procedure: REMOVAL OF BILATERAL TISSUE EXPANDERS WITH PLACEMENT OF BILATERAL BREAST IMPLANTS FOR RECONSTRUCTION;  Surgeon: Alm Sick, MD;  Location: [facility] OR;  Service: Plastics;  Laterality: Bilateral;   REVERSE SHOULDER ARTHROPLASTY Right 05/13/2023   Procedure: REVERSE SHOULDER ARTHROPLASTY;  Surgeon: Melita Drivers, MD;  Location: [facility]S;  Service: Orthopedics;  Laterality: Right;   TONSILLECTOMY     TOTAL KNEE ARTHROPLASTY Right 07/26/2018    Procedure: TOTAL KNEE ARTHROPLASTY;  Surgeon: Ernie Cough, MD;  Location: [facility];  Service: Orthopedics;  Laterality: Right;  70 mins   TOTAL KNEE ARTHROPLASTY Left 12/02/2021   Procedure: TOTAL KNEE ARTHROPLASTY;  Surgeon: Ernie Cough, MD;  Location: [facility];  Service: Orthopedics;  Laterality: Left;   TOTAL MASTECTOMY Bilateral 05/23/2013   Procedure: TOTAL MASTECTOMY;  Surgeon: Krystal PARAS Russell, MD;  Location: [facility] OR;  Service: General;  Laterality: Bilateral;    Allergies  Allergies  Allergen Reactions   Iodine  Hives and Swelling    Other Reaction(s): Unknown   Pseudoeph-Hydrocodone -Gg Other (See Comments)    hallucinations   Ivp Dye [Iodinated Contrast Media] Hives   Methocarbamol  Other (See Comments)    NO MUSCLE RELAXER'S  Hallucinations   Oxycodone  Other (See Comments)    hallucinations   Shellfish-Derived Products     Other Reaction(s): Unknown   Sulfa Antibiotics Swelling   Tape Other (See Comments)     tears skin ; ok to use paper tape     Home Medications    Prior to Admission  medications   Medication Sig Start Date End Date Taking? Authorizing Provider  allopurinol  (ZYLOPRIM ) 100 MG tablet TAKE 1 TABLET BY MOUTH ONCE DAILY 06/25/16   Magrinat, Sandria BROCKS, MD  Budeson-Glycopyrrol-Formoterol  (BREZTRI  AEROSPHERE) 160-9-4.8 MCG/ACT AERO Inhale 2 puffs into the lungs in the morning and at bedtime. Patient taking differently: Inhale 2 puffs into the lungs as needed (wheezing/SOB). 06/19/22   Darlean Ozell NOVAK, MD  Cholecalciferol  (VITAMIN D3) 50 MCG (2000 UT) TABS Take 2,000 Units by mouth daily.    [provider]  clopidogrel  (PLAVIX ) 75 MG tablet TAKE 1 TABLET(75 MG) BY MOUTH DAILY 07/08/23   Wonda Ozell, MD  cyanocobalamin  (,VITAMIN B-12,) 1000 MCG/ML injection Inject 1,000 mcg into the muscle every 30 (thirty) days.    [provider]  estradiol (ESTRACE) 0.1 MG/GM vaginal cream USE 1 GIVE INTO THE VAGINA EVERY NIGHT AT BEDTIME FOR 14 DAYS THEN  TWICE A WEEK THEREAFTER    [provider]  fluticasone  (FLONASE ) 50 MCG/ACT nasal spray Place 2 sprays into both nostrils daily. 01/18/13   [provider]  folic acid  (FOLVITE ) 1 MG tablet Take 1 mg by mouth daily.    [provider]  furosemide  (LASIX ) 40 MG tablet Take 40 mg by mouth daily as needed for fluid.  04/01/13   [provider]  Hydroxychloroquine  Sulfate 300 MG TABS Take 1 tablet by mouth daily.    [provider]  ketoconazole  (NIZORAL ) 2 % shampoo Apply 1 Application topically as needed. 08/24/23   [provider]  lidocaine  (LIDODERM ) 5 % Place 1 patch onto the skin daily. Remove & Discard patch within 12 hours or as directed by MD 12/25/23   Neysa Caron PARAS, DO  loratadine  (CLARITIN ) 10 MG tablet Take 10 mg by mouth daily.    [provider]  metoprolol  succinate (TOPROL -XL) 25 MG 24 hr tablet TAKE 1 TABLET(25 MG) BY MOUTH DAILY 12/10/23   Cooper, Michael, MD  Multiple Vitamin (MULTI-VITAMIN) tablet Take 1 tablet by mouth daily.    [provider]  MYRBETRIQ 50 MG TB24 tablet Take 1 tablet by mouth daily. Hold for 7 days , 01-03-2024.    [provider]  nitroGLYCERIN  (NITROSTAT ) 0.4 MG SL tablet Place 1 tablet (0.4 mg total) under the tongue every 5 (five) minutes as needed for chest pain. 09/29/23   Wonda Ozell, MD  ORENCIA CLICKJECT 125 MG/ML SOAJ Inject 125 mg into the skin once a week. 12/13/23   [provider]  pantoprazole  (PROTONIX ) 40 MG tablet Take 1 tablet (40 mg total) by mouth 2 (two) times daily. 12/17/23   Charlanne Groom, MD  polyethylene glycol (MIRALAX  / GLYCOLAX ) 17 g packet Take 17 g by mouth daily as needed for mild constipation. 12/03/21   Patti Rosina SAUNDERS, PA-C  predniSONE  (DELTASONE ) 5 MG tablet Take 5 mg by mouth daily with breakfast.    [provider]  Propylene Glycol (SYSTANE COMPLETE) 0.6 % SOLN Place 2 drops into both eyes 2 (two) times daily.    [provider]  rosuvastatin  (CRESTOR ) 20 MG tablet Take 1 tablet (20 mg total) by mouth daily. 12/10/23   Wonda Ozell, MD  sertraline  (ZOLOFT ) 100 MG tablet Take 0.5 tablets (50 mg total) by mouth daily. 12/27/23   Dohmeier, Dedra, MD  testosterone  cypionate (DEPOTESTOSTERONE CYPIONATE) 200 MG/ML injection Inject 200 mg into the muscle every 30 (thirty) days. 01/17/19   [provider]  traMADol  (ULTRAM ) 50 MG tablet Take 100 mg by mouth  2 (two) times daily.    [provider]    Physical Exam    Vital Signs:  AKEIA PEROT does not have vital signs available for review today.  Given telephonic nature of communication, physical exam is limited. AAOx3. NAD. Normal affect.  Speech and respirations are unlabored.  Accessory Clinical Findings    None  Assessment & Plan    1.  Preoperative Cardiovascular Risk Assessment: According to the Revised Cardiac Risk Index (RCRI), her Perioperative Risk of Major Cardiac Event is (%): 0.9. Her Functional Capacity in METs is: 5.62 according to the Duke Activity Status Index (DASI). Therefore, based on ACC/AHA guidelines, patient would be at acceptable risk for the planned procedure without further cardiovascular testing.  The patient was advised that if she develops new symptoms prior to surgery to contact our office to arrange for a follow-up visit, and she verbalized understanding.  She may hold Plavix  for 5 days prior to procedure (pt has previously been cleared to hold Plavix ). Please resume Plavix  as soon as possible postprocedure, at the discretion of the surgeon.   A copy of this note will be routed to requesting surgeon.  Time:   Today, I have spent 7 minutes with the patient with telehealth technology discussing medical history, symptoms, and management plan.     Lum LITTIE Louis, NP  04/04/2024, 10:10 AM

## 2024-04-05 DIAGNOSIS — D649 Anemia, unspecified: Secondary | ICD-10-CM | POA: Diagnosis not present

## 2024-04-05 DIAGNOSIS — I129 Hypertensive chronic kidney disease with stage 1 through stage 4 chronic kidney disease, or unspecified chronic kidney disease: Secondary | ICD-10-CM | POA: Diagnosis not present

## 2024-04-05 DIAGNOSIS — E538 Deficiency of other specified B group vitamins: Secondary | ICD-10-CM | POA: Diagnosis not present

## 2024-04-05 DIAGNOSIS — M109 Gout, unspecified: Secondary | ICD-10-CM | POA: Diagnosis not present

## 2024-04-05 DIAGNOSIS — N1832 Chronic kidney disease, stage 3b: Secondary | ICD-10-CM | POA: Diagnosis not present

## 2024-04-05 DIAGNOSIS — E785 Hyperlipidemia, unspecified: Secondary | ICD-10-CM | POA: Diagnosis not present

## 2024-04-06 DIAGNOSIS — M5416 Radiculopathy, lumbar region: Secondary | ICD-10-CM | POA: Diagnosis not present

## 2024-04-10 ENCOUNTER — Encounter: Payer: Self-pay | Admitting: Adult Health

## 2024-04-10 ENCOUNTER — Inpatient Hospital Stay: Attending: Adult Health | Admitting: Adult Health

## 2024-04-10 VITALS — BP 105/61 | HR 61 | Temp 97.4°F | Resp 18 | Ht 62.0 in | Wt 146.1 lb

## 2024-04-10 DIAGNOSIS — Z87891 Personal history of nicotine dependence: Secondary | ICD-10-CM | POA: Insufficient documentation

## 2024-04-10 DIAGNOSIS — Z8349 Family history of other endocrine, nutritional and metabolic diseases: Secondary | ICD-10-CM | POA: Diagnosis not present

## 2024-04-10 DIAGNOSIS — N1832 Chronic kidney disease, stage 3b: Secondary | ICD-10-CM | POA: Diagnosis not present

## 2024-04-10 DIAGNOSIS — Z1721 Progesterone receptor positive status: Secondary | ICD-10-CM | POA: Insufficient documentation

## 2024-04-10 DIAGNOSIS — Z9013 Acquired absence of bilateral breasts and nipples: Secondary | ICD-10-CM | POA: Insufficient documentation

## 2024-04-10 DIAGNOSIS — C50411 Malignant neoplasm of upper-outer quadrant of right female breast: Secondary | ICD-10-CM | POA: Insufficient documentation

## 2024-04-10 DIAGNOSIS — Z170 Estrogen receptor positive status [ER+]: Secondary | ICD-10-CM | POA: Insufficient documentation

## 2024-04-10 DIAGNOSIS — Z9049 Acquired absence of other specified parts of digestive tract: Secondary | ICD-10-CM | POA: Diagnosis not present

## 2024-04-10 DIAGNOSIS — Z90722 Acquired absence of ovaries, bilateral: Secondary | ICD-10-CM | POA: Diagnosis not present

## 2024-04-10 DIAGNOSIS — Z90710 Acquired absence of both cervix and uterus: Secondary | ICD-10-CM | POA: Insufficient documentation

## 2024-04-10 DIAGNOSIS — Z9981 Dependence on supplemental oxygen: Secondary | ICD-10-CM | POA: Diagnosis not present

## 2024-04-10 DIAGNOSIS — Z860100 Personal history of colon polyps, unspecified: Secondary | ICD-10-CM | POA: Diagnosis not present

## 2024-04-10 DIAGNOSIS — G4733 Obstructive sleep apnea (adult) (pediatric): Secondary | ICD-10-CM | POA: Insufficient documentation

## 2024-04-10 DIAGNOSIS — M353 Polymyalgia rheumatica: Secondary | ICD-10-CM | POA: Diagnosis not present

## 2024-04-10 DIAGNOSIS — Z923 Personal history of irradiation: Secondary | ICD-10-CM | POA: Diagnosis not present

## 2024-04-10 DIAGNOSIS — Z79899 Other long term (current) drug therapy: Secondary | ICD-10-CM | POA: Diagnosis not present

## 2024-04-10 DIAGNOSIS — Z8249 Family history of ischemic heart disease and other diseases of the circulatory system: Secondary | ICD-10-CM | POA: Insufficient documentation

## 2024-04-10 DIAGNOSIS — I252 Old myocardial infarction: Secondary | ICD-10-CM | POA: Insufficient documentation

## 2024-04-10 DIAGNOSIS — M5416 Radiculopathy, lumbar region: Secondary | ICD-10-CM | POA: Diagnosis not present

## 2024-04-10 DIAGNOSIS — I129 Hypertensive chronic kidney disease with stage 1 through stage 4 chronic kidney disease, or unspecified chronic kidney disease: Secondary | ICD-10-CM | POA: Insufficient documentation

## 2024-04-10 DIAGNOSIS — Z803 Family history of malignant neoplasm of breast: Secondary | ICD-10-CM | POA: Diagnosis not present

## 2024-04-10 NOTE — Progress Notes (Signed)
 [facility] Cancer Follow up:    Tracey Hamilton, MD [address] [place] [address]   DIAGNOSIS: Cancer Staging  Breast cancer of upper-outer quadrant of right female breast [facility]) Staging form: Breast, AJCC 7th Edition - Clinical: Stage IA (T1a, N0, cM0) - Unsigned Specimen type: Core Needle Biopsy Histopathologic type: 9931 Laterality: Right - Pathologic: No stage assigned - Unsigned Specimen type: Core Needle Biopsy Histopathologic type: 9931 Laterality: Right    SUMMARY OF ONCOLOGIC HISTORY: Oncology History   No history exists.    CURRENT THERAPY:  INTERVAL HISTORY:  Discussed the use of AI scribe software for clinical note transcription with the patient, who gave verbal consent to proceed.  Tracey [name] 78 y.o. female returns for    Patient Active Problem List   Diagnosis Date Noted  . Schatzki ring of distal esophagus 02/08/2024  . Esophageal dysphagia 02/08/2024  . Myalgia, multiple sites 12/27/2023  . Polymyalgia rheumatica syndrome (HCC) 12/27/2023  . Age-related osteoporosis without current pathological fracture 07/20/2023  . CAP (community acquired pneumonia) 05/18/2023  . Hypokalemia 05/18/2023  . Anemia 05/18/2023  . Acute hypoxemic respiratory failure (HCC) 05/17/2023  . S/P reverse total shoulder arthroplasty, right 05/13/2023  . Fever of unknown origin 06/10/2022  . Stage 3b chronic kidney disease (HCC) 06/10/2022  . ARF (acute renal failure) (HCC) 12/30/2021  . DOE (dyspnea on exertion) 12/29/2021  . S/P total knee arthroplasty, left 12/02/2021  . Preoperative cardiovascular examination 11/18/2021  . REM behavioral disorder 06/12/2021  . Supplemental oxygen  dependent 01/20/2021  . 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  . CPAP (continuous positive airway pressure) dependence 01/20/2021  . Unsteady gait when walking 01/20/2021  . Complaint related to dreams 06/05/2019  . Metabolic acidosis with increased anion gap and  accumulation of organic acids 08/17/2018  . Nausea, vomiting, and diarrhea 08/17/2018  . AKI (acute kidney injury) (HCC) 08/17/2018  . Overweight (BMI 25.0-29.9) 07/27/2018  . 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/27/2018  . S/P right TKA 07/26/2018  . S/P knee replacement 07/26/2018  . UARS (upper airway resistance syndrome) 05/30/2018  . Hyperlipidemia LDL goal <70 05/02/2018  . Leg injury, right, initial encounter 01/06/2017  . Contusion of right lower leg 01/06/2017  . Hypoxemia 12/23/2015  . Statin myopathy 05/20/2015  . MCI (mild cognitive impairment) 05/20/2015  . Edema 05/17/2015  . 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 Breast cancer genetic susceptibility 04/02/2015  . OSA on CPAP 04/02/2015  . Coronary artery disease involving native coronary artery with angina pectoris (HCC) 01/01/2014  . Chest pain 01/01/2014  . Essential hypertension   . Ventricular tachycardia, non-sustained (HCC)   . Breast cancer of upper-outer quadrant of right female breast (HCC) 06/28/2013  . Postoperative visit 06/02/2013  . Gout   . Tachycardia     is allergic to iodine , pseudoeph-hydrocodone -gg, ivp dye [iodinated contrast media], methocarbamol , oxycodone , shellfish-derived products, sulfa antibiotics, and tape.  MEDICAL HISTORY: Past Medical History:  Diagnosis Date  . Allergy   . Arthritis   . Breast cancer (HCC) 2014   ER+/PR+/HEr2-,   . Bruises easily   . Colon polyps    2 polyps by report  . Coronary artery disease involving native coronary artery of native heart without angina pectoris 01/01/2014   NSTEMI in January 2019 in [place] >> s/p DES x2 to the LAD (procedure complicated by stent thrombosis) Myoview  05/2020: EF 72, no infarct or ischemia; low risk Echocardiogram 05/2019: EF 60-65, GLS -21.5 Cardiac catheterization at [facility] in 08/2017: Normal left main, normal LCx, normal RCA; LAD/diagonal 95%>> PCI   . Diverticulosis   . GERD  (gastroesophageal reflux disease)    occasionally takes Nexium    . Gout   . History of bladder infections    many yrs ago  . History of bronchitis    last time at least 42yrs ago  . History of hiatal hernia   . History of stress incontinence   . Hyperlipidemia    takes Pravastatin  daily  . Hypertension   . Insomnia    takes Ambien nightly prn  . NSTEMI (non-ST elevated myocardial infarction) (HCC) 08/2017  . OSA (obstructive sleep apnea)    on CPAP  . Osteoarthritis   . Peripheral edema    takes Furosemide  daily prn  . Pneumonia   . PONV (postoperative nausea and vomiting)   . Postmenopausal hormone therapy   . Radiation 07/27/13-09/07/13   Right Breast x 31 treatments  . Rhinitis    uses Flonase  prn  . Sinus congestion   . Status post breast reconstruction 10/15/2014   Bilateral implant removal and DIEP performed in [place]  . Tachycardia    takes Metoprolol  daily  . Ventricular tachycardia, non-sustained (HCC)    during sleep study 2009 with normal cardiac workup    SURGICAL HISTORY: Past Surgical History:  Procedure Laterality Date  . ABDOMINAL HYSTERECTOMY     with BSO  . APPENDECTOMY    . ARTERY BIOPSY Left 05/15/2022   Procedure: BIOPSY TEMPORAL ARTERY;  Surgeon: Gretta Lonni PARAS, MD;  Location[facility] OR;  Service: Vascular;  Laterality: Left;  . AXILLARY SENTINEL NODE BIOPSY Right 05/23/2013   Procedure: AXILLARY SENTINEL NODE BIOPSY;  Surgeon: Krystal PARAS Russell, MD;  Location[facility] OR;  Service: General;  Laterality: Right;  nuc med injection 7:00  . BALLOON DILATION N/A 02/08/2024   Procedure: BALLOON DILATION;  Surgeon: Charlanne Groom, MD;  Location: [facility];  Service: Gastroenterology;  Laterality: N/A;  . BREAST RECONSTRUCTION WITH PLACEMENT OF TISSUE EXPANDER AND FLEX HD (ACELLULAR HYDRATED DERMIS) Bilateral 05/23/2013   Procedure: BILATERAL BREAST RECONSTRUCTION WITH PLACEMENT OF TISSUE EXPANDER AND FLEX HD;  Surgeon: Alm Sick, MD;  Location: [facility]  OR;  Service: Plastics;  Laterality: Bilateral;  . CARDIAC CATHETERIZATION  1999  . CATARACT EXTRACTION, BILATERAL Bilateral   . CHOLECYSTECTOMY    . COSMETIC SURGERY    . ESOPHAGOGASTRODUODENOSCOPY N/A 02/08/2024   Procedure: EGD (ESOPHAGOGASTRODUODENOSCOPY);  Surgeon: Charlanne Groom, MD;  Location: [facility];  Service: Gastroenterology;  Laterality: N/A;  . EXPLORATORY LAPAROTOMY    . OTHER SURGICAL HISTORY  09/2017   had cyst removal from spine   . PERCUTANEOUS CORONARY STENT INTERVENTION (PCI-S)  08/2017   done in [place]     . REMOVAL OF BILATERAL TISSUE EXPANDERS WITH PLACEMENT OF BILATERAL BREAST IMPLANTS Bilateral 05/17/2014   Procedure: REMOVAL OF BILATERAL TISSUE EXPANDERS WITH PLACEMENT OF BILATERAL BREAST IMPLANTS FOR RECONSTRUCTION;  Surgeon: Alm Sick, MD;  Location: [facility] OR;  Service: Plastics;  Laterality: Bilateral;  . REVERSE SHOULDER ARTHROPLASTY Right 05/13/2023   Procedure: REVERSE SHOULDER ARTHROPLASTY;  Surgeon: Melita Drivers, MD;  Location: [facility] ORS;  Service: Orthopedics;  Laterality: Right;  . TONSILLECTOMY    . TOTAL KNEE ARTHROPLASTY Right 07/26/2018   Procedure: TOTAL KNEE ARTHROPLASTY;  Surgeon: Ernie Cough, MD;  Location: [facility] ORS;  Service: Orthopedics;  Laterality: Right;  70 mins  . TOTAL KNEE ARTHROPLASTY Left 12/02/2021   Procedure: TOTAL KNEE ARTHROPLASTY;  Surgeon: Ernie Cough, MD;  Location: [facility] OR[facility];  Service: Orthopedics;  Laterality: Left;  . TOTAL MASTECTOMY Bilateral 05/23/2013   Procedure: TOTAL MASTECTOMY;  Surgeon: Krystal JINNY Russell, MD;  Location: [facility] OR;  Service: General;  Laterality: Bilateral;    SOCIAL HISTORY: Social History   Socioeconomic History  . Marital status: Married    Spouse name: Not on file  . Number of children: 2  . Years of education: Not on file  . Highest education level: Not on file  Occupational History  . Occupation: retired  Tobacco Use  . Smoking status: Former    Current packs/day: 1.00    Average  packs/day: 1 pack/day for 31.8 years (31.8 ttl pk-yrs)    Types: Cigarettes    Start date: 06/09/1992  . Smokeless tobacco: Never  . Tobacco comments:    quit at age 36  Vaping Use  . Vaping status: Never Used  Substance and Sexual Activity  . Alcohol  use: Yes    Alcohol /week: 1.0 standard drink of alcohol     Types: 1 Standard drinks or equivalent per week    Comment: 1 a day  . Drug use: Never  . Sexual activity: Yes    Birth control/protection: Surgical  Other Topics Concern  . Not on file  Social History Narrative  . Not on file   Social Drivers of Health   Financial Resource Strain: Not on file  Food Insecurity: Patient Declined (05/17/2023)   Hunger Vital Sign   . Worried About Programme researcher, broadcasting/film/video in the Last Year: Patient declined   . Ran Out of Food in the Last Year: Patient declined  Transportation Needs: Patient Declined (05/17/2023)   PRAPARE - Transportation   . Lack of Transportation (Medical): Patient declined   . Lack of Transportation (Non-Medical): Patient declined  Physical Activity: Not on file  Stress: Not on file  Social Connections: Not on file  Intimate Partner Violence: Patient Declined (05/17/2023)   Humiliation, Afraid, Rape, and Kick questionnaire   . Fear of Current or Ex-Partner: Patient declined   . Emotionally Abused: Patient declined   . Physically Abused: Patient declined   . Sexually Abused: Patient declined    FAMILY HISTORY: Family History  Problem Relation Age of Onset  . Breast cancer Mother        possible inflammatory breast cancer  . Heart attack Father   . Heart disease Father   . ALS Sister   . Breast cancer Maternal Grandmother 6  . Heart attack Maternal Grandfather   . Other Paternal Grandmother        ruptured appendix  . Heart attack Paternal Grandfather   . Breast cancer Maternal Aunt        dx in her 16s  . Breast cancer Other        maternal great grandmother; dx in her 42s  . Thyroid  disease Daughter      Review of Systems - Oncology    PHYSICAL EXAMINATION    Vitals:   04/10/24 1212  BP: 105/61  Pulse: 61  Resp: 18  Temp: (!) 97.4 F (36.3 C)  SpO2: 100%    Physical Exam  LABORATORY DATA:  CBC    Component Value Date/Time   WBC 7.5 05/19/2023 0452   RBC 3.31 (L) 05/19/2023 0452   HGB 9.8 (L) 05/19/2023 0452   HGB 14.0 03/28/2020 1443   HGB 13.5 01/06/2017 1316   HCT 32.2 (L) 05/19/2023 0452   HCT 41.4 01/06/2017 1316   PLT 183 05/19/2023 0452  PLT 207 03/28/2020 1443   PLT 162 01/06/2017 1316   MCV 97.3 05/19/2023 0452   MCV 93.7 01/06/2017 1316   MCH 29.6 05/19/2023 0452   MCHC 30.4 05/19/2023 0452   RDW 13.6 05/19/2023 0452   RDW 14.0 01/06/2017 1316   LYMPHSABS 1.6 05/19/2023 0452   LYMPHSABS 1.7 01/06/2017 1316   MONOABS 0.8 05/19/2023 0452   MONOABS 0.4 01/06/2017 1316   EOSABS 0.2 05/19/2023 0452   EOSABS 0.1 01/06/2017 1316   BASOSABS 0.0 05/19/2023 0452   BASOSABS 0.0 01/06/2017 1316    CMP     Component Value Date/Time   NA 139 05/19/2023 0452   NA 143 01/20/2018 0952   NA 141 01/06/2017 1316   K 4.2 05/19/2023 0452   K 4.4 01/06/2017 1316   CL 107 05/19/2023 0452   CO2 23 05/19/2023 0452   CO2 23 01/06/2017 1316   GLUCOSE 81 05/19/2023 0452   GLUCOSE 84 01/06/2017 1316   BUN 24 (H) 05/19/2023 0452   BUN 18 01/20/2018 0952   BUN 23.3 01/06/2017 1316   CREATININE 1.11 (H) 05/19/2023 0452   CREATININE 1.27 (H) 01/28/2022 1515   CREATININE 0.9 01/06/2017 1316   CALCIUM  8.2 (L) 05/19/2023 0452   CALCIUM  10.0 01/06/2017 1316   PROT 6.3 (L) 05/16/2023 2108   PROT 6.6 05/22/2019 0756   PROT 7.2 01/06/2017 1316   ALBUMIN 3.8 05/16/2023 2108   ALBUMIN 4.5 05/22/2019 0756   ALBUMIN 4.3 01/06/2017 1316   AST 25 05/16/2023 2108   AST 26 03/28/2020 1443   AST 27 01/06/2017 1316   ALT 19 05/16/2023 2108   ALT 24 03/28/2020 1443   ALT 40 01/06/2017 1316   ALKPHOS 51 05/16/2023 2108   ALKPHOS 85 01/06/2017 1316   BILITOT 0.5  05/16/2023 2108   BILITOT 0.4 03/28/2020 1443   BILITOT 0.47 01/06/2017 1316   GFRNONAA 51 (L) 05/19/2023 0452   GFRNONAA 39 (L) 03/28/2020 1443   GFRAA 45 (L) 03/28/2020 1443     ASSESSMENT and THERAPY PLAN:   No problem-specific Assessment & Plan notes found for this encounter.     All questions were answered. The patient knows to call the clinic with any problems, questions or concerns. We can certainly see the patient much sooner if necessary.  Total encounter time:*** minutes*in face-to-face visit time, chart review, lab review, care coordination, order entry, and documentation of the encounter time.    Morna Kendall, NP 04/10/24 12:13 PM Medical Oncology and Hematology [facility] [address] [place], [place] [address] Tel. [phone]    Fax. [phone]  *Total Encounter Time as defined by the Centers for Medicare and Medicaid Services includes, in addition to the face-to-face time of a patient visit (documented in the note above) non-face-to-face time: obtaining and reviewing outside history, ordering and reviewing medications, tests or procedures, care coordination (communications with other health care professionals or caregivers) and documentation in the medical record.

## 2024-04-11 ENCOUNTER — Encounter: Payer: Self-pay | Admitting: Rheumatology

## 2024-04-12 DIAGNOSIS — M0609 Rheumatoid arthritis without rheumatoid factor, multiple sites: Secondary | ICD-10-CM | POA: Diagnosis not present

## 2024-04-12 DIAGNOSIS — M5416 Radiculopathy, lumbar region: Secondary | ICD-10-CM | POA: Diagnosis not present

## 2024-04-12 DIAGNOSIS — K219 Gastro-esophageal reflux disease without esophagitis: Secondary | ICD-10-CM | POA: Diagnosis not present

## 2024-04-12 DIAGNOSIS — N1832 Chronic kidney disease, stage 3b: Secondary | ICD-10-CM | POA: Diagnosis not present

## 2024-04-12 DIAGNOSIS — R0781 Pleurodynia: Secondary | ICD-10-CM | POA: Diagnosis not present

## 2024-04-12 DIAGNOSIS — I129 Hypertensive chronic kidney disease with stage 1 through stage 4 chronic kidney disease, or unspecified chronic kidney disease: Secondary | ICD-10-CM | POA: Diagnosis not present

## 2024-04-12 DIAGNOSIS — M109 Gout, unspecified: Secondary | ICD-10-CM | POA: Diagnosis not present

## 2024-04-12 DIAGNOSIS — Z1339 Encounter for screening examination for other mental health and behavioral disorders: Secondary | ICD-10-CM | POA: Diagnosis not present

## 2024-04-12 DIAGNOSIS — Z0000 Encounter for general adult medical examination without abnormal findings: Secondary | ICD-10-CM | POA: Diagnosis not present

## 2024-04-12 DIAGNOSIS — Z23 Encounter for immunization: Secondary | ICD-10-CM | POA: Diagnosis not present

## 2024-04-12 DIAGNOSIS — D84821 Immunodeficiency due to drugs: Secondary | ICD-10-CM | POA: Diagnosis not present

## 2024-04-12 DIAGNOSIS — R1319 Other dysphagia: Secondary | ICD-10-CM | POA: Diagnosis not present

## 2024-04-12 DIAGNOSIS — M353 Polymyalgia rheumatica: Secondary | ICD-10-CM | POA: Diagnosis not present

## 2024-04-12 DIAGNOSIS — I2510 Atherosclerotic heart disease of native coronary artery without angina pectoris: Secondary | ICD-10-CM | POA: Diagnosis not present

## 2024-04-12 DIAGNOSIS — E538 Deficiency of other specified B group vitamins: Secondary | ICD-10-CM | POA: Diagnosis not present

## 2024-04-12 DIAGNOSIS — R82998 Other abnormal findings in urine: Secondary | ICD-10-CM | POA: Diagnosis not present

## 2024-04-12 DIAGNOSIS — Z1331 Encounter for screening for depression: Secondary | ICD-10-CM | POA: Diagnosis not present

## 2024-04-12 DIAGNOSIS — Z853 Personal history of malignant neoplasm of breast: Secondary | ICD-10-CM | POA: Diagnosis not present

## 2024-04-14 DIAGNOSIS — M5416 Radiculopathy, lumbar region: Secondary | ICD-10-CM | POA: Diagnosis not present

## 2024-04-18 DIAGNOSIS — M5440 Lumbago with sciatica, unspecified side: Secondary | ICD-10-CM | POA: Diagnosis not present

## 2024-04-21 DIAGNOSIS — M5416 Radiculopathy, lumbar region: Secondary | ICD-10-CM | POA: Diagnosis not present

## 2024-04-21 DIAGNOSIS — M5440 Lumbago with sciatica, unspecified side: Secondary | ICD-10-CM | POA: Diagnosis not present

## 2024-04-24 DIAGNOSIS — M5416 Radiculopathy, lumbar region: Secondary | ICD-10-CM | POA: Diagnosis not present

## 2024-04-24 DIAGNOSIS — M5440 Lumbago with sciatica, unspecified side: Secondary | ICD-10-CM | POA: Diagnosis not present

## 2024-05-01 DIAGNOSIS — M5440 Lumbago with sciatica, unspecified side: Secondary | ICD-10-CM | POA: Diagnosis not present

## 2024-05-01 DIAGNOSIS — M47816 Spondylosis without myelopathy or radiculopathy, lumbar region: Secondary | ICD-10-CM | POA: Diagnosis not present

## 2024-05-01 DIAGNOSIS — M5416 Radiculopathy, lumbar region: Secondary | ICD-10-CM | POA: Diagnosis not present

## 2024-05-11 DIAGNOSIS — M5440 Lumbago with sciatica, unspecified side: Secondary | ICD-10-CM | POA: Diagnosis not present

## 2024-05-15 ENCOUNTER — Encounter (HOSPITAL_COMMUNITY): Payer: Self-pay

## 2024-05-15 ENCOUNTER — Observation Stay (HOSPITAL_COMMUNITY)
Admission: EM | Admit: 2024-05-15 | Discharge: 2024-05-18 | Disposition: A | Discharge status: Home / Self Care | Attending: Pediatrics | Admitting: Pediatrics

## 2024-05-15 ENCOUNTER — Emergency Department (HOSPITAL_COMMUNITY)

## 2024-05-15 DIAGNOSIS — I10 Essential (primary) hypertension: Secondary | ICD-10-CM | POA: Insufficient documentation

## 2024-05-15 DIAGNOSIS — K219 Gastro-esophageal reflux disease without esophagitis: Secondary | ICD-10-CM | POA: Diagnosis not present

## 2024-05-15 DIAGNOSIS — Z79899 Other long term (current) drug therapy: Secondary | ICD-10-CM | POA: Insufficient documentation

## 2024-05-15 DIAGNOSIS — Z87891 Personal history of nicotine dependence: Secondary | ICD-10-CM | POA: Insufficient documentation

## 2024-05-15 DIAGNOSIS — Z853 Personal history of malignant neoplasm of breast: Secondary | ICD-10-CM | POA: Diagnosis not present

## 2024-05-15 DIAGNOSIS — K921 Melena: Secondary | ICD-10-CM | POA: Insufficient documentation

## 2024-05-15 DIAGNOSIS — K449 Diaphragmatic hernia without obstruction or gangrene: Secondary | ICD-10-CM | POA: Insufficient documentation

## 2024-05-15 DIAGNOSIS — K254 Chronic or unspecified gastric ulcer with hemorrhage: Principal | ICD-10-CM | POA: Insufficient documentation

## 2024-05-15 DIAGNOSIS — Q399 Congenital malformation of esophagus, unspecified: Secondary | ICD-10-CM | POA: Insufficient documentation

## 2024-05-15 DIAGNOSIS — M109 Gout, unspecified: Secondary | ICD-10-CM | POA: Insufficient documentation

## 2024-05-15 DIAGNOSIS — I2510 Atherosclerotic heart disease of native coronary artery without angina pectoris: Secondary | ICD-10-CM | POA: Diagnosis not present

## 2024-05-15 DIAGNOSIS — D649 Anemia, unspecified: Principal | ICD-10-CM

## 2024-05-15 DIAGNOSIS — I252 Old myocardial infarction: Secondary | ICD-10-CM | POA: Insufficient documentation

## 2024-05-15 DIAGNOSIS — M353 Polymyalgia rheumatica: Secondary | ICD-10-CM | POA: Insufficient documentation

## 2024-05-15 DIAGNOSIS — R1310 Dysphagia, unspecified: Secondary | ICD-10-CM | POA: Diagnosis not present

## 2024-05-15 DIAGNOSIS — D509 Iron deficiency anemia, unspecified: Secondary | ICD-10-CM | POA: Diagnosis not present

## 2024-05-15 DIAGNOSIS — Z96653 Presence of artificial knee joint, bilateral: Secondary | ICD-10-CM | POA: Diagnosis not present

## 2024-05-15 DIAGNOSIS — K922 Gastrointestinal hemorrhage, unspecified: Secondary | ICD-10-CM | POA: Diagnosis not present

## 2024-05-15 DIAGNOSIS — R0789 Other chest pain: Secondary | ICD-10-CM | POA: Diagnosis not present

## 2024-05-15 DIAGNOSIS — K3189 Other diseases of stomach and duodenum: Secondary | ICD-10-CM | POA: Insufficient documentation

## 2024-05-15 DIAGNOSIS — K259 Gastric ulcer, unspecified as acute or chronic, without hemorrhage or perforation: Secondary | ICD-10-CM

## 2024-05-15 DIAGNOSIS — Z96611 Presence of right artificial shoulder joint: Secondary | ICD-10-CM | POA: Diagnosis not present

## 2024-05-15 DIAGNOSIS — Z7902 Long term (current) use of antithrombotics/antiplatelets: Secondary | ICD-10-CM | POA: Insufficient documentation

## 2024-05-15 DIAGNOSIS — K222 Esophageal obstruction: Secondary | ICD-10-CM | POA: Insufficient documentation

## 2024-05-15 DIAGNOSIS — R079 Chest pain, unspecified: Secondary | ICD-10-CM | POA: Diagnosis not present

## 2024-05-15 DIAGNOSIS — D62 Acute posthemorrhagic anemia: Secondary | ICD-10-CM | POA: Diagnosis not present

## 2024-05-15 LAB — HEPATIC FUNCTION PANEL
ALT: 17 U/L (ref 0–44)
AST: 28 U/L (ref 15–41)
Albumin: 3.8 g/dL (ref 3.5–5.0)
Alkaline Phosphatase: 55 U/L (ref 38–126)
Bilirubin, Direct: 0.1 mg/dL (ref 0.0–0.2)
Indirect Bilirubin: 0.1 mg/dL — ABNORMAL LOW (ref 0.3–0.9)
Total Bilirubin: 0.2 mg/dL (ref 0.0–1.2)
Total Protein: 5.7 g/dL — ABNORMAL LOW (ref 6.5–8.1)

## 2024-05-15 LAB — CBC
HCT: 27.3 % — ABNORMAL LOW (ref 36.0–46.0)
Hemoglobin: 8.5 g/dL — ABNORMAL LOW (ref 12.0–15.0)
MCH: 28.2 pg (ref 26.0–34.0)
MCHC: 31.1 g/dL (ref 30.0–36.0)
MCV: 90.7 fL (ref 80.0–100.0)
Platelets: 214 K/uL (ref 150–400)
RBC: 3.01 MIL/uL — ABNORMAL LOW (ref 3.87–5.11)
RDW: 15.9 % — ABNORMAL HIGH (ref 11.5–15.5)
WBC: 6.2 K/uL (ref 4.0–10.5)
nRBC: 0 % (ref 0.0–0.2)

## 2024-05-15 LAB — BASIC METABOLIC PANEL WITH GFR
Anion gap: 13 (ref 5–15)
BUN: 22 mg/dL (ref 8–23)
CO2: 22 mmol/L (ref 22–32)
Calcium: 9.5 mg/dL (ref 8.9–10.3)
Chloride: 107 mmol/L (ref 98–111)
Creatinine, Ser: 1.19 mg/dL — ABNORMAL HIGH (ref 0.44–1.00)
GFR, Estimated: 46 mL/min — ABNORMAL LOW (ref >60)
Glucose, Bld: 90 mg/dL (ref 70–99)
Potassium: 3.9 mmol/L (ref 3.5–5.1)
Sodium: 142 mmol/L (ref 135–145)

## 2024-05-15 LAB — HEMOGLOBIN AND HEMATOCRIT, BLOOD
HCT: 24.2 % — ABNORMAL LOW (ref 36.0–46.0)
HCT: 23.6 % — ABNORMAL LOW (ref 36.0–46.0)
Hemoglobin: 7.4 g/dL — ABNORMAL LOW (ref 12.0–15.0)
Hemoglobin: 7.4 g/dL — ABNORMAL LOW (ref 12.0–15.0)

## 2024-05-15 LAB — TYPE AND SCREEN
ABO/RH(D): B POS
Antibody Screen: NEGATIVE

## 2024-05-15 LAB — TROPONIN T, HIGH SENSITIVITY
Troponin T High Sensitivity: 19 ng/L (ref 0–19)
Troponin T High Sensitivity: 18 ng/L (ref 0–19)

## 2024-05-15 LAB — POC OCCULT BLOOD, ED: Fecal Occult Bld: POSITIVE — AB

## 2024-05-15 LAB — LIPASE, BLOOD: Lipase: 40 U/L (ref 11–51)

## 2024-05-15 MED ORDER — ACETAMINOPHEN 325 MG PO TABS
650.0000 mg | ORAL_TABLET | Freq: Once | ORAL | Status: DC
Start: 2024-05-15 — End: 2024-05-15
  Filled 2024-05-15: qty 2

## 2024-05-15 MED ORDER — ACETAMINOPHEN 650 MG RE SUPP: 650.0000 mg | Freq: Four times a day (QID) | RECTAL | Status: DC | PRN

## 2024-05-15 MED ORDER — ONDANSETRON HCL 4 MG/2ML IJ SOLN
4.0000 mg | Freq: Four times a day (QID) | INTRAMUSCULAR | Status: DC | PRN
  Administered 2024-05-16: 4 mg via INTRAVENOUS
  Filled 2024-05-15: qty 2

## 2024-05-15 MED ORDER — TRAZODONE HCL 50 MG PO TABS
25.0000 mg | ORAL_TABLET | Freq: Every evening | ORAL | Status: DC | PRN
  Administered 2024-05-16 – 2024-05-17 (×2): 25 mg via ORAL
  Filled 2024-05-15 (×2): qty 1

## 2024-05-15 MED ORDER — ONDANSETRON HCL 4 MG PO TABS: 4.0000 mg | ORAL_TABLET | Freq: Four times a day (QID) | ORAL | Status: DC | PRN

## 2024-05-15 MED ORDER — PANTOPRAZOLE SODIUM 40 MG IV SOLR
40.0000 mg | Freq: Two times a day (BID) | INTRAVENOUS | Status: DC
Start: 2024-05-15 — End: 2024-05-18
  Administered 2024-05-15 – 2024-05-18 (×6): 40 mg via INTRAVENOUS
  Filled 2024-05-15 (×6): qty 10

## 2024-05-15 MED ORDER — LACTATED RINGERS IV BOLUS
500.0000 mL | Freq: Once | INTRAVENOUS | Status: AC
  Administered 2024-05-15: 500 mL via INTRAVENOUS

## 2024-05-15 MED ORDER — ACETAMINOPHEN 325 MG PO TABS
650.0000 mg | ORAL_TABLET | Freq: Four times a day (QID) | ORAL | Status: DC | PRN
  Administered 2024-05-16 – 2024-05-17 (×2): 650 mg via ORAL
  Filled 2024-05-15 (×2): qty 2

## 2024-05-15 MED ORDER — ALBUTEROL SULFATE (2.5 MG/3ML) 0.083% IN NEBU: 2.5000 mg | INHALATION_SOLUTION | RESPIRATORY_TRACT | Status: DC | PRN

## 2024-05-15 MED ORDER — PANTOPRAZOLE SODIUM 40 MG IV SOLR
40.0000 mg | Freq: Once | INTRAVENOUS | Status: AC
  Administered 2024-05-15: 40 mg via INTRAVENOUS
  Filled 2024-05-15: qty 10

## 2024-05-15 MED ORDER — ACETAMINOPHEN 160 MG/5ML PO SOLN
650.0000 mg | Freq: Once | ORAL | Status: AC
  Administered 2024-05-15: 650 mg via ORAL
  Filled 2024-05-15: qty 20.3

## 2024-05-15 MED ORDER — PANTOPRAZOLE SODIUM 40 MG IV SOLR: 40.0000 mg | INTRAVENOUS | Status: DC

## 2024-05-15 NOTE — ED Provider Notes (Signed)
[place] EMERGENCY DEPARTMENT AT [facility] Provider Note   CSN: [id number] Arrival date & time: 05/15/24  1041     Patient presents with: Chest Pain   Tracey [name] is a 78 y.o. female.    Chest Pain    Patient has a history of arthritis hyperlipidemia, gout, breast cancer, acid reflux, sleep apnea, coronary artery disease, hypertension, and STEMI, esophageal stricture status post esophageal dilatation.  Patient presents ED with complaints of chest pain difficulty swallowing.  Patient states she started noticing the symptoms 3 days ago.  Feels like a heaviness in her chest.  Patient also has noticed she has had increased pain with swallowing and is having more difficulty swallowing liquids as well as solids.  Patient states she has noticed dark-colored stools.  She talked to her doctor who instructed her to come to the ED for evaluation  Prior to Admission medications   Medication Sig Start Date End Date Taking? Authorizing Provider  allopurinol  (ZYLOPRIM ) 100 MG tablet TAKE 1 TABLET BY MOUTH ONCE DAILY 06/25/16   Magrinat, Sandria BROCKS, MD  Budeson-Glycopyrrol-Formoterol  (BREZTRI  AEROSPHERE) 160-9-4.8 MCG/ACT AERO Inhale 2 puffs into the lungs in the morning and at bedtime. Patient taking differently: Inhale 2 puffs into the lungs as needed (wheezing/SOB). 06/19/22   Darlean Ozell NOVAK, MD  Cholecalciferol  (VITAMIN D3) 50 MCG (2000 UT) TABS Take 2,000 Units by mouth daily.    [provider]  clopidogrel  (PLAVIX ) 75 MG tablet TAKE 1 TABLET(75 MG) BY MOUTH DAILY 07/08/23   Wonda Ozell, MD  cyanocobalamin  (,VITAMIN B-12,) 1000 MCG/ML injection Inject 1,000 mcg into the muscle every 30 (thirty) days.    [provider]  estradiol (ESTRACE) 0.1 MG/GM vaginal cream USE 1 GIVE INTO THE VAGINA EVERY NIGHT AT BEDTIME FOR 14 DAYS THEN TWICE A WEEK THEREAFTER    [provider]  fluticasone  (FLONASE ) 50 MCG/ACT nasal spray Place 2 sprays into both nostrils  daily. 01/18/13   [provider]  folic acid  (FOLVITE ) 1 MG tablet Take 1 mg by mouth daily.    [provider]  furosemide  (LASIX ) 40 MG tablet Take 40 mg by mouth daily as needed for fluid.  04/01/13   [provider]  Hydroxychloroquine  Sulfate 300 MG TABS Take 1 tablet by mouth daily.    [provider]  ketoconazole  (NIZORAL ) 2 % shampoo Apply 1 Application topically as needed. 08/24/23   [provider]  lidocaine  (LIDODERM ) 5 % Place 1 patch onto the skin daily. Remove & Discard patch within 12 hours or as directed by MD 12/25/23   Neysa Caron PARAS, DO  loratadine  (CLARITIN ) 10 MG tablet Take 10 mg by mouth daily.    [provider]  metoprolol  succinate (TOPROL -XL) 25 MG 24 hr tablet TAKE 1 TABLET(25 MG) BY MOUTH DAILY 12/10/23   Cooper, Michael, MD  Multiple Vitamin (MULTI-VITAMIN) tablet Take 1 tablet by mouth daily.    [provider]  MYRBETRIQ 50 MG TB24 tablet Take 1 tablet by mouth daily. Hold for 7 days , 01-03-2024.    [provider]  nitroGLYCERIN  (NITROSTAT ) 0.4 MG SL tablet Place 1 tablet (0.4 mg total) under the tongue every 5 (five) minutes as needed for chest pain. 09/29/23   Wonda Ozell, MD  ORENCIA CLICKJECT 125 MG/ML SOAJ Inject 125 mg into the skin once a week. 12/13/23   [provider]  pantoprazole  (PROTONIX ) 40 MG tablet Take 1 tablet (40 mg total) by mouth 2 (two) times daily.  12/17/23   Charlanne Groom, MD  polyethylene glycol (MIRALAX  / GLYCOLAX ) 17 g packet Take 17 g by mouth daily as needed for mild constipation. 12/03/21   Patti Rosina SAUNDERS, PA-C  predniSONE  (DELTASONE ) 5 MG tablet Take 5 mg by mouth daily with breakfast.    [provider]  Propylene Glycol (SYSTANE COMPLETE) 0.6 % SOLN Place 2 drops into both eyes 2 (two) times daily.    [provider]  rosuvastatin  (CRESTOR ) 20 MG tablet Take 1 tablet (20 mg total) by mouth daily. 12/10/23   Wonda Sharper, MD  sertraline   (ZOLOFT ) 100 MG tablet Take 0.5 tablets (50 mg total) by mouth daily. 12/27/23   Dohmeier, Dedra, MD  testosterone  cypionate (DEPOTESTOSTERONE CYPIONATE) 200 MG/ML injection Inject 200 mg into the muscle every 30 (thirty) days. 01/17/19   [provider]  traMADol  (ULTRAM ) 50 MG tablet Take 100 mg by mouth 2 (two) times daily.    [provider]    Allergies: Iodine , Pseudoeph-hydrocodone -gg, Ivp dye [iodinated contrast media], Methocarbamol , Oxycodone , Shellfish-derived products, Sulfa antibiotics, and Tape    Review of Systems  Cardiovascular:  Positive for chest pain.    Updated Vital Signs BP (!) 146/63   Pulse (!) 57   Temp 98.3 F (36.8 C) (Oral)   Resp 16   LMP 08/17/1977 (Approximate)   SpO2 100%   Physical Exam Vitals and nursing note reviewed.  Constitutional:      Appearance: She is well-developed. She is not diaphoretic.  HENT:     Head: Normocephalic and atraumatic.     Right Ear: External ear normal.     Left Ear: External ear normal.  Eyes:     General: No scleral icterus.       Right eye: No discharge.        Left eye: No discharge.     Conjunctiva/sclera: Conjunctivae normal.  Neck:     Trachea: No tracheal deviation.  Cardiovascular:     Rate and Rhythm: Normal rate and regular rhythm.  Pulmonary:     Effort: Pulmonary effort is normal. No respiratory distress.     Breath sounds: Normal breath sounds. No stridor. No wheezing or rales.  Abdominal:     General: Bowel sounds are normal. There is no distension.     Palpations: Abdomen is soft.     Tenderness: There is no abdominal tenderness. There is no guarding or rebound.  Genitourinary:    Rectum: Guaiac result positive.     Comments: No mass noted on rectal exam, dark stools Hemoccult positive Musculoskeletal:        General: No tenderness or deformity.     Cervical back: Neck supple.  Skin:    General: Skin is warm and dry.     Findings: No rash.  Neurological:     General: No  focal deficit present.     Mental Status: She is alert.     Cranial Nerves: No cranial nerve deficit, dysarthria or facial asymmetry.     Sensory: No sensory deficit.     Motor: No abnormal muscle tone or seizure activity.     Coordination: Coordination normal.  Psychiatric:        Mood and Affect: Mood normal.     (all labs ordered are listed, but only abnormal results are displayed) Labs Reviewed  BASIC METABOLIC PANEL WITH GFR - Abnormal; Notable for the following components:      Result Value   Creatinine, Ser 1.19 (*)    GFR,  Estimated 46 (*)    All other components within normal limits  CBC - Abnormal; Notable for the following components:   RBC 3.01 (*)    Hemoglobin 8.5 (*)    HCT 27.3 (*)    RDW 15.9 (*)    All other components within normal limits  HEPATIC FUNCTION PANEL - Abnormal; Notable for the following components:   Total Protein 5.7 (*)    Indirect Bilirubin 0.1 (*)    All other components within normal limits  POC OCCULT BLOOD, ED - Abnormal; Notable for the following components:   Fecal Occult Bld POSITIVE (*)    All other components within normal limits  LIPASE, BLOOD  TYPE AND SCREEN  TROPONIN T, HIGH SENSITIVITY  TROPONIN T, HIGH SENSITIVITY    EKG: EKG Interpretation Date/Time:  Monday May 15 2024 11:38:02 EDT Ventricular Rate:  55 PR Interval:  137 QRS Duration:  83 QT Interval:  432 QTC Calculation: 414 R Axis:   50  Text Interpretation: Sinus rhythm Low voltage, precordial leads Abnormal R-wave progression, early transition Confirmed by Randol Simmonds [phone]) on 05/15/2024 11:41:23 AM  Radiology: [facility] Chest 2 View Result Date: 05/15/2024 CLINICAL DATA:  Chest pain. EXAM: CHEST - 2 VIEW COMPARISON:  12/25/2023 FINDINGS: The heart size and mediastinal contours are within normal limits. Both lungs are clear. Thoracic spine degenerative changes and right shoulder prosthesis again noted. IMPRESSION: No active cardiopulmonary disease.  Electronically Signed   By: Norleen DELENA Kil M.D.   On: 05/15/2024 11:32     Procedures   Medications Ordered in the ED  pantoprazole  (PROTONIX ) injection 40 mg (40 mg Intravenous Given 05/15/24 1244)  acetaminophen  (TYLENOL ) 160 MG/5ML solution 650 mg (650 mg Oral Given 05/15/24 1256)    Clinical Course as of 05/15/24 1334  Mon May 15, 2024  1135 POC occult blood, ED(!) Hemoccult positive [JK]  1135 CBC(!) CBC shows anemia, hemoglobin lower compared to previous [JK]  1135 Basic metabolic panel(!) The [place] panel normal [JK]  1135 Chest x-ray without acute abnormality [JK]  1237 Case discussed with [facility], Delon Failing.  They will see patient in consultation [JK]  1332 Case discussed with Dr Roxane regarding admission [JK]    Clinical Course User Index [JK] Randol Simmonds, MD                                 Medical Decision Making Amount and/or Complexity of Data Reviewed Labs: ordered. Decision-making details documented in ED Course. Radiology: ordered.  Risk OTC drugs. Prescription drug management. Decision regarding hospitalization.   Patient presented to the ED for evaluation of chest pain as well as difficulty swallowing, dark-colored stools.  Patient does have a history of heart disease.  Consider the possibility of acute coronary syndrome, NSTEMI.  GI esophageal etiology is also concern.  ED workup does show worsening anemia with Hemoccult positive stools.  Presentation concerning for upper GI bleeding.  No indication for transfusion at this time.  No evidence of hepatitis pancreatitis.  Initial troponin is normal.  Will continue to monitor but doubt ACS at this time.  I have consulted with gastroenterology as well as hospitalist service.  Will plan on admission for further treatment    Final diagnoses:  Gastrointestinal hemorrhage, unspecified gastrointestinal hemorrhage type  Dysphagia, unspecified type    ED Discharge Orders     None          Randol Simmonds, MD  05/15/24 1334  

## 2024-05-15 NOTE — Consult Note (Addendum)
 Consultation  Referring Provider: Dr. Randol     Primary Care Physician:  Larnell Hamilton, MD Primary Gastroenterologist: Dr. Charlanne        Reason for Consultation:  GI Bleed, Atypical chest pain and dysphagia            HPI:   Tracey [name] is a 78 y.o. female with a past medical history as listed below including CAD on Plavix  (06/16/2023 echo with LVEF 55-60%), polymyalgia rheumatica, breast cancer, GERD, history of Schatzki's ring, OSA and multiple others, who presented to the ED with complaints of chest pain and difficulty swallowing.   At time of presentation patient discussed that she started noticing some symptoms 3 days ago that felt like a heaviness in her chest.  She had some increased pain with swallowing and more difficulty swallowing liquids as well as solids.  Also noticed that her stools started to have a dark color.    Today, the patient is seen with her family by her bedside who assists with history.  She explains that after the first couple of months from having EGD she did feel better as far as her trouble swallowing, but then her symptoms started to come back about a month or 2 ago.  Tell me that sometimes she would have trouble drinking even liquids.  Last week on 05/06/2024 she started with some shortness of breath and felt like there was an elephant on her chest, that night she tried to get up to go to the bathroom but felt weak and fell onto the floor, apparently she had a bowel movement at the time which was black in color, she remained somewhat fatigued and short of breath through the next days, continued to have black stools at least once a day which were tarry.  Continued to feel more weak.  She did have 1 episode of vomiting on the first evening, since then has remained somewhat nauseous.  Continues to feel like food and liquids get stuck or that her esophagus spasms.  She denies any NSAID use.  She has been on Pantoprazole  40 mg once a day (originally took this twice a  day for about a month and then decrease back down to once daily).  Has remained on Plavix  with her last dose yesterday 05/14/2024.  Associated symptoms include a headache some chest discomfort and patient tells me there is tingling in her hands.    Describes chronic constipation requiring dates daily and sometimes stool softeners.    Describes previous diagnosis of iron deficiency, but unable to take oral iron tablets due to constipation.    Discusses that she was started did on Orencia back in February of this year for her PMR.    Denies fever, chills or weight loss.  ER course: Hemoccult positive stool, hemoglobin 8.5 (11.9 on 04/05/2024 with PCP), iron deficiency, BUN normal, lipase and hepatic function panel pending, chest x-ray with no active cardiopulmonary disease, normal Troponins, in the ED BP 146/63, pulse 57  Recent labs: With [facility]. Kayla at [facility] 04/05/2024 with a normal BUN at 23, B12 normal at 313, iron studies with a percent saturation low at 9, iron 34, TIBC 363 (12/20/2023 iron saturation low at 7%, iron 27, TIBC 385), hemoglobin 11.9 on 8/20, 11 on 12/20/2023,  GI history: 02/08/2024 EGD done for dysphagia and a barium swallow showing hang up of barium tablet in the distal esophagus as well as moderate hiatal hernia with a Schatzki's ring found and dilated  to 15 mm, 5 cm hiatal hernia, no Cameron erosions, localized moderate inflammation characterized by erosions erythema and friability in the gastric antrum, torturous lower third of the esophagus patient told to increase Pantoprazole  to 40 mg twice daily for 8 weeks and then decrease to once daily 12/20/2023 esophagram with torturous esophagus and moderate dysmotility as well as a small to moderate sliding hiatal hernia and a 13 mm barium tablet failed to pass the GE junction consistent with mild stricturing 12/17/2023 office visit with Dr. Charlanne and discussed GERD with dysphagia and chronic constipation with a negative  colonoscopy by Dr. Rollin 5 to 6 years ago  Past Medical History:  Diagnosis Date   Allergy    Arthritis    Breast cancer (HCC) 2014   ER+/PR+/HEr2-,    Bruises easily    Colon polyps    2 polyps by report   Coronary artery disease involving native coronary artery of native heart without angina pectoris 01/01/2014   NSTEMI in January 2019 in [place] >> s/p DES x2 to the LAD (procedure complicated by stent thrombosis) Myoview  05/2020: EF 72, no infarct or ischemia; low risk Echocardiogram 05/2019: EF 60-65, GLS -21.5 Cardiac catheterization at [facility] in 08/2017: Normal left main, normal LCx, normal RCA; LAD/diagonal 95%>> PCI    Diverticulosis    GERD (gastroesophageal reflux disease)    occasionally takes Nexium     Gout    History of bladder infections    many yrs ago   History of bronchitis    last time at least 72yrs ago   History of hiatal hernia    History of stress incontinence    Hyperlipidemia    takes Pravastatin  daily   Hypertension    Insomnia    takes Ambien nightly prn   NSTEMI (non-ST elevated myocardial infarction) (HCC) 08/2017   OSA (obstructive sleep apnea)    on CPAP   Osteoarthritis    Peripheral edema    takes Furosemide  daily prn   Pneumonia    PONV (postoperative nausea and vomiting)    Postmenopausal hormone therapy    Radiation 07/27/13-09/07/13   Right Breast x 31 treatments   Rhinitis    uses Flonase  prn   Sinus congestion    Status post breast reconstruction 10/15/2014   Bilateral implant removal and DIEP performed in [place], [place]   Tachycardia    takes Metoprolol  daily   Ventricular tachycardia, non-sustained (HCC)    during sleep study 2009 with normal cardiac workup    Past Surgical History:  Procedure Laterality Date   ABDOMINAL HYSTERECTOMY     with BSO   APPENDECTOMY     ARTERY BIOPSY Left 05/15/2022   Procedure: BIOPSY TEMPORAL ARTERY;  Surgeon: Gretta Lonni PARAS, MD;  Location: [facility] OR;  Service: Vascular;   Laterality: Left;   AXILLARY SENTINEL NODE BIOPSY Right 05/23/2013   Procedure: AXILLARY SENTINEL NODE BIOPSY;  Surgeon: Krystal PARAS Russell, MD;  Location: [facility] OR;  Service: General;  Laterality: Right;  nuc med injection 7:00   BALLOON DILATION N/A 02/08/2024   Procedure: MERRILL HODGKIN;  Surgeon: Charlanne Groom, MD;  Location: [facility];  Service: Gastroenterology;  Laterality: N/A;   BREAST RECONSTRUCTION WITH PLACEMENT OF TISSUE EXPANDER AND FLEX HD (ACELLULAR HYDRATED DERMIS) Bilateral 05/23/2013   Procedure: BILATERAL BREAST RECONSTRUCTION WITH PLACEMENT OF TISSUE EXPANDER AND FLEX HD;  Surgeon: Alm Sick, MD;  Location: [facility] OR;  Service: Plastics;  Laterality: Bilateral;   CARDIAC CATHETERIZATION  1999   CATARACT EXTRACTION,  BILATERAL Bilateral    CHOLECYSTECTOMY     COSMETIC SURGERY     ESOPHAGOGASTRODUODENOSCOPY N/A 02/08/2024   Procedure: EGD (ESOPHAGOGASTRODUODENOSCOPY);  Surgeon: Charlanne Groom, MD;  Location: [facility];  Service: Gastroenterology;  Laterality: N/A;   EXPLORATORY LAPAROTOMY     OTHER SURGICAL HISTORY  09/2017   had cyst removal from spine    PERCUTANEOUS CORONARY STENT INTERVENTION (PCI-S)  08/2017   done in [place]      REMOVAL OF BILATERAL TISSUE EXPANDERS WITH PLACEMENT OF BILATERAL BREAST IMPLANTS Bilateral 05/17/2014   Procedure: REMOVAL OF BILATERAL TISSUE EXPANDERS WITH PLACEMENT OF BILATERAL BREAST IMPLANTS FOR RECONSTRUCTION;  Surgeon: Alm Sick, MD;  Location: [facility] OR;  Service: Plastics;  Laterality: Bilateral;   REVERSE SHOULDER ARTHROPLASTY Right 05/13/2023   Procedure: REVERSE SHOULDER ARTHROPLASTY;  Surgeon: Melita Drivers, MD;  Location: [facility];  Service: Orthopedics;  Laterality: Right;   TONSILLECTOMY     TOTAL KNEE ARTHROPLASTY Right 07/26/2018   Procedure: TOTAL KNEE ARTHROPLASTY;  Surgeon: Ernie Cough, MD;  Location: [facility];  Service: Orthopedics;  Laterality: Right;  70 mins   TOTAL KNEE ARTHROPLASTY Left 12/02/2021   Procedure: TOTAL  KNEE ARTHROPLASTY;  Surgeon: Ernie Cough, MD;  Location: [facility];  Service: Orthopedics;  Laterality: Left;   TOTAL MASTECTOMY Bilateral 05/23/2013   Procedure: TOTAL MASTECTOMY;  Surgeon: Krystal JINNY Russell, MD;  Location: [facility] OR;  Service: General;  Laterality: Bilateral;    Family History  Problem Relation Age of Onset   Breast cancer Mother        possible inflammatory breast cancer   Heart attack Father    Heart disease Father    ALS Sister    Breast cancer Maternal Grandmother 52   Heart attack Maternal Grandfather    Other Paternal Grandmother        ruptured appendix   Heart attack Paternal Grandfather    Breast cancer Maternal Aunt        dx in her 106s   Breast cancer Other        maternal great grandmother; dx in her 33s   Thyroid  disease Daughter     Social History   Tobacco Use   Smoking status: Former    Current packs/day: 1.00    Average packs/day: 1 pack/day for 31.9 years (31.9 ttl pk-yrs)    Types: Cigarettes    Start date: 06/09/1992   Smokeless tobacco: Never   Tobacco comments:    quit at age 83  Vaping Use   Vaping status: Never Used  Substance Use Topics   Alcohol  use: Yes    Alcohol /week: 1.0 standard drink of alcohol     Types: 1 Standard drinks or equivalent per week    Comment: 1 a day   Drug use: Never    Prior to Admission medications   Medication Sig Start Date End Date Taking? Authorizing Provider  allopurinol  (ZYLOPRIM ) 100 MG tablet TAKE 1 TABLET BY MOUTH ONCE DAILY 06/25/16   Magrinat, Sandria BROCKS, MD  Budeson-Glycopyrrol-Formoterol  (BREZTRI  AEROSPHERE) 160-9-4.8 MCG/ACT AERO Inhale 2 puffs into the lungs in the morning and at bedtime. Patient taking differently: Inhale 2 puffs into the lungs as needed (wheezing/SOB). 06/19/22   Darlean Ozell NOVAK, MD  Cholecalciferol  (VITAMIN D3) 50 MCG (2000 UT) TABS Take 2,000 Units by mouth daily.    [provider]  clopidogrel  (PLAVIX ) 75 MG tablet TAKE 1 TABLET(75 MG) BY MOUTH DAILY 07/08/23    Wonda Ozell, MD  cyanocobalamin  (,VITAMIN B-12,) 1000 MCG/ML injection Inject 1,000 mcg  into the muscle every 30 (thirty) days.    [provider]  estradiol (ESTRACE) 0.1 MG/GM vaginal cream USE 1 GIVE INTO THE VAGINA EVERY NIGHT AT BEDTIME FOR 14 DAYS THEN TWICE A WEEK THEREAFTER    [provider]  fluticasone  (FLONASE ) 50 MCG/ACT nasal spray Place 2 sprays into both nostrils daily. 01/18/13   [provider]  folic acid  (FOLVITE ) 1 MG tablet Take 1 mg by mouth daily.    [provider]  furosemide  (LASIX ) 40 MG tablet Take 40 mg by mouth daily as needed for fluid.  04/01/13   [provider]  Hydroxychloroquine  Sulfate 300 MG TABS Take 1 tablet by mouth daily.    [provider]  ketoconazole  (NIZORAL ) 2 % shampoo Apply 1 Application topically as needed. 08/24/23   [provider]  lidocaine  (LIDODERM ) 5 % Place 1 patch onto the skin daily. Remove & Discard patch within 12 hours or as directed by MD 12/25/23   Neysa Caron PARAS, DO  loratadine  (CLARITIN ) 10 MG tablet Take 10 mg by mouth daily.    [provider]  metoprolol  succinate (TOPROL -XL) 25 MG 24 hr tablet TAKE 1 TABLET(25 MG) BY MOUTH DAILY 12/10/23   Cooper, Michael, MD  Multiple Vitamin (MULTI-VITAMIN) tablet Take 1 tablet by mouth daily.    [provider]  MYRBETRIQ 50 MG TB24 tablet Take 1 tablet by mouth daily. Hold for 7 days , 01-03-2024.    [provider]  nitroGLYCERIN  (NITROSTAT ) 0.4 MG SL tablet Place 1 tablet (0.4 mg total) under the tongue every 5 (five) minutes as needed for chest pain. 09/29/23   Wonda Sharper, MD  ORENCIA CLICKJECT 125 MG/ML SOAJ Inject 125 mg into the skin once a week. 12/13/23   [provider]  pantoprazole  (PROTONIX ) 40 MG tablet Take 1 tablet (40 mg total) by mouth 2 (two) times daily. 12/17/23   Charlanne Groom, MD  polyethylene glycol (MIRALAX  / GLYCOLAX ) 17 g packet Take 17 g by mouth daily as needed for  mild constipation. 12/03/21   Patti Rosina SAUNDERS, PA-C  predniSONE  (DELTASONE ) 5 MG tablet Take 5 mg by mouth daily with breakfast.    [provider]  Propylene Glycol (SYSTANE COMPLETE) 0.6 % SOLN Place 2 drops into both eyes 2 (two) times daily.    [provider]  rosuvastatin  (CRESTOR ) 20 MG tablet Take 1 tablet (20 mg total) by mouth daily. 12/10/23   Wonda Sharper, MD  sertraline  (ZOLOFT ) 100 MG tablet Take 0.5 tablets (50 mg total) by mouth daily. 12/27/23   Dohmeier, Dedra, MD  testosterone  cypionate (DEPOTESTOSTERONE CYPIONATE) 200 MG/ML injection Inject 200 mg into the muscle every 30 (thirty) days. 01/17/19   [provider]  traMADol  (ULTRAM ) 50 MG tablet Take 100 mg by mouth 2 (two) times daily.    [provider]    Current Facility-Administered Medications  Medication Dose Route Frequency Provider Last Rate Last Admin   pantoprazole  (PROTONIX ) injection 40 mg  40 mg Intravenous Once Knapp, Jon, MD       Current Outpatient Medications  Medication Sig Dispense Refill   allopurinol  (ZYLOPRIM ) 100 MG tablet TAKE 1 TABLET BY MOUTH ONCE DAILY 90 tablet 0   Budeson-Glycopyrrol-Formoterol  (BREZTRI  AEROSPHERE) 160-9-4.8 MCG/ACT AERO Inhale 2 puffs into the lungs in the morning and at bedtime. (Patient taking differently: Inhale 2 puffs into the lungs as needed (wheezing/SOB).) 5.9 g 0   Cholecalciferol  (VITAMIN D3) 50 MCG (2000 UT) TABS Take 2,000 Units by  mouth daily.     clopidogrel  (PLAVIX ) 75 MG tablet TAKE 1 TABLET(75 MG) BY MOUTH DAILY 90 tablet 3   cyanocobalamin  (,VITAMIN B-12,) 1000 MCG/ML injection Inject 1,000 mcg into the muscle every 30 (thirty) days.     estradiol (ESTRACE) 0.1 MG/GM vaginal cream USE 1 GIVE INTO THE VAGINA EVERY NIGHT AT BEDTIME FOR 14 DAYS THEN TWICE A WEEK THEREAFTER     fluticasone  (FLONASE ) 50 MCG/ACT nasal spray Place 2 sprays into both nostrils daily.     folic acid  (FOLVITE ) 1 MG tablet Take 1 mg by mouth daily.      furosemide  (LASIX ) 40 MG tablet Take 40 mg by mouth daily as needed for fluid.      Hydroxychloroquine  Sulfate 300 MG TABS Take 1 tablet by mouth daily.     ketoconazole  (NIZORAL ) 2 % shampoo Apply 1 Application topically as needed.     lidocaine  (LIDODERM ) 5 % Place 1 patch onto the skin daily. Remove & Discard patch within 12 hours or as directed by MD 9 patch 0   loratadine  (CLARITIN ) 10 MG tablet Take 10 mg by mouth daily.     metoprolol  succinate (TOPROL -XL) 25 MG 24 hr tablet TAKE 1 TABLET(25 MG) BY MOUTH DAILY 90 tablet 1   Multiple Vitamin (MULTI-VITAMIN) tablet Take 1 tablet by mouth daily.     MYRBETRIQ 50 MG TB24 tablet Take 1 tablet by mouth daily. Hold for 7 days , 01-03-2024.     nitroGLYCERIN  (NITROSTAT ) 0.4 MG SL tablet Place 1 tablet (0.4 mg total) under the tongue every 5 (five) minutes as needed for chest pain. 25 tablet 7   ORENCIA CLICKJECT 125 MG/ML SOAJ Inject 125 mg into the skin once a week.     pantoprazole  (PROTONIX ) 40 MG tablet Take 1 tablet (40 mg total) by mouth 2 (two) times daily. 60 tablet 6   polyethylene glycol (MIRALAX  / GLYCOLAX ) 17 g packet Take 17 g by mouth daily as needed for mild constipation. 14 each 0   predniSONE  (DELTASONE ) 5 MG tablet Take 5 mg by mouth daily with breakfast.     Propylene Glycol (SYSTANE COMPLETE) 0.6 % SOLN Place 2 drops into both eyes 2 (two) times daily.     rosuvastatin  (CRESTOR ) 20 MG tablet Take 1 tablet (20 mg total) by mouth daily. 90 tablet 1   sertraline  (ZOLOFT ) 100 MG tablet Take 0.5 tablets (50 mg total) by mouth daily. 90 tablet 1   testosterone  cypionate (DEPOTESTOSTERONE CYPIONATE) 200 MG/ML injection Inject 200 mg into the muscle every 30 (thirty) days.     traMADol  (ULTRAM ) 50 MG tablet Take 100 mg by mouth 2 (two) times daily.      Allergies as of 05/15/2024 - Review Complete 05/15/2024  Allergen Reaction Noted   Iodine  Hives and Swelling 07/28/2016   Pseudoeph-hydrocodone -gg Other (See Comments) 02/03/2019    Ivp dye [iodinated contrast media] Hives 06/09/2012   Methocarbamol  Other (See Comments) 12/17/2023   Oxycodone  Other (See Comments) 12/30/2021   Shellfish-derived products  12/17/2023   Sulfa antibiotics Swelling 06/09/2012   Tape Other (See Comments) 07/04/2018     Review of Systems:    Constitutional: No weight loss, fever or chills Skin: No rash Cardiovascular: No chest pain  Respiratory: No SOB  Gastrointestinal: See HPI and otherwise negative Genitourinary: No dysuria Neurological:+headache Musculoskeletal: No new muscle or joint pain Hematologic: No  bruising Psychiatric: No history of depression or anxiety    Physical Exam:  Vital signs in last 24 hours:  Temp:  [98.3 F (36.8 C)] 98.3 F (36.8 C) (09/29 1056) Pulse Rate:  [57] 57 (09/29 1056) Resp:  [16] 16 (09/29 1056) BP: (146)/(63) 146/63 (09/29 1056) SpO2:  [100 %] 100 % (09/29 1056)   General:   Pleasant pale appearing elderly Caucasian female appears to be in NAD, Well developed, Well nourished, alert and cooperative Head:  Normocephalic and atraumatic. Eyes:   PEERL, EOMI. No icterus. Conjunctiva pink. Ears:  Normal auditory acuity. Neck:  Supple Throat: Oral cavity and pharynx without inflammation, swelling or lesion.  Lungs: Respirations even and unlabored. Lungs clear to auscultation bilaterally.   No wheezes, crackles, or rhonchi.  Heart: Normal S1, S2. No MRG. Regular rate and rhythm. No peripheral edema, cyanosis or pallor.  Abdomen:  Soft, nondistended, nontender. No rebound or guarding. Normal bowel sounds. No appreciable masses or hepatomegaly. Rectal:  +hemoccult in ED-pictures of melena seen on phone Msk:  Symmetrical without gross deformities. Peripheral pulses intact.  Extremities:  Without edema, no deformity or joint abnormality. Neurologic:  Alert and  oriented x4;  grossly normal neurologically.   Skin:   Dry and intact without significant lesions or rashes. Psychiatric: Demonstrates good  judgement and reason without abnormal affect or behaviors.   LAB RESULTS:    Latest Ref Rng & Units 05/15/2024   11:02 AM 05/19/2023    4:52 AM 05/18/2023    5:26 AM  CBC  WBC 4.0 - 10.5 K/uL 6.2  7.5  8.8   Hemoglobin 12.0 - 15.0 g/dL 8.5  9.8  89.9   Hematocrit 36.0 - 46.0 % 27.3  32.2  32.3   Platelets 150 - 400 K/uL 214  183  192     BMET Recent Labs    05/15/24 1102  NA 142  K 3.9  CL 107  CO2 22  GLUCOSE 90  BUN 22  CREATININE 1.19*  CALCIUM  9.5   Additional labs pending  STUDIES: DG Chest 2 View Result Date: 05/15/2024 CLINICAL DATA:  Chest pain. EXAM: CHEST - 2 VIEW COMPARISON:  12/25/2023 FINDINGS: The heart size and mediastinal contours are within normal limits. Both lungs are clear. Thoracic spine degenerative changes and right shoulder prosthesis again noted. IMPRESSION: No active cardiopulmonary disease. Electronically Signed   By: Norleen DELENA Kil M.D.   On: 05/15/2024 11:32     Impression / Plan:  Mrs. Prentiss is a 78 year old female with a past medical history including CAD and previous MI on Plavix  (last dose 05/14/2024), polymyalgia rheumatica on Orencia, history of previous Schatzki's ring last dilated in June, who presented to the hospital with some atypical chest pain and description of melena over the past 5 days with a decrease in hemoglobin 3 points over the past month with associated symptoms of weakness and headache.  Impression: 1.  Dysphagia/atypical chest pain/melena: Dysphagia initially resolved after EGD with dilation of Schatzki's ring in June, better for a month or 2, then symptoms came back towards liquids and solids describing a spasm as well, 5 days ago had increased chest discomfort and weakness with an episode of vomiting and proceeded to have melenic stools which have continued daily 1-2 times a day with her last yesterday morning, on Plavix  with her last dose yesterday 05/14/2024, at time of last EGD no Cameron erosions, but a 5 cm hiatal hernia,  previously iron deficient documented back in May, unable to take iron supplements per family due to constipation, last colonoscopy 5 to 6 years ago per Dr. Ira notes (cannot see procedure),  hgb 11.9 on 04/05/24-->8.5 today ; consider most likely upper GI bleed 2.  GERD with history of Schatzki's ring: Last dilated in June via EGD, maintained on Pantoprazole  40mg  every day see above 3.  CAD on Plavix : Last dose yesterday 05/14/2024 4.  Rheumatoid arthritis: Follows with Dr [facility] rheumatology for PMR on Orencia since February 5.  History of breast cancer: Status postmastectomy and adjuvant radiation pleated 5 years of antiestrogen therapy in 2019 6.  B12 deficiency: on monthly injections 7.  Chronic constipation: Typically takes dates and a mixture of stool softeners in order to have more normal bowel movements 8.  Iron deficiency anemia: Since at least May of this year, being followed by PCP, but unable to tolerate oral iron, see HPI for most recent iron labs; consider upper versus lower GI source  Plan: 1.  Patient would benefit from an EGD for further evaluation of upper GI bleed.  Will allow some time for Plavix  washout as patient appears stable at this point.  Did discuss risks,  benefits, limitations and alternatives and patient agrees to proceed. 2.  Continue to monitor hemoglobin with transfusion as needed per hospitalist recommendations. 3.  Consider iron infusion while here given iron deficiency anemia 4.  Patient will be on a clear liquid diet today and n.p.o. at midnight in case of worsening GI bleed 5.  Will increase Pantoprazole  to 40 mg IV twice daily 6.  Continue to monitor for further signs of GI bleeding 7.  Hold Plavix   Thank you for your kind consultation, we will continue to follow.  Delon Gibson [facility]  05/15/2024, 12:32 PM

## 2024-05-15 NOTE — ED Triage Notes (Signed)
 Pt presents with c/o chest pain that started 3 days ago. Pt reports the pain feels like heaviness in nature and in the center of her chest. Pt also reports a hx of having to have her esophagus stretched and reports that her MD is concerned that it is not working and that she may have a GI bleed of some sort.

## 2024-05-15 NOTE — H&P (Signed)
 History and Physical  Tracey [name] FMW:[id number] DOB: 1945-12-19 DOA: 05/15/2024  PCP: Larnell Hamilton, MD   Chief Complaint: Chest discomfort, black stools  HPI: Tracey [name] is a 78 y.o. female with medical history significant for CAD on Plavix , polymyalgia rheumatica, breast cancer, GERD, history of Schatzki's ring status post dilation June 2025 being admitted to the hospital with several days of dyspnea with exertion, chest discomfort and melanotic stools for the last 4 days found to have acute on chronic anemia and concern for upper GI bleed.  History is provided by the patient as well as her daughter who is at the bedside, and a Designer, jewellery.  Tell me she was in her usual state of health until she started having some chest discomfort at rest and with exertion about 5 days ago.  The same day, she started having melanotic stools.  She also vomited that day, no hematemesis.  No frank blood in her stools.  She denies significant abdominal pain, fevers, chills or sharp stabbing chest pain.  Evaluation in the ER as detailed below shows evidence of Hemoccult positive stool, and acute on chronic anemia.  Review of Systems: Please see HPI for pertinent positives and negatives. A complete 10 system review of systems are otherwise negative.  Past Medical History:  Diagnosis Date   Allergy    Arthritis    Breast cancer (HCC) 2014   ER+/PR+/HEr2-,    Bruises easily    Colon polyps    2 polyps by report   Coronary artery disease involving native coronary artery of native heart without angina pectoris 01/01/2014   NSTEMI in January 2019 in [place] >> s/p DES x2 to the LAD (procedure complicated by stent thrombosis) Myoview  05/2020: EF 72, no infarct or ischemia; low risk Echocardiogram 05/2019: EF 60-65, GLS -21.5 Cardiac catheterization at [facility] in 08/2017: Normal left main, normal LCx, normal RCA; LAD/diagonal 95%>> PCI    Diverticulosis    GERD (gastroesophageal  reflux disease)    occasionally takes Nexium     Gout    History of bladder infections    many yrs ago   History of bronchitis    last time at least 60yrs ago   History of hiatal hernia    History of stress incontinence    Hyperlipidemia    takes Pravastatin  daily   Hypertension    Insomnia    takes Ambien nightly prn   NSTEMI (non-ST elevated myocardial infarction) (HCC) 08/2017   OSA (obstructive sleep apnea)    on CPAP   Osteoarthritis    Peripheral edema    takes Furosemide  daily prn   Pneumonia    PONV (postoperative nausea and vomiting)    Postmenopausal hormone therapy    Radiation 07/27/13-09/07/13   Right Breast x 31 treatments   Rhinitis    uses Flonase  prn   Sinus congestion    Status post breast reconstruction 10/15/2014   Bilateral implant removal and DIEP performed in [place], [place]   Tachycardia    takes Metoprolol  daily   Ventricular tachycardia, non-sustained (HCC)    during sleep study 2009 with normal cardiac workup   Past Surgical History:  Procedure Laterality Date   ABDOMINAL HYSTERECTOMY     with BSO   APPENDECTOMY     ARTERY BIOPSY Left 05/15/2022   Procedure: BIOPSY TEMPORAL ARTERY;  Surgeon: Gretta Lonni PARAS, MD;  Location: [facility] OR;  Service: Vascular;  Laterality: Left;   AXILLARY SENTINEL NODE BIOPSY Right 05/23/2013  Procedure: AXILLARY SENTINEL NODE BIOPSY;  Surgeon: Krystal JINNY Russell, MD;  Location: [facility] OR;  Service: General;  Laterality: Right;  nuc med injection 7:00   BALLOON DILATION N/A 02/08/2024   Procedure: MERRILL HODGKIN;  Surgeon: Charlanne Groom, MD;  Location: [facility];  Service: Gastroenterology;  Laterality: N/A;   BREAST RECONSTRUCTION WITH PLACEMENT OF TISSUE EXPANDER AND FLEX HD (ACELLULAR HYDRATED DERMIS) Bilateral 05/23/2013   Procedure: BILATERAL BREAST RECONSTRUCTION WITH PLACEMENT OF TISSUE EXPANDER AND FLEX HD;  Surgeon: Alm Sick, MD;  Location: [facility] OR;  Service: Plastics;  Laterality: Bilateral;   CARDIAC  CATHETERIZATION  1999   CATARACT EXTRACTION, BILATERAL Bilateral    CHOLECYSTECTOMY     COSMETIC SURGERY     ESOPHAGOGASTRODUODENOSCOPY N/A 02/08/2024   Procedure: EGD (ESOPHAGOGASTRODUODENOSCOPY);  Surgeon: Charlanne Groom, MD;  Location: [facility];  Service: Gastroenterology;  Laterality: N/A;   EXPLORATORY LAPAROTOMY     OTHER SURGICAL HISTORY  09/2017   had cyst removal from spine    PERCUTANEOUS CORONARY STENT INTERVENTION (PCI-S)  08/2017   done in [place]      REMOVAL OF BILATERAL TISSUE EXPANDERS WITH PLACEMENT OF BILATERAL BREAST IMPLANTS Bilateral 05/17/2014   Procedure: REMOVAL OF BILATERAL TISSUE EXPANDERS WITH PLACEMENT OF BILATERAL BREAST IMPLANTS FOR RECONSTRUCTION;  Surgeon: Alm Sick, MD;  Location: [facility] OR;  Service: Plastics;  Laterality: Bilateral;   REVERSE SHOULDER ARTHROPLASTY Right 05/13/2023   Procedure: REVERSE SHOULDER ARTHROPLASTY;  Surgeon: Melita Drivers, MD;  Location: [facility]S;  Service: Orthopedics;  Laterality: Right;   TONSILLECTOMY     TOTAL KNEE ARTHROPLASTY Right 07/26/2018   Procedure: TOTAL KNEE ARTHROPLASTY;  Surgeon: Ernie Cough, MD;  Location: [facility];  Service: Orthopedics;  Laterality: Right;  70 mins   TOTAL KNEE ARTHROPLASTY Left 12/02/2021   Procedure: TOTAL KNEE ARTHROPLASTY;  Surgeon: Ernie Cough, MD;  Location: [facility];  Service: Orthopedics;  Laterality: Left;   TOTAL MASTECTOMY Bilateral 05/23/2013   Procedure: TOTAL MASTECTOMY;  Surgeon: Krystal JINNY Russell, MD;  Location: [facility] OR;  Service: General;  Laterality: Bilateral;   Social History:  reports that she has quit smoking. Her smoking use included cigarettes. She started smoking about 31 years ago. She has a 31.9 pack-year smoking history. She has never used smokeless tobacco. She reports current alcohol  use of about 1.0 standard drink of alcohol  per week. She reports that she does not use drugs.  Allergies  Allergen Reactions   Iodine  Hives and Swelling    Other Reaction(s): Unknown    Pseudoeph-Hydrocodone -Gg Other (See Comments)    hallucinations   Ivp Dye [Iodinated Contrast Media] Hives   Methocarbamol  Other (See Comments)    NO MUSCLE RELAXER'S  Hallucinations   Oxycodone  Other (See Comments)    hallucinations   Shellfish-Derived Products     Other Reaction(s): Unknown   Sulfa Antibiotics Swelling   Tape Other (See Comments)     tears skin ; ok to use paper tape     Family History  Problem Relation Age of Onset   Breast cancer Mother        possible inflammatory breast cancer   Heart attack Father    Heart disease Father    ALS Sister    Breast cancer Maternal Grandmother 62   Heart attack Maternal Grandfather    Other Paternal Grandmother        ruptured appendix   Heart attack Paternal Grandfather    Breast cancer Maternal Aunt        dx in her 76s  Breast cancer Other        maternal great grandmother; dx in her 50s   Thyroid  disease Daughter      Prior to Admission medications   Medication Sig Start Date End Date Taking? Authorizing Provider  allopurinol  (ZYLOPRIM ) 100 MG tablet TAKE 1 TABLET BY MOUTH ONCE DAILY 06/25/16   Magrinat, Sandria BROCKS, MD  Budeson-Glycopyrrol-Formoterol  (BREZTRI  AEROSPHERE) 160-9-4.8 MCG/ACT AERO Inhale 2 puffs into the lungs in the morning and at bedtime. Patient taking differently: Inhale 2 puffs into the lungs as needed (wheezing/SOB). 06/19/22   Darlean Ozell NOVAK, MD  Cholecalciferol  (VITAMIN D3) 50 MCG (2000 UT) TABS Take 2,000 Units by mouth daily.    [provider]  clopidogrel  (PLAVIX ) 75 MG tablet TAKE 1 TABLET(75 MG) BY MOUTH DAILY 07/08/23   Wonda Ozell, MD  cyanocobalamin  (,VITAMIN B-12,) 1000 MCG/ML injection Inject 1,000 mcg into the muscle every 30 (thirty) days.    [provider]  estradiol (ESTRACE) 0.1 MG/GM vaginal cream USE 1 GIVE INTO THE VAGINA EVERY NIGHT AT BEDTIME FOR 14 DAYS THEN TWICE A WEEK THEREAFTER    [provider]  fluticasone  (FLONASE ) 50 MCG/ACT nasal  spray Place 2 sprays into both nostrils daily. 01/18/13   [provider]  folic acid  (FOLVITE ) 1 MG tablet Take 1 mg by mouth daily.    [provider]  furosemide  (LASIX ) 40 MG tablet Take 40 mg by mouth daily as needed for fluid.  04/01/13   [provider]  Hydroxychloroquine  Sulfate 300 MG TABS Take 1 tablet by mouth daily.    [provider]  ketoconazole  (NIZORAL ) 2 % shampoo Apply 1 Application topically as needed. 08/24/23   [provider]  lidocaine  (LIDODERM ) 5 % Place 1 patch onto the skin daily. Remove & Discard patch within 12 hours or as directed by MD 12/25/23   Neysa Caron PARAS, DO  loratadine  (CLARITIN ) 10 MG tablet Take 10 mg by mouth daily.    [provider]  metoprolol  succinate (TOPROL -XL) 25 MG 24 hr tablet TAKE 1 TABLET(25 MG) BY MOUTH DAILY 12/10/23   Cooper, Michael, MD  Multiple Vitamin (MULTI-VITAMIN) tablet Take 1 tablet by mouth daily.    [provider]  MYRBETRIQ 50 MG TB24 tablet Take 1 tablet by mouth daily. Hold for 7 days , 01-03-2024.    [provider]  nitroGLYCERIN  (NITROSTAT ) 0.4 MG SL tablet Place 1 tablet (0.4 mg total) under the tongue every 5 (five) minutes as needed for chest pain. 09/29/23   Wonda Ozell, MD  ORENCIA CLICKJECT 125 MG/ML SOAJ Inject 125 mg into the skin once a week. 12/13/23   [provider]  pantoprazole  (PROTONIX ) 40 MG tablet Take 1 tablet (40 mg total) by mouth 2 (two) times daily. 12/17/23   Charlanne Groom, MD  polyethylene glycol (MIRALAX  / GLYCOLAX ) 17 g packet Take 17 g by mouth daily as needed for mild constipation. 12/03/21   Patti Rosina SAUNDERS, PA-C  predniSONE  (DELTASONE ) 5 MG tablet Take 5 mg by mouth daily with breakfast.    [provider]  Propylene Glycol (SYSTANE COMPLETE) 0.6 % SOLN Place 2 drops into both eyes 2 (two) times daily.    [provider]  rosuvastatin  (CRESTOR ) 20 MG tablet Take 1 tablet (20 mg total) by mouth daily.  12/10/23   Wonda Ozell, MD  sertraline  (ZOLOFT ) 100 MG tablet Take 0.5 tablets (50 mg total) by mouth daily. 12/27/23   Dohmeier, Dedra, MD  testosterone  cypionate (DEPOTESTOSTERONE CYPIONATE)  200 MG/ML injection Inject 200 mg into the muscle every 30 (thirty) days. 01/17/19   [provider]  traMADol  (ULTRAM ) 50 MG tablet Take 100 mg by mouth 2 (two) times daily.    [provider]    Physical Exam: BP (!) 146/63   Pulse (!) 57   Temp 98.3 F (36.8 C) (Oral)   Resp 16   LMP 08/17/1977 (Approximate)   SpO2 100%  General:  Alert, oriented, calm, in no acute distress  Eyes: EOMI, clear conjuctivae, white sclerea Neck: supple, no masses, trachea mildline  Cardiovascular: RRR, no murmurs or rubs, no peripheral edema  Respiratory: clear to auscultation bilaterally, no wheezes, no crackles  Abdomen: soft, nontender, nondistended, normal bowel tones heard  Skin: dry, no rashes  Musculoskeletal: no joint effusions, normal range of motion  Psychiatric: appropriate affect, normal speech  Neurologic: extraocular muscles intact, clear speech, moving all extremities with intact sensorium         Labs on Admission:  Basic Metabolic Panel: Recent Labs  Lab 05/15/24 1102  NA 142  K 3.9  CL 107  CO2 22  GLUCOSE 90  BUN 22  CREATININE 1.19*  CALCIUM  9.5   Liver Function Tests: Recent Labs  Lab 05/15/24 1243  AST 28  ALT 17  ALKPHOS 55  BILITOT 0.2  PROT 5.7*  ALBUMIN 3.8   Recent Labs  Lab 05/15/24 1243  LIPASE 40   No results for input(s): AMMONIA in the last 168 hours. CBC: Recent Labs  Lab 05/15/24 1102  WBC 6.2  HGB 8.5*  HCT 27.3*  MCV 90.7  PLT 214   Cardiac Enzymes: No results for input(s): CKTOTAL, CKMB, CKMBINDEX, TROPONINI in the last 168 hours. BNP (last 3 results) No results for input(s): BNP in the last 8760 hours.  ProBNP (last 3 results) No results for input(s): PROBNP in the last 8760 hours.  CBG: No results  for input(s): GLUCAP in the last 168 hours.  Radiological Exams on Admission: DG Chest 2 View Result Date: 05/15/2024 CLINICAL DATA:  Chest pain. EXAM: CHEST - 2 VIEW COMPARISON:  12/25/2023 FINDINGS: The heart size and mediastinal contours are within normal limits. Both lungs are clear. Thoracic spine degenerative changes and right shoulder prosthesis again noted. IMPRESSION: No active cardiopulmonary disease. Electronically Signed   By: Norleen DELENA Kil M.D.   On: 05/15/2024 11:32   Assessment/Plan Tracey [name] is a 78 y.o. female with medical history significant for CAD on Plavix , polymyalgia rheumatica, breast cancer, GERD, history of Schatzki's ring status post dilation June 2025 being admitted to the hospital with several days of dyspnea with exertion, chest discomfort and melanotic stools for the last 4 days found to have acute on chronic anemia and concern for upper GI bleed.  Acute on chronic blood loss anemia, suspected upper GI bleed-patient presents with symptomatic anemia, melena.  She does have a history of chronic iron deficiency anemia, and is intolerant of iron replacement.  Baseline hemoglobin appears to be about 10.  She does take Plavix  daily. -Inpatient admission -Monitor closely on progressive -Avoid blood thinners -Trend hemoglobin every 8 hours -Transfuse as necessary to keep hemoglobin greater than 7 -IV PPI twice daily -Patient has been seen in consultation by [facility]  Polymyalgia rheumatica-continue home Plaquenil  and prednisone  once dosing is confirmed  CAD-patient presents with complaints of chest pressure and discomfort, EKG nonacute and troponin negative x 2.  Will continue to monitor on telemetry, and continue Toprol -XL  DVT prophylaxis: SCDs only  Code Status: Full Code  Consults called: [facility]  Admission status: The appropriate patient status for this patient is INPATIENT. Inpatient status is judged to be reasonable and necessary in order to  provide the required intensity of service to ensure the patient's safety. The patient's presenting symptoms, physical exam findings, and initial radiographic and laboratory data in the context of their chronic comorbidities is felt to place them at high risk for further clinical deterioration. Furthermore, it is not anticipated that the patient will be medically stable for discharge from the hospital within 2 midnights of admission.    I certify that at the point of admission it is my clinical judgment that the patient will require inpatient hospital care spanning beyond 2 midnights from the point of admission due to high intensity of service, high risk for further deterioration and high frequency of surveillance required  Time spent: 49 minutes  Gwyneth Fernandez CHRISTELLA Gail MD [facility] Pager [phone]  If 7PM-7AM, please contact night-coverage [email or web address] Password [email or web address]  05/15/2024, 1:52 PM

## 2024-05-16 ENCOUNTER — Other Ambulatory Visit: Payer: Self-pay

## 2024-05-16 DIAGNOSIS — M353 Polymyalgia rheumatica: Secondary | ICD-10-CM

## 2024-05-16 DIAGNOSIS — K922 Gastrointestinal hemorrhage, unspecified: Secondary | ICD-10-CM | POA: Diagnosis not present

## 2024-05-16 DIAGNOSIS — K921 Melena: Secondary | ICD-10-CM | POA: Diagnosis not present

## 2024-05-16 DIAGNOSIS — I2510 Atherosclerotic heart disease of native coronary artery without angina pectoris: Secondary | ICD-10-CM

## 2024-05-16 DIAGNOSIS — D509 Iron deficiency anemia, unspecified: Secondary | ICD-10-CM | POA: Diagnosis not present

## 2024-05-16 DIAGNOSIS — D62 Acute posthemorrhagic anemia: Secondary | ICD-10-CM | POA: Diagnosis not present

## 2024-05-16 DIAGNOSIS — K254 Chronic or unspecified gastric ulcer with hemorrhage: Secondary | ICD-10-CM | POA: Diagnosis not present

## 2024-05-16 LAB — CBC
HCT: 25.3 % — ABNORMAL LOW (ref 36.0–46.0)
Hemoglobin: 7.9 g/dL — ABNORMAL LOW (ref 12.0–15.0)
MCH: 28.2 pg (ref 26.0–34.0)
MCHC: 31.2 g/dL (ref 30.0–36.0)
MCV: 90.4 fL (ref 80.0–100.0)
Platelets: 213 K/uL (ref 150–400)
RBC: 2.8 MIL/uL — ABNORMAL LOW (ref 3.87–5.11)
RDW: 15.7 % — ABNORMAL HIGH (ref 11.5–15.5)
WBC: 5.4 K/uL (ref 4.0–10.5)
nRBC: 0 % (ref 0.0–0.2)

## 2024-05-16 LAB — IRON AND TIBC
Iron: 17 ug/dL — ABNORMAL LOW (ref 28–170)
Saturation Ratios: 4 % — ABNORMAL LOW (ref 10.4–31.8)
TIBC: 391 ug/dL (ref 250–450)
UIBC: 373 ug/dL

## 2024-05-16 LAB — BASIC METABOLIC PANEL WITH GFR
Anion gap: 12 (ref 5–15)
BUN: 17 mg/dL (ref 8–23)
CO2: 22 mmol/L (ref 22–32)
Calcium: 9.1 mg/dL (ref 8.9–10.3)
Chloride: 110 mmol/L (ref 98–111)
Creatinine, Ser: 1.12 mg/dL — ABNORMAL HIGH (ref 0.44–1.00)
GFR, Estimated: 50 mL/min — ABNORMAL LOW (ref >60)
Glucose, Bld: 89 mg/dL (ref 70–99)
Potassium: 4 mmol/L (ref 3.5–5.1)
Sodium: 144 mmol/L (ref 135–145)

## 2024-05-16 LAB — FOLATE: Folate: >20 ng/mL (ref >5.9)

## 2024-05-16 LAB — VITAMIN B12: Vitamin B-12: 825 pg/mL (ref 180–914)

## 2024-05-16 LAB — RETICULOCYTES
Immature Retic Fract: 18.2 % — ABNORMAL HIGH (ref 2.3–15.9)
RBC.: 2.79 MIL/uL — ABNORMAL LOW (ref 3.87–5.11)
Retic Count, Absolute: 73.7 K/uL (ref 19.0–186.0)
Retic Ct Pct: 2.6 % (ref 0.4–3.1)

## 2024-05-16 LAB — FERRITIN: Ferritin: 27 ng/mL (ref 11–307)

## 2024-05-16 LAB — HEMOGLOBIN AND HEMATOCRIT, BLOOD
HCT: 26 % — ABNORMAL LOW (ref 36.0–46.0)
Hemoglobin: 7.8 g/dL — ABNORMAL LOW (ref 12.0–15.0)

## 2024-05-16 MED ORDER — ROSUVASTATIN CALCIUM 20 MG PO TABS
20.0000 mg | ORAL_TABLET | Freq: Every day | ORAL | Status: DC
  Administered 2024-05-16 – 2024-05-18 (×2): 20 mg via ORAL
  Filled 2024-05-16 (×2): qty 1

## 2024-05-16 MED ORDER — HYDROXYCHLOROQUINE SULFATE 200 MG PO TABS
300.0000 mg | ORAL_TABLET | Freq: Every day | ORAL | Status: DC
  Administered 2024-05-16 – 2024-05-18 (×2): 300 mg via ORAL
  Filled 2024-05-16 (×3): qty 1.5

## 2024-05-16 MED ORDER — SERTRALINE HCL 50 MG PO TABS
50.0000 mg | ORAL_TABLET | Freq: Every day | ORAL | Status: DC
  Administered 2024-05-16 – 2024-05-18 (×2): 50 mg via ORAL
  Filled 2024-05-16 (×2): qty 1

## 2024-05-16 MED ORDER — FOLIC ACID 1 MG PO TABS
1.0000 mg | ORAL_TABLET | Freq: Every day | ORAL | Status: DC
  Administered 2024-05-18: 1 mg via ORAL
  Filled 2024-05-16: qty 1

## 2024-05-16 MED ORDER — ORAL CARE MOUTH RINSE: 15.0000 mL | OROMUCOSAL | Status: DC | PRN

## 2024-05-16 MED ORDER — FLUTICASONE PROPIONATE 50 MCG/ACT NA SUSP
2.0000 | Freq: Every day | NASAL | Status: DC
  Administered 2024-05-16 – 2024-05-18 (×2): 2 via NASAL
  Filled 2024-05-16: qty 16

## 2024-05-16 MED ORDER — MIRABEGRON ER 25 MG PO TB24
50.0000 mg | ORAL_TABLET | Freq: Every day | ORAL | Status: DC
  Administered 2024-05-18: 50 mg via ORAL
  Filled 2024-05-16 (×3): qty 2

## 2024-05-16 MED ORDER — ALLOPURINOL 100 MG PO TABS
100.0000 mg | ORAL_TABLET | Freq: Every day | ORAL | Status: DC
  Administered 2024-05-18: 100 mg via ORAL
  Filled 2024-05-16: qty 1

## 2024-05-16 MED ORDER — PREDNISONE 1 MG PO TABS
2.0000 mg | ORAL_TABLET | Freq: Every day | ORAL | Status: DC
  Administered 2024-05-18: 2 mg via ORAL
  Filled 2024-05-16 (×2): qty 2

## 2024-05-16 MED ORDER — VITAMIN D 25 MCG (1000 UNIT) PO TABS
2000.0000 [IU] | ORAL_TABLET | Freq: Every day | ORAL | Status: DC
  Administered 2024-05-18: 2000 [IU] via ORAL
  Filled 2024-05-16: qty 2

## 2024-05-16 MED ORDER — SODIUM CHLORIDE 0.9 % IV SOLN: INTRAVENOUS | Status: AC

## 2024-05-16 MED ORDER — METOPROLOL SUCCINATE ER 25 MG PO TB24
25.0000 mg | ORAL_TABLET | Freq: Every day | ORAL | Status: DC
  Administered 2024-05-16 – 2024-05-18 (×2): 25 mg via ORAL
  Filled 2024-05-16 (×2): qty 1

## 2024-05-16 NOTE — ED Notes (Signed)
 Pt had a BM that was green and solid to runny. Pt advised she felt immediate relief after the BM.

## 2024-05-16 NOTE — Progress Notes (Signed)
 Progress Note   Subjective  Hospital day #2 Chief Complaint: GI bleed, anemia  Today, patient is unfortunately still down in the ER waiting for a room.  Her last bowel it was around 2:00 in the morning, preceded by what sounds like a panic attack with a feeling of chest tightness and heaviness prior, all relieved with a very large bowel movement per family.  Per nursing staff this was green initially and then a normal color and looser after.  She feels well now.  Aware of plans for endoscopic workup tomorrow.   Objective   Vital signs in last 24 hours: Temp:  [97.9 F (36.6 C)-98.3 F (36.8 C)] 97.9 F (36.6 C) (09/30 0321) Pulse Rate:  [57-68] 68 (09/30 0630) Resp:  [10-19] 16 (09/30 0630) BP: (115-146)/(40-103) 115/40 (09/30 0630) SpO2:  [97 %-100 %] 98 % (09/30 0630)   General:  elderly white female in NAD Heart:  Regular rate and rhythm; no murmurs Lungs: Respirations even and unlabored, lungs CTA bilaterally Abdomen:  Soft, nontender and nondistended. Normal bowel sounds. Psych:  Cooperative. Normal mood and affect.  Intake/Output from previous day: 09/29 0701 - 09/30 0700 In: 505.8 [IV Piggyback:505.8] Out: -    Lab Results: Recent Labs    05/15/24 1102 05/15/24 1434 05/15/24 2133 05/16/24 0433  WBC 6.2  --   --  5.4  HGB 8.5* 7.4* 7.4* 7.9*  HCT 27.3* 24.2* 23.6* 25.3*  PLT 214  --   --  213   BMET Recent Labs    05/15/24 1102 05/16/24 0433  NA 142 144  K 3.9 4.0  CL 107 110  CO2 22 22  GLUCOSE 90 89  BUN 22 17  CREATININE 1.19* 1.12*  CALCIUM  9.5 9.1   LFT Recent Labs    05/15/24 1243  PROT 5.7*  ALBUMIN 3.8  AST 28  ALT 17  ALKPHOS 55  BILITOT 0.2  BILIDIR 0.1  IBILI 0.1*   Studies/Results: DG Chest 2 View Result Date: 05/15/2024 CLINICAL DATA:  Chest pain. EXAM: CHEST - 2 VIEW COMPARISON:  12/25/2023 FINDINGS: The heart size and mediastinal contours are within normal limits. Both lungs are clear. Thoracic spine degenerative  changes and right shoulder prosthesis again noted. IMPRESSION: No active cardiopulmonary disease. Electronically Signed   By: Norleen DELENA Kil M.D.   On: 05/15/2024 11:32     Assessment / Plan:    Tracey [name] is a 78 year old female with a past medical history of CAD on Plavix  (last dose 05/14/2024 AM), PMR, breast cancer, OSA who was initially admitted with chest pain, weakness, shortness of breath, dysphagia and melena.  Symptoms started on Wednesday evening, 05/10/2024 abruptly and has been having melena since with her last bowel movement around 2:00 AM on 05/15/2024.  Hemoglobin stable overnight.  Assessment: 1.  Dysphagia/atypical chest pain/melena:Dysphagia initially resolved after EGD with dilation of Schatzki's ring in June, better for a month or 2, then symptoms came back towards liquids and solids describing a spasm as well, 5 days ago had increased chest discomfort and weakness with an episode of vomiting and proceeded to have melenic stools which have continued daily 1-2 times a day with her last yesterday morning, on Plavix  with her last dose yesterday 05/14/2024, at time of last EGD no Cameron erosions, but a 5 cm hiatal hernia, previously iron deficient documented back in May, unable to take iron supplements per family due to constipation, last colonoscopy 5 to 6 years ago per Dr. Ira notes (cannot see  procedure), hgb 11.9 on 04/05/24-->8.5-->7.4-->7.9 ; consider most likely upper GI bleed from GERD/gastritis, duodenitis, Cameron's erosions, angioectasias in the upper GI tract or less likely distal small bowel or right colon 2.  GERD with history of Schatzki's ring:Last dilated in June via EGD, maintained on Pantoprazole  40mg  every day see above does continue to have symptoms 3.  CAD on Plavix : Last dose 05/14/2024 AM 4.  Rheumatoid arthritis: Follows with [facility] rheumatology for PMR on Orencia since February 5.  History of breast cancer: Status post bilateral mastectomy and adjuvant  radiation, completed 5 years of antiestrogen therapy in 2019 6.  B12 deficiency: On monthly injections 7.  Chronic constipation: Typically takes states in a mixture of stool softeners or to have more normal normal bowel movements 8.  Iron deficiency anemia: Since at least May of this year, being followed by PCP but able to tolerate oral iron; consider relation to current bleed   Plan: 1.  Plan for EGD and enteroscopy tomorrow afternoon with Dr. Suzann for further evaluation of her upper GI bleed after 72 hours of Plavix  washout.  Previously discussed risks, benefits, limitations and alternatives and the patient agrees to proceed. 2.  Continue to monitor hemoglobin with transfusion as needed per hospitalist recommendations. 3.  Would recommend a dose of IV iron while here and likely some to follow given that she cannot tolerate oral iron as an outpatient 4.  Patient placed back on a clear liquid diet today and will be n.p.o. at midnight 5.  Continue Pantoprazole  40 mg IV twice daily 6.  Continue to hold Plavix  7.  Also family asked about patient's home medications, none of these have been started in the ER.  She is requesting her tramadol   Thank you for your kind consultation, we will continue to follow.    LOS: 1 day   [name]  05/16/2024, 10:16 AM

## 2024-05-16 NOTE — Care Management Obs Status (Signed)
 MEDICARE OBSERVATION STATUS NOTIFICATION   Patient Details  Name: Tracey [name] MRN: [id number] Date of Birth: 1946/01/31   Medicare Observation Status Notification Given:  Yes    Kari JONETTA Daisy, LCSW 05/16/2024, 11:52 AM

## 2024-05-16 NOTE — Hospital Course (Signed)
 Tracey [name] is a 78 y.o. female with a history of CAD, polymyalgia rheumatica, breast cancer, GERD, Schatzki's ring s/p dilation.  Patient presented secondary to chest discomfort and black stools consistent with melena and GI bleed. Chest pain resolved with a large bowel movement. GI consulted and plan endoscopic evaluation.

## 2024-05-16 NOTE — Progress Notes (Signed)
 PROGRESS NOTE    Tracey [name]  FMW:[id number] DOB: 30-Dec-1945 DOA: 05/15/2024 PCP: Larnell Hamilton, MD   Brief Narrative: Tracey [name] is a 78 y.o. female with a history of CAD, polymyalgia rheumatica, breast cancer, GERD, Schatzki's ring s/p dilation.  Patient presented secondary to chest discomfort and black stools consistent with melena and GI bleed. Chest pain resolved with a large bowel movement. GI consulted and plan endoscopic evaluation.   Assessment and Plan:  Melena Black stools with FOBT positive test. Gastroenterology consulted with plan for upper endoscopy. Complicated by Plavix  use. -Hold Plavix  -GI recommendations: plan for upper endoscopy evaluation tomorrow  Acute on chronic blood loss anemia Iron deficiency anemia Patient is on iron supplementation as an outpatient. Last iron panel from one year prior. Baseline hemoglobin of 9-10.   Polymyalgia rheumatica -Continue hydroxychloroquine  and prednisone   Gout Continue allopurinol   CAD Patient is on Crestor  and Plavix  as an outpatient. Plavix  held on admission. -Continue Crestor   Chest pain Appears to have been GI related. Resolved after a large bowel movement.    DVT prophylaxis: SCDs Code Status:   Code Status: Full Code Family Communication: Husband at bedside Disposition Plan: Discharge home likely in 24 hours after GI workup and stable hemoglobin   Consultants:  Gastroenterology  Procedures:  None  Antimicrobials: None    Subjective: Large bowel movement overnight with melenotic appearing stool. Subsequent bowel movement with brown stool.  Objective: BP (!) 130/54   Pulse 63   Temp 97.9 F (36.6 C) (Oral)   Resp 18   Ht 5' 2 (1.575 m)   Wt 65.9 kg   LMP 08/17/1977 (Approximate)   SpO2 100%   BMI 26.56 kg/m   Examination:  General exam: Appears calm and comfortable Respiratory system: Clear to auscultation. Respiratory effort normal. Cardiovascular system: S1 & S2 heard,  RRR. Gastrointestinal system: Abdomen is nondistended, soft and nontender. Normal bowel sounds heard. Central nervous system: Alert and oriented. No focal neurological deficits. Musculoskeletal: No edema. No calf tenderness Psychiatry: Judgement and insight appear normal. Mood & affect appropriate.    Data Reviewed: I have personally reviewed following labs and imaging studies  CBC Lab Results  Component Value Date   WBC 5.4 05/16/2024   RBC 2.80 (L) 05/16/2024   HGB 7.9 (L) 05/16/2024   HCT 25.3 (L) 05/16/2024   MCV 90.4 05/16/2024   MCH 28.2 05/16/2024   PLT 213 05/16/2024   MCHC 31.2 05/16/2024   RDW 15.7 (H) 05/16/2024   LYMPHSABS 1.6 05/19/2023   MONOABS 0.8 05/19/2023   EOSABS 0.2 05/19/2023   BASOSABS 0.0 05/19/2023     Last metabolic panel Lab Results  Component Value Date   NA 144 05/16/2024   K 4.0 05/16/2024   CL 110 05/16/2024   CO2 22 05/16/2024   BUN 17 05/16/2024   CREATININE 1.12 (H) 05/16/2024   GLUCOSE 89 05/16/2024   GFRNONAA 50 (L) 05/16/2024   GFRAA 45 (L) 03/28/2020   CALCIUM  9.1 05/16/2024   PROT 5.7 (L) 05/15/2024   ALBUMIN 3.8 05/15/2024   LABGLOB 2.3 01/20/2018   AGRATIO 1.9 01/20/2018   BILITOT 0.2 05/15/2024   ALKPHOS 55 05/15/2024   AST 28 05/15/2024   ALT 17 05/15/2024   ANIONGAP 12 05/16/2024    GFR: Estimated Creatinine Clearance: 36.3 mL/min (A) (by C-G formula based on SCr of 1.12 mg/dL (H)).  No results found for this or any previous visit (from the past 240 hours).    Radiology Studies: DG  Chest 2 View Result Date: 05/15/2024 CLINICAL DATA:  Chest pain. EXAM: CHEST - 2 VIEW COMPARISON:  12/25/2023 FINDINGS: The heart size and mediastinal contours are within normal limits. Both lungs are clear. Thoracic spine degenerative changes and right shoulder prosthesis again noted. IMPRESSION: No active cardiopulmonary disease. Electronically Signed   By: Norleen DELENA Kil M.D.   On: 05/15/2024 11:32      LOS: 1 day    Elgin Lam, MD [facility] 05/16/2024, 3:47 PM   If 7PM-7AM, please contact night-coverage [email or web address]

## 2024-05-16 NOTE — Care Management CC44 (Signed)
 Condition Code 44 Documentation Completed  Patient Details  Name: Tracey [name] MRN: [id number] Date of Birth: 1946-05-25   Condition Code 44 given:  Yes Patient signature on Condition Code 44 notice:  Yes Documentation of 2 MD's agreement:  Yes Code 44 added to claim:  Yes    Kari JONETTA Daisy, LCSW 05/16/2024, 11:52 AM

## 2024-05-16 NOTE — Progress Notes (Signed)
 Per patient, had a small brown formed BM. No blood noted.

## 2024-05-17 ENCOUNTER — Observation Stay (HOSPITAL_COMMUNITY): Admitting: Anesthesiology

## 2024-05-17 ENCOUNTER — Encounter (HOSPITAL_COMMUNITY)
Admission: EM | Disposition: A | Discharge status: Home / Self Care | Payer: Self-pay | Source: Home / Self Care | Attending: Emergency Medicine

## 2024-05-17 ENCOUNTER — Telehealth: Payer: Self-pay | Admitting: Pharmacy Technician

## 2024-05-17 DIAGNOSIS — I2510 Atherosclerotic heart disease of native coronary artery without angina pectoris: Secondary | ICD-10-CM | POA: Diagnosis not present

## 2024-05-17 DIAGNOSIS — M353 Polymyalgia rheumatica: Secondary | ICD-10-CM | POA: Diagnosis not present

## 2024-05-17 DIAGNOSIS — I129 Hypertensive chronic kidney disease with stage 1 through stage 4 chronic kidney disease, or unspecified chronic kidney disease: Secondary | ICD-10-CM

## 2024-05-17 DIAGNOSIS — K449 Diaphragmatic hernia without obstruction or gangrene: Secondary | ICD-10-CM | POA: Diagnosis not present

## 2024-05-17 DIAGNOSIS — Q399 Congenital malformation of esophagus, unspecified: Secondary | ICD-10-CM | POA: Diagnosis not present

## 2024-05-17 DIAGNOSIS — K222 Esophageal obstruction: Secondary | ICD-10-CM

## 2024-05-17 DIAGNOSIS — K3189 Other diseases of stomach and duodenum: Secondary | ICD-10-CM | POA: Diagnosis not present

## 2024-05-17 DIAGNOSIS — N1832 Chronic kidney disease, stage 3b: Secondary | ICD-10-CM

## 2024-05-17 DIAGNOSIS — K259 Gastric ulcer, unspecified as acute or chronic, without hemorrhage or perforation: Secondary | ICD-10-CM

## 2024-05-17 DIAGNOSIS — K922 Gastrointestinal hemorrhage, unspecified: Secondary | ICD-10-CM | POA: Diagnosis not present

## 2024-05-17 DIAGNOSIS — K254 Chronic or unspecified gastric ulcer with hemorrhage: Secondary | ICD-10-CM | POA: Diagnosis not present

## 2024-05-17 DIAGNOSIS — K921 Melena: Secondary | ICD-10-CM | POA: Diagnosis not present

## 2024-05-17 DIAGNOSIS — D62 Acute posthemorrhagic anemia: Secondary | ICD-10-CM | POA: Diagnosis not present

## 2024-05-17 HISTORY — PX: ESOPHAGOGASTRODUODENOSCOPY: SHX5428

## 2024-05-17 LAB — HEMOGLOBIN AND HEMATOCRIT, BLOOD
HCT: 27.1 % — ABNORMAL LOW (ref 36.0–46.0)
HCT: 26.6 % — ABNORMAL LOW (ref 36.0–46.0)
Hemoglobin: 8.2 g/dL — ABNORMAL LOW (ref 12.0–15.0)
Hemoglobin: 8.2 g/dL — ABNORMAL LOW (ref 12.0–15.0)

## 2024-05-17 SURGERY — EGD (ESOPHAGOGASTRODUODENOSCOPY)
Anesthesia: Monitor Anesthesia Care

## 2024-05-17 MED ORDER — SODIUM CHLORIDE 0.9 % IV SOLN
INTRAVENOUS | Status: AC | PRN
Start: 2024-05-17 — End: 2024-05-17
  Administered 2024-05-17: 500 mL via INTRAMUSCULAR

## 2024-05-17 MED ORDER — PROPOFOL 1000 MG/100ML IV EMUL
INTRAVENOUS | Status: AC
  Filled 2024-05-17: qty 100

## 2024-05-17 MED ORDER — PROPOFOL 1000 MG/100ML IV EMUL
INTRAVENOUS | Status: AC
  Filled 2024-05-17: qty 200

## 2024-05-17 MED ORDER — ONDANSETRON HCL 4 MG/2ML IJ SOLN
INTRAMUSCULAR | Status: DC | PRN
  Administered 2024-05-17: 4 mg via INTRAVENOUS

## 2024-05-17 MED ORDER — LIDOCAINE 2% (20 MG/ML) 5 ML SYRINGE
INTRAMUSCULAR | Status: DC | PRN
  Administered 2024-05-17: 60 mg via INTRAVENOUS

## 2024-05-17 MED ORDER — CLOPIDOGREL BISULFATE 75 MG PO TABS
75.0000 mg | ORAL_TABLET | Freq: Every day | ORAL | Status: DC
  Administered 2024-05-17 – 2024-05-18 (×2): 75 mg via ORAL
  Filled 2024-05-17 (×2): qty 1

## 2024-05-17 MED ORDER — PROPOFOL 10 MG/ML IV BOLUS
INTRAVENOUS | Status: DC | PRN
  Administered 2024-05-17: 30 mg via INTRAVENOUS
  Administered 2024-05-17: 70 mg via INTRAVENOUS
  Administered 2024-05-17: 125 ug/kg/min via INTRAVENOUS
  Administered 2024-05-17: 30 mg via INTRAVENOUS

## 2024-05-17 MED ORDER — EPHEDRINE SULFATE-NACL 50-0.9 MG/10ML-% IV SOSY
PREFILLED_SYRINGE | INTRAVENOUS | Status: DC | PRN
Start: 2024-05-17 — End: 2024-05-17
  Administered 2024-05-17: 5 mg via INTRAVENOUS

## 2024-05-17 MED ORDER — PHENYLEPHRINE 80 MCG/ML (10ML) SYRINGE FOR IV PUSH (FOR BLOOD PRESSURE SUPPORT)
PREFILLED_SYRINGE | INTRAVENOUS | Status: DC | PRN
Start: 2024-05-17 — End: 2024-05-17
  Administered 2024-05-17 (×4): 80 ug via INTRAVENOUS

## 2024-05-17 MED ORDER — ONDANSETRON HCL 4 MG/2ML IJ SOLN: 4.0000 mg | Freq: Once | INTRAMUSCULAR | Status: DC | PRN

## 2024-05-17 NOTE — Op Note (Signed)
 [facility] Patient Name: Tracey [name] Procedure Date: 05/17/2024 MRN: [id number] Attending MD: Inocente Hausen , MD, [id number] Date of Birth: 1946-01-25 CSN: [id number] Age: 78 Admit Type: Inpatient Procedure:                Upper GI endoscopy Indications:              Melena, Acute post hemorrhagic anemia Providers:                Inocente Hausen, MD, Randall Lines, RN, Fairy Marina, Technician Referring MD:              Medicines:                Monitored Anesthesia Care Complications:            No immediate complications. Estimated blood loss:                            None. Estimated Blood Loss:     Estimated blood loss: none. Procedure:                Pre-Anesthesia Assessment:                           - Prior to the procedure, a History and Physical                            was performed, and patient medications and                            allergies were reviewed. The patient's tolerance of                            previous anesthesia was also reviewed. The risks                            and benefits of the procedure and the sedation                            options and risks were discussed with the patient.                            All questions were answered, and informed consent                            was obtained. Prior Anticoagulants: The patient has                            taken Plavix  (clopidogrel ), last dose was 3 days                            prior to procedure. ASA Grade Assessment: III - A  patient with severe systemic disease. After                            reviewing the risks and benefits, the patient was                            deemed in satisfactory condition to undergo the                            procedure.                           After obtaining informed consent, the endoscope was                            passed under direct vision. Throughout the                             procedure, the patient's blood pressure, pulse, and                            oxygen  saturations were monitored continuously. The                            [id number]-[id number] ([id number]) Olympus endoscope was introduced                            through the mouth, and advanced to the third part                            of duodenum. After obtaining informed consent, the                            endoscope was passed under direct vision.                            Throughout the procedure, the patient's blood                            pressure, pulse, and oxygen  saturations were                            monitored continuously. The upper GI endoscopy was                            accomplished without difficulty. The patient                            tolerated the procedure well. Scope In: Scope Out: Findings:      The upper third of the esophagus and middle third of the esophagus were       normal.      The lower third of the esophagus was moderately tortuous.      A non-obstructing and moderate Schatzki ring was found at the  gastroesophageal junction.      One non-bleeding cratered gastric ulcer with a clean ulcer base (Forrest       Class III) was found in the gastric body. The lesion was 3 mm in largest       dimension. For hemostasis, two hemostatic clips were successfully       placed. 3mL of Purastat applied as an additional measure to preclude       future bleeding      A single localized erosion with no bleeding (Cameron's erosion) and no       stigmata of recent bleeding was found in the gastric fundus.      A medium-sized hiatal hernia was present.      Localized mildly erythematous mucosa without bleeding was found in the       prepyloric region of the stomach.      The duodenal bulb, second portion of the duodenum and third portion of       the duodenum were normal. Impression:               - Normal upper third of esophagus and middle third                             of esophagus.                           - Tortuous esophagus.                           - Non-obstructing and moderate Schatzki ring. This                            was not dilated during today's exam as patient had                            not completed a 5-day Plavix  washout.                           - Non-bleeding gastric ulcer with a clean ulcer                            base (Forrest Class III). Clips were placed.                            Purastat applied. Suspect this was the source of                            previous bleeding.                           - Single Cameron's erosion with no bleeding and no                            stigmata of recent bleeding.                           - Medium-sized hiatal hernia.                           -  Erythematous mucosa in the prepyloric region of                            the stomach.                           - Normal duodenal bulb, second portion of the                            duodenum and third portion of the duodenum.                           - No specimens collected. Moderate Sedation:      Not Applicable - Patient had care per Anesthesia. Recommendation:           - Return patient to hospital ward for ongoing care.                           - Continue Protonix  40 mg IV twice daily. At the                            time of discharge convert to Protonix  40 mg p.o.                            twice daily.                           - Continue to monitor serial hemoglobin and                            hematocrit.                           - In the absence of overt ongoing bleeding may                            resume Plavix  and monitor for recurrence of                            bleeding.                           - May resume regular diet.                           - In the setting of gastric ulceration recommend                            NSAID avoidance.                           - Patient will need  an outpatient EGD scheduled in                            8 weeks with Dr. Charlanne for confirmation of ulcer  healing and to reassess for esophageal dilation.                           - Results discussed with patient. Procedure Code(s):        --- Professional ---                           [phone], Esophagogastroduodenoscopy, flexible,                            transoral; with control of bleeding, any method Diagnosis Code(s):        --- Professional ---                           Q39.9, Congenital malformation of esophagus,                            unspecified                           K22.2, Esophageal obstruction                           K25.9, Gastric ulcer, unspecified as acute or                            chronic, without hemorrhage or perforation                           K31.89, Other diseases of stomach and duodenum                           K44.9, Diaphragmatic hernia without obstruction or                            gangrene                           K92.1, Melena (includes Hematochezia)                           D62, Acute posthemorrhagic anemia CPT copyright 2022 [employer]. All rights reserved. The codes documented in this report are preliminary and upon coder review may  be revised to meet current compliance requirements. Inocente Hausen, MD 05/17/2024 1:28:49 PM This report has been signed electronically. Number of Addenda: 0

## 2024-05-17 NOTE — Anesthesia Postprocedure Evaluation (Signed)
 Anesthesia Post Note  Patient: Tracey [name]  Procedure(s) Performed: EGD (ESOPHAGOGASTRODUODENOSCOPY)     Patient location during evaluation: PACU Anesthesia Type: MAC Level of consciousness: awake and alert and oriented Pain management: pain level controlled Vital Signs Assessment: post-procedure vital signs reviewed and stable Respiratory status: spontaneous breathing, nonlabored ventilation and respiratory function stable Cardiovascular status: stable and blood pressure returned to baseline Postop Assessment: no apparent nausea or vomiting Anesthetic complications: no   No notable events documented.  Last Vitals:  Vitals:   05/17/24 1316 05/17/24 1320  BP: (!) 120/35 (!) 121/48  Pulse: (!) 56 65  Resp: 10 (!) 32  Temp:    SpO2: 98% 98%    Last Pain:  Vitals:   05/17/24 1320  TempSrc:   PainSc: 0-No pain                 Saxton Chain A.

## 2024-05-17 NOTE — Anesthesia Procedure Notes (Signed)
 Date/Time: 05/17/2024 12:39 PM  Performed by: Para Jerelene CROME, CRNAOxygen Delivery Method: Nasal cannula Comments: OptiFlow Nasal Cannula.

## 2024-05-17 NOTE — Progress Notes (Signed)
 Progress Note   Subjective  Hospital day #3 Chief Complaint: GI Bleed, Anemia  Today, patient is doing well.  She had another bowel movement this morning, tells me it looks more brown than prior.  She is still having trouble with a headache.  Aware of plans for EGD +/- enteroscopy this afternoon.  Denies any other acute complaints or concerns.   Objective   Vital signs in last 24 hours: Temp:  [97.9 F (36.6 C)-98.7 F (37.1 C)] 98.7 F (37.1 C) (10/01 0540) Pulse Rate:  [60-81] 60 (10/01 0540) Resp:  [12-18] 12 (10/01 0745) BP: (111-141)/(43-76) 141/54 (10/01 0540) SpO2:  [98 %-100 %] 99 % (10/01 0540) Weight:  [65.9 kg] 65.9 kg (09/30 1210) Last BM Date : 05/16/24 General:    Elderly white female in NAD Heart:  Regular rate and rhythm; no murmurs Lungs: Respirations even and unlabored, lungs CTA bilaterally Abdomen:  Soft, nontender and nondistended. Normal bowel sounds. Psych:  Cooperative. Normal mood and affect.  Intake/Output from previous day: 09/30 0701 - 10/01 0700 In: 480 [P.O.:480] Out: -    Lab Results: Recent Labs    05/15/24 1102 05/15/24 1434 05/15/24 2133 05/16/24 0433 05/16/24 1735  WBC 6.2  --   --  5.4  --   HGB 8.5*   < > 7.4* 7.9* 7.8*  HCT 27.3*   < > 23.6* 25.3* 26.0*  PLT 214  --   --  213  --    < > = values in this interval not displayed.   BMET Recent Labs    05/15/24 1102 05/16/24 0433  NA 142 144  K 3.9 4.0  CL 107 110  CO2 22 22  GLUCOSE 90 89  BUN 22 17  CREATININE 1.19* 1.12*  CALCIUM  9.5 9.1   LFT Recent Labs    05/15/24 1243  PROT 5.7*  ALBUMIN 3.8  AST 28  ALT 17  ALKPHOS 55  BILITOT 0.2  BILIDIR 0.1  IBILI 0.1*   Studies/Results: DG Chest 2 View Result Date: 05/15/2024 CLINICAL DATA:  Chest pain. EXAM: CHEST - 2 VIEW COMPARISON:  12/25/2023 FINDINGS: The heart size and mediastinal contours are within normal limits. Both lungs are clear. Thoracic spine degenerative changes and right shoulder prosthesis  again noted. IMPRESSION: No active cardiopulmonary disease. Electronically Signed   By: Norleen DELENA Kil M.D.   On: 05/15/2024 11:32    Assessment / Plan:   Mrs. Bolda is a 78 year old female with a past medical history of CAD on Plavix  (last dose 05/14/2024 AM), PMR, breast cancer, OSA who was initially admitted with chest pain, weakness, shortness of breath, dysphagia and melena. Symptoms started on Wednesday evening, 05/10/2024 abruptly and has been having melena since.Hemoglobin stable overnight.   Assessment: 1.  Dysphagia/atypical chest pain/melena:Dysphagia initially resolved after EGD with dilation of Schatzki's ring in June, better for a month or 2, then symptoms came back towards liquids and solids describing a spasm as well, 5 days ago had increased chest discomfort and weakness with an episode of vomiting and proceeded to have melenic stools which have continued daily 1-2 times a day with her last yesterday morning, on Plavix  with her last dose yesterday 05/14/2024, at time of last EGD no Cameron erosions, but a 5 cm hiatal hernia, previously iron deficient documented back in May, unable to take iron supplements per family due to constipation, last colonoscopy 5 to 6 years ago per Dr. Ira notes (cannot see procedure), hgb 11.9 on 04/05/24-->8.5-->7.4-->7.9-->7.8 ; consider  most likely upper GI bleed from GERD/gastritis, duodenitis, Cameron's erosions, angioectasias in the upper GI tract or less likely distal small bowel or right colon 2.  GERD with history of Schatzki's ring:Last dilated in June via EGD, maintained on Pantoprazole  40mg  every day, see above, does continue to have symptoms 3.  CAD on Plavix : Last dose 05/14/2024 AM 4.  Rheumatoid arthritis: Follows with [facility] rheumatology for PMR on Orencia since February 5.  History of breast cancer: Status post bilateral mastectomy and adjuvant radiation, completed 5 years of antiestrogen therapy in 2019 6.  B12 deficiency: On monthly  injections 7.  Chronic constipation: Typically takes states in a mixture of stool softeners or to have more normal normal bowel movements 8.  Iron deficiency anemia: Since at least May of this year, being followed by PCP but able to tolerate oral iron; consider relation to current bleed    Plan: 1.  Discussed Feraheme infusion with the hospitalist team but they told me they are unable to order inpatient and this needs to be done outpatient. 2.  EGD and enteroscopy scheduled this afternoon with Dr. Suzann for further evaluation of upper GI bleed after 72 hours of Plavix  washout. 3.  Continue Pantoprazole  40 mg IV twice daily 4.  Continue to hold Plavix  5.  Again discussed with the patient that she will not have dilation today, will need to be seen back in our outpatient setting when she is more stable for her dysphagia.  Thank you for your kind consultation, please await further recommendations after time of procedure today.    LOS: 1 day   [name]  05/17/2024, 9:43 AM

## 2024-05-17 NOTE — Anesthesia Preprocedure Evaluation (Addendum)
 Anesthesia Evaluation  Patient identified by MRN, date of birth, ID band Patient awake    Reviewed: Allergy & Precautions, NPO status , Patient's Chart, lab work & pertinent test results, reviewed documented beta blocker date and time   History of Anesthesia Complications (+) PONV and history of anesthetic complications  Airway Mallampati: III  TM Distance: >3 FB   Mouth opening: Limited Mouth Opening  Dental  (+) Teeth Intact, Caps, Dental Advisory Given   Pulmonary sleep apnea and Continuous Positive Airway Pressure Ventilation , pneumonia, resolved, former smoker   Pulmonary exam normal breath sounds clear to auscultation       Cardiovascular hypertension, Pt. on medications + angina  + CAD, + Past MI, + Cardiac Stents and + DOE  Normal cardiovascular exam Rhythm:Regular Rate:Normal  EKG 05/15/14 NSR, Low voltage precordial leads, early transition  Echo 05/20/23 1. Left ventricular ejection fraction, by estimation, is 55 to 60%. The  left ventricle has normal function. The left ventricle has no regional  wall motion abnormalities. Left ventricular diastolic parameters were  normal.   2. Right ventricular systolic function is normal. The right ventricular  size is normal.   3. The mitral valve is normal in structure. Trivial mitral valve  regurgitation. No evidence of mitral stenosis.   4. The aortic valve is normal in structure. Aortic valve regurgitation is  not visualized. No aortic stenosis is present.   5. The inferior vena cava is normal in size with greater than 50%  respiratory variability, suggesting right atrial pressure of 3 mmHg.   Cardiac cath [place], CO 95% OM1   Neuro/Psych  Neuromuscular disease  negative psych ROS   GI/Hepatic Neg liver ROS, hiatal hernia,GERD  Medicated,,GI Bleed   Endo/Other  Hx/o Breast Ca  Hyperlipidemia  Renal/GU Renal InsufficiencyRenal disease  negative genitourinary    Musculoskeletal  (+) Arthritis , Rheumatoid disorders,  PMR   Abdominal   Peds  Hematology  (+) Blood dyscrasia, anemia Plavix  therapy- last dose 2 days ago   Anesthesia Other Findings   Reproductive/Obstetrics                              Anesthesia Physical Anesthesia Plan  ASA: 3  Anesthesia Plan: MAC   Post-op Pain Management: Minimal or no pain anticipated   Induction: Intravenous  PONV Risk Score and Plan: 3 and Treatment may vary due to age or medical condition and Propofol  infusion  Airway Management Planned: Natural Airway and Nasal Cannula  Additional Equipment: None  Intra-op Plan:   Post-operative Plan:   Informed Consent: I have reviewed the patients History and Physical, chart, labs and discussed the procedure including the risks, benefits and alternatives for the proposed anesthesia with the patient or authorized representative who has indicated his/her understanding and acceptance.     Dental advisory given  Plan Discussed with: CRNA and Anesthesiologist  Anesthesia Plan Comments:          Anesthesia Quick Evaluation

## 2024-05-17 NOTE — Plan of Care (Signed)

## 2024-05-17 NOTE — Transfer of Care (Addendum)
 Immediate Anesthesia Transfer of Care Note  Patient: Tracey [name]  Procedure(s) Performed: EGD (ESOPHAGOGASTRODUODENOSCOPY)  Patient Location: Endoscopy Unit  Anesthesia Type:MAC  Level of Consciousness: drowsy and patient cooperative  Airway & Oxygen  Therapy: Patient Spontanous Breathing  Post-op Assessment: Report given to RN and Post -op Vital signs reviewed and stable  Post vital signs: Reviewed and stable  Last Vitals:  Vitals Value Taken Time  BP 120/35 05/17/24 13:16  Temp    Pulse 57 05/17/24 13:17  Resp 16 05/17/24 13:17  SpO2 97 % 05/17/24 13:17  Vitals shown include unfiled device data.  Last Pain:  Vitals:   05/17/24 1316  TempSrc:   PainSc: Asleep      Patients Stated Pain Goal: 2 (05/17/24 0844)  Complications: No notable events documented.

## 2024-05-17 NOTE — Progress Notes (Signed)
   05/17/24 1431  TOC Brief Assessment  Insurance and Status Reviewed  Patient has primary care physician Yes  Home environment has been reviewed Resides in single family home with spouse  Prior level of function: Independent with ADLs  Prior/Current Home Services Current home services (HHPT per patient, cannot remember agency)  Social Drivers of Health Review SDOH reviewed no interventions necessary  Readmission risk has been reviewed Yes  Transition of care needs no transition of care needs at this time

## 2024-05-17 NOTE — Progress Notes (Signed)
 PROGRESS NOTE    Tracey [name]  FMW:[id number] DOB: Jul 28, 1946 DOA: 05/15/2024 PCP: Larnell Hamilton, MD   Brief Narrative: Tracey [name] is a 78 y.o. female with a history of CAD, polymyalgia rheumatica, breast cancer, GERD, Schatzki's ring s/p dilation.  Patient presented secondary to chest discomfort and black stools consistent with melena and GI bleed. Chest pain resolved with a large bowel movement. GI consulted and performed an upper GI endoscopy, revealing a non-bleeding ulcer as the likely culprit for bleed.   Assessment and Plan:  Melena Black stools with FOBT positive test. Gastroenterology consulted with plan for upper endoscopy. Complicated by Plavix  use. Plavix  held. Upper GI endoscopy performed on 10/1 revealing her known Schatzki's ring, which was not dilated. Endoscopy was also significant for a tortuous esophagus, Cameron's erosion, erythematous mucosa in the prepyloric region of the stomach and a non-bleeding gastric ulcer with clean base; clips placed and Purastat applied. Gastric ulcer thought to be the likely source of the patient's bleeding. -GI recommendations: restart Plavix , trend hemoglobin, regular diet  Acute on chronic blood loss anemia Iron deficiency anemia Patient is on iron supplementation as an outpatient. Last iron panel from one year prior. Baseline hemoglobin of 9-10.  Hemoglobin down to a low of 7.4 and has remained stable. Hemoglobin up to 8.2 this morning. -CBC in AM  Polymyalgia rheumatica -Continue hydroxychloroquine  and prednisone   Gout Continue allopurinol   CAD Patient is on Crestor  and Plavix  as an outpatient. Plavix  held on admission. -Continue Crestor   Chest pain Appears to have been GI related. Resolved after a large bowel movement.    DVT prophylaxis: SCDs Code Status:   Code Status: Full Code Family Communication: None at bedside Disposition Plan: Discharge home likely in 24 hours pending stable hemoglobin and GI  recommendations   Consultants:  Gastroenterology  Procedures:  Upper GI endoscopy  Antimicrobials: None    Subjective: Bowel movement this morning without evidence of hematochezia or melena.  Objective: BP (!) 152/49   Pulse (!) 56   Temp 97.6 F (36.4 C) (Temporal)   Resp 12   Ht 5' 2 (1.575 m)   Wt 65.9 kg   LMP 08/17/1977 (Approximate)   SpO2 100%   BMI 26.57 kg/m   Examination:  General exam: Appears calm and comfortable Respiratory system: Clear to auscultation. Respiratory effort normal. Cardiovascular system: S1 & S2 heard, RRR. Gastrointestinal system: Abdomen is nondistended, soft and nontender. Normal bowel sounds heard. Central nervous system: Alert and oriented. No focal neurological deficits. Psychiatry: Judgement and insight appear normal. Mood & affect appropriate.    Data Reviewed: I have personally reviewed following labs and imaging studies  CBC Lab Results  Component Value Date   WBC 5.4 05/16/2024   RBC 2.79 (L) 05/16/2024   HGB 8.2 (L) 05/17/2024   HCT 27.1 (L) 05/17/2024   MCV 90.4 05/16/2024   MCH 28.2 05/16/2024   PLT 213 05/16/2024   MCHC 31.2 05/16/2024   RDW 15.7 (H) 05/16/2024   LYMPHSABS 1.6 05/19/2023   MONOABS 0.8 05/19/2023   EOSABS 0.2 05/19/2023   BASOSABS 0.0 05/19/2023     Last metabolic panel Lab Results  Component Value Date   NA 144 05/16/2024   K 4.0 05/16/2024   CL 110 05/16/2024   CO2 22 05/16/2024   BUN 17 05/16/2024   CREATININE 1.12 (H) 05/16/2024   GLUCOSE 89 05/16/2024   GFRNONAA 50 (L) 05/16/2024   GFRAA 45 (L) 03/28/2020   CALCIUM  9.1 05/16/2024   PROT  5.7 (L) 05/15/2024   ALBUMIN 3.8 05/15/2024   LABGLOB 2.3 01/20/2018   AGRATIO 1.9 01/20/2018   BILITOT 0.2 05/15/2024   ALKPHOS 55 05/15/2024   AST 28 05/15/2024   ALT 17 05/15/2024   ANIONGAP 12 05/16/2024    GFR: Estimated Creatinine Clearance: 36.3 mL/min (A) (by C-G formula based on SCr of 1.12 mg/dL (H)).  No results found for  this or any previous visit (from the past 240 hours).    Radiology Studies: No results found.     LOS: 1 day    Elgin Lam, MD [facility] 05/17/2024, 12:46 PM   If 7PM-7AM, please contact night-coverage [email or web address]

## 2024-05-17 NOTE — Telephone Encounter (Signed)
 Auth Submission: NO AUTH NEEDED Site of care: Site of care: [facility] Payer: medicare a/b & supp Medication & CPT/J Code(s) submitted: Feraheme (ferumoxytol) [id number] Diagnosis Code:  Route of submission (phone, fax, portal):  Phone # Fax # Auth type: Buy/Bill PB Units/visits requested: 2 doses Reference number:  Approval from: 06/16/24 to 08/16/24

## 2024-05-18 ENCOUNTER — Telehealth (HOSPITAL_COMMUNITY): Payer: Self-pay | Admitting: Family Medicine

## 2024-05-18 ENCOUNTER — Encounter (HOSPITAL_COMMUNITY): Payer: Self-pay | Admitting: Family Medicine

## 2024-05-18 ENCOUNTER — Encounter (HOSPITAL_COMMUNITY): Payer: Self-pay | Admitting: Pediatrics

## 2024-05-18 ENCOUNTER — Telehealth (HOSPITAL_COMMUNITY): Payer: Self-pay

## 2024-05-18 DIAGNOSIS — D509 Iron deficiency anemia, unspecified: Secondary | ICD-10-CM | POA: Insufficient documentation

## 2024-05-18 DIAGNOSIS — K259 Gastric ulcer, unspecified as acute or chronic, without hemorrhage or perforation: Secondary | ICD-10-CM | POA: Diagnosis not present

## 2024-05-18 DIAGNOSIS — K922 Gastrointestinal hemorrhage, unspecified: Secondary | ICD-10-CM | POA: Diagnosis not present

## 2024-05-18 DIAGNOSIS — K921 Melena: Secondary | ICD-10-CM | POA: Diagnosis not present

## 2024-05-18 DIAGNOSIS — R1310 Dysphagia, unspecified: Secondary | ICD-10-CM | POA: Diagnosis not present

## 2024-05-18 DIAGNOSIS — D62 Acute posthemorrhagic anemia: Secondary | ICD-10-CM | POA: Diagnosis not present

## 2024-05-18 DIAGNOSIS — K254 Chronic or unspecified gastric ulcer with hemorrhage: Secondary | ICD-10-CM | POA: Diagnosis not present

## 2024-05-18 LAB — BASIC METABOLIC PANEL WITH GFR
Anion gap: 10 (ref 5–15)
Anion gap: 13 (ref 5–15)
BUN: 14 mg/dL (ref 8–23)
BUN: 12 mg/dL (ref 8–23)
CO2: 23 mmol/L (ref 22–32)
CO2: 19 mmol/L — ABNORMAL LOW (ref 22–32)
Calcium: 8.4 mg/dL — ABNORMAL LOW (ref 8.9–10.3)
Calcium: 8.5 mg/dL — ABNORMAL LOW (ref 8.9–10.3)
Chloride: 111 mmol/L (ref 98–111)
Chloride: 108 mmol/L (ref 98–111)
Creatinine, Ser: 1.33 mg/dL — ABNORMAL HIGH (ref 0.44–1.00)
Creatinine, Ser: 1.21 mg/dL — ABNORMAL HIGH (ref 0.44–1.00)
GFR, Estimated: 40 mL/min — ABNORMAL LOW (ref >60)
GFR, Estimated: 45 mL/min — ABNORMAL LOW (ref >60)
Glucose, Bld: 97 mg/dL (ref 70–99)
Glucose, Bld: 137 mg/dL — ABNORMAL HIGH (ref 70–99)
Potassium: 3.8 mmol/L (ref 3.5–5.1)
Potassium: 3.5 mmol/L (ref 3.5–5.1)
Sodium: 143 mmol/L (ref 135–145)
Sodium: 141 mmol/L (ref 135–145)

## 2024-05-18 LAB — CBC
HCT: 24.3 % — ABNORMAL LOW (ref 36.0–46.0)
Hemoglobin: 7.3 g/dL — ABNORMAL LOW (ref 12.0–15.0)
MCH: 27.3 pg (ref 26.0–34.0)
MCHC: 30 g/dL (ref 30.0–36.0)
MCV: 91 fL (ref 80.0–100.0)
Platelets: 207 K/uL (ref 150–400)
RBC: 2.67 MIL/uL — ABNORMAL LOW (ref 3.87–5.11)
RDW: 15.3 % (ref 11.5–15.5)
WBC: 5 K/uL (ref 4.0–10.5)
nRBC: 0 % (ref 0.0–0.2)

## 2024-05-18 LAB — HEMOGLOBIN: Hemoglobin: 8.3 g/dL — ABNORMAL LOW (ref 12.0–15.0)

## 2024-05-18 MED ORDER — PANTOPRAZOLE SODIUM 40 MG PO TBEC: 40.0000 mg | DELAYED_RELEASE_TABLET | Freq: Two times a day (BID) | ORAL | 3 refills | Status: AC

## 2024-05-18 NOTE — Discharge Summary (Signed)
 Physician Discharge Summary   Patient: Tracey [name] MRN: [id number] DOB: 05/03/1946  Admit date:     05/15/2024  Discharge date: 05/18/24  Discharge Physician: Sabas GORMAN Brod   PCP: Larnell Hamilton, MD   Recommendations at discharge:      Discharge Diagnoses: Principal Problem:   Acute gastrointestinal bleeding Active Problems:   Melena   Dysphagia   Upper GI bleed   ABLA (acute blood loss anemia)   Gastric ulcer  Resolved Problems:   * No resolved hospital problems. *  Hospital Course: Tracey [name] is a 78 y.o. female with a history of CAD, polymyalgia rheumatica, breast cancer, GERD, Schatzki's ring s/p dilation.  Patient presented secondary to chest discomfort and black stools consistent with melena and GI bleed. Chest pain resolved with a large bowel movement. GI consulted and performed an upper GI endoscopy, revealing a non-bleeding ulcer as the likely culprit for bleed.  Assessment and Plan:  Melena Black stools with FOBT positive test. Gastroenterology consulted Complicated by Plavix  use. Plavix  held. Upper GI endoscopy performed on 10/1 revealing her known Schatzki's ring, which was not dilated. Endoscopy was also significant for a tortuous esophagus, Cameron's erosion, erythematous mucosa in the prepyloric region of the stomach and a non-bleeding gastric ulcer with clean base; clips placed and Purastat applied. Gastric ulcer thought to be the likely source of the patient's bleeding. -GI recommendations: restart Plavix , - Continue Protonix  40 mg p.o. twice daily -Follow-up gastroenterology as outpatient -Likely need repeat EGD in 8 to 12 weeks   Acute on chronic blood loss anemia Iron deficiency anemia Patient is on iron supplementation as an outpatient. Last iron panel from one year prior. Baseline hemoglobin of 9-10.  Hemoglobin down to a low of 7.4 and has remained stable. Hemoglobin up to 8.3 this morning.  Polymyalgia rheumatica -Continue hydroxychloroquine   and prednisone    Gout Continue allopurinol    CAD Patient is on Crestor  and Plavix  as an outpatient. Plavix  held on admission. -Continue Crestor  -Plavix  restarted as per GI recommendation   Chest pain Appears to have been GI related. Resolved after a large bowel movement.           Consultants: Gastroenterology Procedures performed: EGD Disposition: Home Diet recommendation:  Discharge Diet Orders (From admission, onward)     Start     Ordered   05/18/24 0000  Diet - low sodium heart healthy        05/18/24 1254           Regular diet DISCHARGE MEDICATION: Allergies as of 05/18/2024       Reactions   Iodine  Hives, Swelling   Other Reaction(s): Unknown   Pseudoeph-hydrocodone -gg Other (See Comments)   hallucinations   Benadryl  [diphenhydramine ] Other (See Comments)   hallucinations   Ivp Dye [iodinated Contrast Media] Hives   Methocarbamol  Other (See Comments)   NO MUSCLE RELAXER'S  Hallucinations   Oxycodone  Other (See Comments)   hallucinations   Shellfish-derived Products    Other Reaction(s): Unknown   Sulfa Antibiotics Swelling   Tape Other (See Comments)    tears skin ; ok to use paper tape         Medication List     TAKE these medications    allopurinol  100 MG tablet Commonly known as: ZYLOPRIM  TAKE 1 TABLET BY MOUTH ONCE DAILY   Breztri  Aerosphere 160-9-4.8 MCG/ACT Aero inhaler Generic drug: budesonide -glycopyrrolate -formoterol  Inhale 2 puffs into the lungs in the morning and at bedtime. What changed:  when to take this  reasons to take this   clopidogrel  75 MG tablet Commonly known as: PLAVIX  TAKE 1 TABLET(75 MG) BY MOUTH DAILY   cyanocobalamin  1000 MCG/ML injection Commonly known as: VITAMIN B12 Inject 1,000 mcg into the muscle every 30 (thirty) days.   estradiol 0.1 MG/GM vaginal cream Commonly known as: ESTRACE Place 1 Applicatorful vaginally 2 (two) times a week.   fluticasone  50 MCG/ACT nasal spray Commonly known as:  FLONASE  Place 2 sprays into both nostrils daily.   folic acid  1 MG tablet Commonly known as: FOLVITE  Take 1 mg by mouth daily.   furosemide  40 MG tablet Commonly known as: LASIX  Take 40 mg by mouth daily as needed for fluid.   Hydroxychloroquine  Sulfate 300 MG Tabs Take 300 mg by mouth daily.   ketoconazole  2 % shampoo Commonly known as: NIZORAL  Apply 1 Application topically as needed.   lidocaine  5 % Commonly known as: Lidoderm  Place 1 patch onto the skin daily. Remove & Discard patch within 12 hours or as directed by MD What changed:  when to take this reasons to take this   loratadine  10 MG tablet Commonly known as: CLARITIN  Take 10 mg by mouth daily.   metoprolol  succinate 25 MG 24 hr tablet Commonly known as: TOPROL -XL TAKE 1 TABLET(25 MG) BY MOUTH DAILY   Multi-Vitamin tablet Take 1 tablet by mouth daily.   Myrbetriq 50 MG Tb24 tablet Generic drug: mirabegron ER Take 1 tablet by mouth daily.   nitroGLYCERIN  0.4 MG SL tablet Commonly known as: NITROSTAT  Place 1 tablet (0.4 mg total) under the tongue every 5 (five) minutes as needed for chest pain.   Orencia ClickJect 125 MG/ML Soaj Generic drug: Abatacept Inject 125 mg into the skin once a week.   pantoprazole  40 MG tablet Commonly known as: PROTONIX  Take 1 tablet (40 mg total) by mouth 2 (two) times daily.   polyethylene glycol 17 g packet Commonly known as: MIRALAX  / GLYCOLAX  Take 17 g by mouth daily as needed for mild constipation.   predniSONE  1 MG tablet Commonly known as: DELTASONE  Take 2 mg by mouth daily with breakfast.   rosuvastatin  20 MG tablet Commonly known as: CRESTOR  Take 1 tablet (20 mg total) by mouth daily.   sertraline  100 MG tablet Commonly known as: ZOLOFT  Take 0.5 tablets (50 mg total) by mouth daily.   Systane Complete 0.6 % Soln Generic drug: Propylene Glycol Place 2 drops into both eyes 2 (two) times daily.   testosterone  cypionate 200 MG/ML injection Commonly  known as: DEPOTESTOSTERONE CYPIONATE Inject 200 mg into the muscle every 30 (thirty) days.   traMADol  50 MG tablet Commonly known as: ULTRAM  Take 50-100 mg by mouth 2 (two) times daily as needed.   Vitamin D3 50 MCG (2000 UT) Tabs Take 2,000 Units by mouth daily.        Follow-up Information     Larnell Hamilton, MD Follow up in 1 week(s).   Specialty: Internal Medicine Why: Check hemoglobin in 1 week at PCP office Contact information: [address] [place] [address] [phone]         Suzann Inocente HERO, MD. Schedule an appointment as soon as possible for a visit.   Specialty: Gastroenterology Contact information: [address] [place] [place] [address] [phone]                Discharge Exam: Fredricka Weights   05/16/24 1210 05/17/24 1153  Weight: 65.9 kg 65.9 kg   General-appears in no acute distress Heart-S1-S2,  regular, no murmur auscultated Lungs-clear to auscultation bilaterally, no wheezing or crackles auscultated Abdomen-soft, nontender, no organomegaly Extremities-no edema in the lower extremities Neuro-alert, oriented x3, no focal deficit noted  Condition at discharge: good  The results of significant diagnostics from this hospitalization (including imaging, microbiology, ancillary and laboratory) are listed below for reference.   Imaging Studies: DG Chest 2 View Result Date: 05/15/2024 CLINICAL DATA:  Chest pain. EXAM: CHEST - 2 VIEW COMPARISON:  12/25/2023 FINDINGS: The heart size and mediastinal contours are within normal limits. Both lungs are clear. Thoracic spine degenerative changes and right shoulder prosthesis again noted. IMPRESSION: No active cardiopulmonary disease. Electronically Signed   By: Norleen DELENA Kil M.D.   On: 05/15/2024 11:32    Microbiology: Results for orders placed or performed during the hospital encounter of 05/16/23  Resp panel by RT-PCR (RSV, Flu A&B, Covid) Anterior Nasal Swab     Status: None    Collection Time: 05/16/23  5:58 PM   Specimen: Anterior Nasal Swab  Result Value Ref Range Status   SARS Coronavirus 2 by RT PCR NEGATIVE NEGATIVE Final    Comment: (NOTE) SARS-CoV-2 target nucleic acids are NOT DETECTED.  The SARS-CoV-2 RNA is generally detectable in upper respiratory specimens during the acute phase of infection. The lowest concentration of SARS-CoV-2 viral copies this assay can detect is 138 copies/mL. A negative result does not preclude SARS-Cov-2 infection and should not be used as the sole basis for treatment or other patient management decisions. A negative result may occur with  improper specimen collection/handling, submission of specimen other than nasopharyngeal swab, presence of viral mutation(s) within the areas targeted by this assay, and inadequate number of viral copies(<138 copies/mL). A negative result must be combined with clinical observations, patient history, and epidemiological information. The expected result is Negative.  Fact Sheet for Patients:  BloggerCourse.com  Fact Sheet for Healthcare Providers:  SeriousBroker.it  This test is no t yet approved or cleared by the [place]  FDA and  has been authorized for detection and/or diagnosis of SARS-CoV-2 by FDA under an Emergency Use Authorization (EUA). This EUA will remain  in effect (meaning this test can be used) for the duration of the COVID-19 declaration under Section 564(b)(1) of the Act, 21 U.S.C.section 360bbb-3(b)(1), unless the authorization is terminated  or revoked sooner.       Influenza A by PCR NEGATIVE NEGATIVE Final   Influenza B by PCR NEGATIVE NEGATIVE Final    Comment: (NOTE) The Xpert Xpress SARS-CoV-2/FLU/RSV plus assay is intended as an aid in the diagnosis of influenza from Nasopharyngeal swab specimens and should not be used as a sole basis for treatment. Nasal washings and aspirates are unacceptable for  Xpert Xpress SARS-CoV-2/FLU/RSV testing.  Fact Sheet for Patients: BloggerCourse.com  Fact Sheet for Healthcare Providers: SeriousBroker.it  This test is not yet approved or cleared by the [place]  FDA and has been authorized for detection and/or diagnosis of SARS-CoV-2 by FDA under an Emergency Use Authorization (EUA). This EUA will remain in effect (meaning this test can be used) for the duration of the COVID-19 declaration under Section 564(b)(1) of the Act, 21 U.S.C. section 360bbb-3(b)(1), unless the authorization is terminated or revoked.     Resp Syncytial Virus by PCR NEGATIVE NEGATIVE Final    Comment: (NOTE) Fact Sheet for Patients: BloggerCourse.com  Fact Sheet for Healthcare Providers: SeriousBroker.it  This test is not yet approved or cleared by the [place]  FDA and has been authorized for detection and/or diagnosis of  SARS-CoV-2 by FDA under an Emergency Use Authorization (EUA). This EUA will remain in effect (meaning this test can be used) for the duration of the COVID-19 declaration under Section 564(b)(1) of the Act, 21 U.S.C. section 360bbb-3(b)(1), unless the authorization is terminated or revoked.  Performed at [facility], [address] [place], [place] [address]   Culture, blood (routine x 2)     Status: None   Collection Time: 05/16/23  8:17 PM   Specimen: BLOOD  Result Value Ref Range Status   Specimen Description   Final    BLOOD LEFT ANTECUBITAL Performed at [facility], [address], [place], [place] [address]    Special Requests   Final    BOTTLES DRAWN AEROBIC AND ANAEROBIC Blood Culture adequate volume Performed at [facility], [address] [place], [place] [address]    Culture   Final    NO GROWTH 5 DAYS Performed at [facility], [address],  [place], [place] [address]    Report Status 05/22/2023 FINAL  Final  Culture, blood (routine x 2)     Status: None   Collection Time: 05/16/23 10:20 PM   Specimen: BLOOD  Result Value Ref Range Status   Specimen Description   Final    BLOOD BLOOD LEFT ARM UPPER Performed at [facility], [address] [place], [place] [address]    Special Requests   Final    BOTTLES DRAWN AEROBIC AND ANAEROBIC Blood Culture adequate volume Performed at [facility], [address] [place], [place] [address]    Culture   Final    NO GROWTH 5 DAYS Performed at [facility], [address] [place], [place] [address]    Report Status 05/22/2023 FINAL  Final    Labs: CBC: Recent Labs  Lab 05/15/24 1102 05/15/24 1434 05/16/24 0433 05/16/24 1735 05/17/24 1101 05/17/24 1707 05/18/24 0347 05/18/24 1008  WBC 6.2  --  5.4  --   --   --  5.0  --   HGB 8.5*   < > 7.9* 7.8* 8.2* 8.2* 7.3* 8.3*  HCT 27.3*   < > 25.3* 26.0* 27.1* 26.6* 24.3*  --   MCV 90.7  --  90.4  --   --   --  91.0  --   PLT 214  --  213  --   --   --  207  --    < > = values in this interval not displayed.   Basic Metabolic Panel: Recent Labs  Lab 05/15/24 1102 05/16/24 0433 05/18/24 0343 05/18/24 1008  NA 142 144 143 141  K 3.9 4.0 3.8 3.5  CL 107 110 111 108  CO2 22 22 23  19*  GLUCOSE 90 89 97 137*  BUN 22 17 14 12   CREATININE 1.19* 1.12* 1.33* 1.21*  CALCIUM  9.5 9.1 8.4* 8.5*   Liver Function Tests: Recent Labs  Lab 05/15/24 1243  AST 28  ALT 17  ALKPHOS 55  BILITOT 0.2  PROT 5.7*  ALBUMIN 3.8   CBG: No results for input(s): GLUCAP in the last 168 hours.  Discharge time spent: greater than 30 minutes.  Signed: Sabas GORMAN Brod, MD [facility] 05/18/2024

## 2024-05-18 NOTE — Telephone Encounter (Signed)
 Auth Submission: NO AUTH NEEDED Site of care: Site of care: [facility] Payer: Medicare A/B, [employer] Supplement Medication & CPT/J Code(s) submitted: Feraheme (ferumoxytol) [id number] Diagnosis Code: D64.9, D50.9 Route of submission (phone, fax, portal):  Phone # Fax # Auth type: Buy/Bill HB Units/visits requested: 510mg  x 2 doses Reference number:  Approval from: 05/18/24 to 09/16/24

## 2024-05-18 NOTE — Progress Notes (Signed)
 Progress Note   Subjective  Hospital day #3 Chief Complaint: GI bleed, anemia  Today, patient describes that she is doing well.  She is eating a regular breakfast.  She had a brown bowel movement this morning.  She is worried because they did not give her her dose of morning Plavix  given that her hemoglobin dropped overnight.  She is very worried that she will have to stay in the hospital again.  Apparently having anxiety and tearful breakdowns in the evening given all the noises that wake her up from other patients and just thinking about her situation and not being home with her dogs and in her own space.  Daughter is by her bedside.  We had a long discussion about what is currently happening.  Certainly do not want to send her home if we suspect she is still bleeding.   Objective   Vital signs in last 24 hours: Temp:  [97.6 F (36.4 C)-98.5 F (36.9 C)] 98 F (36.7 C) (10/02 0423) Pulse Rate:  [56-72] 68 (10/02 0423) Resp:  [10-32] 16 (10/02 0423) BP: (113-152)/(35-57) 122/50 (10/02 0423) SpO2:  [94 %-100 %] 100 % (10/02 0423) Weight:  [65.9 kg] 65.9 kg (10/01 1153) Last BM Date : 05/16/24 General:    Elderly white female in NAD Heart:  Regular rate and rhythm; no murmurs Lungs: Respirations even and unlabored, lungs CTA bilaterally Abdomen:  Soft, nontender and nondistended. Normal bowel sounds. Psych:  Cooperative. Normal mood and affect.  Intake/Output from previous day: 10/01 0701 - 10/02 0700 In: 665 [P.O.:240; I.V.:425] Out: -    Lab Results: Recent Labs    05/15/24 1102 05/15/24 1434 05/16/24 0433 05/16/24 1735 05/17/24 1101 05/17/24 1707 05/18/24 0347  WBC 6.2  --  5.4  --   --   --  5.0  HGB 8.5*   < > 7.9*   < > 8.2* 8.2* 7.3*  HCT 27.3*   < > 25.3*   < > 27.1* 26.6* 24.3*  PLT 214  --  213  --   --   --  207   < > = values in this interval not displayed.   BMET Recent Labs    05/15/24 1102 05/16/24 0433  NA 142 144  K 3.9 4.0  CL 107 110   CO2 22 22  GLUCOSE 90 89  BUN 22 17  CREATININE 1.19* 1.12*  CALCIUM  9.5 9.1   LFT Recent Labs    05/15/24 1243  PROT 5.7*  ALBUMIN 3.8  AST 28  ALT 17  ALKPHOS 55  BILITOT 0.2  BILIDIR 0.1  IBILI 0.1*    Assessment / Plan:   Tracey [name] is a 78 year old female with a past medical history of CAD on Plavix  (last dose 05/14/2024 AM), PMR, breast cancer, OSA who was initially admitted with chest pain, weakness, shortness of breath, dysphagia and melena. Symptoms started on Wednesday evening, 05/10/2024 abruptly and has been having melena since.Hemoglobin stable overnight.   Assessment: 1.  Dysphagia/atypical chest pain/melena: Dysphagia initially resolved after EGD with dilation of Schatzki's ring in June, better for a month or 2, then symptoms came back towards liquids and solids describing a spasm as well, 9/24 had increased chest discomfort and weakness with an episode of vomiting and proceeded to have melenic stools which continued daily 1-2 times a day with her last the morning of 9/30, initially Plavix  held, restarted 05/17/2024, has received 1 dose with a drop in hemoglobin overnight from 8.2-7.3, unable to  take iron supplements per family due to constipation, last colonoscopy 5 to 6 years ago per Dr. Ira notes (cannot see procedure), EGD 05/17/2024 with a nonbleeding gastric ulcer with a clean place for which clips were placed, nonobstructing moderate Schatzki's ring, medium sized hiatal hernia single Cameron's erosion; ulcer thought to be the most likely source of bleeding 2.  GERD with history of Schatzki's ring:Last dilated in June via EGD, maintained on Pantoprazole  40mg  every day, see above, does continue to have symptoms, moved to be dilated on 10/1 via EGD given that it only been 72 hours that Plavix  had been held 3.  CAD on Plavix : Initially held, restarted 05/17/2024, patient has been given 1 dose and then her dose this morning was held again given a drop in hemoglobin 4.   Rheumatoid arthritis: Follows with [facility] rheumatology for PMR on Orencia since February 5.  History of breast cancer: Status post bilateral mastectomy and adjuvant radiation, completed 5 years of antiestrogen therapy in 2019 6.  B12 deficiency: On monthly injections 7.  Chronic constipation: Typically takes states in a mixture of stool softeners or to have more normal normal bowel movements 8.  Iron deficiency anemia: Since at least May of this year, being followed by PCP but able to tolerate oral iron; consider relation to current bleed    Plan: 1.  Continue Pantoprazole  40 mg IV twice daily, at discharge convert to Protonix  40 mg p.o. twice daily 2.  Avoid NSAIDs 3.  Patient will need outpatient EGD in 8 weeks with Dr. Charlanne for confirmation of ulcer healing and to reassess esophageal dilation, will need office visit before hand to hold Plavix  4.  Patient would benefit from iron infusions when discharged, hospitalist team has informed me we cannot order these inpatient 5.  Okay with holding Plavix  dose this morning, ordered a stat recheck of hemoglobin and BMP to give us  a better idea of where were at, she has been having brown stools thankfully 6.  Patient would really like to go home.  Will wait until labs result to make a better decision in regards to this.  Thank you for your kind consultation, we will continue to follow.   LOS: 1 day   Tracey [name]  05/18/2024, 9:48 AM

## 2024-05-18 NOTE — Progress Notes (Signed)
   05/17/24 2200  BiPAP/CPAP/SIPAP  Reason BIPAP/CPAP not in use Other(comment) (does not use at home)  BiPAP/CPAP /SiPAP Vitals  Resp (!) 21  MEWS Score/Color  MEWS Score 1  MEWS Score Color Landy

## 2024-05-18 NOTE — Telephone Encounter (Signed)
 Patient referred to infusion pharmacy team for ambulatory infusion of IV iron.  Insurance - Medicare A/B Site of care - Site of care: [facility] Dx code - D64.9/D50.9  IV Iron Therapy - Feraheme 510 mg Iv x 2  Infusion appointments - Scheduling team will schedule patient as soon as possible.    Tracey [name], PharmD

## 2024-05-18 NOTE — Progress Notes (Incomplete)
 [facility]  PROGRESS NOTE  Tracey [name] FMW:[id number] DOB: [birth year] DOA: 05/15/2024 PCP: Larnell Hamilton, MD   Brief HPI:   78 y.o. female with a history of CAD, polymyalgia rheumatica, breast cancer, GERD, Schatzki's ring s/p dilation.  Patient presented secondary to chest discomfort and black stools consistent with melena and GI bleed. Chest pain resolved with a large bowel movement. GI consulted and performed an upper GI endoscopy, revealing a non-bleeding ulcer as the likely culprit for bleed.      Assessment/Plan:   Melena Black stools with FOBT positive test. Gastroenterology consulted with plan for upper endoscopy. Complicated by Plavix  use. Plavix  held. Upper GI endoscopy performed on 10/1 revealing her known Schatzki's ring, which was not dilated. Endoscopy was also significant for a tortuous esophagus, Cameron's erosion, erythematous mucosa in the prepyloric region of the stomach and a non-bleeding gastric ulcer with clean base; clips placed and Purastat applied. Gastric ulcer thought to be the likely source of the patient's bleeding. -GI recommendations: restart Plavix , trend hemoglobin, regular diet   Acute on chronic blood loss anemia Iron deficiency anemia Patient is on iron supplementation as an outpatient. Last iron panel from one year prior. Baseline hemoglobin of 9-10.  Hemoglobin down to a low of 7.4 and has remained stable. Hemoglobin up to 8.2 this morning. -CBC in AM   Polymyalgia rheumatica -Continue hydroxychloroquine  and prednisone    Gout Continue allopurinol    CAD Patient is on Crestor  and Plavix  as an outpatient. Plavix  held on admission. -Continue Crestor    Chest pain Appears to have been GI related. Resolved after a large bowel movement.      Medications     allopurinol   100 mg Oral Daily   cholecalciferol   2,000 Units Oral Daily   clopidogrel   75 mg Oral Daily   fluticasone   2 spray Each Nare Daily   folic acid   1 mg Oral Daily    hydroxychloroquine   300 mg Oral Daily   metoprolol  succinate  25 mg Oral Daily   mirabegron ER  50 mg Oral Daily   pantoprazole  (PROTONIX ) IV  40 mg Intravenous BID   predniSONE   2 mg Oral Q breakfast   rosuvastatin   20 mg Oral Daily   sertraline   50 mg Oral Daily     Data Reviewed:   CBG:  No results for input(s): GLUCAP in the last 168 hours.  SpO2: 100 %    Vitals:   05/17/24 1443 05/17/24 2009 05/17/24 2200 05/18/24 0423  BP: (!) 134/57 (!) 142/52  (!) 122/50  Pulse: 60 72  68  Resp: 19 18 (!) 21 16  Temp: 98.5 F (36.9 C) 98.2 F (36.8 C)  98 F (36.7 C)  TempSrc: Oral Oral  Oral  SpO2: 94% 100%  100%  Weight:      Height:          Data Reviewed:  Basic Metabolic Panel: Recent Labs  Lab 05/15/24 1102 05/16/24 0433  NA 142 144  K 3.9 4.0  CL 107 110  CO2 22 22  GLUCOSE 90 89  BUN 22 17  CREATININE 1.19* 1.12*  CALCIUM  9.5 9.1    CBC: Recent Labs  Lab 05/15/24 1102 05/15/24 1434 05/16/24 0433 05/16/24 1735 05/17/24 1101 05/17/24 1707 05/18/24 0347  WBC 6.2  --  5.4  --   --   --  5.0  HGB 8.5*   < > 7.9* 7.8* 8.2* 8.2* 7.3*  HCT 27.3*   < > 25.3* 26.0* 27.1* 26.6*  24.3*  MCV 90.7  --  90.4  --   --   --  91.0  PLT 214  --  213  --   --   --  207   < > = values in this interval not displayed.    LFT Recent Labs  Lab 05/15/24 1243  AST 28  ALT 17  ALKPHOS 55  BILITOT 0.2  PROT 5.7*  ALBUMIN 3.8     Antibiotics: Anti-infectives (From admission, onward)    Start     Dose/Rate Route Frequency Ordered Stop   05/16/24 1730  hydroxychloroquine  (PLAQUENIL ) tablet 300 mg        300 mg Oral Daily 05/16/24 1637          DVT prophylaxis: ***  Code Status: ***  Family Communication: ***   CONSULTS ***   Subjective   ***   Objective    Physical Examination:   General:  *** Cardiovascular: *** Respiratory: *** Abdomen: *** Extremities: ***  Neurologic:  ***   Status is: Inpatient:  ***            Tracey [name] S Tracey [name]   [facility] If 7PM-7AM, please contact night-coverage at [email or web address], Office  [phone]   05/18/2024, 8:34 AM  LOS: 1 day

## 2024-05-18 NOTE — Plan of Care (Signed)
  Problem: Clinical Measurements: Goal: Respiratory complications will improve Outcome: Progressing   Problem: Clinical Measurements: Goal: Cardiovascular complication will be avoided Outcome: Progressing   Problem: Activity: Goal: Risk for activity intolerance will decrease Outcome: Progressing   Problem: Nutrition: Goal: Adequate nutrition will be maintained Outcome: Progressing   

## 2024-05-24 DIAGNOSIS — N1832 Chronic kidney disease, stage 3b: Secondary | ICD-10-CM | POA: Diagnosis not present

## 2024-05-24 DIAGNOSIS — Z8719 Personal history of other diseases of the digestive system: Secondary | ICD-10-CM | POA: Diagnosis not present

## 2024-05-24 DIAGNOSIS — K253 Acute gastric ulcer without hemorrhage or perforation: Secondary | ICD-10-CM | POA: Diagnosis not present

## 2024-05-24 DIAGNOSIS — M5440 Lumbago with sciatica, unspecified side: Secondary | ICD-10-CM | POA: Diagnosis not present

## 2024-05-24 DIAGNOSIS — D649 Anemia, unspecified: Secondary | ICD-10-CM | POA: Diagnosis not present

## 2024-05-24 DIAGNOSIS — I130 Hypertensive heart and chronic kidney disease with heart failure and stage 1 through stage 4 chronic kidney disease, or unspecified chronic kidney disease: Secondary | ICD-10-CM | POA: Diagnosis not present

## 2024-05-24 DIAGNOSIS — I129 Hypertensive chronic kidney disease with stage 1 through stage 4 chronic kidney disease, or unspecified chronic kidney disease: Secondary | ICD-10-CM | POA: Diagnosis not present

## 2024-05-24 DIAGNOSIS — M353 Polymyalgia rheumatica: Secondary | ICD-10-CM | POA: Diagnosis not present

## 2024-05-24 DIAGNOSIS — G479 Sleep disorder, unspecified: Secondary | ICD-10-CM | POA: Diagnosis not present

## 2024-05-24 DIAGNOSIS — K222 Esophageal obstruction: Secondary | ICD-10-CM | POA: Diagnosis not present

## 2024-05-24 DIAGNOSIS — R3915 Urgency of urination: Secondary | ICD-10-CM | POA: Diagnosis not present

## 2024-05-24 NOTE — Progress Notes (Signed)
 Patient scheduled for CPAP f/u visit but did not bring power cord to obtain a download. Visit was cancelled and advised to reschedule. She was advised to ensure she brings machine and power cord at the rescheduled visit

## 2024-05-25 ENCOUNTER — Encounter: Payer: Self-pay | Admitting: Rheumatology

## 2024-05-25 ENCOUNTER — Ambulatory Visit (INDEPENDENT_AMBULATORY_CARE_PROVIDER_SITE_OTHER): Payer: Self-pay | Admitting: Adult Health

## 2024-05-25 ENCOUNTER — Encounter (HOSPITAL_COMMUNITY): Payer: Self-pay | Admitting: Family Medicine

## 2024-05-25 ENCOUNTER — Encounter: Payer: Self-pay | Admitting: Adult Health

## 2024-05-25 VITALS — BP 126/53 | HR 72 | Ht 62.0 in | Wt 146.0 lb

## 2024-05-25 DIAGNOSIS — K219 Gastro-esophageal reflux disease without esophagitis: Secondary | ICD-10-CM | POA: Insufficient documentation

## 2024-05-25 DIAGNOSIS — E663 Overweight: Secondary | ICD-10-CM | POA: Diagnosis not present

## 2024-05-25 DIAGNOSIS — Z6826 Body mass index (BMI) 26.0-26.9, adult: Secondary | ICD-10-CM | POA: Diagnosis not present

## 2024-05-25 DIAGNOSIS — Z5321 Procedure and treatment not carried out due to patient leaving prior to being seen by health care provider: Secondary | ICD-10-CM

## 2024-05-25 DIAGNOSIS — I10 Essential (primary) hypertension: Secondary | ICD-10-CM | POA: Insufficient documentation

## 2024-05-25 DIAGNOSIS — M069 Rheumatoid arthritis, unspecified: Secondary | ICD-10-CM | POA: Insufficient documentation

## 2024-05-25 DIAGNOSIS — K222 Esophageal obstruction: Secondary | ICD-10-CM | POA: Insufficient documentation

## 2024-05-25 DIAGNOSIS — I129 Hypertensive chronic kidney disease with stage 1 through stage 4 chronic kidney disease, or unspecified chronic kidney disease: Secondary | ICD-10-CM | POA: Insufficient documentation

## 2024-05-25 DIAGNOSIS — M7918 Myalgia, other site: Secondary | ICD-10-CM | POA: Diagnosis not present

## 2024-05-25 DIAGNOSIS — G4733 Obstructive sleep apnea (adult) (pediatric): Secondary | ICD-10-CM | POA: Insufficient documentation

## 2024-05-25 DIAGNOSIS — M064 Inflammatory polyarthropathy: Secondary | ICD-10-CM | POA: Insufficient documentation

## 2024-05-25 DIAGNOSIS — L930 Discoid lupus erythematosus: Secondary | ICD-10-CM | POA: Insufficient documentation

## 2024-05-25 DIAGNOSIS — M353 Polymyalgia rheumatica: Secondary | ICD-10-CM | POA: Diagnosis not present

## 2024-05-25 DIAGNOSIS — M2560 Stiffness of unspecified joint, not elsewhere classified: Secondary | ICD-10-CM | POA: Diagnosis not present

## 2024-05-25 DIAGNOSIS — M0609 Rheumatoid arthritis without rheumatoid factor, multiple sites: Secondary | ICD-10-CM | POA: Diagnosis not present

## 2024-05-26 ENCOUNTER — Ambulatory Visit (HOSPITAL_COMMUNITY)
Admission: RE | Admit: 2024-05-26 | Discharge: 2024-05-26 | Disposition: A | Discharge status: Home / Self Care | Source: Ambulatory Visit | Attending: Internal Medicine | Admitting: Internal Medicine

## 2024-05-26 VITALS — BP 100/50 | HR 62 | Temp 98.2°F

## 2024-05-26 DIAGNOSIS — D509 Iron deficiency anemia, unspecified: Secondary | ICD-10-CM | POA: Insufficient documentation

## 2024-05-26 DIAGNOSIS — D649 Anemia, unspecified: Secondary | ICD-10-CM | POA: Insufficient documentation

## 2024-05-26 MED ORDER — SODIUM CHLORIDE 0.9 % IV SOLN
510.0000 mg | Freq: Once | INTRAVENOUS | Status: AC
  Administered 2024-05-26: 510 mg via INTRAVENOUS
  Filled 2024-05-26: qty 17

## 2024-05-26 NOTE — Progress Notes (Addendum)
 Diagnosis: iron deficiency anemia    Provider: Elgin Lam    Procedure:Feraheme 510 mg    Note: pt received above IV infusion 1 of 2.  Pt tolerated well, vital signs stable.  Follow up appt made prior to discharge. Patient observed 30 minutes post infusion without incident. AVS provided and discharge instructions given, patient verbalized understanding.  Pt ambulatory upon discharge, all belonging returned to patient and discharged home via POV.

## 2024-05-29 DIAGNOSIS — M5440 Lumbago with sciatica, unspecified side: Secondary | ICD-10-CM | POA: Diagnosis not present

## 2024-05-29 DIAGNOSIS — M5416 Radiculopathy, lumbar region: Secondary | ICD-10-CM | POA: Diagnosis not present

## 2024-06-02 DIAGNOSIS — M5416 Radiculopathy, lumbar region: Secondary | ICD-10-CM | POA: Diagnosis not present

## 2024-06-02 DIAGNOSIS — M5440 Lumbago with sciatica, unspecified side: Secondary | ICD-10-CM | POA: Diagnosis not present

## 2024-06-05 ENCOUNTER — Other Ambulatory Visit: Payer: Self-pay | Admitting: Cardiovascular Disease

## 2024-06-05 DIAGNOSIS — E785 Hyperlipidemia, unspecified: Secondary | ICD-10-CM

## 2024-06-05 DIAGNOSIS — M5440 Lumbago with sciatica, unspecified side: Secondary | ICD-10-CM | POA: Diagnosis not present

## 2024-06-06 ENCOUNTER — Ambulatory Visit: Admitting: Adult Health

## 2024-06-06 ENCOUNTER — Encounter: Payer: Self-pay | Admitting: Neurology

## 2024-06-06 ENCOUNTER — Ambulatory Visit (INDEPENDENT_AMBULATORY_CARE_PROVIDER_SITE_OTHER): Admitting: Neurology

## 2024-06-06 ENCOUNTER — Encounter (HOSPITAL_COMMUNITY): Payer: Self-pay | Admitting: Family Medicine

## 2024-06-06 ENCOUNTER — Encounter: Payer: Self-pay | Admitting: Rheumatology

## 2024-06-06 ENCOUNTER — Ambulatory Visit: Admitting: Neurology

## 2024-06-06 VITALS — BP 124/46 | HR 66 | Ht 62.0 in | Wt 149.0 lb

## 2024-06-06 DIAGNOSIS — R0609 Other forms of dyspnea: Secondary | ICD-10-CM

## 2024-06-06 DIAGNOSIS — M353 Polymyalgia rheumatica: Secondary | ICD-10-CM | POA: Diagnosis not present

## 2024-06-06 DIAGNOSIS — D62 Acute posthemorrhagic anemia: Secondary | ICD-10-CM | POA: Diagnosis not present

## 2024-06-06 DIAGNOSIS — M459 Ankylosing spondylitis of unspecified sites in spine: Secondary | ICD-10-CM | POA: Diagnosis not present

## 2024-06-06 DIAGNOSIS — G4739 Other sleep apnea: Secondary | ICD-10-CM | POA: Diagnosis not present

## 2024-06-06 DIAGNOSIS — Z9981 Dependence on supplemental oxygen: Secondary | ICD-10-CM | POA: Diagnosis not present

## 2024-06-06 DIAGNOSIS — R2681 Unsteadiness on feet: Secondary | ICD-10-CM | POA: Diagnosis not present

## 2024-06-06 MED ORDER — ALPHA-LIPOIC ACID 300 MG PO TABS: 300.0000 mg | ORAL_TABLET | Freq: Two times a day (BID) | ORAL | Status: AC

## 2024-06-06 MED ORDER — ALPRAZOLAM 0.25 MG PO TABS: 0.2500 mg | ORAL_TABLET | Freq: Every evening | ORAL | 0 refills | Status: AC | PRN

## 2024-06-06 NOTE — Progress Notes (Signed)
 Provider:  Dedra Gores, MD  Primary Care Physician:  Larnell Hamilton, MD [address] [place] [address]     Referring Provider: Larnell Hamilton, Md [address] [place],  [place] [address]          Chief Complaint according to patient   Patient presents with:                HISTORY OF PRESENT ILLNESS:  Tracey [name] is a 78 y.o. female patient who is here for revisit 06/06/2024 for  MS and physical decline,  history of complex sleep apnea.  Anxious and tearful today.  She needed oxygen  in addition to PAP when traveling to the [facility].    Was hospitalized with a bleeding ulcer, had lost a lot of blood, hemoglobin went to the 7 range.   No transfusion given, surgery fixed the bleed.  She has a lot of joint pain, and Polymyalgia rheumatica,  rheumatoid arthritis, on Arancia and  plaquenil ,  Plavix , She now developed numbness in hands and feet.   Remains on PAP therapy and was las seen by Harlene Bogaert, NP   Now pending  upper Gi scope with duodenoscopy.      05-05-2024:  Tracey [name] is a 78 y.o. female with medical history significant for CAD on Plavix , polymyalgia rheumatica, breast cancer, GERD, history of Schatzki's ring status post dilation June 2025 being admitted to the hospital with several days of dyspnea with exertion, chest discomfort and melanotic stools for the last 4 days found to have acute on chronic anemia and concern for upper GI bleed.  History is provided by the patient as well as her daughter who is at the bedside, and a Designer, jewellery.  Tell me she was in her usual state of health until she started having some chest discomfort at rest and with exertion about 5 days ago.  The same day, she started having melanotic stools.  She also vomited that day, no hematemesis.  No frank blood in her stools.  She denies significant abdominal pain, fevers, chills or sharp stabbing chest pain.  Evaluation in the ER as detailed below shows  evidence of Hemoccult positive stool, and acute on chronic anemia.         Review of Systems: Out of a complete 14 system review, the patient complains of only the following symptoms, and all other reviewed systems are negative.:   SLEEPINESS ?  How likely are you to doze in the following situations: 0 = not likely, 1 = slight chance, 2 = moderate chance, 3 = high chance  Sitting and Reading? Watching Television? Sitting inactive in a public place (theater or meeting)? Lying down in the afternoon when circumstances permit? Sitting and talking to someone? Sitting quietly after lunch without alcohol ? In a car, while stopped for a few minutes in traffic? As a passenger in a car for an hour without a break?  Total = 8/ 24   FSS 49/ 63        Social History   Socioeconomic History   Marital status: Married    Spouse name: Not on file   Number of children: 2   Years of education: Not on file   Highest education level: Not on file  Occupational History   Occupation: retired  Tobacco Use   Smoking status: Former    Current packs/day: 1.00    Average packs/day: 1 pack/day for 32.0 years (32.0 ttl  pk-yrs)    Types: Cigarettes    Start date: 06/09/1992   Smokeless tobacco: Never   Tobacco comments:    quit at age 89  Vaping Use   Vaping status: Never Used  Substance and Sexual Activity   Alcohol  use: Yes    Alcohol /week: 1.0 standard drink of alcohol     Types: 1 Standard drinks or equivalent per week    Comment: 1 a day   Drug use: Never   Sexual activity: Yes    Birth control/protection: Surgical  Other Topics Concern   Not on file  Social History Narrative   Not on file   Social Drivers of Health   Financial Resource Strain: Not on file  Food Insecurity: No Food Insecurity (05/16/2024)   Hunger Vital Sign    Worried About Running Out of Food in the Last Year: Never true    Ran Out of Food in the Last Year: Never true  Transportation Needs: No  Transportation Needs (05/16/2024)   PRAPARE - Administrator, Civil Service (Medical): No    Lack of Transportation (Non-Medical): No  Physical Activity: Not on file  Stress: Not on file  Social Connections: Moderately Integrated (05/16/2024)   Social Connection and Isolation Panel    Frequency of Communication with Friends and Family: More than three times a week    Frequency of Social Gatherings with Friends and Family: Three times a week    Attends Religious Services: Never    Active Member of Clubs or Organizations: Yes    Attends Engineer, structural: More than 4 times per year    Marital Status: Married    Family History  Problem Relation Age of Onset   Breast cancer Mother        possible inflammatory breast cancer   Heart attack Father    Heart disease Father    ALS Sister    Breast cancer Maternal Grandmother 54   Heart attack Maternal Grandfather    Other Paternal Grandmother        ruptured appendix   Heart attack Paternal Grandfather    Breast cancer Maternal Aunt        dx in her 2s   Breast cancer Other        maternal great grandmother; dx in her 34s   Thyroid  disease Daughter     Past Medical History:  Diagnosis Date   Allergy    Arthritis    Breast cancer (HCC) 2014   ER+/PR+/HEr2-,    Bruises easily    Chronic kidney disease due to hypertension 05/25/2024   Colon polyps    2 polyps by report   Coronary artery disease involving native coronary artery of native heart without angina pectoris 01/01/2014   NSTEMI in January 2019 in [place] >> s/p DES x2 to the LAD (procedure complicated by stent thrombosis) Myoview  05/2020: EF 72, no infarct or ischemia; low risk Echocardiogram 05/2019: EF 60-65, GLS -21.5 Cardiac catheter[facility] at [facility] in 08/2017: Normal left main, normal LCx, normal RCA; LAD/diagonal 95%>> PCI    Diverticulosis    GERD (gastroesophageal reflux disease)    occasionally takes Nexium     Gout     History of bladder infections    many yrs ago   History of bronchitis    last time at least 36yrs ago   History of hiatal hernia    History of stress incontinence    Hyperlipidemia    takes Pravastatin  daily  Hypertension    Insomnia    takes Ambien nightly prn   NSTEMI (non-ST elevated myocardial infarction) (HCC) 08/2017   OSA (obstructive sleep apnea)    on CPAP   Osteoarthritis    Peripheral edema    takes Furosemide  daily prn   Pneumonia    PONV (postoperative nausea and vomiting)    Postmenopausal hormone therapy    Radiation 07/27/13-09/07/13   Right Breast x 31 treatments   Rhinitis    uses Flonase  prn   Sinus congestion    Status post breast reconstruction 10/15/2014   Bilateral implant removal and DIEP performed in [place], [place]   Tachycardia    takes Metoprolol  daily   Ventricular tachycardia, non-sustained (HCC)    during sleep study 2009 with normal cardiac workup    Past Surgical History:  Procedure Laterality Date   ABDOMINAL HYSTERECTOMY     with BSO   APPENDECTOMY     ARTERY BIOPSY Left 05/15/2022   Procedure: BIOPSY TEMPORAL ARTERY;  Surgeon: Gretta Lonni PARAS, MD;  Location: [facility] OR;  Service: Vascular;  Laterality: Left;   AXILLARY SENTINEL NODE BIOPSY Right 05/23/2013   Procedure: AXILLARY SENTINEL NODE BIOPSY;  Surgeon: Krystal PARAS Russell, MD;  Location: [facility] OR;  Service: General;  Laterality: Right;  nuc med injection 7:00   BALLOON DILATION N/A 02/08/2024   Procedure: MERRILL HODGKIN;  Surgeon: Charlanne Groom, MD;  Location: [facility];  Service: Gastroenterology;  Laterality: N/A;   BREAST RECONSTRUCTION WITH PLACEMENT OF TISSUE EXPANDER AND FLEX HD (ACELLULAR HYDRATED DERMIS) Bilateral 05/23/2013   Procedure: BILATERAL BREAST RECONSTRUCTION WITH PLACEMENT OF TISSUE EXPANDER AND FLEX HD;  Surgeon: Alm Sick, MD;  Location: [facility] OR;  Service: Plastics;  Laterality: Bilateral;   CARDIAC CATHETERIZATION  1999   CATARACT EXTRACTION, BILATERAL  Bilateral    CHOLECYSTECTOMY     COSMETIC SURGERY     ESOPHAGOGASTRODUODENOSCOPY N/A 02/08/2024   Procedure: EGD (ESOPHAGOGASTRODUODENOSCOPY);  Surgeon: Charlanne Groom, MD;  Location: [facility];  Service: Gastroenterology;  Laterality: N/A;   ESOPHAGOGASTRODUODENOSCOPY N/A 05/17/2024   Procedure: EGD (ESOPHAGOGASTRODUODENOSCOPY);  Surgeon: Suzann Inocente HERO, MD;  Location: [facility];  Service: Gastroenterology;  Laterality: N/A;   EXPLORATORY LAPAROTOMY     OTHER SURGICAL HISTORY  09/2017   had cyst removal from spine    PERCUTANEOUS CORONARY STENT INTERVENTION (PCI-S)  08/2017   done in [place]      REMOVAL OF BILATERAL TISSUE EXPANDERS WITH PLACEMENT OF BILATERAL BREAST IMPLANTS Bilateral 05/17/2014   Procedure: REMOVAL OF BILATERAL TISSUE EXPANDERS WITH PLACEMENT OF BILATERAL BREAST IMPLANTS FOR RECONSTRUCTION;  Surgeon: Alm Sick, MD;  Location: [facility] OR;  Service: Plastics;  Laterality: Bilateral;   REVERSE SHOULDER ARTHROPLASTY Right 05/13/2023   Procedure: REVERSE SHOULDER ARTHROPLASTY;  Surgeon: Melita Drivers, MD;  Location: [facility] ORS;  Service: Orthopedics;  Laterality: Right;   TONSILLECTOMY     TOTAL KNEE ARTHROPLASTY Right 07/26/2018   Procedure: TOTAL KNEE ARTHROPLASTY;  Surgeon: Ernie Cough, MD;  Location: [facility] ORS;  Service: Orthopedics;  Laterality: Right;  70 mins   TOTAL KNEE ARTHROPLASTY Left 12/02/2021   Procedure: TOTAL KNEE ARTHROPLASTY;  Surgeon: Ernie Cough, MD;  Location: [facility] OR[facility];  Service: Orthopedics;  Laterality: Left;   TOTAL MASTECTOMY Bilateral 05/23/2013   Procedure: TOTAL MASTECTOMY;  Surgeon: Krystal PARAS Russell, MD;  Location: [facility] OR;  Service: General;  Laterality: Bilateral;     Current Outpatient Medications on File Prior to Visit  Medication Sig Dispense Refill   allopurinol  (ZYLOPRIM ) 100 MG tablet TAKE 1 TABLET  BY MOUTH ONCE DAILY 90 tablet 0   Budeson-Glycopyrrol-Formoterol  (BREZTRI  AEROSPHERE) 160-9-4.8 MCG/ACT AERO Inhale 2 puffs into the lungs in  the morning and at bedtime. (Patient taking differently: Inhale 2 puffs into the lungs as needed (wheezing/SOB).) 5.9 g 0   Cholecalciferol  (VITAMIN D3) 50 MCG (2000 UT) TABS Take 2,000 Units by mouth daily.     clopidogrel  (PLAVIX ) 75 MG tablet TAKE 1 TABLET(75 MG) BY MOUTH DAILY 90 tablet 3   cyanocobalamin  (,VITAMIN B-12,) 1000 MCG/ML injection Inject 1,000 mcg into the muscle every 30 (thirty) days.     estradiol (ESTRACE) 0.1 MG/GM vaginal cream Place 1 Applicatorful vaginally 2 (two) times a week.     fluticasone  (FLONASE ) 50 MCG/ACT nasal spray Place 2 sprays into both nostrils daily.     folic acid  (FOLVITE ) 1 MG tablet Take 1 mg by mouth daily.     furosemide  (LASIX ) 40 MG tablet Take 40 mg by mouth daily as needed for fluid.      hydroxychloroquine  (PLAQUENIL ) 200 MG tablet Take 200 mg by mouth daily.     ketoconazole  (NIZORAL ) 2 % shampoo Apply 1 Application topically as needed.     lidocaine  (LIDODERM ) 5 % Place 1 patch onto the skin daily. Remove & Discard patch within 12 hours or as directed by MD (Patient taking differently: Place 1 patch onto the skin daily as needed. Remove & Discard patch within 12 hours or as directed by MD) 9 patch 0   loratadine  (CLARITIN ) 10 MG tablet Take 10 mg by mouth daily.     metoprolol  succinate (TOPROL -XL) 25 MG 24 hr tablet TAKE 1 TABLET(25 MG) BY MOUTH DAILY 90 tablet 1   Multiple Vitamin (MULTI-VITAMIN) tablet Take 1 tablet by mouth daily.     MYRBETRIQ 50 MG TB24 tablet Take 1 tablet by mouth daily.     nitroGLYCERIN  (NITROSTAT ) 0.4 MG SL tablet Place 1 tablet (0.4 mg total) under the tongue every 5 (five) minutes as needed for chest pain. 25 tablet 7   ORENCIA CLICKJECT 125 MG/ML SOAJ Inject 125 mg into the skin once a week.     pantoprazole  (PROTONIX ) 40 MG tablet Take 1 tablet (40 mg total) by mouth 2 (two) times daily. 60 tablet 3   polyethylene glycol (MIRALAX  / GLYCOLAX ) 17 g packet Take 17 g by mouth daily as needed for mild constipation. 14  each 0   predniSONE  (DELTASONE ) 1 MG tablet Take 2 mg by mouth daily with breakfast.     Propylene Glycol (SYSTANE COMPLETE) 0.6 % SOLN Place 2 drops into both eyes 2 (two) times daily.     rosuvastatin  (CRESTOR ) 20 MG tablet TAKE 1 TABLET(20 MG) BY MOUTH DAILY 30 tablet 0   sertraline  (ZOLOFT ) 100 MG tablet Take 0.5 tablets (50 mg total) by mouth daily. 90 tablet 1   testosterone  cypionate (DEPOTESTOSTERONE CYPIONATE) 200 MG/ML injection Inject 200 mg into the muscle every 30 (thirty) days.     traMADol  (ULTRAM ) 50 MG tablet Take 50-100 mg by mouth 2 (two) times daily as needed.     traZODone  (DESYREL ) 50 MG tablet Take 25 mg by mouth at bedtime as needed.     No current facility-administered medications on file prior to visit.    Allergies  Allergen Reactions   Iodine  Hives and Swelling    Other Reaction(s): Unknown   Pseudoeph-Hydrocodone -Gg Other (See Comments)    hallucinations   Benadryl  [Diphenhydramine ] Other (See Comments)    hallucinations   Codeine  Other Reaction(s): Not available   Cyclobenzaprine      Other Reaction(s): Not available   Hydrocodone      Other Reaction(s): Not available   Ivp Dye [Iodinated Contrast Media] Hives   Methocarbamol  Other (See Comments)    NO MUSCLE RELAXER'S  Hallucinations   Oxycodone  Other (See Comments)    hallucinations   Shellfish Protein-Containing Drug Products     Other Reaction(s): Unknown   Sulfa Antibiotics Swelling   Tizanidine     Other Reaction(s): Not available   Tape Other (See Comments) and Dermatitis    tears skin ; ok to use paper tape  Adhesive agent (substance)     DIAGNOSTIC DATA (LABS, IMAGING, TESTING) - I reviewed patient records, labs, notes, testing and imaging myself where available.  Lab Results  Component Value Date   WBC 5.0 05/18/2024   HGB 8.3 (L) 05/18/2024   HCT 24.3 (L) 05/18/2024   MCV 91.0 05/18/2024   PLT 207 05/18/2024      Component Value Date/Time   NA 141 05/18/2024 1008   NA  143 01/20/2018 0952   NA 141 01/06/2017 1316   K 3.5 05/18/2024 1008   K 4.4 01/06/2017 1316   CL 108 05/18/2024 1008   CO2 19 (L) 05/18/2024 1008   CO2 23 01/06/2017 1316   GLUCOSE 137 (H) 05/18/2024 1008   GLUCOSE 84 01/06/2017 1316   BUN 12 05/18/2024 1008   BUN 18 01/20/2018 0952   BUN 23.3 01/06/2017 1316   CREATININE 1.21 (H) 05/18/2024 1008   CREATININE 1.27 (H) 01/28/2022 1515   CREATININE 0.9 01/06/2017 1316   CALCIUM  8.5 (L) 05/18/2024 1008   CALCIUM  10.0 01/06/2017 1316   PROT 5.7 (L) 05/15/2024 1243   PROT 6.6 05/22/2019 0756   PROT 7.2 01/06/2017 1316   ALBUMIN 3.8 05/15/2024 1243   ALBUMIN 4.5 05/22/2019 0756   ALBUMIN 4.3 01/06/2017 1316   AST 28 05/15/2024 1243   AST 26 03/28/2020 1443   AST 27 01/06/2017 1316   ALT 17 05/15/2024 1243   ALT 24 03/28/2020 1443   ALT 40 01/06/2017 1316   ALKPHOS 55 05/15/2024 1243   ALKPHOS 85 01/06/2017 1316   BILITOT 0.2 05/15/2024 1243   BILITOT 0.4 03/28/2020 1443   BILITOT 0.47 01/06/2017 1316   GFRNONAA 45 (L) 05/18/2024 1008   GFRNONAA 39 (L) 03/28/2020 1443   GFRAA 45 (L) 03/28/2020 1443   Lab Results  Component Value Date   CHOL 128 05/22/2019   HDL 58 05/22/2019   LDLCALC 49 05/22/2019   TRIG 121 05/22/2019   CHOLHDL 2.2 05/22/2019   No results found for: HGBA1C Lab Results  Component Value Date   VITAMINB12 825 05/16/2024   Component Ref Range & Units (hover) 2 wk ago (05/18/24) 2 wk ago (05/17/24) 2 wk ago (05/17/24) 3 wk ago (05/16/24) 3 wk ago (05/16/24) 3 wk ago (05/15/24) 3 wk ago (05/15/24)  WBC 5.0    5.4    RBC 2.67 Low     2.80 Low     Hemoglobin 7.3 Low  8.2 Low  8.2 Low  7.8 Low  7.9 Low  7.4 Low  7.4 Low   HCT 24.3 Low  26.6 Low  CM 27.1 Low  CM 26.0 Low  CM 25.3 Low  23.6 Low  CM 24.2 Low  CM  MCV 91.0    90.4    MCH 27.3    28.2    MCHC 30.0    31.2  RDW 15.3    15.7 High     Platelets 207    213    nRBC 0.0    0.0 CM        Seronegative rheumatoid arthritis 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 Body mass index is 27.25 kg/m.   Wt Readings from Last 3 Encounters:  06/06/24 149 lb (67.6 kg)  05/25/24 146 lb (66.2 kg)  05/17/24 145 lb 4.5 oz (65.9 kg)     Ht Readings from Last 3 Encounters:  06/06/24 5' 2 (1.575 m)  05/25/24 5' 2 (1.575 m)  05/17/24 5' 2 (1.575 m)      General: The patient is awake, alert and appears not in acute distress and well groomed. Head: Normocephalic, atraumatic.  Neck is supple.   Nasal airflow patent.    Overbite Dwan is seen.  Dental status:  biogical  Cardiovascular:  Regular rate and cardiac rhythm by pulse, without distended neck veins. Respiratory: no shortness of breath  Skin:  Without evidence of ankle edema, or rash. Cold and bluish feet.   Trunk: BMI is 27.3   She is easily short of breath.      NEUROLOGIC EXAM: The patient is awake and alert, oriented to place and time.   Memory subjective described as intact.  Attention span & concentration ability appears normal.   Speech is fluent,  without  dysarthria, dysphonia or aphasia.  Mood and affect are appropriate.   Neurological Examination: Mental Status: Intact. Language and speech are normal. No cognitive deficits. Cranial Nerves II-XII: Intact. PERL. EOMI. VFF. No nystagmus.  No facial droop.  No ptosis.  Hearing is  impaired.   The tongue is normal and midline.  Motor: joint stiffness, ROM restriction,  stooped posture. Reflexes: attenuated.  and symmetric .   78 y.o. year old female here with:    1) new onset neuropathy - has many reasons or potential causes.   Sensory neuropathy, gastroparesis, dysautonomia.  She is at high fall risk, and has   diffuse myalgia, multi joint pain, SOB, light headedness.   Autoimmune conditions certainly can contribute .  Leaving that to her rheumatologist.    Plan A)  Alpha -lipoic fatty acid.  Plan B ) if that fails , I will introduce a low dose of Lyrica.    2) CPAP use with oxygen  when travelling to altitudes. I will need to replace her machine , its pre pandemic,  she is listed as having a machine from 2022 ,but that one was RETURNED!.She needs oxygen  and mask fitting , can be switched to BiPAP or ASV. Donnice , please.      95% is 8 cm,  residual AHI is  12.4/h .  Still benefits her  when SOB at night.   Continue using PAP !    3) Start a prenatal vitamin for all blood building vitamins and minerals.    I would like to thank Larnell Hamilton, Md [address] [place],  [place] [address] for allowing me to meet with this pleasant patient.   Sleep Clinic Patients are generally offered input on sleep hygiene, life style changes and how to improve compliance with medical treatment where applicable.  Review and reiteration of good sleep hygiene measures is offered to any sleep clinic patient, be it in the first consultation or with any follow up visits.  Any patient with sleepiness should be cautioned not to drive, work at heights, or operate dangerous or heavy equipment when feeling tired or sleepy.    The patient will be seen in follow-up in the sleep clinic at [facility] for discussion of test results, sleep related symptoms and treatment compliance review, further management strategies, etc.   The referring provider will be notified of the test results.   The patient's condition requires frequent monitoring and adjustments in the treatment plan, reflecting the ongoing complexity of care.  This provider is the continuing focal point for all needed services for this condition.  After spending a total time of  45  minutes face to face and time for  history taking, physical and neurologic examination,  review of laboratory studies,  personal review of imaging studies, reports and results of other testing and review of referral information / records as far as provided in visit,   Electronically signed by: Dedra Gores, MD 06/06/2024 11:50 AM  [facility] and [facility] Sleep Board certified by The [employer] of Sleep Medicine and Diplomate of the [employer] of Sleep Medicine. Board certified In Neurology through the ABPN, Fellow of the [employer] of Neurology.

## 2024-06-07 ENCOUNTER — Ambulatory Visit (HOSPITAL_COMMUNITY)
Admission: RE | Admit: 2024-06-07 | Discharge: 2024-06-07 | Disposition: A | Discharge status: Home / Self Care | Source: Ambulatory Visit | Attending: Internal Medicine | Admitting: Internal Medicine

## 2024-06-07 VITALS — BP 136/47 | HR 59 | Temp 97.6°F

## 2024-06-07 DIAGNOSIS — D509 Iron deficiency anemia, unspecified: Secondary | ICD-10-CM | POA: Diagnosis not present

## 2024-06-07 DIAGNOSIS — D649 Anemia, unspecified: Secondary | ICD-10-CM | POA: Diagnosis not present

## 2024-06-07 MED ORDER — SODIUM CHLORIDE 0.9 % IV SOLN: INTRAVENOUS | Status: DC | PRN

## 2024-06-07 MED ORDER — SODIUM CHLORIDE 0.9 % IV SOLN
510.0000 mg | Freq: Once | INTRAVENOUS | Status: AC
  Administered 2024-06-07: 510 mg via INTRAVENOUS
  Filled 2024-06-07: qty 17

## 2024-06-07 NOTE — Progress Notes (Signed)
 Diagnosis: Iron deficiency anemia    Provider: Elgin Copa    Procedure: Feraheme 510 mg     Note: pt received Feraheme 510 mg IV, patient tolerated well, AVS given, Vital signs stable  this was 2/2, pt to follow up with MD for recheck of levels.  Pt given signs and symptoms of allergic reactions and will notify us  if any concerns.

## 2024-06-12 ENCOUNTER — Other Ambulatory Visit: Payer: Self-pay | Admitting: Neurology

## 2024-06-12 ENCOUNTER — Other Ambulatory Visit: Payer: Self-pay | Admitting: *Deleted

## 2024-06-12 DIAGNOSIS — M5440 Lumbago with sciatica, unspecified side: Secondary | ICD-10-CM | POA: Diagnosis not present

## 2024-06-12 DIAGNOSIS — R0609 Other forms of dyspnea: Secondary | ICD-10-CM

## 2024-06-12 DIAGNOSIS — G4739 Other sleep apnea: Secondary | ICD-10-CM

## 2024-06-12 NOTE — Progress Notes (Signed)
 This patient has a CPAP and [place] I need the hook up tubing between oxygen  and machine to be renewed.  She is travelling soon  and may leave before we have her new titration study, in that case she continues to use the old machine and the personal oxygen  concentrator , and just needs the hook up-  She has a sleep study with re-titration ordered in our lab,  her machine is pre=pandemic and needs replaced. She needs oxygen   during the study and mask fitting , can be switched to BiPAP or ASV. Donnice , please.   Dedra Gores, MD

## 2024-06-13 ENCOUNTER — Telehealth: Payer: Self-pay | Admitting: Pediatrics

## 2024-06-13 NOTE — Telephone Encounter (Signed)
 Inbound call from patient stating that she needs to have a EGD and colon before the 5th of December.Patient is requesting a call back. Please advise.

## 2024-06-13 NOTE — Telephone Encounter (Signed)
 Patient had EGD with Dr Suzann 05/17/24. Needs repeat EGD 8 weeks from that time to assess for healing of ulcer.  Patient of Dr Ira. Must be hospital case due to history of difficult intubation.

## 2024-06-15 ENCOUNTER — Telehealth: Payer: Self-pay | Admitting: Neurology

## 2024-06-15 ENCOUNTER — Encounter: Payer: Self-pay | Admitting: Neurology

## 2024-06-15 ENCOUNTER — Ambulatory Visit: Admitting: Neurology

## 2024-06-15 DIAGNOSIS — G4739 Other sleep apnea: Secondary | ICD-10-CM | POA: Diagnosis not present

## 2024-06-15 DIAGNOSIS — Z9981 Dependence on supplemental oxygen: Secondary | ICD-10-CM

## 2024-06-15 NOTE — Telephone Encounter (Signed)
 CPAP Medicare/mutual of [place] no auth req.  Spoke with the patient to schedule her SS. She is scheduled for tonight 06/15/24 at 9 pm. I sent her a mychart message as well.

## 2024-06-20 NOTE — Telephone Encounter (Signed)
 Lets do in Jan 2025 RG

## 2024-06-21 DIAGNOSIS — M5416 Radiculopathy, lumbar region: Secondary | ICD-10-CM | POA: Diagnosis not present

## 2024-06-21 DIAGNOSIS — M5440 Lumbago with sciatica, unspecified side: Secondary | ICD-10-CM | POA: Diagnosis not present

## 2024-06-22 ENCOUNTER — Telehealth: Payer: Self-pay | Admitting: Neurology

## 2024-06-22 NOTE — Telephone Encounter (Signed)
 Spoke with Radio Producer. Pt completed CPAP titration study 06/15/24. Pending Dr. Lionell review. We could not see where pt was provided mask at last OV 06/06/24 or night of study.   Pt should contact current DMT: [employer] to discuss having current mask looked at/troubleshoot until we can call her about CPAP titration results. She will also be getting new mask/supplies at that point.   Called pt at [phone]. Relayed above message. She verbalized understanding and she will call DME to set up time to have current mask looked at. Aware we will call with results once available for titration study.

## 2024-06-22 NOTE — Telephone Encounter (Signed)
 Patient returning call.

## 2024-06-22 NOTE — Telephone Encounter (Signed)
 Pt called stating that the mask that she was given when she came to the sleep study in the office, is not working for her and air is leaking out. She is wanting to speak to the nurse to find out how if there is a piece missing or if she is doing something wrong and is needing to be shown how to properly use it. Pt stated that she will be in meetings all day but pls LVM if she does not answer and she will call right back. Please advise.

## 2024-06-22 NOTE — Telephone Encounter (Signed)
 Left message for patient to call back

## 2024-06-23 NOTE — Telephone Encounter (Signed)
 Called patient to scheduled hospital EGD for next available dates in January. States she will be in [place]  until March or April of next year. Requesting this be done now.

## 2024-06-23 NOTE — Telephone Encounter (Signed)
 Sure thing Can do when she is back from  [place]  RG

## 2024-06-26 NOTE — Procedures (Signed)
 [facility] PAP TITRATION INTERPRETATION REPORT   STUDY DATE: 06/15/2024      PATIENT NAME:  Tracey [name]         DATE OF BIRTH:  [birth year]  PATIENT ID:  [id number]    TYPE OF STUDY:  CPAP  READING PHYSICIAN: DEDRA GORES, MD SCORING TECHNICIAN: Delon Sprung, RPSGT   HISTORY: This 78 year-old Female reports . The Epworth Sleepiness Scale was 8 out of 24 (scores above or equal to 10 are suggestive of hypersomnolence). Fatigue SS endorsed at 49/ 63 points.  The patient will continue to use an oxygen  concentrator when in the [facility] with 2 L of 02 /min bled onto her PAP device.  This study was ordered to see if she can be titrated to oxygen  , or if she needs a switch to BiPAP. Tracey [name] is a 78 y.o. female patient who is here for revisit 06/06/2024 for MS and physical decline, history of complex sleep apnea. Anxious and a bit tearful today. She needed oxygen  in addition to PAP when traveling to the [facility]. Was hospitalized with a bleeding ulcer, had lost a lot of blood, hemoglobin went to the 7 range. No transfusion given, surgery fixed the bleed. She has a lot of joint pain,  Polymyalgia rheumatica, rheumatoid arthritis, on Arancia and plaquenil , Plavix , She now developed numbness in hands and feet. Remains on PAP therapy and was last seen by Harlene Bogaert, NP for compliance  Now pending upper Gi scope with duodenoscopy.  05-05-2024: Tracey [name] is a 78 y.o. female with medical history significant for CAD on Plavix , polymyalgia rheumatica, breast cancer, GERD, history of Schatzki's ring status post dilation June 2025 being admitted to the hospital with several days of dyspnea with exertion, chest discomfort and melanotic stools for the last 4 days found to have acute on chronic anemia and concern for upper GI bleed. History is provided by the patient as well as her daughter who is at the bedside, and a Designer, [employer]ellery. Tell me she was in  her usual state of health until she started having some chest discomfort at rest and with exertion about 5 days ago. The same day, she started having melanotic stools. She also vomited that day, no hematemesis. No frank blood in her stools. She denies significant abdominal pain, fevers, chills or sharp stabbing chest pain. Evaluation in the ER as detailed below shows evidence of Hemoccult positive stool, and acute on chronic anemia   DESCRIPTION: A sleep technologist was in attendance for the duration of the recording.  Data collection, scoring, video monitoring, and reporting were performed in compliance with the AASM Manual for the Scoring of Sleep and Associated Events; (Hypopnea is scored based on the criteria listed in Section VIII D. 1b in the AASM Manual V2.6 using a 4% oxygen  desaturation rule or Hypopnea is scored based on the criteria listed in Section VIII D. 1a in the AASM Manual V2.6 using 3% oxygen  desaturation and /or arousal rule).  A physician certified by the American Board of Sleep Medicine reviewed each epoch of the study.  ADDITIONAL INFORMATION:  Height: 62.0 in Weight: 149 lb (BMI 27) Neck Size: 13.0     MEDICATIONS: Abatacept Allopurinol  100 MG Alpha-Lipoic Acid ALPRAZolam 0.25 mg Oral At bedtime PRN Budeson-Glycopyrrol-Formoterol  160-9-4.8 MCG/ACT Cholecalciferol  2,000 Units Daily Clopidogrel  Bisulfate 75 MG Cyanocobalamin  1,000 mcg Estradiol 0.1 MG/GM 1 Fluticasone  Propionate 50 MCG/ACT 2 Folic Acid  1 mg Daily Furosemide  40 mg Daily PRN  Hydroxychloroquine  Sulfate 200 mg Daily Ketoconazole  2 % 1 Application As needed Lidocaine  5 % Loratadine  10 mg Daily Metoprolol  Succinate 25 MG Mirabegron 50 MG 1 tablet Daily Multiple Vitamin 1 tablet Daily Nitroglycerin  0.4 mg Pantoprazole  Sodium 40 mg Polyethylene Glycol 3350  predniSONE  2 mg Propylene Glycol 0.6 % Rosuvastatin  Calcium  20 MG Sertraline  HCl 50 mg Oral Daily Testosterone  Cypionate 200 mg traMADol  HCl 50-100 mg traZODone  HCl 25 mg     SLEEP CONTINUITY AND SLEEP ARCHITECTURE:  started at 5 cm water  pressure on CPAP and started on an EPR of 3 cm water  (?)  ResMed Airfit F 40 in small  size, a FFM-    Lights off was at 22:21: and lights on 05:12: (6.9 hours in bed). Total sleep time was 371.0 minutes (0.0% supine;  60.6% lateral;  0.0% prone, 0.8% REM sleep), with a normal sleep efficiency at 90.2%. Sleep latency was normal at 13.0 minutes.  Of the total sleep time, the percentage of stage N1 sleep was 2.6%, stage N2 sleep was 96.6%, stage N3 sleep was 0.0%, and REM sleep was 0.8%. There were 1 Stage R periods observed on this study night, 5 awakenings (i.e. transitions to Stage W from any sleep stage), and 24.0 total stage transitions.  Wake after sleep onset (WASO) time accounted for 27 minutes.  AROUSAL: There were 3.2 arousals/hour.  Of these, 5 were identified as respiratory-related arousals (0.8 /h), 0 were PLM-related arousals (0.0 /h), and 15 were non-specific arousals (2.4 /h)  RESPIRATORY MONITORING:  Based on CMS criteria (using a 4% oxygen  desaturation rule for scoring hypopneas), there were 18 apneas (7 obstructive; 11 central; 0 mixed), and 57 hypopneas.  Central apneas dominated . The  Apnea index was 2.9/h . Hypopnea index was 9.2/h . The apnea-hypopnea index was 12.1/h  overall (0.0 supine, 40.0 non-supine; 40.0/h in  REM, 11.9/h in NREM).  There were 0 respiratory effort-related arousals (RERAs).   OXIMETRY: Total sleep time spent at, or below 88% was 0.1 minutes, or 0.0% of total sleep time. (nadir during sleep 87%) from a mean of 96%)..Snoring was classified as moderate.   EKG: The average heart rate during sleep was 54 bpm.  The maximum heart rate during sleep was 66 bpm. The maximum heart rate during recording was 73 bpm.  BODY POSITION: Duration of total sleep and percent of total sleep in their respective position is as follows: supine 00 minutes (0.0%), non-supine 371.0 minutes (100.0%);  left 225 minutes  (60.6%),  Total supine REM sleep time was 00 minutes (0.0% of total REM sleep). LIMB MOVEMENTS: There were 0 periodic limb movements of sleep (0.0/h).   IMPRESSION:   1. Central sleep apnea was again documented, the patient responded well to CPAP treatment and tolerated a FFM F 40 in small size, no oxygen  was needed.  The patient experienced  few apneas or hypopneas at the final pressure of 10 cm water .    2. Total sleep time was within normal limits.  The quality of  sleep improved with positive airway pressure.  no prolonged hypoxia was found, and no oxygen  was needed during this titration at local altitude.        RECOMMENDATIONS: CPAP treatment will continue at a goal of 10 cm water , 3 cm EPR,  Well  tolerated was a FFM F 40 in small size, no oxygen  was needed.  The patient experienced  few apneas or hypopneas at the final pressure of 10 cm water .  This does not mean that  the patient can expect the same results while staying at high altitude- she should use 2 L 02 /minute through her CPAP at night while in the [facility].      DEDRA GORES,  MD             [facility] CPAP/Bilevel Report    General Information  Name: DEZARIA, METHOT BMI: 27 Physician: ,   ID: [id number] Height: 62 in Technician: Jackson Nest  Sex: Female Weight: 149 lb Record: [id number]  Age: 36 [Feb 10, 1946] Date: 06/15/2024 Scorer: Nest Jackson   Recommended Settings IPAP: N/A cmH20 EPAP: N/A cmH2O AHI: N/A AHI (4%): N/A   Pressure IPAP/EPAP 00 08 / 00 05 06 07 09 10 11   O2 Vol 0.0 0.0 0.0 0.0 0.0 0.0 0.0 0.0  Time TRT 0.28m 19.47m 35.32m 24.27m 66.77m 98.59m 100.69m 68.33m   TST 0.20m 18.56m 22.18m 24.3m 66.76m 74.79m 100.56m 66.98m  Sleep Stage % Wake 0.0 7.7 37.1 0.0 0.0 24.0 0.0 3.6   % REM 0.0 0.0 0.0 0.0 4.5 0.0 0.0 0.0   % N1 0.0 11.1 6.8 0.0 0.8 4.0 2.5 0.0   % N2 0.0 88.9 93.2 100.0 94.7 96.0 97.5 100.0   % N3 0.0 0.0 0.0 0.0 0.0 0.0 0.0 0.0   Respiratory Total Events 0 11 12 0 25 20 3 4    Obs. Apn. 0 0 0 0 1 1 2 3    Mixed Apn. 0 0 0 0 0 0 0 0   Cen. Apn. 0 1 0 0 2 7 1  0   Hypopneas 0 10 12 0 22 12 0 1   AHI 0.00 36.67 32.73 0.00 22.56 16.11 1.80 3.64   Supine AHI 0.00 0.00 0.00 0.00 0.00 0.00 0.00 0.00   Prone AHI 0.00 0.00 0.00 0.00 0.00 0.00 0.00 0.00   Side AHI 0.00 0.00 0.00 0.00 0.00 4.07 1.80 3.64  Respiratory (4%) Hypopneas (4%) 0.00 10.00 12.00 0.00 22.00 12.00 0.00 1.00   AHI (4%) 0.00 36.67 32.73 0.00 22.56 16.11 1.80 3.64   Supine AHI (4%) 0.00 0.00 0.00 0.00 0.00 0.00 0.00 0.00   Prone AHI (4%) 0.00 0.00 0.00 0.00 0.00 0.00 0.00 0.00   Side AHI (4%) 0.00 0.00 0.00 0.00 0.00 4.07 1.80 3.64  Desat Profile <= 90% 0.62m 0.35m 0.72m 0.41m 0.71m 7.7m 0.32m 0.75m   <= 80% 0.4m 0.107m 0.54m 0.10m 0.98m 6.83m 0.1m 0.83m   <= 70% 0.43m 0.69m 0.2m 0.80m 0.71m 6.29m 0.4m 0.76m   <= 60% 0.65m 0.46m 0.70m 0.90m 0.60m 6.74m 0.21m 0.8m  Arousal Index Apnea 0.0 0.0 0.0 0.0 0.9 0.8 0.0 0.9   Hypopnea 0.0 3.3 0.0 0.0 0.9 0.0 0.0 0.0   LM 0.0 0.0 0.0 0.0 0.0 0.0 0.0 0.0   Spontaneous 0.0 3.3 0.0 0.0 0.9 4.8 2.4 2.7

## 2024-06-27 NOTE — Progress Notes (Unsigned)
 06/28/2024 Tracey [name] [id number] 12/01/1945  Referring provider: Larnell Hamilton, MD Primary GI doctor: Dr. Charlanne  ASSESSMENT AND PLAN:  Dysphagia/atypical chest pain/melena 01/2024 EGD with dilatation of Schatzki ring, 5 cm HH no Cameron erosions She continues to have regurgitation and spasm, feels a spasm She leaves Dec 6th for 4 months 05/17/2024 EGD during admission torturous esophagus nonobstructing moderate Schatzki ring not dilated patient not completely hold Plavix  5 days, nonbleeding gastric ulcer clean-based status post clips puraStat single Cameron erosion, medium size hiatal hernia erythematous mucosa stomach normal duodenum bulb Pantoprazole  40 mg twice daily Avoid NSAIDs Repeat outpatient EGD Dr. Charlanne for healing as well as dilatation holding Plavix  5 days by Dr. Wonda, has appointment 07/05/2024 with cardiology -Will be holding plavix  11/20 for 5 days for back injections, requesting EGD prior to living for the winter -EGD scheduled with Dr. Suzann, will hold plavix  5 days and get permission, I discussed risks of EGD with patient today, including risk of sedation, bleeding or perforation. Patient provides understanding and gave verbal consent to proceed. -Consider barium swallow with history of spasm -Will do trial of levsin for esophageal spasm  IDA/B12 def 05/18/2024  HGB 8.3 MCV 91.0 Platelets 207 05/16/2024 Iron 17 Ferritin 27 B12 825 Recent Labs    05/15/24 1102 05/15/24 1434 05/15/24 2133 05/16/24 0433 05/16/24 1735 05/17/24 1101 05/17/24 1707 05/18/24 0347 05/18/24 1008  HGB 8.5* 7.4* 7.4* 7.9* 7.8* 8.2* 8.2* 7.3* 8.3*  Unable to tolerate oral iron due to constipation With history of Cameron lesions and anemia suggest hematology referral for monitoring outpatient and supportive care S/p IV iron infusions outpatient x 2  No further melena since hospital visit, no SOB, no CP - recheck CB[facility]  [facility]S Colonoscopy 5 to 6 years per notes with Dr.  Rollin Will try to get records  CAD on Plavix  Will need to get permission to hold Plavix  5 days by Dr. Wonda, has appointment 07/05/2024 Will be holding plavix  11/20 for 5 days for back injections Hold Plavix  for 5 days before procedure will instruct when and how to resume after procedure.  Patient understands that there is a low but real risk of cardiovascular event such as heart attack, stroke, or embolism /  thrombosis, or ischemia while off Plavix .  The patient consents to proceed.  Will communicate by phone or EMR with patient's prescribing provider to confirm that holding Plavix  is reasonable in this case.   Rheumatoid arthritis  Follows with [facility] rheumatology for PMR on Orencia since February  History of breast cancer Status postmastectomy and adjuvant radiation pleated 5 years of antiestrogen therapy in 2019  Chronic constipation Typically takes dates and a mixture of stool softeners in order to have more normal bowel movements   Patient Care Team: Larnell Hamilton, MD as PCP - General (Internal Medicine) Wonda Sharper, MD as PCP - Cardiology (Cardiology) Cleotilde Ronal RAMAN, MD as Consulting Physician (Gynecology) Lelon Hamilton ONEIDA DEVONNA as Physician Assistant (Cardiology) Alverda Mardy SAUNDERS, MD as Consulting Physician (Rheumatology)  HISTORY OF PRESENT ILLNESS: 78 y.o. female with a past medical history listed below presents for evaluation of dysphagia.   Patient recently seen in the hospital 9/29 through 10/2 for dysphagia chest pain and GI bleed.  Discussed the use of AI scribe software for clinical note transcription with the patient, who gave verbal consent to proceed.  History of Present Illness   Tracey [name] is a 78 year old female with a history of hiatal hernia and anemia  who presents with difficulty swallowing and chest pain.  She has a history of a large hiatal hernia diagnosed during an endoscopy in June, which was followed by dilation. She was  hospitalized due to trouble swallowing, chest pain, and melena, which led to significant blood loss and anemia. She has received two iron infusions to address the anemia.  She continues to experience difficulty swallowing, describing it as food 'stopping right here' and needing to 'wait till I relax' to swallow. She takes numerous pills daily and struggles to swallow them without regurgitation. She experiences spasms that cause the swallowing difficulty, which she describes as 'it closes up like stops.'  She takes pantoprazole  twice daily, increased from once daily, to manage her symptoms. Diet Coke helps calm the spasms, although warm beverages like coffee do not seem to help.  She is also planning to hold her Plavix  for five days starting November 20th for back injections.  No current melena or hematochezia, but she remains vigilant, checking after each bowel movement. Her previous stools were 'charcoal black' during blood loss. No shortness of breath or chest pain currently.  Her last hemoglobin check was during her hospital stay, and she has not had any lab work since then. Her hemoglobin was just high enough to be discharged from the hospital.  Her father had similar swallowing issues.  She and her husband travel to [place]  for the ski season, as her husband is an avid skier.     She  reports that she has quit smoking. Her smoking use included cigarettes. She started smoking about 32 years ago. She has a 32.1 pack-year smoking history. She has never used smokeless tobacco. She reports current alcohol  use of about 1.0 standard drink of alcohol  per week. She reports that she does not use drugs.  RELEVANT GI HISTORY, IMAGING AND LABS: Results   LABS Hemoglobin: 5 (06/18/2024)  DIAGNOSTIC Endoscopy: Hiatal hernia, dilation performed (01/2024)      CBC    Component Value Date/Time   WBC 5.0 05/18/2024 0347   RBC 2.67 (L) 05/18/2024 0347   HGB 8.3 (L) 05/18/2024 1008   HGB 14.0  03/28/2020 1443   HGB 13.5 01/06/2017 1316   HCT 24.3 (L) 05/18/2024 0347   HCT 41.4 01/06/2017 1316   PLT 207 05/18/2024 0347   PLT 207 03/28/2020 1443   PLT 162 01/06/2017 1316   MCV 91.0 05/18/2024 0347   MCV 93.7 01/06/2017 1316   MCH 27.3 05/18/2024 0347   MCHC 30.0 05/18/2024 0347   RDW 15.3 05/18/2024 0347   RDW 14.0 01/06/2017 1316   LYMPHSABS 1.6 05/19/2023 0452   LYMPHSABS 1.7 01/06/2017 1316   MONOABS 0.8 05/19/2023 0452   MONOABS 0.4 01/06/2017 1316   EOSABS 0.2 05/19/2023 0452   EOSABS 0.1 01/06/2017 1316   BASOSABS 0.0 05/19/2023 0452   BASOSABS 0.0 01/06/2017 1316   Recent Labs    05/15/24 1102 05/15/24 1434 05/15/24 2133 05/16/24 0433 05/16/24 1735 05/17/24 1101 05/17/24 1707 05/18/24 0347 05/18/24 1008  HGB 8.5* 7.4* 7.4* 7.9* 7.8* 8.2* 8.2* 7.3* 8.3*    CMP     Component Value Date/Time   NA 141 05/18/2024 1008   NA 143 01/20/2018 0952   NA 141 01/06/2017 1316   K 3.5 05/18/2024 1008   K 4.4 01/06/2017 1316   CL 108 05/18/2024 1008   CO2 19 (L) 05/18/2024 1008   CO2 23 01/06/2017 1316   GLUCOSE 137 (H) 05/18/2024 1008   GLUCOSE 84 01/06/2017 1316  BUN 12 05/18/2024 1008   BUN 18 01/20/2018 0952   BUN 23.3 01/06/2017 1316   CREATININE 1.21 (H) 05/18/2024 1008   CREATININE 1.27 (H) 01/28/2022 1515   CREATININE 0.9 01/06/2017 1316   CALCIUM  8.5 (L) 05/18/2024 1008   CALCIUM  10.0 01/06/2017 1316   PROT 5.7 (L) 05/15/2024 1243   PROT 6.6 05/22/2019 0756   PROT 7.2 01/06/2017 1316   ALBUMIN 3.8 05/15/2024 1243   ALBUMIN 4.5 05/22/2019 0756   ALBUMIN 4.3 01/06/2017 1316   AST 28 05/15/2024 1243   AST 26 03/28/2020 1443   AST 27 01/06/2017 1316   ALT 17 05/15/2024 1243   ALT 24 03/28/2020 1443   ALT 40 01/06/2017 1316   ALKPHOS 55 05/15/2024 1243   ALKPHOS 85 01/06/2017 1316   BILITOT 0.2 05/15/2024 1243   BILITOT 0.4 03/28/2020 1443   BILITOT 0.47 01/06/2017 1316   GFRNONAA 45 (L) 05/18/2024 1008   GFRNONAA 39 (L) 03/28/2020 1443    GFRAA 45 (L) 03/28/2020 1443      Latest Ref Rng & Units 05/15/2024   12:43 PM 05/16/2023    9:08 PM 01/28/2022    3:15 PM  Hepatic Function  Total Protein 6.5 - 8.1 g/dL 5.7  6.3  6.7   Albumin 3.5 - 5.0 g/dL 3.8  3.8    AST 15 - 41 U/L 28  25  19    ALT 0 - 44 U/L 17  19  14    Alk Phosphatase 38 - 126 U/L 55  51    Total Bilirubin 0.0 - 1.2 mg/dL 0.2  0.5  0.3   Bilirubin, Direct 0.0 - 0.2 mg/dL 0.1         Current Medications:   Current Outpatient Medications (Endocrine & Metabolic):    predniSONE  (DELTASONE ) 1 MG tablet, Take 2 mg by mouth daily with breakfast.   testosterone  cypionate (DEPOTESTOSTERONE CYPIONATE) 200 MG/ML injection, Inject 200 mg into the muscle every 30 (thirty) days.  Current Outpatient Medications (Cardiovascular):    furosemide  (LASIX ) 40 MG tablet, Take 40 mg by mouth daily as needed for fluid.    metoprolol  succinate (TOPROL -XL) 25 MG 24 hr tablet, TAKE 1 TABLET(25 MG) BY MOUTH DAILY   nitroGLYCERIN  (NITROSTAT ) 0.4 MG SL tablet, Place 1 tablet (0.4 mg total) under the tongue every 5 (five) minutes as needed for chest pain.   rosuvastatin  (CRESTOR ) 20 MG tablet, TAKE 1 TABLET(20 MG) BY MOUTH DAILY  Current Outpatient Medications (Respiratory):    Budeson-Glycopyrrol-Formoterol  (BREZTRI  AEROSPHERE) 160-9-4.8 MCG/ACT AERO, Inhale 2 puffs into the lungs in the morning and at bedtime. (Patient taking differently: Inhale 2 puffs into the lungs as needed (wheezing/SOB).)   fluticasone  (FLONASE ) 50 MCG/ACT nasal spray, Place 2 sprays into both nostrils daily.   loratadine  (CLARITIN ) 10 MG tablet, Take 10 mg by mouth daily.  Current Outpatient Medications (Analgesics):    allopurinol  (ZYLOPRIM ) 100 MG tablet, TAKE 1 TABLET BY MOUTH ONCE DAILY   ORENCIA CLICKJECT 125 MG/ML SOAJ, Inject 125 mg into the skin once a week.   traMADol  (ULTRAM ) 50 MG tablet, Take 50-100 mg by mouth 2 (two) times daily as needed.  Current Outpatient Medications (Hematological):     clopidogrel  (PLAVIX ) 75 MG tablet, TAKE 1 TABLET(75 MG) BY MOUTH DAILY   cyanocobalamin  (,VITAMIN B-12,) 1000 MCG/ML injection, Inject 1,000 mcg into the muscle every 30 (thirty) days.   folic acid  (FOLVITE ) 1 MG tablet, Take 1 mg by mouth daily.  Current Outpatient Medications (Other):  Alpha-Lipoic Acid 300 MG TABS, Take 1 tablet (300 mg total) by mouth 2 (two) times daily.   ALPRAZolam (XANAX) 0.25 MG tablet, Take 1 tablet (0.25 mg total) by mouth at bedtime as needed for anxiety or sleep (in the sleep lab for pre med, take one tab po  at bedtime).   Cholecalciferol  (VITAMIN D3) 50 MCG (2000 UT) TABS, Take 2,000 Units by mouth daily.   estradiol (ESTRACE) 0.1 MG/GM vaginal cream, Place 1 Applicatorful vaginally 2 (two) times a week.   hydroxychloroquine  (PLAQUENIL ) 200 MG tablet, Take 200 mg by mouth daily.   hyoscyamine (LEVSIN SL) 0.125 MG SL tablet, Place 1 tablet (0.125 mg total) under the tongue every 6 (six) hours as needed for cramping (esophageal spasm).   ketoconazole  (NIZORAL ) 2 % shampoo, Apply 1 Application topically as needed.   lidocaine  (LIDODERM ) 5 %, Place 1 patch onto the skin daily. Remove & Discard patch within 12 hours or as directed by MD (Patient taking differently: Place 1 patch onto the skin daily as needed. Remove & Discard patch within 12 hours or as directed by MD)   Multiple Vitamin (MULTI-VITAMIN) tablet, Take 1 tablet by mouth daily.   MYRBETRIQ 50 MG TB24 tablet, Take 1 tablet by mouth daily.   pantoprazole  (PROTONIX ) 40 MG tablet, Take 1 tablet (40 mg total) by mouth 2 (two) times daily.   polyethylene glycol (MIRALAX  / GLYCOLAX ) 17 g packet, Take 17 g by mouth daily as needed for mild constipation.   Propylene Glycol (SYSTANE COMPLETE) 0.6 % SOLN, Place 2 drops into both eyes 2 (two) times daily.   sertraline  (ZOLOFT ) 100 MG tablet, Take 0.5 tablets (50 mg total) by mouth daily.   traZODone  (DESYREL ) 50 MG tablet, Take 25 mg by mouth at bedtime as  needed.  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   Colon polyps    2 polyps by report   Coronary artery disease involving native coronary artery of native heart without angina pectoris 01/01/2014   NSTEMI in January 2019 in [place] >> s/p DES x2 to the LAD (procedure complicated by stent thrombosis) Myoview  05/2020: EF 72, no infarct or ischemia; low risk Echocardiogram 05/2019: EF 60-65, GLS -21.5 Cardiac catheterization at [facility] in 08/2017: Normal left main, normal LCx, normal RCA; LAD/diagonal 95%>> PCI    Diverticulosis    GERD (gastroesophageal reflux disease)    occasionally takes Nexium     Gout    History of bladder infections    many yrs ago   History of bronchitis    last time at least 20yrs ago   History of hiatal hernia    History of stress incontinence    Hyperlipidemia    takes Pravastatin  daily   Hypertension    Insomnia    takes Ambien nightly prn   NSTEMI (non-ST elevated myocardial infarction) (HCC) 08/2017   OSA (obstructive sleep apnea)    on CPAP   Osteoarthritis    Peripheral edema    takes Furosemide  daily prn   Pneumonia    PONV (postoperative nausea and vomiting)    Postmenopausal hormone therapy    Radiation 07/27/13-09/07/13   Right Breast x 31 treatments   Rhinitis    uses Flonase  prn   Sinus congestion    Status post breast reconstruction  10/15/2014   Bilateral implant removal and DIEP performed in [place], [place]   Tachycardia    takes Metoprolol  daily   Ventricular tachycardia, non-sustained (HCC)    during sleep study 2009 with normal cardiac workup   Allergies:  Allergies  Allergen Reactions   Iodine  Hives and Swelling    Other Reaction(s): Unknown   Pseudoeph-Hydrocodone -Gg Other (See Comments)    hallucinations   Benadryl  [Diphenhydramine ] Other (See  Comments)    hallucinations   Codeine     Other Reaction(s): Not available   Cyclobenzaprine      Other Reaction(s): Not available   Hydrocodone      Other Reaction(s): Not available   Ivp Dye [Iodinated Contrast Media] Hives   Methocarbamol  Other (See Comments)    NO MUSCLE RELAXER'S  Hallucinations   Oxycodone  Other (See Comments)    hallucinations   Shellfish Protein-Containing Drug Products     Other Reaction(s): Unknown   Sulfa Antibiotics Swelling   Tizanidine     Other Reaction(s): Not available   Tape Other (See Comments) and Dermatitis    tears skin ; ok to use paper tape  Adhesive agent (substance)     Surgical History:  She  has a past surgical history that includes Appendectomy; Cholecystectomy; Cosmetic surgery; Tonsillectomy; Abdominal hysterectomy; Cardiac catheterization (1999); Total mastectomy (Bilateral, 05/23/2013); Axillary sentinel node biopsy (Right, 05/23/2013); Breast reconstruction with placement of tissue expander and flex hd (acellular hydrated dermis) (Bilateral, 05/23/2013); Removal of bilateral tissue expanders with placement of bilateral breast implants (Bilateral, 05/17/2014); Percutaneous coronary stent intervention (pci-s) (08/2017); Total knee arthroplasty (Right, 07/26/2018); OTHER SURGICAL HISTORY (09/2017); Cataract extraction, bilateral (Bilateral); Total knee arthroplasty (Left, 12/02/2021); Artery Biopsy (Left, 05/15/2022); Reverse shoulder arthroplasty (Right, 05/13/2023); Exploratory laparotomy; Esophagogastroduodenoscopy (N/A, 02/08/2024); Balloon dilation (N/A, 02/08/2024); and Esophagogastroduodenoscopy (N/A, 05/17/2024). Family History:  Her family history includes ALS in her sister; Breast cancer in her maternal aunt, mother, and another family member; Breast cancer (age of onset: 72) in her maternal grandmother; Heart attack in her father, maternal grandfather, and paternal grandfather; Heart disease in her father; Other in her paternal  grandmother; Thyroid  disease in her daughter.  REVIEW OF SYSTEMS  : All other systems reviewed and negative except where noted in the History of Present Illness.  PHYSICAL EXAM: BP 110/60   Pulse 62   Ht 5' 2 (1.575 m)   Wt 147 lb (66.7 kg)   LMP 08/17/1977 (Approximate)   BMI 26.89 kg/m  Physical Exam   GENERAL APPEARANCE: Well nourished, in no apparent distress. HEENT: No cervical lymphadenopathy, unremarkable thyroid , sclerae anicteric, conjunctiva pink. RESPIRATORY: Respiratory effort normal, breath sounds equal bilaterally without rales, rhonchi, or wheezing. CARDIO: Regular rate and rhythm with no murmurs, rubs, or gallops, peripheral pulses intact. ABDOMEN: Soft, non-distended, active bowel sounds in all four quadrants, slightly tender to palpation, no rebound, no mass appreciated. RECTAL: Declines. MUSCULOSKELETAL: Full range of motion, normal gait, without edema. SKIN: Dry, intact without rashes or lesions. No jaundice. NEURO: Alert, oriented, no focal deficits. PSYCH: Cooperative, normal mood and affect.      Alan JONELLE Coombs, PA-C 10:43 AM

## 2024-06-27 NOTE — Telephone Encounter (Signed)
 Discussed MD scheduling recommendations with patient. She's concerned she cannot wait that long d/t ongoing difficulty with swallowing. She is still able to swallow food okay, but water  will come back up at times. Taking pantoprazole  40 mg BID. Rescheduled her for sooner OV with Alan, [place] tomorrow at 8:40 am.

## 2024-06-28 ENCOUNTER — Telehealth: Payer: Self-pay

## 2024-06-28 ENCOUNTER — Encounter: Payer: Self-pay | Admitting: Physician Assistant

## 2024-06-28 ENCOUNTER — Ambulatory Visit: Admitting: Physician Assistant

## 2024-06-28 ENCOUNTER — Ambulatory Visit: Payer: Self-pay | Admitting: Neurology

## 2024-06-28 ENCOUNTER — Other Ambulatory Visit (INDEPENDENT_AMBULATORY_CARE_PROVIDER_SITE_OTHER)

## 2024-06-28 ENCOUNTER — Ambulatory Visit: Payer: Self-pay | Admitting: Physician Assistant

## 2024-06-28 VITALS — BP 110/60 | HR 62 | Ht 62.0 in | Wt 147.0 lb

## 2024-06-28 DIAGNOSIS — K449 Diaphragmatic hernia without obstruction or gangrene: Secondary | ICD-10-CM | POA: Diagnosis not present

## 2024-06-28 DIAGNOSIS — K219 Gastro-esophageal reflux disease without esophagitis: Secondary | ICD-10-CM

## 2024-06-28 DIAGNOSIS — K5909 Other constipation: Secondary | ICD-10-CM | POA: Diagnosis not present

## 2024-06-28 DIAGNOSIS — R1319 Other dysphagia: Secondary | ICD-10-CM

## 2024-06-28 DIAGNOSIS — D649 Anemia, unspecified: Secondary | ICD-10-CM

## 2024-06-28 DIAGNOSIS — I25119 Atherosclerotic heart disease of native coronary artery with unspecified angina pectoris: Secondary | ICD-10-CM | POA: Diagnosis not present

## 2024-06-28 DIAGNOSIS — N1832 Chronic kidney disease, stage 3b: Secondary | ICD-10-CM | POA: Diagnosis not present

## 2024-06-28 LAB — COMPREHENSIVE METABOLIC PANEL WITH GFR
ALT: 25 U/L (ref 0–35)
AST: 29 U/L (ref 0–37)
Albumin: 4.4 g/dL (ref 3.5–5.2)
Alkaline Phosphatase: 69 U/L (ref 39–117)
BUN: 27 mg/dL — ABNORMAL HIGH (ref 6–23)
CO2: 25 meq/L (ref 19–32)
Calcium: 9.2 mg/dL (ref 8.4–10.5)
Chloride: 107 meq/L (ref 96–112)
Creatinine, Ser: 1.11 mg/dL (ref 0.40–1.20)
GFR: 47.23 mL/min — ABNORMAL LOW (ref >60.00)
Glucose, Bld: 84 mg/dL (ref 70–99)
Potassium: 4.2 meq/L (ref 3.5–5.1)
Sodium: 141 meq/L (ref 135–145)
Total Bilirubin: 0.4 mg/dL (ref 0.2–1.2)
Total Protein: 6.7 g/dL (ref 6.0–8.3)

## 2024-06-28 LAB — CBC WITH DIFFERENTIAL/PLATELET
Basophils Absolute: 0.1 K/uL (ref 0.0–0.1)
Basophils Relative: 0.9 % (ref 0.0–3.0)
Eosinophils Absolute: 0.1 K/uL (ref 0.0–0.7)
Eosinophils Relative: 2.1 % (ref 0.0–5.0)
HCT: 38.2 % (ref 36.0–46.0)
Hemoglobin: 12.4 g/dL (ref 12.0–15.0)
Lymphocytes Relative: 23.9 % (ref 12.0–46.0)
Lymphs Abs: 1.5 K/uL (ref 0.7–4.0)
MCHC: 32.5 g/dL (ref 30.0–36.0)
MCV: 87.9 fl (ref 78.0–100.0)
Monocytes Absolute: 0.6 K/uL (ref 0.1–1.0)
Monocytes Relative: 9.2 % (ref 3.0–12.0)
Neutro Abs: 3.9 K/uL (ref 1.4–7.7)
Neutrophils Relative %: 63.9 % (ref 43.0–77.0)
Platelets: 189 K/uL (ref 150.0–400.0)
RBC: 4.35 Mil/uL (ref 3.87–5.11)
RDW: 21.6 % — ABNORMAL HIGH (ref 11.5–15.5)
WBC: 6.2 K/uL (ref 4.0–10.5)

## 2024-06-28 MED ORDER — HYOSCYAMINE SULFATE 0.125 MG SL SUBL: 0.1250 mg | SUBLINGUAL_TABLET | Freq: Four times a day (QID) | SUBLINGUAL | 1 refills | Status: AC | PRN

## 2024-06-28 NOTE — Telephone Encounter (Signed)
   Name: Tracey [name]  DOB: Jan 05, 1946  MRN: [id number]  Primary Cardiologist: Ozell Fell, MD  Chart reviewed as part of pre-operative protocol coverage. Because of Tracey [name]'s past medical history and time since last visit, she will require a follow-up in-office visit in order to better assess preoperative cardiovascular risk.  Patient has an office visit scheduled on 07/05/2024 with Katlyn West, NP. Appointment notes have been updated to reflect need for pre-op evaluation.   Pre-op covering staff:  - Please contact requesting surgeon's office via preferred method (i.e, phone, fax) to inform them of need for appointment prior to surgery.   Barnie Hila, NP  06/28/2024, 10:54 AM

## 2024-06-28 NOTE — Telephone Encounter (Signed)
 [facility] Pre-operative Risk Assessment     Request for surgical clearance:     Endoscopy Procedure  What type of surgery is being performed?     Endoscopy  When is this surgery scheduled?     07/20/24  What type of clearance is required ?   Pharmacy  Are there any medications that need to be held prior to surgery and how long? Plavix  for 5 days  Practice name and name of physician performing surgery?      [facility]  What is your office phone and fax number?      Phone- [phone]  Fax- [phone]  Anesthesia type (None, local, MAC, general) ?       MAC   Please route your response to [place] Trudi Goodnight, CMA

## 2024-06-28 NOTE — Telephone Encounter (Signed)
 Will update all parties involved pt has appt 07/05/24 with Katlyn West, NP. Appt notes reflect need preop clearance.

## 2024-06-28 NOTE — Patient Instructions (Addendum)
 [facility] Main: [phone]  Your provider has requested that you go to the basement level for lab work before leaving today. Press B on the elevator. The lab is located at the first door on the left as you exit the elevator.  Due to recent changes in healthcare laws, you may see the results of your imaging and laboratory studies on MyChart before your provider has had a chance to review them.  We understand that in some cases there may be results that are confusing or concerning to you. Not all laboratory results come back in the same time frame and the provider may be waiting for multiple results in order to interpret others.  Please give us  48 hours in order for your provider to thoroughly review all the results before contacting the office for clarification of your results.   You have been scheduled for an endoscopy. Please follow written instructions given to you at your visit today.  If you use inhalers (even only as needed), please bring them with you on the day of your procedure.  If you take any of the following medications, they will need to be adjusted prior to your procedure:   DO NOT TAKE 7 DAYS PRIOR TO TEST- Trulicity (dulaglutide) Ozempic, Wegovy (semaglutide) Mounjaro, Zepbound (tirzepatide) Bydureon Bcise (exanatide extended release)  DO NOT TAKE 1 DAY PRIOR TO YOUR TEST Rybelsus (semaglutide) Adlyxin (lixisenatide) Victoza (liraglutide) Byetta (exanatide) ___________________________________________________________________________   Thank you for trusting me with your gastrointestinal care!   Alan Coombs, PA-C   _______________________________________________________  If your blood pressure at your visit was 140/90 or greater, please contact your primary care physician to follow up on this.  _______________________________________________________  If you are age 15 or older, your body mass index should be between 23-30. Your Body mass index is 26.89  kg/m. If this is out of the aforementioned range listed, please consider follow up with your Primary Care Provider.  If you are age 15 or younger, your body mass index should be between 19-25. Your Body mass index is 26.89 kg/m. If this is out of the aformentioned range listed, please consider follow up with your Primary Care Provider.   ________________________________________________________  The [facility] providers would like to encourage you to use MYCHART to communicate with providers for non-urgent requests or questions.  Due to long hold times on the telephone, sending your provider a message by [facility] may be a faster and more efficient way to get a response.  Please allow 48 business hours for a response.  Please remember that this is for non-urgent requests.  _______________________________________________________  [facility] is using a team-based approach to care.  Your team is made up of your doctor and two to three APPS. Our APPS (Nurse Practitioners and Physician Assistants) work with your physician to ensure care continuity for you. They are fully qualified to address your health concerns and develop a treatment plan. They communicate directly with your gastroenterologist to care for you. Seeing the Advanced Practice Practitioners on your physician's team can help you by facilitating care more promptly, often allowing for earlier appointments, access to diagnostic testing, procedures, and other specialty referrals.

## 2024-06-29 NOTE — Telephone Encounter (Signed)
 I called , spoke to pt.  Relayed sleep study results.  New cpap machine with face mask and oxygen  2L needed when in [place] .  Pt has O2 in [place]  already, but needs new cpap and mask.  I relayed that will send high priority message since she is needing prior to 07-21-2024.  She has already been in touch with them at [facility].  She appreciated results, verbalized understanding of plan.

## 2024-06-29 NOTE — Telephone Encounter (Signed)
-----   Message from Farmington Dohmeier sent at 06/28/2024  4:19 PM EST ----- Central sleep apnea was again documented, the patient responded well to CPAP treatment and tolerated a FFM F 40 in small size, no oxygen  was needed.  The patient experienced  few apneas or hypopneas  at the final pressure of 10 cm water .    2. Total sleep time was within normal limits.  The quality of  sleep improved with positive airway pressure.  no prolonged hypoxia was found, and no oxygen  was needed during this titration at local  altitude.        RECOMMENDATIONS: CPAP treatment will continue at a goal of 10 cm water , 3 cm EPR,  Well  tolerated was a FFM F 40 in small size, no oxygen  was needed.  The patient experienced  few apneas or  hypopneas at the final pressure of 10 cm water .  This does not mean that the patient can expect the same results while staying at high altitude- she should use 2 L 02 /minute through her CPAP at night during her stay in the [facility].  ----- Message ----- From: Interface, Scanned Link In One Three One Sent: 06/26/2024   6:42 PM EST To: Dedra Gores, MD

## 2024-06-29 NOTE — Telephone Encounter (Signed)
 RE: new cpap machine and mask,  time sensitive (needs by 07-21-2024) Received: Today New, Adine Neysa Nena GORMAN, RN; New, Bradley; Cain, Mitchell; Ziegler, Melissa; Tucker, Dolanda received     Previous Messages    ----- Message ----- From: Neysa Nena GORMAN, RN Sent: 06/29/2024  11:56 AM EST To: Adine Randolm Ephraim Viktoria; Melissa Ziegler* Subject: new cpap machine and mask,  time sensitive (*  New order in epic for this pt. She has been speaking to someone there already about new machine.  Tracey [name] Female, 78 y.o., 1946-04-03 Pronouns: she/her/hers MRN: [id number] Phone: [phone]   It is time sensitive as she will need by 07/21/2024  Thur[place]

## 2024-07-03 NOTE — Progress Notes (Unsigned)
 Cardiology Office Note    Date:  07/05/2024  ID:  Tracey [name], Tracey [name] [birth year], MRN [id number] PCP:  Larnell Hamilton, MD  Cardiologist:  Ozell Fell, MD  Electrophysiologist:  None   Chief Complaint: Preoperative cardiac evaluation   History of Present Illness: .   Tracey [name] is a 78 y.o. female with visit-pertinent history of CAD s/p NSTEMI in 08/2017 with DES to LAD complicated by stent thrombosis who required a second stent, hyperlipidemia, hypertension, CKD, sleep apnea, aortic atherosclerosis, breast cancer s/p double mastectomy and radiation.  In January 2019 patient had NSTEMI while in [place], [place] .  She had DES to LAD which was complicated by stent thrombosis requiring a second stent.  She has a history of intolerance to high-dose beta-blockers due to fatigue.  She was last seen in clinic by Dr. Fell on 04/22/2023 for preoperative cardiac evaluation for right shoulder replacement.  Patient had remained stable from a cardiac standpoint, she was cleared for surgery.  On chart review patient was admitted on 05/16/2024 with GI bleed and chest discomfort.  Patient underwent upper GI endoscopy revealing a nonbleeding ulcer, Cameron's erosion, erythematous mucosa, clips were placed.  Patient was restarted on Plavix , had improvement in hemoglobin no recurrent chest pain, hesitancy troponins were negative.  Patient was discharged on 05/18/2024.  Today she presents for preoperative cardiac evaluation and follow-up.  Patient reports that from a cardiac standpoint she has been doing well overall, denies any chest pain or increased shortness of breath.  She denies any increased lower extremity edema, orthopnea or PND, denies any palpitations, presyncope or syncope.  Patient notes that she has multiple other medical problems such as arthritis and ongoing back problems that have been limiting her.  She regularly walks with a cane, despite her ongoing medical condition she is able to achieve 4  METS of activity.  She reports that she was out working in her yard earlier this week.  Labwork independently reviewed: 06/28/2024: Sodium 141, potassium 4.2, creatinine 1.11, AST 29, ALT 25, hemoglobin 12.4, hematocrit 38.2 ROS: .   Today she denies chest pain, shortness of breath, lower extremity edema, palpitations, melena, hematuria, hemoptysis, diaphoresis, weakness, presyncope, syncope, orthopnea, and PND.  All other systems are reviewed and otherwise negative. Studies Reviewed: SABRA   EKG:  EKG is ordered today, personally reviewed, demonstrating  EKG Interpretation Date/Time:  Wednesday July 05 2024 15:54:13 EST Ventricular Rate:  66 PR Interval:  136 QRS Duration:  82 QT Interval:  408 QTC Calculation: 427 R Axis:   3  Text Interpretation: Normal sinus rhythm Normal ECG Confirmed by Dutch Ing [id number]) on 07/05/2024 3:57:12 PM   CV Studies: Cardiac studies reviewed are outlined and summarized above. Otherwise please see EMR for full report. Cardiac Studies & Procedures   ______________________________________________________________________________________________ CARDIAC CATHETERIZATION  CARDIAC CATHETERIZATION 09/15/2017   STRESS TESTS  MYOCARDIAL PERFUSION IMAGING 06/30/2022  Interpretation Summary   The study is normal. The study is low risk.   No ST deviation was noted.   Left ventricular function is normal. Nuclear stress EF: 69 %. The left ventricular ejection fraction is hyperdynamic (>65%). End diastolic cavity size is normal.   ECHOCARDIOGRAM  ECHOCARDIOGRAM COMPLETE 05/20/2023  Narrative ECHOCARDIOGRAM REPORT    Patient Name:   Tracey [name] Date of Exam: 05/20/2023 Medical Rec #:  [id number]      Height:       62.0 in Accession #:    [id number]     Weight:  148.0 lb Date of Birth:  [birth year]      BSA:          1.682 m Patient Age:    73 years       BP:           142/64 mmHg Patient Gender: F              HR:           63 bpm. Exam Location:   Inpatient  Procedure: 2D Echo, Cardiac Doppler and Color Doppler  Indications:    Dyspnea R06.00  History:        Patient has prior history of Echocardiogram examinations, most recent 06/30/2022.  Sonographer:    Tinnie Gosling RDCS Referring Phys: TAUNA ELGIE BUTTER  IMPRESSIONS   1. Left ventricular ejection fraction, by estimation, is 55 to 60%. The left ventricle has normal function. The left ventricle has no regional wall motion abnormalities. Left ventricular diastolic parameters were normal. 2. Right ventricular systolic function is normal. The right ventricular size is normal. 3. The mitral valve is normal in structure. Trivial mitral valve regurgitation. No evidence of mitral stenosis. 4. The aortic valve is normal in structure. Aortic valve regurgitation is not visualized. No aortic stenosis is present. 5. The inferior vena cava is normal in size with greater than 50% respiratory variability, suggesting right atrial pressure of 3 mmHg.  FINDINGS Left Ventricle: Left ventricular ejection fraction, by estimation, is 55 to 60%. The left ventricle has normal function. The left ventricle has no regional wall motion abnormalities. The left ventricular internal cavity size was normal in size. There is no left ventricular hypertrophy. Left ventricular diastolic parameters were normal.  Right Ventricle: The right ventricular size is normal. No increase in right ventricular wall thickness. Right ventricular systolic function is normal.  Left Atrium: Left atrial size was normal in size.  Right Atrium: Right atrial size was normal in size.  Pericardium: There is no evidence of pericardial effusion.  Mitral Valve: The mitral valve is normal in structure. Trivial mitral valve regurgitation. No evidence of mitral valve stenosis.  Tricuspid Valve: The tricuspid valve is normal in structure. Tricuspid valve regurgitation is trivial. No evidence of tricuspid stenosis.  Aortic Valve: The  aortic valve is normal in structure. Aortic valve regurgitation is not visualized. No aortic stenosis is present.  Pulmonic Valve: The pulmonic valve was not well visualized. Pulmonic valve regurgitation is not visualized. No evidence of pulmonic stenosis.  Aorta: The aortic root is normal in size and structure.  Venous: The inferior vena cava is normal in size with greater than 50% respiratory variability, suggesting right atrial pressure of 3 mmHg.  IAS/Shunts: No atrial level shunt detected by color flow Doppler.   LEFT VENTRICLE PLAX 2D LVIDd:         4.50 cm   Diastology LVIDs:         3.60 cm   LV e' medial:    6.85 cm/s LV PW:         0.90 cm   LV E/e' medial:  16.6 LV IVS:        0.80 cm   LV e' lateral:   10.30 cm/s LVOT diam:     1.70 cm   LV E/e' lateral: 11.1 LV SV:         48 LV SV Index:   29 LVOT Area:     2.27 cm   RIGHT VENTRICLE  IVC RV S prime:     9.68 cm/s  IVC diam: 2.00 cm TAPSE (M-mode): 1.6 cm  LEFT ATRIUM             Index        RIGHT ATRIUM           Index LA diam:        3.00 cm 1.78 cm/m   RA Area:     10.90 cm LA Vol (A2C):   38.4 ml 22.83 ml/m  RA Volume:   22.70 ml  13.50 ml/m LA Vol (A4C):   41.0 ml 24.37 ml/m LA Biplane Vol: 39.6 ml 23.54 ml/m AORTIC VALVE LVOT Vmax:   84.20 cm/s LVOT Vmean:  60.500 cm/s LVOT VTI:    0.213 m  AORTA Ao Root diam: 2.90 cm Ao Asc diam:  2.60 cm  MITRAL VALVE MV Area (PHT): 4.08 cm     SHUNTS MV Decel Time: 186 msec     Systemic VTI:  0.21 m MV E velocity: 114.00 cm/s  Systemic Diam: 1.70 cm MV A velocity: 120.00 cm/s MV E/A ratio:  0.95  Toribio Fuel MD Electronically signed by Toribio Fuel MD Signature Date/Time: 05/20/2023/5:02:30 PM    Final          ______________________________________________________________________________________________       Current Reported Medications:.    Current Meds  Medication Sig   allopurinol  (ZYLOPRIM ) 100 MG tablet TAKE 1  TABLET BY MOUTH ONCE DAILY   ALPRAZolam (XANAX) 0.25 MG tablet Take 1 tablet (0.25 mg total) by mouth at bedtime as needed for anxiety or sleep (in the sleep lab for pre med, take one tab po  at bedtime).   Budeson-Glycopyrrol-Formoterol  (BREZTRI  AEROSPHERE) 160-9-4.8 MCG/ACT AERO Inhale 2 puffs into the lungs in the morning and at bedtime. (Patient taking differently: Inhale 2 puffs into the lungs as needed (wheezing/SOB).)   Cholecalciferol  (VITAMIN D3) 50 MCG (2000 UT) TABS Take 2,000 Units by mouth daily.   clopidogrel  (PLAVIX ) 75 MG tablet TAKE 1 TABLET(75 MG) BY MOUTH DAILY   cyanocobalamin  (,VITAMIN B-12,) 1000 MCG/ML injection Inject 1,000 mcg into the muscle every 30 (thirty) days.   estradiol (ESTRACE) 0.1 MG/GM vaginal cream Place 1 Applicatorful vaginally 2 (two) times a week.   fluticasone  (FLONASE ) 50 MCG/ACT nasal spray Place 2 sprays into both nostrils daily.   folic acid  (FOLVITE ) 1 MG tablet Take 1 mg by mouth daily.   furosemide  (LASIX ) 40 MG tablet Take 40 mg by mouth daily as needed for fluid.    hydroxychloroquine  (PLAQUENIL ) 200 MG tablet Take 200 mg by mouth daily.   hyoscyamine (LEVSIN SL) 0.125 MG SL tablet Place 1 tablet (0.125 mg total) under the tongue every 6 (six) hours as needed for cramping (esophageal spasm).   ketoconazole  (NIZORAL ) 2 % shampoo Apply 1 Application topically as needed.   lidocaine  (LIDODERM ) 5 % Place 1 patch onto the skin daily. Remove & Discard patch within 12 hours or as directed by MD (Patient taking differently: Place 1 patch onto the skin daily as needed. Remove & Discard patch within 12 hours or as directed by MD)   loratadine  (CLARITIN ) 10 MG tablet Take 10 mg by mouth daily.   metoprolol  succinate (TOPROL -XL) 25 MG 24 hr tablet TAKE 1 TABLET(25 MG) BY MOUTH DAILY   Multiple Vitamin (MULTI-VITAMIN) tablet Take 1 tablet by mouth daily.   MYRBETRIQ 50 MG TB24 tablet Take 1 tablet by mouth daily.   nitroGLYCERIN  (NITROSTAT ) 0.4 MG SL tablet  Place 1 tablet (0.4 mg total) under the tongue every 5 (five) minutes as needed for chest pain.   ORENCIA CLICKJECT 125 MG/ML SOAJ Inject 125 mg into the skin once a week.   pantoprazole  (PROTONIX ) 40 MG tablet Take 1 tablet (40 mg total) by mouth 2 (two) times daily.   polyethylene glycol (MIRALAX  / GLYCOLAX ) 17 g packet Take 17 g by mouth daily as needed for mild constipation.   predniSONE  (DELTASONE ) 1 MG tablet Take 2 mg by mouth daily with breakfast.   Propylene Glycol (SYSTANE COMPLETE) 0.6 % SOLN Place 2 drops into both eyes 2 (two) times daily.   rosuvastatin  (CRESTOR ) 20 MG tablet TAKE 1 TABLET(20 MG) BY MOUTH DAILY   sertraline  (ZOLOFT ) 100 MG tablet Take 0.5 tablets (50 mg total) by mouth daily.   testosterone  cypionate (DEPOTESTOSTERONE CYPIONATE) 200 MG/ML injection Inject 200 mg into the muscle every 30 (thirty) days.   traMADol  (ULTRAM ) 50 MG tablet Take 50-100 mg by mouth 2 (two) times daily as needed.   traZODone  (DESYREL ) 50 MG tablet Take 25 mg by mouth at bedtime as needed.   Physical Exam:    VS:  BP 130/68   Pulse 66   Ht 5' 2 (1.575 m)   Wt 146 lb 6.4 oz (66.4 kg)   LMP 08/17/1977 (Approximate)   SpO2 99%   BMI 26.78 kg/m    Wt Readings from Last 3 Encounters:  07/05/24 146 lb 6.4 oz (66.4 kg)  06/28/24 147 lb (66.7 kg)  06/06/24 149 lb (67.6 kg)    GEN: Well nourished, well developed in no acute distress NECK: No JVD; No carotid bruits CARDIAC: RRR, no murmurs, rubs, gallops RESPIRATORY:  Clear to auscultation without rales, wheezing or rhonchi  ABDOMEN: Soft, non-tender, non-distended EXTREMITIES:  No edema; No acute deformity     Asessement and Plan:.    Preoperative cardiac evaluation: Patient is currently pending endoscopy on 07/20/2024 with [facility] gastroenterology. Ms. Doke perioperative risk of a major cardiac event is 0.9% according to the Revised Cardiac Risk Index (RCRI).  Therefore, she is at low risk for perioperative complications.   Her  functional capacity is good at  6.61 METs according to the Duke Activity Status Index (DASI). Recommendations: According to ACC/AHA guidelines, no further cardiovascular testing needed.  The patient may proceed to surgery at acceptable risk.  Antiplatelet and/or Anticoagulation Recommendations: Clopidogrel  (Plavix ) can be held for 5 days prior to her surgery and resumed as soon as possible post op.  CAD: Patient with history of NSTEMI s/p DES to LAD complicated by stent thrombosis requiring a second stent. Stable with no anginal symptoms. No indication for ischemic evaluation.  Heart healthy diet and regular cardiovascular exercise encouraged.  Reviewed ED precautions. Continue Plavix  75 mg daily, Lasix  40 mg daily as needed, metoprolol  succinate 25 mg daily and Crestor  20 mg daily.  HTN: Blood pressure today 130/68.  Continue current antihypertensive regimen.  HLD: Last lipid profile unable to review, will request from PCPs office.  Continue Crestor  20 mg daily.  CKD: Last creatinine 1.11 on 06/28/2024.  Monitored and managed per PCP.   Disposition: F/u with Dr. Wonda in six months or sooner if needed.   Signed, Camryn Lampson D Ardie Dragoo, NP

## 2024-07-04 ENCOUNTER — Other Ambulatory Visit: Payer: Self-pay | Admitting: Cardiovascular Disease

## 2024-07-04 DIAGNOSIS — E785 Hyperlipidemia, unspecified: Secondary | ICD-10-CM

## 2024-07-05 ENCOUNTER — Encounter: Payer: Self-pay | Admitting: Cardiology

## 2024-07-05 ENCOUNTER — Ambulatory Visit: Attending: Cardiology | Admitting: Cardiology

## 2024-07-05 VITALS — BP 130/68 | HR 66 | Ht 62.0 in | Wt 146.4 lb

## 2024-07-05 DIAGNOSIS — Z01810 Encounter for preprocedural cardiovascular examination: Secondary | ICD-10-CM | POA: Diagnosis not present

## 2024-07-05 DIAGNOSIS — E785 Hyperlipidemia, unspecified: Secondary | ICD-10-CM | POA: Diagnosis not present

## 2024-07-05 DIAGNOSIS — I2510 Atherosclerotic heart disease of native coronary artery without angina pectoris: Secondary | ICD-10-CM | POA: Insufficient documentation

## 2024-07-05 DIAGNOSIS — I10 Essential (primary) hypertension: Secondary | ICD-10-CM | POA: Insufficient documentation

## 2024-07-05 NOTE — Patient Instructions (Signed)
 Medication Instructions:  Your physician recommends that you continue on your current medications as directed. Please refer to the Current Medication list given to you today.  *If you need a refill on your cardiac medications before your next appointment, please call your pharmacy*  Lab Work: NONE  If you have labs (blood work) drawn today and your tests are completely normal, you will receive your results only by: MyChart Message (if you have MyChart) OR A paper copy in the mail If you have any lab test that is abnormal or we need to change your treatment, we will call you to review the results.  Testing/Procedures: NONE  Follow-Up: At [facility], you and your health needs are our priority.  As part of our continuing mission to provide you with exceptional heart care, our providers are all part of one team.  This team includes your primary Cardiologist (physician) and Advanced Practice Providers or APPs (Physician Assistants and Nurse Practitioners) who all work together to provide you with the care you need, when you need it.  Your next appointment:   6 month(s)  Provider:   Ozell Fell, MD

## 2024-07-06 ENCOUNTER — Ambulatory Visit: Admitting: Gastroenterology

## 2024-07-07 NOTE — Telephone Encounter (Signed)
 Lmtcb. (Re: cardiologist approval of 5 day hold on Plavix ).

## 2024-07-10 NOTE — Telephone Encounter (Signed)
 Inbound call from patient requesting to speak to  [facility]. Patient is requesting a call back. Please advise.

## 2024-07-10 NOTE — Telephone Encounter (Signed)
 Lmtcb

## 2024-07-10 NOTE — Telephone Encounter (Signed)
 I spoke to Tracey [name] and I advised her that I was calling to let her know that we received the clearance to hold her Plavix  for 5 days.

## 2024-07-11 DIAGNOSIS — M5416 Radiculopathy, lumbar region: Secondary | ICD-10-CM | POA: Diagnosis not present

## 2024-07-17 DIAGNOSIS — M353 Polymyalgia rheumatica: Secondary | ICD-10-CM | POA: Diagnosis not present

## 2024-07-17 DIAGNOSIS — M7918 Myalgia, other site: Secondary | ICD-10-CM | POA: Diagnosis not present

## 2024-07-17 DIAGNOSIS — M2560 Stiffness of unspecified joint, not elsewhere classified: Secondary | ICD-10-CM | POA: Diagnosis not present

## 2024-07-17 DIAGNOSIS — M0609 Rheumatoid arthritis without rheumatoid factor, multiple sites: Secondary | ICD-10-CM | POA: Diagnosis not present

## 2024-07-17 DIAGNOSIS — E663 Overweight: Secondary | ICD-10-CM | POA: Diagnosis not present

## 2024-07-17 DIAGNOSIS — Z6826 Body mass index (BMI) 26.0-26.9, adult: Secondary | ICD-10-CM | POA: Diagnosis not present

## 2024-07-19 DIAGNOSIS — N3281 Overactive bladder: Secondary | ICD-10-CM | POA: Diagnosis not present

## 2024-07-19 DIAGNOSIS — N3020 Other chronic cystitis without hematuria: Secondary | ICD-10-CM | POA: Diagnosis not present

## 2024-07-20 ENCOUNTER — Encounter: Admitting: Pediatrics

## 2024-07-20 ENCOUNTER — Ambulatory Visit: Admitting: Pediatrics

## 2024-07-20 ENCOUNTER — Encounter: Payer: Self-pay | Admitting: Pediatrics

## 2024-07-20 VITALS — BP 166/66 | HR 69 | Temp 97.3°F | Resp 14 | Ht 62.0 in | Wt 147.0 lb

## 2024-07-20 DIAGNOSIS — K219 Gastro-esophageal reflux disease without esophagitis: Secondary | ICD-10-CM

## 2024-07-20 DIAGNOSIS — K222 Esophageal obstruction: Secondary | ICD-10-CM | POA: Diagnosis not present

## 2024-07-20 DIAGNOSIS — K259 Gastric ulcer, unspecified as acute or chronic, without hemorrhage or perforation: Secondary | ICD-10-CM

## 2024-07-20 DIAGNOSIS — R1319 Other dysphagia: Secondary | ICD-10-CM

## 2024-07-20 DIAGNOSIS — K449 Diaphragmatic hernia without obstruction or gangrene: Secondary | ICD-10-CM

## 2024-07-20 DIAGNOSIS — G4733 Obstructive sleep apnea (adult) (pediatric): Secondary | ICD-10-CM | POA: Diagnosis not present

## 2024-07-20 DIAGNOSIS — K2970 Gastritis, unspecified, without bleeding: Secondary | ICD-10-CM | POA: Diagnosis not present

## 2024-07-20 DIAGNOSIS — K2950 Unspecified chronic gastritis without bleeding: Secondary | ICD-10-CM | POA: Diagnosis not present

## 2024-07-20 DIAGNOSIS — Q399 Congenital malformation of esophagus, unspecified: Secondary | ICD-10-CM | POA: Diagnosis not present

## 2024-07-20 DIAGNOSIS — R1310 Dysphagia, unspecified: Secondary | ICD-10-CM | POA: Diagnosis not present

## 2024-07-20 DIAGNOSIS — I10 Essential (primary) hypertension: Secondary | ICD-10-CM | POA: Diagnosis not present

## 2024-07-20 MED ORDER — SODIUM CHLORIDE 0.9 % IV SOLN: 500.0000 mL | Freq: Once | INTRAVENOUS | Status: DC

## 2024-07-20 NOTE — Op Note (Signed)
 [facility] Patient Name: Tracey [name] Procedure Date: 07/20/2024 3:48 PM MRN: [id number] Endoscopist: Inocente Hausen , MD, [id number] Age: 78 Referring MD:  Date of Birth: [birth year] Gender: Female Account #: [id number] Procedure:                Upper GI endoscopy Indications:              Dysphagia, Follow-up of gastric ulcer with                            hemorrhage 05/2024, Follow-up of hiatal hernia,                            History of Cameron's erosions Medicines:                Monitored Anesthesia Care Procedure:                Pre-Anesthesia Assessment:                           - Prior to the procedure, a History and Physical                            was performed, and patient medications and                            allergies were reviewed. The patient's tolerance of                            previous anesthesia was also reviewed. The risks                            and benefits of the procedure and the sedation                            options and risks were discussed with the patient.                            All questions were answered, and informed consent                            was obtained. Prior Anticoagulants: The patient has                            taken Plavix  (clopidogrel ), last dose was 7 days                            prior to procedure. ASA Grade Assessment: III - A                            patient with severe systemic disease. After                            reviewing the risks and benefits, the patient was  deemed in satisfactory condition to undergo the                            procedure.                           After obtaining informed consent, the endoscope was                            passed under direct vision. Throughout the                            procedure, the patient's blood pressure, pulse, and                            oxygen  saturations were monitored continuously. The                             GIF [id number] #[id number] was introduced through the                            mouth, and advanced to the second part of duodenum.                            The upper GI endoscopy was accomplished without                            difficulty. The patient tolerated the procedure                            well. Scope In: Scope Out: Findings:                 The upper third of the esophagus and middle third                            of the esophagus were normal.                           The distal esophagus was tortuous resulting in                            narrowing of the esophageal lumen                           A moderate Schatzki ring was found in the lower                            third of the esophagus. A TTS dilator was passed                            through the scope. Dilation with a 15-16.5-18 mm                            balloon dilator was performed to 16.5  mm.                           A single localized erosion with no stigmata of                            recent bleeding was found in the gastric body. This                            was present at the site of previous ulcer. Two                            endoscopic hemoclips remained intact from previous                            EGD 05/2024.                           The gastric antrum, cardia (on retroflexion) and                            gastric fundus (on retroflexion) were normal.                            Biopsies were taken with a cold forceps for                            Helicobacter pylori testing from the gastric body                            and antrum.                           A medium-sized hiatal hernia was present.                           The duodenal bulb and second portion of the                            duodenum were normal. Complications:            No immediate complications. Estimated blood loss:                            Minimal. Estimated Blood Loss:      Estimated blood loss was minimal. Impression:               - Normal upper third of esophagus and middle third                            of esophagus.                           - Moderate Schatzki ring. Dilated.                           - Gastric  erosion with no stigmata of recent                            bleeding. This was present at the site of prior                            ulcer and reflected residual healing. Two                            hemostatic clips remained intact.                           - Normal antrum, cardia and gastric fundus.                            Biopsied.                           - Medium-sized hiatal hernia.                           - Normal duodenal bulb and second portion of the                            duodenum.                           - Suspect etiology of dysphagia is multifactorial                            including torturous distal esophagus with some                            degree of presbyesophagus, hiatal hernia and                            possible dysmotility. Recommendation:           - Discharge patient to home (ambulatory).                           - Await pathology results.                           - May resume Plavix  07/21/2024                           - If dysphagia persists despite repeat dilation                            suggest trialing 1-2 Altoid tablets 3 times daily                            before meals for possible esophageal                            spasm/dysmotility contributing to dysphagia.                           -  Continue to avoid/limit NSAIDs.                           - Schedule clinic visit with Dr. Charlanne or his APP                            Oscar Coombs or [facility] May) in 4-6 months.                           - The findings and recommendations were discussed                            with the patient's family.                           - Patient has a contact number available for                             emergencies. The signs and symptoms of potential                            delayed complications were discussed with the                            patient. Return to normal activities tomorrow.                            Written discharge instructions were provided to the                            patient. Inocente Hausen, MD 07/20/2024 4:35:21 PM This report has been signed electronically.

## 2024-07-20 NOTE — Patient Instructions (Addendum)
 Resume previous diet Continue present medications RESUME PLAVIX  AT PREVIOUS DOSE ON FRIDAY 07/21/24 If dysphagia persists, trial taking 1-2 altoid peppermint tablets 3 times a day before meals Continue to avoid all NSAIDS (non-steroidal antiinflammatory drugs) such as aspirin , ibuprofen, aleve, etc, tylenol  is ok to take as needed Await pathology results Schedule clinic visit in 4-6 months with Dr Charlanne, Alan or Deanna  Handouts/information given for HIATAL HERNIA and dysphagia  YOU HAD AN ENDOSCOPIC PROCEDURE TODAY AT THE [facility]:   Refer to the procedure report that was given to you for any specific questions about what was found during the examination.  If the procedure report does not answer your questions, please call your gastroenterologist to clarify.  If you requested that your care partner not be given the details of your procedure findings, then the procedure report has been included in a sealed envelope for you to review at your convenience later.  YOU SHOULD EXPECT: Some feelings of bloating in the abdomen. Passage of more gas than usual.  Walking can help get rid of the air that was put into your GI tract during the procedure and reduce the bloating. If you had a lower endoscopy (such as a colonoscopy or flexible sigmoidoscopy) you may notice spotting of blood in your stool or on the toilet paper. If you underwent a bowel prep for your procedure, you may not have a normal bowel movement for a few days.  Please Note:  You might notice some irritation and congestion in your nose or some drainage.  This is from the oxygen  used during your procedure.  There is no need for concern and it should clear up in a day or so.  SYMPTOMS TO REPORT IMMEDIATELY:  Following upper endoscopy (EGD)  Vomiting of blood or coffee ground material  New chest pain or pain under the shoulder blades  Painful or persistently difficult swallowing  New shortness of breath  Fever of 100F or  higher  Black, tarry-looking stool For urgent or emergent issues, a gastroenterologist can be reached at any hour by calling [phone]. Do not use MyChart messaging for urgent concerns.   DIET:  We do recommend a small meal at first, but then you may proceed to your regular diet.  Drink plenty of fluids but you should avoid alcoholic beverages for 24 hours.  ACTIVITY:  You should plan to take it easy for the rest of today and you should NOT DRIVE or use heavy machinery until tomorrow (because of the sedation medicines used during the test).    FOLLOW UP: Our staff will call the number listed on your records the next business day following your procedure.  We will call around 7:15- 8:00 am to check on you and address any questions or concerns that you may have regarding the information given to you following your procedure. If we do not reach you, we will leave a message.     If any biopsies were taken you will be contacted by phone or by letter within the next 1-3 weeks.  Please call us  at [phone] if you have not heard about the biopsies in 3 weeks.   SIGNATURES/CONFIDENTIALITY: You and/or your care partner have signed paperwork which will be entered into your electronic medical record.  These signatures attest to the fact that that the information above on your After Visit Summary has been reviewed and is understood.  Full responsibility of the confidentiality of this discharge information lies with you and/or your  care-partner.

## 2024-07-20 NOTE — Progress Notes (Signed)
  Gastroenterology History and Physical   Primary Care Physician:  Larnell Hamilton, MD   Reason for Procedure: Follow-up of gastric ulcer, dysphagia, hiatal hernia, history of Cameron's erosions    Plan:    Upper endoscopy with dilation   The patient was provided an opportunity to ask questions and all were answered. The patient agreed with the plan.   HPI: Tracey [name] is a 78 y.o. female undergoing undergoing upper endoscopy with dilation for follow-up of gastric ulcer, dysphagia, hiatal hernia and history of Cameron's erosions.  Patient has had a history of dysphagia related to Schatzki's ring status post balloon dilation to 15 mm in June 2025.    She was admitted to the hospital in October 2025 for melena and posthemorrhagic anemia.  Diagnosed with a gastric ulcer.  Esophageal dilation was not performed at that time due to the fact that the patient had not been off of Plavix  for a sufficient amount of days.   Last dose of Plavix  7 days ago  Past Medical History:  Diagnosis Date   Allergy    Arthritis    Breast cancer (HCC) 2014   ER+/PR+/HEr2-,    Bruises easily    Chronic kidney disease due to hypertension 05/25/2024   Colon polyps    2 polyps by report   Coronary artery disease involving native coronary artery of native heart without angina pectoris 01/01/2014   NSTEMI in January 2019 in [place] >> s/p DES x2 to the LAD (procedure complicated by stent thrombosis) Myoview  05/2020: EF 72, no infarct or ischemia; low risk Echocardiogram 05/2019: EF 60-65, GLS -21.5 Cardiac catheterization at [facility] in 08/2017: Normal left main, normal LCx, normal RCA; LAD/diagonal 95%>> PCI    Diverticulosis    GERD (gastroesophageal reflux disease)    occasionally takes Nexium     Gout    History of bladder infections    many yrs ago   History of bronchitis    last time at least 70yrs ago   History of hiatal hernia    History of stress incontinence     Hyperlipidemia    takes Pravastatin  daily   Hypertension    Insomnia    takes Ambien nightly prn   NSTEMI (non-ST elevated myocardial infarction) (HCC) 08/2017   OSA (obstructive sleep apnea)    on CPAP   Osteoarthritis    Peripheral edema    takes Furosemide  daily prn   Pneumonia    PONV (postoperative nausea and vomiting)    Postmenopausal hormone therapy    Radiation 07/27/13-09/07/13   Right Breast x 31 treatments   Rhinitis    uses Flonase  prn   Sinus congestion    Status post breast reconstruction 10/15/2014   Bilateral implant removal and DIEP performed in [place], [place]   Tachycardia    takes Metoprolol  daily   Ventricular tachycardia, non-sustained (HCC)    during sleep study 2009 with normal cardiac workup    Past Surgical History:  Procedure Laterality Date   ABDOMINAL HYSTERECTOMY     with BSO   APPENDECTOMY     ARTERY BIOPSY Left 05/15/2022   Procedure: BIOPSY TEMPORAL ARTERY;  Surgeon: Gretta Lonni PARAS, MD;  Location: [facility] OR;  Service: Vascular;  Laterality: Left;   AXILLARY SENTINEL NODE BIOPSY Right 05/23/2013   Procedure: AXILLARY SENTINEL NODE BIOPSY;  Surgeon: Krystal PARAS Russell, MD;  Location: [facility] OR;  Service: General;  Laterality: Right;  nuc med injection 7:00   BALLOON DILATION N/A 02/08/2024  Procedure: BALLOON DILATION;  Surgeon: Charlanne Groom, MD;  Location: [facility];  Service: Gastroenterology;  Laterality: N/A;   BREAST RECONSTRUCTION WITH PLACEMENT OF TISSUE EXPANDER AND FLEX HD (ACELLULAR HYDRATED DERMIS) Bilateral 05/23/2013   Procedure: BILATERAL BREAST RECONSTRUCTION WITH PLACEMENT OF TISSUE EXPANDER AND FLEX HD;  Surgeon: Alm Sick, MD;  Location: [facility] OR;  Service: Plastics;  Laterality: Bilateral;   CARDIAC CATHETERIZATION  1999   CATARACT EXTRACTION, BILATERAL Bilateral    CHOLECYSTECTOMY     COSMETIC SURGERY     ESOPHAGOGASTRODUODENOSCOPY N/A 02/08/2024   Procedure: EGD (ESOPHAGOGASTRODUODENOSCOPY);  Surgeon: Charlanne Groom, MD;   Location: [facility];  Service: Gastroenterology;  Laterality: N/A;   ESOPHAGOGASTRODUODENOSCOPY N/A 05/17/2024   Procedure: EGD (ESOPHAGOGASTRODUODENOSCOPY);  Surgeon: Suzann Inocente HERO, MD;  Location: [facility];  Service: Gastroenterology;  Laterality: N/A;   EXPLORATORY LAPAROTOMY     OTHER SURGICAL HISTORY  09/2017   had cyst removal from spine    PERCUTANEOUS CORONARY STENT INTERVENTION (PCI-S)  08/2017   done in [place]      REMOVAL OF BILATERAL TISSUE EXPANDERS WITH PLACEMENT OF BILATERAL BREAST IMPLANTS Bilateral 05/17/2014   Procedure: REMOVAL OF BILATERAL TISSUE EXPANDERS WITH PLACEMENT OF BILATERAL BREAST IMPLANTS FOR RECONSTRUCTION;  Surgeon: Alm Sick, MD;  Location: [facility] OR;  Service: Plastics;  Laterality: Bilateral;   REVERSE SHOULDER ARTHROPLASTY Right 05/13/2023   Procedure: REVERSE SHOULDER ARTHROPLASTY;  Surgeon: Melita Drivers, MD;  Location: [facility];  Service: Orthopedics;  Laterality: Right;   TONSILLECTOMY     TOTAL KNEE ARTHROPLASTY Right 07/26/2018   Procedure: TOTAL KNEE ARTHROPLASTY;  Surgeon: Ernie Cough, MD;  Location: [facility];  Service: Orthopedics;  Laterality: Right;  70 mins   TOTAL KNEE ARTHROPLASTY Left 12/02/2021   Procedure: TOTAL KNEE ARTHROPLASTY;  Surgeon: Ernie Cough, MD;  Location: [facility];  Service: Orthopedics;  Laterality: Left;   TOTAL MASTECTOMY Bilateral 05/23/2013   Procedure: TOTAL MASTECTOMY;  Surgeon: Krystal JINNY Russell, MD;  Location: [facility] OR;  Service: General;  Laterality: Bilateral;    Prior to Admission medications   Medication Sig Start Date End Date Taking? Authorizing Provider  allopurinol  (ZYLOPRIM ) 100 MG tablet TAKE 1 TABLET BY MOUTH ONCE DAILY 06/25/16   Magrinat, Sandria BROCKS, MD  Alpha-Lipoic Acid 300 MG TABS Take 1 tablet (300 mg total) by mouth 2 (two) times daily. 06/06/24   Dohmeier, Dedra, MD  ALPRAZolam  (XANAX ) 0.25 MG tablet Take 1 tablet (0.25 mg total) by mouth at bedtime as needed for anxiety or sleep (in the sleep lab  for pre med, take one tab po  at bedtime). 06/06/24   Dohmeier, Dedra, MD  Budeson-Glycopyrrol-Formoterol  (BREZTRI  AEROSPHERE) 160-9-4.8 MCG/ACT AERO Inhale 2 puffs into the lungs in the morning and at bedtime. Patient taking differently: Inhale 2 puffs into the lungs as needed (wheezing/SOB). 06/19/22   Darlean Ozell NOVAK, MD  Cholecalciferol  (VITAMIN D3) 50 MCG (2000 UT) TABS Take 2,000 Units by mouth daily.    [provider]  clopidogrel  (PLAVIX ) 75 MG tablet TAKE 1 TABLET(75 MG) BY MOUTH DAILY 07/08/23   Wonda Ozell, MD  cyanocobalamin  (,VITAMIN B-12,) 1000 MCG/ML injection Inject 1,000 mcg into the muscle every 30 (thirty) days.    [provider]  estradiol (ESTRACE) 0.1 MG/GM vaginal cream Place 1 Applicatorful vaginally 2 (two) times a week.    [provider]  fluticasone  (FLONASE ) 50 MCG/ACT nasal spray Place 2 sprays into both nostrils daily. 01/18/13   [provider]  folic acid  (FOLVITE ) 1 MG tablet Take 1 mg  by mouth daily.    [provider]  furosemide  (LASIX ) 40 MG tablet Take 40 mg by mouth daily as needed for fluid.  04/01/13   [provider]  hydroxychloroquine  (PLAQUENIL ) 200 MG tablet Take 200 mg by mouth daily.    [provider]  hyoscyamine  (LEVSIN  SL) 0.125 MG SL tablet Place 1 tablet (0.125 mg total) under the tongue every 6 (six) hours as needed for cramping (esophageal spasm). 06/28/24   Craig Alan SAUNDERS, PA-C  ketoconazole  (NIZORAL ) 2 % shampoo Apply 1 Application topically as needed. 08/24/23   [provider]  lidocaine  (LIDODERM ) 5 % Place 1 patch onto the skin daily. Remove & Discard patch within 12 hours or as directed by MD Patient taking differently: Place 1 patch onto the skin daily as needed. Remove & Discard patch within 12 hours or as directed by MD 12/25/23   Neysa Caron PARAS, DO  loratadine  (CLARITIN ) 10 MG tablet Take 10 mg by mouth daily.    [provider]  metoprolol  succinate  (TOPROL -XL) 25 MG 24 hr tablet TAKE 1 TABLET(25 MG) BY MOUTH DAILY 12/10/23   Cooper, Michael, MD  Multiple Vitamin (MULTI-VITAMIN) tablet Take 1 tablet by mouth daily.    [provider]  MYRBETRIQ  50 MG TB24 tablet Take 1 tablet by mouth daily.    [provider]  nitroGLYCERIN  (NITROSTAT ) 0.4 MG SL tablet Place 1 tablet (0.4 mg total) under the tongue every 5 (five) minutes as needed for chest pain. 09/29/23   Wonda Sharper, MD  ORENCIA CLICKJECT 125 MG/ML SOAJ Inject 125 mg into the skin once a week. 12/13/23   [provider]  pantoprazole  (PROTONIX ) 40 MG tablet Take 1 tablet (40 mg total) by mouth 2 (two) times daily. 05/18/24   Drusilla Sabas RAMAN, MD  polyethylene glycol (MIRALAX  / GLYCOLAX ) 17 g packet Take 17 g by mouth daily as needed for mild constipation. 12/03/21   Patti Rosina SAUNDERS, PA-C  predniSONE  (DELTASONE ) 1 MG tablet Take 2 mg by mouth daily with breakfast.    [provider]  Propylene Glycol (SYSTANE COMPLETE) 0.6 % SOLN Place 2 drops into both eyes 2 (two) times daily.    [provider]  rosuvastatin  (CRESTOR ) 20 MG tablet TAKE 1 TABLET(20 MG) BY MOUTH DAILY 07/06/24   Wonda Sharper, MD  sertraline  (ZOLOFT ) 100 MG tablet Take 0.5 tablets (50 mg total) by mouth daily. 12/27/23   Dohmeier, Dedra, MD  testosterone  cypionate (DEPOTESTOSTERONE CYPIONATE) 200 MG/ML injection Inject 200 mg into the muscle every 30 (thirty) days. 01/17/19   [provider]  traMADol  (ULTRAM ) 50 MG tablet Take 50-100 mg by mouth 2 (two) times daily as needed.    [provider]  traZODone  (DESYREL ) 50 MG tablet Take 25 mg by mouth at bedtime as needed. 05/24/24   [provider]    Current Outpatient Medications  Medication Sig Dispense Refill   allopurinol  (ZYLOPRIM ) 100 MG tablet TAKE 1 TABLET BY MOUTH ONCE DAILY 90 tablet 0   ALPRAZolam  (XANAX ) 0.25 MG tablet Take 1 tablet (0.25 mg total) by mouth at bedtime as needed for anxiety or  sleep (in the sleep lab for pre med, take one tab po  at bedtime). 5 tablet 0   apraclonidine  (IOPIDINE ) 0.5 % ophthalmic solution 1 drop daily.     estradiol (ESTRACE) 0.1 MG/GM vaginal cream Place 1 Applicatorful vaginally 2 (two) times a week.     fluticasone  (FLONASE ) 50 MCG/ACT nasal spray Place 2  sprays into both nostrils daily.     folic acid  (FOLVITE ) 1 MG tablet Take 1 mg by mouth daily.     hydroxychloroquine  (PLAQUENIL ) 200 MG tablet Take 200 mg by mouth daily.     hyoscyamine  (LEVSIN  SL) 0.125 MG SL tablet Place 1 tablet (0.125 mg total) under the tongue every 6 (six) hours as needed for cramping (esophageal spasm). 30 tablet 1   ketoconazole  (NIZORAL ) 2 % shampoo Apply 1 Application topically as needed.     metoprolol  succinate (TOPROL -XL) 25 MG 24 hr tablet TAKE 1 TABLET(25 MG) BY MOUTH DAILY 90 tablet 1   MYRBETRIQ  50 MG TB24 tablet Take 1 tablet by mouth daily.     nitroGLYCERIN  (NITROSTAT ) 0.4 MG SL tablet Place 1 tablet (0.4 mg total) under the tongue every 5 (five) minutes as needed for chest pain. 25 tablet 7   pantoprazole  (PROTONIX ) 40 MG tablet Take 1 tablet (40 mg total) by mouth 2 (two) times daily. 60 tablet 3   polyethylene glycol (MIRALAX  / GLYCOLAX ) 17 g packet Take 17 g by mouth daily as needed for mild constipation. 14 each 0   predniSONE  (DELTASONE ) 1 MG tablet Take 2 mg by mouth daily with breakfast.     rosuvastatin  (CRESTOR ) 20 MG tablet TAKE 1 TABLET(20 MG) BY MOUTH DAILY 90 tablet 3   sertraline  (ZOLOFT ) 100 MG tablet Take 0.5 tablets (50 mg total) by mouth daily. 90 tablet 1   traMADol  (ULTRAM ) 50 MG tablet 2 tablet Oral twice a day; Duration: 30 days     traZODone  (DESYREL ) 50 MG tablet Take 25 mg by mouth at bedtime as needed.     Alpha-Lipoic Acid 300 MG TABS Take 1 tablet (300 mg total) by mouth 2 (two) times daily.     Budeson-Glycopyrrol-Formoterol  (BREZTRI  AEROSPHERE) 160-9-4.8 MCG/ACT AERO Inhale 2 puffs into the lungs in the morning and at bedtime.  (Patient taking differently: Inhale 2 puffs into the lungs as needed (wheezing/SOB).) 5.9 g 0   Cholecalciferol  (VITAMIN D3) 50 MCG (2000 UT) TABS Take 2,000 Units by mouth daily.     clopidogrel  (PLAVIX ) 75 MG tablet TAKE 1 TABLET(75 MG) BY MOUTH DAILY 90 tablet 3   cyanocobalamin  (,VITAMIN B-12,) 1000 MCG/ML injection Inject 1,000 mcg into the muscle every 30 (thirty) days.     furosemide  (LASIX ) 40 MG tablet Take 40 mg by mouth daily as needed for fluid.      lidocaine  (LIDODERM ) 5 % Place 1 patch onto the skin daily. Remove & Discard patch within 12 hours or as directed by MD (Patient taking differently: Place 1 patch onto the skin daily as needed. Remove & Discard patch within 12 hours or as directed by MD) 9 patch 0   loratadine  (CLARITIN ) 10 MG tablet Take 10 mg by mouth daily.     Multiple Vitamin (MULTI-VITAMIN) tablet Take 1 tablet by mouth daily.     ORENCIA CLICKJECT 125 MG/ML SOAJ Inject 125 mg into the skin once a week.     predniSONE  (DELTASONE ) 5 MG tablet TAKE 1 TABLET BY MOUTH EVERY MORNING WITH FOOD OR MILK     Propylene Glycol (SYSTANE COMPLETE) 0.6 % SOLN Place 2 drops into both eyes 2 (two) times daily.     testosterone  cypionate (DEPOTESTOSTERONE CYPIONATE) 200 MG/ML injection Inject 200 mg into the muscle every 30 (thirty) days.     Current Facility-Administered Medications  Medication Dose Route Frequency Provider Last Rate Last Admin   0.9 %  sodium chloride  infusion  500 mL Intravenous Once Suzann Inocente HERO, MD        Allergies as of 07/20/2024 - Review Complete 07/20/2024  Allergen Reaction Noted   Iodine  Hives and Swelling 07/28/2016   Pseudoeph-hydrocodone -gg Other (See Comments) 02/03/2019   Codeine Nausea Only 02/10/2010   Hydrocodone  Nausea Only 05/25/2024   Ivp dye [iodinated contrast media] Hives 06/09/2012   Methocarbamol  Other (See Comments) 12/17/2023   Oxycodone  Other (See Comments) 12/30/2021   Shellfish protein-containing drug products Other (See  Comments) 12/17/2023   Sulfa antibiotics Swelling 06/09/2012   Benadryl  [diphenhydramine ] Other (See Comments) 05/15/2024   Cyclobenzaprine  Nausea Only 05/25/2024   Tape Other (See Comments) and Dermatitis 07/04/2018   Tizanidine Other (See Comments) 05/25/2024    Family History  Problem Relation Age of Onset   Breast cancer Mother        possible inflammatory breast cancer   Heart attack Father    Heart disease Father    ALS Sister    Breast cancer Maternal Aunt        dx in her 79s   Breast cancer Maternal Grandmother 28   Heart attack Maternal Grandfather    Other Paternal Grandmother        ruptured appendix   Heart attack Paternal Grandfather    Thyroid  disease Daughter    Breast cancer Other        maternal great grandmother; dx in her 48s   Colon cancer Neg Hx    Esophageal cancer Neg Hx    Rectal cancer Neg Hx    Stomach cancer Neg Hx     Social History   Socioeconomic History   Marital status: Married    Spouse name: Not on file   Number of children: 2   Years of education: Not on file   Highest education level: Not on file  Occupational History   Occupation: retired  Tobacco Use   Smoking status: Former    Current packs/day: 1.00    Average packs/day: 1 pack/day for 32.1 years (32.1 ttl pk-yrs)    Types: Cigarettes    Start date: 06/09/1992   Smokeless tobacco: Never   Tobacco comments:    quit at age 62  Vaping Use   Vaping status: Never Used  Substance and Sexual Activity   Alcohol  use: Yes    Alcohol /week: 1.0 standard drink of alcohol     Types: 1 Standard drinks or equivalent per week    Comment: 1 a day   Drug use: Never   Sexual activity: Yes    Birth control/protection: Surgical  Other Topics Concern   Not on file  Social History Narrative   Not on file   Social Drivers of Health   Financial Resource Strain: Not on file  Food Insecurity: No Food Insecurity (05/16/2024)   Hunger Vital Sign    Worried About Running Out of Food in the  Last Year: Never true    Ran Out of Food in the Last Year: Never true  Transportation Needs: No Transportation Needs (05/16/2024)   PRAPARE - Administrator, Civil Service (Medical): No    Lack of Transportation (Non-Medical): No  Physical Activity: Not on file  Stress: Not on file  Social Connections: Moderately Integrated (05/16/2024)   Social Connection and Isolation Panel    Frequency of Communication with Friends and Family: More than three times a week    Frequency of Social Gatherings with Friends and Family: Three times a week    Attends Religious  Services: Never    Active Member of Clubs or Organizations: Yes    Attends Engineer, Structural: More than 4 times per year    Marital Status: Married  Catering Manager Violence: Not At Risk (05/16/2024)   Humiliation, Afraid, Rape, and Kick questionnaire    Fear of Current or Ex-Partner: No    Emotionally Abused: No    Physically Abused: No    Sexually Abused: No    Review of Systems:  All other review of systems negative except as mentioned in the HPI.  Physical Exam: Vital signs BP (!) 143/66   Pulse 98   Temp (!) 97.3 F (36.3 C)   Ht 5' 2 (1.575 m)   Wt 147 lb (66.7 kg)   LMP 08/17/1977 (Approximate)   SpO2 97%   BMI 26.89 kg/m   General:   Alert,  Well-developed, well-nourished, pleasant and cooperative in NAD Airway:  Mallampati 2 Lungs:  Clear throughout to auscultation.   Heart:  Regular rate and rhythm; no murmurs, clicks, rubs,  or gallops. Abdomen:  Soft, nontender and nondistended. Normal bowel sounds.   Neuro/Psych:  Normal mood and affect. A and O x 3  Inocente Hausen, MD [facility] Gastroenterology

## 2024-07-20 NOTE — Progress Notes (Signed)
 Vss nad trans to pacu

## 2024-07-21 ENCOUNTER — Telehealth: Payer: Self-pay

## 2024-07-21 NOTE — Telephone Encounter (Signed)
  Follow up Call-     07/20/2024    3:31 PM  Call back number  Post procedure Call Back phone  # [phone]  Permission to leave phone message Yes     Patient questions:  Do you have a fever, pain , or abdominal swelling? No. Pain Score  0 *  Have you tolerated food without any problems? Yes.    Have you been able to return to your normal activities? Yes.    Do you have any questions about your discharge instructions: Diet   No. Medications  No. Follow up visit  No.  Do you have questions or concerns about your Care? No.  Actions: * If pain score is 4 or above: No action needed, pain <4.

## 2024-07-24 DIAGNOSIS — Z79890 Hormone replacement therapy (postmenopausal): Secondary | ICD-10-CM | POA: Diagnosis not present

## 2024-07-24 DIAGNOSIS — L720 Epidermal cyst: Secondary | ICD-10-CM | POA: Diagnosis not present

## 2024-07-24 DIAGNOSIS — R6882 Decreased libido: Secondary | ICD-10-CM | POA: Diagnosis not present

## 2024-07-24 NOTE — Telephone Encounter (Addendum)
 Call back from Honor with [facility] received. Per Izetta, need clarification if pt is to have 100mg  monthly or 200mg  monthly. Per OV note, dose not clarified; Rx to be sent to [facility]. Call placed to pt to clarify dose she is currently taking and pharmacy. No answer, LMOVM. Heidi, NP consulted and agreeable.

## 2024-07-26 ENCOUNTER — Ambulatory Visit: Payer: Self-pay | Admitting: Pediatrics

## 2024-07-26 LAB — SURGICAL PATHOLOGY

## 2024-07-31 ENCOUNTER — Ambulatory Visit

## 2024-09-03 ENCOUNTER — Other Ambulatory Visit: Payer: Self-pay | Admitting: Cardiovascular Disease

## 2024-09-11 ENCOUNTER — Telehealth: Payer: Self-pay

## 2024-09-11 NOTE — Telephone Encounter (Signed)
 Auth Submission: NO AUTH NEEDED Site of care: Site of care: [facility] Payer: Medicare A/B with [employer] Medication & CPT/J Code(s) submitted: Prolia  (Denosumab ) [id number] Diagnosis Code:  Route of submission (phone, fax, portal):  Phone # Fax # Auth type: Buy/Bill PB Units/visits requested: 60mg  x 2 doses Reference number:  Approval from: 09/11/24 to 09/16/25   Patient wants to stay at [facility], she lives closer. She is currently in [place]  and will get her Prolia  there. When she comes back to [facility] in April, she will call and schedule an appointment.

## 2024-09-19 ENCOUNTER — Telehealth: Payer: Self-pay | Admitting: Cardiovascular Disease

## 2024-09-19 DIAGNOSIS — I25118 Atherosclerotic heart disease of native coronary artery with other forms of angina pectoris: Secondary | ICD-10-CM

## 2024-09-19 MED ORDER — CLOPIDOGREL BISULFATE 75 MG PO TABS: 75.0000 mg | ORAL_TABLET | Freq: Every day | ORAL | 3 refills | Status: AC

## 2024-09-19 NOTE — Telephone Encounter (Signed)
 REFILL SENT

## 2024-09-27 ENCOUNTER — Ambulatory Visit: Admitting: Gastroenterology

## 2025-04-11 ENCOUNTER — Inpatient Hospital Stay: Admitting: Adult Health
